# Patient Record
Sex: Female | Born: 1965
Health system: Southern US, Community
[De-identification: ages and names within clinical notes are randomized; demographics above are authoritative.]

## PROBLEM LIST (undated history)

## (undated) ENCOUNTER — Emergency Department (HOSPITAL_COMMUNITY): Admission: EM | Payer: BC Managed Care – PPO

## (undated) DIAGNOSIS — E1140 Type 2 diabetes mellitus with diabetic neuropathy, unspecified: Secondary | ICD-10-CM

## (undated) DIAGNOSIS — N186 End stage renal disease: Secondary | ICD-10-CM

## (undated) DIAGNOSIS — Z8489 Family history of other specified conditions: Secondary | ICD-10-CM

## (undated) DIAGNOSIS — I340 Nonrheumatic mitral (valve) insufficiency: Secondary | ICD-10-CM

## (undated) DIAGNOSIS — I10 Essential (primary) hypertension: Secondary | ICD-10-CM

## (undated) DIAGNOSIS — R55 Syncope and collapse: Secondary | ICD-10-CM

## (undated) DIAGNOSIS — R011 Cardiac murmur, unspecified: Secondary | ICD-10-CM

## (undated) DIAGNOSIS — K219 Gastro-esophageal reflux disease without esophagitis: Secondary | ICD-10-CM

## (undated) DIAGNOSIS — H543 Unqualified visual loss, both eyes: Secondary | ICD-10-CM

## (undated) DIAGNOSIS — K529 Noninfective gastroenteritis and colitis, unspecified: Secondary | ICD-10-CM

## (undated) HISTORY — PX: CHOLECYSTECTOMY: SHX55

## (undated) HISTORY — PX: CATARACT EXTRACTION: SUR2

## (undated) HISTORY — PX: TUBAL LIGATION: SHX77

## (undated) HISTORY — PX: EYE SURGERY: SHX253

## (undated) HISTORY — PX: GANGLION CYST EXCISION: SHX1691

## (undated) HISTORY — PX: ABDOMINAL HYSTERECTOMY: SHX81

## (undated) HISTORY — DX: Unqualified visual loss, both eyes: H54.3

## (undated) HISTORY — PX: TONSILLECTOMY: SUR1361

---

## 1997-07-05 HISTORY — PX: ABDOMINAL HYSTERECTOMY: SHX81

## 2007-05-25 ENCOUNTER — Emergency Department (HOSPITAL_COMMUNITY): Admission: EM | Admit: 2007-05-25 | Discharge: 2007-05-25 | Payer: Self-pay | Admitting: Emergency Medicine

## 2007-05-27 ENCOUNTER — Inpatient Hospital Stay (HOSPITAL_COMMUNITY): Admission: EM | Admit: 2007-05-27 | Discharge: 2007-05-31 | Payer: Self-pay | Admitting: Emergency Medicine

## 2007-05-27 ENCOUNTER — Ambulatory Visit: Payer: Self-pay | Admitting: Internal Medicine

## 2007-05-29 ENCOUNTER — Encounter: Payer: Self-pay | Admitting: Internal Medicine

## 2007-06-11 ENCOUNTER — Emergency Department (HOSPITAL_COMMUNITY): Admission: EM | Admit: 2007-06-11 | Discharge: 2007-06-11 | Payer: Self-pay | Admitting: Emergency Medicine

## 2007-06-12 ENCOUNTER — Telehealth: Payer: Self-pay | Admitting: *Deleted

## 2007-06-13 ENCOUNTER — Encounter (INDEPENDENT_AMBULATORY_CARE_PROVIDER_SITE_OTHER): Payer: Self-pay | Admitting: Internal Medicine

## 2007-06-14 ENCOUNTER — Ambulatory Visit (HOSPITAL_COMMUNITY): Admission: RE | Admit: 2007-06-14 | Discharge: 2007-06-14 | Payer: Self-pay | Admitting: Gastroenterology

## 2007-06-20 ENCOUNTER — Ambulatory Visit (HOSPITAL_COMMUNITY): Admission: RE | Admit: 2007-06-20 | Discharge: 2007-06-20 | Payer: Self-pay | Admitting: Gastroenterology

## 2007-07-10 ENCOUNTER — Encounter: Admission: RE | Admit: 2007-07-10 | Discharge: 2007-10-08 | Payer: Self-pay | Admitting: Internal Medicine

## 2007-07-28 ENCOUNTER — Emergency Department (HOSPITAL_COMMUNITY): Admission: EM | Admit: 2007-07-28 | Discharge: 2007-07-28 | Payer: Self-pay | Admitting: Emergency Medicine

## 2007-07-31 ENCOUNTER — Ambulatory Visit (HOSPITAL_COMMUNITY): Admission: RE | Admit: 2007-07-31 | Discharge: 2007-08-01 | Payer: Self-pay | Admitting: General Surgery

## 2007-07-31 ENCOUNTER — Encounter (HOSPITAL_BASED_OUTPATIENT_CLINIC_OR_DEPARTMENT_OTHER): Payer: Self-pay | Admitting: General Surgery

## 2007-08-11 ENCOUNTER — Ambulatory Visit: Payer: Self-pay | Admitting: Internal Medicine

## 2007-08-14 ENCOUNTER — Ambulatory Visit: Payer: Self-pay | Admitting: *Deleted

## 2007-09-25 ENCOUNTER — Ambulatory Visit: Payer: Self-pay | Admitting: Internal Medicine

## 2007-09-25 LAB — CONVERTED CEMR LAB
ALT: 9 units/L (ref 0–35)
AST: 9 units/L (ref 0–37)
Albumin: 4 g/dL (ref 3.5–5.2)
Alkaline Phosphatase: 95 units/L (ref 39–117)
Amphetamine Screen, Ur: NEGATIVE
BUN: 13 mg/dL (ref 6–23)
Barbiturate Quant, Ur: NEGATIVE
Basophils Absolute: 0 10*3/uL (ref 0.0–0.1)
Basophils Relative: 0 % (ref 0–1)
Benzodiazepines.: NEGATIVE
CO2: 23 meq/L (ref 19–32)
Calcium: 8.7 mg/dL (ref 8.4–10.5)
Chloride: 105 meq/L (ref 96–112)
Cocaine Metabolites: NEGATIVE
Creatinine, Ser: 0.65 mg/dL (ref 0.40–1.20)
Creatinine,U: 38.1 mg/dL
Eosinophils Absolute: 0.1 10*3/uL (ref 0.0–0.7)
Eosinophils Relative: 3 % (ref 0–5)
Glucose, Bld: 481 mg/dL — ABNORMAL HIGH (ref 70–99)
HCT: 41.2 % (ref 36.0–46.0)
Hemoglobin: 13.1 g/dL (ref 12.0–15.0)
Lymphocytes Relative: 41 % (ref 12–46)
Lymphs Abs: 2 10*3/uL (ref 0.7–4.0)
MCHC: 31.8 g/dL (ref 30.0–36.0)
MCV: 86.4 fL (ref 78.0–100.0)
Marijuana Metabolite: NEGATIVE
Methadone: NEGATIVE
Microalb, Ur: 4.54 mg/dL — ABNORMAL HIGH (ref 0.00–1.89)
Monocytes Absolute: 0.3 10*3/uL (ref 0.1–1.0)
Monocytes Relative: 6 % (ref 3–12)
Neutro Abs: 2.4 10*3/uL (ref 1.7–7.7)
Neutrophils Relative %: 51 % (ref 43–77)
Opiate Screen, Urine: NEGATIVE
Phencyclidine (PCP): NEGATIVE
Platelets: 197 10*3/uL (ref 150–400)
Potassium: 4.1 meq/L (ref 3.5–5.3)
Propoxyphene: NEGATIVE
RBC: 4.77 M/uL (ref 3.87–5.11)
RDW: 14.2 % (ref 11.5–15.5)
Sodium: 137 meq/L (ref 135–145)
TSH: 1.604 microintl units/mL (ref 0.350–5.50)
Total Bilirubin: 0.4 mg/dL (ref 0.3–1.2)
Total Protein: 6.6 g/dL (ref 6.0–8.3)
WBC: 4.8 10*3/uL (ref 4.0–10.5)

## 2007-09-26 ENCOUNTER — Encounter (INDEPENDENT_AMBULATORY_CARE_PROVIDER_SITE_OTHER): Payer: Self-pay | Admitting: Internal Medicine

## 2007-12-21 ENCOUNTER — Emergency Department (HOSPITAL_COMMUNITY): Admission: EM | Admit: 2007-12-21 | Discharge: 2007-12-22 | Payer: Self-pay | Admitting: Emergency Medicine

## 2008-02-02 ENCOUNTER — Emergency Department (HOSPITAL_COMMUNITY): Admission: EM | Admit: 2008-02-02 | Discharge: 2008-02-02 | Payer: Self-pay | Admitting: Emergency Medicine

## 2008-02-10 ENCOUNTER — Emergency Department (HOSPITAL_COMMUNITY): Admission: EM | Admit: 2008-02-10 | Discharge: 2008-02-10 | Payer: Self-pay | Admitting: Emergency Medicine

## 2008-02-22 ENCOUNTER — Ambulatory Visit: Payer: Self-pay | Admitting: Obstetrics & Gynecology

## 2008-04-04 ENCOUNTER — Emergency Department (HOSPITAL_COMMUNITY): Admission: EM | Admit: 2008-04-04 | Discharge: 2008-04-05 | Payer: Self-pay | Admitting: Emergency Medicine

## 2008-04-08 ENCOUNTER — Ambulatory Visit: Payer: Self-pay | Admitting: Internal Medicine

## 2008-04-12 ENCOUNTER — Ambulatory Visit: Payer: Self-pay | Admitting: Family Medicine

## 2008-05-18 ENCOUNTER — Emergency Department (HOSPITAL_COMMUNITY): Admission: EM | Admit: 2008-05-18 | Discharge: 2008-05-18 | Payer: Self-pay | Admitting: Emergency Medicine

## 2008-05-24 ENCOUNTER — Ambulatory Visit: Payer: Self-pay | Admitting: Internal Medicine

## 2008-07-06 ENCOUNTER — Emergency Department (HOSPITAL_COMMUNITY): Admission: EM | Admit: 2008-07-06 | Discharge: 2008-07-06 | Payer: Self-pay | Admitting: Emergency Medicine

## 2008-07-26 ENCOUNTER — Ambulatory Visit: Payer: Self-pay | Admitting: Family Medicine

## 2008-07-27 ENCOUNTER — Encounter: Payer: Self-pay | Admitting: Family Medicine

## 2008-07-27 ENCOUNTER — Ambulatory Visit (HOSPITAL_COMMUNITY): Admission: RE | Admit: 2008-07-27 | Discharge: 2008-07-27 | Payer: Self-pay | Admitting: Family Medicine

## 2008-10-16 ENCOUNTER — Emergency Department (HOSPITAL_COMMUNITY): Admission: EM | Admit: 2008-10-16 | Discharge: 2008-10-16 | Payer: Self-pay | Admitting: Emergency Medicine

## 2008-12-01 ENCOUNTER — Ambulatory Visit: Payer: Self-pay | Admitting: Family Medicine

## 2008-12-01 ENCOUNTER — Ambulatory Visit: Payer: Self-pay | Admitting: *Deleted

## 2008-12-01 ENCOUNTER — Inpatient Hospital Stay (HOSPITAL_COMMUNITY): Admission: EM | Admit: 2008-12-01 | Discharge: 2008-12-03 | Payer: Self-pay | Admitting: Emergency Medicine

## 2009-06-12 ENCOUNTER — Emergency Department (HOSPITAL_COMMUNITY): Admission: EM | Admit: 2009-06-12 | Discharge: 2009-06-12 | Payer: Self-pay | Admitting: Emergency Medicine

## 2009-09-09 ENCOUNTER — Observation Stay (HOSPITAL_COMMUNITY): Admission: EM | Admit: 2009-09-09 | Discharge: 2009-09-09 | Payer: Self-pay | Admitting: Emergency Medicine

## 2009-10-13 ENCOUNTER — Inpatient Hospital Stay (HOSPITAL_COMMUNITY): Admission: EM | Admit: 2009-10-13 | Discharge: 2009-10-17 | Payer: Self-pay | Admitting: Emergency Medicine

## 2009-10-19 ENCOUNTER — Emergency Department (HOSPITAL_COMMUNITY): Admission: EM | Admit: 2009-10-19 | Discharge: 2009-10-19 | Payer: Self-pay | Admitting: Emergency Medicine

## 2009-10-20 ENCOUNTER — Ambulatory Visit: Payer: Self-pay | Admitting: Internal Medicine

## 2009-10-20 ENCOUNTER — Inpatient Hospital Stay (HOSPITAL_COMMUNITY): Admission: EM | Admit: 2009-10-20 | Discharge: 2009-10-23 | Payer: Self-pay | Admitting: Pulmonary Disease

## 2009-10-20 ENCOUNTER — Ambulatory Visit: Payer: Self-pay | Admitting: Cardiology

## 2009-10-20 DIAGNOSIS — J90 Pleural effusion, not elsewhere classified: Secondary | ICD-10-CM | POA: Insufficient documentation

## 2009-10-22 ENCOUNTER — Encounter: Payer: Self-pay | Admitting: Pulmonary Disease

## 2009-10-23 ENCOUNTER — Encounter: Payer: Self-pay | Admitting: Emergency Medicine

## 2009-10-31 ENCOUNTER — Ambulatory Visit: Payer: Self-pay | Admitting: Internal Medicine

## 2009-10-31 DIAGNOSIS — I080 Rheumatic disorders of both mitral and aortic valves: Secondary | ICD-10-CM | POA: Insufficient documentation

## 2009-11-04 ENCOUNTER — Ambulatory Visit: Payer: Self-pay | Admitting: Cardiology

## 2009-11-04 ENCOUNTER — Inpatient Hospital Stay (HOSPITAL_COMMUNITY): Admission: EM | Admit: 2009-11-04 | Discharge: 2009-11-07 | Payer: Self-pay | Admitting: Emergency Medicine

## 2009-11-04 ENCOUNTER — Encounter (INDEPENDENT_AMBULATORY_CARE_PROVIDER_SITE_OTHER): Payer: Self-pay | Admitting: Emergency Medicine

## 2009-11-27 ENCOUNTER — Ambulatory Visit: Payer: Self-pay | Admitting: Family Medicine

## 2009-11-28 ENCOUNTER — Encounter: Payer: Self-pay | Admitting: Internal Medicine

## 2009-12-04 ENCOUNTER — Telehealth: Payer: Self-pay | Admitting: Internal Medicine

## 2009-12-10 ENCOUNTER — Encounter: Payer: Self-pay | Admitting: Internal Medicine

## 2010-08-04 NOTE — Assessment & Plan Note (Signed)
Summary: PER BRANDI/PER DR SOOD OVERBOOK/CB   Visit Type:  Follow-up Copy to:  dr Orlie Dakin Primary Provider/Referring Provider:  [facility]  CC:  HFU.  Pt states breathing is better  but not back to baseline.  States she is still having chest tightness, nonprod cough, and and SOB mainly when laying down and when walking upstairs.  .  History of Present Illness: OV 10/31/2009: Followup from hospitalization for right effusion 4/18 through 10/22/2009. In the hospital ultrasound did not show evidence of any pleural effusion worth tapping. She had diagnostic echo on 10/22/2009 and this showed new moderate mitral regurg and related pulmonary hypertension. She was diuresed (although bnp on 4/19 was 70s) and then discharged. Since discharge she states she is overall better but being bothered by new cough. She feels that this cough started during first hspitalization 10/13/2009 for Acute Pyelo. Dry cough. Rates it moderate in severity. STable since onset versus slightly better. Worse when lying down or sleeping or moving around or bending down. Relieved by rest. Associated with orthopnea (needing 3 pillows to sleep since mid-apirl), wheeze and dyspnea and bilateral edema. Denies hemoptysis or chest pain or fever or chills, or GERD or silent post nasal drainge. OV 10/31/2009: Followup from hospitalization for right effusion. In the hospital ultrasound did not show evidence of any pleural effusion worth tapping. She had diagnostic echo on 10/22/2009 and this showed new moderate mitral regurg and related pulmonary hypertension. She was diuresed (although bnp on 4/19 was 70s) and then discharged. Since dischargte  Current Medications (verified): 1)  Novolog 100 Unit/ml Soln (Insulin Aspart) .... Ssi 2)  Lantus 100 Unit/ml Soln (Insulin Glargine) .Marland Kitchen.. 15 Untis At Bedtime 3)  Simvastatin 20 Mg Tabs (Simvastatin) .... Once Daily 4)  Aspir-Low 81 Mg Tbec (Aspirin) .... Once Daily  Allergies (verified): No Known  Drug Allergies  Past History:  Family History: Last updated: 10/20/2009 Allergies, asthma Family History Asthma  Social History: Last updated: 10/20/2009 Patient is a non smoker, non drinker Patient never smoked.  Lives with 43 year old son Systems analyst in [place] - picks orders Denies passive smoking Divorced  Risk Factors: Smoking Status: never (10/20/2009)  Past Medical History: Reviewed history from 10/20/2009 and no changes required.  Insulin-dependent diabetes mellitus.   2. History of ovarian cysts.   3. Hyperlipidemia.   4. Negative Cardiolite stress test in May 2010.   5. Status post laparoscopic cholecystectomy in January 2009 per Dr.       Bubba Camp.   6. Remote hysterectomy.   Past Surgical History: Reviewed history from 10/20/2009 and no changes required. Hysterectomy, Tubal Ligation  Family History: Reviewed history from 10/20/2009 and no changes required. Allergies, asthma Family History Asthma  Social History: Reviewed history from 10/20/2009 and no changes required. Patient is a non smoker, non drinker Patient never smoked.  Lives with 42 year old son Systems analyst in [place] - picks orders Denies passive smoking Divorced  Review of Systems       The patient complains of shortness of breath with activity, non-productive cough, chest pain, irregular heartbeats, and hand/feet swelling.  The patient denies shortness of breath at rest, productive cough, coughing up blood, acid heartburn, indigestion, loss of appetite, weight change, abdominal pain, difficulty swallowing, sore throat, tooth/dental problems, headaches, nasal congestion/difficulty breathing through nose, sneezing, itching, ear ache, anxiety, depression, joint stiffness or pain, rash, change in color of mucus, and fever.    Vital Signs:  Patient profile:   45 year old  female Height:      60 inches Weight:      168.25 pounds BMI:     32.98 O2 Sat:      100  % on Room air Temp:     97.9 degrees F oral Pulse rate:   75 / minute BP sitting:   120 / 84  (left arm) Cuff size:   regular  Vitals Entered By: Raymondo Band RN (October 31, 2009 1:37 PM)  O2 Flow:  Room air CC: HFU.  Pt states breathing is better  but not back to baseline.  States she is still having chest tightness, nonprod cough, and SOB mainly when laying down and when walking upstairs.   Comments Medications reviewed with patient Daytime contact number verified with patient. Raymondo Band RN  October 31, 2009 1:37 PM    Physical Exam  General:  looks better Head:  normocephalic and atraumatic Eyes:  PERRLA/EOM intact; conjunctiva and sclera clear Ears:  TMs intact and clear with normal canals Nose:  no deformity, discharge, inflammation, or lesions Mouth:  no deformity or lesions Neck:  no masses, thyromegaly, or abnormal cervical nodes  Likely that JVP is elevated at 4cm at 45 degrees Chest Wall:  no deformities noted Lungs:  clear bilaterally to auscultation and percussion Heart:  regular rate and rhythm, S1, S2 without rubs, gallops, or clicks Pansystolic murmur present throughout Abdomen:  bowel sounds positive; abdomen soft and non-tender without masses, or organomegaly Msk:  no deformity or scoliosis noted with normal posture Pulses:  pulses normal Extremities:  no clubbing, cyanosis, edema, or deformity noted Neurologic:  CN II-XII grossly intact with normal reflexes, coordination, muscle strength and tone Skin:  intact without lesions or rashes Cervical Nodes:  no significant adenopathy Axillary Nodes:  no significant adenopathy Psych:  alert and cooperative; normal mood and affect; normal attention span and concentration   MISC. Report  Procedure date:  10/21/2009  Findings:      She had diagnostic echo on 10/22/2009 and this showed new moderate mitral regurg and related pulmonary hypertension. She was diuresed (although bnp on 4/19 was 70s  Impression &  Recommendations:  Problem # 1:  COUGH (ICD-786.2) Assessment New  I am not sure why she has cough since mid-apri. She is denying pneumonia or bronchitis symptoms, gERD and sinus drainage. Wonder if related to mitral regurg.   PLAN sgtart empiric nasal steroids for sinus drainage and PPI for gerd get sehv cards to se patient asap reasess in 4 weeks  Orders: Est. Patient Level III [id number])  Problem # 2:  MITRAL REGURGITATION (ICD-396.3) Assessment: New  This is new since 2008. She has complaints of edema although I could not appreciate it. She moight have elevated JVP atlhough BNP on 4/29 was normal. She did respond in hospital to diuresis in  terms of dyspnea. I will get her to see [facility]. She might need more invasive workup. She clearly has pan systolic murmur Her updated medication list for this problem includes:    Aspir-low 81 Mg Tbec (Aspirin) ..... Once daily  Orders: Est. Patient Level III [id number])  Problem # 3:  PLEURAL EFFUSION, RIGHT (ICD-511.9) Assessment: Unchanged Monitoir clinically Orders: Cardiology Referral (Cardiology) Est. Patient Level III [id number])  Medications Added to Medication List This Visit: 1)  Novolog 100 Unit/ml Soln (Insulin aspart) .... Ssi 2)  Aspir-low 81 Mg Tbec (Aspirin) .... Once daily 3)  Zegerid Otc 20-1100 Mg Caps (Omeprazole-sodium bicarbonate) .... Take 1 cap each morning empty stmach  4)  Fluticasone Propionate 50 Mcg/act Susp (Fluticasone propionate) .... 2 squirts each nostril daily  Other Orders: Prescription Created Electronically [phone])  Patient Instructions: 1)  we will call [facility] and get you appoitment to be seen 2)  try nasal steroid 2 squirts each nostril daily x 1 month (take 2 sample) 3)  try acid reflux samples 1 tab each morning on empty stomach for 1 month 4)  return after 1 month Prescriptions: FLUTICASONE PROPIONATE 50 MCG/ACT SUSP (FLUTICASONE PROPIONATE) 2 squirts each nostril daily  #1 x 6    Entered and Authorized by:   Brand Males MD   Signed by:   Brand Males MD on 10/31/2009   Method used:   Print then Give to Patient   [id number] ZEGERID OTC 20-1100 MG CAPS (OMEPRAZOLE-SODIUM BICARBONATE) take 1 cap each morning empty stmach  #30 x 1   Entered and Authorized by:   Brand Males MD   Signed by:   Brand Males MD on 10/31/2009   Method used:   Print then Give to Patient   [id number] FLUTICASONE PROPIONATE 50 MCG/ACT SUSP (FLUTICASONE PROPIONATE) 2 squirts each nostril daily  #1 x 6   Entered and Authorized by:   Brand Males MD   Signed by:   Brand Males MD on 10/31/2009   Method used:   Print then Give to Patient   RxIDFT:[id number] ZEGERID OTC 20-1100 MG CAPS (OMEPRAZOLE-SODIUM BICARBONATE) take 1 cap each morning empty stmach  #30 x 1   Entered and Authorized by:   Brand Males MD   Signed by:   Brand Males MD on 10/31/2009   Method used:   Print then Give to Patient   RxID:   [id number]   Appended Document: PER BRANDI/PER DR SOOD OVERBOOK/CB     Allergies: No Known Drug Allergies   Other Orders: Est. Patient Level III [id number])

## 2010-08-04 NOTE — Letter (Signed)
Summary: [facility]  [address], [place] [address]   Phone: [phone]  Fax: [phone]    12/10/2009  [name] [name] [address], [place]  [address]  Dear [name],      We have been attempting to contact you in regards to a missed appointment that was scheduled on December 03, 2009. We have been unable to reach you to reschedule this appointment. Please contact our office at your earliest convenience to take care of this matter. Thank you.         Sincerely,   Therapist, [facility]

## 2010-08-04 NOTE — Progress Notes (Signed)
Summary: nos appt  Phone Note Call from Patient   Caller: [email or web address]  Call For: Jacquel Mccamish Summary of Call: LMTCB x2 to rsc nos from 6/1. Initial call taken by: Netta Neat,  December 04, 2009 3:45 PM     Appended Document: nos appt send certified letter  Appended Document: nos appt Letter printed and given to Donavan Foil to send certified. Marland Kitchen  Appended Document: nos appt received delivery notice of certified letter. It was delivered on 12-11-09 and was signed for by Omar Person.

## 2010-08-04 NOTE — Assessment & Plan Note (Signed)
Summary: fluid in lungs/ mbw   Visit Type:  ADMIT H AND P Copy to:  dr Orlie Dakin Primary Provider/Referring Provider:  [facility]  CC:  Hospital f/u coughing X 4 days and non productive.  History of Present Illness: IOV 10/20/2009: 45 year old female. Went to ER on /05/2010 with nausea, vomitting and fever with wc 12k/94% poily. Admitted 10/13/2009 - 10/17/2009. Diagnosis was Acute E Colii Rt pyelnoephritis and discharged on 10/17/2009 on Cipro which she was instructed to continue for 1 more week . When she returned home on 10/17/2009 she noticed some new chsest heaviness. On 10/18/2009 developed new dyspnea and dry cough. Dyspnea was brought on by lying down btu relieved by sitting up. Slowly getting worse. Cough and dyspnea are both progressive and rated as moderate-severe. THerefore, reported to ER on 10/19/2009. CT chest ruled out PE but showed RT pleural effusion that is new since CXR 09/09/2009.  Was given lasix x 1 IV in ER and referred to pulmonary today. Feels she is getting worse.  Denies fever, nausea, vomit, diarrhea, chills.  Preventive Screening-Counseling & Management  Alcohol-Tobacco     Smoking Status: never   Current Medications (verified): 1)  Lancets  Misc (Lancets) .... With Each Insulin Use 2)  Novolog 100 Unit/ml Soln (Insulin Aspart)  Allergies (verified): No Known Drug Allergies  Past History:  Past Medical History:  Insulin-dependent diabetes mellitus.   2. History of ovarian cysts.   3. Hyperlipidemia.   4. Negative Cardiolite stress test in May 2010.   5. Status post laparoscopic cholecystectomy in January 2009 per Dr.       Bubba Camp.   6. Remote hysterectomy.   Past Surgical History: Hysterectomy, Tubal Ligation  Family History: Allergies, asthma Family History Asthma  Social History: Patient is a non smoker, non drinker Patient never smoked.  Lives with 85 year old son Systems analyst in [place] - picks orders Denies passive  smoking DivorcedSmoking Status:  never  Review of Systems       The patient complains of shortness of breath at rest and non-productive cough.  The patient denies shortness of breath with activity, productive cough, coughing up blood, chest pain, irregular heartbeats, acid heartburn, indigestion, loss of appetite, weight change, abdominal pain, difficulty swallowing, sore throat, tooth/dental problems, headaches, nasal congestion/difficulty breathing through nose, sneezing, itching, ear ache, anxiety, depression, hand/feet swelling, joint stiffness or pain, rash, change in color of mucus, and fever.    Vital Signs:  Patient profile:   45 year old female Height:      60 inches Weight:      174.13 pounds BMI:     34.13 O2 Sat:      95 % on Room air Temp:     98.2 degrees F oral Pulse rate:   68 / minute BP sitting:   146 / 90  (left arm) Cuff size:   regular  Vitals Entered By: Lyndee Leo, CMA (October 20, 2009 4:32 PM)  O2 Sat at Rest %:  95% O2 Flow:  Room air  Physical Exam  General:  looks fatigued coughs periodically wants to get admitted Head:  normocephalic and atraumatic Eyes:  PERRLA/EOM intact; conjunctiva and sclera clear Ears:  TMs intact and clear with normal canals Nose:  no deformity, discharge, inflammation, or lesions Mouth:  no deformity or lesions Neck:  no masses, thyromegaly, or abnormal cervical nodes Chest Wall:  no deformities noted Lungs:  decreased BS on R and dullness R base.  Heart:  regular rate and rhythm, S1, S2 without murmurs, rubs, gallops, or clicks Abdomen:  bowel sounds positive; abdomen soft and non-tender without masses, or organomegaly Msk:  no deformity or scoliosis noted with normal posture Pulses:  pulses normal Extremities:  no clubbing, cyanosis, edema, or deformity noted Neurologic:  CN II-XII grossly intact with normal reflexes, coordination, muscle strength and tone Skin:  intact without lesions or rashes Cervical Nodes:   no significant adenopathy Axillary Nodes:  no significant adenopathy Psych:  alert and cooperative; normal mood and affect; normal attention span and concentration   Impression & Recommendations:  Problem # 1:  PLEURAL EFFUSION, RIGHT (ICD-511.9) Assessment New Recent UTI. Now new Rt effusion. Symptomatic as a result. ? HAP and post pna effusion, ? reactive due to recent pyelo. REgardless needs diagnostic and thereapeutic relief. She is deconditioned, coughing and dyspneic. She prefers admission. Will admit and arrange for thora under IR guidance. Explained risks of thora (REPE, ptx, hempothorax and vasovagal). She understands. She is nervous about procedure but consents.   She will get admitted by self to the hospital. Prefers [facility] floor  Medications Added to Medication List This Visit: 1)  Novolog 100 Unit/ml Soln (Insulin aspart) 2)  Lantus 100 Unit/ml Soln (Insulin glargine) .Marland Kitchen.. 15 untis at bedtime 3)  Simvastatin 20 Mg Tabs (Simvastatin) .... Once daily 4)  Cipro 500 Mg Tabs (Ciprofloxacin hcl) .... Twice daily

## 2010-08-04 NOTE — Consult Note (Signed)
Summary: GI-Dr. Amedeo Plenty  GI-Dr. Amedeo Plenty   Imported By: Lacy Duverney 07/05/2007 [id number]  _____________________________________________________________________  External Attachment:    Type:   Image     Comment:   External Document

## 2010-08-04 NOTE — Letter (Signed)
Summary: Certified Letter Receipt  Certified Letter Receipt   Imported By: Phillis Knack 01/26/2010 14:36:33  _____________________________________________________________________  External Attachment:    Type:   Image     Comment:   External Document

## 2010-08-04 NOTE — Assessment & Plan Note (Signed)
Summary: rov 1 month////kp   Allergies: No Known Drug Allergies   Appended Document: rov 1 month////kp patient rescheduled becuase she got her appt time mistaken

## 2010-08-04 NOTE — Progress Notes (Signed)
Summary: New pt - needs ER f/up for abd pain  Phone Note Other Incoming   Summary of Call: Called by the ER to set up an ER f/up appt for this pt.  She was seen for abd pain and no objective data supported a diagnosis.  She is to see Dr. Amedeo Plenty (GI) on 06/13/07.  Please schedule an appt for the pt to be seen as a new pt since she's never been seen in our clinic before. Initial call taken by: Rico Sheehan DO,  June 12, 2007 9:56 AM  Follow-up for Phone Call        please refer to [email or web address] or [email or web address] Follow-up by: Freddy Finner RN,  June 12, 2007 12:40 PM

## 2010-09-22 LAB — CBC
HCT: 29.4 % — ABNORMAL LOW (ref 36.0–46.0)
HCT: 29.4 % — ABNORMAL LOW (ref 36.0–46.0)
HCT: 30.9 % — ABNORMAL LOW (ref 36.0–46.0)
HCT: 33 % — ABNORMAL LOW (ref 36.0–46.0)
HCT: 34.4 % — ABNORMAL LOW (ref 36.0–46.0)
HCT: 31.1 % — ABNORMAL LOW (ref 36.0–46.0)
HCT: 32.3 % — ABNORMAL LOW (ref 36.0–46.0)
HCT: 32.4 % — ABNORMAL LOW (ref 36.0–46.0)
HCT: 32.9 % — ABNORMAL LOW (ref 36.0–46.0)
Hemoglobin: 10.5 g/dL — ABNORMAL LOW (ref 12.0–15.0)
Hemoglobin: 11.1 g/dL — ABNORMAL LOW (ref 12.0–15.0)
Hemoglobin: 11.4 g/dL — ABNORMAL LOW (ref 12.0–15.0)
Hemoglobin: 9.9 g/dL — ABNORMAL LOW (ref 12.0–15.0)
Hemoglobin: 9.9 g/dL — ABNORMAL LOW (ref 12.0–15.0)
Hemoglobin: 10.4 g/dL — ABNORMAL LOW (ref 12.0–15.0)
Hemoglobin: 10.9 g/dL — ABNORMAL LOW (ref 12.0–15.0)
Hemoglobin: 11.1 g/dL — ABNORMAL LOW (ref 12.0–15.0)
Hemoglobin: 11.1 g/dL — ABNORMAL LOW (ref 12.0–15.0)
MCHC: 33.1 g/dL (ref 30.0–36.0)
MCHC: 33.6 g/dL (ref 30.0–36.0)
MCHC: 33.7 g/dL (ref 30.0–36.0)
MCHC: 33.7 g/dL (ref 30.0–36.0)
MCHC: 34 g/dL (ref 30.0–36.0)
MCHC: 33.3 g/dL (ref 30.0–36.0)
MCHC: 33.5 g/dL (ref 30.0–36.0)
MCHC: 33.7 g/dL (ref 30.0–36.0)
MCHC: 34.3 g/dL (ref 30.0–36.0)
MCV: 83.1 fL (ref 78.0–100.0)
MCV: 83.4 fL (ref 78.0–100.0)
MCV: 83.4 fL (ref 78.0–100.0)
MCV: 83.4 fL (ref 78.0–100.0)
MCV: 83.8 fL (ref 78.0–100.0)
MCV: 83.2 fL (ref 78.0–100.0)
MCV: 83.8 fL (ref 78.0–100.0)
MCV: 84 fL (ref 78.0–100.0)
MCV: 84.2 fL (ref 78.0–100.0)
Platelets: 183 10*3/uL (ref 150–400)
Platelets: 186 10*3/uL (ref 150–400)
Platelets: 205 10*3/uL (ref 150–400)
Platelets: 239 10*3/uL (ref 150–400)
Platelets: 244 10*3/uL (ref 150–400)
Platelets: 273 10*3/uL (ref 150–400)
Platelets: 333 10*3/uL (ref 150–400)
Platelets: 367 10*3/uL (ref 150–400)
Platelets: 380 10*3/uL (ref 150–400)
RBC: 3.52 MIL/uL — ABNORMAL LOW (ref 3.87–5.11)
RBC: 3.53 MIL/uL — ABNORMAL LOW (ref 3.87–5.11)
RBC: 3.71 MIL/uL — ABNORMAL LOW (ref 3.87–5.11)
RBC: 3.96 MIL/uL (ref 3.87–5.11)
RBC: 4.11 MIL/uL (ref 3.87–5.11)
RBC: 3.7 MIL/uL — ABNORMAL LOW (ref 3.87–5.11)
RBC: 3.85 MIL/uL — ABNORMAL LOW (ref 3.87–5.11)
RBC: 3.88 MIL/uL (ref 3.87–5.11)
RBC: 3.93 MIL/uL (ref 3.87–5.11)
RDW: 13.2 % (ref 11.5–15.5)
RDW: 13.3 % (ref 11.5–15.5)
RDW: 13.4 % (ref 11.5–15.5)
RDW: 13.6 % (ref 11.5–15.5)
RDW: 13.7 % (ref 11.5–15.5)
RDW: 13.6 % (ref 11.5–15.5)
RDW: 13.6 % (ref 11.5–15.5)
RDW: 13.8 % (ref 11.5–15.5)
RDW: 13.8 % (ref 11.5–15.5)
WBC: 5.8 10*3/uL (ref 4.0–10.5)
WBC: 6 10*3/uL (ref 4.0–10.5)
WBC: 6.1 10*3/uL (ref 4.0–10.5)
WBC: 6.5 10*3/uL (ref 4.0–10.5)
WBC: 6.6 10*3/uL (ref 4.0–10.5)
WBC: 10.3 10*3/uL (ref 4.0–10.5)
WBC: 10.9 10*3/uL — ABNORMAL HIGH (ref 4.0–10.5)
WBC: 11 10*3/uL — ABNORMAL HIGH (ref 4.0–10.5)
WBC: 11.2 10*3/uL — ABNORMAL HIGH (ref 4.0–10.5)

## 2010-09-22 LAB — POCT I-STAT 3, ART BLOOD GAS (G3+)
Bicarbonate: 23.7 mEq/L (ref 20.0–24.0)
O2 Saturation: 94 %
TCO2: 25 mmol/L (ref 0–100)
pCO2 arterial: 35.8 mmHg (ref 35.0–45.0)
pH, Arterial: 7.429 — ABNORMAL HIGH (ref 7.350–7.400)
pO2, Arterial: 67 mmHg — ABNORMAL LOW (ref 80.0–100.0)

## 2010-09-22 LAB — GLUCOSE, CAPILLARY
Glucose-Capillary: 103 mg/dL — ABNORMAL HIGH (ref 70–99)
Glucose-Capillary: 113 mg/dL — ABNORMAL HIGH (ref 70–99)
Glucose-Capillary: 134 mg/dL — ABNORMAL HIGH (ref 70–99)
Glucose-Capillary: 146 mg/dL — ABNORMAL HIGH (ref 70–99)
Glucose-Capillary: 146 mg/dL — ABNORMAL HIGH (ref 70–99)
Glucose-Capillary: 148 mg/dL — ABNORMAL HIGH (ref 70–99)
Glucose-Capillary: 157 mg/dL — ABNORMAL HIGH (ref 70–99)
Glucose-Capillary: 173 mg/dL — ABNORMAL HIGH (ref 70–99)
Glucose-Capillary: 176 mg/dL — ABNORMAL HIGH (ref 70–99)
Glucose-Capillary: 185 mg/dL — ABNORMAL HIGH (ref 70–99)
Glucose-Capillary: 187 mg/dL — ABNORMAL HIGH (ref 70–99)
Glucose-Capillary: 202 mg/dL — ABNORMAL HIGH (ref 70–99)
Glucose-Capillary: 204 mg/dL — ABNORMAL HIGH (ref 70–99)
Glucose-Capillary: 255 mg/dL — ABNORMAL HIGH (ref 70–99)
Glucose-Capillary: 284 mg/dL — ABNORMAL HIGH (ref 70–99)
Glucose-Capillary: 302 mg/dL — ABNORMAL HIGH (ref 70–99)
Glucose-Capillary: 41 mg/dL — CL (ref 70–99)
Glucose-Capillary: 78 mg/dL (ref 70–99)
Glucose-Capillary: 102 mg/dL — ABNORMAL HIGH (ref 70–99)
Glucose-Capillary: 136 mg/dL — ABNORMAL HIGH (ref 70–99)
Glucose-Capillary: 137 mg/dL — ABNORMAL HIGH (ref 70–99)
Glucose-Capillary: 142 mg/dL — ABNORMAL HIGH (ref 70–99)
Glucose-Capillary: 146 mg/dL — ABNORMAL HIGH (ref 70–99)
Glucose-Capillary: 161 mg/dL — ABNORMAL HIGH (ref 70–99)
Glucose-Capillary: 161 mg/dL — ABNORMAL HIGH (ref 70–99)
Glucose-Capillary: 183 mg/dL — ABNORMAL HIGH (ref 70–99)
Glucose-Capillary: 184 mg/dL — ABNORMAL HIGH (ref 70–99)
Glucose-Capillary: 198 mg/dL — ABNORMAL HIGH (ref 70–99)
Glucose-Capillary: 206 mg/dL — ABNORMAL HIGH (ref 70–99)
Glucose-Capillary: 223 mg/dL — ABNORMAL HIGH (ref 70–99)
Glucose-Capillary: 237 mg/dL — ABNORMAL HIGH (ref 70–99)
Glucose-Capillary: 252 mg/dL — ABNORMAL HIGH (ref 70–99)
Glucose-Capillary: 271 mg/dL — ABNORMAL HIGH (ref 70–99)
Glucose-Capillary: 332 mg/dL — ABNORMAL HIGH (ref 70–99)
Glucose-Capillary: 361 mg/dL — ABNORMAL HIGH (ref 70–99)
Glucose-Capillary: 397 mg/dL — ABNORMAL HIGH (ref 70–99)
Glucose-Capillary: 412 mg/dL — ABNORMAL HIGH (ref 70–99)
Glucose-Capillary: 447 mg/dL — ABNORMAL HIGH (ref 70–99)

## 2010-09-22 LAB — URINE MICROSCOPIC-ADD ON

## 2010-09-22 LAB — BASIC METABOLIC PANEL
BUN: 10 mg/dL (ref 6–23)
BUN: 11 mg/dL (ref 6–23)
BUN: 12 mg/dL (ref 6–23)
BUN: 7 mg/dL (ref 6–23)
BUN: 9 mg/dL (ref 6–23)
BUN: 2 mg/dL — ABNORMAL LOW (ref 6–23)
BUN: 3 mg/dL — ABNORMAL LOW (ref 6–23)
BUN: 8 mg/dL (ref 6–23)
CO2: 25 mEq/L (ref 19–32)
CO2: 25 mEq/L (ref 19–32)
CO2: 26 mEq/L (ref 19–32)
CO2: 26 mEq/L (ref 19–32)
CO2: 28 mEq/L (ref 19–32)
CO2: 31 mEq/L (ref 19–32)
CO2: 31 mEq/L (ref 19–32)
CO2: 31 mEq/L (ref 19–32)
Calcium: 7.7 mg/dL — ABNORMAL LOW (ref 8.4–10.5)
Calcium: 8.1 mg/dL — ABNORMAL LOW (ref 8.4–10.5)
Calcium: 8.1 mg/dL — ABNORMAL LOW (ref 8.4–10.5)
Calcium: 8.5 mg/dL (ref 8.4–10.5)
Calcium: 8.6 mg/dL (ref 8.4–10.5)
Calcium: 7.7 mg/dL — ABNORMAL LOW (ref 8.4–10.5)
Calcium: 7.8 mg/dL — ABNORMAL LOW (ref 8.4–10.5)
Calcium: 8.3 mg/dL — ABNORMAL LOW (ref 8.4–10.5)
Chloride: 102 mEq/L (ref 96–112)
Chloride: 104 mEq/L (ref 96–112)
Chloride: 106 mEq/L (ref 96–112)
Chloride: 107 mEq/L (ref 96–112)
Chloride: 110 mEq/L (ref 96–112)
Chloride: 100 mEq/L (ref 96–112)
Chloride: 95 mEq/L — ABNORMAL LOW (ref 96–112)
Chloride: 98 mEq/L (ref 96–112)
Creatinine, Ser: 0.61 mg/dL (ref 0.4–1.2)
Creatinine, Ser: 0.62 mg/dL (ref 0.4–1.2)
Creatinine, Ser: 0.65 mg/dL (ref 0.4–1.2)
Creatinine, Ser: 0.7 mg/dL (ref 0.4–1.2)
Creatinine, Ser: 0.79 mg/dL (ref 0.4–1.2)
Creatinine, Ser: 0.78 mg/dL (ref 0.4–1.2)
Creatinine, Ser: 0.79 mg/dL (ref 0.4–1.2)
Creatinine, Ser: 0.84 mg/dL (ref 0.4–1.2)
GFR calc Af Amer: >60 mL/min (ref >60)
GFR calc Af Amer: >60 mL/min (ref >60)
GFR calc Af Amer: >60 mL/min (ref >60)
GFR calc Af Amer: >60 mL/min (ref >60)
GFR calc Af Amer: >60 mL/min (ref >60)
GFR calc Af Amer: >60 mL/min (ref >60)
GFR calc Af Amer: >60 mL/min (ref >60)
GFR calc non Af Amer: >60 mL/min (ref >60)
GFR calc non Af Amer: >60 mL/min (ref >60)
GFR calc non Af Amer: >60 mL/min (ref >60)
GFR calc non Af Amer: >60 mL/min (ref >60)
GFR calc non Af Amer: >60 mL/min (ref >60)
GFR calc non Af Amer: >60 mL/min (ref >60)
GFR calc non Af Amer: >60 mL/min (ref >60)
GFR calc non Af Amer: >60 mL/min (ref >60)
Glucose, Bld: 114 mg/dL — ABNORMAL HIGH (ref 70–99)
Glucose, Bld: 148 mg/dL — ABNORMAL HIGH (ref 70–99)
Glucose, Bld: 218 mg/dL — ABNORMAL HIGH (ref 70–99)
Glucose, Bld: 251 mg/dL — ABNORMAL HIGH (ref 70–99)
Glucose, Bld: 387 mg/dL — ABNORMAL HIGH (ref 70–99)
Glucose, Bld: 363 mg/dL — ABNORMAL HIGH (ref 70–99)
Glucose, Bld: 367 mg/dL — ABNORMAL HIGH (ref 70–99)
Glucose, Bld: 405 mg/dL — ABNORMAL HIGH (ref 70–99)
Potassium: 3.1 mEq/L — ABNORMAL LOW (ref 3.5–5.1)
Potassium: 3.2 mEq/L — ABNORMAL LOW (ref 3.5–5.1)
Potassium: 3.2 mEq/L — ABNORMAL LOW (ref 3.5–5.1)
Potassium: 3.4 mEq/L — ABNORMAL LOW (ref 3.5–5.1)
Potassium: 3.5 mEq/L (ref 3.5–5.1)
Potassium: 2.8 mEq/L — ABNORMAL LOW (ref 3.5–5.1)
Potassium: 3 mEq/L — ABNORMAL LOW (ref 3.5–5.1)
Potassium: 4 mEq/L (ref 3.5–5.1)
Sodium: 133 mEq/L — ABNORMAL LOW (ref 135–145)
Sodium: 135 mEq/L (ref 135–145)
Sodium: 136 mEq/L (ref 135–145)
Sodium: 139 mEq/L (ref 135–145)
Sodium: 141 mEq/L (ref 135–145)
Sodium: 134 mEq/L — ABNORMAL LOW (ref 135–145)
Sodium: 135 mEq/L (ref 135–145)
Sodium: 136 mEq/L (ref 135–145)

## 2010-09-22 LAB — CARDIAC PANEL(CRET KIN+CKTOT+MB+TROPI)
CK, MB: 0.4 ng/mL (ref 0.3–4.0)
CK, MB: 0.6 ng/mL (ref 0.3–4.0)
CK, MB: 0.6 ng/mL (ref 0.3–4.0)
CK, MB: 0.8 ng/mL (ref 0.3–4.0)
Relative Index: INVALID (ref 0.0–2.5)
Relative Index: INVALID (ref 0.0–2.5)
Relative Index: INVALID (ref 0.0–2.5)
Relative Index: INVALID (ref 0.0–2.5)
Total CK: 22 U/L (ref 7–177)
Total CK: 22 U/L (ref 7–177)
Total CK: 18 U/L (ref 7–177)
Total CK: 18 U/L (ref 7–177)
Troponin I: 0.01 ng/mL (ref 0.00–0.06)
Troponin I: 0.01 ng/mL (ref 0.00–0.06)
Troponin I: 0.02 ng/mL (ref 0.00–0.06)
Troponin I: 0.03 ng/mL (ref 0.00–0.06)

## 2010-09-22 LAB — URINALYSIS, ROUTINE W REFLEX MICROSCOPIC
Bilirubin Urine: NEGATIVE
Bilirubin Urine: NEGATIVE
Glucose, UA: NEGATIVE mg/dL
Glucose, UA: >1000 mg/dL — AB
Ketones, ur: NEGATIVE mg/dL
Ketones, ur: NEGATIVE mg/dL
Nitrite: NEGATIVE
Nitrite: NEGATIVE
Protein, ur: NEGATIVE mg/dL
Protein, ur: NEGATIVE mg/dL
Specific Gravity, Urine: 1.007 (ref 1.005–1.030)
Specific Gravity, Urine: 1.016 (ref 1.005–1.030)
Urobilinogen, UA: 0.2 mg/dL (ref 0.0–1.0)
Urobilinogen, UA: 0.2 mg/dL (ref 0.0–1.0)
pH: 6.5 (ref 5.0–8.0)
pH: 7 (ref 5.0–8.0)

## 2010-09-22 LAB — POCT I-STAT 3, VENOUS BLOOD GAS (G3P V)
Bicarbonate: 24.9 mEq/L — ABNORMAL HIGH (ref 20.0–24.0)
O2 Saturation: 64 %
TCO2: 26 mmol/L (ref 0–100)
pCO2, Ven: 41.2 mmHg — ABNORMAL LOW (ref 45.0–50.0)
pH, Ven: 7.39 — ABNORMAL HIGH (ref 7.250–7.300)
pO2, Ven: 33 mmHg (ref 30.0–45.0)

## 2010-09-22 LAB — POCT CARDIAC MARKERS
CKMB, poc: <1 ng/mL — ABNORMAL LOW (ref 1.0–8.0)
CKMB, poc: <1 ng/mL — ABNORMAL LOW (ref 1.0–8.0)
CKMB, poc: <1 ng/mL — ABNORMAL LOW (ref 1.0–8.0)
Myoglobin, poc: 20.9 ng/mL (ref 12–200)
Myoglobin, poc: 31.6 ng/mL (ref 12–200)
Myoglobin, poc: 47.7 ng/mL (ref 12–200)
Troponin i, poc: <0.05 ng/mL (ref 0.00–0.09)
Troponin i, poc: <0.05 ng/mL (ref 0.00–0.09)
Troponin i, poc: <0.05 ng/mL (ref 0.00–0.09)

## 2010-09-22 LAB — COMPREHENSIVE METABOLIC PANEL
ALT: 18 U/L (ref 0–35)
AST: 15 U/L (ref 0–37)
Albumin: 2.3 g/dL — ABNORMAL LOW (ref 3.5–5.2)
Alkaline Phosphatase: 120 U/L — ABNORMAL HIGH (ref 39–117)
BUN: 11 mg/dL (ref 6–23)
CO2: 29 mEq/L (ref 19–32)
Calcium: 8 mg/dL — ABNORMAL LOW (ref 8.4–10.5)
Chloride: 99 mEq/L (ref 96–112)
Creatinine, Ser: 0.73 mg/dL (ref 0.4–1.2)
GFR calc Af Amer: >60 mL/min (ref >60)
GFR calc non Af Amer: >60 mL/min (ref >60)
Glucose, Bld: 268 mg/dL — ABNORMAL HIGH (ref 70–99)
Potassium: 3.8 mEq/L (ref 3.5–5.1)
Sodium: 136 mEq/L (ref 135–145)
Total Bilirubin: 0.4 mg/dL (ref 0.3–1.2)
Total Protein: 6.3 g/dL (ref 6.0–8.3)

## 2010-09-22 LAB — LIPID PANEL
Cholesterol: 165 mg/dL (ref 0–200)
Cholesterol: 86 mg/dL (ref 0–200)
HDL: 43 mg/dL (ref >39)
HDL: 15 mg/dL — ABNORMAL LOW (ref >39)
LDL Cholesterol: 99 mg/dL (ref 0–99)
LDL Cholesterol: 47 mg/dL (ref 0–99)
Total CHOL/HDL Ratio: 3.8 RATIO
Total CHOL/HDL Ratio: 5.7 RATIO
Triglycerides: 113 mg/dL (ref <150)
Triglycerides: 120 mg/dL (ref <150)
VLDL: 23 mg/dL (ref 0–40)
VLDL: 24 mg/dL (ref 0–40)

## 2010-09-22 LAB — DIFFERENTIAL
Basophils Absolute: 0.1 10*3/uL (ref 0.0–0.1)
Basophils Absolute: 0.1 10*3/uL (ref 0.0–0.1)
Basophils Absolute: 0.1 10*3/uL (ref 0.0–0.1)
Basophils Relative: 1 % (ref 0–1)
Basophils Relative: 1 % (ref 0–1)
Basophils Relative: 1 % (ref 0–1)
Eosinophils Absolute: 0.2 10*3/uL (ref 0.0–0.7)
Eosinophils Absolute: 0.2 10*3/uL (ref 0.0–0.7)
Eosinophils Absolute: 0.2 10*3/uL (ref 0.0–0.7)
Eosinophils Relative: 4 % (ref 0–5)
Eosinophils Relative: 2 % (ref 0–5)
Eosinophils Relative: 2 % (ref 0–5)
Lymphocytes Relative: 43 % (ref 12–46)
Lymphocytes Relative: 17 % (ref 12–46)
Lymphocytes Relative: 17 % (ref 12–46)
Lymphs Abs: 2.6 10*3/uL (ref 0.7–4.0)
Lymphs Abs: 1.9 10*3/uL (ref 0.7–4.0)
Lymphs Abs: 1.9 10*3/uL (ref 0.7–4.0)
Monocytes Absolute: 0.4 10*3/uL (ref 0.1–1.0)
Monocytes Absolute: 0.8 10*3/uL (ref 0.1–1.0)
Monocytes Absolute: 0.9 10*3/uL (ref 0.1–1.0)
Monocytes Relative: 7 % (ref 3–12)
Monocytes Relative: 7 % (ref 3–12)
Monocytes Relative: 8 % (ref 3–12)
Neutro Abs: 2.8 10*3/uL (ref 1.7–7.7)
Neutro Abs: 8 10*3/uL — ABNORMAL HIGH (ref 1.7–7.7)
Neutro Abs: 8.1 10*3/uL — ABNORMAL HIGH (ref 1.7–7.7)
Neutrophils Relative %: 46 % (ref 43–77)
Neutrophils Relative %: 72 % (ref 43–77)
Neutrophils Relative %: 73 % (ref 43–77)

## 2010-09-22 LAB — APTT
aPTT: 26 seconds (ref 24–37)
aPTT: 27 seconds (ref 24–37)
aPTT: 29 seconds (ref 24–37)
aPTT: 27 seconds (ref 24–37)

## 2010-09-22 LAB — MAGNESIUM
Magnesium: 2.1 mg/dL (ref 1.5–2.5)
Magnesium: 2 mg/dL (ref 1.5–2.5)

## 2010-09-22 LAB — PROTIME-INR
INR: 1.01 (ref 0.00–1.49)
INR: 1.07 (ref 0.00–1.49)
INR: 1.07 (ref 0.00–1.49)
INR: 1.14 (ref 0.00–1.49)
Prothrombin Time: 13.2 seconds (ref 11.6–15.2)
Prothrombin Time: 13.8 seconds (ref 11.6–15.2)
Prothrombin Time: 13.8 seconds (ref 11.6–15.2)
Prothrombin Time: 14.5 seconds (ref 11.6–15.2)

## 2010-09-22 LAB — HEPATIC FUNCTION PANEL
ALT: 11 U/L (ref 0–35)
AST: 13 U/L (ref 0–37)
Albumin: 3.1 g/dL — ABNORMAL LOW (ref 3.5–5.2)
Alkaline Phosphatase: 94 U/L (ref 39–117)
Bilirubin, Direct: <0.1 mg/dL (ref 0.0–0.3)
Total Bilirubin: 0.6 mg/dL (ref 0.3–1.2)
Total Protein: 6.8 g/dL (ref 6.0–8.3)

## 2010-09-22 LAB — CROSSMATCH
ABO/RH(D): O POS
Antibody Screen: NEGATIVE

## 2010-09-22 LAB — SEDIMENTATION RATE
Sed Rate: 77 mm/hr — ABNORMAL HIGH (ref 0–22)
Sed Rate: 80 mm/hr — ABNORMAL HIGH (ref 0–22)

## 2010-09-22 LAB — URINE CULTURE
Colony Count: NO GROWTH
Culture: NO GROWTH

## 2010-09-22 LAB — CK TOTAL AND CKMB (NOT AT ARMC)
CK, MB: 0.4 ng/mL (ref 0.3–4.0)
Total CK: 26 U/L (ref 7–177)

## 2010-09-22 LAB — LACTATE DEHYDROGENASE: LDH: 220 U/L (ref 94–250)

## 2010-09-22 LAB — BRAIN NATRIURETIC PEPTIDE
Pro B Natriuretic peptide (BNP): 31 pg/mL (ref 0.0–100.0)
Pro B Natriuretic peptide (BNP): 79 pg/mL (ref 0.0–100.0)

## 2010-09-22 LAB — PROTEIN, TOTAL: Total Protein: 6.3 g/dL (ref 6.0–8.3)

## 2010-09-22 LAB — TSH
TSH: 0.87 u[IU]/mL (ref 0.350–4.500)
TSH: 0.998 u[IU]/mL (ref 0.350–4.500)

## 2010-09-22 LAB — HEMOGLOBIN A1C
Hgb A1c MFr Bld: 13.8 % — ABNORMAL HIGH (ref <5.7)
Mean Plasma Glucose: 349 mg/dL — ABNORMAL HIGH (ref <117)

## 2010-09-22 LAB — ANA: Anti Nuclear Antibody(ANA): NEGATIVE

## 2010-09-22 LAB — TROPONIN I: Troponin I: 0.01 ng/mL (ref 0.00–0.06)

## 2010-09-22 LAB — ALBUMIN: Albumin: 2.3 g/dL — ABNORMAL LOW (ref 3.5–5.2)

## 2010-09-22 LAB — ABO/RH: ABO/RH(D): O POS

## 2010-09-22 LAB — LIPASE, BLOOD: Lipase: 24 U/L (ref 11–59)

## 2010-09-22 LAB — AMYLASE: Amylase: 31 U/L (ref 0–105)

## 2010-09-22 LAB — ANTI-NEUTROPHIL ANTIBODY

## 2010-09-22 LAB — PHOSPHORUS: Phosphorus: 3.4 mg/dL (ref 2.3–4.6)

## 2010-09-23 LAB — CBC
HCT: 31.5 % — ABNORMAL LOW (ref 36.0–46.0)
HCT: 33.8 % — ABNORMAL LOW (ref 36.0–46.0)
HCT: 34.1 % — ABNORMAL LOW (ref 36.0–46.0)
HCT: 40.3 % (ref 36.0–46.0)
Hemoglobin: 10.7 g/dL — ABNORMAL LOW (ref 12.0–15.0)
Hemoglobin: 11.4 g/dL — ABNORMAL LOW (ref 12.0–15.0)
Hemoglobin: 11.6 g/dL — ABNORMAL LOW (ref 12.0–15.0)
Hemoglobin: 13.5 g/dL (ref 12.0–15.0)
MCHC: 33.4 g/dL (ref 30.0–36.0)
MCHC: 33.7 g/dL (ref 30.0–36.0)
MCHC: 33.9 g/dL (ref 30.0–36.0)
MCHC: 34.1 g/dL (ref 30.0–36.0)
MCV: 83.4 fL (ref 78.0–100.0)
MCV: 83.8 fL (ref 78.0–100.0)
MCV: 84.3 fL (ref 78.0–100.0)
MCV: 84.6 fL (ref 78.0–100.0)
Platelets: 131 10*3/uL — ABNORMAL LOW (ref 150–400)
Platelets: 131 10*3/uL — ABNORMAL LOW (ref 150–400)
Platelets: 138 10*3/uL — ABNORMAL LOW (ref 150–400)
Platelets: 142 10*3/uL — ABNORMAL LOW (ref 150–400)
RBC: 3.77 MIL/uL — ABNORMAL LOW (ref 3.87–5.11)
RBC: 4.01 MIL/uL (ref 3.87–5.11)
RBC: 4.07 MIL/uL (ref 3.87–5.11)
RBC: 4.77 MIL/uL (ref 3.87–5.11)
RDW: 13.2 % (ref 11.5–15.5)
RDW: 13.4 % (ref 11.5–15.5)
RDW: 13.5 % (ref 11.5–15.5)
RDW: 13.9 % (ref 11.5–15.5)
WBC: 12.1 10*3/uL — ABNORMAL HIGH (ref 4.0–10.5)
WBC: 14.6 10*3/uL — ABNORMAL HIGH (ref 4.0–10.5)
WBC: 15.2 10*3/uL — ABNORMAL HIGH (ref 4.0–10.5)
WBC: 16.6 10*3/uL — ABNORMAL HIGH (ref 4.0–10.5)

## 2010-09-23 LAB — COMPREHENSIVE METABOLIC PANEL
ALT: 14 U/L (ref 0–35)
ALT: 14 U/L (ref 0–35)
ALT: 15 U/L (ref 0–35)
AST: 17 U/L (ref 0–37)
AST: 18 U/L (ref 0–37)
AST: 21 U/L (ref 0–37)
Albumin: 2.3 g/dL — ABNORMAL LOW (ref 3.5–5.2)
Albumin: 2.3 g/dL — ABNORMAL LOW (ref 3.5–5.2)
Albumin: 3 g/dL — ABNORMAL LOW (ref 3.5–5.2)
Alkaline Phosphatase: 104 U/L (ref 39–117)
Alkaline Phosphatase: 109 U/L (ref 39–117)
Alkaline Phosphatase: 208 U/L — ABNORMAL HIGH (ref 39–117)
BUN: 10 mg/dL (ref 6–23)
BUN: 13 mg/dL (ref 6–23)
BUN: 7 mg/dL (ref 6–23)
CO2: 20 mEq/L (ref 19–32)
CO2: 22 mEq/L (ref 19–32)
CO2: 23 mEq/L (ref 19–32)
Calcium: 7.4 mg/dL — ABNORMAL LOW (ref 8.4–10.5)
Calcium: 7.6 mg/dL — ABNORMAL LOW (ref 8.4–10.5)
Calcium: 8.4 mg/dL (ref 8.4–10.5)
Chloride: 106 mEq/L (ref 96–112)
Chloride: 107 mEq/L (ref 96–112)
Chloride: 96 mEq/L (ref 96–112)
Creatinine, Ser: 0.77 mg/dL (ref 0.4–1.2)
Creatinine, Ser: 0.78 mg/dL (ref 0.4–1.2)
Creatinine, Ser: 1.13 mg/dL (ref 0.4–1.2)
GFR calc Af Amer: >60 mL/min (ref >60)
GFR calc Af Amer: >60 mL/min (ref >60)
GFR calc Af Amer: >60 mL/min (ref >60)
GFR calc non Af Amer: 53 mL/min — ABNORMAL LOW (ref >60)
GFR calc non Af Amer: >60 mL/min (ref >60)
GFR calc non Af Amer: >60 mL/min (ref >60)
Glucose, Bld: 148 mg/dL — ABNORMAL HIGH (ref 70–99)
Glucose, Bld: 180 mg/dL — ABNORMAL HIGH (ref 70–99)
Glucose, Bld: 413 mg/dL — ABNORMAL HIGH (ref 70–99)
Potassium: 2.7 mEq/L — CL (ref 3.5–5.1)
Potassium: 3.1 mEq/L — ABNORMAL LOW (ref 3.5–5.1)
Potassium: 3.3 mEq/L — ABNORMAL LOW (ref 3.5–5.1)
Sodium: 133 mEq/L — ABNORMAL LOW (ref 135–145)
Sodium: 135 mEq/L (ref 135–145)
Sodium: 137 mEq/L (ref 135–145)
Total Bilirubin: 0.6 mg/dL (ref 0.3–1.2)
Total Bilirubin: 0.7 mg/dL (ref 0.3–1.2)
Total Bilirubin: 1.4 mg/dL — ABNORMAL HIGH (ref 0.3–1.2)
Total Protein: 5.6 g/dL — ABNORMAL LOW (ref 6.0–8.3)
Total Protein: 5.6 g/dL — ABNORMAL LOW (ref 6.0–8.3)
Total Protein: 6.8 g/dL (ref 6.0–8.3)

## 2010-09-23 LAB — URINE MICROSCOPIC-ADD ON

## 2010-09-23 LAB — DIFFERENTIAL
Basophils Absolute: 0 10*3/uL (ref 0.0–0.1)
Basophils Absolute: 0 10*3/uL (ref 0.0–0.1)
Basophils Absolute: 0 10*3/uL (ref 0.0–0.1)
Basophils Relative: 0 % (ref 0–1)
Basophils Relative: 0 % (ref 0–1)
Basophils Relative: 0 % (ref 0–1)
Eosinophils Absolute: 0 10*3/uL (ref 0.0–0.7)
Eosinophils Absolute: 0 10*3/uL (ref 0.0–0.7)
Eosinophils Absolute: 0.1 10*3/uL (ref 0.0–0.7)
Eosinophils Relative: 0 % (ref 0–5)
Eosinophils Relative: 0 % (ref 0–5)
Eosinophils Relative: 1 % (ref 0–5)
Lymphocytes Relative: 2 % — ABNORMAL LOW (ref 12–46)
Lymphocytes Relative: 5 % — ABNORMAL LOW (ref 12–46)
Lymphocytes Relative: 6 % — ABNORMAL LOW (ref 12–46)
Lymphs Abs: 0.2 10*3/uL — ABNORMAL LOW (ref 0.7–4.0)
Lymphs Abs: 0.8 10*3/uL (ref 0.7–4.0)
Lymphs Abs: 0.9 10*3/uL (ref 0.7–4.0)
Monocytes Absolute: 0.5 10*3/uL (ref 0.1–1.0)
Monocytes Absolute: 1.7 10*3/uL — ABNORMAL HIGH (ref 0.1–1.0)
Monocytes Absolute: 1.7 10*3/uL — ABNORMAL HIGH (ref 0.1–1.0)
Monocytes Relative: 10 % (ref 3–12)
Monocytes Relative: 12 % (ref 3–12)
Monocytes Relative: 4 % (ref 3–12)
Neutro Abs: 11.4 10*3/uL — ABNORMAL HIGH (ref 1.7–7.7)
Neutro Abs: 11.8 10*3/uL — ABNORMAL HIGH (ref 1.7–7.7)
Neutro Abs: 14.1 10*3/uL — ABNORMAL HIGH (ref 1.7–7.7)
Neutrophils Relative %: 81 % — ABNORMAL HIGH (ref 43–77)
Neutrophils Relative %: 85 % — ABNORMAL HIGH (ref 43–77)
Neutrophils Relative %: 94 % — ABNORMAL HIGH (ref 43–77)
Smear Review: DECREASED

## 2010-09-23 LAB — GLUCOSE, CAPILLARY
Glucose-Capillary: 112 mg/dL — ABNORMAL HIGH (ref 70–99)
Glucose-Capillary: 124 mg/dL — ABNORMAL HIGH (ref 70–99)
Glucose-Capillary: 127 mg/dL — ABNORMAL HIGH (ref 70–99)
Glucose-Capillary: 150 mg/dL — ABNORMAL HIGH (ref 70–99)
Glucose-Capillary: 151 mg/dL — ABNORMAL HIGH (ref 70–99)
Glucose-Capillary: 158 mg/dL — ABNORMAL HIGH (ref 70–99)
Glucose-Capillary: 160 mg/dL — ABNORMAL HIGH (ref 70–99)
Glucose-Capillary: 160 mg/dL — ABNORMAL HIGH (ref 70–99)
Glucose-Capillary: 160 mg/dL — ABNORMAL HIGH (ref 70–99)
Glucose-Capillary: 168 mg/dL — ABNORMAL HIGH (ref 70–99)
Glucose-Capillary: 172 mg/dL — ABNORMAL HIGH (ref 70–99)
Glucose-Capillary: 174 mg/dL — ABNORMAL HIGH (ref 70–99)
Glucose-Capillary: 175 mg/dL — ABNORMAL HIGH (ref 70–99)
Glucose-Capillary: 176 mg/dL — ABNORMAL HIGH (ref 70–99)
Glucose-Capillary: 178 mg/dL — ABNORMAL HIGH (ref 70–99)
Glucose-Capillary: 178 mg/dL — ABNORMAL HIGH (ref 70–99)
Glucose-Capillary: 181 mg/dL — ABNORMAL HIGH (ref 70–99)
Glucose-Capillary: 183 mg/dL — ABNORMAL HIGH (ref 70–99)
Glucose-Capillary: 185 mg/dL — ABNORMAL HIGH (ref 70–99)
Glucose-Capillary: 189 mg/dL — ABNORMAL HIGH (ref 70–99)
Glucose-Capillary: 190 mg/dL — ABNORMAL HIGH (ref 70–99)
Glucose-Capillary: 195 mg/dL — ABNORMAL HIGH (ref 70–99)
Glucose-Capillary: 196 mg/dL — ABNORMAL HIGH (ref 70–99)
Glucose-Capillary: 203 mg/dL — ABNORMAL HIGH (ref 70–99)
Glucose-Capillary: 204 mg/dL — ABNORMAL HIGH (ref 70–99)
Glucose-Capillary: 207 mg/dL — ABNORMAL HIGH (ref 70–99)
Glucose-Capillary: 213 mg/dL — ABNORMAL HIGH (ref 70–99)
Glucose-Capillary: 216 mg/dL — ABNORMAL HIGH (ref 70–99)
Glucose-Capillary: 221 mg/dL — ABNORMAL HIGH (ref 70–99)
Glucose-Capillary: 224 mg/dL — ABNORMAL HIGH (ref 70–99)
Glucose-Capillary: 227 mg/dL — ABNORMAL HIGH (ref 70–99)
Glucose-Capillary: 246 mg/dL — ABNORMAL HIGH (ref 70–99)
Glucose-Capillary: 259 mg/dL — ABNORMAL HIGH (ref 70–99)
Glucose-Capillary: 331 mg/dL — ABNORMAL HIGH (ref 70–99)
Glucose-Capillary: 386 mg/dL — ABNORMAL HIGH (ref 70–99)
Glucose-Capillary: 407 mg/dL — ABNORMAL HIGH (ref 70–99)

## 2010-09-23 LAB — URINALYSIS, ROUTINE W REFLEX MICROSCOPIC
Bilirubin Urine: NEGATIVE
Bilirubin Urine: NEGATIVE
Glucose, UA: >1000 mg/dL — AB
Glucose, UA: >1000 mg/dL — AB
Ketones, ur: >80 mg/dL — AB
Ketones, ur: >80 mg/dL — AB
Nitrite: NEGATIVE
Nitrite: POSITIVE — AB
Protein, ur: 100 mg/dL — AB
Protein, ur: 30 mg/dL — AB
Specific Gravity, Urine: 1.026 (ref 1.005–1.030)
Specific Gravity, Urine: 1.027 (ref 1.005–1.030)
Urobilinogen, UA: 1 mg/dL (ref 0.0–1.0)
Urobilinogen, UA: 1 mg/dL (ref 0.0–1.0)
pH: 5.5 (ref 5.0–8.0)
pH: 5.5 (ref 5.0–8.0)

## 2010-09-23 LAB — CULTURE, BLOOD (ROUTINE X 2)
Culture: NO GROWTH
Culture: NO GROWTH

## 2010-09-23 LAB — LIPID PANEL
Cholesterol: 124 mg/dL (ref 0–200)
HDL: 24 mg/dL — ABNORMAL LOW (ref >39)
LDL Cholesterol: 73 mg/dL (ref 0–99)
Total CHOL/HDL Ratio: 5.2 RATIO
Triglycerides: 136 mg/dL (ref <150)
VLDL: 27 mg/dL (ref 0–40)

## 2010-09-23 LAB — HEMOGLOBIN A1C
Hgb A1c MFr Bld: 16.4 % — ABNORMAL HIGH (ref 4.6–6.1)
Mean Plasma Glucose: 424 mg/dL

## 2010-09-23 LAB — CARDIAC PANEL(CRET KIN+CKTOT+MB+TROPI)
CK, MB: 1.6 ng/mL (ref 0.3–4.0)
CK, MB: 1.7 ng/mL (ref 0.3–4.0)
CK, MB: 2 ng/mL (ref 0.3–4.0)
Relative Index: INVALID (ref 0.0–2.5)
Relative Index: INVALID (ref 0.0–2.5)
Relative Index: INVALID (ref 0.0–2.5)
Total CK: 47 U/L (ref 7–177)
Total CK: 62 U/L (ref 7–177)
Total CK: 69 U/L (ref 7–177)
Troponin I: 0.04 ng/mL (ref 0.00–0.06)
Troponin I: 0.08 ng/mL — ABNORMAL HIGH (ref 0.00–0.06)
Troponin I: 0.08 ng/mL — ABNORMAL HIGH (ref 0.00–0.06)

## 2010-09-23 LAB — MAGNESIUM: Magnesium: 2 mg/dL (ref 1.5–2.5)

## 2010-09-23 LAB — URINE CULTURE: Colony Count: 75000

## 2010-09-23 LAB — BASIC METABOLIC PANEL
BUN: 5 mg/dL — ABNORMAL LOW (ref 6–23)
CO2: 22 mEq/L (ref 19–32)
Calcium: 7.5 mg/dL — ABNORMAL LOW (ref 8.4–10.5)
Chloride: 106 mEq/L (ref 96–112)
Creatinine, Ser: 0.79 mg/dL (ref 0.4–1.2)
GFR calc Af Amer: >60 mL/min (ref >60)
GFR calc non Af Amer: >60 mL/min (ref >60)
Glucose, Bld: 191 mg/dL — ABNORMAL HIGH (ref 70–99)
Potassium: 3 mEq/L — ABNORMAL LOW (ref 3.5–5.1)
Sodium: 137 mEq/L (ref 135–145)

## 2010-09-23 LAB — LACTIC ACID, PLASMA: Lactic Acid, Venous: 1.5 mmol/L (ref 0.5–2.2)

## 2010-09-27 LAB — COMPREHENSIVE METABOLIC PANEL
ALT: 14 U/L (ref 0–35)
AST: 17 U/L (ref 0–37)
Albumin: 3.6 g/dL (ref 3.5–5.2)
Alkaline Phosphatase: 107 U/L (ref 39–117)
BUN: 10 mg/dL (ref 6–23)
CO2: 23 mEq/L (ref 19–32)
Calcium: 8.6 mg/dL (ref 8.4–10.5)
Chloride: 104 mEq/L (ref 96–112)
Creatinine, Ser: 0.57 mg/dL (ref 0.4–1.2)
GFR calc Af Amer: >60 mL/min (ref >60)
GFR calc non Af Amer: >60 mL/min (ref >60)
Glucose, Bld: 315 mg/dL — ABNORMAL HIGH (ref 70–99)
Potassium: 3.6 mEq/L (ref 3.5–5.1)
Sodium: 135 mEq/L (ref 135–145)
Total Bilirubin: 0.8 mg/dL (ref 0.3–1.2)
Total Protein: 7.2 g/dL (ref 6.0–8.3)

## 2010-09-27 LAB — CBC
HCT: 44.2 % (ref 36.0–46.0)
Hemoglobin: 14.7 g/dL (ref 12.0–15.0)
MCHC: 33.3 g/dL (ref 30.0–36.0)
MCV: 84.4 fL (ref 78.0–100.0)
Platelets: 153 10*3/uL (ref 150–400)
RBC: 5.24 MIL/uL — ABNORMAL HIGH (ref 3.87–5.11)
RDW: 13.7 % (ref 11.5–15.5)
WBC: 6 10*3/uL (ref 4.0–10.5)

## 2010-09-27 LAB — URINALYSIS, ROUTINE W REFLEX MICROSCOPIC
Bilirubin Urine: NEGATIVE
Glucose, UA: >1000 mg/dL — AB
Hgb urine dipstick: NEGATIVE
Ketones, ur: 15 mg/dL — AB
Leukocytes, UA: NEGATIVE
Nitrite: NEGATIVE
Protein, ur: NEGATIVE mg/dL
Specific Gravity, Urine: 1.044 — ABNORMAL HIGH (ref 1.005–1.030)
Urobilinogen, UA: 0.2 mg/dL (ref 0.0–1.0)
pH: 5.5 (ref 5.0–8.0)

## 2010-09-27 LAB — POCT PREGNANCY, URINE: Preg Test, Ur: NEGATIVE

## 2010-09-27 LAB — URINE MICROSCOPIC-ADD ON

## 2010-09-27 LAB — POCT CARDIAC MARKERS
CKMB, poc: <1 ng/mL — ABNORMAL LOW (ref 1.0–8.0)
Myoglobin, poc: 49.8 ng/mL (ref 12–200)
Troponin i, poc: <0.05 ng/mL (ref 0.00–0.09)

## 2010-09-27 LAB — URINE CULTURE
Colony Count: NO GROWTH
Culture: NO GROWTH

## 2010-09-27 LAB — GLUCOSE, CAPILLARY
Glucose-Capillary: 205 mg/dL — ABNORMAL HIGH (ref 70–99)
Glucose-Capillary: 299 mg/dL — ABNORMAL HIGH (ref 70–99)

## 2010-09-27 LAB — LIPASE, BLOOD: Lipase: 24 U/L (ref 11–59)

## 2010-10-06 LAB — DIFFERENTIAL
Basophils Absolute: 0 10*3/uL (ref 0.0–0.1)
Basophils Relative: 0 % (ref 0–1)
Eosinophils Absolute: 0.1 10*3/uL (ref 0.0–0.7)
Eosinophils Relative: 1 % (ref 0–5)
Lymphocytes Relative: 25 % (ref 12–46)
Lymphs Abs: 2.2 10*3/uL (ref 0.7–4.0)
Monocytes Absolute: 0.6 10*3/uL (ref 0.1–1.0)
Monocytes Relative: 7 % (ref 3–12)
Neutro Abs: 5.8 10*3/uL (ref 1.7–7.7)
Neutrophils Relative %: 67 % (ref 43–77)

## 2010-10-06 LAB — COMPREHENSIVE METABOLIC PANEL
ALT: 13 U/L (ref 0–35)
AST: 15 U/L (ref 0–37)
Albumin: 3.9 g/dL (ref 3.5–5.2)
Alkaline Phosphatase: 105 U/L (ref 39–117)
BUN: 13 mg/dL (ref 6–23)
CO2: 22 mEq/L (ref 19–32)
Calcium: 8.6 mg/dL (ref 8.4–10.5)
Chloride: 98 mEq/L (ref 96–112)
Creatinine, Ser: 0.57 mg/dL (ref 0.4–1.2)
GFR calc Af Amer: >60 mL/min (ref >60)
GFR calc non Af Amer: >60 mL/min (ref >60)
Glucose, Bld: 310 mg/dL — ABNORMAL HIGH (ref 70–99)
Potassium: 3.3 mEq/L — ABNORMAL LOW (ref 3.5–5.1)
Sodium: 130 mEq/L — ABNORMAL LOW (ref 135–145)
Total Bilirubin: 0.7 mg/dL (ref 0.3–1.2)
Total Protein: 7.1 g/dL (ref 6.0–8.3)

## 2010-10-06 LAB — CBC
HCT: 41.3 % (ref 36.0–46.0)
Hemoglobin: 13.8 g/dL (ref 12.0–15.0)
MCHC: 33.3 g/dL (ref 30.0–36.0)
MCV: 83.4 fL (ref 78.0–100.0)
Platelets: 163 10*3/uL (ref 150–400)
RBC: 4.95 MIL/uL (ref 3.87–5.11)
RDW: 13.1 % (ref 11.5–15.5)
WBC: 8.6 10*3/uL (ref 4.0–10.5)

## 2010-10-06 LAB — URINE MICROSCOPIC-ADD ON

## 2010-10-06 LAB — URINALYSIS, ROUTINE W REFLEX MICROSCOPIC
Bilirubin Urine: NEGATIVE
Glucose, UA: >1000 mg/dL — AB
Ketones, ur: NEGATIVE mg/dL
Nitrite: NEGATIVE
Protein, ur: 30 mg/dL — AB
Specific Gravity, Urine: 1.041 — ABNORMAL HIGH (ref 1.005–1.030)
Urobilinogen, UA: 0.2 mg/dL (ref 0.0–1.0)
pH: 5.5 (ref 5.0–8.0)

## 2010-10-06 LAB — LIPASE, BLOOD: Lipase: 18 U/L (ref 11–59)

## 2010-10-12 LAB — GLUCOSE, CAPILLARY
Glucose-Capillary: 196 mg/dL — ABNORMAL HIGH (ref 70–99)
Glucose-Capillary: 338 mg/dL — ABNORMAL HIGH (ref 70–99)
Glucose-Capillary: 351 mg/dL — ABNORMAL HIGH (ref 70–99)

## 2010-10-13 LAB — BASIC METABOLIC PANEL
BUN: 15 mg/dL (ref 6–23)
CO2: 28 mEq/L (ref 19–32)
Calcium: 8.7 mg/dL (ref 8.4–10.5)
Chloride: 105 mEq/L (ref 96–112)
Creatinine, Ser: 0.71 mg/dL (ref 0.4–1.2)
GFR calc Af Amer: >60 mL/min (ref >60)
GFR calc non Af Amer: >60 mL/min (ref >60)
Glucose, Bld: 237 mg/dL — ABNORMAL HIGH (ref 70–99)
Potassium: 3.7 mEq/L (ref 3.5–5.1)
Sodium: 140 mEq/L (ref 135–145)

## 2010-10-13 LAB — CARDIAC PANEL(CRET KIN+CKTOT+MB+TROPI)
CK, MB: 0.5 ng/mL (ref 0.3–4.0)
CK, MB: 0.5 ng/mL (ref 0.3–4.0)
Relative Index: INVALID (ref 0.0–2.5)
Relative Index: INVALID (ref 0.0–2.5)
Total CK: 28 U/L (ref 7–177)
Total CK: 33 U/L (ref 7–177)
Troponin I: 0.01 ng/mL (ref 0.00–0.06)
Troponin I: 0.03 ng/mL (ref 0.00–0.06)

## 2010-10-13 LAB — LIPID PANEL
Cholesterol: 242 mg/dL — ABNORMAL HIGH (ref 0–200)
HDL: 46 mg/dL (ref >39)
LDL Cholesterol: 175 mg/dL — ABNORMAL HIGH (ref 0–99)
Total CHOL/HDL Ratio: 5.3 RATIO
Triglycerides: 103 mg/dL (ref <150)
VLDL: 21 mg/dL (ref 0–40)

## 2010-10-13 LAB — POCT CARDIAC MARKERS
CKMB, poc: <1 ng/mL — ABNORMAL LOW (ref 1.0–8.0)
Myoglobin, poc: 41.3 ng/mL (ref 12–200)
Troponin i, poc: <0.05 ng/mL (ref 0.00–0.09)

## 2010-10-13 LAB — CBC
HCT: 41.9 % (ref 36.0–46.0)
HCT: 42.4 % (ref 36.0–46.0)
Hemoglobin: 13.8 g/dL (ref 12.0–15.0)
Hemoglobin: 14.2 g/dL (ref 12.0–15.0)
MCHC: 32.9 g/dL (ref 30.0–36.0)
MCHC: 33.5 g/dL (ref 30.0–36.0)
MCV: 82.1 fL (ref 78.0–100.0)
MCV: 83.5 fL (ref 78.0–100.0)
Platelets: 175 10*3/uL (ref 150–400)
Platelets: 179 10*3/uL (ref 150–400)
RBC: 5.02 MIL/uL (ref 3.87–5.11)
RBC: 5.17 MIL/uL — ABNORMAL HIGH (ref 3.87–5.11)
RDW: 13.3 % (ref 11.5–15.5)
RDW: 13.4 % (ref 11.5–15.5)
WBC: 6.3 10*3/uL (ref 4.0–10.5)
WBC: 7.1 10*3/uL (ref 4.0–10.5)

## 2010-10-13 LAB — RAPID URINE DRUG SCREEN, HOSP PERFORMED
Amphetamines: NOT DETECTED
Barbiturates: NOT DETECTED
Benzodiazepines: NOT DETECTED
Cocaine: NOT DETECTED
Opiates: NOT DETECTED
Tetrahydrocannabinol: NOT DETECTED

## 2010-10-13 LAB — TSH: TSH: 0.901 u[IU]/mL (ref 0.350–4.500)

## 2010-10-13 LAB — CK TOTAL AND CKMB (NOT AT ARMC)
CK, MB: 0.6 ng/mL (ref 0.3–4.0)
Relative Index: INVALID (ref 0.0–2.5)
Total CK: 32 U/L (ref 7–177)

## 2010-10-13 LAB — DIFFERENTIAL
Basophils Absolute: 0 10*3/uL (ref 0.0–0.1)
Basophils Relative: 1 % (ref 0–1)
Eosinophils Absolute: 0.1 10*3/uL (ref 0.0–0.7)
Eosinophils Relative: 1 % (ref 0–5)
Lymphocytes Relative: 33 % (ref 12–46)
Lymphs Abs: 2.1 10*3/uL (ref 0.7–4.0)
Monocytes Absolute: 0.3 10*3/uL (ref 0.1–1.0)
Monocytes Relative: 5 % (ref 3–12)
Neutro Abs: 3.8 10*3/uL (ref 1.7–7.7)
Neutrophils Relative %: 60 % (ref 43–77)

## 2010-10-13 LAB — POCT I-STAT, CHEM 8
BUN: 9 mg/dL (ref 6–23)
Calcium, Ion: 1.09 mmol/L — ABNORMAL LOW (ref 1.12–1.32)
Chloride: 101 mEq/L (ref 96–112)
Creatinine, Ser: 0.6 mg/dL (ref 0.4–1.2)
Glucose, Bld: 359 mg/dL — ABNORMAL HIGH (ref 70–99)
HCT: 46 % (ref 36.0–46.0)
Hemoglobin: 15.6 g/dL — ABNORMAL HIGH (ref 12.0–15.0)
Potassium: 4.2 mEq/L (ref 3.5–5.1)
Sodium: 134 mEq/L — ABNORMAL LOW (ref 135–145)
TCO2: 24 mmol/L (ref 0–100)

## 2010-10-13 LAB — GLUCOSE, CAPILLARY
Glucose-Capillary: 105 mg/dL — ABNORMAL HIGH (ref 70–99)
Glucose-Capillary: 149 mg/dL — ABNORMAL HIGH (ref 70–99)
Glucose-Capillary: 273 mg/dL — ABNORMAL HIGH (ref 70–99)
Glucose-Capillary: 321 mg/dL — ABNORMAL HIGH (ref 70–99)
Glucose-Capillary: 358 mg/dL — ABNORMAL HIGH (ref 70–99)

## 2010-10-13 LAB — HEMOGLOBIN A1C
Hgb A1c MFr Bld: 14.6 % — ABNORMAL HIGH (ref 4.6–6.1)
Mean Plasma Glucose: 372 mg/dL

## 2010-10-13 LAB — BRAIN NATRIURETIC PEPTIDE: Pro B Natriuretic peptide (BNP): <30 pg/mL (ref 0.0–100.0)

## 2010-10-13 LAB — TROPONIN I: Troponin I: 0.03 ng/mL (ref 0.00–0.06)

## 2010-10-13 LAB — D-DIMER, QUANTITATIVE: D-Dimer, Quant: <0.22 ug/mL-FEU (ref 0.00–0.48)

## 2010-10-14 LAB — DIFFERENTIAL
Basophils Absolute: 0 10*3/uL (ref 0.0–0.1)
Basophils Relative: 1 % (ref 0–1)
Eosinophils Absolute: 0.2 10*3/uL (ref 0.0–0.7)
Eosinophils Relative: 2 % (ref 0–5)
Lymphocytes Relative: 35 % (ref 12–46)
Lymphs Abs: 2.5 10*3/uL (ref 0.7–4.0)
Monocytes Absolute: 0.5 10*3/uL (ref 0.1–1.0)
Monocytes Relative: 7 % (ref 3–12)
Neutro Abs: 3.8 10*3/uL (ref 1.7–7.7)
Neutrophils Relative %: 55 % (ref 43–77)

## 2010-10-14 LAB — URINALYSIS, ROUTINE W REFLEX MICROSCOPIC
Bilirubin Urine: NEGATIVE
Glucose, UA: >1000 mg/dL — AB
Ketones, ur: NEGATIVE mg/dL
Nitrite: NEGATIVE
Protein, ur: NEGATIVE mg/dL
Specific Gravity, Urine: 1.041 — ABNORMAL HIGH (ref 1.005–1.030)
Urobilinogen, UA: 0.2 mg/dL (ref 0.0–1.0)
pH: 5.5 (ref 5.0–8.0)

## 2010-10-14 LAB — URINE MICROSCOPIC-ADD ON

## 2010-10-14 LAB — CBC
HCT: 42 % (ref 36.0–46.0)
Hemoglobin: 14 g/dL (ref 12.0–15.0)
MCHC: 33.3 g/dL (ref 30.0–36.0)
MCV: 83.8 fL (ref 78.0–100.0)
Platelets: 175 10*3/uL (ref 150–400)
RBC: 5.01 MIL/uL (ref 3.87–5.11)
RDW: 13.2 % (ref 11.5–15.5)
WBC: 6.9 10*3/uL (ref 4.0–10.5)

## 2010-10-14 LAB — BASIC METABOLIC PANEL
BUN: 7 mg/dL (ref 6–23)
CO2: 25 mEq/L (ref 19–32)
Calcium: 8.8 mg/dL (ref 8.4–10.5)
Chloride: 104 mEq/L (ref 96–112)
Creatinine, Ser: 0.53 mg/dL (ref 0.4–1.2)
GFR calc Af Amer: >60 mL/min (ref >60)
GFR calc non Af Amer: >60 mL/min (ref >60)
Glucose, Bld: 247 mg/dL — ABNORMAL HIGH (ref 70–99)
Potassium: 3.8 mEq/L (ref 3.5–5.1)
Sodium: 136 mEq/L (ref 135–145)

## 2010-10-14 LAB — GLUCOSE, CAPILLARY: Glucose-Capillary: 260 mg/dL — ABNORMAL HIGH (ref 70–99)

## 2010-10-14 LAB — POCT PREGNANCY, URINE: Preg Test, Ur: NEGATIVE

## 2010-10-14 LAB — URINE CULTURE: Colony Count: 50000

## 2010-11-17 NOTE — Op Note (Signed)
NAME:  [name] [name], [name] [name]                   ACCOUNT NO.:  [id number]   MEDICAL RECORD NO.:  [id number]          PATIENT TYPE:  OIB   LOCATION:  1506                         FACILITY:  [facility]   PHYSICIAN:  Kathrin Penner, M.D.   DATE OF BIRTH:  1966/01/02   DATE OF PROCEDURE:  07/31/2007  DATE OF DISCHARGE:                               OPERATIVE REPORT   PREOPERATIVE DIAGNOSIS:  Biliary dyskinesia.   POSTOPERATIVE DIAGNOSIS:  Biliary dyskinesia.   PROCEDURE:  Laparoscopic cholecystectomy, intraoperative cholangiogram.   SURGEON:  Kathrin Penner, M.D.   ASSISTANT:  Orson Ape. Rise Patience, M.D.   ANESTHESIA:  General.   SPECIMENS TO LAB:  Gallbladder.   ESTIMATED BLOOD LOSS:  Minimal.   COMPLICATIONS:  None.   The patient returned to PACU in excellent condition.  The patient is a  45 year old diabetic female with recurrent epigastric and right upper  quadrant pain and negative ultrasound; however, the patient does have a  HIDA scan with biliary dysfunction which shows a biliary dyskinesia with  biliary gallbladder ejection fraction of 19%.  The patient comes to the  operating room now after risks and potential benefits of surgery been  discussed.  All questions answered, consent obtained.   PROCEDURE:  With the patient positioned supinely following induction of  satisfactory general anesthesia the abdomen is prepped and draped to be  included in a sterile operative field followed by identification of the  patient as [name] [name] and the procedure to be done as laparoscopic  cholecystectomy with intraoperative cholangiogram was carried out.  Open  laparoscopy is created at the umbilicus with insertion of a Hassan  cannula and insufflation of peritoneal cavity to 14 mmHg pressure using  carbon dioxide.  Visual exploration of the abdomen carried out with 0  degrees scope.  The gallbladder was noted be very large and boggy.  Liver edges were sharp, liver surfaces smooth.  None of the small  or  large intestine visualized appeared to be abnormal.  There were multiple  adhesions within the pelvis.  Pelvic organs were not visualized because  of these adhesions.   Under direct vision epigastric and lateral ports were placed.  The  gallbladder is grasped, retracted cephalad and dissection was carried  down the region of the ampulla of the gallbladder, isolation of the  cystic artery and cystic duct.  Cystic artery doubly clipped and  transected, the cystic duct was clipped proximally and opened.  The  cystic duct cholangiogram was then carried out by passing a Cook  catheter into the abdomen into the cystic duct through which one half-  strength Renografin was injected under fluoroscopic control.  The  resulting cholangiogram showed normal caliber ducts, prompt flow of  contrast into the duodenum, normal tapering of distal common bile duct  and normal flow into the right and left hepatic radicals.  The cystic  duct catheter was removed and the cystic duct is triply clipped and then  transected.  The gallbladder was then dissected free from the liver bed  using electrocautery and maintaining hemostasis throughout the course of  dissection  with electrocautery and with clips as appropriate.  At the  end of dissection the gallbladder is removed and placed in an Endopouch.  Additional bleeding points were treated electrocautery within the liver  bed.  The gallbladder retrieved through the umbilical port without  difficulty.  Sponge, instrument and sharp counts were verified.  The  right upper quadrant thoroughly irrigated and aspirated.  The  pneumoperitoneum was then allowed to deflate and the wounds closed in  layers as follows.  Umbilical wound in two layers with 0-0 Vicryl and 4-  0 Monocryl, epigastric and lateral flank wounds closed with 4-0  Monocryl.  All wounds reinforced with Steri-Strips.  Sterile dressings  applied.  The anesthetic reversed and the patient removed from  the  operating room to the recovery room in stable condition, tolerated the  procedure well.      Kathrin Penner, M.D.  Electronically Signed     PB/MEDQ  D:  07/31/2007  T:  07/31/2007  Job:  [id number]

## 2010-11-17 NOTE — Consult Note (Signed)
NAME:  [name] [name], [name] [name]                   ACCOUNT NO.:  [id number]   MEDICAL RECORD NO.:  [id number]          PATIENT TYPE:  INP   LOCATION:  5506                         FACILITY:  [facility]   PHYSICIAN:  John C. Amedeo Plenty, M.D.    DATE OF BIRTH:  Apr 25, 1966   DATE OF CONSULTATION:  DATE OF DISCHARGE:                                 CONSULTATION   This is a very anuric see a lithium GI consultation for Dr. Teena Irani  on the patient least to stay for Apr 10, 1966 we are asked to see  [name] [name] today by the medical teaching service, in consultation for  right upper quadrant pain, nausea and vomiting.   HISTORY OF PRESENT ILLNESS:  This is a 45 year old female who has type 1  diabetes, who has had right upper quadrant pain and radiates to her  back, for the last 3 weeks.  Her pain has waxed and waned.  She also had  nausea and vomiting that worsens after eating.  She says she will vomits  green acidic liquid.  The patient tells me that she is chronically  constipated and may have a bowel movement twice a week.  She does not  complain of any recent fever, cold symptoms, previous history of current  symptoms, weight loss or heartburn.  She is on only insulin at home.   PAST MEDICAL HISTORY:  Significant for type 1 diabetes, as well as a  hysterectomy.   ALLERGIES:  SHE HAS NO KNOWN DRUG ALLERGIES.   FAMILY HISTORY:  Her family history is significant for a sister with  gallbladder disease.   SOCIAL HISTORY:  No drugs, no tobacco, no alcohol.  She has three almost  grown children.   PHYSICAL EXAM:  GENERAL:  She is alert and oriented in no apparent  distress.  She looks well.  VITAL SIGNS:  Her temperature is 98.5, pulse 103, respirations are 20.  Blood pressure is 106/75.  HEART:  Her heart has a regular rate and rhythm with no murmurs, rubs or  gallops appreciated.  LUNGS:  Her lungs demonstrate no wheezes or crackles.  ABDOMEN:  Soft, nondistended, with some tenderness in the right  upper  quadrant.  She has good bowel sounds.   LABORATORY:  Her labs today show a hemoglobin of 11.3, white count 5.4,  hematocrit 34.2, platelets 165,000.  Her BMET is within normal limits,  other than a glucose of 177.  Ultrasound of her abdomen done on November  20 was normal.  CT of her abdomen and pelvis done on November 20 was  also normal, with no biliary dilatation are cholelithiasis or  cholecystitis seen.   ASSESSMENT:  Dr. Teena Irani has seen and examined the patient, collected  a history and reviewed her chart.   IMPRESSION:  His impression is that this is a 45 year old female with  right upper quadrant pain, nausea and vomiting intermittently for the  last 3 weeks.  With a normal CT and ultrasound of her abdomen, will  evaluate first for possible gastroparesis versus biliary dyskinesia.  Will start evaluation  with a gastric emptying scan.  Will consider upper  endoscopy, if the emptying scan is negative.      Melton Alar, PA    ______________________________  Elyse Jarvis Amedeo Plenty, M.D.    MLY/MEDQ  D:  05/30/2007  T:  05/30/2007  Job:  [id number]   cc:   Elyse Jarvis. Amedeo Plenty, M.D.  Jay Schlichter, M.D.

## 2010-11-17 NOTE — Discharge Summary (Signed)
NAME:  [name], [name]                   ACCOUNT NO.:  [id number]   MEDICAL RECORD NO.:  [id number]          PATIENT TYPE:  INP   LOCATION:  3707                         FACILITY:  [facility]   PHYSICIAN:  Jamal Collin. Hensel, M.D.DATE OF BIRTH:  1966-04-18   DATE OF ADMISSION:  12/01/2008  DATE OF DISCHARGE:  12/03/2008                               DISCHARGE SUMMARY   PRIMARY CARE PHYSICIAN:  [facility].   DISCHARGE DIAGNOSES:  1. Atypical chest pain.  2. Diabetes mellitus, insulin dependent.  3. Hyperlipidemia.   DISCHARGE MEDICATIONS:  1. Zocor 20 mg by mouth daily.  2. NovoLog 20 units t.i.d. with meal.  3. NovoLog sliding scale insulin per home regimen.  4. Lisinopril 5 mg by mouth daily.  5. Aspirin 325 mg by mouth daily for 1 month and 81 mg by mouth daily      thereafter.  6. Metoprolol 2.5 mg by mouth twice daily.   LABORATORY DATA:  Hemoglobin A1c 14.6.  TSH 0.901.  Cardiac enzymes  negative x3.  Basic metabolic panel within normal limits with the  exception of elevated glucose.  Cholesterol 242, triglyceride 103, HDL  46, LDL 175.  Urine drug screen negative.  BNP less than 30.  D-dimer  less than 0.22.   IMAGES:  Chest x-ray on Dec 01, 2008.  Impression:  No active cardiopulmonary disease.   Stress Myoview on December 03, 2008.  Impression:  1. No evidence of exercise-induced myocardial ischemia.  2. Left ventricular ejection fraction equals 72%.  3. Normal left ventricular systolic function.   BRIEF HOSPITAL COURSE:  [name] [name] is a pleasant 45 year old African  American female with a family history of early MI, diabetes mellitus  that is insulin dependent, and hyperlipidemia, that presented with  atypical chest pain and was admitted for rule out MI.  1. Chest pain.  We appreciate cardiology consulting during this      admission.  The patient had a negative cardiac workup with negative      cardiac enzymes x3, negative chest x-ray, and 2 EKGs that showed      normal sinus  rhythm with no ST changes as well as no events on      telemetry.  Because of her high risk with diabetes and family      history of early MI, the patient underwent a Cardiolite that showed      no evidence of exercise-induced myocardial ischemia.  The patient      was started on low-dose beta-blocker as well as low-dose ACE      inhibitor prior to discharge.  She tolerated these medications      well.  The patient was also discharged on a full dose of aspirin x1      month with instructions to decrease it to a baby aspirin      afterwards.  2. Diabetes mellitus that is insulin dependent.  The patient has a      home regimen of NovoLog 20 units t.i.d. a.c. with sliding scale.      This regimen was found to be inadequate.  Her A1c equals 14.6.  We      recommend that she and her PCP adjust this regimen to maintain      adequate blood sugars.  The patient was also started on an ACE      while in the hospital and discharged on aspirin.  3. Hyperlipidemia.  Please see cholesterol and triglyceride results      above.  The patient was started on Zocor with an LDL goal of less      than 100.   INSTRUCTIONS:  The patient was instructed to follow up at [facility] in  1-2 weeks.  She was also advised to follow a diabetic diet and educated  regarding her new medications.   FOLLOWUP ISSUES:  1. Insulin regimen adjustment.  2. Follow up fasting lipid panel in 3 months.      Briscoe Deutscher, MD  Electronically Signed      Jamal Collin. Andria Frames, M.D.  Electronically Signed    EW/MEDQ  D:  12/03/2008  T:  12/04/2008  Job:  [id number]

## 2010-11-17 NOTE — Discharge Summary (Signed)
NAME:  [name] [name], [name] [name]                   ACCOUNT NO.:  [id number]   MEDICAL RECORD NO.:  [id number]          PATIENT TYPE:  INP   LOCATION:  5506                         FACILITY:  [facility]   PHYSICIAN:  Junius Finner, MD     DATE OF BIRTH:  [birth year]   DATE OF ADMISSION:  05/27/2007  DATE OF DISCHARGE:  05/31/2007                               DISCHARGE SUMMARY   DISCHARGE DIAGNOSES:  1. Right upper quadrant pain.  2. Noncardiac chest pain.  3. Back pain.  4. Insulin-dependent diabetes mellitus.  5. Intermittent bradycardia.   DISCHARGE MEDICATIONS:  1. Insulin 70/30, 15 units in the morning and 15 units at night.  2. Protonix 40 mg daily.  3. Phenergan 25 mg q.4h. p.r.n.  4. Vicodin 5-500 mg 1 tablet q.6h. p.r.n. with 28 tablets given.   DISPOSITION AND FOLLOW UP:  The patient is to follow up in the [facility] with Dr. Jerilee Hoh on June 07, 2007 at  1:30 p.m.  At that time, a CBG needs to be checked and the patient needs  to be set up for a diabetes educator appointment.  The patient is also  to followup with Dr. Amedeo Plenty in gastroenterology on June 13, 2007 at  3:00 p.m.   PROCEDURES PERFORMED:  A Myoview was performed during this  hospitalization and was within normal limits with an ejection fraction  at 67% and no ventricular wall motion abnormalities and no evidence of  ischemia or infarction.  A gastric emptying study was also done during  this hospitalization.  This showed prompt gastric emptying within normal  limits with only 1% of gastric activity remaining at 90 minutes.   CONSULTATIONS:  Cardiology was consulted secondary to chest pain, nausea  and vomiting.  A Myoview was done that was negative for cardiac  etiology.  Gastroenterology was consulted secondary to this right upper  quadrant pain, nausea, and vomiting and recommended a gastric emptying  study, given the patient's history of diabetes mellitus that is poorly  controlled and her  gastric emptying study was within normal limits.  The  patient had followup appointments scheduled with the gastroenterologist  with 2 weeks of discharge and it was felt like she might benefit from a  HIDA scan.   ADMITTING HISTORY AND PHYSICAL:  Patient is a 45 year old female with  past medical history of diabetes mellitus, insulin-dependent, presenting  with a 3-week history of epigastric and right upper quadrant pain that  radiates to her lower back.  The pain is an 8-9/10 in intensity and  associated with nausea and vomiting.  She denies any diarrhea,  hematochezia, and hematemesis.  She has vomited 3-4 times in the last  couple of days and has been to the emergency room twice over the last  week.  She denies any alcohol ingestion or illegal drugs.  She denies  any dysuria or increased urinary frequency.   PHYSICAL EXAMINATION:  VITAL SIGNS:  Temperature 98.2, blood pressure  163/107, pulse 88, respiratory rate 18, O2 saturation 98% on room air.  GENERAL:  The patient  is overweight and vomiting throughout the  interview.  EYES:  Pupils equally round and reactive to light, remain nonicteric.  RESPIRATORY:  Clear to auscultation bilaterally.  CARDIOVASCULAR:  Regular rate and rhythm.  ABDOMEN:  Good bowel sounds.  Mild epigastric and right flank tenderness  without rebound or guarding.  Nondistended.  EXTREMITIES:  No edema.   ADMISSION LABS:  BMET - sodium 132, potassium 3.8, chloride 101, bicarb  26, BUN 9, creatinine 0.62, glucose 319.  Liver enzymes - bilirubin 0.7,  alk-phos 84, AST 15, ALT 13, protein 6.5, albumin 3.3, calcium 8.5.  CBC  - white blood cell count 5.4, hemoglobin 13.0, with a MCV of 82.9,  platelets 211, lipase 23.  Urinalysis positive for glucose greater than  1000, negative for blood, nitrites and leukocytes.   DIAGNOSTIC IMAGING:  1. A CT scan of the abdomen and pelvis done 2 days prior to admission      for similar complaints, showed no acute findings,  status post      hysterectomy.  Although the appendix is not visualized, there is no      evidence of appendicitis or pericecal inflammation.  2. Ultrasound of the abdomen done 2 days prior to admission was      completely within normal limits.  An acute abdominal series done      the day of admission showed no acute abnormality with mildly      prominent stool.  3. 2D echocardiogram was within normal limits with an ejection      fraction between 55-60% and no left ventricular regional wall      motion abnormalities.   HOSPITAL COURSE:  1. Epigastric and right upper quadrant pain associated with nausea and      vomiting.  Patient was put n.p.o. and given IV fluid hydration.      She then noted chest pain and a cardiac workup was done secondary      to her diabetes and wanting to rule out cardiac etiology, because      of potential for an atypical presentation.  Her cardiac workup was      negative.  Gastroenterology was consulted and recommended a gastric      emptying study, which was within normal limits.  The patient      tolerated a regular diet and did not have any recurrent episodes of      vomiting during this hospitalization.  It was felt that the      patient's pain might be biliary in etiology and could benefit from      an outpatient HIDA scan.  The patient's pain was well controlled      with IV morphine at 2 mg initially and she was switched to p.o.      pain medicines and tolerated those well at the time of discharge.      The patient was hemoccult-negative.  2. Noncardiac chest pain.  The patient reported chest pain the day of      admission.  A cardiac workup was done and was within normal limits.      The patient had a 2D echocardiogram that was done during this      hospitalization and was within normal limits.  3. Back pain.  The patient reported right-sided lower back pain and      physical exam was very consistent with sacroiliitis.  However, the      patient  notes that the back pain is associated with epigastric  pain, which clinically did not make a whole lot of sense for      sacroiliitis.  The patient was extremely tender to palpation over      the sacroiliac joint and Vicodin was given with good pain control.      Her CT of the abdomen/pelvis was reviewed with radiology and it was      felt like the patient could have a bulging disk in that area and      recommended a followup MRI if the patient had sciatic symptoms.      The patient had no sciatic symptoms and her physical exam was      negative for any sciatic symptoms, so a MRI was not done.  An ANA,      sed rate and rheumatoid factor were all done and were within normal      limits.  4. Insulin-dependent diabetes mellitus.  The patient reports a history      of diabetes that is poorly controlled, that was diagnosed      approximately 10 years ago.  She reports that her home regimen is      15 units of 70/30 in the morning and 15 units of 70/30 at night.  A      hemoglobin A1c was checked during this hospitalization and was      13.9.  A diabetes education consult was obtained and the patient      would benefit from outpatient diabetes education referral.  The      patient's CBGs ranged from the 100-200 range during this      hospitalization on Lantus q.h.s., along with sliding scale insulin.      The patient may need an ACE inhibitor added to her regimen as an      outpatient.  A urine micro albumin was performed and was 0.90,      within normal limits.  5. Intermittent bradycardia.  The patient was noted to have      intermittent bradycardia with heart rates dipping into the mid 40s      during this hospitalization.  The patient was asymptomatic at this      time and it was not felt to be a problem.   DISCHARGE LABS:  ANA negative, sed rate 8, rheumatoid factor less than  20.  BMET - sodium 139, potassium 3.8, chloride 107, bicarb 25, BUN 10,  creatinine 0.67, glucose 177,  calcium 8.2.  CBC - white blood cell count  5.4, hemoglobin 11.3, MCV 82.2, platelet count 165.  Hemoccult negative.  Urine micro albumin 0.90.      Junius Finner, MD  Electronically Signed     VW/MEDQ  D:  05/31/2007  T:  06/01/2007  Job:  [id number]   cc:   Elyse Jarvis. Amedeo Plenty, M.D.

## 2010-11-17 NOTE — H&P (Signed)
NAME:  [name], [name]                   ACCOUNT NO.:  [id number]   MEDICAL RECORD NO.:  [id number]          PATIENT TYPE:  INP   LOCATION:  3707                         FACILITY:  [facility]   PHYSICIAN:  Jamal Collin. Hensel, M.D.DATE OF BIRTH:  05-29-1966   DATE OF ADMISSION:  12/01/2008  DATE OF DISCHARGE:                              HISTORY & PHYSICAL   PRIMARY CARE [name]:  [facility].   CHIEF COMPLAINT:  Chest pain and shortness of breath.   HISTORY OF PRESENT ILLNESS:  A 45 year old female with history of type 2  diabetes mellitus who presents with substernal chest pain, which started  while getting dressed for church.  Chest pain began as heaviness located  in the middle of the chest nonradiating, relieved by nothing, aggravated  by nothing.  Chest pressure associated with weakness, shortness of  breath, and clammy feeling.  Denies nausea or diaphoresis.  The patient  drove to church with continued chest pain; however, then it became  morbid pinching feeling, and the patient became even more weak and  therefore came to hospital.  In the emergency department, chest  heaviness was partially relieved by Nitropaste, unclear if GI cocktail  helped after 3 hours of pain.  During evaluation by admitting physician,  the patient was chest pain free; however, mentioned that she had an  episode 20 minutes prior of the similar pinching feeling in the mid  chest.   REVIEW OF SYSTEMS:  Denied any sick contacts or fever.  Admitted to  nausea which started in ED.  Denied diarrhea, body aches, and admitted  to headache which started in the ED.   PAST MEDICAL HISTORY:  1. Type 2 diabetes mellitus, diagnosed 13 years ago.  2. Ovarian cyst.   PAST SURGICAL HISTORY:  Hysterectomy and cholecystectomy.   ALLERGIES:  No known drug allergies.   MEDICATIONS:  NovoLog subcu 20 units q.a.c. plus sliding scale insulin.   FAMILY HISTORY:  Father with MI, age 48, hypertension.   SOCIAL HISTORY:  She is  divorced.  Denies tobacco, illicit, or alcohol.   CARDIAC WORKUP:  The patient had had previous cardiac workup in 2008.  She states she had a negative stress Myoview.  Echocardiogram showed 55-  60% ejection fraction with no wall motion abnormalities.   PHYSICAL EXAMINATION:  VITAL SIGNS:  Temperature 98.6, heart rate 86,  blood pressure 114 to 138 over 81 to 93, respiratory rate 20 to 24, and  O2 sat 100% on room air.  GENERAL:  In no acute distress, alert and oriented x3, pleasant.  HEENT:  PERRLA.  Nonicteric.  EOMI.  Oropharynx clear.  No bruit.  No  lymphadenopathy.  Most mucous membranes.  CVS:  Regular rate and rhythm.  No murmur.  RESPIRATORY:  CTAB.  ABDOMEN:  Positive bowel sounds, soft, nontender, nondistended.  EXTREMITIES:  No edema.  Pulses 2+.  NEUROLOGIC:  No focal deficits.   EKG normal sinus rhythm, heart rate 71.  BNP less than 30.  Point-of-  care negative x1.  Chest x-ray, no acute cardiopulmonary disease.  I-  STAT sodium 134,  potassium 4.2, BUN 9, creatinine 0.6, glucose 359.  Hemoglobin 14.2, hematocrit 42.4, white cell count of 6.3, platelets  179.  Differential within normal limits.   ASSESSMENT AND PLAN:  A 45 year old female with atypical chest pain  admitted for rule out myocardial ischemia.  1. Chest pain.  The patient's chest pain atypical, lasted greater than      3 hours with minimal relief with nitroglycerin.  Note, it is      substernal, did occur with exertion as well.  We will place on      telemetry, cycle cardiac enzymes.  Repeat EKG.  We will consult      Cardiology secondary to continued chest pain in a diabetic patient      and family history of early myocardial infarction.  Other      differentials for chest pain include thyroid dysfunction.  We will      check TSH, gastrointestinal disease, although no change with GI      cocktail.  We will start aspirin and beta blocker after UDS is      negative.  We will start heparin t.i.d., morphine,  Zofran as needed      and continue Nitropaste.  We will check fasting lipid panel.  We      will also start proton pump inhibitor daily.  2. Diabetes mellitus.  The patient with interesting regimen for type 2      diabetes, unclear of the patient's specific sliding scale.  Give 20      units q.a.c. and monitor CBGs and add sliding scale after primary      team is able to trend patient's blood sugars in the hospital      setting.  We will check hemoglobin A1c.  3. Headache likely related to Nitropaste.  We will give morphine and      Tylenol as needed.  4. Fluids, electrolytes, nutrition/gastrointestinal, p.o. ad lib      diabetic diet.  5. Prophylaxis.  Heparin t.i.d. PPI.   DISPOSITION:  Pending cardiac workup.      Vic Blackbird, MD  Electronically Signed      Jamal Collin. Andria Frames, M.D.  Electronically Signed    KD/MEDQ  D:  12/01/2008  T:  12/02/2008  Job:  [id number]

## 2010-11-17 NOTE — Group Therapy Note (Signed)
NAME:  [name] [name], [name] [name]                   ACCOUNT NO.:  [id number]   MEDICAL RECORD NO.:  [id number]          PATIENT TYPE:  WOC   LOCATION:  [facility]                   FACILITY:  [facility]   PHYSICIAN:  Emily Filbert, MD        DATE OF BIRTH:  08-26-1965   DATE OF SERVICE:                                  CLINIC NOTE   HISTORY OF PRESENT ILLNESS:  [name] [name] is a 45 year old divorced female  with adult onset, insulin-dependent, poorly-controlled diabetes.  She  was seen in the emergency room twice in the last month for right lower  quadrant pain.  A CT scan showed some question of an ovarian cyst and  ultrasound showed a 2 cm ovarian cyst, no free fluid.  She had a  hysterectomy and removal of her left ovary in [place] in the year  2000 for similar pain.  The pain she currently describes as very severe.  It started about 2 months ago with a gradual onset to crescendo  recently.  She is taking oxycodone and ibuprofen with minimal relief.   PHYSICAL EXAM:  Well-nourished, well-hydrated diabetic.  The patient  states her sugars run in the 400s.  Her bimanual exam reveals no cuff tenderness.  There is slight  tenderness in her right lower quadrant.   ASSESSMENT/PLAN:  Right lower quadrant pain, a 2 cm functional ovarian  cyst on ultrasound.  I have offered her birth control pills for  suppression of ovulation versus Depo-Provera.  She chooses Depo-Provera,  which will be given.  With regard to her vaginitis, she prefers a cream  over a pill.  So I have given her prescription for generic Terazol 7.      Emily Filbert, MD     MCD/MEDQ  D:  02/22/2008  T:  02/22/2008  Job:  [id number]

## 2010-11-17 NOTE — Op Note (Signed)
NAME:  [name] [name], [name] [name]                   ACCOUNT NO.:  [id number]   MEDICAL RECORD NO.:  [id number]          PATIENT TYPE:  AMB   LOCATION:  ENDO                         FACILITY:  [facility]   PHYSICIAN:  John C. Amedeo Plenty, M.D.    DATE OF BIRTH:  03-27-1966   DATE OF PROCEDURE:  06/20/2007  DATE OF DISCHARGE:                               OPERATIVE REPORT   PROCEDURE:  Esophagogastroduodenoscopy.   INDICATIONS FOR PROCEDURE:  Continued postprandial abdominal pain with  failure to respond to proton pump inhibitor, negative abdominal  ultrasound and negative abdominal CT scan, negative nuclear medicine  gastric emptying study but with abnormal recent CCK stimulated PEPIDA  scan.  Procedure is to assess for any acid peptic disease or other  gastroduodenal causes of her pain before consideration of referral for  cholecystectomy.   PROCEDURE:  The patient was placed in the left lateral decubitus  position and placed on the pulse monitor with continuous low-flow oxygen  delivered by nasal cannula.  She was sedated with 75 mcg IV fentanyl and  5 mg IV Versed.  The Olympus video endoscope was advanced under direct  vision into the oropharynx and esophagus.  The esophagus was straight  and of normal caliber with the squamocolumnar line at 38 cm.  There was  no visible hiatal hernia, ring, stricture or other abnormality of the GE  junction.  The stomach was entered and a small amount of liquid  secretions were suctioned from the fundus.  Retroflexed view of the  cardia was unremarkable.  The fundus appeared normal.  The body and  antrum showed some erythema and granularity and two or three small  erosions, one with exudate, but no depth, probably better characterized  as a shallow erosions rather than ulcer.  A CLO-test was obtained.  The  pylorus was nondeformed and easily allowed passage of the endoscope tip  into the duodenum.  Both bulb and second portion were well inspected and  appeared be within  normal limits.  Scope was withdrawn back into the  stomach and CLO-test obtained.  Scope was then withdrawn and the patient  returned to the recovery room in stable condition.  She tolerated the  procedure well and there were no immediate complications.   IMPRESSION:  Moderate gastritis with two or three focal erosions, one  with some central exudate, otherwise normal study.   PLAN:  Await CLO-test and treat for eradication of Helicobacter if  positive.  If she does not respond to this and has had an adequate dose  of proton pump inhibitor, will probably refer her for consideration of  cholecystectomy based on her abnormal ejection fraction of 19% on PEPIDA  scan.           ______________________________  Elyse Jarvis. Amedeo Plenty, M.D.     JCH/MEDQ  D:  06/20/2007  T:  06/20/2007  Job:  [id number]   cc:   Donzetta Matters, MD  Fax: [phone]

## 2010-11-17 NOTE — Consult Note (Signed)
NAME:  YANG, [name] [name]                   ACCOUNT NO.:  [id number]   MEDICAL RECORD NO.:  [id number]          PATIENT TYPE:  INP   LOCATION:  3707                         FACILITY:  [facility]   PHYSICIAN:  Nigel Bridgeman, MD     DATE OF BIRTH:  December 06, 1965   DATE OF CONSULTATION:  12/01/2008  DATE OF DISCHARGE:                                 CONSULTATION   CHIEF COMPLAINT:  Chest pain.   HISTORY OF PRESENT ILLNESS:  This is a 45 year old, African American  female with a history of diabetes mellitus who had onset of weakness  while changing her clothes to go to church today.  She was very weak  very suddenly and had some mild shortness of breath and diaphoresis.  She started having substernal pinching which progressed to pressure  without radiation during church.  Total time of her symptoms was around  3 hours in duration.  In the emergency department, she was given aspirin  and nitroglycerin paste with relief of symptoms.  Her sugars have been  ranging in the 300-400s over the last few days.  No prior symptoms like  this in the past.  No edema, orthopnea or PND.   PAST MEDICAL HISTORY:  1. Insulin-dependent diabetes mellitus type 2.  2. Status post cholecystectomy.  3. Ovarian cyst.  4. Status post total abdominal hysterectomy.   ALLERGIES:  No known drug allergies.   MEDICATIONS:  At home, she is on NovoLog 20 units t.i.d.   SOCIAL HISTORY:  She lives in [place] with her daughters and her son.  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  No tobacco, alcohol or drugs.   FAMILY HISTORY:  Her father died of a heart attack at the age of 48.  He  was also a diabetic.   REVIEW OF SYSTEMS:  A complete review of systems done, found to be  otherwise negative except as stated in the HPI.   PHYSICAL EXAM:  Temperature is 98.4 with a pulse of 76, respiratory rate  18, blood pressure 138/93, O2 saturation 100% on room air.  GENERAL:  She is obese in no acute distress.  HEENT:  Shows NCAT, PERRLA, EOMI,  MMM, oropharynx without erythema or  exudates.  NECK:  Supple without lymphadenopathy, thyromegaly, bruits or jugular  venous distention.  HEART:  Has a regular rate and rhythm with a normal S1 and S2.  No  murmurs, gallops or rubs.  Normal PMI.  Pulses 2+ and equal bilaterally  without bruits.  LUNGS:  Clear to auscultation bilaterally without wheezes, rhonchi or  rales.  SKIN:  No rashes or lesions.  ABDOMEN:  Soft and nontender with normal bowel sounds.  No rebound or  guarding.  EXTREMITIES:  Show no cyanosis, clubbing or edema.  MUSCULOSKELETAL:  Shows no joint deformity or effusions.  No spine or  CVA tenderness.  NEUROLOGIC:  She is alert and oriented x3 with cranial  nerves II-XII grossly intact.  Strength 5/5 all extremities and axial  groups with normal sensation throughout.   RADIOLOGY:  Chest x-ray with no acute cardiopulmonary disease.  EKG  shows a rate of 71 and normal sinus rhythm without ST or T-wave changes.   LABS:  Normal white count with a hemoglobin of 15.6.  Creatinine is 0.6  with a glucose of 359.  Her CK-MB and troponin are negative.   ASSESSMENT/PLAN:  This is a 45 year old, African American female with a  history of insulin-dependent diabetes mellitus who presents with  atypical chest pain.  With her diabetes mellitus history, she is at high  risk for a coronary artery disease.  Will need a rule-out overnight with  serial cardiac enzymes and EKGs.  No anticoagulation is required at this  time, but she should be heparinized if her troponin becomes positive.  If she has been completely ruled out in the morning, then she will need  a stress test prior to discharge.  Her lipid status is unknown and her  LFTs should be checked.  Hemoglobin A1c is also unknown, although I  suspect it is high and this should be checked.  She should remain on  telemetry overnight.  I agree with the use of a beta blocker, keeping  her heart rate above 55 and her systolic blood  pressure above 100.  As  she is suspected of having a possible acute coronary syndrome, full dose  aspirin daily is recommended until she is out of the acute coronary  syndrome window, which is approximately 1 month, and then it can be  downplayed to 81 mg daily thereafter for the remainder of her life.  We  will follow up in the morning concerning her troponins and arrangement  of a stress test or a catheterization depending on what these reveal.      Nigel Bridgeman, MD  Electronically Signed     ACJ/MEDQ  D:  12/01/2008  T:  12/02/2008  Job:  [id number]

## 2011-01-28 ENCOUNTER — Emergency Department (HOSPITAL_COMMUNITY)
Admission: EM | Admit: 2011-01-28 | Discharge: 2011-01-28 | Disposition: A | Discharge status: Home / Self Care | Payer: BC Managed Care – PPO | Attending: Emergency Medicine | Admitting: Emergency Medicine

## 2011-01-28 ENCOUNTER — Emergency Department (HOSPITAL_COMMUNITY): Payer: BC Managed Care – PPO

## 2011-01-28 DIAGNOSIS — E785 Hyperlipidemia, unspecified: Secondary | ICD-10-CM | POA: Insufficient documentation

## 2011-01-28 DIAGNOSIS — Z794 Long term (current) use of insulin: Secondary | ICD-10-CM | POA: Insufficient documentation

## 2011-01-28 DIAGNOSIS — E119 Type 2 diabetes mellitus without complications: Secondary | ICD-10-CM | POA: Insufficient documentation

## 2011-01-28 DIAGNOSIS — R0789 Other chest pain: Secondary | ICD-10-CM | POA: Insufficient documentation

## 2011-01-28 DIAGNOSIS — R0602 Shortness of breath: Secondary | ICD-10-CM | POA: Insufficient documentation

## 2011-01-28 DIAGNOSIS — M25519 Pain in unspecified shoulder: Secondary | ICD-10-CM | POA: Insufficient documentation

## 2011-01-28 DIAGNOSIS — R110 Nausea: Secondary | ICD-10-CM | POA: Insufficient documentation

## 2011-01-28 LAB — DIFFERENTIAL
Basophils Absolute: 0 10*3/uL (ref 0.0–0.1)
Basophils Relative: 1 % (ref 0–1)
Eosinophils Absolute: 0.1 10*3/uL (ref 0.0–0.7)
Eosinophils Relative: 1 % (ref 0–5)
Lymphocytes Relative: 36 % (ref 12–46)
Lymphs Abs: 2.9 10*3/uL (ref 0.7–4.0)
Monocytes Absolute: 0.4 10*3/uL (ref 0.1–1.0)
Monocytes Relative: 5 % (ref 3–12)
Neutro Abs: 4.7 10*3/uL (ref 1.7–7.7)
Neutrophils Relative %: 58 % (ref 43–77)

## 2011-01-28 LAB — CBC
HCT: 40.9 % (ref 36.0–46.0)
Hemoglobin: 14.1 g/dL (ref 12.0–15.0)
MCH: 27.2 pg (ref 26.0–34.0)
MCHC: 34.5 g/dL (ref 30.0–36.0)
MCV: 79 fL (ref 78.0–100.0)
Platelets: 175 10*3/uL (ref 150–400)
RBC: 5.18 MIL/uL — ABNORMAL HIGH (ref 3.87–5.11)
RDW: 13.7 % (ref 11.5–15.5)
WBC: 8.1 10*3/uL (ref 4.0–10.5)

## 2011-01-28 LAB — COMPREHENSIVE METABOLIC PANEL
ALT: 11 U/L (ref 0–35)
AST: 13 U/L (ref 0–37)
Albumin: 3.6 g/dL (ref 3.5–5.2)
Alkaline Phosphatase: 115 U/L (ref 39–117)
BUN: 13 mg/dL (ref 6–23)
CO2: 25 mEq/L (ref 19–32)
Calcium: 9.3 mg/dL (ref 8.4–10.5)
Chloride: 96 mEq/L (ref 96–112)
Creatinine, Ser: 0.82 mg/dL (ref 0.50–1.10)
GFR calc Af Amer: >60 mL/min (ref >60)
GFR calc non Af Amer: >60 mL/min (ref >60)
Glucose, Bld: 441 mg/dL — ABNORMAL HIGH (ref 70–99)
Potassium: 4.3 mEq/L (ref 3.5–5.1)
Sodium: 132 mEq/L — ABNORMAL LOW (ref 135–145)
Total Bilirubin: 0.4 mg/dL (ref 0.3–1.2)
Total Protein: 7.4 g/dL (ref 6.0–8.3)

## 2011-01-28 LAB — CK TOTAL AND CKMB (NOT AT ARMC)
CK, MB: 1.1 ng/mL (ref 0.3–4.0)
Relative Index: INVALID (ref 0.0–2.5)
Total CK: 41 U/L (ref 7–177)

## 2011-01-28 LAB — TROPONIN I: Troponin I: <0.3 ng/mL (ref <0.30)

## 2011-01-28 LAB — GLUCOSE, CAPILLARY: Glucose-Capillary: 197 mg/dL — ABNORMAL HIGH (ref 70–99)

## 2011-03-25 LAB — DIFFERENTIAL
Basophils Absolute: 0
Basophils Relative: 0
Eosinophils Absolute: 0.1
Eosinophils Relative: 2
Lymphocytes Relative: 31
Lymphs Abs: 2
Monocytes Absolute: 0.4
Monocytes Relative: 6
Neutro Abs: 4.1
Neutrophils Relative %: 61

## 2011-03-25 LAB — URINE MICROSCOPIC-ADD ON

## 2011-03-25 LAB — COMPREHENSIVE METABOLIC PANEL
ALT: 17
AST: 12
Albumin: 3.6
Alkaline Phosphatase: 91
BUN: 12
CO2: 26
Calcium: 9.1
Chloride: 103
Creatinine, Ser: 0.69
GFR calc Af Amer: >60
GFR calc non Af Amer: >60
Glucose, Bld: 472 — ABNORMAL HIGH
Potassium: 4
Sodium: 138
Total Bilirubin: 1.1
Total Protein: 6.9

## 2011-03-25 LAB — URINALYSIS, ROUTINE W REFLEX MICROSCOPIC
Bilirubin Urine: NEGATIVE
Glucose, UA: >1000 — AB
Hgb urine dipstick: NEGATIVE
Ketones, ur: 40 — AB
Leukocytes, UA: NEGATIVE
Nitrite: NEGATIVE
Protein, ur: NEGATIVE
Specific Gravity, Urine: 1.042 — ABNORMAL HIGH
Urobilinogen, UA: 0.2
pH: 5.5

## 2011-03-25 LAB — HEMOGLOBIN A1C
Hgb A1c MFr Bld: 13.8 — ABNORMAL HIGH
Mean Plasma Glucose: 414

## 2011-03-25 LAB — PROTIME-INR
INR: 1
Prothrombin Time: 13.1

## 2011-03-25 LAB — APTT: aPTT: 25

## 2011-03-25 LAB — CBC
HCT: 41.5
Hemoglobin: 14.1
MCHC: 33.9
MCV: 80.9
Platelets: 207
RBC: 5.13 — ABNORMAL HIGH
RDW: 14.2
WBC: 6.7

## 2011-03-25 LAB — LIPASE, BLOOD: Lipase: 25

## 2011-04-01 LAB — URINALYSIS, ROUTINE W REFLEX MICROSCOPIC
Bilirubin Urine: NEGATIVE
Glucose, UA: >1000 — AB
Hgb urine dipstick: NEGATIVE
Ketones, ur: NEGATIVE
Leukocytes, UA: NEGATIVE
Nitrite: NEGATIVE
Protein, ur: NEGATIVE
Specific Gravity, Urine: 1.042 — ABNORMAL HIGH
Urobilinogen, UA: 0.2
pH: 5.5

## 2011-04-01 LAB — CBC
HCT: 41.5
Hemoglobin: 13.8
MCHC: 33.4
MCV: 81.9
Platelets: 177
RBC: 5.06
RDW: 13
WBC: 7.8

## 2011-04-01 LAB — DIFFERENTIAL
Basophils Absolute: 0
Basophils Relative: 0
Eosinophils Absolute: 0.2
Eosinophils Relative: 2
Lymphocytes Relative: 40
Lymphs Abs: 3.1
Monocytes Absolute: 0.5
Monocytes Relative: 6
Neutro Abs: 3.9
Neutrophils Relative %: 51

## 2011-04-01 LAB — POCT I-STAT, CHEM 8
BUN: 15
Calcium, Ion: 1.08 — ABNORMAL LOW
Chloride: 103
Creatinine, Ser: 0.8
Glucose, Bld: 340 — ABNORMAL HIGH
HCT: 43
Hemoglobin: 14.6
Potassium: 3.8
Sodium: 136
TCO2: 25

## 2011-04-01 LAB — URINE MICROSCOPIC-ADD ON

## 2011-04-02 LAB — CBC
HCT: 38.8
HCT: 43.2
Hemoglobin: 12.9
Hemoglobin: 14.1
MCHC: 33.3
MCHC: 32.7
MCV: 81.7
MCV: 84
Platelets: 162
Platelets: 181
RBC: 4.74
RBC: 5.14 — ABNORMAL HIGH
RDW: 13.2
RDW: 14.1
WBC: 5.3
WBC: 8.1

## 2011-04-02 LAB — PREGNANCY, URINE: Preg Test, Ur: NEGATIVE

## 2011-04-02 LAB — POCT I-STAT, CHEM 8
BUN: 11
BUN: 13
Calcium, Ion: 1.1 — ABNORMAL LOW
Calcium, Ion: 1.14
Chloride: 101
Chloride: 104
Creatinine, Ser: 0.7
Creatinine, Ser: 0.8
Glucose, Bld: 398 — ABNORMAL HIGH
Glucose, Bld: 262 — ABNORMAL HIGH
HCT: 40
HCT: 40
Hemoglobin: 13.6
Hemoglobin: 13.6
Potassium: 3.9
Potassium: 3.9
Sodium: 135
Sodium: 135
TCO2: 25
TCO2: 24

## 2011-04-02 LAB — POCT I-STAT 3, VENOUS BLOOD GAS (G3P V)
Acid-Base Excess: 1
Bicarbonate: 26.4 — ABNORMAL HIGH
O2 Saturation: 95
TCO2: 28
pCO2, Ven: 43.6 — ABNORMAL LOW
pH, Ven: 7.389 — ABNORMAL HIGH
pO2, Ven: 78 — ABNORMAL HIGH

## 2011-04-02 LAB — DIFFERENTIAL
Basophils Absolute: 0
Basophils Absolute: 0
Basophils Relative: 1
Basophils Relative: 1
Eosinophils Absolute: 0.1
Eosinophils Absolute: 0.1
Eosinophils Relative: 2
Eosinophils Relative: 2
Lymphocytes Relative: 32
Lymphocytes Relative: 36
Lymphs Abs: 1.7
Lymphs Abs: 2.9
Monocytes Absolute: 0.4
Monocytes Absolute: 0.5
Monocytes Relative: 7
Monocytes Relative: 6
Neutro Abs: 3.1
Neutro Abs: 4.6
Neutrophils Relative %: 58
Neutrophils Relative %: 56

## 2011-04-02 LAB — URINE MICROSCOPIC-ADD ON

## 2011-04-02 LAB — URINALYSIS, ROUTINE W REFLEX MICROSCOPIC
Bilirubin Urine: NEGATIVE
Bilirubin Urine: NEGATIVE
Glucose, UA: >1000 — AB
Glucose, UA: >1000 — AB
Hgb urine dipstick: NEGATIVE
Hgb urine dipstick: NEGATIVE
Ketones, ur: 15 — AB
Ketones, ur: NEGATIVE
Leukocytes, UA: NEGATIVE
Nitrite: POSITIVE — AB
Nitrite: NEGATIVE
Protein, ur: NEGATIVE
Protein, ur: NEGATIVE
Specific Gravity, Urine: 1.044 — ABNORMAL HIGH
Specific Gravity, Urine: 1.03
Urobilinogen, UA: 0.2
Urobilinogen, UA: 0.2
pH: 5.5
pH: 6

## 2011-04-02 LAB — URINE CULTURE: Colony Count: >=100000

## 2011-04-02 LAB — KETONES, QUALITATIVE: Acetone, Bld: NEGATIVE

## 2011-04-02 LAB — LIPASE, BLOOD: Lipase: 22

## 2011-04-05 LAB — COMPREHENSIVE METABOLIC PANEL
ALT: 12
AST: 13
Albumin: 3.2 — ABNORMAL LOW
Alkaline Phosphatase: 119 — ABNORMAL HIGH
BUN: 10
CO2: 24
Calcium: 8.8
Chloride: 103
Creatinine, Ser: 0.76
GFR calc Af Amer: >60
GFR calc non Af Amer: >60
Glucose, Bld: 308 — ABNORMAL HIGH
Potassium: 3.7
Sodium: 136
Total Bilirubin: 0.4
Total Protein: 6

## 2011-04-05 LAB — URINALYSIS, ROUTINE W REFLEX MICROSCOPIC
Bilirubin Urine: NEGATIVE
Glucose, UA: >1000 — AB
Ketones, ur: NEGATIVE
Nitrite: NEGATIVE
Protein, ur: NEGATIVE
Specific Gravity, Urine: >1.046 — ABNORMAL HIGH
Urobilinogen, UA: 1
pH: 5.5

## 2011-04-05 LAB — DIFFERENTIAL
Basophils Absolute: 0
Basophils Relative: 0
Eosinophils Absolute: 0.1
Eosinophils Relative: 2
Lymphocytes Relative: 41
Lymphs Abs: 3.3
Monocytes Absolute: 0.5
Monocytes Relative: 7
Neutro Abs: 3.9
Neutrophils Relative %: 50

## 2011-04-05 LAB — GC/CHLAMYDIA PROBE AMP, GENITAL
Chlamydia, DNA Probe: NEGATIVE
GC Probe Amp, Genital: NEGATIVE

## 2011-04-05 LAB — POCT I-STAT, CHEM 8
BUN: 10
Calcium, Ion: 1.18
Chloride: 104
Creatinine, Ser: 0.8
Glucose, Bld: 307 — ABNORMAL HIGH
HCT: 39
Hemoglobin: 13.3
Potassium: 3.9
Sodium: 138
TCO2: 24

## 2011-04-05 LAB — WET PREP, GENITAL: Yeast Wet Prep HPF POC: NONE SEEN

## 2011-04-05 LAB — URINE MICROSCOPIC-ADD ON

## 2011-04-05 LAB — CBC
HCT: 39.6
Hemoglobin: 13
MCHC: 32.8
MCV: 84.5
Platelets: 159
RBC: 4.69
RDW: 13.5
WBC: 7.9

## 2011-04-05 LAB — OCCULT BLOOD X 1 CARD TO LAB, STOOL: Fecal Occult Bld: NEGATIVE

## 2011-04-09 LAB — CLOTEST (H. PYLORI), BIOPSY: Helicobacter screen: POSITIVE — AB

## 2011-04-12 LAB — URINALYSIS, ROUTINE W REFLEX MICROSCOPIC
Bilirubin Urine: NEGATIVE
Glucose, UA: >1000 — AB
Hgb urine dipstick: NEGATIVE
Ketones, ur: NEGATIVE
Leukocytes, UA: NEGATIVE
Nitrite: NEGATIVE
Protein, ur: NEGATIVE
Specific Gravity, Urine: 1.046 — ABNORMAL HIGH
Urobilinogen, UA: 1
pH: 7

## 2011-04-12 LAB — COMPREHENSIVE METABOLIC PANEL
ALT: 16
AST: 14
Albumin: 3.5
Alkaline Phosphatase: 90
BUN: 10
CO2: 26
Calcium: 9
Chloride: 100
Creatinine, Ser: 0.7
GFR calc Af Amer: >60
GFR calc non Af Amer: >60
Glucose, Bld: 421 — ABNORMAL HIGH
Potassium: 3.9
Sodium: 136
Total Bilirubin: 1
Total Protein: 6.4

## 2011-04-12 LAB — DIFFERENTIAL
Basophils Absolute: 0
Basophils Relative: 0
Eosinophils Absolute: 0.2
Eosinophils Relative: 2
Lymphocytes Relative: 32
Lymphs Abs: 2.4
Monocytes Absolute: 0.6
Monocytes Relative: 8
Neutro Abs: 4.4
Neutrophils Relative %: 57

## 2011-04-12 LAB — CBC
HCT: 40.8
Hemoglobin: 13.4
MCHC: 32.9
MCV: 83
Platelets: 206
RBC: 4.91
RDW: 13
WBC: 7.7

## 2011-04-12 LAB — URINE MICROSCOPIC-ADD ON

## 2011-04-13 LAB — CBC
HCT: 34.2 — ABNORMAL LOW
HCT: 35.6 — ABNORMAL LOW
HCT: 36
HCT: 39.4
HCT: 44.1
Hemoglobin: 11.3 — ABNORMAL LOW
Hemoglobin: 11.7 — ABNORMAL LOW
Hemoglobin: 12
Hemoglobin: 13
Hemoglobin: 14.6
MCHC: 32.8
MCHC: 32.9
MCHC: 33.1
MCHC: 33.2
MCHC: 33.3
MCV: 82.1
MCV: 82.2
MCV: 82.2
MCV: 82.9
MCV: 83.8
Platelets: 165
Platelets: 180
Platelets: 191
Platelets: 211
Platelets: 217
RBC: 4.16
RBC: 4.25
RBC: 4.38
RBC: 4.75
RBC: 5.37 — ABNORMAL HIGH
RDW: 12.9
RDW: 13
RDW: 13
RDW: 13.2
RDW: 13.3
WBC: 5.4
WBC: 5.4
WBC: 5.8
WBC: 5.9
WBC: 7.1

## 2011-04-13 LAB — COMPREHENSIVE METABOLIC PANEL
ALT: 12
ALT: 12
ALT: 13
AST: 12
AST: 13
AST: 15
Albumin: 2.8 — ABNORMAL LOW
Albumin: 3.3 — ABNORMAL LOW
Albumin: 3.8
Alkaline Phosphatase: 103
Alkaline Phosphatase: 63
Alkaline Phosphatase: 84
BUN: 11
BUN: 9
BUN: 9
CO2: 24
CO2: 24
CO2: 26
Calcium: 7.9 — ABNORMAL LOW
Calcium: 8.5
Calcium: 8.7
Chloride: 101
Chloride: 107
Chloride: 99
Creatinine, Ser: 0.55
Creatinine, Ser: 0.6
Creatinine, Ser: 0.62
GFR calc Af Amer: >60
GFR calc Af Amer: >60
GFR calc Af Amer: >60
GFR calc non Af Amer: >60
GFR calc non Af Amer: >60
GFR calc non Af Amer: >60
Glucose, Bld: 143 — ABNORMAL HIGH
Glucose, Bld: 319 — ABNORMAL HIGH
Glucose, Bld: 330 — ABNORMAL HIGH
Potassium: 3.4 — ABNORMAL LOW
Potassium: 3.5
Potassium: 3.8
Sodium: 132 — ABNORMAL LOW
Sodium: 133 — ABNORMAL LOW
Sodium: 136
Total Bilirubin: 0.7
Total Bilirubin: 0.8
Total Bilirubin: 0.9
Total Protein: 5.3 — ABNORMAL LOW
Total Protein: 6.5
Total Protein: 7.4

## 2011-04-13 LAB — BASIC METABOLIC PANEL
BUN: 10
BUN: 3 — ABNORMAL LOW
CO2: 25
CO2: 26
Calcium: 7.9 — ABNORMAL LOW
Calcium: 8.2 — ABNORMAL LOW
Chloride: 107
Chloride: 109
Creatinine, Ser: 0.61
Creatinine, Ser: 0.67
GFR calc Af Amer: >60
GFR calc Af Amer: >60
GFR calc non Af Amer: >60
GFR calc non Af Amer: >60
Glucose, Bld: 101 — ABNORMAL HIGH
Glucose, Bld: 177 — ABNORMAL HIGH
Potassium: 3.7
Potassium: 3.8
Sodium: 139
Sodium: 139

## 2011-04-13 LAB — URINALYSIS, ROUTINE W REFLEX MICROSCOPIC
Bilirubin Urine: NEGATIVE
Bilirubin Urine: NEGATIVE
Glucose, UA: >1000 — AB
Glucose, UA: >1000 — AB
Hgb urine dipstick: NEGATIVE
Hgb urine dipstick: NEGATIVE
Ketones, ur: 15 — AB
Ketones, ur: NEGATIVE
Leukocytes, UA: NEGATIVE
Leukocytes, UA: NEGATIVE
Nitrite: NEGATIVE
Nitrite: NEGATIVE
Protein, ur: NEGATIVE
Protein, ur: NEGATIVE
Specific Gravity, Urine: 1.038 — ABNORMAL HIGH
Specific Gravity, Urine: 1.044 — ABNORMAL HIGH
Urobilinogen, UA: 0.2
Urobilinogen, UA: 1
pH: 5
pH: 6.5

## 2011-04-13 LAB — DIFFERENTIAL
Basophils Absolute: 0
Basophils Absolute: 0.1
Basophils Relative: 1
Basophils Relative: 1
Eosinophils Absolute: 0.1 — ABNORMAL LOW
Eosinophils Absolute: 0.1 — ABNORMAL LOW
Eosinophils Relative: 2
Eosinophils Relative: 3
Lymphocytes Relative: 35
Lymphocytes Relative: 39
Lymphs Abs: 2.1
Lymphs Abs: 2.5
Monocytes Absolute: 0.4
Monocytes Absolute: 0.5
Monocytes Relative: 6
Monocytes Relative: 9
Neutro Abs: 2.7
Neutro Abs: 4
Neutrophils Relative %: 49
Neutrophils Relative %: 56

## 2011-04-13 LAB — POCT PREGNANCY, URINE
Operator id: 198171
Preg Test, Ur: NEGATIVE

## 2011-04-13 LAB — HEMOGLOBIN A1C
Hgb A1c MFr Bld: 13.9 — ABNORMAL HIGH
Mean Plasma Glucose: 418

## 2011-04-13 LAB — CARDIAC PANEL(CRET KIN+CKTOT+MB+TROPI)
CK, MB: 0.4
CK, MB: 0.5
CK, MB: 0.5
CK, MB: 0.6
CK, MB: 0.6
CK, MB: 0.6
Relative Index: INVALID
Relative Index: INVALID
Relative Index: INVALID
Relative Index: INVALID
Relative Index: INVALID
Relative Index: INVALID
Total CK: 31
Total CK: 35
Total CK: 37
Total CK: 40
Total CK: 41
Total CK: 49
Troponin I: 0.01
Troponin I: 0.01
Troponin I: 0.01
Troponin I: 0.02
Troponin I: 0.03
Troponin I: <0.01

## 2011-04-13 LAB — URINE DRUGS OF ABUSE SCREEN W ALC, ROUTINE (REF LAB)
Amphetamine Screen, Ur: NEGATIVE
Barbiturate Quant, Ur: NEGATIVE
Benzodiazepines.: NEGATIVE
Cocaine Metabolites: NEGATIVE
Creatinine,U: 87.1
Ethyl Alcohol: <5
Marijuana Metabolite: NEGATIVE
Methadone: NEGATIVE
Opiate Screen, Urine: NEGATIVE
Phencyclidine (PCP): NEGATIVE
Propoxyphene: POSITIVE — AB

## 2011-04-13 LAB — CK TOTAL AND CKMB (NOT AT ARMC)
CK, MB: 0.4
Relative Index: INVALID
Total CK: 34

## 2011-04-13 LAB — URINE MICROSCOPIC-ADD ON

## 2011-04-13 LAB — TROPONIN I: Troponin I: 0.01

## 2011-04-13 LAB — LIPASE, BLOOD
Lipase: 23
Lipase: 23

## 2011-04-13 LAB — RHEUMATOID FACTOR: Rhuematoid fact SerPl-aCnc: <20

## 2011-04-13 LAB — PROPOXYPHENE, CONFIRMATION
Propoxyphene Metabolite: 14000 ng/mL
Propoxyphene or Metab. GC/MS: NEGATIVE

## 2011-04-13 LAB — OCCULT BLOOD X 1 CARD TO LAB, STOOL: Fecal Occult Bld: NEGATIVE

## 2011-04-13 LAB — SEDIMENTATION RATE: Sed Rate: 8

## 2011-04-13 LAB — MICROALBUMIN, URINE: Microalb, Ur: 0.9

## 2011-04-13 LAB — ANA: Anti Nuclear Antibody(ANA): NEGATIVE

## 2011-04-13 LAB — CREATININE, URINE, RANDOM: Creatinine, Urine: 86.4

## 2011-05-04 ENCOUNTER — Inpatient Hospital Stay (INDEPENDENT_AMBULATORY_CARE_PROVIDER_SITE_OTHER)
Admission: RE | Admit: 2011-05-04 | Discharge: 2011-05-04 | Disposition: A | Discharge status: Home / Self Care | Payer: BC Managed Care – PPO | Source: Ambulatory Visit | Attending: Family Medicine | Admitting: Family Medicine

## 2011-05-04 DIAGNOSIS — J069 Acute upper respiratory infection, unspecified: Secondary | ICD-10-CM

## 2011-05-04 DIAGNOSIS — J029 Acute pharyngitis, unspecified: Secondary | ICD-10-CM

## 2011-07-31 ENCOUNTER — Encounter (HOSPITAL_COMMUNITY): Payer: Self-pay | Admitting: Emergency Medicine

## 2011-07-31 ENCOUNTER — Emergency Department (HOSPITAL_COMMUNITY)
Admission: EM | Admit: 2011-07-31 | Discharge: 2011-07-31 | Disposition: A | Discharge status: Home / Self Care | Payer: Self-pay | Attending: Emergency Medicine | Admitting: Emergency Medicine

## 2011-07-31 DIAGNOSIS — Z7982 Long term (current) use of aspirin: Secondary | ICD-10-CM | POA: Insufficient documentation

## 2011-07-31 DIAGNOSIS — R05 Cough: Secondary | ICD-10-CM | POA: Insufficient documentation

## 2011-07-31 DIAGNOSIS — Z794 Long term (current) use of insulin: Secondary | ICD-10-CM | POA: Insufficient documentation

## 2011-07-31 DIAGNOSIS — J3489 Other specified disorders of nose and nasal sinuses: Secondary | ICD-10-CM | POA: Insufficient documentation

## 2011-07-31 DIAGNOSIS — J329 Chronic sinusitis, unspecified: Secondary | ICD-10-CM | POA: Insufficient documentation

## 2011-07-31 DIAGNOSIS — R070 Pain in throat: Secondary | ICD-10-CM | POA: Insufficient documentation

## 2011-07-31 DIAGNOSIS — J069 Acute upper respiratory infection, unspecified: Secondary | ICD-10-CM | POA: Insufficient documentation

## 2011-07-31 DIAGNOSIS — IMO0001 Reserved for inherently not codable concepts without codable children: Secondary | ICD-10-CM | POA: Insufficient documentation

## 2011-07-31 DIAGNOSIS — E119 Type 2 diabetes mellitus without complications: Secondary | ICD-10-CM | POA: Insufficient documentation

## 2011-07-31 DIAGNOSIS — R059 Cough, unspecified: Secondary | ICD-10-CM | POA: Insufficient documentation

## 2011-07-31 MED ORDER — IBUPROFEN 200 MG PO TABS
600.0000 mg | ORAL_TABLET | Freq: Once | ORAL | Status: AC
  Administered 2011-07-31: 600 mg via ORAL
  Filled 2011-07-31: qty 3

## 2011-07-31 MED ORDER — AMOXICILLIN 500 MG PO CAPS: 500.0000 mg | ORAL_CAPSULE | Freq: Three times a day (TID) | ORAL | Status: AC

## 2011-07-31 MED ORDER — CETIRIZINE-PSEUDOEPHEDRINE ER 5-120 MG PO TB12: 1.0000 | ORAL_TABLET | Freq: Two times a day (BID) | ORAL | Status: DC | PRN

## 2011-07-31 NOTE — ED Provider Notes (Signed)
History     CSN: [id number]  Arrival date & time 07/31/11  1251   First MD Initiated Contact with Patient 07/31/11 1328      Chief Complaint  Patient presents with  . Sinusitis    (Consider location/radiation/quality/duration/timing/severity/associated sxs/prior treatment) Patient is a 46 y.o. female presenting with sinusitis. The history is provided by the patient.  Sinusitis  Pertinent negatives include no chills and no shortness of breath.  pt c/o sinus pain and congestion for past few days. Constant, dull pain. No specific exacerbating or alleviating factors. Located in frontal and maxillary sinus area. Notes hx sinusitis. No recent abx rx. Also notes recent scratchy/sore throat, occasional non productive cough, and body aches. No known flu exposure. No severe headaches or neck pain. No eye pain. No difficulty breathing or swallowing. subj fever. No chills/sweats.   Past Medical History  Diagnosis Date  . Diabetes mellitus     No past surgical history on file.  No family history on file.  History  Substance Use Topics  . Smoking status: Not on file  . Smokeless tobacco: Not on file  . Alcohol Use: No    OB History    Grav Para Term Preterm Abortions TAB SAB Ect Mult Living                  Review of Systems  Constitutional: Negative for chills.  HENT: Negative for neck pain and neck stiffness.   Eyes: Negative for redness and visual disturbance.  Respiratory: Negative for shortness of breath.   Cardiovascular: Negative for chest pain.  Gastrointestinal: Negative for abdominal pain.  Genitourinary: Negative for flank pain.  Musculoskeletal: Negative for back pain.  Skin: Negative for rash.  Neurological: Negative for weakness and numbness.  Hematological: Does not bruise/bleed easily.  Psychiatric/Behavioral: Negative for confusion.    Allergies  Review of patient's allergies indicates no known allergies.  Home Medications   Current Outpatient Rx    Name Route Sig Dispense Refill  . ASPIRIN EC 81 MG PO TBEC Oral Take 81 mg by mouth daily.    . INSULIN ASPART 100 UNIT/ML [place] SOLN Subcutaneous Inject 15-50 Units into the skin 3 (three) times daily before meals.      There were no vitals taken for this visit.  Physical Exam  Nursing note and vitals reviewed. Constitutional: She is oriented to person, place, and time. She appears well-developed and well-nourished. No distress.  HENT:  Head: Atraumatic.  Right Ear: External ear normal.  Left Ear: External ear normal.  Mouth/Throat: Oropharynx is clear and moist.       Nasal congestion. bil max and frontal sinus tenderness  Eyes: Conjunctivae are normal. No scleral icterus.  Neck: Normal range of motion. Neck supple. No tracheal deviation present.       No stiffness or rigidity  Cardiovascular: Normal rate, regular rhythm, normal heart sounds and intact distal pulses.  Exam reveals no gallop and no friction rub.   No murmur heard. Pulmonary/Chest: Effort normal and breath sounds normal. No respiratory distress.  Abdominal: Soft. Normal appearance and bowel sounds are normal. She exhibits no distension. There is no tenderness.  Musculoskeletal: She exhibits no edema and no tenderness.  Neurological: She is alert and oriented to person, place, and time.       Motor intact. Steady gait.   Skin: Skin is warm and dry. No rash noted.  Psychiatric: She has a normal mood and affect.    ED Course  Procedures (including critical  care time)    MDM  Given sinus congestion/pressure/pain/tenderness, will rx sinusitis.   Confirmed nkda w pt.         Mirna Mires, MD 07/31/11 1340

## 2011-07-31 NOTE — ED Notes (Signed)
Pt. Stated, i started with a scratchy throat now its move up in my sinus and my head is killing me.

## 2011-08-30 ENCOUNTER — Encounter (HOSPITAL_COMMUNITY): Payer: Self-pay | Admitting: *Deleted

## 2011-08-30 ENCOUNTER — Other Ambulatory Visit: Payer: Self-pay

## 2011-08-30 ENCOUNTER — Emergency Department (HOSPITAL_COMMUNITY): Payer: Self-pay

## 2011-08-30 ENCOUNTER — Emergency Department (HOSPITAL_COMMUNITY)
Admission: EM | Admit: 2011-08-30 | Discharge: 2011-08-30 | Disposition: A | Discharge status: Home / Self Care | Payer: Self-pay | Attending: Emergency Medicine | Admitting: Emergency Medicine

## 2011-08-30 DIAGNOSIS — Z794 Long term (current) use of insulin: Secondary | ICD-10-CM | POA: Insufficient documentation

## 2011-08-30 DIAGNOSIS — E119 Type 2 diabetes mellitus without complications: Secondary | ICD-10-CM | POA: Insufficient documentation

## 2011-08-30 DIAGNOSIS — Z7982 Long term (current) use of aspirin: Secondary | ICD-10-CM | POA: Insufficient documentation

## 2011-08-30 DIAGNOSIS — R739 Hyperglycemia, unspecified: Secondary | ICD-10-CM

## 2011-08-30 DIAGNOSIS — R0789 Other chest pain: Secondary | ICD-10-CM | POA: Insufficient documentation

## 2011-08-30 LAB — CBC
HCT: 39.5 % (ref 36.0–46.0)
Hemoglobin: 13.6 g/dL (ref 12.0–15.0)
MCH: 27.9 pg (ref 26.0–34.0)
MCHC: 34.4 g/dL (ref 30.0–36.0)
MCV: 81.1 fL (ref 78.0–100.0)
Platelets: 181 10*3/uL (ref 150–400)
RBC: 4.87 MIL/uL (ref 3.87–5.11)
RDW: 13 % (ref 11.5–15.5)
WBC: 5.7 10*3/uL (ref 4.0–10.5)

## 2011-08-30 LAB — BASIC METABOLIC PANEL
BUN: 11 mg/dL (ref 6–23)
CO2: 23 mEq/L (ref 19–32)
Calcium: 8.7 mg/dL (ref 8.4–10.5)
Chloride: 100 mEq/L (ref 96–112)
Creatinine, Ser: 0.62 mg/dL (ref 0.50–1.10)
GFR calc Af Amer: >90 mL/min (ref >90)
GFR calc non Af Amer: >90 mL/min (ref >90)
Glucose, Bld: 492 mg/dL — ABNORMAL HIGH (ref 70–99)
Potassium: 4.5 mEq/L (ref 3.5–5.1)
Sodium: 132 mEq/L — ABNORMAL LOW (ref 135–145)

## 2011-08-30 LAB — GLUCOSE, CAPILLARY: Glucose-Capillary: 396 mg/dL — ABNORMAL HIGH (ref 70–99)

## 2011-08-30 LAB — CARDIAC PANEL(CRET KIN+CKTOT+MB+TROPI)
CK, MB: 1.4 ng/mL (ref 0.3–4.0)
Relative Index: INVALID (ref 0.0–2.5)
Total CK: 33 U/L (ref 7–177)
Troponin I: <0.3 ng/mL (ref <0.30)

## 2011-08-30 LAB — TROPONIN I: Troponin I: <0.3 ng/mL (ref <0.30)

## 2011-08-30 LAB — PRO B NATRIURETIC PEPTIDE: Pro B Natriuretic peptide (BNP): 32 pg/mL (ref 0–125)

## 2011-08-30 MED ORDER — ASPIRIN 325 MG PO TABS
325.0000 mg | ORAL_TABLET | ORAL | Status: AC
  Administered 2011-08-30: 325 mg via ORAL
  Filled 2011-08-30: qty 1

## 2011-08-30 MED ORDER — SODIUM CHLORIDE 0.9 % IV BOLUS (SEPSIS)
1000.0000 mL | Freq: Once | INTRAVENOUS | Status: AC
  Administered 2011-08-30: 1000 mL via INTRAVENOUS

## 2011-08-30 NOTE — ED Provider Notes (Signed)
History     CSN: [id number]  Arrival date & time 08/30/11  1308   First MD Initiated Contact with Patient 08/30/11 1720      Chief Complaint  Patient presents with  . Chest Pain    (Consider location/radiation/quality/duration/timing/severity/associated sxs/prior treatment) Patient is a 46 y.o. female presenting with chest pain. The history is provided by the patient.  Chest Pain The chest pain began 3 - 5 hours ago. Duration of episode(s) is 5 seconds. Chest pain occurs intermittently. The chest pain is improving. Associated with: nothing. At its most intense, the pain is at 6/10. The pain is currently at 0/10. The severity of the pain is moderate. The quality of the pain is described as sharp and pressure-like. The pain does not radiate. Exacerbated by: not related to food or position. Pertinent negatives for primary symptoms include no fever, no shortness of breath, no cough, no abdominal pain, no nausea and no vomiting.  Pertinent negatives for associated symptoms include no diaphoresis, no lower extremity edema and no near-syncope. She tried nothing for the symptoms.  Her past medical history is significant for diabetes.  Procedure history is positive for cardiac catheterization.     Past Medical History  Diagnosis Date  . Diabetes mellitus     Past Surgical History  Procedure Date  . Abdominal hysterectomy   . Tubal ligation   . Tonsillectomy   . Cesarean section   . Ganglion cyst excision     No family history on file.  History  Substance Use Topics  . Smoking status: Not on file  . Smokeless tobacco: Not on file  . Alcohol Use: No    OB History    Grav Para Term Preterm Abortions TAB SAB Ect Mult Living                  Review of Systems  Constitutional: Negative for fever and diaphoresis.  Respiratory: Negative for cough and shortness of breath.   Cardiovascular: Positive for chest pain. Negative for near-syncope.  Gastrointestinal: Negative for  nausea, vomiting and abdominal pain.  All other systems reviewed and are negative.    Allergies  Review of patient's allergies indicates no known allergies.  Home Medications   Current Outpatient Rx  Name Route Sig Dispense Refill  . ASPIRIN EC 81 MG PO TBEC Oral Take 81 mg by mouth daily.    . INSULIN ASPART 100 UNIT/ML [place] SOLN Subcutaneous Inject 15-50 Units into the skin 3 (three) times daily before meals.      BP 163/93  Pulse 50  Temp(Src) 97.9 F (36.6 C) (Oral)  Resp 20  Ht 5' (1.524 m)  Wt 160 lb (72.576 kg)  BMI 31.25 kg/m2  SpO2 100%  Physical Exam  Nursing note and vitals reviewed. Constitutional: She is oriented to person, place, and time. She appears well-developed and well-nourished. No distress.  HENT:  Head: Normocephalic and atraumatic.  Mouth/Throat: Oropharynx is clear and moist.  Eyes: Conjunctivae and EOM are normal. Pupils are equal, round, and reactive to light.  Neck: Normal range of motion. Neck supple.  Cardiovascular: Normal rate, regular rhythm and intact distal pulses.   No murmur heard. Pulmonary/Chest: Effort normal and breath sounds normal. No respiratory distress. She has no wheezes. She has no rales. She exhibits no tenderness.  Abdominal: Soft. She exhibits no distension. There is no tenderness. There is no rebound and no guarding.  Musculoskeletal: Normal range of motion. She exhibits no edema and no tenderness.  Neurological:  She is alert and oriented to person, place, and time.  Skin: Skin is warm and dry. No rash noted. No erythema.  Psychiatric: She has a normal mood and affect. Her behavior is normal.    ED Course  Procedures (including critical care time)  Labs Reviewed  BASIC METABOLIC PANEL - Abnormal; Notable for the following:    Sodium 132 (*)    Glucose, Bld 492 (*)    All other components within normal limits  GLUCOSE, CAPILLARY - Abnormal; Notable for the following:    Glucose-Capillary 396 (*)    All other  components within normal limits  CBC  PRO B NATRIURETIC PEPTIDE  TROPONIN I  CARDIAC PANEL(CRET KIN+CKTOT+MB+TROPI)  LAB REPORT - SCANNED   Dg Chest 2 View  08/30/2011  *RADIOLOGY REPORT*  Clinical Data: Mid chest pain and shortness of breath.  CHEST - 2 VIEW  Comparison: 01/28/2011.  Findings: The cardiac silhouette, mediastinal and hilar contours are within normal limits and stable. The lungs are clear.  No pleural effusions.  The bony thorax is intact  IMPRESSION: Normal chest x-ray.  No change since prior study.  Original Report Authenticated By: P. Kalman Jewels, M.D.    Date: 08/30/2011  Rate: 52  Rhythm: sinus bradycardia  QRS Axis: normal  Intervals: normal  ST/T Wave abnormalities: normal  Conduction Disutrbances:none  Narrative Interpretation:   Old EKG Reviewed: unchanged    1. Hyperglycemia   2. Atypical chest pain       MDM   Patient with a history of intermittent chest pain since noon. It's sharp in nature and lasts a few seconds and then resolves and comes back. She denies any association with food however no associated symptoms. Symptoms seemed to be a low likelihood for cardiac disease. She does have a history of diabetes and is a TIMI 1 for daily aspirin use.  Patient had a catheterization in 2011 which showed a normal cardiac cath with normal coronary arteries. Patient has no abdominal pain or symptoms suggestive of GI pathology today. Her EKG is within normal limits. However she is hyperglycemic today with a sugar of almost 500 and feel that car to person. Will hydrate and work on improving her blood sugar and will check a second set of enzymes.  CBC, BMP, BNP, troponin, chest x-ray all within normal limits. Other than hyperglycemia.   7:14 PM Repeat enzymes within normal limits. After fluids repeat blood sugar improving and that was after a meal. After speaking with patient it sounds that over the last 3 months she has lost her job and her insurance and she  has no way to obtain her insulin. She states she's running low and has no more insulin available.  Will have case management from by and speak with patient and work on referrals for a PCP.     Blanchie Dessert, MD 09/02/11 [id number]

## 2011-08-30 NOTE — ED Notes (Signed)
Called pt again.  Pt was in [facility] in triage.

## 2011-08-30 NOTE — ED Notes (Signed)
Will ensure that social work is coming to speak with pt to help her with insulin

## 2011-08-30 NOTE — Discharge Instructions (Signed)
Chest Pain (Nonspecific) It is often hard to give a specific diagnosis for the cause of chest pain. There is always a chance that your pain could be related to something serious, such as a heart attack or a blood clot in the lungs. You need to follow up with your caregiver for further evaluation. CAUSES   Heartburn.   Pneumonia or bronchitis.   Anxiety and stress.   Inflammation around your heart (pericarditis) or lung (pleuritis or pleurisy).   A blood clot in the lung.   A collapsed lung (pneumothorax). It can develop suddenly on its own (spontaneous pneumothorax) or from injury (trauma) to the chest.  The chest wall is composed of bones, muscles, and cartilage. Any of these can be the source of the pain.  The bones can be bruised by injury.   The muscles or cartilage can be strained by coughing or overwork.   The cartilage can be affected by inflammation and become sore (costochondritis).  DIAGNOSIS  Lab tests or other studies, such as X-rays, an EKG, stress testing, or cardiac imaging, may be needed to find the cause of your pain.  TREATMENT   Treatment depends on what may be causing your chest pain. Treatment may include:   Acid blockers for heartburn.   Anti-inflammatory medicine.   Pain medicine for inflammatory conditions.   Antibiotics if an infection is present.   You may be advised to change lifestyle habits. This includes stopping smoking and avoiding caffeine and chocolate.   You may be advised to keep your head raised (elevated) when sleeping. This reduces the chance of acid going backward from your stomach into your esophagus.   Most of the time, nonspecific chest pain will improve within 2 to 3 days with rest and mild pain medicine.  HOME CARE INSTRUCTIONS   If antibiotics were prescribed, take the full amount even if you start to feel better.   For the next few days, avoid physical activities that bring on chest pain. Continue physical activities as  directed.   Do not smoke cigarettes or drink alcohol until your symptoms are gone.   Only take over-the-counter or prescription medicine for pain, discomfort, or fever as directed by your caregiver.   Follow your caregiver's suggestions for further testing if your chest pain does not go away.   Keep any follow-up appointments you made. If you do not go to an appointment, you could develop lasting (chronic) problems with pain. If there is any problem keeping an appointment, you must call to reschedule.  SEEK MEDICAL CARE IF:   You think you are having problems from the medicine you are taking. Read your medicine instructions carefully.   Your chest pain does not go away, even after treatment.   You develop a rash with blisters on your chest.  SEEK IMMEDIATE MEDICAL CARE IF:   You have increased chest pain or pain that spreads to your arm, neck, jaw, back, or belly (abdomen).   You develop shortness of breath, an increasing cough, or you are coughing up blood.   You have severe back or abdominal pain, feel sick to your stomach (nauseous) or throw up (vomit).   You develop severe weakness, fainting, or chills.   You have an oral temperature above 102 F (38.9 C), not controlled by medicine.  THIS IS AN EMERGENCY. Do not wait to see if the pain will go away. Get medical help at once. Call your local emergency services (911 in [place] Do not drive yourself to   the hospital. MAKE SURE YOU:   Understand these instructions.   Will watch your condition.   Will get help right away if you are not doing well or get worse.  Document Released: 03/31/2005 Document Revised: 03/03/2011 Document Reviewed: 01/25/2008 [id number] Patient Information 2012 [place]. Case management will call you in the morning and they will help you by setting an appointment with [facility], so that you can get your insulin on a regular basis

## 2011-08-30 NOTE — ED Notes (Signed)
Bolus started, unable to chart as scanner is not working

## 2011-08-30 NOTE — ED Notes (Signed)
Patient states she woke with a grabbing pain in the center of her chest.  Patient states she feels weak and has a heaviness in her chest.  Patient also reports she is sob

## 2011-12-25 ENCOUNTER — Encounter (HOSPITAL_COMMUNITY): Payer: Self-pay | Admitting: Emergency Medicine

## 2011-12-25 ENCOUNTER — Emergency Department (HOSPITAL_COMMUNITY): Payer: Self-pay

## 2011-12-25 ENCOUNTER — Emergency Department (HOSPITAL_COMMUNITY)
Admission: EM | Admit: 2011-12-25 | Discharge: 2011-12-25 | Disposition: A | Discharge status: Home / Self Care | Payer: Self-pay | Attending: Emergency Medicine | Admitting: Emergency Medicine

## 2011-12-25 DIAGNOSIS — E119 Type 2 diabetes mellitus without complications: Secondary | ICD-10-CM | POA: Insufficient documentation

## 2011-12-25 DIAGNOSIS — J40 Bronchitis, not specified as acute or chronic: Secondary | ICD-10-CM | POA: Insufficient documentation

## 2011-12-25 MED ORDER — AZITHROMYCIN 250 MG PO TABS
500.0000 mg | ORAL_TABLET | Freq: Once | ORAL | Status: AC
  Administered 2011-12-25: 500 mg via ORAL
  Filled 2011-12-25: qty 1

## 2011-12-25 MED ORDER — PROMETHAZINE-DM 6.25-15 MG/5ML PO SYRP: 2.5000 mL | ORAL_SOLUTION | Freq: Four times a day (QID) | ORAL | Status: AC | PRN

## 2011-12-25 MED ORDER — IPRATROPIUM BROMIDE 0.02 % IN SOLN
0.5000 mg | Freq: Once | RESPIRATORY_TRACT | Status: AC
  Administered 2011-12-25: 0.5 mg via RESPIRATORY_TRACT
  Filled 2011-12-25: qty 2.5

## 2011-12-25 MED ORDER — ALBUTEROL SULFATE HFA 108 (90 BASE) MCG/ACT IN AERS
2.0000 | INHALATION_SPRAY | RESPIRATORY_TRACT | Status: DC | PRN
Start: 2011-12-25 — End: 2011-12-25
  Administered 2011-12-25: 2 via RESPIRATORY_TRACT
  Filled 2011-12-25: qty 6.7

## 2011-12-25 MED ORDER — ALBUTEROL SULFATE (5 MG/ML) 0.5% IN NEBU
5.0000 mg | INHALATION_SOLUTION | Freq: Once | RESPIRATORY_TRACT | Status: AC
  Administered 2011-12-25: 5 mg via RESPIRATORY_TRACT
  Filled 2011-12-25: qty 1

## 2011-12-25 MED ORDER — AZITHROMYCIN 250 MG PO TABS: 250.0000 mg | ORAL_TABLET | Freq: Every day | ORAL | Status: AC

## 2011-12-25 NOTE — ED Provider Notes (Signed)
History     CSN: [id number]  Arrival date & time 12/25/11  [id number]   First MD Initiated Contact with Patient 12/25/11 1027      Chief Complaint  Patient presents with  . Nasal Congestion    (Consider location/radiation/quality/duration/timing/severity/associated sxs/prior treatment) HPI  Patient to the ED with complaints of coughing for the past 3 weeks. She denies SOB or Chest pain.  She denies fevers, nausea, vomiting, diarrhea, sore throat, ear pain. She states that she will be fine and all of the sudden develops an "attack of coughing". The patient States that her cough is worse at night. She denies having any other complaints at this time. No lower extremity swelling or hx of DVT.  Past Medical History  Diagnosis Date  . Diabetes mellitus     Past Surgical History  Procedure Date  . Abdominal hysterectomy   . Tubal ligation   . Tonsillectomy   . Cesarean section   . Ganglion cyst excision     No family history on file.  History  Substance Use Topics  . Smoking status: Not on file  . Smokeless tobacco: Not on file  . Alcohol Use: No    OB History    Grav Para Term Preterm Abortions TAB SAB Ect Mult Living                  Review of Systems   HEENT: denies blurry vision or change in hearing PULMONARY: Denies difficulty breathing and SOB CARDIAC: denies chest pain or heart palpitations MUSCULOSKELETAL:  denies being unable to ambulate ABDOMEN AL: denies abdominal pain GU: denies loss of bowel or urinary control NEURO: denies numbness and tingling in extremities SKIN: no new rashes PSYCH: patient behavior is normal NECK: Not complaining of neck pain     Allergies  Review of patient's allergies indicates no known allergies.  Home Medications   Current Outpatient Rx  Name Route Sig Dispense Refill  . AZITHROMYCIN 250 MG PO TABS Oral Take 1 tablet (250 mg total) by mouth daily. Take first 2 tablets together, then 1 every day until finished. 5 tablet  0    1 tab PO q day starting on 12/26/2011  . PROMETHAZINE-DM 6.25-15 MG/5ML PO SYRP Oral Take 2.5 mLs by mouth 4 (four) times daily as needed for cough. 118 mL 0    BP 144/98  Pulse 67  Temp 98.4 F (36.9 C) (Oral)  Resp 16  Ht 5' (1.524 m)  Wt 165 lb (74.844 kg)  BMI 32.22 kg/m2  SpO2 99%  Physical Exam  Nursing note and vitals reviewed. Constitutional: She appears well-developed and well-nourished. No distress.  HENT:  Head: Normocephalic and atraumatic.  Eyes: Pupils are equal, round, and reactive to light.  Neck: Normal range of motion. Neck supple.  Cardiovascular: Normal rate and regular rhythm.   Pulmonary/Chest: Effort normal. She has wheezes (minimal wheezing and coughing noted on exam).  Abdominal: Soft.  Neurological: She is alert.  Skin: Skin is warm and dry.    ED Course  Procedures (including critical care time)  Labs Reviewed - No data to display Dg Chest 2 View  12/25/2011  *RADIOLOGY REPORT*  Clinical Data: Cough and nasal congestion.  CHEST - 2 VIEW  Comparison: 08/30/2011  Findings: Two views of the chest demonstrate clear lungs. Densities along the anterior first ribs are thought to represent overlying structures. Heart and mediastinum are within normal limits.  The trachea is midline.  No evidence for edema or pleural effusions.  IMPRESSION: No acute chest findings.  Original Report Authenticated By: Markus Daft, M.D.     1. Bronchitis       MDM  Patient symptoms consistent with bronchitis. Will treat with azithromycin, give albuterol inhaler and Promethzine with codeine syrup for night time coughing. Concern for PE is very low with patient have nasal congestion and wheezing with no SOB and CP.  Pt to follow-up with PCP or return to the ER prn. 1 day off from work note given.  Pt has been advised of the symptoms that warrant their return to the ED. Patient has voiced understanding and has agreed to follow-up with the PCP or  specialist.         Linus Mako, [place] 12/26/11 1452

## 2011-12-25 NOTE — ED Notes (Signed)
Pt. Stated, I've had a cough and chest congestion for 3 weeks.  I'm just coughing and coughing

## 2011-12-25 NOTE — Discharge Instructions (Signed)

## 2011-12-27 NOTE — ED Provider Notes (Signed)
Medical screening examination/treatment/procedure(s) were performed by non-physician practitioner and as supervising physician I was immediately available for consultation/collaboration.    Dot Lanes, MD 12/27/11 1550

## 2012-10-02 ENCOUNTER — Emergency Department (HOSPITAL_COMMUNITY): Payer: Self-pay

## 2012-10-02 ENCOUNTER — Encounter (HOSPITAL_COMMUNITY): Payer: Self-pay | Admitting: *Deleted

## 2012-10-02 ENCOUNTER — Observation Stay (HOSPITAL_COMMUNITY)
Admission: EM | Admit: 2012-10-02 | Discharge: 2012-10-05 | Disposition: A | Discharge status: Home / Self Care | Payer: Self-pay | Attending: Internal Medicine | Admitting: Internal Medicine

## 2012-10-02 DIAGNOSIS — R05 Cough: Secondary | ICD-10-CM

## 2012-10-02 DIAGNOSIS — Z8249 Family history of ischemic heart disease and other diseases of the circulatory system: Secondary | ICD-10-CM | POA: Insufficient documentation

## 2012-10-02 DIAGNOSIS — R42 Dizziness and giddiness: Secondary | ICD-10-CM | POA: Insufficient documentation

## 2012-10-02 DIAGNOSIS — Z794 Long term (current) use of insulin: Secondary | ICD-10-CM | POA: Insufficient documentation

## 2012-10-02 DIAGNOSIS — R5381 Other malaise: Secondary | ICD-10-CM | POA: Insufficient documentation

## 2012-10-02 DIAGNOSIS — R0602 Shortness of breath: Secondary | ICD-10-CM | POA: Insufficient documentation

## 2012-10-02 DIAGNOSIS — R059 Cough, unspecified: Secondary | ICD-10-CM | POA: Insufficient documentation

## 2012-10-02 DIAGNOSIS — R079 Chest pain, unspecified: Principal | ICD-10-CM | POA: Insufficient documentation

## 2012-10-02 DIAGNOSIS — N390 Urinary tract infection, site not specified: Secondary | ICD-10-CM | POA: Insufficient documentation

## 2012-10-02 DIAGNOSIS — Z7982 Long term (current) use of aspirin: Secondary | ICD-10-CM | POA: Insufficient documentation

## 2012-10-02 DIAGNOSIS — J90 Pleural effusion, not elsewhere classified: Secondary | ICD-10-CM

## 2012-10-02 DIAGNOSIS — IMO0002 Reserved for concepts with insufficient information to code with codable children: Secondary | ICD-10-CM | POA: Diagnosis present

## 2012-10-02 DIAGNOSIS — IMO0001 Reserved for inherently not codable concepts without codable children: Secondary | ICD-10-CM | POA: Insufficient documentation

## 2012-10-02 DIAGNOSIS — E1165 Type 2 diabetes mellitus with hyperglycemia: Secondary | ICD-10-CM | POA: Diagnosis present

## 2012-10-02 DIAGNOSIS — I080 Rheumatic disorders of both mitral and aortic valves: Secondary | ICD-10-CM | POA: Diagnosis present

## 2012-10-02 DIAGNOSIS — R110 Nausea: Secondary | ICD-10-CM | POA: Insufficient documentation

## 2012-10-02 LAB — CREATININE, SERUM
Creatinine, Ser: 0.67 mg/dL (ref 0.50–1.10)
GFR calc Af Amer: >90 mL/min (ref >90)
GFR calc non Af Amer: >90 mL/min (ref >90)

## 2012-10-02 LAB — CBC
HCT: 39.8 % (ref 36.0–46.0)
HCT: 37 % (ref 36.0–46.0)
Hemoglobin: 13.8 g/dL (ref 12.0–15.0)
Hemoglobin: 12.7 g/dL (ref 12.0–15.0)
MCH: 28.2 pg (ref 26.0–34.0)
MCH: 27.7 pg (ref 26.0–34.0)
MCHC: 34.7 g/dL (ref 30.0–36.0)
MCHC: 34.3 g/dL (ref 30.0–36.0)
MCV: 81.4 fL (ref 78.0–100.0)
MCV: 80.8 fL (ref 78.0–100.0)
Platelets: 193 10*3/uL (ref 150–400)
Platelets: 176 10*3/uL (ref 150–400)
RBC: 4.89 MIL/uL (ref 3.87–5.11)
RBC: 4.58 MIL/uL (ref 3.87–5.11)
RDW: 13 % (ref 11.5–15.5)
RDW: 13.2 % (ref 11.5–15.5)
WBC: 7.1 10*3/uL (ref 4.0–10.5)
WBC: 6.2 10*3/uL (ref 4.0–10.5)

## 2012-10-02 LAB — BASIC METABOLIC PANEL
BUN: 9 mg/dL (ref 6–23)
CO2: 23 mEq/L (ref 19–32)
Calcium: 8.9 mg/dL (ref 8.4–10.5)
Chloride: 91 mEq/L — ABNORMAL LOW (ref 96–112)
Creatinine, Ser: 0.8 mg/dL (ref 0.50–1.10)
GFR calc Af Amer: >90 mL/min (ref >90)
GFR calc non Af Amer: 87 mL/min — ABNORMAL LOW (ref >90)
Glucose, Bld: 546 mg/dL — ABNORMAL HIGH (ref 70–99)
Potassium: 4.2 mEq/L (ref 3.5–5.1)
Sodium: 129 mEq/L — ABNORMAL LOW (ref 135–145)

## 2012-10-02 LAB — POCT I-STAT TROPONIN I
Troponin i, poc: 0 ng/mL (ref 0.00–0.08)
Troponin i, poc: 0.01 ng/mL (ref 0.00–0.08)

## 2012-10-02 LAB — GLUCOSE, CAPILLARY
Glucose-Capillary: 288 mg/dL — ABNORMAL HIGH (ref 70–99)
Glucose-Capillary: 204 mg/dL — ABNORMAL HIGH (ref 70–99)

## 2012-10-02 MED ORDER — ACETAMINOPHEN 650 MG RE SUPP: 650.0000 mg | Freq: Four times a day (QID) | RECTAL | Status: DC | PRN

## 2012-10-02 MED ORDER — INSULIN ASPART 100 UNIT/ML ~~LOC~~ SOLN
0.0000 [IU] | Freq: Every day | SUBCUTANEOUS | Status: DC
  Administered 2012-10-02: 2 [IU] via SUBCUTANEOUS
  Administered 2012-10-03: 3 [IU] via SUBCUTANEOUS
  Administered 2012-10-04: 2 [IU] via SUBCUTANEOUS

## 2012-10-02 MED ORDER — MORPHINE SULFATE 4 MG/ML IJ SOLN
4.0000 mg | Freq: Once | INTRAMUSCULAR | Status: AC
  Administered 2012-10-02: 4 mg via INTRAVENOUS
  Filled 2012-10-02: qty 1

## 2012-10-02 MED ORDER — ONDANSETRON HCL 4 MG/2ML IJ SOLN
4.0000 mg | Freq: Four times a day (QID) | INTRAMUSCULAR | Status: DC | PRN
  Administered 2012-10-02 – 2012-10-04 (×3): 4 mg via INTRAVENOUS
  Filled 2012-10-02 (×2): qty 2

## 2012-10-02 MED ORDER — HYDROCODONE-ACETAMINOPHEN 5-325 MG PO TABS
1.0000 | ORAL_TABLET | ORAL | Status: DC | PRN
  Administered 2012-10-03: 1 via ORAL
  Administered 2012-10-03: 2 via ORAL
  Administered 2012-10-03: 1 via ORAL
  Filled 2012-10-02 (×2): qty 1
  Filled 2012-10-02: qty 2

## 2012-10-02 MED ORDER — ASPIRIN EC 81 MG PO TBEC
81.0000 mg | DELAYED_RELEASE_TABLET | Freq: Every morning | ORAL | Status: DC
Start: 2012-10-03 — End: 2012-10-05
  Administered 2012-10-03 – 2012-10-05 (×2): 81 mg via ORAL
  Filled 2012-10-02 (×4): qty 1

## 2012-10-02 MED ORDER — DOCUSATE SODIUM 100 MG PO CAPS
100.0000 mg | ORAL_CAPSULE | Freq: Two times a day (BID) | ORAL | Status: DC
  Administered 2012-10-02 – 2012-10-05 (×5): 100 mg via ORAL
  Filled 2012-10-02 (×7): qty 1

## 2012-10-02 MED ORDER — GI COCKTAIL ~~LOC~~
30.0000 mL | Freq: Once | ORAL | Status: AC
  Administered 2012-10-02: 30 mL via ORAL
  Filled 2012-10-02: qty 30

## 2012-10-02 MED ORDER — SODIUM CHLORIDE 0.9 % IV BOLUS (SEPSIS)
1000.0000 mL | Freq: Once | INTRAVENOUS | Status: AC
  Administered 2012-10-02: 1000 mL via INTRAVENOUS

## 2012-10-02 MED ORDER — ACETAMINOPHEN 325 MG PO TABS
650.0000 mg | ORAL_TABLET | Freq: Four times a day (QID) | ORAL | Status: DC | PRN
Start: 2012-10-02 — End: 2012-10-05
  Administered 2012-10-04: 650 mg via ORAL
  Filled 2012-10-02: qty 2

## 2012-10-02 MED ORDER — SODIUM CHLORIDE 0.9 % IJ SOLN
3.0000 mL | Freq: Two times a day (BID) | INTRAMUSCULAR | Status: DC
  Administered 2012-10-02: 3 mL via INTRAVENOUS
  Administered 2012-10-03: 22:00:00 via INTRAVENOUS
  Administered 2012-10-03 – 2012-10-05 (×4): 3 mL via INTRAVENOUS

## 2012-10-02 MED ORDER — ONDANSETRON HCL 4 MG PO TABS: 4.0000 mg | ORAL_TABLET | Freq: Four times a day (QID) | ORAL | Status: DC | PRN

## 2012-10-02 MED ORDER — INSULIN GLARGINE 100 UNIT/ML ~~LOC~~ SOLN
25.0000 [IU] | Freq: Every day | SUBCUTANEOUS | Status: DC
Start: 2012-10-02 — End: 2012-10-03
  Administered 2012-10-02: 25 [IU] via SUBCUTANEOUS
  Filled 2012-10-02: qty 0.25

## 2012-10-02 MED ORDER — ONDANSETRON HCL 4 MG/2ML IJ SOLN
4.0000 mg | Freq: Once | INTRAMUSCULAR | Status: DC
  Filled 2012-10-02: qty 2

## 2012-10-02 MED ORDER — INSULIN ASPART 100 UNIT/ML ~~LOC~~ SOLN
0.0000 [IU] | Freq: Three times a day (TID) | SUBCUTANEOUS | Status: DC
  Administered 2012-10-03 – 2012-10-04 (×4): 5 [IU] via SUBCUTANEOUS
  Administered 2012-10-04: 8 [IU] via SUBCUTANEOUS
  Administered 2012-10-04: 3 [IU] via SUBCUTANEOUS
  Administered 2012-10-05: 8 [IU] via SUBCUTANEOUS
  Administered 2012-10-05: 5 [IU] via SUBCUTANEOUS

## 2012-10-02 MED ORDER — INSULIN REGULAR HUMAN 100 UNIT/ML IJ SOLN: 10.0000 [IU] | Freq: Once | INTRAMUSCULAR | Status: DC

## 2012-10-02 MED ORDER — INSULIN ASPART 100 UNIT/ML ~~LOC~~ SOLN
10.0000 [IU] | Freq: Once | SUBCUTANEOUS | Status: AC
  Administered 2012-10-02: 10 [IU] via SUBCUTANEOUS
  Filled 2012-10-02: qty 10

## 2012-10-02 MED ORDER — ENOXAPARIN SODIUM 40 MG/0.4ML ~~LOC~~ SOLN
40.0000 mg | SUBCUTANEOUS | Status: DC
  Administered 2012-10-02 – 2012-10-04 (×3): 40 mg via SUBCUTANEOUS
  Filled 2012-10-02 (×4): qty 0.4

## 2012-10-02 MED ORDER — SODIUM CHLORIDE 0.9 % IV SOLN
INTRAVENOUS | Status: AC
  Administered 2012-10-02: 23:00:00 via INTRAVENOUS

## 2012-10-02 MED ORDER — ASPIRIN 81 MG PO CHEW
162.0000 mg | CHEWABLE_TABLET | Freq: Once | ORAL | Status: AC
  Administered 2012-10-02: 162 mg via ORAL
  Filled 2012-10-02: qty 2

## 2012-10-02 MED ORDER — NITROGLYCERIN 0.4 MG SL SUBL
0.4000 mg | SUBLINGUAL_TABLET | SUBLINGUAL | Status: DC | PRN
  Administered 2012-10-02 (×3): 0.4 mg via SUBLINGUAL
  Filled 2012-10-02: qty 25

## 2012-10-02 MED ORDER — MORPHINE SULFATE 2 MG/ML IJ SOLN: 2.0000 mg | INTRAMUSCULAR | Status: DC | PRN

## 2012-10-02 MED ORDER — SODIUM CHLORIDE 0.9 % IV SOLN
Freq: Once | INTRAVENOUS | Status: AC
  Administered 2012-10-02: 19:00:00 via INTRAVENOUS

## 2012-10-02 NOTE — ED Provider Notes (Addendum)
History     CSN: [id number]  Arrival date & time 10/02/12  59   First MD Initiated Contact with Patient 10/02/12 1507      Chief Complaint  Patient presents with  . Chest Pain    (Consider location/radiation/quality/duration/timing/severity/associated sxs/prior treatment) HPI Comments: Pt comes in with cc of chest pain. PMHX of diabetes, and family hx of CAD (faither - age 65s). Pt reports that around 1 pm, while she was driving her car, she had sudden onset, sharp, pressure like chest pain that was constant and  Severe. She has no experience of similar pain in the past. Pt had some nausea, no diaphoresis, dib. The pain moves laterally, but no radiation to the jaw or shoulder. She has no hx of CAD, last stress reportedly was 2+ years ago. Pt had some substernal chest pain on Saturday, with nausea, but didn't think much of it. No hx of PE, DVT.  Patient is a 47 y.o. female presenting with chest pain. The history is provided by the patient.  Chest Pain Associated symptoms: nausea   Associated symptoms: no abdominal pain, no cough, no dizziness, no shortness of breath and not vomiting     Past Medical History  Diagnosis Date  . Diabetes mellitus     Past Surgical History  Procedure Laterality Date  . Abdominal hysterectomy    . Tubal ligation    . Tonsillectomy    . Cesarean section    . Ganglion cyst excision      No family history on file.  History  Substance Use Topics  . Smoking status: Never Smoker   . Smokeless tobacco: Not on file  . Alcohol Use: No    OB History   Grav Para Term Preterm Abortions TAB SAB Ect Mult Living                  Review of Systems  Constitutional: Negative for activity change.  HENT: Negative for facial swelling and neck pain.   Respiratory: Negative for cough, shortness of breath and wheezing.   Cardiovascular: Positive for chest pain.  Gastrointestinal: Positive for nausea. Negative for vomiting, abdominal pain, diarrhea,  constipation, blood in stool and abdominal distention.  Genitourinary: Negative for hematuria and difficulty urinating.  Skin: Negative for color change.  Neurological: Negative for dizziness.  Hematological: Does not bruise/bleed easily.  Psychiatric/Behavioral: Negative for confusion.    Allergies  Review of patient's allergies indicates no known allergies.  Home Medications   Current Outpatient Rx  Name  Route  Sig  Dispense  Refill  . aspirin EC 81 MG tablet   Oral   Take 81 mg by mouth every morning.         . insulin aspart (NOVOLOG) 100 UNIT/ML injection   Subcutaneous   Inject 15-30 Units into the skin 3 (three) times daily before meals. Per sliding scale         . insulin glargine (LANTUS) 100 UNIT/ML injection   Subcutaneous   Inject 20 Units into the skin at bedtime.           BP 124/76  Pulse 96  Temp(Src) 98.2 F (36.8 C) (Oral)  Resp 17  SpO2 100%  Physical Exam  Nursing note and vitals reviewed. Constitutional: She is oriented to person, place, and time. She appears well-developed and well-nourished.  HENT:  Head: Normocephalic and atraumatic.  Eyes: EOM are normal. Pupils are equal, round, and reactive to light.  Neck: Neck supple.  Cardiovascular: Normal rate,  regular rhythm and normal heart sounds.   No murmur heard. Pulmonary/Chest: Effort normal. No respiratory distress.  Abdominal: Soft. She exhibits no distension. There is no tenderness. There is no rebound and no guarding.  Musculoskeletal: She exhibits no edema.  Neurological: She is alert and oriented to person, place, and time.  Skin: Skin is warm and dry.    ED Course  Procedures (including critical care time)  Labs Reviewed  BASIC METABOLIC PANEL - Abnormal; Notable for the following:    Sodium 129 (*)    Chloride 91 (*)    Glucose, Bld 546 (*)    GFR calc non Af Amer 87 (*)    All other components within normal limits  CBC  POCT I-STAT TROPONIN I   Dg Chest Port 1  View  10/02/2012  *RADIOLOGY REPORT*  Clinical Data: Chest pain  PORTABLE CHEST - 1 VIEW  Comparison: December 25, 2011.  Findings: Cardiomediastinal silhouette appears normal.  No acute pulmonary disease is noted.  Bony thorax is intact.  IMPRESSION: No acute cardiopulmonary abnormality seen.   Original Report Authenticated By: Marijo Conception.,  M.D.      No diagnosis found.    MDM   Date: 10/02/2012  Rate: 84  Rhythm: normal sinus rhythm  QRS Axis: normal  Intervals: normal  ST/T Wave abnormalities: normal  Conduction Disutrbances: none  Narrative Interpretation: unremarkable  Pt comes in with cc of chest pain. Left sided pain with some nausea. Pt has diabetes, and has premature CAD in the family. There are no other typical features, but given that patient is a woman and diabetic, she is at risk of presenting with atypical sx. Will admit for ACS r/o -pt has no PCP.   Varney Biles, MD 10/02/12 1701  7:18 PM Pt still has some pain. Dont think it is dissection. The chest pain is not radiating to the back, is waxing and waning, and patient troponin, Cr are normal, she has no neuro complains. Will request admission.  Varney Biles, MD 10/02/12 1919

## 2012-10-02 NOTE — Progress Notes (Signed)
Central telemetry called stating pt's telemetry was not coming through, showing no data on monitor. Tele placement rearranged on pt's chest and batteries changed, still showing no data per [employer]. In process of changing tele box. Will continue to monitor pt. - Carmon Ginsberg, RN

## 2012-10-02 NOTE — Progress Notes (Signed)
Pt confirms pcp is [facility] including Dr Shanon Brow talbot  EPIC updated

## 2012-10-02 NOTE — H&P (Signed)
PCP:  [facility]    Chief Complaint:  Chest pain  HPI: [name] [name] is a 47 y.o. female   has a past medical history of Diabetes mellitus.   Presented with  At 2 pm patient was driving and started to have chest pain it felt like a pinch the pain would only last few seconds but became progressively worse it was coming and going. Currently she is chest pain free. The pain did not improve with nitro but got better with GI cocktail and morphine. She has had nausea for past 2-3 days she denies any vomiting. Today she felt some shortness of breath but this has resolved. She went to the beach last week 3 hour drive. Denies any leg swelling. Pain is non pleuritic.  She reports taking her lantus but her blood sugar has been elevated in 200-300's. Patient endorses burning sensation in her legs bilaterally but sometimes worse in the left.  Review of Systems:    Pertinent positives include: chest pain, nausea, shortness of breath at rest.   Constitutional:  No weight loss, night sweats, Fevers, chills, fatigue, weight loss  HEENT:  No headaches, Difficulty swallowing,Tooth/dental problems,Sore throat,  No sneezing, itching, ear ache, nasal congestion, post nasal drip,  Cardio-vascular:  No Orthopnea, PND, anasarca, dizziness, palpitations.no Bilateral lower extremity swelling  GI:  No heartburn, indigestion, abdominal pain,  vomiting, diarrhea, change in bowel habits, loss of appetite, melena, blood in stool, hematemesis Resp:   No dyspnea on exertion, No excess mucus, no productive cough, No non-productive cough, No coughing up of blood.No change in color of mucus.No wheezing. Skin:  no rash or lesions. No jaundice GU:  no dysuria, change in color of urine, no urgency or frequency. No straining to urinate.  No flank pain.  Musculoskeletal:  No joint pain or no joint swelling. No decreased range of motion. No back pain.  Psych:  No change in mood or affect. No depression  or anxiety. No memory loss.  Neuro: no localizing neurological complaints, no tingling, no weakness, no double vision, no gait abnormality, no slurred speech, no confusion  Otherwise ROS are negative except for above, 10 systems were reviewed  Past Medical History: Past Medical History  Diagnosis Date  . Diabetes mellitus    Past Surgical History  Procedure Laterality Date  . Abdominal hysterectomy    . Tubal ligation    . Tonsillectomy    . Cesarean section    . Ganglion cyst excision       Medications: Prior to Admission medications   Medication Sig Start Date End Date Taking? Authorizing Provider  aspirin EC 81 MG tablet Take 81 mg by mouth every morning.   Yes Historical Provider, MD  insulin aspart (NOVOLOG) 100 UNIT/ML injection Inject 15-30 Units into the skin 3 (three) times daily before meals. Per sliding scale   Yes Historical Provider, MD  insulin glargine (LANTUS) 100 UNIT/ML injection Inject 20 Units into the skin at bedtime.   Yes Historical Provider, MD    Allergies:  No Known Allergies  Social History:  Ambulatory   independently   Lives at  Home with family   reports that she has never smoked. She has never used smokeless tobacco. She reports that she does not drink alcohol or use illicit drugs.   Family History: family history includes Diabetes in her father and Heart attack in her father.    Physical Exam: Patient Vitals for the past 24 hrs:  BP Temp  Temp src Pulse Resp SpO2  10/02/12 1900 135/85 mmHg - - 87 16 97 %  10/02/12 1857 128/79 mmHg - - 89 12 97 %  10/02/12 1836 126/83 mmHg - - 76 - 100 %  10/02/12 1800 128/77 mmHg - - 70 18 99 %  10/02/12 1606 124/76 mmHg - - 96 17 100 %  10/02/12 1601 148/88 mmHg - - 83 20 99 %  10/02/12 1438 183/88 mmHg 98.2 F (36.8 C) Oral 83 10 100 %    1. General:  in No Acute distress 2. Psychological: Alert and Oriented 3. Head/ENT:   Moist  Mucous Membranes                          Head Non traumatic,  neck supple                          Normal  Dentition 4. SKIN: normal   Skin turgor,  Skin clean Dry and intact no rash 5. Heart: Regular rate and rhythm no Murmur, Rub or gallop 6. Lungs: Clear to auscultation bilaterally, no wheezes or crackles   7. Abdomen: Soft, non-tender, Non distended 8. Lower extremities: no clubbing, cyanosis, or edema 9. Neurologically Grossly intact, moving all 4 extremities equally 10. MSK: Normal range of motion, pain non-reproducible by palpation  body mass index is unknown because there is no weight on file.   Labs on Admission:   Recent Labs  10/02/12 1440  NA 129*  K 4.2  CL 91*  CO2 23  GLUCOSE 546*  BUN 9  CREATININE 0.80  CALCIUM 8.9   No results found for this basename: AST, ALT, ALKPHOS, BILITOT, PROT, ALBUMIN,  in the last 72 hours No results found for this basename: LIPASE, AMYLASE,  in the last 72 hours  Recent Labs  10/02/12 1440  WBC 7.1  HGB 13.8  HCT 39.8  MCV 81.4  PLT 193   No results found for this basename: CKTOTAL, CKMB, CKMBINDEX, TROPONINI,  in the last 72 hours No results found for this basename: TSH, T4TOTAL, FREET3, T3FREE, THYROIDAB,  in the last 72 hours No results found for this basename: VITAMINB12, FOLATE, FERRITIN, TIBC, IRON, RETICCTPCT,  in the last 72 hours Lab Results  Component Value Date   HGBA1C  Value: 13.8 (NOTE)                                                                       According to the ADA Clinical Practice Recommendations for 2011, when HbA1c is used as a screening test:   >=6.5%   Diagnostic of Diabetes Mellitus           (if abnormal result  is confirmed)  5.7-6.4%   Increased risk of developing Diabetes Mellitus  References:Diagnosis and Classification of Diabetes Mellitus,Diabetes Care,2011,34(Suppl 1):S62-S69 and Standards of Medical Care in         Diabetes - 2011,Diabetes Care,2011,34  (Suppl 1):S11-S61.* 11/06/2009    The CrCl is unknown because both a height and weight (above a  minimum accepted value) are required for this calculation. ABG    Component Value Date/Time   PHART 7.429* 11/05/2009 1457  HCO3 24.9* 11/05/2009 1502   TCO2 26 11/05/2009 1502   O2SAT 64.0 11/05/2009 1502   Other results:  I have pearsonaly reviewed this: ECG REPORT  Rate: 84  Rhythm: NSR ST&T Change: no evince of ischmia   Cultures:    Component Value Date/Time   SDES URINE, CLEAN CATCH 11/06/2009 1153   SPECREQUEST ADDED 11/07/09 0506 11/06/2009 1153   CULT NO GROWTH 11/06/2009 1153   REPTSTATUS 11/08/2009 FINAL 11/06/2009 1153       Radiological Exams on Admission: Dg Chest Port 1 View  10/02/2012  *RADIOLOGY REPORT*  Clinical Data: Chest pain  PORTABLE CHEST - 1 VIEW  Comparison: December 25, 2011.  Findings: Cardiomediastinal silhouette appears normal.  No acute pulmonary disease is noted.  Bony thorax is intact.  IMPRESSION: No acute cardiopulmonary abnormality seen.   Original Report Authenticated By: Marijo Conception.,  M.D.     Chart has been reviewed  Assessment/Plan  47 year old female with history of uncontrolled diabetes and mitral valve disease presents with atypical chest pain  Present on Admission:  . Chest pain at rest - given risk factors will admit, monitor on telemetry, cycle cardiac enzymes, obtain serial ECG. Further risk stratify with lipid panel, hgA1C, obtain TSH. Make sure patient is on Aspirin. Further treatment based on the currently pending results. Will also obtain d-dimer given recent trip but her history is not consistent with pulmonary embolism.  Marland Kitchen MITRAL REGURGITATION - will obtain 2-D echo to quantify this further  . DM (diabetes mellitus), type 2, uncontrolled - home dose of Lantus to 25 units and monitored on sliding scale will write for diabetes education. The fact that she is developing a lateral wall extremity burning sensation suggestive of diabetic neuropathy. Her diabetes seems to be under better control. Last hemoglobin A1c was done in 2011and was 13.     Prophylaxis:  Lovenox, Protonix  CODE STATUS: FULL CODE  Other plan as per orders.  I have spent a total of 55 min on this admission  Danashia Landers 10/02/2012, 8:09 PM

## 2012-10-02 NOTE — ED Notes (Signed)
Pt reports sudden onset of left sided chest pain, described as "aching". Reports same pain happened 2 days ago, did radiate to left arm - did not seek medical treatment, symptoms went away on own. Pt nauseated, c/o SOB, dizziness, lightheadedness, and weakness. Hx of DM, family hx of MI

## 2012-10-02 NOTE — ED Notes (Signed)
[id number] Expected date:<BR> Expected time:<BR> Means of arrival:<BR> Comments:<BR> Hold for B

## 2012-10-03 ENCOUNTER — Other Ambulatory Visit: Payer: Self-pay

## 2012-10-03 DIAGNOSIS — J90 Pleural effusion, not elsewhere classified: Secondary | ICD-10-CM

## 2012-10-03 LAB — COMPREHENSIVE METABOLIC PANEL
ALT: 11 U/L (ref 0–35)
AST: 12 U/L (ref 0–37)
Albumin: 2.5 g/dL — ABNORMAL LOW (ref 3.5–5.2)
Alkaline Phosphatase: 96 U/L (ref 39–117)
BUN: 13 mg/dL (ref 6–23)
CO2: 26 mEq/L (ref 19–32)
Calcium: 7.8 mg/dL — ABNORMAL LOW (ref 8.4–10.5)
Chloride: 102 mEq/L (ref 96–112)
Creatinine, Ser: 0.71 mg/dL (ref 0.50–1.10)
GFR calc Af Amer: >90 mL/min (ref >90)
GFR calc non Af Amer: >90 mL/min (ref >90)
Glucose, Bld: 297 mg/dL — ABNORMAL HIGH (ref 70–99)
Potassium: 3.5 mEq/L (ref 3.5–5.1)
Sodium: 135 mEq/L (ref 135–145)
Total Bilirubin: 0.2 mg/dL — ABNORMAL LOW (ref 0.3–1.2)
Total Protein: 5.5 g/dL — ABNORMAL LOW (ref 6.0–8.3)

## 2012-10-03 LAB — TROPONIN I
Troponin I: <0.3 ng/mL (ref <0.30)
Troponin I: <0.3 ng/mL (ref <0.30)
Troponin I: <0.3 ng/mL (ref <0.30)

## 2012-10-03 LAB — URINALYSIS, ROUTINE W REFLEX MICROSCOPIC
Bilirubin Urine: NEGATIVE
Glucose, UA: >1000 mg/dL — AB
Ketones, ur: NEGATIVE mg/dL
Nitrite: POSITIVE — AB
Protein, ur: 30 mg/dL — AB
Specific Gravity, Urine: 1.039 — ABNORMAL HIGH (ref 1.005–1.030)
Urobilinogen, UA: 0.2 mg/dL (ref 0.0–1.0)
pH: 5.5 (ref 5.0–8.0)

## 2012-10-03 LAB — GLUCOSE, CAPILLARY
Glucose-Capillary: 237 mg/dL — ABNORMAL HIGH (ref 70–99)
Glucose-Capillary: 214 mg/dL — ABNORMAL HIGH (ref 70–99)
Glucose-Capillary: 285 mg/dL — ABNORMAL HIGH (ref 70–99)
Glucose-Capillary: 274 mg/dL — ABNORMAL HIGH (ref 70–99)

## 2012-10-03 LAB — CBC
HCT: 34.4 % — ABNORMAL LOW (ref 36.0–46.0)
Hemoglobin: 11.9 g/dL — ABNORMAL LOW (ref 12.0–15.0)
MCH: 28 pg (ref 26.0–34.0)
MCHC: 34.6 g/dL (ref 30.0–36.0)
MCV: 80.9 fL (ref 78.0–100.0)
Platelets: 157 10*3/uL (ref 150–400)
RBC: 4.25 MIL/uL (ref 3.87–5.11)
RDW: 13.2 % (ref 11.5–15.5)
WBC: 5.9 10*3/uL (ref 4.0–10.5)

## 2012-10-03 LAB — URINE MICROSCOPIC-ADD ON

## 2012-10-03 LAB — D-DIMER, QUANTITATIVE: D-Dimer, Quant: 0.28 ug/mL-FEU (ref 0.00–0.48)

## 2012-10-03 LAB — MAGNESIUM: Magnesium: 2 mg/dL (ref 1.5–2.5)

## 2012-10-03 LAB — TSH: TSH: 1.239 u[IU]/mL (ref 0.350–4.500)

## 2012-10-03 LAB — HEMOGLOBIN A1C
Hgb A1c MFr Bld: 16.8 % — ABNORMAL HIGH (ref <5.7)
Mean Plasma Glucose: 435 mg/dL — ABNORMAL HIGH (ref <117)

## 2012-10-03 LAB — PHOSPHORUS: Phosphorus: 3.7 mg/dL (ref 2.3–4.6)

## 2012-10-03 MED ORDER — INSULIN GLARGINE 100 UNIT/ML ~~LOC~~ SOLN
15.0000 [IU] | Freq: Once | SUBCUTANEOUS | Status: AC
  Administered 2012-10-03: 15 [IU] via SUBCUTANEOUS
  Filled 2012-10-03: qty 0.15

## 2012-10-03 MED ORDER — INSULIN GLARGINE 100 UNIT/ML ~~LOC~~ SOLN
30.0000 [IU] | Freq: Every day | SUBCUTANEOUS | Status: DC
  Administered 2012-10-04: 30 [IU] via SUBCUTANEOUS
  Filled 2012-10-03 (×2): qty 0.3

## 2012-10-03 MED ORDER — INSULIN REGULAR HUMAN 100 UNIT/ML IJ SOLN: 3.0000 [IU] | Freq: Three times a day (TID) | INTRAMUSCULAR | Status: DC

## 2012-10-03 MED ORDER — INSULIN ASPART 100 UNIT/ML ~~LOC~~ SOLN
3.0000 [IU] | Freq: Three times a day (TID) | SUBCUTANEOUS | Status: DC
  Administered 2012-10-03 – 2012-10-05 (×5): 3 [IU] via SUBCUTANEOUS

## 2012-10-03 MED ORDER — INSULIN GLARGINE 100 UNIT/ML ~~LOC~~ SOLN
30.0000 [IU] | Freq: Every day | SUBCUTANEOUS | Status: DC
  Filled 2012-10-03: qty 0.3

## 2012-10-03 MED ORDER — SODIUM CHLORIDE 0.9 % IV SOLN
INTRAVENOUS | Status: DC
  Administered 2012-10-03 – 2012-10-04 (×2): via INTRAVENOUS

## 2012-10-03 NOTE — Progress Notes (Signed)
  Echocardiogram 2D Echocardiogram has been performed.  [name] [name] 10/03/2012, 8:57 AM

## 2012-10-03 NOTE — Progress Notes (Signed)
[facility]                                                                                Patient Demographics  [name] [name], is a 47 y.o. female, DOB - [birth year], [id number], [id number]  Admit date - 10/02/2012  Admitting Physician Toy Baker, MD  Outpatient Primary MD for the patient is TALBOT, DAVID [name] Grayer, MD  LOS - 1   Chief Complaint  Patient presents with  . Chest Pain        Assessment & Plan    . Chest pain at rest - currently pain free, stable on telemetry, -ve cardiac enzymes, she has uncontrolled diabetes mellitus, d-dimer unremarkable, chest x-ray stable, nonacute EKG, echo pending, requested cardiology to evaluate the patient for possible noninvasive stress test.     . MITRAL REGURGITATION - will obtain 2-D echo to quantify this further     . DM (diabetes mellitus), type 2, uncontrolled - extremely elevated A1c, we'll increase Lantus to 30 units, will add pre-meal NovoLog and continue sliding scale insulin and monitor sugars closely.   Lab Results  Component Value Date   HGBA1C 16.8* 10/02/2012   CBG (last 3)   Recent Labs  10/02/12 1909 10/02/12 2315 10/03/12 0732  GLUCAP 288* 204* 63*      Code Status: full  Family Communication: patient  Disposition Plan: home   Procedures CXR, Echo   Consults  Cards   DVT Prophylaxis  Lovenox   Lab Results  Component Value Date   PLT 157 10/03/2012    Medications  Scheduled Meds: . aspirin EC  81 mg Oral q morning - 10a  . docusate sodium  100 mg Oral BID  . enoxaparin (LOVENOX) injection  40 mg Subcutaneous Q24H  . insulin aspart  0-15 Units Subcutaneous TID WC  . insulin aspart  0-5 Units Subcutaneous QHS  . insulin glargine  25 Units Subcutaneous QHS  . ondansetron (ZOFRAN) IV  4 mg Intravenous Once  . sodium chloride  3 mL Intravenous Q12H   Continuous Infusions: . sodium chloride 10 mL/hr at 10/03/12 0838   PRN Meds:.acetaminophen, acetaminophen,  HYDROcodone-acetaminophen, morphine injection, nitroGLYCERIN, ondansetron (ZOFRAN) IV, ondansetron  Antibiotics     Anti-infectives   None       Time Spent in minutes  30   Lala Lund K M.D on 10/03/2012 at 10:32 AM  Between 7am to 7pm - Pager - [phone]  After 7pm go to [email or web address] - password [email or web address]  And look for the night coverage person covering for me after hours  [facility] Office  [phone]    Subjective:   Estephani Mewborn today has, No headache, No chest pain, No abdominal pain - No Nausea, No new weakness tingling or numbness, No Cough - SOB.   Objective:   Filed Vitals:   10/02/12 2000 10/02/12 2200 10/03/12 0507 10/03/12 0900  BP: 110/72 139/84 118/73 131/82  Pulse: 77 63 67 66  Temp:  97.7 F (36.5 C) 98.2 F (36.8 C) 97.8 F (36.6 C)  TempSrc:  Oral Oral Oral  Resp: 16 16 16 18   Height:  5' (1.524 m)    Weight:  74.526 kg (164 lb  4.8 oz)    SpO2: 97% 100% 97% 98%    Wt Readings from Last 3 Encounters:  10/02/12 74.526 kg (164 lb 4.8 oz)  12/25/11 74.844 kg (165 lb)  08/30/11 72.576 kg (160 lb)     Intake/Output Summary (Last 24 hours) at 10/03/12 1032 Last data filed at 10/03/12 0900  Gross per 24 hour  Intake    243 ml  Output    650 ml  Net   -407 ml    Exam Awake Alert, Oriented X 3, No new F.N deficits, Normal affect [place].AT,PERRAL Supple Neck,No JVD, No cervical lymphadenopathy appriciated.  Symmetrical Chest wall movement, Good air movement bilaterally, CTAB RRR,No Gallops,Rubs or new Murmurs, No Parasternal Heave +ve B.Sounds, Abd Soft, Non tender, No organomegaly appriciated, No rebound - guarding or rigidity. No Cyanosis, Clubbing or edema, No new Rash or bruise      Data Review   Micro Results No results found for this or any previous visit (from the past 240 hour(s)).  Radiology Reports Dg Chest Port 1 View  10/02/2012  *RADIOLOGY REPORT*  Clinical Data: Chest pain  PORTABLE CHEST - 1 VIEW  Comparison:  December 25, 2011.  Findings: Cardiomediastinal silhouette appears normal.  No acute pulmonary disease is noted.  Bony thorax is intact.  IMPRESSION: No acute cardiopulmonary abnormality seen.   Original Report Authenticated By: Marijo Conception.,  M.D.     CBC  Recent Labs Lab 10/02/12 1440 10/02/12 2225 10/03/12 0245  WBC 7.1 6.2 5.9  HGB 13.8 12.7 11.9*  HCT 39.8 37.0 34.4*  PLT 193 176 157  MCV 81.4 80.8 80.9  MCH 28.2 27.7 28.0  MCHC 34.7 34.3 34.6  RDW 13.0 13.2 13.2    Chemistries   Recent Labs Lab 10/02/12 1440 10/02/12 2225 10/03/12 0245  NA 129*  --  135  K 4.2  --  3.5  CL 91*  --  102  CO2 23  --  26  GLUCOSE 546*  --  297*  BUN 9  --  13  CREATININE 0.80 0.67 0.71  CALCIUM 8.9  --  7.8*  MG  --   --  2.0  AST  --   --  12  ALT  --   --  11  ALKPHOS  --   --  96  BILITOT  --   --  0.2*   ------------------------------------------------------------------------------------------------------------------ estimated creatinine clearance is 79.2 ml/min (by C-G formula based on Cr of 0.71). ------------------------------------------------------------------------------------------------------------------  Recent Labs  10/02/12 2225  HGBA1C 16.8*   ------------------------------------------------------------------------------------------------------------------ No results found for this basename: CHOL, HDL, LDLCALC, TRIG, CHOLHDL, LDLDIRECT,  in the last 72 hours ------------------------------------------------------------------------------------------------------------------ No results found for this basename: TSH, T4TOTAL, FREET3, T3FREE, THYROIDAB,  in the last 72 hours ------------------------------------------------------------------------------------------------------------------ No results found for this basename: VITAMINB12, FOLATE, FERRITIN, TIBC, IRON, RETICCTPCT,  in the last 72 hours  Coagulation profile No results found for this basename: INR,  PROTIME,  in the last 168 hours   Recent Labs  10/02/12 2225  DDIMER 0.28    Cardiac Enzymes  Recent Labs Lab 10/03/12 0245 10/03/12 0852  TROPONINI <0.30 <0.30   ------------------------------------------------------------------------------------------------------------------ No components found with this basename: POCBNP,

## 2012-10-03 NOTE — Consult Note (Signed)
Reason for Consult: Chest pain  Requesting Physician: Mary Sella  HPI: This is a 47 y.o. 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 She has DM. She is currently unemployed and uninsured and is followed at a clinic in [place]. Yesterday she had Lt sided chest pain that waxed and waned. Onset while driving. It radiated around her Lt breast and would last a few seconds but recur. She felt nauseated but no diaphoresis, radiation, SOB, or pleuritic component. 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 Her symptoms are still present but improved.  PMHx:  Past Medical History  Diagnosis Date  . Diabetes mellitus    Past Surgical History  Procedure Laterality Date  . Abdominal hysterectomy    . Tubal ligation    . Tonsillectomy    . Cesarean section    . Ganglion cyst excision      FAMHx: Family History  Problem Relation Age of Onset  . Diabetes Father   . Heart attack Father     SOCHx:  reports that she has never smoked. She has never used smokeless tobacco. She reports that she does not drink alcohol or use illicit drugs.Unemployed, uninsured.  ALLERGIES: No Known Allergies  ROS: Pertinent items are noted in HPI. No hemoptysis, no fever or cough.  HOME MEDICATIONS: Prescriptions prior to admission  Medication Sig Dispense Refill  . aspirin EC 81 MG tablet Take 81 mg by mouth every morning.      . insulin aspart (NOVOLOG) 100 UNIT/ML injection Inject 15-30 Units into the skin 3 (three) times daily before meals. Per sliding scale      . insulin glargine (LANTUS) 100 UNIT/ML injection Inject 20 Units into the skin at bedtime.        HOSPITAL MEDICATIONS: I have reviewed the patient's current medications.  VITALS: Blood pressure 119/76, pulse 74, temperature 98.6 F (37 C), temperature source Oral, resp. rate 14,  height 5' (1.524 m), weight 74.526 kg (164 lb 4.8 oz), SpO2 99.00%.  PHYSICAL EXAM: General appearance: alert, cooperative, no distress and moderately obese Neck: no carotid bruit and no JVD Lungs: clear to auscultation bilaterally Heart: regular rate and rhythm Abdomen: obese Extremities: no edema Pulses: 2+ and symmetric Skin: Skin color, texture, turgor normal. No rashes or lesions Neurologic: Grossly normal  LABS: Results for orders placed during the hospital encounter of 10/02/12 (from the past 48 hour(s))  CBC     Status: None   Collection Time    10/02/12  2:40 PM      Result Value Range   WBC 7.1  4.0 - 10.5 K/uL   RBC 4.89  3.87 - 5.11 MIL/uL   Hemoglobin 13.8  12.0 - 15.0 g/dL   HCT 39.8  36.0 - 46.0 %   MCV 81.4  78.0 - 100.0 fL   MCH 28.2  26.0 - 34.0 pg   MCHC 34.7  30.0 - 36.0 g/dL   RDW 13.0  11.5 - 15.5 %   Platelets 193  150 - 400 K/uL  BASIC METABOLIC PANEL     Status: Abnormal   Collection Time    10/02/12  2:40 PM      Result Value Range   Sodium 129 (*) 135 - 145 mEq/L   Potassium 4.2  3.5 - 5.1 mEq/L   Chloride 91 (*) 96 -  112 mEq/L   CO2 23  19 - 32 mEq/L   Glucose, Bld 546 (*) 70 - 99 mg/dL   BUN 9  6 - 23 mg/dL   Creatinine, Ser 0.80  0.50 - 1.10 mg/dL   Calcium 8.9  8.4 - 10.5 mg/dL   GFR calc non Af Amer 87 (*) >90 mL/min   GFR calc Af Amer >90  >90 mL/min   Comment:            The eGFR has been calculated     using the CKD EPI equation.     This calculation has not been     validated in all clinical     situations.     eGFR's persistently     <90 mL/min signify     possible Chronic Kidney Disease.  POCT I-STAT TROPONIN I     Status: None   Collection Time    10/02/12  2:49 PM      Result Value Range   Troponin i, poc 0.00  0.00 - 0.08 ng/mL   Comment 3            Comment: Due to the release kinetics of cTnI,     a negative result within the first hours     of the onset of symptoms does not rule out     myocardial infarction with  certainty.     If myocardial infarction is still suspected,     repeat the test at appropriate intervals.  POCT I-STAT TROPONIN I     Status: None   Collection Time    10/02/12  6:52 PM      Result Value Range   Troponin i, poc 0.01  0.00 - 0.08 ng/mL   Comment 3            Comment: Due to the release kinetics of cTnI,     a negative result within the first hours     of the onset of symptoms does not rule out     myocardial infarction with certainty.     If myocardial infarction is still suspected,     repeat the test at appropriate intervals.  GLUCOSE, CAPILLARY     Status: Abnormal   Collection Time    10/02/12  7:09 PM      Result Value Range   Glucose-Capillary 288 (*) 70 - 99 mg/dL  CBC     Status: None   Collection Time    10/02/12 10:25 PM      Result Value Range   WBC 6.2  4.0 - 10.5 K/uL   RBC 4.58  3.87 - 5.11 MIL/uL   Hemoglobin 12.7  12.0 - 15.0 g/dL   HCT 37.0  36.0 - 46.0 %   MCV 80.8  78.0 - 100.0 fL   MCH 27.7  26.0 - 34.0 pg   MCHC 34.3  30.0 - 36.0 g/dL   RDW 13.2  11.5 - 15.5 %   Platelets 176  150 - 400 K/uL  CREATININE, SERUM     Status: None   Collection Time    10/02/12 10:25 PM      Result Value Range   Creatinine, Ser 0.67  0.50 - 1.10 mg/dL   GFR calc non Af Amer >90  >90 mL/min   GFR calc Af Amer >90  >90 mL/min   Comment:            The eGFR has been calculated     using  the CKD EPI equation.     This calculation has not been     validated in all clinical     situations.     eGFR's persistently     <90 mL/min signify     possible Chronic Kidney Disease.  HEMOGLOBIN A1C     Status: Abnormal   Collection Time    10/02/12 10:25 PM      Result Value Range   Hemoglobin A1C 16.8 (*) <5.7 %   Comment: (NOTE)                                                                               According to the ADA Clinical Practice Recommendations for 2011, when     HbA1c is used as a screening test:      >=6.5%   Diagnostic of Diabetes Mellitus                (if abnormal result is confirmed)     5.7-6.4%   Increased risk of developing Diabetes Mellitus     References:Diagnosis and Classification of Diabetes Mellitus,Diabetes     [id number] 1):S62-S69 and Standards of Medical Care in             Diabetes - 2011,Diabetes Care,2011,34 (Suppl 1):S11-S61.   Mean Plasma Glucose 435 (*) <117 mg/dL  D-DIMER, QUANTITATIVE     Status: None   Collection Time    10/02/12 10:25 PM      Result Value Range   D-Dimer, Quant 0.28  0.00 - 0.48 ug/mL-FEU   Comment:            AT THE INHOUSE ESTABLISHED CUTOFF     VALUE OF 0.48 ug/mL FEU,     THIS ASSAY HAS BEEN DOCUMENTED     IN THE LITERATURE TO HAVE     A SENSITIVITY AND NEGATIVE     PREDICTIVE VALUE OF AT LEAST     98 TO 99%.  THE TEST RESULT     SHOULD BE CORRELATED WITH     AN ASSESSMENT OF THE CLINICAL     PROBABILITY OF DVT / VTE.  GLUCOSE, CAPILLARY     Status: Abnormal   Collection Time    10/02/12 11:15 PM      Result Value Range   Glucose-Capillary 204 (*) 70 - 99 mg/dL  MAGNESIUM     Status: None   Collection Time    10/03/12  2:45 AM      Result Value Range   Magnesium 2.0  1.5 - 2.5 mg/dL  PHOSPHORUS     Status: None   Collection Time    10/03/12  2:45 AM      Result Value Range   Phosphorus 3.7  2.3 - 4.6 mg/dL  TSH     Status: None   Collection Time    10/03/12  2:45 AM      Result Value Range   TSH 1.239  0.350 - 4.500 uIU/mL  COMPREHENSIVE METABOLIC PANEL     Status: Abnormal   Collection Time    10/03/12  2:45 AM      Result Value Range   Sodium 135  135 - 145 mEq/L  Potassium 3.5  3.5 - 5.1 mEq/L   Chloride 102  96 - 112 mEq/L   Comment: RESULT REPEATED AND VERIFIED     DELTA CHECK NOTED   CO2 26  19 - 32 mEq/L   Glucose, Bld 297 (*) 70 - 99 mg/dL   BUN 13  6 - 23 mg/dL   Creatinine, Ser 0.71  0.50 - 1.10 mg/dL   Calcium 7.8 (*) 8.4 - 10.5 mg/dL   Total Protein 5.5 (*) 6.0 - 8.3 g/dL   Albumin 2.5 (*) 3.5 - 5.2 g/dL   AST 12  0 - 37 U/L    ALT 11  0 - 35 U/L   Alkaline Phosphatase 96  39 - 117 U/L   Total Bilirubin 0.2 (*) 0.3 - 1.2 mg/dL   GFR calc non Af Amer >90  >90 mL/min   GFR calc Af Amer >90  >90 mL/min   Comment:            The eGFR has been calculated     using the CKD EPI equation.     This calculation has not been     validated in all clinical     situations.     eGFR's persistently     <90 mL/min signify     possible Chronic Kidney Disease.  CBC     Status: Abnormal   Collection Time    10/03/12  2:45 AM      Result Value Range   WBC 5.9  4.0 - 10.5 K/uL   RBC 4.25  3.87 - 5.11 MIL/uL   Hemoglobin 11.9 (*) 12.0 - 15.0 g/dL   HCT 34.4 (*) 36.0 - 46.0 %   MCV 80.9  78.0 - 100.0 fL   MCH 28.0  26.0 - 34.0 pg   MCHC 34.6  30.0 - 36.0 g/dL   RDW 13.2  11.5 - 15.5 %   Platelets 157  150 - 400 K/uL  TROPONIN I     Status: None   Collection Time    10/03/12  2:45 AM      Result Value Range   Troponin I <0.30  <0.30 ng/mL   Comment:            Due to the release kinetics of cTnI,     a negative result within the first hours     of the onset of symptoms does not rule out     myocardial infarction with certainty.     If myocardial infarction is still suspected,     repeat the test at appropriate intervals.  GLUCOSE, CAPILLARY     Status: Abnormal   Collection Time    10/03/12  7:32 AM      Result Value Range   Glucose-Capillary 237 (*) 70 - 99 mg/dL  URINALYSIS, ROUTINE W REFLEX MICROSCOPIC     Status: Abnormal   Collection Time    10/03/12  7:50 AM      Result Value Range   Color, Urine YELLOW  YELLOW   APPearance TURBID (*) CLEAR   Specific Gravity, Urine 1.039 (*) 1.005 - 1.030   pH 5.5  5.0 - 8.0   Glucose, UA >1000 (*) NEGATIVE mg/dL   Hgb urine dipstick MODERATE (*) NEGATIVE   Bilirubin Urine NEGATIVE  NEGATIVE   Ketones, ur NEGATIVE  NEGATIVE mg/dL   Protein, ur 30 (*) NEGATIVE mg/dL   Urobilinogen, UA 0.2  0.0 - 1.0 mg/dL   Nitrite POSITIVE (*) NEGATIVE  Leukocytes, UA MODERATE  (*) NEGATIVE  URINE MICROSCOPIC-ADD ON     Status: None   Collection Time    10/03/12  7:50 AM      Result Value Range   WBC, UA TOO NUMEROUS TO COUNT  <3 WBC/hpf   Urine-Other FIELD OBSCURED BY WBC'S    TROPONIN I     Status: None   Collection Time    10/03/12  8:52 AM      Result Value Range   Troponin I <0.30  <0.30 ng/mL   Comment:            Due to the release kinetics of cTnI,     a negative result within the first hours     of the onset of symptoms does not rule out     myocardial infarction with certainty.     If myocardial infarction is still suspected,     repeat the test at appropriate intervals.  GLUCOSE, CAPILLARY     Status: Abnormal   Collection Time    10/03/12 11:45 AM      Result Value Range   Glucose-Capillary 214 (*) 70 - 99 mg/dL  TROPONIN I     Status: None   Collection Time    10/03/12  2:47 PM      Result Value Range   Troponin I <0.30  <0.30 ng/mL   Comment:            Due to the release kinetics of cTnI,     a negative result within the first hours     of the onset of symptoms does not rule out     myocardial infarction with certainty.     If myocardial infarction is still suspected,     repeat the test at appropriate intervals.    IMAGING: Dg Chest Port 1 View  10/02/2012  *RADIOLOGY REPORT*  Clinical Data: Chest pain  PORTABLE CHEST - 1 VIEW  Comparison: December 25, 2011.  Findings: Cardiomediastinal silhouette appears normal.  No acute pulmonary disease is noted.  Bony thorax is intact.  IMPRESSION: No acute cardiopulmonary abnormality seen.   Original Report Authenticated By: Marijo Conception.,  M.D.     IMPRESSION:  Principal Problem:   Chest pain at rest  Active Problems:   DM (diabetes mellitus), type 2, uncontrolled   UTI (urinary tract infection)- culture pending   Normal coronary arteries May 2011   MITRAL REGURGITATION, mild by echo 10/03/12   Family history CAD, father had DM, CAD- died in his 29s   RECOMMENDATION: Pt has a  history of chest pain. She had low risk Myoview in June 2010, a negative stress echo May 3d 2011 and a negative cath May 4th 2011. D Dimer was WNL on admission at 0.28 so CTA not done. MD to see.   Time Spent Directly with Patient: 40 minutes  KILROY,LUKE K 10/03/2012, 4:48 PM  Agree with note written by Kerin Ransom PAC  Atypical CP, clean cath 3 years ago. + CRF. Enz neg. EKG w/o acute changes. Exam benign. Doubt cardiac cause of CP. Would D/C home in AM if she remains CP free, otherwise Lexiscan myoview and empiric antireflux Rx.   Lorretta Harp 10/03/2012 5:10 PM

## 2012-10-03 NOTE — Progress Notes (Signed)
Inpatient Diabetes Program Recommendations  AACE/ADA: New Consensus Statement on Inpatient Glycemic Control (2013)  Target Ranges:  Prepandial:   less than 140 mg/dL      Peak postprandial:   less than 180 mg/dL (1-2 hours)      Critically ill patients:  140 - 180 mg/dL   Reason for Visit: Elevated HgbA1C and Hyperglycemia  Pt states she did not take her Lantus and Novolog prior to coming to ED because she was not eating.  PCP is [facility] in [place].  First appt was 3 months ago.  Was given Lantus, Novolog, meter and strips.  States she has no money for insulin, supplies or doctor appt. ($40 fee for [facility]).  Prior to MD appt 3 months ago, pt was not taking insulin, trying to control blood sugars with diet.  States CBGs haven't been under 300 in a long time.  Son mentioned that pt has been diagnosed with DM since 2000 and had "mild heart attack" in 2007 in [place].  Hoping to find job in order to have insurance to cover meds.  States she would like to go to OP Diabetes Education - has been before back in 2000. Results for KINDY, MISURACA (MRN [id number]) as of 10/03/2012 11:31  Ref. Range 10/03/2012 02:45  Sodium Latest Range: 135-145 mEq/L 135  Potassium Latest Range: 3.5-5.1 mEq/L 3.5  Chloride Latest Range: 96-112 mEq/L 102  CO2 Latest Range: 19-32 mEq/L 26  BUN Latest Range: 6-23 mg/dL 13  Creatinine Latest Range: 0.50-1.10 mg/dL 0.71  Calcium Latest Range: 8.4-10.5 mg/dL 7.8 (L)  GFR calc non Af Amer Latest Range: >90 mL/min >90  GFR calc Af Amer Latest Range: >90 mL/min >90  Glucose Latest Range: 70-99 mg/dL 297 (H)  Results for AZIRA, MCCLELAND (MRN [id number]) as of 10/03/2012 11:31  Ref. Range 10/02/2012 19:09 10/02/2012 23:15 10/03/2012 07:32  Glucose-Capillary Latest Range: 70-99 mg/dL 288 (H) 204 (H) 237 (H)  Results for ANNISSA, REIS (MRN [id number]) as of 10/03/2012 11:31  Ref. Range 10/02/2012 22:25  Hemoglobin A1C Latest Range: <5.7 % 16.8 (H)   Uncontrolled blood sugars  in outpatient setting d/t financial situation and lack of insurance.   Long discussion regarding importance of taking meds as prescribed, even when she is unable to eat.  Will need more strips at discharge, along with insulin.  Inpatient Diabetes Program Recommendations Insulin - Meal Coverage: Add meal coverage insulin - Novolog 4 units tidwc HgbA1C: 16.8 - Uncontrolled  Outpatient Referral: Will order OP Diabetes Education - pt states she would like to attend.   Note: Would benefit from care management consult and/or social work consult for assistance with obtaining meds.  Thank you. Lorenda Peck, RD, LDN, CDE Inpatient Diabetes Coordinator [phone]

## 2012-10-04 ENCOUNTER — Other Ambulatory Visit: Payer: Self-pay

## 2012-10-04 ENCOUNTER — Ambulatory Visit (HOSPITAL_COMMUNITY): Payer: Self-pay

## 2012-10-04 ENCOUNTER — Ambulatory Visit (HOSPITAL_COMMUNITY): Payer: Self-pay | Attending: Cardiology

## 2012-10-04 ENCOUNTER — Encounter (HOSPITAL_COMMUNITY): Payer: Self-pay

## 2012-10-04 DIAGNOSIS — N390 Urinary tract infection, site not specified: Secondary | ICD-10-CM

## 2012-10-04 DIAGNOSIS — R059 Cough, unspecified: Secondary | ICD-10-CM

## 2012-10-04 DIAGNOSIS — R05 Cough: Secondary | ICD-10-CM

## 2012-10-04 LAB — GLUCOSE, CAPILLARY
Glucose-Capillary: 297 mg/dL — ABNORMAL HIGH (ref 70–99)
Glucose-Capillary: 271 mg/dL — ABNORMAL HIGH (ref 70–99)
Glucose-Capillary: 159 mg/dL — ABNORMAL HIGH (ref 70–99)
Glucose-Capillary: 230 mg/dL — ABNORMAL HIGH (ref 70–99)
Glucose-Capillary: 245 mg/dL — ABNORMAL HIGH (ref 70–99)

## 2012-10-04 LAB — LIPID PANEL
Cholesterol: 238 mg/dL — ABNORMAL HIGH (ref 0–200)
HDL: 48 mg/dL (ref >39)
LDL Cholesterol: 157 mg/dL — ABNORMAL HIGH (ref 0–99)
Total CHOL/HDL Ratio: 5 RATIO
Triglycerides: 166 mg/dL — ABNORMAL HIGH (ref <150)
VLDL: 33 mg/dL (ref 0–40)

## 2012-10-04 MED ORDER — CIPROFLOXACIN IN D5W 400 MG/200ML IV SOLN
400.0000 mg | Freq: Two times a day (BID) | INTRAVENOUS | Status: DC
  Administered 2012-10-04 – 2012-10-05 (×2): 400 mg via INTRAVENOUS
  Filled 2012-10-04 (×2): qty 200

## 2012-10-04 MED ORDER — CIPROFLOXACIN HCL 500 MG PO TABS: 500.0000 mg | ORAL_TABLET | Freq: Two times a day (BID) | ORAL | Status: DC

## 2012-10-04 MED ORDER — INSULIN GLARGINE 100 UNIT/ML ~~LOC~~ SOLN: 30.0000 [IU] | Freq: Every day | SUBCUTANEOUS | Status: DC

## 2012-10-04 MED ORDER — ONDANSETRON HCL 4 MG PO TABS: 4.0000 mg | ORAL_TABLET | Freq: Three times a day (TID) | ORAL | Status: DC | PRN

## 2012-10-04 MED ORDER — PANTOPRAZOLE SODIUM 40 MG PO TBEC: 40.0000 mg | DELAYED_RELEASE_TABLET | Freq: Every day | ORAL | Status: DC

## 2012-10-04 MED ORDER — TECHNETIUM TC 99M SESTAMIBI GENERIC - CARDIOLITE
30.0000 | Freq: Once | INTRAVENOUS | Status: AC | PRN
  Administered 2012-10-04: 30 via INTRAVENOUS

## 2012-10-04 MED ORDER — REGADENOSON 0.4 MG/5ML IV SOLN
0.4000 mg | Freq: Once | INTRAVENOUS | Status: AC
  Administered 2012-10-04: 0.4 mg via INTRAVENOUS
  Filled 2012-10-04: qty 5

## 2012-10-04 MED ORDER — TECHNETIUM TC 99M SESTAMIBI GENERIC - CARDIOLITE
10.0000 | Freq: Once | INTRAVENOUS | Status: AC | PRN
  Administered 2012-10-04: 10 via INTRAVENOUS

## 2012-10-04 NOTE — Progress Notes (Signed)
Chest pain last night. I have informed nuclear medicine we will proceed with Myoview as per Dr Kennon Holter recommendations.    Kerin Ransom PA-C 10/04/2012 9:39 AM

## 2012-10-04 NOTE — Progress Notes (Signed)
Inpatient Diabetes Program Recommendations  AACE/ADA: New Consensus Statement on Inpatient Glycemic Control (2013)  Target Ranges:  Prepandial:   less than 140 mg/dL      Peak postprandial:   less than 180 mg/dL (1-2 hours)      Critically ill patients:  140 - 180 mg/dL   Reason for Visit: Hyperglycemia  Unsure why pt did not receive Lantus 30 units QHS which was ordered on 10/03/2012. Please see Diabetes Coordinator progress note from 4/1. Would also recommend increasing meal coverage insulin to Novolog 6 units tidwc.  Results for AISLING, [name] [name] (MRN [id number]) as of 10/04/2012 09:16  Ref. Range 10/03/2012 07:32 10/03/2012 11:45 10/03/2012 17:53 10/03/2012 20:10 10/04/2012 04:12 10/04/2012 07:54  Glucose-Capillary Latest Range: 70-99 mg/dL 237 (H) 214 (H) 285 (H) 274 (H) 297 (H) 271 (H)   10/03/2012 recommendations:  Inpatient Diabetes Program Recommendations Insulin - Meal Coverage: Add meal coverage insulin - Novolog 4 units tidwc HgbA1C: 16.8 - Uncontrolled  Outpatient Referral: Will order OP Diabetes Education - pt states she would like to attend.   Note: Pt NPO for tests.  Will f/u for any questions she or son may have.    Thank you. Lorenda Peck, RD, LDN, CDE Inpatient Diabetes Coordinator [phone]

## 2012-10-04 NOTE — Discharge Summary (Signed)
Physician Discharge Summary  [name] [name] [id number] DOB: 1966/01/05 DOA: 10/02/2012  PCP: Marijean Bravo, MD  Admit date: 10/02/2012 Discharge date: 10/04/2012    Recommendations for Outpatient Follow-up:  1. Follow up with PCP in one week.   Discharge Diagnoses:  Principal Problem:   Chest pain at rest Active Problems:   MITRAL REGURGITATION, mild   DM (diabetes mellitus), type 2, uncontrolled   Family history CAD, father had DM, CAD- died in his 47s   UTI (urinary tract infection)   Normal coronary arteries May 2011   Discharge Condition: fair  Diet recommendation: carb modified diet  Filed Weights   10/02/12 2200  Weight: 74.526 kg (164 lb 4.8 oz)    History of present illness:   [name] [name] is a 47 y.o. female  has a past medical history of Diabetes mellitus. Presented with At 2 pm patient was driving and started to have chest pain it felt like a pinch the pain would only last few seconds but became progressively worse it was coming and going. Currently she is chest pain free. The pain did not improve with nitro but got better with GI cocktail and morphine. She has had nausea for past 2-3 days she denies any vomiting. Today she felt some shortness of breath but this has resolved. She went to the beach last week 3 hour drive. Denies any leg swelling. Pain is non pleuritic.    Hospital Course:  . Chest pain at rest - currently pain free, stable on telemetry, -ve cardiac enzymes, she has uncontrolled diabetes mellitus, d-dimer unremarkable, chest x-ray stable, nonacute EKG,  requested cardiology to evaluate the patient for possible noninvasive stress test, which wasdone today and was negative for inducible ischemia. Cardiology signed off. Started her on protonix.    . DM (diabetes mellitus), type 2, uncontrolled - extremely elevated A1c, we'll increase Lantus to 30 units,   UTI; growing E COLI and sensitive to cephalosporins and discharged on keflex.      Procedures: myoview scan negative for chest pain and ischemia.  Consultations:  Cardiology   Discharge Exam: Filed Vitals:   10/04/12 1203 10/04/12 1205 10/04/12 1208 10/04/12 1345  BP: 146/63 128/78 133/81 129/86  Pulse: 100 91 94 97  Temp:    97.6 F (36.4 C)  TempSrc:    Oral  Resp:      Height:      Weight:      SpO2:    100%   Awake Alert, Oriented X 3, No new F.N deficits, Normal affect  [place].AT,PERRAL  Supple Neck,No JVD, No cervical lymphadenopathy appriciated.  Symmetrical Chest wall movement, Good air movement bilaterally, CTAB  RRR,No Gallops,Rubs or new Murmurs, No Parasternal Heave  +ve B.Sounds, Abd Soft, Non tender, No organomegaly appriciated, No rebound - guarding or rigidity.  No Cyanosis, Clubbing or edema, No new Rash or bruise    Discharge Instructions  Discharge Orders   Future Orders Complete By Expires     Discharge instructions  As directed     Comments:      Follow up with pcp in one week.        Medication List    TAKE these medications       aspirin EC 81 MG tablet  Take 81 mg by mouth every morning.     insulin aspart 100 UNIT/ML injection  Commonly known as:  novoLOG  Inject 15-30 Units into the skin 3 (three) times daily before meals. Per sliding scale  insulin glargine 100 UNIT/ML injection  Commonly known as:  LANTUS  Inject 0.3 mLs (30 Units total) into the skin at bedtime.     ondansetron 4 MG tablet  Commonly known as:  ZOFRAN  Take 1 tablet (4 mg total) by mouth every 8 (eight) hours as needed for nausea.     pantoprazole 40 MG tablet  Commonly known as:  PROTONIX  Take 1 tablet (40 mg total) by mouth daily.          The results of significant diagnostics from this hospitalization (including imaging, microbiology, ancillary and laboratory) are listed below for reference.    Significant Diagnostic Studies: Nm Myocar Multi W/spect W/wall Motion / Ef  10/04/2012  *RADIOLOGY REPORT*  Clinical Data:  Chest  pain.  MYOCARDIAL IMAGING WITH SPECT (REST AND PHARMACOLOGIC-STRESS) GATED LEFT VENTRICULAR WALL MOTION STUDY LEFT VENTRICULAR EJECTION FRACTION  Technique:  Standard myocardial SPECT imaging was performed after resting intravenous injection of 10 mCi Tc-66m sestamibi. Subsequently, intravenous infusion of Lexiscan was performed under the supervision of the Cardiology staff.  At peak effect of the drug, 30 mCi Tc-35m sestamibi was injected intravenously and standard myocardial SPECT  imaging was performed.  Quantitative gated imaging was also performed to evaluate left ventricular wall motion, and estimate left ventricular ejection fraction.  Comparison:  None  Findings: SPECT imaging demonstrates decreased activity inferiorly on the rest images, improving on stress images.  This area moves and thickens normally compatible with diaphragmatic attenuation.  Quantitative gated analysis shows normal wall motion.  The resting left ventricular ejection fraction is 68% with end- diastolic volume of 48 ml and end-systolic volume of 15 ml.  IMPRESSION: No evidence of ischemia or infarction.  Ejection fraction 68%.   Original Report Authenticated By: Rolm Baptise, M.D.    Dg Chest Port 1 View  10/02/2012  *RADIOLOGY REPORT*  Clinical Data: Chest pain  PORTABLE CHEST - 1 VIEW  Comparison: December 25, 2011.  Findings: Cardiomediastinal silhouette appears normal.  No acute pulmonary disease is noted.  Bony thorax is intact.  IMPRESSION: No acute cardiopulmonary abnormality seen.   Original Report Authenticated By: Marijo Conception.,  M.D.     Microbiology: Recent Results (from the past 240 hour(s))  URINE CULTURE     Status: None   Collection Time    10/03/12  7:50 AM      Result Value Range Status   Specimen Description URINE, CLEAN CATCH   Final   Special Requests NONE   Final   Culture  Setup Time 10/03/2012 10:56   Final   Colony Count >=100,000 COLONIES/ML   Final   Culture ESCHERICHIA COLI   Final   Report  Status PENDING   Incomplete     Labs: Basic Metabolic Panel:  Recent Labs Lab 10/02/12 1440 10/02/12 2225 10/03/12 0245  NA 129*  --  135  K 4.2  --  3.5  CL 91*  --  102  CO2 23  --  26  GLUCOSE 546*  --  297*  BUN 9  --  13  CREATININE 0.80 0.67 0.71  CALCIUM 8.9  --  7.8*  MG  --   --  2.0  PHOS  --   --  3.7   Liver Function Tests:  Recent Labs Lab 10/03/12 0245  AST 12  ALT 11  ALKPHOS 96  BILITOT 0.2*  PROT 5.5*  ALBUMIN 2.5*   No results found for this basename: LIPASE, AMYLASE,  in the last 168  hours No results found for this basename: AMMONIA,  in the last 168 hours CBC:  Recent Labs Lab 10/02/12 1440 10/02/12 2225 10/03/12 0245  WBC 7.1 6.2 5.9  HGB 13.8 12.7 11.9*  HCT 39.8 37.0 34.4*  MCV 81.4 80.8 80.9  PLT 193 176 157   Cardiac Enzymes:  Recent Labs Lab 10/03/12 0245 10/03/12 0852 10/03/12 1447  TROPONINI <0.30 <0.30 <0.30   BNP: BNP (last 3 results) No results found for this basename: PROBNP,  in the last 8760 hours CBG:  Recent Labs Lab 10/03/12 2010 10/04/12 0412 10/04/12 0754 10/04/12 1343 10/04/12 1651  GLUCAP 274* 297* 271* 159* 230*       Signed:  Lloyd Ayo  [facility] 10/04/2012, 5:52 PM

## 2012-10-04 NOTE — Progress Notes (Signed)
[facility]                                                                                Patient Demographics  [name] [name], is a 47 y.o. female, DOB - 02-23-1966, [id number], [id number]  Admit date - 10/02/2012  Admitting Physician Toy Baker, MD  Outpatient Primary MD for the patient is TALBOT, DAVID [name], MD  LOS - 2   Chief Complaint  Patient presents with  . Chest Pain        Assessment & Plan    . Chest pain at rest - currently pain free, stable on telemetry, -ve cardiac enzymes, she has uncontrolled diabetes mellitus, d-dimer unremarkable, chest x-ray stable, nonacute EKG, echo pending, requested cardiology to evaluate the patient for possible noninvasive stress test, which wasdone today and was negative for inducible ischemia.  Cardiology signed off. Started her on protonix.      Marland Kitchen MITRAL REGURGITATION - will obtain 2-D echo to quantify this further     . DM (diabetes mellitus), type 2, uncontrolled - extremely elevated A1c, we'll increase Lantus to 30 units, will add pre-meal NovoLog and continue sliding scale insulin and monitor sugars closely.   Lab Results  Component Value Date   HGBA1C 16.8* 10/02/2012   CBG (last 3)   Recent Labs  10/04/12 0754 10/04/12 1343 10/04/12 1651  GLUCAP 271* 159* 230*   UTI: STARTED her on IV cipro.    Code Status: full  Family Communication: patient  Disposition Plan: home   Procedures CXR, Echo   Consults  Cards   DVT Prophylaxis  Lovenox   Lab Results  Component Value Date   PLT 157 10/03/2012    Medications  Scheduled Meds: . aspirin EC  81 mg Oral q morning - 10a  . ciprofloxacin  400 mg Intravenous Q12H  . docusate sodium  100 mg Oral BID  . enoxaparin (LOVENOX) injection  40 mg Subcutaneous Q24H  . insulin aspart  0-15 Units Subcutaneous TID WC  . insulin aspart  0-5 Units Subcutaneous QHS  . insulin aspart  3 Units Subcutaneous TID WC  . insulin glargine  30 Units  Subcutaneous QHS  . ondansetron (ZOFRAN) IV  4 mg Intravenous Once  . sodium chloride  3 mL Intravenous Q12H   Continuous Infusions: . sodium chloride 10 mL/hr at 10/03/12 0838   PRN Meds:.acetaminophen, acetaminophen, HYDROcodone-acetaminophen, morphine injection, nitroGLYCERIN, ondansetron (ZOFRAN) IV, ondansetron  Antibiotics     Anti-infectives   Start     Dose/Rate Route Frequency Ordered Stop   10/04/12 2000  ciprofloxacin (CIPRO) IVPB 400 mg     400 mg 200 mL/hr over 60 Minutes Intravenous Every 12 hours 10/04/12 1845     10/04/12 0000  ciprofloxacin (CIPRO) 500 MG tablet     500 mg Oral 2 times daily 10/04/12 1842         Time Spent in minutes  22   Hunt Zajicek M.D on 10/04/2012 at 7:08 PM  Between 7am to 7pm - Pager - [phone]  After 7pm go to [email or web address] - password [email or web address]  And look for the night coverage person covering for me after hours  [facility] Office  [phone]    Subjective:   Dezerea Qui today has, No headache, No chest pain, No abdominal pain - No Nausea, No new weakness tingling or numbness, No Cough - SOB.   Objective:   Filed Vitals:   10/04/12 1203 10/04/12 1205 10/04/12 1208 10/04/12 1345  BP: 146/63 128/78 133/81 129/86  Pulse: 100 91 94 97  Temp:    97.6 F (36.4 C)  TempSrc:    Oral  Resp:      Height:      Weight:      SpO2:    100%    Wt Readings from Last 3 Encounters:  10/02/12 74.526 kg (164 lb 4.8 oz)  12/25/11 74.844 kg (165 lb)  08/30/11 72.576 kg (160 lb)     Intake/Output Summary (Last 24 hours) at 10/04/12 1908 Last data filed at 10/04/12 1827  Gross per 24 hour  Intake   1240 ml  Output    693 ml  Net    547 ml    Exam Awake Alert, Oriented X 3, No new F.N deficits, Normal affect [place].AT,PERRAL Supple Neck,No JVD, No cervical lymphadenopathy appriciated.  Symmetrical Chest wall movement, Good air movement bilaterally, CTAB RRR,No Gallops,Rubs or new Murmurs, No Parasternal Heave +ve  B.Sounds, Abd Soft, Non tender, No organomegaly appriciated, No rebound - guarding or rigidity. No Cyanosis, Clubbing or edema, No new Rash or bruise      Data Review   Micro Results Recent Results (from the past 240 hour(s))  URINE CULTURE     Status: None   Collection Time    10/03/12  7:50 AM      Result Value Range Status   Specimen Description URINE, CLEAN CATCH   Final   Special Requests NONE   Final   Culture  Setup Time 10/03/2012 10:56   Final   Colony Count >=100,000 COLONIES/ML   Final   Culture ESCHERICHIA COLI   Final   Report Status PENDING   Incomplete    Radiology Reports Dg Chest Port 1 View  10/02/2012  *RADIOLOGY REPORT*  Clinical Data: Chest pain  PORTABLE CHEST - 1 VIEW  Comparison: December 25, 2011.  Findings: Cardiomediastinal silhouette appears normal.  No acute pulmonary disease is noted.  Bony thorax is intact.  IMPRESSION: No acute cardiopulmonary abnormality seen.   Original Report Authenticated By: Marijo Conception.,  M.D.     CBC  Recent Labs Lab 10/02/12 1440 10/02/12 2225 10/03/12 0245  WBC 7.1 6.2 5.9  HGB 13.8 12.7 11.9*  HCT 39.8 37.0 34.4*  PLT 193 176 157  MCV 81.4 80.8 80.9  MCH 28.2 27.7 28.0  MCHC 34.7 34.3 34.6  RDW 13.0 13.2 13.2    Chemistries   Recent Labs Lab 10/02/12 1440 10/02/12 2225 10/03/12 0245  NA 129*  --  135  K 4.2  --  3.5  CL 91*  --  102  CO2 23  --  26  GLUCOSE 546*  --  297*  BUN 9  --  13  CREATININE 0.80 0.67 0.71  CALCIUM 8.9  --  7.8*  MG  --   --  2.0  AST  --   --  12  ALT  --   --  11  ALKPHOS  --   --  96  BILITOT  --   --  0.2*   ------------------------------------------------------------------------------------------------------------------ estimated creatinine clearance is 79.2 ml/min (by C-G formula based on Cr of 0.71). ------------------------------------------------------------------------------------------------------------------  Recent Labs  10/02/12 2225  HGBA1C 16.8*    ------------------------------------------------------------------------------------------------------------------  Recent Labs  10/03/12 0852  CHOL 238*  HDL 48  LDLCALC 157*  TRIG 166*  CHOLHDL 5.0   ------------------------------------------------------------------------------------------------------------------  Recent Labs  10/03/12 0245  TSH 1.239   ------------------------------------------------------------------------------------------------------------------ No results found for this basename: VITAMINB12, FOLATE, FERRITIN, TIBC, IRON, RETICCTPCT,  in the last 72 hours  Coagulation profile No results found for this basename: INR, PROTIME,  in the last 168 hours   Recent Labs  10/02/12 2225  DDIMER 0.28    Cardiac Enzymes  Recent Labs Lab 10/03/12 0245 10/03/12 0852 10/03/12 1447  TROPONINI <0.30 <0.30 <0.30   ------------------------------------------------------------------------------------------------------------------ No components found with this basename: POCBNP,

## 2012-10-04 NOTE — Progress Notes (Addendum)
Pt reports that she had chest pain from 1700 to 2300.  Frequent sharp bursts.  Gave pt vicodin at HS and pt slept well until 0500.  She woke up and denies CP this AM but does report nausea thought to be brought on by husbands heavy scented soap as he got ready for work this AM.  Given zofran and nausea is relieved.   VSS and telemetry stable SR throughout shift.  And pain was decreased when HOB remained at least 30 degree elevated.

## 2012-10-04 NOTE — Progress Notes (Signed)
Subjective:  More chest pain last night-"dull ache"  Objective:  Vital Signs in the last 24 hours: Temp:  [98.3 F (36.8 C)-98.6 F (37 C)] 98.6 F (37 C) (04/02 0524) Pulse Rate:  [62-76] 68 (04/02 1124) Resp:  [14-16] 16 (04/02 0524) BP: (119-135)/(76-88) 134/88 mmHg (04/02 1124) SpO2:  [99 %-100 %] 99 % (04/02 0524)  Intake/Output from previous day:  Intake/Output Summary (Last 24 hours) at 10/04/12 1204 Last data filed at 10/04/12 0400  Gross per 24 hour  Intake 1763.67 ml  Output    990 ml  Net 773.67 ml    Physical Exam: General appearance: alert, cooperative and no distress Lungs: clear to auscultation bilaterally Heart: regular rate and rhythm, S1, S2 normal, no murmur, click, rub or gallop   Rate: 80  Rhythm: normal sinus rhythm  Lab Results:  Recent Labs  10/02/12 2225 10/03/12 0245  WBC 6.2 5.9  HGB 12.7 11.9*  PLT 176 157    Recent Labs  10/02/12 1440 10/02/12 2225 10/03/12 0245  NA 129*  --  135  K 4.2  --  3.5  CL 91*  --  102  CO2 23  --  26  GLUCOSE 546*  --  297*  BUN 9  --  13  CREATININE 0.80 0.67 0.71    Recent Labs  10/03/12 0852 10/03/12 1447  TROPONINI <0.30 <0.30   Hepatic Function Panel  Recent Labs  10/03/12 0245  PROT 5.5*  ALBUMIN 2.5*  AST 12  ALT 11  ALKPHOS 96  BILITOT 0.2*    Recent Labs  10/03/12 0852  CHOL 238*   No results found for this basename: INR,  in the last 72 hours  Imaging: Imaging results have been reviewed  Cardiac Studies:  Assessment/Plan:   Principal Problem:   Chest pain at rest Active Problems:   DM (diabetes mellitus), type 2, uncontrolled   UTI (urinary tract infection)   Normal coronary arteries May 2011   MITRAL REGURGITATION, mild   Family history CAD, father had DM, CAD- died in his 65s  Plan- per Dr Gwenlyn Found- Myoview today    Kerin Ransom PA-C 10/04/2012, 12:04 PM   Agree with note written by Kerin Ransom [facility]  Myoview neg for ischemia. Non cardiac CP. No  further cardiac w/u necessary. Will sign off.   Lorretta Harp 10/04/2012 3:55 PM

## 2012-10-05 LAB — URINE CULTURE: Colony Count: >=100000

## 2012-10-05 LAB — GLUCOSE, CAPILLARY
Glucose-Capillary: 253 mg/dL — ABNORMAL HIGH (ref 70–99)
Glucose-Capillary: 207 mg/dL — ABNORMAL HIGH (ref 70–99)

## 2012-10-05 MED ORDER — CIPROFLOXACIN HCL 500 MG PO TABS
500.0000 mg | ORAL_TABLET | Freq: Two times a day (BID) | ORAL | Status: DC
  Filled 2012-10-05 (×2): qty 1

## 2012-10-05 MED ORDER — POLYETHYLENE GLYCOL 3350 17 G PO PACK
17.0000 g | PACK | Freq: Every day | ORAL | Status: DC
  Administered 2012-10-05: 17 g via ORAL
  Filled 2012-10-05: qty 1

## 2012-10-05 MED ORDER — CEPHALEXIN 500 MG PO CAPS: 500.0000 mg | ORAL_CAPSULE | Freq: Two times a day (BID) | ORAL | Status: DC

## 2012-10-05 NOTE — Progress Notes (Signed)
Patient qualifies for the [place] ( Medication assistance program at [facility])- patient is to take the prescriptions along with the [facility] letter to a participating pharmacy to get her medication filled at $3.00 per prescription; CM instructed patient to go to [facility] outpatient pharmacy to get her medication filled and if there were any problems they would contact me. Patient stated that she could pay $3.00 / copay for each prescription and was very appreciated for the help.

## 2012-10-05 NOTE — Progress Notes (Signed)
Talked to patient about follow up medical care; patient goes to the [facility]- fee is $5.00 per visit; patient lost her job and recently ran out of her unemployment; Patient stated that she is planning to attend some classes to improve her chances of finding another job; Independent prior to admission, lives alone- patient is also thinking about seeing another physician in [place] - resources given; encouraged patient to continue to see her primary care physician in [facility] and they can make the appropriate, referrals as needed. Encouraged pt to talk to family members for financial assistance as needed. Patient stated that she has 3 insulin pens at home but need insulin strips- CM informed patient that we do not supply any glucometer strips due to the various glucometer machines.

## 2012-10-05 NOTE — Progress Notes (Signed)
PHARMACIST - PHYSICIAN COMMUNICATION DR:   Karleen Hampshire  CONCERNING: Antibiotic IV to Oral Route Change Policy  RECOMMENDATION: This patient is receiving ciprofloxacin by the intravenous route.  Based on criteria approved by the Pharmacy and Therapeutics Committee, the antibiotic(s) is/are being converted to the equivalent oral dose form(s).   DESCRIPTION: These criteria include:  Patient being treated for a respiratory tract infection, urinary tract infection, or cellulitis  The patient is not neutropenic and does not exhibit a GI malabsorption state  The patient is eating (either orally or via tube) and/or has been taking other orally administered medications for a least 24 hours  The patient is improving clinically and has a Tmax < 100.5  If you have questions about this conversion, please contact the Pharmacy Department  []   [phone] )  [facility] []   [phone] )  [facility]  []   [phone] )  [facility] [x]   [phone] )  [place], PharmD, BCPS.   Pager: [phone]  10/05/2012 11:31 AM

## 2012-10-06 LAB — URINE CULTURE: Colony Count: >=100000

## 2012-12-21 ENCOUNTER — Emergency Department (HOSPITAL_BASED_OUTPATIENT_CLINIC_OR_DEPARTMENT_OTHER): Payer: Self-pay

## 2012-12-21 ENCOUNTER — Emergency Department (HOSPITAL_BASED_OUTPATIENT_CLINIC_OR_DEPARTMENT_OTHER)
Admission: EM | Admit: 2012-12-21 | Discharge: 2012-12-21 | Disposition: A | Discharge status: Home / Self Care | Payer: Self-pay | Attending: Emergency Medicine | Admitting: Emergency Medicine

## 2012-12-21 ENCOUNTER — Encounter (HOSPITAL_BASED_OUTPATIENT_CLINIC_OR_DEPARTMENT_OTHER): Payer: Self-pay | Admitting: *Deleted

## 2012-12-21 DIAGNOSIS — IMO0002 Reserved for concepts with insufficient information to code with codable children: Secondary | ICD-10-CM | POA: Insufficient documentation

## 2012-12-21 DIAGNOSIS — E119 Type 2 diabetes mellitus without complications: Secondary | ICD-10-CM | POA: Insufficient documentation

## 2012-12-21 DIAGNOSIS — R21 Rash and other nonspecific skin eruption: Secondary | ICD-10-CM | POA: Insufficient documentation

## 2012-12-21 DIAGNOSIS — R5381 Other malaise: Secondary | ICD-10-CM | POA: Insufficient documentation

## 2012-12-21 DIAGNOSIS — M545 Low back pain, unspecified: Secondary | ICD-10-CM | POA: Insufficient documentation

## 2012-12-21 DIAGNOSIS — Z794 Long term (current) use of insulin: Secondary | ICD-10-CM | POA: Insufficient documentation

## 2012-12-21 DIAGNOSIS — M79609 Pain in unspecified limb: Secondary | ICD-10-CM | POA: Insufficient documentation

## 2012-12-21 DIAGNOSIS — M5416 Radiculopathy, lumbar region: Secondary | ICD-10-CM

## 2012-12-21 DIAGNOSIS — Z7982 Long term (current) use of aspirin: Secondary | ICD-10-CM | POA: Insufficient documentation

## 2012-12-21 DIAGNOSIS — Z79899 Other long term (current) drug therapy: Secondary | ICD-10-CM | POA: Insufficient documentation

## 2012-12-21 MED ORDER — OXYCODONE-ACETAMINOPHEN 5-325 MG PO TABS: 1.0000 | ORAL_TABLET | Freq: Four times a day (QID) | ORAL | Status: DC | PRN

## 2012-12-21 MED ORDER — PREDNISONE 20 MG PO TABS: 40.0000 mg | ORAL_TABLET | Freq: Every day | ORAL | Status: DC

## 2012-12-21 MED ORDER — OXYCODONE-ACETAMINOPHEN 5-325 MG PO TABS
1.0000 | ORAL_TABLET | Freq: Once | ORAL | Status: AC
  Administered 2012-12-21: 1 via ORAL
  Filled 2012-12-21 (×2): qty 1

## 2012-12-21 NOTE — ED Provider Notes (Addendum)
History     CSN: [id number]  Arrival date & time 12/21/12  2107   First MD Initiated Contact with Patient 12/21/12 2145      Chief Complaint  Patient presents with  . Numbness    (Consider location/radiation/quality/duration/timing/severity/associated sxs/prior treatment) HPI Comments: Pt states also she developed a rash on her face about 3 weeks ago which caused her face to blister and peel and 3 days ago started on her neck.  Pt denies any itching and is painful.  No drainage and states her face is starting to get better but no on her neck.  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  Patient is a 47 y.o. female presenting with back pain and rash. The history is provided by the patient.  Back Pain Pain location: no true back pain radiates down her left leg with numbness and occasional pain. Quality:  Shooting Radiates to:  L thigh (left anterior leg) Pain severity:  Mild Pain is:  Same all the time Onset quality:  Sudden Duration:  2 days Timing:  Constant Progression:  Waxing and waning Chronicity:  New Context comment:  States woke up with numbness and tingling in the left leg with intermittent sharp pain Relieved by:  Nothing Worsened by:  Nothing tried Ineffective treatments:  None tried Associated symptoms: leg pain, numbness, tingling and weakness   Associated symptoms: no abdominal pain, no bladder incontinence, no bowel incontinence, no dysuria, no fever, no headaches and no perianal numbness   Risk factors: lack of exercise and obesity   Risk factors: no hx of cancer, no hx of osteoporosis and no steroid use   Risk factors comment:  Pt states years ago had a slipped disc Rash Associated symptoms: no dysuria and no fever     Past Medical History  Diagnosis Date  . Diabetes mellitus     Past Surgical History  Procedure Laterality Date  . Abdominal hysterectomy    . Tubal  ligation    . Tonsillectomy    . Cesarean section    . Ganglion cyst excision      Family History  Problem Relation Age of Onset  . Diabetes Father   . Heart attack Father     History  Substance Use Topics  . Smoking status: Never Smoker   . Smokeless tobacco: Never Used  . Alcohol Use: No    OB History   Grav Para Term Preterm Abortions TAB SAB Ect Mult Living                  Review of Systems  Constitutional: Negative for fever.  Gastrointestinal: Negative for abdominal pain and bowel incontinence.  Genitourinary: Negative for bladder incontinence and dysuria.  Musculoskeletal: Positive for back pain.  Skin: Positive for rash.  Neurological: Positive for tingling, weakness and numbness. Negative for headaches.  All other systems reviewed and are negative.    Allergies  Review of patient's allergies indicates no known allergies.  Home Medications   Current Outpatient Rx  Name  Route  Sig  Dispense  Refill  . aspirin EC 81 MG tablet   Oral   Take 81 mg by mouth every morning.         . cephALEXin (KEFLEX) 500 MG capsule   Oral   Take 1 capsule (500 mg total) by mouth 2 (two) times daily.   10 capsule  0   . insulin aspart (NOVOLOG) 100 UNIT/ML injection   Subcutaneous   Inject 15-30 Units into the skin 3 (three) times daily before meals. Per sliding scale         . insulin glargine (LANTUS) 100 UNIT/ML injection   Subcutaneous   Inject 0.3 mLs (30 Units total) into the skin at bedtime.   10 mL   0   . ondansetron (ZOFRAN) 4 MG tablet   Oral   Take 1 tablet (4 mg total) by mouth every 8 (eight) hours as needed for nausea.   20 tablet   0   . pantoprazole (PROTONIX) 40 MG tablet   Oral   Take 1 tablet (40 mg total) by mouth daily.   30 tablet   0     BP 154/96  Pulse 73  Temp(Src) 98.2 F (36.8 C) (Oral)  Resp 20  Wt 164 lb (74.39 kg)  BMI 32.03 kg/m2  SpO2 99%  Physical Exam  Nursing note and vitals reviewed. Constitutional:  She is oriented to person, place, and time. She appears well-developed and well-nourished. No distress.  HENT:  Head: Normocephalic and atraumatic.  Mouth/Throat: Oropharynx is clear and moist.  Eyes: Conjunctivae and EOM are normal. Pupils are equal, round, and reactive to light.  Neck: Normal range of motion. Neck supple.  Cardiovascular: Normal rate, regular rhythm and intact distal pulses.   No murmur heard. Pulmonary/Chest: Effort normal and breath sounds normal. No respiratory distress. She has no wheezes. She has no rales.  Abdominal: Soft. She exhibits no distension. There is no tenderness. There is no rebound and no guarding.  Musculoskeletal: Normal range of motion. She exhibits no edema and no tenderness.       Lumbar back: She exhibits normal range of motion, no bony tenderness and no swelling.  Neurological: She is alert and oriented to person, place, and time. A sensory deficit is present. Coordination and gait normal.  Reflex Scores:      Patellar reflexes are 1+ on the right side and 1+ on the left side. Only slight decrease in strength on the left compared to right.  No foot drop and decreased sensation on the left anterior leg compared with the right most notabley in the L4/5 distribution.    Skin: Skin is warm and dry. Rash noted. Rash is maculopapular. There is erythema.     Erythematous maculopapular rash that resembles a burn to the skin over the back of the neck.  No drainage or induration.  Healing skin over the face with peeling and hypopigmentation  Psychiatric: She has a normal mood and affect. Her behavior is normal.    ED Course  Procedures (including critical care time)  Labs Reviewed - No data to display Dg Lumbar Spine Complete  12/21/2012   *RADIOLOGY REPORT*  Clinical Data: Left leg numbness and weakness.  LUMBAR SPINE - COMPLETE 4+ VIEW  Comparison: No priors.  Findings: Five views of the lumbar spine demonstrate no definite acute displaced fracture or  compression type fracture.  No defects of the pars interarticularis.  Alignment is anatomic.  Mild degenerative disc disease is noted, most pronounced at L5-S1.  IMPRESSION: 1.  No acute radiographic abnormality of the lumbar spine.   Original Report Authenticated By: Vinnie Langton, M.D.     1. Lumbar radiculopathy, acute   2. Rash       MDM    Pt with gradual onset of left leg numbness and occassional pain suggestive of lumbar radiculopathy.  Pt states the leg is weak but on exam can only appreciate mild weakness and no foot drop or difficulty with gait.  No neurovascular compromise and no incontinence.  Pt has no infectious sx, hx of CA  or other red flags concerning for pathologic back pain.  States years ago she had a slipped disc but no problems since. Normal reflexes on exam.  Denies trauma. Lumbar films pending.  Will give short course of steroids for radiculopathy and for the rash which appears to be contact in origin.  Will give pt f/u for the rash with derm and with NSU for her back.  11:17 PM Films with mild degenerative disease but otherwise within normal limits      Blanchie Dessert, MD 12/21/12 2317  Blanchie Dessert, MD 12/21/12 2319

## 2012-12-21 NOTE — ED Notes (Signed)
Pt returned from xray

## 2012-12-21 NOTE — ED Notes (Signed)
Patient transported to X-ray 

## 2012-12-21 NOTE — ED Notes (Signed)
Left leg numbness x 2 days. Denies back injury or pain. Rash on her face and back of her neck started about the same time.

## 2012-12-21 NOTE — ED Notes (Signed)
MD at bedside. 

## 2012-12-28 ENCOUNTER — Encounter (HOSPITAL_BASED_OUTPATIENT_CLINIC_OR_DEPARTMENT_OTHER): Payer: Self-pay

## 2012-12-28 ENCOUNTER — Emergency Department (HOSPITAL_BASED_OUTPATIENT_CLINIC_OR_DEPARTMENT_OTHER)
Admission: EM | Admit: 2012-12-28 | Discharge: 2012-12-28 | Disposition: A | Discharge status: Home / Self Care | Payer: Self-pay | Attending: Emergency Medicine | Admitting: Emergency Medicine

## 2012-12-28 DIAGNOSIS — R209 Unspecified disturbances of skin sensation: Secondary | ICD-10-CM | POA: Insufficient documentation

## 2012-12-28 DIAGNOSIS — Z7982 Long term (current) use of aspirin: Secondary | ICD-10-CM | POA: Insufficient documentation

## 2012-12-28 DIAGNOSIS — Z794 Long term (current) use of insulin: Secondary | ICD-10-CM | POA: Insufficient documentation

## 2012-12-28 DIAGNOSIS — M5416 Radiculopathy, lumbar region: Secondary | ICD-10-CM

## 2012-12-28 DIAGNOSIS — Z792 Long term (current) use of antibiotics: Secondary | ICD-10-CM | POA: Insufficient documentation

## 2012-12-28 DIAGNOSIS — E119 Type 2 diabetes mellitus without complications: Secondary | ICD-10-CM | POA: Insufficient documentation

## 2012-12-28 DIAGNOSIS — IMO0002 Reserved for concepts with insufficient information to code with codable children: Secondary | ICD-10-CM | POA: Insufficient documentation

## 2012-12-28 MED ORDER — OXYCODONE-ACETAMINOPHEN 5-325 MG PO TABS: 1.0000 | ORAL_TABLET | ORAL | Status: DC | PRN

## 2012-12-28 NOTE — ED Provider Notes (Signed)
History    CSN: [id number] Arrival date & time 12/28/12  1445  First MD Initiated Contact with Patient 12/28/12 1524     Chief Complaint  Patient presents with  . Leg Pain   (Consider location/radiation/quality/duration/timing/severity/associated sxs/prior Treatment) HPI Comments: Pt states that she has been having symptoms for the last couple of weeks:where she has pain and numbness that radiates down her left leg:pt states that she was treated with steroids and pain medication and the symptoms of numbness has gotten better but she is having shooting pain that makes her feel like the leg is going to give out  Patient is a 47 y.o. female presenting with leg pain. The history is provided by the patient. No language interpreter was used.  Leg Pain Location:  Leg Injury: no   Pain details:    Quality:  Aching, burning and tingling   Radiates to:  Does not radiate  Past Medical History  Diagnosis Date  . Diabetes mellitus    Past Surgical History  Procedure Laterality Date  . Abdominal hysterectomy    . Tubal ligation    . Tonsillectomy    . Cesarean section    . Ganglion cyst excision    . Cholecystectomy     Family History  Problem Relation Age of Onset  . Diabetes Father   . Heart attack Father    History  Substance Use Topics  . Smoking status: Never Smoker   . Smokeless tobacco: Never Used  . Alcohol Use: No   OB History   Grav Para Term Preterm Abortions TAB SAB Ect Mult Living                 Review of Systems  Constitutional: Negative.   Respiratory: Negative.   Cardiovascular: Negative.     Allergies  Review of patient's allergies indicates no known allergies.  Home Medications   Current Outpatient Rx  Name  Route  Sig  Dispense  Refill  . aspirin EC 81 MG tablet   Oral   Take 81 mg by mouth every morning.         . cephALEXin (KEFLEX) 500 MG capsule   Oral   Take 1 capsule (500 mg total) by mouth 2 (two) times daily.   10 capsule   0   . insulin aspart (NOVOLOG) 100 UNIT/ML injection   Subcutaneous   Inject 15-30 Units into the skin 3 (three) times daily before meals. Per sliding scale         . insulin glargine (LANTUS) 100 UNIT/ML injection   Subcutaneous   Inject 0.3 mLs (30 Units total) into the skin at bedtime.   10 mL   0   . ondansetron (ZOFRAN) 4 MG tablet   Oral   Take 1 tablet (4 mg total) by mouth every 8 (eight) hours as needed for nausea.   20 tablet   0   . oxyCODONE-acetaminophen (PERCOCET/ROXICET) 5-325 MG per tablet   Oral   Take 1 tablet by mouth every 6 (six) hours as needed for pain.   15 tablet   0   . oxyCODONE-acetaminophen (PERCOCET/ROXICET) 5-325 MG per tablet   Oral   Take 1 tablet by mouth every 4 (four) hours as needed for pain.   10 tablet   0   . pantoprazole (PROTONIX) 40 MG tablet   Oral   Take 1 tablet (40 mg total) by mouth daily.   30 tablet   0   . predniSONE (DELTASONE)  20 MG tablet   Oral   Take 2 tablets (40 mg total) by mouth daily.   10 tablet   0    BP 159/90  Pulse 78  Temp(Src) 98.5 F (36.9 C) (Oral)  Resp 18  Ht 5' (1.524 m)  Wt 162 lb (73.483 kg)  BMI 31.64 kg/m2  SpO2 100% Physical Exam  Nursing note and vitals reviewed. Constitutional: She is oriented to person, place, and time. She appears well-developed and well-nourished.  HENT:  Head: Normocephalic and atraumatic.  Eyes: Conjunctivae and EOM are normal.  Neck: Neck supple.  Cardiovascular: Normal rate and regular rhythm.   Pulmonary/Chest: Effort normal and breath sounds normal.  Abdominal: Soft. Bowel sounds are normal.  Musculoskeletal: Normal range of motion. She exhibits no tenderness.  Pt has full [id number] to do straight leg raise:good sensation  Neurological: She is alert and oriented to person, place, and time.  Skin: Skin is warm and dry.  Psychiatric: She has a normal mood and affect.    ED Course  Procedures (including critical care time) Labs Reviewed - No data  to display No results found. 1. Lumbar radiculopathy     MDM  Pt given something more for pain:no further imaging needed at this time:pt is asking for a note so that she can live down stairs:pt given follow up with DR. Jeanine Luz, NP 12/28/12 1549

## 2012-12-28 NOTE — ED Notes (Signed)
C/o left leg numbness since June 3rd-seen here on June 6th-report she still has pain and difficulty with ambulation-has been unable to get in to see PCP/[facility]

## 2012-12-29 NOTE — ED Provider Notes (Signed)
Medical screening examination/treatment/procedure(s) were performed by non-physician practitioner and as supervising physician I was immediately available for consultation/collaboration.   Malvin Johns, MD 12/29/12 [phone]

## 2013-01-09 ENCOUNTER — Ambulatory Visit: Payer: Managed Care, Other (non HMO) | Attending: Family Medicine | Admitting: Internal Medicine

## 2013-01-09 ENCOUNTER — Encounter: Payer: Self-pay | Admitting: Internal Medicine

## 2013-01-09 VITALS — BP 154/96 | HR 73 | Temp 98.7°F | Resp 16 | Ht 60.0 in | Wt 166.0 lb

## 2013-01-09 DIAGNOSIS — E1140 Type 2 diabetes mellitus with diabetic neuropathy, unspecified: Secondary | ICD-10-CM

## 2013-01-09 DIAGNOSIS — E1149 Type 2 diabetes mellitus with other diabetic neurological complication: Secondary | ICD-10-CM

## 2013-01-09 DIAGNOSIS — E1142 Type 2 diabetes mellitus with diabetic polyneuropathy: Secondary | ICD-10-CM

## 2013-01-09 DIAGNOSIS — E119 Type 2 diabetes mellitus without complications: Secondary | ICD-10-CM

## 2013-01-09 MED ORDER — OMEGA-3 FATTY ACIDS 1000 MG PO CAPS: 2.0000 g | ORAL_CAPSULE | Freq: Every day | ORAL | Status: DC

## 2013-01-09 MED ORDER — PREGABALIN 50 MG PO CAPS: 50.0000 mg | ORAL_CAPSULE | Freq: Three times a day (TID) | ORAL | Status: DC

## 2013-01-09 NOTE — Patient Instructions (Addendum)
Diabetic Neuropathy  Diabetic neuropathy is a common complication caused by diabetes. Neuropathy is a term that means nerve disease or damage. If your diabetes is uncontrolled and you have high blood glucose (sugar) levels, over time, this can lead to damage to nerves throughout your body. There are three types of diabetic neuropathy:    Peripheral.   Autonomic.   Focal.  PERIPHERAL NEUROPATHY  Peripheral neuropathy is the most common form of diabetic neuropathy. It causes damage to the nerves of the feet and legs and eventually the hands and arms.   SYMPTOMS   Peripheral neuropathy occurs slowly over time. The peripheral nerves sense touch, hot and cold, and pain. When these nerves no longer work:    Your feet become numb.   You can no longer feel pressure or pain in your feet.   You may have burning, stabbing or aching pain.  This can lead to:   Thick calluses over pressure areas.   Pressure sores.   Ulcers. Ulcers can become infected with germs (bacteria) and can even lead to infection in the bones of the feet.  DIAGNOSIS   The diagnosis of diabetic neuropathy is difficult at best. Sensory function testing can be done with:   Light touch using a monofilament.   Vibration with tuning fork.   Sharp sensation with pin prick  Other tests that can help diagnose neuropathy are:   Nerve Conduction Velocities (NCV). This checks the transmission of electrical current through a nerve.   Electromyography (EMG). This shows how muscles respond to electrical signals transmitted by nearby nerves.   Quantitative sensory testing, which is used to assess how your nerves respond to vibration and changes in temperature.  AUTONOMIC NEUROPATHY  The autonomic nervous system controls functions that you do not think about. Examples would be:    Heart beat.   Regulation of body temperature.   Blood pressure.   Urination.   Digestion.   Sweating.   Sexual function.  SYMPTOMS    The symptoms of autonomic neuropathy vary depending on which nerves are affected.    There can be problems with digestion such as:   Feeling sick to your stomach (nausea).   Vomiting.   Bloating.   Constipation.   Diarrhea.   Abdominal pain.   Difficulty with urination may occur because of the inability to sense when your bladder is full. You may have urine leakage (incontinence) or inability to empty your bladder completely (retention).   Palpitations or a feeling of an abnormal heart beat.   Blood pressure drops on arising (orthostatic hypotension). This can happen when you first sit up or stand up. It causes you to feel:   Dizzy.   Weak.   Faint.   Sexual functioning:   In men, inability to attain and maintain an erection.   In women, vaginal dryness and problems with decreased sexual desire and arousal.  DIAGNOSIS   Diagnosis is often based on reported symptoms. Tell your medical caregiver if you experience:    Dizziness.   Constipation.   Diarrhea.   Inappropriate urination or inability to urinate.   Inability to get or maintain an erection.  Tests that may be done include:   An EKG or Holter Monitor. These are tests that can help show problems with the heart rate or heart rhythm.   X-rays can be used to find if there are problems with your ability to properly empty food from your stomach into the small intestine after eating.  FOCAL   NEUROPATHY  Focal neuropathy affects just one nerve tract and occurs suddenly. However, it usually improves by itself over time. It does not cause long term damage, and treatments are usually needed only until the problem improves.  SYMPTOMS   Examples include:    Abnormal eye movements or abnormal alignment of both eyes.   Weakness in the wrist.   Foot drop, which results in inability to lift the foot properly. This causes abnormal walking or foot movement.  DIAGNOSIS    Diagnosis is made based on your symptoms and what your caregiver finds on your exam. Other tests that may be done include:   Nerve Conduction Velocities (NCV). This checks the transmission of electrical current through a nerve.   Electromyography (EMG). This shows how muscles respond to electrical signals transmitted by nearby nerves.   Quantitative sensory testing, which is used to assess how your nerves respond to vibration and changes in temperature.  TREATMENT  Once nerve damage occurs it cannot be reversed. The goal of treatment is to keep the disease from getting worse. If it gets worse, it will affect more nerve fibers. Controlling your blood (sugar) is the key. You will need to keep your blood glucose and A1c at the target range prescribed by your caregiver. Things that will help control blood glucose levels include:   Blood glucose monitoring.   Meal planning.   Physical activity.   Diabetes medication.  Over time, maintaining lower blood glucose levels helps lessen symptoms.  Sometimes, prescription pain medicine is needed. Focal neuropathy can be painful and unpredictable and occurs most often in older adults with diabetes.   SEEK MEDICAL CARE IF:    You develop peripheral nerve symptoms such as burning, numbness, or pain in your feet, legs or hands.   You develop autonomic nerve symptoms such as:   Dizziness.   Abnormal urinary control.   Inability to get an erection.   You develop focal nerve symptoms such as sudden abnormal eye movements or sudden foot drop.  Document Released: 08/30/2001 Document Revised: 09/13/2011 Document Reviewed: 11/29/2008  [employer] Patient Information 2014 [employer].

## 2013-01-09 NOTE — Progress Notes (Signed)
Patient ID: [name] [name], female   DOB: 05-20-1966, 47 y.o.   MRN: [id number]  CC: Followup for back pain and neuropathy  HPI: 47 year old female with past medical history of poorly controlled diabetes, on insulin, diabetic neuropathy and chronic back pain. The patient presents to clinic for followup. She reports her neuropathies in lower extremities have worsened over past few months or so. Patient reports no blurry vision or headache, no polydipsia or polyuria. She does have significant trouble walking and walks with a cane. No chest pain or palpitations or shortness of breath.  No Known Allergies Past Medical History  Diagnosis Date  . Diabetes mellitus    Current Outpatient Prescriptions on File Prior to Visit  Medication Sig Dispense Refill  . aspirin EC 81 MG tablet Take 81 mg by mouth every morning.      . cephALEXin (KEFLEX) 500 MG capsule Take 1 capsule (500 mg total) by mouth 2 (two) times daily.  10 capsule  0  . insulin aspart (NOVOLOG) 100 UNIT/ML injection Inject 15-30 Units into the skin 3 (three) times daily before meals. Per sliding scale      . insulin glargine (LANTUS) 100 UNIT/ML injection Inject 0.3 mLs (30 Units total) into the skin at bedtime.  10 mL  0  . ondansetron (ZOFRAN) 4 MG tablet Take 1 tablet (4 mg total) by mouth every 8 (eight) hours as needed for nausea.  20 tablet  0  . oxyCODONE-acetaminophen (PERCOCET/ROXICET) 5-325 MG per tablet Take 1 tablet by mouth every 4 (four) hours as needed for pain.  10 tablet  0  . pantoprazole (PROTONIX) 40 MG tablet Take 1 tablet (40 mg total) by mouth daily.  30 tablet  0   No current facility-administered medications on file prior to visit.   Family History  Problem Relation Age of Onset  . Diabetes Father   . Heart attack Father    History   Social History  . Marital Status: Legally Separated    Spouse Name: N/A    Number of Children: N/A  . Years of Education: N/A   Occupational History  . Not on file.    Social History Main Topics  . Smoking status: Never Smoker   . Smokeless tobacco: Never Used  . Alcohol Use: No  . Drug Use: No  . Sexually Active: Not on file   Other Topics Concern  . Not on file   Social History Narrative  . No narrative on file    Review of Systems  Constitutional: Negative for fever, chills, diaphoresis, activity change, appetite change and fatigue.  HENT: Negative for ear pain, nosebleeds, congestion, facial swelling, rhinorrhea, neck pain, neck stiffness and ear discharge.   Eyes: Negative for pain, discharge, redness, itching and visual disturbance.  Respiratory: Negative for cough, choking, chest tightness, shortness of breath, wheezing and stridor.   Cardiovascular: Negative for chest pain, palpitations and leg swelling.  Gastrointestinal: Negative for abdominal distention.  Genitourinary: Negative for dysuria, urgency, frequency, hematuria, flank pain, decreased urine volume, difficulty urinating and dyspareunia.  Musculoskeletal: positive for back pain,no joint swelling, arthralgias and gait problem.  Neurological: Negative for dizziness, tremors, seizures, syncope, facial asymmetry, speech difficulty, weakness, light-headedness, numbness and headaches.  Hematological: Negative for adenopathy. Does not bruise/bleed easily.  Psychiatric/Behavioral: Negative for hallucinations, behavioral problems, confusion, dysphoric mood, decreased concentration and agitation.    Objective:   Filed Vitals:   01/09/13 1236  BP: 154/96  Pulse: 73  Temp: 98.7 F (37.1 C)  Resp: 16    Physical Exam  Constitutional: Appears well-developed and well-nourished. No distress.  HENT: Normocephalic. External right and left ear normal. Oropharynx is clear and moist.  Eyes: Conjunctivae and EOM are normal. PERRLA, no scleral icterus.  Neck: Normal ROM. Neck supple. No JVD. No tracheal deviation. No thyromegaly.  CVS: RRR, S1/S2 +, no murmurs, no gallops, no carotid  bruit.  Pulmonary: Effort and breath sounds normal, no stridor, rhonchi, wheezes, rales.  Abdominal: Soft. BS +,  no distension, tenderness, rebound or guarding.  Musculoskeletal: Normal range of motion. Pain in lower extremities.  Lymphadenopathy: No lymphadenopathy noted, cervical, inguinal. Neuro: Alert. Normal reflexes, muscle tone coordination. No cranial nerve deficit. Skin: Skin is warm and dry. No rash noted. Not diaphoretic. No erythema. No pallor.  Psychiatric: Normal mood and affect. Behavior, judgment, thought content normal.   Lab Results  Component Value Date   WBC 5.9 10/03/2012   HGB 11.9* 10/03/2012   HCT 34.4* 10/03/2012   MCV 80.9 10/03/2012   PLT 157 10/03/2012   Lab Results  Component Value Date   CREATININE 0.71 10/03/2012   BUN 13 10/03/2012   NA 135 10/03/2012   K 3.5 10/03/2012   CL 102 10/03/2012   CO2 26 10/03/2012    Lab Results  Component Value Date   HGBA1C 16.8* 10/02/2012   Lipid Panel     Component Value Date/Time   CHOL 238* 10/03/2012 0852   TRIG 166* 10/03/2012 0852   HDL 48 10/03/2012 0852   CHOLHDL 5.0 10/03/2012 0852   VLDL 33 10/03/2012 0852   LDLCALC 157* 10/03/2012 0852       Assessment and plan:   Patient Active Problem List   Diagnosis Date Noted  . Diabetic neuropathy 01/09/2013    Priority: Medium - Prescription provided for Lyrica  - Referral provided for endocrine clinic   . DM (diabetes mellitus), type 2, uncontrolled 10/02/2012    Priority: Medium - check A1c and lipid panel today - Continue Lantus at bedtime and NovoLog 3 times a day, patient reports taking this more as a sliding scale  - Referral provided for endocrine clinic       Dyslipidemia - Fish oil omega-3 once a day, prescription provided

## 2013-01-09 NOTE — Progress Notes (Signed)
Pt here with c/o lower back pain radiating to both legs x 1 mnth. Pt was seen in ER [place] and prescribed steroids.states the numbness has subsided but shooting pain is getting worse.

## 2013-01-12 ENCOUNTER — Ambulatory Visit: Payer: Self-pay

## 2013-01-12 ENCOUNTER — Telehealth: Payer: Self-pay | Admitting: Family Medicine

## 2013-01-12 NOTE — Telephone Encounter (Signed)
Pt has concern about medication script received during 01/09/13 visit. Please f/u with patient.

## 2013-01-15 ENCOUNTER — Other Ambulatory Visit: Payer: Self-pay

## 2013-01-15 ENCOUNTER — Ambulatory Visit: Payer: Managed Care, Other (non HMO) | Attending: Family Medicine | Admitting: Internal Medicine

## 2013-01-15 VITALS — BP 137/88 | HR 71 | Temp 98.4°F | Resp 16 | Wt 166.6 lb

## 2013-01-15 DIAGNOSIS — G589 Mononeuropathy, unspecified: Secondary | ICD-10-CM

## 2013-01-15 DIAGNOSIS — Z09 Encounter for follow-up examination after completed treatment for conditions other than malignant neoplasm: Secondary | ICD-10-CM | POA: Insufficient documentation

## 2013-01-15 DIAGNOSIS — Z794 Long term (current) use of insulin: Secondary | ICD-10-CM | POA: Insufficient documentation

## 2013-01-15 DIAGNOSIS — IMO0001 Reserved for inherently not codable concepts without codable children: Secondary | ICD-10-CM

## 2013-01-15 DIAGNOSIS — E1142 Type 2 diabetes mellitus with diabetic polyneuropathy: Secondary | ICD-10-CM | POA: Insufficient documentation

## 2013-01-15 DIAGNOSIS — Z79899 Other long term (current) drug therapy: Secondary | ICD-10-CM | POA: Insufficient documentation

## 2013-01-15 DIAGNOSIS — G629 Polyneuropathy, unspecified: Secondary | ICD-10-CM

## 2013-01-15 DIAGNOSIS — E1165 Type 2 diabetes mellitus with hyperglycemia: Secondary | ICD-10-CM

## 2013-01-15 DIAGNOSIS — E1149 Type 2 diabetes mellitus with other diabetic neurological complication: Secondary | ICD-10-CM | POA: Insufficient documentation

## 2013-01-15 MED ORDER — PREGABALIN 50 MG PO CAPS: 50.0000 mg | ORAL_CAPSULE | Freq: Three times a day (TID) | ORAL | Status: DC

## 2013-01-15 MED ORDER — GABAPENTIN 100 MG PO CAPS: 100.0000 mg | ORAL_CAPSULE | Freq: Three times a day (TID) | ORAL | Status: DC

## 2013-01-15 NOTE — Progress Notes (Signed)
Patient ID: LATARIA COLLEN, female   DOB: [birth year], 47 y.o.   MRN: [id number] Patient Demographics  Nyriah Sether, is a 46 y.o. female  [id number]  [id number]  DOB - 05-10-1966  Chief Complaint  Patient presents with  . Follow-up        Subjective:   Zellah Blansett today is here a Medication issue, she was given Lyrica last visit, however she could not afford, she now wishes to be changed to Neurontin. She has no other complaints.  Patient has No headache, No chest pain, No abdominal pain - No Nausea, No new weakness tingling or numbness, No Cough - SOB.   Objective:    Filed Vitals:   01/15/13 1519  BP: 137/88  Pulse: 71  Temp: 98.4 F (36.9 C)  Resp: 16  Weight: 166 lb 9.6 oz (75.569 kg)  SpO2: 96%     ALLERGIES:  No Known Allergies  PAST MEDICAL HISTORY: Past Medical History  Diagnosis Date  . Diabetes mellitus     MEDICATIONS AT HOME: Prior to Admission medications   Medication Sig Start Date End Date Taking? Authorizing Provider  aspirin EC 81 MG tablet Take 81 mg by mouth every morning.    Historical Provider, MD  cephALEXin (KEFLEX) 500 MG capsule Take 1 capsule (500 mg total) by mouth 2 (two) times daily. 10/05/12   Hosie Poisson, MD  fish oil-omega-3 fatty acids 1000 MG capsule Take 2 capsules (2 g total) by mouth daily. 01/09/13   Robbie Lis, MD  gabapentin (NEURONTIN) 100 MG capsule Take 1 capsule (100 mg total) by mouth 3 (three) times daily. 01/15/13   Shanker Kristeen Mans, MD  insulin aspart (NOVOLOG) 100 UNIT/ML injection Inject 15-30 Units into the skin 3 (three) times daily before meals. Per sliding scale    Historical Provider, MD  insulin glargine (LANTUS) 100 UNIT/ML injection Inject 0.3 mLs (30 Units total) into the skin at bedtime. 10/04/12   Hosie Poisson, MD  ondansetron (ZOFRAN) 4 MG tablet Take 1 tablet (4 mg total) by mouth every 8 (eight) hours as needed for nausea. 10/04/12   Hosie Poisson, MD  oxyCODONE-acetaminophen (PERCOCET/ROXICET) 5-325 MG per  tablet Take 1 tablet by mouth every 4 (four) hours as needed for pain. 12/28/12   Glendell Docker, NP  pantoprazole (PROTONIX) 40 MG tablet Take 1 tablet (40 mg total) by mouth daily. 10/04/12   Hosie Poisson, MD  pregabalin (LYRICA) 50 MG capsule Take 1 capsule (50 mg total) by mouth 3 (three) times daily. 01/15/13   Shanker Kristeen Mans, MD     Exam  General appearance :Awake, alert, not in any distress. Speech Clear. Not toxic Looking HEENT: Atraumatic and Normocephalic, pupils equally reactive to light and accomodation Neck: supple, no JVD. No cervical lymphadenopathy.  Chest:Good air entry bilaterally, no added sounds  CVS: S1 S2 regular, no murmurs.  Abdomen: Bowel sounds present, Non tender and not distended with no gaurding, rigidity or rebound. Extremities: B/L Lower Ext shows no edema, both legs are warm to touch Neurology: Awake alert, and oriented X 3, CN II-XII intact, Non focal Skin:No Rash Wounds:N/A    Data Review   CBC No results found for this basename: WBC, HGB, HCT, PLT, MCV, MCH, MCHC, RDW, NEUTRABS, LYMPHSABS, MONOABS, EOSABS, BASOSABS, BANDABS, BANDSABD,  in the last 168 hours  Chemistries   No results found for this basename: NA, K, CL, CO2, GLUCOSE, BUN, CREATININE, GFRCGP, CALCIUM, MG, AST, ALT, ALKPHOS, BILITOT,  in the last 168 hours ------------------------------------------------------------------------------------------------------------------  No results found for this basename: HGBA1C,  in the last 72 hours ------------------------------------------------------------------------------------------------------------------ No results found for this basename: CHOL, HDL, LDLCALC, TRIG, CHOLHDL, LDLDIRECT,  in the last 72 hours ------------------------------------------------------------------------------------------------------------------ No results found for this basename: TSH, T4TOTAL, FREET3, T3FREE, THYROIDAB,  in the last 72  hours ------------------------------------------------------------------------------------------------------------------ No results found for this basename: VITAMINB12, FOLATE, FERRITIN, TIBC, IRON, RETICCTPCT,  in the last 72 hours  Coagulation profile  No results found for this basename: INR, PROTIME,  in the last 168 hours    Assessment & Plan   Diabetic Neuropathy -patient given a prescription for Lyrica last visit, however not able to fill it because of financial concerns. She is here to see if we can discharge her over to Neurontin. We'll start her on Neurontin 100 mg 3 times a day. Social worker here at the clinic, will try to contact patient's assistance program to see if he can get Lyrica as well. Once Lyrica is available, we can stop Neurontin.  Uncontrolled diabetes - Last A1c 16.8-with a repeat A1c prior to next visit - Continue with current dosing off insulin for now  followup in one month-for the health maintenance will be done during subsequent visits.

## 2013-01-15 NOTE — Progress Notes (Signed)
Patient here for medication change Want  To switch from lyrica to gabapentin

## 2013-01-15 NOTE — Telephone Encounter (Signed)
Patient has questions on his medications Please call

## 2013-01-17 ENCOUNTER — Encounter: Payer: Self-pay | Admitting: Physical Medicine & Rehabilitation

## 2013-02-02 ENCOUNTER — Ambulatory Visit (INDEPENDENT_AMBULATORY_CARE_PROVIDER_SITE_OTHER): Payer: Managed Care, Other (non HMO) | Admitting: Internal Medicine

## 2013-02-02 ENCOUNTER — Encounter: Payer: Self-pay | Admitting: Internal Medicine

## 2013-02-02 VITALS — BP 122/68 | HR 75 | Temp 98.7°F | Resp 12 | Ht 62.25 in | Wt 164.0 lb

## 2013-02-02 DIAGNOSIS — E1165 Type 2 diabetes mellitus with hyperglycemia: Secondary | ICD-10-CM

## 2013-02-02 DIAGNOSIS — IMO0001 Reserved for inherently not codable concepts without codable children: Secondary | ICD-10-CM

## 2013-02-02 DIAGNOSIS — E1140 Type 2 diabetes mellitus with diabetic neuropathy, unspecified: Secondary | ICD-10-CM

## 2013-02-02 DIAGNOSIS — E1149 Type 2 diabetes mellitus with other diabetic neurological complication: Secondary | ICD-10-CM

## 2013-02-02 DIAGNOSIS — E1142 Type 2 diabetes mellitus with diabetic polyneuropathy: Secondary | ICD-10-CM

## 2013-02-02 MED ORDER — INSULIN ASPART 100 UNIT/ML ~~LOC~~ SOLN: 25.0000 [IU] | Freq: Three times a day (TID) | SUBCUTANEOUS | Status: DC

## 2013-02-02 MED ORDER — INSULIN GLARGINE 100 UNIT/ML ~~LOC~~ SOLN: 40.0000 [IU] | Freq: Every day | SUBCUTANEOUS | Status: DC

## 2013-02-02 NOTE — Progress Notes (Signed)
Patient ID: [name], female   DOB: 1966-04-16, 47 y.o.   MRN: [id number]  HPI: [name] [name] is a 47 y.o.-year-old female, referred by Dr. Charlies Silvers at the [facility], for management of DM2, insulin-dependent, uncontrolled, with complications (PN). Patient is also seen by Dr. Irwin Brakeman.  Patient has been diagnosed with diabetes in 1990; she started insulin around the same time. Last hemoglobin A1c was: Lab Results  Component Value Date   HGBA1C 16.8* 10/02/2012  previous values have been 13.8 on 11/2009, and 16.4 on 10/2009.  Pt is on a regimen of: - Lantus 30 units qhs - Novolog 15 units tid ac, based on a sliding scale: > 200: adds 10 units > 300: adds 20 units She tells me she does not miss doses. She tried Metformin before >> nauseated (was taking it with meal).  She has pens for both insulins. Has difficulty affording the insulins, was mostly relying on samples.  Pt checks her sugars 3-4x a day and they are usually > 200s - no log.  - am: 235-low 300s - before lunch: 300s - before dinner: 300s-400s - bedtime: not usually checking No lows. Lowest sugar was 235, in last 4 months; she has hypoglycemia awareness at 200 >> HA. Highest sugar was HI.  Pt's meals are: - Breakfast: chicken sandwich + sweet tea or water - Lunch: lean Cuisine or leftovers: baked chicken + green beans - Dinner: meat + rice/potatoes + salad  - Snacks: popcorn  Drinks water "all day, every day", sometimes sweet tea or gatorade Works 7am to 7 pm, 2-3x a week. She eats a late dinner.   She has increased thirst, urination (better lately) and also has blurry vision.  - no CKD, last BUN/creatinine:  Lab Results  Component Value Date   BUN 13 10/03/2012   CREATININE 0.71 10/03/2012   - last set of lipids: Lab Results  Component Value Date   CHOL 238* 10/03/2012   HDL 48 10/03/2012   LDLCALC 157* 10/03/2012   TRIG 166* 10/03/2012   CHOLHDL 5.0 10/03/2012  On Fish oil daily.  - had an eye exam was in  2013 (a year ago) and then had a new exam in 09/2012. No DR.  - + numbness and tingling in her feet, has peripheral neuropathy, has been tried on Lyrica, however she could not afford this, and had to switch to Neurontin. She did try Lyrica and it worked very well, but could not afford more.   I reviewed her chart and she also has a history of mild mitral regurgitation, hypertension, hyperlipidemia -- on fish oil, GERD-on Protonix, chronic back pain.  Pt has FH of DM in PGM, father, 2 sisters, and 1 brother.  ROS: Constitutional: no weight gain/loss, no fatigue, no subjective hyperthermia/hypothermia Eyes: + blurry vision, no xerophthalmia ENT: no sore throat, no nodules palpated in throat, no dysphagia/odynophagia, no hoarseness; + ringing in ears Cardiovascular: no CP/SOB/palpitations/+ leg swelling Respiratory: no cough/SOB Gastrointestinal: no N/V/D/C Musculoskeletal: no muscle/joint aches Skin: no rashes Neurological: no tremors/numbness/tingling/dizziness Psychiatric: no depression/anxiety  Past Medical History  Diagnosis Date  . Diabetes mellitus    Past Surgical History  Procedure Laterality Date  . Abdominal hysterectomy    . Tubal ligation    . Tonsillectomy    . Cesarean section    . Ganglion cyst excision    . Cholecystectomy     History   Social History  . Marital Status: Legally Separated    Spouse Name:  N/A    Number of Children: 3   Occupational History  . Glass blower/designer   Social History Main Topics  . Smoking status: Never Smoker   . Smokeless tobacco: Never Used  . Alcohol Use: No  . Drug Use: No   Current Outpatient Prescriptions on File Prior to Visit  Medication Sig Dispense Refill  . aspirin EC 81 MG tablet Take 81 mg by mouth every morning.      . fish oil-omega-3 fatty acids 1000 MG capsule Take 2 capsules (2 g total) by mouth daily.  30 capsule  3  . gabapentin (NEURONTIN) 100 MG capsule Take 1 capsule (100 mg total) by mouth 3 (three)  times daily.  90 capsule  3  . cephALEXin (KEFLEX) 500 MG capsule Take 1 capsule (500 mg total) by mouth 2 (two) times daily.  10 capsule  0  . ondansetron (ZOFRAN) 4 MG tablet Take 1 tablet (4 mg total) by mouth every 8 (eight) hours as needed for nausea.  20 tablet  0  . oxyCODONE-acetaminophen (PERCOCET/ROXICET) 5-325 MG per tablet Take 1 tablet by mouth every 4 (four) hours as needed for pain.  10 tablet  0  . pantoprazole (PROTONIX) 40 MG tablet Take 1 tablet (40 mg total) by mouth daily.  30 tablet  0  . pregabalin (LYRICA) 50 MG capsule Take 1 capsule (50 mg total) by mouth 3 (three) times daily.  90 capsule  3   No current facility-administered medications on file prior to visit.   No Known Allergies  Family History  Problem Relation Age of Onset  . Diabetes Father   . Heart attack Father    PE: BP 122/68  Pulse 75  Temp(Src) 98.7 F (37.1 C) (Oral)  Resp 12  Ht 5' 2.25" (1.581 m)  Wt 164 lb (74.39 kg)  BMI 29.76 kg/m2  SpO2 97% Wt Readings from Last 3 Encounters:  02/02/13 164 lb (74.39 kg)  01/15/13 166 lb 9.6 oz (75.569 kg)  01/09/13 166 lb (75.297 kg)   Constitutional: overweight, in NAD - uses a cane 2/2 neuropathy Eyes: PERRLA, EOMI, no exophthalmos ENT: moist mucous membranes, no thyromegaly, no cervical lymphadenopathy Cardiovascular: + 2 SEM, RRR Respiratory: CTA B Gastrointestinal: abdomen soft, NT, ND, BS+ Musculoskeletal: no deformities, strength intact in all 4 Skin: moist, warm, no rashes Neurological: no tremor with outstretched hands, DTR decreased in lower extremities  ASSESSMENT: 1. DM2, insulin-dependent, uncontrolled, with complications - Peripheral neuropathy  PLAN:  1. Patient with long-standing, very uncontrolled diabetes, basal-bolus insulin.  - We discussed about options for treatment, and I suggested to change the insulin regimen in the following way:  Please change the insulin regimen as follows: - increase Lantus to 40 units at  night - increase Novolog to 20 units 3 x a day, 15 min before your meals - add the following Novolog correction (sliding scale): - 150- 160: + 1 unit - 161- 170: + 2 units - 171- 180: + 3 units - 181- 190: + 4 units - 191- 200: + 5 units - 201- 210: + 6 units - 211- 220: + 7 units - 221- 230: + 8 units - 231- 240: + 9 units - 241- 250: + 10 units - 251- 260: + 11 units - 261- 270: + 12 units - 271- 280: + 13 units - 281- 290: + 14 units >290: + 15 units  - Strongly advised her to start checking her sugars at different times of the day -  check at least 3 times a day, rotating checks - I stressed compliance with her insulin regimen - given her samples of Lantus and NovoLog pens, and I called vials to her pharmacy to help with the cost. At next visit, we need to think about maybe switching to a less expensive insulin. We discussed about this, but for now she opted to try the vials. 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 She tells me that the co-pay that she had to pay today, $140 is too high for her, and she is not sure whether she can return to see me. She tells me that "I need to be coming back", but financially I am not sure whether she can afford this.  - she is very interested in nutrition advice, however, could not  afford to go see the diabetes educators. Since she is interested in a plant-based diet, I gave her reference materials (see patient instruction section).

## 2013-02-02 NOTE — Patient Instructions (Addendum)
Please return in 1 month with your sugar log.   Please change the insulin regimen as follows: - increase Lantus to 40 units at night - increase Novolog to 20 units 3 x a day, 15 min before your meals - add the following Novolog correction (sliding scale): - 150- 160: + 1 unit - 161- 170: + 2 units - 171- 180: + 3 units - 181- 190: + 4 units - 191- 200: + 5 units - 201- 210: + 6 units - 211- 220: + 7 units - 221- 230: + 8 units - 231- 240: + 9 units - 241- 250: + 10 units - 251- 260: + 11 units - 261- 270: + 12 units - 271- 280: + 13 units - 281- 290: + 14 units >290: + 15 units  - please check your sugars more often after you start using the above regimen, to make sure they do not drop. - Please send me a message to my chart if the sugars drop lower than 70 or higher than 300.  Please consider the following ways to cut down carbs and fat and increase fiber and micronutrients in your diet: - substitute whole grain for white bread or pasta - substitute brown rice for white rice - substitute 90-calorie flat bread pieces for slices of bread when possible - substitute sweet potatoes or yams for white potatoes - substitute humus for margarine - substitute tofu for cheese when possible - substitute almond or rice milk for regular milk (would not drink soy milk daily due to concern for soy estrogen influence on breast cancer risk) - substitute dark chocolate for other sweets when possible - substitute water - can add lemon or orange slices for taste - for diet sodas (artificial sweeteners will trick your body that you can eat sweets without getting calories and will lead you to overeating and weight gain in the long run) - do not skip breakfast or other meals (this will slow down the metabolism and will result in more weight gain over time)  - can try smoothies made from fruit and almond/rice milk in am instead of regular breakfast - can also try old-fashioned (not instant) oatmeal made  with almond/rice milk in am - order the dressing on the side when eating salad at a restaurant (pour less than half of the dressing on the salad) - eat as little meat as possible - can try juicing, but should not forget that juicing will get rid of the fiber, so would alternate with eating raw veg./fruits or drinking smoothies - use as little oil as possible, even when using olive oil - can dress a salad with a mix of balsamic vinegar and lemon juice, for e.g. - use agave nectar, stevia sugar, or regular sugar rather than artificial sweateners - steam or broil/roast veggies  - snack on veggies/fruit/nuts (unsalted, preferably) when possible, rather than processed foods - reduce or eliminate aspartame in diet (it is in diet sodas, chewing gum, etc) Read the labels!  Plant-based diet materials:  - Lectures (you tube):  Alyssa Grove: "Breaking the Food Seduction"  Doug Lisle: "How to Lose Weight, without Losing Your Mind"  Shari Heritage: "What is Insulin Resistance" https://www.woods-mathews.com/ - Documentaries:  [place] [employer], Sick and Nearly Dead  [employer] - Books:  Alyssa Grove: "Program for Reversing Diabetes"  Heath Gold: "The [place] Study"  Norma Fredrickson: "Supermarket Vegan" (cookbook) - Facebook pages:  [place] [employer] [place] [employer] - Healthy nutrition info websites:  https://www.martin.info/   PATIENT INSTRUCTIONS FOR TYPE 2 DIABETES:  DIET AND EXERCISE Diet and exercise is an important part of diabetic treatment.  We recommended aerobic exercise in the form of brisk walking (working between 40-60% of maximal aerobic capacity, similar to brisk walking) for 150 minutes per week (such as 30 minutes five days per week) along with 3 times per week performing 'resistance' training (using various gauge rubber tubes with handles) 5-10 exercises involving  the major muscle groups (upper body, lower body and core) performing 10-15 repetitions (or near fatigue) each exercise. Start at half the above goal but build slowly to reach the above goals. If limited by weight, joint pain, or disability, we recommend daily walking in a swimming pool with water up to waist to reduce pressure from joints while allow for adequate exercise.    BLOOD GLUCOSES Monitoring your blood glucoses is important for continued management of your diabetes. Please check your blood glucoses 2-4 times a day: fasting, before meals and at bedtime (you can rotate these measurements - e.g. one day check before the 3 meals, the next day check before 2 of the meals and before bedtime, etc.   HYPOGLYCEMIA (low blood sugar) Hypoglycemia is usually a reaction to not eating, exercising, or taking too much insulin/ other diabetes drugs.  Symptoms include tremors, sweating, hunger, confusion, headache, etc. Treat IMMEDIATELY with 15 grams of Carbs:   4 glucose tablets    cup regular juice/soda   2 tablespoons raisins   4 teaspoons sugar   1 tablespoon honey Recheck blood glucose in 15 mins and repeat above if still symptomatic/blood glucose <100. Please contact our office at [phone] if you have questions about how to next handle your insulin.  RECOMMENDATIONS TO REDUCE YOUR RISK OF DIABETIC COMPLICATIONS: * Take your prescribed MEDICATION(S). * Follow a DIABETIC diet: Complex carbs, fiber rich foods, heart healthy fish twice weekly, (monounsaturated and polyunsaturated) fats * AVOID saturated/trans fats, high fat foods, >2,300 mg salt per day. * EXERCISE at least 5 times a week for 30 minutes or preferably daily.  * DO NOT SMOKE OR DRINK more than 1 drink a day. * Check your FEET every day. Do not wear tightfitting shoes. Contact us if you develop an ulcer * See your EYE doctor once a year or more if needed * Get a FLU shot once a year * Get a PNEUMONIA vaccine once before and  once after age 34 years  GOALS:  * Your Hemoglobin A1c of <7%  * fasting sugars need to be <130 * after meals sugars need to be <180 (2h after you start eating) * Your Systolic BP should be [id number] or lower  * Your Diastolic BP should be 80 or lower  * Your HDL (Good Cholesterol) should be 40 or higher  * Your LDL (Bad Cholesterol) should be 100 or lower  * Your Triglycerides should be 150 or lower  * Your Urine microalbumin (kidney function) should be <30 * Your Body Mass Index should be 25 or lower   We will be glad to help you achieve these goals. Our telephone number is: [phone].

## 2013-02-16 ENCOUNTER — Encounter (HOSPITAL_COMMUNITY): Payer: Self-pay | Admitting: Emergency Medicine

## 2013-02-16 ENCOUNTER — Emergency Department (INDEPENDENT_AMBULATORY_CARE_PROVIDER_SITE_OTHER)
Admission: EM | Admit: 2013-02-16 | Discharge: 2013-02-16 | Disposition: A | Discharge status: Home / Self Care | Payer: Managed Care, Other (non HMO) | Source: Home / Self Care

## 2013-02-16 DIAGNOSIS — L03039 Cellulitis of unspecified toe: Secondary | ICD-10-CM

## 2013-02-16 DIAGNOSIS — L03031 Cellulitis of right toe: Secondary | ICD-10-CM

## 2013-02-16 MED ORDER — MUPIROCIN 2 % EX OINT: TOPICAL_OINTMENT | Freq: Three times a day (TID) | CUTANEOUS | Status: DC

## 2013-02-16 MED ORDER — CEPHALEXIN 500 MG PO CAPS: 500.0000 mg | ORAL_CAPSULE | Freq: Three times a day (TID) | ORAL | Status: DC

## 2013-02-16 NOTE — ED Provider Notes (Signed)
Chief Complaint:   Chief Complaint  Patient presents with  . Foot Pain    History of Present Illness:   [name] [name] is a 47 year old type II diabetic female who has had soreness of the tip of the right second toe since last night. She denies any trauma to the toe. It hurts with any pressure on it. There's been no drainage. She denies any fever. There is no swelling of the foot. She's never had anything like this before. She has diabetic neuropathy and decreased sensation in her feet and toes.  Review of Systems:  Other than noted above, the patient denies any of the following symptoms: Systemic:  No fevers, chills, or sweats.  No fatigue or tiredness. Musculoskeletal:  No joint pain, arthritis, bursitis, swelling, or back pain.  Neurological:  No muscular weakness, paresthesias.  [place]:  Past medical history, family history, social history, meds, and allergies were reviewed.  No history of gout.   She is a type II diabetic and takes Lantus insulin, NovoLog, aspirin, and Neurontin.  Physical Exam:   Vital signs:  BP 136/94  Pulse 74  Temp(Src) 98 F (36.7 C) (Oral)  Resp 18  SpO2 100% Gen:  Alert and oriented times 3.  In no distress. Musculoskeletal:  Exam of the foot reveals there is erythema, swelling, and pain to palpation of the medial and lateral nail fold of the right second toe. There is some discoloration under the toenail. There is no necrosis. The tip of the toe is pink and warm and has immediate capillary refill. There is no purulent drainage or ulceration.  Otherwise, all joints had a full a ROM with no swelling, bruising or deformity.  No edema, pulses full. Extremities were warm and pink.  Capillary refill was brisk.  Skin:  Clear, warm and dry.  No rash. Neuro:  Alert and oriented times 3.  Muscle strength was normal.  Sensation was intact to light touch.   Course in Urgent Care Center:   Given a postoperative shoe.  Assessment:  The encounter diagnosis was Paronychia of  second toe, right.  No evidence of ischemia or ulceration.  Plan:   1.  The following meds were prescribed:   Discharge Medication List as of 02/16/2013  5:39 PM    START taking these medications   Details  !! cephALEXin (KEFLEX) 500 MG capsule Take 1 capsule (500 mg total) by mouth 3 (three) times daily., Starting 02/16/2013, Until Discontinued, Normal    mupirocin ointment (BACTROBAN) 2 % Apply topically 3 (three) times daily., Starting 02/16/2013, Until Discontinued, Normal     !! - Potential duplicate medications found. Please discuss with provider.     2.  The patient was instructed in symptomatic care, including rest and activity, elevation, and daily soak in warm water.  Appropriate handouts were given. 3.  The patient was told to return if becoming worse in any way, if no better in 3 or 4 days, and given some red flag symptoms such as worsening of pain or progression of the erythema up on the foot that would indicate earlier return.   4.  The patient was told to follow up with her primary care physician next week.      Harden Mo, MD 02/16/13 2221

## 2013-02-16 NOTE — ED Notes (Signed)
Infection of third toe of right foot. Pain started yesterday. Pt has not tried any otc treatments for symptoms.  Denies injury.

## 2013-02-16 NOTE — ED Notes (Signed)
Waiting discharge papers 

## 2013-02-20 ENCOUNTER — Encounter: Payer: Self-pay | Admitting: Physical Medicine & Rehabilitation

## 2013-02-20 ENCOUNTER — Encounter
Payer: Managed Care, Other (non HMO) | Attending: Physical Medicine & Rehabilitation | Admitting: Physical Medicine & Rehabilitation

## 2013-02-20 ENCOUNTER — Ambulatory Visit: Payer: Self-pay | Admitting: Physical Medicine & Rehabilitation

## 2013-02-20 VITALS — BP 135/90 | HR 74 | Resp 16 | Ht 60.0 in | Wt 160.0 lb

## 2013-02-20 DIAGNOSIS — E669 Obesity, unspecified: Secondary | ICD-10-CM | POA: Insufficient documentation

## 2013-02-20 DIAGNOSIS — E1142 Type 2 diabetes mellitus with diabetic polyneuropathy: Secondary | ICD-10-CM

## 2013-02-20 DIAGNOSIS — E1149 Type 2 diabetes mellitus with other diabetic neurological complication: Secondary | ICD-10-CM

## 2013-02-20 DIAGNOSIS — M545 Low back pain, unspecified: Secondary | ICD-10-CM | POA: Insufficient documentation

## 2013-02-20 DIAGNOSIS — E1140 Type 2 diabetes mellitus with diabetic neuropathy, unspecified: Secondary | ICD-10-CM

## 2013-02-20 DIAGNOSIS — Z5181 Encounter for therapeutic drug level monitoring: Secondary | ICD-10-CM

## 2013-02-20 DIAGNOSIS — E1165 Type 2 diabetes mellitus with hyperglycemia: Secondary | ICD-10-CM

## 2013-02-20 DIAGNOSIS — M549 Dorsalgia, unspecified: Secondary | ICD-10-CM

## 2013-02-20 DIAGNOSIS — R269 Unspecified abnormalities of gait and mobility: Secondary | ICD-10-CM | POA: Insufficient documentation

## 2013-02-20 DIAGNOSIS — M47817 Spondylosis without myelopathy or radiculopathy, lumbosacral region: Secondary | ICD-10-CM | POA: Insufficient documentation

## 2013-02-20 DIAGNOSIS — IMO0001 Reserved for inherently not codable concepts without codable children: Secondary | ICD-10-CM

## 2013-02-20 DIAGNOSIS — Z79899 Other long term (current) drug therapy: Secondary | ICD-10-CM

## 2013-02-20 MED ORDER — GABAPENTIN 300 MG PO CAPS: 300.0000 mg | ORAL_CAPSULE | Freq: Three times a day (TID) | ORAL | Status: DC

## 2013-02-20 NOTE — Progress Notes (Signed)
Subjective:    Patient ID: [name] [name], female    DOB: 05-28-1966, 47 y.o.   MRN: [id number]  HPI  This is a pleasant AA female with a history of diabetic peripheral neuropathy who developed shooting pains in her lower legs and feet starting in March of this year. Over the last few weeks the pain has progressed to where it's shooting down from her thighs. Last month she remembers waking up with numbness on her left leg which did not resolve upon getting up. It lasted a few days but improved. She still reports some weakness in this leg which has affected balance. She reports that she stumbles a lot and in fact fell about 3 weeks ago when the left leg gave way.   [name] [name] was diagnosed with diabetes in 2000. She has been on insulin off and onsince 2000 as well. Her sugars have been out of control. A Hgb A1C from March was 16.8!   For pain she is on gabapentin 100mg  which she may take one TID to three TID. She has also been rx'ed percocet for pain which she doesn't use much of because it makes her sleepy.   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 Her hours are typically 7am to 7pm 3-4 days per week. She is on her feet the entire time. She frequently has to lift reels to place on the machine which weigh 5-8lbs depending on whether they are full or not. She sometimes has to operate two machines at a time.   Pain Inventory Average Pain 9 Pain Right Now 9 My pain is sharp, burning, stabbing and tingling  In the last 24 hours, has pain interfered with the following? General activity 7 Relation with others 7 Enjoyment of life 9 What TIME of day is your pain at its worst? evening Sleep (in general) Poor  Pain is worse with: bending, sitting, inactivity and some activites Pain improves with: medication Relief from Meds: 5  Mobility use a cane ability to climb steps?  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   Family History  Problem Relation Age of Onset  . Diabetes Father   . Heart attack Father    History   Social History  . Marital Status: Legally Separated    Spouse Name: N/A    Number of Children: N/A  . Years of Education: N/A   Social History Main Topics  . Smoking status: Never Smoker   . Smokeless tobacco: Never Used  . Alcohol Use: No  . Drug Use: No  . Sexual Activity: Yes   Other Topics Concern  . None   Social History Narrative  . None   Past Surgical History  Procedure Laterality Date  . Abdominal hysterectomy    . Tubal ligation    . Tonsillectomy    . Cesarean section    . Ganglion cyst excision    . Cholecystectomy     Past Medical History  Diagnosis Date  . Diabetes mellitus    BP 135/90  Pulse 74  Resp 16  Ht 5' (1.524 m)  Wt 160 lb (72.576 kg)  BMI 31.25 kg/m2  SpO2 99%     Review of Systems  Cardiovascular: Positive for leg swelling.  Musculoskeletal: Positive for back pain and gait problem.  Neurological: Positive for weakness and numbness.  Tingling  All other systems reviewed and are negative.       Objective:   Physical Exam   General: Alert and oriented x 3, No apparent distress. Abdominally obese. HEENT: Head is normocephalic, atraumatic, PERRLA, EOMI, sclera anicteric, oral mucosa pink and moist, dentition intact, ext ear canals clear,  Neck: Supple without JVD or lymphadenopathy Heart: Reg rate and rhythm. No murmurs rubs or gallops Chest: CTA bilaterally without wheezes, rales, or rhonchi; no distress Abdomen: Soft, non-tender, non-distended, bowel sounds positive. Extremities: No clubbing, cyanosis, or edema. Pulses are 2+ Skin: Clean and intact without signs of breakdown Neuro: Pt is cognitively appropriate with normal insight, memory, and awareness. Cranial nerves 2-12 are intact. Sensory exam is 1/2 to temperature and LT  from knees to ankle and trace/2 at the feet. She also has mild sensory loss at the finger tips bilaterally.  Reflexes are 1+ in UE's and trace/absent in the lowers. Fine motor coordination is intact. No tremors. Motor function is grossly 5/5 in the uppers. She is 3+ to 4/5 in the proximal LE's to 3+ distally----there was a large pain inhibition component.  Musculoskeletal: she was hypersensitive to touch in the legs bilaterally below the knees. Low back was somewhat tender with palpation. She was tight in the trunk and both hamstrings with stretching. Flexion caused mild pain but she had more discomfort with extension of the back. She walked with a wide based gait but did not have frank antalgia. Psych: Pt's affect is appropriate. Pt is cooperative        Assessment & Plan:  1. Uncontrolled diabetes with neuropathy 2. Low back pain which appears to be generalized spondylosis, mild facet arthropathy. Her DDD is mild on recent back xray. Symptoms are exacerbated by tight core muscles and prolonged standing while at work. 3. Mild obesity.  4. Gait disorder due to the above.   Plan: 1. Titrate gabapentin to 300mg  tid 2. Advised naproxen OTC 220mg  bid with food for the next few weeks (I would not schedule it long term however.) 3. Discussed the importance of better control of her diabetes. She is very young to be having these complications already and needs to be more responsible about her management 4. Provided the patient with extensive Pilates based exercises and stretches addressing posture and her low back and pelvis. She needs to think about posture, stretching, and rest breaks while she works. 12 hours is a long time to stand! 5. Follow up with me in 6 weeks. 30 minutes of face to face patient care time were spent during this visit. All questions were encouraged and answered.

## 2013-02-20 NOTE — Patient Instructions (Signed)
PLEASE REVIEW EXERCISES I GAVE YOU. YOU NEED TO WORK ON POSTURE AND RANGE OF MOTION EVERY DAY   TRY NAPROXEN (ALLEVE) 220MG , ONE EVERY 12 HOURS WITH FOOD.

## 2013-02-22 ENCOUNTER — Ambulatory Visit: Payer: Self-pay

## 2013-02-26 ENCOUNTER — Encounter: Payer: Self-pay | Admitting: Physical Medicine and Rehabilitation

## 2013-03-06 ENCOUNTER — Ambulatory Visit: Payer: Self-pay | Admitting: Internal Medicine

## 2013-03-06 DIAGNOSIS — Z0289 Encounter for other administrative examinations: Secondary | ICD-10-CM

## 2013-03-09 ENCOUNTER — Ambulatory Visit: Payer: Managed Care, Other (non HMO) | Attending: Internal Medicine

## 2013-03-23 ENCOUNTER — Ambulatory Visit: Payer: Self-pay

## 2013-03-27 ENCOUNTER — Ambulatory Visit: Payer: No Typology Code available for payment source | Attending: Internal Medicine | Admitting: Internal Medicine

## 2013-03-27 ENCOUNTER — Encounter: Payer: Self-pay | Admitting: Internal Medicine

## 2013-03-27 VITALS — BP 127/89 | HR 63 | Temp 98.5°F | Resp 16 | Ht 60.0 in | Wt 165.0 lb

## 2013-03-27 DIAGNOSIS — G629 Polyneuropathy, unspecified: Secondary | ICD-10-CM

## 2013-03-27 DIAGNOSIS — E1165 Type 2 diabetes mellitus with hyperglycemia: Secondary | ICD-10-CM

## 2013-03-27 DIAGNOSIS — Z9119 Patient's noncompliance with other medical treatment and regimen: Secondary | ICD-10-CM | POA: Insufficient documentation

## 2013-03-27 DIAGNOSIS — E1142 Type 2 diabetes mellitus with diabetic polyneuropathy: Secondary | ICD-10-CM | POA: Insufficient documentation

## 2013-03-27 DIAGNOSIS — M47817 Spondylosis without myelopathy or radiculopathy, lumbosacral region: Secondary | ICD-10-CM

## 2013-03-27 DIAGNOSIS — IMO0001 Reserved for inherently not codable concepts without codable children: Secondary | ICD-10-CM

## 2013-03-27 DIAGNOSIS — Z794 Long term (current) use of insulin: Secondary | ICD-10-CM | POA: Insufficient documentation

## 2013-03-27 DIAGNOSIS — E1140 Type 2 diabetes mellitus with diabetic neuropathy, unspecified: Secondary | ICD-10-CM

## 2013-03-27 DIAGNOSIS — Z0000 Encounter for general adult medical examination without abnormal findings: Secondary | ICD-10-CM | POA: Insufficient documentation

## 2013-03-27 DIAGNOSIS — Z91199 Patient's noncompliance with other medical treatment and regimen due to unspecified reason: Secondary | ICD-10-CM | POA: Insufficient documentation

## 2013-03-27 DIAGNOSIS — E1149 Type 2 diabetes mellitus with other diabetic neurological complication: Secondary | ICD-10-CM | POA: Insufficient documentation

## 2013-03-27 DIAGNOSIS — G589 Mononeuropathy, unspecified: Secondary | ICD-10-CM

## 2013-03-27 LAB — LIPID PANEL
Cholesterol: 257 mg/dL — ABNORMAL HIGH (ref 0–200)
HDL: 57 mg/dL (ref >39)
LDL Cholesterol: 176 mg/dL — ABNORMAL HIGH (ref 0–99)
Total CHOL/HDL Ratio: 4.5 Ratio
Triglycerides: 122 mg/dL (ref <150)
VLDL: 24 mg/dL (ref 0–40)

## 2013-03-27 LAB — GLUCOSE, POCT (MANUAL RESULT ENTRY): POC Glucose: 185 mg/dl — AB (ref 70–99)

## 2013-03-27 MED ORDER — ACETAMINOPHEN 500 MG PO TABS: 500.0000 mg | ORAL_TABLET | Freq: Four times a day (QID) | ORAL | Status: DC | PRN

## 2013-03-27 MED ORDER — METFORMIN HCL 500 MG PO TABS: ORAL_TABLET | ORAL | Status: DC

## 2013-03-27 MED ORDER — GLUCOSE BLOOD VI STRP: ORAL_STRIP | Status: DC

## 2013-03-27 NOTE — Progress Notes (Signed)
Patient Demographics  [name] [name], is a 47 y.o. female  [id number]  [id number]  DOB - 1966/06/26  Chief Complaint  Patient presents with  . Establish Care  . Diabetes        Subjective:   [name] [name] today is here for a follow up visit. Patient is a 47 year old African American female who has a history of uncontrolled diabetes, diabetic neuropathy and chronic back pain. I suspect she has been noncompliant with medication, noncompliant with instructions and noncompliant with diet as well. She claims to me that she has not checked her sugars for the past 2 weeks, as she ran out of her insulin strips. She is supposed to be on Lantus and NovoLog, however she seems not to know which medication to take when. She cannot clearly tell me the dosage of her insulin regimen, she is supposed to take 40 units of Lantus, but is only taking 20 units (this was revealed after repeated questioning), she claims she is on a sliding scale NovoLog-but is not checking her CBGs so she has been injecting approximately 10-15 units with meals. She is asking for a refill for Neurontin-upon further questioning, she was given 90 tablets of 300 mg of Neurontin recently, according to her she had this filled on 9/19, and she finished all the 90 tablets by 9/22. She appears slightly drowsy during my visit. She claims that she took all the excess Neurontin because of uncontrolled pain. Review of her EMR shows that she also follows with Dr. Tessa Lerner for pain management. She denies any suicidal or homicidal intent, she is able to answer all questions appropriately.   Patient has No headache, No chest pain, No abdominal pain - No Nausea,  No Cough - SOB.   Objective:    Filed Vitals:   03/27/13 1026  BP: 127/89  Pulse: 63  Temp: 98.5 F (36.9 C)  TempSrc: Oral  Resp: 16  Height: 5' (1.524 m)  Weight: 165 lb (74.844 kg)  SpO2: 97%     ALLERGIES:  No Known Allergies  PAST MEDICAL HISTORY: Past Medical History   Diagnosis Date  . Diabetes mellitus     MEDICATIONS AT HOME: Prior to Admission medications   Medication Sig Start Date End Date Taking? Authorizing Provider  aspirin EC 81 MG tablet Take 81 mg by mouth every morning.   Yes Historical Provider, MD  fish oil-omega-3 fatty acids 1000 MG capsule Take 2 capsules (2 g total) by mouth daily. 01/09/13  Yes Robbie Lis, MD  insulin aspart (NOVOLOG) 100 UNIT/ML injection Inject 25 Units into the skin 3 (three) times daily before meals. As advised 02/02/13  Yes Philemon Kingdom, MD  insulin glargine (LANTUS) 100 UNIT/ML injection Inject 0.4 mLs (40 Units total) into the skin at bedtime. 02/02/13  Yes Philemon Kingdom, MD  acetaminophen (TYLENOL) 500 MG tablet Take 1 tablet (500 mg total) by mouth every 6 (six) hours as needed for pain. 03/27/13   Russ Looper Kristeen Mans, MD  cephALEXin (KEFLEX) 500 MG capsule Take 1 capsule (500 mg total) by mouth 3 (three) times daily. 02/16/13   Harden Mo, MD  glucose blood test strip Use as instructed 03/27/13   Jonetta Osgood, MD  metFORMIN (GLUCOPHAGE) 500 MG tablet Take daily for 7 days, and then increase to twice daily. 03/27/13   Daaiyah Baumert Kristeen Mans, MD  mupirocin ointment (BACTROBAN) 2 % Apply topically 3 (three) times daily. 02/16/13   Harden Mo, MD     Exam  General appearance :Awake, alert, not in any distress. Speech Clear. Not toxic Looking HEENT: Atraumatic and Normocephalic, pupils equally reactive to light and accomodation Neck: supple, no JVD. No cervical lymphadenopathy.  Chest:Good air entry bilaterally, no added sounds  CVS: S1 S2 regular, no murmurs.  Abdomen: Bowel sounds present, Non tender and not distended with no gaurding, rigidity or rebound. Extremities: B/L Lower Ext shows no edema, both legs are warm to touch Neurology: Awake alert, and oriented X 3, CN II-XII intact, Non focal Skin:No Rash Wounds:N/A    Data Review   CBC No results found for this basename: WBC, HGB, HCT, PLT,  MCV, MCH, MCHC, RDW, NEUTRABS, LYMPHSABS, MONOABS, EOSABS, BASOSABS, BANDABS, BANDSABD,  in the last 168 hours  Chemistries   No results found for this basename: NA, K, CL, CO2, GLUCOSE, BUN, CREATININE, GFRCGP, CALCIUM, MG, AST, ALT, ALKPHOS, BILITOT,  in the last 168 hours ------------------------------------------------------------------------------------------------------------------ No results found for this basename: HGBA1C,  in the last 72 hours ------------------------------------------------------------------------------------------------------------------ No results found for this basename: CHOL, HDL, LDLCALC, TRIG, CHOLHDL, LDLDIRECT,  in the last 72 hours ------------------------------------------------------------------------------------------------------------------ No results found for this basename: TSH, T4TOTAL, FREET3, T3FREE, THYROIDAB,  in the last 72 hours ------------------------------------------------------------------------------------------------------------------ No results found for this basename: VITAMINB12, FOLATE, FERRITIN, TIBC, IRON, RETICCTPCT,  in the last 72 hours  Coagulation profile  No results found for this basename: INR, PROTIME,  in the last 168 hours    Assessment & Plan  Uncontrolled diabetes - Last A1c was 16.8 in July of 2014 - Clearly this is not a desirable situation-I have spent a good amount of time with her in the clinic today, counseling regarding the importance of checking her sugars and taking the appropriate dosing of insulin. Clearly she has been noncompliant with insulin, CBG checks. This is a difficult situation, need to up titrate insulin dosing, but patient is noncompliant with insulin instructions, and has also not been checking her sugars- therefore it's hard to accurately dose of insulin. I have explained all this to the patient very clearly, I have also explained the long-term side effects of uncontrolled diabetes mellitus CVA,  MI, renal failure et Ronney Asters. She clearly needs further counseling, and better understanding of her disease and the need to take personal responsibility of her own health. - For now, CBG is 180 today, will start her on metformin 500 mg once a day, if she tolerates this she should increase it to 500 mg twice a day. - I will see if we can help her with insulin strips-I've asked the nurse and the social worker to see if we can help her with this. - Have asked her to followup in one week, I have asked her to get [place] readings of her CBGs,to accurately and safely adjust insulin regimen. -all of f these instructions, were repeatedly told to the patient, she is claims understanding  Diabetic neuropathy - She has finished 90 tablets of Neurontin in approximately 5 days!! - I've explained to her that we will no longer provide her with any Neurontin because of noncompliance with instructions. I have told her repeatedly that the first step demonstrate compliance to medications and followup, and then we can reconsider restarting Neurontin. - For now I have asked her to take Tylenol.  Noncompliance to physician instruction, insulin dosing  - see above  Please note, about the issue of her finishing 90 tablets Neurontin in 5 days,  noncompliance with physician instructions and insulin dosing, have all been discussed with the patient  RN, Mudlogger. of the clinic, to see if we can provide her with additional resources, and any other form of assistance.  Follow up in one week   The patient was given clear instructions to go to ER or return to medical center if symptoms don't improve, worsen or new problems develop. The patient verbalized understanding. The patient was told to call to get lab results if they haven't heard anything in the next week.

## 2013-03-27 NOTE — Progress Notes (Signed)
PT HERE TO ESTABLISH CARE FOR HX DIABETES WITH INSULIN DEPENDENT C/O NEUROPATHY LOWER BACK RADIATING TO BOTH LEGS  STATES SHE IS WAITING ON LYRICA SUPPLY NEED TEST STRIP REFILLS LAST CBG 2 WEEKS AGO NEED A1C/CBG

## 2013-03-28 LAB — HEMOGLOBIN A1C

## 2013-04-02 ENCOUNTER — Encounter: Payer: Self-pay | Admitting: Physical Medicine & Rehabilitation

## 2013-04-02 ENCOUNTER — Encounter
Payer: Managed Care, Other (non HMO) | Attending: Physical Medicine & Rehabilitation | Admitting: Physical Medicine & Rehabilitation

## 2013-04-02 VITALS — BP 145/87 | HR 70 | Resp 14 | Ht 60.0 in | Wt 176.0 lb

## 2013-04-02 DIAGNOSIS — E1165 Type 2 diabetes mellitus with hyperglycemia: Secondary | ICD-10-CM

## 2013-04-02 DIAGNOSIS — IMO0001 Reserved for inherently not codable concepts without codable children: Secondary | ICD-10-CM | POA: Insufficient documentation

## 2013-04-02 DIAGNOSIS — M47817 Spondylosis without myelopathy or radiculopathy, lumbosacral region: Secondary | ICD-10-CM | POA: Insufficient documentation

## 2013-04-02 DIAGNOSIS — E1149 Type 2 diabetes mellitus with other diabetic neurological complication: Secondary | ICD-10-CM

## 2013-04-02 DIAGNOSIS — E1140 Type 2 diabetes mellitus with diabetic neuropathy, unspecified: Secondary | ICD-10-CM

## 2013-04-02 DIAGNOSIS — E1142 Type 2 diabetes mellitus with diabetic polyneuropathy: Secondary | ICD-10-CM

## 2013-04-02 MED ORDER — MELOXICAM 7.5 MG PO TABS: 7.5000 mg | ORAL_TABLET | Freq: Every day | ORAL | Status: DC

## 2013-04-02 MED ORDER — METHOCARBAMOL 500 MG PO TABS: 500.0000 mg | ORAL_TABLET | Freq: Four times a day (QID) | ORAL | Status: DC | PRN

## 2013-04-02 NOTE — Patient Instructions (Signed)
CONTINUE TO WORK ON RANGE OF MOTION AND STRETCHING FOR YOUR LOW BACK

## 2013-04-02 NOTE — Progress Notes (Signed)
Subjective:    Patient ID: [name] [name], female    DOB: [birth year], 47 y.o.   MRN: [id number]  HPI  [name] [name] is here in follow up of her pain issues. She just received the lyrica a week ago. It has worked much better than lyrica. She is taking 50mg  TID. She was taking too many gabapentin at a time because they were ineffective.  She has having increased pain in her low back. It seems to be worst at night. It feels as if she has a "knot" in her back. Her bending is limited as well as her general movement. It feels as if it's worsened over the last month. It also bothers her when she sits or walks. The pain sometimes goes down her left buttock into the knee/calf.   Pain Inventory Average Pain 8 Pain Right Now 6 My pain is constant, sharp, dull, stabbing and aching  In the last 24 hours, has pain interfered with the following? General activity 6 Relation with others 0 Enjoyment of life 0 What TIME of day is your pain at its worst? constant Sleep (in general) Fair  Pain is worse with: bending and lying down Pain improves with: medication Relief from Meds: 4  Mobility use a cane how many minutes can you walk? 10 ability to climb steps?  yes do you drive?  yes Do you have any goals in this area?  yes  Function not employed: date last employed 01/2013  Neuro/Psych bowel control problems weakness numbness trouble walking spasms  Prior Studies Any changes since last visit?  no  Physicians involved in your care Any changes since last visit?  no   Family History  Problem Relation Age of Onset  . Diabetes Father   . Heart attack Father    History   Social History  . Marital Status: Legally Separated    Spouse Name: N/A    Number of Children: N/A  . Years of Education: N/A   Social History Main Topics  . Smoking status: Never Smoker   . Smokeless tobacco: Never Used  . Alcohol Use: No  . Drug Use: No  . Sexual Activity: Yes   Other Topics Concern  . None    Social History Narrative  . None   Past Surgical History  Procedure Laterality Date  . Abdominal hysterectomy    . Tubal ligation    . Tonsillectomy    . Cesarean section    . Ganglion cyst excision    . Cholecystectomy     Past Medical History  Diagnosis Date  . Diabetes mellitus    BP 145/87  Pulse 70  Resp 14  Ht 5' (1.524 m)  Wt 176 lb (79.833 kg)  BMI 34.37 kg/m2  SpO2 100%     Review of Systems  Gastrointestinal: Positive for constipation.  Musculoskeletal: Positive for myalgias, back pain, arthralgias and gait problem.  Neurological: Positive for dizziness and numbness.  All other systems reviewed and are negative.       Objective:   Physical Exam  General: Alert and oriented x 3, No apparent distress. Abdominally obese.  HEENT: Head is normocephalic, atraumatic, PERRLA, EOMI, sclera anicteric, oral mucosa pink and moist, dentition intact, ext ear canals clear,  Neck: Supple without JVD or lymphadenopathy  Heart: Reg rate and rhythm. No murmurs rubs or gallops  Chest: CTA bilaterally without wheezes, rales, or rhonchi; no distress  Abdomen: Soft, non-tender, non-distended, bowel sounds positive.  Extremities: No clubbing, cyanosis, or edema. Pulses  are 2+  Skin: Clean and intact without signs of breakdown  Neuro: Pt is cognitively appropriate with normal insight, memory, and awareness. Cranial nerves 2-12 are intact. Sensory exam is 1/2 to temperature and LT from knees to ankle and trace/2 at the feet. She also has mild sensory loss at the finger tips bilaterally. Reflexes are 1+ in UE's and trace/absent in the lowers. Fine motor coordination is intact. No tremors. Motor function is grossly 5/5 in the uppers. She is 3+ to 4/5 in the proximal LE's to 3+ distally----there was a large pain inhibition component.  Musculoskeletal: she was hypersensitive to touch in the legs bilaterally below the knees. Low back was very tender with palpation over the L5-S1  segment, right more than left. Right lumbar paraspinals were tight. She was tight in the trunk and both hamstrings with stretching. Flexion caused the most pain today, and she could only flex to about 45 degrees.  Psych: Pt's affect is appropriate. Pt is cooperative   Assessment & Plan:   1. Uncontrolled diabetes with neuropathy  2. Low back pain which appears to be generalized spondylosis, mild facet arthropathy. Her DDD is mild on recent back xray. Symptoms are exacerbated by tight core muscles and prolonged standing while at work.  3. Mild obesity.  4. Gait disorder due to the above.    Plan:  1. Lyrica to continue at 50mg  TID 2. Will try meloxicam 7.5mg  daily  3. Continue better control of DM 4. Continue Pilates based exercises and stretches addressing posture and her low back and pelvis. She needs to think about posture, stretching, and rest breaks while she works.  5. Add robaxin 500mg  q6hr prn for muscle spasms. If pain doesn't improve over the next month or so, we'll pursue an MRI of her back to assess the L5-S1 disk space 6. Follow up with me in 6 weeks. 30 minutes of face to face patient care time were spent during this visit. All questions were encouraged and answered.

## 2013-04-03 ENCOUNTER — Ambulatory Visit: Payer: No Typology Code available for payment source | Attending: Internal Medicine

## 2013-04-03 VITALS — BP 150/87 | HR 77 | Temp 98.1°F | Resp 18

## 2013-04-03 DIAGNOSIS — E119 Type 2 diabetes mellitus without complications: Secondary | ICD-10-CM | POA: Insufficient documentation

## 2013-04-03 LAB — GLUCOSE, POCT (MANUAL RESULT ENTRY): POC Glucose: 88 mg/dl (ref 70–99)

## 2013-04-03 NOTE — Progress Notes (Signed)
PT HERE FOR REPEAT CBG

## 2013-04-03 NOTE — Patient Instructions (Signed)
CBG 88  PT HAS BEEN KEEPING BLOOD SUGAR MONITOR AND TAKING MEDICATIONS

## 2013-04-09 ENCOUNTER — Emergency Department (HOSPITAL_COMMUNITY)
Admission: EM | Admit: 2013-04-09 | Discharge: 2013-04-09 | Disposition: A | Discharge status: Home / Self Care | Payer: No Typology Code available for payment source | Attending: Emergency Medicine | Admitting: Emergency Medicine

## 2013-04-09 ENCOUNTER — Encounter (HOSPITAL_COMMUNITY): Payer: Self-pay | Admitting: *Deleted

## 2013-04-09 DIAGNOSIS — M5432 Sciatica, left side: Secondary | ICD-10-CM

## 2013-04-09 DIAGNOSIS — E119 Type 2 diabetes mellitus without complications: Secondary | ICD-10-CM | POA: Insufficient documentation

## 2013-04-09 DIAGNOSIS — Z7982 Long term (current) use of aspirin: Secondary | ICD-10-CM | POA: Insufficient documentation

## 2013-04-09 DIAGNOSIS — Z79899 Other long term (current) drug therapy: Secondary | ICD-10-CM | POA: Insufficient documentation

## 2013-04-09 DIAGNOSIS — R5381 Other malaise: Secondary | ICD-10-CM | POA: Insufficient documentation

## 2013-04-09 DIAGNOSIS — Z794 Long term (current) use of insulin: Secondary | ICD-10-CM | POA: Insufficient documentation

## 2013-04-09 DIAGNOSIS — M5430 Sciatica, unspecified side: Secondary | ICD-10-CM | POA: Insufficient documentation

## 2013-04-09 DIAGNOSIS — M47817 Spondylosis without myelopathy or radiculopathy, lumbosacral region: Secondary | ICD-10-CM

## 2013-04-09 DIAGNOSIS — Z791 Long term (current) use of non-steroidal anti-inflammatories (NSAID): Secondary | ICD-10-CM | POA: Insufficient documentation

## 2013-04-09 DIAGNOSIS — R209 Unspecified disturbances of skin sensation: Secondary | ICD-10-CM | POA: Insufficient documentation

## 2013-04-09 MED ORDER — METHOCARBAMOL 500 MG PO TABS: 500.0000 mg | ORAL_TABLET | Freq: Four times a day (QID) | ORAL | Status: DC | PRN

## 2013-04-09 MED ORDER — DIAZEPAM 5 MG/ML IJ SOLN
5.0000 mg | Freq: Once | INTRAMUSCULAR | Status: AC
  Administered 2013-04-09: 5 mg via INTRAMUSCULAR
  Filled 2013-04-09: qty 2

## 2013-04-09 MED ORDER — HYDROCODONE-ACETAMINOPHEN 5-325 MG PO TABS: 1.0000 | ORAL_TABLET | ORAL | Status: DC | PRN

## 2013-04-09 MED ORDER — HYDROMORPHONE HCL PF 1 MG/ML IJ SOLN
1.0000 mg | Freq: Once | INTRAMUSCULAR | Status: AC
  Administered 2013-04-09: 1 mg via INTRAMUSCULAR
  Filled 2013-04-09: qty 1

## 2013-04-09 NOTE — ED Notes (Signed)
Pt moved from wc to stretcher without assistance. Moving slowly and guarded.

## 2013-04-09 NOTE — ED Provider Notes (Signed)
CSN: [id number]     Arrival date & time 04/09/13  1720 History   None    This chart was scribed for non-physician practitioner, Ignacia Felling PA-C, working with Johnna Acosta, MD by Forrestine Him, ED Scribe. This patient was seen in room TR11C/TR11C and the patient's care was started at 7:20 PM.   Chief Complaint  Patient presents with  . Back Pain    The history is provided by the patient and the spouse. No language interpreter was used.   HPI Comments: [name] [name] is a 47 y.o. Female with hx of DM nerve pain who presents to the Emergency Department complaining of gradual onset, gradually worsening, constant lower back pain with associated back spasms that started last night. Pt denies any recent injury. Pt states the pain also radiated down her left leg, and states movement worsens the pain. Pt also reports weakness in her left leg, along with some numbness in her feet. Pt states she has been taking Lyrica with no relief. Pt denies any urinary or bowel changes. Pt denies any numbness or tingling in the groin area. Pt denies fever or chills.  Past Medical History  Diagnosis Date  . Diabetes mellitus    Past Surgical History  Procedure Laterality Date  . Abdominal hysterectomy    . Tubal ligation    . Tonsillectomy    . Cesarean section    . Ganglion cyst excision    . Cholecystectomy     Family History  Problem Relation Age of Onset  . Diabetes Father   . Heart attack Father    History  Substance Use Topics  . Smoking status: Never Smoker   . Smokeless tobacco: Never Used  . Alcohol Use: No   OB History   Grav Para Term Preterm Abortions TAB SAB Ect Mult Living                 Review of Systems  Constitutional: Negative for fever and chills.  Musculoskeletal: Positive for back pain.  Neurological: Positive for weakness.  All other systems reviewed and are negative.    Allergies  Review of patient's allergies indicates no known allergies.  Home Medications    Current Outpatient Rx  Name  Route  Sig  Dispense  Refill  . acetaminophen (TYLENOL) 500 MG tablet   Oral   Take 1 tablet (500 mg total) by mouth every 6 (six) hours as needed for pain.   30 tablet   0   . aspirin EC 81 MG tablet   Oral   Take 81 mg by mouth every morning.         . fish oil-omega-3 fatty acids 1000 MG capsule   Oral   Take 2 capsules (2 g total) by mouth daily.   30 capsule   3   . glucose blood test strip      Use as instructed   100 each   12   . insulin aspart (NOVOLOG) 100 UNIT/ML injection   Subcutaneous   Inject 25 Units into the skin 3 (three) times daily before meals. As advised   3 vial   6   . insulin glargine (LANTUS) 100 UNIT/ML injection   Subcutaneous   Inject 0.4 mLs (40 Units total) into the skin at bedtime.   30 mL   3   . meloxicam (MOBIC) 7.5 MG tablet   Oral   Take 1 tablet (7.5 mg total) by mouth daily.   30 tablet  0   . metFORMIN (GLUCOPHAGE) 500 MG tablet      Take daily for 7 days, and then increase to twice daily.   60 tablet   3   . methocarbamol (ROBAXIN) 500 MG tablet   Oral   Take 1 tablet (500 mg total) by mouth every 6 (six) hours as needed.   75 tablet   3   . mupirocin ointment (BACTROBAN) 2 %   Topical   Apply topically 3 (three) times daily.   22 g   0   . pregabalin (LYRICA) 50 MG capsule   Oral   Take 50 mg by mouth 3 (three) times daily.          Triage Vitals: BP 156/86  Pulse 82  Temp(Src) 98.3 F (36.8 C) (Oral)  Resp 18  Wt 175 lb 1 oz (79.408 kg)  BMI 34.19 kg/m2  SpO2 100%  Physical Exam  Nursing note and vitals reviewed. Constitutional: She is oriented to person, place, and time. She appears well-developed and well-nourished.  HENT:  Head: Normocephalic and atraumatic.  Eyes: EOM are normal.  Neck: Normal range of motion.  Cardiovascular: Normal rate.   Pulmonary/Chest: Effort normal.  Musculoskeletal: She exhibits tenderness.  Positive for straight leg  raise Tenderness to palpation over entire lumbar spine  Neurological: She is alert and oriented to person, place, and time.  Skin: Skin is warm and dry.  Psychiatric: She has a normal mood and affect. Her behavior is normal.    ED Course  Procedures (including critical care time)  DIAGNOSTIC STUDIES: Oxygen Saturation is 100% on RA, Normal by my interpretation.    COORDINATION OF CARE: 7:27 PM- Will give muscle relaxer and pain medication. Discussed treatment plan with pt at bedside and pt agreed to plan.     9:02 PM Reports considerable improvement in pain, will give short course pain medication - is supposed to be on meloxicam and is encouraged to get this filled for the anti-inflammatory effect. Labs Review Labs Reviewed - No data to display Imaging Review No results found.  MDM  Sciatica  Patient here with worsening lower back pain with radiation down the back of her left leg.  There are no alarming signs to suggest cauda equina or epidural hematoma, patient expresses pain relief with medication - she will be referred back to her pain clinic for further management.  I personally performed the services described in this documentation, which was scribed in my presence. The recorded information has been reviewed and is accurate. \   Joaquim Lai C. Joelyn Oms, [place] 04/09/13 2104

## 2013-04-09 NOTE — ED Notes (Signed)
Pt is here with lower back pain that started last nite and radiates down left leg.  No injury.  No urinary or vaginal issues.  Pain with movement

## 2013-04-10 NOTE — ED Provider Notes (Signed)
Medical screening examination/treatment/procedure(s) were performed by non-physician practitioner and as supervising physician I was immediately available for consultation/collaboration.    Johnna Acosta, MD 04/10/13 Berniece Salines

## 2013-05-07 ENCOUNTER — Encounter (HOSPITAL_COMMUNITY): Payer: Self-pay | Admitting: Emergency Medicine

## 2013-05-07 ENCOUNTER — Emergency Department (INDEPENDENT_AMBULATORY_CARE_PROVIDER_SITE_OTHER)
Admission: EM | Admit: 2013-05-07 | Discharge: 2013-05-07 | Disposition: A | Discharge status: Home / Self Care | Payer: Managed Care, Other (non HMO) | Source: Home / Self Care | Attending: Family Medicine | Admitting: Family Medicine

## 2013-05-07 DIAGNOSIS — E1140 Type 2 diabetes mellitus with diabetic neuropathy, unspecified: Secondary | ICD-10-CM

## 2013-05-07 DIAGNOSIS — M545 Low back pain, unspecified: Secondary | ICD-10-CM

## 2013-05-07 DIAGNOSIS — E1149 Type 2 diabetes mellitus with other diabetic neurological complication: Secondary | ICD-10-CM

## 2013-05-07 DIAGNOSIS — E1142 Type 2 diabetes mellitus with diabetic polyneuropathy: Secondary | ICD-10-CM

## 2013-05-07 MED ORDER — KETOROLAC TROMETHAMINE 30 MG/ML IJ SOLN
30.0000 mg | Freq: Once | INTRAMUSCULAR | Status: AC
  Administered 2013-05-07: 30 mg via INTRAMUSCULAR

## 2013-05-07 MED ORDER — PREGABALIN 100 MG PO CAPS: 100.0000 mg | ORAL_CAPSULE | Freq: Three times a day (TID) | ORAL | Status: DC

## 2013-05-07 MED ORDER — CYCLOBENZAPRINE HCL 5 MG PO TABS: 5.0000 mg | ORAL_TABLET | Freq: Three times a day (TID) | ORAL | Status: DC | PRN

## 2013-05-07 MED ORDER — KETOROLAC TROMETHAMINE 30 MG/ML IJ SOLN
INTRAMUSCULAR | Status: AC
  Filled 2013-05-07: qty 1

## 2013-05-07 NOTE — ED Notes (Signed)
Evaluated by dr Juventino Slovak prior to this nurse

## 2013-05-07 NOTE — ED Provider Notes (Signed)
CSN: [id number]     Arrival date & time 05/07/13  1527 History   First MD Initiated Contact with Patient 05/07/13 1600     Chief Complaint  Patient presents with  . Peripheral Neuropathy   (Consider location/radiation/quality/duration/timing/severity/associated sxs/prior Treatment) Patient is a 47 y.o. female presenting with back pain. The history is provided by the patient and the spouse.  Back Pain Location:  Lumbar spine Quality:  Burning and shooting Radiates to:  L posterior upper leg and R posterior upper leg Pain severity:  Moderate Onset quality:  Gradual Duration:  2 days Timing:  Sporadic Progression:  Waxing and waning Chronicity:  Chronic Context: not emotional stress, not falling, not lifting heavy objects and not physical stress   Associated symptoms: paresthesias   Associated symptoms: no abdominal pain, no bladder incontinence, no bowel incontinence, no fever, no pelvic pain and no weakness   Risk factors: not obese   Risk factors comment:  H/o neuropathy, lyrica helps.   Past Medical History  Diagnosis Date  . Diabetes mellitus    Past Surgical History  Procedure Laterality Date  . Abdominal hysterectomy    . Tubal ligation    . Tonsillectomy    . Cesarean section    . Ganglion cyst excision    . Cholecystectomy     Family History  Problem Relation Age of Onset  . Diabetes Father   . Heart attack Father    History  Substance Use Topics  . Smoking status: Never Smoker   . Smokeless tobacco: Never Used  . Alcohol Use: No   OB History   Grav Para Term Preterm Abortions TAB SAB Ect Mult Living                 Review of Systems  Unable to perform ROS Constitutional: Negative for fever.  Gastrointestinal: Negative.  Negative for abdominal pain and bowel incontinence.  Genitourinary: Negative.  Negative for bladder incontinence and pelvic pain.  Musculoskeletal: Positive for back pain.  Neurological: Positive for paresthesias. Negative for  weakness.    Allergies  Review of patient's allergies indicates no known allergies.  Home Medications   Current Outpatient Rx  Name  Route  Sig  Dispense  Refill  . acetaminophen (TYLENOL) 500 MG tablet   Oral   Take 1 tablet (500 mg total) by mouth every 6 (six) hours as needed for pain.   30 tablet   0   . aspirin EC 81 MG tablet   Oral   Take 81 mg by mouth every morning.         . cyclobenzaprine (FLEXERIL) 5 MG tablet   Oral   Take 1 tablet (5 mg total) by mouth 3 (three) times daily as needed for muscle spasms.   30 tablet   0   . fish oil-omega-3 fatty acids 1000 MG capsule   Oral   Take 2 capsules (2 g total) by mouth daily.   30 capsule   3   . glucose blood test strip      Use as instructed   100 each   12   . HYDROcodone-acetaminophen (NORCO/VICODIN) 5-325 MG per tablet   Oral   Take 1 tablet by mouth every 4 (four) hours as needed for pain.   15 tablet   0   . insulin aspart (NOVOLOG) 100 UNIT/ML injection   Subcutaneous   Inject 25 Units into the skin 3 (three) times daily before meals. As advised   3 vial  6   . insulin glargine (LANTUS) 100 UNIT/ML injection   Subcutaneous   Inject 0.4 mLs (40 Units total) into the skin at bedtime.   30 mL   3   . meloxicam (MOBIC) 7.5 MG tablet   Oral   Take 1 tablet (7.5 mg total) by mouth daily.   30 tablet   0   . metFORMIN (GLUCOPHAGE) 500 MG tablet   Oral   Take 500 mg by mouth 2 (two) times daily with a meal. Take daily for 7 days, and then increase to twice daily.         . methocarbamol (ROBAXIN) 500 MG tablet   Oral   Take 1 tablet (500 mg total) by mouth every 6 (six) hours as needed.   75 tablet   3   . mupirocin ointment (BACTROBAN) 2 %   Topical   Apply topically 3 (three) times daily.   22 g   0   . pregabalin (LYRICA) 100 MG capsule   Oral   Take 1 capsule (100 mg total) by mouth 3 (three) times daily after meals.   90 capsule   1   . pregabalin (LYRICA) 50 MG  capsule   Oral   Take 50 mg by mouth 3 (three) times daily.          BP 155/95  Pulse 74  Temp(Src) 98.2 F (36.8 C) (Oral)  Resp 16  SpO2 100% Physical Exam  Nursing note and vitals reviewed. Constitutional: She is oriented to person, place, and time. She appears well-developed and well-nourished.  Abdominal: Soft. Bowel sounds are normal.  Musculoskeletal: She exhibits tenderness.       Lumbar back: She exhibits tenderness, bony tenderness, pain and spasm. She exhibits no swelling, no edema and normal pulse.       Back:  Neurological: She is alert and oriented to person, place, and time.  Skin: Skin is warm and dry.    ED Course  Procedures (including critical care time) Labs Review Labs Reviewed - No data to display Imaging Review No results found.    MDM      Billy Fischer, MD 05/07/13 [id number]

## 2013-05-09 ENCOUNTER — Ambulatory Visit: Payer: Managed Care, Other (non HMO) | Attending: Internal Medicine | Admitting: Internal Medicine

## 2013-05-09 ENCOUNTER — Encounter: Payer: Self-pay | Admitting: Internal Medicine

## 2013-05-09 VITALS — BP 138/85 | HR 69 | Temp 98.0°F | Resp 18 | Wt 168.0 lb

## 2013-05-09 DIAGNOSIS — E1165 Type 2 diabetes mellitus with hyperglycemia: Secondary | ICD-10-CM

## 2013-05-09 DIAGNOSIS — M549 Dorsalgia, unspecified: Secondary | ICD-10-CM

## 2013-05-09 LAB — CBC WITH DIFFERENTIAL/PLATELET
Basophils Absolute: 0 10*3/uL (ref 0.0–0.1)
Basophils Relative: 1 % (ref 0–1)
Eosinophils Absolute: 0.1 10*3/uL (ref 0.0–0.7)
Eosinophils Relative: 2 % (ref 0–5)
HCT: 40.2 % (ref 36.0–46.0)
Hemoglobin: 13.1 g/dL (ref 12.0–15.0)
Lymphocytes Relative: 42 % (ref 12–46)
Lymphs Abs: 2.4 10*3/uL (ref 0.7–4.0)
MCH: 27 pg (ref 26.0–34.0)
MCHC: 32.6 g/dL (ref 30.0–36.0)
MCV: 82.9 fL (ref 78.0–100.0)
Monocytes Absolute: 0.4 10*3/uL (ref 0.1–1.0)
Monocytes Relative: 7 % (ref 3–12)
Neutro Abs: 2.7 10*3/uL (ref 1.7–7.7)
Neutrophils Relative %: 48 % (ref 43–77)
Platelets: 187 10*3/uL (ref 150–400)
RBC: 4.85 MIL/uL (ref 3.87–5.11)
RDW: 13.7 % (ref 11.5–15.5)
WBC: 5.6 10*3/uL (ref 4.0–10.5)

## 2013-05-09 LAB — BASIC METABOLIC PANEL
BUN: 14 mg/dL (ref 6–23)
CO2: 25 mEq/L (ref 19–32)
Calcium: 9 mg/dL (ref 8.4–10.5)
Chloride: 103 mEq/L (ref 96–112)
Creat: 0.67 mg/dL (ref 0.50–1.10)
Glucose, Bld: 227 mg/dL — ABNORMAL HIGH (ref 70–99)
Potassium: 4.1 mEq/L (ref 3.5–5.3)
Sodium: 136 mEq/L (ref 135–145)

## 2013-05-09 LAB — TSH: TSH: 1.656 u[IU]/mL (ref 0.350–4.500)

## 2013-05-09 LAB — GLUCOSE, POCT (MANUAL RESULT ENTRY): POC Glucose: 385 mg/dl — AB (ref 70–99)

## 2013-05-09 LAB — HEMOGLOBIN A1C
Hgb A1c MFr Bld: 13.8 % — ABNORMAL HIGH (ref <5.7)
Mean Plasma Glucose: 349 mg/dL — ABNORMAL HIGH (ref <117)

## 2013-05-09 MED ORDER — DULOXETINE HCL 30 MG PO CPEP: 30.0000 mg | ORAL_CAPSULE | Freq: Every day | ORAL | Status: DC

## 2013-05-09 MED ORDER — CYCLOBENZAPRINE HCL 5 MG PO TABS: 5.0000 mg | ORAL_TABLET | Freq: Three times a day (TID) | ORAL | Status: DC | PRN

## 2013-05-09 NOTE — Progress Notes (Unsigned)
Pt is here for persistent back pain Pain is constant and radiates down to legs... Hx of DM neuropathy ... CBG today = 385 non fasting Went to Urgent Care --- was told to increase her Lyrica to 100mg  and given Flexeril Has not been able to p/u flexeril  Denies any recent inj/trauma, strenuous activity... Would like to have xrays done.  Alert w/no signs of acute distress... Ambulated to exam room w/NAD

## 2013-05-09 NOTE — Progress Notes (Unsigned)
Patient ID: [name] [name], female   DOB: 04/30/1966, 47 y.o.   MRN: [id number]   HPI: This is a 47 year old female who is following up for chronic back pain which radiates down both legs. She has been prescribed multiple medications for this but currently is only taking Lyrica which was increased at urgent care a few days ago to 100 mg 3 times a day and Robaxin 500 mg as needed. She states in the past week the pain has been so severe that she is unable to sleep at night. She previously went to see Dr. Tessa Lerner and he gave her advice in regards to exercises. The patient states that the exercises he recommended actually increased the pain and therefore she has discontinued them.  I have also discussed her blood sugars with her. She states that her fasting sugars at times are 100 and at other times are 300 depending upon how much pain she has through the night. She has been taking her medication as prescribed. No Known Allergies Past Medical History  Diagnosis Date  . Diabetes mellitus     Family History  Problem Relation Age of Onset  . Diabetes Father   . Heart attack Father    History   Social History  . Marital Status: Married    Spouse Name: N/A    Number of Children: N/A  . Years of Education: N/A   Occupational History  . Not on file.   Social History Main Topics  . Smoking status: Never Smoker   . Smokeless tobacco: Never Used  . Alcohol Use: No  . Drug Use: No  . Sexual Activity: Yes   Other Topics Concern  . Not on file   Social History Narrative  . No narrative on file   Current Outpatient Prescriptions on File Prior to Visit  Medication Sig Dispense Refill  . aspirin EC 81 MG tablet Take 81 mg by mouth every morning.      . fish oil-omega-3 fatty acids 1000 MG capsule Take 2 capsules (2 g total) by mouth daily.  30 capsule  3  . glucose blood test strip Use as instructed  100 each  12  . insulin aspart (NOVOLOG) 100 UNIT/ML injection Inject 25 Units into the skin  3 (three) times daily before meals. As advised  3 vial  6  . insulin glargine (LANTUS) 100 UNIT/ML injection Inject 0.4 mLs (40 Units total) into the skin at bedtime.  30 mL  3  . metFORMIN (GLUCOPHAGE) 500 MG tablet Take 500 mg by mouth 2 (two) times daily with a meal. Take daily for 7 days, and then increase to twice daily.      . methocarbamol (ROBAXIN) 500 MG tablet Take 1 tablet (500 mg total) by mouth every 6 (six) hours as needed.  75 tablet  3  . mupirocin ointment (BACTROBAN) 2 % Apply topically 3 (three) times daily.  22 g  0  . pregabalin (LYRICA) 100 MG capsule Take 1 capsule (100 mg total) by mouth 3 (three) times daily after meals.  90 capsule  1  . acetaminophen (TYLENOL) 500 MG tablet Take 1 tablet (500 mg total) by mouth every 6 (six) hours as needed for pain.  30 tablet  0   No current facility-administered medications on file prior to visit.     Objective:   Filed Vitals:   05/09/13 0946  BP: 138/85  Pulse: 69  Temp: 98 F (36.7 C)  Resp: 18   Filed Weights  05/09/13 0946  Weight: 168 lb (76.204 kg)    Physical Exam ______ Constitutional: Appears well-developed and well-nourished. No distress. ____ HENT: Normocephalic. External right and left ear normal. Oropharynx is clear and moist. ____ Eyes: Conjunctivae and EOM are normal. PERRLA, no scleral icterus. ____ Neck: Normal ROM. Neck supple. No JVD. No tracheal deviation. No thyromegaly. ____ CVS: RRR, S1/S2 +, no murmurs, no gallops, no carotid bruit.  Pulmonary: Effort and breath sounds normal, no stridor, rhonchi, wheezes, rales.  Abdominal: Soft. BS +,  no distension, tenderness, rebound or guarding. ________ Musculoskeletal: Decreased range of motion in back and hips due to pain No edema and no tenderness. ____ Neuro: Alert. Normal reflexes, muscle tone coordination. No cranial nerve deficit. Skin: Skin is warm and dry. No rash noted. Not diaphoretic. No erythema. No pallor. ____ Psychiatric: Normal mood  and affect. Behavior, judgment, thought content normal. __  Lab Results  Component Value Date   WBC 5.9 10/03/2012   HGB 11.9* 10/03/2012   HCT 34.4* 10/03/2012   MCV 80.9 10/03/2012   PLT 157 10/03/2012   Lab Results  Component Value Date   CREATININE 0.71 10/03/2012   BUN 13 10/03/2012   NA 135 10/03/2012   K 3.5 10/03/2012   CL 102 10/03/2012   CO2 26 10/03/2012    Lab Results  Component Value Date   HGBA1C TEST NOT PERFORMED 03/27/2013   Lipid Panel     Component Value Date/Time   CHOL 257* 03/27/2013 1030   TRIG 122 03/27/2013 1030   HDL 57 03/27/2013 1030   CHOLHDL 4.5 03/27/2013 1030   VLDL 24 03/27/2013 1030   LDLCALC 176* 03/27/2013 1030        Patient Active Problem List   Diagnosis Date Noted  . Lumbosacral spondylosis without myelopathy 02/20/2013  . Diabetic neuropathy 01/09/2013  . DM (diabetes mellitus), type 2, uncontrolled 10/02/2012  . MITRAL REGURGITATION, mild 10/31/2009  . PLEURAL EFFUSION, RIGHT after pyelonephritis in April 2011 (no intervention) 10/20/2009     Preventative Medicine:   Hemoglobin A1c    Assessment and plan: Back pain: - Followup  Dr. Starr Sinclair medicine- patient has an appointment at the end of the week for follow -Referral to sports medicine for possible spinal injections-prior to giving steroids we'll need to get sugars better controlled -MRI of the lumbo-sacral spine -Add Flexeril 5 mg 3 times a day- patient previously had this prescription but never filled it due to the expense-she now has insurance -Add Cymbalta 30 mg at bedtime  Uncontrolled type 2 diabetes mellitus A1c- 14 today States fasting sugars range anywhere from 100-300 based on how much pain she had through the night -Due to the fact that I am unaware of what her sugars have been running at various times of the day, I will not be adjusting her medications today. I have asked her to start logging her sugars and bring in the log with her on her next visit.  Unfortunately we do not have any appointments earlier than one month from now.  Debbe Odea, MD

## 2013-05-10 ENCOUNTER — Other Ambulatory Visit: Payer: Self-pay

## 2013-05-11 ENCOUNTER — Encounter: Payer: Self-pay | Admitting: Physical Medicine & Rehabilitation

## 2013-05-11 ENCOUNTER — Encounter
Payer: Managed Care, Other (non HMO) | Attending: Physical Medicine & Rehabilitation | Admitting: Physical Medicine & Rehabilitation

## 2013-05-11 VITALS — BP 167/96 | HR 85 | Resp 14 | Ht 60.0 in | Wt 174.3 lb

## 2013-05-11 DIAGNOSIS — M47817 Spondylosis without myelopathy or radiculopathy, lumbosacral region: Secondary | ICD-10-CM | POA: Insufficient documentation

## 2013-05-11 DIAGNOSIS — E1149 Type 2 diabetes mellitus with other diabetic neurological complication: Secondary | ICD-10-CM | POA: Insufficient documentation

## 2013-05-11 DIAGNOSIS — E1142 Type 2 diabetes mellitus with diabetic polyneuropathy: Secondary | ICD-10-CM | POA: Insufficient documentation

## 2013-05-11 DIAGNOSIS — E1140 Type 2 diabetes mellitus with diabetic neuropathy, unspecified: Secondary | ICD-10-CM

## 2013-05-11 MED ORDER — TRAMADOL HCL 50 MG PO TABS: 25.0000 mg | ORAL_TABLET | Freq: Two times a day (BID) | ORAL | Status: DC | PRN

## 2013-05-11 NOTE — Progress Notes (Signed)
Subjective:    Patient ID: [name] [name], female    DOB: February 02, 1966, 47 y.o.   MRN: [id number]  HPI  Mayfred is back regarding her DPN and low back pain. Her leg/foot pain has been worse, especially at night.   Lyrica was increased this week and she was started on cymbalta and flexeril yesterday. She feels that the lyrica has been helpful with sleep. The cymbalta makes her really sleepy in the morning.   Family practice ordered an MRI of her lumbar spine and was discussing a sports medicine referral.    Pain Inventory Average Pain 9 Pain Right Now 8 My pain is burning and stabbing  In the last 24 hours, has pain interfered with the following? General activity 8 Relation with others 8 Enjoyment of life 8 What TIME of day is your pain at its worst? night Sleep (in general) Poor  Pain is worse with: inactivity Pain improves with: medication Relief from Meds: 9  Mobility use a cane  Function I need assistance with the following:  household duties  Neuro/Psych numbness tingling trouble walking  Prior Studies Any changes since last visit?  no  Physicians involved in your care Any changes since last visit?  no   Family History  Problem Relation Age of Onset  . Diabetes Father   . Heart attack Father    History   Social History  . Marital Status: Married    Spouse Name: N/A    Number of Children: N/A  . Years of Education: N/A   Social History Main Topics  . Smoking status: Never Smoker   . Smokeless tobacco: Never Used  . Alcohol Use: No  . Drug Use: No  . Sexual Activity: Yes   Other Topics Concern  . None   Social History Narrative  . None   Past Surgical History  Procedure Laterality Date  . Abdominal hysterectomy    . Tubal ligation    . Tonsillectomy    . Cesarean section    . Ganglion cyst excision    . Cholecystectomy     Past Medical History  Diagnosis Date  . Diabetes mellitus    BP 167/96  Pulse 85  Resp 14  Ht 5' (1.524 m)  Wt  174 lb 4.8 oz (79.062 kg)  BMI 34.04 kg/m2  SpO2 99%     Review of Systems  Musculoskeletal: Positive for gait problem.       Generally leg pain in both   Neurological: Positive for numbness.       Tingling  All other systems reviewed and are negative.       Objective:   Physical Exam  General: Alert and oriented x 3, No apparent distress. Abdominally obese.  HEENT: Head is normocephalic, atraumatic, PERRLA, EOMI, sclera anicteric, oral mucosa pink and moist, dentition intact, ext ear canals clear,  Neck: Supple without JVD or lymphadenopathy  Heart: Reg rate and rhythm. No murmurs rubs or gallops  Chest: CTA bilaterally without wheezes, rales, or rhonchi; no distress  Abdomen: Soft, non-tender, non-distended, bowel sounds positive.  Extremities: No clubbing, cyanosis, or edema. Pulses are 2+  Skin: Clean and intact without signs of breakdown  Neuro: Pt is cognitively appropriate with normal insight, memory, and awareness. Cranial nerves 2-12 are intact. Sensory exam is 1/2 to temperature and LT from knees to ankle and trace/2 at the feet. She also has mild sensory loss at the finger tips bilaterally. Reflexes are 1+ in UE's and trace/absent in the  lowers. Fine motor coordination is intact. No tremors. Motor function is grossly 5/5 in the uppers. She is 3+ to 4/5 in the proximal LE's to 3+ distally----pain inhibitied.  Musculoskeletal: she was hypersensitive to touch in the legs bilaterally below the knees. Low back was very tender with palpation over the L5-S1 segment, right more than left. Right lumbar paraspinals were tight. She was tight in the trunk and both hamstrings with stretching. Flexion caused the most pain today, and she could only flex to about 50 degrees. SLR negative.  Psych: Pt's affect is appropriate. Pt is cooperative  Assessment & Plan:   1. Uncontrolled diabetes with neuropathy  2. Low back pain which appears to be generalized spondylosis, mild facet arthropathy.  Her DDD is mild on recent back xray. Symptoms are exacerbated by tight core muscles and prolonged standing while at work.  3. Mild obesity.  4. Gait disorder due to the above.    Plan:  1. Agree with lyrica  at 100mg  TID. Cymbalta will also help--continue 30mg  DAILY ( at night time if it's causing fatigue). 2. Continue meloxicam 7.5mg  daily  3. She is doing better with her DM control.   4. Discussed Pilates based exercises and stretches addressing posture and her low back and pelvis. She still has substantial core muscle tightness  5. Agree with MRI of lumbar spine given current symptoms. I will follow up with her regarding plan moving forward there is no need for a sports medicine referral at this time. If she needs something interventional, we can do it here.  6. Add tramadol for night time pain. She may use 25-50mg  prn.  6. Follow up with me in about a month. 30 minutes of face to face patient care time were spent during this visit. All questions were encouraged and answered.

## 2013-05-11 NOTE — Patient Instructions (Signed)
CHECK WITH YOUR PHARMACIST ABOUT COVERAGE FOR A CUSTOM COMPOUNDED CREAM FOR YOUR FEET

## 2013-05-12 LAB — VITAMIN D PNL(25-HYDRXY+1,25-DIHY)-BLD
Vit D, 25-Hydroxy: 16 ng/mL — ABNORMAL LOW (ref 30–89)
Vitamin D 1, 25 (OH)2 Total: 56 pg/mL (ref 18–72)
Vitamin D2 1, 25 (OH)2: <8 pg/mL
Vitamin D3 1, 25 (OH)2: 56 pg/mL

## 2013-05-17 ENCOUNTER — Other Ambulatory Visit: Payer: Managed Care, Other (non HMO)

## 2013-05-19 ENCOUNTER — Other Ambulatory Visit: Payer: Managed Care, Other (non HMO)

## 2013-05-19 ENCOUNTER — Inpatient Hospital Stay: Admission: RE | Admit: 2013-05-19 | Payer: Managed Care, Other (non HMO) | Source: Ambulatory Visit

## 2013-05-23 ENCOUNTER — Telehealth: Payer: Self-pay | Admitting: Internal Medicine

## 2013-06-04 ENCOUNTER — Other Ambulatory Visit: Payer: Self-pay | Admitting: Emergency Medicine

## 2013-06-04 ENCOUNTER — Telehealth: Payer: Self-pay | Admitting: Emergency Medicine

## 2013-06-04 MED ORDER — INSULIN ASPART 100 UNIT/ML ~~LOC~~ SOLN: 25.0000 [IU] | Freq: Three times a day (TID) | SUBCUTANEOUS | Status: DC

## 2013-06-04 NOTE — Progress Notes (Unsigned)
Patient came to office today 06/04/13 and was given 3 sample bottles of novolog We explained it was a one time sample to hold her over We can not give out samples every month

## 2013-06-04 NOTE — Telephone Encounter (Signed)
Pt called in requesting refill on Novolog until scheduled office visit 06/11/13. Reordered and e-scribed to [facility]

## 2013-06-11 ENCOUNTER — Ambulatory Visit: Payer: Managed Care, Other (non HMO) | Attending: Internal Medicine | Admitting: Internal Medicine

## 2013-06-11 ENCOUNTER — Other Ambulatory Visit: Payer: Self-pay | Admitting: Emergency Medicine

## 2013-06-11 VITALS — BP 148/101 | HR 86 | Temp 98.5°F | Resp 16 | Wt 180.2 lb

## 2013-06-11 DIAGNOSIS — E1149 Type 2 diabetes mellitus with other diabetic neurological complication: Secondary | ICD-10-CM

## 2013-06-11 DIAGNOSIS — E1165 Type 2 diabetes mellitus with hyperglycemia: Secondary | ICD-10-CM

## 2013-06-11 DIAGNOSIS — E1140 Type 2 diabetes mellitus with diabetic neuropathy, unspecified: Secondary | ICD-10-CM

## 2013-06-11 DIAGNOSIS — E1142 Type 2 diabetes mellitus with diabetic polyneuropathy: Secondary | ICD-10-CM

## 2013-06-11 DIAGNOSIS — M47817 Spondylosis without myelopathy or radiculopathy, lumbosacral region: Secondary | ICD-10-CM

## 2013-06-11 DIAGNOSIS — IMO0001 Reserved for inherently not codable concepts without codable children: Secondary | ICD-10-CM

## 2013-06-11 DIAGNOSIS — J069 Acute upper respiratory infection, unspecified: Secondary | ICD-10-CM | POA: Insufficient documentation

## 2013-06-11 DIAGNOSIS — M549 Dorsalgia, unspecified: Secondary | ICD-10-CM

## 2013-06-11 MED ORDER — AZITHROMYCIN 250 MG PO TABS: ORAL_TABLET | ORAL | Status: DC

## 2013-06-11 MED ORDER — GUAIFENESIN-DM 100-10 MG/5ML PO SYRP: 5.0000 mL | ORAL_SOLUTION | ORAL | Status: DC | PRN

## 2013-06-11 MED ORDER — GABAPENTIN 300 MG PO CAPS: 300.0000 mg | ORAL_CAPSULE | Freq: Three times a day (TID) | ORAL | Status: DC

## 2013-06-11 MED ORDER — INSULIN ASPART 100 UNIT/ML FLEXPEN: 25.0000 [IU] | PEN_INJECTOR | Freq: Three times a day (TID) | SUBCUTANEOUS | Status: DC

## 2013-06-11 NOTE — Progress Notes (Signed)
Patient states she is not feeling well Woke up with a cough Drenched from sweating during the night States when she breaths her back hurts

## 2013-06-11 NOTE — Patient Instructions (Signed)
Diabetic Neuropathy Diabetic neuropathy is a nerve disease or nerve damage that is caused by diabetes mellitus. About half of all people with diabetes mellitus have some form of nerve damage. Nerve damage is more common in those who have had diabetes mellitus for many years and who generally have not had good control of their blood sugar (glucose) level. Diabetic neuropathy is a common complication of diabetes mellitus. There are three more common types of diabetic neuropathy and a fourth type that is less common and less understood:   Peripheral neuropathy This is the most common type of diabetic neuropathy. It causes damage to the nerves of the feet and legs first and then eventually the hands and arms.The damage affects the ability to sense touch.  Autonomic neuropathy This type causes damage to the autonomic nervous system, which controls the following functions:  Heartbeat.  Body temperature.  Blood pressure.  Urination.  Digestion.  Sweating.  Sexual function.  Focal neuropathy Focal neuropathy can be painful and unpredictable and occurs most often in older adults with diabetes mellitus. It involves a specific nerve or one area and often comes on suddenly. It usually does not cause long-term problems.  Radiculoplexus neuropathy Sometimes called lumbosacral radiculoplexus neuropathy, radiculoplexus neuropathy affects the nerves of the thighs, hips, buttocks, or legs. It is more common in people with type 2 diabetes mellitus and in older men. It is characterized by debilitating pain, weakness, and atrophy, usually in the thigh muscles. CAUSES  The cause of peripheral, autonomic, and focal neuropathies is diabetes mellitus that is uncontrolled and high glucose levels. The cause of radiculoplexus neuropathy is unknown. However, it is thought to be caused by inflammation related to uncontrolled glucose levels. SIGNS AND SYMPTOMS  Peripheral Neuropathy Peripheral neuropathy develops  slowly over time. When the nerves of the feet and legs no longer work there may be:   Burning, stabbing, or aching pain in the legs or feet.  Inability to feel pressure or pain in your feet. This can lead to:  Thick calluses over pressure areas.  Pressure sores.  Ulcers.  Foot deformities.  Reduced ability to feel temperature changes.  Muscle weakness. Autonomic Neuropathy The symptoms of autonomic neuropathy vary depending on which nerves are affected. Symptoms may include:  Problems with digestion, such as:  Feeling sick to your stomach (nausea).  Vomiting.  Bloating.  Constipation.  Diarrhea.  Abdominal pain.  Difficulty with urination. This occurs if you lose your ability to sense when your bladder is full. Problems include:  Urine leakage (incontinence).  Inability to empty your bladder completely (retention).  Rapid or irregular heartbeat (palpitations).  Blood pressure drops when you stand up (orthostatic hypotension). When you stand up you may feel:  Dizzy.  Weak.  Faint.  In men, inability to attain and maintain an erection.  In women, vaginal dryness and problems with decreased sexual desire and arousal.  Problems with body temperature regulation.  Increased or decreased sweating. Focal Neuropathy  Abnormal eye movements or abnormal alignment of both eyes.  Weakness in the wrist.  Foot drop. This results in an inability to lift the foot properly and abnormal walking or foot movement.  Paralysis on one side of your face (Bell palsy).  Chest or abdominal pain. Radiculoplexus Neuropathy  Sudden, severe pain in your hip, thigh, or buttocks.  Weakness and wasting of thigh muscles.  Difficulty rising from a seated position.  Abdominal swelling.  Unexplained weight loss (usually more than 10 lb [4.5 kg]). DIAGNOSIS  Peripheral Neuropathy   Your senses may be tested. Sensory function testing can be done with:  A light touch using a  monofilament.  A vibration with tuning fork.  A sharp sensation with a pin prick Other tests that can help diagnose neuropathy are:  Nerve conduction velocity. This test checks the transmission of an electrical current through a nerve.  Electromyography. This shows how muscles respond to electrical signals transmitted by nearby nerves.  Quantitative sensory testing. This is used to assess how your nerves respond to vibrations and changes in temperature. Autonomic Neuropathy Diagnosis is often based on reported symptoms. Tell your health care provider if you experience:   Dizziness.   Constipation.   Diarrhea.   Inappropriate urination or inability to urinate.   Inability to get or maintain an erection.  Tests that may be done include:   Electrocardiography or Holter monitor. These are tests that can help show problems with the heart rate or heart rhythm.   An X-ray exam may be done. Focal Neuropathy Diagnosis is made based on your symptoms and what your health care provider finds during your exam. Other tests may be done. They may include:  Nerve conduction velocities. This checks the transmission of electrical current through a nerve.  Electromyography. This shows how muscles respond to electrical signals transmitted by nearby nerves.  Quantitative sensory testing. This test is used to assess how your nerves respond to vibration and changes in temperature. Radiculoplexus Neuropathy  Often the first thing is to eliminate any other issue or problems that might be the cause, as there is no stick test for diagnosis.  X-ray exam of your spine and lumbar region.  Spinal tap to rule out cancer.  MRI to rule out other lesions. TREATMENT  Once nerve damage occurs it cannot be reversed. The goal of treatment is to keep the disease or nerve damage from getting worse and affecting more nerve fibers. Controlling your blood glucose level is the key. Most people with  radiculoplexus neuropathy see at least a partial improvement over time. You will need to keep your blood glucose and HbA1c levels in the target range determined by your health care provider. Things that help control blood glucose levels include:   Blood glucose monitoring.   Meal planning.   Physical activity.   Diabetes medicine.  Over time, maintaining lower blood glucose levels helps lessen symptoms. Sometimes, prescription pain medicine is needed. HOME CARE INSTRUCTIONS:  Do not smoke.  Keep your blood glucose level in the range that you and your health care provider have determined acceptable for you.  Keep your blood pressure level in the range that you and your health care provider have determined acceptable for you.  Eat a well-balanced diet.  Be active every day.  Check your feet every day. SEEK MEDICAL CARE IF:   You have burning, stabbing, or aching pain in the legs or feet.  You are unable to feel pressure or pain in your feet.  You develop problems with digestion such as:  Nausea.  Vomiting.  Bloating.  Constipation.  Diarrhea.  Abdominal pain.  You have difficulty with urination, such as:  Incontinence.  Retention.  You have palpitations.  You develop orthostatic hypotension. When you stand up you may feel:  Dizzy.  Weak.  Faint.  You cannot attain and maintain an erection (in men).  You have vaginal dryness and problems with decreased sexual desire and arousal (in women).  You have severe pain in your thighs, legs, or buttocks.  You have  unexplained weight loss. Document Released: 08/30/2001 Document Revised: 02/21/2013 Document Reviewed: 11/30/2012 [facility] Patient Information 2014 [place].

## 2013-06-12 ENCOUNTER — Telehealth: Payer: Self-pay | Admitting: *Deleted

## 2013-06-12 NOTE — Telephone Encounter (Signed)
Called to cancel appt for 06/15/13 with RN since she is on no CII medications and has a refill  On her tramadol.  She is in agreement with this.  We will schedule a follow up with Dr Naaman Plummer the following month.

## 2013-06-13 ENCOUNTER — Inpatient Hospital Stay: Admission: RE | Admit: 2013-06-13 | Payer: Managed Care, Other (non HMO) | Source: Ambulatory Visit

## 2013-06-13 ENCOUNTER — Ambulatory Visit
Admission: RE | Admit: 2013-06-13 | Discharge: 2013-06-13 | Disposition: A | Discharge status: Home / Self Care | Payer: Managed Care, Other (non HMO) | Source: Ambulatory Visit | Attending: Internal Medicine | Admitting: Internal Medicine

## 2013-06-13 DIAGNOSIS — J069 Acute upper respiratory infection, unspecified: Secondary | ICD-10-CM

## 2013-06-13 NOTE — Progress Notes (Signed)
Patient ID: [name] [name], female   DOB: 07-02-1966, 47 y.o.   MRN: [id number] Patient Demographics  [name] [name], is a 47 y.o. female  [id number]  [id number]  DOB - January 06, 1966  Chief Complaint  Patient presents with  . Cough  . Nasal Congestion        Subjective:   [name] [name] is a 47 y.o. female here today for a follow up visit. Patient complaining of cough productive of yellowish sputum with this side pain while coughing, she also has nasal congestion and subjective fever. Denies chest pain. She is also complaining of generalized body pain but mostly low back. There is no contact with patient who has chronic cough. Patient claimed her blood sugar is controlled at home as well as her blood pressure. She claims to be compliant with medication now, better than before. She does not smoke cigarette, she does not drink alcohol. Patient has No headache, No chest pain, No abdominal pain - No Nausea, No new weakness tingling or numbness, No Cough - SOB.  ALLERGIES: No Known Allergies  PAST MEDICAL HISTORY: Past Medical History  Diagnosis Date  . Diabetes mellitus     MEDICATIONS AT HOME: Prior to Admission medications   Medication Sig Start Date End Date Taking? Authorizing Provider  acetaminophen (TYLENOL) 500 MG tablet Take 1 tablet (500 mg total) by mouth every 6 (six) hours as needed for pain. 03/27/13   Shanker Kristeen Mans, MD  aspirin EC 81 MG tablet Take 81 mg by mouth every morning.    Historical Provider, MD  azithromycin (ZITHROMAX Z-PAK) 250 MG tablet Take 2 now then 1 tab daily for 4 days from tomorrow 06/11/13   Angelica Chessman, MD  cyclobenzaprine (FLEXERIL) 5 MG tablet Take 1 tablet (5 mg total) by mouth 3 (three) times daily as needed for muscle spasms. 05/09/13   Debbe Odea, MD  DULoxetine (CYMBALTA) 30 MG capsule Take 1 capsule (30 mg total) by mouth daily. 05/09/13   Debbe Odea, MD  fish oil-omega-3 fatty acids 1000 MG capsule Take 2 capsules (2 g total) by  mouth daily. 01/09/13   Robbie Lis, MD  gabapentin (NEURONTIN) 300 MG capsule Take 1 capsule (300 mg total) by mouth 3 (three) times daily. 06/11/13   Angelica Chessman, MD  glucose blood test strip Use as instructed 03/27/13   Jonetta Osgood, MD  guaiFENesin-dextromethorphan (ROBITUSSIN DM) 100-10 MG/5ML syrup Take 5 mLs by mouth every 4 (four) hours as needed for cough. 06/11/13   Angelica Chessman, MD  insulin aspart (NOVOLOG FLEXPEN) 100 UNIT/ML SOPN FlexPen Inject 25 Units into the skin 3 (three) times daily with meals. 06/11/13   Angelica Chessman, MD  insulin glargine (LANTUS) 100 UNIT/ML injection Inject 0.4 mLs (40 Units total) into the skin at bedtime. 02/02/13   Philemon Kingdom, MD  meloxicam (MOBIC) 7.5 MG tablet Take 1 tablet (7.5 mg total) by mouth daily. 04/02/13   Meredith Staggers, MD  metFORMIN (GLUCOPHAGE) 500 MG tablet Take 500 mg by mouth 2 (two) times daily with a meal. Take daily for 7 days, and then increase to twice daily. 03/27/13   Shanker Kristeen Mans, MD  methocarbamol (ROBAXIN) 500 MG tablet Take 1 tablet (500 mg total) by mouth every 6 (six) hours as needed. 04/09/13   Idalia Needle. [place], PA-C  mupirocin ointment (BACTROBAN) 2 % Apply topically 3 (three) times daily. 02/16/13   Harden Mo, MD  pregabalin (LYRICA) 100 MG capsule Take 1 capsule (100 mg total) by  mouth 3 (three) times daily after meals. 05/07/13   Billy Fischer, MD  traMADol (ULTRAM) 50 MG tablet Take 0.5-1 tablets (25-50 mg total) by mouth every 12 (twelve) hours as needed. 05/11/13   Meredith Staggers, MD     Objective:   Filed Vitals:   06/11/13 1551  BP: 148/101  Pulse: 86  Temp: 98.5 F (36.9 C)  Resp: 16  Weight: 180 lb 3.2 oz (81.738 kg)  SpO2: 100%    Exam General appearance : Awake, alert, not in any distress. Speech Clear. Not toxic looking HEENT: Atraumatic and Normocephalic, pupils equally reactive to light and accomodation Neck: supple, no JVD. No cervical lymphadenopathy.  Chest:Good  air entry bilaterally, no added sounds  CVS: S1 S2 regular, no murmurs.  Abdomen: Bowel sounds present, Non tender and not distended with no gaurding, rigidity or rebound. Extremities: B/L Lower Ext shows no edema, both legs are warm to touch Neurology: Awake alert, and oriented X 3, CN II-XII intact, Non focal Skin:No Rash Wounds:N/A   Data Review   CBC No results found for this basename: WBC, HGB, HCT, PLT, MCV, MCH, MCHC, RDW, NEUTRABS, LYMPHSABS, MONOABS, EOSABS, BASOSABS, BANDABS, BANDSABD,  in the last 168 hours  Chemistries   No results found for this basename: NA, K, CL, CO2, GLUCOSE, BUN, CREATININE, GFRCGP, CALCIUM, MG, AST, ALT, ALKPHOS, BILITOT,  in the last 168 hours ------------------------------------------------------------------------------------------------------------------ No results found for this basename: HGBA1C,  in the last 72 hours ------------------------------------------------------------------------------------------------------------------ No results found for this basename: CHOL, HDL, LDLCALC, TRIG, CHOLHDL, LDLDIRECT,  in the last 72 hours ------------------------------------------------------------------------------------------------------------------ No results found for this basename: TSH, T4TOTAL, FREET3, T3FREE, THYROIDAB,  in the last 72 hours ------------------------------------------------------------------------------------------------------------------ No results found for this basename: VITAMINB12, FOLATE, FERRITIN, TIBC, IRON, RETICCTPCT,  in the last 72 hours  Coagulation profile  No results found for this basename: INR, PROTIME,  in the last 168 hours    Assessment & Plan   1. DM (diabetes mellitus), type 2, uncontrolled Continue - insulin aspart (NOVOLOG FLEXPEN) 100 UNIT/ML SOPN FlexPen; Inject 25 Units into the skin 3 (three) times daily with meals.  Dispense: 10 pen; Refill: 6  2. Back pain Patient counseled extensively about  pain  3. Diabetic neuropathy  - gabapentin (NEURONTIN) 300 MG capsule; Take 1 capsule (300 mg total) by mouth 3 (three) times daily.  Dispense: 180 capsule; Refill: 3  4. Lumbosacral spondylosis without myelopathy Reorder - MR Lumbar Spine Wo Contrast; Future  5. Acute upper respiratory infections of unspecified site Prescribed: - azithromycin (ZITHROMAX Z-PAK) 250 MG tablet; Take 2 now then 1 tab daily for 4 days from tomorrow  Dispense: 6 each; Refill: 0 - guaiFENesin-dextromethorphan (ROBITUSSIN DM) 100-10 MG/5ML syrup; Take 5 mLs by mouth every 4 (four) hours as needed for cough.  Dispense: 118 mL; Refill: 0 - DG Chest 2 View; Future  Follow up in 3 months or when necessary   The patient was given clear instructions to go to ER or return to medical center if symptoms don't improve, worsen or new problems develop. The patient verbalized understanding. The patient was told to call to get lab results if they haven't heard anything in the next week.    Angelica Chessman, MD, [place], [place], [place] and [place], [place]   06/13/2013, 3:08 PM

## 2013-06-14 ENCOUNTER — Telehealth: Payer: Self-pay

## 2013-06-14 ENCOUNTER — Telehealth: Payer: Self-pay | Admitting: Internal Medicine

## 2013-06-14 NOTE — Telephone Encounter (Signed)
Patient is aware of her chest xray results

## 2013-06-14 NOTE — Telephone Encounter (Signed)
Pt calling about results for x-ray taken on 06/13/13. Please f/u with pt.

## 2013-06-15 ENCOUNTER — Encounter: Payer: Managed Care, Other (non HMO) | Admitting: Physical Medicine & Rehabilitation

## 2013-06-15 ENCOUNTER — Telehealth: Payer: Self-pay | Admitting: *Deleted

## 2013-06-15 NOTE — Telephone Encounter (Signed)
I spoke to pt and told her that her chest x ray came back normal.

## 2013-06-15 NOTE — Telephone Encounter (Signed)
Message copied by Joan Mayans on Fri Jun 15, 2013  2:14 PM ------      Message from: Angelica Chessman E      Created: Thu Jun 14, 2013  3:53 PM       Please inform patient that her chest x-ray is normal ------

## 2013-06-17 ENCOUNTER — Other Ambulatory Visit: Payer: Managed Care, Other (non HMO)

## 2013-06-21 ENCOUNTER — Ambulatory Visit
Admission: RE | Admit: 2013-06-21 | Discharge: 2013-06-21 | Disposition: A | Discharge status: Home / Self Care | Payer: Managed Care, Other (non HMO) | Source: Ambulatory Visit | Attending: Internal Medicine | Admitting: Internal Medicine

## 2013-06-21 DIAGNOSIS — M47817 Spondylosis without myelopathy or radiculopathy, lumbosacral region: Secondary | ICD-10-CM

## 2013-06-24 ENCOUNTER — Emergency Department (HOSPITAL_COMMUNITY)
Admission: EM | Admit: 2013-06-24 | Discharge: 2013-06-24 | Disposition: A | Discharge status: Home / Self Care | Payer: Managed Care, Other (non HMO) | Attending: Emergency Medicine | Admitting: Emergency Medicine

## 2013-06-24 ENCOUNTER — Encounter (HOSPITAL_COMMUNITY): Payer: Self-pay | Admitting: Emergency Medicine

## 2013-06-24 DIAGNOSIS — E119 Type 2 diabetes mellitus without complications: Secondary | ICD-10-CM | POA: Insufficient documentation

## 2013-06-24 DIAGNOSIS — N390 Urinary tract infection, site not specified: Secondary | ICD-10-CM | POA: Insufficient documentation

## 2013-06-24 DIAGNOSIS — Z79899 Other long term (current) drug therapy: Secondary | ICD-10-CM | POA: Insufficient documentation

## 2013-06-24 DIAGNOSIS — M549 Dorsalgia, unspecified: Secondary | ICD-10-CM

## 2013-06-24 DIAGNOSIS — Z792 Long term (current) use of antibiotics: Secondary | ICD-10-CM | POA: Insufficient documentation

## 2013-06-24 DIAGNOSIS — Z90710 Acquired absence of both cervix and uterus: Secondary | ICD-10-CM | POA: Insufficient documentation

## 2013-06-24 DIAGNOSIS — Z791 Long term (current) use of non-steroidal anti-inflammatories (NSAID): Secondary | ICD-10-CM | POA: Insufficient documentation

## 2013-06-24 DIAGNOSIS — Z7982 Long term (current) use of aspirin: Secondary | ICD-10-CM | POA: Insufficient documentation

## 2013-06-24 DIAGNOSIS — R011 Cardiac murmur, unspecified: Secondary | ICD-10-CM | POA: Insufficient documentation

## 2013-06-24 DIAGNOSIS — Z9851 Tubal ligation status: Secondary | ICD-10-CM | POA: Insufficient documentation

## 2013-06-24 DIAGNOSIS — Z9089 Acquired absence of other organs: Secondary | ICD-10-CM | POA: Insufficient documentation

## 2013-06-24 DIAGNOSIS — Z3202 Encounter for pregnancy test, result negative: Secondary | ICD-10-CM | POA: Insufficient documentation

## 2013-06-24 DIAGNOSIS — Z794 Long term (current) use of insulin: Secondary | ICD-10-CM | POA: Insufficient documentation

## 2013-06-24 LAB — URINALYSIS, ROUTINE W REFLEX MICROSCOPIC
Bilirubin Urine: NEGATIVE
Glucose, UA: >1000 mg/dL — AB
Ketones, ur: NEGATIVE mg/dL
Nitrite: NEGATIVE
Protein, ur: 30 mg/dL — AB
Specific Gravity, Urine: 1.035 — ABNORMAL HIGH (ref 1.005–1.030)
Urobilinogen, UA: 0.2 mg/dL (ref 0.0–1.0)
pH: 5.5 (ref 5.0–8.0)

## 2013-06-24 LAB — URINE MICROSCOPIC-ADD ON

## 2013-06-24 LAB — GLUCOSE, CAPILLARY: Glucose-Capillary: 394 mg/dL — ABNORMAL HIGH (ref 70–99)

## 2013-06-24 LAB — PREGNANCY, URINE: Preg Test, Ur: NEGATIVE

## 2013-06-24 MED ORDER — CEPHALEXIN 500 MG PO CAPS: 500.0000 mg | ORAL_CAPSULE | Freq: Two times a day (BID) | ORAL | Status: DC

## 2013-06-24 MED ORDER — OXYCODONE-ACETAMINOPHEN 5-325 MG PO TABS
1.0000 | ORAL_TABLET | Freq: Once | ORAL | Status: AC
  Administered 2013-06-24: 1 via ORAL
  Filled 2013-06-24: qty 1

## 2013-06-24 MED ORDER — CYCLOBENZAPRINE HCL 5 MG PO TABS: 5.0000 mg | ORAL_TABLET | Freq: Three times a day (TID) | ORAL | Status: DC | PRN

## 2013-06-24 MED ORDER — NAPROXEN 500 MG PO TABS: 500.0000 mg | ORAL_TABLET | Freq: Two times a day (BID) | ORAL | Status: DC

## 2013-06-24 NOTE — ED Provider Notes (Signed)
CSN: [id number]     Arrival date & time 06/24/13  1005 History  This chart was scribed for non-physician practitioner, Vernie Murders, PA-C working with Jasper Riling. Alvino Chapel, MD by Frederich Balding, ED scribe. This patient was seen in room TR08C/TR08C and the patient's care was started at 11:25 AM.   Chief Complaint  Patient presents with  . Back Pain   The history is provided by the patient. No language interpreter was used.   HPI Comments: [name] [name] is a 47 y.o. female with a PMH of DM who presents to the Emergency Department complaining of gradual onset, constant sharp lower back pain that radiates into bilateral lower extremities that worsened one week ago.  Pt has had this back pain for one year with no acute changes.  Denies injury. She had an MRI done 3 days ago but does not have the results yet. Nothing makes the pain better or worse. Pt goes to pain management and is given lyrica and muscle relaxers with no relief of pain. Denies fever, rhinorrhea, cough, chest pain, trouble breathing, dysuria, hematuria, vaginal bleeding, vaginal discharge, numbness, tingling, weakness, bowel or bladder incontinence. Pt denies history of blood clots.    Past Medical History  Diagnosis Date  . Diabetes mellitus    Past Surgical History  Procedure Laterality Date  . Abdominal hysterectomy    . Tubal ligation    . Tonsillectomy    . Cesarean section    . Ganglion cyst excision    . Cholecystectomy    . Eye surgery Left retinal   Family History  Problem Relation Age of Onset  . Diabetes Father   . Heart attack Father    History  Substance Use Topics  . Smoking status: Never Smoker   . Smokeless tobacco: Never Used  . Alcohol Use: No   OB History   Grav Para Term Preterm Abortions TAB SAB Ect Mult Living                 Review of Systems  Constitutional: Negative for fever, chills, diaphoresis, activity change, appetite change and fatigue.  HENT: Negative for rhinorrhea.    Respiratory: Negative for cough.   Cardiovascular: Negative for chest pain.  Gastrointestinal: Negative for nausea, vomiting, abdominal pain, diarrhea and constipation.  Genitourinary: Negative for dysuria, hematuria, vaginal bleeding and vaginal discharge.       Negative for bowel or bladder incontinence.   Musculoskeletal: Positive for back pain. Negative for myalgias and neck pain.  Neurological: Negative for weakness, numbness and headaches.  All other systems reviewed and are negative.    Allergies  Review of patient's allergies indicates no known allergies.  Home Medications   Current Outpatient Rx  Name  Route  Sig  Dispense  Refill  . acetaminophen (TYLENOL) 500 MG tablet   Oral   Take 1 tablet (500 mg total) by mouth every 6 (six) hours as needed for pain.   30 tablet   0   . aspirin EC 81 MG tablet   Oral   Take 81 mg by mouth every morning.         Marland Kitchen azithromycin (ZITHROMAX Z-PAK) 250 MG tablet      Take 2 now then 1 tab daily for 4 days from tomorrow   6 each   0   . cyclobenzaprine (FLEXERIL) 5 MG tablet   Oral   Take 1 tablet (5 mg total) by mouth 3 (three) times daily as needed for muscle spasms.  30 tablet   3   . DULoxetine (CYMBALTA) 30 MG capsule   Oral   Take 1 capsule (30 mg total) by mouth daily.   30 capsule   3   . fish oil-omega-3 fatty acids 1000 MG capsule   Oral   Take 2 capsules (2 g total) by mouth daily.   30 capsule   3   . gabapentin (NEURONTIN) 300 MG capsule   Oral   Take 1 capsule (300 mg total) by mouth 3 (three) times daily.   180 capsule   3   . glucose blood test strip      Use as instructed   100 each   12   . guaiFENesin-dextromethorphan (ROBITUSSIN DM) 100-10 MG/5ML syrup   Oral   Take 5 mLs by mouth every 4 (four) hours as needed for cough.   118 mL   0   . insulin aspart (NOVOLOG FLEXPEN) 100 UNIT/ML SOPN FlexPen   Subcutaneous   Inject 25 Units into the skin 3 (three) times daily with meals.    10 pen   6   . insulin glargine (LANTUS) 100 UNIT/ML injection   Subcutaneous   Inject 0.4 mLs (40 Units total) into the skin at bedtime.   30 mL   3   . meloxicam (MOBIC) 7.5 MG tablet   Oral   Take 1 tablet (7.5 mg total) by mouth daily.   30 tablet   0   . metFORMIN (GLUCOPHAGE) 500 MG tablet   Oral   Take 500 mg by mouth 2 (two) times daily with a meal. Take daily for 7 days, and then increase to twice daily.         . methocarbamol (ROBAXIN) 500 MG tablet   Oral   Take 1 tablet (500 mg total) by mouth every 6 (six) hours as needed.   75 tablet   3   . mupirocin ointment (BACTROBAN) 2 %   Topical   Apply topically 3 (three) times daily.   22 g   0   . pregabalin (LYRICA) 100 MG capsule   Oral   Take 1 capsule (100 mg total) by mouth 3 (three) times daily after meals.   90 capsule   1   . traMADol (ULTRAM) 50 MG tablet   Oral   Take 0.5-1 tablets (25-50 mg total) by mouth every 12 (twelve) hours as needed.   30 tablet   1    BP 155/102  Pulse 91  Temp(Src) 98.9 F (37.2 C) (Oral)  Resp 18  Ht 5' (1.524 m)  Wt 170 lb (77.111 kg)  BMI 33.20 kg/m2  SpO2 100%  Filed Vitals:   06/24/13 1018 06/24/13 1256  BP: 155/102 149/98  Pulse: 91 77  Temp: 98.9 F (37.2 C) 98.1 F (36.7 C)  TempSrc: Oral Oral  Resp: 18 15  Height: 5' (1.524 m)   Weight: 170 lb (77.111 kg)   SpO2: 100% 100%    Physical Exam  Nursing note and vitals reviewed. Constitutional: She is oriented to person, place, and time. She appears well-developed and well-nourished. No distress.  HENT:  Head: Normocephalic and atraumatic.  Right Ear: External ear normal.  Left Ear: External ear normal.  Mouth/Throat: Oropharynx is clear and moist.  Eyes: Conjunctivae and EOM are normal. Right eye exhibits no discharge. Left eye exhibits no discharge.  Neck: Normal range of motion. Neck supple. No tracheal deviation present.  No cervical spinal or paraspinal tenderness to palpation  throughout.  No limitations with neck ROM.    Cardiovascular: Normal rate and regular rhythm.  Exam reveals no gallop and no friction rub.   Murmur heard. Pulses:      Dorsalis pedis pulses are 2+ on the right side, and 2+ on the left side.  Pulmonary/Chest: Effort normal and breath sounds normal. No respiratory distress. She has no wheezes. She has no rhonchi. She has no rales. She exhibits no tenderness.  Abdominal: Soft. She exhibits no distension and no mass. There is no tenderness. There is no rebound and no guarding.  Musculoskeletal: Normal range of motion. She exhibits tenderness. She exhibits no edema.       Arms: Tenderness to palpation to the lower lumbar paraspinal muscles.  Positive straight leg raise bilaterally. No tenderness to palpation to the thoracic or lumbar spinous processes throughout.  Strength 5/5 in the upper and lower extremities bilaterally.  No pedal edema or calf tenderness bilaterally.  Patient able to ambulate without difficulty or ataxia.    Neurological: She is alert and oriented to person, place, and time.  Reflex Scores:      Achilles reflexes are 2+ on the right side and 2+ on the left side. Skin: Skin is warm and dry. She is not diaphoretic.  Psychiatric: She has a normal mood and affect. Her behavior is normal.    ED Course  Procedures (including critical care time)  DIAGNOSTIC STUDIES: Oxygen Saturation is 100% on RA, normal by my interpretation.    COORDINATION OF CARE: 11:34 AM-Discussed treatment plan which includes UA with pt at bedside and pt agreed to plan.   Labs Review Labs Reviewed  URINALYSIS, ROUTINE W REFLEX MICROSCOPIC - Abnormal; Notable for the following:    APPearance TURBID (*)    Specific Gravity, Urine 1.035 (*)    Glucose, UA >1000 (*)    Hgb urine dipstick MODERATE (*)    Protein, ur 30 (*)    Leukocytes, UA MODERATE (*)    All other components within normal limits  URINE MICROSCOPIC-ADD ON - Abnormal; Notable for the  following:    Bacteria, UA MANY (*)    All other components within normal limits  URINE CULTURE  PREGNANCY, URINE   Imaging Review No results found.  EKG Interpretation   None      URINALYSIS, ROUTINE W REFLEX MICROSCOPIC      Result Value Range   Color, Urine YELLOW  YELLOW   APPearance TURBID (*) CLEAR   Specific Gravity, Urine 1.035 (*) 1.005 - 1.030   pH 5.5  5.0 - 8.0   Glucose, UA >1000 (*) NEGATIVE mg/dL   Hgb urine dipstick MODERATE (*) NEGATIVE   Bilirubin Urine NEGATIVE  NEGATIVE   Ketones, ur NEGATIVE  NEGATIVE mg/dL   Protein, ur 30 (*) NEGATIVE mg/dL   Urobilinogen, UA 0.2  0.0 - 1.0 mg/dL   Nitrite NEGATIVE  NEGATIVE   Leukocytes, UA MODERATE (*) NEGATIVE  PREGNANCY, URINE      Result Value Range   Preg Test, Ur NEGATIVE  NEGATIVE  URINE MICROSCOPIC-ADD ON      Result Value Range   Squamous Epithelial / LPF RARE  RARE   WBC, UA TOO NUMEROUS TO COUNT  <3 WBC/hpf   RBC / HPF 3-6  <3 RBC/hpf   Bacteria, UA MANY (*) RARE  GLUCOSE, CAPILLARY      Result Value Range   Glucose-Capillary 394 (*) 70 - 99 mg/dL   06/21/13 MRI LUMBAR SPINE WITHOUT CONTRAST  FINDINGS:  Normal lumbar segmentation depicted on comparison. Stable and normal  vertebral height and alignment. No marrow edema or evidence of acute  osseous abnormality.  Visualized lower thoracic spinal cord is normal with conus medularis  at L1-L2. The bladder appears thick walled. Otherwise negative  visualized abdominal and pelvic viscera. Negative paraspinal soft  tissues.  T11-T12: Negative.  T12-L1: Negative.  L1-L2: Negative.  L2-L3: Negative.  L3-L4: Negative.  L4-L5: Disc desiccation. Small central disc extrusion, broad-based  but with both mild cephalad and caudal extension of disc. Descending  L5 nerve roots in the left lateral recess are effaced. No spinal  stenosis or foraminal involvement.  L5-S1: Mild disc desiccation. Small central to left paracentral  annular fissure and disc  protrusion. Disc is in proximity of the  descending L5 nerve roots, but no nerve root displacement occurs.  Mild superimposed epidural lipomatosis at this level resulting in  mild spinal stenosis.  IMPRESSION:  1. L4-L5 and L5-S1 small disc herniations, mostly affecting the  lateral recesses at both levels, and more so the left. No  significant spinal or foraminal stenosis occurs.  2. Suggestion of bladder wall thickening, the etiology and  significance of which is unclear.  Electronically Signed  By: Lars Pinks M.D.  On: 06/21/2013 10:05  MDM   NICOLLY HAMID is a 47 y.o. female  with a PMH of DM who presents to the Emergency Department complaining of gradual onset, constant sharp lower back pain that radiates into bilateral lower extremities that worsened one week ago.   Rechecks  1:00 PM = Patient states did not take insulin today.  Will take when she gets home.  Pain improved after percocet.     Back pain possibly due to lumbar disc herniations seen on MRI on 06/21/13 vs UTI.  Patient neurovascularly intact.  She had improvements in her symptoms with Percocet.  Will tx UTI with keflex and send urine for culture.  Glucose elevated in the ED.  No ketones in the urine.  Instructed patient to take insulin when she gets home.  Patient has follow-up regarding her MRI with her PCP and also has a pain management specialist.  Will prescribe naprosyn and flexeril.  Return precautions, discharge instructions, and follow-up was discussed with the patient before discharge.  Significant other in ED to drive patient home.      Discharge Medication List as of 06/24/2013  1:03 PM    START taking these medications   Details  cephALEXin (KEFLEX) 500 MG capsule Take 1 capsule (500 mg total) by mouth 2 (two) times daily., Starting 06/24/2013, Until Discontinued, Print    !! cyclobenzaprine (FLEXERIL) 5 MG tablet Take 1 tablet (5 mg total) by mouth 3 (three) times daily as needed for muscle spasms.,  Starting 06/24/2013, Until Discontinued, Print    naproxen (NAPROSYN) 500 MG tablet Take 1 tablet (500 mg total) by mouth 2 (two) times daily with a meal., Starting 06/24/2013, Until Discontinued, Print     !! - Potential duplicate medications found. Please discuss with provider.       Final impressions: 1. Back pain   2. UTI (urinary tract infection)      Mercy Moore PA-C   This patient was discussed with Dr. Alvino Chapel   I personally performed the services described in this documentation, which was scribed in my presence. The recorded information has been reviewed and is accurate.       Lucila Maine, PA-C 06/29/13 1154

## 2013-06-24 NOTE — ED Notes (Signed)
Pt c/o low back pain x 1 week. Pain has increased past couple of days. Pt had MRI done Thursday but has not had results. Pain radiates down B/L legs. Pain causes pt to be restless. Pt tried lyrica at 0600 without relief.

## 2013-06-25 ENCOUNTER — Telehealth: Payer: Self-pay | Admitting: *Deleted

## 2013-06-25 NOTE — Telephone Encounter (Signed)
I spoke to the pt and informed her about the findings of her MRI.

## 2013-06-25 NOTE — Telephone Encounter (Signed)
Message copied by Joan Mayans on Mon Jun 25, 2013  3:03 PM ------      Message from: Tresa Garter      Created: Fri Jun 22, 2013  2:45 PM       Please call patient to let her know that her MRI shows a very small disc herniations at the low back, there is no significant nerve root compression, no fracture or dislocation. We recommend continue pain medication and physical exercise. ------

## 2013-07-02 LAB — URINE CULTURE: Colony Count: >=100000

## 2013-07-02 NOTE — ED Provider Notes (Signed)
Medical screening examination/treatment/procedure(s) were performed by non-physician practitioner and as supervising physician I was immediately available for consultation/collaboration.  EKG Interpretation   None        Jasper Riling. Alvino Chapel, MD 07/02/13 [phone]

## 2013-07-03 NOTE — ED Notes (Signed)
Post ED Visit - Positive Culture Follow-up  Culture report reviewed by antimicrobial stewardship pharmacist: []  Wes Dulaney, Pharm.D., BCPS []  Heide Guile, Pharm.D., BCPS []  Alycia Rossetti, Pharm.D., BCPS []  [place], [place].D., BCPS, AAHIVP []  Legrand Como, Pharm.D., BCPS, AAHIVP [x]  Hildred Laser, Pharm.D., BCPS  Positive urine culture Treated with Keflex, organism sensitive to the same and no further patient follow-up is required at this time.  Myrna Blazer 07/03/2013, 11:39 AM

## 2013-07-11 ENCOUNTER — Encounter: Payer: Self-pay | Admitting: Sports Medicine

## 2013-07-11 ENCOUNTER — Ambulatory Visit (INDEPENDENT_AMBULATORY_CARE_PROVIDER_SITE_OTHER): Payer: Managed Care, Other (non HMO) | Admitting: Sports Medicine

## 2013-07-11 VITALS — BP 153/100 | HR 84 | Ht 60.0 in | Wt 180.0 lb

## 2013-07-11 DIAGNOSIS — M5416 Radiculopathy, lumbar region: Secondary | ICD-10-CM

## 2013-07-11 DIAGNOSIS — IMO0002 Reserved for concepts with insufficient information to code with codable children: Secondary | ICD-10-CM

## 2013-07-11 MED ORDER — ACETAMINOPHEN-CODEINE #3 300-30 MG PO TABS: 1.0000 | ORAL_TABLET | Freq: Three times a day (TID) | ORAL | Status: DC | PRN

## 2013-07-11 NOTE — Progress Notes (Signed)
   Subjective:    Patient ID: [name] [name], female    DOB: 12/15/1965, 48 y.o.   MRN: [name]  HPI chief complaint: Low back pain  48 year old female comes in today complaining of one year of low back pain. Pain initially began in her left leg. It began in her left thigh with radiating discomfort into the medial lower leg. 6 months later she began to develop back pain which has grown increasingly worse. She describes it as an aching discomfort that is worse with sitting and improves some with standing. She has a history of a prior lumbar disc herniation which improved with chiropractic treatment in 2001. She is experiencing similar pain in the right leg as well although not as severe as in the left. She recently saw her primary care physician who ordered an MRI of her lumbar spine. It is available for review. She is currently taking tramadol, Lyrica, and naproxen. Up until recently these medicines were effective, especially the Lyrica, but over the past several days it has not been very effective. No deep-seated groin pain. Only numbness she is experiencing is at the bottom of each feet but she does have a history of long-standing uncontrolled diabetes. No prior low back surgery. No change in bowel bladder habits.  Past medical history is reviewed Medications are reviewed Allergies are reviewed Social history reviewed    Review of Systems As above    Objective:   Physical Exam Well-developed, well-nourished. Some distress due to to her low back pain.  Limited lumbar range of motion secondary to pain. Equivocal straight leg raise on the left. Negative on the right. 4/5 strength with resisted great toe extension on the left compared to 5/5 on the right. Remainder of her strength is 5/5 bilaterally. No atrophy. Reflexes are trace but equal at the Achilles and patellar tendons. Sensation is grossly intact to light-touch.  MRI of her lumbar spine done 06/21/2013 is reviewed. There are small  lumbar disc herniations at L4-L5 and L5-S1 the descending L5 nerve root in the left lateral recess is effaced. No significant spinal stenosis seen.       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 Continue with Lyrica and naproxen sodium. Given her uncontrolled diabetes I am a little uncomfortable ordering lumbar ESI's. Therefore, I will instead refer the patient to Dr.Ibazebo. I think she would also benefit from physical therapy but I will defer further workup and treatment to the discretion of Dr.Ibazebo. I did reassure the patient that I do not see any obvious surgical pathology present. Followup with me when necessary.

## 2013-07-11 NOTE — Patient Instructions (Addendum)
You have been scheduled for an appointment with at [facility] with Dr. Ron Agee on 07/26/13 at 9:30 am for evaluation for Epidural Steroid Injections  Their office is located at [place]

## 2013-07-24 ENCOUNTER — Encounter: Payer: Managed Care, Other (non HMO) | Admitting: Physical Medicine & Rehabilitation

## 2013-07-24 ENCOUNTER — Ambulatory Visit: Payer: Managed Care, Other (non HMO)

## 2013-07-29 ENCOUNTER — Emergency Department (HOSPITAL_COMMUNITY): Payer: Managed Care, Other (non HMO)

## 2013-07-29 ENCOUNTER — Encounter (HOSPITAL_COMMUNITY): Payer: Self-pay | Admitting: Emergency Medicine

## 2013-07-29 ENCOUNTER — Emergency Department (HOSPITAL_COMMUNITY)
Admission: EM | Admit: 2013-07-29 | Discharge: 2013-07-30 | Disposition: A | Discharge status: Home / Self Care | Payer: Managed Care, Other (non HMO) | Attending: Emergency Medicine | Admitting: Emergency Medicine

## 2013-07-29 DIAGNOSIS — R0789 Other chest pain: Secondary | ICD-10-CM | POA: Insufficient documentation

## 2013-07-29 DIAGNOSIS — R0989 Other specified symptoms and signs involving the circulatory and respiratory systems: Secondary | ICD-10-CM | POA: Insufficient documentation

## 2013-07-29 DIAGNOSIS — Z79899 Other long term (current) drug therapy: Secondary | ICD-10-CM | POA: Insufficient documentation

## 2013-07-29 DIAGNOSIS — Z794 Long term (current) use of insulin: Secondary | ICD-10-CM | POA: Insufficient documentation

## 2013-07-29 DIAGNOSIS — Z8719 Personal history of other diseases of the digestive system: Secondary | ICD-10-CM | POA: Insufficient documentation

## 2013-07-29 DIAGNOSIS — E119 Type 2 diabetes mellitus without complications: Secondary | ICD-10-CM | POA: Insufficient documentation

## 2013-07-29 DIAGNOSIS — R739 Hyperglycemia, unspecified: Secondary | ICD-10-CM

## 2013-07-29 DIAGNOSIS — R0600 Dyspnea, unspecified: Secondary | ICD-10-CM

## 2013-07-29 DIAGNOSIS — Z791 Long term (current) use of non-steroidal anti-inflammatories (NSAID): Secondary | ICD-10-CM | POA: Insufficient documentation

## 2013-07-29 DIAGNOSIS — R0609 Other forms of dyspnea: Secondary | ICD-10-CM | POA: Insufficient documentation

## 2013-07-29 LAB — URINALYSIS, ROUTINE W REFLEX MICROSCOPIC
Bilirubin Urine: NEGATIVE
Glucose, UA: >1000 mg/dL — AB
Ketones, ur: 15 mg/dL — AB
Leukocytes, UA: NEGATIVE
Nitrite: NEGATIVE
Protein, ur: NEGATIVE mg/dL
Specific Gravity, Urine: 1.043 — ABNORMAL HIGH (ref 1.005–1.030)
Urobilinogen, UA: 0.2 mg/dL (ref 0.0–1.0)
pH: 5.5 (ref 5.0–8.0)

## 2013-07-29 LAB — COMPREHENSIVE METABOLIC PANEL
ALT: 10 U/L (ref 0–35)
AST: 15 U/L (ref 0–37)
Albumin: 3.3 g/dL — ABNORMAL LOW (ref 3.5–5.2)
Alkaline Phosphatase: 125 U/L — ABNORMAL HIGH (ref 39–117)
BUN: 13 mg/dL (ref 6–23)
CO2: 22 mEq/L (ref 19–32)
Calcium: 8.6 mg/dL (ref 8.4–10.5)
Chloride: 97 mEq/L (ref 96–112)
Creatinine, Ser: 0.62 mg/dL (ref 0.50–1.10)
GFR calc Af Amer: >90 mL/min (ref >90)
GFR calc non Af Amer: >90 mL/min (ref >90)
Glucose, Bld: 466 mg/dL — ABNORMAL HIGH (ref 70–99)
Potassium: 4.1 mEq/L (ref 3.7–5.3)
Sodium: 134 mEq/L — ABNORMAL LOW (ref 137–147)
Total Bilirubin: <0.2 mg/dL — ABNORMAL LOW (ref 0.3–1.2)
Total Protein: 6.8 g/dL (ref 6.0–8.3)

## 2013-07-29 LAB — CBC
HCT: 38.7 % (ref 36.0–46.0)
Hemoglobin: 13.4 g/dL (ref 12.0–15.0)
MCH: 27.6 pg (ref 26.0–34.0)
MCHC: 34.6 g/dL (ref 30.0–36.0)
MCV: 79.6 fL (ref 78.0–100.0)
Platelets: 169 10*3/uL (ref 150–400)
RBC: 4.86 MIL/uL (ref 3.87–5.11)
RDW: 13.2 % (ref 11.5–15.5)
WBC: 7.2 10*3/uL (ref 4.0–10.5)

## 2013-07-29 LAB — URINE MICROSCOPIC-ADD ON

## 2013-07-29 LAB — POCT I-STAT TROPONIN I: Troponin i, poc: 0 ng/mL (ref 0.00–0.08)

## 2013-07-29 NOTE — ED Notes (Signed)
Pt here from home with c/o chest pain and sob . Started today

## 2013-07-30 LAB — GLUCOSE, CAPILLARY: Glucose-Capillary: 209 mg/dL — ABNORMAL HIGH (ref 70–99)

## 2013-07-30 MED ORDER — INSULIN ASPART 100 UNIT/ML IV SOLN
10.0000 [IU] | Freq: Once | INTRAVENOUS | Status: AC
  Administered 2013-07-30: 10 [IU] via INTRAVENOUS

## 2013-07-30 MED ORDER — SODIUM CHLORIDE 0.9 % IV BOLUS (SEPSIS)
1000.0000 mL | Freq: Once | INTRAVENOUS | Status: AC
  Administered 2013-07-30: 1000 mL via INTRAVENOUS

## 2013-07-30 MED ORDER — ONDANSETRON 4 MG PO TBDP: 4.0000 mg | ORAL_TABLET | Freq: Three times a day (TID) | ORAL | Status: DC | PRN

## 2013-07-30 MED ORDER — POTASSIUM CHLORIDE CRYS ER 20 MEQ PO TBCR
40.0000 meq | EXTENDED_RELEASE_TABLET | Freq: Once | ORAL | Status: AC
  Administered 2013-07-30: 40 meq via ORAL
  Filled 2013-07-30: qty 2

## 2013-07-30 MED ORDER — ONDANSETRON HCL 4 MG/2ML IJ SOLN
4.0000 mg | Freq: Once | INTRAMUSCULAR | Status: AC
  Administered 2013-07-30: 4 mg via INTRAVENOUS
  Filled 2013-07-30: qty 2

## 2013-07-30 NOTE — ED Notes (Signed)
Pt states she has shooting pain in her left leg, PA informed while he was at bedside.

## 2013-07-30 NOTE — Discharge Instructions (Signed)
Make an appointment with Cardiology for a follow up appointment for your intermittent chest pains and shortness of breath. Follow up with your primary care doctor for further maintenance of your Diabetes. Check your blood glucose daily. Keep a log of your blood glucose. Continue your home medications as prescribed. Should you develop worsening chest pain, progressive shortness of breath, or new symptoms please return to the ED for further evaluation.   Take nausea medication as directed

## 2013-07-30 NOTE — ED Provider Notes (Signed)
CSN: [id number]     Arrival date & time 07/29/13  1851 History   First MD Initiated Contact with Patient 07/29/13 2259     Chief Complaint  Patient presents with  . Chest Pain   (Consider location/radiation/quality/duration/timing/severity/associated sxs/prior Treatment) Patient is a 48 y.o. female presenting with chest pain.  Chest Pain  48 yo female with PMH significant for Diabetes Mellitus presents with 1 week hx of Left sided chest pain that is intermittent, crampy, without radiation. Pain rated at 7/10 and is not worsened with activity. Patient admits to recent DOE, fatigue, urinary frequency. Patient states dyspnea does not seem to be associated with the Chest pain. Denies dizziness, N/V, or jaw pain. Admits to a chronic hx of constipation. Patient denies hx of HTN, High cholesterol, and patient is a never smoker. Admits to father having MI at age 66. Denies recent travel, recent surgery, or hx of cancer.  Patient states she has not checked her blood sugar since last Tuesday.   Advanced age > 35 yo: No HTN: No Hyperlipidemia: No Cigarette smoking: No Diabetes Mellitus: Yes Family hx of CAD or MI < 67 yo : No Female or Post menopausal: No Cocaine use: No CABG: No Stress test: Yes (Normal Echo Stress test in 2011 and Normal 2D Echo in 2014)  Age > 47 yo: No HR > 100 bpm: No O2 sat on RA < 95%: No Prior hx of venous thromboembolism:No Trauma or surgery in past 4 wks:No Hemoptysis:No Exogenous Estrogen use:No Unilateral Leg swelling: No            Past Medical History  Diagnosis Date  . Diabetes mellitus    Past Surgical History  Procedure Laterality Date  . Abdominal hysterectomy    . Tubal ligation    . Tonsillectomy    . Cesarean section    . Ganglion cyst excision    . Cholecystectomy    . Eye surgery Left retinal   Family History  Problem Relation Age of Onset  . Diabetes Father   . Heart attack Father    History  Substance Use Topics  . Smoking  status: Never Smoker   . Smokeless tobacco: Never Used  . Alcohol Use: No   OB History   Grav Para Term Preterm Abortions TAB SAB Ect Mult Living                 Review of Systems  Cardiovascular: Positive for chest pain.  All other systems reviewed and are negative.    Allergies  Review of patient's allergies indicates no known allergies.  Home Medications   Current Outpatient Rx  Name  Route  Sig  Dispense  Refill  . acetaminophen-codeine (TYLENOL #3) 300-30 MG per tablet   Oral   Take 1 tablet by mouth 3 (three) times daily as needed for moderate pain.   40 tablet   0   . fish oil-omega-3 fatty acids 1000 MG capsule   Oral   Take 2 capsules (2 g total) by mouth daily.   30 capsule   3   . insulin aspart (NOVOLOG FLEXPEN) 100 UNIT/ML SOPN FlexPen   Subcutaneous   Inject 25 Units into the skin 3 (three) times daily with meals.   10 pen   6   . insulin glargine (LANTUS) 100 UNIT/ML injection   Subcutaneous   Inject 50 Units into the skin at bedtime.         . metFORMIN (GLUCOPHAGE) 500 MG tablet  Oral   Take 500 mg by mouth 2 (two) times daily with a meal. Take daily for 7 days, and then increase to twice daily.         . naproxen (NAPROSYN) 500 MG tablet   Oral   Take 1 tablet (500 mg total) by mouth 2 (two) times daily with a meal.   30 tablet   0   . pregabalin (LYRICA) 100 MG capsule   Oral   Take 1 capsule (100 mg total) by mouth 3 (three) times daily after meals.   90 capsule   1   . traMADol (ULTRAM) 50 MG tablet   Oral   Take 25-50 mg by mouth 2 (two) times daily as needed for moderate pain.         Marland Kitchen ondansetron (ZOFRAN ODT) 4 MG disintegrating tablet   Oral   Take 1 tablet (4 mg total) by mouth every 8 (eight) hours as needed for nausea.   10 tablet   0    BP 133/89  Pulse 70  Temp(Src) 98.5 F (36.9 C) (Oral)  Resp 14  Ht 5' (1.524 m)  Wt 174 lb 2 oz (78.983 kg)  BMI 34.01 kg/m2  SpO2 100% Physical Exam  Nursing note  and vitals reviewed. Constitutional: She is oriented to person, place, and time. She appears well-developed and well-nourished. No distress.  HENT:  Head: Normocephalic and atraumatic.  Eyes: Conjunctivae and EOM are normal. No scleral icterus.  Neck: Normal range of motion. Neck supple.  Cardiovascular: Normal rate and regular rhythm.  Exam reveals no gallop and no friction rub.   No murmur heard. Pulmonary/Chest: Effort normal and breath sounds normal. No respiratory distress. She has no wheezes. She has no rales.  Abdominal: Soft. Bowel sounds are normal. She exhibits no distension. There is no tenderness. There is no rebound and no guarding.  Musculoskeletal: Normal range of motion. She exhibits no edema.  Neurological: She is alert and oriented to person, place, and time.  Skin: Skin is warm and dry. She is not diaphoretic.  Psychiatric: She has a normal mood and affect. Her behavior is normal.    ED Course  Procedures (including critical care time) Labs Review Labs Reviewed  COMPREHENSIVE METABOLIC PANEL - Abnormal; Notable for the following:    Sodium 134 (*)    Glucose, Bld 466 (*)    Albumin 3.3 (*)    Alkaline Phosphatase 125 (*)    Total Bilirubin <0.2 (*)    All other components within normal limits  URINALYSIS, ROUTINE W REFLEX MICROSCOPIC - Abnormal; Notable for the following:    APPearance CLOUDY (*)    Specific Gravity, Urine 1.043 (*)    Glucose, UA >1000 (*)    Hgb urine dipstick SMALL (*)    Ketones, ur 15 (*)    All other components within normal limits  URINE MICROSCOPIC-ADD ON - Abnormal; Notable for the following:    Squamous Epithelial / LPF MANY (*)    All other components within normal limits  GLUCOSE, CAPILLARY - Abnormal; Notable for the following:    Glucose-Capillary 209 (*)    All other components within normal limits  CBC  POCT I-STAT TROPONIN I   Imaging Review Dg Chest 2 View  07/29/2013   CLINICAL DATA:  Chest heaviness, shortness of  breath  EXAM: CHEST  2 VIEW  COMPARISON:  06/13/2013  FINDINGS: The heart size and mediastinal contours are within normal limits. Both lungs are clear. The visualized skeletal  structures are unremarkable.  IMPRESSION: No active cardiopulmonary disease.   Electronically Signed   By: Daryll Brod M.D.   On: 07/29/2013 19:42    EKG Interpretation   None       MDM   1. Hyperglycemia   2. Atypical chest pain   3. DOE (dyspnea on exertion)    Patient afebrile with normal vital signs. NAD. PERC negative. Pulse Oximetry 100% on RA. No tachycardia. EKG shows normal sinus rhythm without signs of acute ischemia.  Troponin Negative  CXR negative  UA shows concentrated urine, glucosuria, and mild ketonuria.  Pseudohyponatremia due to hyperglycemia.  Hyperglycemia at 466 with anion gap. CO2 WNL. Blood glucose improved to 209 with IV fluids and IV insulin.   Patient ambulated in hallway with pulse oximetry. Patient maintained o2 sat of 98% without any complaints of Chest Pain, Chest tightness, or SOB. Patient conversing with me in full sentences while walking. No evidence of labored breathing.   Doubt MI. Doubt PE. Doubt CHF due to exam not consistent with CHF and patient has normal Echo in 2014. Suspect patient's sxs of fatigue and urinary frequency related to poorly controlled Diabetes.   Plan to have patient follow up with Cardiology in 2 days for evaluation of intermittent chest pain and DOE. Plan to have patient followup with PCP for continued maintenance of Diabetes.  Discussed labs and exam findings in detail with patient. Patient confirms understanding. Patient agrees with plan for follow up. Discharged in good condition.      Meds given in ED:  Medications  sodium chloride 0.9 % bolus 1,000 mL (0 mLs Intravenous Stopped 07/30/13 0130)  sodium chloride 0.9 % bolus 1,000 mL (0 mLs Intravenous Stopped 07/30/13 0303)  insulin aspart (novoLOG) injection 10 Units (10 Units Intravenous Given  07/30/13 0131)  potassium chloride SA (K-DUR,KLOR-CON) CR tablet 40 mEq (40 mEq Oral Given 07/30/13 0052)  ondansetron (ZOFRAN) injection 4 mg (4 mg Intravenous Given 07/30/13 0053)    Discharge Medication List as of 07/30/2013  3:41 AM    START taking these medications   Details  ondansetron (ZOFRAN ODT) 4 MG disintegrating tablet Take 1 tablet (4 mg total) by mouth every 8 (eight) hours as needed for nausea., Starting 07/30/2013, Until Discontinued, Print           Sherrie George, PA-C 07/31/13 2325

## 2013-07-30 NOTE — ED Notes (Addendum)
PA at bedside, checking pulse during ambulation.

## 2013-07-30 NOTE — ED Notes (Signed)
PA at bedside.

## 2013-08-01 NOTE — ED Provider Notes (Signed)
48 year old female, history of diabetes, presents with a complaint of left-sided chest discomfort as well as generalized weakness and fatigue. She has not taken her diabetic medications today and does not follow her blood sugar regularly. She is unsure of how high it has been. She states that this chest pain as a heaviness, sometimes a sharp or achy sensation, seems to be with movement but not with exertion, may come on at rest as well as while standing but is not reproducible with exertion. She had a normal Cardiolite stress test in 2010 with a normal ejection fraction at that time but does not follow regularly with cardiology. Her EKG is nonischemic, labs show hyperglycemia with a mild anion gap but a normal CO2. She'll be given IV fluids, she is low risk for acute coronary syndrome given her atypical symptoms, she is very low risk for pulmonary embolism and is not hypoxic nor is she tachycardic. She has no peripheral edema or asymmetry of the legs. We'll treat hyperglycemia, discharged to followup with family doctor in cardiology in outpatient setting.  Medical screening examination/treatment/procedure(s) were conducted as a shared visit with non-physician practitioner(s) and myself. I personally evaluated the patient during the encounter.  Clinical Impression: hyperglycemia - chest pain   Johnna Acosta, MD 08/01/13 [phone]

## 2013-08-13 ENCOUNTER — Ambulatory Visit: Payer: Managed Care, Other (non HMO) | Attending: Internal Medicine | Admitting: Internal Medicine

## 2013-08-13 ENCOUNTER — Encounter: Payer: Self-pay | Admitting: Internal Medicine

## 2013-08-13 VITALS — BP 94/66 | HR 59 | Temp 98.4°F | Resp 16 | Ht 60.0 in | Wt 173.4 lb

## 2013-08-13 DIAGNOSIS — IMO0001 Reserved for inherently not codable concepts without codable children: Secondary | ICD-10-CM

## 2013-08-13 DIAGNOSIS — IMO0002 Reserved for concepts with insufficient information to code with codable children: Secondary | ICD-10-CM

## 2013-08-13 DIAGNOSIS — R079 Chest pain, unspecified: Secondary | ICD-10-CM | POA: Insufficient documentation

## 2013-08-13 DIAGNOSIS — Z79899 Other long term (current) drug therapy: Secondary | ICD-10-CM | POA: Insufficient documentation

## 2013-08-13 DIAGNOSIS — E1142 Type 2 diabetes mellitus with diabetic polyneuropathy: Secondary | ICD-10-CM | POA: Insufficient documentation

## 2013-08-13 DIAGNOSIS — E1149 Type 2 diabetes mellitus with other diabetic neurological complication: Secondary | ICD-10-CM | POA: Insufficient documentation

## 2013-08-13 DIAGNOSIS — I10 Essential (primary) hypertension: Secondary | ICD-10-CM | POA: Insufficient documentation

## 2013-08-13 DIAGNOSIS — E1165 Type 2 diabetes mellitus with hyperglycemia: Secondary | ICD-10-CM

## 2013-08-13 DIAGNOSIS — I059 Rheumatic mitral valve disease, unspecified: Secondary | ICD-10-CM | POA: Insufficient documentation

## 2013-08-13 LAB — GLUCOSE, POCT (MANUAL RESULT ENTRY)
POC Glucose: 456 mg/dl — AB (ref 70–99)
POC Glucose: 367 mg/dl — AB (ref 70–99)

## 2013-08-13 LAB — POCT GLYCOSYLATED HEMOGLOBIN (HGB A1C): Hemoglobin A1C: 14

## 2013-08-13 MED ORDER — INSULIN ASPART 100 UNIT/ML FLEXPEN: 15.0000 [IU] | PEN_INJECTOR | Freq: Three times a day (TID) | SUBCUTANEOUS | Status: DC

## 2013-08-13 MED ORDER — GLUCOSE BLOOD VI STRP: ORAL_STRIP | Status: DC

## 2013-08-13 MED ORDER — SODIUM CHLORIDE 0.9 % IV SOLN
INTRAVENOUS | Status: DC
  Administered 2013-08-13: 14:00:00 via INTRAVENOUS

## 2013-08-13 MED ORDER — METFORMIN HCL 1000 MG PO TABS: 1000.0000 mg | ORAL_TABLET | Freq: Two times a day (BID) | ORAL | Status: DC

## 2013-08-13 MED ORDER — INSULIN GLARGINE 100 UNIT/ML ~~LOC~~ SOLN: 40.0000 [IU] | Freq: Two times a day (BID) | SUBCUTANEOUS | Status: DC

## 2013-08-13 MED ORDER — LISINOPRIL 10 MG PO TABS: 10.0000 mg | ORAL_TABLET | Freq: Every day | ORAL | Status: DC

## 2013-08-13 MED ORDER — INSULIN ASPART 100 UNIT/ML ~~LOC~~ SOLN: 20.0000 [IU] | Freq: Once | SUBCUTANEOUS | Status: DC

## 2013-08-13 MED ORDER — PREGABALIN 100 MG PO CAPS: 100.0000 mg | ORAL_CAPSULE | Freq: Three times a day (TID) | ORAL | Status: DC

## 2013-08-13 NOTE — Progress Notes (Signed)
Patient ID: [name] [name], female   DOB: 01-Sep-1965, 48 y.o.   MRN: [id number]   CC:  HPI:  48 year old female with a history of diabetes, last A1c of 13.8 and November 2014, who presents today with a chief complaint of uncontrolled diabetes. The patient was seen in the ED on 1/25 for chest pain. The patient's pain was present at rest under her left breast. CBC was 466 in the ED. Today it is 456. The patient has been out of her lancets and has not been checking her sugar for about a week. In the ER the patient was found to have EKG shows normal sinus rhythm without signs of acute ischemia.  Troponin Negative  CXR negative  UA shows concentrated urine, glucosuria, and mild ketonuria.  Pseudohyponatremia due to hyperglycemia.  Hyperglycemia at 466 with anion gap  Today the patient is chest pain-free. Patient states that she had a stress test a year ago that was negative  No Known Allergies Past Medical History  Diagnosis Date  . Diabetes mellitus    Current Outpatient Prescriptions on File Prior to Visit  Medication Sig Dispense Refill  . acetaminophen-codeine (TYLENOL #3) 300-30 MG per tablet Take 1 tablet by mouth 3 (three) times daily as needed for moderate pain.  40 tablet  0  . fish oil-omega-3 fatty acids 1000 MG capsule Take 2 capsules (2 g total) by mouth daily.  30 capsule  3  . naproxen (NAPROSYN) 500 MG tablet Take 1 tablet (500 mg total) by mouth 2 (two) times daily with a meal.  30 tablet  0  . ondansetron (ZOFRAN ODT) 4 MG disintegrating tablet Take 1 tablet (4 mg total) by mouth every 8 (eight) hours as needed for nausea.  10 tablet  0  . traMADol (ULTRAM) 50 MG tablet Take 25-50 mg by mouth 2 (two) times daily as needed for moderate pain.       No current facility-administered medications on file prior to visit.   Family History  Problem Relation Age of Onset  . Diabetes Father   . Heart attack Father    History   Social History  . Marital Status: Married   Spouse Name: N/A    Number of Children: N/A  . Years of Education: N/A   Occupational History  . Not on file.   Social History Main Topics  . Smoking status: Never Smoker   . Smokeless tobacco: Never Used  . Alcohol Use: No  . Drug Use: No  . Sexual Activity: Yes   Other Topics Concern  . Not on file   Social History Narrative  . No narrative on file    Review of Systems  Constitutional: Negative for fever, chills, diaphoresis, activity change, appetite change and fatigue.  HENT: Negative for ear pain, nosebleeds, congestion, facial swelling, rhinorrhea, neck pain, neck stiffness and ear discharge.   Eyes: Negative for pain, discharge, redness, itching and visual disturbance.  Respiratory: Negative for cough, choking, chest tightness, shortness of breath, wheezing and stridor.   Cardiovascular: Negative for chest pain, palpitations and leg swelling.  Gastrointestinal: Negative for abdominal distention.  Genitourinary: Negative for dysuria, urgency, frequency, hematuria, flank pain, decreased urine volume, difficulty urinating and dyspareunia.  Musculoskeletal: Negative for back pain, joint swelling, arthralgias and gait problem.  Neurological: Negative for dizziness, tremors, seizures, syncope, facial asymmetry, speech difficulty, weakness, light-headedness, numbness and headaches.  Hematological: Negative for adenopathy. Does not bruise/bleed easily.  Psychiatric/Behavioral: Negative for hallucinations, behavioral problems, confusion, dysphoric mood, decreased  concentration and agitation.    Objective:   Filed Vitals:   08/13/13 1149  BP: 166/105  Pulse: 75  Temp: 98.4 F (36.9 C)  Resp: 14    Physical Exam  Constitutional: Appears well-developed and well-nourished. No distress.  HENT: Normocephalic. External right and left ear normal. Oropharynx is clear and moist.  Eyes: Conjunctivae and EOM are normal. PERRLA, no scleral icterus.  Neck: Normal ROM. Neck supple.  No JVD. No tracheal deviation. No thyromegaly.  CVS: RRR, S1/S2 +, no murmurs, no gallops, no carotid bruit.  Pulmonary: Effort and breath sounds normal, no stridor, rhonchi, wheezes, rales.  Abdominal: Soft. BS +,  no distension, tenderness, rebound or guarding.  Musculoskeletal: Normal range of motion. No edema and no tenderness.  Lymphadenopathy: No lymphadenopathy noted, cervical, inguinal. Neuro: Alert. Normal reflexes, muscle tone coordination. No cranial nerve deficit. Skin: Skin is warm and dry. No rash noted. Not diaphoretic. No erythema. No pallor.  Psychiatric: Normal mood and affect. Behavior, judgment, thought content normal.   Lab Results  Component Value Date   WBC 7.2 07/29/2013   HGB 13.4 07/29/2013   HCT 38.7 07/29/2013   MCV 79.6 07/29/2013   PLT 169 07/29/2013   Lab Results  Component Value Date   CREATININE 0.62 07/29/2013   BUN 13 07/29/2013   NA 134* 07/29/2013   K 4.1 07/29/2013   CL 97 07/29/2013   CO2 22 07/29/2013    Lab Results  Component Value Date   HGBA1C 13.8* 05/09/2013   Lipid Panel     Component Value Date/Time   CHOL 257* 03/27/2013 1030   TRIG 122 03/27/2013 1030   HDL 57 03/27/2013 1030   CHOLHDL 4.5 03/27/2013 1030   VLDL 24 03/27/2013 1030   LDLCALC 176* 03/27/2013 1030       Assessment and plan:   Patient Active Problem List   Diagnosis Date Noted  . Back pain 06/11/2013  . Acute upper respiratory infections of unspecified site 06/11/2013  . Lumbosacral spondylosis without myelopathy 02/20/2013  . Diabetic neuropathy 01/09/2013  . DM (diabetes mellitus), type 2, uncontrolled 10/02/2012  . MITRAL REGURGITATION, mild 10/31/2009  . PLEURAL EFFUSION, RIGHT after pyelonephritis in April 2011 (no intervention) 10/20/2009   Uncontrolled diabetes CBg 456 The patient is being administered 20 units of NovoLog and 1 L bolus of normal saline here in the clinic prior to discharge Increase metformin to thousand milligrams twice a day CMP panel be  drawn We'll check her urine dipstick to rule out any underlying infection. Increase Lantus to 40 units in the morning and 40 units in the evening Change aspart to 15 units before each meal Patient will return for diabetes check in 2 weeks   Hypertension Patient currently is on no medications she will be started on lisinopril 10 mg a day  Followup in 2 weeks      The patient was given clear instructions to go to ER or return to medical center if symptoms don't improve, worsen or new problems develop. The patient verbalized understanding. The patient was told to call to get any lab results if not heard anything in the next week.

## 2013-08-13 NOTE — Progress Notes (Signed)
Pt is here for a f/u. Pt is a diabetic and is hypertensive today. Has no complaints today. Needs a refill on both insulin's and Lyrica.

## 2013-08-14 ENCOUNTER — Emergency Department (HOSPITAL_COMMUNITY)
Admission: EM | Admit: 2013-08-14 | Discharge: 2013-08-14 | Disposition: A | Discharge status: Home / Self Care | Payer: Managed Care, Other (non HMO) | Attending: Emergency Medicine | Admitting: Emergency Medicine

## 2013-08-14 ENCOUNTER — Telehealth: Payer: Self-pay | Admitting: Emergency Medicine

## 2013-08-14 ENCOUNTER — Encounter (HOSPITAL_COMMUNITY): Payer: Self-pay | Admitting: Emergency Medicine

## 2013-08-14 ENCOUNTER — Ambulatory Visit: Payer: Managed Care, Other (non HMO) | Attending: Internal Medicine

## 2013-08-14 ENCOUNTER — Telehealth: Payer: Self-pay

## 2013-08-14 VITALS — BP 168/97 | HR 76 | Temp 98.6°F | Resp 16

## 2013-08-14 DIAGNOSIS — Z791 Long term (current) use of non-steroidal anti-inflammatories (NSAID): Secondary | ICD-10-CM | POA: Insufficient documentation

## 2013-08-14 DIAGNOSIS — R300 Dysuria: Secondary | ICD-10-CM

## 2013-08-14 DIAGNOSIS — R0789 Other chest pain: Secondary | ICD-10-CM | POA: Insufficient documentation

## 2013-08-14 DIAGNOSIS — R739 Hyperglycemia, unspecified: Secondary | ICD-10-CM

## 2013-08-14 DIAGNOSIS — E1310 Other specified diabetes mellitus with ketoacidosis without coma: Secondary | ICD-10-CM

## 2013-08-14 DIAGNOSIS — E875 Hyperkalemia: Secondary | ICD-10-CM | POA: Insufficient documentation

## 2013-08-14 DIAGNOSIS — IMO0001 Reserved for inherently not codable concepts without codable children: Secondary | ICD-10-CM

## 2013-08-14 DIAGNOSIS — Z794 Long term (current) use of insulin: Secondary | ICD-10-CM | POA: Insufficient documentation

## 2013-08-14 DIAGNOSIS — N390 Urinary tract infection, site not specified: Secondary | ICD-10-CM | POA: Insufficient documentation

## 2013-08-14 DIAGNOSIS — E119 Type 2 diabetes mellitus without complications: Secondary | ICD-10-CM | POA: Insufficient documentation

## 2013-08-14 DIAGNOSIS — Z79899 Other long term (current) drug therapy: Secondary | ICD-10-CM | POA: Insufficient documentation

## 2013-08-14 DIAGNOSIS — I10 Essential (primary) hypertension: Secondary | ICD-10-CM | POA: Insufficient documentation

## 2013-08-14 DIAGNOSIS — Z792 Long term (current) use of antibiotics: Secondary | ICD-10-CM | POA: Insufficient documentation

## 2013-08-14 DIAGNOSIS — E1110 Type 2 diabetes mellitus with ketoacidosis without coma: Secondary | ICD-10-CM

## 2013-08-14 HISTORY — DX: Essential (primary) hypertension: I10

## 2013-08-14 LAB — COMPLETE METABOLIC PANEL WITH GFR
ALT: 15 U/L (ref 0–35)
AST: 50 U/L — ABNORMAL HIGH (ref 0–37)
Albumin: 4.4 g/dL (ref 3.5–5.2)
Alkaline Phosphatase: 131 U/L — ABNORMAL HIGH (ref 39–117)
BUN: 10 mg/dL (ref 6–23)
CO2: 23 mEq/L (ref 19–32)
Calcium: 8.4 mg/dL (ref 8.4–10.5)
Chloride: 93 mEq/L — ABNORMAL LOW (ref 96–112)
Creat: 0.75 mg/dL (ref 0.50–1.10)
GFR, Est African American: >89 mL/min
GFR, Est Non African American: >89 mL/min
Glucose, Bld: 523 mg/dL (ref 70–99)
Potassium: 6.2 mEq/L — ABNORMAL HIGH (ref 3.5–5.3)
Sodium: 126 mEq/L — ABNORMAL LOW (ref 135–145)
Total Bilirubin: 0.7 mg/dL (ref 0.2–1.2)
Total Protein: 7.4 g/dL (ref 6.0–8.3)

## 2013-08-14 LAB — URINALYSIS, ROUTINE W REFLEX MICROSCOPIC
Bilirubin Urine: NEGATIVE
Glucose, UA: >1000 mg/dL — AB
Ketones, ur: NEGATIVE mg/dL
Nitrite: NEGATIVE
Protein, ur: NEGATIVE mg/dL
Specific Gravity, Urine: 1.034 — ABNORMAL HIGH (ref 1.005–1.030)
Urobilinogen, UA: 0.2 mg/dL (ref 0.0–1.0)
pH: 5.5 (ref 5.0–8.0)

## 2013-08-14 LAB — URINE MICROSCOPIC-ADD ON

## 2013-08-14 LAB — COMPREHENSIVE METABOLIC PANEL
ALT: 11 U/L (ref 0–35)
AST: 16 U/L (ref 0–37)
Albumin: 3 g/dL — ABNORMAL LOW (ref 3.5–5.2)
Alkaline Phosphatase: 116 U/L (ref 39–117)
BUN: 9 mg/dL (ref 6–23)
CO2: 22 mEq/L (ref 19–32)
Calcium: 8.5 mg/dL (ref 8.4–10.5)
Chloride: 102 mEq/L (ref 96–112)
Creatinine, Ser: 0.5 mg/dL (ref 0.50–1.10)
GFR calc Af Amer: >90 mL/min (ref >90)
GFR calc non Af Amer: >90 mL/min (ref >90)
Glucose, Bld: 453 mg/dL — ABNORMAL HIGH (ref 70–99)
Potassium: 4.5 mEq/L (ref 3.7–5.3)
Sodium: 137 mEq/L (ref 137–147)
Total Bilirubin: 0.2 mg/dL — ABNORMAL LOW (ref 0.3–1.2)
Total Protein: 6.6 g/dL (ref 6.0–8.3)

## 2013-08-14 LAB — CBC
HCT: 38.6 % (ref 36.0–46.0)
Hemoglobin: 13 g/dL (ref 12.0–15.0)
MCH: 27.4 pg (ref 26.0–34.0)
MCHC: 33.7 g/dL (ref 30.0–36.0)
MCV: 81.3 fL (ref 78.0–100.0)
Platelets: 134 10*3/uL — ABNORMAL LOW (ref 150–400)
RBC: 4.75 MIL/uL (ref 3.87–5.11)
RDW: 13.3 % (ref 11.5–15.5)
WBC: 6.8 10*3/uL (ref 4.0–10.5)

## 2013-08-14 LAB — TROPONIN I
Troponin I: <0.3 ng/mL (ref <0.30)
Troponin I: <0.3 ng/mL (ref <0.30)

## 2013-08-14 LAB — GLUCOSE, POCT (MANUAL RESULT ENTRY): POC Glucose: 487 mg/dl — AB (ref 70–99)

## 2013-08-14 LAB — GLUCOSE, CAPILLARY
Glucose-Capillary: 392 mg/dL — ABNORMAL HIGH (ref 70–99)
Glucose-Capillary: 275 mg/dL — ABNORMAL HIGH (ref 70–99)

## 2013-08-14 MED ORDER — CEPHALEXIN 500 MG PO CAPS: 500.0000 mg | ORAL_CAPSULE | Freq: Four times a day (QID) | ORAL | Status: DC

## 2013-08-14 MED ORDER — INSULIN ASPART 100 UNIT/ML ~~LOC~~ SOLN
15.0000 [IU] | Freq: Once | SUBCUTANEOUS | Status: AC
  Administered 2013-08-14: 15 [IU] via SUBCUTANEOUS
  Filled 2013-08-14: qty 1

## 2013-08-14 MED ORDER — INSULIN ASPART 100 UNIT/ML ~~LOC~~ SOLN
10.0000 [IU] | Freq: Once | SUBCUTANEOUS | Status: DC
Start: 2013-08-14 — End: 2013-08-14

## 2013-08-14 MED ORDER — SODIUM CHLORIDE 0.9 % IV SOLN: 1000.0000 mL | Freq: Once | INTRAVENOUS | Status: DC

## 2013-08-14 MED ORDER — SODIUM CHLORIDE 0.9 % IV BOLUS (SEPSIS)
2000.0000 mL | Freq: Once | INTRAVENOUS | Status: AC
  Administered 2013-08-14: 2000 mL via INTRAVENOUS

## 2013-08-14 MED ORDER — CEFTRIAXONE SODIUM 1 G IJ SOLR
1.0000 g | Freq: Once | INTRAMUSCULAR | Status: AC
  Administered 2013-08-14: 1 g via INTRAVENOUS
  Filled 2013-08-14: qty 10

## 2013-08-14 NOTE — Patient Instructions (Signed)
Pt being transferred to [place] via EMS in stable condition. Report given to charge nurse

## 2013-08-14 NOTE — ED Notes (Signed)
Per EMS pt from [facility], seen today for a change in her diabetes regimen. Prior to being seen pt had 10 second episode of epigastric/chest pain. 12 Lead unremarkable. Pt given aspirin 324mg . IV 20RAC. CBG 487. HTN 182/100. No chest pain on arrival.

## 2013-08-14 NOTE — Telephone Encounter (Signed)
Pt informed to come in ASAP for blood work K+ 6.2 Pt denies chest pain Will be in

## 2013-08-14 NOTE — ED Provider Notes (Signed)
CSN: [id number]     Arrival date & time 08/14/13  1635 History   First MD Initiated Contact with Patient 08/14/13 1635     Chief Complaint  Patient presents with  . Chest Pain  . Hyperglycemia     (Consider location/radiation/quality/duration/timing/severity/associated sxs/prior Treatment) The history is provided by the patient and medical records. No language interpreter was used.    [name] [name] is a 48 y.o. female  with a hx of insulin-dependent diabetes, hypertension presents to the Emergency Department complaining of gradual, persistent, progressively worsening hyperglycemia onset 1.5-2 weeks ago.  Patient is coming from the [facility] for hyperglycemia and hyperkalemia.  Patient was found to be hyperglycemic yesterday at her appointment and her insulin was adjusted. She was brought back today or recheck and found to still have elevated blood sugar.  CMP drawn yesterday showed hyperkalemia at 6.2.  Associated symptoms include polydipsia, polyuria.  Patient also endorses one episode of sharp epigastric/substernal chest pain that radiated into her left breast during this morning and lasting approximately 30 seconds. Nothing makes it better and nothing makes it worse.  Patient reports she was seen here in the emergency room several weeks ago for similar chest pain and released home. Pt denies fever, chills, headache, neck pain, possible, diaphoresis, abdominal pain, nausea, vomiting, diarrhea.  She does endorse some area for last several days.     Past Medical History  Diagnosis Date  . Diabetes mellitus   . Hypertension    Past Surgical History  Procedure Laterality Date  . Abdominal hysterectomy    . Tubal ligation    . Tonsillectomy    . Cesarean section    . Ganglion cyst excision    . Cholecystectomy    . Eye surgery Left retinal   Family History  Problem Relation Age of Onset  . Diabetes Father   . Heart attack Father    History  Substance Use Topics  .  Smoking status: Never Smoker   . Smokeless tobacco: Never Used  . Alcohol Use: No   OB History   Grav Para Term Preterm Abortions TAB SAB Ect Mult Living                 Review of Systems  Constitutional: Negative for fever, diaphoresis, appetite change, fatigue and unexpected weight change.  HENT: Negative for mouth sores.   Eyes: Negative for visual disturbance.  Respiratory: Negative for cough, chest tightness, shortness of breath and wheezing.   Cardiovascular: Positive for chest pain.  Gastrointestinal: Positive for abdominal pain (epigastric). Negative for nausea, vomiting, diarrhea and constipation.  Endocrine: Positive for polydipsia and polyuria. Negative for polyphagia.  Genitourinary: Positive for dysuria. Negative for urgency, frequency and hematuria.  Musculoskeletal: Negative for back pain and neck stiffness.  Skin: Negative for rash.  Allergic/Immunologic: Negative for immunocompromised state.  Neurological: Negative for syncope, light-headedness and headaches.  Hematological: Does not bruise/bleed easily.  Psychiatric/Behavioral: Negative for sleep disturbance. The patient is not nervous/anxious.       Allergies  Review of patient's allergies indicates no known allergies.  Home Medications   Current Outpatient Rx  Name  Route  Sig  Dispense  Refill  . acetaminophen-codeine (TYLENOL #3) 300-30 MG per tablet   Oral   Take 1 tablet by mouth 3 (three) times daily as needed for moderate pain.   40 tablet   0   . fish oil-omega-3 fatty acids 1000 MG capsule   Oral   Take 2 capsules (2  g total) by mouth daily.   30 capsule   3   . gabapentin (NEURONTIN) 100 MG capsule   Oral   Take 100 mg by mouth 3 (three) times daily.         . insulin aspart (NOVOLOG) 100 UNIT/ML injection   Subcutaneous   Inject 20 Units into the skin once.   10 mL   11   . insulin glargine (LANTUS) 100 UNIT/ML injection   Subcutaneous   Inject 0.4 mLs (40 Units total) into  the skin 2 (two) times daily.   10 mL   10   . lisinopril (PRINIVIL,ZESTRIL) 10 MG tablet   Oral   Take 1 tablet (10 mg total) by mouth daily.   90 tablet   3   . metFORMIN (GLUCOPHAGE) 1000 MG tablet   Oral   Take 1 tablet (1,000 mg total) by mouth 2 (two) times daily with a meal. Take daily for 7 days, and then increase to twice daily.   60 tablet   2   . naproxen (NAPROSYN) 500 MG tablet   Oral   Take 1 tablet (500 mg total) by mouth 2 (two) times daily with a meal.   30 tablet   0   . pregabalin (LYRICA) 100 MG capsule   Oral   Take 1 capsule (100 mg total) by mouth 3 (three) times daily after meals.   90 capsule   1   . traMADol (ULTRAM) 50 MG tablet   Oral   Take 25-50 mg by mouth 2 (two) times daily as needed for moderate pain.         . cephALEXin (KEFLEX) 500 MG capsule   Oral   Take 1 capsule (500 mg total) by mouth 4 (four) times daily.   40 capsule   0   . glucose blood test strip      Use as instructed   100 each   12    BP 141/89  Pulse 62  Temp(Src) 98.5 F (36.9 C)  Resp 17  SpO2 100% Physical Exam  Nursing note and vitals reviewed. Constitutional: She is oriented to person, place, and time. She appears well-developed and well-nourished. No distress.  Awake, alert, nontoxic appearance  HENT:  Head: Normocephalic and atraumatic.  Mouth/Throat: Oropharynx is clear and moist. No oropharyngeal exudate.  Eyes: Conjunctivae are normal. No scleral icterus.  Neck: Normal range of motion. Neck supple.  Cardiovascular: Normal rate, regular rhythm and intact distal pulses.   Regular rate and rhythm  Pulmonary/Chest: Effort normal and breath sounds normal. No respiratory distress. She has no wheezes.  Clear and equal breath sounds  Abdominal: Soft. Bowel sounds are normal. She exhibits no distension and no mass. There is no tenderness. There is no rebound and no guarding.  Abdomen soft and nontender  Musculoskeletal: Normal range of motion. She  exhibits no edema.  Lymphadenopathy:    She has no cervical adenopathy.  Neurological: She is alert and oriented to person, place, and time. She exhibits normal muscle tone. Coordination normal.  Speech is clear and goal oriented Moves extremities without ataxia  Skin: Skin is warm and dry. No rash noted. She is not diaphoretic. No erythema.  Psychiatric: She has a normal mood and affect. Her behavior is normal.    ED Course  Procedures (including critical care time) Labs Review Labs Reviewed  URINALYSIS, ROUTINE W REFLEX MICROSCOPIC - Abnormal; Notable for the following:    APPearance CLOUDY (*)    Specific  Gravity, Urine 1.034 (*)    Glucose, UA >1000 (*)    Hgb urine dipstick SMALL (*)    Leukocytes, UA SMALL (*)    All other components within normal limits  CBC - Abnormal; Notable for the following:    Platelets 134 (*)    All other components within normal limits  COMPREHENSIVE METABOLIC PANEL - Abnormal; Notable for the following:    Glucose, Bld 453 (*)    Albumin 3.0 (*)    Total Bilirubin 0.2 (*)    All other components within normal limits  URINE MICROSCOPIC-ADD ON - Abnormal; Notable for the following:    Squamous Epithelial / LPF MANY (*)    Bacteria, UA FEW (*)    All other components within normal limits  GLUCOSE, CAPILLARY - Abnormal; Notable for the following:    Glucose-Capillary 392 (*)    All other components within normal limits  GLUCOSE, CAPILLARY - Abnormal; Notable for the following:    Glucose-Capillary 275 (*)    All other components within normal limits  URINE CULTURE  TROPONIN I  TROPONIN I   Imaging Review No results found.  EKG Interpretation    Date/Time:  Tuesday August 14 2013 16:48:16 EST Ventricular Rate:  68 PR Interval:  156 QRS Duration: 92 QT Interval:  401 QTC Calculation: 426 R Axis:   18 Text Interpretation:  Sinus rhythm Low voltage, precordial leads RSR' in V1 or V2, right VCD or RVH Confirmed by GOLDSTON  MD, SCOTT  ([id number]) on 08/14/2013 6:57:51 PM            MDM   Final diagnoses:  Hyperglycemia without ketosis  IDDM (insulin dependent diabetes mellitus)  Dysuria  UTI (lower urinary tract infection)  Chest pain, atypical    [name] [name] presents from an [facility] for hyperglycemia and hyperkalemia on scheduled recheck.  CMP drawn yesterday showed hyperkalemia at 6.2 but was noted to be hemolyzed.  Patient endorsing polydipsia and polyuria.  Will repeat labs and check for infection.  Patient reports a history of being hospitalized due to her diabetes but is unsure of the last time brief she has never had DKA.  7:02 PM Pt CBG decreasing with fluids.  No hyperkalemia on CMP, CBC without leukocytosis. Urinalysis contaminated, but has 7-10 white blood cells, small hemoglobin and small leuks.  No ketones. Anion gap 13. No evidence of DKA.  Chest pain atypical, described as sharp and lasting only 10-30 seconds.  Record review shows the patient was evaluated for a similar episode on 07/30/2011 and was encouraged to followup with primary care and cardiology. She did followup with primary care that did not have an opportunity to followup with cardiology.  Patient's ECG was nonischemic, T wave flattening noted and compared to previous ECG but unchanged from ECG prior to that.  Patient is chest pain-free at this time and remains that way.    Will give Rocephin 1g IV to treat possible UTI.  Patient has UTI this may be the cause of her hyperglycemia.  Pt was given her dinner time insulin, allowed to eat and fluids completed.  Repeat CBG 275.  Pt has f/u appointment in 48 hours with her PCP and reports she will keep this appointment.    It has been determined that no acute conditions requiring further emergency intervention are present at this time. The patient/guardian have been advised of the diagnosis and plan. We have discussed signs and symptoms that warrant return to the ED, such  as changes or  worsening in symptoms.   Vital signs are stable at discharge.   BP 141/89  Pulse 62  Temp(Src) 98.5 F (36.9 C)  Resp 17  SpO2 100%  Patient/guardian has voiced understanding and agreed to follow-up with the PCP or specialist.      Abigail Butts, PA-C 08/15/13 0234

## 2013-08-14 NOTE — Telephone Encounter (Signed)
Received a call from [facility] regarding patient blood sugar Critical high at [phone]- Dr Doreene Burke made aware Called patient and left message on voice mail that we need her to return to  Office to repeat her blood sugar today

## 2013-08-14 NOTE — Addendum Note (Signed)
Addended by: Allyson Sabal MD, Ascencion Dike on: 08/14/2013 03:03 PM   Modules accepted: Orders

## 2013-08-14 NOTE — Discharge Instructions (Signed)
1. Medications: usual home medications 2. Treatment: rest, drink plenty of fluids,  3. Follow Up: Please followup with your primary doctor for discussion of your diagnoses and further evaluation after today's visit; is also followup with [facility] cardiology for further evaluation of your chest pain  Hyperglycemia Hyperglycemia occurs when the glucose (sugar) in your blood is too high. Hyperglycemia can happen for many reasons, but it most often happens to people who do not know they have diabetes or are not managing their diabetes properly.  CAUSES  Whether you have diabetes or not, there are other causes of hyperglycemia. Hyperglycemia can occur when you have diabetes, but it can also occur in other situations that you might not be as aware of, such as: Diabetes  If you have diabetes and are having problems controlling your blood glucose, hyperglycemia could occur because of some of the following reasons:  Not following your meal plan.  Not taking your diabetes medications or not taking it properly.  Exercising less or doing less activity than you normally do.  Being sick. Pre-diabetes  This cannot be ignored. Before people develop Type 2 diabetes, they almost always have "pre-diabetes." This is when your blood glucose levels are higher than normal, but not yet high enough to be diagnosed as diabetes. Research has shown that some long-term damage to the body, especially the heart and circulatory system, may already be occurring during pre-diabetes. If you take action to manage your blood glucose when you have pre-diabetes, you may delay or prevent Type 2 diabetes from developing. Stress  If you have diabetes, you may be "diet" controlled or on oral medications or insulin to control your diabetes. However, you may find that your blood glucose is higher than usual in the hospital whether you have diabetes or not. This is often referred to as "stress hyperglycemia." Stress can elevate your blood  glucose. This happens because of hormones put out by the body during times of stress. If stress has been the cause of your high blood glucose, it can be followed regularly by your caregiver. That way he/she can make sure your hyperglycemia does not continue to get worse or progress to diabetes. Steroids  Steroids are medications that act on the infection fighting system (immune system) to block inflammation or infection. One side effect can be a rise in blood glucose. Most people can produce enough extra insulin to allow for this rise, but for those who cannot, steroids make blood glucose levels go even higher. It is not unusual for steroid treatments to "uncover" diabetes that is developing. It is not always possible to determine if the hyperglycemia will go away after the steroids are stopped. A special blood test called an A1c is sometimes done to determine if your blood glucose was elevated before the steroids were started. SYMPTOMS  Thirsty.  Frequent urination.  Dry mouth.  Blurred vision.  Tired or fatigue.  Weakness.  Sleepy.  Tingling in feet or leg. DIAGNOSIS  Diagnosis is made by monitoring blood glucose in one or all of the following ways:  A1c test. This is a chemical found in your blood.  Fingerstick blood glucose monitoring.  Laboratory results. TREATMENT  First, knowing the cause of the hyperglycemia is important before the hyperglycemia can be treated. Treatment may include, but is not be limited to:  Education.  Change or adjustment in medications.  Change or adjustment in meal plan.  Treatment for an illness, infection, etc.  More frequent blood glucose monitoring.  Change in  exercise plan.  Decreasing or stopping steroids.  Lifestyle changes. HOME CARE INSTRUCTIONS   Test your blood glucose as directed.  Exercise regularly. Your caregiver will give you instructions about exercise. Pre-diabetes or diabetes which comes on with stress is helped by  exercising.  Eat wholesome, balanced meals. Eat often and at regular, fixed times. Your caregiver or nutritionist will give you a meal plan to guide your sugar intake.  Being at an ideal weight is important. If needed, losing as little as 10 to 15 pounds may help improve blood glucose levels. SEEK MEDICAL CARE IF:   You have questions about medicine, activity, or diet.  You continue to have symptoms (problems such as increased thirst, urination, or weight gain). SEEK IMMEDIATE MEDICAL CARE IF:   You are vomiting or have diarrhea.  Your breath smells fruity.  You are breathing faster or slower.  You are very sleepy or incoherent.  You have numbness, tingling, or pain in your feet or hands.  You have chest pain.  Your symptoms get worse even though you have been following your caregiver's orders.  If you have any other questions or concerns. Document Released: 12/15/2000 Document Revised: 09/13/2011 Document Reviewed: 10/18/2011 [facility] Patient Information 2014 [place], [place]. Chest Pain (Nonspecific) It is often hard to give a specific diagnosis for the cause of chest pain. There is always a chance that your pain could be related to something serious, such as a heart attack or a blood clot in the lungs. You need to follow up with your caregiver for further evaluation. CAUSES   Heartburn.  Pneumonia or bronchitis.  Anxiety or stress.  Inflammation around your heart (pericarditis) or lung (pleuritis or pleurisy).  A blood clot in the lung.  A collapsed lung (pneumothorax). It can develop suddenly on its own (spontaneous pneumothorax) or from injury (trauma) to the chest.  Shingles infection (herpes zoster virus). The chest wall is composed of bones, muscles, and cartilage. Any of these can be the source of the pain.  The bones can be bruised by injury.  The muscles or cartilage can be strained by coughing or overwork.  The cartilage can be affected by inflammation  and become sore (costochondritis). DIAGNOSIS  Lab tests or other studies, such as X-rays, electrocardiography, stress testing, or cardiac imaging, may be needed to find the cause of your pain.  TREATMENT   Treatment depends on what may be causing your chest pain. Treatment may include:  Acid blockers for heartburn.  Anti-inflammatory medicine.  Pain medicine for inflammatory conditions.  Antibiotics if an infection is present.  You may be advised to change lifestyle habits. This includes stopping smoking and avoiding alcohol, caffeine, and chocolate.  You may be advised to keep your head raised (elevated) when sleeping. This reduces the chance of acid going backward from your stomach into your esophagus.  Most of the time, nonspecific chest pain will improve within 2 to 3 days with rest and mild pain medicine. HOME CARE INSTRUCTIONS   If antibiotics were prescribed, take your antibiotics as directed. Finish them even if you start to feel better.  For the next few days, avoid physical activities that bring on chest pain. Continue physical activities as directed.  Do not smoke.  Avoid drinking alcohol.  Only take over-the-counter or prescription medicine for pain, discomfort, or fever as directed by your caregiver.  Follow your caregiver's suggestions for further testing if your chest pain does not go away.  Keep any follow-up appointments you made. If you  do not go to an appointment, you could develop lasting (chronic) problems with pain. If there is any problem keeping an appointment, you must call to reschedule. SEEK MEDICAL CARE IF:   You think you are having problems from the medicine you are taking. Read your medicine instructions carefully.  Your chest pain does not go away, even after treatment.  You develop a rash with blisters on your chest. SEEK IMMEDIATE MEDICAL CARE IF:   You have increased chest pain or pain that spreads to your arm, neck, jaw, back, or  abdomen.  You develop shortness of breath, an increasing cough, or you are coughing up blood.  You have severe back or abdominal pain, feel nauseous, or vomit.  You develop severe weakness, fainting, or chills.  You have a fever. THIS IS AN EMERGENCY. Do not wait to see if the pain will go away. Get medical help at once. Call your local emergency services (911 in [place] Do not drive yourself to the hospital. MAKE SURE YOU:   Understand these instructions.  Will watch your condition.  Will get help right away if you are not doing well or get worse. Document Released: 03/31/2005 Document Revised: 09/13/2011 Document Reviewed: 01/25/2008 [facility] Patient Information 2014 [place].

## 2013-08-14 NOTE — Progress Notes (Unsigned)
Pt comes in for repeat CMP blood sugar 487 BP 168/97. Pt c/o episode left chest wall pain radiating down left breast to back lasting 30 seconds which resolved now Right AC Iv placed running normal saline without diff Pt alert,awake, denies pain at this time Report given to Charge nurse Sabrina Pt in stable condition

## 2013-08-15 ENCOUNTER — Emergency Department (HOSPITAL_COMMUNITY)
Admission: EM | Admit: 2013-08-15 | Discharge: 2013-08-15 | Disposition: A | Discharge status: Home / Self Care | Payer: Managed Care, Other (non HMO) | Attending: Emergency Medicine | Admitting: Emergency Medicine

## 2013-08-15 ENCOUNTER — Encounter (HOSPITAL_COMMUNITY): Payer: Self-pay | Admitting: Emergency Medicine

## 2013-08-15 ENCOUNTER — Telehealth: Payer: Self-pay

## 2013-08-15 DIAGNOSIS — Z792 Long term (current) use of antibiotics: Secondary | ICD-10-CM | POA: Insufficient documentation

## 2013-08-15 DIAGNOSIS — Z791 Long term (current) use of non-steroidal anti-inflammatories (NSAID): Secondary | ICD-10-CM | POA: Insufficient documentation

## 2013-08-15 DIAGNOSIS — N390 Urinary tract infection, site not specified: Secondary | ICD-10-CM | POA: Insufficient documentation

## 2013-08-15 DIAGNOSIS — Z79899 Other long term (current) drug therapy: Secondary | ICD-10-CM | POA: Insufficient documentation

## 2013-08-15 DIAGNOSIS — E1142 Type 2 diabetes mellitus with diabetic polyneuropathy: Secondary | ICD-10-CM | POA: Insufficient documentation

## 2013-08-15 DIAGNOSIS — R739 Hyperglycemia, unspecified: Secondary | ICD-10-CM

## 2013-08-15 DIAGNOSIS — G8929 Other chronic pain: Secondary | ICD-10-CM | POA: Insufficient documentation

## 2013-08-15 DIAGNOSIS — I10 Essential (primary) hypertension: Secondary | ICD-10-CM | POA: Insufficient documentation

## 2013-08-15 DIAGNOSIS — E1149 Type 2 diabetes mellitus with other diabetic neurological complication: Secondary | ICD-10-CM | POA: Insufficient documentation

## 2013-08-15 DIAGNOSIS — Z794 Long term (current) use of insulin: Secondary | ICD-10-CM | POA: Insufficient documentation

## 2013-08-15 LAB — BASIC METABOLIC PANEL
BUN: 9 mg/dL (ref 6–23)
CO2: 22 mEq/L (ref 19–32)
Calcium: 8.4 mg/dL (ref 8.4–10.5)
Chloride: 101 mEq/L (ref 96–112)
Creatinine, Ser: 0.49 mg/dL — ABNORMAL LOW (ref 0.50–1.10)
GFR calc Af Amer: >90 mL/min (ref >90)
GFR calc non Af Amer: >90 mL/min (ref >90)
Glucose, Bld: 459 mg/dL — ABNORMAL HIGH (ref 70–99)
Potassium: 4.2 mEq/L (ref 3.7–5.3)
Sodium: 138 mEq/L (ref 137–147)

## 2013-08-15 LAB — URINE MICROSCOPIC-ADD ON

## 2013-08-15 LAB — CBC WITH DIFFERENTIAL/PLATELET
Basophils Absolute: 0 10*3/uL (ref 0.0–0.1)
Basophils Relative: 0 % (ref 0–1)
Eosinophils Absolute: 0.2 10*3/uL (ref 0.0–0.7)
Eosinophils Relative: 3 % (ref 0–5)
HCT: 38.3 % (ref 36.0–46.0)
Hemoglobin: 13.1 g/dL (ref 12.0–15.0)
Lymphocytes Relative: 37 % (ref 12–46)
Lymphs Abs: 2.3 10*3/uL (ref 0.7–4.0)
MCH: 27.5 pg (ref 26.0–34.0)
MCHC: 34.2 g/dL (ref 30.0–36.0)
MCV: 80.5 fL (ref 78.0–100.0)
Monocytes Absolute: 0.5 10*3/uL (ref 0.1–1.0)
Monocytes Relative: 9 % (ref 3–12)
Neutro Abs: 3.2 10*3/uL (ref 1.7–7.7)
Neutrophils Relative %: 52 % (ref 43–77)
Platelets: 172 10*3/uL (ref 150–400)
RBC: 4.76 MIL/uL (ref 3.87–5.11)
RDW: 13.2 % (ref 11.5–15.5)
WBC: 6.2 10*3/uL (ref 4.0–10.5)

## 2013-08-15 LAB — URINALYSIS, ROUTINE W REFLEX MICROSCOPIC
Bilirubin Urine: NEGATIVE
Glucose, UA: >1000 mg/dL — AB
Ketones, ur: NEGATIVE mg/dL
Nitrite: NEGATIVE
Protein, ur: NEGATIVE mg/dL
Specific Gravity, Urine: 1.027 (ref 1.005–1.030)
Urobilinogen, UA: 0.2 mg/dL (ref 0.0–1.0)
pH: 6.5 (ref 5.0–8.0)

## 2013-08-15 LAB — GLUCOSE, CAPILLARY
Glucose-Capillary: 359 mg/dL — ABNORMAL HIGH (ref 70–99)
Glucose-Capillary: 244 mg/dL — ABNORMAL HIGH (ref 70–99)

## 2013-08-15 LAB — COMPREHENSIVE METABOLIC PANEL
ALT: 10 U/L (ref 0–35)
AST: 12 U/L (ref 0–37)
Albumin: 3.6 g/dL (ref 3.5–5.2)
Alkaline Phosphatase: 104 U/L (ref 39–117)
BUN: 8 mg/dL (ref 6–23)
CO2: 27 mEq/L (ref 19–32)
Calcium: 8.6 mg/dL (ref 8.4–10.5)
Chloride: 100 mEq/L (ref 96–112)
Creat: 0.73 mg/dL (ref 0.50–1.10)
Glucose, Bld: 560 mg/dL (ref 70–99)
Potassium: 4.6 mEq/L (ref 3.5–5.3)
Sodium: 132 mEq/L — ABNORMAL LOW (ref 135–145)
Total Bilirubin: 0.3 mg/dL (ref 0.2–1.2)
Total Protein: 6 g/dL (ref 6.0–8.3)

## 2013-08-15 LAB — CK: Total CK: 41 U/L (ref 7–177)

## 2013-08-15 MED ORDER — SODIUM CHLORIDE 0.9 % IV SOLN
1000.0000 mL | INTRAVENOUS | Status: DC
  Administered 2013-08-15: 1000 mL via INTRAVENOUS

## 2013-08-15 MED ORDER — CEPHALEXIN 500 MG PO CAPS: 500.0000 mg | ORAL_CAPSULE | Freq: Four times a day (QID) | ORAL | Status: DC

## 2013-08-15 MED ORDER — SODIUM CHLORIDE 0.9 % IV SOLN
1000.0000 mL | Freq: Once | INTRAVENOUS | Status: AC
  Administered 2013-08-15: 1000 mL via INTRAVENOUS

## 2013-08-15 MED ORDER — INSULIN ASPART 100 UNIT/ML ~~LOC~~ SOLN
10.0000 [IU] | Freq: Once | SUBCUTANEOUS | Status: AC
  Administered 2013-08-15: 10 [IU] via INTRAVENOUS
  Filled 2013-08-15: qty 1

## 2013-08-15 NOTE — Progress Notes (Signed)
Pt called and informed to come to clinic asap. Pt was seen and sent to ER for CBG 467

## 2013-08-15 NOTE — ED Notes (Signed)
Pt. reports elevated blood sugar 430  this morning ( 4am ) at home . Pt. was seen here last night for the same complaint / discharged home .

## 2013-08-15 NOTE — ED Provider Notes (Signed)
Medical screening examination/treatment/procedure(s) were performed by non-physician practitioner and as supervising physician I was immediately available for consultation/collaboration.  EKG Interpretation    Date/Time:  Tuesday August 14 2013 16:48:16 EST Ventricular Rate:  68 PR Interval:  156 QRS Duration: 92 QT Interval:  401 QTC Calculation: 426 R Axis:   18 Text Interpretation:  Sinus rhythm Low voltage, precordial leads RSR' in V1 or V2, right VCD or RVH Confirmed by Brennah Quraishi  MD, Nathanie Ottley ([id number]) on 08/14/2013 6:57:51 PM              Ephraim Hamburger, MD 08/15/13 1114

## 2013-08-15 NOTE — ED Provider Notes (Signed)
CSN: [id number]     Arrival date & time 08/15/13  0554 History   First MD Initiated Contact with Patient 08/15/13 [phone]     Chief Complaint  Patient presents with  . Hyperglycemia     (Consider location/radiation/quality/duration/timing/severity/associated sxs/prior Treatment) Patient is a 48 y.o. female presenting with hyperglycemia. The history is provided by the patient.  Hyperglycemia She has been having difficulty with elevated blood sugars recently and saw her PCP last week who adjusted her Lantus insulin from 50 units at bedtime to 20 units twice a day. At the same time, she had her metformin increased to 1000 mg twice a day and had lisinopril added to her regimen. She continues to have a blood sugars which are over 400. 2 days ago, a routine blood draw showed a potassium of 6.2 and she was told to come to the ED immediately. That was yesterday and she stayed in the ED received IV fluids and insulin and was discharged. After getting home, she received a phone call at 4 AM telling her that she needed to come back to the ED. She was told to check her blood sugar and it was over 400 at that time. She denies fever, chills, sweats. She denies nausea, vomiting. She is having chronic pain in her legs which has been diagnosed as diabetic neuropathy.  Past Medical History  Diagnosis Date  . Diabetes mellitus   . Hypertension    Past Surgical History  Procedure Laterality Date  . Abdominal hysterectomy    . Tubal ligation    . Tonsillectomy    . Cesarean section    . Ganglion cyst excision    . Cholecystectomy    . Eye surgery Left retinal   Family History  Problem Relation Age of Onset  . Diabetes Father   . Heart attack Father    History  Substance Use Topics  . Smoking status: Never Smoker   . Smokeless tobacco: Never Used  . Alcohol Use: No   OB History   Grav Para Term Preterm Abortions TAB SAB Ect Mult Living                 Review of Systems  All other systems  reviewed and are negative.      Allergies  Review of patient's allergies indicates no known allergies.  Home Medications   Current Outpatient Rx  Name  Route  Sig  Dispense  Refill  . acetaminophen-codeine (TYLENOL #3) 300-30 MG per tablet   Oral   Take 1 tablet by mouth 3 (three) times daily as needed for moderate pain.   40 tablet   0   . cephALEXin (KEFLEX) 500 MG capsule   Oral   Take 1 capsule (500 mg total) by mouth 4 (four) times daily.   40 capsule   0   . fish oil-omega-3 fatty acids 1000 MG capsule   Oral   Take 2 capsules (2 g total) by mouth daily.   30 capsule   3   . gabapentin (NEURONTIN) 100 MG capsule   Oral   Take 100 mg by mouth 3 (three) times daily.         Marland Kitchen glucose blood test strip      Use as instructed   100 each   12   . insulin aspart (NOVOLOG) 100 UNIT/ML injection   Subcutaneous   Inject 20 Units into the skin once.   10 mL   11   . insulin glargine (LANTUS)  100 UNIT/ML injection   Subcutaneous   Inject 0.4 mLs (40 Units total) into the skin 2 (two) times daily.   10 mL   10   . lisinopril (PRINIVIL,ZESTRIL) 10 MG tablet   Oral   Take 1 tablet (10 mg total) by mouth daily.   90 tablet   3   . metFORMIN (GLUCOPHAGE) 1000 MG tablet   Oral   Take 1 tablet (1,000 mg total) by mouth 2 (two) times daily with a meal. Take daily for 7 days, and then increase to twice daily.   60 tablet   2   . naproxen (NAPROSYN) 500 MG tablet   Oral   Take 1 tablet (500 mg total) by mouth 2 (two) times daily with a meal.   30 tablet   0   . pregabalin (LYRICA) 100 MG capsule   Oral   Take 1 capsule (100 mg total) by mouth 3 (three) times daily after meals.   90 capsule   1   . traMADol (ULTRAM) 50 MG tablet   Oral   Take 25-50 mg by mouth 2 (two) times daily as needed for moderate pain.          BP 169/95  Pulse 70  Temp(Src) 98.4 F (36.9 C) (Oral)  Resp 18  SpO2 100% Physical Exam  Nursing note and vitals  reviewed.  48 year old female, resting comfortably and in no acute distress. Vital signs are significant for hypertension with blood pressure 169/95. Oxygen saturation is 100%, which is normal. Head is normocephalic and atraumatic. PERRLA, EOMI. Oropharynx is clear. Neck is nontender and supple without adenopathy or JVD. Back is nontender and there is no CVA tenderness. Lungs are clear without rales, wheezes, or rhonchi. Chest is nontender. Heart has regular rate and rhythm without murmur. Abdomen is soft, flat, nontender without masses or hepatosplenomegaly and peristalsis is normoactive. Extremities have no cyanosis or edema, full range of motion is present. Skin is warm and dry without rash. Neurologic: Mental status is normal, cranial nerves are intact, there are no motor deficits. She has stocking glove hypoesthesia consistent with diabetic peripheral neuropathy.  ED Course  Procedures (including critical care time) Labs Review Results for orders placed during the hospital encounter of 08/15/13  GLUCOSE, CAPILLARY      Result Value Ref Range   Glucose-Capillary 359 (*) 70 - 99 mg/dL  CBC WITH DIFFERENTIAL      Result Value Ref Range   WBC 6.2  4.0 - 10.5 K/uL   RBC 4.76  3.87 - 5.11 MIL/uL   Hemoglobin 13.1  12.0 - 15.0 g/dL   HCT 38.3  36.0 - 46.0 %   MCV 80.5  78.0 - 100.0 fL   MCH 27.5  26.0 - 34.0 pg   MCHC 34.2  30.0 - 36.0 g/dL   RDW 13.2  11.5 - 15.5 %   Platelets 172  150 - 400 K/uL   Neutrophils Relative % 52  43 - 77 %   Neutro Abs 3.2  1.7 - 7.7 K/uL   Lymphocytes Relative 37  12 - 46 %   Lymphs Abs 2.3  0.7 - 4.0 K/uL   Monocytes Relative 9  3 - 12 %   Monocytes Absolute 0.5  0.1 - 1.0 K/uL   Eosinophils Relative 3  0 - 5 %   Eosinophils Absolute 0.2  0.0 - 0.7 K/uL   Basophils Relative 0  0 - 1 %   Basophils Absolute 0.0  0.0 - 0.1 K/uL  BASIC METABOLIC PANEL      Result Value Ref Range   Sodium 138  137 - 147 mEq/L   Potassium 4.2  3.7 - 5.3 mEq/L    Chloride 101  96 - 112 mEq/L   CO2 22  19 - 32 mEq/L   Glucose, Bld 459 (*) 70 - 99 mg/dL   BUN 9  6 - 23 mg/dL   Creatinine, Ser 0.49 (*) 0.50 - 1.10 mg/dL   Calcium 8.4  8.4 - 10.5 mg/dL   GFR calc non Af Amer >90  >90 mL/min   GFR calc Af Amer >90  >90 mL/min  URINALYSIS, ROUTINE W REFLEX MICROSCOPIC      Result Value Ref Range   Color, Urine YELLOW  YELLOW   APPearance HAZY (*) CLEAR   Specific Gravity, Urine 1.027  1.005 - 1.030   pH 6.5  5.0 - 8.0   Glucose, UA >1000 (*) NEGATIVE mg/dL   Hgb urine dipstick TRACE (*) NEGATIVE   Bilirubin Urine NEGATIVE  NEGATIVE   Ketones, ur NEGATIVE  NEGATIVE mg/dL   Protein, ur NEGATIVE  NEGATIVE mg/dL   Urobilinogen, UA 0.2  0.0 - 1.0 mg/dL   Nitrite NEGATIVE  NEGATIVE   Leukocytes, UA TRACE (*) NEGATIVE  CK      Result Value Ref Range   Total CK 41  7 - 177 U/L  GLUCOSE, CAPILLARY      Result Value Ref Range   Glucose-Capillary 244 (*) 70 - 99 mg/dL  URINE MICROSCOPIC-ADD ON      Result Value Ref Range   Squamous Epithelial / LPF MANY (*) RARE   WBC, UA 11-20  <3 WBC/hpf   RBC / HPF 3-6  <3 RBC/hpf   Bacteria, UA FEW (*) RARE   MDM   Final diagnoses:  Hyperglycemia  UTI (urinary tract infection)    Persistent hyperglycemia without ketosis. I reviewed her prior records and recent labs and the potassium is 6.2 was probably secondary to hemolysis since potassium was normal when she arrived in the ED the next day and without any specific treatment. Electrolytes will be rechecked today. I suspect that she needs to be on a higher dose of Lantus since her blood sugars continued to be uncontrolled with the split dose (please note that this but does actually included a drop of the daily dose by 10 units). She'll be given additional IV fluid and insulin and anticipate discharging her with instructions to increase her Lantus to 25 units twice a day. Blood pressure is also elevated and she will need to work with her PCP to keep that adequately  controlled.    Delora Fuel, MD [phone]

## 2013-08-15 NOTE — ED Notes (Signed)
Pt attempting to void.  

## 2013-08-15 NOTE — Telephone Encounter (Signed)
Patient is requesting a refill on Tramadol. After reviewing chart patient needs appt. Called to inform patient she will need appt before we could refill the Tramadol, and she said she will call the clinic back to schedule.

## 2013-08-15 NOTE — Discharge Instructions (Signed)
°Emergency Department Resource Guide °1) Find a Doctor and Pay Out of Pocket °Although you won't have to find out who is covered by your insurance plan, it is a good idea to ask around and get recommendations. You will then need to call the office and see if the doctor you have chosen will accept you as a new patient and what types of options they offer for patients who are self-pay. Some doctors offer discounts or will set up payment plans for their patients who do not have insurance, but you will need to ask so you aren't surprised when you get to your appointment. ° °2) Contact Your Local Health Department °Not all health departments have doctors that can see patients for sick visits, but many do, so it is worth a call to see if yours does. If you don't know where your local health department is, you can check in your phone book. The CDC also has a tool to help you locate your state's health department, and many state websites also have listings of all of their local health departments. ° °3) Find a Walk-in Clinic °If your illness is not likely to be very severe or complicated, you may want to try a walk in clinic. These are popping up all over the country in pharmacies, drugstores, and shopping centers. They're usually staffed by nurse practitioners or physician assistants that have been trained to treat common illnesses and complaints. They're usually fairly quick and inexpensive. However, if you have serious medical issues or chronic medical problems, these are probably not your best option. ° °No Primary Care Doctor: °- Call [facility] at  832-8000 - they can help you locate a primary care doctor that  accepts your insurance, provides certain services, etc. °- Physician Referral Service- 1-800-533-3463 ° °Chronic Pain Problems: °Organization         Address  Phone   Notes  °[facility]  ([phone]) [phone] Patients need to be referred by their primary care doctor.  ° °Medication  Assistance: °Organization         Address  Phone   Notes  °[place] [facility] [address] °[place], [place] [address] (336) 641-8030 --Must be a resident of [place] °-- Must have NO insurance coverage whatsoever (no Medicaid/ Medicare, etc.) °-- The pt. MUST have a primary care doctor that directs their care regularly and follows them in the community °  °[facility]  (866) 331-1348   °[employer]  (888) 892-1162   ° °Agencies that provide inexpensive medical care: °Organization         Address  Phone   Notes  °[facility]  (336) 832-8035   °[facility]    (336) 832-7272   °[facility] [address] °[place], [place] [address] (336) 832-4777   °[facility] [address], °[place] (336) 271-4999   °[facility]    (336) 373-0678   °[facility]    (336) 272-1050   °[facility] ° [address],  Phone:  (336) 832-4444, Fax:  (336) 832-4440 Hours of Operation:  9 am - 6 pm, M-F.  Also accepts Medicaid/Medicare and self-pay.  °[facility] ° [address],  Phone: (336) 832-3150, Fax: (336) [phone]. Hours of Operation:  8:30 am - 5:30 pm, M-F.  Also accepts Medicaid and self-pay.  °[facility] [place] [address], [place] Phone: (336) 878-6027   °[facility] [address], [place], [place] (336)723-1848, Ext. 123 Mondays & Thursdays: 7-9 AM.  First 15 patients are seen on a first come, first serve basis. °  ° °Medicaid-accepting [place] Providers: ° °Organization         Address  Phone   Notes  °[facility] [address], [place] (336) 641-2100 Also accepts self-pay patients.  °[facility] [address], [place] ° (336) 856-9996   °[facility] [address], [place]  (336) 288-8857   °[facility] [address] [place] [address], [place] (336) 299-7000   °Veita Bland [address], [place]  ° (336) 373-1557 Only accepts [place] Access Medicaid patients after they have their name applied to their card.  ° °Self-Pay (no insurance) in [place]: ° °Organization         Address  Phone   Notes  °Sickle Cell Patients, [facility] [address], [place] (336) 832-1970   °[facility] [address], [place] (336) 832-4400   °[facility] ° [address], [place] (336) 992-4800   °[facility]/Dr. Osei-Bonsu ° [address] [place] [address], [place] or [address], [place] (336) [phone] Phone number for both [place] and [place] locations is the same.  °[facility] [address], [place] (336) 299-0000   °[facility] [address] [place] [address], [place] or [address] (336) 852-7530 °(336) 878-2260   °[facility] [address], [place] (336) 350-1642, phone; (336) 294-5005, fax Sees patients 1st and 3rd Saturday of every month.  Must not qualify for public or private insurance (i.e. Medicaid, Medicare, [place] Health Choice, Veterans' Benefits) • Household income should be no more than 200% of the poverty level •The clinic cannot treat you if you are pregnant or think you are pregnant • Sexually transmitted diseases are not treated at the clinic.  ° ° °Dental Care: °Organization         Address  Phone  Notes  °[place] [facility] [address], [place] (336) 641-6152 Accepts children up to age 21 who are enrolled in Medicaid or [place] Health Choice; pregnant women with a Medicaid card; and children who have applied for Medicaid or [place] Health Choice, but were declined, whose parents can pay a reduced fee at time of service.  °[place]  [facility] [place]  [address], [place] (336) 641-7733 Accepts children up to age 21 who are enrolled in Medicaid or [place] Health Choice; pregnant women with a Medicaid card; and children who have applied for Medicaid or [place] Health Choice, but were declined, whose parents can pay a reduced fee at time of service.  °[facility] ° [address], [place] (336) 641-4533 Patients are seen by appointment only. Walk-ins are not accepted. [facility] will see patients 18 years of age and older. °Monday - Tuesday (8am-5pm) °Most Wednesdays (8:30-5pm) °$30 per visit, cash only  °[facility] ° [address], [place] (336) 641-4533 Patients are seen by appointment only. Walk-ins are not accepted. [facility] will see patients 18 years of age and older. °One   Wednesday Evening (Monthly: Volunteer Based).  $30 per visit, cash only  °[facility]  (919) 537-3737 for adults; Children under age 4, call Graduate Pediatric Dentistry at (919) [phone]. Children aged 4-14, please call ([phone]) [phone] to request a pediatric application. ° Dental services are provided in all areas of dental care including fillings, crowns and bridges, complete and partial dentures, implants, gum treatment, root canals, and extractions. Preventive care is also provided. Treatment is provided to both adults and children. °Patients are selected via a lottery and there is often a waiting list. °  °[facility] [address], °[place] ° (336) 763-8833 [email or web address] °  °[facility] [address], [place], [place] (336)723-1848, Ext. 123 Second and Fourth Thursday of each month, opens at 6:30 AM; Clinic ends at 9 AM.  Patients are seen on a first-come first-served basis, and a limited number are seen during each clinic.  ° °[facility] ° [address], [place], [place] (336) 723-7904    Eligibility Requirements °You must have lived in [place], [place], or [place] counties for at least the last three months. °  You cannot be eligible for state or federal sponsored healthcare insurance, including Veterans Administration, Medicaid, or Medicare. °  You generally cannot be eligible for healthcare insurance through your employer.  °  How to apply: °Eligibility screenings are held every Tuesday and Wednesday afternoon from 1:00 pm until 4:00 pm. You do not need an appointment for the interview!  °[facility] [address], [place], [place] 336-631-2330   °[facility]  336-342-8273   °[facility]  336-703-3100   °[facility]  336-570-6415   ° °Behavioral Health Resources in the Community: °Intensive Outpatient Programs °Organization         Address  Phone  Notes  °[facility] [address], [place], [place] 336-878-6098   °[facility] [address], [place], [place] 336-832-9800   °[facility] [address], [place], [place] ° 336-882-2125   °[facility] [address],  °[place], [place] 1-800-853-5163 or 336-641-4981   °Substance Abuse Resources °Organization         Address  Phone  Notes  °[facility]  336-882-2125   °[facility]  336-784-9470   °[facility]  336-285-9073   °[facility]  336-845-3988   °Residential & Outpatient Substance Abuse Program  1-800-659-3381   °Psychological Services °Organization         Address  Phone  Notes  °[facility]  336- 832-9600   °[facility]  336- 378-7881   °[facility] [address], [place] 1-800-853-5163 or 336-641-4981   ° °Mobile Crisis Teams °Organization         Address  Phone  Notes  °[facility], Mobile Crisis Care Unit  1-877-626-1772   °Assertive °[facility] ° [address]  [place], [place] 336-834-9664   °Sharon DeEsch [address] °[place] 336-554-5454   ° °Self-Help/Support Groups °Organization         Address  Phone             Notes  °[facility]  - variety of support groups  336- 373-1402 Call for more information  °Narcotics Anonymous (NA), [facility] [address], °[place]  2 meetings at this location  ° °  Residential Treatment Programs Organization         Address  Phone  Notes  [facility] [address],    [place]  1-(301)649-7447   [facility]  [address] [place] [id number], [place], [place]   [place], [place] [phone] Admissions: 8am-3pm M-F  [facility] [address],    [place], [place] [phone]   [facility] [address] [place], [place], [place]   [facility] [address]  [place], [place]   [facility] Intensive Outpatient [address], [place], [place]   [facility] ([place] [place]  [place], [place] 1-7268624366 or (718)712-9732   [facility] [address] [place], [place] Accepts Medicaid  [facility] [address],  [place] 1-(856)807-3690 Substance Abuse/Addiction Treatment   [facility]         Address  Phone  Notes  [facility]  863-386-6301   Domenic Schwab, PhD [address] [place] [place], [place]   734-569-7919 or (343)641-2802   [facility] [place], [place] (515) 863-6941   [facility] [address] [place], [place] (217)146-4726 Insurance/Medicaid/sponsorship through [facility] and Families [address]                                    [place], [place] (682) [phone] [facility] [address], [place] [phone]    Dr. Adele Schilder  343-067-7312   [facility] Dept. 1) [address], [place] 2) [place] 3)  [address], [place] ([phone]  [phone]   [place] (304) 868-3020 or (902)[phone] (After Hours)      Take the prescription for keflex you received yesterday as directed for your urinary tract infection. Increase your dose of lantus to 25 units SQ twice a day. Call your regular medical doctor today to schedule a follow up appointment within the next 2 days.  Return to the Emergency Department immediately sooner if worsening.

## 2013-08-15 NOTE — ED Provider Notes (Signed)
Pt's CBG trending downward with IVF and insulin. VS remain stable. Udip with UTI, but pt was seen in the ED yesterday for same and rx keflex. Pt states she wants to go home now. Will d/c home with outpt f/u.   Alfonzo Feller, DO 08/15/13 [phone]

## 2013-08-16 LAB — URINE CULTURE: Colony Count: >=100000

## 2013-08-27 ENCOUNTER — Other Ambulatory Visit: Payer: Managed Care, Other (non HMO)

## 2013-08-28 ENCOUNTER — Other Ambulatory Visit: Payer: Managed Care, Other (non HMO)

## 2013-09-10 ENCOUNTER — Ambulatory Visit: Payer: Managed Care, Other (non HMO) | Attending: Internal Medicine | Admitting: Internal Medicine

## 2013-09-10 ENCOUNTER — Encounter: Payer: Self-pay | Admitting: Internal Medicine

## 2013-09-10 VITALS — BP 142/89 | HR 78 | Temp 98.7°F | Resp 16 | Ht 60.0 in | Wt 180.0 lb

## 2013-09-10 DIAGNOSIS — Z794 Long term (current) use of insulin: Secondary | ICD-10-CM | POA: Insufficient documentation

## 2013-09-10 DIAGNOSIS — R011 Cardiac murmur, unspecified: Secondary | ICD-10-CM | POA: Insufficient documentation

## 2013-09-10 DIAGNOSIS — M545 Low back pain, unspecified: Secondary | ICD-10-CM | POA: Insufficient documentation

## 2013-09-10 DIAGNOSIS — E119 Type 2 diabetes mellitus without complications: Secondary | ICD-10-CM

## 2013-09-10 DIAGNOSIS — E1142 Type 2 diabetes mellitus with diabetic polyneuropathy: Secondary | ICD-10-CM | POA: Insufficient documentation

## 2013-09-10 DIAGNOSIS — R079 Chest pain, unspecified: Secondary | ICD-10-CM | POA: Insufficient documentation

## 2013-09-10 DIAGNOSIS — E1149 Type 2 diabetes mellitus with other diabetic neurological complication: Secondary | ICD-10-CM

## 2013-09-10 DIAGNOSIS — Z09 Encounter for follow-up examination after completed treatment for conditions other than malignant neoplasm: Secondary | ICD-10-CM | POA: Insufficient documentation

## 2013-09-10 DIAGNOSIS — E1140 Type 2 diabetes mellitus with diabetic neuropathy, unspecified: Secondary | ICD-10-CM

## 2013-09-10 DIAGNOSIS — I10 Essential (primary) hypertension: Secondary | ICD-10-CM | POA: Insufficient documentation

## 2013-09-10 DIAGNOSIS — M549 Dorsalgia, unspecified: Secondary | ICD-10-CM

## 2013-09-10 LAB — GLUCOSE, POCT (MANUAL RESULT ENTRY): POC Glucose: 86 mg/dl (ref 70–99)

## 2013-09-10 MED ORDER — NAPROXEN 500 MG PO TABS: 500.0000 mg | ORAL_TABLET | Freq: Two times a day (BID) | ORAL | Status: DC

## 2013-09-10 MED ORDER — PREGABALIN 100 MG PO CAPS: 100.0000 mg | ORAL_CAPSULE | Freq: Three times a day (TID) | ORAL | Status: DC

## 2013-09-10 MED ORDER — ACETAMINOPHEN-CODEINE #3 300-30 MG PO TABS: 1.0000 | ORAL_TABLET | ORAL | Status: DC | PRN

## 2013-09-10 NOTE — Progress Notes (Signed)
Patient ID: [name] [name], female   DOB: 23-Apr-1966, 48 y.o.   MRN: [id number]   Adaria Hunsley, is a 48 y.o. female  [id number]  [id number]  DOB - 10-04-1965  Chief Complaint  Patient presents with  . Follow-up        Subjective:   [name] [name] is a 49 y.o. female here today for a follow up visit. Patient has history of diabetes and hypertension. Her diabetes is requiring insulin, he test hemoglobin A1c is 14%. Patient came today complaining of some nonspecific chest pain in the center of her chest, not radiating. Patient has developed diabetic neuropathic pain in her legs for which she takes pregabalin. There is no new symptoms. Patient has been extensively worked up for cardiac origin of chest pain and has been negative including series of troponins and CK-MB. Her last echo was in January 2014 with left ventricular ejection fraction of 55-65%. Patient does not smoke cigarette, she does not drink alcohol. Patient has No headache, No chest pain, No abdominal pain - No Nausea, No new weakness tingling or numbness, No Cough - SOB.  Problem  Diabetes  Heart Murmur, Systolic    ALLERGIES: No Known Allergies  PAST MEDICAL HISTORY: Past Medical History  Diagnosis Date  . Diabetes mellitus   . Hypertension     MEDICATIONS AT HOME: Prior to Admission medications   Medication Sig Start Date End Date Taking? Authorizing Provider  fish oil-omega-3 fatty acids 1000 MG capsule Take 2 capsules (2 g total) by mouth daily. 01/09/13  Yes Robbie Lis, MD  glucose blood test strip Use as instructed 08/13/13  Yes Reyne Dumas, MD  insulin aspart (NOVOLOG) 100 UNIT/ML injection Inject 20 Units into the skin 3 (three) times daily before meals.   Yes Historical Provider, MD  insulin glargine (LANTUS) 100 UNIT/ML injection Inject 0.4 mLs (40 Units total) into the skin 2 (two) times daily. 08/13/13  Yes Reyne Dumas, MD  lisinopril (PRINIVIL,ZESTRIL) 10 MG tablet Take 1 tablet (10 mg total) by mouth  daily. 08/13/13  Yes Reyne Dumas, MD  naproxen (NAPROSYN) 500 MG tablet Take 1 tablet (500 mg total) by mouth 2 (two) times daily with a meal. 09/10/13  Yes Angelica Chessman, MD  pregabalin (LYRICA) 100 MG capsule Take 1 capsule (100 mg total) by mouth 3 (three) times daily after meals. 09/10/13  Yes Angelica Chessman, MD  traMADol (ULTRAM) 50 MG tablet Take 25-50 mg by mouth 2 (two) times daily as needed for moderate pain.   Yes Historical Provider, MD  acetaminophen-codeine (TYLENOL #3) 300-30 MG per tablet Take 1 tablet by mouth every 4 (four) hours as needed. 09/10/13   Angelica Chessman, MD  cephALEXin (KEFLEX) 500 MG capsule Take 1 capsule (500 mg total) by mouth 4 (four) times daily. 08/14/13   Hannah Muthersbaugh, PA-C  gabapentin (NEURONTIN) 100 MG capsule Take 100 mg by mouth 3 (three) times daily.    Historical Provider, MD  metFORMIN (GLUCOPHAGE) 1000 MG tablet Take 1 tablet (1,000 mg total) by mouth 2 (two) times daily with a meal. Take daily for 7 days, and then increase to twice daily. 08/13/13   Reyne Dumas, MD     Objective:   Filed Vitals:   09/10/13 1535 09/10/13 1544  BP: 152/103 142/89  Pulse: 78   Temp: 98.7 F (37.1 C)   TempSrc: Oral   Resp: 16   Height: 5' (1.524 m)   Weight: 180 lb (81.647 kg)   SpO2: 99%  Exam General appearance : Awake, alert, not in any distress. Speech Clear. Not toxic looking HEENT: Atraumatic and Normocephalic, pupils equally reactive to light and accomodation Neck: supple, no JVD. No cervical lymphadenopathy.  Chest:Good air entry bilaterally, no added sounds  CVS: S1 S2 regular, no murmurs.  Abdomen: Bowel sounds present, Non tender and not distended with no gaurding, rigidity or rebound. Extremities: B/L Lower Ext shows no edema, both legs are warm to touch Neurology: Awake alert, and oriented X 3, CN II-XII intact, Non focal Skin:No Rash Wounds:N/A  Data Review Lab Results  Component Value Date   HGBA1C 14.0 08/13/2013   HGBA1C  13.8* 05/09/2013   HGBA1C TEST NOT PERFORMED 03/27/2013     Assessment & Plan   EKG done in the clinic today shows normal sinus rhythm. No specific ST-T wave changes  1. Diabetes  - Glucose (CBG) Hemoglobin A 1C last month these 14%,  Patient is on insulin regimen, she has been counseled extensively on compliance with medications on insulin regimen. I educated patient on blood sugar goals and expectation concerning her blood sugar and hemoglobin A1c reduction to avoid complications.  2. Back pain  - pregabalin (LYRICA) 100 MG capsule; Take 1 capsule (100 mg total) by mouth 3 (three) times daily after meals.  Dispense: 90 capsule; Refill: 3 - naproxen (NAPROSYN) 500 MG tablet; Take 1 tablet (500 mg total) by mouth 2 (two) times daily with a meal.  Dispense: 30 tablet; Refill: 0 - acetaminophen-codeine (TYLENOL #3) 300-30 MG per tablet; Take 1 tablet by mouth every 4 (four) hours as needed.  Dispense: 60 tablet; Refill: 0  3. Diabetic neuropathy  - pregabalin (LYRICA) 100 MG capsule; Take 1 capsule (100 mg total) by mouth 3 (three) times daily after meals.  Dispense: 90 capsule; Refill: 3  4. Heart murmur, systolic  - 2D Echocardiogram with contrast; Future  Patient was counseled extensively about nutrition and exercise  Return in about 3 months (around 12/11/2013), or if symptoms worsen or fail to improve, for Hemoglobin A1C and Follow up, DM.  The patient was given clear instructions to go to ER or return to medical center if symptoms don't improve, worsen or new problems develop. The patient verbalized understanding. The patient was told to call to get lab results if they haven't heard anything in the next week.   This note has been created with Surveyor, quantity. Any transcriptional errors are unintentional.    Angelica Chessman, MD, [place], [place], [place] [facility] [place], [place]     09/10/2013, 5:13 PM

## 2013-09-10 NOTE — Patient Instructions (Signed)
Heart Murmur A heart murmur is an extra sound heard by your health care provider when listening to your heart with a device called a stethoscope. The sound comes from turbulence when blood flows through the heart and may be a "hum" or "whoosh" sound heard when the heart beats. There are two types of heart murmurs:  Innocent murmurs Most people with this type of heart murmur do not have a heart problem. Many children have innocent heart murmurs. Your health care provider may suggest some basic testing to know whether your murmur is an innocent murmur. If an innocent heart murmur is found, there is no need for further tests or treatment and no need to restrict activities or stop playing sports.  Abnormal murmurs These types of murmurs can occur in children and adults. In children, abnormal heart murmurs are typically caused from heart defects that are present at birth (congenital). In adults, abnormal murmurs are usually from heart valve problems caused by disease, infection, or aging. CAUSES  All heart murmurs are a result of an issue with your heart valves. Normally, these valves open to let blood flow through or out of your heart and then shut to keep it from flowing backward. If they do not work properly, you could have:  Regurgitation When blood leaks back through the valve in the wrong direction.  Mitral valve prolapse When the mitral valve of the heart has a loose flap and does not close tightly.  Stenosis When the valve does not open enough and blocks blood flow. SIGNS AND SYMPTOMS  Innocent murmurs do not cause symptoms, and many people with abnormal murmurs may or may not have symptoms. If symptoms do develop, they may include:  Shortness of breath.  Blue coloring of the skin, especially on the fingertips.  Chest pain.  Palpitations, or feeling a fluttering or skipped heartbeat.  Fainting.  Persistent cough.  Getting tired much faster than expected. DIAGNOSIS  A heart murmur  might be heard during a sports physical or during any type of examination. When a murmur is heard, it may suggest a possible problem. When this happens, your health care provider may ask you to see a heart specialist (cardiologist). You may also be asked to have one or more heart tests. In these cases, testing may vary depending on what your health care provider heard. Tests for a heart murmur may include:  Electrocardiogram.  Echocardiogram.  MRI. For children and adults who have an abnormal heart murmur and want to play sports, it is important to complete testing, review test results, and receive recommendations from your health care provider. If heart disease is present, it may not be safe to play. TREATMENT  Innocent murmurs require no treatment or activity restriction. If an abnormal murmur represents a problem with the heart, treatment will depend on the exact nature of the problem. In these cases, medicine or surgery may be needed to treat the problem. HOME CARE INSTRUCTIONS If you want to participate in sports or other types of strenuous physical activity, it is important to discuss this first with your health care provider. If the murmur represents a problem with the heart and you choose to participate in sports, there is a small chance that a serious problem (including sudden death) could result.  SEEK MEDICAL CARE IF:   You feel that your symptoms are slowly worsening.  You develop any new symptoms that cause concern.  You feel that you are having side effects from any medicines prescribed. SEEK IMMEDIATE  MEDICAL CARE IF:   You develop chest pain.  You have shortness of breath.  You notice that your heart beats irregularly often enough to cause you to worry.  You have fainting spells.  Your symptoms suddenly get worse. Document Released: 07/29/2004 Document Revised: 04/11/2013 Document Reviewed: 02/26/2013 [facility] Patient Information 2014 [place].

## 2013-09-10 NOTE — Progress Notes (Signed)
Pt is here following up on her lower back pain and diabetes. Pt reports having chest pain for three days.

## 2013-09-19 ENCOUNTER — Ambulatory Visit: Payer: Managed Care, Other (non HMO) | Attending: Cardiology | Admitting: Cardiology

## 2013-09-19 ENCOUNTER — Other Ambulatory Visit: Payer: Self-pay | Admitting: Emergency Medicine

## 2013-09-19 ENCOUNTER — Encounter: Payer: Self-pay | Admitting: Cardiology

## 2013-09-19 ENCOUNTER — Ambulatory Visit (HOSPITAL_COMMUNITY)
Admission: RE | Admit: 2013-09-19 | Discharge: 2013-09-19 | Disposition: A | Discharge status: Home / Self Care | Payer: Managed Care, Other (non HMO) | Source: Ambulatory Visit | Attending: Internal Medicine | Admitting: Internal Medicine

## 2013-09-19 VITALS — BP 171/103 | HR 76 | Temp 98.6°F | Resp 16 | Ht 60.0 in | Wt 182.0 lb

## 2013-09-19 DIAGNOSIS — I080 Rheumatic disorders of both mitral and aortic valves: Secondary | ICD-10-CM

## 2013-09-19 DIAGNOSIS — R079 Chest pain, unspecified: Secondary | ICD-10-CM

## 2013-09-19 DIAGNOSIS — I10 Essential (primary) hypertension: Secondary | ICD-10-CM | POA: Insufficient documentation

## 2013-09-19 DIAGNOSIS — E1110 Type 2 diabetes mellitus with ketoacidosis without coma: Secondary | ICD-10-CM

## 2013-09-19 DIAGNOSIS — E669 Obesity, unspecified: Secondary | ICD-10-CM | POA: Insufficient documentation

## 2013-09-19 DIAGNOSIS — IMO0002 Reserved for concepts with insufficient information to code with codable children: Secondary | ICD-10-CM

## 2013-09-19 DIAGNOSIS — E1165 Type 2 diabetes mellitus with hyperglycemia: Secondary | ICD-10-CM

## 2013-09-19 DIAGNOSIS — E1310 Other specified diabetes mellitus with ketoacidosis without coma: Secondary | ICD-10-CM

## 2013-09-19 DIAGNOSIS — R011 Cardiac murmur, unspecified: Secondary | ICD-10-CM

## 2013-09-19 DIAGNOSIS — Z79899 Other long term (current) drug therapy: Secondary | ICD-10-CM | POA: Insufficient documentation

## 2013-09-19 DIAGNOSIS — IMO0001 Reserved for inherently not codable concepts without codable children: Secondary | ICD-10-CM

## 2013-09-19 DIAGNOSIS — Z794 Long term (current) use of insulin: Secondary | ICD-10-CM | POA: Insufficient documentation

## 2013-09-19 DIAGNOSIS — I059 Rheumatic mitral valve disease, unspecified: Secondary | ICD-10-CM

## 2013-09-19 DIAGNOSIS — E119 Type 2 diabetes mellitus without complications: Secondary | ICD-10-CM | POA: Insufficient documentation

## 2013-09-19 MED ORDER — LOSARTAN POTASSIUM 50 MG PO TABS: 50.0000 mg | ORAL_TABLET | Freq: Every day | ORAL | Status: DC

## 2013-09-19 NOTE — Assessment & Plan Note (Signed)
I suspect this is musculoskeletal from all her coughing. I've also advised her to wear supportive bras. I've changed her lisinopril to losartan 50 mg each morning. Will need blood pressure followup on new med.

## 2013-09-19 NOTE — Progress Notes (Signed)
HPI [name] [name] is a 48 year old after American female who is referred to the cardiology clinic today for the evaluation of chest discomfort. She's been having this for several months. It generally occurs at rest and is not made worse by activity. His mid lower substernal and radiates under her left breast. It does not go to the arm. It is described as a sudden squeeze it lasts only a few seconds. There is no nausea vomiting or diaphoresis associated with it.  She has had normal EKG on several visits to the emergency room. Her cardiac markers have always been negative. Chest x-ray has been unremarkable. 2-D echocardiogram in 2014 was normal except for minimal mitral regurgitation and atrial septal thickening. She had echocardiogram done again today. Results are pending.  She states that the discomfort is worse when she lies down requiring her to sleep on 2 pillows. She denies any pain with inspiration, cough, no hemoptysis, no fever, chills. She's also had no trauma.  Her cardiac risk factors include diabetes mellitus, obesity, and hypertension. I do not see a lipid profile in the chart.  Remarkably, she has had a problem with coughing her last several months. It is dry nonproductive. Is worse at night. She is on an ACE inhibitor.   Past Medical History  Diagnosis Date  . Diabetes mellitus   . Hypertension     Current Outpatient Prescriptions  Medication Sig Dispense Refill  . acetaminophen-codeine (TYLENOL #3) 300-30 MG per tablet Take 1 tablet by mouth every 4 (four) hours as needed.  60 tablet  0  . fish oil-omega-3 fatty acids 1000 MG capsule Take 2 capsules (2 g total) by mouth daily.  30 capsule  3  . insulin aspart (NOVOLOG) 100 UNIT/ML injection Inject 20 Units into the skin 3 (three) times daily before meals.      . insulin glargine (LANTUS) 100 UNIT/ML injection Inject 0.4 mLs (40 Units total) into the skin 2 (two) times daily.  10 mL  10  . metFORMIN (GLUCOPHAGE) 1000 MG tablet Take 1  tablet (1,000 mg total) by mouth 2 (two) times daily with a meal. Take daily for 7 days, and then increase to twice daily.  60 tablet  2  . naproxen (NAPROSYN) 500 MG tablet Take 1 tablet (500 mg total) by mouth 2 (two) times daily with a meal.  30 tablet  0  . pregabalin (LYRICA) 100 MG capsule Take 1 capsule (100 mg total) by mouth 3 (three) times daily after meals.  90 capsule  3  . cephALEXin (KEFLEX) 500 MG capsule Take 1 capsule (500 mg total) by mouth 4 (four) times daily.  40 capsule  0  . gabapentin (NEURONTIN) 100 MG capsule Take 100 mg by mouth 3 (three) times daily.      Marland Kitchen glucose blood test strip Use as instructed  100 each  12  . losartan (COZAAR) 50 MG tablet Take 1 tablet (50 mg total) by mouth daily.  90 tablet  3  . traMADol (ULTRAM) 50 MG tablet Take 25-50 mg by mouth 2 (two) times daily as needed for moderate pain.       Current Facility-Administered Medications  Medication Dose Route Frequency Provider Last Rate Last Dose  . 0.9 %  sodium chloride infusion   Intravenous Continuous Reyne Dumas, MD 20 mL/hr at 08/13/13 1359      No Known Allergies  Family History  Problem Relation Age of Onset  . Diabetes Father   . Heart attack Father  History   Social History  . Marital Status: Married    Spouse Name: N/A    Number of Children: N/A  . Years of Education: N/A   Occupational History  . Not on file.   Social History Main Topics  . Smoking status: Never Smoker   . Smokeless tobacco: Never Used  . Alcohol Use: No  . Drug Use: No  . Sexual Activity: Yes   Other Topics Concern  . Not on file   Social History Narrative  . No narrative on file    ROS ALL NEGATIVE EXCEPT THOSE NOTED IN HPI  PE  General Appearance: well developed, well nourished in no acute distress, obese HEENT: symmetrical face, PERRLA, good dentition  Neck: no JVD, thyromegaly, or adenopathy, trachea midline Chest: symmetric without deformity Cardiac: PMI non-displaced, RRR,  normal S1, S2, 1 component sound is low-grade left upper sternal border. Heart is described but clearly is not a rub. Lung: clear to ausculation and percussion Vascular: all pulses full without bruits  Abdominal: nondistended, nontender, good bowel sounds, no HSM, no bruits Extremities: no cyanosis, clubbing or edema, no sign of DVT, no varicosities  Skin: normal color, no rashes Neuro: alert and oriented x 3, non-focal Pysch: normal affect  EKG  Not repeated. Previously been normal sinus rhythm with low voltage in the anterior precordium  BMET    Component Value Date/Time   NA 138 08/15/2013 0620   K 4.2 08/15/2013 0620   CL 101 08/15/2013 0620   CO2 22 08/15/2013 0620   GLUCOSE 459* 08/15/2013 0620   BUN 9 08/15/2013 0620   CREATININE 0.49* 08/15/2013 0620   CREATININE 0.73 08/14/2013 1530   CALCIUM 8.4 08/15/2013 0620   GFRNONAA >90 08/15/2013 0620   GFRAA >90 08/15/2013 0620    Lipid Panel     Component Value Date/Time   CHOL 257* 03/27/2013 1030   TRIG 122 03/27/2013 1030   HDL 57 03/27/2013 1030   CHOLHDL 4.5 03/27/2013 1030   VLDL 24 03/27/2013 1030   LDLCALC 176* 03/27/2013 1030    CBC    Component Value Date/Time   WBC 6.2 08/15/2013 0620   RBC 4.76 08/15/2013 0620   HGB 13.1 08/15/2013 0620   HCT 38.3 08/15/2013 0620   PLT 172 08/15/2013 0620   MCV 80.5 08/15/2013 0620   MCH 27.5 08/15/2013 0620   MCHC 34.2 08/15/2013 0620   RDW 13.2 08/15/2013 0620   LYMPHSABS 2.3 08/15/2013 0620   MONOABS 0.5 08/15/2013 0620   EOSABS 0.2 08/15/2013 0620   BASOSABS 0.0 08/15/2013 [id number]

## 2013-09-19 NOTE — Assessment & Plan Note (Signed)
This is very soft and short and left upper sternal border. It does not sound like aortic sclerosis. I do not feel it is from her minimal mitral regurgitation. It also does not sound like they were of but we need to look at the echocardiogram to make sure she has no effusion. There is nothing by history clinically to go along with pericarditis.

## 2013-09-19 NOTE — Assessment & Plan Note (Signed)
I changed her from lisinopril to losartan 50 mg a day. She needs to lose weight and to be more active.

## 2013-09-19 NOTE — Progress Notes (Signed)
Pt is here to f/u with Dr. Verl Blalock for ongoing Left chest wall pain with radiation C/o intermit left chest radiating under breast to back since last visit 09/10/13 Taking medication daily BP elevated 171/103 76 ECHO pending

## 2013-09-24 ENCOUNTER — Telehealth: Payer: Self-pay | Admitting: Internal Medicine

## 2013-09-24 NOTE — Telephone Encounter (Signed)
Pt calling about echocardiogram results, test done on 09/21/13.

## 2013-09-25 ENCOUNTER — Telehealth: Payer: Self-pay

## 2013-09-25 NOTE — Telephone Encounter (Signed)
Pt calling back, says call got disconnected yesterday while speaking to nurse.  Please call back.

## 2013-09-25 NOTE — Telephone Encounter (Signed)
Patient would like results of her echocardiogram Please review and advise

## 2013-10-04 ENCOUNTER — Ambulatory Visit: Payer: Managed Care, Other (non HMO) | Attending: Internal Medicine | Admitting: Internal Medicine

## 2013-10-04 ENCOUNTER — Encounter: Payer: Self-pay | Admitting: Internal Medicine

## 2013-10-04 VITALS — BP 155/103 | HR 78 | Temp 98.7°F | Resp 16

## 2013-10-04 DIAGNOSIS — I10 Essential (primary) hypertension: Secondary | ICD-10-CM

## 2013-10-04 DIAGNOSIS — R739 Hyperglycemia, unspecified: Secondary | ICD-10-CM

## 2013-10-04 LAB — COMPLETE METABOLIC PANEL WITH GFR
ALT: 10 U/L (ref 0–35)
AST: 8 U/L (ref 0–37)
Albumin: 3.4 g/dL — ABNORMAL LOW (ref 3.5–5.2)
Alkaline Phosphatase: 128 U/L — ABNORMAL HIGH (ref 39–117)
BUN: 13 mg/dL (ref 6–23)
CO2: 24 mEq/L (ref 19–32)
Calcium: 7.9 mg/dL — ABNORMAL LOW (ref 8.4–10.5)
Chloride: 102 mEq/L (ref 96–112)
Creat: 0.68 mg/dL (ref 0.50–1.10)
GFR, Est African American: >89 mL/min
GFR, Est Non African American: >89 mL/min
Glucose, Bld: 341 mg/dL — ABNORMAL HIGH (ref 70–99)
Potassium: 3.8 mEq/L (ref 3.5–5.3)
Sodium: 136 mEq/L (ref 135–145)
Total Bilirubin: 0.3 mg/dL (ref 0.2–1.2)
Total Protein: 5.9 g/dL — ABNORMAL LOW (ref 6.0–8.3)

## 2013-10-04 LAB — GLUCOSE, POCT (MANUAL RESULT ENTRY)
POC Glucose: 473 mg/dl — AB (ref 70–99)
POC Glucose: HIGH mg/dl (ref 70–99)

## 2013-10-04 MED ORDER — ACETAMINOPHEN-CODEINE #2 300-15 MG PO TABS: 1.0000 | ORAL_TABLET | Freq: Every evening | ORAL | Status: DC | PRN

## 2013-10-04 MED ORDER — INSULIN ASPART 100 UNIT/ML ~~LOC~~ SOLN
40.0000 [IU] | Freq: Once | SUBCUTANEOUS | Status: AC
  Administered 2013-10-04: 40 [IU] via SUBCUTANEOUS

## 2013-10-04 MED ORDER — LOSARTAN POTASSIUM 100 MG PO TABS: 100.0000 mg | ORAL_TABLET | Freq: Every day | ORAL | Status: DC

## 2013-10-04 MED ORDER — SODIUM CHLORIDE 0.9 % IJ SOLN: 999.0000 mL | Freq: Once | INTRAMUSCULAR | Status: DC

## 2013-10-04 NOTE — Progress Notes (Unsigned)
Subjective:     Patient ID: [name] [name], female   DOB: 1966/04/15, 48 y.o.   MRN: [id number]  HPI   Review of Systems     Objective:   Physical Exam     Assessment:     ***    Plan:     ***     Pt comes in for blood pressure recheck post starting new medication Taking prescribed Losartan 50 mg tab BP at home running in 160's-100's C/o horrible headache

## 2013-10-04 NOTE — Patient Instructions (Signed)
Patient to carefully look at the AVS and follow instructions. Followup in 2-3 weeks for diabetes and blood pressure check Follow up with a CBG measures greater than 300, check CBC 3 times a day

## 2013-10-04 NOTE — Progress Notes (Unsigned)
Patient ID: [name] [name], female   DOB: [birth year], 48 y.o.   MRN: [place]:[id number]   CC:  HPI: 48 year old female history of diabetes, comes in for a blood pressure check. The patient complains of headache which is intractable for the last several days. Her blood pressures closely and 160-170 at home. The patient complains of feeling bad for the last couple of days. She checked her CBC yesterday and it was greater than 250. She did not check it this morning but states that she took her Lantus. And had breakfast after that. Her CBGs unmeasurable today and will be rechecked it twice in the clinic today.    No Known Allergies Past Medical History  Diagnosis Date  . Diabetes mellitus   . Hypertension    Current Outpatient Prescriptions on File Prior to Visit  Medication Sig Dispense Refill  . acetaminophen-codeine (TYLENOL #3) 300-30 MG per tablet Take 1 tablet by mouth every 4 (four) hours as needed.  60 tablet  0  . cephALEXin (KEFLEX) 500 MG capsule Take 1 capsule (500 mg total) by mouth 4 (four) times daily.  40 capsule  0  . fish oil-omega-3 fatty acids 1000 MG capsule Take 2 capsules (2 g total) by mouth daily.  30 capsule  3  . gabapentin (NEURONTIN) 100 MG capsule Take 100 mg by mouth 3 (three) times daily.      Marland Kitchen glucose blood test strip Use as instructed  100 each  12  . insulin aspart (NOVOLOG) 100 UNIT/ML injection Inject 20 Units into the skin 3 (three) times daily before meals.      . insulin glargine (LANTUS) 100 UNIT/ML injection Inject 0.4 mLs (40 Units total) into the skin 2 (two) times daily.  10 mL  10  . metFORMIN (GLUCOPHAGE) 1000 MG tablet Take 1 tablet (1,000 mg total) by mouth 2 (two) times daily with a meal. Take daily for 7 days, and then increase to twice daily.  60 tablet  2  . pregabalin (LYRICA) 100 MG capsule Take 1 capsule (100 mg total) by mouth 3 (three) times daily after meals.  90 capsule  3  . traMADol (ULTRAM) 50 MG tablet Take 25-50 mg by mouth 2 (two) times  daily as needed for moderate pain.       Current Facility-Administered Medications on File Prior to Visit  Medication Dose Route Frequency Provider Last Rate Last Dose  . 0.9 %  sodium chloride infusion   Intravenous Continuous Reyne Dumas, MD 20 mL/hr at 08/13/13 1359     Family History  Problem Relation Age of Onset  . Diabetes Father   . Heart attack Father    History   Social History  . Marital Status: Married    Spouse Name: N/A    Number of Children: N/A  . Years of Education: N/A   Occupational History  . Not on file.   Social History Main Topics  . Smoking status: Never Smoker   . Smokeless tobacco: Never Used  . Alcohol Use: No  . Drug Use: No  . Sexual Activity: Yes   Other Topics Concern  . Not on file   Social History Narrative  . No narrative on file    Review of Systems  Constitutional: Negative for fever, chills, diaphoresis, activity change, appetite change and fatigue.  HENT: Negative for ear pain, nosebleeds, congestion, facial swelling, rhinorrhea, neck pain, neck stiffness and ear discharge.   Eyes: Negative for pain, discharge, redness, itching and visual disturbance.  Respiratory: Negative for cough, choking, chest tightness, shortness of breath, wheezing and stridor.   Cardiovascular: Negative for chest pain, palpitations and leg swelling.  Gastrointestinal: Negative for abdominal distention.  Genitourinary: Negative for dysuria, urgency, frequency, hematuria, flank pain, decreased urine volume, difficulty urinating and dyspareunia.  Musculoskeletal: Negative for back pain, joint swelling, arthralgias and gait problem.  Neurological: Negative for dizziness, tremors, seizures, syncope, facial asymmetry, speech difficulty, weakness, light-headedness, numbness and headaches.  Hematological: Negative for adenopathy. Does not bruise/bleed easily.  Psychiatric/Behavioral: Negative for hallucinations, behavioral problems, confusion, dysphoric mood,  decreased concentration and agitation.    Objective:   Filed Vitals:   10/04/13 1250  BP: 168/102  Pulse: 74  Temp: 98.7 F (37.1 C)  Resp: 16    Physical Exam  Constitutional: Appears well-developed and well-nourished. No distress.  HENT: Normocephalic. External right and left ear normal. Oropharynx is clear and moist.  Eyes: Conjunctivae and EOM are normal. PERRLA, no scleral icterus.  Neck: Normal ROM. Neck supple. No JVD. No tracheal deviation. No thyromegaly.  CVS: RRR, S1/S2 +, no murmurs, no gallops, no carotid bruit.  Pulmonary: Effort and breath sounds normal, no stridor, rhonchi, wheezes, rales.  Abdominal: Soft. BS +,  no distension, tenderness, rebound or guarding.  Musculoskeletal: Normal range of motion. No edema and no tenderness.  Lymphadenopathy: No lymphadenopathy noted, cervical, inguinal. Neuro: Alert. Normal reflexes, muscle tone coordination. No cranial nerve deficit. Skin: Skin is warm and dry. No rash noted. Not diaphoretic. No erythema. No pallor.  Psychiatric: Normal mood and affect. Behavior, judgment, thought content normal.   Lab Results  Component Value Date   WBC 6.2 08/15/2013   HGB 13.1 08/15/2013   HCT 38.3 08/15/2013   MCV 80.5 08/15/2013   PLT 172 08/15/2013   Lab Results  Component Value Date   CREATININE 0.49* 08/15/2013   BUN 9 08/15/2013   NA 138 08/15/2013   K 4.2 08/15/2013   CL 101 08/15/2013   CO2 22 08/15/2013    Lab Results  Component Value Date   HGBA1C 14.0 08/13/2013   Lipid Panel     Component Value Date/Time   CHOL 257* 03/27/2013 1030   TRIG 122 03/27/2013 1030   HDL 57 03/27/2013 1030   CHOLHDL 4.5 03/27/2013 1030   VLDL 24 03/27/2013 1030   LDLCALC 176* 03/27/2013 1030       Assessment and plan:   Patient Active Problem List   Diagnosis Date Noted  . Chest pain at rest 09/19/2013  . Essential hypertension 09/19/2013  . Diabetes 09/10/2013  . Heart murmur, systolic [id number]  . Back pain 06/11/2013  . Acute  upper respiratory infections of unspecified site 06/11/2013  . Lumbosacral spondylosis without myelopathy 02/20/2013  . Diabetic neuropathy 01/09/2013  . DM (diabetes mellitus), type 2, uncontrolled 10/02/2012  . MITRAL REGURGITATION, mild 10/31/2009  . PLEURAL EFFUSION, RIGHT after pyelonephritis in April 2011 (no intervention) 10/20/2009       Hyperosmolar hyperglycemic state vs DKA CBG unmeasurable in the clinic, possibly greater than 600 This has happened a couple of times before Patient endorses compliance but not sure if she is actually taking both NovoLog and Lantus in the morning She will be given 20 units of NovoLog today and then has been referred to the ED for a stat CMP/anion gap  to rule out DKA Possibly also need IV hydration   Uncontrolled hypertension Recently was switched to losartan 50 mg Increases this to 100 mg Patient to followup in 2-3 weeks for  blood pressure check     The patient was given clear instructions to go to ER or return to medical center if symptoms don't improve, worsen or new problems develop. The patient verbalized understanding. The patient was told to call to get any lab results if not heard anything in the next week.

## 2013-10-05 ENCOUNTER — Encounter (HOSPITAL_COMMUNITY): Payer: Self-pay | Admitting: Emergency Medicine

## 2013-10-05 ENCOUNTER — Emergency Department (HOSPITAL_COMMUNITY)
Admission: EM | Admit: 2013-10-05 | Discharge: 2013-10-05 | Disposition: A | Discharge status: Home / Self Care | Payer: Managed Care, Other (non HMO) | Attending: Emergency Medicine | Admitting: Emergency Medicine

## 2013-10-05 DIAGNOSIS — R51 Headache: Secondary | ICD-10-CM | POA: Insufficient documentation

## 2013-10-05 DIAGNOSIS — I10 Essential (primary) hypertension: Secondary | ICD-10-CM | POA: Insufficient documentation

## 2013-10-05 DIAGNOSIS — R519 Headache, unspecified: Secondary | ICD-10-CM

## 2013-10-05 DIAGNOSIS — R509 Fever, unspecified: Secondary | ICD-10-CM | POA: Insufficient documentation

## 2013-10-05 DIAGNOSIS — Z79899 Other long term (current) drug therapy: Secondary | ICD-10-CM | POA: Insufficient documentation

## 2013-10-05 DIAGNOSIS — Z794 Long term (current) use of insulin: Secondary | ICD-10-CM | POA: Insufficient documentation

## 2013-10-05 DIAGNOSIS — R110 Nausea: Secondary | ICD-10-CM | POA: Insufficient documentation

## 2013-10-05 DIAGNOSIS — H53149 Visual discomfort, unspecified: Secondary | ICD-10-CM | POA: Insufficient documentation

## 2013-10-05 MED ORDER — ACETAMINOPHEN 500 MG PO TABS
1000.0000 mg | ORAL_TABLET | Freq: Once | ORAL | Status: AC
  Administered 2013-10-05: 1000 mg via ORAL
  Filled 2013-10-05: qty 2

## 2013-10-05 NOTE — ED Notes (Signed)
Pt reports was told to double her Lozartan, beginning yesterday and has had a sever headache since then.

## 2013-10-05 NOTE — Discharge Instructions (Signed)
Continue your current Blood pressure medications as directed. Keep daily logs of blood pressures (morning and night) as well as daily blood sugar measurements. Follow up with your doctor in 1 week for reevaluation. Return to Emergency department if you develop any worsening headache, vomiting, fever, dizziness, chest pain, or shortness of breath.    Headaches, Frequently Asked Questions MIGRAINE HEADACHES Q: What is migraine? What causes it? How can I treat it? A: Generally, migraine headaches begin as a dull ache. Then they develop into a constant, throbbing, and pulsating pain. You may experience pain at the temples. You may experience pain at the front or back of one or both sides of the head. The pain is usually accompanied by a combination of:  Nausea.  Vomiting.  Sensitivity to light and noise. Some people (about 15%) experience an aura (see below) before an attack. The cause of migraine is believed to be chemical reactions in the brain. Treatment for migraine may include over-the-counter or prescription medications. It may also include self-help techniques. These include relaxation training and biofeedback.  Q: What is an aura? A: About 15% of people with migraine get an "aura". This is a sign of neurological symptoms that occur before a migraine headache. You may see wavy or jagged lines, dots, or flashing lights. You might experience tunnel vision or blind spots in one or both eyes. The aura can include visual or auditory hallucinations (something imagined). It may include disruptions in smell (such as strange odors), taste or touch. Other symptoms include:  Numbness.  A "pins and needles" sensation.  Difficulty in recalling or speaking the correct word. These neurological events may last as long as 60 minutes. These symptoms will fade as the headache begins. Q: What is a trigger? A: Certain physical or environmental factors can lead to or "trigger" a migraine. These  include:  Foods.  Hormonal changes.  Weather.  Stress. It is important to remember that triggers are different for everyone. To help prevent migraine attacks, you need to figure out which triggers affect you. Keep a headache diary. This is a good way to track triggers. The diary will help you talk to your healthcare professional about your condition. Q: Does weather affect migraines? A: Bright sunshine, hot, humid conditions, and drastic changes in barometric pressure may lead to, or "trigger," a migraine attack in some people. But studies have shown that weather does not act as a trigger for everyone with migraines. Q: What is the link between migraine and hormones? A: Hormones start and regulate many of your body's functions. Hormones keep your body in balance within a constantly changing environment. The levels of hormones in your body are unbalanced at times. Examples are during menstruation, pregnancy, or menopause. That can lead to a migraine attack. In fact, about three quarters of all women with migraine report that their attacks are related to the menstrual cycle.  Q: Is there an increased risk of stroke for migraine sufferers? A: The likelihood of a migraine attack causing a stroke is very remote. That is not to say that migraine sufferers cannot have a stroke associated with their migraines. In persons under age 70, the most common associated factor for stroke is migraine headache. But over the course of a person's normal life span, the occurrence of migraine headache may actually be associated with a reduced risk of dying from cerebrovascular disease due to stroke.  Q: What are acute medications for migraine? A: Acute medications are used to treat the pain of  the headache after it has started. Examples over-the-counter medications, NSAIDs, ergots, and triptans.  Q: What are the triptans? A: Triptans are the newest class of abortive medications. They are specifically targeted to treat  migraine. Triptans are vasoconstrictors. They moderate some chemical reactions in the brain. The triptans work on receptors in your brain. Triptans help to restore the balance of a neurotransmitter called serotonin. Fluctuations in levels of serotonin are thought to be a main cause of migraine.  Q: Are over-the-counter medications for migraine effective? A: Over-the-counter, or "OTC," medications may be effective in relieving mild to moderate pain and associated symptoms of migraine. But you should see your caregiver before beginning any treatment regimen for migraine.  Q: What are preventive medications for migraine? A: Preventive medications for migraine are sometimes referred to as "prophylactic" treatments. They are used to reduce the frequency, severity, and length of migraine attacks. Examples of preventive medications include antiepileptic medications, antidepressants, beta-blockers, calcium channel blockers, and NSAIDs (nonsteroidal anti-inflammatory drugs). Q: Why are anticonvulsants used to treat migraine? A: During the past few years, there has been an increased interest in antiepileptic drugs for the prevention of migraine. They are sometimes referred to as "anticonvulsants". Both epilepsy and migraine may be caused by similar reactions in the brain.  Q: Why are antidepressants used to treat migraine? A: Antidepressants are typically used to treat people with depression. They may reduce migraine frequency by regulating chemical levels, such as serotonin, in the brain.  Q: What alternative therapies are used to treat migraine? A: The term "alternative therapies" is often used to describe treatments considered outside the scope of conventional Western medicine. Examples of alternative therapy include acupuncture, acupressure, and yoga. Another common alternative treatment is herbal therapy. Some herbs are believed to relieve headache pain. Always discuss alternative therapies with your caregiver  before proceeding. Some herbal products contain arsenic and other toxins. TENSION HEADACHES Q: What is a tension-type headache? What causes it? How can I treat it? A: Tension-type headaches occur randomly. They are often the result of temporary stress, anxiety, fatigue, or anger. Symptoms include soreness in your temples, a tightening band-like sensation around your head (a "vice-like" ache). Symptoms can also include a pulling feeling, pressure sensations, and contracting head and neck muscles. The headache begins in your forehead, temples, or the back of your head and neck. Treatment for tension-type headache may include over-the-counter or prescription medications. Treatment may also include self-help techniques such as relaxation training and biofeedback. CLUSTER HEADACHES Q: What is a cluster headache? What causes it? How can I treat it? A: Cluster headache gets its name because the attacks come in groups. The pain arrives with little, if any, warning. It is usually on one side of the head. A tearing or bloodshot eye and a runny nose on the same side of the headache may also accompany the pain. Cluster headaches are believed to be caused by chemical reactions in the brain. They have been described as the most severe and intense of any headache type. Treatment for cluster headache includes prescription medication and oxygen. SINUS HEADACHES Q: What is a sinus headache? What causes it? How can I treat it? A: When a cavity in the bones of the face and skull (a sinus) becomes inflamed, the inflammation will cause localized pain. This condition is usually the result of an allergic reaction, a tumor, or an infection. If your headache is caused by a sinus blockage, such as an infection, you will probably have a fever. An x-ray  will confirm a sinus blockage. Your caregiver's treatment might include antibiotics for the infection, as well as antihistamines or decongestants.  REBOUND HEADACHES Q: What is a  rebound headache? What causes it? How can I treat it? A: A pattern of taking acute headache medications too often can lead to a condition known as "rebound headache." A pattern of taking too much headache medication includes taking it more than 2 days per week or in excessive amounts. That means more than the label or a caregiver advises. With rebound headaches, your medications not only stop relieving pain, they actually begin to cause headaches. Doctors treat rebound headache by tapering the medication that is being overused. Sometimes your caregiver will gradually substitute a different type of treatment or medication. Stopping may be a challenge. Regularly overusing a medication increases the potential for serious side effects. Consult a caregiver if you regularly use headache medications more than 2 days per week or more than the label advises. ADDITIONAL QUESTIONS AND ANSWERS Q: What is biofeedback? A: Biofeedback is a self-help treatment. Biofeedback uses special equipment to monitor your body's involuntary physical responses. Biofeedback monitors:  Breathing.  Pulse.  Heart rate.  Temperature.  Muscle tension.  Brain activity. Biofeedback helps you refine and perfect your relaxation exercises. You learn to control the physical responses that are related to stress. Once the technique has been mastered, you do not need the equipment any more. Q: Are headaches hereditary? A: Four out of five (80%) of people that suffer report a family history of migraine. Scientists are not sure if this is genetic or a family predisposition. Despite the uncertainty, a child has a 50% chance of having migraine if one parent suffers. The child has a 75% chance if both parents suffer.  Q: Can children get headaches? A: By the time they reach high school, most young people have experienced some type of headache. Many safe and effective approaches or medications can prevent a headache from occurring or stop it  after it has begun.  Q: What type of doctor should I see to diagnose and treat my headache? A: Start with your primary caregiver. Discuss his or her experience and approach to headaches. Discuss methods of classification, diagnosis, and treatment. Your caregiver may decide to recommend you to a headache specialist, depending upon your symptoms or other physical conditions. Having diabetes, allergies, etc., may require a more comprehensive and inclusive approach to your headache. The National [employer] Foundation will provide, upon request, a list of [facility] physician members in your state. Document Released: 09/11/2003 Document Revised: 09/13/2011 Document Reviewed: 02/19/2008 [facility] Patient Information 2014 [place].

## 2013-10-05 NOTE — ED Notes (Signed)
Pt in c/o elevated BP over the last few days, states that she has a meter that she checks it with at home and it has been running high, went to PCP yesterday for this and they increased her BP medication, states they have been working on her medication to regulate her BP over the last few months, c/o headache at this time

## 2013-10-05 NOTE — ED Provider Notes (Signed)
CSN: [id number]     Arrival date & time 10/05/13  1116 History   First MD Initiated Contact with Patient 10/05/13 1133     Chief Complaint  Patient presents with  . Hypertension     (Consider location/radiation/quality/duration/timing/severity/associated sxs/prior Treatment) Patient is a 48 y.o. female presenting with hypertension.  Hypertension Associated symptoms include a fever and nausea. Pertinent negatives include no abdominal pain, chest pain, coughing or weakness.   48 yo female with hx of HTN and DM, presents with a HA this morning that started around 1030am. Patient states he BP was up and so she decided to come get checked out. Patient seen by her PCP yesterday for BP check and blood glucose evaluation. Patient noted to have elevated BP at visit and her dose of Losartan was doubled from 50-100 mg. States she just started new dose yesterday at 2pm. Patient noted to have elevated BG at office that was unmeasurable. Patient states she measured her BG this morning and noted to be 228 prior to taking her insulin, she states this is normal for her. Patient admits to Nausea, subjective fever, and dull 8/10 HA that is bilateral concentrated behind the eyes and extends around the head. Patient states HA worse with bright lights. Denies visual changes.  Past Medical History  Diagnosis Date  . Diabetes mellitus   . Hypertension    Past Surgical History  Procedure Laterality Date  . Abdominal hysterectomy    . Tubal ligation    . Tonsillectomy    . Cesarean section    . Ganglion cyst excision    . Cholecystectomy    . Eye surgery Left retinal   Family History  Problem Relation Age of Onset  . Diabetes Father   . Heart attack Father    History  Substance Use Topics  . Smoking status: Never Smoker   . Smokeless tobacco: Never Used  . Alcohol Use: No   OB History   Grav Para Term Preterm Abortions TAB SAB Ect Mult Living                 Review of Systems  Constitutional:  Positive for fever.  Eyes: Positive for photophobia. Negative for pain, discharge, redness and visual disturbance.  Respiratory: Negative for cough, shortness of breath and wheezing.   Cardiovascular: Negative for chest pain.  Gastrointestinal: Positive for nausea. Negative for abdominal pain.  Neurological: Negative for dizziness, syncope and weakness.  All other systems reviewed and are negative.      Allergies  Review of patient's allergies indicates no known allergies.  Home Medications   Current Outpatient Rx  Name  Route  Sig  Dispense  Refill  . acetaminophen-codeine (TYLENOL #2) 300-15 MG per tablet   Oral   Take 1 tablet by mouth at bedtime as needed for moderate pain.   30 tablet   0   . fish oil-omega-3 fatty acids 1000 MG capsule   Oral   Take 2 capsules (2 g total) by mouth daily.   30 capsule   3   . insulin aspart (NOVOLOG) 100 UNIT/ML injection   Subcutaneous   Inject 20 Units into the skin 3 (three) times daily before meals.         . insulin glargine (LANTUS) 100 UNIT/ML injection   Subcutaneous   Inject 0.4 mLs (40 Units total) into the skin 2 (two) times daily.   10 mL   10   . losartan (COZAAR) 100 MG tablet   Oral  Take 1 tablet (100 mg total) by mouth daily.   90 tablet   3   . pregabalin (LYRICA) 100 MG capsule   Oral   Take 1 capsule (100 mg total) by mouth 3 (three) times daily after meals.   90 capsule   3    BP 158/88  Pulse 67  Temp(Src) 98.2 F (36.8 C) (Oral)  Resp 18  Wt 180 lb (81.647 kg)  SpO2 99% Physical Exam  Nursing note and vitals reviewed. Constitutional: She is oriented to person, place, and time. She appears well-developed and well-nourished. No distress.  HENT:  Head: Normocephalic and atraumatic.  Right Ear: Tympanic membrane and ear canal normal.  Left Ear: Tympanic membrane and ear canal normal.  Nose: Nose normal. Right sinus exhibits no maxillary sinus tenderness and no frontal sinus tenderness.  Left sinus exhibits no maxillary sinus tenderness and no frontal sinus tenderness.  Mouth/Throat: Uvula is midline, oropharynx is clear and moist and mucous membranes are normal. No oropharyngeal exudate, posterior oropharyngeal edema or posterior oropharyngeal erythema.  Eyes: Conjunctivae and EOM are normal. Pupils are equal, round, and reactive to light. Right eye exhibits no discharge. Left eye exhibits no discharge. No scleral icterus.  Neck: Trachea normal and phonation normal. Neck supple. No JVD present. Carotid bruit is not present. No rigidity. No tracheal deviation, no edema and no erythema present.  Cardiovascular: Normal rate and regular rhythm.  Exam reveals no gallop and no friction rub.   No murmur heard. Pulmonary/Chest: Effort normal and breath sounds normal. No stridor. No respiratory distress. She has no wheezes. She has no rhonchi. She has no rales.  Musculoskeletal: Normal range of motion. She exhibits no edema.  Lymphadenopathy:    She has no cervical adenopathy.  Neurological: She is alert and oriented to person, place, and time. She has normal strength. No cranial nerve deficit or sensory deficit.  CN II-XII grossly intact. Cerebellar function appears intact with finger to nose.   Skin: Skin is warm and dry. She is not diaphoretic.  Psychiatric: She has a normal mood and affect. Her behavior is normal.    ED Course  Procedures (including critical care time) Labs Review Labs Reviewed - No data to display Imaging Review No results found.   EKG Interpretation None      MDM   Final diagnoses:  HTN (hypertension)  HA (headache)   Patient afebrile in NAD.  BP elevated at 168/102 on admission, improved throughout stay w/o tx.  Patient had labs yesterday showing no evidence of end organ damage.  Patient Given tylenol in ED for her HA. Discussed use of BP medications with patient and need for daily measurements morning and evening. Patient advised to keep log of  BPs and Blood sugars, then follow up with her PCP within 1 week for further medication adjustments. Patient agrees with this plan. Return precautions given for worsening HA, dizziness, CP, SOB, vomiting, or fever.   Meds given in ED:  Medications  acetaminophen (TYLENOL) tablet 1,000 mg (1,000 mg Oral Given 10/05/13 1225)    Discharge Medication List as of 10/05/2013 12:19 PM        Sherrie George, PA-C 10/05/13 1310

## 2013-10-05 NOTE — ED Notes (Signed)
Pt comfortable with discharge and follow up instructions. No prescriptions given.

## 2013-10-06 NOTE — ED Provider Notes (Signed)
Medical screening examination/treatment/procedure(s) were conducted as a shared visit with non-physician practitioner(s) and myself.  I personally evaluated the patient during the encounter.   EKG Interpretation None      Pt with hypertension, meds being adjusted by pcp. headaches but otherwise asymptomatic. Labs were done yesterday at pcp office. No further ED workup needed.   Kartier Bennison B. Karle Starch, MD 10/06/13 [phone]

## 2013-10-08 ENCOUNTER — Telehealth: Payer: Self-pay | Admitting: Emergency Medicine

## 2013-10-08 NOTE — Telephone Encounter (Signed)
Left message for pt to call when received

## 2013-10-08 NOTE — Telephone Encounter (Signed)
Pt called in requesting Echo results and blood work. Echo normal Pt reports elevated BP- 165/112 at home with posterior headache Denies blurred vision,dizziness and n/v Pt instructed to come to clinic tomorrow for recheck

## 2013-10-08 NOTE — Telephone Encounter (Signed)
Message copied by Ricci Barker on Mon Oct 08, 2013  3:28 PM ------      Message from: Allyson Sabal MD, [facility]      Created: Fri Oct 05, 2013  2:34 PM       Notify patient that CMp. panel showed normal anion gap which means that the patient was not in DKA. She needs to be compliant with the medication list on her aVs ------

## 2013-10-11 ENCOUNTER — Encounter (HOSPITAL_COMMUNITY): Payer: Self-pay | Admitting: Emergency Medicine

## 2013-10-11 ENCOUNTER — Emergency Department (HOSPITAL_COMMUNITY): Payer: Managed Care, Other (non HMO)

## 2013-10-11 ENCOUNTER — Emergency Department (HOSPITAL_COMMUNITY)
Admission: EM | Admit: 2013-10-11 | Discharge: 2013-10-11 | Discharge status: Against Medical Advice | Payer: Managed Care, Other (non HMO) | Attending: Emergency Medicine | Admitting: Emergency Medicine

## 2013-10-11 DIAGNOSIS — Z792 Long term (current) use of antibiotics: Secondary | ICD-10-CM | POA: Insufficient documentation

## 2013-10-11 DIAGNOSIS — R0789 Other chest pain: Secondary | ICD-10-CM | POA: Insufficient documentation

## 2013-10-11 DIAGNOSIS — I10 Essential (primary) hypertension: Secondary | ICD-10-CM | POA: Insufficient documentation

## 2013-10-11 DIAGNOSIS — R739 Hyperglycemia, unspecified: Secondary | ICD-10-CM

## 2013-10-11 DIAGNOSIS — Z794 Long term (current) use of insulin: Secondary | ICD-10-CM | POA: Insufficient documentation

## 2013-10-11 DIAGNOSIS — R51 Headache: Secondary | ICD-10-CM | POA: Insufficient documentation

## 2013-10-11 DIAGNOSIS — Z79899 Other long term (current) drug therapy: Secondary | ICD-10-CM | POA: Insufficient documentation

## 2013-10-11 DIAGNOSIS — N390 Urinary tract infection, site not specified: Secondary | ICD-10-CM

## 2013-10-11 DIAGNOSIS — R519 Headache, unspecified: Secondary | ICD-10-CM

## 2013-10-11 DIAGNOSIS — R209 Unspecified disturbances of skin sensation: Secondary | ICD-10-CM | POA: Insufficient documentation

## 2013-10-11 DIAGNOSIS — E119 Type 2 diabetes mellitus without complications: Secondary | ICD-10-CM | POA: Insufficient documentation

## 2013-10-11 LAB — COMPREHENSIVE METABOLIC PANEL
ALT: 14 U/L (ref 0–35)
AST: 16 U/L (ref 0–37)
Albumin: 3.4 g/dL — ABNORMAL LOW (ref 3.5–5.2)
Alkaline Phosphatase: 147 U/L — ABNORMAL HIGH (ref 39–117)
BUN: 14 mg/dL (ref 6–23)
CO2: 25 mEq/L (ref 19–32)
Calcium: 9.1 mg/dL (ref 8.4–10.5)
Chloride: 96 mEq/L (ref 96–112)
Creatinine, Ser: 0.59 mg/dL (ref 0.50–1.10)
GFR calc Af Amer: >90 mL/min (ref >90)
GFR calc non Af Amer: >90 mL/min (ref >90)
Glucose, Bld: 439 mg/dL — ABNORMAL HIGH (ref 70–99)
Potassium: 4.4 mEq/L (ref 3.7–5.3)
Sodium: 137 mEq/L (ref 137–147)
Total Bilirubin: 0.2 mg/dL — ABNORMAL LOW (ref 0.3–1.2)
Total Protein: 7.4 g/dL (ref 6.0–8.3)

## 2013-10-11 LAB — URINALYSIS, ROUTINE W REFLEX MICROSCOPIC
Bilirubin Urine: NEGATIVE
Glucose, UA: >1000 mg/dL — AB
Ketones, ur: NEGATIVE mg/dL
Nitrite: NEGATIVE
Protein, ur: 30 mg/dL — AB
Specific Gravity, Urine: 1.029 (ref 1.005–1.030)
Urobilinogen, UA: 0.2 mg/dL (ref 0.0–1.0)
pH: 5 (ref 5.0–8.0)

## 2013-10-11 LAB — BASIC METABOLIC PANEL
BUN: 12 mg/dL (ref 6–23)
CO2: 23 mEq/L (ref 19–32)
Calcium: 8.3 mg/dL — ABNORMAL LOW (ref 8.4–10.5)
Chloride: 101 mEq/L (ref 96–112)
Creatinine, Ser: 0.51 mg/dL (ref 0.50–1.10)
GFR calc Af Amer: >90 mL/min (ref >90)
GFR calc non Af Amer: >90 mL/min (ref >90)
Glucose, Bld: 318 mg/dL — ABNORMAL HIGH (ref 70–99)
Potassium: 4 mEq/L (ref 3.7–5.3)
Sodium: 140 mEq/L (ref 137–147)

## 2013-10-11 LAB — CBC
HCT: 40.5 % (ref 36.0–46.0)
Hemoglobin: 13.7 g/dL (ref 12.0–15.0)
MCH: 27.3 pg (ref 26.0–34.0)
MCHC: 33.8 g/dL (ref 30.0–36.0)
MCV: 80.8 fL (ref 78.0–100.0)
Platelets: 185 10*3/uL (ref 150–400)
RBC: 5.01 MIL/uL (ref 3.87–5.11)
RDW: 13.1 % (ref 11.5–15.5)
WBC: 5.6 10*3/uL (ref 4.0–10.5)

## 2013-10-11 LAB — DIFFERENTIAL
Basophils Absolute: 0 10*3/uL (ref 0.0–0.1)
Basophils Relative: 1 % (ref 0–1)
Eosinophils Absolute: 0.2 10*3/uL (ref 0.0–0.7)
Eosinophils Relative: 3 % (ref 0–5)
Lymphocytes Relative: 39 % (ref 12–46)
Lymphs Abs: 2.2 10*3/uL (ref 0.7–4.0)
Monocytes Absolute: 0.4 10*3/uL (ref 0.1–1.0)
Monocytes Relative: 8 % (ref 3–12)
Neutro Abs: 2.8 10*3/uL (ref 1.7–7.7)
Neutrophils Relative %: 49 % (ref 43–77)

## 2013-10-11 LAB — TROPONIN I: Troponin I: <0.3 ng/mL (ref <0.30)

## 2013-10-11 LAB — URINE MICROSCOPIC-ADD ON

## 2013-10-11 LAB — CBG MONITORING, ED: Glucose-Capillary: 288 mg/dL — ABNORMAL HIGH (ref 70–99)

## 2013-10-11 MED ORDER — INSULIN ASPART 100 UNIT/ML ~~LOC~~ SOLN
5.0000 [IU] | Freq: Once | SUBCUTANEOUS | Status: AC
  Administered 2013-10-11: 5 [IU] via INTRAVENOUS
  Filled 2013-10-11: qty 1

## 2013-10-11 MED ORDER — CEPHALEXIN 500 MG PO CAPS: 500.0000 mg | ORAL_CAPSULE | Freq: Four times a day (QID) | ORAL | Status: DC

## 2013-10-11 MED ORDER — SODIUM CHLORIDE 0.9 % IV BOLUS (SEPSIS)
1000.0000 mL | Freq: Once | INTRAVENOUS | Status: AC
  Administered 2013-10-11: 1000 mL via INTRAVENOUS

## 2013-10-11 MED ORDER — ACETAMINOPHEN 325 MG PO TABS
650.0000 mg | ORAL_TABLET | Freq: Once | ORAL | Status: AC
  Administered 2013-10-11: 650 mg via ORAL
  Filled 2013-10-11: qty 2

## 2013-10-11 NOTE — ED Notes (Signed)
Patient transported to CT 

## 2013-10-11 NOTE — ED Provider Notes (Signed)
CSN: [id number]     Arrival date & time 10/11/13  1411 History   First MD Initiated Contact with Patient 10/11/13 1514     Chief Complaint  Patient presents with  . Headache  . Hypertension     (Consider location/radiation/quality/duration/timing/severity/associated sxs/prior Treatment) HPI Comments: Patient presents with elevated blood pressure and headaches. Her losartan was increased to 100 mg last week and today she was feeling unwell she checked her blood pressure is greater than 180/106. She woke up with a headache around 11:30 AM this morning. It is steadily improved. She's had some tightness in her chest lasting just a few seconds at a time it has now resolved. It is intermittent. She also had some paresthesias in her left hand which has since resolved. No weakness. No difficulty speaking or swallowing. She states compliance with her blood pressure medications. She denies any nausea, vomiting or fever. She is unable to say how the headache started. It was there when she woke up. She did not take her lantus this morning.  The history is provided by the patient.    Past Medical History  Diagnosis Date  . Diabetes mellitus   . Hypertension    Past Surgical History  Procedure Laterality Date  . Abdominal hysterectomy    . Tubal ligation    . Tonsillectomy    . Cesarean section    . Ganglion cyst excision    . Cholecystectomy    . Eye surgery Left retinal   Family History  Problem Relation Age of Onset  . Diabetes Father   . Heart attack Father    History  Substance Use Topics  . Smoking status: Never Smoker   . Smokeless tobacco: Never Used  . Alcohol Use: No   OB History   Grav Para Term Preterm Abortions TAB SAB Ect Mult Living                 Review of Systems  Constitutional: Negative for fever, activity change and appetite change.  HENT: Negative for congestion and rhinorrhea.   Respiratory: Positive for chest tightness. Negative for cough and shortness of  breath.   Cardiovascular: Negative for chest pain.  Gastrointestinal: Negative for nausea, vomiting and abdominal pain.  Genitourinary: Negative for dysuria, vaginal bleeding and vaginal discharge.  Musculoskeletal: Negative for arthralgias and back pain.  Skin: Negative for rash.  Neurological: Positive for numbness and headaches. Negative for dizziness, weakness and light-headedness.  A complete 10 system review of systems was obtained and all systems are negative except as noted in the HPI and PMH.      Allergies  Review of patient's allergies indicates no known allergies.  Home Medications   Current Outpatient Rx  Name  Route  Sig  Dispense  Refill  . acetaminophen-codeine (TYLENOL #2) 300-15 MG per tablet   Oral   Take 1 tablet by mouth at bedtime as needed for moderate pain.   30 tablet   0   . fish oil-omega-3 fatty acids 1000 MG capsule   Oral   Take 2 capsules (2 g total) by mouth daily.   30 capsule   3   . insulin aspart (NOVOLOG) 100 UNIT/ML injection   Subcutaneous   Inject 20 Units into the skin 3 (three) times daily before meals.         . insulin glargine (LANTUS) 100 UNIT/ML injection   Subcutaneous   Inject 0.4 mLs (40 Units total) into the skin 2 (two) times daily.  10 mL   10   . losartan (COZAAR) 100 MG tablet   Oral   Take 1 tablet (100 mg total) by mouth daily.   90 tablet   3   . pregabalin (LYRICA) 100 MG capsule   Oral   Take 1 capsule (100 mg total) by mouth 3 (three) times daily after meals.   90 capsule   3   . cephALEXin (KEFLEX) 500 MG capsule   Oral   Take 1 capsule (500 mg total) by mouth 4 (four) times daily.   40 capsule   0    BP 164/94  Pulse 72  Temp(Src) 98.4 F (36.9 C) (Oral)  Resp 18  Ht 5' (1.524 m)  Wt 182 lb (82.555 kg)  BMI 35.54 kg/m2  SpO2 100% Physical Exam  Constitutional: She is oriented to person, place, and time. She appears well-developed and well-nourished. No distress.  HENT:  Head:  Normocephalic and atraumatic.  Mouth/Throat: Oropharynx is clear and moist. No oropharyngeal exudate.  No temporal artery tenderness  Eyes: Conjunctivae and EOM are normal. Pupils are equal, round, and reactive to light.  Neck: Normal range of motion. Neck supple.  No meningismus  Cardiovascular: Normal rate, regular rhythm and normal heart sounds.   No murmur heard. Pulmonary/Chest: Effort normal and breath sounds normal. No respiratory distress.  Abdominal: Soft. Bowel sounds are normal. There is no tenderness. There is no rebound and no guarding.  Musculoskeletal: Normal range of motion. She exhibits no edema and no tenderness.  Neurological: She is alert and oriented to person, place, and time. No cranial nerve deficit. She exhibits normal muscle tone. Coordination normal.  CN 2-12 intact, no ataxia on finger to nose, no nystagmus, 5/5 strength throughout, no pronator drift, Romberg negative, normal gait.   Skin: Skin is warm.    ED Course  Procedures (including critical care time) Labs Review Labs Reviewed  COMPREHENSIVE METABOLIC PANEL - Abnormal; Notable for the following:    Glucose, Bld 439 (*)    Albumin 3.4 (*)    Alkaline Phosphatase 147 (*)    Total Bilirubin 0.2 (*)    All other components within normal limits  BASIC METABOLIC PANEL - Abnormal; Notable for the following:    Glucose, Bld 318 (*)    Calcium 8.3 (*)    All other components within normal limits  URINALYSIS, ROUTINE W REFLEX MICROSCOPIC - Abnormal; Notable for the following:    APPearance CLOUDY (*)    Glucose, UA >1000 (*)    Hgb urine dipstick SMALL (*)    Protein, ur 30 (*)    Leukocytes, UA MODERATE (*)    All other components within normal limits  URINE MICROSCOPIC-ADD ON - Abnormal; Notable for the following:    Squamous Epithelial / LPF MANY (*)    Bacteria, UA MANY (*)    All other components within normal limits  CBG MONITORING, ED - Abnormal; Notable for the following:     Glucose-Capillary 288 (*)    All other components within normal limits  CBC  DIFFERENTIAL  TROPONIN I   Imaging Review Dg Chest 2 View  10/11/2013   CLINICAL DATA:  Chest pain.  EXAM: CHEST  2 VIEW  COMPARISON:  DG CHEST 2 VIEW dated 07/29/2013  FINDINGS: The heart size and mediastinal contours are within normal limits. Both lungs are clear. The visualized skeletal structures are unremarkable.  IMPRESSION: No active cardiopulmonary disease.   Electronically Signed   By: Margaree Mackintosh M.D.  On: 10/11/2013 16:08   Ct Head Wo Contrast  10/11/2013   CLINICAL DATA:  Headaches  EXAM: CT HEAD WITHOUT CONTRAST  TECHNIQUE: Contiguous axial images were obtained from the base of the skull through the vertex without intravenous contrast.  COMPARISON:  None.  FINDINGS: The bony calvarium is intact. No findings to suggest acute hemorrhage, acute infarction or space-occupying mass lesion are noted.  IMPRESSION: No acute abnormality noted.   Electronically Signed   By: Inez Catalina M.D.   On: 10/11/2013 16:35     EKG Interpretation   Date/Time:  Thursday October 11 2013 14:42:28 EDT Ventricular Rate:  62 PR Interval:  152 QRS Duration: 84 QT Interval:  436 QTC Calculation: 442 R Axis:   9 Text Interpretation:  Normal sinus rhythm Septal infarct , age  undetermined Abnormal ECG No significant change was found Confirmed by  Wyvonnia Dusky  MD, [place] [phone]) on 10/11/2013 3:25:29 PM      MDM   Final diagnoses:  Headache  Hypertension  Urinary tract infection  Hyperglycemia    Patient presents with elevated blood pressure and headache the onset this morning. Seen one week ago for the same. States compliance with her losartan. Denies any focal weakness.  Her neurological exam is nonfocal. CT head is negative. Patient states that it was severe in onset while she was asleep so she was unable to say it was sudden onset or not. Lumbar puncture discussed with patient to evaluate for subarachnoid hemorrhage. I  explained to the patient that subarachnoid hemorrhage cannot be completely ruled out by CT scan alone. She does not want to have the lumbar puncture done.  she understands that her evaluation will be incomplete without it.  Pressure has spontaneously improved to 156/96. Headache has resolved. Neurological exam is benign.  Blood sugar 439 and anion gap 16.  Patient given IV fluids and insulin. She did not take her Lantus today. Recheck BS 318.  No ketones in urine.  No evidence of DKA. Will treat UTI.   Her blood pressure has improved to 150s over 90s. She is advised to keep a log of her blood pressure and her blood sugars. She will leave [place] because she is refusing lumbar puncture to fully evaluate for subarachnoid hemorrhage. She understands that there is a small risk that she may have bleeding in her head which could be life threatening. She accepts this risk and has capacity to make medical decisions.  BP 164/94  Pulse 72  Temp(Src) 98.4 F (36.9 C) (Oral)  Resp 18  Ht 5' (1.524 m)  Wt 182 lb (82.555 kg)  BMI 35.54 kg/m2  SpO2 100%   Ezequiel Essex, MD 10/12/13 [phone]

## 2013-10-11 NOTE — Discharge Instructions (Signed)
Arterial Hypertension You declined a lumbar puncture today. Keep a record of your blood pressure and blood sugars. Follow up with your doctor. Return to the ED if you develop new or worsening symptoms. Arterial hypertension (high blood pressure) is a condition of elevated pressure in your blood vessels. Hypertension over a long period of time is a risk factor for strokes, heart attacks, and heart failure. It is also the leading cause of kidney (renal) failure.  CAUSES   In Adults -- Over 90% of all hypertension has no known cause. This is called essential or primary hypertension. In the other 10% of people with hypertension, the increase in blood pressure is caused by another disorder. This is called secondary hypertension. Important causes of secondary hypertension are:  Heavy alcohol use.  Obstructive sleep apnea.  Hyperaldosterosim (Conn's syndrome).  Steroid use.  Chronic kidney failure.  Hyperparathyroidism.  Medications.  Renal artery stenosis.  Pheochromocytoma.  Cushing's disease.  Coarctation of the aorta.  Scleroderma renal crisis.  Licorice (in excessive amounts).  Drugs (cocaine, methamphetamine). Your caregiver can explain any items above that apply to you.  In Children -- Secondary hypertension is more common and should always be considered.  Pregnancy -- Few women of childbearing age have high blood pressure. However, up to 10% of them develop hypertension of pregnancy. Generally, this will not harm the woman. It may be a sign of 3 complications of pregnancy: preeclampsia, HELLP syndrome, and eclampsia. Follow up and control with medication is necessary. SYMPTOMS   This condition normally does not produce any noticeable symptoms. It is usually found during a routine exam.  Malignant hypertension is a late problem of high blood pressure. It may have the following symptoms:  Headaches.  Blurred vision.  End-organ damage (this means your kidneys, heart,  lungs, and other organs are being damaged).  Stressful situations can increase the blood pressure. If a person with normal blood pressure has their blood pressure go up while being seen by their caregiver, this is often termed "white coat hypertension." Its importance is not known. It may be related with eventually developing hypertension or complications of hypertension.  Hypertension is often confused with mental tension, stress, and anxiety. DIAGNOSIS  The diagnosis is made by 3 separate blood pressure measurements. They are taken at least 1 week apart from each other. If there is organ damage from hypertension, the diagnosis may be made without repeat measurements. Hypertension is usually identified by having blood pressure readings:  Above 140/90 mmHg measured in both arms, at 3 separate times, over a couple weeks.  Over 130/80 mmHg should be considered a risk factor and may require treatment in patients with diabetes. Blood pressure readings over 120/80 mmHg are called "pre-hypertension" even in non-diabetic patients. To get a true blood pressure measurement, use the following guidelines. Be aware of the factors that can alter blood pressure readings.  Take measurements at least 1 hour after caffeine.  Take measurements 30 minutes after smoking and without any stress. This is another reason to quit smoking  it raises your blood pressure.  Use a proper cuff size. Ask your caregiver if you are not sure about your cuff size.  Most home blood pressure cuffs are automatic. They will measure systolic and diastolic pressures. The systolic pressure is the pressure reading at the start of sounds. Diastolic pressure is the pressure at which the sounds disappear. If you are elderly, measure pressures in multiple postures. Try sitting, lying or standing.  Sit at rest for a  minimum of 5 minutes before taking measurements.  You should not be on any medications like decongestants. These are found in  many cold medications.  Record your blood pressure readings and review them with your caregiver. If you have hypertension:  Your caregiver may do tests to be sure you do not have secondary hypertension (see "causes" above).  Your caregiver may also look for signs of metabolic syndrome. This is also called Syndrome X or Insulin Resistance Syndrome. You may have this syndrome if you have type 2 diabetes, abdominal obesity, and abnormal blood lipids in addition to hypertension.  Your caregiver will take your medical and family history and perform a physical exam.  Diagnostic tests may include blood tests (for glucose, cholesterol, potassium, and kidney function), a urinalysis, or an EKG. Other tests may also be necessary depending on your condition. PREVENTION  There are important lifestyle issues that you can adopt to reduce your chance of developing hypertension:  Maintain a normal weight.  Limit the amount of salt (sodium) in your diet.  Exercise often.  Limit alcohol intake.  Get enough potassium in your diet. Discuss specific advice with your caregiver.  Follow a DASH diet (dietary approaches to stop hypertension). This diet is rich in fruits, vegetables, and low-fat dairy products, and avoids certain fats. PROGNOSIS  Essential hypertension cannot be cured. Lifestyle changes and medical treatment can lower blood pressure and reduce complications. The prognosis of secondary hypertension depends on the underlying cause. Many people whose hypertension is controlled with medicine or lifestyle changes can live a normal, healthy life.  RISKS AND COMPLICATIONS  While high blood pressure alone is not an illness, it often requires treatment due to its short- and long-term effects on many organs. Hypertension increases your risk for:  CVAs or strokes (cerebrovascular accident).  Heart failure due to chronically high blood pressure (hypertensive cardiomyopathy).  Heart attack (myocardial  infarction).  Damage to the retina (hypertensive retinopathy).  Kidney failure (hypertensive nephropathy). Your caregiver can explain list items above that apply to you. Treatment of hypertension can significantly reduce the risk of complications. TREATMENT   For overweight patients, weight loss and regular exercise are recommended. Physical fitness lowers blood pressure.  Mild hypertension is usually treated with diet and exercise. A diet rich in fruits and vegetables, fat-free dairy products, and foods low in fat and salt (sodium) can help lower blood pressure. Decreasing salt intake decreases blood pressure in a 1/3 of people.  Stop smoking if you are a smoker. The steps above are highly effective in reducing blood pressure. While these actions are easy to suggest, they are difficult to achieve. Most patients with moderate or severe hypertension end up requiring medications to bring their blood pressure down to a normal level. There are several classes of medications for treatment. Blood pressure pills (antihypertensives) will lower blood pressure by their different actions. Lowering the blood pressure by 10 mmHg may decrease the risk of complications by as much as 25%. The goal of treatment is effective blood pressure control. This will reduce your risk for complications. Your caregiver will help you determine the best treatment for you according to your lifestyle. What is excellent treatment for one person, may not be for you. HOME CARE INSTRUCTIONS   Do not smoke.  Follow the lifestyle changes outlined in the "Prevention" section.  If you are on medications, follow the directions carefully. Blood pressure medications must be taken as prescribed. Skipping doses reduces their benefit. It also puts you at risk for  problems.  Follow up with your caregiver, as directed.  If you are asked to monitor your blood pressure at home, follow the guidelines in the "Diagnosis" section above. SEEK  MEDICAL CARE IF:   You think you are having medication side effects.  You have recurrent headaches or lightheadedness.  You have swelling in your ankles.  You have trouble with your vision. SEEK IMMEDIATE MEDICAL CARE IF:   You have sudden onset of chest pain or pressure, difficulty breathing, or other symptoms of a heart attack.  You have a severe headache.  You have symptoms of a stroke (such as sudden weakness, difficulty speaking, difficulty walking). MAKE SURE YOU:   Understand these instructions.  Will watch your condition.  Will get help right away if you are not doing well or get worse. Document Released: 06/21/2005 Document Revised: 09/13/2011 Document Reviewed: 01/19/2007 [facility] Patient Information 2014 [place].  Urinary Tract Infection Urinary tract infections (UTIs) can develop anywhere along your urinary tract. Your urinary tract is your body's drainage system for removing wastes and extra water. Your urinary tract includes two kidneys, two ureters, a bladder, and a urethra. Your kidneys are a pair of bean-shaped organs. Each kidney is about the size of your fist. They are located below your ribs, one on each side of your spine. CAUSES Infections are caused by microbes, which are microscopic organisms, including fungi, viruses, and bacteria. These organisms are so small that they can only be seen through a microscope. Bacteria are the microbes that most commonly cause UTIs. SYMPTOMS  Symptoms of UTIs may vary by age and gender of the patient and by the location of the infection. Symptoms in young women typically include a frequent and intense urge to urinate and a painful, burning feeling in the bladder or urethra during urination. Older women and men are more likely to be tired, shaky, and weak and have muscle aches and abdominal pain. A fever may mean the infection is in your kidneys. Other symptoms of a kidney infection include pain in your back or sides below  the ribs, nausea, and vomiting. DIAGNOSIS To diagnose a UTI, your caregiver will ask you about your symptoms. Your caregiver also will ask to provide a urine sample. The urine sample will be tested for bacteria and white blood cells. White blood cells are made by your body to help fight infection. TREATMENT  Typically, UTIs can be treated with medication. Because most UTIs are caused by a bacterial infection, they usually can be treated with the use of antibiotics. The choice of antibiotic and length of treatment depend on your symptoms and the type of bacteria causing your infection. HOME CARE INSTRUCTIONS  If you were prescribed antibiotics, take them exactly as your caregiver instructs you. Finish the medication even if you feel better after you have only taken some of the medication.  Drink enough water and fluids to keep your urine clear or pale yellow.  Avoid caffeine, tea, and carbonated beverages. They tend to irritate your bladder.  Empty your bladder often. Avoid holding urine for long periods of time.  Empty your bladder before and after sexual intercourse.  After a bowel movement, women should cleanse from front to back. Use each tissue only once. SEEK MEDICAL CARE IF:   You have back pain.  You develop a fever.  Your symptoms do not begin to resolve within 3 days. SEEK IMMEDIATE MEDICAL CARE IF:   You have severe back pain or lower abdominal pain.  You develop  chills.  You have nausea or vomiting.  You have continued burning or discomfort with urination. MAKE SURE YOU:   Understand these instructions.  Will watch your condition.  Will get help right away if you are not doing well or get worse. Document Released: 03/31/2005 Document Revised: 12/21/2011 Document Reviewed: 07/30/2011 [facility] Patient Information 2014 [place].

## 2013-10-11 NOTE — ED Notes (Signed)
MD at bedside. 

## 2013-10-11 NOTE — ED Notes (Signed)
Patient transported to X-ray 

## 2013-10-11 NOTE — ED Notes (Signed)
Pt here for tx of htn and headaches.  States her dr increased her losartan to 100 mg 3 weeks ago.  However, her sbp has been in the 180's.  Presently 180/106.  Pt has been experiencing occipital headaches.  Today she woke up at 1130 with an occipital headache worse than normal and some L arm numbness.

## 2013-10-12 ENCOUNTER — Ambulatory Visit: Payer: Managed Care, Other (non HMO) | Attending: Internal Medicine | Admitting: Internal Medicine

## 2013-10-12 VITALS — BP 150/100 | HR 83

## 2013-10-12 DIAGNOSIS — R42 Dizziness and giddiness: Secondary | ICD-10-CM | POA: Insufficient documentation

## 2013-10-12 DIAGNOSIS — IMO0002 Reserved for concepts with insufficient information to code with codable children: Secondary | ICD-10-CM | POA: Insufficient documentation

## 2013-10-12 DIAGNOSIS — M549 Dorsalgia, unspecified: Secondary | ICD-10-CM

## 2013-10-12 DIAGNOSIS — E1165 Type 2 diabetes mellitus with hyperglycemia: Secondary | ICD-10-CM | POA: Insufficient documentation

## 2013-10-12 DIAGNOSIS — IMO0001 Reserved for inherently not codable concepts without codable children: Secondary | ICD-10-CM | POA: Insufficient documentation

## 2013-10-12 DIAGNOSIS — R739 Hyperglycemia, unspecified: Secondary | ICD-10-CM

## 2013-10-12 DIAGNOSIS — I10 Essential (primary) hypertension: Secondary | ICD-10-CM | POA: Insufficient documentation

## 2013-10-12 DIAGNOSIS — R7309 Other abnormal glucose: Secondary | ICD-10-CM

## 2013-10-12 LAB — GLUCOSE, POCT (MANUAL RESULT ENTRY)
POC Glucose: 367 mg/dl — AB (ref 70–99)
POC Glucose: 395 mg/dl — AB (ref 70–99)
POC Glucose: 283 mg/dl — AB (ref 70–99)

## 2013-10-12 MED ORDER — HYDROCHLOROTHIAZIDE 12.5 MG PO TABS
12.5000 mg | ORAL_TABLET | Freq: Every day | ORAL | Status: DC
Start: 2013-10-12 — End: 2013-11-28

## 2013-10-12 MED ORDER — ACETAMINOPHEN-CODEINE #3 300-30 MG PO TABS: 1.0000 | ORAL_TABLET | ORAL | Status: DC | PRN

## 2013-10-12 MED ORDER — INSULIN ASPART 100 UNIT/ML ~~LOC~~ SOLN
20.0000 [IU] | Freq: Once | SUBCUTANEOUS | Status: AC
  Administered 2013-10-12: 20 [IU] via SUBCUTANEOUS

## 2013-10-12 NOTE — Progress Notes (Signed)
Patient came in for nurse visit complaining of dizziness that began this morning. BP checked-156/99 in left arm, 153/91 in right arm. Patient's blood glucose checked-395 Patient indicates she checked her blood glucose around 1100 and her blood glucose was over [id number] (she was uncertain of exact number). She administered 20U of Novolog. Spoke with Dr. Doreene Burke who indicated patient should be given an additional 20U of Novolog. Insulin administered.  Addendum-blood glucose rechecked. Dr. Doreene Burke notified of results. MD to see patient for office visit.

## 2013-10-12 NOTE — Progress Notes (Signed)
Patient ID: [name] [name], female   DOB: [birth year], 48 y.o.   MRN: [id number]   Trishelle Francisco, is a 48 y.o. female  [id number]  [id number]  DOB - 1966-02-03  No chief complaint on file.       Subjective:   [name] [name] is a 48 y.o. female here today for a follow up visit. Patient came in for nurse visit complaining of dizziness that began this morning. BP checked-156/99 in left arm, 153/91 in right arm. Patient's blood glucose checked-395  Patient indicates she checked her blood glucose around 1100 and her blood glucose was over [id number] (she was uncertain of exact number). She administered 20U of Novolog. Patient was seen by me, she was in the ED yesterday for the same symptoms and she was given IV fluid and blood sugar was controlled before she was sent home. Today her blood sugars to very high as well as her blood pressure, she has been given additional 20 units of insulin in the clinic.  Patient has No headache, No chest pain, No abdominal pain - No Nausea, No new weakness tingling or numbness, No Cough - SOB.  Problem  Dizziness and Giddiness  Hyperglycemia  Uncontrolled Diabetes Mellitus  Uncontrolled Hypertension    ALLERGIES: No Known Allergies  PAST MEDICAL HISTORY: Past Medical History  Diagnosis Date  . Diabetes mellitus   . Hypertension     MEDICATIONS AT HOME: Prior to Admission medications   Medication Sig Start Date End Date Taking? Authorizing Provider  acetaminophen-codeine (TYLENOL #3) 300-30 MG per tablet Take 1 tablet by mouth every 4 (four) hours as needed. 10/12/13   Angelica Chessman, MD  cephALEXin (KEFLEX) 500 MG capsule Take 1 capsule (500 mg total) by mouth 4 (four) times daily. 10/11/13   Ezequiel Essex, MD  fish oil-omega-3 fatty acids 1000 MG capsule Take 2 capsules (2 g total) by mouth daily. 01/09/13   Robbie Lis, MD  hydrochlorothiazide (HYDRODIURIL) 12.5 MG tablet Take 1 tablet (12.5 mg total) by mouth daily. 10/12/13   Angelica Chessman, MD    insulin aspart (NOVOLOG) 100 UNIT/ML injection Inject 20 Units into the skin 3 (three) times daily before meals.    Historical Provider, MD  insulin glargine (LANTUS) 100 UNIT/ML injection Inject 0.4 mLs (40 Units total) into the skin 2 (two) times daily. 08/13/13   Reyne Dumas, MD  losartan (COZAAR) 100 MG tablet Take 1 tablet (100 mg total) by mouth daily. 10/04/13   Reyne Dumas, MD  pregabalin (LYRICA) 100 MG capsule Take 1 capsule (100 mg total) by mouth 3 (three) times daily after meals. 09/10/13   Angelica Chessman, MD     Objective:   Filed Vitals:   10/12/13 1205 10/12/13 1206 10/12/13 1424  BP: 156/99 153/91 150/100  Pulse: 83      Exam General appearance : Awake, alert, not in any distress. Speech Clear. Not toxic looking HEENT: Atraumatic and Normocephalic, pupils equally reactive to light and accomodation Neck: supple, no JVD. No cervical lymphadenopathy.  Chest:Good air entry bilaterally, no added sounds  CVS: S1 S2 regular, no murmurs.  Abdomen: Bowel sounds present, Non tender and not distended with no gaurding, rigidity or rebound. Extremities: B/L Lower Ext shows no edema, both legs are warm to touch Neurology: Awake alert, and oriented X 3, CN II-XII intact, Non focal Skin:No Rash Wounds:N/A  Data Review Lab Results  Component Value Date   HGBA1C 14.0 08/13/2013   HGBA1C 13.8* 05/09/2013   HGBA1C TEST NOT PERFORMED 03/27/2013  Assessment & Plan   1. Dizziness and giddiness  - Glucose (CBG), showed hyperglycemia Patient was observed for about one hour in the clinic for blood pressure and blood sugar control Blood sugar came down to about 280 within the hour Headache and dizziness improved  2. Hyperglycemia  - Glucose (CBG) - insulin aspart (novoLOG) injection 20 Units; Inject 0.2 mLs (20 Units total) into the skin once in the clinic - Glucose (CBG) - Glucose (CBG)  3. Uncontrolled diabetes mellitus Increase Lantus insulin to 50 units twice a  day Patient has been educated about signs and symptoms of hypoglycemia  4. Uncontrolled hypertension Add hydrochlorothiazide to losartan - hydrochlorothiazide (HYDRODIURIL) 12.5 MG tablet; Take 1 tablet (12.5 mg total) by mouth daily.  Dispense: 90 tablet; Refill: 3 Return in one week for blood pressure check  5. Back pain  - acetaminophen-codeine (TYLENOL #3) 300-30 MG per tablet; Take 1 tablet by mouth every 4 (four) hours as needed.  Dispense: 60 tablet; Refill: 0  Patient was extensively counseled on medication compliance, nutritional and exercise  Return in about 1 week (around 10/19/2013), or if symptoms worsen or fail to improve, for BP Check, Nurse Visit.  The patient was given clear instructions to go to ER or return to medical center if symptoms don't improve, worsen or new problems develop. The patient verbalized understanding. The patient was told to call to get lab results if they haven't heard anything in the next week.   This note has been created with Surveyor, quantity. Any transcriptional errors are unintentional.    Angelica Chessman, MD, [place], [place], [place] and [facility] [place], [place]   10/12/2013, 2:39 PM

## 2013-10-12 NOTE — Patient Instructions (Addendum)
Pt instructed to take insulin dose tonight and if blood pressure and CBG still elevated go to ER Pt needs to return on Monday for repeat bp/cbg  DASH Diet The DASH diet stands for "Dietary Approaches to Stop Hypertension." It is a healthy eating plan that has been shown to reduce high blood pressure (hypertension) in as little as 14 days, while also possibly providing other significant health benefits. These other health benefits include reducing the risk of breast cancer after menopause and reducing the risk of type 2 diabetes, heart disease, colon cancer, and stroke. Health benefits also include weight loss and slowing kidney failure in patients with chronic kidney disease.  DIET GUIDELINES  Limit salt (sodium). Your diet should contain less than 1500 mg of sodium daily.  Limit refined or processed carbohydrates. Your diet should include mostly whole grains. Desserts and added sugars should be used sparingly.  Include small amounts of heart-healthy fats. These types of fats include nuts, oils, and tub margarine. Limit saturated and trans fats. These fats have been shown to be harmful in the body. CHOOSING FOODS  The following food groups are based on a 2000 calorie diet. See your Registered Dietitian for individual calorie needs. Grains and Grain Products (6 to 8 servings daily)  Eat More Often: Whole-wheat bread, brown rice, whole-grain or wheat pasta, quinoa, popcorn without added fat or salt (air popped).  Eat Less Often: White bread, white pasta, white rice, cornbread. Vegetables (4 to 5 servings daily)  Eat More Often: Fresh, frozen, and canned vegetables. Vegetables may be raw, steamed, roasted, or grilled with a minimal amount of fat.  Eat Less Often/Avoid: Creamed or fried vegetables. Vegetables in a cheese sauce. Fruit (4 to 5 servings daily)  Eat More Often: All fresh, canned (in natural juice), or frozen fruits. Dried fruits without added sugar. One hundred percent fruit juice  ( cup [237 mL] daily).  Eat Less Often: Dried fruits with added sugar. Canned fruit in light or heavy syrup. [employer], Fish, and Poultry (2 servings or less daily. One serving is 3 to 4 oz [85-114 g]).  Eat More Often: Ninety percent or leaner ground beef, tenderloin, sirloin. Round cuts of beef, chicken breast, [place] breast. All fish. Grill, bake, or broil your meat. Nothing should be fried.  Eat Less Often/Avoid: Fatty cuts of meat, [place], or chicken leg, thigh, or wing. Fried cuts of meat or fish. Dairy (2 to 3 servings)  Eat More Often: Low-fat or fat-free milk, low-fat plain or light yogurt, reduced-fat or part-skim cheese.  Eat Less Often/Avoid: Milk (whole, 2%).Whole milk yogurt. Full-fat cheeses. Nuts, Seeds, and Legumes (4 to 5 servings per week)  Eat More Often: All without added salt.  Eat Less Often/Avoid: Salted nuts and seeds, canned beans with added salt. Fats and Sweets (limited)  Eat More Often: Vegetable oils, tub margarines without trans fats, sugar-free gelatin. Mayonnaise and salad dressings.  Eat Less Often/Avoid: Coconut oils, palm oils, butter, stick margarine, cream, half and half, cookies, candy, pie. FOR MORE INFORMATION The Dash Diet Eating Plan: www.dashdiet.org Document Released: 06/10/2011 Document Revised: 09/13/2011 Document Reviewed: 06/10/2011 [facility] Patient Information 2014 [place], [place]. Diabetes Meal Planning Guide The diabetes meal planning guide is a tool to help you plan your meals and snacks. It is important for people with diabetes to manage their blood glucose (sugar) levels. Choosing the right foods and the right amounts throughout your day will help control your blood glucose. Eating right can even help you improve your blood  pressure and reach or maintain a healthy weight. CARBOHYDRATE COUNTING MADE EASY When you eat carbohydrates, they turn to sugar. This raises your blood glucose level. Counting carbohydrates can help you  control this level so you feel better. When you plan your meals by counting carbohydrates, you can have more flexibility in what you eat and balance your medicine with your food intake. Carbohydrate counting simply means adding up the total amount of carbohydrate grams in your meals and snacks. Try to eat about the same amount at each meal. Foods with carbohydrates are listed below. Each portion below is 1 carbohydrate serving or 15 grams of carbohydrates. Ask your dietician how many grams of carbohydrates you should eat at each meal or snack. Grains and Starches  1 slice bread.   English muffin or hotdog/hamburger bun.   cup cold cereal (unsweetened).   cup cooked pasta or rice.   cup starchy vegetables (corn, potatoes, peas, beans, winter squash).  1 tortilla (6 inches).   bagel.  1 waffle or pancake (size of a CD).   cup cooked cereal.  4 to 6 small crackers. *Whole grain is recommended. Fruit  1 cup fresh unsweetened berries, melon, papaya, pineapple.  1 small fresh fruit.   banana or mango.   cup fruit juice (4 oz unsweetened).   cup canned fruit in natural juice or water.  2 tbs dried fruit.  12 to 15 grapes or cherries. Milk and Yogurt  1 cup fat-free or 1% milk.  1 cup soy milk.  6 oz light yogurt with sugar-free sweetener.  6 oz low-fat soy yogurt.  6 oz plain yogurt. Vegetables  1 cup raw or  cup cooked is counted as 0 carbohydrates or a "free" food.  If you eat 3 or more servings at 1 meal, count them as 1 carbohydrate serving. Other Carbohydrates   oz chips or pretzels.   cup ice cream or frozen yogurt.   cup sherbet or sorbet.  2 inch square cake, no frosting.  1 tbs honey, sugar, jam, jelly, or syrup.  2 small cookies.  3 squares of graham crackers.  3 cups popcorn.  6 crackers.  1 cup broth-based soup.  Count 1 cup casserole or other mixed foods as 2 carbohydrate servings.  Foods with less than 20 calories in a  serving may be counted as 0 carbohydrates or a "free" food. You may want to purchase a book or computer software that lists the carbohydrate gram counts of different foods. In addition, the nutrition facts panel on the labels of the foods you eat are a good source of this information. The label will tell you how big the serving size is and the total number of carbohydrate grams you will be eating per serving. Divide this number by 15 to obtain the number of carbohydrate servings in a portion. Remember, 1 carbohydrate serving equals 15 grams of carbohydrate. SERVING SIZES Measuring foods and serving sizes helps you make sure you are getting the right amount of food. The list below tells how big or small some common serving sizes are.  1 oz.........4 stacked dice.  3 oz........Marland KitchenDeck of cards.  1 tsp.......Marland KitchenTip of little finger.  1 tbs......Marland KitchenMarland KitchenThumb.  2 tbs.......Marland KitchenGolf ball.   cup......Marland KitchenHalf of a fist.  1 cup.......Marland KitchenA fist. SAMPLE DIABETES MEAL PLAN Below is a sample meal plan that includes foods from the grain and starches, dairy, vegetable, fruit, and meat groups. A dietician can individualize a meal plan to fit your calorie needs and tell you  the number of servings needed from each food group. However, controlling the total amount of carbohydrates in your meal or snack is more important than making sure you include all of the food groups at every meal. You may interchange carbohydrate containing foods (dairy, starches, and fruits). The meal plan below is an example of a 2000 calorie diet using carbohydrate counting. This meal plan has 17 carbohydrate servings. Breakfast  1 cup oatmeal (2 carb servings).   cup light yogurt (1 carb serving).  1 cup blueberries (1 carb serving).   cup almonds. Snack  1 large apple (2 carb servings).  1 low-fat string cheese stick. Lunch  Chicken breast salad.  1 cup spinach.   cup chopped tomatoes.  2 oz chicken breast, sliced.  2 tbs  low-fat [place] dressing.  12 whole-wheat crackers (2 carb servings).  12 to 15 grapes (1 carb serving).  1 cup low-fat milk (1 carb serving). Snack  1 cup carrots.   cup hummus (1 carb serving). Dinner  3 oz broiled salmon.  1 cup brown rice (3 carb servings). Snack  1  cups steamed broccoli (1 carb serving) drizzled with 1 tsp olive oil and lemon juice.  1 cup light pudding (2 carb servings). DIABETES MEAL PLANNING WORKSHEET Your dietician can use this worksheet to help you decide how many servings of foods and what types of foods are right for you.  BREAKFAST Food Group and Servings / Carb Servings Grain/Starches __________________________________ Dairy __________________________________________ Vegetable ______________________________________ Fruit ___________________________________________ Meat __________________________________________ Fat ____________________________________________ LUNCH Food Group and Servings / Carb Servings Grain/Starches ___________________________________ Dairy ___________________________________________ Fruit ____________________________________________ Meat ___________________________________________ Fat _____________________________________________ [employer] Food Group and Servings / Carb Servings Grain/Starches ___________________________________ Dairy ___________________________________________ Fruit ____________________________________________ Meat ___________________________________________ Fat _____________________________________________ SNACKS Food Group and Servings / Carb Servings Grain/Starches ___________________________________ Dairy ___________________________________________ Vegetable _______________________________________ Fruit ____________________________________________ Meat ___________________________________________ Fat _____________________________________________ DAILY TOTALS Starches  _________________________ Vegetable ________________________ Fruit ____________________________ Dairy ____________________________ Meat ____________________________ Fat ______________________________ Document Released: 03/18/2005 Document Revised: 09/13/2011 Document Reviewed: 01/27/2009 [employer] Patient Information 2014 [employer].

## 2013-10-14 ENCOUNTER — Emergency Department (HOSPITAL_COMMUNITY): Payer: Managed Care, Other (non HMO)

## 2013-10-14 ENCOUNTER — Emergency Department (HOSPITAL_COMMUNITY)
Admission: EM | Admit: 2013-10-14 | Discharge: 2013-10-14 | Disposition: A | Discharge status: Home / Self Care | Payer: Managed Care, Other (non HMO) | Attending: Emergency Medicine | Admitting: Emergency Medicine

## 2013-10-14 ENCOUNTER — Encounter (HOSPITAL_COMMUNITY): Payer: Self-pay | Admitting: Emergency Medicine

## 2013-10-14 DIAGNOSIS — R519 Headache, unspecified: Secondary | ICD-10-CM

## 2013-10-14 DIAGNOSIS — R079 Chest pain, unspecified: Secondary | ICD-10-CM | POA: Insufficient documentation

## 2013-10-14 DIAGNOSIS — Z79899 Other long term (current) drug therapy: Secondary | ICD-10-CM | POA: Insufficient documentation

## 2013-10-14 DIAGNOSIS — M7989 Other specified soft tissue disorders: Secondary | ICD-10-CM | POA: Insufficient documentation

## 2013-10-14 DIAGNOSIS — E119 Type 2 diabetes mellitus without complications: Secondary | ICD-10-CM | POA: Insufficient documentation

## 2013-10-14 DIAGNOSIS — R112 Nausea with vomiting, unspecified: Secondary | ICD-10-CM | POA: Insufficient documentation

## 2013-10-14 DIAGNOSIS — R51 Headache: Secondary | ICD-10-CM | POA: Insufficient documentation

## 2013-10-14 DIAGNOSIS — Z794 Long term (current) use of insulin: Secondary | ICD-10-CM | POA: Insufficient documentation

## 2013-10-14 DIAGNOSIS — I10 Essential (primary) hypertension: Secondary | ICD-10-CM | POA: Insufficient documentation

## 2013-10-14 DIAGNOSIS — Z792 Long term (current) use of antibiotics: Secondary | ICD-10-CM | POA: Insufficient documentation

## 2013-10-14 LAB — COMPREHENSIVE METABOLIC PANEL
ALT: 17 U/L (ref 0–35)
AST: 19 U/L (ref 0–37)
Albumin: 3.1 g/dL — ABNORMAL LOW (ref 3.5–5.2)
Alkaline Phosphatase: 110 U/L (ref 39–117)
BUN: 14 mg/dL (ref 6–23)
CO2: 26 mEq/L (ref 19–32)
Calcium: 9.1 mg/dL (ref 8.4–10.5)
Chloride: 101 mEq/L (ref 96–112)
Creatinine, Ser: 0.62 mg/dL (ref 0.50–1.10)
GFR calc Af Amer: >90 mL/min (ref >90)
GFR calc non Af Amer: >90 mL/min (ref >90)
Glucose, Bld: 118 mg/dL — ABNORMAL HIGH (ref 70–99)
Potassium: 3.9 mEq/L (ref 3.7–5.3)
Sodium: 142 mEq/L (ref 137–147)
Total Bilirubin: <0.2 mg/dL — ABNORMAL LOW (ref 0.3–1.2)
Total Protein: 6.7 g/dL (ref 6.0–8.3)

## 2013-10-14 LAB — URINALYSIS, ROUTINE W REFLEX MICROSCOPIC
Bilirubin Urine: NEGATIVE
Glucose, UA: 100 mg/dL — AB
Ketones, ur: NEGATIVE mg/dL
Nitrite: POSITIVE — AB
Protein, ur: 100 mg/dL — AB
Specific Gravity, Urine: 1.013 (ref 1.005–1.030)
Urobilinogen, UA: 0.2 mg/dL (ref 0.0–1.0)
pH: 7 (ref 5.0–8.0)

## 2013-10-14 LAB — TROPONIN I
Troponin I: <0.3 ng/mL (ref <0.30)
Troponin I: <0.3 ng/mL (ref <0.30)

## 2013-10-14 LAB — CBC
HCT: 38.4 % (ref 36.0–46.0)
Hemoglobin: 13 g/dL (ref 12.0–15.0)
MCH: 27.4 pg (ref 26.0–34.0)
MCHC: 33.9 g/dL (ref 30.0–36.0)
MCV: 81 fL (ref 78.0–100.0)
Platelets: 178 10*3/uL (ref 150–400)
RBC: 4.74 MIL/uL (ref 3.87–5.11)
RDW: 13.1 % (ref 11.5–15.5)
WBC: 6.1 10*3/uL (ref 4.0–10.5)

## 2013-10-14 LAB — URINE MICROSCOPIC-ADD ON

## 2013-10-14 LAB — CBG MONITORING, ED: Glucose-Capillary: 120 mg/dL — ABNORMAL HIGH (ref 70–99)

## 2013-10-14 MED ORDER — DIPHENHYDRAMINE HCL 50 MG/ML IJ SOLN
12.5000 mg | Freq: Once | INTRAMUSCULAR | Status: AC
  Administered 2013-10-14: 12.5 mg via INTRAVENOUS
  Filled 2013-10-14: qty 1

## 2013-10-14 MED ORDER — METOCLOPRAMIDE HCL 5 MG/ML IJ SOLN
10.0000 mg | Freq: Once | INTRAMUSCULAR | Status: AC
  Administered 2013-10-14: 10 mg via INTRAVENOUS
  Filled 2013-10-14: qty 2

## 2013-10-14 MED ORDER — ONDANSETRON HCL 4 MG/2ML IJ SOLN
4.0000 mg | Freq: Once | INTRAMUSCULAR | Status: AC
  Administered 2013-10-14: 4 mg via INTRAVENOUS
  Filled 2013-10-14: qty 2

## 2013-10-14 MED ORDER — DEXTROSE 5 % IV SOLN
1.0000 g | Freq: Once | INTRAVENOUS | Status: AC
  Administered 2013-10-14: 1 g via INTRAVENOUS
  Filled 2013-10-14: qty 10

## 2013-10-14 MED ORDER — HYDROCODONE-ACETAMINOPHEN 5-325 MG PO TABS
2.0000 | ORAL_TABLET | Freq: Once | ORAL | Status: AC
  Administered 2013-10-14: 2 via ORAL
  Filled 2013-10-14: qty 2

## 2013-10-14 MED ORDER — KETOROLAC TROMETHAMINE 30 MG/ML IJ SOLN
30.0000 mg | Freq: Once | INTRAMUSCULAR | Status: AC
  Administered 2013-10-14: 30 mg via INTRAVENOUS
  Filled 2013-10-14: qty 1

## 2013-10-14 NOTE — ED Provider Notes (Signed)
Medical screening examination/treatment/procedure(s) were performed by non-physician practitioner and as supervising physician I was immediately available for consultation/collaboration.   EKG Interpretation   Date/Time:  Sunday October 14 2013 08:13:33 EDT Ventricular Rate:  84 PR Interval:  157 QRS Duration: 90 QT Interval:  373 QTC Calculation: 441 R Axis:   14 Text Interpretation:  Sinus rhythm Anteroseptal infarct, old Baseline  wander in lead(s) II III aVL aVF No significant change since last tracing  11 October 2013 Confirmed by [facility]  MD-I, Herman Fiero ([id number]) on 10/14/2013 8:23:17  AM      Rolland Porter, MD, Alanson Aly, MD 10/14/13 1443

## 2013-10-14 NOTE — ED Provider Notes (Signed)
CSN: [id number]     Arrival date & time 10/14/13  0751 History   First MD Initiated Contact with Patient 10/14/13 0753     Chief Complaint  Patient presents with  . Hypertension  . Headache  . Chest Pain     (Consider location/radiation/quality/duration/timing/severity/associated sxs/prior Treatment) The history is provided by the patient and medical records. No language interpreter was used.    [name] [name] is a 48 y.o. female  with a hx of HTN, IDDM presents to the Emergency Department complaining of headache upon waking this AM. She had 1 episode of emesis with the headache and found herself to be hypertensive to [id number] systolic. Associated symptoms include dull/achy left sided chest pain which radiates to the left axilla and is not aggravated or alleviated with anything. She also endorses bilateral leg swelling beginning last night.  Pt reports she was seen at her PCP last week where they added HCTZ to the regimen. She reports not taking her BP medications or insulin this AM.  She reports.  Pt denies fever, chills, neck, pain, SOB, abd pain, diarrhea, weakness, dizziness, dysuria.      Past Medical History  Diagnosis Date  . Diabetes mellitus   . Hypertension    Past Surgical History  Procedure Laterality Date  . Abdominal hysterectomy    . Tubal ligation    . Tonsillectomy    . Cesarean section    . Ganglion cyst excision    . Cholecystectomy    . Eye surgery Left retinal   Family History  Problem Relation Age of Onset  . Diabetes Father   . Heart attack Father    History  Substance Use Topics  . Smoking status: Never Smoker   . Smokeless tobacco: Never Used  . Alcohol Use: No   OB History   Grav Para Term Preterm Abortions TAB SAB Ect Mult Living                 Review of Systems  Constitutional: Negative for fever, diaphoresis, appetite change, fatigue and unexpected weight change.  HENT: Negative for mouth sores.   Eyes: Negative for visual disturbance.   Respiratory: Negative for cough, chest tightness, shortness of breath and wheezing.   Cardiovascular: Positive for chest pain and leg swelling.  Gastrointestinal: Positive for nausea and vomiting (x1). Negative for abdominal pain, diarrhea and constipation.  Endocrine: Negative for polydipsia, polyphagia and polyuria.  Genitourinary: Negative for dysuria, urgency, frequency and hematuria.  Musculoskeletal: Negative for back pain and neck stiffness.  Skin: Negative for rash.  Allergic/Immunologic: Negative for immunocompromised state.  Neurological: Positive for headaches. Negative for syncope and light-headedness.  Hematological: Does not bruise/bleed easily.  Psychiatric/Behavioral: Negative for sleep disturbance. The patient is not nervous/anxious.       Allergies  Review of patient's allergies indicates no known allergies.  Home Medications   Current Outpatient Rx  Name  Route  Sig  Dispense  Refill  . acetaminophen-codeine (TYLENOL #3) 300-30 MG per tablet   Oral   Take 1 tablet by mouth every 4 (four) hours as needed.   60 tablet   0   . cephALEXin (KEFLEX) 500 MG capsule   Oral   Take 1 capsule (500 mg total) by mouth 4 (four) times daily.   40 capsule   0   . fish oil-omega-3 fatty acids 1000 MG capsule   Oral   Take 2 capsules (2 g total) by mouth daily.   30 capsule   3   .  hydrochlorothiazide (HYDRODIURIL) 12.5 MG tablet   Oral   Take 1 tablet (12.5 mg total) by mouth daily.   90 tablet   3   . insulin aspart (NOVOLOG) 100 UNIT/ML injection   Subcutaneous   Inject 20 Units into the skin 3 (three) times daily before meals.         . insulin glargine (LANTUS) 100 UNIT/ML injection   Subcutaneous   Inject 0.4 mLs (40 Units total) into the skin 2 (two) times daily.   10 mL   10   . losartan (COZAAR) 100 MG tablet   Oral   Take 1 tablet (100 mg total) by mouth daily.   90 tablet   3   . pregabalin (LYRICA) 100 MG capsule   Oral   Take 1 capsule  (100 mg total) by mouth 3 (three) times daily after meals.   90 capsule   3    BP 115/70  Pulse 72  Temp(Src) 97.9 F (36.6 C) (Oral)  Resp 16  Ht 5\' 6"  (1.676 m)  Wt 182 lb (82.555 kg)  BMI 29.39 kg/m2  SpO2 99% Physical Exam  Nursing note and vitals reviewed. Constitutional: She is oriented to person, place, and time. She appears well-developed and well-nourished. No distress.  Awake, alert, nontoxic appearance  HENT:  Head: Normocephalic and atraumatic.  Mouth/Throat: Oropharynx is clear and moist. No oropharyngeal exudate.  Eyes: Conjunctivae and EOM are normal. Pupils are equal, round, and reactive to light. No scleral icterus.  Neck: Normal range of motion. Neck supple.  Cardiovascular: Normal rate, regular rhythm, normal heart sounds and intact distal pulses.   No murmur heard. No tachycardia  Pulmonary/Chest: Effort normal and breath sounds normal. No respiratory distress. She has no wheezes. She has no rales.  Clear and equal breath sounds  Abdominal: Soft. Bowel sounds are normal. She exhibits no mass. There is no tenderness. There is no rebound and no guarding.  Musculoskeletal: Normal range of motion. She exhibits no edema.  No peripheral edema noted to the lower legs No palpable cord, neg homan's sign  Lymphadenopathy:    She has no cervical adenopathy.  Neurological: She is alert and oriented to person, place, and time. She has normal reflexes. No cranial nerve deficit. She exhibits normal muscle tone. Coordination normal.  Mental Status:  Alert, oriented, thought content appropriate. Speech fluent without evidence of aphasia. Able to follow 2 step commands without difficulty.  Cranial Nerves:  II:  Peripheral visual fields grossly normal, pupils equal, round, reactive to light III,IV, VI: ptosis not present, extra-ocular motions intact bilaterally  V,VII: smile symmetric, facial light touch sensation equal VIII: hearing grossly normal bilaterally  IX,X: gag  reflex present  XI: bilateral shoulder shrug equal and strong XII: midline tongue extension  Motor:  5/5 in upper and lower extremities bilaterally including strong and equal grip strength and dorsiflexion/plantar flexion Sensory: Pinprick and light touch normal in all extremities.  Deep Tendon Reflexes: 2+ and symmetric  Cerebellar: normal finger-to-nose with bilateral upper extremities Gait: normal gait and balance CV: distal pulses palpable throughout   Skin: Skin is warm and dry. No rash noted. She is not diaphoretic.  Psychiatric: She has a normal mood and affect. Her behavior is normal. Judgment and thought content normal.    ED Course  Procedures (including critical care time) Labs Review Labs Reviewed  COMPREHENSIVE METABOLIC PANEL - Abnormal; Notable for the following:    Glucose, Bld 118 (*)    Albumin 3.1 (*)  Total Bilirubin <0.2 (*)    All other components within normal limits  URINALYSIS, ROUTINE W REFLEX MICROSCOPIC - Abnormal; Notable for the following:    APPearance TURBID (*)    Glucose, UA 100 (*)    Hgb urine dipstick MODERATE (*)    Protein, ur 100 (*)    Nitrite POSITIVE (*)    Leukocytes, UA LARGE (*)    All other components within normal limits  URINE MICROSCOPIC-ADD ON - Abnormal; Notable for the following:    Squamous Epithelial / LPF MANY (*)    Bacteria, UA MANY (*)    All other components within normal limits  CBG MONITORING, ED - Abnormal; Notable for the following:    Glucose-Capillary 120 (*)    All other components within normal limits  CBC  TROPONIN I  TROPONIN I   Imaging Review Dg Chest 2 View  10/14/2013   CLINICAL DATA:  Left-sided chest pain radiating to back, no shortness of breath  EXAM: CHEST  2 VIEW  COMPARISON:  DG CHEST 2 VIEW dated 10/11/2013; DG CHEST 2 VIEW dated 07/29/2013  FINDINGS: Examination is degraded due to patient body habitus.  Grossly unchanged cardiac silhouette and mediastinal contours given decreased lung  volumes. Veiling opacities overlying the bilateral lower lungs are favored to represent overlying breast and soft tissues. No discrete focal airspace opacities. No pleural effusion or pneumothorax. No evidence of edema. No acute osseus abnormalities.  IMPRESSION: Degraded examination without definite acute cardiopulmonary disease.   Electronically Signed   By: Sandi Mariscal M.D.   On: 10/14/2013 09:30   Ct Head Wo Contrast  10/14/2013   CLINICAL DATA:  Hypertension and headache  EXAM: CT HEAD WITHOUT CONTRAST  TECHNIQUE: Contiguous axial images were obtained from the base of the skull through the vertex without intravenous contrast.  COMPARISON:  Head CT 10/11/2013  FINDINGS: No acute intracranial abnormality is identified. Specifically, negative for hemorrhage, hydrocephalus, mass effect, mass lesion, or evidence of acute cortically based infarction. Skull is intact. Visualized paranasal sinuses and mastoid air cells are clear. Soft tissues of the scalp and orbits are symmetric.  IMPRESSION: No acute intracranial abnormality.   Electronically Signed   By: Curlene Dolphin M.D.   On: 10/14/2013 09:26   Mr Jodene Nam Head Wo Contrast  10/14/2013   CLINICAL DATA:  Persistent headache for 2-3 weeks. Diabetic hypertensive patient.  EXAM: MRA HEAD WITHOUT CONTRAST  TECHNIQUE: Angiographic images of the Circle of Willis were obtained using MRA technique without intravenous contrast.  COMPARISON:  10/14/2013 CT.  No comparison MR or CT angiogram.  FINDINGS: Hypoplastic A1 segment right anterior cerebral artery.  No medium or large size vessel significant stenosis or occlusion.  Ectatic vertebral arteries and basilar artery.  Tiny bulge superior margin of the M1 segment right middle cerebral artery and proximal right middle cerebral artery branch appear to be origin of the vessel rather than an aneurysm. Overall, no aneurysm or vascular malformation is noted.  Fetal type contribution to the posterior cerebral arteries.   IMPRESSION: Hypoplastic A1 segment right anterior cerebral artery.  No medium or large size vessel significant stenosis or occlusion.  Ectatic vertebral arteries and basilar artery.  Tiny bulge superior margin of the M1 segment right middle cerebral artery and proximal right middle cerebral artery branch appear to be origin of the vessel rather than an aneurysm. Overall, no aneurysm or vascular malformation is noted.   Electronically Signed   By: Chauncey Cruel M.D.   On: 10/14/2013 13:53  EKG Interpretation   Date/Time:  Sunday October 14 2013 08:13:33 EDT Ventricular Rate:  84 PR Interval:  157 QRS Duration: 90 QT Interval:  373 QTC Calculation: 441 R Axis:   14 Text Interpretation:  Sinus rhythm Anteroseptal infarct, old Baseline  wander in lead(s) II III aVL aVF No significant change since last tracing  11 October 2013 Confirmed by [facility]  MD-I, IVA ([id number]) on 10/14/2013 8:23:17  AM      MDM   Final diagnoses:  HTN (hypertension)  Headache    Cyndia Skeeters presents with HTN, headache and chest pain.  Pt with Hx of same and negative work-ups.  Pt was last seen on 10/11/13 for similar symptoms, but refused LP and left AMA after completion of the rest of the work-up and negative head CT.  No focal neurologic findings on exam.  Will obtain ECG, CXR, CT head, and bloodwork.  10:15 AM Pt without much improvement in headache, but resolved nausea.  Head CT without acute abnormality.   I personally reviewed the imaging tests through PACS system  I reviewed available ER/hospitalization records through the EMR  Pt with large UTI.  Will give Rocephin IV and migraine cocktail.  Discussed again with patient the possibility of SAH and recommended LP.  Pt refuses LP.  Will re-evaluate after additional medications.    11:32 AM Pt reports only some improvement in headache.  No emesis here in the department. Discussed with Dr. Rolland Porter who recommends MRA of the brain to r/o [facility], as pt is refusing  LP.  Pt remains without focal neurologic deficits and ambulates without gait disturbance or need for assistance.  2:14 PM MRA without evidence of aneyrysm, SAH or acute abnormality.  I personally reviewed the imaging tests through PACS system.  Pt with improved headache and remains without neurologic deficit.  Pt without further emesis and no recurrence of HTN here in the department.  ECG nonischemic and delta troponin remains negative.  No evidence of end organ damage.  Will d/c home with close PCP follow-up.  Pt is to return to the ED for worsening symptoms, visual changes, fevers, sudden onset headaches or other concerns.  BP 122/84  Pulse 71  Temp(Src) 97.9 F (36.6 C) (Oral)  Resp 18  Ht 5\' 6"  (1.676 m)  Wt 182 lb (82.555 kg)  BMI 29.39 kg/m2  SpO2 97%   Pt HA treated and improved while in ED.  Presentation is non concerning for Meningitis, or temporal arteritis. Pt is afebrile with no focal neuro deficits, nuchal rigidity, or change in vision. Pt is to follow up with PCP to discuss further treatment. Pt verbalizes understanding and is agreeable with plan to dc.   It has been determined that no acute conditions requiring further emergency intervention are present at this time. The patient/guardian have been advised of the diagnosis and plan. We have discussed signs and symptoms that warrant return to the ED, such as changes or worsening in symptoms.   Vital signs are stable at discharge.   BP 115/70  Pulse 72  Temp(Src) 97.9 F (36.6 C) (Oral)  Resp 16  Ht 5\' 6"  (1.676 m)  Wt 182 lb (82.555 kg)  BMI 29.39 kg/m2  SpO2 99%  Patient/guardian has voiced understanding and agreed to follow-up with the PCP or specialist.        Abigail Butts, PA-C 10/14/13 1441

## 2013-10-14 NOTE — Discharge Instructions (Signed)
1. Medications: usual home medications 2. Treatment: rest, drink plenty of fluids,  3. Follow Up: Please followup with your primary doctor for discussion of your diagnoses and further evaluation after today's visit; if you do not have a primary care doctor use the resource guide provided to find one;    Hypertension As your heart beats, it forces blood through your arteries. This force is your blood pressure. If the pressure is too high, it is called hypertension (HTN) or high blood pressure. HTN is dangerous because you may have it and not know it. High blood pressure may mean that your heart has to work harder to pump blood. Your arteries may be narrow or stiff. The extra work puts you at risk for heart disease, stroke, and other problems.  Blood pressure consists of two numbers, a higher number over a lower, 110/72, for example. It is stated as "110 over 72." The ideal is below 120 for the top number (systolic) and under 80 for the bottom (diastolic). Write down your blood pressure today. You should pay close attention to your blood pressure if you have certain conditions such as:  Heart failure.  Prior heart attack.  Diabetes  Chronic kidney disease.  Prior stroke.  Multiple risk factors for heart disease. To see if you have HTN, your blood pressure should be measured while you are seated with your arm held at the level of the heart. It should be measured at least twice. A one-time elevated blood pressure reading (especially in the Emergency Department) does not mean that you need treatment. There may be conditions in which the blood pressure is different between your right and left arms. It is important to see your caregiver soon for a recheck. Most people have essential hypertension which means that there is not a specific cause. This type of high blood pressure may be lowered by changing lifestyle factors such as:  Stress.  Smoking.  Lack of exercise.  Excessive  weight.  Drug/tobacco/alcohol use.  Eating less salt. Most people do not have symptoms from high blood pressure until it has caused damage to the body. Effective treatment can often prevent, delay or reduce that damage. TREATMENT  When a cause has been identified, treatment for high blood pressure is directed at the cause. There are a large number of medications to treat HTN. These fall into several categories, and your caregiver will help you select the medicines that are best for you. Medications may have side effects. You should review side effects with your caregiver. If your blood pressure stays high after you have made lifestyle changes or started on medicines,   Your medication(s) may need to be changed.  Other problems may need to be addressed.  Be certain you understand your prescriptions, and know how and when to take your medicine.  Be sure to follow up with your caregiver within the time frame advised (usually within two weeks) to have your blood pressure rechecked and to review your medications.  If you are taking more than one medicine to lower your blood pressure, make sure you know how and at what times they should be taken. Taking two medicines at the same time can result in blood pressure that is too low. SEEK IMMEDIATE MEDICAL CARE IF:  You develop a severe headache, blurred or changing vision, or confusion.  You have unusual weakness or numbness, or a faint feeling.  You have severe chest or abdominal pain, vomiting, or breathing problems. MAKE SURE YOU:   Understand  these instructions.  Will watch your condition.  Will get help right away if you are not doing well or get worse. Document Released: 06/21/2005 Document Revised: 09/13/2011 Document Reviewed: 02/09/2008 [facility] Patient Information 2014 [place].  Migraine Headache A migraine headache is an intense, throbbing pain on one or both sides of your head. A migraine can last for 30 minutes to  several hours. CAUSES  The exact cause of a migraine headache is not always known. However, a migraine may be caused when nerves in the brain become irritated and release chemicals that cause inflammation. This causes pain. Certain things may also trigger migraines, such as:  Alcohol.  Smoking.  Stress.  Menstruation.  Aged cheeses.  Foods or drinks that contain nitrates, glutamate, aspartame, or tyramine.  Lack of sleep.  Chocolate.  Caffeine.  Hunger.  Physical exertion.  Fatigue.  Medicines used to treat chest pain (nitroglycerine), birth control pills, estrogen, and some blood pressure medicines. SIGNS AND SYMPTOMS  Pain on one or both sides of your head.  Pulsating or throbbing pain.  Severe pain that prevents daily activities.  Pain that is aggravated by any physical activity.  Nausea, vomiting, or both.  Dizziness.  Pain with exposure to bright lights, loud noises, or activity.  General sensitivity to bright lights, loud noises, or smells. Before you get a migraine, you may get warning signs that a migraine is coming (aura). An aura may include:  Seeing flashing lights.  Seeing bright spots, halos, or zig-zag lines.  Having tunnel vision or blurred vision.  Having feelings of numbness or tingling.  Having trouble talking.  Having muscle weakness. DIAGNOSIS  A migraine headache is often diagnosed based on:  Symptoms.  Physical exam.  A CT scan or MRI of your head. These imaging tests cannot diagnose migraines, but they can help rule out other causes of headaches. TREATMENT Medicines may be given for pain and nausea. Medicines can also be given to help prevent recurrent migraines.  HOME CARE INSTRUCTIONS  Only take over-the-counter or prescription medicines for pain or discomfort as directed by your health care provider. The use of long-term narcotics is not recommended.  Lie down in a dark, quiet room when you have a migraine.  Keep a  journal to find out what may trigger your migraine headaches. For example, write down:  What you eat and drink.  How much sleep you get.  Any change to your diet or medicines.  Limit alcohol consumption.  Quit smoking if you smoke.  Get 7 9 hours of sleep, or as recommended by your health care provider.  Limit stress.  Keep lights dim if bright lights bother you and make your migraines worse. SEEK IMMEDIATE MEDICAL CARE IF:   Your migraine becomes severe.  You have a fever.  You have a stiff neck.  You have vision loss.  You have muscular weakness or loss of muscle control.  You start losing your balance or have trouble walking.  You feel faint or pass out.  You have severe symptoms that are different from your first symptoms. MAKE SURE YOU:   Understand these instructions.  Will watch your condition.  Will get help right away if you are not doing well or get worse. Document Released: 06/21/2005 Document Revised: 04/11/2013 Document Reviewed: 02/26/2013 [facility] Patient Information 2014 [place].

## 2013-10-14 NOTE — ED Notes (Signed)
Patient transported to radiology

## 2013-10-14 NOTE — ED Notes (Signed)
EKG given to EDP.  

## 2013-10-14 NOTE — ED Notes (Signed)
Onset today woke woke up with nausea with BP 209/114 and one emesis clear sputum. Headache pounding 10/10 alert answering and following commands appropriate. Chest pain left side 10/10 no radiating pain. Currently denies chest pain.

## 2013-10-18 ENCOUNTER — Other Ambulatory Visit: Payer: Self-pay | Admitting: *Deleted

## 2013-10-18 ENCOUNTER — Encounter: Payer: Self-pay | Admitting: Endocrinology

## 2013-10-18 ENCOUNTER — Ambulatory Visit (INDEPENDENT_AMBULATORY_CARE_PROVIDER_SITE_OTHER): Payer: Managed Care, Other (non HMO) | Admitting: Endocrinology

## 2013-10-18 VITALS — BP 122/78 | HR 94 | Temp 97.8°F | Resp 16 | Ht 60.0 in | Wt 184.0 lb

## 2013-10-18 DIAGNOSIS — E1140 Type 2 diabetes mellitus with diabetic neuropathy, unspecified: Secondary | ICD-10-CM

## 2013-10-18 DIAGNOSIS — IMO0002 Reserved for concepts with insufficient information to code with codable children: Secondary | ICD-10-CM

## 2013-10-18 DIAGNOSIS — E1165 Type 2 diabetes mellitus with hyperglycemia: Secondary | ICD-10-CM

## 2013-10-18 DIAGNOSIS — E785 Hyperlipidemia, unspecified: Secondary | ICD-10-CM

## 2013-10-18 DIAGNOSIS — E1149 Type 2 diabetes mellitus with other diabetic neurological complication: Secondary | ICD-10-CM

## 2013-10-18 DIAGNOSIS — E1142 Type 2 diabetes mellitus with diabetic polyneuropathy: Secondary | ICD-10-CM

## 2013-10-18 DIAGNOSIS — E049 Nontoxic goiter, unspecified: Secondary | ICD-10-CM

## 2013-10-18 LAB — GLUCOSE, POCT (MANUAL RESULT ENTRY): POC Glucose: 228 mg/dl — AB (ref 70–99)

## 2013-10-18 MED ORDER — GLUCOSE BLOOD VI STRP: ORAL_STRIP | Status: DC

## 2013-10-18 MED ORDER — INSULIN REGULAR HUMAN (CONC) 500 UNIT/ML ~~LOC~~ SOLN: SUBCUTANEOUS | Status: DC

## 2013-10-18 MED ORDER — CANAGLIFLOZIN 100 MG PO TABS: ORAL_TABLET | ORAL | Status: DC

## 2013-10-18 MED ORDER — ONETOUCH DELICA LANCETS FINE MISC
Status: DC
Start: 2013-10-18 — End: 2013-12-15

## 2013-10-18 MED ORDER — INSULIN GLARGINE 100 UNIT/ML ~~LOC~~ SOLN: 40.0000 [IU] | Freq: Every day | SUBCUTANEOUS | Status: DC

## 2013-10-18 NOTE — Patient Instructions (Addendum)
Calorie [employer] app for Carbs  Please check blood sugars before each meal and at least once a day  about 2 hours after any meal and daily on waking up. Please bring blood sugar monitor to each visit  NEW insulin, U-500 will replace the NovoLog and is to be taken 30 minutes before each meal The new insulin will be 10 units before breakfast, 8 units before lunch and 12 units before dinner. Adjust the supper time dose as described on the flow sheet  Take only 60 units of Lantus at bedtime once a day. You will start using the pen  Walk daily if possible  Invokana 100 mg before breakfast daily. This will remove fluid and you can stop the hydrochlorothiazide.

## 2013-10-18 NOTE — Progress Notes (Signed)
Patient ID: [name] [name], female   DOB: 01/30/1966, 48 y.o.   MRN: [id number]    Reason for Appointment : Consultation for Type 2 Diabetes  History of Present Illness          Diagnosis: Type 2 diabetes mellitus, date of diagnosis: 1998       Past history: Because of marked hyperglycemia at onset she was started on insulin at the time of diagnosis. She was on various insulin regimens and probably was taking premixed insulin for several years. She has been taking Lantus for about 18 months in increasing doses and also NovoLog insulin before meals She does not think her blood sugars have been in control at any time  Recent history: Her blood sugars have continue to be markedly increased since 2014 at least with A1c around 14% About 3 weeks ago her Lantus dose had been increased to 50 units twice a day However she says her fasting blood sugars are markedly increased and are about the highest of the day She is not unusually thirsty and is avoiding things with sugar She generally takes 20 units of NovoLog with meals       Oral hypoglycemic drugs the patient is taking are: None      Side effects from medications have been: Diarrhea from metformin INSULIN regimen is described as: Lantus 50 bid, NovoLog 20 units a.c.     Glucose monitoring:  done one time a day         Glucometer: True Result      Blood Glucose readings from recall: Am 300-340, lunchtime 240, suppertime 300, hs 200  Hypoglycemia: None      Glycemic control:  Lab Results  Component Value Date   HGBA1C 14.0 08/13/2013   HGBA1C 13.8* 05/09/2013   HGBA1C TEST NOT PERFORMED 03/27/2013   Lab Results  Component Value Date   MICROALBUR 4.54* 09/25/2007   LDLCALC 176* 03/27/2013   CREATININE 0.62 10/14/2013    Self-care: She is trying to portions controlled  Meals: 2-3 meals per day.  has protein at breakfast; will have fried food; going to fast food restaurants about twice a week, for snacks she will have crackers, yogurt and  apple     Exercise: no, limited by leg pains          Dietician visit: Most recent: 3/15.               Compliance with the medical regimen: Fair  Retinal exam: Most recent:1/15.    Weight history:160-180 Wt Readings from Last 3 Encounters:  10/18/13 184 lb (83.462 kg)  10/14/13 182 lb (82.555 kg)  10/11/13 182 lb (82.555 kg)      Medication List       This list is accurate as of: 10/18/13 11:11 AM.  Always use your most recent med list.               acetaminophen-codeine 300-30 MG per tablet  Commonly known as:  TYLENOL #3  Take 1 tablet by mouth every 4 (four) hours as needed.     cephALEXin 500 MG capsule  Commonly known as:  KEFLEX  Take 1 capsule (500 mg total) by mouth 4 (four) times daily.     fish oil-omega-3 fatty acids 1000 MG capsule  Take 2 capsules (2 g total) by mouth daily.     hydrochlorothiazide 12.5 MG tablet  Commonly known as:  HYDRODIURIL  Take 1 tablet (12.5 mg total) by mouth daily.  insulin aspart 100 UNIT/ML injection  Commonly known as:  novoLOG  Inject 20 Units into the skin 3 (three) times daily before meals.     insulin glargine 100 UNIT/ML injection  Commonly known as:  LANTUS  Inject 50 Units into the skin 2 (two) times daily.     losartan 100 MG tablet  Commonly known as:  COZAAR  Take 1 tablet (100 mg total) by mouth daily.     pregabalin 100 MG capsule  Commonly known as:  LYRICA  Take 1 capsule (100 mg total) by mouth 3 (three) times daily after meals.        Allergies: No Known Allergies  Past Medical History  Diagnosis Date  . Diabetes mellitus   . Hypertension     Past Surgical History  Procedure Laterality Date  . Abdominal hysterectomy    . Tubal ligation    . Tonsillectomy    . Cesarean section    . Ganglion cyst excision    . Cholecystectomy    . Eye surgery Left retinal    Family History  Problem Relation Age of Onset  . Diabetes Father   . Heart attack Father     Social History:  reports  that she has never smoked. She has never used smokeless tobacco. She reports that she does not drink alcohol or use illicit drugs.    Review of Systems       Lipids: She has a history of marked hypercholesterolemia but has not been treated with statin drugs and no recent levels available  Lab Results  Component Value Date   CHOL 257* 03/27/2013   HDL 57 03/27/2013   LDLCALC 176* 03/27/2013   TRIG 122 03/27/2013   CHOLHDL 4.5 03/27/2013         Occasionally may have headaches.                  Skin: No rash or infections     Thyroid:  No  unusual fatigue.     The blood pressure has been increased and she is on HCTZ, losartan for control      No swelling of feet.     No shortness of breath on exertion.     Bowel habits: Occasional nausea 3 weeks, no difficulties with early satiety or diarrhea       No frequency of urination or nocturia       No joint  Pains. Has back pain       No  depression          She has a history of Numbness, and burning in feet for about 1 year. Also may have sharp pains,  relieved by taking Lyrica for about 5 months   Physical Examination:  BP 122/78  Pulse 94  Temp(Src) 97.8 F (36.6 C)  Resp 16  Ht 5' (1.524 m)  Wt 184 lb (83.462 kg)  BMI 35.94 kg/m2  SpO2 97%  GENERAL:         Patient has generalized obesity.   HEENT:         Eye exam shows normal external appearance. Fundus exam shows no retinopathy. Oral exam shows normal mucosa .  NECK:         General:  Neck exam shows no lymphadenopathy. Carotids are normal to palpation and no bruit heard.  Thyroid is palpable about 1-1/2 times normal, soft and no nodules felt.   LUNGS:         Chest is symmetrical.  Lungs are clear to auscultation.Marland Kitchen   HEART:         Heart sounds:  S1 and S2 are normal. No murmurs or clicks heard, no S3 or S4.   ABDOMEN:         General:  There is no distention present. Liver and spleen are not palpable. No other mass or tenderness present.  EXTREMITIES:     There is  no edema. No skin lesions present.Marland Kitchen  NEUROLOGICAL:        Vibration sense is moderately reduced in toes. Ankle jerks are absent bilaterally.          Diabetic foot exam:  as in the foot exam section MUSCULOSKELETAL:       There is no enlargement or deformity of the joints. Spine is normal to inspection.Marland Kitchen   PEDAL pulses: Normal SKIN:       No rash or lesions of concern.        ASSESSMENT:   Diabetes type 2, uncontrolled  She has significant obesity and marked insulin resistance. Currently having significant hyperglycemia despite taking 160 units of insulin a day. Currently not taking any insulin sensitizers and is intolerant to metformin She needs to be an adequate insulin to control her glucose and alternate forms of insulin or an insulin pump maybe more effective Although her diet is reasonably good she has difficulty exercising and is tending to gain weight She has not had inadequate diabetes education also   Complications: Neuropathy   Hyperlipidemia: Currently not on any treatment except fish oil, LDL last year was 176,   Small goiter on exam, TSH normal in 2014   Hypertension, well controlled  PLAN:    Change NovoLog to U 500 insulin before each meal. Discussed onset of action, duration of action, dosage equivalent in terms of U 100 insulin, actual measurement on the insulin syringe, dosage. She will start with 10 units before breakfast, 8 units before lunch and 12 units before dinner. Given written instructions and brochure on the medication as well as co-pay card  To titrate suppertime dose by 2 units every 3 days to bring morning sugar down below 150  Reduce Lantus to 60 units at bedtime only and stop the morning dose. She will be prescribed a SoloSTAR pen  To start Invokana 100 mg before breakfast. Discussed in detail the mechanism of action, benefits, effects on glucose, weight loss, fluid status and blood pressure effects as well as possible side effects  Stop HCTZ  to prevent drop in blood pressure and volume depletion  Verio glucose monitor was prescribed and she will start using this to measure blood sugar at least 3-4 times a day including after meals initially to help adjust insulin. She will bring this for download also for better evaluation of her blood sugar patterns  Gradually start walking and increase exercise as tolerated  To start watching carbohydrates, fat intake and calories with the use of the Smartphone Calorie [employer]. Reduce fried food  Given information on insulin pump but she will need basic diabetes education before looking into this. Brochures given  Check urine microalbumin when blood sugar is better controlled  Total visit time including counseling = 60 minutes  Kaitelyn Jamison 10/18/2013, 11:11 AM

## 2013-10-20 MED ORDER — "INSULIN SYRINGE-NEEDLE U-100 31G X 5/16"" 0.3 ML MISC": Status: DC

## 2013-10-20 MED ORDER — INSULIN REGULAR HUMAN (CONC) 500 UNIT/ML ~~LOC~~ SOLN
10.0000 [IU] | Freq: Three times a day (TID) | SUBCUTANEOUS | Status: DC
Start: ? — End: 2013-11-01

## 2013-10-23 ENCOUNTER — Encounter: Payer: Self-pay | Admitting: Internal Medicine

## 2013-11-01 ENCOUNTER — Ambulatory Visit (INDEPENDENT_AMBULATORY_CARE_PROVIDER_SITE_OTHER): Payer: Managed Care, Other (non HMO) | Admitting: Endocrinology

## 2013-11-01 ENCOUNTER — Encounter: Payer: Self-pay | Admitting: Endocrinology

## 2013-11-01 VITALS — BP 122/78 | HR 82 | Temp 97.9°F | Resp 16 | Ht 60.0 in | Wt 183.2 lb

## 2013-11-01 DIAGNOSIS — IMO0001 Reserved for inherently not codable concepts without codable children: Secondary | ICD-10-CM

## 2013-11-01 DIAGNOSIS — E782 Mixed hyperlipidemia: Secondary | ICD-10-CM

## 2013-11-01 DIAGNOSIS — E1165 Type 2 diabetes mellitus with hyperglycemia: Principal | ICD-10-CM

## 2013-11-01 LAB — GLUCOSE, POCT (MANUAL RESULT ENTRY): POC Glucose: 462 mg/dl — AB (ref 70–99)

## 2013-11-01 MED ORDER — DAPAGLIFLOZIN PROPANEDIOL 10 MG PO TABS: 10.0000 mg | ORAL_TABLET | Freq: Every day | ORAL | Status: DC

## 2013-11-01 NOTE — Patient Instructions (Signed)
U-500 insulin 14 units 1st meal and 16 before 2nd meal  LANTUS 70 IN AM ON waking up AND 50 IN PM

## 2013-11-01 NOTE — Progress Notes (Signed)
Patient ID: [name] [name], female   DOB: May 25, 1966, 48 y.o.   MRN: [id number]    Reason for Appointment : Consultation for Type 2 Diabetes  History of Present Illness          Diagnosis: Type 2 diabetes mellitus, date of diagnosis: 1998       Past history: Because of marked hyperglycemia at onset she was started on insulin at the time of diagnosis. She was on various insulin regimens and probably was taking premixed insulin for several years. She has been taking Lantus for about 18 months in increasing doses and also NovoLog insulin before meals She does not think her blood sugars have been in control at any time Her blood sugars have been persistently increased since 2014 at least with A1c around 14%  Recent history:  About 2 weeks ago she was started on U-500 insulin instead of NovoLog Her Lantus dose had been continued at 50 units twice a day, usually taking this about 12 hours apart She was also started on Invokana but she took only the sample and not clear why she did not get the prescription filled, apparently it was too expensive She thinks blood sugars were significantly better initially with starting the above regimen and Invokana but she has not had any strips for the last week and not clear what her sugars are She is compliant with taking her U-500 insulin but she is eating only 2 meals a day Blood sugar is markedly increased today before breakfast       Oral hypoglycemic drugs the patient is taking are: None      Side effects from medications have been: Diarrhea from metformin INSULIN regimen is described as: Lantus 50 bid noon and 11 pm, U-500 10 units before lunch and 12 units    Glucose monitoring:  done one time a day         Glucometer:  One Touch Verio    Blood Glucose readings from download: Fasting: 128, 214, 288 Evening 287-435  Hypoglycemia: None      Glycemic control:  Lab Results  Component Value Date   HGBA1C 14.0 08/13/2013   HGBA1C 13.8* 05/09/2013    HGBA1C TEST NOT PERFORMED 03/27/2013   Lab Results  Component Value Date   MICROALBUR 4.54* 09/25/2007   LDLCALC 176* 03/27/2013   CREATININE 0.62 10/14/2013    Self-care: She is trying to portions controlled  Meals: 2-3 meals per day.  has protein at breakfast; will have fried food; going to fast food restaurants about twice a week, for snacks she will have crackers, yogurt and apple     Exercise: no, limited by leg pains          Dietician visit: Most recent: 3/15.               Compliance with the medical regimen: Fair  Retinal exam: Most recent:1/15.    Weight history:160-180 Wt Readings from Last 3 Encounters:  11/01/13 183 lb 3.2 oz (83.099 kg)  10/18/13 184 lb (83.462 kg)  10/14/13 182 lb (82.555 kg)      Medication List       This list is accurate as of: 11/01/13 11:13 AM.  Always use your most recent med list.               acetaminophen-codeine 300-30 MG per tablet  Commonly known as:  TYLENOL #3  Take 1 tablet by mouth every 4 (four) hours as needed.     Canagliflozin  100 MG Tabs  Commonly known as:  INVOKANA  Take 1 tablet daily     cephALEXin 500 MG capsule  Commonly known as:  KEFLEX  Take 1 capsule (500 mg total) by mouth 4 (four) times daily.     fish oil-omega-3 fatty acids 1000 MG capsule  Take 2 capsules (2 g total) by mouth daily.     glucose blood test strip  Commonly known as:  ONETOUCH VERIO  Use as instructed to check blood sugar 4 times per day dx code 250.02     hydrochlorothiazide 12.5 MG tablet  Commonly known as:  HYDRODIURIL  Take 1 tablet (12.5 mg total) by mouth daily.     insulin aspart 100 UNIT/ML injection  Commonly known as:  novoLOG  Inject 20 Units into the skin 3 (three) times daily before meals.     insulin glargine 100 UNIT/ML injection  Commonly known as:  LANTUS  Inject 50 Units into the skin 2 (two) times daily.     insulin regular human CONCENTRATED 500 UNIT/ML Soln injection  Commonly known as:  HUMULIN R   Inject 10 units at breakfast, 8 units at lunch and 12 units at supper, take 30 before each meal     Insulin Syringe-Needle U-100 31G X 5/16" 0.3 ML Misc  Commonly known as:  B-D INSULIN SYRINGE  To use with U-500 insulin 3 times a day     losartan 100 MG tablet  Commonly known as:  COZAAR  Take 1 tablet (100 mg total) by mouth daily.     ONETOUCH DELICA LANCETS FINE Misc  Use to check blood sugar 4 times per day     pregabalin 100 MG capsule  Commonly known as:  LYRICA  Take 1 capsule (100 mg total) by mouth 3 (three) times daily after meals.        Allergies: No Known Allergies  Past Medical History  Diagnosis Date  . Diabetes mellitus   . Hypertension     Past Surgical History  Procedure Laterality Date  . Abdominal hysterectomy    . Tubal ligation    . Tonsillectomy    . Cesarean section    . Ganglion cyst excision    . Cholecystectomy    . Eye surgery Left retinal    Family History  Problem Relation Age of Onset  . Diabetes Father   . Heart attack Father     Social History:  reports that she has never smoked. She has never used smokeless tobacco. She reports that she does not drink alcohol or use illicit drugs.    Review of Systems       Lipids: She has a history of marked hypercholesterolemia but has not been treated with statin drugs and no recent levels available  Lab Results  Component Value Date   CHOL 257* 03/27/2013   HDL 57 03/27/2013   LDLCALC 176* 03/27/2013   TRIG 122 03/27/2013   CHOLHDL 4.5 03/27/2013  .     The blood pressure has been increased and she is on  losartan; HCTZ was stopped when she tried Invokana         She has a history of Numbness, and burning in feet for about 1 year. Also may have sharp pains,  relieved by taking Lyrica   Physical Examination:  BP 122/78  Pulse 82  Temp(Src) 97.9 F (36.6 C)  Resp 16  Ht 5' (1.524 m)  Wt 183 lb 3.2 oz (83.099 kg)  BMI 35.78  kg/m2  SpO2 94%    ASSESSMENT/PLAN:   Diabetes  type 2, uncontrolled  She has not responded to current regimen of insulin even with starting U-500 before meals She has significant obesity and marked insulin resistance. Since she had responded to a trial of Invokana she can try to fill this or even try [place] which appears to be better covered by insurance Also will increase her morning Lantus to 70 units as well as U-500 insulin to 14 units before breakfast and 16 before supper; eating only 2 meals a day Although her diet is reasonably good she has difficulty exercising and is tending to gain weight; may respond to [place] She will be referred for diabetes education also Needs to restart checking blood sugars  Hyperlipidemia: Most likely will need a statin drug and will check lipids on the next visit  Hypertension: Blood pressure is controlled even without HCTZ, continue losartan alone  Drayton Tieu 11/01/2013, 11:13 AM

## 2013-11-07 ENCOUNTER — Encounter: Payer: Managed Care, Other (non HMO) | Attending: Endocrinology | Admitting: Nutrition

## 2013-11-07 ENCOUNTER — Telehealth: Payer: Self-pay | Admitting: Endocrinology

## 2013-11-07 DIAGNOSIS — E119 Type 2 diabetes mellitus without complications: Secondary | ICD-10-CM | POA: Insufficient documentation

## 2013-11-07 DIAGNOSIS — Z713 Dietary counseling and surveillance: Secondary | ICD-10-CM | POA: Insufficient documentation

## 2013-11-07 DIAGNOSIS — R109 Unspecified abdominal pain: Secondary | ICD-10-CM | POA: Insufficient documentation

## 2013-11-07 NOTE — Telephone Encounter (Signed)
Message copied by Ocie Doyne on Wed Nov 07, 2013  1:37 PM ------      Message from: Elayne Snare      Created: Wed Nov 07, 2013  1:23 PM      Regarding: RE: upper left quadrant pain       She needs to see PCP asap      ----- Message -----         From: Ocie Doyne         Sent: 11/07/2013  11:03 AM           To: Elayne Snare, MD      Subject: upper left quadrant pain                                 Pt. Is here in my office today and is complaining of upper right quadrant pain that is rotating to her back.  Not related to eating times.  Pain is sharp and starts at the sternum and rotates to her upper back.  She had her gallbladder removed, and she is concerned that she is having pancreatitis.  ??       ------

## 2013-11-07 NOTE — Telephone Encounter (Signed)
Talked with patient and told her to call her primary care MD ASAP.  She said she would do this.

## 2013-11-12 ENCOUNTER — Other Ambulatory Visit: Payer: Self-pay | Admitting: *Deleted

## 2013-11-12 DIAGNOSIS — M549 Dorsalgia, unspecified: Secondary | ICD-10-CM

## 2013-11-12 DIAGNOSIS — E1140 Type 2 diabetes mellitus with diabetic neuropathy, unspecified: Secondary | ICD-10-CM

## 2013-11-12 MED ORDER — PREGABALIN 100 MG PO CAPS: 100.0000 mg | ORAL_CAPSULE | Freq: Three times a day (TID) | ORAL | Status: DC

## 2013-11-12 NOTE — Progress Notes (Signed)
This patient is here today to review her diet and blood sugar readings.  She did not bring her meter, but says that her blood sugars are doing " better".  She did not test this AM, but says they were close to 100 yesterday.  Is testing before supper as well, and they are "a little higher, around 140-150".   Diet:  She is usually eating only 2 meals, and most meals are  high in fat.  She was shown that she is eating a lot of fat, and she agreed, and agreed to reduce the amounts of salad dressing, oil in cooking veg., fried foods, and adding butter to breads and mayo to sandwiches. Leaner choices of fast food meals were suggested to her.   I explained that this is also raising her blood sugars, and she reported good understanding of this.  We talked about how to balance meals and what foods fall into which groups, and how much to each of each food group.  She reported good understanding of this.  She is snacking between meals some, and was encouraged to stop this, but that if she felt she needed a snack, to make sure it was less than 150 calories.  Suggestions were given for this.  Medications:  Lantus:  70u AM, 50u PM.  She reports always taking this, and never forgetting this.                         R-U500:  14u with first meal, and 16u with second meal.  She was encouraged to wait at least 30 min. After taking this before eating her meal.    Exercise:  None.  Encouraged her to walk for 10 min. 3-4 times per day, before starting work, by parking father away, at lunch, and when she gets home.  She agreed to try this.    SBGM:  Encouraged her to test her blood sugars before each meal at 3 times/wk at bedtime.  She agreed to do this.  Pt. Complaining of abdominal pain radiating to her back, almost daily.  Note to Dr. Dwyane Dee for this.

## 2013-11-12 NOTE — Patient Instructions (Signed)
Walk for 10 min. 3X/day Limit the amount of fat added to veg. , frying, mayo, and salad dressings to salads.   Test blood sugars more often--before meals and at bedtime

## 2013-11-21 ENCOUNTER — Emergency Department (INDEPENDENT_AMBULATORY_CARE_PROVIDER_SITE_OTHER)
Admission: EM | Admit: 2013-11-21 | Discharge: 2013-11-21 | Disposition: A | Discharge status: Home / Self Care | Payer: Managed Care, Other (non HMO) | Source: Home / Self Care

## 2013-11-21 ENCOUNTER — Encounter (HOSPITAL_COMMUNITY): Payer: Self-pay | Admitting: Emergency Medicine

## 2013-11-21 DIAGNOSIS — R12 Heartburn: Secondary | ICD-10-CM

## 2013-11-21 DIAGNOSIS — R1013 Epigastric pain: Secondary | ICD-10-CM

## 2013-11-21 LAB — LIPASE, BLOOD: Lipase: 16 U/L (ref 11–59)

## 2013-11-21 LAB — CBC
HCT: 39.2 % (ref 36.0–46.0)
Hemoglobin: 12.8 g/dL (ref 12.0–15.0)
MCH: 26.9 pg (ref 26.0–34.0)
MCHC: 32.7 g/dL (ref 30.0–36.0)
MCV: 82.5 fL (ref 78.0–100.0)
Platelets: 195 10*3/uL (ref 150–400)
RBC: 4.75 MIL/uL (ref 3.87–5.11)
RDW: 14.2 % (ref 11.5–15.5)
WBC: 7.5 10*3/uL (ref 4.0–10.5)

## 2013-11-21 LAB — COMPREHENSIVE METABOLIC PANEL
ALT: 12 U/L (ref 0–35)
AST: 13 U/L (ref 0–37)
Albumin: 3.2 g/dL — ABNORMAL LOW (ref 3.5–5.2)
Alkaline Phosphatase: 99 U/L (ref 39–117)
BUN: 12 mg/dL (ref 6–23)
CO2: 27 mEq/L (ref 19–32)
Calcium: 8.5 mg/dL (ref 8.4–10.5)
Chloride: 103 mEq/L (ref 96–112)
Creatinine, Ser: 0.71 mg/dL (ref 0.50–1.10)
GFR calc Af Amer: >90 mL/min (ref >90)
GFR calc non Af Amer: >90 mL/min (ref >90)
Glucose, Bld: 133 mg/dL — ABNORMAL HIGH (ref 70–99)
Potassium: 3.7 mEq/L (ref 3.7–5.3)
Sodium: 140 mEq/L (ref 137–147)
Total Bilirubin: <0.2 mg/dL — ABNORMAL LOW (ref 0.3–1.2)
Total Protein: 7.1 g/dL (ref 6.0–8.3)

## 2013-11-21 MED ORDER — PANTOPRAZOLE SODIUM 40 MG PO TBEC: 40.0000 mg | DELAYED_RELEASE_TABLET | Freq: Every day | ORAL | Status: DC

## 2013-11-21 MED ORDER — TRAMADOL HCL 50 MG PO TABS: 50.0000 mg | ORAL_TABLET | Freq: Four times a day (QID) | ORAL | Status: DC | PRN

## 2013-11-21 NOTE — ED Provider Notes (Signed)
CSN: [id number]     Arrival date & time 11/21/13  1246 History   None    Chief Complaint  Patient presents with  . Abdominal Pain   (Consider location/radiation/quality/duration/timing/severity/associated sxs/prior Treatment) HPI Abdominal pain: started 2 mo ago. Off and on but becoming more constant. Getting worse. Worse w/ meals. Upper stomach to lower chest and radiates to back. Burning nature. Has tried tylenol 3 w/o relief. Tums w/o relief. Comes on worse when lying down. Denies ETOH or tobacco use. Denies CP, SOb, palpitations, syncope, nausea, vomiting, constipation.    Cholecystectomy several years ago  Past Medical History  Diagnosis Date  . Diabetes mellitus   . Hypertension    Past Surgical History  Procedure Laterality Date  . Abdominal hysterectomy    . Tubal ligation    . Tonsillectomy    . Cesarean section    . Ganglion cyst excision    . Eye surgery Left retinal  . Cholecystectomy     Family History  Problem Relation Age of Onset  . Diabetes Father   . Heart attack Father    History  Substance Use Topics  . Smoking status: Never Smoker   . Smokeless tobacco: Never Used  . Alcohol Use: No   OB History   Grav Para Term Preterm Abortions TAB SAB Ect Mult Living                 Review of Systems  Constitutional: Negative for fever and activity change.  Respiratory: Negative for shortness of breath.   Cardiovascular: Negative for chest pain, palpitations and leg swelling.    Allergies  Review of patient's allergies indicates no known allergies.  Home Medications   Prior to Admission medications   Medication Sig Start Date End Date Taking? Authorizing Provider  acetaminophen-codeine (TYLENOL #3) 300-30 MG per tablet Take 1 tablet by mouth every 4 (four) hours as needed. 10/12/13   Angelica Chessman, MD  Canagliflozin (INVOKANA) 100 MG TABS Take 1 tablet daily 10/18/13   Elayne Snare, MD  cephALEXin (KEFLEX) 500 MG capsule Take 1 capsule (500 mg total)  by mouth 4 (four) times daily. 10/11/13   Ezequiel Essex, MD  Dapagliflozin Propanediol (FARXIGA) 10 MG TABS Take 10 mg by mouth daily before breakfast. 11/01/13   Elayne Snare, MD  fish oil-omega-3 fatty acids 1000 MG capsule Take 2 capsules (2 g total) by mouth daily. 01/09/13   Robbie Lis, MD  glucose blood (ONETOUCH VERIO) test strip Use as instructed to check blood sugar 4 times per day dx code 250.02 10/18/13   Elayne Snare, MD  hydrochlorothiazide (HYDRODIURIL) 12.5 MG tablet Take 1 tablet (12.5 mg total) by mouth daily. 10/12/13   Angelica Chessman, MD  insulin aspart (NOVOLOG) 100 UNIT/ML injection Inject 20 Units into the skin 3 (three) times daily before meals.    Historical Provider, MD  insulin glargine (LANTUS) 100 UNIT/ML injection Inject 50 Units into the skin 2 (two) times daily. 10/18/13   Elayne Snare, MD  insulin regular human CONCENTRATED (HUMULIN R) 500 UNIT/ML SOLN injection Inject 10 units at breakfast, 8 units at lunch and 12 units at supper, take 30 before each meal 10/18/13   Elayne Snare, MD  Insulin Syringe-Needle U-100 (B-D INSULIN SYRINGE) 31G X 5/16" 0.3 ML MISC To use with U-500 insulin 3 times a day 10/20/13   Elayne Snare, MD  losartan (COZAAR) 100 MG tablet Take 1 tablet (100 mg total) by mouth daily. 10/04/13   Reyne Dumas, MD  ONETOUCH DELICA LANCETS FINE MISC Use to check blood sugar 4 times per day 10/18/13   Elayne Snare, MD  pregabalin (LYRICA) 100 MG capsule Take 1 capsule (100 mg total) by mouth 3 (three) times daily after meals. 11/12/13   Angelica Chessman, MD   BP 138/91  Pulse 76  Temp(Src) 99 F (37.2 C) (Oral)  Resp 18  SpO2 97% Physical Exam  Constitutional: She is oriented to person, place, and time. She appears well-developed. No distress.  HENT:  Head: Normocephalic and atraumatic.  Eyes: EOM are normal. Pupils are equal, round, and reactive to light.  Neck: Normal range of motion. Neck supple. No thyromegaly present.  Cardiovascular: Normal rate and intact  distal pulses.   Murmur (soft II/VI murmur) heard. Pulmonary/Chest: Effort normal and breath sounds normal. No respiratory distress. She has no wheezes. She has no rales. She exhibits no tenderness.  Abdominal: Soft. Bowel sounds are normal. She exhibits no distension and no mass. There is no tenderness. There is no rebound and no guarding.  Musculoskeletal: Normal range of motion. She exhibits no edema and no tenderness.  Neurological: She is alert and oriented to person, place, and time.  Skin: Skin is warm and dry. No rash noted. She is not diaphoretic. No erythema.  Psychiatric: She has a normal mood and affect. Her behavior is normal. Judgment and thought content normal.    ED Course  Procedures (including critical care time) Labs Review Labs Reviewed  CBC  COMPREHENSIVE METABOLIC PANEL  LIPASE, BLOOD  H. PYLORI ANTIBODY, IGG    Imaging Review No results found.   MDM   1. Epigastric pain   2. Heart burn    Abd pain likely from Reflux that may be compounded by H. Pylori. May also have a hiatal hernia. Cannot r/o pancreatitis (unlikely given cholecystectomy and no ETOH), DM gastroporesis (but not presenting in typical fashion), unlikely constipation or MSK pain. Non-cardiac in nature. - CBC (to evaluate in case of blood loss from ulceration) - H pylori - CMET - lipase - Start Protonix 40mg  daily - start tramadol prn pain (NSAIDs not an option )  Linna Darner, MD Family Medicine PGY-3 11/21/2013, 3:35 PM      Waldemar Dickens, MD 11/21/13 1535

## 2013-11-21 NOTE — ED Notes (Signed)
Pt  Reports  Symptoms  Of  Epigastric  Pain  Radiating  To l  Side   Which is  Worse  After eating    She  denys  Any  Vomiting  /  denys  Any  Diarrhea      She  Reports  Some  Nausea

## 2013-11-21 NOTE — Discharge Instructions (Signed)
THe cause of your abdominal pain is not clear. It is likely related to severe reflux that may be made worse by an HPylori infection or a hiatal hernia. THis may also be from pancreatitis or what wwe call musculoskeletal pain There is also a remote chance that this could be related to diabetic gastroporesis.  Please start the protonix and avoid trigger foods Please take the tramadol for pain but watch out for constipation Please follow up with your regular doctor

## 2013-11-22 LAB — H. PYLORI ANTIBODY, IGG: H Pylori IgG: 3.93 {ISR} — ABNORMAL HIGH

## 2013-11-22 NOTE — ED Provider Notes (Signed)
Medical screening examination/treatment/procedure(s) were performed by a resident physician or non-physician practitioner and as the supervising physician I was immediately available for consultation/collaboration.  Lynne Leader, MD    Gregor Hams, MD 11/22/13 [phone]

## 2013-11-22 NOTE — ED Notes (Addendum)
H.Pylori 3.93 H.  Treated with Protonix.  Lab shown to Dr. Georgina Snell.  He said he would call Dr. Marily Memos to treat pt. [name] [name] 11/22/2013 Dr. Marily Memos notified pt. of result and added Amoxicillin, Biaxin and Carafate on 5/23.   11/25/2013

## 2013-11-23 ENCOUNTER — Encounter: Payer: Self-pay | Admitting: Internal Medicine

## 2013-11-24 ENCOUNTER — Telehealth: Payer: Self-pay | Admitting: Family Medicine

## 2013-11-24 MED ORDER — AMOXICILLIN 500 MG PO CAPS: 1000.0000 mg | ORAL_CAPSULE | Freq: Two times a day (BID) | ORAL | Status: DC

## 2013-11-24 MED ORDER — SUCRALFATE 1 GM/10ML PO SUSP: 1.0000 g | Freq: Three times a day (TID) | ORAL | Status: DC

## 2013-11-24 MED ORDER — CLARITHROMYCIN 500 MG PO TABS: 500.0000 mg | ORAL_TABLET | Freq: Two times a day (BID) | ORAL | Status: DC

## 2013-11-24 NOTE — Telephone Encounter (Signed)
Called and spoke to pt regarding Hp;ylori test Will treat w/ amox 1g BID x14 days and clarithro 500mg  BID x 14 days. Continue PPI Avoid trigger foods that cause pain F/u PRN Pt aware of plan and amenable.   Linna Darner, MD Family Medicine PGY-3 11/24/2013, 4:52 PM

## 2013-11-24 NOTE — Telephone Encounter (Signed)
Message copied by Waldemar Dickens on Sat Nov 24, 2013  4:51 PM ------      Message from: Roselyn Meier      Created: Wed Nov 21, 2013 11:18 PM      Regarding: labs                   ----- Message -----         From: Lab In [place]: 11/21/2013   3:58 PM           To: [facility] Follow Up             ------

## 2013-11-28 ENCOUNTER — Telehealth: Payer: Self-pay | Admitting: Internal Medicine

## 2013-11-28 ENCOUNTER — Other Ambulatory Visit (INDEPENDENT_AMBULATORY_CARE_PROVIDER_SITE_OTHER): Payer: Managed Care, Other (non HMO)

## 2013-11-28 ENCOUNTER — Ambulatory Visit (INDEPENDENT_AMBULATORY_CARE_PROVIDER_SITE_OTHER): Payer: Managed Care, Other (non HMO) | Admitting: Endocrinology

## 2013-11-28 ENCOUNTER — Encounter: Payer: Self-pay | Admitting: Endocrinology

## 2013-11-28 VITALS — BP 124/80 | HR 80 | Temp 98.1°F | Resp 14 | Ht 60.0 in | Wt 186.2 lb

## 2013-11-28 DIAGNOSIS — E1165 Type 2 diabetes mellitus with hyperglycemia: Principal | ICD-10-CM

## 2013-11-28 DIAGNOSIS — IMO0001 Reserved for inherently not codable concepts without codable children: Secondary | ICD-10-CM

## 2013-11-28 DIAGNOSIS — E782 Mixed hyperlipidemia: Secondary | ICD-10-CM

## 2013-11-28 DIAGNOSIS — IMO0002 Reserved for concepts with insufficient information to code with codable children: Secondary | ICD-10-CM

## 2013-11-28 DIAGNOSIS — M5410 Radiculopathy, site unspecified: Secondary | ICD-10-CM

## 2013-11-28 LAB — LIPID PANEL
Cholesterol: 232 mg/dL — ABNORMAL HIGH (ref 0–200)
HDL: 53.2 mg/dL (ref >39.00)
LDL Cholesterol: 151 mg/dL — ABNORMAL HIGH (ref 0–99)
Total CHOL/HDL Ratio: 4
Triglycerides: 137 mg/dL (ref 0.0–149.0)
VLDL: 27.4 mg/dL (ref 0.0–40.0)

## 2013-11-28 LAB — BASIC METABOLIC PANEL
BUN: 10 mg/dL (ref 6–23)
CO2: 25 mEq/L (ref 19–32)
Calcium: 8.8 mg/dL (ref 8.4–10.5)
Chloride: 104 mEq/L (ref 96–112)
Creatinine, Ser: 0.8 mg/dL (ref 0.4–1.2)
GFR: 106.36 mL/min (ref >60.00)
Glucose, Bld: 193 mg/dL — ABNORMAL HIGH (ref 70–99)
Potassium: 3.6 mEq/L (ref 3.5–5.1)
Sodium: 138 mEq/L (ref 135–145)

## 2013-11-28 MED ORDER — ONDANSETRON HCL 4 MG PO TABS: 4.0000 mg | ORAL_TABLET | Freq: Three times a day (TID) | ORAL | Status: DC | PRN

## 2013-11-28 MED ORDER — DULOXETINE HCL 30 MG PO CPEP: 30.0000 mg | ORAL_CAPSULE | Freq: Every day | ORAL | Status: DC

## 2013-11-28 NOTE — Progress Notes (Signed)
Patient ID: [name] [name], female   DOB: Nov 09, 1965, 48 y.o.   MRN: [id number]    Reason for Appointment : Left-sided pain  History of Present Illness   1. She is asking about her left-sided chest pain which has been going on for 2 months. She says the pain is in the lower part of a chest in the middle and radiates around to the side towards the back. This has been a sharp pain and is fairly constant and severe. However she thinks the pain has been worsening. Sometimes it catches her breath also. She was evaluated in the urgent care Center and was felt to have either gastritis or peptic ulcer and was given treatment for H. pylori along with Protonix. Her H. pylori test was positive but also her lipase was normal However she has not had any relief of the pain. 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 Also does not think that Ultram helps her pain         2. Diagnosis: Type 2 diabetes mellitus, date of diagnosis: 1998      Past history: Because of marked hyperglycemia at onset she was started on insulin at the time of diagnosis. She was on various insulin regimens and probably was taking premixed insulin for several years. She has been taking Lantus for about 18 months in increasing doses and also NovoLog insulin before meals She does not think her blood sugars have been in control at any time Her blood sugars have been persistently increased since 2014 at least with A1c around 14%  Recent history:  About 6 weeks ago she was started on U-500 insulin instead of NovoLog Because of continued poor control she was given Invokana also about 4 weeks ago She thinks her blood sugars are much better now and recently at times low normal She has on her reduced her Lantus significantly and is taking this according to her blood sugar on a daily basis, does  not know how much he is usually taking but took only 7 units last night       Oral hypoglycemic drugs the patient is taking are: Invokana 100mg      Side effects from medications have been: Diarrhea from metformin INSULIN regimen is described as: Lantus 7-15  bid noon and 11 pm, U-500 10 units before lunch and 12 units    Glucose monitoring:  done one time a day         Glucometer:  One Touch Verio    Blood Glucose readings from recall:150-200, lowest 96 last night with symptoms of hypoglycemia Did not bring monitor today      Glycemic control:  Lab Results  Component Value Date   HGBA1C 14.0 08/13/2013   HGBA1C 13.8* 05/09/2013   HGBA1C TEST NOT PERFORMED 03/27/2013   Lab Results  Component Value Date   MICROALBUR 4.54* 09/25/2007   LDLCALC 176* 03/27/2013   CREATININE 0.71 11/21/2013    Self-care: She is trying to portions controlled  Meals: 2-3 meals per day.  has protein at breakfast; will have fried food; going to fast food restaurants about twice a week, for snacks she will have crackers, yogurt and apple     Exercise: no, limited by leg pains          Dietician visit: Most recent: 3/15.  Compliance with the medical regimen: Fair  Retinal exam: Most recent:1/15.    Weight history:160-180 Wt Readings from Last 3 Encounters:  11/28/13 186 lb 3.2 oz (84.46 kg)  11/01/13 183 lb 3.2 oz (83.099 kg)  10/18/13 184 lb (83.462 kg)      Medication List       This list is accurate as of: 11/28/13 10:07 AM.  Always use your most recent med list.               acetaminophen-codeine 300-30 MG per tablet  Commonly known as:  TYLENOL #3  Take 1 tablet by mouth every 4 (four) hours as needed.     amoxicillin 500 MG capsule  Commonly known as:  AMOXIL  Take 2 capsules (1,000 mg total) by mouth 2 (two) times daily.     Canagliflozin 100 MG Tabs  Commonly known as:  INVOKANA  Take 1 tablet daily     cephALEXin 500 MG capsule  Commonly known as:  KEFLEX  Take 1  capsule (500 mg total) by mouth 4 (four) times daily.     clarithromycin 500 MG tablet  Commonly known as:  BIAXIN  Take 1 tablet (500 mg total) by mouth 2 (two) times daily.     fish oil-omega-3 fatty acids 1000 MG capsule  Take 2 capsules (2 g total) by mouth daily.     glucose blood test strip  Commonly known as:  ONETOUCH VERIO  Use as instructed to check blood sugar 4 times per day dx code 250.02     insulin aspart 100 UNIT/ML injection  Commonly known as:  novoLOG  Inject 20 Units into the skin 3 (three) times daily before meals.     insulin glargine 100 UNIT/ML injection  Commonly known as:  LANTUS  Inject 50 Units into the skin 2 (two) times daily.     insulin regular human CONCENTRATED 500 UNIT/ML Soln injection  Commonly known as:  HUMULIN R  Inject 10 units at breakfast, 8 units at lunch and 12 units at supper, take 30 before each meal     Insulin Syringe-Needle U-100 31G X 5/16" 0.3 ML Misc  Commonly known as:  B-D INSULIN SYRINGE  To use with U-500 insulin 3 times a day     losartan 100 MG tablet  Commonly known as:  COZAAR  Take 1 tablet (100 mg total) by mouth daily.     ONETOUCH DELICA LANCETS FINE Misc  Use to check blood sugar 4 times per day     pantoprazole 40 MG tablet  Commonly known as:  PROTONIX  Take 1 tablet (40 mg total) by mouth daily.     pregabalin 100 MG capsule  Commonly known as:  LYRICA  Take 1 capsule (100 mg total) by mouth 3 (three) times daily after meals.     sucralfate 1 GM/10ML suspension  Commonly known as:  CARAFATE  Take 10 mLs (1 g total) by mouth 4 (four) times daily -  with meals and at bedtime.     traMADol 50 MG tablet  Commonly known as:  ULTRAM  Take 1 tablet (50 mg total) by mouth every 6 (six) hours as needed.        Allergies: No Known Allergies  Past Medical History  Diagnosis Date  . Diabetes mellitus   . Hypertension     Past Surgical History  Procedure Laterality Date  . Abdominal hysterectomy     . Tubal ligation    . Tonsillectomy    .  Cesarean section    . Ganglion cyst excision    . Eye surgery Left retinal  . Cholecystectomy      Family History  Problem Relation Age of Onset  . Diabetes Father   . Heart attack Father     Social History:  reports that she has never smoked. She has never used smokeless tobacco. She reports that she does not drink alcohol or use illicit drugs.    Review of Systems   Pain 2 months      Lipids: She has a history of marked hypercholesterolemia but has not been treated with statin drugs and no recent levels available  Lab Results  Component Value Date   CHOL 257* 03/27/2013   HDL 57 03/27/2013   LDLCALC 176* 03/27/2013   TRIG 122 03/27/2013   CHOLHDL 4.5 03/27/2013  .     The blood pressure has been increased and she is on  losartan; HCTZ was stopped when she tried Invokana         She has a history of Numbness, and burning in feet for about 1 year. Also may have sharp pains,  relieved by taking Lyrica   Physical Examination:  BP 124/80  Pulse 80  Temp(Src) 98.1 F (36.7 C)  Resp 14  Ht 5' (1.524 m)  Wt 186 lb 3.2 oz (84.46 kg)  BMI 36.36 kg/m2  SpO2 94%   She appears to be in some distress She does not have any rash on her chest wall No abdominal tenderness, hepatosplenomegaly Has some tenderness on the left costochondral junctions in the lower part Lungs clear Heart sounds normal  ASSESSMENT/PLAN:   1. 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 However this is not definite, she does not appear to have abdominal symptoms or tenderness. Her nausea may be related to taking multiple antibiotics and Carafate Since she is already taking Lyrica 100 mg 3 times a day Will have her start taking Cymbalta also. However discussed that she will need to see her PCP for further evaluation and possibly referral to pain management. She can stop her antibiotics since she has not had any improvement in her symptoms and is probably  getting some side effects   Diabetes type 2, uncontrolled  She  had not responded to  a regimen of insulin even with starting U-500 before meals However with her taking Invokana her blood sugars appear to be better and she is taking much less insulin especially with not eating much the last few days  Will need to review her home blood sugars when she bends her monitor on the next visit Meanwhile can stop the Lantus and continue taking U-500 insulin twice a day  Hyperlipidemia: Most likely will need a statin drug and will check lipids on the next visit  Elayne Snare 11/28/2013, 10:07 AM

## 2013-11-28 NOTE — Patient Instructions (Addendum)
U-500, 10 units twice daily before meals  Stop antibiotics, take Lyrica 3x daily  [place] with food 1-2x daily

## 2013-11-30 ENCOUNTER — Telehealth: Payer: Self-pay | Admitting: *Deleted

## 2013-11-30 ENCOUNTER — Other Ambulatory Visit: Payer: Self-pay | Admitting: *Deleted

## 2013-11-30 LAB — FRUCTOSAMINE: Fructosamine: 406 umol/L — ABNORMAL HIGH (ref 190–270)

## 2013-11-30 MED ORDER — ATORVASTATIN CALCIUM 10 MG PO TABS: 10.0000 mg | ORAL_TABLET | Freq: Every day | ORAL | Status: DC

## 2013-11-30 NOTE — Telephone Encounter (Signed)
Patient called stating that she is having pain in her upper abdominal area that goes to her shoulder blade. Patient states she is having some nausea. History of gall bladder removal.  Patient has been on pain medication and acid reflux medication patient states this is not helping and would like to be referred to a GI doctor. Alverda Skeans, RN

## 2013-12-03 ENCOUNTER — Telehealth: Payer: Self-pay | Admitting: *Deleted

## 2013-12-03 NOTE — Telephone Encounter (Signed)
p 

## 2013-12-03 NOTE — Telephone Encounter (Signed)
Patient called today in regards to needing a GI referral. Please follow up with patient. Alverda Skeans, RN

## 2013-12-05 ENCOUNTER — Ambulatory Visit: Payer: Managed Care, Other (non HMO) | Admitting: Endocrinology

## 2013-12-05 DIAGNOSIS — Z0289 Encounter for other administrative examinations: Secondary | ICD-10-CM

## 2013-12-06 ENCOUNTER — Encounter: Payer: Self-pay | Admitting: Internal Medicine

## 2013-12-06 ENCOUNTER — Ambulatory Visit: Payer: Managed Care, Other (non HMO) | Attending: Internal Medicine | Admitting: Internal Medicine

## 2013-12-06 VITALS — BP 163/92 | HR 76 | Temp 99.0°F | Resp 14 | Ht 60.0 in | Wt 183.8 lb

## 2013-12-06 DIAGNOSIS — K2960 Other gastritis without bleeding: Secondary | ICD-10-CM | POA: Insufficient documentation

## 2013-12-06 DIAGNOSIS — K2940 Chronic atrophic gastritis without bleeding: Secondary | ICD-10-CM

## 2013-12-06 DIAGNOSIS — K2970 Gastritis, unspecified, without bleeding: Secondary | ICD-10-CM

## 2013-12-06 DIAGNOSIS — R7309 Other abnormal glucose: Secondary | ICD-10-CM

## 2013-12-06 DIAGNOSIS — E119 Type 2 diabetes mellitus without complications: Secondary | ICD-10-CM

## 2013-12-06 DIAGNOSIS — A048 Other specified bacterial intestinal infections: Secondary | ICD-10-CM

## 2013-12-06 DIAGNOSIS — I10 Essential (primary) hypertension: Secondary | ICD-10-CM | POA: Insufficient documentation

## 2013-12-06 DIAGNOSIS — R739 Hyperglycemia, unspecified: Secondary | ICD-10-CM

## 2013-12-06 DIAGNOSIS — B9681 Helicobacter pylori [H. pylori] as the cause of diseases classified elsewhere: Secondary | ICD-10-CM | POA: Insufficient documentation

## 2013-12-06 DIAGNOSIS — M549 Dorsalgia, unspecified: Secondary | ICD-10-CM

## 2013-12-06 DIAGNOSIS — Z794 Long term (current) use of insulin: Secondary | ICD-10-CM | POA: Insufficient documentation

## 2013-12-06 LAB — GLUCOSE, POCT (MANUAL RESULT ENTRY)
POC Glucose: 433 mg/dl — AB (ref 70–99)
POC Glucose: 411 mg/dl — AB (ref 70–99)

## 2013-12-06 MED ORDER — INSULIN ASPART 100 UNIT/ML ~~LOC~~ SOLN
20.0000 [IU] | Freq: Once | SUBCUTANEOUS | Status: AC
  Administered 2013-12-06: 20 [IU] via SUBCUTANEOUS

## 2013-12-06 NOTE — Patient Instructions (Signed)

## 2013-12-06 NOTE — Progress Notes (Signed)
Patient ID: [name] [name], female   DOB: 28-Aug-1965, 48 y.o.   MRN: [id number]   Phillina Malinowski, is a 48 y.o. female  [id number]  [id number]  DOB - February 12, 1966  Chief Complaint  Patient presents with  . Breast Pain        Subjective:   [name] [name] is a 48 y.o. female here today for a follow up visit. Patient is known to have hypertension and diabetes, recently diagnosed to have H. pylori gastritis, treated with triple antibiotics but patient continue to have symptoms. She has been advised if symptoms persist she'll be referred to gastroenterologist, she is here for that referral. Had diabetes is currently being managed by endocrinologist because blood sugar has been out of control, her blood pressure is controlled the patient on current medications she also said her blood sugar has been within reasonable range at home. Her pain is mostly epigastric and the left hypochondrium radiating to the back. She does not know significant relieving or aggravating factors.  Patient has No headache, No chest pain, No abdominal pain - No Nausea, No new weakness tingling or numbness, No Cough - SOB.  Problem  Helicobacter Positive Gastritis    ALLERGIES: No Known Allergies  PAST MEDICAL HISTORY: Past Medical History  Diagnosis Date  . Diabetes mellitus   . Hypertension     MEDICATIONS AT HOME: Prior to Admission medications   Medication Sig Start Date End Date Taking? Authorizing Provider  acetaminophen-codeine (TYLENOL #3) 300-30 MG per tablet Take 1 tablet by mouth every 4 (four) hours as needed. 10/12/13  Yes Angelica Chessman, MD  amoxicillin (AMOXIL) 500 MG capsule Take 2 capsules (1,000 mg total) by mouth 2 (two) times daily. 11/24/13  Yes Waldemar Dickens, MD  Canagliflozin [facility]) 100 MG TABS Take 1 tablet daily 10/18/13  Yes Elayne Snare, MD  clarithromycin (BIAXIN) 500 MG tablet Take 1 tablet (500 mg total) by mouth 2 (two) times daily. 11/24/13  Yes Waldemar Dickens, MD  DULoxetine  (CYMBALTA) 30 MG capsule Take 1 capsule (30 mg total) by mouth daily. 11/28/13  Yes Elayne Snare, MD  fish oil-omega-3 fatty acids 1000 MG capsule Take 2 capsules (2 g total) by mouth daily. 01/09/13  Yes Robbie Lis, MD  glucose blood (ONETOUCH VERIO) test strip Use as instructed to check blood sugar 4 times per day dx code 250.02 10/18/13  Yes Elayne Snare, MD  insulin aspart (NOVOLOG) 100 UNIT/ML injection Inject 20 Units into the skin 3 (three) times daily before meals.   Yes Historical Provider, MD  insulin glargine (LANTUS) 100 UNIT/ML injection Inject 50 Units into the skin 2 (two) times daily. 10/18/13  Yes Elayne Snare, MD  insulin regular human CONCENTRATED (HUMULIN R) 500 UNIT/ML SOLN injection Inject 10 units at breakfast, 8 units at lunch and 12 units at supper, take 30 before each meal 10/18/13  Yes Elayne Snare, MD  losartan (COZAAR) 100 MG tablet Take 1 tablet (100 mg total) by mouth daily. 10/04/13  Yes Reyne Dumas, MD  ONETOUCH DELICA LANCETS FINE MISC Use to check blood sugar 4 times per day 10/18/13  Yes Elayne Snare, MD  pantoprazole (PROTONIX) 40 MG tablet Take 1 tablet (40 mg total) by mouth daily. 11/21/13  Yes Waldemar Dickens, MD  pregabalin (LYRICA) 100 MG capsule Take 1 capsule (100 mg total) by mouth 3 (three) times daily after meals. 11/12/13  Yes Angelica Chessman, MD  sucralfate (CARAFATE) 1 GM/10ML suspension Take 10 mLs (1 g total) by  mouth 4 (four) times daily -  with meals and at bedtime. 11/24/13  Yes Waldemar Dickens, MD  traMADol (ULTRAM) 50 MG tablet Take 1 tablet (50 mg total) by mouth every 6 (six) hours as needed. 11/21/13  Yes Waldemar Dickens, MD  atorvastatin (LIPITOR) 10 MG tablet Take 1 tablet (10 mg total) by mouth daily. 11/30/13   Elayne Snare, MD  Insulin Syringe-Needle U-100 (B-D INSULIN SYRINGE) 31G X 5/16" 0.3 ML MISC To use with U-500 insulin 3 times a day 10/20/13   Elayne Snare, MD  ondansetron (ZOFRAN) 4 MG tablet Take 1 tablet (4 mg total) by mouth every 8 (eight) hours  as needed for nausea or vomiting. 11/28/13   Elayne Snare, MD     Objective:   Filed Vitals:   12/06/13 1042  BP: 163/92  Pulse: 76  Temp: 99 F (37.2 C)  TempSrc: Oral  Resp: 14  Height: 5' (1.524 m)  Weight: 183 lb 12.8 oz (83.371 kg)  SpO2: 99%    Exam General appearance : Awake, alert, not in any distress. Speech Clear. Not toxic looking, obese HEENT: Atraumatic and Normocephalic, pupils equally reactive to light and accomodation Neck: supple, no JVD. No cervical lymphadenopathy.  Chest:Good air entry bilaterally, no added sounds  CVS: S1 S2 regular, no murmurs.  Abdomen: Bowel sounds present, Non tender and not distended with no gaurding, rigidity or rebound. Extremities: B/L Lower Ext shows no edema, both legs are warm to touch Neurology: Awake alert, and oriented X 3, CN II-XII intact, Non focal Skin:No Rash Wounds:N/A  Data Review Lab Results  Component Value Date   HGBA1C 14.0 08/13/2013   HGBA1C 13.8* 05/09/2013   HGBA1C TEST NOT PERFORMED 03/27/2013     Assessment & Plan   1. Diabetes  - Glucose (CBG) - insulin aspart (novoLOG) injection 20 Units; Inject 0.2 mLs (20 Units total) into the skin once. Continue present regimen as ordered by endocrinologist  2. Back pain Continue present pain medication  3. Helicobacter positive gastritis  - Ambulatory referral to Gastroenterology for possible upper endoscopy  Patient was counseled extensively about nutrition and exercise  Return in about 3 months (around 03/08/2014), or if symptoms worsen or fail to improve, for Abdominal Pain.  The patient was given clear instructions to go to ER or return to medical center if symptoms don't improve, worsen or new problems develop. The patient verbalized understanding. The patient was told to call to get lab results if they haven't heard anything in the next week.   This note has been created with Surveyor, quantity. Any  transcriptional errors are unintentional.    Angelica Chessman, MD, [place], [place], [place] and [facility] [place], [place]   12/06/2013, 11:22 AM

## 2013-12-06 NOTE — Progress Notes (Signed)
Patient here today stating she has pain under the breast bone for 2 months on and off but it is now constant. Patient is positive for H. Pylori.

## 2013-12-15 ENCOUNTER — Encounter (HOSPITAL_COMMUNITY): Payer: Self-pay | Admitting: Emergency Medicine

## 2013-12-15 ENCOUNTER — Emergency Department (HOSPITAL_COMMUNITY): Payer: Managed Care, Other (non HMO)

## 2013-12-15 ENCOUNTER — Emergency Department (HOSPITAL_COMMUNITY)
Admission: EM | Admit: 2013-12-15 | Discharge: 2013-12-15 | Disposition: A | Discharge status: Home / Self Care | Payer: Managed Care, Other (non HMO) | Attending: Emergency Medicine | Admitting: Emergency Medicine

## 2013-12-15 DIAGNOSIS — R110 Nausea: Secondary | ICD-10-CM

## 2013-12-15 DIAGNOSIS — R05 Cough: Secondary | ICD-10-CM | POA: Insufficient documentation

## 2013-12-15 DIAGNOSIS — R011 Cardiac murmur, unspecified: Secondary | ICD-10-CM | POA: Insufficient documentation

## 2013-12-15 DIAGNOSIS — E119 Type 2 diabetes mellitus without complications: Secondary | ICD-10-CM | POA: Insufficient documentation

## 2013-12-15 DIAGNOSIS — R112 Nausea with vomiting, unspecified: Secondary | ICD-10-CM | POA: Insufficient documentation

## 2013-12-15 DIAGNOSIS — R079 Chest pain, unspecified: Secondary | ICD-10-CM | POA: Insufficient documentation

## 2013-12-15 DIAGNOSIS — R197 Diarrhea, unspecified: Secondary | ICD-10-CM | POA: Insufficient documentation

## 2013-12-15 DIAGNOSIS — Z79899 Other long term (current) drug therapy: Secondary | ICD-10-CM | POA: Insufficient documentation

## 2013-12-15 DIAGNOSIS — I10 Essential (primary) hypertension: Secondary | ICD-10-CM | POA: Insufficient documentation

## 2013-12-15 DIAGNOSIS — R059 Cough, unspecified: Secondary | ICD-10-CM | POA: Insufficient documentation

## 2013-12-15 DIAGNOSIS — Z792 Long term (current) use of antibiotics: Secondary | ICD-10-CM | POA: Insufficient documentation

## 2013-12-15 LAB — I-STAT TROPONIN, ED
Troponin i, poc: 0 ng/mL (ref 0.00–0.08)
Troponin i, poc: 0 ng/mL (ref 0.00–0.08)

## 2013-12-15 LAB — URINALYSIS, ROUTINE W REFLEX MICROSCOPIC
Bilirubin Urine: NEGATIVE
Glucose, UA: >1000 mg/dL — AB
Hgb urine dipstick: NEGATIVE
Ketones, ur: NEGATIVE mg/dL
Leukocytes, UA: NEGATIVE
Nitrite: NEGATIVE
Protein, ur: 30 mg/dL — AB
Specific Gravity, Urine: 1.036 — ABNORMAL HIGH (ref 1.005–1.030)
Urobilinogen, UA: 0.2 mg/dL (ref 0.0–1.0)
pH: 6.5 (ref 5.0–8.0)

## 2013-12-15 LAB — BASIC METABOLIC PANEL
BUN: 13 mg/dL (ref 6–23)
CO2: 28 mEq/L (ref 19–32)
Calcium: 9.8 mg/dL (ref 8.4–10.5)
Chloride: 94 mEq/L — ABNORMAL LOW (ref 96–112)
Creatinine, Ser: 0.71 mg/dL (ref 0.50–1.10)
GFR calc Af Amer: >90 mL/min (ref >90)
GFR calc non Af Amer: >90 mL/min (ref >90)
Glucose, Bld: 506 mg/dL — ABNORMAL HIGH (ref 70–99)
Potassium: 4.4 mEq/L (ref 3.7–5.3)
Sodium: 136 mEq/L — ABNORMAL LOW (ref 137–147)

## 2013-12-15 LAB — CBC
HCT: 41.5 % (ref 36.0–46.0)
Hemoglobin: 13.8 g/dL (ref 12.0–15.0)
MCH: 27 pg (ref 26.0–34.0)
MCHC: 33.3 g/dL (ref 30.0–36.0)
MCV: 81.2 fL (ref 78.0–100.0)
Platelets: 198 10*3/uL (ref 150–400)
RBC: 5.11 MIL/uL (ref 3.87–5.11)
RDW: 14 % (ref 11.5–15.5)
WBC: 7.3 10*3/uL (ref 4.0–10.5)

## 2013-12-15 LAB — CBG MONITORING, ED: Glucose-Capillary: 325 mg/dL — ABNORMAL HIGH (ref 70–99)

## 2013-12-15 LAB — URINE MICROSCOPIC-ADD ON

## 2013-12-15 LAB — LIPASE, BLOOD: Lipase: 21 U/L (ref 11–59)

## 2013-12-15 MED ORDER — SUCRALFATE 1 GM/10ML PO SUSP: 1.0000 g | Freq: Three times a day (TID) | ORAL | Status: DC

## 2013-12-15 MED ORDER — HYDROCODONE-ACETAMINOPHEN 5-325 MG PO TABS: 1.0000 | ORAL_TABLET | ORAL | Status: DC | PRN

## 2013-12-15 MED ORDER — FAMOTIDINE IN NACL 20-0.9 MG/50ML-% IV SOLN
20.0000 mg | Freq: Once | INTRAVENOUS | Status: AC
  Administered 2013-12-15: 20 mg via INTRAVENOUS
  Filled 2013-12-15: qty 50

## 2013-12-15 MED ORDER — ONDANSETRON HCL 4 MG/2ML IJ SOLN
4.0000 mg | Freq: Once | INTRAMUSCULAR | Status: AC
  Administered 2013-12-15: 4 mg via INTRAVENOUS
  Filled 2013-12-15: qty 2

## 2013-12-15 MED ORDER — PANTOPRAZOLE SODIUM 40 MG IV SOLR
40.0000 mg | Freq: Once | INTRAVENOUS | Status: AC
  Administered 2013-12-15: 40 mg via INTRAVENOUS
  Filled 2013-12-15: qty 40

## 2013-12-15 MED ORDER — PANTOPRAZOLE SODIUM 20 MG PO TBEC: 20.0000 mg | DELAYED_RELEASE_TABLET | Freq: Every day | ORAL | Status: DC

## 2013-12-15 MED ORDER — MORPHINE SULFATE 4 MG/ML IJ SOLN
4.0000 mg | Freq: Once | INTRAMUSCULAR | Status: AC
  Administered 2013-12-15: 4 mg via INTRAVENOUS
  Filled 2013-12-15: qty 1

## 2013-12-15 MED ORDER — SUCRALFATE 1 GM/10ML PO SUSP
1.0000 g | Freq: Three times a day (TID) | ORAL | Status: DC
  Administered 2013-12-15: 1 g via ORAL
  Filled 2013-12-15 (×2): qty 10

## 2013-12-15 MED ORDER — SODIUM CHLORIDE 0.9 % IV BOLUS (SEPSIS)
1000.0000 mL | Freq: Once | INTRAVENOUS | Status: AC
  Administered 2013-12-15: 1000 mL via INTRAVENOUS

## 2013-12-15 MED ORDER — INSULIN ASPART 100 UNIT/ML ~~LOC~~ SOLN
10.0000 [IU] | Freq: Once | SUBCUTANEOUS | Status: AC
  Administered 2013-12-15: 10 [IU] via SUBCUTANEOUS
  Filled 2013-12-15: qty 1

## 2013-12-15 MED ORDER — ONDANSETRON HCL 4 MG/2ML IJ SOLN
4.0000 mg | Freq: Once | INTRAMUSCULAR | Status: DC
  Filled 2013-12-15: qty 2

## 2013-12-15 MED ORDER — GI COCKTAIL ~~LOC~~
30.0000 mL | Freq: Once | ORAL | Status: AC
  Administered 2013-12-15: 30 mL via ORAL
  Filled 2013-12-15: qty 30

## 2013-12-15 NOTE — ED Notes (Signed)
Patient transported to X-ray 

## 2013-12-15 NOTE — Discharge Instructions (Signed)
Your laboratory testing and EKGs of your heart have not shown any signs for a concerning or emergent cause of your pain and symptoms. At this time your providers feel that your symptoms are likely related to your stomach. Please followup with your GI specialist for continued evaluation and treatment. Use the medications prescribed to help with your symptoms. Return at any time for changing or worsening symptoms.    Chest Pain (Nonspecific) Chest pain has many causes. Your pain could be caused by something serious, such as a heart attack or a blood clot in the lungs. It could also be caused by something less serious, such as a chest bruise or a virus. Follow up with your doctor. More lab tests or other studies may be needed to find the cause of your pain. Most of the time, nonspecific chest pain will improve within 2 to 3 days of rest and mild pain medicine. HOME CARE  For chest bruises, you may put ice on the sore area for 15-20 minutes, 03-04 times a day. Do this only if it makes you feel better.  Put ice in a plastic bag.  Place a towel between the skin and the bag.  Rest for the next 2 to 3 days.  Go back to work if the pain improves.  See your doctor if the pain lasts longer than 1 to 2 weeks.  Only take medicine as told by your doctor.  Quit smoking if you smoke. GET HELP RIGHT AWAY IF:   There is more pain or pain that spreads to the arm, neck, jaw, back, or belly (abdomen).  You have shortness of breath.  You cough more than usual or cough up blood.  You have very bad back or belly pain, feel sick to your stomach (nauseous), or throw up (vomit).  You have very bad weakness.  You pass out (faint).  You have a fever. Any of these problems may be serious and may be an emergency. Do not wait to see if the problems will go away. Get medical help right away. Call your local emergency services 911 in [place] Do not drive yourself to the hospital. MAKE SURE YOU:   Understand  these instructions.  Will watch this condition.  Will get help right away if you or your child is not doing well or gets worse. Document Released: 12/08/2007 Document Revised: 09/13/2011 Document Reviewed: 12/08/2007 [facility] Patient Information 2014 [place], [place].  Gastritis, Adult Gastritis is soreness and puffiness (inflammation) of the lining of the stomach. If you do not get help, gastritis can cause bleeding and sores (ulcers) in the stomach. HOME CARE   Only take medicine as told by your doctor.  If you were given antibiotic medicines, take them as told. Finish the medicines even if you start to feel better.  Drink enough fluids to keep your pee (urine) clear or pale yellow.  Avoid foods and drinks that make your problems worse. Foods you may want to avoid include:  Caffeine or alcohol.  Chocolate.  Mint.  Garlic and onions.  Spicy foods.  Citrus fruits, including oranges, lemons, or limes.  Food containing tomatoes, including sauce, chili, salsa, and pizza.  Fried and fatty foods.  Eat small meals throughout the day instead of large meals. GET HELP RIGHT AWAY IF:   You have black or dark red poop (stools).  You throw up (vomit) blood. It may look like coffee grounds.  You cannot keep fluids down.  Your belly (abdominal) pain gets worse.  You have a fever.  You do not feel better after 1 week.  You have any other questions or concerns. MAKE SURE YOU:   Understand these instructions.  Will watch your condition.  Will get help right away if you are not doing well or get worse. Document Released: 12/08/2007 Document Revised: 09/13/2011 Document Reviewed: 08/04/2011 [facility] Patient Information 2014 [place].

## 2013-12-15 NOTE — ED Notes (Signed)
Rhea to add on Lipase to blood already in lab.

## 2013-12-15 NOTE — ED Provider Notes (Signed)
Medical screening examination/treatment/procedure(s) were conducted as a shared visit with non-physician practitioner(s) or resident and myself. I personally evaluated the patient during the encounter and agree with the findings and plan unless otherwise indicated.  I have personally reviewed any xrays and/ or EKG's with the provider and I agree with interpretation.  Patient with history of intermittent lower sternal chest pain worse after eating different foods. Patient seen primary Dr. in gastroenterology. GI doctor placed her on steroids for possible inflammation however now her glucose in spite over 500 and she feels worse. Patient has not had an EGD at to look for ulcer or other cause of her symptoms. Patient does have 3 cardiac risk factors however this pain is worse with eating in very atypical for cardiac however we will do a delta troponin ordered. Fluid bolus given and insulin to improve her glucose and we discussed holding steroids and following up with GI again discussed possibility of EGD or other opinion. On exam lungs are clear, heart rate or rhythm, abdomen soft no focal tenderness. Plan for PPI  Chest pain   Mariea Clonts, MD 12/15/13 2053

## 2013-12-15 NOTE — ED Notes (Signed)
Report given to Kim, RN.

## 2013-12-15 NOTE — ED Provider Notes (Signed)
Jaquell Seddon S 8:00PM Pt discussed in sign out.  Pt with atypical chest pain that is somewhat chronic in nature. Does have hx of reflux.  Delta troponin pending if negative pt can be dc home with PPI and GI follow up.  Troponin is negative.  Pt still having some discomfort.  Small dose of morphine given and pt feeling better.  At this time she is ready to go home.   Martie Lee, PA-C 12/16/13 2213

## 2013-12-15 NOTE — ED Provider Notes (Signed)
CSN: [id number]     Arrival date & time 12/15/13  1802 History   First MD Initiated Contact with Patient 12/15/13 1828     Chief Complaint  Patient presents with  . Chest Pain   HPI Comments: Patient presents to the [facility] ED with 6 month history of intermittent chest pain x 6 months.  Patient states that she has had intermittent 8/10 substernal chest pain that radiates to the left scapula.  Patient states that the pain last for a few seconds and then goes away.  She states that her pain is aggravated by eating.  She has found no relieving factors at this time.  She has tried a Prev Pac for H. Pylori treatment and methylprednisolone which was give to her by Dr. Benson Norway.  Her chest pain is associated with nausea and vomiting in the past month.  She reports vomiting after meals every other day.  She also complains of loose stools, frequent cough, increased belching, and a sour taste in her mouth.  She reports that her pain is worse at night.    Patient is a 48 y.o. female presenting with chest pain. The history is provided by the patient. No language interpreter was used.  Chest Pain Associated symptoms: cough, fatigue, nausea and vomiting   Associated symptoms: no abdominal pain, no diaphoresis, no dysphagia, no fever, no palpitations and no shortness of breath     Past Medical History  Diagnosis Date  . Diabetes mellitus   . Hypertension    Past Surgical History  Procedure Laterality Date  . Abdominal hysterectomy    . Tubal ligation    . Tonsillectomy    . Cesarean section    . Ganglion cyst excision    . Eye surgery Left retinal  . Cholecystectomy     Family History  Problem Relation Age of Onset  . Diabetes Father   . Heart attack Father    History  Substance Use Topics  . Smoking status: Never Smoker   . Smokeless tobacco: Never Used  . Alcohol Use: No   OB History   Grav Para Term Preterm Abortions TAB SAB Ect Mult Living                 Review of Systems  Constitutional:  Positive for fatigue. Negative for fever, chills, diaphoresis, activity change and appetite change.  HENT: Negative for sore throat and trouble swallowing.   Respiratory: Positive for cough. Negative for choking, chest tightness and shortness of breath.   Cardiovascular: Positive for chest pain. Negative for palpitations and leg swelling.  Gastrointestinal: Positive for nausea, vomiting and diarrhea. Negative for abdominal pain, constipation and blood in stool.  Genitourinary: Negative for dysuria, urgency, frequency, hematuria, flank pain and difficulty urinating.  Skin: Negative for rash.  All other systems reviewed and are negative.     Allergies  Review of patient's allergies indicates no known allergies.  Home Medications   Prior to Admission medications   Medication Sig Start Date End Date Taking? Authorizing Provider  acetaminophen-codeine (TYLENOL #3) 300-30 MG per tablet Take 1 tablet by mouth every 4 (four) hours as needed. 10/12/13   Angelica Chessman, MD  amoxicillin (AMOXIL) 500 MG capsule Take 2 capsules (1,000 mg total) by mouth 2 (two) times daily. 11/24/13   Waldemar Dickens, MD  atorvastatin (LIPITOR) 10 MG tablet Take 1 tablet (10 mg total) by mouth daily. 11/30/13   Elayne Snare, MD  Canagliflozin (INVOKANA) 100 MG TABS Take 1 tablet daily  10/18/13   Elayne Snare, MD  clarithromycin (BIAXIN) 500 MG tablet Take 1 tablet (500 mg total) by mouth 2 (two) times daily. 11/24/13   Waldemar Dickens, MD  DULoxetine (CYMBALTA) 30 MG capsule Take 1 capsule (30 mg total) by mouth daily. 11/28/13   Elayne Snare, MD  fish oil-omega-3 fatty acids 1000 MG capsule Take 2 capsules (2 g total) by mouth daily. 01/09/13   Robbie Lis, MD  glucose blood (ONETOUCH VERIO) test strip Use as instructed to check blood sugar 4 times per day dx code 250.02 10/18/13   Elayne Snare, MD  insulin aspart (NOVOLOG) 100 UNIT/ML injection Inject 20 Units into the skin 3 (three) times daily before meals.    Historical  Provider, MD  insulin glargine (LANTUS) 100 UNIT/ML injection Inject 50 Units into the skin 2 (two) times daily. 10/18/13   Elayne Snare, MD  insulin regular human CONCENTRATED (HUMULIN R) 500 UNIT/ML SOLN injection Inject 10 units at breakfast, 8 units at lunch and 12 units at supper, take 30 before each meal 10/18/13   Elayne Snare, MD  Insulin Syringe-Needle U-100 (B-D INSULIN SYRINGE) 31G X 5/16" 0.3 ML MISC To use with U-500 insulin 3 times a day 10/20/13   Elayne Snare, MD  losartan (COZAAR) 100 MG tablet Take 1 tablet (100 mg total) by mouth daily. 10/04/13   Reyne Dumas, MD  ondansetron (ZOFRAN) 4 MG tablet Take 1 tablet (4 mg total) by mouth every 8 (eight) hours as needed for nausea or vomiting. 11/28/13   Elayne Snare, MD  [facility] DELICA LANCETS FINE MISC Use to check blood sugar 4 times per day 10/18/13   Elayne Snare, MD  pantoprazole (PROTONIX) 40 MG tablet Take 1 tablet (40 mg total) by mouth daily. 11/21/13   Waldemar Dickens, MD  pregabalin (LYRICA) 100 MG capsule Take 1 capsule (100 mg total) by mouth 3 (three) times daily after meals. 11/12/13   Angelica Chessman, MD  sucralfate (CARAFATE) 1 GM/10ML suspension Take 10 mLs (1 g total) by mouth 4 (four) times daily -  with meals and at bedtime. 11/24/13   Waldemar Dickens, MD  traMADol (ULTRAM) 50 MG tablet Take 1 tablet (50 mg total) by mouth every 6 (six) hours as needed. 11/21/13   Waldemar Dickens, MD   BP 163/88  Pulse 71  Temp(Src) 98.2 F (36.8 C) (Oral)  Resp 17  Ht 5' (1.524 m)  Wt 180 lb (81.647 kg)  BMI 35.15 kg/m2  SpO2 98% Physical Exam  Nursing note and vitals reviewed. Constitutional: She is oriented to person, place, and time. She appears well-developed and well-nourished. No distress.  HENT:  Head: Normocephalic and atraumatic.  Mouth/Throat: Oropharynx is clear and moist. No oropharyngeal exudate.  Eyes: Conjunctivae and EOM are normal. Pupils are equal, round, and reactive to light. No scleral icterus.  Neck: Normal range of  motion. Neck supple.  Cardiovascular: Normal rate, regular rhythm and intact distal pulses.   Murmur heard. Pulmonary/Chest: Effort normal and breath sounds normal. No respiratory distress. She has no wheezes. She has no rales. She exhibits no tenderness.  Abdominal: Soft. Bowel sounds are normal. She exhibits no distension and no mass. There is no tenderness. There is no rebound and no guarding.  Musculoskeletal: Normal range of motion.  Lymphadenopathy:    She has no cervical adenopathy.  Neurological: She is alert and oriented to person, place, and time. No cranial nerve deficit. Coordination normal.  Skin: Skin is warm and dry.  She is not diaphoretic.  Psychiatric: She has a normal mood and affect. Her behavior is normal. Judgment and thought content normal.    ED Course  Procedures (including critical care time) Labs Review Labs Reviewed  BASIC METABOLIC PANEL - Abnormal; Notable for the following:    Sodium 136 (*)    Chloride 94 (*)    Glucose, Bld 506 (*)    All other components within normal limits  CBC  LIPASE, BLOOD  URINALYSIS, ROUTINE W REFLEX MICROSCOPIC  I-STAT TROPOININ, ED    Imaging Review Dg Chest 2 View  12/15/2013   CLINICAL DATA:  Left-sided chest pain, history of hypertension, diabetes.  EXAM: CHEST  2 VIEW  COMPARISON:  10/14/2013  FINDINGS: The heart size and mediastinal contours are within normal limits. Both lungs are clear. The visualized skeletal structures are unremarkable.  IMPRESSION: No active cardiopulmonary disease.   Electronically Signed   By: Rolm Baptise M.D.   On: 12/15/2013 19:36     EKG Interpretation   Date/Time:  Saturday December 15 2013 18:07:57 EDT Ventricular Rate:  81 PR Interval:  150 QRS Duration: 78 QT Interval:  372 QTC Calculation: 432 R Axis:   42 Text Interpretation:  Normal sinus rhythm Right atrial enlargement Septal  infarct , age undetermined Abnormal ECG Similar to previous Confirmed by  ZAVITZ  MD, JOSHUA ([id number]) on  12/15/2013 7:34:23 PM      MDM   Final diagnoses:  Chest pain  Nausea   Patient presents to the [facility] ED with chest pain x 6 months.  Patient is PERC negative.  Patient was given full cardiac workup, with basic labs, UA, and CXR.  Cardiac workup is negative at this time.  Lipase, Delta troponin and UA are currently pending.  BMP shows elevation of glucose to 506 without anion gap.  Have given fluids and insulin here in the ED for management of blood glucose.  Most likely cause of blood glucose spike is due to methylprednisolone usage which the patient has been told to stop at this time.  Chest pain differential current includes GERD vs. PUD.  If lipase, UA, and delta troponins are negative at this time we will go ahead and discharge the patient home with a prescription for protonix and a referral back to Dr. Benson Norway at [facility].  Patient most likely needs an upper endoscopy given long lasting symptoms.  Patient will be signed out with Hazel Sams PA-C who will be assuming care.    Kenard Gower, PA-C 12/15/13 2036

## 2013-12-15 NOTE — ED Notes (Signed)
Pt reports sharp mid chest pains that started last night and radiates around to her back, having n/v but denies sob. ekg done, airway intact.

## 2013-12-18 ENCOUNTER — Other Ambulatory Visit: Payer: Self-pay | Admitting: Gastroenterology

## 2013-12-18 DIAGNOSIS — R109 Unspecified abdominal pain: Secondary | ICD-10-CM

## 2013-12-18 DIAGNOSIS — R112 Nausea with vomiting, unspecified: Secondary | ICD-10-CM

## 2013-12-18 NOTE — ED Provider Notes (Signed)
Medical screening examination/treatment/procedure(s) were performed by non-physician practitioner and as supervising physician I was immediately available for consultation/collaboration.   EKG Interpretation   Date/Time:  Saturday December 15 2013 21:24:55 EDT Ventricular Rate:  58 PR Interval:  154 QRS Duration: 88 QT Interval:  427 QTC Calculation: 419 R Axis:   50 Text Interpretation:  Sinus rhythm Low voltage, precordial leads Probable  anteroseptal infarct, old Minimal ST elevation, inferior leads ED  PHYSICIAN INTERPRETATION AVAILABLE IN [facility] Confirmed by TEST,  Record ([id number]) on 12/17/2013 12:20:08 PM        Blanchie Dessert, MD 12/18/13 [phone]

## 2013-12-28 ENCOUNTER — Encounter (HOSPITAL_COMMUNITY)
Admission: RE | Admit: 2013-12-28 | Discharge: 2013-12-28 | Disposition: A | Discharge status: Home / Self Care | Payer: Managed Care, Other (non HMO) | Source: Ambulatory Visit | Attending: Gastroenterology | Admitting: Gastroenterology

## 2013-12-28 DIAGNOSIS — R112 Nausea with vomiting, unspecified: Secondary | ICD-10-CM | POA: Insufficient documentation

## 2013-12-28 DIAGNOSIS — R109 Unspecified abdominal pain: Secondary | ICD-10-CM | POA: Insufficient documentation

## 2013-12-28 MED ORDER — TECHNETIUM TC 99M SULFUR COLLOID
2.0000 | Freq: Once | INTRAVENOUS | Status: AC | PRN
  Administered 2013-12-28: 2 via ORAL

## 2014-01-08 ENCOUNTER — Other Ambulatory Visit: Payer: Self-pay | Admitting: *Deleted

## 2014-01-08 MED ORDER — CANAGLIFLOZIN 100 MG PO TABS: ORAL_TABLET | ORAL | Status: DC

## 2014-01-09 ENCOUNTER — Encounter: Payer: Self-pay | Admitting: *Deleted

## 2014-01-09 NOTE — Progress Notes (Signed)
Form received from [place]. Form given to Dr. Doreene Burke for review and signature. Vivia Birmingham, RN

## 2014-01-11 ENCOUNTER — Ambulatory Visit (INDEPENDENT_AMBULATORY_CARE_PROVIDER_SITE_OTHER): Payer: Managed Care, Other (non HMO) | Admitting: Internal Medicine

## 2014-01-11 ENCOUNTER — Encounter: Payer: Self-pay | Admitting: Internal Medicine

## 2014-01-11 ENCOUNTER — Other Ambulatory Visit (INDEPENDENT_AMBULATORY_CARE_PROVIDER_SITE_OTHER): Payer: Managed Care, Other (non HMO)

## 2014-01-11 VITALS — BP 136/86 | HR 77 | Temp 98.1°F | Ht 60.0 in | Wt 181.5 lb

## 2014-01-11 DIAGNOSIS — Z0000 Encounter for general adult medical examination without abnormal findings: Secondary | ICD-10-CM

## 2014-01-11 DIAGNOSIS — E1165 Type 2 diabetes mellitus with hyperglycemia: Secondary | ICD-10-CM

## 2014-01-11 DIAGNOSIS — K219 Gastro-esophageal reflux disease without esophagitis: Secondary | ICD-10-CM

## 2014-01-11 DIAGNOSIS — IMO0001 Reserved for inherently not codable concepts without codable children: Secondary | ICD-10-CM

## 2014-01-11 DIAGNOSIS — IMO0002 Reserved for concepts with insufficient information to code with codable children: Secondary | ICD-10-CM

## 2014-01-11 DIAGNOSIS — Z0001 Encounter for general adult medical examination with abnormal findings: Secondary | ICD-10-CM | POA: Insufficient documentation

## 2014-01-11 DIAGNOSIS — K5900 Constipation, unspecified: Secondary | ICD-10-CM

## 2014-01-11 DIAGNOSIS — K5909 Other constipation: Secondary | ICD-10-CM

## 2014-01-11 LAB — URINALYSIS, ROUTINE W REFLEX MICROSCOPIC
Bilirubin Urine: NEGATIVE
Ketones, ur: NEGATIVE
Leukocytes, UA: NEGATIVE
Nitrite: NEGATIVE
Specific Gravity, Urine: <=1.005 — AB (ref 1.000–1.030)
Total Protein, Urine: NEGATIVE
Urine Glucose: >=1000 — AB
Urobilinogen, UA: 0.2 (ref 0.0–1.0)
pH: 6 (ref 5.0–8.0)

## 2014-01-11 LAB — CBC WITH DIFFERENTIAL/PLATELET
Basophils Absolute: 0.1 10*3/uL (ref 0.0–0.1)
Basophils Relative: 0.9 % (ref 0.0–3.0)
Eosinophils Absolute: 0.2 10*3/uL (ref 0.0–0.7)
Eosinophils Relative: 2.5 % (ref 0.0–5.0)
HCT: 41 % (ref 36.0–46.0)
Hemoglobin: 13.4 g/dL (ref 12.0–15.0)
Lymphocytes Relative: 37.2 % (ref 12.0–46.0)
Lymphs Abs: 2.4 10*3/uL (ref 0.7–4.0)
MCHC: 32.7 g/dL (ref 30.0–36.0)
MCV: 81.2 fl (ref 78.0–100.0)
Monocytes Absolute: 0.4 10*3/uL (ref 0.1–1.0)
Monocytes Relative: 6.1 % (ref 3.0–12.0)
Neutro Abs: 3.4 10*3/uL (ref 1.4–7.7)
Neutrophils Relative %: 53.3 % (ref 43.0–77.0)
Platelets: 188 10*3/uL (ref 150.0–400.0)
RBC: 5.05 Mil/uL (ref 3.87–5.11)
RDW: 14.6 % (ref 11.5–15.5)
WBC: 6.4 10*3/uL (ref 4.0–10.5)

## 2014-01-11 LAB — LIPID PANEL
Cholesterol: 291 mg/dL — ABNORMAL HIGH (ref 0–200)
HDL: 50.8 mg/dL (ref >39.00)
LDL Cholesterol: 184 mg/dL — ABNORMAL HIGH (ref 0–99)
NonHDL: 240.2
Total CHOL/HDL Ratio: 6
Triglycerides: 282 mg/dL — ABNORMAL HIGH (ref 0.0–149.0)
VLDL: 56.4 mg/dL — ABNORMAL HIGH (ref 0.0–40.0)

## 2014-01-11 LAB — HEPATIC FUNCTION PANEL
ALT: 13 U/L (ref 0–35)
AST: 20 U/L (ref 0–37)
Albumin: 3.6 g/dL (ref 3.5–5.2)
Alkaline Phosphatase: 117 U/L (ref 39–117)
Bilirubin, Direct: 0 mg/dL (ref 0.0–0.3)
Total Bilirubin: 0.6 mg/dL (ref 0.2–1.2)
Total Protein: 7.2 g/dL (ref 6.0–8.3)

## 2014-01-11 LAB — MICROALBUMIN / CREATININE URINE RATIO
Creatinine,U: 24.5 mg/dL
Microalb Creat Ratio: 33.4 mg/g — ABNORMAL HIGH (ref 0.0–30.0)
Microalb, Ur: 8.2 mg/dL — ABNORMAL HIGH (ref 0.0–1.9)

## 2014-01-11 LAB — BASIC METABOLIC PANEL
BUN: 12 mg/dL (ref 6–23)
CO2: 25 mEq/L (ref 19–32)
Calcium: 9.3 mg/dL (ref 8.4–10.5)
Chloride: 95 mEq/L — ABNORMAL LOW (ref 96–112)
Creatinine, Ser: 0.8 mg/dL (ref 0.4–1.2)
GFR: 103.13 mL/min (ref >60.00)
Glucose, Bld: 497 mg/dL — ABNORMAL HIGH (ref 70–99)
Potassium: 4.3 mEq/L (ref 3.5–5.1)
Sodium: 131 mEq/L — ABNORMAL LOW (ref 135–145)

## 2014-01-11 LAB — TSH: TSH: 1.1 u[IU]/mL (ref 0.35–4.50)

## 2014-01-11 LAB — HEMOGLOBIN A1C: Hgb A1c MFr Bld: 14 % — ABNORMAL HIGH (ref 4.6–6.5)

## 2014-01-11 MED ORDER — PANTOPRAZOLE SODIUM 40 MG PO TBEC: 40.0000 mg | DELAYED_RELEASE_TABLET | Freq: Every day | ORAL | Status: DC

## 2014-01-11 MED ORDER — LINACLOTIDE 290 MCG PO CAPS: 290.0000 ug | ORAL_CAPSULE | Freq: Every day | ORAL | Status: DC

## 2014-01-11 NOTE — Progress Notes (Signed)
Subjective:    Patient ID: [name] [name], female    DOB: 10/16/1965, 48 y.o.   MRN: [id number]  HPI  Here for wellness and f/u;  Overall doing ok;  Pt denies CP, worsening SOB, DOE, wheezing, orthopnea, PND, worsening LE edema, palpitations, dizziness or syncope.  Pt denies neurological change such as new headache, facial or extremity weakness.  Pt denies polydipsia, polyuria, or low sugar symptoms. Pt states overall good compliance with treatment and medications, good tolerability, and has been trying to follow lower cholesterol diet.  Pt denies worsening depressive symptoms, suicidal ideation or panic. No fever, night sweats, wt loss, loss of appetite, or other constitutional symptoms.  Pt states good ability with ADL's, has low fall risk, home safety reviewed and adequate, no other significant changes in hearing or vision, and only occasionally active with exercise   Also Still with abd pain, CT 2010 with constipation. N/V improved recently. BM x 2 this wk, usually 1 per wk with chronic constipation.  Due for laser surg right eye for retinopathy later this month. Has had mild worsening reflux, occ abd pain,  But no dysphagia, n/v, bowel change or blood.  Has appt later this month with Dr Allene Pyo.  No appt sched'd as of now with Dr Elon Alas for endo, has hard to control sugars Past Medical History  Diagnosis Date  . Diabetes mellitus   . Hypertension    Past Surgical History  Procedure Laterality Date  . Abdominal hysterectomy    . Tubal ligation    . Tonsillectomy    . Cesarean section    . Ganglion cyst excision    . Eye surgery Left retinal  . Cholecystectomy      reports that she has never smoked. She has never used smokeless tobacco. She reports that she does not drink alcohol or use illicit drugs. family history includes Diabetes in her father; Heart attack in her father. No Known Allergies Current Outpatient Prescriptions on File Prior to Visit  Medication Sig Dispense Refill  .  acetaminophen-codeine (TYLENOL #3) 300-30 MG per tablet Take 1 tablet by mouth every 4 (four) hours as needed.  60 tablet  0  . Canagliflozin (INVOKANA) 100 MG TABS Take 1 tablet daily  90 tablet  0  . HYDROcodone-acetaminophen (NORCO/VICODIN) 5-325 MG per tablet Take 1 tablet by mouth every 4 (four) hours as needed for moderate pain.  10 tablet  0  . insulin aspart (NOVOLOG) 100 UNIT/ML injection Inject 20 Units into the skin 3 (three) times daily before meals.      . insulin regular human CONCENTRATED (HUMULIN R) 500 UNIT/ML SOLN injection Inject 10 units at breakfast, 8 units at lunch and 12 units at supper, take 30 before each meal  20 mL  1  . losartan (COZAAR) 100 MG tablet Take 1 tablet (100 mg total) by mouth daily.  90 tablet  3  . pregabalin (LYRICA) 100 MG capsule Take 1 capsule (100 mg total) by mouth 3 (three) times daily after meals.  90 capsule  3  . sucralfate (CARAFATE) 1 GM/10ML suspension Take 10 mLs (1 g total) by mouth 4 (four) times daily -  with meals and at bedtime.  420 mL  0  . traMADol (ULTRAM) 50 MG tablet Take 1 tablet (50 mg total) by mouth every 6 (six) hours as needed.  30 tablet  0   Current Facility-Administered Medications on File Prior to Visit  Medication Dose Route Frequency Provider Last Rate Last Dose  .  0.9 %  sodium chloride infusion   Intravenous Continuous Reyne Dumas, MD 20 mL/hr at 08/13/13 1359    . sodium chloride 0.9 % injection 999 mL  999 mL Intravenous Once Angelica Chessman, MD       Review of Systems Constitutional: Negative for increased diaphoresis, other activity, appetite or other siginficant weight change  HENT: Negative for worsening hearing loss, ear pain, facial swelling, mouth sores and neck stiffness.   Eyes: Negative for other worsening pain, redness or visual disturbance.  Respiratory: Negative for shortness of breath and wheezing.   Cardiovascular: Negative for chest pain and palpitations.  Gastrointestinal: Negative for  diarrhea, blood in stool, abdominal distention or other pain Genitourinary: Negative for hematuria, flank pain or change in urine volume.  Musculoskeletal: Negative for myalgias or other joint complaints.  Skin: Negative for color change and wound.  Neurological: Negative for syncope and numbness. other than noted Hematological: Negative for adenopathy. or other swelling Psychiatric/Behavioral: Negative for hallucinations, self-injury, decreased concentration or other worsening agitation.      Objective:   Physical Exam BP 136/86  Pulse 77  Temp(Src) 98.1 F (36.7 C) (Oral)  Ht 5' (1.524 m)  Wt 181 lb 8 oz (82.328 kg)  BMI 35.45 kg/m2  SpO2 95% VS noted,  Constitutional: Pt is oriented to person, place, and time. Appears well-developed and well-nourished.  Head: Normocephalic and atraumatic.  Right Ear: External ear normal.  Left Ear: External ear normal.  Nose: Nose normal.  Mouth/Throat: Oropharynx is clear and moist.  Eyes: Conjunctivae and EOM are normal. Pupils are equal, round, and reactive to light.  Neck: Normal range of motion. Neck supple. No JVD present. No tracheal deviation present.  Cardiovascular: Normal rate, regular rhythm, normal heart sounds and intact distal pulses.   Pulmonary/Chest: Effort normal and breath sounds without rales or wheezing  Abdominal: Soft. Bowel sounds are normal. NT. No HSM  Musculoskeletal: Normal range of motion. Exhibits no edema.  Lymphadenopathy:  Has no cervical adenopathy.  Neurological: Pt is alert and oriented to person, place, and time. Pt has normal reflexes. No cranial nerve deficit. Motor grossly intact Skin: Skin is warm and dry. No rash noted.  Psychiatric:  Has normal mood and affect. Behavior is normal.     Assessment & Plan:

## 2014-01-11 NOTE — Progress Notes (Signed)
Pre visit review using our clinic review tool, if applicable. No additional management support is needed unless otherwise documented below in the visit note. 

## 2014-01-11 NOTE — Patient Instructions (Addendum)
Please take all new medication as prescribed - the protonix at 40 mg per day  You are also given the samples of Linzess 290 to try at 1 per day as needed  Please continue all other medications as before, and refills have been done if requested.  Please have the pharmacy call with any other refills you may need.  Please continue your efforts at being more active, low cholesterol diet, and weight control.  You are otherwise up to date with prevention measures today.  Please keep your appointments with your specialists as you may have planned - Dr Loni Muse will be contacted regarding the referral for: mammogram  Please make your follow up appointment with Dr Dwyane Dee in the next month  Please go to the LAB in the Basement (turn left off the elevator) for the tests to be done today  You will be contacted by phone if any changes need to be made immediately.  Otherwise, you will receive a letter about your results with an explanation, but please check with MyChart first.  Please remember to sign up for MyChart if you have not done so, as this will be important to you in the future with finding out test results, communicating by private email, and scheduling acute appointments online when needed.  Please return in 1 year for your yearly visit, or sooner if needed

## 2014-01-12 NOTE — Assessment & Plan Note (Signed)
Encouraged pt f/u with endo next mo as planned, has had difficult control DM in past

## 2014-01-12 NOTE — Assessment & Plan Note (Signed)
Gave samples linzess 290 qd prn, also rx,  to f/u any worsening symptoms or concerns

## 2014-01-12 NOTE — Assessment & Plan Note (Signed)

## 2014-01-12 NOTE — Assessment & Plan Note (Signed)
For protonix 40 qd,  to f/u any worsening symptoms or concerns 

## 2014-01-15 ENCOUNTER — Other Ambulatory Visit: Payer: Self-pay | Admitting: Internal Medicine

## 2014-01-15 MED ORDER — ATORVASTATIN CALCIUM 20 MG PO TABS: 20.0000 mg | ORAL_TABLET | Freq: Every day | ORAL | Status: DC

## 2014-01-18 ENCOUNTER — Ambulatory Visit: Payer: Managed Care, Other (non HMO) | Admitting: Internal Medicine

## 2014-01-25 ENCOUNTER — Other Ambulatory Visit: Payer: Self-pay | Admitting: Endocrinology

## 2014-01-25 ENCOUNTER — Inpatient Hospital Stay: Admission: RE | Admit: 2014-01-25 | Payer: Managed Care, Other (non HMO) | Source: Ambulatory Visit

## 2014-01-28 ENCOUNTER — Encounter: Payer: Self-pay | Admitting: Internal Medicine

## 2014-06-14 ENCOUNTER — Encounter (HOSPITAL_COMMUNITY): Payer: Self-pay | Admitting: Emergency Medicine

## 2014-06-14 ENCOUNTER — Emergency Department (HOSPITAL_COMMUNITY): Payer: Managed Care, Other (non HMO)

## 2014-06-14 ENCOUNTER — Emergency Department (HOSPITAL_COMMUNITY)
Admission: EM | Admit: 2014-06-14 | Discharge: 2014-06-14 | Disposition: A | Discharge status: Home / Self Care | Payer: Managed Care, Other (non HMO) | Attending: Emergency Medicine | Admitting: Emergency Medicine

## 2014-06-14 DIAGNOSIS — Y9289 Other specified places as the place of occurrence of the external cause: Secondary | ICD-10-CM | POA: Diagnosis not present

## 2014-06-14 DIAGNOSIS — X58XXXA Exposure to other specified factors, initial encounter: Secondary | ICD-10-CM | POA: Insufficient documentation

## 2014-06-14 DIAGNOSIS — Z79899 Other long term (current) drug therapy: Secondary | ICD-10-CM | POA: Insufficient documentation

## 2014-06-14 DIAGNOSIS — Y9389 Activity, other specified: Secondary | ICD-10-CM | POA: Insufficient documentation

## 2014-06-14 DIAGNOSIS — Z794 Long term (current) use of insulin: Secondary | ICD-10-CM | POA: Insufficient documentation

## 2014-06-14 DIAGNOSIS — I10 Essential (primary) hypertension: Secondary | ICD-10-CM | POA: Insufficient documentation

## 2014-06-14 DIAGNOSIS — Y998 Other external cause status: Secondary | ICD-10-CM | POA: Insufficient documentation

## 2014-06-14 DIAGNOSIS — E119 Type 2 diabetes mellitus without complications: Secondary | ICD-10-CM | POA: Insufficient documentation

## 2014-06-14 DIAGNOSIS — S4992XA Unspecified injury of left shoulder and upper arm, initial encounter: Secondary | ICD-10-CM | POA: Diagnosis present

## 2014-06-14 MED ORDER — ONDANSETRON 4 MG PO TBDP
4.0000 mg | ORAL_TABLET | Freq: Once | ORAL | Status: AC
  Administered 2014-06-14: 4 mg via ORAL
  Filled 2014-06-14: qty 1

## 2014-06-14 MED ORDER — HYDROCODONE-ACETAMINOPHEN 5-325 MG PO TABS: 1.0000 | ORAL_TABLET | ORAL | Status: DC | PRN

## 2014-06-14 MED ORDER — OXYCODONE-ACETAMINOPHEN 5-325 MG PO TABS
1.0000 | ORAL_TABLET | Freq: Once | ORAL | Status: AC
  Administered 2014-06-14: 1 via ORAL
  Filled 2014-06-14: qty 1

## 2014-06-14 NOTE — Discharge Instructions (Signed)
Acromioclavicular Injuries °The AC (acromioclavicular) joint is the joint in the shoulder where the collarbone (clavicle) meets the shoulder blade (scapula). The part of the shoulder blade connected to the collarbone is called the acromion. Common problems with and treatments for the AC joint are detailed below. °ARTHRITIS °Arthritis occurs when the joint has been injured and the smooth padding between the joints (cartilage) is lost. This is the wear and tear seen in most joints of the body if they have been overused. This causes the joint to produce pain and swelling which is worse with activity.  °AC JOINT SEPARATION °AC joint separation means that the ligaments connecting the acromion of the shoulder blade and collarbone have been damaged, and the two bones no longer line up. AC separations can be anywhere from mild to severe, and are "graded" depending upon which ligaments are torn and how badly they are torn. °· Grade I Injury: the least damage is done, and the AC joint still lines up. °· Grade II Injury: damage to the ligaments which reinforce the AC joint. In a Grade II injury, these ligaments are stretched but not entirely torn. When stressed, the AC joint becomes painful and unstable. °· Grade III Injury: AC and secondary ligaments are completely torn, and the collarbone is no longer attached to the shoulder blade. This results in deformity; a prominence of the end of the clavicle. °AC JOINT FRACTURE °AC joint fracture means that there has been a break in the bones of the AC joint, usually the end of the clavicle. °TREATMENT °TREATMENT OF AC ARTHRITIS °· There is currently no way to replace the cartilage damaged by arthritis. The best way to improve the condition is to decrease the activities which aggravate the problem. Application of ice to the joint helps decrease pain and soreness (inflammation). The use of non-steroidal anti-inflammatory medication is helpful. °· If less conservative measures do not  work, then cortisone shots (injections) may be used. These are anti-inflammatories; they decrease the soreness in the joint and swelling. °· If non-surgical measures fail, surgery may be recommended. The procedure is generally removal of a portion of the end of the clavicle. This is the part of the collarbone closest to your acromion which is stabilized with ligaments to the acromion of the shoulder blade. This surgery may be performed using a tube-like instrument with a light (arthroscope) for looking into a joint. It may also be performed as an open surgery through a small incision by the surgeon. Most patients will have good range of motion within 6 weeks and may return to all activity including sports by 8-12 weeks, barring complications. °TREATMENT OF AN AC SEPARATION °· The initial treatment is to decrease pain. This is best accomplished by immobilizing the arm in a sling and placing an ice pack to the shoulder for 20 to 30 minutes every 2 hours as needed. As the pain starts to subside, it is important to begin moving the fingers, wrist, elbow and eventually the shoulder in order to prevent a stiff or "frozen" shoulder. Instruction on when and how much to move the shoulder will be provided by your caregiver. The length of time needed to regain full motion and function depends on the amount or grade of the injury. Recovery from a Grade I AC separation usually takes 10 to 14 days, whereas a Grade III may take 6 to 8 weeks. °· Grade I and II separations usually do not require surgery. Even Grade III injuries usually allow return to full   activity with few restrictions. Treatment is also based on the activity demands of the injured shoulder. For example, a high level quarterback with an injured throwing arm will receive more aggressive treatment than someone with a desk job who rarely uses his/her arm for strenuous activities. In some cases, a painful lump may persist which could require a later surgery. Surgery  can be very successful, but the benefits must be weighed against the potential risks. °TREATMENT OF AN AC JOINT FRACTURE °Fracture treatment depends on the type of fracture. Sometimes a splint or sling may be all that is required. Other times surgery may be required for repair. This is more frequently the case when the ligaments supporting the clavicle are completely torn. Your caregiver will help you with these decisions and together you can decide what will be the best treatment. °HOME CARE INSTRUCTIONS  °· Apply ice to the injury for 15-20 minutes each hour while awake for 2 days. Put the ice in a plastic bag and place a towel between the bag of ice and skin. °· If a sling has been applied, wear it constantly for as long as directed by your caregiver, even at night. The sling or splint can be removed for bathing or showering or as directed. Be sure to keep the shoulder in the same place as when the sling is on. Do not lift the arm. °· If a figure-of-eight splint has been applied it should be tightened gently by another person every day. Tighten it enough to keep the shoulders held back. Allow enough room to place the index finger between the body and strap. Loosen the splint immediately if there is numbness or tingling in the hands. °· Take over-the-counter or prescription medicines for pain, discomfort or fever as directed by your caregiver. °· If you or your child has received a follow up appointment, it is very important to keep that appointment in order to avoid long term complications, chronic pain or disability. °SEEK MEDICAL CARE IF:  °· The pain is not relieved with medications. °· There is increased swelling or discoloration that continues to get worse rather than better. °· You or your child has been unable to follow up as instructed. °· There is progressive numbness and tingling in the arm, forearm or hand. °SEEK IMMEDIATE MEDICAL CARE IF:  °· The arm is numb, cold or pale. °· There is increasing pain  in the hand, forearm or fingers. °MAKE SURE YOU:  °· Understand these instructions. °· Will watch your condition. °· Will get help right away if you are not doing well or get worse. °Document Released: 03/31/2005 Document Revised: 09/13/2011 Document Reviewed: 09/23/2008 °[employer]® Patient Information ©2015 [employer]. This information is not intended to replace advice given to you by your health care provider. Make sure you discuss any questions you have with your health care provider. ° °

## 2014-06-14 NOTE — ED Notes (Signed)
Pt c/o left shoulder pain x 1 month that pt sts worse with movement and sts feels like popping

## 2014-06-14 NOTE — ED Provider Notes (Signed)
CSN: [id number]     Arrival date & time 06/14/14  1700 History  This chart was scribed for non-physician practitioner, Delos Haring, PA-C,working with Malvin Johns, MD, by Marlowe Kays, ED Scribe. This patient was seen in room TR07C/TR07C and the patient's care was started at 5:50 PM.  Chief Complaint  Patient presents with  . Shoulder Pain   Patient is a 48 y.o. female presenting with shoulder pain. The history is provided by the patient. No language interpreter was used.  Shoulder Pain   HPI Comments:  [name] [name] is a 48 y.o. female who presents to the Emergency Department complaining of gradually worsening sharp left shoulder pain that began approximately one month ago. Reports associated popping of the shoulder. She states the pain comes on suddenly and lingers for about a minute. Raising the left arm makes the pain worse. Denies alleviating factors. She has been taking Advil to treat the pain with only mild relief. Denies neck pain, left wrist or elbow pain, numbness, tingling or weakness of the LUE. Denies trauma, injury of fall. PMH of DM and HTN.  Past Medical History  Diagnosis Date  . Diabetes mellitus   . Hypertension    Past Surgical History  Procedure Laterality Date  . Abdominal hysterectomy    . Tubal ligation    . Tonsillectomy    . Cesarean section    . Ganglion cyst excision    . Eye surgery Left retinal  . Cholecystectomy     Family History  Problem Relation Age of Onset  . Diabetes Father   . Heart attack Father    History  Substance Use Topics  . Smoking status: Never Smoker   . Smokeless tobacco: Never Used  . Alcohol Use: No   OB History    No data available     Review of Systems  Musculoskeletal: Positive for arthralgias.  Neurological: Negative for weakness and numbness.  All other systems reviewed and are negative.   Allergies  Review of patient's allergies indicates no known allergies.  Home Medications   Prior to Admission  medications   Medication Sig Start Date End Date Taking? Authorizing Provider  acetaminophen-codeine (TYLENOL #3) 300-30 MG per tablet Take 1 tablet by mouth every 4 (four) hours as needed. 10/12/13   Tresa Garter, MD  atorvastatin (LIPITOR) 20 MG tablet Take 1 tablet (20 mg total) by mouth daily. 01/15/14   Biagio Borg, MD  Canagliflozin [facility]) 100 MG TABS Take 1 tablet daily 01/08/14   Elayne Snare, MD  HYDROcodone-acetaminophen (NORCO/VICODIN) 5-325 MG per tablet Take 1-2 tablets by mouth every 4 (four) hours as needed. 06/14/14   Michayla Mcneil Marilu Favre, PA-C  insulin aspart (NOVOLOG) 100 UNIT/ML injection Inject 20 Units into the skin 3 (three) times daily before meals.    Historical Provider, MD  insulin regular human CONCENTRATED (HUMULIN R) 500 UNIT/ML SOLN injection Inject 10 units at breakfast, 8 units at lunch and 12 units at supper, take 30 before each meal 10/18/13   Elayne Snare, MD  Linaclotide (LINZESS) 290 MCG CAPS capsule Take 1 capsule (290 mcg total) by mouth daily. 01/11/14   Biagio Borg, MD  losartan (COZAAR) 100 MG tablet Take 1 tablet (100 mg total) by mouth daily. 10/04/13   Reyne Dumas, MD  pantoprazole (PROTONIX) 40 MG tablet Take 1 tablet (40 mg total) by mouth daily. 01/11/14   Biagio Borg, MD  pregabalin (LYRICA) 100 MG capsule Take 1 capsule (100 mg total) by  mouth 3 (three) times daily after meals. 11/12/13   Tresa Garter, MD  sucralfate (CARAFATE) 1 GM/10ML suspension Take 10 mLs (1 g total) by mouth 4 (four) times daily -  with meals and at bedtime. 12/15/13   Ruthell Rummage Dammen, PA-C  traMADol (ULTRAM) 50 MG tablet Take 1 tablet (50 mg total) by mouth every 6 (six) hours as needed. 11/21/13   Waldemar Dickens, MD   Triage Vitals: BP 158/96 mmHg  Pulse 75  Temp(Src) 97.7 F (36.5 C) (Oral)  Resp 18  SpO2 100% Physical Exam  Constitutional: She is oriented to person, place, and time. She appears well-developed and well-nourished.  HENT:  Head: Normocephalic and  atraumatic.  Eyes: EOM are normal.  Neck: Normal range of motion.  Cardiovascular: Normal rate.   Pulmonary/Chest: Effort normal.  Musculoskeletal:       Left shoulder: She exhibits decreased range of motion, tenderness and pain. She exhibits no bony tenderness, no swelling, no effusion, no crepitus, no deformity, no laceration, no spasm, normal pulse and normal strength.  Neurological: She is alert and oriented to person, place, and time.  Skin: Skin is warm and dry.  Psychiatric: She has a normal mood and affect. Her behavior is normal.  Nursing note and vitals reviewed.   ED Course  Procedures (including critical care time) DIAGNOSTIC STUDIES: Oxygen Saturation is 100% on RA, normal by my interpretation.   COORDINATION OF CARE: 5:55 PM- Will X-Ray left shoulder and order pain medication. Offered sling but pt declined. Pt verbalizes understanding and agrees to plan.  Medications  oxyCODONE-acetaminophen (PERCOCET/ROXICET) 5-325 MG per tablet 1 tablet (1 tablet Oral Given 06/14/14 1828)  ondansetron (ZOFRAN-ODT) disintegrating tablet 4 mg (4 mg Oral Given 06/14/14 1829)    Labs Review Labs Reviewed - No data to display  Imaging Review Dg Shoulder Left  06/14/2014   CLINICAL DATA:  Progressive left shoulder pain for 2 months. No known injury. Limited range of motion. Initial encounter.  EXAM: LEFT SHOULDER - 2+ VIEW  COMPARISON:  None.  FINDINGS: The mineralization and alignment are normal. There is no evidence of acute fracture or dislocation. The subacromial space is preserved. No significant arthropathic changes are demonstrated.  IMPRESSION: No acute osseous findings or significant arthropathic changes.   Electronically Signed   By: Camie Patience M.D.   On: 06/14/2014 18:37     EKG Interpretation None      MDM   Final diagnoses:  Shoulder injury, left, initial encounter   Patient has pain with ROM and only mild pain with rest. Pain significantly worsens with movement.  No SOB, CP, weakness, fevers or cough. P ain has been progressive. Discussed benefits and risks of sling vs no shoulder, we decided not to put her in shoulder sling at this time.  Pain meds and ortho referral given.  48 y.o.Deani Melbourne Selph's evaluation in the Emergency Department is complete. It has been determined that no acute conditions requiring further emergency intervention are present at this time. The patient/guardian have been advised of the diagnosis and plan. We have discussed signs and symptoms that warrant return to the ED, such as changes or worsening in symptoms.  Vital signs are stable at discharge. Filed Vitals:   06/14/14 1709  BP: 158/96  Pulse: 75  Temp: 97.7 F (36.5 C)  Resp: 18    Patient/guardian has voiced understanding and agreed to follow-up with the PCP or specialist.   I personally performed the services described in this documentation,  which was scribed in my presence. The recorded information has been reviewed and is accurate.    Linus Mako, PA-C 06/14/14 1843  Malvin Johns, MD 06/14/14 [phone]

## 2014-06-17 ENCOUNTER — Emergency Department (HOSPITAL_COMMUNITY)
Admission: EM | Admit: 2014-06-17 | Discharge: 2014-06-17 | Disposition: A | Discharge status: Home / Self Care | Payer: Managed Care, Other (non HMO) | Attending: Emergency Medicine | Admitting: Emergency Medicine

## 2014-06-17 ENCOUNTER — Encounter (HOSPITAL_COMMUNITY): Payer: Self-pay | Admitting: *Deleted

## 2014-06-17 DIAGNOSIS — R51 Headache: Secondary | ICD-10-CM | POA: Diagnosis not present

## 2014-06-17 DIAGNOSIS — Z794 Long term (current) use of insulin: Secondary | ICD-10-CM | POA: Diagnosis not present

## 2014-06-17 DIAGNOSIS — I10 Essential (primary) hypertension: Secondary | ICD-10-CM | POA: Insufficient documentation

## 2014-06-17 DIAGNOSIS — E1165 Type 2 diabetes mellitus with hyperglycemia: Secondary | ICD-10-CM | POA: Diagnosis not present

## 2014-06-17 DIAGNOSIS — R519 Headache, unspecified: Secondary | ICD-10-CM

## 2014-06-17 DIAGNOSIS — Z79899 Other long term (current) drug therapy: Secondary | ICD-10-CM | POA: Insufficient documentation

## 2014-06-17 DIAGNOSIS — R739 Hyperglycemia, unspecified: Secondary | ICD-10-CM

## 2014-06-17 DIAGNOSIS — Z791 Long term (current) use of non-steroidal anti-inflammatories (NSAID): Secondary | ICD-10-CM | POA: Diagnosis not present

## 2014-06-17 LAB — I-STAT CHEM 8, ED
BUN: 14 mg/dL (ref 6–23)
Calcium, Ion: 1.14 mmol/L (ref 1.12–1.23)
Chloride: 101 mEq/L (ref 96–112)
Creatinine, Ser: 0.6 mg/dL (ref 0.50–1.10)
Glucose, Bld: 416 mg/dL — ABNORMAL HIGH (ref 70–99)
HCT: 40 % (ref 36.0–46.0)
Hemoglobin: 13.6 g/dL (ref 12.0–15.0)
Potassium: 4.2 mEq/L (ref 3.7–5.3)
Sodium: 136 mEq/L — ABNORMAL LOW (ref 137–147)
TCO2: 25 mmol/L (ref 0–100)

## 2014-06-17 LAB — CBG MONITORING, ED: Glucose-Capillary: 288 mg/dL — ABNORMAL HIGH (ref 70–99)

## 2014-06-17 MED ORDER — KETOROLAC TROMETHAMINE 30 MG/ML IJ SOLN
30.0000 mg | Freq: Once | INTRAMUSCULAR | Status: AC
Start: 2014-06-17 — End: 2014-06-17
  Administered 2014-06-17: 30 mg via INTRAVENOUS
  Filled 2014-06-17: qty 1

## 2014-06-17 MED ORDER — DIPHENHYDRAMINE HCL 50 MG/ML IJ SOLN
25.0000 mg | Freq: Once | INTRAMUSCULAR | Status: AC
  Administered 2014-06-17: 25 mg via INTRAVENOUS
  Filled 2014-06-17: qty 1

## 2014-06-17 MED ORDER — PROCHLORPERAZINE EDISYLATE 5 MG/ML IJ SOLN
10.0000 mg | Freq: Once | INTRAMUSCULAR | Status: AC
Start: 2014-06-17 — End: 2014-06-17
  Administered 2014-06-17: 10 mg via INTRAVENOUS
  Filled 2014-06-17: qty 2

## 2014-06-17 MED ORDER — INSULIN ASPART 100 UNIT/ML ~~LOC~~ SOLN: 5.0000 [IU] | Freq: Once | SUBCUTANEOUS | Status: DC

## 2014-06-17 MED ORDER — NAPROXEN 500 MG PO TABS: 500.0000 mg | ORAL_TABLET | Freq: Two times a day (BID) | ORAL | Status: DC

## 2014-06-17 MED ORDER — SODIUM CHLORIDE 0.9 % IV BOLUS (SEPSIS)
1000.0000 mL | Freq: Once | INTRAVENOUS | Status: AC
  Administered 2014-06-17: 1000 mL via INTRAVENOUS

## 2014-06-17 NOTE — Discharge Instructions (Signed)
Headache:  You are having a headache. No specific cause was found today for your headache. It may have been a migraine or other cause of headache. Stress, anxiety, fatigue, and depression are common triggers for headaches. Your headache today does not appear to be life-threatening or require hospitalization, but often the exact cause of headaches is not determined in the emergency department. Therefore, followup with your doctor is very important to find out what may have caused your headache, and whether or not you need any further diagnostic testing or treatment. Sometimes headaches can appear benign but then more serious symptoms can develop which should prompt an immediate reevaluation by your doctor or the emergency department.  Seek immediate medical attention if:   You develop possible problems with medications prescribed.  The medications don't resolve your headache, if it recurs, or if you have multiple episodes of vomiting or can't take fluids by mouth  You have a change from the usual headache.  If you developed a sudden severe headache or confusion, become poorly responsive or faint, developed a fever above 100.4 or problems breathing, have a change in speech, vision, swallowing or understanding, or developed new weakness, numbness, tingling, incoordination or have a seizure.

## 2014-06-17 NOTE — ED Provider Notes (Signed)
CSN: [id number]     Arrival date & time 06/17/14  0557 History   First MD Initiated Contact with Patient 06/17/14 0622     Chief Complaint  Patient presents with  . Headache     (Consider location/radiation/quality/duration/timing/severity/associated sxs/prior Treatment) HPI Comments: The pt presents with c/o headache and htn.  This started on Friday (3 days ago), was gradual in onset, constant, and has been fluctuating in location and intensity - it was initiall all over the head but now has concentrated itself to the L temporal area.  She has not had any neurological complaints with this but has had some high blood prsesure which she thinks is the source of the headache.  She has been taking her lisinopril dutifully and usually doesn't check her BP but states that when she developed the headache, she started to check it frequently more and more and noted that the BP continued to rise to a maximum of 200 / 107 last night - this morning it was not as high.  Headache continues.  No trouble walking, talking or swallowing.  No n/v/f/c or stiff neck.  She has no numbness, weakness or visual complaints.    Patient is a 48 y.o. female presenting with headaches. The history is provided by the patient, the spouse and medical records.  Headache   Past Medical History  Diagnosis Date  . Diabetes mellitus   . Hypertension    Past Surgical History  Procedure Laterality Date  . Abdominal hysterectomy    . Tubal ligation    . Tonsillectomy    . Cesarean section    . Ganglion cyst excision    . Eye surgery Left retinal  . Cholecystectomy     Family History  Problem Relation Age of Onset  . Diabetes Father   . Heart attack Father    History  Substance Use Topics  . Smoking status: Never Smoker   . Smokeless tobacco: Never Used  . Alcohol Use: No   OB History    No data available     Review of Systems  Neurological: Positive for headaches.  All other systems reviewed and are  negative.     Allergies  Tylenol  Home Medications   Prior to Admission medications   Medication Sig Start Date End Date Taking? Authorizing Provider  atorvastatin (LIPITOR) 20 MG tablet Take 1 tablet (20 mg total) by mouth daily. 01/15/14  Yes Biagio Borg, MD  HYDROcodone-acetaminophen (NORCO/VICODIN) 5-325 MG per tablet Take 1-2 tablets by mouth every 4 (four) hours as needed. Patient taking differently: Take 1-2 tablets by mouth every 4 (four) hours as needed for moderate pain.  06/14/14  Yes Tiffany Marilu Favre, PA-C  ibuprofen (ADVIL,MOTRIN) 800 MG tablet Take 800 mg by mouth every 8 (eight) hours as needed for moderate pain.   Yes Historical Provider, MD  insulin aspart (NOVOLOG) 100 UNIT/ML injection Inject 20 Units into the skin 3 (three) times daily before meals.   Yes Historical Provider, MD  insulin regular human CONCENTRATED (HUMULIN R) 500 UNIT/ML SOLN injection Inject 10 units at breakfast, 8 units at lunch and 12 units at supper, take 30 before each meal 10/18/13  Yes Elayne Snare, MD  lisinopril (PRINIVIL,ZESTRIL) 10 MG tablet Take 10 mg by mouth daily.   Yes Historical Provider, MD  pregabalin (LYRICA) 100 MG capsule Take 1 capsule (100 mg total) by mouth 3 (three) times daily after meals. 11/12/13  Yes Tresa Garter, MD  traMADol (ULTRAM) 50 MG  tablet Take 1 tablet (50 mg total) by mouth every 6 (six) hours as needed. Patient taking differently: Take 50 mg by mouth every 6 (six) hours as needed for moderate pain.  11/21/13  Yes Waldemar Dickens, MD  acetaminophen-codeine (TYLENOL #3) 300-30 MG per tablet Take 1 tablet by mouth every 4 (four) hours as needed. Patient not taking: Reported on 06/17/2014 10/12/13   Tresa Garter, MD  Canagliflozin [facility]) 100 MG TABS Take 1 tablet daily Patient not taking: Reported on 06/17/2014 01/08/14   Elayne Snare, MD  Linaclotide [facility]) 290 MCG CAPS capsule Take 1 capsule (290 mcg total) by mouth daily. Patient not taking: Reported  on 06/17/2014 01/11/14   Biagio Borg, MD  losartan (COZAAR) 100 MG tablet Take 1 tablet (100 mg total) by mouth daily. Patient not taking: Reported on 06/17/2014 10/04/13   Reyne Dumas, MD  naproxen (NAPROSYN) 500 MG tablet Take 1 tablet (500 mg total) by mouth 2 (two) times daily with a meal. 06/17/14   Johnna Acosta, MD  pantoprazole (PROTONIX) 40 MG tablet Take 1 tablet (40 mg total) by mouth daily. Patient not taking: Reported on 06/17/2014 01/11/14   Biagio Borg, MD  sucralfate (CARAFATE) 1 GM/10ML suspension Take 10 mLs (1 g total) by mouth 4 (four) times daily -  with meals and at bedtime. Patient not taking: Reported on 06/17/2014 12/15/13   Ruthell Rummage Dammen, PA-C   BP 145/83 mmHg  Pulse 74  Temp(Src) 98.2 F (36.8 C)  Resp 18  Ht 5' (1.524 m)  Wt 170 lb (77.111 kg)  BMI 33.20 kg/m2  SpO2 99% Physical Exam  Constitutional: She appears well-developed and well-nourished. No distress.  HENT:  Head: Normocephalic and atraumatic.  Mouth/Throat: Oropharynx is clear and moist. No oropharyngeal exudate.  Clear OP, nasal passages.  Has no trismus, no sinus ttp  Eyes: Conjunctivae and EOM are normal. Pupils are equal, round, and reactive to light. Right eye exhibits no discharge. Left eye exhibits no discharge. No scleral icterus.  Neck: Normal range of motion. Neck supple. No JVD present. No thyromegaly present.  Cardiovascular: Normal rate, regular rhythm, normal heart sounds and intact distal pulses.  Exam reveals no gallop and no friction rub.   No murmur heard. Pulmonary/Chest: Effort normal and breath sounds normal. No respiratory distress. She has no wheezes. She has no rales.  Abdominal: Soft. Bowel sounds are normal. She exhibits no distension and no mass. There is no tenderness.  Musculoskeletal: Normal range of motion. She exhibits no edema or tenderness.  Lymphadenopathy:    She has no cervical adenopathy.  Neurological: She is alert. Coordination normal.  Neurologic  exam:  Speech clear, pupils equal round reactive to light, extraocular movements intact  Normal peripheral visual fields Cranial nerves III through XII normal including no facial droop Follows commands, moves all extremities x4, normal strength to bilateral upper and lower extremities at all major muscle groups including grip Sensation normal to light touch and pinprick Coordination intact, no limb ataxia, finger-nose-finger normal Rapid alternating movements normal No pronator drift Gait normal   Skin: Skin is warm and dry. No rash noted. No erythema.  Psychiatric: She has a normal mood and affect. Her behavior is normal.  Nursing note and vitals reviewed.   ED Course  Procedures (including critical care time) Labs Review Labs Reviewed  I-STAT CHEM 8, ED - Abnormal; Notable for the following:    Sodium 136 (*)    Glucose, Bld 416 (*)  All other components within normal limits  CBG MONITORING, ED - Abnormal; Notable for the following:    Glucose-Capillary 288 (*)    All other components within normal limits    Imaging Review No results found.    MDM   Final diagnoses:  Nonintractable headache, unspecified chronicity pattern, unspecified headache type  Essential hypertension  Hyperglycemia    The neuro and head exams are normal, the VS now show BP of 140/77.  Treat headache, doubt that Htn is the source of the headache - she has been seen multiple times in the past for Htn / other pain complaints as well.  Does not have malignant readings - likely well controlled and had frequent BP check Htn.  No indication for CT of the head, treat with pain meds, fluids, recheck.  Pt in agreement with plan.  Headache much improved after medications, blood sugar has spontaneously improved and patient can continue taking her medication at home, no signs of diabetic ketoacidosis, no increased anion gap, patient stable for discharge, blood pressures have been reasonable for  discharge.  Encourage two-week follow-up on blood pressure, Naprosyn for headaches  Meds given in ED:  Medications  insulin aspart (novoLOG) injection 5 Units (0 Units Subcutaneous Hold 06/17/14 0847)  ketorolac (TORADOL) 30 MG/ML injection 30 mg (30 mg Intravenous Given 06/17/14 0746)  prochlorperazine (COMPAZINE) injection 10 mg (10 mg Intravenous Given 06/17/14 0746)  diphenhydrAMINE (BENADRYL) injection 25 mg (25 mg Intravenous Given 06/17/14 0746)  sodium chloride 0.9 % bolus 1,000 mL (1,000 mLs Intravenous New Bag/Given 06/17/14 0746)    New Prescriptions   NAPROXEN (NAPROSYN) 500 MG TABLET    Take 1 tablet (500 mg total) by mouth 2 (two) times daily with a meal.      Johnna Acosta, MD 06/17/14 [phone]

## 2014-06-17 NOTE — ED Notes (Signed)
Pt states headache and elevated BP x2 days. No nausea/vomiting. Pt is alert and oriented x4. No neurological symptoms. No signs of distress noted at present.

## 2014-06-17 NOTE — ED Notes (Signed)
CBG 288

## 2014-06-17 NOTE — ED Notes (Signed)
The pt is c/o a headache and her bp has been high for the past 3 days.  She has been taking her bp meds

## 2014-08-11 ENCOUNTER — Encounter (HOSPITAL_COMMUNITY): Payer: Self-pay | Admitting: *Deleted

## 2014-08-11 ENCOUNTER — Emergency Department (INDEPENDENT_AMBULATORY_CARE_PROVIDER_SITE_OTHER)
Admission: EM | Admit: 2014-08-11 | Discharge: 2014-08-11 | Disposition: A | Discharge status: Home / Self Care | Payer: Managed Care, Other (non HMO) | Source: Home / Self Care | Attending: Emergency Medicine | Admitting: Emergency Medicine

## 2014-08-11 ENCOUNTER — Emergency Department (INDEPENDENT_AMBULATORY_CARE_PROVIDER_SITE_OTHER): Payer: Managed Care, Other (non HMO)

## 2014-08-11 DIAGNOSIS — M25521 Pain in right elbow: Secondary | ICD-10-CM

## 2014-08-11 DIAGNOSIS — M25531 Pain in right wrist: Secondary | ICD-10-CM | POA: Diagnosis not present

## 2014-08-11 DIAGNOSIS — M25561 Pain in right knee: Secondary | ICD-10-CM

## 2014-08-11 MED ORDER — KETOROLAC TROMETHAMINE 60 MG/2ML IM SOLN
INTRAMUSCULAR | Status: AC
  Filled 2014-08-11: qty 2

## 2014-08-11 MED ORDER — DICLOFENAC SODIUM 75 MG PO TBEC: 75.0000 mg | DELAYED_RELEASE_TABLET | Freq: Two times a day (BID) | ORAL | Status: DC

## 2014-08-11 MED ORDER — KETOROLAC TROMETHAMINE 60 MG/2ML IM SOLN
60.0000 mg | Freq: Once | INTRAMUSCULAR | Status: AC
  Administered 2014-08-11: 60 mg via INTRAMUSCULAR

## 2014-08-11 MED ORDER — MORPHINE SULFATE 2 MG/ML IJ SOLN
2.0000 mg | Freq: Once | INTRAMUSCULAR | Status: AC
Start: 2014-08-11 — End: 2014-08-11
  Administered 2014-08-11: 2 mg via INTRAMUSCULAR

## 2014-08-11 MED ORDER — MORPHINE SULFATE 2 MG/ML IJ SOLN: 2.0000 mg | Freq: Once | INTRAMUSCULAR | Status: DC

## 2014-08-11 MED ORDER — OXYCODONE HCL 5 MG PO TABA: 5.0000 mg | ORAL_TABLET | ORAL | Status: DC | PRN

## 2014-08-11 MED ORDER — MORPHINE SULFATE 2 MG/ML IJ SOLN
INTRAMUSCULAR | Status: AC
  Filled 2014-08-11: qty 1

## 2014-08-11 MED ORDER — HYDROCODONE-ACETAMINOPHEN 5-325 MG PO TABS: 2.0000 | ORAL_TABLET | Freq: Once | ORAL | Status: DC
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
## 2014-08-11 NOTE — Discharge Instructions (Signed)
You do not have any broken bones. You are going to feel sore for 1-2 weeks. Use the sling and brace as needed for comfort. Take diclofenac twice a day for the next week, then as needed. Apply ice to the affected areas 2-3 times a day. Use oxycodone every 4 hours as needed for severe pain. Follow-up if no improvement in 1-2 weeks.

## 2014-09-13 ENCOUNTER — Other Ambulatory Visit: Payer: Self-pay | Admitting: Occupational Medicine

## 2014-09-13 ENCOUNTER — Ambulatory Visit: Payer: Self-pay

## 2014-09-13 DIAGNOSIS — M25512 Pain in left shoulder: Secondary | ICD-10-CM

## 2014-09-23 ENCOUNTER — Other Ambulatory Visit: Payer: Self-pay | Admitting: Cardiology

## 2014-10-09 ENCOUNTER — Ambulatory Visit (INDEPENDENT_AMBULATORY_CARE_PROVIDER_SITE_OTHER): Payer: Managed Care, Other (non HMO) | Admitting: Internal Medicine

## 2014-10-09 ENCOUNTER — Ambulatory Visit: Payer: Self-pay | Admitting: Internal Medicine

## 2014-10-09 ENCOUNTER — Encounter: Payer: Self-pay | Admitting: Internal Medicine

## 2014-10-09 VITALS — BP 120/86 | HR 83 | Temp 98.2°F | Resp 18 | Ht 60.0 in | Wt 165.0 lb

## 2014-10-09 DIAGNOSIS — Z0000 Encounter for general adult medical examination without abnormal findings: Secondary | ICD-10-CM

## 2014-10-09 DIAGNOSIS — R238 Other skin changes: Secondary | ICD-10-CM | POA: Insufficient documentation

## 2014-10-09 DIAGNOSIS — R233 Spontaneous ecchymoses: Secondary | ICD-10-CM

## 2014-10-09 DIAGNOSIS — E1165 Type 2 diabetes mellitus with hyperglycemia: Secondary | ICD-10-CM

## 2014-10-09 DIAGNOSIS — IMO0002 Reserved for concepts with insufficient information to code with codable children: Secondary | ICD-10-CM

## 2014-10-09 MED ORDER — LOSARTAN POTASSIUM 100 MG PO TABS: 100.0000 mg | ORAL_TABLET | Freq: Every day | ORAL | Status: DC

## 2014-10-09 NOTE — Patient Instructions (Addendum)
Please continue all other medications as before, and refills have been done if requested.  Please have the pharmacy call with any other refills you may need.  Please continue your efforts at being more active, low cholesterol diet, and weight control.  You are otherwise up to date with prevention measures today.  Please keep your appointments with your specialists as you may have planned  You will be contacted regarding the referral for: mammogram  Please go to the LAB in the Basement (turn left off the elevator) for the tests to be done tomorrow  You will be contacted by phone if any changes need to be made immediately.  Otherwise, you will receive a letter about your results with an explanation, but please check with MyChart first.  Please remember to sign up for MyChart if you have not done so, as this will be important to you in the future with finding out test results, communicating by private email, and scheduling acute appointments online when needed.  Please return in 6 months, or sooner if needed
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
## 2014-10-10 ENCOUNTER — Other Ambulatory Visit (INDEPENDENT_AMBULATORY_CARE_PROVIDER_SITE_OTHER): Payer: Managed Care, Other (non HMO)

## 2014-10-10 ENCOUNTER — Encounter (HOSPITAL_COMMUNITY): Payer: Self-pay | Admitting: Emergency Medicine

## 2014-10-10 ENCOUNTER — Other Ambulatory Visit: Payer: Managed Care, Other (non HMO)

## 2014-10-10 ENCOUNTER — Telehealth: Payer: Self-pay

## 2014-10-10 ENCOUNTER — Emergency Department (HOSPITAL_COMMUNITY)
Admission: EM | Admit: 2014-10-10 | Discharge: 2014-10-10 | Disposition: A | Discharge status: Home / Self Care | Payer: Managed Care, Other (non HMO) | Attending: Emergency Medicine | Admitting: Emergency Medicine

## 2014-10-10 DIAGNOSIS — IMO0002 Reserved for concepts with insufficient information to code with codable children: Secondary | ICD-10-CM

## 2014-10-10 DIAGNOSIS — I10 Essential (primary) hypertension: Secondary | ICD-10-CM | POA: Insufficient documentation

## 2014-10-10 DIAGNOSIS — Z79899 Other long term (current) drug therapy: Secondary | ICD-10-CM | POA: Insufficient documentation

## 2014-10-10 DIAGNOSIS — R739 Hyperglycemia, unspecified: Secondary | ICD-10-CM

## 2014-10-10 DIAGNOSIS — R233 Spontaneous ecchymoses: Secondary | ICD-10-CM

## 2014-10-10 DIAGNOSIS — R238 Other skin changes: Secondary | ICD-10-CM | POA: Diagnosis not present

## 2014-10-10 DIAGNOSIS — N390 Urinary tract infection, site not specified: Secondary | ICD-10-CM | POA: Diagnosis not present

## 2014-10-10 DIAGNOSIS — E1165 Type 2 diabetes mellitus with hyperglycemia: Secondary | ICD-10-CM | POA: Insufficient documentation

## 2014-10-10 DIAGNOSIS — Z794 Long term (current) use of insulin: Secondary | ICD-10-CM | POA: Insufficient documentation

## 2014-10-10 DIAGNOSIS — Z0000 Encounter for general adult medical examination without abnormal findings: Secondary | ICD-10-CM

## 2014-10-10 DIAGNOSIS — Z791 Long term (current) use of non-steroidal anti-inflammatories (NSAID): Secondary | ICD-10-CM | POA: Diagnosis not present

## 2014-10-10 LAB — CBG MONITORING, ED
Glucose-Capillary: 513 mg/dL — ABNORMAL HIGH (ref 70–99)
Glucose-Capillary: 409 mg/dL — ABNORMAL HIGH (ref 70–99)
Glucose-Capillary: 225 mg/dL — ABNORMAL HIGH (ref 70–99)

## 2014-10-10 LAB — CBC WITH DIFFERENTIAL/PLATELET
Basophils Absolute: 0 10*3/uL (ref 0.0–0.1)
Basophils Absolute: 0 10*3/uL (ref 0.0–0.1)
Basophils Relative: 0.6 % (ref 0.0–3.0)
Basophils Relative: 1 % (ref 0–1)
Eosinophils Absolute: 0.1 10*3/uL (ref 0.0–0.7)
Eosinophils Absolute: 0.1 10*3/uL (ref 0.0–0.7)
Eosinophils Relative: 1.1 % (ref 0.0–5.0)
Eosinophils Relative: 1 % (ref 0–5)
HCT: 38.5 % (ref 36.0–46.0)
HCT: 38.7 % (ref 36.0–46.0)
Hemoglobin: 13 g/dL (ref 12.0–15.0)
Hemoglobin: 13 g/dL (ref 12.0–15.0)
Lymphocytes Relative: 39.7 % (ref 12.0–46.0)
Lymphocytes Relative: 48 % — ABNORMAL HIGH (ref 12–46)
Lymphs Abs: 2.4 10*3/uL (ref 0.7–4.0)
Lymphs Abs: 3.1 10*3/uL (ref 0.7–4.0)
MCH: 26.9 pg (ref 26.0–34.0)
MCHC: 33.7 g/dL (ref 30.0–36.0)
MCHC: 33.6 g/dL (ref 30.0–36.0)
MCV: 80.1 fl (ref 78.0–100.0)
MCV: 80.1 fL (ref 78.0–100.0)
Monocytes Absolute: 0.4 10*3/uL (ref 0.1–1.0)
Monocytes Absolute: 0.4 10*3/uL (ref 0.1–1.0)
Monocytes Relative: 6 % (ref 3.0–12.0)
Monocytes Relative: 7 % (ref 3–12)
Neutro Abs: 3.2 10*3/uL (ref 1.4–7.7)
Neutro Abs: 2.7 10*3/uL (ref 1.7–7.7)
Neutrophils Relative %: 52.6 % (ref 43.0–77.0)
Neutrophils Relative %: 43 % (ref 43–77)
Platelets: 173 10*3/uL (ref 150.0–400.0)
Platelets: 159 10*3/uL (ref 150–400)
RBC: 4.81 Mil/uL (ref 3.87–5.11)
RBC: 4.83 MIL/uL (ref 3.87–5.11)
RDW: 13.7 % (ref 11.5–15.5)
RDW: 12.9 % (ref 11.5–15.5)
WBC: 6.2 10*3/uL (ref 4.0–10.5)
WBC: 6.3 10*3/uL (ref 4.0–10.5)

## 2014-10-10 LAB — URINALYSIS, ROUTINE W REFLEX MICROSCOPIC
Bilirubin Urine: NEGATIVE
Bilirubin Urine: NEGATIVE
Glucose, UA: >1000 mg/dL — AB
Ketones, ur: NEGATIVE
Ketones, ur: NEGATIVE mg/dL
Leukocytes, UA: NEGATIVE
Nitrite: NEGATIVE
Nitrite: NEGATIVE
Protein, ur: NEGATIVE mg/dL
Specific Gravity, Urine: <=1.005 — AB (ref 1.000–1.030)
Specific Gravity, Urine: <1.005 — ABNORMAL LOW (ref 1.005–1.030)
Urine Glucose: >=1000 — AB
Urobilinogen, UA: 0.2 (ref 0.0–1.0)
Urobilinogen, UA: 0.2 mg/dL (ref 0.0–1.0)
pH: 5.5 (ref 5.0–8.0)
pH: 5.5 (ref 5.0–8.0)

## 2014-10-10 LAB — BASIC METABOLIC PANEL
BUN: 14 mg/dL (ref 6–23)
CO2: 29 mEq/L (ref 19–32)
Calcium: 9.1 mg/dL (ref 8.4–10.5)
Chloride: 93 mEq/L — ABNORMAL LOW (ref 96–112)
Creatinine, Ser: 0.91 mg/dL (ref 0.40–1.20)
GFR: 84.78 mL/min (ref >60.00)
Glucose, Bld: 603 mg/dL (ref 70–99)
Potassium: 4.4 mEq/L (ref 3.5–5.1)
Sodium: 127 mEq/L — ABNORMAL LOW (ref 135–145)

## 2014-10-10 LAB — COMPREHENSIVE METABOLIC PANEL
ALT: 15 U/L (ref 0–35)
AST: 14 U/L (ref 0–37)
Albumin: 3.5 g/dL (ref 3.5–5.2)
Alkaline Phosphatase: 138 U/L — ABNORMAL HIGH (ref 39–117)
Anion gap: 13 (ref 5–15)
BUN: 13 mg/dL (ref 6–23)
CO2: 25 mmol/L (ref 19–32)
Calcium: 8.9 mg/dL (ref 8.4–10.5)
Chloride: 94 mmol/L — ABNORMAL LOW (ref 96–112)
Creatinine, Ser: 0.79 mg/dL (ref 0.50–1.10)
GFR calc Af Amer: >90 mL/min (ref >90)
GFR calc non Af Amer: >90 mL/min (ref >90)
Glucose, Bld: 549 mg/dL — ABNORMAL HIGH (ref 70–99)
Potassium: 4.2 mmol/L (ref 3.5–5.1)
Sodium: 132 mmol/L — ABNORMAL LOW (ref 135–145)
Total Bilirubin: 0.6 mg/dL (ref 0.3–1.2)
Total Protein: 7 g/dL (ref 6.0–8.3)

## 2014-10-10 LAB — HEPATIC FUNCTION PANEL
ALT: 12 U/L (ref 0–35)
AST: 12 U/L (ref 0–37)
Albumin: 3.8 g/dL (ref 3.5–5.2)
Alkaline Phosphatase: 152 U/L — ABNORMAL HIGH (ref 39–117)
Bilirubin, Direct: 0.1 mg/dL (ref 0.0–0.3)
Total Bilirubin: 0.5 mg/dL (ref 0.2–1.2)
Total Protein: 7.2 g/dL (ref 6.0–8.3)

## 2014-10-10 LAB — I-STAT VENOUS BLOOD GAS, ED
Acid-Base Excess: 3 mmol/L — ABNORMAL HIGH (ref 0.0–2.0)
Bicarbonate: 29.8 mEq/L — ABNORMAL HIGH (ref 20.0–24.0)
O2 Saturation: 15 %
TCO2: 31 mmol/L (ref 0–100)
pCO2, Ven: 51.5 mmHg — ABNORMAL HIGH (ref 45.0–50.0)
pH, Ven: 7.371 — ABNORMAL HIGH (ref 7.250–7.300)
pO2, Ven: 14 mmHg — CL (ref 30.0–45.0)

## 2014-10-10 LAB — LIPID PANEL
Cholesterol: 283 mg/dL — ABNORMAL HIGH (ref 0–200)
HDL: 54.9 mg/dL (ref >39.00)
LDL Cholesterol: 194 mg/dL — ABNORMAL HIGH (ref 0–99)
NonHDL: 228.1
Total CHOL/HDL Ratio: 5
Triglycerides: 169 mg/dL — ABNORMAL HIGH (ref 0.0–149.0)
VLDL: 33.8 mg/dL (ref 0.0–40.0)

## 2014-10-10 LAB — TSH: TSH: 1.24 u[IU]/mL (ref 0.35–4.50)

## 2014-10-10 LAB — URINE MICROSCOPIC-ADD ON

## 2014-10-10 LAB — PROTIME-INR
INR: 1 ratio (ref 0.8–1.0)
Prothrombin Time: 10.9 s (ref 9.6–13.1)

## 2014-10-10 MED ORDER — INSULIN ASPART 100 UNIT/ML ~~LOC~~ SOLN
14.0000 [IU] | Freq: Once | SUBCUTANEOUS | Status: AC
  Administered 2014-10-10: 14 [IU] via INTRAVENOUS
  Filled 2014-10-10: qty 1

## 2014-10-10 MED ORDER — CEPHALEXIN 500 MG PO CAPS: 500.0000 mg | ORAL_CAPSULE | Freq: Three times a day (TID) | ORAL | Status: DC

## 2014-10-10 MED ORDER — INSULIN REGULAR HUMAN (CONC) 500 UNIT/ML ~~LOC~~ SOLN: SUBCUTANEOUS | Status: DC

## 2014-10-10 MED ORDER — SODIUM CHLORIDE 0.9 % IV BOLUS (SEPSIS)
1000.0000 mL | Freq: Once | INTRAVENOUS | Status: AC
  Administered 2014-10-10: 1000 mL via INTRAVENOUS

## 2014-10-10 NOTE — ED Notes (Signed)
VBG result reported to Dr. Venora Maples

## 2014-10-10 NOTE — ED Provider Notes (Signed)
CSN: [id number]     Arrival date & time 10/10/14  1908 History   First MD Initiated Contact with Patient 10/10/14 2017     Chief Complaint  Patient presents with  . Hyperglycemia     (Consider location/radiation/quality/duration/timing/severity/associated sxs/prior Treatment) Patient is a 49 y.o. female presenting with hyperglycemia. The history is provided by the patient and medical records.  Hyperglycemia Associated symptoms: increased thirst and polyuria     This is a 49 year old female with past medical history significant for hypertension and diabetes, presenting to the ED for hyperglycemia. Patient states she had routine blood work on her PCP office earlier today and was called later this afternoon because her blood sugar was greater than 600. Patient is supposed to be on daily insulin, she has not taken in the past several months.  States she does not have a good reason as to why she has not been taking her meds.  She does admit to polydipsia and polyuria. She denies any dysuria or hematuria. No fever or chills. She has had some nausea but denies vomiting. No chest pain or shortness of breath.  VSS.  Past Medical History  Diagnosis Date  . Diabetes mellitus   . Hypertension    Past Surgical History  Procedure Laterality Date  . Abdominal hysterectomy    . Tubal ligation    . Tonsillectomy    . Cesarean section    . Ganglion cyst excision    . Eye surgery Left retinal  . Cholecystectomy     Family History  Problem Relation Age of Onset  . Diabetes Father   . Heart attack Father    History  Substance Use Topics  . Smoking status: Never Smoker   . Smokeless tobacco: Never Used  . Alcohol Use: No   OB History    No data available     Review of Systems  Endocrine: Positive for polydipsia and polyuria.  All other systems reviewed and are negative.     Allergies  Tylenol  Home Medications   Prior to Admission medications   Medication Sig Start Date End Date  Taking? Authorizing Provider  acetaminophen-codeine (TYLENOL #3) 300-30 MG per tablet Take 1 tablet by mouth every 4 (four) hours as needed. 10/12/13   Tresa Garter, MD  atorvastatin (LIPITOR) 20 MG tablet Take 1 tablet (20 mg total) by mouth daily. 01/15/14   Biagio Borg, MD  Canagliflozin [facility]) 100 MG TABS Take 1 tablet daily 01/08/14   Elayne Snare, MD  diclofenac (VOLTAREN) 75 MG EC tablet Take 1 tablet (75 mg total) by mouth 2 (two) times daily. 08/11/14   Melony Overly, MD  HYDROcodone-acetaminophen (NORCO/VICODIN) 5-325 MG per tablet Take 1-2 tablets by mouth every 4 (four) hours as needed. Patient taking differently: Take 1-2 tablets by mouth every 4 (four) hours as needed for moderate pain.  06/14/14   Tiffany Carlota Raspberry, PA-C  ibuprofen (ADVIL,MOTRIN) 800 MG tablet Take 800 mg by mouth every 8 (eight) hours as needed for moderate pain.    Historical Provider, MD  insulin aspart (NOVOLOG) 100 UNIT/ML injection Inject 20 Units into the skin 3 (three) times daily before meals.    Historical Provider, MD  insulin regular human CONCENTRATED (HUMULIN R) 500 UNIT/ML SOLN injection Inject 10 units at breakfast, 8 units at lunch and 12 units at supper, take 30 before each meal 10/18/13   Elayne Snare, MD  Linaclotide (LINZESS) 290 MCG CAPS capsule Take 1 capsule (290 mcg total) by mouth  daily. 01/11/14   Biagio Borg, MD  lisinopril (PRINIVIL,ZESTRIL) 10 MG tablet Take 10 mg by mouth daily.    Historical Provider, MD  losartan (COZAAR) 100 MG tablet Take 1 tablet (100 mg total) by mouth daily. 10/09/14   Biagio Borg, MD  naproxen (NAPROSYN) 500 MG tablet Take 1 tablet (500 mg total) by mouth 2 (two) times daily with a meal. 06/17/14   Noemi Chapel, MD  OxyCODONE HCl, Abuse Deter, 5 MG TABA Take 5 mg by mouth every 4 (four) hours as needed (pain). 08/11/14   Melony Overly, MD  pantoprazole (PROTONIX) 40 MG tablet Take 1 tablet (40 mg total) by mouth daily. 01/11/14   Biagio Borg, MD  pregabalin (LYRICA) 100  MG capsule Take 1 capsule (100 mg total) by mouth 3 (three) times daily after meals. 11/12/13   Tresa Garter, MD  sucralfate (CARAFATE) 1 GM/10ML suspension Take 10 mLs (1 g total) by mouth 4 (four) times daily -  with meals and at bedtime. 12/15/13   Hazel Sams, PA-C  traMADol (ULTRAM) 50 MG tablet Take 1 tablet (50 mg total) by mouth every 6 (six) hours as needed. Patient taking differently: Take 50 mg by mouth every 6 (six) hours as needed for moderate pain.  11/21/13   Waldemar Dickens, MD   BP 134/88 mmHg  Pulse 72  Temp(Src) 98.3 F (36.8 C) (Oral)  Resp 12  Ht 5' (1.524 m)  Wt 163 lb (73.936 kg)  BMI 31.83 kg/m2  SpO2 100%   Physical Exam  Constitutional: She is oriented to person, place, and time. She appears well-developed and well-nourished. No distress.  HENT:  Head: Normocephalic and atraumatic.  Mouth/Throat: Oropharynx is clear and moist.  Mildly dry mucous membranes  Eyes: Conjunctivae and EOM are normal. Pupils are equal, round, and reactive to light.  Neck: Normal range of motion. Neck supple.  Cardiovascular: Normal rate, regular rhythm and normal heart sounds.   Pulmonary/Chest: Effort normal and breath sounds normal. No respiratory distress. She has no wheezes.  Abdominal: Soft. Bowel sounds are normal. There is no tenderness. There is no guarding and no CVA tenderness.  Musculoskeletal: Normal range of motion. She exhibits no edema.  Neurological: She is alert and oriented to person, place, and time.  Skin: Skin is warm and dry. She is not diaphoretic.  Psychiatric: She has a normal mood and affect.  Nursing note and vitals reviewed.   ED Course  Procedures (including critical care time) Labs Review Labs Reviewed  CBC WITH DIFFERENTIAL/PLATELET - Abnormal; Notable for the following:    Lymphocytes Relative 48 (*)    All other components within normal limits  COMPREHENSIVE METABOLIC PANEL - Abnormal; Notable for the following:    Sodium 132 (*)     Chloride 94 (*)    Glucose, Bld 549 (*)    Alkaline Phosphatase 138 (*)    All other components within normal limits  URINALYSIS, ROUTINE W REFLEX MICROSCOPIC - Abnormal; Notable for the following:    APPearance CLOUDY (*)    Specific Gravity, Urine <1.005 (*)    Glucose, UA >1000 (*)    Hgb urine dipstick MODERATE (*)    Leukocytes, UA SMALL (*)    All other components within normal limits  URINE MICROSCOPIC-ADD ON - Abnormal; Notable for the following:    Bacteria, UA FEW (*)    All other components within normal limits  CBG MONITORING, ED - Abnormal; Notable for the following:  Glucose-Capillary 513 (*)    All other components within normal limits  I-STAT VENOUS BLOOD GAS, ED - Abnormal; Notable for the following:    pH, Ven 7.371 (*)    pCO2, Ven 51.5 (*)    pO2, Ven 14.0 (*)    Bicarbonate 29.8 (*)    Acid-Base Excess 3.0 (*)    All other components within normal limits  CBG MONITORING, ED - Abnormal; Notable for the following:    Glucose-Capillary 409 (*)    All other components within normal limits  BLOOD GAS, VENOUS  CBG MONITORING, ED    Imaging Review No results found.   EKG Interpretation None      MDM   Final diagnoses:  Hyperglycemia  UTI (lower urinary tract infection)   49 year old female with hyperglycemia, polyuria, and polydipsia. She has been noncompliant with her insulin for the past several months.  On exam, patient in no acute distress. Abdominal exam is benign. Her mucous membranes do appear mildly dry. Lab work as above, glucose 549 without evidence of DKA. Patient was hydrated and given insulin in the ED with improvement of glucose to 225.  U/a also appears infection, will treat with keflex.  Refill of insulin given-- patient strongly encouraged to remain compliant with her DM meds to prevent kidney and other organ damage.  She will FU with her PCP.  Discussed plan with patient, he/she acknowledged understanding and agreed with plan of care.   Return precautions given for new or worsening symptoms.  YADHIRA HELDRETH, PA-C 10/10/14 2251  Jola Schmidt, MD 10/10/14 [phone]

## 2014-10-10 NOTE — Discharge Instructions (Signed)
Take the prescribed medication as directed. Please make sure to take your insulin regularly-- this is very important to prevent damage to your kidneys and other organs. Follow-up with your primary care physician. Return to the ED for new or worsening symptoms.

## 2014-10-10 NOTE — ED Notes (Signed)
Pt st's she had blood drawn at her MD's office and was called and told her blood sugar was greater than 600.  Pt st's she takes insulin but has not taken it in over a month

## 2014-10-10 NOTE — ED Notes (Signed)
Pt states that she takes insulin but has not been taking insulin at all for the last couple months. Pt states that she has her meds but just hasn't been taking them

## 2014-10-10 NOTE — Telephone Encounter (Signed)
Received a critical lab alert from lab. Patient's glucose was 603 at the time. Discussed value with Dr. Jenny Reichmann. He advised that the patient go the nearest ER for her elevated glucose level. I spoke with the patient directly on the phone and gave her her lab results. She agreed to go to the hospital as soon as her husband returned from the store. She did not feel comfortable going by herself. I will do a follow up call tomorrow. Patient reported that the only sxs of high blood sugar was that she was extremely thirsty the past few days.

## 2014-10-10 NOTE — ED Notes (Signed)
Pt discharged by [facility]

## 2014-10-11 ENCOUNTER — Other Ambulatory Visit: Payer: Self-pay | Admitting: Internal Medicine

## 2014-10-11 DIAGNOSIS — E119 Type 2 diabetes mellitus without complications: Secondary | ICD-10-CM

## 2014-10-11 LAB — HEMOGLOBIN A1C
Hgb A1c MFr Bld: 17.8 % — ABNORMAL HIGH (ref <5.7)
Mean Plasma Glucose: 464 mg/dL — ABNORMAL HIGH (ref <117)

## 2014-10-11 LAB — MICROALBUMIN / CREATININE URINE RATIO
Creatinine,U: 30.1 mg/dL
Microalb Creat Ratio: 47.2 mg/g — ABNORMAL HIGH (ref 0.0–30.0)
Microalb, Ur: 14.2 mg/dL — ABNORMAL HIGH (ref 0.0–1.9)

## 2014-10-11 MED ORDER — ATORVASTATIN CALCIUM 20 MG PO TABS: 20.0000 mg | ORAL_TABLET | Freq: Every day | ORAL | Status: DC

## 2014-10-12 NOTE — Assessment & Plan Note (Signed)
stable overall by history and exam, recent data reviewed with pt, and pt to continue medical treatment as before,  to f/u any worsening symptoms or concerns, for f/u a1c 

## 2014-10-12 NOTE — Assessment & Plan Note (Signed)
For INR r/o coagulopathy

## 2014-10-12 NOTE — Assessment & Plan Note (Signed)

## 2014-10-31 ENCOUNTER — Ambulatory Visit (INDEPENDENT_AMBULATORY_CARE_PROVIDER_SITE_OTHER): Payer: Managed Care, Other (non HMO) | Admitting: Endocrinology

## 2014-10-31 ENCOUNTER — Other Ambulatory Visit: Payer: Self-pay | Admitting: *Deleted

## 2014-10-31 ENCOUNTER — Encounter: Payer: Self-pay | Admitting: Endocrinology

## 2014-10-31 VITALS — BP 128/88 | HR 75 | Temp 98.0°F | Resp 14 | Ht 61.0 in | Wt 163.0 lb

## 2014-10-31 DIAGNOSIS — E1165 Type 2 diabetes mellitus with hyperglycemia: Secondary | ICD-10-CM

## 2014-10-31 DIAGNOSIS — I10 Essential (primary) hypertension: Secondary | ICD-10-CM

## 2014-10-31 DIAGNOSIS — IMO0002 Reserved for concepts with insufficient information to code with codable children: Secondary | ICD-10-CM

## 2014-10-31 LAB — POCT GLUCOSE (DEVICE FOR HOME USE): Glucose Fasting, POC: HIGH mg/dL (ref 70–99)

## 2014-10-31 MED ORDER — INSULIN REGULAR HUMAN (CONC) 500 UNIT/ML ~~LOC~~ SOLN: SUBCUTANEOUS | Status: DC

## 2014-10-31 MED ORDER — CANAGLIFLOZIN 100 MG PO TABS: ORAL_TABLET | ORAL | Status: DC

## 2014-10-31 MED ORDER — INSULIN PEN NEEDLE 32G X 4 MM MISC: Status: DC

## 2014-10-31 NOTE — Patient Instructions (Addendum)
U-500 insulin 18 units in am, 14 units before supper  Restart Invokana in am  Please check blood sugars at least half the time about 2 hours after any meal and 3 times per week on waking up. Please bring blood sugar monitor to each visit. Recommended blood sugar levels about 2 hours after meal is 140-180 and on waking up 90-130

## 2014-10-31 NOTE — Progress Notes (Signed)
Patient ID: [name] [name], female   DOB: 06/18/1966, 49 y.o.   MRN: [id number]    Reason for Appointment: Follow-up of diabetes  History of Present Illness            2. Diagnosis: Type 2 diabetes mellitus, date of diagnosis: 1998      Past history: Because of marked hyperglycemia at onset she was started on insulin at the time of diagnosis. She was on various insulin regimens and probably was taking premixed insulin for several years. She has been taking Lantus for about 18 months in increasing doses and also NovoLog insulin before meals She does not think her blood sugars have been in control at any time Her blood sugars have been persistently increased since 2014 at least with A1c around 14%  Recent history:  She has not been seen in follow-up since 11/2013 At that time she had just been started on U-500 insulin along with Lantus With this her blood sugars had started improving; also was probably benefiting from starting [place]  She has been poorly compliant with any follow-up and A1c has been at least 14% and recently almost 18% Current problems identified are as follows:  She stopped her medication 3 months ago thinking that she can control her diabetes with diet alone; has started back on low dose of U-500 only 2 weeks ago.  She does not think her medication is expired  She has not checked her blood sugar for at least 2 weeks since her monitor does not have a charger  She has been symptomatic with high blood sugars and excessively thirsty but has been trying to drink only water  She stopped her Invokana several months ago because of lay media publicity about side effects  She has lost a significant amount of weight with her high blood sugars  She has however been trying to walk more regularly for exercise  Diet has been mostly low in carbohydrates including at breakfast       Oral hypoglycemic drugs the patient is taking are: Invokana 100mg      Side effects from  medications have been: Diarrhea from metformin INSULIN regimen is described as:  U-500 insulin 8 units tid   Glucose monitoring:  not done recently        Glucometer:  One Touch Verio    Blood Glucose readings not available  Did not bring monitor today      Glycemic control:   Lab Results  Component Value Date   HGBA1C 17.8* 10/10/2014   HGBA1C 14.0* 01/11/2014   HGBA1C 14.0 08/13/2013   Lab Results  Component Value Date   MICROALBUR 14.2* 10/10/2014   LDLCALC 194* 10/10/2014   CREATININE 0.79 10/10/2014    Self-care: She is trying to watch her portions  Meals: 2-3 meals per day.  has eggs at breakfast at 10 am; will have some fried food; going to fast food restaurants about twice a week, for snacks she will have crackers, yogurt and apple     Exercise: walks daily and is somewhat active at work         Dietician visit: Most recent: 3/15.               Compliance with the medical regimen: Poor  Retinal exam: Most recent:1/15.    Weight history:160-180  Wt Readings from Last 3 Encounters:  10/31/14 163 lb (73.936 kg)  10/10/14 163 lb (73.936 kg)  10/09/14 165 lb (74.844 kg)  Medication List       This list is accurate as of: 10/31/14  4:41 PM.  Always use your most recent med list.               acetaminophen-codeine 300-30 MG per tablet  Commonly known as:  TYLENOL #3  Take 1 tablet by mouth every 4 (four) hours as needed.     atorvastatin 20 MG tablet  Commonly known as:  LIPITOR  Take 1 tablet (20 mg total) by mouth daily.     canagliflozin 100 MG Tabs tablet  Commonly known as:  INVOKANA  Take 1 tablet daily     cephALEXin 500 MG capsule  Commonly known as:  KEFLEX  Take 1 capsule (500 mg total) by mouth 3 (three) times daily.     diclofenac 75 MG EC tablet  Commonly known as:  VOLTAREN  Take 1 tablet (75 mg total) by mouth 2 (two) times daily.     HYDROcodone-acetaminophen 5-325 MG per tablet  Commonly known as:  NORCO/VICODIN  Take 1-2  tablets by mouth every 4 (four) hours as needed.     insulin regular human CONCENTRATED 500 UNIT/ML Soln injection  Commonly known as:  HUMULIN R  Inject 10 units at breakfast, 8 units at lunch, and 12 units at supper.  Inject 30 before each meal.     Linaclotide 290 MCG Caps capsule  Commonly known as:  LINZESS  Take 1 capsule (290 mcg total) by mouth daily.     losartan 100 MG tablet  Commonly known as:  COZAAR  Take 1 tablet (100 mg total) by mouth daily.     naproxen 500 MG tablet  Commonly known as:  NAPROSYN  Take 1 tablet (500 mg total) by mouth 2 (two) times daily with a meal.     OxyCODONE HCl (Abuse Deter) 5 MG Taba  Take 5 mg by mouth every 4 (four) hours as needed (pain).     pantoprazole 40 MG tablet  Commonly known as:  PROTONIX  Take 1 tablet (40 mg total) by mouth daily.     pregabalin 100 MG capsule  Commonly known as:  LYRICA  Take 1 capsule (100 mg total) by mouth 3 (three) times daily after meals.     sucralfate 1 GM/10ML suspension  Commonly known as:  CARAFATE  Take 10 mLs (1 g total) by mouth 4 (four) times daily -  with meals and at bedtime.     traMADol 50 MG tablet  Commonly known as:  ULTRAM  Take 1 tablet (50 mg total) by mouth every 6 (six) hours as needed.        Allergies:  Allergies  Allergen Reactions  . Tylenol [Acetaminophen] Itching    Past Medical History  Diagnosis Date  . Diabetes mellitus   . Hypertension     Past Surgical History  Procedure Laterality Date  . Abdominal hysterectomy    . Tubal ligation    . Tonsillectomy    . Cesarean section    . Ganglion cyst excision    . Eye surgery Left retinal  . Cholecystectomy      Family History  Problem Relation Age of Onset  . Diabetes Father   . Heart attack Father     Social History:  reports that she has never smoked. She has never used smokeless tobacco. She reports that she does not drink alcohol or use illicit drugs.    Review of Systems  Lipids:  She has a history of marked hypercholesterolemia and only recently has been started on 20 mg Lipitor from PCP   Lab Results  Component Value Date   CHOL 283* 10/10/2014   HDL 54.90 10/10/2014   LDLCALC 194* 10/10/2014   TRIG 169.0* 10/10/2014   CHOLHDL 5 10/10/2014  .     The blood pressure has been increased and she is on  losartan only.         She has a history of Numbness, and burning in feet for a couple of years. Also may have sharp pains,  relieved by taking Lyrica   Physical Examination:  BP 128/88 mmHg  Pulse 75  Temp(Src) 98 F (36.7 C)  Resp 14  Ht 5\' 1"  (1.549 m)  Wt 163 lb (73.936 kg)  BMI 30.81 kg/m2  SpO2 98%   No ankle edema l  ASSESSMENT/PLAN:    Diabetes type 2, uncontrolled with history of neuropathy  She has markedly increased blood sugars with A1c nearly 18% and glucose over 600 today She is not prone to ketosis However she has very poor understanding of diabetes and does not also realize that she has insulin deficiency in addition to insulin resistance  Since she is not responding to low doses of U-500 insulin will increase her dose  and for simplicity change it to twice a day since she has breakfast and lunch fairly close together New insulin doses will be 18 units in the morning and 14 before supper Also showed her how to use the U-500 insulin pen Also should benefit from restarting her Invokana and reassured her that it does not cause any significant side effects Advised her to watch her diet better with avoiding high-fat foods She was started back on home glucose monitoring, new One Touch Verio monitor given today She will follow-up in 2 weeks with record of home glucose  Hyperlipidemia: Has just been started on Lipitor by PCP  Counseling time over 50% of today's 25 minute visit  [name] [name] 10/31/2014, 4:41 PM

## 2014-11-20 ENCOUNTER — Other Ambulatory Visit: Payer: Self-pay | Admitting: *Deleted

## 2014-11-20 ENCOUNTER — Encounter: Payer: Self-pay | Admitting: Endocrinology

## 2014-11-20 ENCOUNTER — Ambulatory Visit (INDEPENDENT_AMBULATORY_CARE_PROVIDER_SITE_OTHER): Payer: Managed Care, Other (non HMO) | Admitting: Endocrinology

## 2014-11-20 VITALS — BP 130/88 | HR 73 | Temp 97.8°F | Resp 14 | Ht 61.0 in | Wt 172.4 lb

## 2014-11-20 DIAGNOSIS — E049 Nontoxic goiter, unspecified: Secondary | ICD-10-CM | POA: Diagnosis not present

## 2014-11-20 DIAGNOSIS — E1165 Type 2 diabetes mellitus with hyperglycemia: Secondary | ICD-10-CM

## 2014-11-20 DIAGNOSIS — I10 Essential (primary) hypertension: Secondary | ICD-10-CM

## 2014-11-20 DIAGNOSIS — IMO0002 Reserved for concepts with insufficient information to code with codable children: Secondary | ICD-10-CM

## 2014-11-20 MED ORDER — CANAGLIFLOZIN 100 MG PO TABS: ORAL_TABLET | ORAL | Status: DC

## 2014-11-20 MED ORDER — INSULIN GLARGINE 300 UNIT/ML ~~LOC~~ SOPN: 35.0000 [IU] | PEN_INJECTOR | Freq: Every day | SUBCUTANEOUS | Status: DC

## 2014-11-20 MED ORDER — FUROSEMIDE 20 MG PO TABS: 20.0000 mg | ORAL_TABLET | Freq: Every day | ORAL | Status: DC

## 2014-11-20 MED ORDER — GLUCOSE BLOOD VI STRP: ORAL_STRIP | Status: DC

## 2014-11-20 NOTE — Patient Instructions (Signed)
Check blood sugars on waking up ..  .. times a week Also check blood sugars about 2 hours after a meal and do this after different meals by rotation  Recommended blood sugar levels on waking up is 90-130 and about 2 hours after meal is 140-180 Please bring blood sugar monitor to each visit.  U-500 INSULIN: 20 units in am and 18 before supper  TOUJEO at night 30 units daily and go up 5 units every week till am sugar < 150

## 2014-11-20 NOTE — Progress Notes (Signed)
Patient ID: [name] [name], female   DOB: 05-28-1966, 49 y.o.   MRN: [id number]    Reason for Appointment: Follow-up of diabetes  History of Present Illness             Diagnosis: Type 2 diabetes mellitus, date of diagnosis: 1998      Past history: Because of marked hyperglycemia at onset she was started on insulin at the time of diagnosis. She was on various insulin regimens and probably was taking premixed insulin for several years. She has been taking Lantus for about 18 months in increasing doses and also NovoLog insulin before meals She does not think her blood sugars have been in control at any time Her blood sugars have been persistently increased since 2014 at least with A1c around 14%  Recent history:   INSULIN regimen is described as:  U-500 insulin 18 units a.m., 14 units after supper  She has been poorly compliant with any follow-up and A1c has been at least 14% and recently almost 18% When she came back for follow-up she was told to restart her U-500 insulin as above and continue diet and exercise program regularly  Current problems identified are as follows:  She did not bring her monitor for review.  However she thinks that her fasting blood sugars are still over 400.  She is not checking readings much during the daytime and either fasting or right after supper  She has not taken her insulin 30 minutes before meals and in the evening takes it after eating  For some reason she did not get Invokana from her drugstore.       Oral hypoglycemic drugs the patient is taking are: not on  Invokana 100mg      Side effects from medications have been: Diarrhea from metformin   Glucose monitoring:  twice a day recently        Glucometer:  One Touch Verio    Blood Glucose readings   PRE-MEAL Breakfast Lunch Dinner Bedtime Overall  Glucose range: 400+   230   Mean/median:        Did not bring monitor today      Glycemic control:   Lab Results  Component Value  Date   HGBA1C 17.8* 10/10/2014   HGBA1C 14.0* 01/11/2014   HGBA1C 14.0 08/13/2013   Lab Results  Component Value Date   MICROALBUR 14.2* 10/10/2014   LDLCALC 194* 10/10/2014   CREATININE 0.79 10/10/2014    Self-care: She is trying to watch her portions  Meals: 2-3 meals per day.  has eggs at breakfast at 10 am; will have some fried food; going to fast food restaurants about twice a week, for snacks she will have crackers, yogurt and apple     Exercise: walks daily and is somewhat active at work         Dietician visit: Most recent: 3/15.               Compliance with the medical regimen: Poor  Retinal exam: Most recent:1/15.    Weight history:160-180  Wt Readings from Last 3 Encounters:  11/20/14 172 lb 6.4 oz (78.2 kg)  10/31/14 163 lb (73.936 kg)  10/10/14 163 lb (73.936 kg)      Medication List       This list is accurate as of: 11/20/14  9:55 PM.  Always use your most recent med list.               acetaminophen-codeine 300-30 MG  per tablet  Commonly known as:  TYLENOL #3  Take 1 tablet by mouth every 4 (four) hours as needed.     atorvastatin 20 MG tablet  Commonly known as:  LIPITOR  Take 1 tablet (20 mg total) by mouth daily.     canagliflozin 100 MG Tabs tablet  Commonly known as:  INVOKANA  Take 1 tablet daily     furosemide 20 MG tablet  Commonly known as:  LASIX  Take 1 tablet (20 mg total) by mouth daily.     glucose blood test strip  Commonly known as:  ONETOUCH VERIO  Use as instructed to check blood sugar 3 times per day dx code E11.65     Insulin Glargine 300 UNIT/ML Sopn  Commonly known as:  TOUJEO SOLOSTAR  Inject 35 Units into the skin at bedtime.     Insulin Pen Needle 32G X 4 MM Misc  Commonly known as:  BD PEN NEEDLE NANO U/F  Use to inject insulin 2 times per day     insulin regular human CONCENTRATED 500 UNIT/ML Soln injection  Commonly known as:  HUMULIN R U-500 KWIKPEN  Inject 18 units in the morning and 14 units in the  evening     Linaclotide 290 MCG Caps capsule  Commonly known as:  LINZESS  Take 1 capsule (290 mcg total) by mouth daily.     losartan 100 MG tablet  Commonly known as:  COZAAR  Take 1 tablet (100 mg total) by mouth daily.     pantoprazole 40 MG tablet  Commonly known as:  PROTONIX  Take 1 tablet (40 mg total) by mouth daily.     pregabalin 100 MG capsule  Commonly known as:  LYRICA  Take 1 capsule (100 mg total) by mouth 3 (three) times daily after meals.     sucralfate 1 GM/10ML suspension  Commonly known as:  CARAFATE  Take 10 mLs (1 g total) by mouth 4 (four) times daily -  with meals and at bedtime.     traMADol 50 MG tablet  Commonly known as:  ULTRAM  Take 1 tablet (50 mg total) by mouth every 6 (six) hours as needed.        Allergies:  Allergies  Allergen Reactions  . Tylenol [Acetaminophen] Itching    Past Medical History  Diagnosis Date  . Diabetes mellitus   . Hypertension     Past Surgical History  Procedure Laterality Date  . Abdominal hysterectomy    . Tubal ligation    . Tonsillectomy    . Cesarean section    . Ganglion cyst excision    . Eye surgery Left retinal  . Cholecystectomy      Family History  Problem Relation Age of Onset  . Diabetes Father   . Heart attack Father     Social History:  reports that she has never smoked. She has never used smokeless tobacco. She reports that she does not drink alcohol or use illicit drugs.    Review of Systems        Lipids: She has a history of marked hypercholesterolemia and has been started on 20 mg Lipitor from PCP   Lab Results  Component Value Date   CHOL 283* 10/10/2014   HDL 54.90 10/10/2014   LDLCALC 194* 10/10/2014   TRIG 169.0* 10/10/2014   CHOLHDL 5 10/10/2014  .     The blood pressure has been increased and she is on  losartan only and blood pressure  still seems high.  She thinks that she is swelling in her ankles         She has a history of Numbness, and burning in  feet for a couple of years. Also may have sharp pains,  relieved by taking Lyrica qd   Physical Examination:  BP 130/88 mmHg  Pulse 73  Temp(Src) 97.8 F (36.6 C)  Resp 14  Ht 5\' 1"  (1.549 m)  Wt 172 lb 6.4 oz (78.2 kg)  BMI 32.59 kg/m2  SpO2 98%  1+ lower leg edema   ASSESSMENT/PLAN:    Diabetes type 2, uncontrolled with history of neuropathy  She has persistently high blood sugars even with taking 32 units of U-500 insulin daily She has in the past been able to lose weight but this may have been partly related to her persistent severe hyperglycemia She does not understand timing of U-500 insulin and is taking it postprandially at night at least Also not checking her blood sugars as directed and only some before breakfast and bedtime  Most likely will need to add a basal insulin to her U-500 insulin Although she may do well with Lantus she could probably have better control with Toujeo with longer duration of action Discussed how this works and differences between this and Lantus as well as timing of the insulin Also discussed titrating the dose by 5 units every week to get the morning sugar down; she can start with 30 units tonight Co-pay card and patient brochure given  Also will need to restart Invokana to help improve her blood sugar control and minimize weight gain and edema. Meanwhile will increase her U-500 insulin to 20 units in the morning and 18 before supper and discussed timing of taking this   Patient Instructions  Check blood sugars on waking up ..  .. times a week Also check blood sugars about 2 hours after a meal and do this after different meals by rotation  Recommended blood sugar levels on waking up is 90-130 and about 2 hours after meal is 140-180 Please bring blood sugar monitor to each visit.  U-500 INSULIN: 20 units in am and 18 before supper  TOUJEO at night 30 units daily and go up 5 units every week till am sugar < 150      Counseling time  on subjects discussed above is over 50% of today's 25 minute visit   Araeya Lamb 11/20/2014, 9:55 PM

## 2014-11-28 ENCOUNTER — Encounter: Payer: Self-pay | Admitting: Endocrinology

## 2014-11-28 ENCOUNTER — Ambulatory Visit (INDEPENDENT_AMBULATORY_CARE_PROVIDER_SITE_OTHER): Payer: Managed Care, Other (non HMO) | Admitting: Endocrinology

## 2014-11-28 VITALS — BP 128/84 | HR 77 | Temp 98.1°F | Resp 16 | Ht 61.0 in | Wt 183.0 lb

## 2014-11-28 DIAGNOSIS — R609 Edema, unspecified: Secondary | ICD-10-CM

## 2014-11-28 DIAGNOSIS — R1032 Left lower quadrant pain: Secondary | ICD-10-CM

## 2014-11-28 DIAGNOSIS — E1165 Type 2 diabetes mellitus with hyperglycemia: Secondary | ICD-10-CM | POA: Diagnosis not present

## 2014-11-28 DIAGNOSIS — IMO0002 Reserved for concepts with insufficient information to code with codable children: Secondary | ICD-10-CM

## 2014-11-28 LAB — POCT URINALYSIS DIPSTICK
Bilirubin, UA: NEGATIVE
Glucose, UA: 2000
Ketones, UA: NEGATIVE
Nitrite, UA: NEGATIVE
Spec Grav, UA: <=1.005
Urobilinogen, UA: 0.2
pH, UA: 6

## 2014-11-28 MED ORDER — FUROSEMIDE 40 MG PO TABS: 60.0000 mg | ORAL_TABLET | Freq: Every day | ORAL | Status: DC

## 2014-11-28 NOTE — Progress Notes (Signed)
Patient ID: [name] [name], female   DOB: [birth year], 49 y.o.   MRN: [id number]    Reason for Appointment: Leg swelling and Follow-up of diabetes  History of Present Illness    Problem 1  She says that last few days she has had increasing swelling of her legs as well as she feels the swelling in her abdomen andalso She has gained 13 pounds This is despite her starting Invokana 100 mg daily as well as Lasix 20 mg daily on the last visit She had been taking Lyrica previously and this was restarted earlier this month by PCP for her neuropathy No shortness of breath          PROBLEM 2: Type 2 diabetes mellitus, date of diagnosis: 1998      Past history: Because of marked hyperglycemia at onset she was started on insulin at the time of diagnosis. She was on various insulin regimens and probably was taking premixed insulin for several years. She has been taking Lantus for about 18 months in increasing doses and also NovoLog insulin before meals She does not think her blood sugars have been in control at any time Her blood sugars have been persistently increased since 2014 at least with A1c around 14% She has been poorly compliant with follow-up and A1c has been at least 14% and recently almost 18%  Recent history:   INSULIN regimen is described as:  U-500 insulin 18 units a.m., 14 units after supper, Toujeo 30 units at night  When she came back for follow-up she was told to restart her U-500 insulin as above  Because of persistently high readings especially in the mornings with some readings over 400 she was started on Toujeo 30 units in the evening Also she was told to take her pre-meal insulin before eating consistently in the evening instead of after She thinks her blood sugars are better  Current blood sugar patterns and problems identified are as follows:  She did not bring her monitor for review.  However she thinks that her fasting blood sugars are much better and  probably the best of the day, did not increase the dose of Toujeo since it was started  She felt hypoglycemic once when her blood sugar was around 80 in the morning  Blood sugars tend to be higher during the day but not significantly higher after supper consistently  She is doing better with taking her evening dose before supper       Oral hypoglycemic drugs the patient is taking are: on  Invokana 100mg      Side effects from medications have been: Diarrhea from metformin   Glucose monitoring:  twice a day recently        Glucometer:  One Touch Verio    Blood Glucose readings by recall  PRE-MEAL Breakfast Lunch Dinner Bedtime Overall  Glucose range: 120  180-200   200  180   Mean/median:            Glycemic control:   Lab Results  Component Value Date   HGBA1C 17.8* 10/10/2014   HGBA1C 14.0* 01/11/2014   HGBA1C 14.0 08/13/2013   Lab Results  Component Value Date   MICROALBUR 14.2* 10/10/2014   LDLCALC 194* 10/10/2014   CREATININE 0.79 10/10/2014    Self-care: She is trying to watch her portions  Meals: 2-3 meals per day.  has eggs at breakfast at 10 am; will have some fried food; going to fast food restaurants about twice a  week, for snacks she will have crackers, yogurt and apple     Exercise: walks daily and is somewhat active at work         Dietician visit: Most recent: 3/15.               Compliance with the medical regimen: Poor  Retinal exam: Most recent:1/15.    Weight history:160-180  Wt Readings from Last 3 Encounters:  11/28/14 183 lb (83.008 kg)  11/20/14 172 lb 6.4 oz (78.2 kg)  10/31/14 163 lb (73.936 kg)    Problem 3: Abdominal pain as detailed in review of systems     Medication List       This list is accurate as of: 11/28/14  4:53 PM.  Always use your most recent med list.               acetaminophen-codeine 300-30 MG per tablet  Commonly known as:  TYLENOL #3  Take 1 tablet by mouth every 4 (four) hours as needed.     atorvastatin 20  MG tablet  Commonly known as:  LIPITOR  Take 1 tablet (20 mg total) by mouth daily.     canagliflozin 100 MG Tabs tablet  Commonly known as:  INVOKANA  Take 1 tablet daily     furosemide 40 MG tablet  Commonly known as:  LASIX  Take 1.5 tablets (60 mg total) by mouth daily.     glucose blood test strip  Commonly known as:  ONETOUCH VERIO  Use as instructed to check blood sugar 3 times per day dx code E11.65     Insulin Glargine 300 UNIT/ML Sopn  Commonly known as:  TOUJEO SOLOSTAR  Inject 35 Units into the skin at bedtime.     Insulin Pen Needle 32G X 4 MM Misc  Commonly known as:  BD PEN NEEDLE NANO U/F  Use to inject insulin 2 times per day     insulin regular human CONCENTRATED 500 UNIT/ML Soln injection  Commonly known as:  HUMULIN R U-500 KWIKPEN  Inject 18 units in the morning and 14 units in the evening     Linaclotide 290 MCG Caps capsule  Commonly known as:  LINZESS  Take 1 capsule (290 mcg total) by mouth daily.     losartan 100 MG tablet  Commonly known as:  COZAAR  Take 1 tablet (100 mg total) by mouth daily.     pantoprazole 40 MG tablet  Commonly known as:  PROTONIX  Take 1 tablet (40 mg total) by mouth daily.     pregabalin 100 MG capsule  Commonly known as:  LYRICA  Take 1 capsule (100 mg total) by mouth 3 (three) times daily after meals.     sucralfate 1 GM/10ML suspension  Commonly known as:  CARAFATE  Take 10 mLs (1 g total) by mouth 4 (four) times daily -  with meals and at bedtime.     traMADol 50 MG tablet  Commonly known as:  ULTRAM  Take 1 tablet (50 mg total) by mouth every 6 (six) hours as needed.        Allergies:  Allergies  Allergen Reactions  . Tylenol [Acetaminophen] Itching    Past Medical History  Diagnosis Date  . Diabetes mellitus   . Hypertension     Past Surgical History  Procedure Laterality Date  . Abdominal hysterectomy    . Tubal ligation    . Tonsillectomy    . Cesarean section    . Ganglion  cyst  excision    . Eye surgery Left retinal  . Cholecystectomy      Family History  Problem Relation Age of Onset  . Diabetes Father   . Heart attack Father     Social History:  reports that she has never smoked. She has never used smokeless tobacco. She reports that she does not drink alcohol or use illicit drugs.    Review of Systems   She is having some sharp pains in her left abdomen in the last 3-4 days.  It starts in the lower abdomen near the middle and goes up towards her flank.  It tends to last about 5 minutes and feels like a stabbing sensation She does not have any associated rash, urinary symptoms or change in bowel habits, has had some nausea .      Lipids: She has a history of marked hypercholesterolemia and has been started on 20 mg Lipitor from PCP   Lab Results  Component Value Date   CHOL 283* 10/10/2014   HDL 54.90 10/10/2014   LDLCALC 194* 10/10/2014   TRIG 169.0* 10/10/2014   CHOLHDL 5 10/10/2014  .     The blood pressure has been increased and she is on  losartan          She has a history of Numbness, and burning in feet and legs for a couple of years. Also may have sharp pains,  relieved by taking Lyrica which she has been taking long-term and restarted more recently     Physical Examination:  BP 128/84 mmHg  Pulse 77  Temp(Src) 98.1 F (36.7 C)  Resp 16  Ht 5\' 1"  (1.549 m)  Wt 183 lb (83.008 kg)  BMI 34.60 kg/m2  SpO2 97%  Minimal left flank tenderness but no left lower quadrant abdominal tenderness No skin lesions on the abdomen or flank Bowel sounds are normal and abdomen is soft  3+ lower leg edema   ASSESSMENT/PLAN:    Diabetes type 2, uncontrolled with history of neuropathy  She has  improved blood sugars with adding a basal insulin to her U-500 insulin as well as starting on Invokana low-dose She is having relatively high postprandial readings  However she is having marked edema and weight gain probably related to markedly  improved blood sugars along with taking drugs like Lyrica at the same time This is despite her taking 20 mg Lasix and 100 mg Invokana  For now will try to not get her sugars as tightly controlled She will reduce her Toujeo by 10 units and increase her Invokana to 200 mg  EDEMA: She will increase her Lasix to 60 mg; we'll also check her renal function and electrolytes today  Left lower quadrant intermittent radicular pain: 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 Will also check urinalysis and CBC today, she will see her PCP tomorrow if not better  NEUROPATHY: Consider adding Cymbalta for neuropathy but hold off since she is having some nausea currently; she is not able to reduce her Lyrica at this time   Patient Instructions  Reduce lantus to 20 units  Increase U-500 to 20 units am and keep 14 at supper  Take 2 Invokana in am before meal  Lasix 3 pills in am daily  Bring meter      Biridiana Twardowski 11/28/2014, 4:53 PM   Addendum: Urine dipstick shows moderate to large blood, will send urinalysis and referred to PCP

## 2014-11-28 NOTE — Patient Instructions (Addendum)
Reduce lantus to 20 units  Increase U-500 to 20 units am and keep 14 at supper  Take 2 Invokana in am before meal  Lasix 3 pills in am daily  Bring meter

## 2014-11-29 LAB — CBC
HCT: 36 % (ref 36.0–46.0)
Hemoglobin: 11.7 g/dL — ABNORMAL LOW (ref 12.0–15.0)
MCHC: 32.6 g/dL (ref 30.0–36.0)
MCV: 83.4 fl (ref 78.0–100.0)
Platelets: 195 10*3/uL (ref 150.0–400.0)
RBC: 4.32 Mil/uL (ref 3.87–5.11)
RDW: 13.5 % (ref 11.5–15.5)
WBC: 5.8 10*3/uL (ref 4.0–10.5)

## 2014-11-29 LAB — URINALYSIS, ROUTINE W REFLEX MICROSCOPIC
Bilirubin Urine: NEGATIVE
Ketones, ur: NEGATIVE
Nitrite: NEGATIVE
Specific Gravity, Urine: 1.015 (ref 1.000–1.030)
Total Protein, Urine: 30 — AB
Urine Glucose: >=1000 — AB
Urobilinogen, UA: 0.2 (ref 0.0–1.0)
pH: 5.5 (ref 5.0–8.0)

## 2014-11-29 LAB — BASIC METABOLIC PANEL
BUN: 16 mg/dL (ref 6–23)
CO2: 30 mEq/L (ref 19–32)
Calcium: 8.6 mg/dL (ref 8.4–10.5)
Chloride: 98 mEq/L (ref 96–112)
Creatinine, Ser: 0.8 mg/dL (ref 0.40–1.20)
GFR: 98.31 mL/min (ref >60.00)
Glucose, Bld: 356 mg/dL — ABNORMAL HIGH (ref 70–99)
Potassium: 3.9 mEq/L (ref 3.5–5.1)
Sodium: 133 mEq/L — ABNORMAL LOW (ref 135–145)

## 2014-11-29 NOTE — Progress Notes (Signed)
Quick Note:  Please let patient know that white blood counts are okay, urine still shows some residual infection, needs to discuss with PCP. Glucose was 356 ______

## 2014-12-05 ENCOUNTER — Encounter: Payer: Self-pay | Admitting: *Deleted

## 2014-12-05 ENCOUNTER — Telehealth: Payer: Self-pay | Admitting: Endocrinology

## 2014-12-05 NOTE — Telephone Encounter (Signed)
Patient stated that she got a phone call, wondering if it was from you.

## 2014-12-19 ENCOUNTER — Ambulatory Visit: Payer: Self-pay | Admitting: Endocrinology

## 2014-12-19 DIAGNOSIS — Z0289 Encounter for other administrative examinations: Secondary | ICD-10-CM

## 2014-12-20 ENCOUNTER — Other Ambulatory Visit: Payer: Self-pay

## 2014-12-20 MED ORDER — PREGABALIN 100 MG PO CAPS: 100.0000 mg | ORAL_CAPSULE | Freq: Three times a day (TID) | ORAL | Status: DC

## 2014-12-20 NOTE — Telephone Encounter (Signed)
Done hardcopy to dahlia/LIM B  

## 2014-12-20 NOTE — Telephone Encounter (Signed)
Rx faxed to pharmacy  

## 2014-12-26 ENCOUNTER — Other Ambulatory Visit: Payer: Self-pay | Admitting: Endocrinology

## 2014-12-31 ENCOUNTER — Telehealth: Payer: Self-pay | Admitting: *Deleted

## 2014-12-31 NOTE — Telephone Encounter (Signed)
Patients voice mail is full and cannot accept any messages

## 2015-01-01 ENCOUNTER — Emergency Department (HOSPITAL_COMMUNITY)
Admission: EM | Admit: 2015-01-01 | Discharge: 2015-01-01 | Disposition: A | Discharge status: Home / Self Care | Payer: Self-pay | Attending: Emergency Medicine | Admitting: Emergency Medicine

## 2015-01-01 ENCOUNTER — Emergency Department (HOSPITAL_COMMUNITY): Payer: Managed Care, Other (non HMO)

## 2015-01-01 ENCOUNTER — Encounter (HOSPITAL_COMMUNITY): Payer: Self-pay | Admitting: Emergency Medicine

## 2015-01-01 DIAGNOSIS — R011 Cardiac murmur, unspecified: Secondary | ICD-10-CM | POA: Insufficient documentation

## 2015-01-01 DIAGNOSIS — R05 Cough: Secondary | ICD-10-CM | POA: Insufficient documentation

## 2015-01-01 DIAGNOSIS — E119 Type 2 diabetes mellitus without complications: Secondary | ICD-10-CM | POA: Insufficient documentation

## 2015-01-01 DIAGNOSIS — R531 Weakness: Secondary | ICD-10-CM | POA: Insufficient documentation

## 2015-01-01 DIAGNOSIS — Z794 Long term (current) use of insulin: Secondary | ICD-10-CM | POA: Insufficient documentation

## 2015-01-01 DIAGNOSIS — I10 Essential (primary) hypertension: Secondary | ICD-10-CM | POA: Insufficient documentation

## 2015-01-01 DIAGNOSIS — R079 Chest pain, unspecified: Secondary | ICD-10-CM | POA: Insufficient documentation

## 2015-01-01 DIAGNOSIS — Z79899 Other long term (current) drug therapy: Secondary | ICD-10-CM | POA: Insufficient documentation

## 2015-01-01 DIAGNOSIS — R5383 Other fatigue: Secondary | ICD-10-CM | POA: Insufficient documentation

## 2015-01-01 HISTORY — DX: Cardiac murmur, unspecified: R01.1

## 2015-01-01 LAB — BRAIN NATRIURETIC PEPTIDE: B Natriuretic Peptide: 16.9 pg/mL (ref 0.0–100.0)

## 2015-01-01 LAB — BASIC METABOLIC PANEL
Anion gap: 11 (ref 5–15)
BUN: 26 mg/dL — ABNORMAL HIGH (ref 6–20)
CO2: 27 mmol/L (ref 22–32)
Calcium: 8.8 mg/dL — ABNORMAL LOW (ref 8.9–10.3)
Chloride: 93 mmol/L — ABNORMAL LOW (ref 101–111)
Creatinine, Ser: 0.95 mg/dL (ref 0.44–1.00)
GFR calc Af Amer: >60 mL/min (ref >60)
GFR calc non Af Amer: >60 mL/min (ref >60)
Glucose, Bld: 593 mg/dL (ref 65–99)
Potassium: 4.3 mmol/L (ref 3.5–5.1)
Sodium: 131 mmol/L — ABNORMAL LOW (ref 135–145)

## 2015-01-01 LAB — CBC
HCT: 38.4 % (ref 36.0–46.0)
Hemoglobin: 13 g/dL (ref 12.0–15.0)
MCH: 27.1 pg (ref 26.0–34.0)
MCHC: 33.9 g/dL (ref 30.0–36.0)
MCV: 80.2 fL (ref 78.0–100.0)
Platelets: 182 10*3/uL (ref 150–400)
RBC: 4.79 MIL/uL (ref 3.87–5.11)
RDW: 12.8 % (ref 11.5–15.5)
WBC: 6.5 10*3/uL (ref 4.0–10.5)

## 2015-01-01 LAB — I-STAT TROPONIN, ED: Troponin i, poc: 0 ng/mL (ref 0.00–0.08)

## 2015-01-01 LAB — CBG MONITORING, ED: Glucose-Capillary: 216 mg/dL — ABNORMAL HIGH (ref 65–99)

## 2015-01-01 MED ORDER — SODIUM CHLORIDE 0.9 % IV BOLUS (SEPSIS)
1000.0000 mL | Freq: Once | INTRAVENOUS | Status: AC
  Administered 2015-01-01: 1000 mL via INTRAVENOUS

## 2015-01-01 MED ORDER — INSULIN ASPART 100 UNIT/ML IV SOLN
10.0000 [IU] | Freq: Once | INTRAVENOUS | Status: AC
  Administered 2015-01-01: 10 [IU] via INTRAVENOUS
  Filled 2015-01-01: qty 1

## 2015-01-01 NOTE — Discharge Instructions (Signed)
Chest Pain (Nonspecific) °It is often hard to give a specific diagnosis for the cause of chest pain. There is always a chance that your pain could be related to something serious, such as a heart attack or a blood clot in the lungs. You need to follow up with your health care provider for further evaluation. °CAUSES  °· Heartburn. °· Pneumonia or bronchitis. °· Anxiety or stress. °· Inflammation around your heart (pericarditis) or lung (pleuritis or pleurisy). °· A blood clot in the lung. °· A collapsed lung (pneumothorax). It can develop suddenly on its own (spontaneous pneumothorax) or from trauma to the chest. °· Shingles infection (herpes zoster virus). °The chest wall is composed of bones, muscles, and cartilage. Any of these can be the source of the pain. °· The bones can be bruised by injury. °· The muscles or cartilage can be strained by coughing or overwork. °· The cartilage can be affected by inflammation and become sore (costochondritis). °DIAGNOSIS  °Lab tests or other studies may be needed to find the cause of your pain. Your health care provider may have you take a test called an ambulatory electrocardiogram (ECG). An ECG records your heartbeat patterns over a 24-hour period. You may also have other tests, such as: °· Transthoracic echocardiogram (TTE). During echocardiography, sound waves are used to evaluate how blood flows through your heart. °· Transesophageal echocardiogram (TEE). °· Cardiac monitoring. This allows your health care provider to monitor your heart rate and rhythm in real time. °· Holter monitor. This is a portable device that records your heartbeat and can help diagnose heart arrhythmias. It allows your health care provider to track your heart activity for several days, if needed. °· Stress tests by exercise or by giving medicine that makes the heart beat faster. °TREATMENT  °· Treatment depends on what may be causing your chest pain. Treatment may include: °· Acid blockers for  heartburn. °· Anti-inflammatory medicine. °· Pain medicine for inflammatory conditions. °· Antibiotics if an infection is present. °· You may be advised to change lifestyle habits. This includes stopping smoking and avoiding alcohol, caffeine, and chocolate. °· You may be advised to keep your head raised (elevated) when sleeping. This reduces the chance of acid going backward from your stomach into your esophagus. °Most of the time, nonspecific chest pain will improve within 2-3 days with rest and mild pain medicine.  °HOME CARE INSTRUCTIONS  °· If antibiotics were prescribed, take them as directed. Finish them even if you start to feel better. °· For the next few days, avoid physical activities that bring on chest pain. Continue physical activities as directed. °· Do not use any tobacco products, including cigarettes, chewing tobacco, or electronic cigarettes. °· Avoid drinking alcohol. °· Only take medicine as directed by your health care provider. °· Follow your health care provider's suggestions for further testing if your chest pain does not go away. °· Keep any follow-up appointments you made. If you do not go to an appointment, you could develop lasting (chronic) problems with pain. If there is any problem keeping an appointment, call to reschedule. °SEEK MEDICAL CARE IF:  °· Your chest pain does not go away, even after treatment. °· You have a rash with blisters on your chest. °· You have a fever. °SEEK IMMEDIATE MEDICAL CARE IF:  °· You have increased chest pain or pain that spreads to your arm, neck, jaw, back, or abdomen. °· You have shortness of breath. °· You have an increasing cough, or you cough   up blood.  You have severe back or abdominal pain.  You feel nauseous or vomit.  You have severe weakness.  You faint.  You have chills. This is an emergency. Do not wait to see if the pain will go away. Get medical help at once. Call your local emergency services (911 in [place] Do not drive  yourself to the hospital. MAKE SURE YOU:   Understand these instructions.  Will watch your condition.  Will get help right away if you are not doing well or get worse. Document Released: 03/31/2005 Document Revised: 06/26/2013 Document Reviewed: 01/25/2008 [facility] Patient Information 2015 [place], [place]. This information is not intended to replace advice given to you by your health care provider. Make sure you discuss any questions you have with your health care provider. Fatigue Fatigue is a feeling of tiredness, lack of energy, lack of motivation, or feeling tired all the time. Having enough rest, good nutrition, and reducing stress will normally reduce fatigue. Consult your caregiver if it persists. The nature of your fatigue will help your caregiver to find out its cause. The treatment is based on the cause.  CAUSES  There are many causes for fatigue. Most of the time, fatigue can be traced to one or more of your habits or routines. Most causes fit into one or more of three general areas. They are: Lifestyle problems  Sleep disturbances.  Overwork.  Physical exertion.  Unhealthy habits.  Poor eating habits or eating disorders.  Alcohol and/or drug use .  Lack of proper nutrition (malnutrition). Psychological problems  Stress and/or anxiety problems.  Depression.  Grief.  Boredom. Medical Problems or Conditions  Anemia.  Pregnancy.  Thyroid gland problems.  Recovery from major surgery.  Continuous pain.  Emphysema or asthma that is not well controlled  Allergic conditions.  Diabetes.  Infections (such as mononucleosis).  Obesity.  Sleep disorders, such as sleep apnea.  Heart failure or other heart-related problems.  Cancer.  Kidney disease.  Liver disease.  Effects of certain medicines such as antihistamines, cough and cold remedies, prescription pain medicines, heart and blood pressure medicines, drugs used for treatment of cancer, and some  antidepressants. SYMPTOMS  The symptoms of fatigue include:   Lack of energy.  Lack of drive (motivation).  Drowsiness.  Feeling of indifference to the surroundings. DIAGNOSIS  The details of how you feel help guide your caregiver in finding out what is causing the fatigue. You will be asked about your present and past health condition. It is important to review all medicines that you take, including prescription and non-prescription items. A thorough exam will be done. You will be questioned about your feelings, habits, and normal lifestyle. Your caregiver may suggest blood tests, urine tests, or other tests to look for common medical causes of fatigue.  TREATMENT  Fatigue is treated by correcting the underlying cause. For example, if you have continuous pain or depression, treating these causes will improve how you feel. Similarly, adjusting the dose of certain medicines will help in reducing fatigue.  HOME CARE INSTRUCTIONS   Try to get the required amount of good sleep every night.  Eat a healthy and nutritious diet, and drink enough water throughout the day.  Practice ways of relaxing (including yoga or meditation).  Exercise regularly.  Make plans to change situations that cause stress. Act on those plans so that stresses decrease over time. Keep your work and personal routine reasonable.  Avoid street drugs and minimize use of alcohol.  Start taking  a daily multivitamin after consulting your caregiver. SEEK MEDICAL CARE IF:   You have persistent tiredness, which cannot be accounted for.  You have fever.  You have unintentional weight loss.  You have headaches.  You have disturbed sleep throughout the night.  You are feeling sad.  You have constipation.  You have dry skin.  You have gained weight.  You are taking any new or different medicines that you suspect are causing fatigue.  You are unable to sleep at night.  You develop any unusual swelling of your  legs or other parts of your body. SEEK IMMEDIATE MEDICAL CARE IF:   You are feeling confused.  Your vision is blurred.  You feel faint or pass out.  You develop severe headache.  You develop severe abdominal, pelvic, or back pain.  You develop chest pain, shortness of breath, or an irregular or fast heartbeat.  You are unable to pass a normal amount of urine.  You develop abnormal bleeding such as bleeding from the rectum or you vomit blood.  You have thoughts about harming yourself or committing suicide.  You are worried that you might harm someone else. MAKE SURE YOU:   Understand these instructions.  Will watch your condition.  Will get help right away if you are not doing well or get worse. Document Released: 04/18/2007 Document Revised: 09/13/2011 Document Reviewed: 10/23/2013 [facility] Patient Information 2015 [place], [place]. This information is not intended to replace advice given to you by your health care provider. Make sure you discuss any questions you have with your health care provider.

## 2015-01-01 NOTE — ED Provider Notes (Signed)
CSN: [id number]     Arrival date & time 01/01/15  1228 History   First MD Initiated Contact with Patient 01/01/15 1418     Chief Complaint  Patient presents with  . Chest Pain     (Consider location/radiation/quality/duration/timing/severity/associated sxs/prior Treatment) Patient is a 49 y.o. female presenting with general illness.  Illness Location:  Generalized Quality:  Weakness, chest heaviness Severity:  Moderate Onset quality:  Gradual Duration:  3 days Timing:  Constant Progression:  Worsening Chronicity:  New Context:  DM Relieved by:  Nothing Worsened by:  Nothing Associated symptoms: chest pain (intermittently over last 3 weeks) and cough   Associated symptoms: no fever, no nausea, no rash, no rhinorrhea, no shortness of breath and no vomiting     Past Medical History  Diagnosis Date  . Diabetes mellitus   . Hypertension   . Heart murmur    Past Surgical History  Procedure Laterality Date  . Abdominal hysterectomy    . Tubal ligation    . Tonsillectomy    . Cesarean section    . Ganglion cyst excision    . Eye surgery Left retinal  . Cholecystectomy     Family History  Problem Relation Age of Onset  . Diabetes Father   . Heart attack Father    History  Substance Use Topics  . Smoking status: Never Smoker   . Smokeless tobacco: Never Used  . Alcohol Use: No   OB History    No data available     Review of Systems  Constitutional: Negative for fever.  HENT: Negative for rhinorrhea.   Respiratory: Positive for cough. Negative for shortness of breath.   Cardiovascular: Positive for chest pain (intermittently over last 3 weeks).  Gastrointestinal: Negative for nausea and vomiting.  Skin: Negative for rash.  All other systems reviewed and are negative.     Allergies  Tylenol  Home Medications   Prior to Admission medications   Medication Sig Start Date End Date Taking? Authorizing Provider  atorvastatin (LIPITOR) 20 MG tablet Take 1  tablet (20 mg total) by mouth daily. 10/11/14  Yes Biagio Borg, MD  canagliflozin [facility]) 100 MG TABS tablet Take 1 tablet daily 11/20/14  Yes Elayne Snare, MD  furosemide (LASIX) 20 MG tablet Take 20 mg by mouth daily.  11/20/14  Yes Historical Provider, MD  glucose blood (ONETOUCH VERIO) test strip Use as instructed to check blood sugar 3 times per day dx code E11.65 11/20/14  Yes Elayne Snare, MD  Insulin Glargine (TOUJEO SOLOSTAR) 300 UNIT/ML SOPN Inject 35 Units into the skin at bedtime. 11/20/14  Yes Elayne Snare, MD  Insulin Pen Needle (BD PEN NEEDLE NANO U/F) 32G X 4 MM MISC Use to inject insulin 2 times per day 10/31/14  Yes Elayne Snare, MD  insulin regular human CONCENTRATED (HUMULIN R U-500 KWIKPEN) 500 UNIT/ML SOLN injection Inject 18 units in the morning and 14 units in the evening 10/31/14  Yes Elayne Snare, MD  losartan (COZAAR) 100 MG tablet Take 1 tablet (100 mg total) by mouth daily. 10/09/14  Yes Biagio Borg, MD  pregabalin (LYRICA) 100 MG capsule Take 1 capsule (100 mg total) by mouth 3 (three) times daily after meals. 12/20/14  Yes Biagio Borg, MD  vitamin B-12 (CYANOCOBALAMIN) 1000 MCG tablet Take 1,000 mcg by mouth 2 (two) times daily.   Yes Historical Provider, MD  acetaminophen-codeine (TYLENOL #3) 300-30 MG per tablet Take 1 tablet by mouth every 4 (four) hours as  needed. Patient not taking: Reported on 01/01/2015 10/12/13   Tresa Garter, MD  furosemide (LASIX) 40 MG tablet TAKE 1 AND 1/2 TABLETS(60 MG) BY MOUTH DAILY Patient not taking: Reported on 01/01/2015 12/26/14   Elayne Snare, MD  Linaclotide [facility]) 290 MCG CAPS capsule Take 1 capsule (290 mcg total) by mouth daily. Patient not taking: Reported on 01/01/2015 01/11/14   Biagio Borg, MD  pantoprazole (PROTONIX) 40 MG tablet Take 1 tablet (40 mg total) by mouth daily. Patient not taking: Reported on 01/01/2015 01/11/14   Biagio Borg, MD  sucralfate (CARAFATE) 1 GM/10ML suspension Take 10 mLs (1 g total) by mouth 4 (four) times  daily -  with meals and at bedtime. Patient not taking: Reported on 01/01/2015 12/15/13   Hazel Sams, PA-C  traMADol (ULTRAM) 50 MG tablet Take 1 tablet (50 mg total) by mouth every 6 (six) hours as needed. Patient not taking: Reported on 01/01/2015 11/21/13   Waldemar Dickens, MD   BP 134/88 mmHg  Pulse 63  Temp(Src) 98.1 F (36.7 C) (Oral)  Resp 16  Ht 5' (1.524 m)  Wt 169 lb 12.8 oz (77.021 kg)  BMI 33.16 kg/m2  SpO2 98% Physical Exam  Constitutional: She is oriented to person, place, and time. She appears well-developed and well-nourished.  HENT:  Head: Normocephalic and atraumatic.  Right Ear: External ear normal.  Left Ear: External ear normal.  Eyes: Conjunctivae and EOM are normal. Pupils are equal, round, and reactive to light.  Neck: Normal range of motion. Neck supple.  Cardiovascular: Normal rate, regular rhythm, normal heart sounds and intact distal pulses.   Pulmonary/Chest: Effort normal and breath sounds normal.  Abdominal: Soft. Bowel sounds are normal. There is no tenderness.  Musculoskeletal: Normal range of motion.  Neurological: She is alert and oriented to person, place, and time.  Skin: Skin is warm and dry.  Vitals reviewed.   ED Course  Procedures (including critical care time) Labs Review Labs Reviewed  BASIC METABOLIC PANEL - Abnormal; Notable for the following:    Sodium 131 (*)    Chloride 93 (*)    Glucose, Bld 593 (*)    BUN 26 (*)    Calcium 8.8 (*)    All other components within normal limits  CBG MONITORING, ED - Abnormal; Notable for the following:    Glucose-Capillary 216 (*)    All other components within normal limits  CBC  BRAIN NATRIURETIC PEPTIDE  I-STAT TROPOININ, ED    Imaging Review Dg Chest 2 View  01/01/2015   CLINICAL DATA:  Chest pain for 1 day  EXAM: CHEST  2 VIEW  COMPARISON:  12/15/2013  FINDINGS: Cardiomediastinal silhouette is stable. No acute infiltrate or pleural effusion. No pulmonary edema. Bony thorax is  unremarkable.  IMPRESSION: No active cardiopulmonary disease.   Electronically Signed   By: Lahoma Crocker M.D.   On: 01/01/2015 15:30     EKG Interpretation   Date/Time:  Wednesday January 01 2015 12:49:41 EDT Ventricular Rate:  83 PR Interval:  158 QRS Duration: 80 QT Interval:  392 QTC Calculation: 460 R Axis:   24 Text Interpretation:  Normal sinus rhythm Septal infarct , age  undetermined Abnormal ECG No significant change since last tracing  Confirmed by Debby Freiberg [phone]) on 01/01/2015 2:20:40 PM      MDM   Final diagnoses:  Other fatigue  Chest pain, unspecified chest pain type    49 y.o. female with pertinent PMH of HTN, DM  presents with fatigue x 2 days which she describes as a heavy chest.  Has not checked her glucose today.  On arrival vitals and physical exam as above.  Pt does endorse intermittent chest pain over the last 3 weeks lasting for seconds, has not had any pain since 10 AM.  Wu unremarkable.  Likely gastric symptoms.  DC home to [facility].    I have reviewed all laboratory and imaging studies if ordered as above  1. Other fatigue   2. Chest pain, unspecified chest pain type         Debby Freiberg, MD 01/02/15 [phone]

## 2015-01-01 NOTE — ED Notes (Signed)
To ED via private vehicle with c/o chest heaviness, tired feeling, weakness. "I could just lay down and go to sleep"

## 2015-01-20 ENCOUNTER — Other Ambulatory Visit: Payer: Self-pay | Admitting: Internal Medicine

## 2015-01-22 NOTE — Telephone Encounter (Signed)
lyrica Done hardcopy to [facility]

## 2015-01-22 NOTE — Telephone Encounter (Signed)
Rx faxed to pharmacy  

## 2015-02-12 ENCOUNTER — Encounter: Payer: Self-pay | Admitting: Adult Health

## 2015-02-12 ENCOUNTER — Ambulatory Visit (INDEPENDENT_AMBULATORY_CARE_PROVIDER_SITE_OTHER): Payer: PRIVATE HEALTH INSURANCE | Admitting: Adult Health

## 2015-02-12 VITALS — BP 160/98 | HR 91 | Temp 98.5°F | Ht 60.0 in | Wt 166.9 lb

## 2015-02-12 DIAGNOSIS — R059 Cough, unspecified: Secondary | ICD-10-CM

## 2015-02-12 DIAGNOSIS — R05 Cough: Secondary | ICD-10-CM

## 2015-02-12 DIAGNOSIS — J0110 Acute frontal sinusitis, unspecified: Secondary | ICD-10-CM | POA: Diagnosis not present

## 2015-02-12 DIAGNOSIS — J029 Acute pharyngitis, unspecified: Secondary | ICD-10-CM | POA: Diagnosis not present

## 2015-02-12 MED ORDER — DOXYCYCLINE HYCLATE 100 MG PO CAPS: 100.0000 mg | ORAL_CAPSULE | Freq: Two times a day (BID) | ORAL | Status: DC

## 2015-02-12 MED ORDER — MAGIC MOUTHWASH W/LIDOCAINE: 5.0000 mL | Freq: Three times a day (TID) | ORAL | Status: DC | PRN

## 2015-02-12 MED ORDER — BENZONATATE 200 MG PO CAPS: 200.0000 mg | ORAL_CAPSULE | Freq: Three times a day (TID) | ORAL | Status: DC | PRN

## 2015-02-12 NOTE — Progress Notes (Signed)
Subjective:    Patient ID: [name] [name], female    DOB: [birth year], 49 y.o.   MRN: [id number]  HPI  49 year old female who presents to the office today for 2 days of frontal sinus pain and pressure, headache, sore throat, and dry cough. She has a known sick contact at work. Denies n/v/d  Has been taking Ibuprforen 800 mg and using throat lozenges without relief.    Review of Systems  Constitutional: Positive for chills, activity change, appetite change and fatigue. Negative for fever and diaphoresis.  HENT: Positive for rhinorrhea, sinus pressure, sneezing and sore throat. Negative for ear discharge, ear pain and trouble swallowing.   Eyes: Positive for photophobia.  Respiratory: Negative for chest tightness, shortness of breath and wheezing.   Cardiovascular: Negative.   Musculoskeletal: Negative.   Neurological: Positive for headaches.   Past Medical History  Diagnosis Date  . Diabetes mellitus   . Hypertension   . Heart murmur     Social History   Social History  . Marital Status: Married    Spouse Name: N/A  . Number of Children: N/A  . Years of Education: N/A   Occupational History  . Not on file.   Social History Main Topics  . Smoking status: Never Smoker   . Smokeless tobacco: Never Used  . Alcohol Use: No  . Drug Use: No  . Sexual Activity: Yes   Other Topics Concern  . Not on file   Social History Narrative    Past Surgical History  Procedure Laterality Date  . Abdominal hysterectomy    . Tubal ligation    . Tonsillectomy    . Cesarean section    . Ganglion cyst excision    . Eye surgery Left retinal  . Cholecystectomy      Family History  Problem Relation Age of Onset  . Diabetes Father   . Heart attack Father     Allergies  Allergen Reactions  . Tylenol [Acetaminophen] Itching    Current Outpatient Prescriptions on File Prior to Visit  Medication Sig Dispense Refill  . acetaminophen-codeine (TYLENOL #3) 300-30 MG per tablet  Take 1 tablet by mouth every 4 (four) hours as needed. 60 tablet 0  . atorvastatin (LIPITOR) 20 MG tablet Take 1 tablet (20 mg total) by mouth daily. 90 tablet 3  . canagliflozin (INVOKANA) 100 MG TABS tablet Take 1 tablet daily 30 tablet 3  . furosemide (LASIX) 20 MG tablet Take 20 mg by mouth daily.   3  . furosemide (LASIX) 40 MG tablet TAKE 1 AND 1/2 TABLETS(60 MG) BY MOUTH DAILY 45 tablet 1  . glucose blood (ONETOUCH VERIO) test strip Use as instructed to check blood sugar 3 times per day dx code E11.65 100 each 3  . Insulin Glargine (TOUJEO SOLOSTAR) 300 UNIT/ML SOPN Inject 35 Units into the skin at bedtime. 4.5 mL 3  . Insulin Pen Needle (BD PEN NEEDLE NANO U/F) 32G X 4 MM MISC Use to inject insulin 2 times per day 90 each 1  . insulin regular human CONCENTRATED (HUMULIN R U-500 KWIKPEN) 500 UNIT/ML SOLN injection Inject 18 units in the morning and 14 units in the evening 1000 mL 1  . Linaclotide (LINZESS) 290 MCG CAPS capsule Take 1 capsule (290 mcg total) by mouth daily. 30 capsule 11  . losartan (COZAAR) 100 MG tablet Take 1 tablet (100 mg total) by mouth daily. 90 tablet 3  . LYRICA 100 MG capsule TAKE 1 CAPSULE  BY MOUTH THREE TIMES DAILY AFTER MEALS 90 capsule 5  . pantoprazole (PROTONIX) 40 MG tablet Take 1 tablet (40 mg total) by mouth daily. 90 tablet 3  . sucralfate (CARAFATE) 1 GM/10ML suspension Take 10 mLs (1 g total) by mouth 4 (four) times daily -  with meals and at bedtime. 420 mL 0  . traMADol (ULTRAM) 50 MG tablet Take 1 tablet (50 mg total) by mouth every 6 (six) hours as needed. 30 tablet 0  . vitamin B-12 (CYANOCOBALAMIN) 1000 MCG tablet Take 1,000 mcg by mouth 2 (two) times daily.     Current Facility-Administered Medications on File Prior to Visit  Medication Dose Route Frequency Provider Last Rate Last Dose  . sodium chloride 0.9 % injection 999 mL  999 mL Intravenous Once Olugbemiga E Jegede, MD        BP 160/98 mmHg  Pulse 91  Temp(Src) 98.5 F (36.9 C) (Oral)   Ht 5' (1.524 m)  Wt 166 lb 14.4 oz (75.705 kg)  BMI 32.60 kg/m2  SpO2 97%       Objective:   Physical Exam  Constitutional: She is oriented to person, place, and time. She appears well-developed and well-nourished. She appears distressed.  Eyes: Conjunctivae and EOM are normal. Left eye exhibits no discharge.  Neck: Normal range of motion. Neck supple.  Cardiovascular: Normal rate, regular rhythm, normal heart sounds and intact distal pulses.  Exam reveals no friction rub.   No murmur heard. Pulmonary/Chest: Effort normal and breath sounds normal. No respiratory distress. She has no wheezes. She has no rales.  Abdominal: Soft. Bowel sounds are normal. She exhibits no distension and no mass. There is no tenderness. There is no rebound and no guarding.  Lymphadenopathy:    She has no cervical adenopathy.  Neurological: She is alert and oriented to person, place, and time.  Skin: Skin is warm and dry. No rash noted. She is not diaphoretic. No erythema. No pallor.  Psychiatric: She has a normal mood and affect. Her behavior is normal. Judgment and thought content normal.  Nursing note and vitals reviewed.      Assessment & Plan:  1. Acute frontal sinusitis, recurrence not specified - doxycycline (VIBRAMYCIN) 100 MG capsule; Take 1 capsule (100 mg total) by mouth 2 (two) times daily.  Dispense: 14 capsule; Refill: 0 - Follow up if no improvement in 2-3 days or sooner if symptoms worsen.  2. Sore throat - Alum & Mag Hydroxide-Simeth (MAGIC MOUTHWASH W/LIDOCAINE) SOLN; Take 5 mLs by mouth 3 (three) times daily as needed for mouth pain.  Dispense: 50 mL; Refill: 0  3. Cough - benzonatate (TESSALON) 200 MG capsule; Take 1 capsule (200 mg total) by mouth 3 (three) times daily as needed for cough.  Dispense: 20 capsule; Refill: 0

## 2015-02-12 NOTE — Progress Notes (Signed)
Pre visit review using our clinic review tool, if applicable. No additional management support is needed unless otherwise documented below in the visit note. 

## 2015-02-12 NOTE — Patient Instructions (Signed)
It was a pleasure meeting you today and I hope you feel better.   You have three prescriptions  1. Doxycyline - this is an antiobiotic, please take it twice a day until gone 2. Tessalon Pearls - for cough  3. Magic Mouth wash - gargle for 15 seconds and spit.   Follow up in 2-3 days if no improvement.

## 2015-03-13 ENCOUNTER — Ambulatory Visit (INDEPENDENT_AMBULATORY_CARE_PROVIDER_SITE_OTHER): Payer: PRIVATE HEALTH INSURANCE | Admitting: Internal Medicine

## 2015-03-13 VITALS — BP 104/74 | HR 80 | Temp 98.1°F | Ht 60.0 in | Wt 166.0 lb

## 2015-03-13 DIAGNOSIS — I10 Essential (primary) hypertension: Secondary | ICD-10-CM | POA: Diagnosis not present

## 2015-03-13 DIAGNOSIS — E1165 Type 2 diabetes mellitus with hyperglycemia: Secondary | ICD-10-CM | POA: Diagnosis not present

## 2015-03-13 DIAGNOSIS — IMO0002 Reserved for concepts with insufficient information to code with codable children: Secondary | ICD-10-CM

## 2015-03-13 DIAGNOSIS — R350 Frequency of micturition: Secondary | ICD-10-CM | POA: Diagnosis not present

## 2015-03-13 LAB — POCT URINALYSIS DIPSTICK
Bilirubin, UA: NEGATIVE
Blood, UA: 25
Ketones, UA: NEGATIVE
Leukocytes, UA: NEGATIVE
Nitrite, UA: NEGATIVE
Protein, UA: NEGATIVE
Spec Grav, UA: 1.01
Urobilinogen, UA: 2
pH, UA: 6

## 2015-03-13 MED ORDER — CEFTRIAXONE SODIUM 500 MG IJ SOLR
1000.0000 mg | Freq: Once | INTRAMUSCULAR | Status: AC
  Administered 2015-03-13: 1000 mg via INTRAMUSCULAR

## 2015-03-13 MED ORDER — CEPHALEXIN 500 MG PO CAPS: 500.0000 mg | ORAL_CAPSULE | Freq: Four times a day (QID) | ORAL | Status: DC

## 2015-03-13 NOTE — Assessment & Plan Note (Signed)
stable overall by history and exam, recent data reviewed with pt, and pt to continue medical treatment as before,  to f/u any worsening symptoms or concerns Lab Results  Component Value Date   HGBA1C 17.8* 10/10/2014

## 2015-03-13 NOTE — Progress Notes (Signed)
Pre visit review using our clinic review tool, if applicable. No additional management support is needed unless otherwise documented below in the visit note. 

## 2015-03-13 NOTE — Assessment & Plan Note (Signed)
Hx and exam c/w UTI, for rocephin IM , then for antibx course,  to f/u any worsening symptoms or concerns

## 2015-03-13 NOTE — Addendum Note (Signed)
Addended by: Lyman Bishop on: 03/13/2015 07:01 PM   Modules accepted: Orders

## 2015-03-13 NOTE — Assessment & Plan Note (Signed)
stable overall by history and exam, recent data reviewed with pt, and pt to continue medical treatment as before,  to f/u any worsening symptoms or concerns BP Readings from Last 3 Encounters:  03/13/15 104/74  02/12/15 160/98  01/01/15 134/88

## 2015-03-13 NOTE — Patient Instructions (Signed)
You had the antibiotic shot today (rocephin)  Please take all new medication as prescribed - the pill antibiotic (cephalexin) for urinary tract infection  Please continue all other medications as before, and refills have been done if requested.  Please have the pharmacy call with any other refills you may need..  Please keep your appointments with your specialists as you may have planned

## 2015-03-13 NOTE — Progress Notes (Signed)
Subjective:    Patient ID: ROBINA [name], female    DOB: 03/01/1966, 49 y.o.   MRN: [id number]  HPI  Here with 3 days onset fatigue, nausea , vomit x 1 (no blood), left lower and mid abd pain, constant, mild to mod, with urinary freq and nocturia new for her, but Denies urinary symptoms such as dysuria , urgency, flank pain, hematuria or n/v, fever, chills.  Pt denies fever, wt loss, night sweats, loss of appetite, or other constitutional symptoms, but has felt somewhat warm Pt denies chest pain, increased sob or doe, wheezing, orthopnea, PND, increased LE swelling, palpitations, dizziness or syncope.   Pt denies polydipsia, polyuria,   Past Medical History  Diagnosis Date  . Diabetes mellitus   . Hypertension   . Heart murmur    Past Surgical History  Procedure Laterality Date  . Abdominal hysterectomy    . Tubal ligation    . Tonsillectomy    . Cesarean section    . Ganglion cyst excision    . Eye surgery Left retinal  . Cholecystectomy      reports that she has never smoked. She has never used smokeless tobacco. She reports that she does not drink alcohol or use illicit drugs. family history includes Diabetes in her father; Heart attack in her father. Allergies  Allergen Reactions  . Tylenol [Acetaminophen] Itching   Current Outpatient Prescriptions on File Prior to Visit  Medication Sig Dispense Refill  . glucose blood (ONETOUCH VERIO) test strip Use as instructed to check blood sugar 3 times per day dx code E11.65 100 each 3  . insulin regular human CONCENTRATED (HUMULIN R U-500 KWIKPEN) 500 UNIT/ML SOLN injection Inject 18 units in the morning and 14 units in the evening 1000 mL 1  . losartan (COZAAR) 100 MG tablet Take 1 tablet (100 mg total) by mouth daily. 90 tablet 3  . LYRICA 100 MG capsule TAKE 1 CAPSULE BY MOUTH THREE TIMES DAILY AFTER MEALS 90 capsule 5  . vitamin B-12 (CYANOCOBALAMIN) 1000 MCG tablet Take 1,000 mcg by mouth 2 (two) times daily.    Marland Kitchen  acetaminophen-codeine (TYLENOL #3) 300-30 MG per tablet Take 1 tablet by mouth every 4 (four) hours as needed. (Patient not taking: Reported on 03/13/2015) 60 tablet 0  . Alum & Mag Hydroxide-Simeth (MAGIC MOUTHWASH W/LIDOCAINE) SOLN Take 5 mLs by mouth 3 (three) times daily as needed for mouth pain. (Patient not taking: Reported on 03/13/2015) 50 mL 0  . atorvastatin (LIPITOR) 20 MG tablet Take 1 tablet (20 mg total) by mouth daily. (Patient not taking: Reported on 03/13/2015) 90 tablet 3  . benzonatate (TESSALON) 200 MG capsule Take 1 capsule (200 mg total) by mouth 3 (three) times daily as needed for cough. (Patient not taking: Reported on 03/13/2015) 20 capsule 0  . canagliflozin (INVOKANA) 100 MG TABS tablet Take 1 tablet daily (Patient not taking: Reported on 03/13/2015) 30 tablet 3  . doxycycline (VIBRAMYCIN) 100 MG capsule Take 1 capsule (100 mg total) by mouth 2 (two) times daily. (Patient not taking: Reported on 03/13/2015) 14 capsule 0  . furosemide (LASIX) 20 MG tablet Take 20 mg by mouth daily.   3  . furosemide (LASIX) 40 MG tablet TAKE 1 AND 1/2 TABLETS(60 MG) BY MOUTH DAILY (Patient not taking: Reported on 03/13/2015) 45 tablet 1  . Insulin Glargine (TOUJEO SOLOSTAR) 300 UNIT/ML SOPN Inject 35 Units into the skin at bedtime. (Patient not taking: Reported on 03/13/2015) 4.5 mL 3  .  Insulin Pen Needle (BD PEN NEEDLE NANO U/F) 32G X 4 MM MISC Use to inject insulin 2 times per day (Patient not taking: Reported on 03/13/2015) 90 each 1  . Linaclotide (LINZESS) 290 MCG CAPS capsule Take 1 capsule (290 mcg total) by mouth daily. (Patient not taking: Reported on 03/13/2015) 30 capsule 11  . pantoprazole (PROTONIX) 40 MG tablet Take 1 tablet (40 mg total) by mouth daily. (Patient not taking: Reported on 03/13/2015) 90 tablet 3  . sucralfate (CARAFATE) 1 GM/10ML suspension Take 10 mLs (1 g total) by mouth 4 (four) times daily -  with meals and at bedtime. (Patient not taking: Reported on 03/13/2015) 420 mL 0  . traMADol  (ULTRAM) 50 MG tablet Take 1 tablet (50 mg total) by mouth every 6 (six) hours as needed. (Patient not taking: Reported on 03/13/2015) 30 tablet 0   Current Facility-Administered Medications on File Prior to Visit  Medication Dose Route Frequency Provider Last Rate Last Dose  . sodium chloride 0.9 % injection 999 mL  999 mL Intravenous Once Tresa Garter, MD       Review of Systems  Constitutional: Negative for unusual diaphoresis or night sweats HENT: Negative for ringing in ear or discharge Eyes: Negative for double vision or worsening visual disturbance.  Respiratory: Negative for choking and stridor.   Gastrointestinal: Negative for vomiting or other signifcant bowel change Genitourinary: Negative for hematuria or change in urine volume.  Musculoskeletal: Negative for other MSK pain or swelling Skin: Negative for color change and worsening wound.  Neurological: Negative for tremors and numbness other than noted  Psychiatric/Behavioral: Negative for decreased concentration or agitation other than above       Objective:   Physical Exam BP 104/74 mmHg  Pulse 80  Temp(Src) 98.1 F (36.7 C) (Oral)  Ht 5' (1.524 m)  Wt 166 lb (75.297 kg)  BMI 32.42 kg/m2  SpO2 97% VS noted, mild ill and fatigued appearing Constitutional: Pt appears in no significant distress HENT: Head: NCAT.  Right Ear: External ear normal.  Left Ear: External ear normal.  Eyes: . Pupils are equal, round, and reactive to light. Conjunctivae and EOM are normal Neck: Normal range of motion. Neck supple.  Cardiovascular: Normal rate and regular rhythm.   Pulmonary/Chest: Effort normal and breath sounds without rales or wheezing.  Abd:  Soft, + BS, ND with mid and somewhat LLQ tender without guarding or rebound Neurological: Pt is alert. Not confused , motor grossly intact Skin: Skin is warm. No rash, no LE edema Psychiatric: Pt behavior is normal. No agitation.    Udip + leuk est, large blood       Assessment & Plan:

## 2015-05-03 ENCOUNTER — Ambulatory Visit: Payer: Self-pay | Admitting: Family Medicine

## 2015-05-03 ENCOUNTER — Encounter: Payer: Self-pay | Admitting: Family Medicine

## 2015-05-03 ENCOUNTER — Ambulatory Visit (INDEPENDENT_AMBULATORY_CARE_PROVIDER_SITE_OTHER): Payer: PRIVATE HEALTH INSURANCE | Admitting: Family Medicine

## 2015-05-03 VITALS — BP 170/108 | HR 90 | Temp 98.5°F | Resp 18 | Ht 60.0 in | Wt 166.0 lb

## 2015-05-03 DIAGNOSIS — I10 Essential (primary) hypertension: Secondary | ICD-10-CM

## 2015-05-03 MED ORDER — AMLODIPINE BESYLATE 5 MG PO TABS: 5.0000 mg | ORAL_TABLET | Freq: Every day | ORAL | Status: DC

## 2015-05-03 MED ORDER — FUROSEMIDE 40 MG PO TABS: ORAL_TABLET | ORAL | Status: DC

## 2015-05-03 MED ORDER — LINACLOTIDE 290 MCG PO CAPS: 290.0000 ug | ORAL_CAPSULE | Freq: Every day | ORAL | Status: DC

## 2015-05-03 NOTE — Progress Notes (Signed)
Pre visit review using our clinic review tool, if applicable. No additional management support is needed unless otherwise documented below in the visit note. 

## 2015-05-03 NOTE — Progress Notes (Signed)
   Subjective:    Patient ID: [name] [name], female    DOB: 1965-07-19, 49 y.o.   MRN: [id number]  HPI   Patient seen Saturday clinic with concerns for elevated blood pressure and frequent headaches recently. She has obtained several readings in the mornings over the past couple weeks over 123XX123 systolic and over 123XX123 diastolic. She takes losartan 100 mg daily and furosemide 40 mg one and one half tablets daily. She has been compliant with therapy. No nonsteroidal use. No alcohol use. She's also notes some dull bilateral headaches which seem to be worse in the mornings. They seem to improve after she takes her blood pressure medication.  Past Medical History  Diagnosis Date  . Diabetes mellitus   . Hypertension   . Heart murmur    Past Surgical History  Procedure Laterality Date  . Abdominal hysterectomy    . Tubal ligation    . Tonsillectomy    . Cesarean section    . Ganglion cyst excision    . Eye surgery Left retinal  . Cholecystectomy      reports that she has never smoked. She has never used smokeless tobacco. She reports that she does not drink alcohol or use illicit drugs. family history includes Diabetes in her father; Heart attack in her father. Allergies  Allergen Reactions  . Tylenol [Acetaminophen] Itching      Review of Systems  Constitutional: Negative for fatigue.  Eyes: Negative for visual disturbance.  Respiratory: Negative for cough, chest tightness, shortness of breath and wheezing.   Cardiovascular: Negative for chest pain, palpitations and leg swelling.  Neurological: Negative for dizziness, seizures, syncope, weakness, light-headedness and headaches.       Objective:   Physical Exam  Constitutional: She appears well-developed and well-nourished. No distress.  Cardiovascular: Normal rate and regular rhythm.   Pulmonary/Chest: Effort normal and breath sounds normal. No respiratory distress. She has no wheezes. She has no rales.  Musculoskeletal:  Trace  edema legs.            Assessment & Plan:  Hypertension. Poorly controlled. Suspect her headaches may be secondary-to elevated blood pressure. Add amlodipine 5 mgs daily. She'll continue her other usual blood pressure medications. We'll encourage her to follow-up with her primary within 2 weeks to reassess. Low-sodium diet.

## 2015-05-03 NOTE — Patient Instructions (Signed)
Start amlodipine 5 mg daily Watch sodium intake Schedule follow-up with your primary physician and 2 weeks and sooner as needed

## 2015-05-19 ENCOUNTER — Telehealth: Payer: Self-pay | Admitting: Internal Medicine

## 2015-05-19 NOTE — Telephone Encounter (Signed)
Pt called wondering if Dr. Jenny Reichmann can work her in for a physical. Pt need to have it done by the end of this week due to insurance deadline. Please advise  Phone # [phone]

## 2015-05-20 NOTE — Telephone Encounter (Signed)
Ok with me 

## 2015-05-21 NOTE — Telephone Encounter (Signed)
Left message for patient to call back to schedule.  °

## 2015-05-22 ENCOUNTER — Ambulatory Visit (INDEPENDENT_AMBULATORY_CARE_PROVIDER_SITE_OTHER): Payer: PRIVATE HEALTH INSURANCE | Admitting: Internal Medicine

## 2015-05-22 ENCOUNTER — Encounter: Payer: Self-pay | Admitting: Internal Medicine

## 2015-05-22 ENCOUNTER — Ambulatory Visit (INDEPENDENT_AMBULATORY_CARE_PROVIDER_SITE_OTHER)
Admission: RE | Admit: 2015-05-22 | Discharge: 2015-05-22 | Disposition: A | Discharge status: Home / Self Care | Payer: PRIVATE HEALTH INSURANCE | Source: Ambulatory Visit | Attending: Internal Medicine | Admitting: Internal Medicine

## 2015-05-22 ENCOUNTER — Telehealth: Payer: Self-pay

## 2015-05-22 ENCOUNTER — Other Ambulatory Visit: Payer: PRIVATE HEALTH INSURANCE

## 2015-05-22 ENCOUNTER — Other Ambulatory Visit (INDEPENDENT_AMBULATORY_CARE_PROVIDER_SITE_OTHER): Payer: PRIVATE HEALTH INSURANCE

## 2015-05-22 ENCOUNTER — Other Ambulatory Visit: Payer: Self-pay | Admitting: Internal Medicine

## 2015-05-22 VITALS — BP 136/86 | HR 79 | Temp 98.4°F | Ht 60.0 in | Wt 166.0 lb

## 2015-05-22 DIAGNOSIS — E1140 Type 2 diabetes mellitus with diabetic neuropathy, unspecified: Secondary | ICD-10-CM

## 2015-05-22 DIAGNOSIS — Z0189 Encounter for other specified special examinations: Secondary | ICD-10-CM

## 2015-05-22 DIAGNOSIS — E1040 Type 1 diabetes mellitus with diabetic neuropathy, unspecified: Secondary | ICD-10-CM

## 2015-05-22 DIAGNOSIS — IMO0002 Reserved for concepts with insufficient information to code with codable children: Secondary | ICD-10-CM

## 2015-05-22 DIAGNOSIS — R079 Chest pain, unspecified: Secondary | ICD-10-CM | POA: Diagnosis not present

## 2015-05-22 DIAGNOSIS — E1165 Type 2 diabetes mellitus with hyperglycemia: Secondary | ICD-10-CM

## 2015-05-22 DIAGNOSIS — Z0000 Encounter for general adult medical examination without abnormal findings: Secondary | ICD-10-CM

## 2015-05-22 DIAGNOSIS — I10 Essential (primary) hypertension: Secondary | ICD-10-CM

## 2015-05-22 DIAGNOSIS — R739 Hyperglycemia, unspecified: Secondary | ICD-10-CM

## 2015-05-22 DIAGNOSIS — Z794 Long term (current) use of insulin: Secondary | ICD-10-CM

## 2015-05-22 DIAGNOSIS — R0789 Other chest pain: Secondary | ICD-10-CM | POA: Diagnosis not present

## 2015-05-22 DIAGNOSIS — Z23 Encounter for immunization: Secondary | ICD-10-CM

## 2015-05-22 DIAGNOSIS — E1149 Type 2 diabetes mellitus with other diabetic neurological complication: Secondary | ICD-10-CM

## 2015-05-22 DIAGNOSIS — E109 Type 1 diabetes mellitus without complications: Secondary | ICD-10-CM

## 2015-05-22 LAB — BASIC METABOLIC PANEL
BUN: 10 mg/dL (ref 6–23)
CO2: 29 mEq/L (ref 19–32)
Calcium: 8.7 mg/dL (ref 8.4–10.5)
Chloride: 92 mEq/L — ABNORMAL LOW (ref 96–112)
Creatinine, Ser: 0.97 mg/dL (ref 0.40–1.20)
GFR: 78.55 mL/min (ref >60.00)
Glucose, Bld: 726 mg/dL (ref 70–99)
Potassium: 3.6 mEq/L (ref 3.5–5.1)
Sodium: 129 mEq/L — ABNORMAL LOW (ref 135–145)

## 2015-05-22 LAB — LIPID PANEL
Cholesterol: 159 mg/dL (ref 0–200)
HDL: 50.3 mg/dL (ref >39.00)
LDL Cholesterol: 84 mg/dL (ref 0–99)
NonHDL: 108.77
Total CHOL/HDL Ratio: 3
Triglycerides: 123 mg/dL (ref 0.0–149.0)
VLDL: 24.6 mg/dL (ref 0.0–40.0)

## 2015-05-22 LAB — HEPATIC FUNCTION PANEL
ALT: 17 U/L (ref 0–35)
AST: 19 U/L (ref 0–37)
Albumin: 3.6 g/dL (ref 3.5–5.2)
Alkaline Phosphatase: 152 U/L — ABNORMAL HIGH (ref 39–117)
Bilirubin, Direct: 0.1 mg/dL (ref 0.0–0.3)
Total Bilirubin: 0.3 mg/dL (ref 0.2–1.2)
Total Protein: 6.6 g/dL (ref 6.0–8.3)

## 2015-05-22 MED ORDER — PREGABALIN 100 MG PO CAPS: ORAL_CAPSULE | ORAL | Status: DC

## 2015-05-22 NOTE — Progress Notes (Signed)
Subjective:    Patient ID: [name] [name], female    DOB: 12-17-1965, 49 y.o.   MRN: [id number]  HPI   Here to re-establish as new pt,  Pt denies increasing sob or doe, wheezing, orthopnea, PND, increased LE swelling, palpitations, dizziness or syncope, but has had intermittent sscp, sharp, pleuritic but nonexertional, nonpositional without radiation, n/v, diaphoresis.  Pt denies new neurological symptoms such as new headache, or facial or extremity weakness or numbness.  Pt denies polydipsia, polyuria, or low sugar episode.   Pt denies new neurological symptoms such as new headache, or facial or extremity weakness or numbness.   Pt states overall good compliance with meds, mostly trying to follow appropriate diet, with wt overall stable,  but little exercise however.  Lost significnat wt with with better diet and exercise but not checking sugars recently Wt Readings from Last 3 Encounters:  05/22/15 166 lb (75.297 kg)  05/03/15 166 lb (75.297 kg)  03/13/15 166 lb (75.297 kg)   Past Medical History  Diagnosis Date  . Diabetes mellitus   . Hypertension   . Heart murmur    Past Surgical History  Procedure Laterality Date  . Abdominal hysterectomy    . Tubal ligation    . Tonsillectomy    . Cesarean section    . Ganglion cyst excision    . Eye surgery Left retinal  . Cholecystectomy      reports that she has never smoked. She has never used smokeless tobacco. She reports that she does not drink alcohol or use illicit drugs. family history includes Diabetes in her father; Heart attack in her father. Allergies  Allergen Reactions  . Tylenol [Acetaminophen] Itching   Current Outpatient Prescriptions on File Prior to Visit  Medication Sig Dispense Refill  . amLODipine (NORVASC) 5 MG tablet Take 1 tablet (5 mg total) by mouth daily. 30 tablet 5  . atorvastatin (LIPITOR) 20 MG tablet Take 1 tablet (20 mg total) by mouth daily. 90 tablet 3  . brimonidine (ALPHAGAN) 0.2 % ophthalmic  solution INSTILL 1 DROP IN THE LEFT EYE BID STARTING 48 HOURS PRIOR TO SURGERY  1  . CYCLOGYL 1 % ophthalmic solution INSTILL 1 DROP IN THE LEFT EYE TID STARTING 24 HOURS PRIOR TO SURGERY  0  . furosemide (LASIX) 40 MG tablet TAKE 1 AND 1/2 TABLETS(60 MG) BY MOUTH DAILY 45 tablet 1  . glucose blood (ONETOUCH VERIO) test strip Use as instructed to check blood sugar 3 times per day dx code E11.65 100 each 3  . Insulin Glargine (TOUJEO SOLOSTAR) 300 UNIT/ML SOPN Inject 35 Units into the skin at bedtime. 4.5 mL 3  . Insulin Pen Needle (BD PEN NEEDLE NANO U/F) 32G X 4 MM MISC Use to inject insulin 2 times per day 90 each 1  . insulin regular human CONCENTRATED (HUMULIN R U-500 KWIKPEN) 500 UNIT/ML SOLN injection Inject 18 units in the morning and 14 units in the evening 1000 mL 1  . ketorolac (ACULAR) 0.5 % ophthalmic solution INSTILL 1 DROP IN THE LEFT EYE QID STARTING 1 WEEK PRIOR TO SURGERY  4  . Linaclotide (LINZESS) 290 MCG CAPS capsule Take 1 capsule (290 mcg total) by mouth daily. 30 capsule 1  . losartan (COZAAR) 100 MG tablet Take 1 tablet (100 mg total) by mouth daily. 90 tablet 3  . ofloxacin (OCUFLOX) 0.3 % ophthalmic solution INSTILL 1 DROP IN OU QID STARTING 72 HOURS PRIOR TO SURGERY  1  . prednisoLONE acetate (PRED  FORTE) 1 % ophthalmic suspension INSTILL 1 DROP IN THE LEFT EYE QID STARTING AFTER SURGERY  1  . vitamin B-12 (CYANOCOBALAMIN) 1000 MCG tablet Take 1,000 mcg by mouth 2 (two) times daily.     Current Facility-Administered Medications on File Prior to Visit  Medication Dose Route Frequency Provider Last Rate Last Dose  . sodium chloride 0.9 % injection 999 mL  999 mL Intravenous Once Tresa Garter, MD       Review of Systems Constitutional: Negative for increased diaphoresis, other activity, appetite or siginficant weight change other than noted HENT: Negative for worsening hearing loss, ear pain, facial swelling, mouth sores and neck stiffness.   Eyes: Negative for  other worsening pain, redness or visual disturbance.  Respiratory: Negative for shortness of breath and wheezing  Cardiovascular: Negative for chest pain and palpitations.  Gastrointestinal: Negative for diarrhea, blood in stool, abdominal distention or other pain Genitourinary: Negative for hematuria, flank pain or change in urine volume.  Musculoskeletal: Negative for myalgias or other joint complaints.  Skin: Negative for color change and wound or drainage.  Neurological: Negative for syncope and numbness. other than noted Hematological: Negative for adenopathy. or other swelling Psychiatric/Behavioral: Negative for hallucinations, SI, self-injury, decreased concentration or other worsening agitation.      Objective:   Physical Exam BP 136/86 mmHg  Pulse 79  Temp(Src) 98.4 F (36.9 C) (Oral)  Ht 5' (1.524 m)  Wt 166 lb (75.297 kg)  BMI 32.42 kg/m2  SpO2 98% VS noted,  Constitutional: Pt appears in no significant distress HENT: Head: NCAT.  Right Ear: External ear normal.  Left Ear: External ear normal.  Eyes: . Pupils are equal, round, and reactive to light. Conjunctivae and EOM are normal Neck: Normal range of motion. Neck supple.  Cardiovascular: Normal rate and regular rhythm.   Pulmonary/Chest: Effort normal and breath sounds without rales or wheezing.  Abd:  Soft, NT, ND, + BS Neurological: Pt is alert. Not confused , motor grossly intact Skin: Skin is warm. No rash, no LE edema Psychiatric: Pt behavior is normal. No agitation.      Assessment & Plan:

## 2015-05-22 NOTE — Telephone Encounter (Signed)
Called patient x 2 and left message stating one of her labs came back within critical range and to go to the ER ASAP.

## 2015-05-22 NOTE — Telephone Encounter (Signed)
Roger Kill called from the lab  Glucose: 726  A1c: 18% + but they are sending to [facility] for more accurate results. (machine in our lab only goes to 18%)

## 2015-05-22 NOTE — Progress Notes (Signed)
Pre visit review using our clinic review tool, if applicable. No additional management support is needed unless otherwise documented below in the visit note. 

## 2015-05-22 NOTE — Patient Instructions (Addendum)
You had the Prevnar pneumonia shot  Your EKG was OK today  Please continue all other medications as before, and refills have been done if requested - the lyrica  Please have the pharmacy call with any other refills you may need.  Please continue your efforts at being more active, low cholesterol diet, and weight control  Please keep your appointments with your specialists as you may have planned  Please go to the XRAY Department in the Basement (go straight as you get off the elevator) for the x-ray testing  Please go to the LAB in the Basement (turn left off the elevator) for the tests to be done today  You will be contacted by phone if any changes need to be made immediately.  Otherwise, you will receive a letter about your results with an explanation, but please check with MyChart first.  Please remember to sign up for MyChart if you have not done so, as this will be important to you in the future with finding out test results, communicating by private email, and scheduling acute appointments online when needed.  Please return in 6 months, or sooner if needed, with Lab testing done 3-5 days before

## 2015-05-22 NOTE — Assessment & Plan Note (Addendum)
Atypical, suspect msk, declines stress testing,  ECG reviewed as per emr, for cxr today,  to f/u any worsening symptoms or concerns

## 2015-05-22 NOTE — Telephone Encounter (Signed)
Called pt again, stated the same on VM. Advised that office phone are open until 6:15 if she called back

## 2015-05-23 ENCOUNTER — Encounter (HOSPITAL_COMMUNITY): Payer: Self-pay | Admitting: Neurology

## 2015-05-23 ENCOUNTER — Emergency Department (HOSPITAL_COMMUNITY)
Admission: EM | Admit: 2015-05-23 | Discharge: 2015-05-23 | Disposition: A | Discharge status: Home / Self Care | Payer: PRIVATE HEALTH INSURANCE | Attending: Emergency Medicine | Admitting: Emergency Medicine

## 2015-05-23 DIAGNOSIS — R358 Other polyuria: Secondary | ICD-10-CM | POA: Insufficient documentation

## 2015-05-23 DIAGNOSIS — I10 Essential (primary) hypertension: Secondary | ICD-10-CM | POA: Insufficient documentation

## 2015-05-23 DIAGNOSIS — R011 Cardiac murmur, unspecified: Secondary | ICD-10-CM | POA: Diagnosis not present

## 2015-05-23 DIAGNOSIS — R631 Polydipsia: Secondary | ICD-10-CM | POA: Insufficient documentation

## 2015-05-23 DIAGNOSIS — E1165 Type 2 diabetes mellitus with hyperglycemia: Secondary | ICD-10-CM | POA: Diagnosis not present

## 2015-05-23 DIAGNOSIS — Z794 Long term (current) use of insulin: Secondary | ICD-10-CM | POA: Insufficient documentation

## 2015-05-23 DIAGNOSIS — Z79899 Other long term (current) drug therapy: Secondary | ICD-10-CM | POA: Insufficient documentation

## 2015-05-23 LAB — URINALYSIS, ROUTINE W REFLEX MICROSCOPIC
Bilirubin Urine: NEGATIVE
Glucose, UA: >1000 mg/dL — AB
Ketones, ur: NEGATIVE mg/dL
Leukocytes, UA: NEGATIVE
Nitrite: NEGATIVE
Protein, ur: 30 mg/dL — AB
Specific Gravity, Urine: 1.037 — ABNORMAL HIGH (ref 1.005–1.030)
pH: 7 (ref 5.0–8.0)

## 2015-05-23 LAB — URINE MICROSCOPIC-ADD ON

## 2015-05-23 LAB — CBC
HCT: 39.4 % (ref 36.0–46.0)
Hemoglobin: 12.8 g/dL (ref 12.0–15.0)
MCH: 26.4 pg (ref 26.0–34.0)
MCHC: 32.5 g/dL (ref 30.0–36.0)
MCV: 81.4 fL (ref 78.0–100.0)
Platelets: 159 10*3/uL (ref 150–400)
RBC: 4.84 MIL/uL (ref 3.87–5.11)
RDW: 12.8 % (ref 11.5–15.5)
WBC: 5.9 10*3/uL (ref 4.0–10.5)

## 2015-05-23 LAB — BASIC METABOLIC PANEL
Anion gap: 6 (ref 5–15)
BUN: 9 mg/dL (ref 6–20)
CO2: 29 mmol/L (ref 22–32)
Calcium: 8.6 mg/dL — ABNORMAL LOW (ref 8.9–10.3)
Chloride: 99 mmol/L — ABNORMAL LOW (ref 101–111)
Creatinine, Ser: 0.74 mg/dL (ref 0.44–1.00)
GFR calc Af Amer: >60 mL/min (ref >60)
GFR calc non Af Amer: >60 mL/min (ref >60)
Glucose, Bld: 499 mg/dL — ABNORMAL HIGH (ref 65–99)
Potassium: 3.8 mmol/L (ref 3.5–5.1)
Sodium: 134 mmol/L — ABNORMAL LOW (ref 135–145)

## 2015-05-23 LAB — CBG MONITORING, ED
Glucose-Capillary: 439 mg/dL — ABNORMAL HIGH (ref 65–99)
Glucose-Capillary: 333 mg/dL — ABNORMAL HIGH (ref 65–99)

## 2015-05-23 LAB — HEMOGLOBIN A1C
Hgb A1c MFr Bld: 19 % — ABNORMAL HIGH (ref <5.7)
Mean Plasma Glucose: 499 mg/dL — ABNORMAL HIGH (ref <117)

## 2015-05-23 MED ORDER — SODIUM CHLORIDE 0.9 % IV SOLN: 1000.0000 mL | INTRAVENOUS | Status: DC

## 2015-05-23 MED ORDER — INSULIN ASPART 100 UNIT/ML ~~LOC~~ SOLN
10.0000 [IU] | Freq: Once | SUBCUTANEOUS | Status: AC
  Administered 2015-05-23: 10 [IU] via SUBCUTANEOUS
  Filled 2015-05-23: qty 1

## 2015-05-23 MED ORDER — SODIUM CHLORIDE 0.9 % IV SOLN
1000.0000 mL | Freq: Once | INTRAVENOUS | Status: AC
  Administered 2015-05-23: 1000 mL via INTRAVENOUS

## 2015-05-23 MED ORDER — GI COCKTAIL ~~LOC~~
30.0000 mL | Freq: Once | ORAL | Status: AC
  Administered 2015-05-23: 30 mL via ORAL
  Filled 2015-05-23: qty 30

## 2015-05-23 NOTE — Telephone Encounter (Signed)
Called pt on number listed. LVM for pt to contact us immediately.   Called pt emergency contact. LVM for pt or spouse to contact us immediately.   Tried to call pt son but it is no longer his number.   Sending pt a my chart message.

## 2015-05-23 NOTE — Discharge Instructions (Signed)
You have been evaluated for your hyperglycemia. No evidence of DKA today. Please use insulin as prescribed. Diet and exercise and follow-up closely with your doctor for further management of your diabetes.  Hyperglycemia Hyperglycemia occurs when the glucose (sugar) in your blood is too high. Hyperglycemia can happen for many reasons, but it most often happens to people who do not know they have diabetes or are not managing their diabetes properly.  CAUSES  Whether you have diabetes or not, there are other causes of hyperglycemia. Hyperglycemia can occur when you have diabetes, but it can also occur in other situations that you might not be as aware of, such as: Diabetes  If you have diabetes and are having problems controlling your blood glucose, hyperglycemia could occur because of some of the following reasons:  Not following your meal plan.  Not taking your diabetes medications or not taking it properly.  Exercising less or doing less activity than you normally do.  Being sick. Pre-diabetes  This cannot be ignored. Before people develop Type 2 diabetes, they almost always have "pre-diabetes." This is when your blood glucose levels are higher than normal, but not yet high enough to be diagnosed as diabetes. Research has shown that some long-term damage to the body, especially the heart and circulatory system, may already be occurring during pre-diabetes. If you take action to manage your blood glucose when you have pre-diabetes, you may delay or prevent Type 2 diabetes from developing. Stress  If you have diabetes, you may be "diet" controlled or on oral medications or insulin to control your diabetes. However, you may find that your blood glucose is higher than usual in the hospital whether you have diabetes or not. This is often referred to as "stress hyperglycemia." Stress can elevate your blood glucose. This happens because of hormones put out by the body during times of stress. If  stress has been the cause of your high blood glucose, it can be followed regularly by your caregiver. That way he/she can make sure your hyperglycemia does not continue to get worse or progress to diabetes. Steroids  Steroids are medications that act on the infection fighting system (immune system) to block inflammation or infection. One side effect can be a rise in blood glucose. Most people can produce enough extra insulin to allow for this rise, but for those who cannot, steroids make blood glucose levels go even higher. It is not unusual for steroid treatments to "uncover" diabetes that is developing. It is not always possible to determine if the hyperglycemia will go away after the steroids are stopped. A special blood test called an A1c is sometimes done to determine if your blood glucose was elevated before the steroids were started. SYMPTOMS  Thirsty.  Frequent urination.  Dry mouth.  Blurred vision.  Tired or fatigue.  Weakness.  Sleepy.  Tingling in feet or leg. DIAGNOSIS  Diagnosis is made by monitoring blood glucose in one or all of the following ways:  A1c test. This is a chemical found in your blood.  Fingerstick blood glucose monitoring.  Laboratory results. TREATMENT  First, knowing the cause of the hyperglycemia is important before the hyperglycemia can be treated. Treatment may include, but is not be limited to:  Education.  Change or adjustment in medications.  Change or adjustment in meal plan.  Treatment for an illness, infection, etc.  More frequent blood glucose monitoring.  Change in exercise plan.  Decreasing or stopping steroids.  Lifestyle changes. HOME CARE INSTRUCTIONS  Test your blood glucose as directed.  Exercise regularly. Your caregiver will give you instructions about exercise. Pre-diabetes or diabetes which comes on with stress is helped by exercising.  Eat wholesome, balanced meals. Eat often and at regular, fixed times. Your  caregiver or nutritionist will give you a meal plan to guide your sugar intake.  Being at an ideal weight is important. If needed, losing as little as 10 to 15 pounds may help improve blood glucose levels. SEEK MEDICAL CARE IF:   You have questions about medicine, activity, or diet.  You continue to have symptoms (problems such as increased thirst, urination, or weight gain). SEEK IMMEDIATE MEDICAL CARE IF:   You are vomiting or have diarrhea.  Your breath smells fruity.  You are breathing faster or slower.  You are very sleepy or incoherent.  You have numbness, tingling, or pain in your feet or hands.  You have chest pain.  Your symptoms get worse even though you have been following your caregiver's orders.  If you have any other questions or concerns.   This information is not intended to replace advice given to you by your health care provider. Make sure you discuss any questions you have with your health care provider.   Document Released: 12/15/2000 Document Revised: 09/13/2011 Document Reviewed: 02/25/2015 Elsevier Interactive Patient Education [employer].

## 2015-05-23 NOTE — ED Provider Notes (Signed)
CSN: [id number]     Arrival date & time 05/23/15  1128 History   First MD Initiated Contact with Patient 05/23/15 1143     Chief Complaint  Patient presents with  . Hyperglycemia     (Consider location/radiation/quality/duration/timing/severity/associated sxs/prior Treatment) HPI   49 year old female with history of insulin-dependent diabetes, hypertension, and heart murmur who is recommended by her PCP to come to ER for further management of her hyperglycemia. Patient states that she has history of uncontrolled diabetes and her CBG is normally between 300-400 despite taking her insulin as prescribed. She was seen by her PCP for her regular physical checkup yesterday. Today she received a call from her doctor stating that her blood sugar was in the 700s and her A1c is 17. Patient admits that she has had polyuria and polydipsia but denies generalized fatigue and denies having changes in her energy level. She has been monitoring in her diet. She denies any recent sickness. Several weeks ago her doctor did prescribe a diuretic to help regulate her blood pressure seems to be working fine. She has had prior hospitalization due to having hyperglycemia. She is a type II diabetic. She did not take her insulin this morning.  Past Medical History  Diagnosis Date  . Diabetes mellitus   . Hypertension   . Heart murmur    Past Surgical History  Procedure Laterality Date  . Abdominal hysterectomy    . Tubal ligation    . Tonsillectomy    . Cesarean section    . Ganglion cyst excision    . Eye surgery Left retinal  . Cholecystectomy     Family History  Problem Relation Age of Onset  . Diabetes Father   . Heart attack Father    Social History  Substance Use Topics  . Smoking status: Never Smoker   . Smokeless tobacco: Never Used  . Alcohol Use: No   OB History    No data available     Review of Systems  All other systems reviewed and are negative.     Allergies  Tylenol  Home  Medications   Prior to Admission medications   Medication Sig Start Date End Date Taking? Authorizing Provider  amLODipine (NORVASC) 5 MG tablet Take 1 tablet (5 mg total) by mouth daily. 05/03/15   Eulas Post, MD  atorvastatin (LIPITOR) 20 MG tablet Take 1 tablet (20 mg total) by mouth daily. 10/11/14   Biagio Borg, MD  brimonidine (ALPHAGAN) 0.2 % ophthalmic solution INSTILL 1 DROP IN THE LEFT EYE BID STARTING 48 HOURS PRIOR TO SURGERY 01/30/15   Historical Provider, MD  CYCLOGYL 1 % ophthalmic solution INSTILL 1 DROP IN THE LEFT EYE TID STARTING 24 HOURS PRIOR TO SURGERY 01/30/15   Historical Provider, MD  furosemide (LASIX) 40 MG tablet TAKE 1 AND 1/2 TABLETS(60 MG) BY MOUTH DAILY 05/03/15   Eulas Post, MD  glucose blood (ONETOUCH VERIO) test strip Use as instructed to check blood sugar 3 times per day dx code E11.65 11/20/14   Elayne Snare, MD  Insulin Glargine (TOUJEO SOLOSTAR) 300 UNIT/ML SOPN Inject 35 Units into the skin at bedtime. 11/20/14   Elayne Snare, MD  Insulin Pen Needle (BD PEN NEEDLE NANO U/F) 32G X 4 MM MISC Use to inject insulin 2 times per day 10/31/14   Elayne Snare, MD  insulin regular human CONCENTRATED (HUMULIN R U-500 KWIKPEN) 500 UNIT/ML SOLN injection Inject 18 units in the morning and 14 units in the evening 10/31/14  Elayne Snare, MD  ketorolac (ACULAR) 0.5 % ophthalmic solution INSTILL 1 DROP IN THE LEFT EYE QID STARTING 1 WEEK PRIOR TO SURGERY 01/30/15   Historical Provider, MD  Linaclotide (LINZESS) 290 MCG CAPS capsule Take 1 capsule (290 mcg total) by mouth daily. 05/03/15   Eulas Post, MD  losartan (COZAAR) 100 MG tablet Take 1 tablet (100 mg total) by mouth daily. 10/09/14   Biagio Borg, MD  ofloxacin (OCUFLOX) 0.3 % ophthalmic solution INSTILL 1 DROP IN OU QID STARTING 72 HOURS PRIOR TO SURGERY 01/30/15   Historical Provider, MD  prednisoLONE acetate (PRED FORTE) 1 % ophthalmic suspension INSTILL 1 DROP IN THE LEFT EYE QID STARTING AFTER SURGERY 01/30/15    Historical Provider, MD  pregabalin (LYRICA) 100 MG capsule TAKE 1 CAPSULE BY MOUTH THREE TIMES DAILY AFTER MEALS 05/22/15   Biagio Borg, MD  vitamin B-12 (CYANOCOBALAMIN) 1000 MCG tablet Take 1,000 mcg by mouth 2 (two) times daily.    Historical Provider, MD   BP 145/84 mmHg  Pulse 63  Temp(Src) 98.3 F (36.8 C) (Oral)  Resp 16  SpO2 100% Physical Exam  Constitutional: She is oriented to person, place, and time. She appears well-developed and well-nourished. No distress.  African-American female appears to be in no acute discomfort.  HENT:  Head: Atraumatic.  Mouth/Throat: Oropharynx is clear and moist.  Eyes: Conjunctivae are normal.  Neck: Neck supple.  Cardiovascular: Normal rate and regular rhythm.   Pulmonary/Chest: Effort normal and breath sounds normal.  Abdominal: Soft. There is no tenderness.  Neurological: She is alert and oriented to person, place, and time.  Skin: No rash noted.  Psychiatric: She has a normal mood and affect.  Nursing note and vitals reviewed.   ED Course  Procedures (including critical care time)  Uncontrolled diabetic patient who had a blood sugar greater than 700 yesterday at her doctor's office for regular checkup. She has an A1c of 17. Today her CBG is 439. This appears to be at baseline according to patient. Workup initiated, IV fluid and subQ insulin given.  2:52 PM After receiving 2 L of normal saline and 10 units of insulin, blood sugar improved to 3:33. Patient has normal anion gap. UA without evidence of ketones. No evidence suggest DKA at this time. Patient felt better. She will follow-up closely with her primary care Dr. for further management. Patient was then she would need to have a rigorous schedule of diet, exercise, and better blood sugar control then her current management. This can be done in an outpatient setting. Return precaution discussed.  Labs Review Labs Reviewed  BASIC METABOLIC PANEL - Abnormal; Notable for the  following:    Sodium 134 (*)    Chloride 99 (*)    Glucose, Bld 499 (*)    Calcium 8.6 (*)    All other components within normal limits  URINALYSIS, ROUTINE W REFLEX MICROSCOPIC (NOT AT [facility]) - Abnormal; Notable for the following:    Specific Gravity, Urine 1.037 (*)    Glucose, UA >1000 (*)    Hgb urine dipstick TRACE (*)    Protein, ur 30 (*)    All other components within normal limits  URINE MICROSCOPIC-ADD ON - Abnormal; Notable for the following:    Squamous Epithelial / LPF 0-5 (*)    Bacteria, UA RARE (*)    All other components within normal limits  CBG MONITORING, ED - Abnormal; Notable for the following:    Glucose-Capillary 439 (*)  All other components within normal limits  CBG MONITORING, ED - Abnormal; Notable for the following:    Glucose-Capillary 333 (*)    All other components within normal limits  CBC  CBG MONITORING, ED    Imaging Review Dg Chest 2 View  05/23/2015  CLINICAL DATA:  Chest pain EXAM: CHEST  2 VIEW COMPARISON:  01/01/2015 FINDINGS: Cardiomediastinal silhouette is stable. No acute infiltrate or pleural effusion. No pulmonary edema. Bony thorax is unremarkable. IMPRESSION: No active cardiopulmonary disease. Electronically Signed   By: Lahoma Crocker M.D.   On: 05/23/2015 08:38   I have personally reviewed and evaluated these images and lab results as part of my medical decision-making.   EKG Interpretation None      MDM   Final diagnoses:  Type 2 diabetes mellitus with hyperglycemia, with long-term current use of insulin (HCC)    BP 154/102 mmHg  Pulse 63  Temp(Src) 98.3 F (36.8 C) (Oral)  Resp 17  SpO2 99%     [name], PA-C 05/23/15 [place], MD 05/25/15 1931

## 2015-05-23 NOTE — Telephone Encounter (Signed)
Pt spouse called in. I gave him information about the critical result and advised the same as listed below. Offered any help and service needed to help this pt.  Spouse stated understanding.

## 2015-05-23 NOTE — ED Notes (Signed)
Pt here after seeing PCP and having elevated HgA1C and blood sugar. Is type 2 diabetic and taking sliding scale insulin.

## 2015-05-23 NOTE — Telephone Encounter (Signed)
Pt called back. Informed her of the results and the long term effects of sugars not being controlled. Offered help and assistance at any time. Informed that a referral has been placed for endocrinology to further assist with getting her diabetes under control.

## 2015-05-24 NOTE — Assessment & Plan Note (Signed)
For lyrica re-start with ongoing pain,  to f/u any worsening symptoms or concerns

## 2015-05-24 NOTE — Assessment & Plan Note (Signed)
Mild elev, to re-start meds today, o/w stable overall by history and exam, recent data reviewed with pt, and pt to continue medical treatment as before,  to f/u any worsening symptoms or concerns BP Readings from Last 3 Encounters:  05/23/15 154/102  05/22/15 136/86  05/03/15 170/108

## 2015-05-24 NOTE — Assessment & Plan Note (Signed)
stable overall by history and exam, recent data reviewed with pt, and pt to continue medical treatment as before,  to f/u any worsening symptoms or concerns, for a1c./labs today

## 2015-05-28 ENCOUNTER — Encounter: Payer: Self-pay | Admitting: Internal Medicine

## 2015-06-19 ENCOUNTER — Emergency Department (HOSPITAL_COMMUNITY): Payer: PRIVATE HEALTH INSURANCE

## 2015-06-19 ENCOUNTER — Encounter (HOSPITAL_COMMUNITY): Payer: Self-pay | Admitting: Neurology

## 2015-06-19 ENCOUNTER — Emergency Department (HOSPITAL_COMMUNITY)
Admission: EM | Admit: 2015-06-19 | Discharge: 2015-06-19 | Disposition: A | Discharge status: Home / Self Care | Payer: PRIVATE HEALTH INSURANCE | Attending: Emergency Medicine | Admitting: Emergency Medicine

## 2015-06-19 DIAGNOSIS — Z794 Long term (current) use of insulin: Secondary | ICD-10-CM | POA: Diagnosis not present

## 2015-06-19 DIAGNOSIS — R079 Chest pain, unspecified: Secondary | ICD-10-CM | POA: Insufficient documentation

## 2015-06-19 DIAGNOSIS — I10 Essential (primary) hypertension: Secondary | ICD-10-CM | POA: Diagnosis not present

## 2015-06-19 DIAGNOSIS — E1165 Type 2 diabetes mellitus with hyperglycemia: Secondary | ICD-10-CM | POA: Insufficient documentation

## 2015-06-19 DIAGNOSIS — R739 Hyperglycemia, unspecified: Secondary | ICD-10-CM

## 2015-06-19 DIAGNOSIS — Z79899 Other long term (current) drug therapy: Secondary | ICD-10-CM | POA: Insufficient documentation

## 2015-06-19 DIAGNOSIS — R011 Cardiac murmur, unspecified: Secondary | ICD-10-CM | POA: Diagnosis not present

## 2015-06-19 HISTORY — DX: Nonrheumatic mitral (valve) insufficiency: I34.0

## 2015-06-19 LAB — BASIC METABOLIC PANEL
Anion gap: 12 (ref 5–15)
BUN: 12 mg/dL (ref 6–20)
CO2: 26 mmol/L (ref 22–32)
Calcium: 8.8 mg/dL — ABNORMAL LOW (ref 8.9–10.3)
Chloride: 93 mmol/L — ABNORMAL LOW (ref 101–111)
Creatinine, Ser: 1.21 mg/dL — ABNORMAL HIGH (ref 0.44–1.00)
GFR calc Af Amer: >60 mL/min (ref >60)
GFR calc non Af Amer: 52 mL/min — ABNORMAL LOW (ref >60)
Glucose, Bld: 786 mg/dL (ref 65–99)
Potassium: 3.6 mmol/L (ref 3.5–5.1)
Sodium: 131 mmol/L — ABNORMAL LOW (ref 135–145)

## 2015-06-19 LAB — CBG MONITORING, ED
Glucose-Capillary: >600 mg/dL (ref 65–99)
Glucose-Capillary: 433 mg/dL — ABNORMAL HIGH (ref 65–99)
Glucose-Capillary: 312 mg/dL — ABNORMAL HIGH (ref 65–99)

## 2015-06-19 LAB — I-STAT TROPONIN, ED
Troponin i, poc: 0.01 ng/mL (ref 0.00–0.08)
Troponin i, poc: 0.01 ng/mL (ref 0.00–0.08)

## 2015-06-19 LAB — CBC
HCT: 39.7 % (ref 36.0–46.0)
Hemoglobin: 12.9 g/dL (ref 12.0–15.0)
MCH: 27 pg (ref 26.0–34.0)
MCHC: 32.5 g/dL (ref 30.0–36.0)
MCV: 83.1 fL (ref 78.0–100.0)
Platelets: 154 10*3/uL (ref 150–400)
RBC: 4.78 MIL/uL (ref 3.87–5.11)
RDW: 12.8 % (ref 11.5–15.5)
WBC: 4.9 10*3/uL (ref 4.0–10.5)

## 2015-06-19 MED ORDER — INSULIN NPH (HUMAN) (ISOPHANE) 100 UNIT/ML ~~LOC~~ SUSP: 18.0000 [IU] | Freq: Two times a day (BID) | SUBCUTANEOUS | Status: DC

## 2015-06-19 MED ORDER — SODIUM CHLORIDE 0.9 % IV SOLN
INTRAVENOUS | Status: DC
  Administered 2015-06-19: 5.4 [IU]/h via INTRAVENOUS
  Filled 2015-06-19: qty 2.5

## 2015-06-19 NOTE — ED Notes (Signed)
Pt states that she didn't eat today due to nausea and did not take her insulin.

## 2015-06-19 NOTE — ED Notes (Signed)
Pt verbalized understanding of d/c instructions and has no further questions. Pt stable and NAD. Pt given refill for prescription of insulin.

## 2015-06-19 NOTE — ED Notes (Signed)
CBG 433 

## 2015-06-19 NOTE — ED Notes (Signed)
Pt reports today after work had a sharp pain to her chest; yesterday she was sick and didn't go to work due to [employer]. Reports heaviness to chest. Is a x 4.

## 2015-06-19 NOTE — ED Notes (Signed)
CBG 312, Dr Canary Brim ordered to d/c insulin drop and glucose stabilizer, pt to be d/c home.

## 2015-06-19 NOTE — Discharge Instructions (Signed)
Return to the ED with any concerns including vomiting, difficulty breathing, fainting, worsening chest pain, leg swelling, decreased level of alertness/lethargy, or any other alarming symptoms

## 2015-06-19 NOTE — ED Provider Notes (Signed)
CSN: [id number]     Arrival date & time 06/19/15  1757 History   First MD Initiated Contact with Patient 06/19/15 1954     Chief Complaint  Patient presents with  . Chest Pain     (Consider location/radiation/quality/duration/timing/severity/associated sxs/prior Treatment) HPI  Pt presenting with c/o sharp pain in chest today which has resolved.  She also has been having nausea and vomiting and associated generalized fatigue.  She has not been taking her insulin for the past several days, she ran out of her humulin.  And has been taking her truveo intermittently.  No vomiting.  No dysuria.  No abdominal pain.  She also states she has a burning sensation in her chest when she lies down at night.  No shortness of breath.  No hx of DVT/PE, no leg swelling or recent travel.  There are no other associated systemic symptoms, there are no other alleviating or modifying factors.   Past Medical History  Diagnosis Date  . Diabetes mellitus   . Hypertension   . Heart murmur   . Mitral regurgitation    Past Surgical History  Procedure Laterality Date  . Abdominal hysterectomy    . Tubal ligation    . Tonsillectomy    . Cesarean section    . Ganglion cyst excision    . Eye surgery Left retinal  . Cholecystectomy     Family History  Problem Relation Age of Onset  . Diabetes Father   . Heart attack Father    Social History  Substance Use Topics  . Smoking status: Never Smoker   . Smokeless tobacco: Never Used  . Alcohol Use: No   OB History    No data available     Review of Systems  ROS reviewed and all otherwise negative except for mentioned in HPI    Allergies  Tylenol  Home Medications   Prior to Admission medications   Medication Sig Start Date End Date Taking? Authorizing Provider  amLODipine (NORVASC) 5 MG tablet Take 1 tablet (5 mg total) by mouth daily. 05/03/15  Yes Eulas Post, MD  atorvastatin (LIPITOR) 20 MG tablet Take 1 tablet (20 mg total) by mouth  daily. 10/11/14  Yes Biagio Borg, MD  furosemide (LASIX) 40 MG tablet TAKE 1 AND 1/2 TABLETS(60 MG) BY MOUTH DAILY 05/03/15  Yes Eulas Post, MD  Insulin Glargine (TOUJEO SOLOSTAR) 300 UNIT/ML SOPN Inject 35 Units into the skin at bedtime. 11/20/14  Yes Elayne Snare, MD  Linaclotide Rolan Lipa) 290 MCG CAPS capsule Take 1 capsule (290 mcg total) by mouth daily. 05/03/15  Yes Eulas Post, MD  losartan (COZAAR) 100 MG tablet Take 1 tablet (100 mg total) by mouth daily. 10/09/14  Yes Biagio Borg, MD  pregabalin (LYRICA) 100 MG capsule TAKE 1 CAPSULE BY MOUTH THREE TIMES DAILY AFTER MEALS 05/22/15  Yes Biagio Borg, MD  insulin NPH Human (HUMULIN N) 100 UNIT/ML injection Inject 0.18 mLs (18 Units total) into the skin 2 (two) times daily. Take 18 units in the AM and 14 units in the PM 06/19/15   Alfonzo Beers, MD   BP 137/86 mmHg  Pulse 65  Temp(Src) 98 F (36.7 C) (Oral)  Resp 15  SpO2 98%  Vitals reviewed Physical Exam  Physical Examination: General appearance - alert, well appearing, and in no distress Mental status - alert, oriented to person, place, and time Eyes - no conjunctival injection, no scleral icterus Mouth - mucous membranes moist, pharynx normal  without lesions Chest - clear to auscultation, no wheezes, rales or rhonchi, symmetric air entry Heart - normal rate, regular rhythm, normal S1, S2, no murmurs, rubs, clicks or gallops Abdomen - soft, nontender, nondistended, no masses or organomegaly Neurological - alert, oriented, normal speech Extremities - peripheral pulses normal, no pedal edema, no clubbing or cyanosis Skin - normal coloration and turgor, no rashes  ED Course  Procedures (including critical care time) Labs Review Labs Reviewed  BASIC METABOLIC PANEL - Abnormal; Notable for the following:    Sodium 131 (*)    Chloride 93 (*)    Glucose, Bld 786 (*)    Creatinine, Ser 1.21 (*)    Calcium 8.8 (*)    GFR calc non Af Amer 52 (*)    All other components  within normal limits  CBG MONITORING, ED - Abnormal; Notable for the following:    Glucose-Capillary >600 (*)    All other components within normal limits  CBG MONITORING, ED - Abnormal; Notable for the following:    Glucose-Capillary 433 (*)    All other components within normal limits  CBG MONITORING, ED - Abnormal; Notable for the following:    Glucose-Capillary 312 (*)    All other components within normal limits  CBC  I-STAT TROPOININ, ED  I-STAT TROPOININ, ED    Imaging Review Dg Chest 2 View  06/19/2015  CLINICAL DATA:  Anterior chest pain starting yesterday EXAM: CHEST  2 VIEW COMPARISON:  05/22/2015 FINDINGS: Cardiomediastinal silhouette is stable. No acute infiltrate or pleural effusion. No pulmonary edema. Bony thorax is unremarkable. IMPRESSION: No active cardiopulmonary disease. Electronically Signed   By: Lahoma Crocker M.D.   On: 06/19/2015 19:06   I have personally reviewed and evaluated these images and lab results as part of my medical decision-making.   EKG Interpretation   Date/Time:  Thursday June 19 2015 18:06:01 EST Ventricular Rate:  70 PR Interval:  162 QRS Duration: 90 QT Interval:  418 QTC Calculation: 451 R Axis:   111 Text Interpretation:  Normal sinus rhythm Right axis deviation Low voltage  QRS Abnormal ECG No significant change since last tracing Confirmed by  [facility]  MD, Wakeelah Solan [phone]) on 06/19/2015 7:57:18 PM      MDM   Final diagnoses:  Hyperglycemia  Chest pain, unspecified chest pain type     Heart score of 2.    11:28 PM blood sugar is now down to 312, no signs of DKA.  2 sets of troponins are negative.  EKG is unchanged from prior, CXR reassuring.  Pt agreeable with plan for discharge.  She requests refill of humulin.  Discussed the importance of taking insulin regularly and close followup with PMD. .  Discharged with strict return precautions.  Pt agreeable with plan.  Alfonzo Beers, MD 06/20/15 [phone]

## 2015-06-19 NOTE — ED Notes (Signed)
Pt informed this RN that for the last 2 weeks she has been getting heart burn when she lays down at night. Pt states that she has been taking tums with relief.

## 2015-07-03 ENCOUNTER — Other Ambulatory Visit: Payer: Self-pay | Admitting: Occupational Medicine

## 2015-07-03 ENCOUNTER — Ambulatory Visit: Payer: Self-pay

## 2015-07-03 DIAGNOSIS — M79644 Pain in right finger(s): Secondary | ICD-10-CM

## 2015-07-28 ENCOUNTER — Emergency Department (HOSPITAL_COMMUNITY): Payer: 59

## 2015-07-28 ENCOUNTER — Encounter (HOSPITAL_COMMUNITY): Payer: Self-pay | Admitting: Emergency Medicine

## 2015-07-28 ENCOUNTER — Inpatient Hospital Stay (HOSPITAL_COMMUNITY)
Admission: EM | Admit: 2015-07-28 | Discharge: 2015-07-30 | DRG: 639 | Disposition: A | Discharge status: Home / Self Care | Payer: 59 | Attending: Internal Medicine | Admitting: Internal Medicine

## 2015-07-28 DIAGNOSIS — E1140 Type 2 diabetes mellitus with diabetic neuropathy, unspecified: Secondary | ICD-10-CM | POA: Diagnosis present

## 2015-07-28 DIAGNOSIS — R42 Dizziness and giddiness: Secondary | ICD-10-CM | POA: Diagnosis not present

## 2015-07-28 DIAGNOSIS — I951 Orthostatic hypotension: Secondary | ICD-10-CM | POA: Diagnosis present

## 2015-07-28 DIAGNOSIS — R55 Syncope and collapse: Secondary | ICD-10-CM | POA: Diagnosis present

## 2015-07-28 DIAGNOSIS — Z794 Long term (current) use of insulin: Secondary | ICD-10-CM | POA: Diagnosis not present

## 2015-07-28 DIAGNOSIS — E876 Hypokalemia: Secondary | ICD-10-CM | POA: Diagnosis present

## 2015-07-28 DIAGNOSIS — Z90710 Acquired absence of both cervix and uterus: Secondary | ICD-10-CM

## 2015-07-28 DIAGNOSIS — Z888 Allergy status to other drugs, medicaments and biological substances status: Secondary | ICD-10-CM | POA: Diagnosis not present

## 2015-07-28 DIAGNOSIS — R739 Hyperglycemia, unspecified: Secondary | ICD-10-CM

## 2015-07-28 DIAGNOSIS — E1165 Type 2 diabetes mellitus with hyperglycemia: Secondary | ICD-10-CM | POA: Diagnosis not present

## 2015-07-28 DIAGNOSIS — I10 Essential (primary) hypertension: Secondary | ICD-10-CM | POA: Diagnosis present

## 2015-07-28 DIAGNOSIS — E118 Type 2 diabetes mellitus with unspecified complications: Secondary | ICD-10-CM | POA: Diagnosis not present

## 2015-07-28 DIAGNOSIS — Z79899 Other long term (current) drug therapy: Secondary | ICD-10-CM | POA: Diagnosis not present

## 2015-07-28 DIAGNOSIS — K219 Gastro-esophageal reflux disease without esophagitis: Secondary | ICD-10-CM | POA: Diagnosis present

## 2015-07-28 DIAGNOSIS — I340 Nonrheumatic mitral (valve) insufficiency: Secondary | ICD-10-CM | POA: Diagnosis present

## 2015-07-28 DIAGNOSIS — R51 Headache: Secondary | ICD-10-CM | POA: Diagnosis not present

## 2015-07-28 DIAGNOSIS — E860 Dehydration: Secondary | ICD-10-CM | POA: Diagnosis present

## 2015-07-28 DIAGNOSIS — R519 Headache, unspecified: Secondary | ICD-10-CM | POA: Diagnosis not present

## 2015-07-28 DIAGNOSIS — IMO0002 Reserved for concepts with insufficient information to code with codable children: Secondary | ICD-10-CM | POA: Diagnosis present

## 2015-07-28 HISTORY — DX: Syncope and collapse: R55

## 2015-07-28 HISTORY — DX: Family history of other specified conditions: Z84.89

## 2015-07-28 HISTORY — DX: Type 2 diabetes mellitus with diabetic neuropathy, unspecified: E11.40

## 2015-07-28 HISTORY — DX: Gastro-esophageal reflux disease without esophagitis: K21.9

## 2015-07-28 LAB — CBC WITH DIFFERENTIAL/PLATELET
Basophils Absolute: 0 10*3/uL (ref 0.0–0.1)
Basophils Relative: 0 %
Eosinophils Absolute: 0.1 10*3/uL (ref 0.0–0.7)
Eosinophils Relative: 1 %
HCT: 39.5 % (ref 36.0–46.0)
Hemoglobin: 13.2 g/dL (ref 12.0–15.0)
Lymphocytes Relative: 25 %
Lymphs Abs: 1.3 10*3/uL (ref 0.7–4.0)
MCH: 26.8 pg (ref 26.0–34.0)
MCHC: 33.4 g/dL (ref 30.0–36.0)
MCV: 80.3 fL (ref 78.0–100.0)
Monocytes Absolute: 0.3 10*3/uL (ref 0.1–1.0)
Monocytes Relative: 5 %
Neutro Abs: 3.7 10*3/uL (ref 1.7–7.7)
Neutrophils Relative %: 69 %
Platelets: 147 10*3/uL — ABNORMAL LOW (ref 150–400)
RBC: 4.92 MIL/uL (ref 3.87–5.11)
RDW: 12.6 % (ref 11.5–15.5)
WBC: 5.4 10*3/uL (ref 4.0–10.5)

## 2015-07-28 LAB — URINALYSIS, ROUTINE W REFLEX MICROSCOPIC
Bilirubin Urine: NEGATIVE
Glucose, UA: >1000 mg/dL — AB
Ketones, ur: NEGATIVE mg/dL
Leukocytes, UA: NEGATIVE
Nitrite: NEGATIVE
Protein, ur: NEGATIVE mg/dL
Specific Gravity, Urine: 1.02 (ref 1.005–1.030)
pH: 6 (ref 5.0–8.0)

## 2015-07-28 LAB — I-STAT TROPONIN, ED: Troponin i, poc: 0.01 ng/mL (ref 0.00–0.08)

## 2015-07-28 LAB — GLUCOSE, CAPILLARY
Glucose-Capillary: 122 mg/dL — ABNORMAL HIGH (ref 65–99)
Glucose-Capillary: 141 mg/dL — ABNORMAL HIGH (ref 65–99)
Glucose-Capillary: 230 mg/dL — ABNORMAL HIGH (ref 65–99)
Glucose-Capillary: 231 mg/dL — ABNORMAL HIGH (ref 65–99)
Glucose-Capillary: 219 mg/dL — ABNORMAL HIGH (ref 65–99)

## 2015-07-28 LAB — URINE MICROSCOPIC-ADD ON: Bacteria, UA: NONE SEEN

## 2015-07-28 LAB — COMPREHENSIVE METABOLIC PANEL
ALT: 15 U/L (ref 14–54)
AST: 18 U/L (ref 15–41)
Albumin: 3.4 g/dL — ABNORMAL LOW (ref 3.5–5.0)
Alkaline Phosphatase: 180 U/L — ABNORMAL HIGH (ref 38–126)
Anion gap: 16 — ABNORMAL HIGH (ref 5–15)
BUN: 11 mg/dL (ref 6–20)
CO2: 24 mmol/L (ref 22–32)
Calcium: 9.1 mg/dL (ref 8.9–10.3)
Chloride: 94 mmol/L — ABNORMAL LOW (ref 101–111)
Creatinine, Ser: 0.77 mg/dL (ref 0.44–1.00)
GFR calc Af Amer: >60 mL/min (ref >60)
GFR calc non Af Amer: >60 mL/min (ref >60)
Glucose, Bld: 678 mg/dL (ref 65–99)
Potassium: 3.5 mmol/L (ref 3.5–5.1)
Sodium: 134 mmol/L — ABNORMAL LOW (ref 135–145)
Total Bilirubin: 0.8 mg/dL (ref 0.3–1.2)
Total Protein: 6.5 g/dL (ref 6.5–8.1)

## 2015-07-28 LAB — CBG MONITORING, ED
Glucose-Capillary: 520 mg/dL — ABNORMAL HIGH (ref 65–99)
Glucose-Capillary: 391 mg/dL — ABNORMAL HIGH (ref 65–99)
Glucose-Capillary: 270 mg/dL — ABNORMAL HIGH (ref 65–99)
Glucose-Capillary: 230 mg/dL — ABNORMAL HIGH (ref 65–99)

## 2015-07-28 MED ORDER — DEXTROSE 50 % IV SOLN: 25.0000 mL | INTRAVENOUS | Status: DC | PRN

## 2015-07-28 MED ORDER — ONDANSETRON HCL 4 MG/2ML IJ SOLN
4.0000 mg | Freq: Once | INTRAMUSCULAR | Status: AC
  Administered 2015-07-28: 4 mg via INTRAVENOUS
  Filled 2015-07-28: qty 2

## 2015-07-28 MED ORDER — DOCUSATE SODIUM 100 MG PO CAPS
100.0000 mg | ORAL_CAPSULE | Freq: Two times a day (BID) | ORAL | Status: DC
  Administered 2015-07-28 – 2015-07-29 (×2): 100 mg via ORAL
  Filled 2015-07-28 (×4): qty 1

## 2015-07-28 MED ORDER — SODIUM CHLORIDE 0.9 % IV SOLN: INTRAVENOUS | Status: DC

## 2015-07-28 MED ORDER — SENNA 8.6 MG PO TABS
1.0000 | ORAL_TABLET | Freq: Two times a day (BID) | ORAL | Status: DC
  Administered 2015-07-28 – 2015-07-29 (×2): 8.6 mg via ORAL
  Filled 2015-07-28 (×4): qty 1

## 2015-07-28 MED ORDER — TRAMADOL HCL 50 MG PO TABS
50.0000 mg | ORAL_TABLET | Freq: Four times a day (QID) | ORAL | Status: DC | PRN
  Administered 2015-07-28 – 2015-07-29 (×3): 50 mg via ORAL
  Filled 2015-07-28 (×3): qty 1

## 2015-07-28 MED ORDER — SODIUM CHLORIDE 0.9 % IV SOLN
1000.0000 mL | INTRAVENOUS | Status: DC
  Administered 2015-07-28: 1000 mL via INTRAVENOUS

## 2015-07-28 MED ORDER — SODIUM CHLORIDE 0.9 % IV SOLN
INTRAVENOUS | Status: DC
  Filled 2015-07-28: qty 2.5

## 2015-07-28 MED ORDER — SODIUM CHLORIDE 0.9 % IV SOLN
1000.0000 mL | Freq: Once | INTRAVENOUS | Status: AC
  Administered 2015-07-28: 1000 mL via INTRAVENOUS

## 2015-07-28 MED ORDER — SODIUM CHLORIDE 0.9 % IV SOLN
INTRAVENOUS | Status: DC
  Administered 2015-07-28: 4.6 [IU]/h via INTRAVENOUS
  Filled 2015-07-28: qty 2.5

## 2015-07-28 MED ORDER — ONDANSETRON HCL 4 MG/2ML IJ SOLN
4.0000 mg | Freq: Four times a day (QID) | INTRAMUSCULAR | Status: DC | PRN
  Administered 2015-07-29 (×2): 4 mg via INTRAVENOUS
  Filled 2015-07-28 (×2): qty 2

## 2015-07-28 MED ORDER — SODIUM CHLORIDE 0.9 % IJ SOLN
3.0000 mL | Freq: Two times a day (BID) | INTRAMUSCULAR | Status: DC
Start: 2015-07-28 — End: 2015-07-30
  Administered 2015-07-28 – 2015-07-30 (×4): 3 mL via INTRAVENOUS

## 2015-07-28 MED ORDER — INSULIN REGULAR BOLUS VIA INFUSION
0.0000 [IU] | Freq: Three times a day (TID) | INTRAVENOUS | Status: DC
  Filled 2015-07-28: qty 10

## 2015-07-28 MED ORDER — INSULIN REGULAR HUMAN 100 UNIT/ML IJ SOLN
INTRAMUSCULAR | Status: DC
  Administered 2015-07-28: 2.1 [IU]/h via INTRAVENOUS
  Filled 2015-07-28: qty 2.5

## 2015-07-28 MED ORDER — ENOXAPARIN SODIUM 40 MG/0.4ML ~~LOC~~ SOLN
40.0000 mg | SUBCUTANEOUS | Status: DC
  Administered 2015-07-28 – 2015-07-29 (×2): 40 mg via SUBCUTANEOUS
  Filled 2015-07-28 (×2): qty 0.4

## 2015-07-28 MED ORDER — DEXTROSE-NACL 5-0.45 % IV SOLN: INTRAVENOUS | Status: DC

## 2015-07-28 MED ORDER — DEXTROSE-NACL 5-0.45 % IV SOLN
INTRAVENOUS | Status: DC
  Administered 2015-07-28 – 2015-07-29 (×2): via INTRAVENOUS

## 2015-07-28 MED ORDER — MORPHINE SULFATE (PF) 2 MG/ML IV SOLN
2.0000 mg | Freq: Once | INTRAVENOUS | Status: AC
  Administered 2015-07-28: 2 mg via INTRAVENOUS
  Filled 2015-07-28: qty 1

## 2015-07-28 MED ORDER — ONDANSETRON HCL 4 MG PO TABS
4.0000 mg | ORAL_TABLET | Freq: Four times a day (QID) | ORAL | Status: DC | PRN
  Administered 2015-07-28: 4 mg via ORAL
  Filled 2015-07-28: qty 1

## 2015-07-28 MED ORDER — SODIUM CHLORIDE 0.9 % IV SOLN: INTRAVENOUS | Status: AC

## 2015-07-28 MED ORDER — POTASSIUM CHLORIDE 10 MEQ/100ML IV SOLN: 10.0000 meq | INTRAVENOUS | Status: AC

## 2015-07-28 MED ORDER — ALUM & MAG HYDROXIDE-SIMETH 200-200-20 MG/5ML PO SUSP: 30.0000 mL | Freq: Four times a day (QID) | ORAL | Status: DC | PRN

## 2015-07-28 NOTE — ED Notes (Signed)
Pt to xray

## 2015-07-28 NOTE — ED Notes (Signed)
Pt remains in xray.

## 2015-07-28 NOTE — Progress Notes (Signed)
Noted consult for Diabetes Coordinator. In reviewing the chart, noted initial labs at 9:30 today resulted with Glucose of 678 mg/dl, CO2 24, AG 16, and urine ketones negative. Patient was ordered DKA Phase 1 order set. Talked with Dr. Marily Memos and recommended discontinuing current DKA order set and use IV insulin/GlucoStabilizer order set. Dr. Marily Memos to place new orders for IV insulin/GlucoStabilizer order set.  In reviewing th chart, noted patient had initial visit with Dr. Cathlean Cower on 05/22/15 to establish care. The office note by Dr. Jenny Reichmann on 05/22/15 listed Toujeo 35 units QHS and Humulin R U-500 (Concentrated insulin) 18 units in the morning and 14 units in the evening as patient's DM medications. However, in reviewing current outpatient medication list, noted that patient is taking NPH 14-18 units BID (18 units QAM and 14 units QPM) for diabetes. A1C was 19.0% on 05/22/2015 and noted patient was contacted by telephone on 05/23/15 by Donnamae Jude, CMA to inform patient of results and referral placed to endocrinology. Patient is currently still in the ED. Will plan to follow up with patient on 07/29/15 to clarify outpatient DM medications and to discuss diabetes and importance of improving glycemic control.  Thanks, Barnie Alderman, RN, MSN, CDE Diabetes Coordinator [facility] [phone] (Team Pager from [place] to [place]) [phone] ([facility] office) [phone] [facility] office) [phone] [facility] office)

## 2015-07-28 NOTE — ED Notes (Signed)
MD at bedside. 

## 2015-07-28 NOTE — ED Notes (Signed)
Pt's CBG result was 391. Informed Ashura - RN.

## 2015-07-28 NOTE — ED Notes (Signed)
CBG 537. Pt synopsized at work; did not give herself her insulin bc she did not eat breakfast (was waiting for her break at work); fell face forward; denying tenderness. Said she normally takes meds before breakfast. C/o R arm and knee pain. A&O; normal sinus rhythm; facial pain.

## 2015-07-28 NOTE — ED Notes (Addendum)
Pt continues to complain of elbow pain.

## 2015-07-28 NOTE — ED Notes (Signed)
RN called bed placement to confirm that bed has been requested.

## 2015-07-28 NOTE — ED Notes (Signed)
Report attempted x2

## 2015-07-28 NOTE — ED Provider Notes (Signed)
CSN: [id number]     Arrival date & time 07/28/15  0911 History   First MD Initiated Contact with Patient 07/28/15 0919     Chief Complaint  Patient presents with  . Loss of Consciousness     (Consider location/radiation/quality/duration/timing/severity/associated sxs/prior Treatment) HPI  50 year old female with diabetes, hypertension, and mitral regurgitation who presents today complaining of syncopal episode this a.m. at work.  History is limited by patient's lack of consciousness during the event. She states she was in her usual state of health this a.m. She was at work on a [employer]. 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 She states the next thing she remembers is being on the ground with people living onto her. She did not have any prodrome prior to the event. She did strike her face and has pain in her right elbow and left anterior chest since the event. The pain in the right arm is 8 out of 10. The pain in the left chest is 5 out of 10 and occurs with deep breathing. She also has a headache since the event. She did not have breakfast this morning and was waiting to take her insulin until she had a break. She did take her oral medications. She reports no recent illness, headache prior to event, chest pain prior to event, dyspnea or other changes in her recent usual health. He has not had similar events in the past  Past Medical History  Diagnosis Date  . Diabetes mellitus   . Hypertension   . Heart murmur   . Mitral regurgitation    Past Surgical History  Procedure Laterality Date  . Abdominal hysterectomy    . Tubal ligation    . Tonsillectomy    . Cesarean section    . Ganglion cyst excision    . Eye surgery Left retinal  . Cholecystectomy     Family History  Problem Relation Age of Onset  . Diabetes Father   . Heart attack Father    Social History  Substance Use Topics  . Smoking status: Never Smoker   . Smokeless tobacco: Never  Used  . Alcohol Use: No   OB History    No data available     Review of Systems  All other systems reviewed and are negative.     Allergies  Tylenol  Home Medications   Prior to Admission medications   Medication Sig Start Date End Date Taking? Authorizing Provider  amLODipine (NORVASC) 5 MG tablet Take 1 tablet (5 mg total) by mouth daily. 05/03/15   Eulas Post, MD  atorvastatin (LIPITOR) 20 MG tablet Take 1 tablet (20 mg total) by mouth daily. 10/11/14   Biagio Borg, MD  furosemide (LASIX) 40 MG tablet TAKE 1 AND 1/2 TABLETS(60 MG) BY MOUTH DAILY 05/03/15   Eulas Post, MD  Insulin Glargine (TOUJEO SOLOSTAR) 300 UNIT/ML SOPN Inject 35 Units into the skin at bedtime. 11/20/14   Elayne Snare, MD  insulin NPH Human (HUMULIN N) 100 UNIT/ML injection Inject 0.18 mLs (18 Units total) into the skin 2 (two) times daily. Take 18 units in the AM and 14 units in the PM 06/19/15   Alfonzo Beers, MD  Linaclotide [facility]) 290 MCG CAPS capsule Take 1 capsule (290 mcg total) by mouth daily. 05/03/15   Eulas Post, MD  losartan (COZAAR) 100 MG tablet Take 1 tablet (100 mg total) by mouth daily. 10/09/14   Hunt Oris  John, MD  pregabalin (LYRICA) 100 MG capsule TAKE 1 CAPSULE BY MOUTH THREE TIMES DAILY AFTER MEALS 05/22/15   Biagio Borg, MD   BP 190/94 mmHg  Pulse 72  Temp(Src) 98.1 F (36.7 C) (Oral)  Resp 16 Physical Exam  Constitutional: She is oriented to person, place, and time. She appears well-developed and well-nourished.  HENT:  Head: Normocephalic and atraumatic.    Right Ear: External ear normal.  Left Ear: External ear normal.  Nose: Nose normal.  Mouth/Throat: Oropharynx is clear and moist.  Eyes: Conjunctivae and EOM are normal. Pupils are equal, round, and reactive to light.  Neck: Normal range of motion. Neck supple.  Cardiovascular: Normal rate, regular rhythm, normal heart sounds and intact distal pulses.   Pulmonary/Chest: Effort normal and breath sounds  normal.    Abdominal: Soft. Bowel sounds are normal.  Musculoskeletal: Normal range of motion.       Arms: Neurological: She is alert and oriented to person, place, and time. She has normal reflexes.  Skin: Skin is warm and dry.  Psychiatric: She has a normal mood and affect. Her behavior is normal. Judgment and thought content normal.  Nursing note and vitals reviewed.   ED Course  Procedures (including critical care time) Labs Review Labs Reviewed  CBC WITH DIFFERENTIAL/PLATELET - Abnormal; Notable for the following:    Platelets 147 (*)    All other components within normal limits  COMPREHENSIVE METABOLIC PANEL - Abnormal; Notable for the following:    Sodium 134 (*)    Chloride 94 (*)    Glucose, Bld 678 (*)    Albumin 3.4 (*)    Alkaline Phosphatase 180 (*)    Anion gap 16 (*)    All other components within normal limits  URINALYSIS, ROUTINE W REFLEX MICROSCOPIC (NOT AT [facility])  I-STAT TROPOININ, ED  POCT CBG (FASTING - GLUCOSE)-MANUAL ENTRY    Imaging Review Dg Chest 2 View  07/28/2015  CLINICAL DATA:  Syncope, loss of consciousness. EXAM: CHEST  2 VIEW COMPARISON:  06/19/2015 FINDINGS: The heart size and mediastinal contours are within normal limits. Both lungs are clear. The visualized skeletal structures are unremarkable. IMPRESSION: No active cardiopulmonary disease. Electronically Signed   By: Rolm Baptise M.D.   On: 07/28/2015 10:15   Dg Elbow Complete Right  07/28/2015  CLINICAL DATA:  Syncope.  Right elbow pain posteriorly EXAM: RIGHT ELBOW - COMPLETE 3+ VIEW COMPARISON:  None. FINDINGS: There is no evidence of fracture, dislocation, or joint effusion. There is no evidence of arthropathy or other focal bone abnormality. Soft tissues are unremarkable. IMPRESSION: Negative. Electronically Signed   By: Rolm Baptise M.D.   On: 07/28/2015 10:15   Ct Head Wo Contrast  07/28/2015  CLINICAL DATA:  Fall, lost consciousness at work this morning. Headache. EXAM: CT HEAD  WITHOUT CONTRAST TECHNIQUE: Contiguous axial images were obtained from the base of the skull through the vertex without intravenous contrast. COMPARISON:  10/14/2013 FINDINGS: The brainstem, cerebellum, cerebral peduncles, thalami, basal ganglia, basilar cisterns, and ventricular system appear within normal limits. No intracranial hemorrhage, mass lesion, or acute CVA. IMPRESSION: 1.  No significant abnormality identified. Electronically Signed   By: Van Clines M.D.   On: 07/28/2015 10:34   I have personally reviewed and evaluated these images and lab results as part of my medical decision-making.   EKG Interpretation   Date/Time:  Monday July 28 2015 09:25:12 EST Ventricular Rate:  69 PR Interval:  164 QRS Duration: 94 QT Interval:  438 QTC Calculation: 469 R Axis:   36 Text Interpretation:  Sinus rhythm qwave v1-v2  Poor R wave progression  ekg has changed since last ekg of 06/19/15 Confirmed by Japji Kok MD, Andee Poles  [phone]) on 07/28/2015 9:30:45 AM      MDM   Final diagnoses:  Syncope and collapse  Hyperglycemia    50 year old female insulin-dependent hypertensive presents with syncopal event without prodrome. Given patient's risks for coronary artery disease, I am concerned that this could represent a significant arrhythmia. Plan admission for further evaluation.  1- syncope- no definite etiology.  Sudden onset- no report of seizure activity or postictal phase, patient reports no other associated symptoms. Orthostatics are pending. EKG shows Q waves in V1 and V2 with poor R-wave progression indicates indicating possible previous ischemia, but no evidence of acute ischemia is noted today. Troponin is normal on initial evaluation. Patient reports no dyspnea. She does have some left-sided anterior chest pain but this does appear musculoskeletal as it occurred after the event and is reproducible with palpation.  2- hyperglycemia patient's blood sugar is elevated here at 678. She  reports not taking her morning insulin. There is no evidence of DKA. She is being hydrated and started on the glucose stabilizer regimen 3- hypertension-patient with known history of hyper tension with blood pressures ranging systolically [id number] and diastolically [id number] here in ED. Discussed with Ms. Wertman on for hospitalist and plan admission to telemetry bed  Pattricia Boss, MD 07/30/15 1258

## 2015-07-28 NOTE — ED Notes (Signed)
Gave pt water, per Phillis Haggis - RN

## 2015-07-28 NOTE — ED Notes (Signed)
Pt's CBG result was 270. Informed Elizabeth - RN.

## 2015-07-28 NOTE — ED Notes (Signed)
Pt returned from xray and CT; ambulated to restroom without distress. Reports she feels sore from fall.

## 2015-07-28 NOTE — ED Notes (Signed)
Pt's CBG result was 520. Informed Ashura - RN.

## 2015-07-28 NOTE — ED Notes (Signed)
Pt's CBG result was 230. Informed Cassie - RN.

## 2015-07-28 NOTE — H&P (Signed)
[facility] History and Physical  [name] [name] [id number] DOB: Mar 09, 1966 DOA: 07/28/2015  Referring physician:  PCP: Cathlean Cower, MD   Chief Complaint: Syncopal episode  HPI: [name] [name] is a 50 y.o. female with a history of DM on insulin brought to the ED due to syncopal episode. 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 Prodromal symptoms included nausea and vomiting x2. She denied any vision changes or confusion. She reported intermittent parietal headaches. No apparent seizure activity was noted.She reports being unconscious for about 15 mins. No other prodromal symptoms. She denies any shortness of breath, chest pain or palpitations,but she carries a history of irregular heartbeat. She denied any numbness or tingling.Patient did not take her diabetes or other oral meds this morning prior to admission. Labs at the ED, show blood sugars at 678, with normal anion gap not suggestive of DKA. She is being hydrated and has already received insulin, starting on glucose stabilizer. CXR is pending. EKG shows Q waves in V1 and V2 with poor R-wave progression indicates indicating possible previous ischemia, but no evidence of acute ischemia is noted today. Troponin is normal to date. CT head is negative for acute intracranial abnormalities. Other labs are unremarkable.   Review of Systems:  Constitutional:  No weight loss, night sweats, Fevers, chills, fatigue.  HEENT: Positive for parietal, intermittent headaches, denies difficulty swallowing, tooth/dental problems,sore throat. No sneezing, itching, ear ache, nasal congestion, post nasal drip,  Cardio-vascular: No chest pain, Orthopnea, PND, swelling in lower extremities, anasarca, dizziness, palpitations  GI: No heartburn, indigestion, abdominal pain. She had nausea, vomiting x2, no diarrhea, change in bowel habits or loss of appetite  Resp: No shortness of breath with exertion or at rest. No  excess mucus, no productive cough, No non-productive cough, No coughing up of blood.No change in color of mucus. No wheezing. No chest wall deformity  Skin: no rash or lesions.  GU: no dysuria, change in color of urine, no urgency or frequency. No flank pain.  Musculoskeletal: No joint pain or swelling. No decreased range of motion. No back pain. She reports left arm pain after falling, likely due to bruising Neuro: history of diabetic neuropathy Psych: No change in mood or affect. No depression or anxiety. No memory loss.   Past Medical History  Diagnosis Date  . Diabetes mellitus   . Hypertension   . Heart murmur   . Mitral regurgitation    Past Surgical History  Procedure Laterality Date  . Abdominal hysterectomy    . Tubal ligation    . Tonsillectomy    . Cesarean section    . Ganglion cyst excision    . Eye surgery Left retinal  . Cholecystectomy     Social History:  reports that she has never smoked. She has never used smokeless tobacco. She reports that she does not drink alcohol or use illicit drugs. Lives with husband, no children. She works at [employer]. She ambulates independently without assistance  Allergies  Allergen Reactions  . Toujeo Solostar [Insulin Glargine] Nausea And Vomiting  . Tylenol [Acetaminophen] Itching    Family History  Problem Relation Age of Onset  . Diabetes Father   . Heart attack Father     Prior to Admission medications   Medication Sig Start Date End Date Taking? Authorizing Provider  amLODipine (NORVASC) 5 MG tablet Take 1 tablet (5 mg total) by mouth daily. 05/03/15   Eulas Post, MD  atorvastatin (  LIPITOR) 20 MG tablet Take 1 tablet (20 mg total) by mouth daily. 10/11/14   Biagio Borg, MD  furosemide (LASIX) 40 MG tablet TAKE 1 AND 1/2 TABLETS(60 MG) BY MOUTH DAILY 05/03/15   Eulas Post, MD  Insulin Glargine (TOUJEO SOLOSTAR) 300 UNIT/ML SOPN Inject 35 Units into the skin at bedtime. 11/20/14   Elayne Snare, MD  insulin  NPH Human (HUMULIN N) 100 UNIT/ML injection Inject 0.18 mLs (18 Units total) into the skin 2 (two) times daily. Take 18 units in the AM and 14 units in the PM 06/19/15   Alfonzo Beers, MD  Linaclotide [facility]) 290 MCG CAPS capsule Take 1 capsule (290 mcg total) by mouth daily. 05/03/15   Eulas Post, MD  losartan (COZAAR) 100 MG tablet Take 1 tablet (100 mg total) by mouth daily. 10/09/14   Biagio Borg, MD  pregabalin (LYRICA) 100 MG capsule TAKE 1 CAPSULE BY MOUTH THREE TIMES DAILY AFTER MEALS 05/22/15   Biagio Borg, MD   Physical Exam: Filed Vitals:   07/28/15 [id number] 07/28/15 0940 07/28/15 0943  BP: 190/94 159/97   Pulse: 72    Temp: 98.1 F (36.7 C)    TempSrc: Oral    Resp: 16 15   SpO2:   100%    Wt Readings from Last 3 Encounters:  05/22/15 75.297 kg (166 lb)  05/03/15 75.297 kg (166 lb)  03/13/15 75.297 kg (166 lb)    General:  Appears calm and comfortable Eyes: PERRL, normal lids, irises & conjunctiva ENT: grossly normal hearing, lips & tongue Neck: no LAD, masses or thyromegaly Cardiovascular: RRR, no m/r/g. No LE edema. Telemetry: SR, no arrhythmias  Respiratory: CTA bilaterally, no w/r/r. Normal respiratory effort. Abdomen: soft, ntnd, positive bowel sounds Skin: no rash or induration seen on limited exam. Some bruising noted post fall at the chin area Musculoskeletal: grossly normal tone BUE/BLE Psychiatric: grossly normal mood and affect, speech fluent and appropriate Neurologic: grossly non-focal.          Labs on Admission:  Basic Metabolic Panel:  Recent Labs Lab 07/28/15 0930  NA 134*  K 3.5  CL 94*  CO2 24  GLUCOSE 678*  BUN 11  CREATININE 0.77  CALCIUM 9.1   Liver Function Tests:  Recent Labs Lab 07/28/15 0930  AST 18  ALT 15  ALKPHOS 180*  BILITOT 0.8  PROT 6.5  ALBUMIN 3.4*   CBC:  Recent Labs Lab 07/28/15 0930  WBC 5.4  NEUTROABS 3.7  HGB 13.2  HCT 39.5  MCV 80.3  PLT 147*   Cardiac Enzymes: No results for  input(s): CKTOTAL, CKMB, CKMBINDEX, TROPONINI in the last 168 hours.  BNP (last 3 results)  Recent Labs  01/01/15 1316  BNP 16.9      Recent Labs Lab 07/28/15 1116  GLUCAP 520*    Radiological Exams on Admission: Dg Chest 2 View  07/28/2015  CLINICAL DATA:  Syncope, loss of consciousness. EXAM: CHEST  2 VIEW COMPARISON:  06/19/2015 FINDINGS: The heart size and mediastinal contours are within normal limits. Both lungs are clear. The visualized skeletal structures are unremarkable. IMPRESSION: No active cardiopulmonary disease. Electronically Signed   By: Rolm Baptise M.D.   On: 07/28/2015 10:15   Dg Elbow Complete Right  07/28/2015  CLINICAL DATA:  Syncope.  Right elbow pain posteriorly EXAM: RIGHT ELBOW - COMPLETE 3+ VIEW COMPARISON:  None. FINDINGS: There is no evidence of fracture, dislocation, or joint effusion. There is no evidence of arthropathy or  other focal bone abnormality. Soft tissues are unremarkable. IMPRESSION: Negative. Electronically Signed   By: Rolm Baptise M.D.   On: 07/28/2015 10:15   Ct Head Wo Contrast  07/28/2015  CLINICAL DATA:  Fall, lost consciousness at work this morning. Headache. EXAM: CT HEAD WITHOUT CONTRAST TECHNIQUE: Contiguous axial images were obtained from the base of the skull through the vertex without intravenous contrast. COMPARISON:  10/14/2013 FINDINGS: The brainstem, cerebellum, cerebral peduncles, thalami, basal ganglia, basilar cisterns, and ventricular system appear within normal limits. No intracranial hemorrhage, mass lesion, or acute CVA. IMPRESSION: 1.  No significant abnormality identified. Electronically Signed   By: Van Clines M.D.   On: 07/28/2015 10:34     Assessment/Plan  Syncope/Dizziness LIkely due to hyperglycemia Will continue to monitor upon admission to rule out any cardiac component, as patient reports a history of "irregular heart beat" Last 2 D echo on 09/19/13 was essentially normal with EFin the range of  55% to 60% with exception of mild to moderate regurgitation Will monitor with telemetry Consider 2 D echo   DM (diabetes mellitus), type 2, uncontrolled ([place]) Her blood glucose on admission was 678 She received IV insulin and IVF on presentation Continue glucose stabilizer  Check A1C Diabetes coordinator and dietician consultations.  Chronic headaches She had headaches prior to the event, with CT head today negative for acute intracranial abnormalities  Hypertension.   Blood pressure controlled in the emergency department.  Home medications include Lasix, Norvasc and Cozaar Will continue prior to admission meds  Code Status:  Full Code  DVT Prophylaxis: On Lovenox  Family Communication:  Husband at bedside Disposition Plan: anticipated length of stay is 1-2 days Time spent:   [facility] Pager [phone]

## 2015-07-29 ENCOUNTER — Ambulatory Visit (HOSPITAL_COMMUNITY): Payer: 59

## 2015-07-29 ENCOUNTER — Encounter (HOSPITAL_COMMUNITY): Payer: Self-pay | Admitting: General Practice

## 2015-07-29 DIAGNOSIS — R51 Headache: Secondary | ICD-10-CM

## 2015-07-29 DIAGNOSIS — R55 Syncope and collapse: Secondary | ICD-10-CM | POA: Insufficient documentation

## 2015-07-29 DIAGNOSIS — R519 Headache, unspecified: Secondary | ICD-10-CM | POA: Insufficient documentation

## 2015-07-29 DIAGNOSIS — E118 Type 2 diabetes mellitus with unspecified complications: Secondary | ICD-10-CM | POA: Insufficient documentation

## 2015-07-29 DIAGNOSIS — E1149 Type 2 diabetes mellitus with other diabetic neurological complication: Secondary | ICD-10-CM

## 2015-07-29 LAB — GLUCOSE, CAPILLARY
Glucose-Capillary: 109 mg/dL — ABNORMAL HIGH (ref 65–99)
Glucose-Capillary: 116 mg/dL — ABNORMAL HIGH (ref 65–99)
Glucose-Capillary: 123 mg/dL — ABNORMAL HIGH (ref 65–99)
Glucose-Capillary: 124 mg/dL — ABNORMAL HIGH (ref 65–99)
Glucose-Capillary: 136 mg/dL — ABNORMAL HIGH (ref 65–99)
Glucose-Capillary: 150 mg/dL — ABNORMAL HIGH (ref 65–99)
Glucose-Capillary: 168 mg/dL — ABNORMAL HIGH (ref 65–99)
Glucose-Capillary: 169 mg/dL — ABNORMAL HIGH (ref 65–99)
Glucose-Capillary: 185 mg/dL — ABNORMAL HIGH (ref 65–99)
Glucose-Capillary: 189 mg/dL — ABNORMAL HIGH (ref 65–99)
Glucose-Capillary: 193 mg/dL — ABNORMAL HIGH (ref 65–99)
Glucose-Capillary: 201 mg/dL — ABNORMAL HIGH (ref 65–99)
Glucose-Capillary: 228 mg/dL — ABNORMAL HIGH (ref 65–99)
Glucose-Capillary: 91 mg/dL (ref 65–99)

## 2015-07-29 LAB — BASIC METABOLIC PANEL
Anion gap: 5 (ref 5–15)
BUN: 11 mg/dL (ref 6–20)
CO2: 28 mmol/L (ref 22–32)
Calcium: 7.9 mg/dL — ABNORMAL LOW (ref 8.9–10.3)
Chloride: 106 mmol/L (ref 101–111)
Creatinine, Ser: 0.68 mg/dL (ref 0.44–1.00)
GFR calc Af Amer: >60 mL/min (ref >60)
GFR calc non Af Amer: >60 mL/min (ref >60)
Glucose, Bld: 136 mg/dL — ABNORMAL HIGH (ref 65–99)
Potassium: 2.9 mmol/L — ABNORMAL LOW (ref 3.5–5.1)
Sodium: 139 mmol/L (ref 135–145)

## 2015-07-29 LAB — CBC
HCT: 33.8 % — ABNORMAL LOW (ref 36.0–46.0)
Hemoglobin: 10.9 g/dL — ABNORMAL LOW (ref 12.0–15.0)
MCH: 26.1 pg (ref 26.0–34.0)
MCHC: 32.2 g/dL (ref 30.0–36.0)
MCV: 81.1 fL (ref 78.0–100.0)
Platelets: 144 10*3/uL — ABNORMAL LOW (ref 150–400)
RBC: 4.17 MIL/uL (ref 3.87–5.11)
RDW: 12.9 % (ref 11.5–15.5)
WBC: 7.1 10*3/uL (ref 4.0–10.5)

## 2015-07-29 LAB — HEMOGLOBIN A1C
Hgb A1c MFr Bld: 18.9 % — ABNORMAL HIGH (ref 4.8–5.6)
Mean Plasma Glucose: 496 mg/dL

## 2015-07-29 MED ORDER — LIVING WELL WITH DIABETES BOOK
Freq: Once | Status: AC
  Administered 2015-07-29: 16:00:00
  Filled 2015-07-29 (×2): qty 1

## 2015-07-29 MED ORDER — INSULIN ASPART 100 UNIT/ML ~~LOC~~ SOLN
3.0000 [IU] | Freq: Three times a day (TID) | SUBCUTANEOUS | Status: DC
  Administered 2015-07-29 – 2015-07-30 (×3): 3 [IU] via SUBCUTANEOUS

## 2015-07-29 MED ORDER — INSULIN GLARGINE 100 UNIT/ML ~~LOC~~ SOLN
10.0000 [IU] | Freq: Once | SUBCUTANEOUS | Status: AC
  Administered 2015-07-29: 10 [IU] via SUBCUTANEOUS
  Filled 2015-07-29: qty 0.1

## 2015-07-29 MED ORDER — INSULIN GLARGINE 100 UNIT/ML ~~LOC~~ SOLN
35.0000 [IU] | Freq: Every day | SUBCUTANEOUS | Status: DC
  Administered 2015-07-29: 35 [IU] via SUBCUTANEOUS
  Filled 2015-07-29 (×2): qty 0.35

## 2015-07-29 MED ORDER — INSULIN ASPART 100 UNIT/ML ~~LOC~~ SOLN
0.0000 [IU] | Freq: Three times a day (TID) | SUBCUTANEOUS | Status: DC
  Administered 2015-07-29: 3 [IU] via SUBCUTANEOUS
  Administered 2015-07-29: 1 [IU] via SUBCUTANEOUS
  Administered 2015-07-30: 2 [IU] via SUBCUTANEOUS
  Administered 2015-07-30: 1 [IU] via SUBCUTANEOUS

## 2015-07-29 MED ORDER — LOSARTAN POTASSIUM 25 MG PO TABS
25.0000 mg | ORAL_TABLET | Freq: Every day | ORAL | Status: DC
  Administered 2015-07-29 – 2015-07-30 (×2): 25 mg via ORAL
  Filled 2015-07-29 (×2): qty 1

## 2015-07-29 MED ORDER — POTASSIUM CHLORIDE CRYS ER 20 MEQ PO TBCR
40.0000 meq | EXTENDED_RELEASE_TABLET | Freq: Two times a day (BID) | ORAL | Status: DC
  Administered 2015-07-29 – 2015-07-30 (×3): 40 meq via ORAL
  Filled 2015-07-29 (×3): qty 2

## 2015-07-29 MED ORDER — PREGABALIN 100 MG PO CAPS
100.0000 mg | ORAL_CAPSULE | Freq: Every day | ORAL | Status: DC
  Administered 2015-07-29 – 2015-07-30 (×2): 100 mg via ORAL
  Filled 2015-07-29 (×2): qty 1

## 2015-07-29 NOTE — Progress Notes (Signed)
Inpatient Diabetes Program Recommendations  AACE/ADA: New Consensus Statement on Inpatient Glycemic Control (2015)  Target Ranges:  Prepandial:   less than 140 mg/dL      Peak postprandial:   less than 180 mg/dL (1-2 hours)      Critically ill patients:  140 - 180 mg/dL   Review of Glycemic Control  Diabetes history: type 2 Outpatient Diabetes medications: NPH 18 units in the am and 14 units in the pm. Current orders for Inpatient glycemic control: Lantus 35 units and Novolog meal coverage of 3 units tidwc.  Inpatient Diabetes Program Recommendations:    HgbA1C at 19%.  No A1C's under 13% since 2009. Patient has had multiple admissions for chest pain and/or hyperglycemia. Have ordered education using system videos on dm education, living well with dm book, dietician consult. Patient has had education on this on prior admissions, however I will speak with patient to re-enforce need for control. Was to see an endocrinologist following last admission. Diabetes coordinator spoke with MD in ED regarding glucose control.  Thank you Rosita Kea, RN, MSN, CDE  Diabetes Inpatient Program Office: [phone] Pager: [phone] 8:00 am to 5:00 pm

## 2015-07-29 NOTE — Progress Notes (Signed)
Echocardiogram 2D Echocardiogram has been performed.  [name] [name] 07/29/2015, 10:56 AM

## 2015-07-29 NOTE — Progress Notes (Signed)
Spoke with patient regarding diabetes.  She states that she takes Toujeo 35 units q HS and U500 18 units in the AM and 14 units in the PM.  She see's Dr. Dwyane Dee.  She had several questions related to insulin pumps and V-Go.   Encouraged her to follow-up with Dr. Dwyane Dee regarding insulin pump possibility.  Discussed options such as continuous glucose monitor.  Her husband did ask about cost.  Encouraged them to discuss with educator at Dr. Ronnie Derby office.   Thanks, Adah Perl, RN, BC-ADM Inpatient Diabetes Coordinator Pager [phone] (8a-5p)

## 2015-07-29 NOTE — Progress Notes (Signed)
[name] [name] [id number] DOB: 1965-08-13 DOA: 07/28/2015 PCP: Cathlean Cower, MD  Brief narrative: 50 y/o ? Known h/o DM ty 2 since 1998 c Neuropathy and poor control [seen by Dr. Dwyane Dee endocrin in the past] GERD MR uncotnrolled htn in the past  Admitted wtth syncopal episode and fall and pain in R side States felt nauseous day prior to admission vomited X1 did not take her insulin went to work and then passed out   Past medical history-As per Problem list Chart reviewed as below- None  Consultants:  None  Procedures:  None  Antibiotics:  None   Subjective  Alert pleasant oriented Moving around today felt a little bit dizzy Eating and drinking fairly however Tells me she checks her concerned occasionally Still taking losartan   Objective    Interim History:   Telemetry: Sinus rhythm    Objective: Filed Vitals:   07/28/15 1632 07/28/15 2038 07/29/15 0432 07/29/15 0507  BP: 128/82 111/66  143/73  Pulse: 68 71  62  Temp: 97.3 F (36.3 C) 98.5 F (36.9 C)  98.4 F (36.9 C)  TempSrc: Oral Oral  Oral  Resp: 18 20  18   Weight:   72.9 kg (160 lb 11.5 oz)   SpO2: 100% 100%  100%    Intake/Output Summary (Last 24 hours) at 07/29/15 1013 Last data filed at 07/29/15 0835  Gross per 24 hour  Intake    240 ml  Output   1675 ml  Net  -1435 ml    Exam:  General: Flat affect however pleasant, no JVD, no bruit Cardiovascular: S1-S2 no murmur rub or gallop Respiratory: Clinically clear no added sound Abdomen: Soft nontender nondistended Skin no lower extremity edema grossly intact moves all 4 equall  Data Reviewed: Basic Metabolic Panel:  Recent Labs Lab 07/28/15 0930 07/29/15 0333  NA 134* 139  K 3.5 2.9*  CL 94* 106  CO2 24 28  GLUCOSE 678* 136*  BUN 11 11  CREATININE 0.77 0.68  CALCIUM 9.1 7.9*   Liver Function Tests:  Recent Labs Lab 07/28/15 0930  AST 18  ALT 15  ALKPHOS 180*  BILITOT 0.8  PROT 6.5  ALBUMIN 3.4*   No  results for input(s): LIPASE, AMYLASE in the last 168 hours. No results for input(s): AMMONIA in the last 168 hours. CBC:  Recent Labs Lab 07/28/15 0930 07/29/15 0333  WBC 5.4 7.1  NEUTROABS 3.7  --   HGB 13.2 10.9*  HCT 39.5 33.8*  MCV 80.3 81.1  PLT 147* 144*   Cardiac Enzymes: No results for input(s): CKTOTAL, CKMB, CKMBINDEX, TROPONINI in the last 168 hours. BNP: Invalid input(s): POCBNP CBG:  Recent Labs Lab 07/29/15 0419 07/29/15 0600 07/29/15 0705 07/29/15 0813 07/29/15 0935  GLUCAP 136* 185* 193* 168* 169*    No results found for this or any previous visit (from the past 240 hour(s)).   Studies:              All Imaging reviewed and is as per above notation   Scheduled Meds: . docusate sodium  100 mg Oral BID  . enoxaparin (LOVENOX) injection  40 mg Subcutaneous Q24H  . insulin glargine  10 Units Subcutaneous Once  . insulin regular  0-10 Units Intravenous TID WC  . potassium chloride  40 mEq Oral BID  . senna  1 tablet Oral BID  . sodium chloride  3 mL Intravenous Q12H   Continuous Infusions: . sodium chloride 1,000 mL (07/28/15 1137)  .  sodium chloride    . sodium chloride    . dextrose 5 % and 0.45% NaCl    . dextrose 5 % and 0.45% NaCl 100 mL/hr at 07/29/15 0300  . insulin (NOVOLIN-R) infusion 3.3 Units/hr (07/29/15 [id number])     Assessment/Plan:  1. Syncope-probably comminution of mild volume depletion and hyper glycemia. Patient was on Lasix 60 mg prior to admission, losartan 100 daily and amlodipine 5. I have discontinued her amlodipine and Lasix and cut back losartan dose 25 daily. 2. Mild DKA-anion gap is closed. Transition to home insulin. See below discussion 3. Diabetes mellitus type 2 since 1998-confusing regimen. Patient on Lantus 35 but also Humulin N 18 units and 14 units?. We will place back only on Lantus 35 and give sliding scale supplementation. Diabetic coordinator see the patient 4. Orthostatic hypotension. Patient should be  ambulated and will continue orthostatics overnight. I expect patient will resolve from THIS  No family currently present Inpatient Telemetry   Verneita Griffes, MD  [facility] Pager [phone] 07/29/2015, 10:13 AM    LOS: 1 day

## 2015-07-29 NOTE — Progress Notes (Signed)
UR Completed. Gian Ybarra, RN, BSN.  [phone] 

## 2015-07-29 NOTE — Care Management Note (Signed)
Case Management Note  Patient Details  Name: MIKENZI SHAWVER MRN: [id number] Date of Birth: Nov 06, 1965  Subjective/Objective:    Pt admitted with syncopal episode                Action/Plan:  Pt is independent from home.  CM verified with pt that she does in fact have health and prescription insurance with [employer] and [employer] for Rx.  CM made copy of card and faxed to admitting.  Pt has active PCP listed on face sheet.  CM will continue to monitor for disposition needs   Expected Discharge Date:                  Expected Discharge Plan:  Home/Self Care  In-House Referral:     Discharge planning Services  CM Consult  Post Acute Care Choice:    Choice offered to:  Patient  DME Arranged:    DME Agency:     HH Arranged:    [place] Agency:     Status of Service:  In process, will continue to follow  Medicare Important Message Given:    Date Medicare IM Given:    Medicare IM give by:    Date Additional Medicare IM Given:    Additional Medicare Important Message give by:     If discussed at [place] of Stay Meetings, dates discussed:    Additional Comments:  Maryclare Labrador, RN 07/29/2015, 11:25 AM

## 2015-07-30 DIAGNOSIS — E1140 Type 2 diabetes mellitus with diabetic neuropathy, unspecified: Secondary | ICD-10-CM

## 2015-07-30 DIAGNOSIS — Z794 Long term (current) use of insulin: Secondary | ICD-10-CM

## 2015-07-30 DIAGNOSIS — E1165 Type 2 diabetes mellitus with hyperglycemia: Principal | ICD-10-CM

## 2015-07-30 DIAGNOSIS — E118 Type 2 diabetes mellitus with unspecified complications: Secondary | ICD-10-CM

## 2015-07-30 DIAGNOSIS — R42 Dizziness and giddiness: Secondary | ICD-10-CM

## 2015-07-30 DIAGNOSIS — I951 Orthostatic hypotension: Secondary | ICD-10-CM

## 2015-07-30 DIAGNOSIS — R55 Syncope and collapse: Secondary | ICD-10-CM

## 2015-07-30 LAB — GLUCOSE, CAPILLARY
Glucose-Capillary: 195 mg/dL — ABNORMAL HIGH (ref 65–99)
Glucose-Capillary: 145 mg/dL — ABNORMAL HIGH (ref 65–99)

## 2015-07-30 LAB — BASIC METABOLIC PANEL
Anion gap: 7 (ref 5–15)
BUN: 9 mg/dL (ref 6–20)
CO2: 25 mmol/L (ref 22–32)
Calcium: 7.7 mg/dL — ABNORMAL LOW (ref 8.9–10.3)
Chloride: 99 mmol/L — ABNORMAL LOW (ref 101–111)
Creatinine, Ser: 0.66 mg/dL (ref 0.44–1.00)
GFR calc Af Amer: >60 mL/min (ref >60)
GFR calc non Af Amer: >60 mL/min (ref >60)
Glucose, Bld: 286 mg/dL — ABNORMAL HIGH (ref 65–99)
Potassium: 3.6 mmol/L (ref 3.5–5.1)
Sodium: 131 mmol/L — ABNORMAL LOW (ref 135–145)

## 2015-07-30 MED ORDER — LOSARTAN POTASSIUM 25 MG PO TABS: 25.0000 mg | ORAL_TABLET | Freq: Every day | ORAL | Status: DC

## 2015-07-30 MED ORDER — POTASSIUM CHLORIDE ER 10 MEQ PO TBCR: 10.0000 meq | EXTENDED_RELEASE_TABLET | Freq: Every day | ORAL | Status: DC

## 2015-07-30 MED ORDER — FUROSEMIDE 20 MG PO TABS: 20.0000 mg | ORAL_TABLET | Freq: Every day | ORAL | Status: DC

## 2015-07-30 NOTE — Progress Notes (Signed)
Nutrition Education Note RD consulted for nutrition education regarding diabetes.   Lab Results  Component Value Date   HGBA1C 18.9* 07/28/2015    RD provided "Carbohydrate Counting for People with Diabetes" handout from the Academy of Nutrition and Dietetics. Discussed different food groups and their effects on blood sugar, emphasizing carbohydrate-containing foods. Provided list of carbohydrates and recommended serving sizes of common foods.  Discussed importance of controlled and consistent carbohydrate intake throughout the day. Provided examples of ways to balance meals/snacks and encouraged intake of high-fiber, whole grain complex carbohydrates. Teach back method used.  Expect fair compliance.  Body mass index is 31.39 kg/(m^2). Pt meets criteria for obese based on current BMI.  Current diet order is carb modified, patient is consuming approximately 75% of meals at this time. Labs and medications reviewed. No further nutrition interventions warranted at this time. RD contact information provided. If additional nutrition issues arise, please re-consult RD.  [name] [name]. [name], MS, RD LDN After Hours/Weekend Pager [phone]

## 2015-07-30 NOTE — Progress Notes (Signed)
Inpatient Diabetes Program Recommendations  AACE/ADA: New Consensus Statement on Inpatient Glycemic Control (2015)  Target Ranges:  Prepandial:   less than 140 mg/dL      Peak postprandial:   less than 180 mg/dL (1-2 hours)      Critically ill patients:  140 - 180 mg/dL   Spoke with Dr. Ree Kida regarding discharge plan for insulin. Patient states to diabetes coordinator that she is on toujeo and U-500 as documented, followed by Dr Dwyane Dee. MD agrees with discharge on home insulin regimen.  Thank you Rosita Kea, RN, MSN, CDE  Diabetes Inpatient Program Office: [phone] Pager: [phone] 8:00 am to 5:00 pm

## 2015-07-30 NOTE — Discharge Summary (Addendum)
Physician Discharge Summary  [name] [name] [id number] DOB: [birth year] DOA: 07/28/2015  PCP: Cathlean Cower, MD  Admit date: 07/28/2015 Discharge date: 07/30/2015  Time spent: 45 minutes  Recommendations for Outpatient Follow-up:  Patient will be discharged to home.  Patient will need to follow up with primary care provider within one week of discharge, discuss blood pressure management and repeat BMP.  Patient will also need to follow up with Dr. Dwyane Dee for diabetes management.   Patient should continue medications as prescribed.  Patient should follow a heart healthy/carb modified diet.  Patient advised not to drive or operate heavy machinery.   Discharge Diagnoses:  Syncope Uncontrolled diabetes mellitus, type II Orthostatic hypotension Hypokalemia  Discharge Condition: Stable  Diet recommendation: Heart healthy/carb modified  Filed Weights   07/29/15 0432  Weight: 72.9 kg (160 lb 11.5 oz)    History of present illness:  On 07/28/2015 by Ms. Sharene Butters [name] [name] is a 50 y.o. female with a history of DM on insulin brought to the ED due to syncopal episode. 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 Prodromal symptoms included nausea and vomiting x2. She denied any vision changes or confusion. She reported intermittent parietal headaches. No apparent seizure activity was noted.She reports being unconscious for about 15 mins. No other prodromal symptoms. She denies any shortness of breath, chest pain or palpitations,but she carries a history of irregular heartbeat. She denied any numbness or tingling.Patient did not take her diabetes or other oral meds this morning prior to admission. Labs at the ED, show blood sugars at 678, with normal anion gap not suggestive of DKA. She is being hydrated and has already received insulin, starting on glucose stabilizer. CXR is pending. EKG shows Q waves in V1 and V2 with poor R-wave progression indicates  indicating possible previous ischemia, but no evidence of acute ischemia is noted today. Troponin is normal to date. CT head is negative for acute intracranial abnormalities. Other labs are unremarkable.   Hospital Course:  Syncope -Likely secondary to volume depletion/hypotension, as patient was sick prior to episode and admitted to poor oral intake. She continued to take her blood pressure medications as well. -blood pressure medications  decreased, losartan decreased to 25 mg, discontinued Lasix and amlodipine during hospitalization.  However, would restart on smaller doses.  Patient needs close follow up with PCP regarding HTN management. -Recommend close follow-up with PCP regarding hypertension management -Echocardiogram: EF 60-65%, normal RV size and systolic function no significant valvular abnormalities -CT head: No significant abnormality identified -Advised patient not to drive or operate heavy machinery until cleared by her primary care physician and reassessed.  Uncontrolled diabetes mellitus type 2 -Anion gap closed -Spoke with diabetes coordinator, continue home regimen at discharge -Patient will need to follow-up with Dr. Dwyane Dee for diabetes management -Hemoglobin A1c 18.9  Orthostatic hypotension -Resolved, patient no longer orthostatic this morning  Hypokalemia -Resolved  Procedures: Echocardiogram  Consultations: Diabetes coordinator  Discharge Exam: Filed Vitals:   07/30/15 0845 07/30/15 0847  BP: 148/90 158/86  Pulse: 65 67  Temp:    Resp:       General: Well developed, well nourished, NAD, appears stated age  HEENT: NCAT, mucous membranes moist.  Cardiovascular: S1 S2 auscultated, no rubs, murmurs or gallops. Regular rate and rhythm.  Respiratory: Clear to auscultation bilaterally  Abdomen: Soft, nontender, nondistended, + bowel sounds  Extremities: warm dry without cyanosis clubbing or edema  Neuro: AAOx3, nonfocal  Psych:  Flat  affect  Discharge Instructions      Discharge Instructions    Discharge instructions    Complete by:  As directed   Patient will be discharged to home.  Patient will need to follow up with primary care provider within one week of discharge, discuss blood pressure management, and repeat BMP.  Patient will also need to follow up with Dr. Dwyane Dee for diabetes management.   Patient should continue medications as prescribed.  Patient should follow a heart healthy/carb modified diet.  Patient advised not to drive or operate heavy machinery.            Medication List    STOP taking these medications        Linaclotide 290 MCG Caps capsule  Commonly known as:  LINZESS      TAKE these medications        amLODipine 5 MG tablet  Commonly known as:  NORVASC  Take 1 tablet (5 mg total) by mouth daily.     atorvastatin 20 MG tablet  Commonly known as:  LIPITOR  Take 1 tablet (20 mg total) by mouth daily.     furosemide 20 MG tablet  Commonly known as:  LASIX  Take 1 tablet (20 mg total) by mouth daily.     HUMULIN R U-500 KWIKPEN 500 UNIT/ML kwikpen  Generic drug:  insulin regular human CONCENTRATED  Inject 14-18 Units into the skin 2 (two) times daily with a meal. 18 units in the morning and 14 units in the evening     Insulin Glargine 300 UNIT/ML Sopn  Commonly known as:  TOUJEO SOLOSTAR  Inject 35 Units into the skin at bedtime.     losartan 25 MG tablet  Commonly known as:  COZAAR  Take 1 tablet (25 mg total) by mouth daily.     potassium chloride 10 MEQ tablet  Commonly known as:  K-DUR  Take 1 tablet (10 mEq total) by mouth daily.     pregabalin 100 MG capsule  Commonly known as:  LYRICA  TAKE 1 CAPSULE BY MOUTH THREE TIMES DAILY AFTER MEALS     VITAMIN B-12 PO  Take 1 tablet by mouth daily.       Allergies  Allergen Reactions  . Toujeo Solostar [Insulin Glargine] Nausea And Vomiting  . Tylenol [Acetaminophen] Itching      The results of significant  diagnostics from this hospitalization (including imaging, microbiology, ancillary and laboratory) are listed below for reference.    Significant Diagnostic Studies: Dg Chest 2 View  07/28/2015  CLINICAL DATA:  Syncope, loss of consciousness. EXAM: CHEST  2 VIEW COMPARISON:  06/19/2015 FINDINGS: The heart size and mediastinal contours are within normal limits. Both lungs are clear. The visualized skeletal structures are unremarkable. IMPRESSION: No active cardiopulmonary disease. Electronically Signed   By: Rolm Baptise M.D.   On: 07/28/2015 10:15   Dg Elbow Complete Right  07/28/2015  CLINICAL DATA:  Syncope.  Right elbow pain posteriorly EXAM: RIGHT ELBOW - COMPLETE 3+ VIEW COMPARISON:  None. FINDINGS: There is no evidence of fracture, dislocation, or joint effusion. There is no evidence of arthropathy or other focal bone abnormality. Soft tissues are unremarkable. IMPRESSION: Negative. Electronically Signed   By: Rolm Baptise M.D.   On: 07/28/2015 10:15   Ct Head Wo Contrast  07/28/2015  CLINICAL DATA:  Fall, lost consciousness at work this morning. Headache. EXAM: CT HEAD WITHOUT CONTRAST TECHNIQUE: Contiguous axial images were obtained from the base of the skull  through the vertex without intravenous contrast. COMPARISON:  10/14/2013 FINDINGS: The brainstem, cerebellum, cerebral peduncles, thalami, basal ganglia, basilar cisterns, and ventricular system appear within normal limits. No intracranial hemorrhage, mass lesion, or acute CVA. IMPRESSION: 1.  No significant abnormality identified. Electronically Signed   By: Van Clines M.D.   On: 07/28/2015 10:34   Dg Finger Middle Right  07/03/2015  CLINICAL DATA:  Possible plastic foreign body in distal phalanx of right middle finger. EXAM: RIGHT MIDDLE FINGER 2+V COMPARISON:  None. FINDINGS: No radiopaque foreign body. No fracture, subluxation or dislocation. IMPRESSION: No acute findings. Electronically Signed   By: Rolm Baptise M.D.   On:  07/03/2015 09:58    Microbiology: No results found for this or any previous visit (from the past 240 hour(s)).   Labs: Basic Metabolic Panel:  Recent Labs Lab 07/28/15 0930 07/29/15 0333 07/30/15 0230  NA 134* 139 131*  K 3.5 2.9* 3.6  CL 94* 106 99*  CO2 24 28 25   GLUCOSE 678* 136* 286*  BUN 11 11 9   CREATININE 0.77 0.68 0.66  CALCIUM 9.1 7.9* 7.7*   Liver Function Tests:  Recent Labs Lab 07/28/15 0930  AST 18  ALT 15  ALKPHOS 180*  BILITOT 0.8  PROT 6.5  ALBUMIN 3.4*   No results for input(s): LIPASE, AMYLASE in the last 168 hours. No results for input(s): AMMONIA in the last 168 hours. CBC:  Recent Labs Lab 07/28/15 0930 07/29/15 0333  WBC 5.4 7.1  NEUTROABS 3.7  --   HGB 13.2 10.9*  HCT 39.5 33.8*  MCV 80.3 81.1  PLT 147* 144*   Cardiac Enzymes: No results for input(s): CKTOTAL, CKMB, CKMBINDEX, TROPONINI in the last 168 hours. BNP: BNP (last 3 results)  Recent Labs  01/01/15 1316  BNP 16.9    ProBNP (last 3 results) No results for input(s): PROBNP in the last 8760 hours.  CBG:  Recent Labs Lab 07/29/15 1045 07/29/15 1302 07/29/15 1539 07/29/15 2112 07/30/15 0625  GLUCAP 124* 123* 228* 189* 195*       Signed:  Cristal Ford  [facility] 07/30/2015, 10:48 AM

## 2015-07-30 NOTE — Progress Notes (Signed)
Pt D/C home per MD order, D/C instructions reviewed with pt, all questions answered. Pt aware of follow up appt., Pt  Instructed to pick up his prescriptions at [facility].IV removed and site looks clean and intact, Pt verbalized understanding of discharged instructions.    

## 2015-07-30 NOTE — Plan of Care (Signed)
Problem: Food- and Nutrition-Related Knowledge Deficit (NB-1.1) Goal: Nutrition education Formal process to instruct or train a patient/client in a skill or to impart knowledge to help patients/clients voluntarily manage or modify food choices and eating behavior to maintain or improve health. Outcome: Completed/Met Date Met:  07/30/15  RD consulted for nutrition education regarding diabetes.     Lab Results  Component Value Date    HGBA1C 18.9* 07/28/2015    RD provided "Carbohydrate Counting for People with Diabetes" handout from the Academy of Nutrition and Dietetics. Discussed different food groups and their effects on blood sugar, emphasizing carbohydrate-containing foods. Provided list of carbohydrates and recommended serving sizes of common foods.  Discussed importance of controlled and consistent carbohydrate intake throughout the day. Provided examples of ways to balance meals/snacks and encouraged intake of high-fiber, whole grain complex carbohydrates. Teach back method used.  Expect fair compliance.  Body mass index is 31.39 kg/(m^2). Pt meets criteria for obese based on current BMI.  Current diet order is carb modified, patient is consuming approximately 75% of meals at this time. Labs and medications reviewed. No further nutrition interventions warranted at this time. RD contact information provided. If additional nutrition issues arise, please re-consult RD.  [name] [name]. [name] Luo, MS, RD LDN After Hours/Weekend Pager [phone]

## 2015-07-30 NOTE — Discharge Instructions (Signed)
Type 2 Diabetes Mellitus, Adult °Type 2 diabetes mellitus, often simply referred to as type 2 diabetes, is a long-lasting (chronic) disease. In type 2 diabetes, the pancreas does not make enough insulin (a hormone), the cells are less responsive to the insulin that is made (insulin resistance), or both. Normally, insulin moves sugars from food into the tissue cells. The tissue cells use the sugars for energy. The lack of insulin or the lack of normal response to insulin causes excess sugars to build up in the blood instead of going into the tissue cells. As a result, high blood sugar (hyperglycemia) develops. The effect of high sugar (glucose) levels can cause many complications.  °Type 2 diabetes was also previously called adult-onset diabetes, but it can occur at any age.    °RISK FACTORS  °A person is predisposed to developing type 2 diabetes if someone in the family has the disease and also has one or more of the following primary risk factors: °· Weight gain, or being overweight or obese. °· An inactive lifestyle. °· A history of consistently eating high-calorie foods. °Maintaining a normal weight and regular physical activity can reduce the chance of developing type 2 diabetes. °SYMPTOMS  °A person with type 2 diabetes may not show symptoms initially. The symptoms of type 2 diabetes appear slowly. The symptoms include: °· Increased thirst (polydipsia). °· Increased urination (polyuria). °· Increased urination during the night (nocturia). °· Sudden or unexplained weight changes. °· Frequent, recurring infections. °· Tiredness (fatigue). °· Weakness. °· Vision changes, such as blurred vision. °· Fruity smell to your breath. °· Abdominal pain. °· Nausea or vomiting. °· Cuts or bruises which are slow to heal. °· Tingling or numbness in the hands or feet. °· An open skin wound (ulcer). °DIAGNOSIS °Type 2 diabetes is frequently not diagnosed until complications of diabetes are present. Type 2 diabetes is diagnosed  when symptoms or complications are present and when blood glucose levels are increased. Your blood glucose level may be checked by one or more of the following blood tests: °· A fasting blood glucose test. You will not be allowed to eat for at least 8 hours before a blood sample is taken. °· A random blood glucose test. Your blood glucose is checked at any time of the day regardless of when you ate. °· A hemoglobin A1c blood glucose test. A hemoglobin A1c test provides information about blood glucose control over the previous 3 months. °· An oral glucose tolerance test (OGTT). Your blood glucose is measured after you have not eaten (fasted) for 2 hours and then after you drink a glucose-containing beverage. °TREATMENT  °· You may need to take insulin or diabetes medicine daily to keep blood glucose levels in the desired range. °· If you use insulin, you may need to adjust the dosage depending on the carbohydrates that you eat with each meal or snack. °· Lifestyle changes are recommended as part of your treatment. These may include: °¨ Following an individualized diet plan developed by a nutritionist or dietitian. °¨ Exercising daily. °Your health care providers will set individualized treatment goals for you based on your age, your medicines, how long you have had diabetes, and any other medical conditions you have. Generally, the goal of treatment is to maintain the following blood glucose levels: °· Before meals (preprandial): 80-130 mg/dL. °· After meals (postprandial): below 180 mg/dL. °· A1c: less than 6.5-7%. °HOME CARE INSTRUCTIONS  °· Have your hemoglobin A1c level checked twice a year. °· Perform daily blood glucose monitoring   as directed by your health care provider. °· Monitor urine ketones when you are ill and as directed by your health care provider. °· Take your diabetes medicine or insulin as directed by your health care provider to maintain your blood glucose levels in the desired range. °¨ Never run  out of diabetes medicine or insulin. It is needed every day. °¨ If you are using insulin, you may need to adjust the amount of insulin given based on your intake of carbohydrates. Carbohydrates can raise blood glucose levels but need to be included in your diet. Carbohydrates provide vitamins, minerals, and fiber which are an essential part of a healthy diet. Carbohydrates are found in fruits, vegetables, whole grains, dairy products, legumes, and foods containing added sugars. °· Eat healthy foods. You should make an appointment to see a registered dietitian to help you create an eating plan that is right for you. °· Lose weight if you are overweight. °· Carry a medical alert card or wear your medical alert jewelry. °· Carry a 15-gram carbohydrate snack with you at all times to treat low blood glucose (hypoglycemia). Some examples of 15-gram carbohydrate snacks include: °¨ Glucose tablets, 3 or 4. °¨ Glucose gel, 15-gram tube. °¨ Raisins, 2 tablespoons (24 grams). °¨ Jelly beans, 6. °¨ Animal crackers, 8. °¨ Regular pop, 4 ounces (120 mL). °¨ Gummy treats, 9. °· Recognize hypoglycemia. Hypoglycemia occurs with blood glucose levels of 70 mg/dL and below. The risk for hypoglycemia increases when fasting or skipping meals, during or after intense exercise, and during sleep. Hypoglycemia symptoms can include: °¨ Tremors or shakes. °¨ Decreased ability to concentrate. °¨ Sweating. °¨ Increased heart rate. °¨ Headache. °¨ Dry mouth. °¨ Hunger. °¨ Irritability. °¨ Anxiety. °¨ Restless sleep. °¨ Altered speech or coordination. °¨ Confusion. °· Treat hypoglycemia promptly. If you are alert and able to safely swallow, follow the 15:15 rule: °¨ Take 15-20 grams of rapid-acting glucose or carbohydrate. Rapid-acting options include glucose gel, glucose tablets, or 4 ounces (120 mL) of fruit juice, regular soda, or low-fat milk. °¨ Check your blood glucose level 15 minutes after taking the glucose. °¨ Take 15-20 grams more of  glucose if the repeat blood glucose level is still 70 mg/dL or below. °¨ Eat a meal or snack within 1 hour once blood glucose levels return to normal. °· Be alert to feeling very thirsty and urinating more frequently than usual, which are early signs of hyperglycemia. An early awareness of hyperglycemia allows for prompt treatment. Treat hyperglycemia as directed by your health care provider. °· Engage in at least 150 minutes of moderate-intensity physical activity a week, spread over at least 3 days of the week or as directed by your health care provider. In addition, you should engage in resistance exercise at least 2 times a week or as directed by your health care provider. Try to spend no more than 90 minutes at one time inactive. °· Adjust your medicine and food intake as needed if you start a new exercise or sport. °· Follow your sick-day plan anytime you are unable to eat or drink as usual. °· Do not use any tobacco products including cigarettes, chewing tobacco, or electronic cigarettes. If you need help quitting, ask your health care provider. °· Limit alcohol intake to no more than 1 drink per day for nonpregnant women and 2 drinks per day for men. You should drink alcohol only when you are also eating food. Talk with your health care provider whether alcohol is safe   for you. Tell your health care provider if you drink alcohol several times a week.  Keep all follow-up visits as directed by your health care provider. This is important.  Schedule an eye exam soon after the diagnosis of type 2 diabetes and then annually.  Perform daily skin and foot care. Examine your skin and feet daily for cuts, bruises, redness, nail problems, bleeding, blisters, or sores. A foot exam by a health care provider should be done annually.  Brush your teeth and gums at least twice a day and floss at least once a day. Follow up with your dentist regularly.  Share your diabetes management plan with your workplace or  school.  Keep your immunizations up to date. It is recommended that you receive a flu (influenza) vaccine every year. It is also recommended that you receive a pneumonia (pneumococcal) vaccine. If you are 26 years of age or older and have never received a pneumonia vaccine, this vaccine may be given as a series of two separate shots. Ask your health care provider which additional vaccines may be recommended.  Learn to manage stress.  Obtain ongoing diabetes education and support as needed.  Participate in or seek rehabilitation as needed to maintain or improve independence and quality of life. Request a physical or occupational therapy referral if you are having foot or hand numbness, or difficulties with grooming, dressing, eating, or physical activity. SEEK MEDICAL CARE IF:   You are unable to eat food or drink fluids for more than 6 hours.  You have nausea and vomiting for more than 6 hours.  Your blood glucose level is over 240 mg/dL.  There is a change in mental status.  You develop an additional serious illness.  You have diarrhea for more than 6 hours.  You have been sick or have had a fever for a couple of days and are not getting better.  You have pain during any physical activity.  SEEK IMMEDIATE MEDICAL CARE IF:  You have difficulty breathing.  You have moderate to large ketone levels.   This information is not intended to replace advice given to you by your health care provider. Make sure you discuss any questions you have with your health care provider.   Document Released: 06/21/2005 Document Revised: 03/12/2015 Document Reviewed: 01/18/2012 Elsevier Interactive Patient Education 2016 [employer]. Syncope Syncope is a medical term for fainting or passing out. This means you lose consciousness and drop to the ground. People are generally unconscious for less than 5 minutes. You may have some muscle twitches for up to 15 seconds before waking up and returning to  normal. Syncope occurs more often in older adults, but it can happen to anyone. While most causes of syncope are not dangerous, syncope can be a sign of a serious medical problem. It is important to seek medical care.  CAUSES  Syncope is caused by a sudden drop in blood flow to the brain. The specific cause is often not determined. Factors that can bring on syncope include:  Taking medicines that lower blood pressure.  Sudden changes in posture, such as standing up quickly.  Taking more medicine than prescribed.  Standing in one place for too long.  Seizure disorders.  Dehydration and excessive exposure to heat.  Low blood sugar (hypoglycemia).  Straining to have a bowel movement.  Heart disease, irregular heartbeat, or other circulatory problems.  Fear, emotional distress, seeing blood, or severe pain. SYMPTOMS  Right before fainting, you may:  Feel dizzy or  light-headed.  Feel nauseous.  See all white or all black in your field of vision.  Have cold, clammy skin. DIAGNOSIS  Your health care provider will ask about your symptoms, perform a physical exam, and perform an electrocardiogram (ECG) to record the electrical activity of your heart. Your health care provider may also perform other heart or blood tests to determine the cause of your syncope which may include:  Transthoracic echocardiogram (TTE). During echocardiography, sound waves are used to evaluate how blood flows through your heart.  Transesophageal echocardiogram (TEE).  Cardiac monitoring. This allows your health care provider to monitor your heart rate and rhythm in real time.  Holter monitor. This is a portable device that records your heartbeat and can help diagnose heart arrhythmias. It allows your health care provider to track your heart activity for several days, if needed.  Stress tests by exercise or by giving medicine that makes the heart beat faster. TREATMENT  In most cases, no treatment is  needed. Depending on the cause of your syncope, your health care provider may recommend changing or stopping some of your medicines. HOME CARE INSTRUCTIONS  Have someone stay with you until you feel stable.  Do not drive, use machinery, or play sports until your health care provider says it is okay.  Keep all follow-up appointments as directed by your health care provider.  Lie down right away if you start feeling like you might faint. Breathe deeply and steadily. Wait until all the symptoms have passed.  Drink enough fluids to keep your urine clear or pale yellow.  If you are taking blood pressure or heart medicine, get up slowly and take several minutes to sit and then stand. This can reduce dizziness. SEEK IMMEDIATE MEDICAL CARE IF:   You have a severe headache.  You have unusual pain in the chest, abdomen, or back.  You are bleeding from your mouth or rectum, or you have black or tarry stool.  You have an irregular or very fast heartbeat.  You have pain with breathing.  You have repeated fainting or seizure-like jerking during an episode.  You faint when sitting or lying down.  You have confusion.  You have trouble walking.  You have severe weakness.  You have vision problems. If you fainted, call your local emergency services (911 in [place] Do not drive yourself to the hospital.    This information is not intended to replace advice given to you by your health care provider. Make sure you discuss any questions you have with your health care provider.   Document Released: 06/21/2005 Document Revised: 11/05/2014 Document Reviewed: 08/20/2011 Elsevier Interactive Patient Education [employer].

## 2015-08-13 ENCOUNTER — Encounter (HOSPITAL_COMMUNITY): Admission: RE | Payer: Self-pay | Source: Ambulatory Visit

## 2015-08-13 ENCOUNTER — Ambulatory Visit (HOSPITAL_COMMUNITY)
Admission: RE | Admit: 2015-08-13 | Payer: PRIVATE HEALTH INSURANCE | Source: Ambulatory Visit | Admitting: Ophthalmology

## 2015-08-13 SURGERY — PHACOEMULSIFICATION, CATARACT, WITH IOL INSERTION
Anesthesia: Monitor Anesthesia Care | Site: Eye | Laterality: Left

## 2015-08-28 ENCOUNTER — Other Ambulatory Visit: Payer: Self-pay | Admitting: Internal Medicine

## 2015-09-09 ENCOUNTER — Ambulatory Visit: Payer: Self-pay | Admitting: *Deleted

## 2015-10-15 ENCOUNTER — Other Ambulatory Visit: Payer: Self-pay | Admitting: Internal Medicine

## 2015-11-19 ENCOUNTER — Ambulatory Visit (INDEPENDENT_AMBULATORY_CARE_PROVIDER_SITE_OTHER): Payer: 59 | Admitting: Internal Medicine

## 2015-11-19 ENCOUNTER — Telehealth: Payer: Self-pay | Admitting: Internal Medicine

## 2015-11-19 ENCOUNTER — Encounter: Payer: Self-pay | Admitting: Internal Medicine

## 2015-11-19 ENCOUNTER — Other Ambulatory Visit (INDEPENDENT_AMBULATORY_CARE_PROVIDER_SITE_OTHER): Payer: 59

## 2015-11-19 ENCOUNTER — Telehealth: Payer: Self-pay

## 2015-11-19 VITALS — BP 140/80 | HR 82 | Resp 20 | Wt 163.0 lb

## 2015-11-19 DIAGNOSIS — Z0001 Encounter for general adult medical examination with abnormal findings: Secondary | ICD-10-CM | POA: Diagnosis not present

## 2015-11-19 DIAGNOSIS — E1149 Type 2 diabetes mellitus with other diabetic neurological complication: Secondary | ICD-10-CM | POA: Diagnosis not present

## 2015-11-19 DIAGNOSIS — R6889 Other general symptoms and signs: Secondary | ICD-10-CM

## 2015-11-19 DIAGNOSIS — I10 Essential (primary) hypertension: Secondary | ICD-10-CM | POA: Diagnosis not present

## 2015-11-19 DIAGNOSIS — E1140 Type 2 diabetes mellitus with diabetic neuropathy, unspecified: Secondary | ICD-10-CM | POA: Diagnosis not present

## 2015-11-19 DIAGNOSIS — E1165 Type 2 diabetes mellitus with hyperglycemia: Secondary | ICD-10-CM

## 2015-11-19 DIAGNOSIS — R7989 Other specified abnormal findings of blood chemistry: Secondary | ICD-10-CM

## 2015-11-19 DIAGNOSIS — IMO0002 Reserved for concepts with insufficient information to code with codable children: Secondary | ICD-10-CM

## 2015-11-19 DIAGNOSIS — H543 Unqualified visual loss, both eyes: Secondary | ICD-10-CM

## 2015-11-19 DIAGNOSIS — Z794 Long term (current) use of insulin: Secondary | ICD-10-CM

## 2015-11-19 HISTORY — DX: Unqualified visual loss, both eyes: H54.3

## 2015-11-19 LAB — BASIC METABOLIC PANEL
BUN: 16 mg/dL (ref 6–23)
CO2: 30 mEq/L (ref 19–32)
Calcium: 8.4 mg/dL (ref 8.4–10.5)
Chloride: 99 mEq/L (ref 96–112)
Creatinine, Ser: 0.77 mg/dL (ref 0.40–1.20)
GFR: 102.33 mL/min (ref >60.00)
Glucose, Bld: 500 mg/dL (ref 70–99)
Potassium: 4 mEq/L (ref 3.5–5.1)
Sodium: 134 mEq/L — ABNORMAL LOW (ref 135–145)

## 2015-11-19 LAB — CBC WITH DIFFERENTIAL/PLATELET
Basophils Absolute: 0 10*3/uL (ref 0.0–0.1)
Basophils Relative: 0.5 % (ref 0.0–3.0)
Eosinophils Absolute: 0.1 10*3/uL (ref 0.0–0.7)
Eosinophils Relative: 1.2 % (ref 0.0–5.0)
HCT: 39.1 % (ref 36.0–46.0)
Hemoglobin: 13 g/dL (ref 12.0–15.0)
Lymphocytes Relative: 31.7 % (ref 12.0–46.0)
Lymphs Abs: 2.2 10*3/uL (ref 0.7–4.0)
MCHC: 33.3 g/dL (ref 30.0–36.0)
MCV: 81.4 fl (ref 78.0–100.0)
Monocytes Absolute: 0.3 10*3/uL (ref 0.1–1.0)
Monocytes Relative: 4.7 % (ref 3.0–12.0)
Neutro Abs: 4.3 10*3/uL (ref 1.4–7.7)
Neutrophils Relative %: 61.9 % (ref 43.0–77.0)
Platelets: 168 10*3/uL (ref 150.0–400.0)
RBC: 4.8 Mil/uL (ref 3.87–5.11)
RDW: 13.9 % (ref 11.5–15.5)
WBC: 7 10*3/uL (ref 4.0–10.5)

## 2015-11-19 LAB — HEPATIC FUNCTION PANEL
ALT: 16 U/L (ref 0–35)
AST: 11 U/L (ref 0–37)
Albumin: 3.2 g/dL — ABNORMAL LOW (ref 3.5–5.2)
Alkaline Phosphatase: 123 U/L — ABNORMAL HIGH (ref 39–117)
Bilirubin, Direct: 0 mg/dL (ref 0.0–0.3)
Total Bilirubin: 0.3 mg/dL (ref 0.2–1.2)
Total Protein: 6.2 g/dL (ref 6.0–8.3)

## 2015-11-19 LAB — LIPID PANEL
Cholesterol: 281 mg/dL — ABNORMAL HIGH (ref 0–200)
HDL: 60 mg/dL (ref >39.00)
NonHDL: 220.69
Total CHOL/HDL Ratio: 5
Triglycerides: 235 mg/dL — ABNORMAL HIGH (ref 0.0–149.0)
VLDL: 47 mg/dL — ABNORMAL HIGH (ref 0.0–40.0)

## 2015-11-19 LAB — URINALYSIS, ROUTINE W REFLEX MICROSCOPIC
Bilirubin Urine: NEGATIVE
Ketones, ur: NEGATIVE
Leukocytes, UA: NEGATIVE
Nitrite: NEGATIVE
Specific Gravity, Urine: <=1.005 — AB (ref 1.000–1.030)
Total Protein, Urine: 100 — AB
Urine Glucose: >=1000 — AB
Urobilinogen, UA: 0.2 (ref 0.0–1.0)
pH: 5.5 (ref 5.0–8.0)

## 2015-11-19 LAB — HEMOGLOBIN A1C: Hgb A1c MFr Bld: 16.3 % — ABNORMAL HIGH (ref 4.6–6.5)

## 2015-11-19 LAB — LDL CHOLESTEROL, DIRECT: Direct LDL: 173 mg/dL

## 2015-11-19 LAB — MICROALBUMIN / CREATININE URINE RATIO
Creatinine,U: 36.2 mg/dL
Microalb Creat Ratio: 142.5 mg/g — ABNORMAL HIGH (ref 0.0–30.0)
Microalb, Ur: 51.5 mg/dL — ABNORMAL HIGH (ref 0.0–1.9)

## 2015-11-19 LAB — TSH: TSH: 0.91 u[IU]/mL (ref 0.35–4.50)

## 2015-11-19 MED ORDER — PREGABALIN 150 MG PO CAPS: 150.0000 mg | ORAL_CAPSULE | Freq: Two times a day (BID) | ORAL | Status: DC

## 2015-11-19 NOTE — Telephone Encounter (Signed)
Artis Delay from the [employer].   bmet glucose - 500. Labs were repeated.   Gave Corrine the verbal critical.

## 2015-11-19 NOTE — Progress Notes (Signed)
Pre visit review using our clinic review tool, if applicable. No additional management support is needed unless otherwise documented below in the visit note. 

## 2015-11-19 NOTE — Telephone Encounter (Signed)
Patient aware of lab results.

## 2015-11-19 NOTE — Telephone Encounter (Signed)
Please advise would this be ok?

## 2015-11-19 NOTE — Telephone Encounter (Signed)
Please ask pt to take extra 25 units toujeo now, cont to check cbg's and call if remains over 500

## 2015-11-19 NOTE — Telephone Encounter (Signed)
Patient is requesting handicap parking placard form to be completed for her.  Please follow up in regards.

## 2015-11-19 NOTE — Progress Notes (Signed)
Subjective:    Patient ID: [name] [name], female    DOB: [birth year], 50 y.o.   MRN: [id number]  HPI  Here for wellness and f/u;  Overall doing ok;  Pt denies Chest pain, worsening SOB, DOE, wheezing, orthopnea, PND, worsening LE edema, palpitations, dizziness or syncope.  Pt denies neurological change such as new headache, facial or extremity weakness, but does have suboptimal neuropathic pain control, asks for increased lyrica..  Pt denies polydipsia, polyuria, or low sugar symptoms. Pt states overall good compliance with treatment and medications, good tolerability, and has been trying to follow appropriate diet.  Pt denies worsening depressive symptoms, suicidal ideation or panic. No fever, night sweats, wt loss, loss of appetite, or other constitutional symptoms.  Pt states good ability with ADL's, has low fall risk, home safety reviewed and adequate, no other significant changes in hearing, and only occasionally active with exercise. Just started with husband insurance last wk,   Recently approved for SS Disability for vision and DM,. Plans to see optho in f/u as stil having blurry vision after cataracts. Needs new endo referral, and podiatry with hx of neuropathy.  Denies worsening reflux, abd pain, dysphagia, n/v, bowel change or blood.  Denies urinary symptoms such as dysuria, frequency, urgency, flank pain, hematuria or n/v, fever, chills. Past Medical History  Diagnosis Date  . Diabetes mellitus   . Hypertension   . Heart murmur   . Mitral regurgitation   . Family history of adverse reaction to anesthesia     " my uncle's heart stoped "  . Syncope and collapse 07/28/2015  . GERD (gastroesophageal reflux disease)   . Neuropathy in diabetes ([place])   . 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 Abdominal hysterectomy    . Tubal ligation    . Tonsillectomy    . Cesarean section    . Ganglion cyst excision    . Eye surgery Left retinal    . Cholecystectomy      reports that she has never smoked. She has never used smokeless tobacco. She reports that she does not drink alcohol or use illicit drugs. family history includes Diabetes in her father; Heart attack in her father. Allergies  Allergen Reactions  . Toujeo Solostar [Insulin Glargine] Nausea And Vomiting  . Tylenol [Acetaminophen] Itching   Current Outpatient Prescriptions on File Prior to Visit  Medication Sig Dispense Refill  . amLODipine (NORVASC) 5 MG tablet Take 1 tablet (5 mg total) by mouth daily. 30 tablet 5  . Cyanocobalamin (VITAMIN B-12 PO) Take 1 tablet by mouth daily.    . furosemide (LASIX) 20 MG tablet Take 1 tablet (20 mg total) by mouth daily. 30 tablet 0  . insulin regular human CONCENTRATED (HUMULIN R U-500 KWIKPEN) 500 UNIT/ML kwikpen Inject 14-18 Units into the skin 2 (two) times daily with a meal. 18 units in the morning and 14 units in the evening    . losartan (COZAAR) 100 MG tablet TAKE 1 TABLET(100 MG) BY MOUTH DAILY 90 tablet 0  . losartan (COZAAR) 25 MG tablet Take 1 tablet (25 mg total) by mouth daily. 30 tablet 0  . pantoprazole (PROTONIX) 40 MG tablet TAKE 1 TABLET BY MOUTH EVERY DAY 90 tablet 0  . potassium chloride (K-DUR) 10 MEQ tablet Take 1 tablet (10 mEq total) by mouth daily. 30 tablet 0   No current facility-administered medications on file prior to visit.   Review of Systems  Constitutional: Negative for increased diaphoresis, or other activity, appetite or siginficant weight change other than noted HENT: Negative for worsening hearing loss, ear pain, facial swelling, mouth sores and neck stiffness.   Eyes: Negative for other worsening pain, redness or visual disturbance.  Respiratory: Negative for choking or stridor Cardiovascular: Negative for other chest pain and palpitations.  Gastrointestinal: Negative for worsening diarrhea, blood in stool, or abdominal distention Genitourinary: Negative for hematuria, flank pain or  change in urine volume.  Musculoskeletal: Negative for myalgias or other joint complaints.  Skin: Negative for other color change and wound or drainage.  Neurological: Negative for syncope and numbness. other than noted Hematological: Negative for adenopathy. or other swelling Psychiatric/Behavioral: Negative for hallucinations, SI, self-injury, decreased concentration or other worsening agitation.      Objective:   Physical Exam BP 140/80 mmHg  Pulse 82  Resp 20  Wt 163 lb (73.936 kg)  SpO2 92% BP 140/80 mmHg  Pulse 82  Resp 20  Wt 163 lb (73.936 kg)  SpO2 92% VS noted,  Constitutional: Pt is oriented to person, place, and time. Appears well-developed and well-nourished, in no significant distress Head: Normocephalic and atraumatic  Eyes: Conjunctivae and EOM are normal. Pupils are equal, round, and reactive to light Right Ear: External ear normal.  Left Ear: External ear normal Nose: Nose normal.  Mouth/Throat: Oropharynx is clear and moist  Neck: Normal range of motion. Neck supple. No JVD present. No tracheal deviation present or significant neck LA or mass Cardiovascular: Normal rate, regular rhythm, normal heart sounds and intact distal pulses.   Pulmonary/Chest: Effort normal and breath sounds without rales or wheezing  Abdominal: Soft. Bowel sounds are normal. NT. No HSM  Musculoskeletal: Normal range of motion. Exhibits no edema Lymphadenopathy: Has no cervical adenopathy.  Neurological: Pt is alert and oriented to person, place, and time. Pt has normal reflexes. No cranial nerve deficit. Motor grossly intact, has decreased sens to LT to LE's Skin: Skin is warm and dry. No rash noted or new ulcers Psychiatric:  Has normal mood and affect. Behavior is normal.     Assessment & Plan:

## 2015-11-19 NOTE — Patient Instructions (Addendum)
OK to increase the lyrica to 150 mg three times per day  Please call if any side effect, to consider change to cymbalta  Please continue all other medications as before, and refills have been done if requested.  Please have the pharmacy call with any other refills you may need.  Please continue your efforts at being more active, low cholesterol diet, and weight control.  You are otherwise up to date with prevention measures today.  Please keep your appointments with your specialists as you may have planned  You will be contacted regarding the referral for: Dr Dwyane Dee, and podiatry  Please go to the LAB in the Basement (turn left off the elevator) for the tests to be done today  You will be contacted by phone if any changes need to be made immediately.  Otherwise, you will receive a letter about your results with an explanation, but please check with MyChart first.  Please remember to sign up for MyChart if you have not done so, as this will be important to you in the future with finding out test results, communicating by private email, and scheduling acute appointments online when needed.  Please return in 6 months, or sooner if needed

## 2015-11-20 ENCOUNTER — Telehealth: Payer: Self-pay

## 2015-11-20 NOTE — Telephone Encounter (Signed)
PA initiated via CoverMyMeds key [id number]

## 2015-11-20 NOTE — Telephone Encounter (Signed)
Done hardcopy to Corinne  

## 2015-11-20 NOTE — Telephone Encounter (Signed)
Patient aware, placed up front for pick up

## 2015-11-20 NOTE — Telephone Encounter (Signed)
PA APPROVED - fax to follow with approval dates

## 2015-11-21 ENCOUNTER — Other Ambulatory Visit: Payer: Self-pay | Admitting: Internal Medicine

## 2015-11-21 MED ORDER — INSULIN GLARGINE 300 UNIT/ML ~~LOC~~ SOPN: 50.0000 [IU] | PEN_INJECTOR | Freq: Every day | SUBCUTANEOUS | Status: DC

## 2015-11-21 MED ORDER — ATORVASTATIN CALCIUM 20 MG PO TABS: 20.0000 mg | ORAL_TABLET | Freq: Every day | ORAL | Status: DC

## 2015-11-23 NOTE — Assessment & Plan Note (Signed)
Painful uncontrolled, for increased lyrica to 150 bid,  to f/u any worsening symptoms or concerns

## 2015-11-23 NOTE — Assessment & Plan Note (Signed)
stable overall by history and exam, recent data reviewed with pt, and pt to continue medical treatment as before,  to f/u any worsening symptoms or concerns BP Readings from Last 3 Encounters:  11/19/15 140/80  07/30/15 158/86  06/19/15 137/86

## 2015-11-23 NOTE — Assessment & Plan Note (Addendum)
?   Control, by hx stable overall by history and exam, recent data reviewed with pt, and pt to continue medical treatment as before,  to f/u any worsening symptoms or concerns, for endo referral and podiatry  In addition to the time spent performing CPE, I spent an additional 40 minutes face to face,in which greater than 50% of this time was spent in counseling and coordination of care for patient's acute illness as documented.

## 2015-11-23 NOTE — Assessment & Plan Note (Signed)
Also for optho f/u - pt states will make her own appt

## 2015-11-23 NOTE — Assessment & Plan Note (Signed)

## 2015-11-24 ENCOUNTER — Encounter (HOSPITAL_COMMUNITY): Payer: Self-pay | Admitting: Emergency Medicine

## 2015-11-24 ENCOUNTER — Emergency Department (HOSPITAL_COMMUNITY)
Admission: EM | Admit: 2015-11-24 | Discharge: 2015-11-24 | Disposition: A | Discharge status: Home / Self Care | Payer: 59 | Attending: Emergency Medicine | Admitting: Emergency Medicine

## 2015-11-24 ENCOUNTER — Emergency Department (HOSPITAL_COMMUNITY): Payer: 59

## 2015-11-24 DIAGNOSIS — Z794 Long term (current) use of insulin: Secondary | ICD-10-CM | POA: Diagnosis not present

## 2015-11-24 DIAGNOSIS — Z8669 Personal history of other diseases of the nervous system and sense organs: Secondary | ICD-10-CM | POA: Insufficient documentation

## 2015-11-24 DIAGNOSIS — Z79899 Other long term (current) drug therapy: Secondary | ICD-10-CM | POA: Insufficient documentation

## 2015-11-24 DIAGNOSIS — I10 Essential (primary) hypertension: Secondary | ICD-10-CM | POA: Insufficient documentation

## 2015-11-24 DIAGNOSIS — K219 Gastro-esophageal reflux disease without esophagitis: Secondary | ICD-10-CM | POA: Insufficient documentation

## 2015-11-24 DIAGNOSIS — R011 Cardiac murmur, unspecified: Secondary | ICD-10-CM | POA: Diagnosis not present

## 2015-11-24 DIAGNOSIS — E1140 Type 2 diabetes mellitus with diabetic neuropathy, unspecified: Secondary | ICD-10-CM | POA: Insufficient documentation

## 2015-11-24 MED ORDER — TRAMADOL HCL 50 MG PO TABS: ORAL_TABLET | ORAL | Status: DC

## 2015-11-24 MED ORDER — TRAMADOL HCL 50 MG PO TABS
50.0000 mg | ORAL_TABLET | Freq: Once | ORAL | Status: AC
Start: 2015-11-24 — End: 2015-11-24
  Administered 2015-11-24: 50 mg via ORAL
  Filled 2015-11-24: qty 1

## 2015-11-24 NOTE — ED Notes (Signed)
Pt ambulated to room from waiting room. 

## 2015-11-24 NOTE — Discharge Instructions (Signed)
Follow-up with your doctor in a couple weeks for recheck

## 2015-11-24 NOTE — ED Provider Notes (Signed)
CSN: [id number]     Arrival date & time 11/24/15  1048 History   First MD Initiated Contact with Patient 11/24/15 1310     Chief Complaint  Patient presents with  . Hypertension     (Consider location/radiation/quality/duration/timing/severity/associated sxs/prior Treatment) Patient is a 50 y.o. female presenting with hypertension. The history is provided by the patient (Patient complains of headache discomfort and hypertension. She was seen by her doctor on Friday..  At that time they did not change her blood pressure medicine).  Hypertension This is a new problem. The current episode started 1 to 2 hours ago. The problem occurs constantly. The problem has been gradually improving. Associated symptoms include headaches. Pertinent negatives include no chest pain and no abdominal pain. Nothing aggravates the symptoms.    Past Medical History  Diagnosis Date  . Diabetes mellitus   . Hypertension   . Heart murmur   . Mitral regurgitation   . Family history of adverse reaction to anesthesia     " my uncle's heart stoped "  . Syncope and collapse 07/28/2015  . GERD (gastroesophageal reflux disease)   . Neuropathy in diabetes ([place])   . 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 Abdominal hysterectomy    . Tubal ligation    . Tonsillectomy    . Cesarean section    . Ganglion cyst excision    . Eye surgery Left retinal  . Cholecystectomy     Family History  Problem Relation Age of Onset  . Diabetes Father   . Heart attack Father    Social History  Substance Use Topics  . Smoking status: Never Smoker   . Smokeless tobacco: Never Used  . Alcohol Use: No   OB History    No data available     Review of Systems  Constitutional: Negative for appetite change and fatigue.  HENT: Negative for congestion, ear discharge and sinus pressure.   Eyes: Negative for discharge.  Respiratory: Negative for cough.   Cardiovascular:  Negative for chest pain.  Gastrointestinal: Negative for abdominal pain and diarrhea.  Genitourinary: Negative for frequency and hematuria.  Musculoskeletal: Negative for back pain.  Skin: Negative for rash.  Neurological: Positive for headaches. Negative for seizures.  Psychiatric/Behavioral: Negative for hallucinations.      Allergies  Toujeo solostar and Tylenol  Home Medications   Prior to Admission medications   Medication Sig Start Date End Date Taking? Authorizing Provider  amLODipine (NORVASC) 5 MG tablet Take 1 tablet (5 mg total) by mouth daily. 05/03/15   Eulas Post, MD  atorvastatin (LIPITOR) 20 MG tablet Take 1 tablet (20 mg total) by mouth daily. 11/21/15   Biagio Borg, MD  Cyanocobalamin (VITAMIN B-12 PO) Take 1 tablet by mouth daily.    Historical Provider, MD  furosemide (LASIX) 20 MG tablet Take 1 tablet (20 mg total) by mouth daily. 07/30/15   Maryann Mikhail, DO  Insulin Glargine (TOUJEO SOLOSTAR) 300 UNIT/ML SOPN Inject 50 Units into the skin at bedtime. 11/21/15   Biagio Borg, MD  insulin regular human CONCENTRATED (HUMULIN R U-500 KWIKPEN) 500 UNIT/ML kwikpen Inject 14-18 Units into the skin 2 (two) times daily with a meal. 18 units in the morning and 14 units in the evening    Historical Provider, MD  losartan (COZAAR) 100 MG tablet TAKE 1 TABLET(100 MG) BY MOUTH DAILY 10/15/15   Biagio Borg, MD  losartan (COZAAR) 25 MG  tablet Take 1 tablet (25 mg total) by mouth daily. 07/30/15   Maryann Mikhail, DO  pantoprazole (PROTONIX) 40 MG tablet TAKE 1 TABLET BY MOUTH EVERY DAY 08/29/15   Biagio Borg, MD  potassium chloride (K-DUR) 10 MEQ tablet Take 1 tablet (10 mEq total) by mouth daily. 07/30/15   Maryann Mikhail, DO  pregabalin (LYRICA) 150 MG capsule Take 1 capsule (150 mg total) by mouth 2 (two) times daily. 11/19/15   Biagio Borg, MD  traMADol Veatrice Bourbon) 50 MG tablet Take one every 6 hours as needed for pain 11/24/15   Milton Ferguson, MD   BP 177/97 mmHg  Pulse  65  Temp(Src) 98.2 F (36.8 C) (Oral)  Resp 15  SpO2 100% Physical Exam  Constitutional: She is oriented to person, place, and time. She appears well-developed.  HENT:  Head: Normocephalic.  Eyes: Conjunctivae and EOM are normal. No scleral icterus.  Neck: Neck supple. No thyromegaly present.  Cardiovascular: Normal rate and regular rhythm.  Exam reveals no gallop and no friction rub.   No murmur heard. Pulmonary/Chest: No stridor. She has no wheezes. She has no rales. She exhibits no tenderness.  Abdominal: She exhibits no distension. There is no tenderness. There is no rebound.  Musculoskeletal: Normal range of motion. She exhibits no edema.  Lymphadenopathy:    She has no cervical adenopathy.  Neurological: She is oriented to person, place, and time. She exhibits normal muscle tone. Coordination normal.  Skin: No rash noted. No erythema.  Psychiatric: She has a normal mood and affect. Her behavior is normal.    ED Course  Procedures (including critical care time) Labs Review Labs Reviewed - No data to display  Imaging Review Ct Head Wo Contrast  11/24/2015  CLINICAL DATA:  Sharp head pain and headache last night. Elevated blood pressure today. EXAM: CT HEAD WITHOUT CONTRAST TECHNIQUE: Contiguous axial images were obtained from the base of the skull through the vertex without intravenous contrast. COMPARISON:  Head CT dated 07/28/2015. FINDINGS: Brain: Ventricles are normal in size and configuration. All areas of the brain demonstrate normal gray-white matter attenuation. There is no mass, hemorrhage, edema or other evidence of acute parenchymal abnormality. No extra-axial hemorrhage. Vascular: No hyperdense vessel or unexpected calcification. There are chronic calcified atherosclerotic changes of the large vessels at the skull base. Skull: Negative for fracture or focal lesion. Sinuses/Orbits: No acute findings. Visualized upper paranasal sinuses are clear. Mastoid air cells are  clear. Other: Incidental note of cerumen in the left external auditory canal. IMPRESSION: 1. Negative head CT.  No intracranial mass, hemorrhage or edema. 2. Chronic calcified atherosclerotic changes of the paraclinoid internal carotid arteries, left greater than right, atypical for age. Electronically Signed   By: Franki Cabot M.D.   On: 11/24/2015 14:06   I have personally reviewed and evaluated these images and lab results as part of my medical decision-making.   EKG Interpretation None      MDM   Final diagnoses:  Essential hypertension    Patient's headache improved with pain medicine. Her blood pressure is still elevated. She will follow-up with her primary care doctor    Milton Ferguson, MD 11/24/15 1458

## 2015-11-24 NOTE — ED Notes (Signed)
Pt reports that she checked her bp this morning and it was elevated. Pt also reports sharp head pain and headache last night. Pt alert x4. NAD at this time.

## 2015-11-28 ENCOUNTER — Ambulatory Visit: Payer: Self-pay | Admitting: Endocrinology

## 2015-11-28 ENCOUNTER — Telehealth: Payer: Self-pay | Admitting: Endocrinology

## 2015-11-28 ENCOUNTER — Encounter: Payer: Self-pay | Admitting: *Deleted

## 2015-11-28 NOTE — Telephone Encounter (Signed)
Patient no showed today's appt. Please advise on how to follow up. °A. No follow up necessary. °B. Follow up urgent. Contact patient immediately. °C. Follow up necessary. Contact patient and schedule visit in ___ days. °D. Follow up advised. Contact patient and schedule visit in ____weeks. ° °

## 2015-11-28 NOTE — Telephone Encounter (Signed)
Letter mailed, she seems to be seeing Dr. Jenny Reichmann for DM

## 2015-12-03 ENCOUNTER — Ambulatory Visit: Payer: Self-pay | Admitting: Endocrinology

## 2015-12-04 ENCOUNTER — Ambulatory Visit: Payer: Self-pay | Admitting: Endocrinology

## 2015-12-04 ENCOUNTER — Emergency Department (HOSPITAL_COMMUNITY)
Admission: EM | Admit: 2015-12-04 | Discharge: 2015-12-04 | Disposition: A | Discharge status: Home / Self Care | Payer: 59 | Attending: Emergency Medicine | Admitting: Emergency Medicine

## 2015-12-04 ENCOUNTER — Encounter (HOSPITAL_COMMUNITY): Payer: Self-pay | Admitting: Emergency Medicine

## 2015-12-04 DIAGNOSIS — H6122 Impacted cerumen, left ear: Secondary | ICD-10-CM | POA: Diagnosis not present

## 2015-12-04 DIAGNOSIS — E1140 Type 2 diabetes mellitus with diabetic neuropathy, unspecified: Secondary | ICD-10-CM | POA: Diagnosis not present

## 2015-12-04 DIAGNOSIS — I10 Essential (primary) hypertension: Secondary | ICD-10-CM | POA: Insufficient documentation

## 2015-12-04 DIAGNOSIS — Z79899 Other long term (current) drug therapy: Secondary | ICD-10-CM | POA: Diagnosis not present

## 2015-12-04 DIAGNOSIS — H9202 Otalgia, left ear: Secondary | ICD-10-CM | POA: Diagnosis present

## 2015-12-04 DIAGNOSIS — H9312 Tinnitus, left ear: Secondary | ICD-10-CM | POA: Diagnosis not present

## 2015-12-04 DIAGNOSIS — Z794 Long term (current) use of insulin: Secondary | ICD-10-CM | POA: Insufficient documentation

## 2015-12-04 MED ORDER — CIPROFLOXACIN-DEXAMETHASONE 0.3-0.1 % OT SUSP: 4.0000 [drp] | Freq: Two times a day (BID) | OTIC | Status: AC

## 2015-12-04 NOTE — ED Notes (Signed)
See PA note for secondary assessment.   

## 2015-12-04 NOTE — Discharge Instructions (Signed)
Take ciprodex (4 drops into left ear twice daily for the next 5 days) as treatment for otitis externa, or outer ear infection.  Please avoid using ibuprofen as it may cause long term harm to your hearing.  Do not clean your ear with Q-tip.  Use hydrogen peroxide mix with water (1:1 ratio) once to twice a week to help decrease ear wax build up.  Follow up with your doctor for further care.  Cerumen Impaction The structures of the external ear canal secrete a waxy substance known as cerumen. Excess cerumen can build up in the ear canal, causing a condition known as cerumen impaction. Cerumen impaction can cause ear pain and disrupt the function of the ear. The rate of cerumen production differs for each individual. In certain individuals, the configuration of the ear canal may decrease his or her ability to naturally remove cerumen. CAUSES Cerumen impaction is caused by excessive cerumen production or buildup. RISK FACTORS  Frequent use of swabs to clean ears.  Having narrow ear canals.  Having eczema.  Being dehydrated. SIGNS AND SYMPTOMS  Diminished hearing.  Ear drainage.  Ear pain.  Ear itch. TREATMENT Treatment may involve:  Over-the-counter or prescription ear drops to soften the cerumen.  Removal of cerumen by a health care provider. This may be done with:  Irrigation with warm water. This is the most common method of removal.  Ear curettes and other instruments.  Surgery. This may be done in severe cases. HOME CARE INSTRUCTIONS  Take medicines only as directed by your health care provider.  Do not insert objects into the ear with the intent of cleaning the ear. PREVENTION  Do not insert objects into the ear, even with the intent of cleaning the ear. Removing cerumen as a part of normal hygiene is not necessary, and the use of swabs in the ear canal is not recommended.  Drink enough water to keep your urine clear or pale yellow.  Control your eczema if you have  it. SEEK MEDICAL CARE IF:  You develop ear pain.  You develop bleeding from the ear.  The cerumen does not clear after you use ear drops as directed.   This information is not intended to replace advice given to you by your health care provider. Make sure you discuss any questions you have with your health care provider.   Document Released: 07/29/2004 Document Revised: 07/12/2014 Document Reviewed: 02/05/2015 Elsevier Interactive Patient Education [employer].

## 2015-12-04 NOTE — ED Provider Notes (Signed)
CSN: [id number]     Arrival date & time 12/04/15  1138 History  By signing my name below, I, [name] [name], attest that this documentation has been prepared under the direction and in the presence of [name], PA-C Electronically Signed: Ladene Artist, ED Scribe 12/04/2015 at 1:20 PM.   Chief Complaint  Patient presents with  . Otalgia   The history is provided by the patient. No language interpreter was used.   HPI Comments: [name] [name] is a 50 y.o. female, with a h/o DM and HTN, who presents to the Emergency Department complaining of constant left ear pain with warmth for the past 5 days. Pt states that pain radiates into her left jaw and is exacerbated with lying on her ear. Pt was seen by her PCP last week for ongoing left-sided HA x1 month and tinnitus. She was encouraged to try a home cerumen removal kit which she states improved symptoms on the right but not the left 2 days ago. She is still experiencing associated symptoms of tinnitus, but is also having decreased hearing and a raspy voice. She denies dental pain, sore throat, ear discharge. No h/o prior ear infections.   Past Medical History  Diagnosis Date  . Diabetes mellitus   . Hypertension   . Heart murmur   . Mitral regurgitation   . Family history of adverse reaction to anesthesia     " my uncle's heart stoped "  . Syncope and collapse 07/28/2015  . GERD (gastroesophageal reflux disease)   . Neuropathy in diabetes ([place])   . 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 Abdominal hysterectomy    . Tubal ligation    . Tonsillectomy    . Cesarean section    . Ganglion cyst excision    . Eye surgery Left retinal  . Cholecystectomy     Family History  Problem Relation Age of Onset  . Diabetes Father   . Heart attack Father    Social History  Substance Use Topics  . Smoking status: Never Smoker   . Smokeless tobacco: Never Used  . Alcohol Use: No   OB History     No data available     Review of Systems  HENT: Positive for ear pain, hearing loss, tinnitus and voice change. Negative for dental problem, ear discharge and sore throat.   Neurological: Positive for headaches (unchanged).   Allergies  Toujeo solostar and Tylenol  Home Medications   Prior to Admission medications   Medication Sig Start Date End Date Taking? Authorizing Provider  amLODipine (NORVASC) 5 MG tablet Take 1 tablet (5 mg total) by mouth daily. 05/03/15   Eulas Post, MD  atorvastatin (LIPITOR) 20 MG tablet Take 1 tablet (20 mg total) by mouth daily. 11/21/15   Biagio Borg, MD  Cyanocobalamin (VITAMIN B-12 PO) Take 1 tablet by mouth daily.    Historical Provider, MD  furosemide (LASIX) 20 MG tablet Take 1 tablet (20 mg total) by mouth daily. 07/30/15   Maryann Mikhail, DO  Insulin Glargine (TOUJEO SOLOSTAR) 300 UNIT/ML SOPN Inject 50 Units into the skin at bedtime. 11/21/15   Biagio Borg, MD  insulin regular human CONCENTRATED (HUMULIN R U-500 KWIKPEN) 500 UNIT/ML kwikpen Inject 14-18 Units into the skin 2 (two) times daily with a meal. 18 units in the morning and 14 units in the evening    Historical Provider, MD  losartan (COZAAR) 100 MG tablet  TAKE 1 TABLET(100 MG) BY MOUTH DAILY 10/15/15   Biagio Borg, MD  losartan (COZAAR) 25 MG tablet Take 1 tablet (25 mg total) by mouth daily. 07/30/15   Maryann Mikhail, DO  pantoprazole (PROTONIX) 40 MG tablet TAKE 1 TABLET BY MOUTH EVERY DAY 08/29/15   Biagio Borg, MD  potassium chloride (K-DUR) 10 MEQ tablet Take 1 tablet (10 mEq total) by mouth daily. 07/30/15   Maryann Mikhail, DO  pregabalin (LYRICA) 150 MG capsule Take 1 capsule (150 mg total) by mouth 2 (two) times daily. 11/19/15   Biagio Borg, MD  traMADol Veatrice Bourbon) 50 MG tablet Take one every 6 hours as needed for pain 11/24/15   Milton Ferguson, MD   BP 175/96 mmHg  Pulse 87  Temp(Src) 98.9 F (37.2 C) (Oral)  Resp 18  Ht 5' (1.524 m)  Wt 160 lb (72.576 kg)  BMI 31.25  kg/m2  SpO2 100% Physical Exam  Constitutional: She is oriented to person, place, and time. She appears well-developed and well-nourished. No distress.  HENT:  Head: Normocephalic and atraumatic.  Cerumen impaction on the L; unable to visualize TM Mild cerumen on the R Trachel midline No tonsillar enlargement  No mastoiditis  Tenderness noted to tracheal manipulation of L ear  Eyes: Conjunctivae and EOM are normal.  Neck: Neck supple. No tracheal deviation present.  Cardiovascular: Normal rate.   Pulmonary/Chest: Effort normal. No respiratory distress.  Musculoskeletal: Normal range of motion.  Neurological: She is alert and oriented to person, place, and time.  Skin: Skin is warm and dry.  Psychiatric: She has a normal mood and affect. Her behavior is normal.  Nursing note and vitals reviewed.  ED Course  .Ear Cerumen Removal Date/Time: 12/04/2015 1:22 PM Performed by: [name] Authorized by: [name] Consent: Verbal consent obtained. Risks and benefits: risks, benefits and alternatives were discussed Consent given by: patient Patient understanding: patient states understanding of the procedure being performed Patient consent: the patient's understanding of the procedure matches consent given Patient identity confirmed: verbally with patient Local anesthetic: none Ceruminolytics applied: Ceruminolytics applied prior to the procedure. Location details: left ear Procedure type: curette and irrigation Patient sedated: no Patient tolerance: Patient tolerated the procedure well with no immediate complications   (including critical care time) DIAGNOSTIC STUDIES: Oxygen Saturation is 100% on RA, normal by my interpretation.    COORDINATION OF CARE: 12:30 PM-Discussed treatment plan which includes left ear irrigation and Ciprodex with pt at bedside and pt agreed to plan.     MDM   Final diagnoses:  Left ear pain  Cerumen impaction, left  Tinnitus of left ear     BP 175/96 mmHg  Pulse 87  Temp(Src) 98.9 F (37.2 C) (Oral)  Resp 18  Ht 5' (1.524 m)  Wt 72.576 kg  BMI 31.25 kg/m2  SpO2 100%  The patient was noted to be hypertensive today in the emergency department. I have spoken with the patient regarding hypertension and the need for improved management. I instructed the patient to followup with the Primary care doctor within 4 days to improve the management of the patient's hypertension. I also counseled the patient regarding the signs and symptoms which would require an emergent visit to an emergency department for hypertensive urgency and/or hypertensive emergency. The patient understood the need for improved hypertensive management.   I personally performed the services described in this documentation, which was scribed in my presence. The recorded information has been reviewed and is accurate.  12:53 PM Patient here with ringing sounds in both the ears left greater than right and also with evidence of cerumen impaction in the left ear. No significant hearing loss. She does have some tenderness to manipulation soft. Her right tragus but her ear canal appears to be normal. Plan to irrigate her left ear. She is however complaining of persistent headache which is ongoing for more than a month. She is taking NSAIDs on a regular basis including ibuprofen. This medication may increase the risk of ototoxicity which can potentially cause her tinnitus. I encouraged patient to avoid NSAIDs at this time. If her symptoms not improve with ear irrigation, I encouraged patient to follow with her PCP for further care. She may need to be seen by neurologist on ENT specialist for further evaluation of her complaint. Since pt's ear canal is tender on reexamination, will provide ciprodex otic drops as treatment for potential otitis externa.    [name], PA-C 12/04/15 [place], MD 12/04/15 1704

## 2015-12-04 NOTE — ED Notes (Signed)
Pt c/o L ear pain that radiates down to her jaw x2 weeks.

## 2015-12-10 ENCOUNTER — Telehealth: Payer: Self-pay

## 2015-12-10 NOTE — Telephone Encounter (Signed)
.  5.17 Pt dropped off Initial Claim Report for Disability Insurance for Dr. Jenny Reichmann to complete. Pt was made aware that Dr. Jenny Reichmann will be out of the office this week. Please call pt at [phone] with any questions. Pt states she will call with a fax number so we can fax it when complete. Placed in Kayly Kriegel's box for further review. MS   Form partially filled out and placed on MD's desk for completion when he returns to office 12/16/2015

## 2015-12-17 ENCOUNTER — Other Ambulatory Visit: Payer: Self-pay | Admitting: Endocrinology

## 2015-12-19 ENCOUNTER — Ambulatory Visit (INDEPENDENT_AMBULATORY_CARE_PROVIDER_SITE_OTHER): Payer: Medicare Other | Admitting: Podiatry

## 2015-12-19 ENCOUNTER — Encounter: Payer: Self-pay | Admitting: Podiatry

## 2015-12-19 VITALS — BP 174/107 | HR 74 | Resp 14

## 2015-12-19 DIAGNOSIS — B351 Tinea unguium: Secondary | ICD-10-CM | POA: Diagnosis not present

## 2015-12-19 DIAGNOSIS — M79676 Pain in unspecified toe(s): Secondary | ICD-10-CM | POA: Diagnosis not present

## 2015-12-19 DIAGNOSIS — E119 Type 2 diabetes mellitus without complications: Secondary | ICD-10-CM

## 2015-12-19 NOTE — Telephone Encounter (Signed)
Contacted pt for additional information per Dr. Gwynn Burly request.  Information forwarded to PCP for review and completion of paperwork.

## 2015-12-19 NOTE — Progress Notes (Signed)
   Subjective:    Patient ID: [name] [name], female    DOB: 1966/03/22, 50 y.o.   MRN: [id number]  HPI this patient returns to the office for a foot exam and treatment of her long painful nails. She says her nails are painful as she she walks and wears her shoes. She is a diabetic with neuropathy. She has a history of ganglion  resection on the top of the left foot, which has led to mild neuritis left foot.  She is interested in getting a pedicure, but she desires the use of sterile instrumentation and presents the office today for an evaluation and treatment    Review of Systems  All other systems reviewed and are negative.      Objective:   Physical Exam GENERAL APPEARANCE: Alert, conversant. Appropriately groomed. No acute distress.  VASCULAR: Pedal pulses are  palpable at  [facility] and PT bilateral.  Capillary refill time is immediate to all digits,  Normal temperature gradient.  Digital hair growth is present bilateral  NEUROLOGIC: sensation is normal to 5.07 monofilament at 5/5 sites bilateral.  Light touch is intact bilateral, Muscle strength normal.  MUSCULOSKELETAL: acceptable muscle strength, tone and stability bilateral.  Intrinsic muscluature intact bilateral.  Rectus appearance of foot and digits noted bilateral.   DERMATOLOGIC: skin color, texture, and turgor are within normal limits.  No preulcerative lesions or ulcers  are seen, no interdigital maceration noted.  No open lesions present.   No drainage noted.  NAILS  Thick disfigured discolored nails great toes B/L.  No evidence of redness or infection.         Assessment & Plan:  Onychomycosis B/L  Diabetes with no foot complications.  IE  Debridement of onychomycotic nails  B/L. RTC 3 weeks.  Gardiner Barefoot DPM

## 2015-12-23 ENCOUNTER — Other Ambulatory Visit: Payer: Self-pay | Admitting: Internal Medicine

## 2015-12-24 NOTE — Telephone Encounter (Signed)
Paperwork signed, copy sent to scan, original placed in cabinet for pt pick up. Pt advised of same

## 2016-01-07 ENCOUNTER — Telehealth: Payer: Self-pay

## 2016-01-07 ENCOUNTER — Telehealth: Payer: Self-pay | Admitting: Internal Medicine

## 2016-01-07 DIAGNOSIS — G6289 Other specified polyneuropathies: Secondary | ICD-10-CM

## 2016-01-07 NOTE — Telephone Encounter (Signed)
Patient has decided she does not want to do pain management she would like to be referred to [facility]

## 2016-01-07 NOTE — Telephone Encounter (Signed)
Please advise patient would like to be sent to pain management

## 2016-01-07 NOTE — Telephone Encounter (Signed)
Pt is already on high dose lyrica.   Neurology does not treat chronic pain  Can I refer to Pain management instead?

## 2016-01-07 NOTE — Telephone Encounter (Signed)
Patient would go to [facility] due to diabetic neuropathy.. She her sister goes there for the same thing. And they do treat pain. She would really like the nurse to call her back. Please follow up.

## 2016-01-07 NOTE — Telephone Encounter (Signed)
Pt request referral for [name] due to diabetic neuropathy. Please help, she is in a lot of pain.

## 2016-01-07 NOTE — Telephone Encounter (Signed)
Referral has been done

## 2016-01-08 NOTE — Telephone Encounter (Signed)
Pt aware.

## 2016-01-22 ENCOUNTER — Ambulatory Visit (INDEPENDENT_AMBULATORY_CARE_PROVIDER_SITE_OTHER): Payer: 59 | Admitting: Neurology

## 2016-01-22 ENCOUNTER — Encounter: Payer: Self-pay | Admitting: Neurology

## 2016-01-22 VITALS — BP 136/88 | HR 78 | Resp 16 | Ht 60.0 in | Wt 165.0 lb

## 2016-01-22 DIAGNOSIS — E1140 Type 2 diabetes mellitus with diabetic neuropathy, unspecified: Secondary | ICD-10-CM

## 2016-01-22 DIAGNOSIS — R519 Headache, unspecified: Secondary | ICD-10-CM

## 2016-01-22 DIAGNOSIS — R51 Headache: Secondary | ICD-10-CM

## 2016-01-22 DIAGNOSIS — R0683 Snoring: Secondary | ICD-10-CM

## 2016-01-22 DIAGNOSIS — E1165 Type 2 diabetes mellitus with hyperglycemia: Secondary | ICD-10-CM | POA: Diagnosis not present

## 2016-01-22 DIAGNOSIS — E669 Obesity, unspecified: Secondary | ICD-10-CM

## 2016-01-22 MED ORDER — AMITRIPTYLINE HCL 25 MG PO TABS: ORAL_TABLET | ORAL | Status: DC

## 2016-01-22 NOTE — Progress Notes (Signed)
Subjective:    Patient ID: [name] [name] is a 50 y.o. female.  HPI     Star Age, MD, PhD [facility] [address], [address] [place], [place] [address]  Dear Dr. Jenny Reichmann,   I saw your patient, [name] [name], upon your kind request in my neurologic clinic today for initial consultation of her diabetic neuropathy. The patient is accompanied by her young daughter today. As you know, [name] [name] is a 50 year old right-handed woman with an underlying medical history of hypertension, type 2 diabetes with diagnosis of diabetic neuropathy some years ago, mitral regurgitation with heart murmur, reflux disease, history of syncope, and obesity, who reports numbness, tingling, painful electric jolt-like sensation and burning sensation in her legs, up to knees or even thighs for the past few years. Neuropathy symptoms have progressed with time. Unfortunately, her diabetes has been poorly controlled. Most recent A1c in May 2017 was 16.3, prior to that in November 2016 was 19. She had previously seen Dr. Dwyane Dee in endocrinology, last seen in May 2016. Since then, she says that she had to cancel appointments because of lack of insurance and losing her job. She recently no showed for an appointment with him as well as cancel 2 appointments from what I can see. She is motivated to make another appointment with him. For her painful diabetic neuropathy she is currently on Lyrica 150 mg twice daily. She feels that it is marginally helpful, symptoms are worse at night. She has previously been tried on gabapentin, she is unclear of the dose, I can see where she was on gabapentin 300 mg 3 times a day, she was also on Cymbalta sometime ago. She is not sure why it was stopped. She has not been on amitriptyline. She has no heart disease but has a history of heart murmur. She is recently separated. She has 3 children, youngest start a lives with her. She moved from [place]  recently. She goes to bed around 10 PM and wakeup time is between 10 and 11. She snores. She has frequent morning headaches, she has nocturia, once per night. I reviewed your office note from 11/19/2015.  She has had painful diabetic neuropathy for years. She was recently advised to consider pain management referral, as per phone note from 01/07/2016, but at that time she did not wish to pursue that.   Her Past Medical History Is Significant For: Past Medical History  Diagnosis Date  . Diabetes mellitus   . Hypertension   . Heart murmur   . Mitral regurgitation   . Family history of adverse reaction to anesthesia     " my uncle's heart stoped "  . Syncope and collapse 07/28/2015  . GERD (gastroesophageal reflux disease)   . Neuropathy in diabetes ([place])   . Impaired vision in both eyes 11/19/2015    Her Past Surgical History Is Significant For: Past Surgical History  Procedure Laterality Date  . Abdominal hysterectomy    . Tubal ligation    . Tonsillectomy    . Cesarean section    . Ganglion cyst excision    . Eye surgery Left retinal  . Cholecystectomy    . Cataract extraction      Her Family History Is Significant For: Family History  Problem Relation Age of Onset  . Diabetes Father   . Heart attack Father   . Aneurysm Mother     Her Social History Is Significant For: Social History   Social  History  . Marital Status: Married    Spouse Name: N/A  . Number of Children: 3  . Years of Education: College   Occupational History  . Disabled    Social History Main Topics  . Smoking status: Never Smoker   . Smokeless tobacco: Never Used  . Alcohol Use: No  . Drug Use: No  . Sexual Activity: Yes   Other Topics Concern  . None   Social History Narrative   Drinks about 1 cup of coffee in the morning.     Her Allergies Are:  Allergies  Allergen Reactions  . Tylenol [Acetaminophen] Itching  :   Her Current Medications Are:  Outpatient Encounter  Prescriptions as of 01/22/2016  Medication Sig  . amLODipine (NORVASC) 5 MG tablet Take 1 tablet (5 mg total) by mouth daily.  Marland Kitchen atorvastatin (LIPITOR) 20 MG tablet Take 1 tablet (20 mg total) by mouth daily.  . Cyanocobalamin (VITAMIN B-12 PO) Take 1 tablet by mouth daily.  . furosemide (LASIX) 20 MG tablet Take 1 tablet (20 mg total) by mouth daily.  Marland Kitchen HUMULIN R U-500 KWIKPEN 500 UNIT/ML kwikpen INJECT 18 UNITS UNDER THE SKIN EVERY MORNING AND 14 UNITS EVERY EVENING  . Insulin Glargine (TOUJEO SOLOSTAR) 300 UNIT/ML SOPN Inject 50 Units into the skin at bedtime.  . insulin regular human CONCENTRATED (HUMULIN R U-500 KWIKPEN) 500 UNIT/ML kwikpen Inject 14-18 Units into the skin 2 (two) times daily with a meal. 18 units in the morning and 14 units in the evening  . losartan (COZAAR) 25 MG tablet Take 1 tablet (25 mg total) by mouth daily.  . potassium chloride (K-DUR) 10 MEQ tablet Take 1 tablet (10 mEq total) by mouth daily.  . pregabalin (LYRICA) 150 MG capsule Take 1 capsule (150 mg total) by mouth 2 (two) times daily.  Marland Kitchen amitriptyline (ELAVIL) 25 MG tablet 1/2 pill each bedtime x 1 week, then 1 pill nightly x 1 week, then 1 1/2 pills nightly x 1 week, then 2 pills nightly thereafter.  . [DISCONTINUED] losartan (COZAAR) 100 MG tablet TAKE 1 TABLET(100 MG) BY MOUTH DAILY  . [DISCONTINUED] pantoprazole (PROTONIX) 40 MG tablet TAKE 1 TABLET BY MOUTH EVERY DAY  . [DISCONTINUED] traMADol (ULTRAM) 50 MG tablet Take one every 6 hours as needed for pain   No facility-administered encounter medications on file as of 01/22/2016.  : Review of Systems:  Out of a complete 14 point review of systems, all are reviewed and negative with the exception of these symptoms as listed below:   Review of Systems  Neurological:       Patient reports that she has neuropathy in both of her feet, legs, and hands. She takes 150mg  Lyrica twice a day "with little relief".     Objective:  Neurologic Exam  Physical  Exam Physical Examination:   Filed Vitals:   01/22/16 1103  BP: 136/88  Pulse: 78  Resp: 16   General Examination: The patient is a very pleasant 50 y.o. female in no acute distress. She appears well-developed and well-nourished and well groomed.   HEENT: Normocephalic, atraumatic, pupils are equal, round and reactive to light and accommodation. Funduscopic exam is normal with sharp disc margins noted. Extraocular tracking is good without limitation to gaze excursion or nystagmus noted. Normal smooth pursuit is noted. Hearing is grossly intact. Tympanic membranes are clear bilaterally. Face is symmetric with normal facial animation and normal facial sensation. Speech is clear with no dysarthria noted. There is no hypophonia.  There is no lip, neck/head, jaw or voice tremor. Neck is supple with full range of passive and active motion. There are no carotid bruits on auscultation. Oropharynx exam reveals: mild mouth dryness, adequate dental hygiene and mild airway crowding, due to smaller airway entry and larger appearing tongue. Mallampati is class II. Tongue protrudes centrally and palate elevates symmetrically. Tonsils are absent.   Chest: Clear to auscultation without wheezing, rhonchi or crackles noted.  Heart: S1+S2+0, regular and without a 2/6 systolic murmur, no rubs or gallops noted.   Abdomen: Soft, non-tender and non-distended with normal bowel sounds appreciated on auscultation.  Extremities: There is 1+ pitting edema in the distal lower extremities bilaterally, mostly around the ankles. Pedal pulses are intact.  Skin: Warm and dry without trophic changes noted. There are no varicose veins.  Musculoskeletal: exam reveals no obvious joint deformities, tenderness or joint swelling or erythema. She has multiple scars over the dorsum of the left foot from prior surgeries.   Neurologically:  Mental status: The patient is awake, alert and oriented in all 4 spheres. Her immediate and  remote memory, attention, language skills and fund of knowledge are appropriate. There is no evidence of aphasia, agnosia, apraxia or anomia. Speech is clear with normal prosody and enunciation. Thought process is linear. Mood is normal and affect is normal.  Cranial nerves II - XII are as described above under HEENT exam. In addition: shoulder shrug is normal with equal shoulder height noted. Motor exam: Normal bulk, strength and tone is noted, with the exception of mild bilateral hip flexor weakness noted. There is no drift, tremor or rebound. Romberg is negative. Reflexes are 1+ in the upper extremities, trace in both knees, absent in both ankles. Babinski: Toes are flexor bilaterally. Fine motor skills and coordination: intact with normal finger taps, normal hand movements, normal rapid alternating patting, normal foot taps and normal foot agility.  Cerebellar testing: No dysmetria or intention tremor on finger to nose testing. Heel to shin is unremarkable bilaterally. There is no truncal or gait ataxia.  Sensory exam: intact to light touch, pinprick, vibration, temperature sense in the upper extremities and decrease to all modalities up to above the knees bilaterally.  Gait, station and balance: She stands with difficulty. No veering to one side is noted. No leaning to one side is noted. Posture is age-appropriate and stance is narrow based. Gait shows normal stride length and normal pace. No problems turning are noted.  Assessment and plan:   In summary, ALAINAH LIGHTLE is a very pleasant 50 y.o.-year old female with an underlying medical history of hypertension, type 2 diabetes with diagnosis of diabetic neuropathy some years ago, mitral regurgitation with heart murmur, reflux disease, history of syncope, and obesity, who presents with signs and symptoms of painful diabetic neuropathy. This is in the context of poorly controlled diabetes. She was also advised to start Lipitor recently. Unfortunately,  she has not seen her endocrinologist in over a year. I explained to her that it is critical that she have better diabetes control and that she make an appointment with her endocrinologist. For symptomatic treatment of her painful neuropathy she has been on gabapentin in the past as well as Cymbalta, currently on Lyrica 150 mg twice daily. I explained to her that this is the recommended maximum dose. This leaves [place] unfortunately not with many potential treatment options. I will add a small dose of amitriptyline at this time starting at 25 mg strength half a pill at night  with gradual titration. I talked her about potential side effects and titration and gave her written instructions. Diagnostic testing will include EMG and nerve conduction study which we will schedule. In addition, she is at risk for obstructive sleep apnea, given her history of snoring, obesity, and morning headaches. Patients who have sleep apnea and are treated for sleep apnea can have improvement in her diabetes control. Therefore, we will also schedule her for a overnight sleep study and treat her for sleep apnea as necessary. She is in agreement. I will see her back routinely after her tests are done and we will also keep her posted the a phone call as to the results. She is advised to continue with Lyrica at the current dose.  I answered all her questions today and the patient was in agreement.   Thank you very much for allowing me to participate in the care of this nice patient. If I can be of any further assistance to you please do not hesitate to call me at [phone].  Sincerely,   Star Age, MD, PhD

## 2016-01-22 NOTE — Patient Instructions (Addendum)
You likely have diabetic peripheral neuropathy, i. e. nerve damage. There is no specific treatment for most neuropathies. The most common cause for neuropathy is diabetes in this country, in which case, tight glucose control is key. Other causes include thyroid disease, and some vitamin deficiencies. Certain medications such as chemotherapy agents and other chemicals or toxins including alcohol can cause neuropathy. There are some genetic conditions or hereditary neuropathies. Typically patients will report a family history of neuropathy in those conditions. There are cases associated with cancers and autoimmune conditions. Most neuropathies are progressive unless a root cause can be found and treated. For most neuropathies there is no actual cure or reversing of symptoms. Painful neuropathy can be difficult to treat symptomatically, but there are some medications available to ease the symptoms. Electrophysiologic testing with nerve conduction velocity studies and EMG (muscle testing) do not always pick up neuropathies that affect the smallest fibers. Other common tests include different type of blood work, and rarely, spinal fluid testing, and sometimes we resort to asking for a nerve and muscle biopsy.  We will do an EMG and nerve conduction velocity test, which is an electrical nerve and muscle test, which we will schedule. We will call you with the results.  For now, please continue to take the Lyrica as prescribed.   We will also add Elavil (generic name: amitriptyline) 25 mg: Take half a pill daily at bedtime for one week, then one pill daily at bedtime for one week, then one and a half pills daily at bedtime for one week, then 2 pills daily at bedtime thereafter. Common side effects reported are: mouth dryness, drowsiness, confusion, dizziness.

## 2016-02-10 NOTE — Telephone Encounter (Signed)
Patient has not come to pickup paperwork. Mailing to her address

## 2016-02-20 ENCOUNTER — Encounter: Payer: 59 | Admitting: Neurology

## 2016-02-23 ENCOUNTER — Encounter: Payer: Self-pay | Admitting: Neurology

## 2016-04-04 DIAGNOSIS — R938 Abnormal findings on diagnostic imaging of other specified body structures: Secondary | ICD-10-CM | POA: Diagnosis not present

## 2016-04-04 DIAGNOSIS — R0789 Other chest pain: Secondary | ICD-10-CM | POA: Diagnosis not present

## 2016-04-04 DIAGNOSIS — R109 Unspecified abdominal pain: Secondary | ICD-10-CM | POA: Diagnosis not present

## 2016-04-04 DIAGNOSIS — R05 Cough: Secondary | ICD-10-CM | POA: Diagnosis not present

## 2016-04-04 DIAGNOSIS — M549 Dorsalgia, unspecified: Secondary | ICD-10-CM | POA: Diagnosis not present

## 2016-04-04 IMAGING — CT CT HEAD W/O CM
2 series · 16 of 30 positions shown, 20 images · non-contrast
Comparison: 10/14/2013

CLINICAL DATA: Fall, lost consciousness at work this morning.
Headache.

EXAM:
CT HEAD WITHOUT CONTRAST
TECHNIQUE: Contiguous axial images were obtained from the base of the skull
through the vertex without intravenous contrast.

[Series 201: head w/o, idose (1) · axial · non-contrast · 0.47mm/px · z∈[+182,+302]mm · 13 of 29 slices shown, 17 images]
[im 3/29  brain]
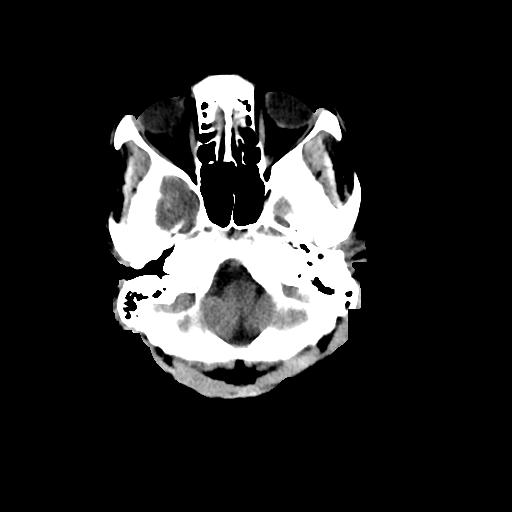
[im 3/29  bone]
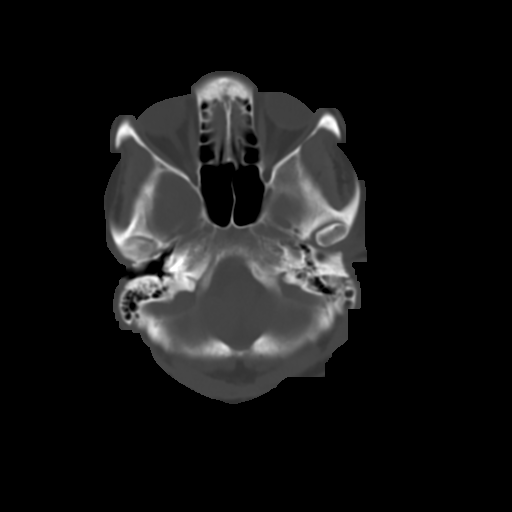
[im 5/29  brain]
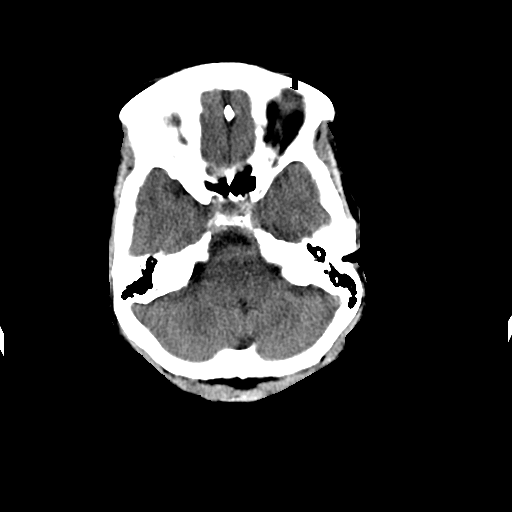
[im 7/29  brain]
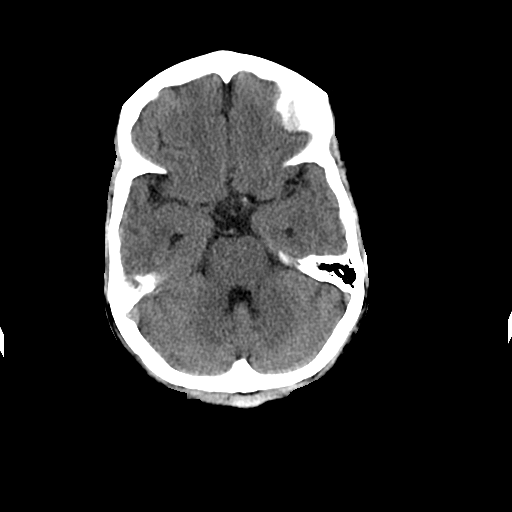
[im 9/29  brain]
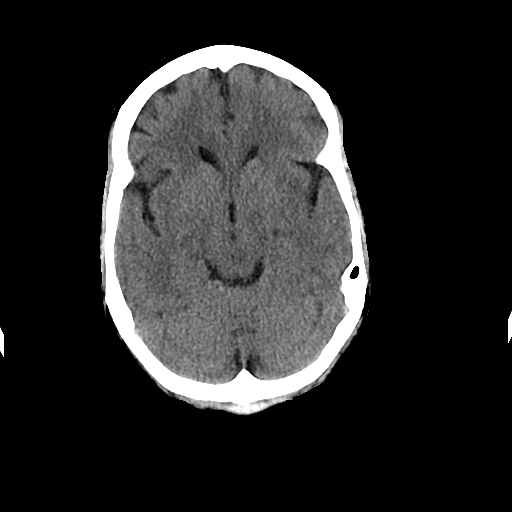
[im 11/29  brain]
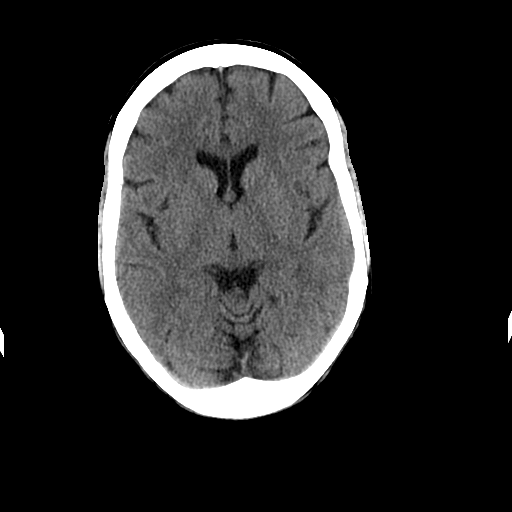
[im 11/29  bone]
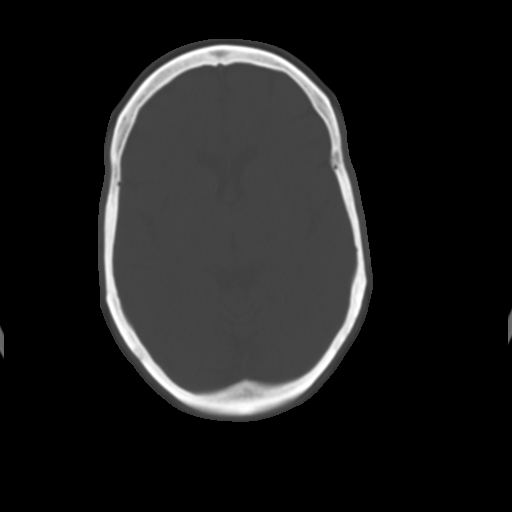
[im 13/29  brain]
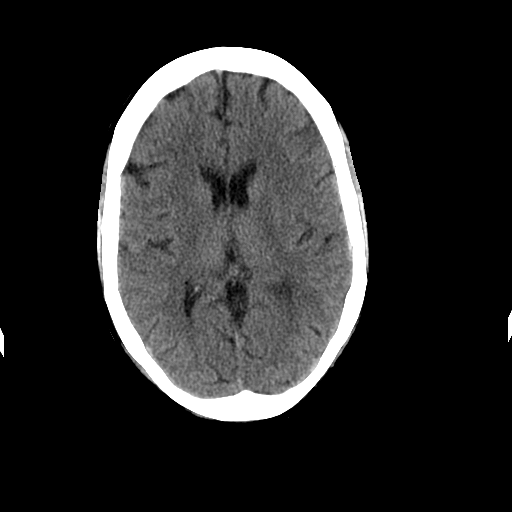
[im 15/29  brain]
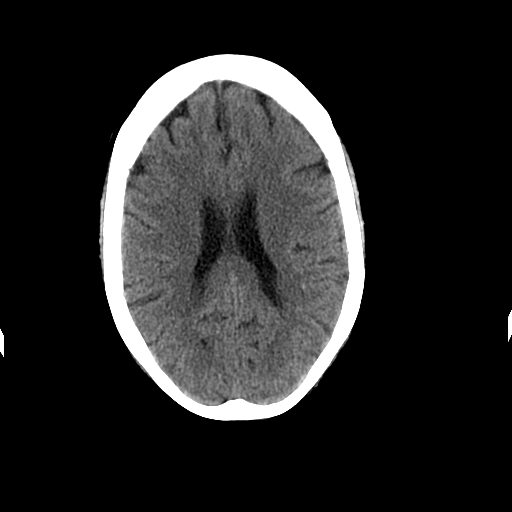
[im 17/29  brain]
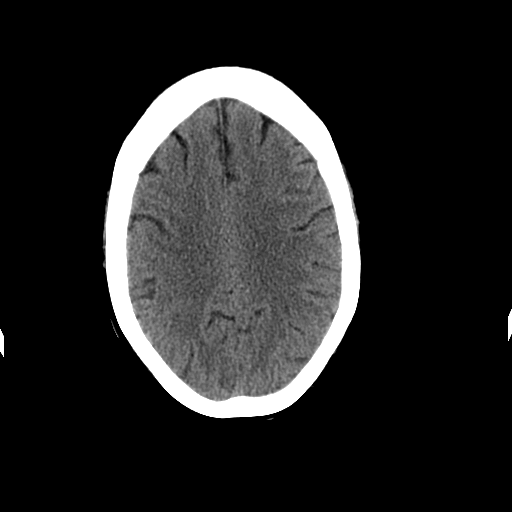
[im 19/29  brain]
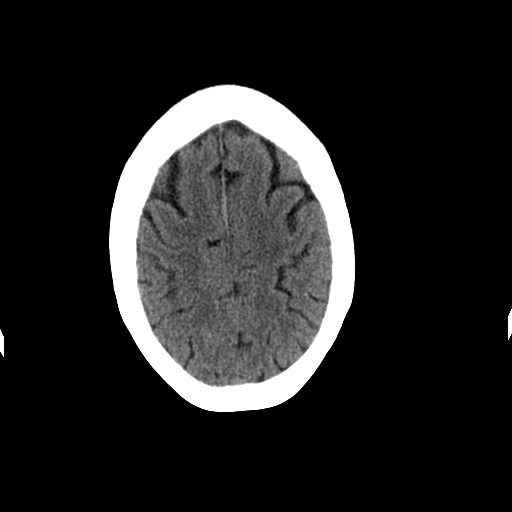
[im 19/29  bone]
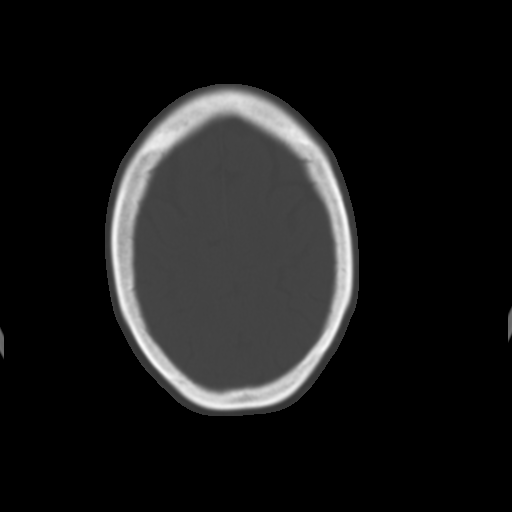
[im 21/29  brain]
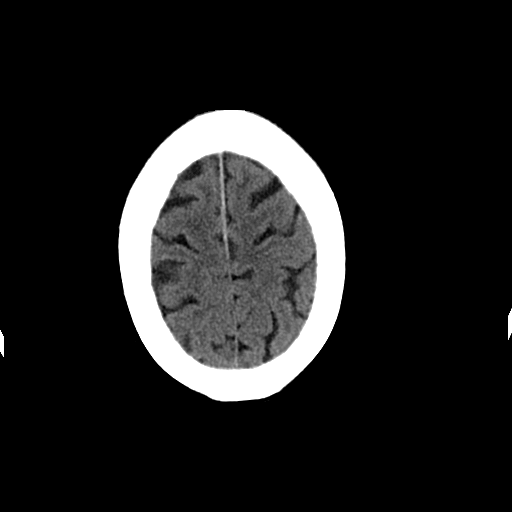
[im 23/29  brain]
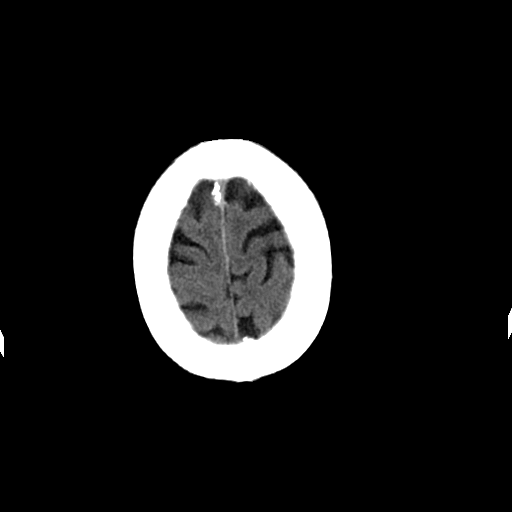
[im 25/29  brain]
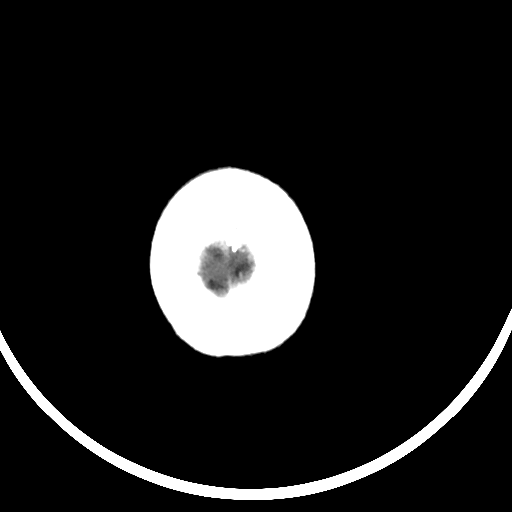
[im 27/29  brain]
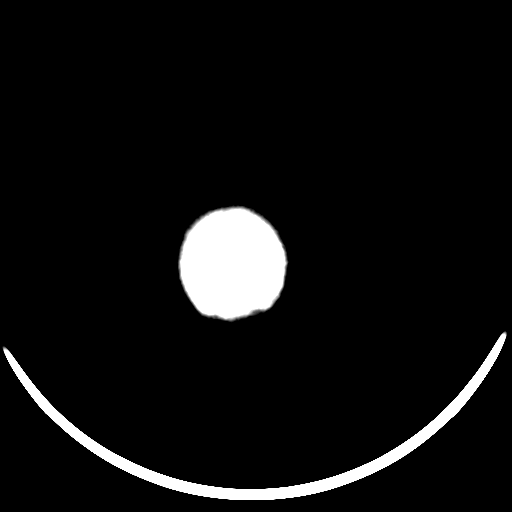
[im 27/29  bone]
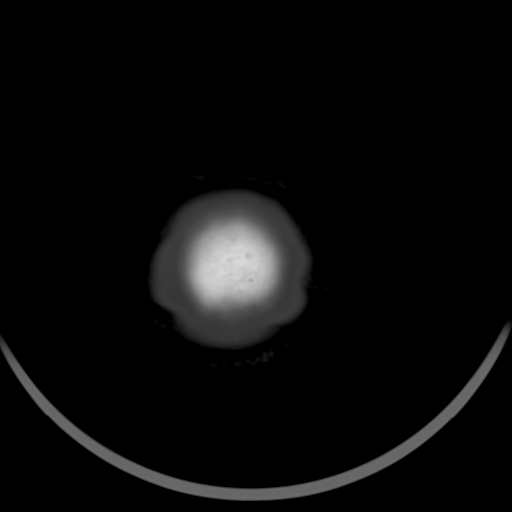

[Series 202: head w/o bone, idose (1) · axial · non-contrast · 0.47mm/px · z∈[+182,+222]mm · 3 of 29 slices shown]
[im 3/29  bone]
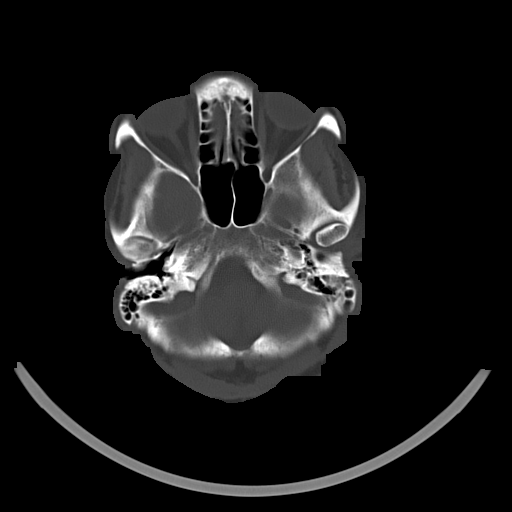
[im 7/29  bone]
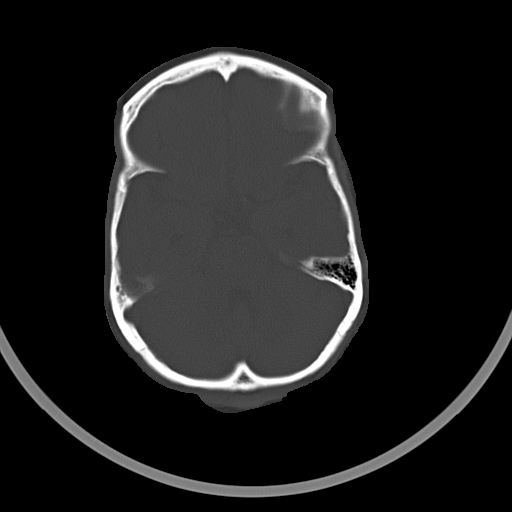
[im 11/29  bone]
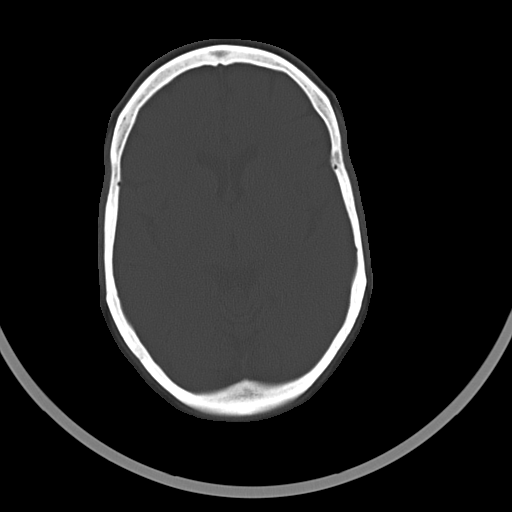

[16 of 30 positions shown; findings below may reference images not displayed]

FINDINGS: The brainstem, cerebellum, cerebral peduncles, thalami, basal
ganglia, basilar cisterns, and ventricular system appear within
normal limits. No intracranial hemorrhage, mass lesion, or acute
CVA.
IMPRESSION: 1.  No significant abnormality identified.

## 2016-04-04 IMAGING — DX DG ELBOW COMPLETE 3+V*R*
4 series · 4 of 4 positions shown · non-contrast
Comparison: None.

CLINICAL DATA: Syncope.  Right elbow pain posteriorly

EXAM:
RIGHT ELBOW - COMPLETE 3+ VIEW

[elbow ap]
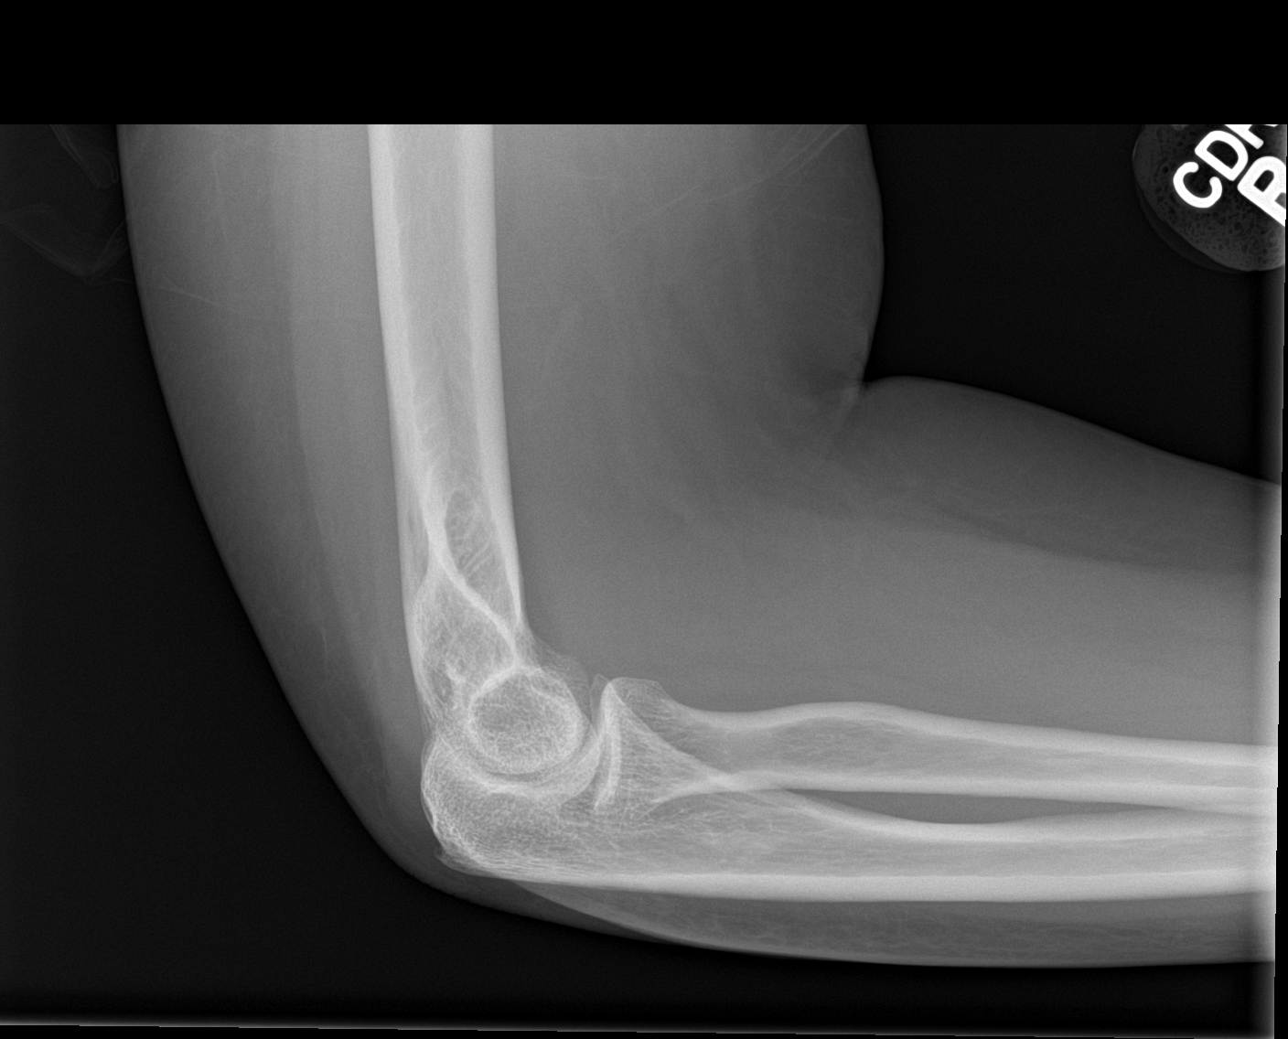

[elbow obl (1 of 2)]
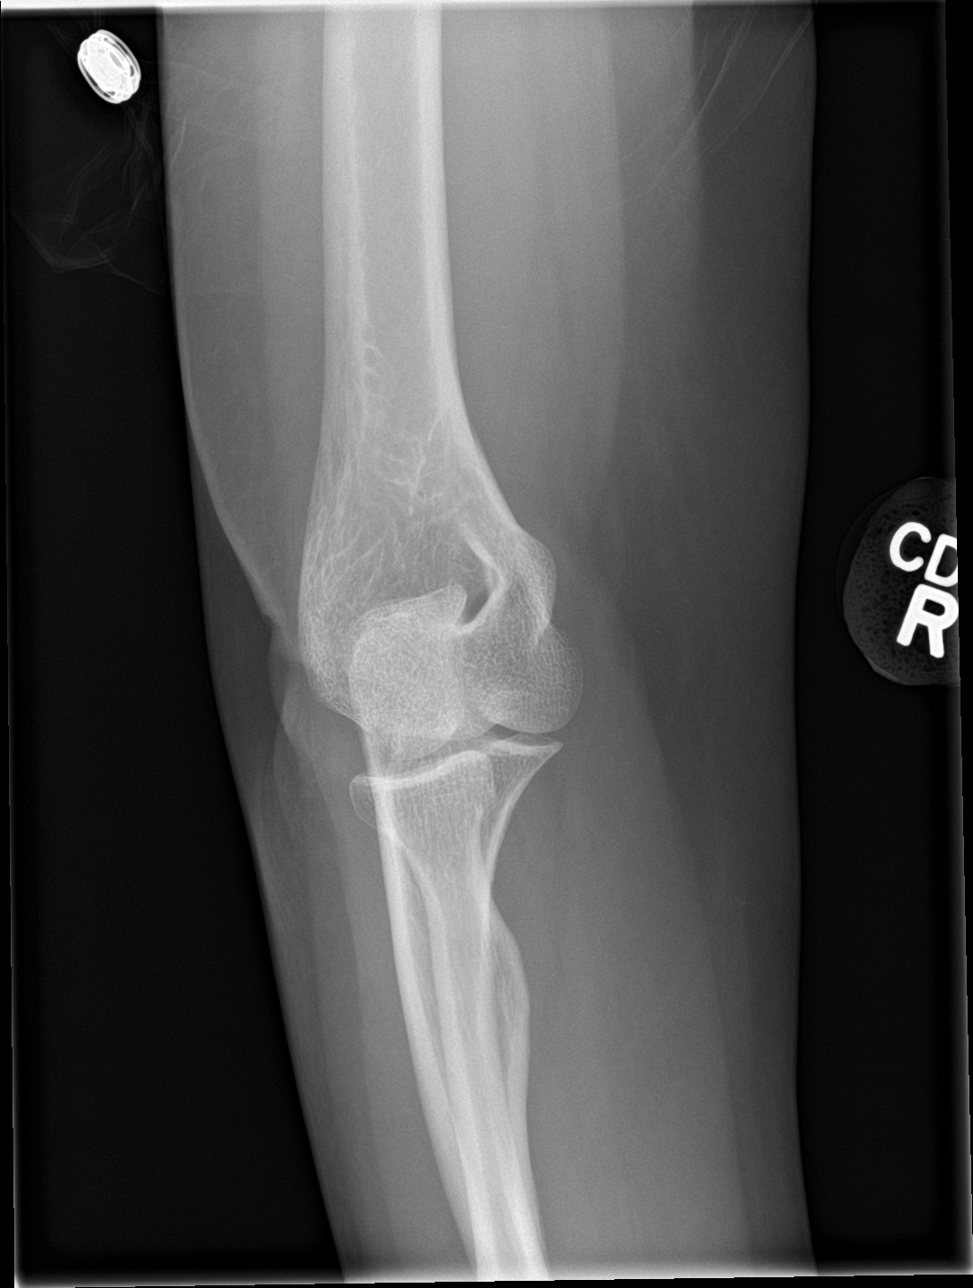

[elbow obl (2 of 2)]
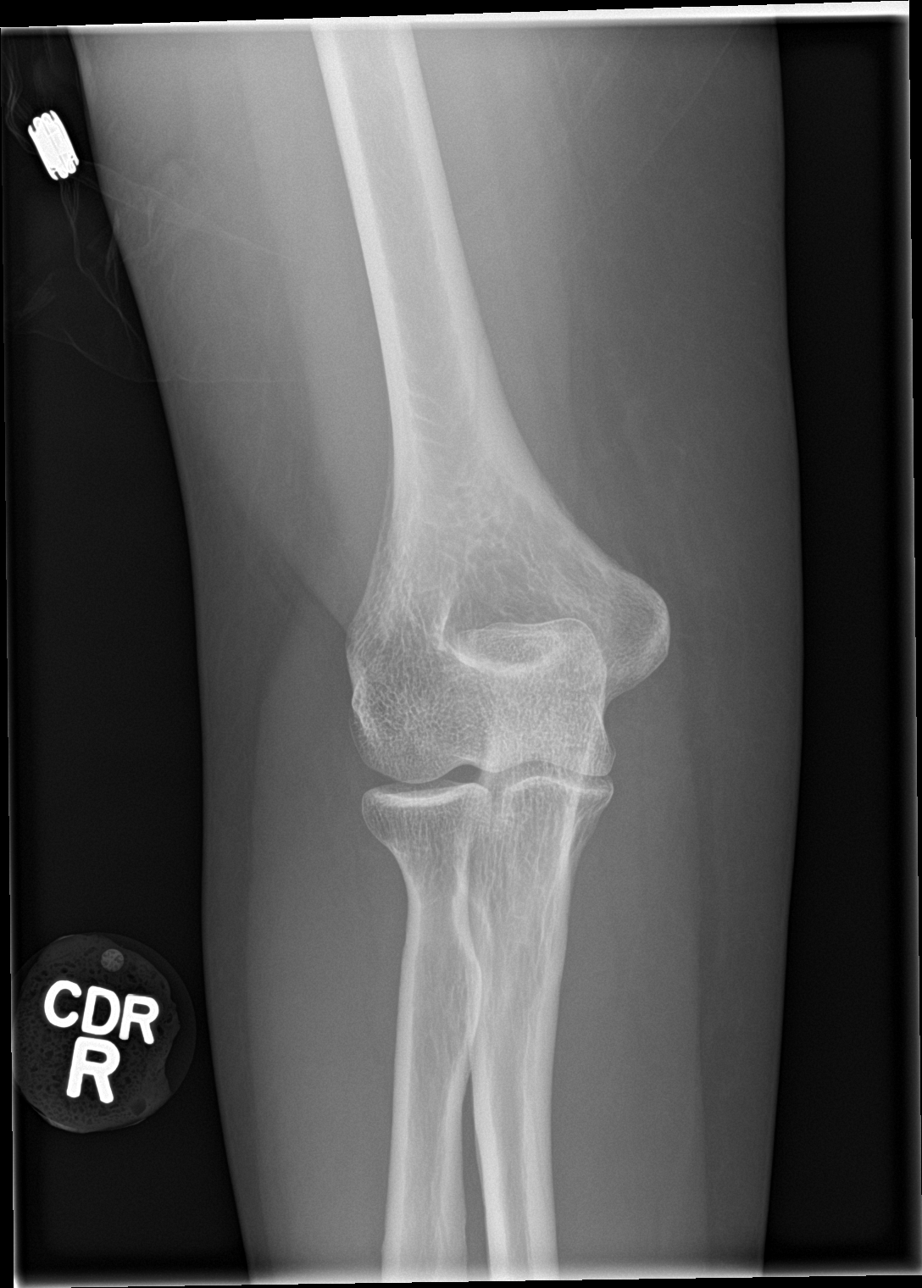

[elbow lat]
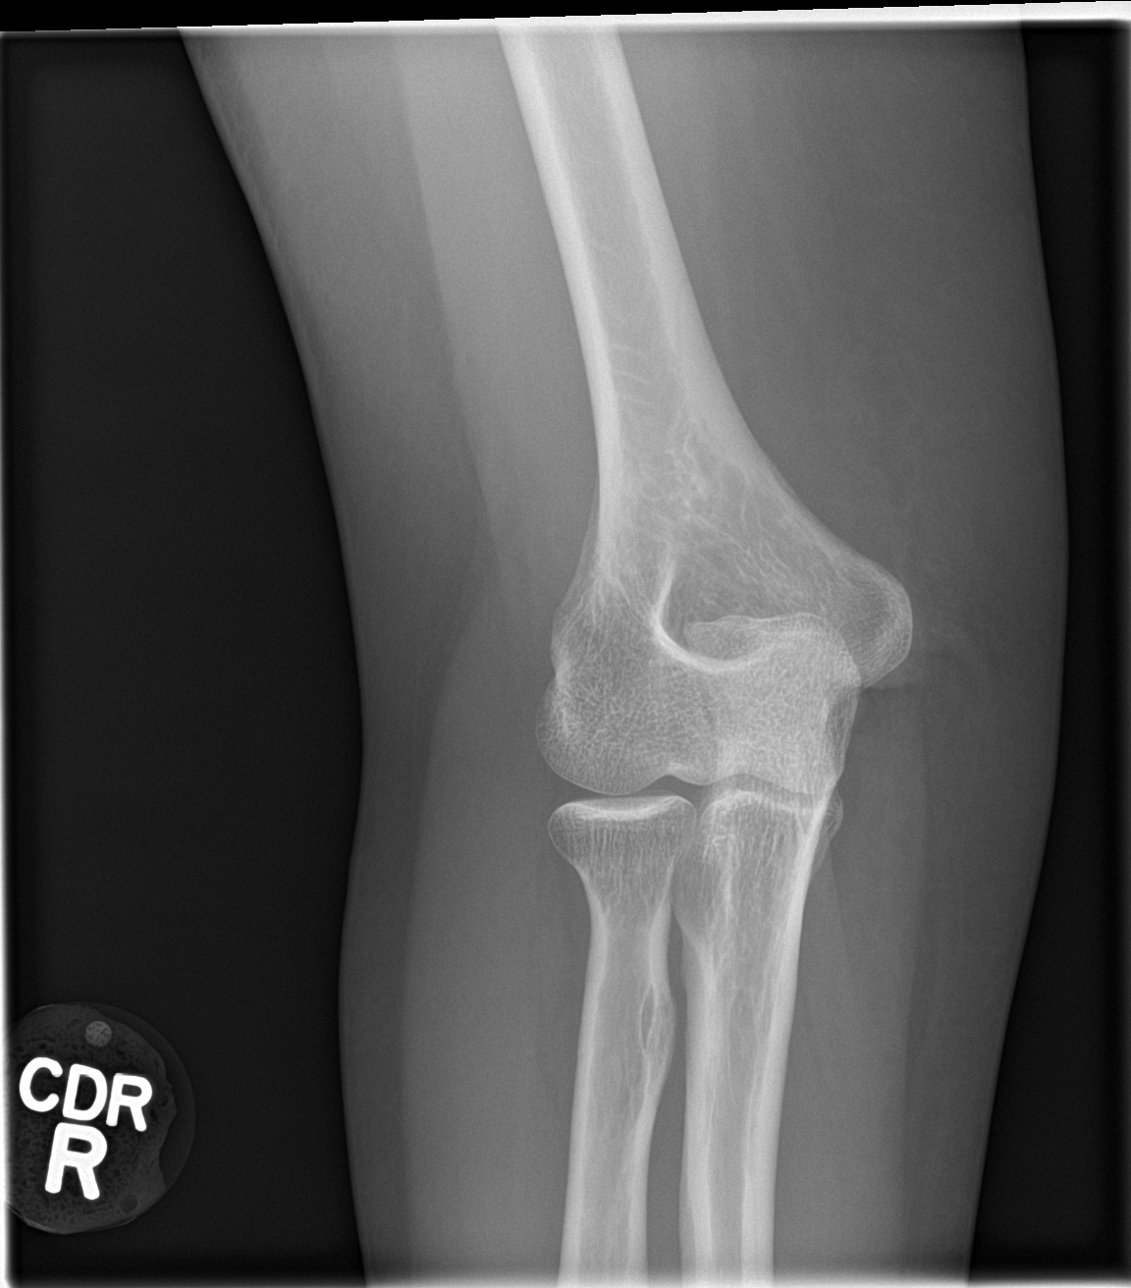

[4 of 4 positions shown; findings below may reference images not displayed]

FINDINGS: There is no evidence of fracture, dislocation, or joint effusion.
There is no evidence of arthropathy or other focal bone abnormality.
Soft tissues are unremarkable.
IMPRESSION: Negative.

## 2016-04-12 DIAGNOSIS — Z79899 Other long term (current) drug therapy: Secondary | ICD-10-CM | POA: Diagnosis not present

## 2016-04-12 DIAGNOSIS — I10 Essential (primary) hypertension: Secondary | ICD-10-CM | POA: Diagnosis not present

## 2016-04-12 DIAGNOSIS — E1165 Type 2 diabetes mellitus with hyperglycemia: Secondary | ICD-10-CM | POA: Diagnosis not present

## 2016-04-12 DIAGNOSIS — Z794 Long term (current) use of insulin: Secondary | ICD-10-CM | POA: Diagnosis not present

## 2016-04-12 DIAGNOSIS — R51 Headache: Secondary | ICD-10-CM | POA: Diagnosis not present

## 2016-04-12 DIAGNOSIS — Z886 Allergy status to analgesic agent status: Secondary | ICD-10-CM | POA: Diagnosis not present

## 2016-04-14 ENCOUNTER — Ambulatory Visit: Payer: Medicare Other | Admitting: Family

## 2016-05-04 DIAGNOSIS — E1165 Type 2 diabetes mellitus with hyperglycemia: Secondary | ICD-10-CM | POA: Diagnosis not present

## 2016-05-04 DIAGNOSIS — I10 Essential (primary) hypertension: Secondary | ICD-10-CM | POA: Diagnosis not present

## 2016-05-04 DIAGNOSIS — G43009 Migraine without aura, not intractable, without status migrainosus: Secondary | ICD-10-CM | POA: Diagnosis not present

## 2016-05-21 ENCOUNTER — Ambulatory Visit: Payer: Self-pay | Admitting: Internal Medicine

## 2016-05-21 DIAGNOSIS — Z0289 Encounter for other administrative examinations: Secondary | ICD-10-CM

## 2017-02-14 ENCOUNTER — Other Ambulatory Visit: Payer: Self-pay

## 2017-02-14 ENCOUNTER — Telehealth: Payer: Self-pay

## 2017-02-14 NOTE — Telephone Encounter (Signed)
Refuse with note to make appointment 

## 2017-02-14 NOTE — Telephone Encounter (Signed)
Last o/v 11/28/14 2 no shows and no future scheduled- patient is requesting refill on B-D pen needle nano refuse or refill please advise

## 2017-02-14 NOTE — Telephone Encounter (Signed)
Noted  

## 2017-03-10 ENCOUNTER — Emergency Department (HOSPITAL_BASED_OUTPATIENT_CLINIC_OR_DEPARTMENT_OTHER)
Admission: EM | Admit: 2017-03-10 | Discharge: 2017-03-11 | Disposition: A | Discharge status: Home / Self Care | Payer: Medicare Other | Attending: Emergency Medicine | Admitting: Emergency Medicine

## 2017-03-10 ENCOUNTER — Emergency Department (HOSPITAL_BASED_OUTPATIENT_CLINIC_OR_DEPARTMENT_OTHER): Payer: Medicare Other

## 2017-03-10 ENCOUNTER — Encounter (HOSPITAL_BASED_OUTPATIENT_CLINIC_OR_DEPARTMENT_OTHER): Payer: Self-pay

## 2017-03-10 DIAGNOSIS — R1030 Lower abdominal pain, unspecified: Secondary | ICD-10-CM | POA: Insufficient documentation

## 2017-03-10 DIAGNOSIS — E119 Type 2 diabetes mellitus without complications: Secondary | ICD-10-CM | POA: Diagnosis not present

## 2017-03-10 DIAGNOSIS — Z79899 Other long term (current) drug therapy: Secondary | ICD-10-CM | POA: Insufficient documentation

## 2017-03-10 DIAGNOSIS — I10 Essential (primary) hypertension: Secondary | ICD-10-CM | POA: Diagnosis not present

## 2017-03-10 DIAGNOSIS — Z794 Long term (current) use of insulin: Secondary | ICD-10-CM | POA: Insufficient documentation

## 2017-03-10 LAB — CBC WITH DIFFERENTIAL/PLATELET
Basophils Absolute: 0 10*3/uL (ref 0.0–0.1)
Basophils Relative: 0 %
Eosinophils Absolute: 0.1 10*3/uL (ref 0.0–0.7)
Eosinophils Relative: 2 %
HCT: 33.5 % — ABNORMAL LOW (ref 36.0–46.0)
Hemoglobin: 11.1 g/dL — ABNORMAL LOW (ref 12.0–15.0)
Lymphocytes Relative: 43 %
Lymphs Abs: 2.1 10*3/uL (ref 0.7–4.0)
MCH: 26.4 pg (ref 26.0–34.0)
MCHC: 33.1 g/dL (ref 30.0–36.0)
MCV: 79.8 fL (ref 78.0–100.0)
Monocytes Absolute: 0.4 10*3/uL (ref 0.1–1.0)
Monocytes Relative: 8 %
Neutro Abs: 2.3 10*3/uL (ref 1.7–7.7)
Neutrophils Relative %: 47 %
Platelets: 183 10*3/uL (ref 150–400)
RBC: 4.2 MIL/uL (ref 3.87–5.11)
RDW: 13 % (ref 11.5–15.5)
WBC: 5 10*3/uL (ref 4.0–10.5)

## 2017-03-10 LAB — BASIC METABOLIC PANEL
Anion gap: 9 (ref 5–15)
BUN: 30 mg/dL — ABNORMAL HIGH (ref 6–20)
CO2: 29 mmol/L (ref 22–32)
Calcium: 8.6 mg/dL — ABNORMAL LOW (ref 8.9–10.3)
Chloride: 98 mmol/L — ABNORMAL LOW (ref 101–111)
Creatinine, Ser: 1.05 mg/dL — ABNORMAL HIGH (ref 0.44–1.00)
GFR calc Af Amer: >60 mL/min (ref >60)
GFR calc non Af Amer: >60 mL/min (ref >60)
Glucose, Bld: 341 mg/dL — ABNORMAL HIGH (ref 65–99)
Potassium: 3.6 mmol/L (ref 3.5–5.1)
Sodium: 136 mmol/L (ref 135–145)

## 2017-03-10 LAB — URINALYSIS, ROUTINE W REFLEX MICROSCOPIC
Bilirubin Urine: NEGATIVE
Glucose, UA: >=500 mg/dL — AB
Ketones, ur: NEGATIVE mg/dL
Leukocytes, UA: NEGATIVE
Nitrite: NEGATIVE
Protein, ur: >300 mg/dL — AB
Specific Gravity, Urine: 1.025 (ref 1.005–1.030)
pH: 6 (ref 5.0–8.0)

## 2017-03-10 LAB — URINALYSIS, MICROSCOPIC (REFLEX)

## 2017-03-10 MED ORDER — HYDROMORPHONE HCL 1 MG/ML IJ SOLN
1.0000 mg | Freq: Once | INTRAMUSCULAR | Status: AC
  Administered 2017-03-11: 1 mg via INTRAVENOUS
  Filled 2017-03-10: qty 1

## 2017-03-10 MED ORDER — ONDANSETRON HCL 4 MG/2ML IJ SOLN
4.0000 mg | Freq: Once | INTRAMUSCULAR | Status: AC
Start: 2017-03-10 — End: 2017-03-10
  Administered 2017-03-10: 4 mg via INTRAVENOUS
  Filled 2017-03-10: qty 2

## 2017-03-10 MED ORDER — IOPAMIDOL (ISOVUE-300) INJECTION 61%
100.0000 mL | Freq: Once | INTRAVENOUS | Status: AC | PRN
  Administered 2017-03-10: 100 mL via INTRAVENOUS

## 2017-03-10 MED ORDER — SODIUM CHLORIDE 0.9 % IV SOLN: 1000.0000 mL | INTRAVENOUS | Status: DC

## 2017-03-10 MED ORDER — SODIUM CHLORIDE 0.9 % IV BOLUS (SEPSIS)
1000.0000 mL | Freq: Once | INTRAVENOUS | Status: AC
  Administered 2017-03-10: 1000 mL via INTRAVENOUS

## 2017-03-10 MED ORDER — CAPSAICIN 0.075 % EX CREA
TOPICAL_CREAM | Freq: Once | CUTANEOUS | Status: AC
  Administered 2017-03-10: 1 via TOPICAL
  Filled 2017-03-10: qty 60

## 2017-03-10 MED ORDER — MORPHINE SULFATE (PF) 4 MG/ML IV SOLN
4.0000 mg | Freq: Once | INTRAVENOUS | Status: AC
  Administered 2017-03-10: 4 mg via INTRAVENOUS
  Filled 2017-03-10: qty 1

## 2017-03-10 MED ORDER — DICYCLOMINE HCL 10 MG PO CAPS
10.0000 mg | ORAL_CAPSULE | Freq: Once | ORAL | Status: AC
  Administered 2017-03-11: 10 mg via ORAL
  Filled 2017-03-10: qty 1

## 2017-03-10 NOTE — ED Triage Notes (Signed)
Per EMS pt c/o center abdominal pain since awaken this am. States was at [facility] but didn't stay d/t wait time; no distress noted, LNBM was yesterday

## 2017-03-10 NOTE — ED Provider Notes (Signed)
[facility] Provider Note   CSN: [id number] Arrival date & time: 03/10/17  1923     History   Chief Complaint Chief Complaint  Patient presents with  . Abdominal Pain    HPI [name] [name] is a 51 y.o. female.  HPI Patient reports she developed fairly acute onset of lower, mid abdominal pain at 4 AM this morning. She reports it was gradual in onset. She reports a continued to get much worse over the course of the day until by this evening was severe. It is deep, cramping and aching in nature. She reports normal bowel movement yesterday. No pain burning or urgency with urination. Some nausea but no vomiting. She reports she has had hysterectomy but has one remaining ovary. She has had cholecystectomy. Patient still has appendix. Past Medical History:  Diagnosis Date  . Diabetes mellitus   . Family history of adverse reaction to anesthesia    " my uncle's heart stoped "  . GERD (gastroesophageal reflux disease)   . Heart murmur   . Hypertension   . 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 Mitral regurgitation   . Neuropathy in diabetes ([place])   . Syncope and collapse 07/28/2015    Patient Active Problem List   Diagnosis Date Noted  . 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 Diabetes mellitus with complication ([place])   . Intractable headache   . Syncope 07/28/2015  . Chronic headaches 07/28/2015  . Chest pain 05/22/2015  . Urinary frequency 03/13/2015  . Easy bruising 10/09/2014  . Encounter for preventative adult health care exam with abnormal findings 01/11/2014  . Chronic constipation 01/11/2014  . GERD (gastroesophageal reflux disease) 01/11/2014  . Helicobacter positive gastritis 12/06/2013  . Dizziness and giddiness 10/12/2013  . Essential hypertension 09/19/2013  . Back pain 06/11/2013  . Lumbosacral spondylosis without myelopathy 02/20/2013  . Diabetic neuropathy ([place]) 01/09/2013  . DM (diabetes mellitus), type 2, uncontrolled ([place]) 10/02/2012  .  MITRAL REGURGITATION, mild 10/31/2009  . PLEURAL EFFUSION, RIGHT after pyelonephritis in April 2011 (no intervention) 10/20/2009    Past Surgical History:  Procedure Laterality Date  . ABDOMINAL HYSTERECTOMY    . CATARACT EXTRACTION    . CESAREAN SECTION    . CHOLECYSTECTOMY    . EYE SURGERY Left retinal  . GANGLION CYST EXCISION    . TONSILLECTOMY    . TUBAL LIGATION      OB History    No data available       Home Medications    Prior to Admission medications   Medication Sig Start Date End Date Taking? Authorizing Provider  atorvastatin (LIPITOR) 20 MG tablet Take 1 tablet (20 mg total) by mouth daily. 11/21/15  Yes Biagio Borg, MD  Insulin Glargine (TOUJEO SOLOSTAR) 300 UNIT/ML SOPN Inject 50 Units into the skin at bedtime. 11/21/15  Yes Biagio Borg, MD  pregabalin (LYRICA) 150 MG capsule Take 1 capsule (150 mg total) by mouth 2 (two) times daily. 11/19/15  Yes Biagio Borg, MD  amitriptyline (ELAVIL) 25 MG tablet 1/2 pill each bedtime x 1 week, then 1 pill nightly x 1 week, then 1 1/2 pills nightly x 1 week, then 2 pills nightly thereafter. 01/22/16   Star Age, MD  amLODipine (NORVASC) 5 MG tablet Take 1 tablet (5 mg total) by mouth daily. 05/03/15   Burchette, Alinda Sierras, MD  Cyanocobalamin (VITAMIN B-12 PO) Take 1 tablet by mouth daily.    [provider]  dicyclomine (  BENTYL) 20 MG tablet Take 1 tablet (20 mg total) by mouth 2 (two) times daily. 03/11/17   Charlesetta Shanks, MD  furosemide (LASIX) 20 MG tablet Take 1 tablet (20 mg total) by mouth daily. 07/30/15   Mikhail, Velta Addison, DO  HUMULIN R U-500 KWIKPEN 500 UNIT/ML [employer] INJECT 18 UNITS UNDER THE SKIN EVERY MORNING AND 14 UNITS EVERY EVENING 12/24/15   Biagio Borg, MD  insulin regular human CONCENTRATED (HUMULIN R U-500 KWIKPEN) 500 UNIT/ML kwikpen Inject 14-18 Units into the skin 2 (two) times daily with a meal. 18 units in the morning and 14 units in the evening    [provider]  losartan  (COZAAR) 25 MG tablet Take 1 tablet (25 mg total) by mouth daily. 07/30/15   Mikhail, Velta Addison, DO  potassium chloride (K-DUR) 10 MEQ tablet Take 1 tablet (10 mEq total) by mouth daily. 07/30/15   Mikhail, Velta Addison, DO  traMADol (ULTRAM) 50 MG tablet Take 2 tablets (100 mg total) by mouth every 6 (six) hours as needed. 03/11/17   Charlesetta Shanks, MD    Family History Family History  Problem Relation Age of Onset  . Diabetes Father   . Heart attack Father   . Aneurysm Mother     Social History Social History  Substance Use Topics  . Smoking status: Never Smoker  . Smokeless tobacco: Never Used  . Alcohol use No     Allergies   Tylenol [acetaminophen]   Review of Systems Review of Systems 10 Systems reviewed and are negative for acute change except as noted in the HPI.  Physical Exam Updated Vital Signs BP (!) 156/92 (BP Location: Left Arm)   Pulse 62   Temp 97.9 F (36.6 C) (Oral)   Resp (!) 24 Comment: pt in severe pain at this time  SpO2 98%   Physical Exam  Constitutional: She is oriented to person, place, and time. She appears well-developed and well-nourished. No distress.  HENT:  Head: Normocephalic and atraumatic.  Mouth/Throat: Oropharynx is clear and moist.  Eyes: Conjunctivae and EOM are normal.  Neck: Neck supple.  Cardiovascular: Normal rate and regular rhythm.   No murmur heard. Pulmonary/Chest: Effort normal and breath sounds normal. No respiratory distress.  Abdominal: Soft. She exhibits no distension. There is tenderness.  Moderate central lower abdominal pain to palpation. No guarding.  Musculoskeletal: Normal range of motion. She exhibits no edema or tenderness.  Neurological: She is alert and oriented to person, place, and time. No cranial nerve deficit. She exhibits normal muscle tone. Coordination normal.  Skin: Skin is warm and dry.  Psychiatric: She has a normal mood and affect.  Nursing note and vitals reviewed.    ED Treatments / Results    Labs (all labs ordered are listed, but only abnormal results are displayed) Labs Reviewed  URINALYSIS, ROUTINE W REFLEX MICROSCOPIC - Abnormal; Notable for the following:       Result Value   Glucose, UA >=500 (*)    Hgb urine dipstick TRACE (*)    Protein, ur >300 (*)    All other components within normal limits  URINALYSIS, MICROSCOPIC (REFLEX) - Abnormal; Notable for the following:    Bacteria, UA MANY (*)    Squamous Epithelial / LPF 6-30 (*)    All other components within normal limits  CBC WITH DIFFERENTIAL/PLATELET - Abnormal; Notable for the following:    Hemoglobin 11.1 (*)    HCT 33.5 (*)    All other components within normal limits  BASIC  METABOLIC PANEL - Abnormal; Notable for the following:    Chloride 98 (*)    Glucose, Bld 341 (*)    BUN 30 (*)    Creatinine, Ser 1.05 (*)    Calcium 8.6 (*)    All other components within normal limits    EKG  EKG Interpretation None       Radiology Ct Abdomen Pelvis W Contrast  Result Date: 03/10/2017 CLINICAL DATA:  Central abdomen pain with some nausea EXAM: CT ABDOMEN AND PELVIS WITH CONTRAST TECHNIQUE: Multidetector CT imaging of the abdomen and pelvis was performed using the standard protocol following bolus administration of intravenous contrast. CONTRAST:  148mL ISOVUE-300 IOPAMIDOL (ISOVUE-300) INJECTION 61% COMPARISON:  10/12/2016 FINDINGS: Lower chest: Lung bases demonstrate linear scarring at the left base. No acute consolidation or pleural effusion. Normal heart size Hepatobiliary: No focal liver abnormality is seen. Status post cholecystectomy. No biliary dilatation. Pancreas: Unremarkable. No pancreatic ductal dilatation or surrounding inflammatory changes. Spleen: Normal in size without focal abnormality. Adrenals/Urinary Tract: No adrenal hemorrhage or renal injury identified. Bladder is unremarkable. Stomach/Bowel: Stomach is within normal limits. Appendix appears normal. No evidence of bowel wall thickening,  distention, or inflammatory changes. Vascular/Lymphatic: Aortic atherosclerosis. No enlarged abdominal or pelvic lymph nodes. Reproductive: Status post hysterectomy. No adnexal masses. Other: Negative for free air or free fluid. Moderate fat in the umbilicus Musculoskeletal: No acute or significant osseous findings. IMPRESSION: No CT evidence for acute intra-abdominal or pelvic abnormality Electronically Signed   By: Donavan Foil M.D.   On: 03/10/2017 23:22    Procedures Procedures (including critical care time)  Medications Ordered in ED Medications  sodium chloride 0.9 % bolus 1,000 mL (0 mLs Intravenous Stopped 03/11/17 0009)    Followed by  0.9 %  sodium chloride infusion (not administered)  morphine 4 MG/ML injection 4 mg (4 mg Intravenous Given 03/10/17 2226)  ondansetron (ZOFRAN) injection 4 mg (4 mg Intravenous Given 03/10/17 2226)  iopamidol (ISOVUE-300) 61 % injection 100 mL (100 mLs Intravenous Contrast Given 03/10/17 2256)  capsicum (ZOSTRIX) 2.831 % cream (1 application Topical Given 03/10/17 2324)  dicyclomine (BENTYL) capsule 10 mg (10 mg Oral Given 03/11/17 0007)  HYDROmorphone (DILAUDID) injection 1 mg (1 mg Intravenous Given 03/11/17 0007)     Initial Impression / Assessment and Plan / ED Course  I have reviewed the triage vital signs and the nursing notes.  Pertinent labs & imaging results that were available during my care of the patient were reviewed by me and considered in my medical decision making (see chart for details).     Recheck 12:10 discharge indicating improvement of pain. Nontoxic alert. No vomiting.  Final Clinical Impressions(s) / ED Diagnoses   Final diagnoses:  Lower abdominal pain   Patient had onset of lower abdominal pain over the past greater than 12 hours. CT does not show any acute findings. Patient has had hysterectomy and cholecystectomy. Urinalysis is not positive for UTI. At this time, plan will be for pain control and close outpatient follow-up.  Patient is counseled on signs and symptoms first return. New Prescriptions New Prescriptions   DICYCLOMINE (BENTYL) 20 MG TABLET    Take 1 tablet (20 mg total) by mouth 2 (two) times daily.   TRAMADOL (ULTRAM) 50 MG TABLET    Take 2 tablets (100 mg total) by mouth every 6 (six) hours as needed.     Charlesetta Shanks, MD 03/11/17 [phone]

## 2017-03-10 NOTE — ED Notes (Signed)
Patient transported to CT 

## 2017-03-11 DIAGNOSIS — R1030 Lower abdominal pain, unspecified: Secondary | ICD-10-CM | POA: Diagnosis not present

## 2017-03-11 MED ORDER — DICYCLOMINE HCL 20 MG PO TABS: 20.0000 mg | ORAL_TABLET | Freq: Two times a day (BID) | ORAL | 0 refills | Status: DC

## 2017-03-11 MED ORDER — TRAMADOL HCL 50 MG PO TABS: 100.0000 mg | ORAL_TABLET | Freq: Four times a day (QID) | ORAL | 0 refills | Status: DC | PRN

## 2017-03-11 MED ORDER — TRAMADOL HCL 50 MG PO TABS: 50.0000 mg | ORAL_TABLET | Freq: Once | ORAL | Status: DC

## 2017-03-11 NOTE — ED Notes (Signed)
Pt scooted up in bed and sat upright. Pt easily awakens to voice and follows commands.

## 2017-03-11 NOTE — ED Notes (Signed)
Pt's oxygen sat intermittently drop to 88% and returned to 95% when pt is awakened.

## 2017-03-11 NOTE — ED Notes (Signed)
Oxygen removed and pt placed on RA. Will monitor oxygen saturations.

## 2017-03-12 LAB — URINE CULTURE

## 2017-03-21 ENCOUNTER — Encounter (HOSPITAL_COMMUNITY): Payer: Self-pay | Admitting: *Deleted

## 2017-03-21 ENCOUNTER — Emergency Department (HOSPITAL_COMMUNITY)
Admission: EM | Admit: 2017-03-21 | Discharge: 2017-03-22 | Disposition: A | Discharge status: Home / Self Care | Payer: Medicare Other | Attending: Emergency Medicine | Admitting: Emergency Medicine

## 2017-03-21 ENCOUNTER — Emergency Department (HOSPITAL_COMMUNITY): Payer: Medicare Other

## 2017-03-21 DIAGNOSIS — E119 Type 2 diabetes mellitus without complications: Secondary | ICD-10-CM | POA: Diagnosis not present

## 2017-03-21 DIAGNOSIS — Z9049 Acquired absence of other specified parts of digestive tract: Secondary | ICD-10-CM | POA: Diagnosis not present

## 2017-03-21 DIAGNOSIS — R1032 Left lower quadrant pain: Secondary | ICD-10-CM | POA: Diagnosis present

## 2017-03-21 DIAGNOSIS — N390 Urinary tract infection, site not specified: Secondary | ICD-10-CM | POA: Diagnosis not present

## 2017-03-21 DIAGNOSIS — I10 Essential (primary) hypertension: Secondary | ICD-10-CM | POA: Diagnosis not present

## 2017-03-21 LAB — URINALYSIS, ROUTINE W REFLEX MICROSCOPIC
Bilirubin Urine: NEGATIVE
Glucose, UA: >=500 mg/dL — AB
Ketones, ur: NEGATIVE mg/dL
Nitrite: NEGATIVE
Protein, ur: 100 mg/dL — AB
Specific Gravity, Urine: 1.022 (ref 1.005–1.030)
pH: 5 (ref 5.0–8.0)

## 2017-03-21 LAB — CBC
HCT: 33.8 % — ABNORMAL LOW (ref 36.0–46.0)
Hemoglobin: 11.5 g/dL — ABNORMAL LOW (ref 12.0–15.0)
MCH: 26.7 pg (ref 26.0–34.0)
MCHC: 34 g/dL (ref 30.0–36.0)
MCV: 78.4 fL (ref 78.0–100.0)
Platelets: 157 10*3/uL (ref 150–400)
RBC: 4.31 MIL/uL (ref 3.87–5.11)
RDW: 13.4 % (ref 11.5–15.5)
WBC: 6.2 10*3/uL (ref 4.0–10.5)

## 2017-03-21 LAB — COMPREHENSIVE METABOLIC PANEL
ALT: 21 U/L (ref 14–54)
AST: 22 U/L (ref 15–41)
Albumin: 3.3 g/dL — ABNORMAL LOW (ref 3.5–5.0)
Alkaline Phosphatase: 124 U/L (ref 38–126)
Anion gap: 10 (ref 5–15)
BUN: 29 mg/dL — ABNORMAL HIGH (ref 6–20)
CO2: 27 mmol/L (ref 22–32)
Calcium: 8.9 mg/dL (ref 8.9–10.3)
Chloride: 96 mmol/L — ABNORMAL LOW (ref 101–111)
Creatinine, Ser: 1.38 mg/dL — ABNORMAL HIGH (ref 0.44–1.00)
GFR calc Af Amer: 51 mL/min — ABNORMAL LOW (ref >60)
GFR calc non Af Amer: 44 mL/min — ABNORMAL LOW (ref >60)
Glucose, Bld: 355 mg/dL — ABNORMAL HIGH (ref 65–99)
Potassium: 4 mmol/L (ref 3.5–5.1)
Sodium: 133 mmol/L — ABNORMAL LOW (ref 135–145)
Total Bilirubin: 0.6 mg/dL (ref 0.3–1.2)
Total Protein: 6.5 g/dL (ref 6.5–8.1)

## 2017-03-21 LAB — LIPASE, BLOOD: Lipase: 30 U/L (ref 11–51)

## 2017-03-21 MED ORDER — SODIUM CHLORIDE 0.9 % IV BOLUS (SEPSIS)
1000.0000 mL | Freq: Once | INTRAVENOUS | Status: AC
  Administered 2017-03-21: 1000 mL via INTRAVENOUS

## 2017-03-21 MED ORDER — ONDANSETRON HCL 8 MG PO TABS: 8.0000 mg | ORAL_TABLET | Freq: Four times a day (QID) | ORAL | 0 refills | Status: DC | PRN

## 2017-03-21 MED ORDER — HYDROMORPHONE HCL 1 MG/ML IJ SOLN
0.5000 mg | Freq: Once | INTRAMUSCULAR | Status: AC
  Administered 2017-03-21: 0.5 mg via INTRAVENOUS
  Filled 2017-03-21: qty 1

## 2017-03-21 MED ORDER — ONDANSETRON HCL 4 MG/2ML IJ SOLN
4.0000 mg | Freq: Once | INTRAMUSCULAR | Status: AC
  Administered 2017-03-21: 4 mg via INTRAVENOUS
  Filled 2017-03-21: qty 2

## 2017-03-21 MED ORDER — IOPAMIDOL (ISOVUE-300) INJECTION 61%
INTRAVENOUS | Status: AC
  Administered 2017-03-21: 100 mL via INTRAVENOUS
  Filled 2017-03-21: qty 100

## 2017-03-21 MED ORDER — OXYCODONE HCL 5 MG PO TABS: 5.0000 mg | ORAL_TABLET | Freq: Four times a day (QID) | ORAL | 0 refills | Status: DC | PRN

## 2017-03-21 MED ORDER — CEPHALEXIN 500 MG PO CAPS: 500.0000 mg | ORAL_CAPSULE | Freq: Four times a day (QID) | ORAL | 0 refills | Status: AC

## 2017-03-21 MED ORDER — DEXTROSE 5 % IV SOLN
1.0000 g | Freq: Once | INTRAVENOUS | Status: AC
  Administered 2017-03-21: 1 g via INTRAVENOUS
  Filled 2017-03-21: qty 10

## 2017-03-21 NOTE — ED Provider Notes (Signed)
Emergency Department Provider Note   I have reviewed the triage vital signs and the nursing notes.   HISTORY  Chief Complaint Abdominal Pain   HPI [name] [name] is a 51 y.o. female with a history of diabetes, hypertension, heart murmur, and syncope presents emergency department today with persistent suprapubic and left flank, CVA pain. Patient states this been going on for a few weeks of progressively been getting worse. She was seen in [facility] ER couple weeks ago with the over-the-counter medications that seemed to improve. She's had worsening polyuria since that time but no change in color or sensation of her urination. States no history of kidney stones that she knows of. Nothing makes it better or worse. She has had chills and nausea and vomiting along with night sweats.   Past Medical History:  Diagnosis Date  . Diabetes mellitus   . Family history of adverse reaction to anesthesia    " my uncle's heart stoped "  . GERD (gastroesophageal reflux disease)   . Heart murmur   . Hypertension   . 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 Mitral regurgitation   . Neuropathy in diabetes ([place])   . Syncope and collapse 07/28/2015    Patient Active Problem List   Diagnosis Date Noted  . 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 Diabetes mellitus with complication ([place])   . Intractable headache   . Syncope 07/28/2015  . Chronic headaches 07/28/2015  . Chest pain 05/22/2015  . Urinary frequency 03/13/2015  . Easy bruising 10/09/2014  . Encounter for preventative adult health care exam with abnormal findings 01/11/2014  . Chronic constipation 01/11/2014  . GERD (gastroesophageal reflux disease) 01/11/2014  . Helicobacter positive gastritis 12/06/2013  . Dizziness and giddiness 10/12/2013  . Essential hypertension 09/19/2013  . Back pain 06/11/2013  . Lumbosacral spondylosis without myelopathy 02/20/2013  . Diabetic neuropathy ([place]) 01/09/2013  . DM (diabetes mellitus),  type 2, uncontrolled ([place]) 10/02/2012  . MITRAL REGURGITATION, mild 10/31/2009  . PLEURAL EFFUSION, RIGHT after pyelonephritis in April 2011 (no intervention) 10/20/2009    Past Surgical History:  Procedure Laterality Date  . ABDOMINAL HYSTERECTOMY    . CATARACT EXTRACTION    . CESAREAN SECTION    . CHOLECYSTECTOMY    . EYE SURGERY Left retinal  . GANGLION CYST EXCISION    . TONSILLECTOMY    . TUBAL LIGATION      Current Outpatient Rx  . Order #: [id number] Class: Normal  . Order #: [id number] Class: Normal  . Order #: [id number] Class: Normal  . Order #: [id number] Class: Print  . Order #: [id number] Class: Historical Med  . Order #: [id number] Class: Print  . Order #: [id number] Class: Normal  . Order #: [id number] Class: Normal  . Order #: [id number] Class: Normal  . Order #: [id number] Class: Historical Med  . Order #: [id number] Class: Normal  . Order #: [id number] Class: Print  . Order #: [id number] Class: Print  . Order #: [id number] Class: Normal  . Order #: [id number] Class: Print  . Order #: [id number] Class: Print    Allergies Tylenol [acetaminophen]  Family History  Problem Relation Age of Onset  . Diabetes Father   . Heart attack Father   . Aneurysm Mother     Social History Social History  Substance Use Topics  . Smoking status: Never Smoker  . Smokeless tobacco: Never Used  . Alcohol use No    Review of Systems  All other systems negative except as documented in the HPI. All pertinent positives and negatives as  reviewed in the HPI. ____________________________________________   PHYSICAL EXAM:  VITAL SIGNS: ED Triage Vitals [03/21/17 1702]  Enc Vitals Group     BP 127/81     Pulse Rate 84     Resp 18     Temp 98 F (36.7 C)     Temp Source Oral     SpO2 100 %     Weight      Height      Head Circumference      Peak Flow      Pain Score      Pain Loc      Pain Edu?      Excl. in [place]?     Constitutional: Alert and oriented. Well appearing and in no acute  distress. Eyes: Conjunctivae are normal. PERRL. EOMI. Head: Atraumatic. Nose: No congestion/rhinnorhea. Mouth/Throat: Mucous membranes are moist.  Oropharynx non-erythematous. Neck: No stridor.  No meningeal signs.   Cardiovascular: Normal rate, regular rhythm. Good peripheral circulation. Grossly normal heart sounds.   Respiratory: Normal respiratory effort.  No retractions. Lungs CTAB. Gastrointestinal: Soft and mild tenderness left suprapubic area. No distention.  Musculoskeletal: No lower extremity tenderness nor edema. No gross deformities of extremities. left CVA tenderness. Neurologic:  Normal speech and language. No gross focal neurologic deficits are appreciated.  Skin:  Skin is warm, dry and intact. No rash noted.   ____________________________________________   LABS (all labs ordered are listed, but only abnormal results are displayed)  Labs Reviewed  COMPREHENSIVE METABOLIC PANEL - Abnormal; Notable for the following:       Result Value   Sodium 133 (*)    Chloride 96 (*)    Glucose, Bld 355 (*)    BUN 29 (*)    Creatinine, Ser 1.38 (*)    Albumin 3.3 (*)    GFR calc non Af Amer 44 (*)    GFR calc Af Amer 51 (*)    All other components within normal limits  CBC - Abnormal; Notable for the following:    Hemoglobin 11.5 (*)    HCT 33.8 (*)    All other components within normal limits  URINALYSIS, ROUTINE W REFLEX MICROSCOPIC - Abnormal; Notable for the following:    APPearance CLOUDY (*)    Glucose, UA >=500 (*)    Hgb urine dipstick SMALL (*)    Protein, ur 100 (*)    Leukocytes, UA MODERATE (*)    Bacteria, UA RARE (*)    Squamous Epithelial / LPF 6-30 (*)    All other components within normal limits  URINE CULTURE  LIPASE, BLOOD   ____________________________________________  RADIOLOGY  Ct Abdomen Pelvis W Contrast  Result Date: 03/21/2017 CLINICAL DATA:  Severe lower back pain radiating to left lower abdomen and groin. Nausea and vomiting. Pain for 3  weeks. Urinary frequency. EXAM: CT ABDOMEN AND PELVIS WITH CONTRAST TECHNIQUE: Multidetector CT imaging of the abdomen and pelvis was performed using the standard protocol following bolus administration of intravenous contrast. CONTRAST:  148mL ISOVUE-300 IOPAMIDOL (ISOVUE-300) INJECTION 61% COMPARISON:  CT abdomen dated 03/10/2017. CT abdomen dated 10/12/2016. FINDINGS: Lower chest: No acute abnormality. Hepatobiliary: No focal liver abnormality is seen. Status post cholecystectomy. No biliary dilatation. Pancreas: Unremarkable. No pancreatic ductal dilatation or surrounding inflammatory changes. Spleen: Normal in size without focal abnormality. Adrenals/Urinary Tract: Adrenal glands appear normal. Mild chronic scarring of the right renal cortex. Kidneys otherwise unremarkable without mass, stone or hydronephrosis. No perinephric fluid. No ureteral or bladder calculi identified. Bladder appears normal,  partially decompressed. Stomach/Bowel: Bowel is normal in caliber. No bowel wall thickening or evidence of bowel wall inflammation. Stomach appears normal. Appendix is not well seen but there are no inflammatory changes about the cecum to suggest acute appendicitis. Vascular/Lymphatic: Aortic atherosclerosis. No enlarged abdominal or pelvic lymph nodes. Reproductive: Unremarkable. History of hysterectomy. No adnexal mass or free fluid. Other: No free fluid or abscess collection. No free intraperitoneal air. Musculoskeletal: No acute or significant osseous finding. Superficial soft tissues are unremarkable. IMPRESSION: 1. No acute findings within the abdomen or pelvis. No bowel obstruction or evidence of bowel wall inflammation. No renal or ureteral calculi. No evidence of pyelonephritis. No free fluid. No acute or suspicious osseous finding. 2. Aortic atherosclerosis. Electronically Signed   By: Franki Cabot M.D.   On: 03/21/2017 23:30     ____________________________________________   PROCEDURES  Procedure(s) performed:   Procedures   ____________________________________________   INITIAL IMPRESSION / ASSESSMENT AND PLAN / ED COURSE  Pertinent labs & imaging results that were available during my care of the patient were reviewed by me and considered in my medical decision making (see chart for details).  Suspect likely pyelonephritis versus kidney stone. Less likely to be renal infarct versus RCC. We will CT scan, labs, fluids, antibiotics, pain medications and reevaluation for likely discharge.  CT negative but suspect early pyelonephritis. Some nausea but overall much improved symptoms. Plan for discharge on abx/zofran/pain medication with pcp follow up.  ____________________________________________  FINAL CLINICAL IMPRESSION(S) / ED DIAGNOSES  Final diagnoses:  Urinary tract infection without hematuria, site unspecified     MEDICATIONS GIVEN DURING THIS VISIT:  Medications  sodium chloride 0.9 % bolus 1,000 mL (0 mLs Intravenous Stopped 03/21/17 2335)  HYDROmorphone (DILAUDID) injection 0.5 mg (0.5 mg Intravenous Given 03/21/17 2217)  ondansetron (ZOFRAN) injection 4 mg (4 mg Intravenous Given 03/21/17 2217)  cefTRIAXone (ROCEPHIN) 1 g in dextrose 5 % 50 mL IVPB (0 g Intravenous Stopped 03/21/17 2335)  iopamidol (ISOVUE-300) 61 % injection (100 mLs Intravenous Contrast Given 03/21/17 2307)  ondansetron (ZOFRAN) injection 4 mg (4 mg Intravenous Given 03/21/17 2345)     NEW OUTPATIENT MEDICATIONS STARTED DURING THIS VISIT:  Discharge Medication List as of 03/21/2017 11:37 PM    START taking these medications   Details  cephALEXin (KEFLEX) 500 MG capsule Take 1 capsule (500 mg total) by mouth 4 (four) times daily., Starting Mon 03/21/2017, Until Thu 03/31/2017, Print    ondansetron (ZOFRAN) 8 MG tablet Take 1 tablet (8 mg total) by mouth every 6 (six) hours as needed for nausea or vomiting., Starting Mon  03/21/2017, Print    oxyCODONE (ROXICODONE) 5 MG immediate release tablet Take 1 tablet (5 mg total) by mouth every 6 (six) hours as needed for severe pain., Starting Mon 03/21/2017, Print        Note:  This document was prepared using Dragon voice recognition software and may include unintentional dictation errors.   Merrily Pew, MD 03/22/17 1436

## 2017-03-21 NOTE — ED Triage Notes (Signed)
PT is having severe lower back pain that radiates around to left lower abdomen and groin.  Pt has had nausea and vomiting.  Pt states pain for 3 weeks. Urinary frequency. LMP: Hysterectomy.

## 2017-03-23 LAB — URINE CULTURE

## 2017-06-18 ENCOUNTER — Emergency Department (HOSPITAL_COMMUNITY): Payer: Medicare Other

## 2017-06-18 ENCOUNTER — Encounter (HOSPITAL_COMMUNITY): Payer: Self-pay

## 2017-06-18 ENCOUNTER — Other Ambulatory Visit: Payer: Self-pay

## 2017-06-18 ENCOUNTER — Emergency Department (HOSPITAL_COMMUNITY)
Admission: EM | Admit: 2017-06-18 | Discharge: 2017-06-18 | Disposition: A | Discharge status: Home / Self Care | Payer: Medicare Other | Attending: Emergency Medicine | Admitting: Emergency Medicine

## 2017-06-18 DIAGNOSIS — R072 Precordial pain: Secondary | ICD-10-CM | POA: Diagnosis present

## 2017-06-18 DIAGNOSIS — R110 Nausea: Secondary | ICD-10-CM | POA: Insufficient documentation

## 2017-06-18 DIAGNOSIS — Z79899 Other long term (current) drug therapy: Secondary | ICD-10-CM | POA: Diagnosis not present

## 2017-06-18 DIAGNOSIS — R1012 Left upper quadrant pain: Secondary | ICD-10-CM | POA: Diagnosis not present

## 2017-06-18 DIAGNOSIS — E1165 Type 2 diabetes mellitus with hyperglycemia: Secondary | ICD-10-CM | POA: Diagnosis not present

## 2017-06-18 DIAGNOSIS — Z794 Long term (current) use of insulin: Secondary | ICD-10-CM | POA: Insufficient documentation

## 2017-06-18 DIAGNOSIS — I10 Essential (primary) hypertension: Secondary | ICD-10-CM | POA: Insufficient documentation

## 2017-06-18 DIAGNOSIS — R739 Hyperglycemia, unspecified: Secondary | ICD-10-CM

## 2017-06-18 LAB — COMPREHENSIVE METABOLIC PANEL
ALT: 13 U/L — ABNORMAL LOW (ref 14–54)
AST: 14 U/L — ABNORMAL LOW (ref 15–41)
Albumin: 2.5 g/dL — ABNORMAL LOW (ref 3.5–5.0)
Alkaline Phosphatase: 108 U/L (ref 38–126)
Anion gap: 9 (ref 5–15)
BUN: 16 mg/dL (ref 6–20)
CO2: 25 mmol/L (ref 22–32)
Calcium: 7.9 mg/dL — ABNORMAL LOW (ref 8.9–10.3)
Chloride: 98 mmol/L — ABNORMAL LOW (ref 101–111)
Creatinine, Ser: 1.01 mg/dL — ABNORMAL HIGH (ref 0.44–1.00)
GFR calc Af Amer: >60 mL/min (ref >60)
GFR calc non Af Amer: >60 mL/min (ref >60)
Glucose, Bld: 483 mg/dL — ABNORMAL HIGH (ref 65–99)
Potassium: 3.6 mmol/L (ref 3.5–5.1)
Sodium: 132 mmol/L — ABNORMAL LOW (ref 135–145)
Total Bilirubin: 0.5 mg/dL (ref 0.3–1.2)
Total Protein: 5.4 g/dL — ABNORMAL LOW (ref 6.5–8.1)

## 2017-06-18 LAB — URINALYSIS, ROUTINE W REFLEX MICROSCOPIC
Bilirubin Urine: NEGATIVE
Glucose, UA: >=500 mg/dL — AB
Hgb urine dipstick: NEGATIVE
Ketones, ur: 5 mg/dL — AB
Leukocytes, UA: NEGATIVE
Nitrite: NEGATIVE
Protein, ur: >=300 mg/dL — AB
Specific Gravity, Urine: 1.023 (ref 1.005–1.030)
pH: 5 (ref 5.0–8.0)

## 2017-06-18 LAB — CBC
HCT: 35.9 % — ABNORMAL LOW (ref 36.0–46.0)
Hemoglobin: 11.9 g/dL — ABNORMAL LOW (ref 12.0–15.0)
MCH: 26.3 pg (ref 26.0–34.0)
MCHC: 33.1 g/dL (ref 30.0–36.0)
MCV: 79.2 fL (ref 78.0–100.0)
Platelets: 159 10*3/uL (ref 150–400)
RBC: 4.53 MIL/uL (ref 3.87–5.11)
RDW: 13.4 % (ref 11.5–15.5)
WBC: 4.9 10*3/uL (ref 4.0–10.5)

## 2017-06-18 LAB — CBG MONITORING, ED: Glucose-Capillary: 280 mg/dL — ABNORMAL HIGH (ref 65–99)

## 2017-06-18 LAB — D-DIMER, QUANTITATIVE: D-Dimer, Quant: 0.82 ug/mL-FEU — ABNORMAL HIGH (ref 0.00–0.50)

## 2017-06-18 LAB — LIPASE, BLOOD: Lipase: 20 U/L (ref 11–51)

## 2017-06-18 MED ORDER — IOPAMIDOL (ISOVUE-370) INJECTION 76%
INTRAVENOUS | Status: AC
  Administered 2017-06-18: 100 mL
  Filled 2017-06-18: qty 100

## 2017-06-18 MED ORDER — GI COCKTAIL ~~LOC~~
30.0000 mL | Freq: Once | ORAL | Status: AC
  Administered 2017-06-18: 30 mL via ORAL
  Filled 2017-06-18: qty 30

## 2017-06-18 MED ORDER — SODIUM CHLORIDE 0.9 % IV BOLUS (SEPSIS)
1000.0000 mL | Freq: Once | INTRAVENOUS | Status: AC
  Administered 2017-06-18: 1000 mL via INTRAVENOUS

## 2017-06-18 MED ORDER — KETOROLAC TROMETHAMINE 30 MG/ML IJ SOLN
15.0000 mg | Freq: Once | INTRAMUSCULAR | Status: AC
  Administered 2017-06-18: 15 mg via INTRAVENOUS
  Filled 2017-06-18: qty 1

## 2017-06-18 MED ORDER — NAPROXEN 500 MG PO TABS: 500.0000 mg | ORAL_TABLET | Freq: Two times a day (BID) | ORAL | 0 refills | Status: DC

## 2017-06-18 MED ORDER — OMEPRAZOLE 20 MG PO CPDR: 20.0000 mg | DELAYED_RELEASE_CAPSULE | Freq: Every day | ORAL | 0 refills | Status: DC

## 2017-06-18 MED ORDER — OXYCODONE HCL 5 MG PO TABS: 5.0000 mg | ORAL_TABLET | ORAL | 0 refills | Status: DC | PRN

## 2017-06-18 MED ORDER — ONDANSETRON HCL 4 MG/2ML IJ SOLN
4.0000 mg | Freq: Once | INTRAMUSCULAR | Status: AC
  Administered 2017-06-18: 4 mg via INTRAVENOUS
  Filled 2017-06-18: qty 2

## 2017-06-18 MED ORDER — HYDROMORPHONE HCL 1 MG/ML IJ SOLN
0.5000 mg | Freq: Once | INTRAMUSCULAR | Status: AC
  Administered 2017-06-18: 0.5 mg via INTRAVENOUS
  Filled 2017-06-18: qty 1

## 2017-06-18 NOTE — ED Triage Notes (Signed)
Patient complains of 1 week of LUQ pain. Has U/S scheduled on Monday for same. Comp,lains of nausea with the pain.

## 2017-06-18 NOTE — ED Provider Notes (Signed)
[facility] EMERGENCY DEPARTMENT Provider Note   CSN: [id number] Arrival date & time: 06/18/17  1357     History   Chief Complaint Chief Complaint  Patient presents with  . LUQ pain x 1 week    HPI [name] [name] is a 51 y.o. female.  Patient presents with almost 2 weeks of left upper quadrant abdominal pain and lower chest pain that she states is beneath her ribs, is sharp in nature.  Pain radiates around her side into her back.  She has associated nausea but no vomiting.  No generalized chest pain or shortness of breath.  No cough or difficulty breathing.  No hemoptysis.  No diarrhea or urinary symptoms.  Patient does not note the symptoms to be worse with food.  She has seen her primary care physician who has prescribed tramadol for pain but this is not helped.  She has an ultrasound of her abdomen scheduled to be done in 2 days.  Pain is constant.  Nothing makes symptoms better.      Past Medical History:  Diagnosis Date  . Diabetes mellitus   . Family history of adverse reaction to anesthesia    " my uncle's heart stoped "  . GERD (gastroesophageal reflux disease)   . Heart murmur   . Hypertension   . 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 Mitral regurgitation   . Neuropathy in diabetes ([place])   . Syncope and collapse 07/28/2015    Patient Active Problem List   Diagnosis Date Noted  . 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 Diabetes mellitus with complication ([place])   . Intractable headache   . Syncope 07/28/2015  . Chronic headaches 07/28/2015  . Chest pain 05/22/2015  . Urinary frequency 03/13/2015  . Easy bruising 10/09/2014  . Encounter for preventative adult health care exam with abnormal findings 01/11/2014  . Chronic constipation 01/11/2014  . GERD (gastroesophageal reflux disease) 01/11/2014  . Helicobacter positive gastritis 12/06/2013  . Dizziness and giddiness 10/12/2013  . Essential hypertension 09/19/2013  . Back pain  06/11/2013  . Lumbosacral spondylosis without myelopathy 02/20/2013  . Diabetic neuropathy ([place]) 01/09/2013  . DM (diabetes mellitus), type 2, uncontrolled ([place]) 10/02/2012  . MITRAL REGURGITATION, mild 10/31/2009  . PLEURAL EFFUSION, RIGHT after pyelonephritis in April 2011 (no intervention) 10/20/2009    Past Surgical History:  Procedure Laterality Date  . ABDOMINAL HYSTERECTOMY    . CATARACT EXTRACTION    . CESAREAN SECTION    . CHOLECYSTECTOMY    . EYE SURGERY Left retinal  . GANGLION CYST EXCISION    . TONSILLECTOMY    . TUBAL LIGATION      OB History    No data available       Home Medications    Prior to Admission medications   Medication Sig Start Date End Date Taking? Authorizing Provider  amitriptyline (ELAVIL) 25 MG tablet 1/2 pill each bedtime x 1 week, then 1 pill nightly x 1 week, then 1 1/2 pills nightly x 1 week, then 2 pills nightly thereafter. 01/22/16   Star Age, MD  amLODipine (NORVASC) 5 MG tablet Take 1 tablet (5 mg total) by mouth daily. 05/03/15   Burchette, Alinda Sierras, MD  atorvastatin (LIPITOR) 20 MG tablet Take 1 tablet (20 mg total) by mouth daily. 11/21/15   Biagio Borg, MD  Cyanocobalamin (VITAMIN B-12 PO) Take 1 tablet by mouth daily.    [provider]  dicyclomine (BENTYL) 20 MG tablet  Take 1 tablet (20 mg total) by mouth 2 (two) times daily. 03/11/17   Charlesetta Shanks, MD  furosemide (LASIX) 20 MG tablet Take 1 tablet (20 mg total) by mouth daily. 07/30/15   Mikhail, Velta Addison, DO  HUMULIN R U-500 KWIKPEN 500 UNIT/ML kwikpen INJECT 18 UNITS UNDER THE SKIN EVERY MORNING AND 14 UNITS EVERY EVENING 12/24/15   Biagio Borg, MD  Insulin Glargine (TOUJEO SOLOSTAR) 300 UNIT/ML SOPN Inject 50 Units into the skin at bedtime. 11/21/15   Biagio Borg, MD  insulin regular human CONCENTRATED (HUMULIN R U-500 KWIKPEN) 500 UNIT/ML kwikpen Inject 14-18 Units into the skin 2 (two) times daily with a meal. 18 units in the morning and 14 units in the evening     [provider]  losartan (COZAAR) 25 MG tablet Take 1 tablet (25 mg total) by mouth daily. 07/30/15   Mikhail, Velta Addison, DO  ondansetron (ZOFRAN) 8 MG tablet Take 1 tablet (8 mg total) by mouth every 6 (six) hours as needed for nausea or vomiting. 03/21/17   Mesner, Corene Cornea, MD  oxyCODONE (ROXICODONE) 5 MG immediate release tablet Take 1 tablet (5 mg total) by mouth every 6 (six) hours as needed for severe pain. 03/21/17   Mesner, Corene Cornea, MD  potassium chloride (K-DUR) 10 MEQ tablet Take 1 tablet (10 mEq total) by mouth daily. 07/30/15   Mikhail, Velta Addison, DO  pregabalin (LYRICA) 150 MG capsule Take 1 capsule (150 mg total) by mouth 2 (two) times daily. 11/19/15   Biagio Borg, MD  traMADol (ULTRAM) 50 MG tablet Take 2 tablets (100 mg total) by mouth every 6 (six) hours as needed. 03/11/17   Charlesetta Shanks, MD    Family History Family History  Problem Relation Age of Onset  . Diabetes Father   . Heart attack Father   . Aneurysm Mother     Social History Social History   Tobacco Use  . Smoking status: Never Smoker  . Smokeless tobacco: Never Used  Substance Use Topics  . Alcohol use: No    Alcohol/week: 0.0 oz  . Drug use: No     Allergies   Tylenol [acetaminophen]   Review of Systems Review of Systems  Constitutional: Negative for fever.  HENT: Negative for rhinorrhea and sore throat.   Eyes: Negative for redness.  Respiratory: Negative for cough and shortness of breath.   Cardiovascular: Positive for chest pain.  Gastrointestinal: Positive for abdominal pain and nausea. Negative for diarrhea and vomiting.  Genitourinary: Negative for dysuria.  Musculoskeletal: Negative for myalgias.  Skin: Negative for rash.  Neurological: Negative for headaches.     Physical Exam Updated Vital Signs BP (!) 159/89 (BP Location: Right Arm)   Pulse 76   Temp 97.6 F (36.4 C) (Oral)   Resp 16   Ht 5' (1.524 m)   Wt 72.6 kg (160 lb)   SpO2 97%   BMI 31.25 kg/m   Physical  Exam  Constitutional: She appears well-developed and well-nourished.  HENT:  Head: Normocephalic and atraumatic.  Eyes: Conjunctivae are normal. Right eye exhibits no discharge. Left eye exhibits no discharge.  Neck: Normal range of motion. Neck supple.  Cardiovascular: Normal rate, regular rhythm and normal heart sounds.  No murmur heard. Pulmonary/Chest: Effort normal and breath sounds normal. No stridor. No respiratory distress. She has no wheezes. She exhibits no tenderness.  Abdominal: Soft. There is tenderness. There is no rebound and no guarding.  Neurological: She is alert.  Skin: Skin is warm and dry.  Psychiatric: She has a normal mood and affect.  Nursing note and vitals reviewed.    ED Treatments / Results  Labs (all labs ordered are listed, but only abnormal results are displayed) Labs Reviewed  COMPREHENSIVE METABOLIC PANEL - Abnormal; Notable for the following components:      Result Value   Sodium 132 (*)    Chloride 98 (*)    Glucose, Bld 483 (*)    Creatinine, Ser 1.01 (*)    Calcium 7.9 (*)    Total Protein 5.4 (*)    Albumin 2.5 (*)    AST 14 (*)    ALT 13 (*)    All other components within normal limits  CBC - Abnormal; Notable for the following components:   Hemoglobin 11.9 (*)    HCT 35.9 (*)    All other components within normal limits  URINALYSIS, ROUTINE W REFLEX MICROSCOPIC - Abnormal; Notable for the following components:   APPearance HAZY (*)    Glucose, UA >=500 (*)    Ketones, ur 5 (*)    Protein, ur >=300 (*)    Bacteria, UA RARE (*)    Squamous Epithelial / LPF 0-5 (*)    All other components within normal limits  D-DIMER, QUANTITATIVE (NOT AT [facility]) - Abnormal; Notable for the following components:   D-Dimer, Quant 0.82 (*)    All other components within normal limits  CBG MONITORING, ED - Abnormal; Notable for the following components:   Glucose-Capillary 280 (*)    All other components within normal limits  LIPASE, BLOOD     EKG  EKG Interpretation  Date/Time:  Saturday June 18 2017 17:59:02 EST Ventricular Rate:  70 PR Interval:    QRS Duration: 101 QT Interval:  552 QTC Calculation: 596 R Axis:   48 Text Interpretation:  Sinus rhythm Low voltage, precordial leads Probable anteroseptal infarct, old U waves present Baseline wander in lead(s) I II aVR No significant change since last tracing Reconfirmed by Addison Lank [phone]) on 06/18/2017 6:50:18 PM       Radiology Ct Angio Chest Pe W And/or Wo Contrast  Result Date: 06/18/2017 CLINICAL DATA:  One week of left upper quadrant pain.  Nausea. EXAM: CT ANGIOGRAPHY CHEST WITH CONTRAST TECHNIQUE: Multidetector CT imaging of the chest was performed using the standard protocol during bolus administration of intravenous contrast. Multiplanar CT image reconstructions and MIPs were obtained to evaluate the vascular anatomy. CONTRAST:  175mL ISOVUE-370 IOPAMIDOL (ISOVUE-370) INJECTION 76% COMPARISON:  None. FINDINGS: Cardiovascular: The heart size is normal. No coronary artery calcifications are identified. The thoracic aorta is nonaneurysmal with no dissection or atherosclerotic change. No pulmonary emboli. Mediastinum/Nodes: Oil cysts are seen in the left breast of no acute significance. The thyroid and esophagus are normal. No adenopathy. No effusion. Lungs/Pleura: Central airways are normal. No pneumothorax. Scattered subsegmental atelectasis. No suspicious nodules focal masses, or infiltrates. Upper Abdomen: Previous cholecystectomy. No acute abnormalities seen in the upper abdomen. Musculoskeletal: No chest wall abnormality. No acute or significant osseous findings. Review of the MIP images confirms the above findings. IMPRESSION: 1. No pulmonary emboli.  No acute abnormalities are identified. Electronically Signed   By: Dorise Bullion III M.D   On: 06/18/2017 18:58   US Abdomen Complete  Result Date: 06/18/2017 CLINICAL DATA:  Left upper quadrant pain  for 2 weeks. Post cholecystectomy. EXAM: ABDOMEN ULTRASOUND COMPLETE COMPARISON:  Body CT 03/21/2017 FINDINGS: Gallbladder: Surgically absent. Common bile duct: Diameter: 4 mm Liver: Heterogeneous echotexture of the liver. Portal  vein is patent on color Doppler imaging with normal direction of blood flow towards the liver. IVC: No abnormality visualized. Pancreas: Visualized portion unremarkable. Spleen: Size and appearance within normal limits. Right Kidney: Length: 11.3 cm. Echogenicity within normal limits. No mass or hydronephrosis visualized. Left Kidney: Length: 10.9 cm. Echogenicity within normal limits. No mass or hydronephrosis visualized. Abdominal aorta: No aneurysm visualized. Other findings: None. IMPRESSION: Diffusely heterogeneous echogenicity of the liver, which may be seen with intrinsic liver disease. No other acute abnormalities. Electronically Signed   By: Fidela Salisbury M.D.   On: 06/18/2017 17:37    Procedures Procedures (including critical care time)  Medications Ordered in ED Medications  HYDROmorphone (DILAUDID) injection 0.5 mg (0.5 mg Intravenous Given 06/18/17 1654)  ondansetron (ZOFRAN) injection 4 mg (4 mg Intravenous Given 06/18/17 1654)  sodium chloride 0.9 % bolus 1,000 mL (0 mLs Intravenous Stopped 06/18/17 1755)  HYDROmorphone (DILAUDID) injection 0.5 mg (0.5 mg Intravenous Given 06/18/17 1809)  iopamidol (ISOVUE-370) 76 % injection (100 mLs  Contrast Given 06/18/17 1833)  gi cocktail (Maalox,Lidocaine,Donnatal) (30 mLs Oral Given 06/18/17 1914)  ketorolac (TORADOL) 30 MG/ML injection 15 mg (15 mg Intravenous Given 06/18/17 2007)     Initial Impression / Assessment and Plan / ED Course  I have reviewed the triage vital signs and the nursing notes.  Pertinent labs & imaging results that were available during my care of the patient were reviewed by me and considered in my medical decision making (see chart for details).     Patient seen and examined.   Blood sugars are elevated.  Will check EKG, ultrasound, d-dimer.  Patient is not PERC negative due to age.  Vital signs reviewed and are as follows: BP (!) 159/89 (BP Location: Right Arm)   Pulse 76   Temp 97.6 F (36.4 C) (Oral)   Resp 16   Ht 5' (1.524 m)   Wt 72.6 kg (160 lb)   SpO2 97%   BMI 31.25 kg/m   5:57 PM patient updated on ultrasound results.  D-dimer elevated.  Will proceed with CT angiography of the chest to rule out blood clot and to further evaluate the lungs. Additional pain medication ordered.   EKG shows prolonged QT -- unchanged from previous.   8:53 PM CT neg. Pt discussed with and seen by Dr. Leonette Monarch.   Pt is frustrated that she does not have an answer for why she is having pain. Discussed she will need f/u for further management. Will d/c with omeprazole, naproxen, and small amount of pain medication.   The patient was urged to return to the Emergency Department immediately with worsening of current symptoms, worsening abdominal pain, persistent vomiting, blood noted in stools, fever, or any other concerns. The patient verbalized understanding.   Patient was counseled to return with severe chest pain, especially if the pain is crushing or pressure-like and spreads to the arms, back, neck, or jaw, or if they have sweating, nausea, or shortness of breath with the pain. They were encouraged to call 911 with these symptoms.   They were also told to return if their chest pain gets worse and does not go away with rest, they have an attack of chest pain lasting longer than usual despite rest and treatment with the medications their caregiver has prescribed, if they wake from sleep with chest pain or shortness of breath, if they feel dizzy or faint, if they have chest pain not typical of their usual pain, or if they have any other  emergent concerns regarding their health.  The patient verbalized understanding and agreed.    Final Clinical Impressions(s) / ED Diagnoses    Final diagnoses:  Precordial pain  Left upper quadrant pain  Hyperglycemia   Patient with LUQ abd pain and lower L rib/chest wall pain. Abd labs/imaging are reassuring. D-dimer ordered given chest component -- this was positive, prompting CT angio which was unrevealing. No ischemic findings on EKG, pain constant for 2 weeks atypical for ACS. No pain with exertion, diaphoresis. Will treat PUD, MSK pain as these are most likely etiologies at this time. Encouraged PCP f/u for continued work-up and management.    ED Discharge Orders        Ordered    omeprazole (PRILOSEC) 20 MG capsule  Daily     06/18/17 2058    naproxen (NAPROSYN) 500 MG tablet  2 times daily     06/18/17 2058    oxyCODONE (ROXICODONE) 5 MG immediate release tablet  Every 4 hours PRN     06/18/17 2058       Carlisle Cater, PA-C 06/18/17 2106    Fatima Blank, MD 06/19/17 [phone]

## 2017-06-18 NOTE — ED Notes (Signed)
Patient transported to Ultrasound 

## 2017-06-18 NOTE — ED Notes (Signed)
Patient verbalizes understanding of discharge instructions. Opportunity for questioning and answers were provided. Pt upset about being discharged without knowing the exact cause of her pain. Pt understands to follow up with primary care and will obtain consult for specialist if needed.

## 2017-06-18 NOTE — Discharge Instructions (Signed)
Please read and follow all provided instructions.  Your diagnoses today include:  1. Precordial pain   2. Left upper quadrant pain   3. Hyperglycemia     Tests performed today include:  Blood counts and electrolytes  Blood tests to check liver and kidney function  Blood tests to check pancreas function  Urine test to look for infection and pregnancy (in women)  Ultrasound of your abdomen - no cause of pain  CT of chest - no blood clots or problems with the lungs  Vital signs. See below for your results today.   Medications prescribed:   Omeprazole (Prilosec) - stomach acid reducer  This medication can be found over-the-counter   Naproxen - anti-inflammatory pain medication  Do not exceed 500mg  naproxen every 12 hours, take with food  You have been prescribed an anti-inflammatory medication or NSAID. Take with food. Take smallest effective dose for the shortest duration needed for your pain. Stop taking if you experience stomach pain or vomiting.    Oxycodone - narcotic pain medication  DO NOT drive or perform any activities that require you to be awake and alert because this medicine can make you drowsy.   Take any prescribed medications only as directed.  Home care instructions:   Follow any educational materials contained in this packet.  Follow-up instructions: Please follow-up with your primary care provider in the next 3 days for further evaluation of your symptoms.    Return instructions:  SEEK IMMEDIATE MEDICAL ATTENTION IF:  The pain does not go away or becomes severe   A temperature above 101F develops   Repeated vomiting occurs (multiple episodes)   The pain becomes localized to portions of the abdomen. The right side could possibly be appendicitis. In an adult, the left lower portion of the abdomen could be colitis or diverticulitis.   Blood is being passed in stools or vomit (bright red or black tarry stools)   You develop chest pain,  difficulty breathing, dizziness or fainting, or become confused, poorly responsive, or inconsolable (young children)  If you have any other emergent concerns regarding your health  Additional Information: Abdominal (belly) pain can be caused by many things. Your caregiver performed an examination and possibly ordered blood/urine tests and imaging (CT scan, x-rays, ultrasound). Many cases can be observed and treated at home after initial evaluation in the emergency department. Even though you are being discharged home, abdominal pain can be unpredictable. Therefore, you need a repeated exam if your pain does not resolve, returns, or worsens. Most patients with abdominal pain don't have to be admitted to the hospital or have surgery, but serious problems like appendicitis and gallbladder attacks can start out as nonspecific pain. Many abdominal conditions cannot be diagnosed in one visit, so follow-up evaluations are very important.  Your vital signs today were: BP (!) 163/88    Pulse 66    Temp 97.6 F (36.4 C) (Oral)    Resp (!) 9    Ht 5' (1.524 m)    Wt 72.6 kg (160 lb)    SpO2 95%    BMI 31.25 kg/m  If your blood pressure (bp) was elevated above 135/85 this visit, please have this repeated by your doctor within one month. --------------

## 2017-06-18 NOTE — ED Notes (Signed)
ED Provider at bedside. 

## 2020-03-07 ENCOUNTER — Emergency Department (HOSPITAL_COMMUNITY): Payer: Medicare Other

## 2020-03-07 ENCOUNTER — Encounter (HOSPITAL_COMMUNITY): Payer: Self-pay | Admitting: *Deleted

## 2020-03-07 ENCOUNTER — Other Ambulatory Visit: Payer: Self-pay

## 2020-03-07 ENCOUNTER — Inpatient Hospital Stay (HOSPITAL_COMMUNITY)
Admission: EM | Admit: 2020-03-07 | Discharge: 2020-03-15 | DRG: 871 | Disposition: A | Discharge status: Skilled Nursing Facility | Payer: Medicare Other | Source: Skilled Nursing Facility | Attending: Internal Medicine | Admitting: Internal Medicine

## 2020-03-07 DIAGNOSIS — Z992 Dependence on renal dialysis: Secondary | ICD-10-CM | POA: Diagnosis not present

## 2020-03-07 DIAGNOSIS — Z794 Long term (current) use of insulin: Secondary | ICD-10-CM | POA: Diagnosis not present

## 2020-03-07 DIAGNOSIS — E860 Dehydration: Secondary | ICD-10-CM | POA: Diagnosis present

## 2020-03-07 DIAGNOSIS — IMO0002 Reserved for concepts with insufficient information to code with codable children: Secondary | ICD-10-CM | POA: Diagnosis present

## 2020-03-07 DIAGNOSIS — K219 Gastro-esophageal reflux disease without esophagitis: Secondary | ICD-10-CM | POA: Diagnosis present

## 2020-03-07 DIAGNOSIS — Z79899 Other long term (current) drug therapy: Secondary | ICD-10-CM | POA: Diagnosis not present

## 2020-03-07 DIAGNOSIS — I120 Hypertensive chronic kidney disease with stage 5 chronic kidney disease or end stage renal disease: Secondary | ICD-10-CM | POA: Diagnosis present

## 2020-03-07 DIAGNOSIS — E1122 Type 2 diabetes mellitus with diabetic chronic kidney disease: Secondary | ICD-10-CM | POA: Diagnosis present

## 2020-03-07 DIAGNOSIS — E11649 Type 2 diabetes mellitus with hypoglycemia without coma: Secondary | ICD-10-CM | POA: Diagnosis present

## 2020-03-07 DIAGNOSIS — G9341 Metabolic encephalopathy: Secondary | ICD-10-CM | POA: Diagnosis present

## 2020-03-07 DIAGNOSIS — A419 Sepsis, unspecified organism: Secondary | ICD-10-CM | POA: Diagnosis present

## 2020-03-07 DIAGNOSIS — Z7952 Long term (current) use of systemic steroids: Secondary | ICD-10-CM | POA: Diagnosis not present

## 2020-03-07 DIAGNOSIS — L8990 Pressure ulcer of unspecified site, unspecified stage: Secondary | ICD-10-CM | POA: Insufficient documentation

## 2020-03-07 DIAGNOSIS — E1143 Type 2 diabetes mellitus with diabetic autonomic (poly)neuropathy: Secondary | ICD-10-CM | POA: Diagnosis present

## 2020-03-07 DIAGNOSIS — K760 Fatty (change of) liver, not elsewhere classified: Secondary | ICD-10-CM | POA: Diagnosis present

## 2020-03-07 DIAGNOSIS — N3000 Acute cystitis without hematuria: Secondary | ICD-10-CM | POA: Diagnosis present

## 2020-03-07 DIAGNOSIS — E1140 Type 2 diabetes mellitus with diabetic neuropathy, unspecified: Secondary | ICD-10-CM | POA: Diagnosis present

## 2020-03-07 DIAGNOSIS — T68XXXA Hypothermia, initial encounter: Secondary | ICD-10-CM

## 2020-03-07 DIAGNOSIS — I10 Essential (primary) hypertension: Secondary | ICD-10-CM | POA: Diagnosis present

## 2020-03-07 DIAGNOSIS — E162 Hypoglycemia, unspecified: Secondary | ICD-10-CM

## 2020-03-07 DIAGNOSIS — L8991 Pressure ulcer of unspecified site, stage 1: Secondary | ICD-10-CM | POA: Diagnosis present

## 2020-03-07 DIAGNOSIS — N186 End stage renal disease: Secondary | ICD-10-CM

## 2020-03-07 DIAGNOSIS — A4181 Sepsis due to Enterococcus: Secondary | ICD-10-CM | POA: Diagnosis present

## 2020-03-07 DIAGNOSIS — E875 Hyperkalemia: Secondary | ICD-10-CM | POA: Diagnosis present

## 2020-03-07 DIAGNOSIS — Z20822 Contact with and (suspected) exposure to covid-19: Secondary | ICD-10-CM | POA: Diagnosis present

## 2020-03-07 DIAGNOSIS — R4182 Altered mental status, unspecified: Secondary | ICD-10-CM

## 2020-03-07 DIAGNOSIS — N3001 Acute cystitis with hematuria: Secondary | ICD-10-CM | POA: Diagnosis not present

## 2020-03-07 DIAGNOSIS — Z9049 Acquired absence of other specified parts of digestive tract: Secondary | ICD-10-CM

## 2020-03-07 DIAGNOSIS — E1165 Type 2 diabetes mellitus with hyperglycemia: Secondary | ICD-10-CM | POA: Diagnosis present

## 2020-03-07 DIAGNOSIS — E8770 Fluid overload, unspecified: Secondary | ICD-10-CM | POA: Diagnosis present

## 2020-03-07 DIAGNOSIS — E11319 Type 2 diabetes mellitus with unspecified diabetic retinopathy without macular edema: Secondary | ICD-10-CM | POA: Diagnosis present

## 2020-03-07 DIAGNOSIS — D631 Anemia in chronic kidney disease: Secondary | ICD-10-CM | POA: Diagnosis present

## 2020-03-07 DIAGNOSIS — R109 Unspecified abdominal pain: Secondary | ICD-10-CM

## 2020-03-07 DIAGNOSIS — Z90710 Acquired absence of both cervix and uterus: Secondary | ICD-10-CM

## 2020-03-07 HISTORY — DX: Noninfective gastroenteritis and colitis, unspecified: K52.9

## 2020-03-07 LAB — CBC WITH DIFFERENTIAL/PLATELET
Abs Immature Granulocytes: 0.03 K/uL (ref 0.00–0.07)
Basophils Absolute: 0.1 K/uL (ref 0.0–0.1)
Basophils Relative: 1 %
Eosinophils Absolute: 0.2 K/uL (ref 0.0–0.5)
Eosinophils Relative: 2 %
HCT: 44.5 % (ref 36.0–46.0)
Hemoglobin: 13.2 g/dL (ref 12.0–15.0)
Immature Granulocytes: 0 %
Lymphocytes Relative: 23 %
Lymphs Abs: 2.7 K/uL (ref 0.7–4.0)
MCH: 25.5 pg — ABNORMAL LOW (ref 26.0–34.0)
MCHC: 29.7 g/dL — ABNORMAL LOW (ref 30.0–36.0)
MCV: 85.9 fL (ref 80.0–100.0)
Monocytes Absolute: 0.7 K/uL (ref 0.1–1.0)
Monocytes Relative: 6 %
Neutro Abs: 7.9 K/uL — ABNORMAL HIGH (ref 1.7–7.7)
Neutrophils Relative %: 68 %
Platelets: 184 K/uL (ref 150–400)
RBC: 5.18 MIL/uL — ABNORMAL HIGH (ref 3.87–5.11)
RDW: 18.5 % — ABNORMAL HIGH (ref 11.5–15.5)
WBC: 11.6 K/uL — ABNORMAL HIGH (ref 4.0–10.5)
nRBC: 0 % (ref 0.0–0.2)

## 2020-03-07 LAB — COMPREHENSIVE METABOLIC PANEL WITH GFR
ALT: 27 U/L (ref 0–44)
AST: 26 U/L (ref 15–41)
Albumin: 3.1 g/dL — ABNORMAL LOW (ref 3.5–5.0)
Alkaline Phosphatase: 108 U/L (ref 38–126)
Anion gap: 15 (ref 5–15)
BUN: 63 mg/dL — ABNORMAL HIGH (ref 6–20)
CO2: 21 mmol/L — ABNORMAL LOW (ref 22–32)
Calcium: 8.7 mg/dL — ABNORMAL LOW (ref 8.9–10.3)
Chloride: 102 mmol/L (ref 98–111)
Creatinine, Ser: 6.65 mg/dL — ABNORMAL HIGH (ref 0.44–1.00)
GFR calc Af Amer: 8 mL/min — ABNORMAL LOW
GFR calc non Af Amer: 7 mL/min — ABNORMAL LOW
Glucose, Bld: 166 mg/dL — ABNORMAL HIGH (ref 70–99)
Potassium: 5.3 mmol/L — ABNORMAL HIGH (ref 3.5–5.1)
Sodium: 138 mmol/L (ref 135–145)
Total Bilirubin: 0.5 mg/dL (ref 0.3–1.2)
Total Protein: 6.7 g/dL (ref 6.5–8.1)

## 2020-03-07 LAB — CBC
HCT: 47.3 % — ABNORMAL HIGH (ref 36.0–46.0)
Hemoglobin: 14.1 g/dL (ref 12.0–15.0)
MCH: 25.9 pg — ABNORMAL LOW (ref 26.0–34.0)
MCHC: 29.8 g/dL — ABNORMAL LOW (ref 30.0–36.0)
MCV: 86.9 fL (ref 80.0–100.0)
Platelets: 182 10*3/uL (ref 150–400)
RBC: 5.44 MIL/uL — ABNORMAL HIGH (ref 3.87–5.11)
RDW: 18.9 % — ABNORMAL HIGH (ref 11.5–15.5)
WBC: 9.2 10*3/uL (ref 4.0–10.5)
nRBC: 0 % (ref 0.0–0.2)

## 2020-03-07 LAB — SARS CORONAVIRUS 2 BY RT PCR (HOSPITAL ORDER, PERFORMED IN ~~LOC~~ HOSPITAL LAB): SARS Coronavirus 2: NEGATIVE

## 2020-03-07 LAB — URINALYSIS, ROUTINE W REFLEX MICROSCOPIC
Bilirubin Urine: NEGATIVE
Glucose, UA: NEGATIVE mg/dL
Ketones, ur: NEGATIVE mg/dL
Nitrite: NEGATIVE
Protein, ur: >=300 mg/dL — AB
RBC / HPF: >50 RBC/hpf — ABNORMAL HIGH (ref 0–5)
Specific Gravity, Urine: 1.019 (ref 1.005–1.030)
WBC, UA: >50 WBC/hpf — ABNORMAL HIGH (ref 0–5)
pH: 7 (ref 5.0–8.0)

## 2020-03-07 LAB — I-STAT CHEM 8, ED
BUN: 57 mg/dL — ABNORMAL HIGH (ref 6–20)
Calcium, Ion: 1.04 mmol/L — ABNORMAL LOW (ref 1.15–1.40)
Chloride: 105 mmol/L (ref 98–111)
Creatinine, Ser: 6.7 mg/dL — ABNORMAL HIGH (ref 0.44–1.00)
Glucose, Bld: 159 mg/dL — ABNORMAL HIGH (ref 70–99)
HCT: 46 % (ref 36.0–46.0)
Hemoglobin: 15.6 g/dL — ABNORMAL HIGH (ref 12.0–15.0)
Potassium: 5 mmol/L (ref 3.5–5.1)
Sodium: 137 mmol/L (ref 135–145)
TCO2: 23 mmol/L (ref 22–32)

## 2020-03-07 LAB — LACTIC ACID, PLASMA
Lactic Acid, Venous: 0.9 mmol/L (ref 0.5–1.9)
Lactic Acid, Venous: 0.8 mmol/L (ref 0.5–1.9)

## 2020-03-07 LAB — CREATININE, SERUM
Creatinine, Ser: 6.57 mg/dL — ABNORMAL HIGH (ref 0.44–1.00)
GFR calc Af Amer: 8 mL/min — ABNORMAL LOW (ref >60)
GFR calc non Af Amer: 7 mL/min — ABNORMAL LOW (ref >60)

## 2020-03-07 LAB — TROPONIN I (HIGH SENSITIVITY)
Troponin I (High Sensitivity): 9 ng/L (ref <18)
Troponin I (High Sensitivity): 7 ng/L

## 2020-03-07 LAB — LIPASE, BLOOD: Lipase: 61 U/L — ABNORMAL HIGH (ref 11–51)

## 2020-03-07 LAB — CBG MONITORING, ED: Glucose-Capillary: 172 mg/dL — ABNORMAL HIGH (ref 70–99)

## 2020-03-07 LAB — HIV ANTIBODY (ROUTINE TESTING W REFLEX): HIV Screen 4th Generation wRfx: NONREACTIVE

## 2020-03-07 LAB — APTT: aPTT: 31 seconds (ref 24–36)

## 2020-03-07 LAB — T4, FREE: Free T4: 0.88 ng/dL (ref 0.61–1.12)

## 2020-03-07 LAB — AMMONIA: Ammonia: 18 umol/L (ref 9–35)

## 2020-03-07 LAB — TSH
TSH: 4.109 u[IU]/mL (ref 0.350–4.500)
TSH: 3.14 u[IU]/mL (ref 0.350–4.500)

## 2020-03-07 LAB — PROTIME-INR
INR: 1.1 (ref 0.8–1.2)
Prothrombin Time: 13.7 s (ref 11.4–15.2)

## 2020-03-07 LAB — MAGNESIUM: Magnesium: 3.1 mg/dL — ABNORMAL HIGH (ref 1.7–2.4)

## 2020-03-07 LAB — PHOSPHORUS: Phosphorus: 8.2 mg/dL — ABNORMAL HIGH (ref 2.5–4.6)

## 2020-03-07 MED ORDER — SODIUM CHLORIDE 0.9 % IV SOLN: 2.0000 g | INTRAVENOUS | Status: DC

## 2020-03-07 MED ORDER — SODIUM ZIRCONIUM CYCLOSILICATE 10 G PO PACK
10.0000 g | PACK | Freq: Once | ORAL | Status: AC
  Administered 2020-03-07: 10 g via ORAL
  Filled 2020-03-07: qty 1

## 2020-03-07 MED ORDER — VANCOMYCIN HCL IN DEXTROSE 1-5 GM/200ML-% IV SOLN: 1000.0000 mg | Freq: Once | INTRAVENOUS | Status: DC

## 2020-03-07 MED ORDER — HEPARIN SODIUM (PORCINE) 5000 UNIT/ML IJ SOLN
5000.0000 [IU] | Freq: Three times a day (TID) | INTRAMUSCULAR | Status: DC
  Administered 2020-03-07 – 2020-03-15 (×20): 5000 [IU] via SUBCUTANEOUS
  Filled 2020-03-07 (×21): qty 1

## 2020-03-07 MED ORDER — ONDANSETRON HCL 4 MG PO TABS
4.0000 mg | ORAL_TABLET | Freq: Four times a day (QID) | ORAL | Status: DC | PRN
  Administered 2020-03-12: 4 mg via ORAL
  Filled 2020-03-07: qty 1

## 2020-03-07 MED ORDER — GABAPENTIN 100 MG PO CAPS
200.0000 mg | ORAL_CAPSULE | Freq: Every day | ORAL | Status: DC
  Administered 2020-03-07 – 2020-03-12 (×3): 200 mg via ORAL
  Filled 2020-03-07 (×7): qty 2

## 2020-03-07 MED ORDER — SODIUM CHLORIDE 0.9 % IV SOLN
1.0000 g | INTRAVENOUS | Status: DC
  Administered 2020-03-09: 1 g via INTRAVENOUS
  Filled 2020-03-07 (×3): qty 1

## 2020-03-07 MED ORDER — SODIUM CHLORIDE 0.9 % IV SOLN
2.0000 g | Freq: Once | INTRAVENOUS | Status: AC
  Administered 2020-03-07: 2 g via INTRAVENOUS
  Filled 2020-03-07: qty 2

## 2020-03-07 MED ORDER — ONDANSETRON HCL 4 MG/2ML IJ SOLN
4.0000 mg | Freq: Four times a day (QID) | INTRAMUSCULAR | Status: DC | PRN
  Administered 2020-03-11 – 2020-03-12 (×3): 4 mg via INTRAVENOUS
  Filled 2020-03-07 (×3): qty 2

## 2020-03-07 MED ORDER — SODIUM CHLORIDE 0.9 % IV SOLN
1.0000 g | INTRAVENOUS | Status: DC
  Filled 2020-03-07: qty 1

## 2020-03-07 MED ORDER — VANCOMYCIN HCL 1500 MG/300ML IV SOLN
1500.0000 mg | Freq: Once | INTRAVENOUS | Status: AC
  Administered 2020-03-07: 1500 mg via INTRAVENOUS
  Filled 2020-03-07: qty 300

## 2020-03-07 MED ORDER — METRONIDAZOLE IN NACL 5-0.79 MG/ML-% IV SOLN
500.0000 mg | Freq: Once | INTRAVENOUS | Status: AC
  Administered 2020-03-07: 500 mg via INTRAVENOUS
  Filled 2020-03-07: qty 100

## 2020-03-07 MED ORDER — NALOXONE HCL 2 MG/2ML IJ SOSY
1.0000 mg | PREFILLED_SYRINGE | Freq: Once | INTRAMUSCULAR | Status: AC
  Administered 2020-03-07: 1 mg via INTRAVENOUS

## 2020-03-07 MED ORDER — VANCOMYCIN HCL 750 MG/150ML IV SOLN: 750.0000 mg | INTRAVENOUS | Status: DC

## 2020-03-07 MED ORDER — NALOXONE HCL 2 MG/2ML IJ SOSY
PREFILLED_SYRINGE | INTRAMUSCULAR | Status: AC
  Administered 2020-03-07: 1 mg via INTRAVENOUS
  Filled 2020-03-07: qty 2

## 2020-03-07 MED ORDER — NALOXONE HCL 2 MG/2ML IJ SOSY: 1.0000 mg | PREFILLED_SYRINGE | Freq: Once | INTRAMUSCULAR | Status: AC

## 2020-03-07 NOTE — ED Notes (Signed)
Bair Hugger removed.  Temp 99.5.

## 2020-03-07 NOTE — ED Notes (Signed)
Requested meal order from md.

## 2020-03-07 NOTE — ED Notes (Signed)
Pt speaking clearly to admitting MD

## 2020-03-07 NOTE — ED Triage Notes (Signed)
Pt here via  [facility] from [facility] for altered mental status and hypoglycemia.  Pt clearly unresponsive.  Initial cbg was 44 and improved to 198 after 1 amp dextrose given.   Initial bp was 88/45.  Pt was given 100 ml of ns and bp increased to 106/68.  Per staff, pt is normally ao x 4 and LSN was last night.

## 2020-03-07 NOTE — H&P (Signed)
History and Physical    [name] [name]:[id number] DOB: 1966-03-09 DOA: 03/07/2020  PCP: Biagio Borg, MD  Patient coming from: [facility] rehab  I have personally briefly reviewed patient's old medical records in [place]  Chief Complaint: AMS  HPI: [name] [name] is a 54 y.o. female with medical history significant of hypertension, diabetes, diabetic neuropathy, ESRD on hemodialysis,NAFLD, chronic bilateral lower extremity weakness, recurrent ESBL Klebsiella UTI, history of pubic symphysis septic arthritis and osteomyelitis and extraperitoneal left thigh abscess status post I & D of left groin on 11/26/2019 brought by EMS to the emergency department for evaluation of AMS.  Patient tells me that she went to [facility] ED on 9/1 for evaluation of UTI.  Patient was complaining of foul-smelling urine with purulent material/discharge when wiping.  She had CT abdomen shows ill-defined fluid collection along the inferior aspect of pubic symphysis which continues to extend along the left adductor musculature, rectal wall thickening and perirectal stranding, circumferential bladder wall thickening for which  IR was consulted who recommended supportive care and no drainable collection identified therefore no IR intervention indicated.  This morning at rehab patient was noted to be confused and unresponsive.  EMS was called-her initial CBG was noted to be 44 and improved to 190 after glucagon.  Blood pressure was noted to be 88/45 and was given 100 cc of normal saline and BP increased to 106/68.  Patient was brought to ER for further evaluation and management.  Upon my evaluation: Patient alert and communicating well.  She tells me that she is not sure why she is in the hospital.  Denies headache, blurry vision, chest pain, shortness of breath, palpitation, urinary changes, ulcers or sores on back.  She had large bowel movement during encounter.  No history of smoking, alcohol,  illicit drug use.  She has chronic bilateral lower extremity weakness for which she gets physical therapy at rehab.  She wears diapers however denies urinary or bowel incontinence.  ED Course: Upon arrival to ED: Patient temperature 93.7, hypotensive, hypoxic requiring 2 L of oxygen via nasal cannula, leukocytosis of 11.6, lactic acid: WNL, UA positive for leukocyte, RBC, WBC and bacteria, PT/INR, APTT, T4, TSH, ammonia level: WNL, lipase: 61, COVID-19 negative, troponin: Negative, CT head negative for acute findings CT abdomen/pelvis concerning for acute cystitis.  Chest x-ray negative for acute findings.  Patient received cefepime, vancomycin, Flagyl and Nalaxone in the ED.  Review of Systems: As per HPI otherwise negative.    Past Medical History:  Diagnosis Date  . Colitis   . Diabetes mellitus   . Family history of adverse reaction to anesthesia    " my uncle's heart stoped "  . GERD (gastroesophageal reflux disease)   . Heart murmur   . Hypertension   . 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 Mitral regurgitation   . Neuropathy in diabetes ([place])   . Syncope and collapse 07/28/2015    Past Surgical History:  Procedure Laterality Date  . ABDOMINAL HYSTERECTOMY    . CATARACT EXTRACTION    . CESAREAN SECTION    . CHOLECYSTECTOMY    . EYE SURGERY Left retinal  . GANGLION CYST EXCISION    . TONSILLECTOMY    . TUBAL LIGATION       reports that she has never smoked. She has never used smokeless tobacco. She reports that she does not drink alcohol and does not use drugs.  Allergies  Allergen Reactions  .  Amitriptyline Swelling    Other reaction(s): Confusion (intolerance)  . Ciprofloxacin Diarrhea and Nausea And Vomiting    VOMITING Severe vomiting requiring ED visit, IV reglan and IV fluids VOMITING   . Semaglutide Nausea Only  . Tramadol Itching  . Tylenol [Acetaminophen] Itching    Family History  Problem Relation Age of Onset  . Diabetes Father   . Heart attack  Father   . Aneurysm Mother     Prior to Admission medications   Medication Sig Start Date End Date Taking? Authorizing Provider  amLODipine (NORVASC) 10 MG tablet Take 10 mg by mouth daily. 02/25/20  Yes [provider]  Ascorbic Acid (VITAMIN C) 100 MG tablet Take 100 mg by mouth daily.   Yes [provider]  atropine 1 % ophthalmic solution Place 1 drop into the right eye 3 (three) times daily.   Yes [provider]  B Complex-C (B-COMPLEX WITH VITAMIN C) tablet Take 1 tablet by mouth daily.   Yes [provider]  bisacodyl (DULCOLAX) 10 MG suppository Place 10 mg rectally daily as needed for moderate constipation.   Yes [provider]  bisacodyl (DULCOLAX) 5 MG EC tablet Take 10 mg by mouth See admin instructions. 2 tabs (10mg ) every three days   Yes [provider]  carvedilol (COREG) 25 MG tablet Take 25 mg by mouth 2 (two) times daily. 03/04/20  Yes [provider]  ferrous sulfate 325 (65 FE) MG tablet Take 325 mg by mouth 2 (two) times daily with a meal.   Yes [provider]  gabapentin (NEURONTIN) 100 MG capsule Take 200 mg by mouth at bedtime.   Yes [provider]  gabapentin (NEURONTIN) 100 MG capsule Take 100 mg by mouth daily.   Yes [provider]  hydrALAZINE (APRESOLINE) 10 MG tablet Take 10 mg by mouth every 4 (four) hours as needed (BP if > 165/100).   Yes [provider]  Insulin Glargine (TOUJEO SOLOSTAR) 300 UNIT/ML SOPN Inject 50 Units into the skin at bedtime. Patient taking differently: Inject 13 Units into the skin daily.  11/21/15  Yes Biagio Borg, MD  insulin lispro (HUMALOG) 100 UNIT/ML injection Inject 2-14 Units into the skin daily. Per sliding scale: BG  201-250= 2 units, 251-300 = 4 units, 301-350= 6 units, 351-400= 8 units, 401-450= 10 units, 451-500 = 12 units, 500 or greater = 14 units.   Yes [provider]  isosorbide dinitrate (ISORDIL) 20 MG tablet  Take 20 mg by mouth 3 (three) times daily.   Yes [provider]  LACTOBACILLUS EXTRA STRENGTH PO Take 1 capsule by mouth daily.   Yes [provider]  Magnesium 400 MG TABS Take 400 mg by mouth 2 (two) times daily.   Yes [provider]  oxyCODONE (ROXICODONE) 5 MG immediate release tablet Take 1 tablet (5 mg total) by mouth every 4 (four) hours as needed for severe pain. Patient taking differently: Take 5 mg by mouth every 6 (six) hours as needed for severe pain.  06/18/17  Yes Carlisle Cater, PA-C  polyethylene glycol (MIRALAX / GLYCOLAX) 17 g packet Take 17 g by mouth daily.   Yes [provider]  prednisoLONE acetate (PRED FORTE) 1 % ophthalmic suspension Place 1 drop into the right eye 4 (four) times daily.   Yes [provider]  sennosides-docusate sodium (SENOKOT-S) 8.6-50 MG tablet Take 2 tablets by mouth 2 (two) times daily.   Yes [provider]  amitriptyline (ELAVIL)  25 MG tablet 1/2 pill each bedtime x 1 week, then 1 pill nightly x 1 week, then 1 1/2 pills nightly x 1 week, then 2 pills nightly thereafter. 01/22/16   Star Age, MD  amLODipine (NORVASC) 5 MG tablet Take 1 tablet (5 mg total) by mouth daily. 05/03/15   Burchette, Alinda Sierras, MD  atorvastatin (LIPITOR) 20 MG tablet Take 1 tablet (20 mg total) by mouth daily. 11/21/15   Biagio Borg, MD  Cyanocobalamin (VITAMIN B-12 PO) Take 1 tablet by mouth daily.    [provider]  dicyclomine (BENTYL) 20 MG tablet Take 1 tablet (20 mg total) by mouth 2 (two) times daily. 03/11/17   Charlesetta Shanks, MD  furosemide (LASIX) 20 MG tablet Take 1 tablet (20 mg total) by mouth daily. 07/30/15   Mikhail, Velta Addison, DO  HUMULIN R U-500 KWIKPEN 500 UNIT/ML [employer] INJECT 18 UNITS UNDER THE SKIN EVERY MORNING AND 14 UNITS EVERY EVENING 12/24/15   Biagio Borg, MD  insulin regular human CONCENTRATED (HUMULIN R U-500 KWIKPEN) 500 UNIT/ML kwikpen Inject 14-18 Units into the skin 2 (two) times  daily with a meal. 18 units in the morning and 14 units in the evening    [provider]  losartan (COZAAR) 25 MG tablet Take 1 tablet (25 mg total) by mouth daily. 07/30/15   Mikhail, Velta Addison, DO  naproxen (NAPROSYN) 500 MG tablet Take 1 tablet (500 mg total) by mouth 2 (two) times daily. 06/18/17   Carlisle Cater, PA-C  omeprazole (PRILOSEC) 20 MG capsule Take 1 capsule (20 mg total) by mouth daily. 06/18/17   Carlisle Cater, PA-C  ondansetron (ZOFRAN) 8 MG tablet Take 1 tablet (8 mg total) by mouth every 6 (six) hours as needed for nausea or vomiting. 03/21/17   Mesner, Corene Cornea, MD  potassium chloride (K-DUR) 10 MEQ tablet Take 1 tablet (10 mEq total) by mouth daily. 07/30/15   Mikhail, Velta Addison, DO  pregabalin (LYRICA) 150 MG capsule Take 1 capsule (150 mg total) by mouth 2 (two) times daily. 11/19/15   Biagio Borg, MD  traMADol (ULTRAM) 50 MG tablet Take 2 tablets (100 mg total) by mouth every 6 (six) hours as needed. 03/11/17   Charlesetta Shanks, MD    Physical Exam: Vitals:   03/07/20 1315 03/07/20 1330 03/07/20 1345 03/07/20 1347  BP:  111/64    Pulse: (!) 52 (!) 53 (!) 52   Resp: 11 12 11    Temp: (!) 93.1 F (33.9 C) (!) 93.4 F (34.1 C) (!) 93.7 F (34.3 C)   TempSrc:      SpO2: 100% 100% 100%   Weight:    72.6 kg  Height:    5' (1.524 m)    Constitutional: NAD, calm, comfortable Eyes: PERRL, lids and conjunctivae normal ENMT: Mucous membranes are moist. Posterior pharynx clear of any exudate or lesions.Normal dentition.  Neck: normal, supple, no masses, no thyromegaly Respiratory: clear to auscultation bilaterally, no wheezing, no crackles. Normal respiratory effort. No accessory muscle use.  Cardiovascular: Regular rate and rhythm, no murmurs / rubs / gallops. No extremity edema. 2+ pedal pulses. No carotid bruits.  Abdomen: no tenderness, no masses palpated. No hepatosplenomegaly. Bowel sounds positive.  Musculoskeletal: no clubbing / cyanosis. No joint deformity upper  and lower extremities. Good ROM, no contractures. Normal muscle tone.  Skin: no rashes, lesions, ulcers. No induration Neurologic: CN 2-12 grossly intact. Sensation intact, DTR normal.  Power 1 out of 5 in bilateral lower extremities and 5 out of  5 in bilateral upper extremities.   Psychiatric: Normal judgment and insight. Alert and oriented x 3. Normal mood.    Labs on Admission: I have personally reviewed following labs and imaging studies  CBC: Recent Labs  Lab 03/07/20 1128 03/07/20 1145  WBC 11.6*  --   NEUTROABS 7.9*  --   HGB 13.2 15.6*  HCT 44.5 46.0  MCV 85.9  --   PLT 184  --    Basic Metabolic Panel: Recent Labs  Lab 03/07/20 1128 03/07/20 1145  NA 138 137  K 5.3* 5.0  CL 102 105  CO2 21*  --   GLUCOSE 166* 159*  BUN 63* 57*  CREATININE 6.65* 6.70*  CALCIUM 8.7*  --    GFR: Estimated Creatinine Clearance: 8.6 mL/min (A) (by C-G formula based on SCr of 6.7 mg/dL (H)). Liver Function Tests: Recent Labs  Lab 03/07/20 1128  AST 26  ALT 27  ALKPHOS 108  BILITOT 0.5  PROT 6.7  ALBUMIN 3.1*   Recent Labs  Lab 03/07/20 1128  LIPASE 61*   Recent Labs  Lab 03/07/20 1128  AMMONIA 18   Coagulation Profile: Recent Labs  Lab 03/07/20 1128  INR 1.1   Cardiac Enzymes: No results for input(s): CKTOTAL, CKMB, CKMBINDEX, TROPONINI in the last 168 hours. BNP (last 3 results) No results for input(s): PROBNP in the last 8760 hours. HbA1C: No results for input(s): HGBA1C in the last 72 hours. CBG: Recent Labs  Lab 03/07/20 1041  GLUCAP 172*   Lipid Profile: No results for input(s): CHOL, HDL, LDLCALC, TRIG, CHOLHDL, LDLDIRECT in the last 72 hours. Thyroid Function Tests: Recent Labs    03/07/20 1128  TSH 4.109  FREET4 0.88   Anemia Panel: No results for input(s): VITAMINB12, FOLATE, FERRITIN, TIBC, IRON, RETICCTPCT in the last 72 hours. Urine analysis:    Component Value Date/Time   COLORURINE STRAW (A) 03/07/2020 1123   APPEARANCEUR  TURBID (A) 03/07/2020 1123   LABSPEC 1.019 03/07/2020 1123   PHURINE 7.0 03/07/2020 1123   GLUCOSEU NEGATIVE 03/07/2020 1123   GLUCOSEU >=1000 (A) 11/19/2015 1505   HGBUR SMALL (A) 03/07/2020 1123   BILIRUBINUR NEGATIVE 03/07/2020 1123   BILIRUBINUR neg 03/13/2015 1858   KETONESUR NEGATIVE 03/07/2020 1123   PROTEINUR >=300 (A) 03/07/2020 1123   UROBILINOGEN 0.2 11/19/2015 1505   NITRITE NEGATIVE 03/07/2020 1123   LEUKOCYTESUR MODERATE (A) 03/07/2020 1123    Radiological Exams on Admission: CT ABDOMEN PELVIS WO CONTRAST  Result Date: 03/07/2020 CLINICAL DATA:  Altered mental status, leukocytosis, elevated serum lipase EXAM: CT ABDOMEN AND PELVIS WITHOUT CONTRAST TECHNIQUE: Multidetector CT imaging of the abdomen and pelvis was performed following the standard protocol without IV contrast. COMPARISON:  CT 10/22/2019 FINDINGS: Lower chest: Mild bibasilar atelectasis. Heart size is mildly enlarged. No pericardial effusion. Hepatobiliary: No focal abnormality identified within the unenhanced liver. Status post cholecystectomy. No biliary dilatation. Pancreas: Grossly unremarkable without evidence of peripancreatic inflammatory changes or fluid. No ductal dilatation identified. Spleen: Normal in size without focal abnormality. Adrenals/Urinary Tract: Unremarkable adrenal glands. Kidneys appear within normal limits. No hydronephrosis. Urinary bladder is diffusely thick-walled with associated fat stranding. Foley catheter is in place. Stomach/Bowel: Stomach is within normal limits. Small bowel is fluid-filled without dilation or evidence of obstruction. Mild-moderate volume of stool throughout the colon. No evidence of bowel wall thickening, distention, or inflammatory changes. Vascular/Lymphatic: Scattered aortoiliac atherosclerotic calcifications without aneurysm. Duplicated IVC. No new or enlarging abdominopelvic lymph nodes. Reproductive: Status post hysterectomy. No adnexal masses. Other:  Previously  seen suprapubic fluid collection has resolved. No ascites. No new abdominopelvic fluid collection. No pneumoperitoneum. Small fat containing periumbilical hernia. Mild diffuse anasarca. Musculoskeletal: No acute or significant osseous findings. IMPRESSION: 1. Urinary bladder is diffusely thick-walled with associated fat stranding, suggestive of cystitis. Correlation with urinalysis is recommended. 2. Previously seen suprapubic fluid collection has resolved. No new abdominopelvic fluid collection or abscess. 3. No CT evidence of acute pancreatitis. 4. Mild-moderate volume of stool throughout the colon. No evidence of bowel obstruction. 5. Aortic atherosclerosis. (ICD10-I70.0). Electronically Signed   By: Davina Poke D.O.   On: 03/07/2020 12:52   CT Head Wo Contrast  Result Date: 03/07/2020 CLINICAL DATA:  Head trauma, altered mental status. EXAM: CT HEAD WITHOUT CONTRAST TECHNIQUE: Contiguous axial images were obtained from the base of the skull through the vertex without intravenous contrast. COMPARISON:  07/14/2019 FINDINGS: Brain: No evidence of acute infarction, hemorrhage, hydrocephalus, extra-axial collection or mass lesion/mass effect. Vascular: Calcific intracranial atherosclerosis. Skull: Normal. Negative for fracture or focal lesion. Sinuses/Orbits: No acute finding. Partially imaged right maxillary osteoma. Other: No mastoid effusion. IMPRESSION: No acute intracranial abnormality. Electronically Signed   By: Margaretha Sheffield MD   On: 03/07/2020 12:43   DG Chest Port 1 View  Result Date: 03/07/2020 CLINICAL DATA:  Sepsis EXAM: PORTABLE CHEST 1 VIEW COMPARISON:  10/18/2019 FINDINGS: Mild cardiomegaly. Central line tip at the proximal right atrium. Lungs are clear. Vascularity is normal. No effusions. No acute bone finding. IMPRESSION: No active disease. Mild cardiomegaly. Central line tip at the proximal right atrium. Electronically Signed   By: Nelson Chimes M.D.   On: 03/07/2020 11:30    EKG:  Independently reviewed.  Sinus rhythm, left axis deviation.  No ST elevation or depression noted.  Assessment/Plan Principal Problem:   Sepsis ([place]) Active Problems:   DM (diabetes mellitus), type 2, uncontrolled ([place])   Diabetic neuropathy ([place])   Essential hypertension   ESRD (end stage renal disease) on dialysis ([place])   Hyperkalemia   Acute cystitis   Acute metabolic encephalopathy    Sepsis secondary to UTI: -Patient has history of recurrent ESBL Klebsiella UTI.  UA is positive for leukocyte, RBC, WBC and bacteria.  CT abdomen/pelvis concerning for acute cystitis.  Patient hypotensive, hypothermic, has leukocytosis of 11.6.  On Retail [employer].  Lactic acid: WNL, COVID-19 negative.  -Received cefepime, vancomycin and Flagyl in the ED. -Discussed with pharmacy.  Considering history of recurrent ESBL UTI- will continue cefepime and follow urine culture result -Monitor vitals closely.  Acute metabolic encephalopathy: -Multifactorial -Hypoglycemia/hypothermia/sepsis due to UTI -CT head is negative for acute findings. -TSH, free T4, ammonia level: WNL, COVID-19 negative -Neurochecks. -On fall precautions.  ESRD on hemodialysis: -Patient appears euvolemic.  Had hemodialysis yesterday.  EDP consulted nephrology.  Hyperkalemia: Potassium of 5.3. -Reviewed EKG.  Will give a dose of Lokelma. -Check magnesium level.  Repeat BMP tomorrow a.m.  Type 2 diabetes mellitus: Patient hypoglycemic upon arrival -We will hold Lantus and regular insulin.  CBG every 4 hours. -Check A1c.  Hypertension: Patient hypotensive upon arrival -Hold home BP meds-Coreg, amlodipine, hydralazine -Monitor blood pressure closely.  Resume home BP meds once blood pressure is back to baseline.  Diabetic neuropathy: Continue gabapentin  Chronic bilateral lower extremity weakness: -No nerve response in legs on EMG on 10/2018. -Consult PT/OT  DVT prophylaxis: Heparin/SCD Code Status: Full code Family  Communication: None present at bedside.  Plan of care discussed with patient in length and she verbalized understanding and agreed with  it. Disposition Plan: Rehab in 1 to 2 days Consults called: Nephrology by EDP  admission status: Inpatient   Mckinley Jewel MD [facility]  If 7PM-7AM, please contact night-coverage [email or web address] Password [facility]  03/07/2020, 2:29 PM

## 2020-03-07 NOTE — ED Notes (Signed)
Antibiotics started before second set of cultures drawn.  Pt very difficult stick and Dr Billy Fischer stated it was ok to give antibiotics before 2nd set of cultures to prevent delay of care of pt.

## 2020-03-07 NOTE — ED Provider Notes (Signed)
[facility] EMERGENCY DEPARTMENT Provider Note   CSN: [id number] Arrival date & time: 03/07/20  1038     History Chief Complaint  Patient presents with  . Altered Mental Status  . Hypoglycemia    [name] [name] is a 54 y.o. female.  HPI     54yo female with history of DM, hypertension, ESRD on dialysis, BLE weakness, recurrent ESBL kllebsiella UTIs, pervious pubic symphysis septic arthritis and osteomyelitis and extraperitoneal left thigh abscess status post I&D of left groin placed in, at [facility] night of 9/1-AM 9/2 for purulent urinary findings, flank pain in AM finding of UTI--had CT showing slight decrease in size of ill-defined fluid collection along inferior aspect of pupic symphysis which continues to extend along the left adductor musculoature, rectal wall thickening and perirectal stranding, circumferential bladder wall thickening for which she had IR consult with recommendation for continued supportive care who presents from [facility] with concern for altered mental status, found to have hypoglycemia this AM.    Baseline is alert and oriented, goes to dialysis Today low blood sugars 44, given glucagon gel and 2 shots, wasn't waking up 730AM was responding normally talking Went back and came back around 830 she was responding but not appropriately--given glucagon gel, 730-8 appropriately, 830 was not responding--first sugar was 59 before anything, after glucagon gel was 44  Went to [facility] for evaluation, said she wasn't feeling well. No sign of focal abnormalities  Glaucoma surgery 73yrs ago  Can pull self off, use standing lift to get out of bed, pushes self around in wheelchair, feeds self. Went to [place] for wound care--a few months.   Past Medical History:  Diagnosis Date  . Colitis   . Diabetes mellitus   . Family history of adverse reaction to anesthesia    " my uncle's heart stoped "  . GERD (gastroesophageal reflux disease)   . Heart murmur    . Hypertension   . 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 Mitral regurgitation   . Neuropathy in diabetes ([place])   . Syncope and collapse 07/28/2015    Patient Active Problem List   Diagnosis Date Noted  . Sepsis ([place]) 03/07/2020  . ESRD (end stage renal disease) on dialysis ([place]) 03/07/2020  . Hyperkalemia 03/07/2020  . Acute cystitis 03/07/2020  . Acute metabolic encephalopathy 07/07/7251  . 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 Diabetes mellitus with complication ([place])   . Intractable headache   . Syncope 07/28/2015  . Chronic headaches 07/28/2015  . Chest pain 05/22/2015  . Urinary frequency 03/13/2015  . Easy bruising 10/09/2014  . Encounter for preventative adult health care exam with abnormal findings 01/11/2014  . Chronic constipation 01/11/2014  . GERD (gastroesophageal reflux disease) 01/11/2014  . Helicobacter positive gastritis 12/06/2013  . Dizziness and giddiness 10/12/2013  . Essential hypertension 09/19/2013  . Back pain 06/11/2013  . Lumbosacral spondylosis without myelopathy 02/20/2013  . Diabetic neuropathy ([place]) 01/09/2013  . DM (diabetes mellitus), type 2, uncontrolled ([place]) 10/02/2012  . MITRAL REGURGITATION, mild 10/31/2009  . PLEURAL EFFUSION, RIGHT after pyelonephritis in April 2011 (no intervention) 10/20/2009    Past Surgical History:  Procedure Laterality Date  . ABDOMINAL HYSTERECTOMY    . CATARACT EXTRACTION    . CESAREAN SECTION    . CHOLECYSTECTOMY    . EYE SURGERY Left retinal  . GANGLION CYST EXCISION    . TONSILLECTOMY    . TUBAL LIGATION       OB History  No obstetric history on file.     Family History  Problem Relation Age of Onset  . Diabetes Father   . Heart attack Father   . Aneurysm Mother     Social History   Tobacco Use  . Smoking status: Never Smoker  . Smokeless tobacco: Never Used  Substance Use Topics  . Alcohol use: No    Alcohol/week: 0.0 standard drinks  . Drug use: No    Home  Medications Prior to Admission medications   Medication Sig Start Date End Date Taking? Authorizing Provider  amLODipine (NORVASC) 10 MG tablet Take 10 mg by mouth daily. 02/25/20  Yes [provider]  Ascorbic Acid (VITAMIN C) 100 MG tablet Take 100 mg by mouth daily.   Yes [provider]  atropine 1 % ophthalmic solution Place 1 drop into the right eye 3 (three) times daily.   Yes [provider]  B Complex-C (B-COMPLEX WITH VITAMIN C) tablet Take 1 tablet by mouth daily.   Yes [provider]  bisacodyl (DULCOLAX) 10 MG suppository Place 10 mg rectally daily as needed for moderate constipation.   Yes [provider]  bisacodyl (DULCOLAX) 5 MG EC tablet Take 10 mg by mouth See admin instructions. 2 tabs (10mg ) every three days   Yes [provider]  carvedilol (COREG) 25 MG tablet Take 25 mg by mouth 2 (two) times daily. 03/04/20  Yes [provider]  ferrous sulfate 325 (65 FE) MG tablet Take 325 mg by mouth 2 (two) times daily with a meal.   Yes [provider]  gabapentin (NEURONTIN) 100 MG capsule Take 200 mg by mouth at bedtime.   Yes [provider]  gabapentin (NEURONTIN) 100 MG capsule Take 100 mg by mouth daily.   Yes [provider]  hydrALAZINE (APRESOLINE) 10 MG tablet Take 10 mg by mouth every 4 (four) hours as needed (BP if > 165/100).   Yes [provider]  Insulin Glargine (TOUJEO SOLOSTAR) 300 UNIT/ML SOPN Inject 50 Units into the skin at bedtime. Patient taking differently: Inject 13 Units into the skin daily.  11/21/15  Yes Biagio Borg, MD  insulin lispro (HUMALOG) 100 UNIT/ML injection Inject 2-14 Units into the skin daily. Per sliding scale: BG  201-250= 2 units, 251-300 = 4 units, 301-350= 6 units, 351-400= 8 units, 401-450= 10 units, 451-500 = 12 units, 500 or greater = 14 units.   Yes [provider]  isosorbide dinitrate (ISORDIL) 20 MG tablet Take 20 mg by mouth 3  (three) times daily.   Yes [provider]  LACTOBACILLUS EXTRA STRENGTH PO Take 1 capsule by mouth daily.   Yes [provider]  Magnesium 400 MG TABS Take 400 mg by mouth 2 (two) times daily.   Yes [provider]  oxyCODONE (ROXICODONE) 5 MG immediate release tablet Take 1 tablet (5 mg total) by mouth every 4 (four) hours as needed for severe pain. Patient taking differently: Take 5 mg by mouth every 6 (six) hours as needed for severe pain.  06/18/17  Yes Carlisle Cater, PA-C  polyethylene glycol (MIRALAX / GLYCOLAX) 17 g packet Take 17 g by mouth daily.   Yes [provider]  prednisoLONE acetate (PRED FORTE) 1 % ophthalmic suspension Place 1 drop into the right eye 4 (four) times daily.   Yes [provider]  sennosides-docusate sodium (SENOKOT-S) 8.6-50 MG tablet Take 2 tablets by mouth 2 (two) times daily.   Yes [provider]  amitriptyline (ELAVIL) 25 MG tablet 1/2 pill each bedtime x 1 week, then 1 pill nightly x 1 week, then 1 1/2 pills nightly x 1 week, then 2 pills nightly thereafter. Patient not taking: Reported on 03/07/2020 01/22/16   Star Age, MD  amLODipine (NORVASC) 5 MG tablet Take 1 tablet (5 mg total) by mouth daily. Patient not taking: Reported on 03/07/2020 05/03/15   Eulas Post, MD  atorvastatin (LIPITOR) 20 MG tablet Take 1 tablet (20 mg total) by mouth daily. Patient not taking: Reported on 03/07/2020 11/21/15   Biagio Borg, MD  Cyanocobalamin (VITAMIN B-12 PO) Take 1 tablet by mouth daily. Patient not taking: Reported on 03/07/2020    [provider]  dicyclomine (BENTYL) 20 MG tablet Take 1 tablet (20 mg total) by mouth 2 (two) times daily. Patient not taking: Reported on 03/07/2020 03/11/17   Charlesetta Shanks, MD  furosemide (LASIX) 20 MG tablet Take 1 tablet (20 mg total) by mouth daily. Patient not taking: Reported on 03/07/2020 07/30/15   Cristal Ford, DO  HUMULIN R U-500 KWIKPEN 500 UNIT/ML kwikpen  INJECT 18 UNITS UNDER THE SKIN EVERY MORNING AND 14 UNITS EVERY EVENING Patient not taking: Reported on 03/07/2020 12/24/15   Biagio Borg, MD  insulin regular human CONCENTRATED (HUMULIN R U-500 KWIKPEN) 500 UNIT/ML kwikpen Inject 14-18 Units into the skin 2 (two) times daily with a meal. 18 units in the morning and 14 units in the evening Patient not taking: Reported on 03/07/2020    [provider]  losartan (COZAAR) 25 MG tablet Take 1 tablet (25 mg total) by mouth daily. Patient not taking: Reported on 03/07/2020 07/30/15   Cristal Ford, DO  naproxen (NAPROSYN) 500 MG tablet Take 1 tablet (500 mg total) by mouth 2 (two) times daily. Patient not taking: Reported on 03/07/2020 06/18/17   Carlisle Cater, PA-C  omeprazole (PRILOSEC) 20 MG capsule Take 1 capsule (20 mg total) by mouth daily. Patient not taking: Reported on 03/07/2020 06/18/17   Carlisle Cater, PA-C  ondansetron (ZOFRAN) 8 MG tablet Take 1 tablet (8 mg total) by mouth every 6 (six) hours as needed for nausea or vomiting. Patient not taking: Reported on 03/07/2020 03/21/17   Mesner, Corene Cornea, MD  potassium chloride (K-DUR) 10 MEQ tablet Take 1 tablet (10 mEq total) by mouth daily. Patient not taking: Reported on 03/07/2020 07/30/15   Cristal Ford, DO  pregabalin (LYRICA) 150 MG capsule Take 1 capsule (150 mg total) by mouth 2 (two) times daily. Patient not taking: Reported on 03/07/2020 11/19/15   Biagio Borg, MD  traMADol (ULTRAM) 50 MG tablet Take 2 tablets (100 mg total) by mouth every 6 (six) hours as needed. Patient not taking: Reported on 03/07/2020 03/11/17   Charlesetta Shanks, MD    Allergies    Amitriptyline, Ciprofloxacin, Semaglutide, Tramadol, and Tylenol [acetaminophen]  Review of Systems   Review of Systems  Unable to perform ROS: Mental status change    Physical Exam Updated Vital Signs BP (!) 155/75   Pulse 75   Temp 99.9 F (37.7 C)   Resp 14   Ht 5' (1.524 m)   Wt 72.6 kg   SpO2 100%   BMI 31.26 kg/m    Physical Exam Vitals and nursing note reviewed.  Constitutional:      General: She is not in acute distress.    Appearance: She is well-developed. She is not diaphoretic.  HENT:     Head: Normocephalic and atraumatic.  Eyes:  Conjunctiva/sclera: Conjunctivae normal.     Comments: Asymmetric pupils, right greater than left   Cardiovascular:     Rate and Rhythm: Normal rate and regular rhythm.     Heart sounds: Normal heart sounds. No murmur heard.  No friction rub. No gallop.   Pulmonary:     Effort: Pulmonary effort is normal. No respiratory distress.     Breath sounds: Normal breath sounds. No wheezing or rales.  Abdominal:     General: There is no distension.     Palpations: Abdomen is soft.     Tenderness: There is no abdominal tenderness. There is no guarding.  Genitourinary:    Comments: Urethral discharge Musculoskeletal:        General: No tenderness.     Cervical back: Normal range of motion.  Skin:    General: Skin is warm and dry.     Findings: No erythema or rash.  Neurological:     Mental Status: She is alert.     Comments: Sleepy, nonfocal exam, intermittently follows commands States "i'm cold"     ED Results / Procedures / Treatments   Labs (all labs ordered are listed, but only abnormal results are displayed) Labs Reviewed  COMPREHENSIVE METABOLIC PANEL - Abnormal; Notable for the following components:      Result Value   Potassium 5.3 (*)    CO2 21 (*)    Glucose, Bld 166 (*)    BUN 63 (*)    Creatinine, Ser 6.65 (*)    Calcium 8.7 (*)    Albumin 3.1 (*)    GFR calc non Af Amer 7 (*)    GFR calc Af Amer 8 (*)    All other components within normal limits  CBC WITH DIFFERENTIAL/PLATELET - Abnormal; Notable for the following components:   WBC 11.6 (*)    RBC 5.18 (*)    MCH 25.5 (*)    MCHC 29.7 (*)    RDW 18.5 (*)    Neutro Abs 7.9 (*)    All other components within normal limits  URINALYSIS, ROUTINE W REFLEX MICROSCOPIC - Abnormal;  Notable for the following components:   Color, Urine STRAW (*)    APPearance TURBID (*)    Hgb urine dipstick SMALL (*)    Protein, ur >=300 (*)    Leukocytes,Ua MODERATE (*)    RBC / HPF >50 (*)    WBC, UA >50 (*)    Bacteria, UA MANY (*)    All other components within normal limits  LIPASE, BLOOD - Abnormal; Notable for the following components:   Lipase 61 (*)    All other components within normal limits  CBC - Abnormal; Notable for the following components:   RBC 5.44 (*)    HCT 47.3 (*)    MCH 25.9 (*)    MCHC 29.8 (*)    RDW 18.9 (*)    All other components within normal limits  CREATININE, SERUM - Abnormal; Notable for the following components:   Creatinine, Ser 6.57 (*)    GFR calc non Af Amer 7 (*)    GFR calc Af Amer 8 (*)    All other components within normal limits  MAGNESIUM - Abnormal; Notable for the following components:   Magnesium 3.1 (*)    All other components within normal limits  PHOSPHORUS - Abnormal; Notable for the following components:   Phosphorus 8.2 (*)    All other components within normal limits  CBG MONITORING, ED - Abnormal; Notable for  the following components:   Glucose-Capillary 172 (*)    All other components within normal limits  I-STAT CHEM 8, ED - Abnormal; Notable for the following components:   BUN 57 (*)    Creatinine, Ser 6.70 (*)    Glucose, Bld 159 (*)    Calcium, Ion 1.04 (*)    Hemoglobin 15.6 (*)    All other components within normal limits  SARS CORONAVIRUS 2 BY RT PCR (HOSPITAL ORDER, [facility])  CULTURE, BLOOD (ROUTINE X 2)  CULTURE, BLOOD (ROUTINE X 2)  URINE CULTURE  LACTIC ACID, PLASMA  LACTIC ACID, PLASMA  PROTIME-INR  APTT  T4, FREE  AMMONIA  TSH  HIV ANTIBODY (ROUTINE TESTING W REFLEX)  TSH  PROTIME-INR  PROCALCITONIN  COMPREHENSIVE METABOLIC PANEL  CBC  TROPONIN I (HIGH SENSITIVITY)  TROPONIN I (HIGH SENSITIVITY)    EKG EKG Interpretation  Date/Time:  Friday  March 07 2020 10:44:33 EDT Ventricular Rate:  52 PR Interval:    QRS Duration: 91 QT Interval:  518 QTC Calculation: 482 R Axis:   -33 Text Interpretation: Sinus rhythm Probable left atrial enlargement Left axis deviation Anteroseptal infarct, age indeterminate TW inversion lateral appears new No significant change since last tracing Confirmed by Gareth Morgan [phone]) on 03/07/2020 11:02:09 AM   Radiology CT ABDOMEN PELVIS WO CONTRAST  Result Date: 03/07/2020 CLINICAL DATA:  Altered mental status, leukocytosis, elevated serum lipase EXAM: CT ABDOMEN AND PELVIS WITHOUT CONTRAST TECHNIQUE: Multidetector CT imaging of the abdomen and pelvis was performed following the standard protocol without IV contrast. COMPARISON:  CT 10/22/2019 FINDINGS: Lower chest: Mild bibasilar atelectasis. Heart size is mildly enlarged. No pericardial effusion. Hepatobiliary: No focal abnormality identified within the unenhanced liver. Status post cholecystectomy. No biliary dilatation. Pancreas: Grossly unremarkable without evidence of peripancreatic inflammatory changes or fluid. No ductal dilatation identified. Spleen: Normal in size without focal abnormality. Adrenals/Urinary Tract: Unremarkable adrenal glands. Kidneys appear within normal limits. No hydronephrosis. Urinary bladder is diffusely thick-walled with associated fat stranding. Foley catheter is in place. Stomach/Bowel: Stomach is within normal limits. Small bowel is fluid-filled without dilation or evidence of obstruction. Mild-moderate volume of stool throughout the colon. No evidence of bowel wall thickening, distention, or inflammatory changes. Vascular/Lymphatic: Scattered aortoiliac atherosclerotic calcifications without aneurysm. Duplicated IVC. No new or enlarging abdominopelvic lymph nodes. Reproductive: Status post hysterectomy. No adnexal masses. Other: Previously seen suprapubic fluid collection has resolved. No ascites. No new abdominopelvic fluid  collection. No pneumoperitoneum. Small fat containing periumbilical hernia. Mild diffuse anasarca. Musculoskeletal: No acute or significant osseous findings. IMPRESSION: 1. Urinary bladder is diffusely thick-walled with associated fat stranding, suggestive of cystitis. Correlation with urinalysis is recommended. 2. Previously seen suprapubic fluid collection has resolved. No new abdominopelvic fluid collection or abscess. 3. No CT evidence of acute pancreatitis. 4. Mild-moderate volume of stool throughout the colon. No evidence of bowel obstruction. 5. Aortic atherosclerosis. (ICD10-I70.0). Electronically Signed   By: Davina Poke D.O.   On: 03/07/2020 12:52   CT Head Wo Contrast  Result Date: 03/07/2020 CLINICAL DATA:  Head trauma, altered mental status. EXAM: CT HEAD WITHOUT CONTRAST TECHNIQUE: Contiguous axial images were obtained from the base of the skull through the vertex without intravenous contrast. COMPARISON:  07/14/2019 FINDINGS: Brain: No evidence of acute infarction, hemorrhage, hydrocephalus, extra-axial collection or mass lesion/mass effect. Vascular: Calcific intracranial atherosclerosis. Skull: Normal. Negative for fracture or focal lesion. Sinuses/Orbits: No acute finding. Partially imaged right maxillary osteoma. Other: No mastoid effusion. IMPRESSION: No acute intracranial  abnormality. Electronically Signed   By: Margaretha Sheffield MD   On: 03/07/2020 12:43   DG Chest Port 1 View  Result Date: 03/07/2020 CLINICAL DATA:  Sepsis EXAM: PORTABLE CHEST 1 VIEW COMPARISON:  10/18/2019 FINDINGS: Mild cardiomegaly. Central line tip at the proximal right atrium. Lungs are clear. Vascularity is normal. No effusions. No acute bone finding. IMPRESSION: No active disease. Mild cardiomegaly. Central line tip at the proximal right atrium. Electronically Signed   By: Nelson Chimes M.D.   On: 03/07/2020 11:30    Procedures Procedures (including critical care time)  Medications Ordered in  ED Medications  vancomycin (VANCOREADY) IVPB 750 mg/150 mL (has no administration in time range)  heparin injection 5,000 Units (5,000 Units Subcutaneous Given 03/07/20 1920)  ondansetron (ZOFRAN) tablet 4 mg (has no administration in time range)    Or  ondansetron (ZOFRAN) injection 4 mg (has no administration in time range)  ceFEPIme (MAXIPIME) 1 g in sodium chloride 0.9 % 100 mL IVPB (has no administration in time range)  gabapentin (NEURONTIN) capsule 200 mg (has no administration in time range)  ceFEPIme (MAXIPIME) 2 g in sodium chloride 0.9 % 100 mL IVPB (0 g Intravenous Stopped 03/07/20 1220)  metroNIDAZOLE (FLAGYL) IVPB 500 mg (0 mg Intravenous Stopped 03/07/20 1630)  vancomycin (VANCOREADY) IVPB 1500 mg/300 mL (0 mg Intravenous Stopped 03/07/20 1720)  naloxone (NARCAN) injection 1 mg (1 mg Intravenous Given 03/07/20 1049)  naloxone (NARCAN) injection 1 mg (1 mg Intravenous Given 03/07/20 1045)  sodium zirconium cyclosilicate (LOKELMA) packet 10 g (10 g Oral Given 03/07/20 2116)    ED Course  I have reviewed the triage vital signs and the nursing notes.  Pertinent labs & imaging results that were available during my care of the patient were reviewed by me and considered in my medical decision making (see chart for details).    MDM Rules/Calculators/A&P                          54yo female with history of DM, hypertension, ESRD on dialysis, BLE weakness, recurrent ESBL kllebsiella UTIs, pervious pubic symphysis septic arthritis and osteomyelitis and extraperitoneal left thigh abscess status post I&D of left groin placed in, at [facility] night of 9/1-AM 9/2 for purulent urinary findings, flank pain in AM finding of UTI--had CT showing slight decrease in size of ill-defined fluid collection along inferior aspect of pupic symphysis which continues to extend along the left adductor musculoature, rectal wall thickening and perirectal stranding, circumferential bladder wall thickening for which she had IR  consult with recommendation for continued supportive care who presents from [facility] with concern for altered mental status, found to have hypoglycemia this AM.  Glucose improved on arrival however altered mental status continues to persist. Vital signs notable for hypothermia and hypoxia.  Given 1mg  of narcan x2 with potential minimal improvement--did state i'm cold after receiving this but continues to be somnolent.  Discussed with Neurology and agree given nonfocal findings exam/history appear more consistent with other encephalopathy rather than focal CNS abnormality.  CT head completed shows no acute abnormality.   Given broad spectrum empiric abx for recent UTI diagnosis, indwelling dialysis cath.  Abdominal CT done given AMS with limited history and recent abnormalities on CT and shows bladder thick walled with fat stranding and resolution of pelvic fluid collection.   Mental status improved significantly on reevaluation but continues to be sleepy. Unclear if presentation secondary to pt slowly improving from hypoglycemia with  hypothermia or if secondary to sepsis with UTI. Oxygenation improved.  Ammonia WNL. No hypothyroidism  Will admit for continued care        Final Clinical Impression(s) / ED Diagnoses Final diagnoses:  Altered mental status, unspecified altered mental status type  Hypothermia, initial encounter  Hypoglycemia    Rx / DC Orders ED Discharge Orders    None       Gareth Morgan, MD 03/07/20 2137

## 2020-03-07 NOTE — Progress Notes (Signed)
Pharmacy Antibiotic Note  [name] [name] is a 54 y.o. female admitted on 03/07/2020 with sepsis.  Pharmacy has been consulted for vancomycin and cefepime dosing.  ESRD-HD usually MWF  Plan: Vancomycin 1500 mg IV x 1, then 750mg  IV qHD Cefepime 1g IV a 24h Monitor HD schedule, Cx and clinical progression to narrow Vancomycin random level as needed     No data recorded.  No results for input(s): WBC, CREATININE, LATICACIDVEN, VANCOTROUGH, VANCOPEAK, VANCORANDOM, GENTTROUGH, GENTPEAK, GENTRANDOM, TOBRATROUGH, TOBRAPEAK, TOBRARND, AMIKACINPEAK, AMIKACINTROU, AMIKACIN in the last 168 hours.  CrCl cannot be calculated (Patient's most recent lab result is older than the maximum 21 days allowed.).    Allergies  Allergen Reactions  . Tylenol [Acetaminophen] Itching    Bertis Ruddy, PharmD Clinical Pharmacist ED Pharmacist Phone # [phone] 03/07/2020 11:14 AM

## 2020-03-08 ENCOUNTER — Encounter (HOSPITAL_COMMUNITY): Payer: Self-pay | Admitting: Internal Medicine

## 2020-03-08 LAB — COMPREHENSIVE METABOLIC PANEL
ALT: 97 U/L — ABNORMAL HIGH (ref 0–44)
AST: 69 U/L — ABNORMAL HIGH (ref 15–41)
Albumin: 2.9 g/dL — ABNORMAL LOW (ref 3.5–5.0)
Alkaline Phosphatase: 113 U/L (ref 38–126)
Anion gap: 15 (ref 5–15)
BUN: 46 mg/dL — ABNORMAL HIGH (ref 6–20)
CO2: 22 mmol/L (ref 22–32)
Calcium: 8.1 mg/dL — ABNORMAL LOW (ref 8.9–10.3)
Chloride: 102 mmol/L (ref 98–111)
Creatinine, Ser: 5.27 mg/dL — ABNORMAL HIGH (ref 0.44–1.00)
GFR calc Af Amer: 10 mL/min — ABNORMAL LOW (ref >60)
GFR calc non Af Amer: 9 mL/min — ABNORMAL LOW (ref >60)
Glucose, Bld: 149 mg/dL — ABNORMAL HIGH (ref 70–99)
Potassium: 4.1 mmol/L (ref 3.5–5.1)
Sodium: 139 mmol/L (ref 135–145)
Total Bilirubin: 0.6 mg/dL (ref 0.3–1.2)
Total Protein: 6.5 g/dL (ref 6.5–8.1)

## 2020-03-08 LAB — PROCALCITONIN: Procalcitonin: 6.82 ng/mL

## 2020-03-08 LAB — GLUCOSE, CAPILLARY
Glucose-Capillary: 109 mg/dL — ABNORMAL HIGH (ref 70–99)
Glucose-Capillary: 126 mg/dL — ABNORMAL HIGH (ref 70–99)
Glucose-Capillary: 132 mg/dL — ABNORMAL HIGH (ref 70–99)
Glucose-Capillary: 129 mg/dL — ABNORMAL HIGH (ref 70–99)
Glucose-Capillary: 199 mg/dL — ABNORMAL HIGH (ref 70–99)

## 2020-03-08 LAB — CBC
HCT: 41.3 % (ref 36.0–46.0)
Hemoglobin: 12.4 g/dL (ref 12.0–15.0)
MCH: 25.8 pg — ABNORMAL LOW (ref 26.0–34.0)
MCHC: 30 g/dL (ref 30.0–36.0)
MCV: 85.9 fL (ref 80.0–100.0)
Platelets: 165 10*3/uL (ref 150–400)
RBC: 4.81 MIL/uL (ref 3.87–5.11)
RDW: 18.4 % — ABNORMAL HIGH (ref 11.5–15.5)
WBC: 11.7 10*3/uL — ABNORMAL HIGH (ref 4.0–10.5)
nRBC: 0 % (ref 0.0–0.2)

## 2020-03-08 LAB — PROTIME-INR
INR: 1.1 (ref 0.8–1.2)
Prothrombin Time: 13.7 seconds (ref 11.4–15.2)

## 2020-03-08 LAB — HEPATITIS B SURFACE ANTIBODY,QUALITATIVE: Hep B S Ab: NONREACTIVE

## 2020-03-08 LAB — MRSA PCR SCREENING: MRSA by PCR: NEGATIVE

## 2020-03-08 LAB — HEPATITIS B SURFACE ANTIGEN: Hepatitis B Surface Ag: NONREACTIVE

## 2020-03-08 LAB — HEPATITIS B CORE ANTIBODY, TOTAL: Hep B Core Total Ab: NONREACTIVE

## 2020-03-08 MED ORDER — HEPARIN SODIUM (PORCINE) 1000 UNIT/ML IJ SOLN
INTRAMUSCULAR | Status: AC
  Filled 2020-03-08: qty 1

## 2020-03-08 MED ORDER — CHLORHEXIDINE GLUCONATE CLOTH 2 % EX PADS
6.0000 | MEDICATED_PAD | Freq: Every day | CUTANEOUS | Status: DC
  Administered 2020-03-08 – 2020-03-11 (×4): 6 via TOPICAL

## 2020-03-08 MED ORDER — CARVEDILOL 25 MG PO TABS: 25.0000 mg | ORAL_TABLET | Freq: Two times a day (BID) | ORAL | Status: DC

## 2020-03-08 MED ORDER — CHLORHEXIDINE GLUCONATE CLOTH 2 % EX PADS
6.0000 | MEDICATED_PAD | Freq: Every day | CUTANEOUS | Status: DC
  Administered 2020-03-09 – 2020-03-10 (×2): 6 via TOPICAL

## 2020-03-08 MED ORDER — DIPHENHYDRAMINE HCL 25 MG PO CAPS
50.0000 mg | ORAL_CAPSULE | Freq: Once | ORAL | Status: AC
  Administered 2020-03-08: 50 mg via ORAL
  Filled 2020-03-08: qty 2

## 2020-03-08 MED ORDER — VANCOMYCIN HCL 750 MG/150ML IV SOLN
750.0000 mg | INTRAVENOUS | Status: DC
  Filled 2020-03-08: qty 150

## 2020-03-08 MED ORDER — METOCLOPRAMIDE HCL 5 MG/ML IJ SOLN
10.0000 mg | Freq: Once | INTRAMUSCULAR | Status: AC
  Administered 2020-03-08: 10 mg via INTRAVENOUS
  Filled 2020-03-08: qty 2

## 2020-03-08 MED ORDER — HEPARIN SODIUM (PORCINE) 1000 UNIT/ML DIALYSIS: 3000.0000 [IU] | INTRAMUSCULAR | Status: DC | PRN

## 2020-03-08 MED ORDER — HYDRALAZINE HCL 20 MG/ML IJ SOLN
10.0000 mg | Freq: Once | INTRAMUSCULAR | Status: AC
  Administered 2020-03-08: 10 mg via INTRAVENOUS
  Filled 2020-03-08: qty 1

## 2020-03-08 MED ORDER — CARVEDILOL 25 MG PO TABS
25.0000 mg | ORAL_TABLET | Freq: Two times a day (BID) | ORAL | Status: DC
  Administered 2020-03-08 – 2020-03-15 (×12): 25 mg via ORAL
  Filled 2020-03-08 (×13): qty 1

## 2020-03-08 NOTE — Progress Notes (Signed)
SLP Cancellation Note  Patient Details Name: [name] [name] MRN: [id number] DOB: [birth year]   Cancelled treatment:       Reason Eval/Treat Not Completed: Other (comment). Pt passed [facility] swallow screen administered by RN in ED and was started on diet. Will defer SLP eval at this time.    Hermione Havlicek, Katherene Ponto 03/08/2020, 12:36 PM

## 2020-03-08 NOTE — Procedures (Signed)
   I was present at this dialysis session, have reviewed the session itself and made  appropriate changes Kelly Splinter MD [place] pager [phone]   03/08/2020, 3:40 PM

## 2020-03-08 NOTE — Progress Notes (Signed)
PROGRESS NOTE  [name] [name] RKY:[id number] DOB: 04/23/1966 DOA: 03/07/2020 PCP: Biagio Borg, MD  HPI/Recap of past 24 hours: [name] [name] is a 54 y.o. female with medical history significant of hypertension, insulin-dependent type 2 diabetes, diabetic neuropathy, ESRD on hemodialysis MWF, NAFLD, chronic bilateral lower extremity weakness, recurrent ESBL Klebsiella UTI, history of pubic symphysis septic arthritis and osteomyelitis and extraperitoneal left thigh abscess status post I & D of left groin on 11/26/2019 brought by EMS from SNF to the emergency department for evaluation of AMS.  Patient tells me that she went to [facility] ED on 9/1 for evaluation of UTI.  Patient was complaining of foul-smelling urine with purulent material/discharge when wiping.  She had CT abdomen shows ill-defined fluid collection along the inferior aspect of pubic symphysis which continues to extend along the left adductor musculature, rectal wall thickening and perirectal stranding, circumferential bladder wall thickening for which  IR was consulted who recommended supportive care and no drainable collection identified.  On the day of presentation, at rehab patient was noted to be confused and unresponsive.  EMS was called-her initial CBG was noted to be 44 and improved to 190 after glucagon.  Blood pressure was noted to be 88/45 and was given a bolus of normal saline and BP increased to 106/68.  Patient was brought to ER for further evaluation and management.  ED Course: UA positive for leukocyte, RBC, WBC and bacteria, PT/INR, APTT, T4, TSH, ammonia level: WNL, lipase: 61, COVID-19 negative, troponin: Negative, CT head negative for acute findings CT abdomen/pelvis concerning for acute cystitis.  Chest x-ray negative for acute findings.  Patient received cefepime, vancomycin, Flagyl and Nalaxone in the ED.  03/08/20: Seen and examined.  States she feels better this morning.  States she came in  because she received too much insulin at the facility.  States she feels weak.  Normal CBGs this morning.  Assessment/Plan: Principal Problem:   Sepsis ([place]) Active Problems:   DM (diabetes mellitus), type 2, uncontrolled ([place])   Diabetic neuropathy ([place])   Essential hypertension   ESRD (end stage renal disease) on dialysis ([place])   Hyperkalemia   Acute cystitis   Acute metabolic encephalopathy  Sepsis secondary to presumed UTI: -Patient has history of recurrent ESBL Klebsiella UTI.  UA is positive for leukocyte, RBC, WBC and bacteria.  CT abdomen/pelvis concerning for acute cystitis.  Patient hypotensive, hypothermic, has leukocytosis of 11.6K.  -Received cefepime, vancomycin and Flagyl in the ED Antibiotics deescalated, IV vancomycin and IV Flagyl discontinued on 03/08/2020. Continue to monitor closely  Resolved acute metabolic encephalopathy likely secondary to hypoglycemia versus sepsis: -CT head is negative for acute findings. -TSH, free T4, ammonia level: WNL, COVID-19 negative She is back to her baseline mentation.  ESRD on hemodialysis MWF: Mildly hypervolemic this morning Last hemodialysis was on Wednesday 03/05/20 Planned hemodialysis today Appreciate nephrology's assistance  Resolved hyperkalemia:  Potassium of 5.3> 4.1. Received a dose of Lokelma.  Hypomagnesemia Serum magnesium 3.1 Electrolytes managed with hemodialysis Repletion nephrology's assistance  Abnormal LFTs, unclear etiology Trend levels Repeat in the morning  Insulin-dependent type 2 diabetes mellitus:  Patient hypoglycemic upon arrival Avoid hypoglycemia Obtain hemoglobin A1c Insulin sliding scale  Essential hypertension:  Hypotensive upon arrival to the ED  Home BP meds-Coreg, amlodipine, hydralazine -Monitor blood pressure closely.   BP is currently not at goal Resume home BP meds once blood pressure is back to baseline.  Diabetic neuropathy:  Continue gabapentin  Chronic  bilateral  lower extremity weakness: -No nerve response in legs on EMG on 10/2018. -PT OT to assess Fall precaution  DVT prophylaxis: Heparin subcu 3 times daily/SCD Code Status: Full code Family Communication:  None at bedside.   Consults called: Nephrology by EDP   Disposition:  Status is: Inpatient    Dispo:  Patient From: [facility]  Planned Disposition: [place]  Expected discharge date: 03/10/20  Medically stable for discharge: No, ongoing work-up and treatment of presumed UTI.         Objective: Vitals:   03/08/20 1230 03/08/20 1300 03/08/20 1330 03/08/20 1400  BP: (!) 194/96 (!) 162/84 (!) 163/86 (!) 165/83  Pulse: 78 73 74 74  Resp: 18 12  19   Temp:      TempSrc:      SpO2:      Weight:      Height:        Intake/Output Summary (Last 24 hours) at 03/08/2020 1449 Last data filed at 03/07/2020 1720 Gross per 24 hour  Intake 374.27 ml  Output --  Net 374.27 ml   Filed Weights   03/07/20 1347 03/08/20 0350 03/08/20 1225  Weight: 72.6 kg 70.2 kg 70.8 kg    Exam:  . General: 54 y.o. year-old female well developed well nourished in no acute distress.  Alert and oriented x3. . Cardiovascular: Regular rate and rhythm with no rubs or gallops.  No thyromegaly or JVD noted. Marland Kitchen Respiratory: Clear to auscultation with no wheezes or rales. Good inspiratory effort. . Abdomen: Soft nontender nondistended with normal bowel sounds x4 quadrants. . Musculoskeletal: 1+ pitting edema in lower extremities bilaterally. Marland Kitchen Psychiatry: Mood is appropriate for condition and setting   Data Reviewed: CBC: Recent Labs  Lab 03/07/20 1128 03/07/20 1145 03/07/20 1422 03/08/20 1327  WBC 11.6*  --  9.2 11.7*  NEUTROABS 7.9*  --   --   --   HGB 13.2 15.6* 14.1 12.4  HCT 44.5 46.0 47.3* 41.3  MCV 85.9  --  86.9 85.9  PLT 184  --  182 734   Basic Metabolic Panel: Recent Labs  Lab 03/07/20 1128 03/07/20 1145 03/07/20 1422 03/08/20 1327   NA 138 137  --  139  K 5.3* 5.0  --  4.1  CL 102 105  --  102  CO2 21*  --   --  22  GLUCOSE 166* 159*  --  149*  BUN 63* 57*  --  46*  CREATININE 6.65* 6.70* 6.57* 5.27*  CALCIUM 8.7*  --   --  8.1*  MG  --   --  3.1*  --   PHOS  --   --  8.2*  --    GFR: Estimated Creatinine Clearance: 10.8 mL/min (A) (by C-G formula based on SCr of 5.27 mg/dL (H)). Liver Function Tests: Recent Labs  Lab 03/07/20 1128 03/08/20 1327  AST 26 69*  ALT 27 97*  ALKPHOS 108 113  BILITOT 0.5 0.6  PROT 6.7 6.5  ALBUMIN 3.1* 2.9*   Recent Labs  Lab 03/07/20 1128  LIPASE 61*   Recent Labs  Lab 03/07/20 1128  AMMONIA 18   Coagulation Profile: Recent Labs  Lab 03/07/20 1128  INR 1.1   Cardiac Enzymes: No results for input(s): CKTOTAL, CKMB, CKMBINDEX, TROPONINI in the last 168 hours. BNP (last 3 results) No results for input(s): PROBNP in the last 8760 hours. HbA1C: No results for input(s): HGBA1C in the last 72 hours. CBG: Recent Labs  Lab  03/07/20 1041 03/08/20 0354 03/08/20 0755 03/08/20 1124  GLUCAP 172* 109* 126* 132*   Lipid Profile: No results for input(s): CHOL, HDL, LDLCALC, TRIG, CHOLHDL, LDLDIRECT in the last 72 hours. Thyroid Function Tests: Recent Labs    03/07/20 1128 03/07/20 1128 03/07/20 1427  TSH 4.109   < > 3.140  FREET4 0.88  --   --    < > = values in this interval not displayed.   Anemia Panel: No results for input(s): VITAMINB12, FOLATE, FERRITIN, TIBC, IRON, RETICCTPCT in the last 72 hours. Urine analysis:    Component Value Date/Time   COLORURINE STRAW (A) 03/07/2020 1123   APPEARANCEUR TURBID (A) 03/07/2020 1123   LABSPEC 1.019 03/07/2020 1123   PHURINE 7.0 03/07/2020 1123   GLUCOSEU NEGATIVE 03/07/2020 1123   GLUCOSEU >=1000 (A) 11/19/2015 1505   HGBUR SMALL (A) 03/07/2020 1123   BILIRUBINUR NEGATIVE 03/07/2020 1123   BILIRUBINUR neg 03/13/2015 1858   KETONESUR NEGATIVE 03/07/2020 1123   PROTEINUR >=300 (A) 03/07/2020 1123    UROBILINOGEN 0.2 11/19/2015 1505   NITRITE NEGATIVE 03/07/2020 1123   LEUKOCYTESUR MODERATE (A) 03/07/2020 1123   Sepsis Labs: @LABRCNTIP (procalcitonin:4,lacticidven:4)  ) Recent Results (from the past 240 hour(s))  Urine culture     Status: Abnormal (Preliminary result)   Collection Time: 03/07/20 11:23 AM   Specimen: In/Out Cath Urine  Result Value Ref Range Status   Specimen Description IN/OUT CATH URINE  Final   Special Requests NONE  Final   Culture (A)  Final    >=100,000 COLONIES/mL ENTEROCOCCUS FAECALIS CULTURE REINCUBATED FOR BETTER GROWTH Performed at [facility] [address] [place], [place] [address]    Report Status PENDING  Incomplete  SARS Coronavirus 2 by RT PCR (hospital order, performed in [facility] hospital lab) Nasopharyngeal Nasopharyngeal Swab     Status: None   Collection Time: 03/07/20 11:28 AM   Specimen: Nasopharyngeal Swab  Result Value Ref Range Status   SARS Coronavirus 2 NEGATIVE NEGATIVE Final    Comment: (NOTE) SARS-CoV-2 target nucleic acids are NOT DETECTED.  The SARS-CoV-2 RNA is generally detectable in upper and lower respiratory specimens during the acute phase of infection. The lowest concentration of SARS-CoV-2 viral copies this assay can detect is 250 copies / mL. A negative result does not preclude SARS-CoV-2 infection and should not be used as the sole basis for treatment or other patient management decisions.  A negative result may occur with improper specimen collection / handling, submission of specimen other than nasopharyngeal swab, presence of viral mutation(s) within the areas targeted by this assay, and inadequate number of viral copies (<250 copies / mL). A negative result must be combined with clinical observations, patient history, and epidemiological information.  Fact Sheet for Patients:   StrictlyIdeas.no  Fact Sheet for Healthcare  Providers: BankingDealers.co.za  This test is not yet approved or  cleared by the [place] FDA and has been authorized for detection and/or diagnosis of SARS-CoV-2 by FDA under an Emergency Use Authorization (EUA).  This EUA will remain in effect (meaning this test can be used) for the duration of the COVID-19 declaration under Section 564(b)(1) of the Act, 21 U.S.C. section 360bbb-3(b)(1), unless the authorization is terminated or revoked sooner.  Performed at [facility], [place] [address], [place], [place] [address]   MRSA PCR Screening     Status: None   Collection Time: 03/08/20  6:14 AM   Specimen: Nasopharyngeal  Result Value Ref Range Status   MRSA  by PCR NEGATIVE NEGATIVE Final    Comment:        The GeneXpert MRSA Assay (FDA approved for NASAL specimens only), is one component of a comprehensive MRSA colonization surveillance program. It is not intended to diagnose MRSA infection nor to guide or monitor treatment for MRSA infections. Performed at [facility], [place] [address], [place], [place] [address]       Studies: No results found.  Scheduled Meds: . Chlorhexidine Gluconate Cloth  6 each Topical Daily  . Chlorhexidine Gluconate Cloth  6 each Topical Q0600  . gabapentin  200 mg Oral QHS  . heparin  5,000 Units Subcutaneous Q8H    Continuous Infusions: . ceFEPime (MAXIPIME) IV    . vancomycin       LOS: 1 day     Kayleen Memos, MD [facility] Pager [phone]  If 7PM-7AM, please contact night-coverage [email or web address] Password [facility] 03/08/2020, 2:49 PM

## 2020-03-08 NOTE — Consult Note (Signed)
Renal Service Consult Note [facility]  [name] [name] 03/08/2020 [name] [name] Requesting Physician:  [name] [name], C.   Reason for Consult:  ESRD pt w/  HPI: The patient is a 54 y.o. year-old w/ hx of HTN, DM2, colitis and ESRD f/b [name] [name] on HD at [facility] MWF. Pt has hx of chronic bilat LE weakness in and out of SNF's.  Hx septic arthritis/ osteo of the pubic symphysis w/ assoc L thigh abscess rx'd surgically (I&D left groin) in May 2021 at [facility]. Pt noted "pus"-like urine for the past few days. Went to [facility] and was eval in ED on 9/1, CT abdomen was done reportedly, but pt was not admitted. She then was found poorly responsive at the SNF and sent to ED here. In ED pt was confused and lethargic, BS 44 and BP 88/45 which improved w/ saline 100 cc to 106/68. Temp 93.7, lastic acid wnl, wbc 11K. CT pelvis showed findings c/w acute cystitis. Pt got IV vefepime , vanc/ flagyl and nalaxone in ED and was admitted. Asked to see for dialysis.   Pt started HD around Aug 2020. She is using a tunneled HD cath. She was on schedule for an arm access but missed the appt. Pt has been in and out of rehab facilities for sometime. Has DM w/ sig retinopathy and also bilat leg weakness on a chronic basis.    Admits:  2017 - admit for syncope d/t vol depletion/ hypotension, BP's meds lowered, lasix dc'd, echo wnl, uncont DM2  2014 - admit for CP, had neg stress test, also had Ecoli UTI, DM2  ROS  denies CP  no joint pain   no HA  no blurry vision  no rash  no diarrhea  no nausea/ vomiting   Past Medical History  Past Medical History:  Diagnosis Date  . Colitis   . Diabetes mellitus   . Family history of adverse reaction to anesthesia    " my uncle's heart stoped "  . GERD (gastroesophageal reflux disease)   . Heart murmur   . Hypertension   . 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 Mitral regurgitation   . Neuropathy in diabetes ([place])   . Syncope and collapse 07/28/2015   Past  Surgical History  Past Surgical History:  Procedure Laterality Date  . ABDOMINAL HYSTERECTOMY    . CATARACT EXTRACTION    . CESAREAN SECTION    . CHOLECYSTECTOMY    . EYE SURGERY Left retinal  . GANGLION CYST EXCISION    . TONSILLECTOMY    . TUBAL LIGATION     Family History  Family History  Problem Relation [name] of Onset  . Diabetes Father   . Heart attack Father   . Aneurysm Mother    Social History  reports that she has never smoked. She has never used smokeless tobacco. She reports that she does not drink alcohol and does not use drugs. Allergies  Allergies  Allergen Reactions  . Amitriptyline Swelling    Other reaction(s): Confusion (intolerance)  . Ciprofloxacin Diarrhea and Nausea And Vomiting    VOMITING Severe vomiting requiring ED visit, IV reglan and IV fluids VOMITING   . Semaglutide Nausea Only  . Tramadol Itching  . Tylenol [Acetaminophen] Itching   Home medications Prior to Admission medications   Medication Sig Start Date End Date Taking? Authorizing Provider  amLODipine (NORVASC) 10 MG tablet Take 10 mg by mouth daily. 02/25/20  Yes [provider]  Ascorbic Acid (VITAMIN C) 100 MG tablet Take 100 mg by mouth daily.   Yes [provider]  atropine 1 % ophthalmic solution Place 1 drop into the right eye 3 (three) times daily.   Yes [provider]  B Complex-C (B-COMPLEX WITH VITAMIN C) tablet Take 1 tablet by mouth daily.   Yes [provider]  bisacodyl (DULCOLAX) 10 MG suppository Place 10 mg rectally daily as needed for moderate constipation.   Yes [provider]  bisacodyl (DULCOLAX) 5 MG EC tablet Take 10 mg by mouth See admin instructions. 2 tabs (10mg ) every three days   Yes [provider]  carvedilol (COREG) 25 MG tablet Take 25 mg by mouth 2 (two) times daily. 03/04/20  Yes [provider]  ferrous sulfate 325 (65 FE) MG tablet Take 325 mg by mouth 2 (two) times daily with a meal.   Yes  [provider]  gabapentin (NEURONTIN) 100 MG capsule Take 200 mg by mouth at bedtime.   Yes [provider]  gabapentin (NEURONTIN) 100 MG capsule Take 100 mg by mouth daily.   Yes [provider]  hydrALAZINE (APRESOLINE) 10 MG tablet Take 10 mg by mouth every 4 (four) hours as needed (BP if > 165/100).   Yes [provider]  Insulin Glargine (TOUJEO SOLOSTAR) 300 UNIT/ML SOPN Inject 50 Units into the skin at bedtime. Patient taking differently: Inject 13 Units into the skin daily.  11/21/15  Yes Biagio Borg, MD  insulin lispro (HUMALOG) 100 UNIT/ML injection Inject 2-14 Units into the skin daily. Per sliding scale: BG  201-250= 2 units, 251-300 = 4 units, 301-350= 6 units, 351-400= 8 units, 401-450= 10 units, 451-500 = 12 units, 500 or greater = 14 units.   Yes [provider]  isosorbide dinitrate (ISORDIL) 20 MG tablet Take 20 mg by mouth 3 (three) times daily.   Yes [provider]  LACTOBACILLUS EXTRA STRENGTH PO Take 1 capsule by mouth daily.   Yes [provider]  Magnesium 400 MG TABS Take 400 mg by mouth 2 (two) times daily.   Yes [provider]  oxyCODONE (ROXICODONE) 5 MG immediate release tablet Take 1 tablet (5 mg total) by mouth every 4 (four) hours as needed for severe pain. Patient taking differently: Take 5 mg by mouth every 6 (six) hours as needed for severe pain.  06/18/17  Yes Carlisle Cater, PA-C  polyethylene glycol (MIRALAX / GLYCOLAX) 17 g packet Take 17 g by mouth daily.   Yes [provider]  prednisoLONE acetate (PRED FORTE) 1 % ophthalmic suspension Place 1 drop into the right eye 4 (four) times daily.   Yes [provider]  sennosides-docusate sodium (SENOKOT-S) 8.6-50 MG tablet Take 2 tablets by mouth 2 (two) times daily.   Yes [provider]  amitriptyline (ELAVIL) 25 MG tablet 1/2 pill each bedtime x 1 week, then 1 pill nightly x 1 week, then 1 1/2 pills nightly x 1  week, then 2 pills nightly thereafter. Patient not taking: Reported on 03/07/2020 01/22/16   [name] Age, MD  amLODipine (NORVASC) 5 MG tablet Take 1 tablet (5 mg total) by mouth daily. Patient not taking: Reported on 03/07/2020 05/03/15   Eulas Post, MD  atorvastatin (LIPITOR) 20 MG tablet Take 1 tablet (20 mg total) by mouth daily. Patient not taking: Reported on 03/07/2020 11/21/15   Biagio Borg, MD  Cyanocobalamin (VITAMIN B-12 PO) Take 1 tablet by mouth daily. Patient not  taking: Reported on 03/07/2020    [provider]  dicyclomine (BENTYL) 20 MG tablet Take 1 tablet (20 mg total) by mouth 2 (two) times daily. Patient not taking: Reported on 03/07/2020 03/11/17   Charlesetta Shanks, MD  furosemide (LASIX) 20 MG tablet Take 1 tablet (20 mg total) by mouth daily. Patient not taking: Reported on 03/07/2020 07/30/15   Cristal Ford, DO  HUMULIN R U-500 KWIKPEN 500 UNIT/ML kwikpen INJECT 18 UNITS UNDER THE SKIN EVERY MORNING AND 14 UNITS EVERY EVENING Patient not taking: Reported on 03/07/2020 12/24/15   Biagio Borg, MD  insulin regular human CONCENTRATED (HUMULIN R U-500 KWIKPEN) 500 UNIT/ML kwikpen Inject 14-18 Units into the skin 2 (two) times daily with a meal. 18 units in the morning and 14 units in the evening Patient not taking: Reported on 03/07/2020    [provider]  losartan (COZAAR) 25 MG tablet Take 1 tablet (25 mg total) by mouth daily. Patient not taking: Reported on 03/07/2020 07/30/15   Cristal Ford, DO  naproxen (NAPROSYN) 500 MG tablet Take 1 tablet (500 mg total) by mouth 2 (two) times daily. Patient not taking: Reported on 03/07/2020 06/18/17   Carlisle Cater, PA-C  omeprazole (PRILOSEC) 20 MG capsule Take 1 capsule (20 mg total) by mouth daily. Patient not taking: Reported on 03/07/2020 06/18/17   Carlisle Cater, PA-C  ondansetron (ZOFRAN) 8 MG tablet Take 1 tablet (8 mg total) by mouth every 6 (six) hours as needed for nausea or vomiting. Patient not taking:  Reported on 03/07/2020 03/21/17   Mesner, Corene Cornea, MD  potassium chloride (K-DUR) 10 MEQ tablet Take 1 tablet (10 mEq total) by mouth daily. Patient not taking: Reported on 03/07/2020 07/30/15   Cristal Ford, DO  pregabalin (LYRICA) 150 MG capsule Take 1 capsule (150 mg total) by mouth 2 (two) times daily. Patient not taking: Reported on 03/07/2020 11/19/15   Biagio Borg, MD  traMADol (ULTRAM) 50 MG tablet Take 2 tablets (100 mg total) by mouth every 6 (six) hours as needed. Patient not taking: Reported on 03/07/2020 03/11/17   Charlesetta Shanks, MD     Vitals:   03/08/20 0350 03/08/20 0359 03/08/20 0759 03/08/20 0800  BP: (!) 172/76   (!) 166/77  Pulse: 76  79 78  Resp: 18 17 18    Temp: 100.2 F (37.9 C) 100 F (37.8 C) 99.6 F (37.6 C) 99.1 F (37.3 C)  TempSrc: Oral Oral Oral   SpO2: 98%   100%  Weight: 70.2 kg     Height:       Exam Gen alert, coherent, no  distress No rash, cyanosis or gangrene Sclera anicteric, throat clear  No jvd or bruits Chest clear bilat no rales or wheezing RRR +2/6 sem, no RG Abd soft ntnd no mass or ascites +bs GU defer MS no joint effusions or deformity Ext trace ankle edema, no wounds or ulcers Neuro is alert, Ox 3 , nf L chest TDC    Home meds:  - norvasc 10/ coreg 25 bid/ hydralazine 10 qid prn/ isordil 20 tid/ lipitor 20/ lasix 20 qd/ ?losartan 25 qd  - gabapentin 100 qdoxy IR qid prn/ elavil 25-50 hs prn/ lyrica 150 bid/ tramadol 100 qid prn  - prilosec 20 qd/ kdur 10 qd  - insulin regular 14-18 u qam and qhs/ glargine 13u hs  - prn's/ vitamins/ supplements/ eyedrops     OP HD: [name]  MWF   3.5h   350/700  70.5kg  L IJ TDC  Hep 5000 then 500/hr   Assessment/ Plan: 1. Purulent cystitis - in ESRD pt started HD in Aug 2020.  Per primary 2. AMS - looks alert and Ox 3 this am, prob better w/ Rx of infection 3. ESRD - HD missed yest, plan HD today.  4. BP/ vol - no vol on exam, under dry, keep even w/ HD today. BP's  soft.  5. DM2 insulin- dependent - w/ sig complications 6. HTN - hold meds for now 7. Anemia ckd - transfuse prn, Hb 14 here w/ dehydration 8. MBD ckd - cont binders when eating      Rob Nicha Hemann  MD 03/08/2020, 11:15 AM  Recent Labs  Lab 03/07/20 1128 03/07/20 1128 03/07/20 1145 03/07/20 1422  WBC 11.6*  --   --  9.2  HGB 13.2   < > 15.6* 14.1   < > = values in this interval not displayed.   Recent Labs  Lab 03/07/20 1128 03/07/20 1128 03/07/20 1145 03/07/20 1422  K 5.3*  --  5.0  --   BUN 63*  --  57*  --   CREATININE 6.65*   < > 6.70* 6.57*  CALCIUM 8.7*  --   --   --   PHOS  --   --   --  8.2*   < > = values in this interval not displayed.

## 2020-03-08 NOTE — Progress Notes (Signed)
Pt is gone to HD and will retry at another time.   03/08/20 1400  PT Visit Information  Reason Eval/Treat Not Completed Medical issues which prohibited therapy;Patient at procedure or test/unavailable   Mee Hives, PT MS Acute Rehab Dept. Number: [place]

## 2020-03-08 NOTE — Progress Notes (Signed)
Patient's BP 197/82 upon return from HD. Notified Dr. Nevada Crane who restarted patient's home coreg. Coreg given to patient.

## 2020-03-08 NOTE — ED Notes (Signed)
Given verbal by Dr. Doristine Bosworth. Pt can have PO meds, pt passed swallow screen

## 2020-03-09 ENCOUNTER — Other Ambulatory Visit: Payer: Self-pay

## 2020-03-09 LAB — CBC
HCT: 44.6 % (ref 36.0–46.0)
Hemoglobin: 13.3 g/dL (ref 12.0–15.0)
MCH: 25.6 pg — ABNORMAL LOW (ref 26.0–34.0)
MCHC: 29.8 g/dL — ABNORMAL LOW (ref 30.0–36.0)
MCV: 85.9 fL (ref 80.0–100.0)
Platelets: 174 10*3/uL (ref 150–400)
RBC: 5.19 MIL/uL — ABNORMAL HIGH (ref 3.87–5.11)
RDW: 18.6 % — ABNORMAL HIGH (ref 11.5–15.5)
WBC: 10.4 10*3/uL (ref 4.0–10.5)
nRBC: 0 % (ref 0.0–0.2)

## 2020-03-09 LAB — BASIC METABOLIC PANEL
Anion gap: 18 — ABNORMAL HIGH (ref 5–15)
BUN: 27 mg/dL — ABNORMAL HIGH (ref 6–20)
CO2: 20 mmol/L — ABNORMAL LOW (ref 22–32)
Calcium: 8.4 mg/dL — ABNORMAL LOW (ref 8.9–10.3)
Chloride: 100 mmol/L (ref 98–111)
Creatinine, Ser: 4.11 mg/dL — ABNORMAL HIGH (ref 0.44–1.00)
GFR calc Af Amer: 13 mL/min — ABNORMAL LOW (ref >60)
GFR calc non Af Amer: 12 mL/min — ABNORMAL LOW (ref >60)
Glucose, Bld: 123 mg/dL — ABNORMAL HIGH (ref 70–99)
Potassium: 4.1 mmol/L (ref 3.5–5.1)
Sodium: 138 mmol/L (ref 135–145)

## 2020-03-09 LAB — HEMOGLOBIN A1C
Hgb A1c MFr Bld: 5.8 % — ABNORMAL HIGH (ref 4.8–5.6)
Mean Plasma Glucose: 119.76 mg/dL

## 2020-03-09 LAB — GLUCOSE, CAPILLARY
Glucose-Capillary: 127 mg/dL — ABNORMAL HIGH (ref 70–99)
Glucose-Capillary: 181 mg/dL — ABNORMAL HIGH (ref 70–99)
Glucose-Capillary: 178 mg/dL — ABNORMAL HIGH (ref 70–99)
Glucose-Capillary: 168 mg/dL — ABNORMAL HIGH (ref 70–99)

## 2020-03-09 MED ORDER — SODIUM CHLORIDE 0.9 % IV SOLN
3.0000 g | Freq: Two times a day (BID) | INTRAVENOUS | Status: DC
  Administered 2020-03-09 – 2020-03-10 (×3): 3 g via INTRAVENOUS
  Filled 2020-03-09: qty 8
  Filled 2020-03-09: qty 3
  Filled 2020-03-09: qty 8
  Filled 2020-03-09: qty 3

## 2020-03-09 MED ORDER — INSULIN ASPART 100 UNIT/ML ~~LOC~~ SOLN: 0.0000 [IU] | Freq: Every day | SUBCUTANEOUS | Status: DC

## 2020-03-09 MED ORDER — INSULIN ASPART 100 UNIT/ML ~~LOC~~ SOLN
0.0000 [IU] | Freq: Three times a day (TID) | SUBCUTANEOUS | Status: DC
  Administered 2020-03-09 – 2020-03-11 (×3): 1 [IU] via SUBCUTANEOUS

## 2020-03-09 MED ORDER — ISOSORBIDE DINITRATE 10 MG PO TABS: 20.0000 mg | ORAL_TABLET | Freq: Three times a day (TID) | ORAL | Status: DC

## 2020-03-09 MED ORDER — LOSARTAN POTASSIUM 25 MG PO TABS
25.0000 mg | ORAL_TABLET | Freq: Every day | ORAL | Status: DC
  Administered 2020-03-09: 25 mg via ORAL
  Filled 2020-03-09: qty 1

## 2020-03-09 MED ORDER — AMLODIPINE BESYLATE 10 MG PO TABS
10.0000 mg | ORAL_TABLET | Freq: Every day | ORAL | Status: DC
  Administered 2020-03-09 – 2020-03-15 (×6): 10 mg via ORAL
  Filled 2020-03-09 (×6): qty 1

## 2020-03-09 NOTE — Progress Notes (Signed)
Pharmacy Antibiotic Note  [name] [name] is a 54 y.o. female admitted on 03/07/2020 with enterococcus faecalis UTI.  Pharmacy has been consulted for Unasyn dosing.  Plan: Unasyn 3gm IV q12h Will f/u sensitivities  Height: 5' (152.4 cm) Weight: 72.6 kg (160 lb 0.9 oz) IBW/kg (Calculated) : 45.5  Temp (24hrs), Avg:99.4 F (37.4 C), Min:98.4 F (36.9 C), Max:100.5 F (38.1 C)  Recent Labs  Lab 03/07/20 1128 03/07/20 1145 03/07/20 1258 03/07/20 1422 03/08/20 1327  WBC 11.6*  --   --  9.2 11.7*  CREATININE 6.65* 6.70*  --  6.57* 5.27*  LATICACIDVEN 0.9  --  0.8  --   --     Estimated Creatinine Clearance: 11 mL/min (A) (by C-G formula based on SCr of 5.27 mg/dL (H)).    Allergies  Allergen Reactions  . Amitriptyline Swelling    Other reaction(s): Confusion (intolerance)  . Ciprofloxacin Diarrhea and Nausea And Vomiting    VOMITING Severe vomiting requiring ED visit, IV reglan and IV fluids VOMITING   . Semaglutide Nausea Only  . Tramadol Itching  . Tylenol [Acetaminophen] Itching    Antimicrobials this admission: Vanc 9/3 >> 9/4 Cefepime 9/3 >>9/5 Unasyn 9/5 >>   Microbiology results:  9/3 BCx: ngtd 9/3 UCx: 100kcol enterococcus faecalis 9/4: MRSA PCR: negative  Thank you for allowing pharmacy to be a part of this patient's care.  Sherlon Handing, PharmD, BCPS Please see amion for complete clinical pharmacist phone list 03/09/2020 6:21 AM

## 2020-03-09 NOTE — Evaluation (Signed)
Physical Therapy Evaluation Patient Details Name: [name] [name] MRN: [id number] DOB: 1965/11/27 Today's Date: 03/09/2020   History of Present Illness  Patient is a 54 y/o female who presents from [facility] with AMS; found to have hypoglycemia, metabolic encephalopathy and sepsis secondary to UTI. CT abdomen- acute cystitis. PMH includes neuropathy, HTN, DM, mitral regurgitation.  Clinical Impression  Patient presents with generalized weakness, impaired sensation, impaired balance and impaired mobility s/p above. Pt was admitted from [facility] and does not want to return there. Was performing ADLs with minimal assist for washing back/buttocks and walking with RW PTA. Today, pt requires mod A for bed mobility, max A for standing and mod A to transfer to chair due to imbalance and marked weakness in LEs. Highly motivated to return to [facility]. Would benefit from CIR to maximize independence and mobility prior to return home. Will follow acutely.     Follow Up Recommendations CIR;Supervision for mobility/OOB;Supervision/Assistance - 24 hour    Equipment Recommendations  None recommended by PT    Recommendations for Other Services Rehab consult;OT consult     Precautions / Restrictions Precautions Precautions: Fall Restrictions Weight Bearing Restrictions: No      Mobility  Bed Mobility Overal bed mobility: Needs Assistance Bed Mobility: Rolling;Sidelying to Sit Rolling: Min guard Sidelying to sit: HOB elevated;Mod assist       General bed mobility comments: Assist with LEs to get to EOB, able to elevate trunk with use of rail.  Transfers Overall transfer level: Needs assistance Equipment used: Rolling walker (2 wheeled) Transfers: Sit to/from [facility] Sit to Stand: Max assist Stand pivot transfers: Mod assist       General transfer comment: assist to power to standing with use of momentum, cues for foot/hand placement and blocking bil feet from sliding.  Stood from EOB x3, cues for upright posture at hips and trunk. Able to take a few steps to get to chair with assist for RW management, LE placement, balance.  Ambulation/Gait                Stairs            Wheelchair Mobility    Modified Rankin (Stroke Patients Only)       Balance Overall balance assessment: Needs assistance Sitting-balance support: Feet supported;No upper extremity supported Sitting balance-Leahy Scale: Fair     Standing balance support: During functional activity Standing balance-Leahy Scale: Poor Standing balance comment: Requires UE support and external support; marching in place forwards/backwards with Min A;bil knee instability.                             Pertinent Vitals/Pain Pain Assessment: Faces Faces Pain Scale: Hurts a little bit Pain Location: headache Pain Descriptors / Indicators: Headache Pain Intervention(s): Monitored during session;Repositioned    Home Living Family/patient expects to be discharged to:: Skilled nursing facility                      Prior Function Level of Independence: Needs assistance   Gait / Transfers Assistance Needed: uses RW for ambulation with assist.  ADL's / Homemaking Assistance Needed: Assist with washing back/buttocks  Comments: reports she was only gettign 15 minutes of therapy daily; her therapists at [facility] told her it was due to insurance purposes.     Hand Dominance        Extremity/Trunk Assessment   Upper Extremity Assessment Upper Extremity Assessment:  Defer to OT evaluation    Lower Extremity Assessment Lower Extremity Assessment: RLE deficits/detail;LLE deficits/detail RLE Deficits / Details: Grossly ~2-/5 hip flexion, 3/5 knee extension, limited ankle AROM RLE Sensation: decreased light touch;history of peripheral neuropathy LLE Deficits / Details: Grossly ~1/5 hip flexion, 3/5 knee extension, limited ankle AROM. LLE Sensation: decreased light  touch;history of peripheral neuropathy    Cervical / Trunk Assessment Cervical / Trunk Assessment: Normal  Communication   Communication: No difficulties  Cognition Arousal/Alertness: Awake/alert Behavior During Therapy: WFL for tasks assessed/performed Overall Cognitive Status: Within Functional Limits for tasks assessed                                        General Comments General comments (skin integrity, edema, etc.): VSS on RA.    Exercises     Assessment/Plan    PT Assessment Patient needs continued PT services  PT Problem List Decreased strength;Decreased mobility;Decreased balance;Impaired sensation;Decreased range of motion       PT Treatment Interventions Therapeutic activities;Gait training;Therapeutic exercise;Patient/family education;Balance training;Neuromuscular re-education;Functional mobility training;DME instruction    PT Goals (Current goals can be found in the Care Plan section)  Acute Rehab PT Goals Patient Stated Goal: to be able to walk again PT Goal Formulation: With patient Time For Goal Achievement: 03/23/20 Potential to Achieve Goals: Good    Frequency Min 3X/week   Barriers to discharge        Co-evaluation               AM-PAC PT "6 Clicks" Mobility  Outcome Measure Help needed turning from your back to your side while in a flat bed without using bedrails?: A Little Help needed moving from lying on your back to sitting on the side of a flat bed without using bedrails?: A Lot Help needed moving to and from a bed to a chair (including a wheelchair)?: A Lot Help needed standing up from a chair using your arms (e.g., wheelchair or bedside chair)?: Total Help needed to walk in hospital room?: A Lot Help needed climbing 3-5 steps with a railing? : Total 6 Click Score: 11    End of Session Equipment Utilized During Treatment: Gait belt Activity Tolerance: Patient tolerated treatment well Patient left: in chair;with  call bell/phone within reach;with chair alarm set;with nursing/sitter in room Nurse Communication: Mobility status PT Visit Diagnosis: Muscle weakness (generalized) (M62.81);Unsteadiness on feet (R26.81);Difficulty in walking, not elsewhere classified (R26.2)    Time: 1008-1040 PT Time Calculation (min) (ACUTE ONLY): 32 min   Charges:   PT Evaluation $PT Eval Moderate Complexity: 1 Mod PT Treatments $Therapeutic Activity: 8-22 mins        Marisa Severin, PT, DPT Acute Rehabilitation Services Pager [phone] Office [phone]      Marguarite Arbour A Talayia Hjort 03/09/2020, 1:30 PM

## 2020-03-09 NOTE — Progress Notes (Signed)
Occupational Therapy Evaluation  Patient with functional deficits listed below impacting safety and independence with self care. Patient total A to pull up socks in sitting, mod A with sit to stand requiring 2 attempts due to LEs sliding forward and heavy mod A to stand pivot with rolling walker to BSC. Patient unable to have bowel movement, returned to recliner chair with mod A x1 and cues for body mechanics. Recommend patient receive further inpatient rehab prior to discharge to maximize patient safety and independence with ADLs and functional transfers.    03/09/20 1400  OT Visit Information  Last OT Received On 03/09/20  Assistance Needed +1 (+2 to progress)  History of Present Illness Patient is a 53 y/o female who presents from [facility] with AMS; found to have hypoglycemia, metabolic encephalopathy and sepsis secondary to UTI. CT abdomen- acute cystitis. PMH includes neuropathy, HTN, DM, mitral regurgitation.  Precautions  Precautions Fall  Restrictions  Weight Bearing Restrictions No  Home Living  Family/patient expects to be discharged to: Skilled nursing facility  Prior Function  Level of Independence Needs assistance  Gait / Transfers Assistance Needed uses RW for ambulation with assist.  ADL's / [place] with washing back/buttocks  Comments reports she was only getting 15 minutes of therapy daily; her therapists at [facility] told her it was due to insurance purposes.  Communication  Communication No difficulties  Pain Assessment  Pain Assessment Faces  Faces Pain Scale 0  Cognition  Arousal/Alertness Awake/alert  Behavior During Therapy WFL for tasks assessed/performed  Overall Cognitive Status Within Functional Limits for tasks assessed  Upper Extremity Assessment  Upper Extremity Assessment Generalized weakness  Lower Extremity Assessment  Lower Extremity Assessment Defer to PT evaluation  Cervical / Trunk Assessment  Cervical / Trunk  Assessment Normal  ADL  Overall ADL's  Needs assistance/impaired  Grooming Set up;Sitting  Upper Body Bathing Minimal assistance;Sitting  Lower Body Bathing Maximal assistance;Sitting/lateral leans;Sit to/from stand  Upper Body Dressing  Minimal assistance;Sitting  Lower Body Dressing Total assistance;Sit to/from stand;Sitting/lateral leans  Lower Body Dressing Details (indicate cue type and reason) to pull up socks   Toilet Transfer Moderate assistance;Stand-pivot;Cueing for safety;Cueing for sequencing;BSC;RW  Toilet Transfer Details (indicate cue type and reason) heavy mod A with decreased strength, stability  Toileting- Clothing Manipulation and Hygiene Total assistance;Sitting/lateral lean;Sit to/from stand  Functional mobility during ADLs Moderate assistance;Cueing for safety;Cueing for sequencing;Rolling walker  General ADL Comments patient requiring increased assistance with self care tasks due to decreased activity tolerance, balance, strength, safety   Bed Mobility  General bed mobility comments in recliner  Transfers  Overall transfer level Needs assistance  Equipment used Rolling walker (2 wheeled)  Transfers Sit to/from Stand;Stand Pivot Transfers  Sit to Stand Mod assist  Stand pivot transfers Mod assist  General transfer comment assist to power up to standing with cues for momentum, cues for LE positioning due to LEs sliding forward and mod A to safely complete transfer to bedside commode and back to recliner, limited eccentric control  Balance  Overall balance assessment Needs assistance  Sitting-balance support Feet supported;No upper extremity supported  Sitting balance-Leahy Scale Fair  Standing balance support During functional activity;Bilateral upper extremity supported  Standing balance-Leahy Scale Poor  Standing balance comment Requires UE support and external support;   OT - End of Session  Equipment Utilized During Treatment Rolling walker;Gait belt  Activity  Tolerance Patient tolerated treatment well  Patient left in chair;with call bell/phone within reach;with chair alarm set  Nurse Communication Mobility status (IV pump)  OT Assessment  OT Recommendation/Assessment Patient needs continued OT Services  OT Visit Diagnosis Unsteadiness on feet (R26.81);Other abnormalities of gait and mobility (R26.89);Muscle weakness (generalized) (M62.81)  OT Problem List Decreased strength;Decreased activity tolerance;Impaired balance (sitting and/or standing);Decreased safety awareness;Obesity  OT Plan  OT Frequency (ACUTE ONLY) Min 2X/week  OT Treatment/Interventions (ACUTE ONLY) Self-care/ADL training;Therapeutic exercise;Energy conservation;DME and/or AE instruction;Therapeutic activities;Patient/family education;Balance training  AM-PAC OT "6 Clicks" Daily Activity Outcome Measure (Version 2)  Help from another person eating meals? 4  Help from another person taking care of personal grooming? 3  Help from another person toileting, which includes using toliet, bedpan, or urinal? 2  Help from another person bathing (including washing, rinsing, drying)? 2  Help from another person to put on and taking off regular upper body clothing? 3  Help from another person to put on and taking off regular lower body clothing? 1  6 Click Score 15  OT Recommendation  Follow Up Recommendations CIR;Supervision/Assistance - 24 hour  OT Equipment  (TBD)  Individuals Consulted  Consulted and Agree with Results and Recommendations Patient  Acute Rehab OT Goals  Patient Stated Goal to be able to walk again  OT Goal Formulation With patient  Time For Goal Achievement 03/23/20  Potential to Achieve Goals Good  OT Time Calculation  OT Start Time (ACUTE ONLY) 1153  OT Stop Time (ACUTE ONLY) 1220  OT Time Calculation (min) 27 min  OT General Charges  $OT Visit 1 Visit  OT Evaluation  $OT Eval Low Complexity 1 Low  OT Treatments  $Self Care/Home Management  8-22 mins   Written Expression  Dominant Hand Right   Delbert Phenix OT OT pager: [phone]

## 2020-03-09 NOTE — Progress Notes (Signed)
PROGRESS NOTE  [name] [name] GGY:[id number] DOB: May 13, 1966 DOA: 03/07/2020 PCP: Biagio Borg, MD  HPI/Recap of past 24 hours: [name] [name] is a 54 y.o. female with medical history significant of hypertension, insulin-dependent type 2 diabetes, diabetic neuropathy, ESRD on hemodialysis MWF, NAFLD, chronic bilateral lower extremity weakness, recurrent ESBL Klebsiella UTI, history of pubic symphysis septic arthritis and osteomyelitis and extraperitoneal left thigh abscess status post I & D of left groin on 11/26/2019 brought by EMS from SNF to the emergency department for evaluation of AMS.  Patient tells me that she went to [facility] ED on 9/1 for evaluation of UTI.  Patient was complaining of foul-smelling urine with purulent material/discharge when wiping.  She had CT abdomen shows ill-defined fluid collection along the inferior aspect of pubic symphysis which continues to extend along the left adductor musculature, rectal wall thickening and perirectal stranding, circumferential bladder wall thickening for which  IR was consulted who recommended supportive care and no drainable collection identified.  On the day of presentation, at rehab patient was noted to be confused and unresponsive.  EMS was called-her initial CBG was noted to be 44 and improved to 190 after glucagon.  Blood pressure was noted to be 88/45 and was given a bolus of normal saline and BP increased to 106/68.  Patient was brought to ER for further evaluation and management.  ED Course: UA positive for leukocyte, RBC, WBC and bacteria, PT/INR, APTT, T4, TSH, ammonia level: WNL, lipase: 61, COVID-19 negative, troponin: Negative, CT head negative for acute findings CT abdomen/pelvis concerning for acute cystitis.  Chest x-ray negative for acute findings.  Patient received cefepime, vancomycin, Flagyl and Nalaxone in the ED.  HD completed on 03/08/2020.  03/09/20: Seen and examined.  Reports feeling better this  morning.  Requested not to go back to her previous SNF facility.   Assessment/Plan: Principal Problem:   Sepsis ([place]) Active Problems:   DM (diabetes mellitus), type 2, uncontrolled ([place])   Diabetic neuropathy ([place])   Essential hypertension   ESRD (end stage renal disease) on dialysis ([place])   Hyperkalemia   Acute cystitis   Acute metabolic encephalopathy  Sepsis secondary to Enterococcus faecalis UTI: -Patient has history of recurrent ESBL Klebsiella UTI.  UA is positive for leukocyte, RBC, WBC and bacteria.  CT abdomen/pelvis concerning for acute cystitis.  Patient hypotensive, hypothermic, has leukocytosis of 11.6K.  -Received cefepime, vancomycin and Flagyl in the ED Antibiotics deescalated, IV vancomycin and IV Flagyl discontinued on 03/08/2020. Urine culture growing Enterococcus faecalis greater than 100,000 colonies, cefepime discontinued and switched to Unasyn. Blood cultures negative to date, continue to follow  Resolved acute metabolic encephalopathy likely secondary to hypoglycemia versus sepsis: -CT head is negative for acute findings. -TSH, free T4, ammonia level: WNL, COVID-19 negative She is back to her baseline mentation.  ESRD on hemodialysis MWF: Mildly hypervolemic this morning Last hemodialysis was on Wednesday 03/05/20, and 03/08/2020 (in hospital) Patient nephrology's assistance.  Resolved hyperkalemia:  Potassium of 5.3> 4.1. Received a dose of Lokelma.  Hypermagnesemia Serum magnesium 3.1 Electrolytes managed with hemodialysis Repletion nephrology's assistance  Abnormal LFTs, unclear etiology Trend levels Repeat in the morning  Insulin-dependent type 2 diabetes mellitus:  Patient hypoglycemic upon arrival Avoid hypoglycemia Obtain hemoglobin A1c Insulin sliding scale  Essential hypertension:  Hypotensive upon arrival to the ED  Home BP meds-Coreg, amlodipine, hydralazine -Monitor blood pressure closely.   BP is currently not at goal Resume  home BP meds once blood pressure  is back to baseline.  Diabetic neuropathy:  Continue gabapentin  Chronic bilateral lower extremity weakness: -No nerve response in legs on EMG on 10/2018. -PT OT to assess Fall precaution  DVT prophylaxis: Heparin subcu 3 times daily/SCD Code Status: Full code Family Communication:  None at bedside.   Consults called: Nephrology by EDP   Disposition:  Status is: Inpatient    Dispo:  Patient From: [place]  Planned Disposition: [place]  Expected discharge date: 03/10/20  Medically stable for discharge: No, ongoing work-up and treatment of UTI.         Objective: Vitals:   03/08/20 2141 03/09/20 0009 03/09/20 0451 03/09/20 0840  BP: (!) 194/81 (!) 178/79 (!) 175/77 (!) 189/85  Pulse:  78 66 84  Resp:   13 16  Temp:  99.9 F (37.7 C) 98.7 F (37.1 C) 98.4 F (36.9 C)  TempSrc:  Oral Oral Axillary  SpO2:   100% 100%  Weight:   72.6 kg   Height:        Intake/Output Summary (Last 24 hours) at 03/09/2020 1443 Last data filed at 03/09/2020 0836 Gross per 24 hour  Intake 240 ml  Output 0 ml  Net 240 ml   Filed Weights   03/08/20 1225 03/08/20 1624 03/09/20 0451  Weight: 70.8 kg 70.8 kg 72.6 kg    Exam:  . General: 54 y.o. year-old female pleasant well-developed well-nourished no acute distress.  Alert and oriented x3.   . Cardiovascular: Regular rate and rhythm no rubs or gallops. Marland Kitchen Respiratory: Clear to auscultation no wheezes or rales.   . Abdomen: Soft nontender normal bowel sounds present. . Musculoskeletal: Trace edema in lower extremities bilaterally.   Marland Kitchen Psychiatry: Mood is appropriate for condition and setting.  Data Reviewed: CBC: Recent Labs  Lab 03/07/20 1128 03/07/20 1145 03/07/20 1422 03/08/20 1327 03/09/20 0824  WBC 11.6*  --  9.2 11.7* 10.4  NEUTROABS 7.9*  --   --   --   --   HGB 13.2 15.6* 14.1 12.4 13.3  HCT 44.5 46.0 47.3* 41.3 44.6  MCV 85.9  --  86.9 85.9  85.9  PLT 184  --  182 165 465   Basic Metabolic Panel: Recent Labs  Lab 03/07/20 1128 03/07/20 1145 03/07/20 1422 03/08/20 1327 03/09/20 0824  NA 138 137  --  139 138  K 5.3* 5.0  --  4.1 4.1  CL 102 105  --  102 100  CO2 21*  --   --  22 20*  GLUCOSE 166* 159*  --  149* 123*  BUN 63* 57*  --  46* 27*  CREATININE 6.65* 6.70* 6.57* 5.27* 4.11*  CALCIUM 8.7*  --   --  8.1* 8.4*  MG  --   --  3.1*  --   --   PHOS  --   --  8.2*  --   --    GFR: Estimated Creatinine Clearance: 14.1 mL/min (A) (by C-G formula based on SCr of 4.11 mg/dL (H)). Liver Function Tests: Recent Labs  Lab 03/07/20 1128 03/08/20 1327  AST 26 69*  ALT 27 97*  ALKPHOS 108 113  BILITOT 0.5 0.6  PROT 6.7 6.5  ALBUMIN 3.1* 2.9*   Recent Labs  Lab 03/07/20 1128  LIPASE 61*   Recent Labs  Lab 03/07/20 1128  AMMONIA 18   Coagulation Profile: Recent Labs  Lab 03/07/20 1128 03/08/20 1924  INR 1.1 1.1   Cardiac Enzymes: No results for  input(s): CKTOTAL, CKMB, CKMBINDEX, TROPONINI in the last 168 hours. BNP (last 3 results) No results for input(s): PROBNP in the last 8760 hours. HbA1C: Recent Labs    03/09/20 0824  HGBA1C 5.8*   CBG: Recent Labs  Lab 03/08/20 1124 03/08/20 1657 03/08/20 2104 03/09/20 0851 03/09/20 1152  GLUCAP 132* 129* 199* 127* 181*   Lipid Profile: No results for input(s): CHOL, HDL, LDLCALC, TRIG, CHOLHDL, LDLDIRECT in the last 72 hours. Thyroid Function Tests: Recent Labs    03/07/20 1128 03/07/20 1128 03/07/20 1427  TSH 4.109   < > 3.140  FREET4 0.88  --   --    < > = values in this interval not displayed.   Anemia Panel: No results for input(s): VITAMINB12, FOLATE, FERRITIN, TIBC, IRON, RETICCTPCT in the last 72 hours. Urine analysis:    Component Value Date/Time   COLORURINE STRAW (A) 03/07/2020 1123   APPEARANCEUR TURBID (A) 03/07/2020 1123   LABSPEC 1.019 03/07/2020 1123   PHURINE 7.0 03/07/2020 1123   GLUCOSEU NEGATIVE 03/07/2020 1123    GLUCOSEU >=1000 (A) 11/19/2015 1505   HGBUR SMALL (A) 03/07/2020 1123   BILIRUBINUR NEGATIVE 03/07/2020 1123   BILIRUBINUR neg 03/13/2015 1858   KETONESUR NEGATIVE 03/07/2020 1123   PROTEINUR >=300 (A) 03/07/2020 1123   UROBILINOGEN 0.2 11/19/2015 1505   NITRITE NEGATIVE 03/07/2020 1123   LEUKOCYTESUR MODERATE (A) 03/07/2020 1123   Sepsis Labs: @LABRCNTIP (procalcitonin:4,lacticidven:4)  ) Recent Results (from the past 240 hour(s))  Blood Culture (routine x 2)     Status: None (Preliminary result)   Collection Time: 03/07/20 11:03 AM   Specimen: BLOOD  Result Value Ref Range Status   Specimen Description BLOOD RIGHT THUMB  Final   Special Requests   Final    BOTTLES DRAWN AEROBIC AND ANAEROBIC Blood Culture adequate volume   Culture   Final    NO GROWTH 2 DAYS Performed at [facility], [place] [address], [place], [place] [address]    Report Status PENDING  Incomplete  Urine culture     Status: Abnormal (Preliminary result)   Collection Time: 03/07/20 11:23 AM   Specimen: In/Out Cath Urine  Result Value Ref Range Status   Specimen Description IN/OUT CATH URINE  Final   Special Requests NONE  Final   Culture (A)  Final    >=100,000 COLONIES/mL ENTEROCOCCUS FAECALIS SUSCEPTIBILITIES TO FOLLOW Performed at [facility], [place] [address], [place], [place] [address]    Report Status PENDING  Incomplete  Blood Culture (routine x 2)     Status: None (Preliminary result)   Collection Time: 03/07/20 11:28 AM   Specimen: BLOOD  Result Value Ref Range Status   Specimen Description BLOOD REJ  Final   Special Requests   Final    BOTTLES DRAWN AEROBIC AND ANAEROBIC Blood Culture adequate volume   Culture   Final    NO GROWTH 2 DAYS Performed at [facility], [place] [address] [place], [place] [address]    Report Status PENDING  Incomplete  SARS Coronavirus 2 by RT PCR (hospital order, performed in [facility] hospital lab) Nasopharyngeal Nasopharyngeal Swab     Status:  None   Collection Time: 03/07/20 11:28 AM   Specimen: Nasopharyngeal Swab  Result Value Ref Range Status   SARS Coronavirus 2 NEGATIVE NEGATIVE Final    Comment: (NOTE) SARS-CoV-2 target nucleic acids are NOT DETECTED.  The SARS-CoV-2 RNA is generally detectable in upper and lower respiratory specimens during the acute phase  of infection. The lowest concentration of SARS-CoV-2 viral copies this assay can detect is 250 copies / mL. A negative result does not preclude SARS-CoV-2 infection and should not be used as the sole basis for treatment or other patient management decisions.  A negative result may occur with improper specimen collection / handling, submission of specimen other than nasopharyngeal swab, presence of viral mutation(s) within the areas targeted by this assay, and inadequate number of viral copies (<250 copies / mL). A negative result must be combined with clinical observations, patient history, and epidemiological information.  Fact Sheet for Patients:   StrictlyIdeas.no  Fact Sheet for Healthcare Providers: BankingDealers.co.za  This test is not yet approved or  cleared by the [place] FDA and has been authorized for detection and/or diagnosis of SARS-CoV-2 by FDA under an Emergency Use Authorization (EUA).  This EUA will remain in effect (meaning this test can be used) for the duration of the COVID-19 declaration under Section 564(b)(1) of the Act, 21 U.S.C. section 360bbb-3(b)(1), unless the authorization is terminated or revoked sooner.  Performed at [facility], [place] [address] [place], [place] [address]   MRSA PCR Screening     Status: None   Collection Time: 03/08/20  6:14 AM   Specimen: Nasopharyngeal  Result Value Ref Range Status   MRSA by PCR NEGATIVE NEGATIVE Final    Comment:        The GeneXpert MRSA Assay (FDA approved for NASAL specimens only), is one component of a comprehensive  MRSA colonization surveillance program. It is not intended to diagnose MRSA infection nor to guide or monitor treatment for MRSA infections. Performed at [facility], [place] [address], [place], [place] [address]       Studies: No results found.  Scheduled Meds: . carvedilol  25 mg Oral BID  . Chlorhexidine Gluconate Cloth  6 each Topical Daily  . Chlorhexidine Gluconate Cloth  6 each Topical Q0600  . gabapentin  200 mg Oral QHS  . heparin  5,000 Units Subcutaneous Q8H  . insulin aspart  0-5 Units Subcutaneous QHS  . insulin aspart  0-6 Units Subcutaneous TID WC    Continuous Infusions: . ampicillin-sulbactam (UNASYN) IV 3 g (03/09/20 0836)     LOS: 2 days     Kayleen Memos, MD [facility] Pager [phone]  If 7PM-7AM, please contact night-coverage [email or web address] Password [facility] 03/09/2020, 2:43 PM

## 2020-03-09 NOTE — Progress Notes (Signed)
[facility] Progress Note  Subjective: no new c/o, doesn't feel great but better overall  Vitals:   03/09/20 0451 03/09/20 0840 03/09/20 1155 03/09/20 1652  BP: (!) 175/77 (!) 189/85 (!) 193/76 (!) 198/85  Pulse: 66 84 69 73  Resp: 13 16 16 16   Temp: 98.7 F (37.1 C) 98.4 F (36.9 C)  98.8 F (37.1 C)  TempSrc: Oral Axillary  Oral  SpO2: 100% 100% 100% 97%  Weight: 72.6 kg     Height:        Exam: Gen alert, no distress, on the phone No jvd or bruits Chest clear bilat no rales or wheezing RRR +2/6 sem, no RG Abd soft ntnd no mass or ascites +bs Ext + hip / thigh/ pretib edema 1-2+ Neuro is alert, Ox 3 , nf L chest TDC    Home meds:  - norvasc 10/ coreg 25 bid/ hydralazine 10 qid prn/ isordil 20 tid/ lipitor 20/ lasix 20 qd/ losartan 25 qd  - gabapentin 100 qdoxy IR qid prn/ elavil 25-50 hs prn/ lyrica 150 bid/ tramadol 100 qid prn  - prilosec 20 qd/ kdur 10 qd  - insulin regular 14-18 u qam and qhs/ glargine 13u hs  - prn's/ vitamins/ supplements/ eyedrops     [facility]: [facility]  MWF   3.5h   350/700  70.5kg  L IJ TDC  Hep 5000 then 500/hr   Assessment/ Plan: 1. UTI - enterococcus faecalis per urine culture. Looks better, AMS resolved. Per pmd.  2. ESRD - on HD MWF. Had HD here yesterday for missed Friday. Next HD tomorrow.  3. BP/ vol - BP'sup, wt's up 2kg but has more on exam, not symptomatic. UF 2-3 L as tol w/ hd tomorrow.  4. DM2 insulin- dependent - w/ sig complications 5. HTN - back on home coreg, will resume her losartan/ norvasc today 6. Anemia ckd - Hb 12-13, don't have esa data from [facility] yet 7. MBD ckd - cont binders    Rob Anamika Kueker 03/09/2020, 5:26 PM   Recent Labs  Lab 03/07/20 1145 03/07/20 1422 03/08/20 1327 03/09/20 0824  K   < >  --  4.1 4.1  BUN   < >  --  46* 27*  CREATININE   < > 6.57* 5.27* 4.11*  CALCIUM   < >  --  8.1* 8.4*  PHOS  --  8.2*  --   --   HGB   < > 14.1 12.4 13.3   < > = values  in this interval not displayed.   Inpatient medications: . carvedilol  25 mg Oral BID  . Chlorhexidine Gluconate Cloth  6 each Topical Daily  . Chlorhexidine Gluconate Cloth  6 each Topical Q0600  . gabapentin  200 mg Oral QHS  . heparin  5,000 Units Subcutaneous Q8H  . insulin aspart  0-5 Units Subcutaneous QHS  . insulin aspart  0-6 Units Subcutaneous TID WC   . ampicillin-sulbactam (UNASYN) IV Stopped (03/09/20 0908)   ondansetron **OR** ondansetron (ZOFRAN) IV

## 2020-03-10 LAB — URINE CULTURE: Culture: >=100000 — AB

## 2020-03-10 LAB — COMPREHENSIVE METABOLIC PANEL
ALT: 104 U/L — ABNORMAL HIGH (ref 0–44)
AST: 86 U/L — ABNORMAL HIGH (ref 15–41)
Albumin: 2.8 g/dL — ABNORMAL LOW (ref 3.5–5.0)
Alkaline Phosphatase: 116 U/L (ref 38–126)
Anion gap: 16 — ABNORMAL HIGH (ref 5–15)
BUN: 48 mg/dL — ABNORMAL HIGH (ref 6–20)
CO2: 22 mmol/L (ref 22–32)
Calcium: 8.5 mg/dL — ABNORMAL LOW (ref 8.9–10.3)
Chloride: 102 mmol/L (ref 98–111)
Creatinine, Ser: 5.63 mg/dL — ABNORMAL HIGH (ref 0.44–1.00)
GFR calc Af Amer: 9 mL/min — ABNORMAL LOW (ref >60)
GFR calc non Af Amer: 8 mL/min — ABNORMAL LOW (ref >60)
Glucose, Bld: 98 mg/dL (ref 70–99)
Potassium: 5.3 mmol/L — ABNORMAL HIGH (ref 3.5–5.1)
Sodium: 140 mmol/L (ref 135–145)
Total Bilirubin: 0.7 mg/dL (ref 0.3–1.2)
Total Protein: 6.5 g/dL (ref 6.5–8.1)

## 2020-03-10 LAB — CBC WITH DIFFERENTIAL/PLATELET
Abs Immature Granulocytes: 0.02 10*3/uL (ref 0.00–0.07)
Basophils Absolute: 0.1 10*3/uL (ref 0.0–0.1)
Basophils Relative: 1 %
Eosinophils Absolute: 0.3 10*3/uL (ref 0.0–0.5)
Eosinophils Relative: 4 %
HCT: 42.8 % (ref 36.0–46.0)
Hemoglobin: 13.3 g/dL (ref 12.0–15.0)
Immature Granulocytes: 0 %
Lymphocytes Relative: 37 %
Lymphs Abs: 3 10*3/uL (ref 0.7–4.0)
MCH: 26.6 pg (ref 26.0–34.0)
MCHC: 31.1 g/dL (ref 30.0–36.0)
MCV: 85.6 fL (ref 80.0–100.0)
Monocytes Absolute: 0.8 10*3/uL (ref 0.1–1.0)
Monocytes Relative: 10 %
Neutro Abs: 3.8 10*3/uL (ref 1.7–7.7)
Neutrophils Relative %: 48 %
Platelets: 159 10*3/uL (ref 150–400)
RBC: 5 MIL/uL (ref 3.87–5.11)
RDW: 18.2 % — ABNORMAL HIGH (ref 11.5–15.5)
WBC: 7.9 10*3/uL (ref 4.0–10.5)
nRBC: 0 % (ref 0.0–0.2)

## 2020-03-10 LAB — PHOSPHORUS: Phosphorus: 6.9 mg/dL — ABNORMAL HIGH (ref 2.5–4.6)

## 2020-03-10 LAB — GLUCOSE, CAPILLARY
Glucose-Capillary: 182 mg/dL — ABNORMAL HIGH (ref 70–99)
Glucose-Capillary: 95 mg/dL (ref 70–99)
Glucose-Capillary: 134 mg/dL — ABNORMAL HIGH (ref 70–99)
Glucose-Capillary: 183 mg/dL — ABNORMAL HIGH (ref 70–99)

## 2020-03-10 MED ORDER — ACETAMINOPHEN 325 MG PO TABS: 650.0000 mg | ORAL_TABLET | Freq: Four times a day (QID) | ORAL | Status: DC | PRN

## 2020-03-10 MED ORDER — AMOXICILLIN 500 MG PO CAPS
500.0000 mg | ORAL_CAPSULE | Freq: Every day | ORAL | Status: DC
  Administered 2020-03-10 – 2020-03-14 (×5): 500 mg via ORAL
  Filled 2020-03-10 (×5): qty 1

## 2020-03-10 MED ORDER — SODIUM ZIRCONIUM CYCLOSILICATE 10 G PO PACK
10.0000 g | PACK | Freq: Once | ORAL | Status: AC
  Administered 2020-03-10: 10 g via ORAL
  Filled 2020-03-10: qty 1

## 2020-03-10 MED ORDER — DIPHENHYDRAMINE HCL 50 MG/ML IJ SOLN: 12.5000 mg | Freq: Three times a day (TID) | INTRAMUSCULAR | Status: DC | PRN

## 2020-03-10 NOTE — TOC Initial Note (Addendum)
Transition of Care [facility]) - Initial/Assessment Note    Patient Details  Name: [name] [name] MRN: [id number] Date of Birth: 1966-01-21  [facility] of [facility]) CM/SW Contact:    Trula Ore, [place] Phone Number: 03/10/2020, 4:33 PM  Clinical Narrative:                 CSW called [facility] to verify if patient came from there. Irine Seal with [facility] says that patient came from there and is a long term resident there. CSW spoke with patient who says that she was short term rehab at [facility]. CSW let patient know that [facility] say she was Long term resident there.Patient confirmed with CSW that she was there for short term rehab not a long term resident. P[place] says she does not want to return to [facility].Patient wants to go to [facility]. CSW sent initial referral to [facility]. CSW will follow up with [facility] in the morning. All concerns addressed with patient.  CSW will continue to follow.  Expected Discharge Plan: Skilled Nursing Facility Barriers to Discharge: Continued Medical Work up   Patient Goals and CMS Choice Patient states their goals for this hospitalization and ongoing recovery are:: to go to SNF      Expected Discharge Plan and Services Expected Discharge Plan: [place]       Living arrangements for the past 2 months: [place]                                      Prior Living Arrangements/Services Living arrangements for the past 2 months: [place] Lives with:: Self, Facility Resident Patient language and need for interpreter reviewed:: Yes Do you feel safe going back to the place where you live?: Yes      Need for Family Participation in Patient Care: Yes (Comment) Care giver support system in place?: Yes (comment)   Criminal Activity/Legal Involvement Pertinent to Current Situation/Hospitalization: No - Comment as needed  Activities of Daily Living   ADL Screening (condition at  time of admission) Patient's cognitive ability adequate to safely complete daily activities?: Yes Is the patient deaf or have difficulty hearing?: No Does the patient have difficulty seeing, even when wearing glasses/contacts?: No Does the patient have difficulty concentrating, remembering, or making decisions?: No Patient able to express need for assistance with ADLs?: Yes Does the patient have difficulty dressing or bathing?: Yes Independently performs ADLs?: No Communication: Independent Dressing (OT): Needs assistance Is this a change from baseline?: Pre-admission baseline Grooming: Needs assistance Is this a change from baseline?: Pre-admission baseline Feeding: Independent Bathing: Needs assistance Is this a change from baseline?: Pre-admission baseline Toileting: Needs assistance Is this a change from baseline?: Pre-admission baseline In/Out Bed: Needs assistance Is this a change from baseline?: Pre-admission baseline Walks in Home: Needs assistance Is this a change from baseline?: Pre-admission baseline Does the patient have difficulty walking or climbing stairs?: Yes Weakness of Legs: Both Weakness of Arms/Hands: None  Permission Sought/Granted Permission sought to share information with : Case Manager, Family Supports, Customer service manager                Emotional Assessment       Orientation: : Oriented to Self, Oriented to Place, Oriented to  Time, Oriented to Situation Alcohol / Substance Use: Not Applicable Psych Involvement: No (comment)  Admission diagnosis:  Hypoglycemia [E16.2]  Acute cystitis without hematuria [N30.00] Hypothermia, initial encounter [T68.XXXA] Sepsis ([place]) [A41.9] Altered mental status, unspecified altered mental status type [R41.82] Patient Active Problem List   Diagnosis Date Noted  . Sepsis ([place]) 03/07/2020  . ESRD (end stage renal disease) on dialysis ([place]) 03/07/2020  . Hyperkalemia 03/07/2020  . Acute cystitis  03/07/2020  . Acute metabolic encephalopathy 24/58/0998  . 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 Diabetes mellitus with complication ([place])   . Intractable headache   . Syncope 07/28/2015  . Chronic headaches 07/28/2015  . Chest pain 05/22/2015  . Urinary frequency 03/13/2015  . Easy bruising 10/09/2014  . Encounter for preventative adult health care exam with abnormal findings 01/11/2014  . Chronic constipation 01/11/2014  . GERD (gastroesophageal reflux disease) 01/11/2014  . Helicobacter positive gastritis 12/06/2013  . Dizziness and giddiness 10/12/2013  . Essential hypertension 09/19/2013  . Back pain 06/11/2013  . Lumbosacral spondylosis without myelopathy 02/20/2013  . Diabetic neuropathy ([place]) 01/09/2013  . DM (diabetes mellitus), type 2, uncontrolled ([place]) 10/02/2012  . MITRAL REGURGITATION, mild 10/31/2009  . PLEURAL EFFUSION, RIGHT after pyelonephritis in April 2011 (no intervention) 10/20/2009   PCP:  Biagio Borg, MD Pharmacy:   [facility] [place], [place] [address] Phone: [phone] Fax: [phone]  [place], [place] AT [facility] OF [place] [facility] [place] [address] Phone: [phone] Fax: [phone]     Social Determinants of Health (SDOH) Interventions    Readmission Risk Interventions No flowsheet data found.

## 2020-03-10 NOTE — Progress Notes (Signed)
Inpatient Rehab Admissions Coordinator Note:   Per therapy recommendations, pt was screened for CIR candidacy by Clemens Catholic, [place] CCC-SLP. At this time, Pt. Does not demonstrate medical necessity for  CIR and her insurance is unlikely to approve a CIR admission.   Clemens Catholic, [place], [place] Admissions Coordinator  [phone] ([place]) [phone] (office)

## 2020-03-10 NOTE — Progress Notes (Signed)
[facility] Progress Note   Subjective:   Patient seen in room. Tired, did not sleep well. Reports some nausea this AM but no vomiting or diarrhea. Also reports swelling in her legs. Denies CP, palpitations, dizziness, SOB. No fever/chills.  Objective Vitals:   03/09/20 1652 03/10/20 0027 03/10/20 0532 03/10/20 0816  BP: (!) 198/85 (!) 168/83 (!) 172/84 (!) 162/73  Pulse: 73 74 64 66  Resp: 16 16 15 14   Temp: 98.8 F (37.1 C) 98.7 F (37.1 C) 98.9 F (37.2 C) 98.4 F (36.9 C)  TempSrc: Oral Oral Oral Oral  SpO2: 97% 99% 100% 100%  Weight:   73.4 kg   Height:       Physical Exam General: Well developed female, alert and in NAD Heart: RRR, no murmurs, rubs or gallops Lungs: CTA bilaterally without wheezing, rhonchi or rales Abdomen: Soft, non-tender, non-distended, +BS Extremities: 2+ edema bilateral lower extremities Dialysis Access:  L chest TDC, no erythema/drainage  Additional Objective Labs: Basic Metabolic Panel: Recent Labs  Lab 03/07/20 1128 03/07/20 1128 03/07/20 1145 03/07/20 1145 03/07/20 1422 03/08/20 1327 03/09/20 0824  NA 138   < > 137  --   --  139 138  K 5.3*   < > 5.0  --   --  4.1 4.1  CL 102   < > 105  --   --  102 100  CO2 21*  --   --   --   --  22 20*  GLUCOSE 166*   < > 159*  --   --  149* 123*  BUN 63*   < > 57*  --   --  46* 27*  CREATININE 6.65*   < > 6.70*   < > 6.57* 5.27* 4.11*  CALCIUM 8.7*  --   --   --   --  8.1* 8.4*  PHOS  --   --   --   --  8.2*  --   --    < > = values in this interval not displayed.   Liver Function Tests: Recent Labs  Lab 03/07/20 1128 03/08/20 1327  AST 26 69*  ALT 27 97*  ALKPHOS 108 113  BILITOT 0.5 0.6  PROT 6.7 6.5  ALBUMIN 3.1* 2.9*   Recent Labs  Lab 03/07/20 1128  LIPASE 61*   CBC: Recent Labs  Lab 03/07/20 1128 03/07/20 1145 03/07/20 1422 03/08/20 1327 03/09/20 0824  WBC 11.6*   < > 9.2 11.7* 10.4  NEUTROABS 7.9*  --   --   --   --   HGB 13.2   < > 14.1 12.4  13.3  HCT 44.5   < > 47.3* 41.3 44.6  MCV 85.9  --  86.9 85.9 85.9  PLT 184   < > 182 165 174   < > = values in this interval not displayed.   Blood Culture    Component Value Date/Time   SDES BLOOD REJ 03/07/2020 1128   SPECREQUEST  03/07/2020 1128    BOTTLES DRAWN AEROBIC AND ANAEROBIC Blood Culture adequate volume   CULT  03/07/2020 1128    NO GROWTH 3 DAYS Performed at [facility], [place] [address], [place], [place] [address]    REPTSTATUS PENDING 03/07/2020 1128    CBG: Recent Labs  Lab 03/09/20 0851 03/09/20 1152 03/09/20 1650 03/09/20 2134 03/10/20 0814  GLUCAP 127* 181* 178* 168* 182*   Medications: . ampicillin-sulbactam (UNASYN) IV Stopped (03/09/20 2210)   . amLODipine  10 mg Oral Daily  . carvedilol  25 mg Oral BID  . Chlorhexidine Gluconate Cloth  6 each Topical Daily  . Chlorhexidine Gluconate Cloth  6 each Topical Q0600  . gabapentin  200 mg Oral QHS  . heparin  5,000 Units Subcutaneous Q8H  . insulin aspart  0-5 Units Subcutaneous QHS  . insulin aspart  0-6 Units Subcutaneous TID WC  . losartan  25 mg Oral Daily    Dialysis Orders: [facility] MWF 3.5h 350/700 70.5kg L IJ TDC Hep 5000 then 500/hr   Assessment/Plan: 1. UTI - enterococcus faecalis per urine culture. On unasyn, AMS resolved. Per pmd.  2. ESRD - on HD MWF. Had HD here yesterday for missed Friday. Next HD today, tapering down volume as below. 3. BP/ vol -BP elevated and 3kg over dry weight with edema on exam. UF 2-3 L as tolerated today. 4. DM2 insulin- dependent - w/ sig complications  5. HTN - back on home coreg,  losartan/ norvasc resumed 9/5, expect further improvement with volume reduction. 6. Anemia ckd - Hb 13.3, no ESA indicated at this time.  7. MBD ckd - Calcium controlled, phos 8.2. Does not appear to be on a binder, repeat phos and start binder if still elevated.   Anice Paganini, PA-C 03/10/2020, 9:34 AM  [facility] Pager: [phone]

## 2020-03-10 NOTE — NC FL2 (Signed)
[place] LEVEL OF CARE SCREENING TOOL     IDENTIFICATION  Patient Name: [name] [name] Birthdate: 07-07-1965 Sex: female Admission Date (Current Location): 03/07/2020  [facility] and [place] Number:  Herbalist and Address:  The [place], [place] [address] [place], [place] [address]      Provider Number: [id number]  Attending Physician Name and Address:  Kayleen Memos, DO  Relative Name and Phone Number:  Corene Cornea [phone]    Current Level of Care: Hospital Recommended Level of Care: [place] Prior Approval Number:    Date Approved/Denied:   PASRR Number: [id number]  Discharge Plan: SNF    Current Diagnoses: Patient Active Problem List   Diagnosis Date Noted  . Sepsis ([place]) 03/07/2020  . ESRD (end stage renal disease) on dialysis ([place]) 03/07/2020  . Hyperkalemia 03/07/2020  . Acute cystitis 03/07/2020  . Acute metabolic encephalopathy 43/32/9518  . 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 Diabetes mellitus with complication ([place])   . Intractable headache   . Syncope 07/28/2015  . Chronic headaches 07/28/2015  . Chest pain 05/22/2015  . Urinary frequency 03/13/2015  . Easy bruising 10/09/2014  . Encounter for preventative adult health care exam with abnormal findings 01/11/2014  . Chronic constipation 01/11/2014  . GERD (gastroesophageal reflux disease) 01/11/2014  . Helicobacter positive gastritis 12/06/2013  . Dizziness and giddiness 10/12/2013  . Essential hypertension 09/19/2013  . Back pain 06/11/2013  . Lumbosacral spondylosis without myelopathy 02/20/2013  . Diabetic neuropathy ([place]) 01/09/2013  . DM (diabetes mellitus), type 2, uncontrolled ([place]) 10/02/2012  . MITRAL REGURGITATION, mild 10/31/2009  . PLEURAL EFFUSION, RIGHT after pyelonephritis in April 2011 (no intervention) 10/20/2009    Orientation RESPIRATION BLADDER Height & Weight     Self, Time, Situation, Place  Normal Continent  Weight: 161 lb 13.1 oz (73.4 kg) Height:  5' (152.4 cm)  BEHAVIORAL SYMPTOMS/MOOD NEUROLOGICAL BOWEL NUTRITION STATUS      Continent Diet (See Discharge Summary)  AMBULATORY STATUS COMMUNICATION OF NEEDS Skin   Limited Assist Verbally Other (Comment) (PI-Stage 1,Intact skin with non-blanchable redness of a localized area usually over a bony prominence,Clean;Dry;Pink)                       Personal Care Assistance Level of Assistance  Bathing, Feeding, Dressing Bathing Assistance: Limited assistance Feeding assistance: Independent (able to feed self;Cardiac) Dressing Assistance: Limited assistance     Functional Limitations Info  Sight, Hearing, Speech Sight Info: Impaired Hearing Info: Adequate Speech Info: Adequate    SPECIAL CARE FACTORS FREQUENCY  PT (By licensed PT), OT (By licensed OT)     PT Frequency: 5x min weekly OT Frequency: 5x min weekly            Contractures Contractures Info: Not present    Additional Factors Info  Code Status, Allergies, Insulin Sliding Scale Code Status Info: FULL Allergies Info: Amitriptyline,Ciprofloxacin,Semaglutide,Tramadol,Tylenol   Insulin Sliding Scale Info: insulin aspart (novoLOG) injection 0-5 Units daily at bedtime,insulin aspart (novoLOG) injection 0-6 Units 3 times daily with meals       Current Medications (03/10/2020):  This is the current hospital active medication list Current Facility-Administered Medications  Medication Dose Route Frequency Provider Last Rate Last Admin  . amLODipine (NORVASC) tablet 10 mg  10 mg Oral Daily Roney Jaffe, MD   10 mg at 03/09/20 1755  . amoxicillin (AMOXIL) capsule 500 mg  500 mg Oral q1800 Christy Gentles, RPH      .  carvedilol (COREG) tablet 25 mg  25 mg Oral BID Irene Pap N, DO   25 mg at 03/09/20 2137  . Chlorhexidine Gluconate Cloth 2 % PADS 6 each  6 each Topical Daily Pahwani, Rinka R, MD   6 each at 03/10/20 1146  . Chlorhexidine Gluconate Cloth 2 % PADS 6 each  6  each Topical Q0600 Roney Jaffe, MD   6 each at 03/10/20 0530  . gabapentin (NEURONTIN) capsule 200 mg  200 mg Oral QHS Pahwani, Rinka R, MD   200 mg at 03/08/20 2140  . heparin injection 5,000 Units  5,000 Units Subcutaneous Q8H Pahwani, Rinka R, MD   5,000 Units at 03/10/20 1458  . insulin aspart (novoLOG) injection 0-5 Units  0-5 Units Subcutaneous QHS Hall, Carole N, DO      . insulin aspart (novoLOG) injection 0-6 Units  0-6 Units Subcutaneous TID WC Irene Pap N, DO   1 Units at 03/10/20 0900  . ondansetron (ZOFRAN) tablet 4 mg  4 mg Oral Q6H PRN Pahwani, Rinka R, MD       Or  . ondansetron (ZOFRAN) injection 4 mg  4 mg Intravenous Q6H PRN Pahwani, Rinka R, MD      . sodium zirconium cyclosilicate (LOKELMA) packet 10 g  10 g Oral Once Kayleen Memos, DO         Discharge Medications: Please see discharge summary for a list of discharge medications.  Relevant Imaging Results:  Relevant Lab Results:   Additional Information [phone]  Trula Ore, LCSWA

## 2020-03-10 NOTE — Progress Notes (Signed)
PROGRESS NOTE  [name] [name] LNL:[id number] DOB: 11/08/1965 DOA: 03/07/2020 PCP: Biagio Borg, MD  HPI/Recap of past 24 hours: [name] [name] is a 54 y.o. female with medical history significant of hypertension, insulin-dependent type 2 diabetes, diabetic neuropathy, ESRD on hemodialysis MWF, NAFLD, chronic bilateral lower extremity weakness, recurrent ESBL Klebsiella UTI, history of pubic symphysis septic arthritis and osteomyelitis and extraperitoneal left thigh abscess status post I & D of left groin on 11/26/2019 brought by EMS from SNF to the emergency department for evaluation of AMS.  Patient tells me that she went to [facility] ED on 9/1 for evaluation of UTI.  Patient was complaining of foul-smelling urine with purulent material/discharge when wiping.  She had CT abdomen shows ill-defined fluid collection along the inferior aspect of pubic symphysis which continues to extend along the left adductor musculature, rectal wall thickening and perirectal stranding, circumferential bladder wall thickening for which  IR was consulted who recommended supportive care and no drainable collection identified.  On the day of presentation, at rehab patient was noted to be confused and unresponsive.  EMS was called-her initial CBG was noted to be 44 and improved to 190 after glucagon.  Blood pressure was noted to be 88/45 and was given a bolus of normal saline and BP increased to 106/68.  Patient was brought to ER for further evaluation and management.  ED Course: UA positive for leukocyte, RBC, WBC and bacteria, PT/INR, APTT, T4, TSH, ammonia level: WNL, lipase: 61, COVID-19 negative, troponin: Negative, CT head negative for acute findings CT abdomen/pelvis concerning for acute cystitis.  Chest x-ray negative for acute findings.  Patient received cefepime, vancomycin, Flagyl and Nalaxone in the ED.  HD completed on 03/08/2020 as she missed her Friday (09/03) session.  Plan HD on  03/10/2020.  03/10/20: Seen and examined.  Reports feeling better this morning.  Requested not to go back to her previous SNF facility.   Assessment/Plan: Principal Problem:   Sepsis ([place]) Active Problems:   DM (diabetes mellitus), type 2, uncontrolled ([place])   Diabetic neuropathy ([place])   Essential hypertension   ESRD (end stage renal disease) on dialysis ([place])   Hyperkalemia   Acute cystitis   Acute metabolic encephalopathy  Sepsis secondary to Enterococcus faecalis UTI:  Urine culture also grew staph lugdunensis likely a contaminant Afebrile no leukocytosis, resolved. Presented with CT abdomen/pelvis concerning for acute cystitis.  Patient hypotensive, hypothermic, has leukocytosis of 11.6K.  -Received cefepime, vancomycin and Flagyl in the ED Antibiotics deescalated, IV vancomycin and IV Flagyl discontinued on 03/08/2020. Urine culture growing Enterococcus faecalis greater than 100,000 colonies, cefepime discontinued and switched to Unasyn.  Switch to p.o. amoxicillin 03/10/2020. Blood cultures negative to date, continue to follow  Hyperkalemia/hyperphosphatemia Serum potassium 5.3, give 1 dose of Lokelma Discontinue losartan Electrolytes managed with hemodialysis  Resolved acute metabolic encephalopathy likely secondary to hypoglycemia versus sepsis: -CT head is negative for acute findings. -TSH, free T4, ammonia level: WNL, COVID-19 negative She is back to her baseline mentation.  ESRD on hemodialysis MWF: Mildly hypervolemic this morning Last hemodialysis was on Wednesday 03/05/20, and 03/08/2020 (in hospital) Plan hemodialysis 03/10/2020.  Hypermagnesemia Serum magnesium 3.1 Electrolytes managed with hemodialysis  Abnormal LFTs, unclear etiology Trend levels Repeat in the morning  Insulin-dependent type 2 diabetes mellitus:  Patient hypoglycemic upon arrival Avoid hypoglycemia Hemoglobin A1c 5.8 on 03/09/2020 from 18.9 in 2017. Insulin sliding scale Avoid  hypoglycemia  Essential hypertension:  Hypotensive upon arrival to the ED  Home  BP meds-Coreg, amlodipine, hydralazine -Monitor blood pressure closely.   BP is currently not at goal Resume home BP meds once blood pressure is back to baseline.  Diabetic neuropathy:  Continue gabapentin  Chronic bilateral lower extremity weakness: -No nerve response in legs on EMG on 10/2018. -PT assessment recommended home health PT Fall precaution  DVT prophylaxis: Heparin subcu 3 times daily/SCD Code Status: Full code Family Communication:  None at bedside.   Consults called: Nephrology by EDP   Disposition:  Status is: Inpatient    Dispo:  Patient From: [place]  Planned Disposition: [place]  Expected discharge date: 03/11/20  Medically stable for discharge: No, ongoing work-up and treatment of UTI.         Objective: Vitals:   03/09/20 1652 03/10/20 0027 03/10/20 0532 03/10/20 0816  BP: (!) 198/85 (!) 168/83 (!) 172/84 (!) 162/73  Pulse: 73 74 64 66  Resp: 16 16 15 14   Temp: 98.8 F (37.1 C) 98.7 F (37.1 C) 98.9 F (37.2 C) 98.4 F (36.9 C)  TempSrc: Oral Oral Oral Oral  SpO2: 97% 99% 100% 100%  Weight:   73.4 kg   Height:       No intake or output data in the 24 hours ending 03/10/20 1519 Filed Weights   03/08/20 1624 03/09/20 0451 03/10/20 0532  Weight: 70.8 kg 72.6 kg 73.4 kg    Exam:  . General: 54 y.o. year-old female pleasant with no acute distress.  Alert and oriented x3.  . Cardiovascular: Regular rate and rhythm no rubs or gallops. Marland Kitchen Respiratory: Clear to auscultation no wheezes or rales.   . Abdomen: Soft nontender normal bowel sounds present.   . Musculoskeletal: Trace lower extremity edema bilaterally. Marland Kitchen Psychiatry: Mood is appropriate for condition and setting.  Data Reviewed: CBC: Recent Labs  Lab 03/07/20 1128 03/07/20 1128 03/07/20 1145 03/07/20 1422 03/08/20 1327 03/09/20 0824 03/10/20 1324   WBC 11.6*  --   --  9.2 11.7* 10.4 7.9  NEUTROABS 7.9*  --   --   --   --   --  3.8  HGB 13.2   < > 15.6* 14.1 12.4 13.3 13.3  HCT 44.5   < > 46.0 47.3* 41.3 44.6 42.8  MCV 85.9  --   --  86.9 85.9 85.9 85.6  PLT 184  --   --  182 165 174 159   < > = values in this interval not displayed.   Basic Metabolic Panel: Recent Labs  Lab 03/07/20 1128 03/07/20 1128 03/07/20 1145 03/07/20 1422 03/08/20 1327 03/09/20 0824 03/10/20 1324  NA 138  --  137  --  139 138 140  K 5.3*  --  5.0  --  4.1 4.1 5.3*  CL 102  --  105  --  102 100 102  CO2 21*  --   --   --  22 20* 22  GLUCOSE 166*  --  159*  --  149* 123* 98  BUN 63*  --  57*  --  46* 27* 48*  CREATININE 6.65*   < > 6.70* 6.57* 5.27* 4.11* 5.63*  CALCIUM 8.7*  --   --   --  8.1* 8.4* 8.5*  MG  --   --   --  3.1*  --   --   --   PHOS  --   --   --  8.2*  --   --  6.9*   < > = values in  this interval not displayed.   GFR: Estimated Creatinine Clearance: 10.3 mL/min (A) (by C-G formula based on SCr of 5.63 mg/dL (H)). Liver Function Tests: Recent Labs  Lab 03/07/20 1128 03/08/20 1327 03/10/20 1324  AST 26 69* 86*  ALT 27 97* 104*  ALKPHOS 108 113 116  BILITOT 0.5 0.6 0.7  PROT 6.7 6.5 6.5  ALBUMIN 3.1* 2.9* 2.8*   Recent Labs  Lab 03/07/20 1128  LIPASE 61*   Recent Labs  Lab 03/07/20 1128  AMMONIA 18   Coagulation Profile: Recent Labs  Lab 03/07/20 1128 03/08/20 1924  INR 1.1 1.1   Cardiac Enzymes: No results for input(s): CKTOTAL, CKMB, CKMBINDEX, TROPONINI in the last 168 hours. BNP (last 3 results) No results for input(s): PROBNP in the last 8760 hours. HbA1C: Recent Labs    03/09/20 0824  HGBA1C 5.8*   CBG: Recent Labs  Lab 03/09/20 1152 03/09/20 1650 03/09/20 2134 03/10/20 0814 03/10/20 1149  GLUCAP 181* 178* 168* 182* 95   Lipid Profile: No results for input(s): CHOL, HDL, LDLCALC, TRIG, CHOLHDL, LDLDIRECT in the last 72 hours. Thyroid Function Tests: No results for input(s): TSH,  T4TOTAL, FREET4, T3FREE, THYROIDAB in the last 72 hours. Anemia Panel: No results for input(s): VITAMINB12, FOLATE, FERRITIN, TIBC, IRON, RETICCTPCT in the last 72 hours. Urine analysis:    Component Value Date/Time   COLORURINE STRAW (A) 03/07/2020 1123   APPEARANCEUR TURBID (A) 03/07/2020 1123   LABSPEC 1.019 03/07/2020 1123   PHURINE 7.0 03/07/2020 1123   GLUCOSEU NEGATIVE 03/07/2020 1123   GLUCOSEU >=1000 (A) 11/19/2015 1505   HGBUR SMALL (A) 03/07/2020 1123   BILIRUBINUR NEGATIVE 03/07/2020 1123   BILIRUBINUR neg 03/13/2015 1858   KETONESUR NEGATIVE 03/07/2020 1123   PROTEINUR >=300 (A) 03/07/2020 1123   UROBILINOGEN 0.2 11/19/2015 1505   NITRITE NEGATIVE 03/07/2020 1123   LEUKOCYTESUR MODERATE (A) 03/07/2020 1123   Sepsis Labs: @LABRCNTIP (procalcitonin:4,lacticidven:4)  ) Recent Results (from the past 240 hour(s))  Blood Culture (routine x 2)     Status: None (Preliminary result)   Collection Time: 03/07/20 11:03 AM   Specimen: BLOOD  Result Value Ref Range Status   Specimen Description BLOOD RIGHT THUMB  Final   Special Requests   Final    BOTTLES DRAWN AEROBIC AND ANAEROBIC Blood Culture adequate volume   Culture   Final    NO GROWTH 3 DAYS Performed at [facility], [place] [address] [place], [place] [address]    Report Status PENDING  Incomplete  Urine culture     Status: Abnormal   Collection Time: 03/07/20 11:23 AM   Specimen: In/Out Cath Urine  Result Value Ref Range Status   Specimen Description IN/OUT CATH URINE  Final   Special Requests   Final    NONE Performed at [facility], [place] [address], [place], [place] [address]    Culture (A)  Final    >=100,000 COLONIES/mL ENTEROCOCCUS FAECALIS >=100,000 COLONIES/mL STAPHYLOCOCCUS LUGDUNENSIS VANCOMYCIN RESISTANT ENTEROCOCCUS ISOLATED    Report Status 03/10/2020 FINAL  Final   Organism ID, Bacteria STAPHYLOCOCCUS LUGDUNENSIS (A)  Final   Organism ID, Bacteria ENTEROCOCCUS FAECALIS (A)  Final       Susceptibility   Enterococcus faecalis - MIC*    AMPICILLIN <=2 SENSITIVE Sensitive     NITROFURANTOIN <=16 SENSITIVE Sensitive     VANCOMYCIN >=32 RESISTANT Resistant     LINEZOLID 2 SENSITIVE Sensitive     * >=100,000 COLONIES/mL ENTEROCOCCUS FAECALIS   Staphylococcus lugdunensis - MIC*  CIPROFLOXACIN <=0.5 SENSITIVE Sensitive     GENTAMICIN <=0.5 SENSITIVE Sensitive     NITROFURANTOIN <=16 SENSITIVE Sensitive     OXACILLIN >=4 RESISTANT Resistant     TETRACYCLINE <=1 SENSITIVE Sensitive     VANCOMYCIN <=0.5 SENSITIVE Sensitive     TRIMETH/SULFA <=10 SENSITIVE Sensitive     CLINDAMYCIN RESISTANT Resistant     RIFAMPIN <=0.5 SENSITIVE Sensitive     Inducible Clindamycin POSITIVE Resistant     * >=100,000 COLONIES/mL STAPHYLOCOCCUS LUGDUNENSIS  Blood Culture (routine x 2)     Status: None (Preliminary result)   Collection Time: 03/07/20 11:28 AM   Specimen: BLOOD  Result Value Ref Range Status   Specimen Description BLOOD REJ  Final   Special Requests   Final    BOTTLES DRAWN AEROBIC AND ANAEROBIC Blood Culture adequate volume   Culture   Final    NO GROWTH 3 DAYS Performed at [facility], [address], [place], [place] [address]    Report Status PENDING  Incomplete  SARS Coronavirus 2 by RT PCR (hospital order, performed in [facility] hospital lab) Nasopharyngeal Nasopharyngeal Swab     Status: None   Collection Time: 03/07/20 11:28 AM   Specimen: Nasopharyngeal Swab  Result Value Ref Range Status   SARS Coronavirus 2 NEGATIVE NEGATIVE Final    Comment: (NOTE) SARS-CoV-2 target nucleic acids are NOT DETECTED.  The SARS-CoV-2 RNA is generally detectable in upper and lower respiratory specimens during the acute phase of infection. The lowest concentration of SARS-CoV-2 viral copies this assay can detect is 250 copies / mL. A negative result does not preclude SARS-CoV-2 infection and should not be used as the sole basis for treatment or other patient  management decisions.  A negative result may occur with improper specimen collection / handling, submission of specimen other than nasopharyngeal swab, presence of viral mutation(s) within the areas targeted by this assay, and inadequate number of viral copies (<250 copies / mL). A negative result must be combined with clinical observations, patient history, and epidemiological information.  Fact Sheet for Patients:   StrictlyIdeas.no  Fact Sheet for Healthcare Providers: BankingDealers.co.za  This test is not yet approved or  cleared by the [place] FDA and has been authorized for detection and/or diagnosis of SARS-CoV-2 by FDA under an Emergency Use Authorization (EUA).  This EUA will remain in effect (meaning this test can be used) for the duration of the COVID-19 declaration under Section 564(b)(1) of the Act, 21 U.S.C. section 360bbb-3(b)(1), unless the authorization is terminated or revoked sooner.  Performed at [facility], [place] [address] [place], [place] [address]   MRSA PCR Screening     Status: None   Collection Time: 03/08/20  6:14 AM   Specimen: Nasopharyngeal  Result Value Ref Range Status   MRSA by PCR NEGATIVE NEGATIVE Final    Comment:        The GeneXpert MRSA Assay (FDA approved for NASAL specimens only), is one component of a comprehensive MRSA colonization surveillance program. It is not intended to diagnose MRSA infection nor to guide or monitor treatment for MRSA infections. Performed at [facility], [place] [address], [place], [place] [address]       Studies: No results found.  Scheduled Meds: . amLODipine  10 mg Oral Daily  . amoxicillin  500 mg Oral q1800  . carvedilol  25 mg Oral BID  . Chlorhexidine Gluconate Cloth  6 each Topical Daily  . Chlorhexidine Gluconate  Cloth  6 each Topical Q0600  . gabapentin  200 mg Oral QHS  . heparin  5,000 Units Subcutaneous Q8H  . insulin  aspart  0-5 Units Subcutaneous QHS  . insulin aspart  0-6 Units Subcutaneous TID WC    Continuous Infusions:    LOS: 3 days     Kayleen Memos, MD [facility] Pager [phone]  If 7PM-7AM, please contact night-coverage [email or web address] Password [facility] 03/10/2020, 3:19 PM

## 2020-03-11 DIAGNOSIS — N3001 Acute cystitis with hematuria: Secondary | ICD-10-CM

## 2020-03-11 LAB — GLUCOSE, CAPILLARY
Glucose-Capillary: 160 mg/dL — ABNORMAL HIGH (ref 70–99)
Glucose-Capillary: 124 mg/dL — ABNORMAL HIGH (ref 70–99)
Glucose-Capillary: 95 mg/dL (ref 70–99)
Glucose-Capillary: 91 mg/dL (ref 70–99)

## 2020-03-11 MED ORDER — HEPARIN SODIUM (PORCINE) 1000 UNIT/ML IJ SOLN
INTRAMUSCULAR | Status: AC
  Administered 2020-03-11: 3500 [IU]
  Filled 2020-03-11: qty 7

## 2020-03-11 MED ORDER — OXYCODONE HCL 5 MG PO TABS
5.0000 mg | ORAL_TABLET | Freq: Four times a day (QID) | ORAL | Status: DC | PRN
  Administered 2020-03-11 – 2020-03-14 (×4): 5 mg via ORAL
  Filled 2020-03-11 (×4): qty 1

## 2020-03-11 MED ORDER — OXYCODONE HCL 5 MG PO TABS: 5.0000 mg | ORAL_TABLET | Freq: Two times a day (BID) | ORAL | Status: DC | PRN

## 2020-03-11 MED ORDER — SEVELAMER CARBONATE 800 MG PO TABS
800.0000 mg | ORAL_TABLET | Freq: Three times a day (TID) | ORAL | Status: DC
  Administered 2020-03-11: 800 mg via ORAL
  Filled 2020-03-11: qty 1

## 2020-03-11 MED ORDER — DOXYCYCLINE HYCLATE 100 MG PO TABS
100.0000 mg | ORAL_TABLET | Freq: Two times a day (BID) | ORAL | Status: DC
  Administered 2020-03-11 – 2020-03-12 (×3): 100 mg via ORAL
  Filled 2020-03-11 (×3): qty 1

## 2020-03-11 MED ORDER — CHLORHEXIDINE GLUCONATE CLOTH 2 % EX PADS
6.0000 | MEDICATED_PAD | Freq: Every day | CUTANEOUS | Status: DC
  Administered 2020-03-12 – 2020-03-15 (×4): 6 via TOPICAL

## 2020-03-11 MED ORDER — DOXYCYCLINE HYCLATE 50 MG PO CAPS
50.0000 mg | ORAL_CAPSULE | Freq: Two times a day (BID) | ORAL | Status: DC
  Filled 2020-03-11: qty 1

## 2020-03-11 NOTE — Progress Notes (Signed)
[facility] Progress Note   Subjective:   Patient seen in room. Still not feeling well, very tired. Denies SOB, CP, palpitations, abdominal pain, N/V/D. Seen by ID today who recommends doxycycline and amoxicillin. Had HD yesterday with net UF 3L.   Objective Vitals:   03/11/20 0530 03/11/20 0545 03/11/20 0630 03/11/20 1035  BP: (!) 180/90 (!) 177/99 (!) 197/82 (!) 165/75  Pulse: 69 69 68 64  Resp: 16 16 12 15   Temp:  98.1 F (36.7 C) 98 F (36.7 C) 98.5 F (36.9 C)  TempSrc:  Oral Oral Oral  SpO2:  99% 98% 100%  Weight:  71.1 kg    Height:       Physical Exam General: Well developed, tired appearing female Heart: RRR, no murmurs, rubs or gallops Lungs: CTA bilatearlly without wheezing, rhonchi or rales Abdomen: Soft, non-tender, non-distended, +BS Extremities: 1+ edema bilateral lower extremities Dialysis Access:  L IJ [facility]  Additional Objective Labs: Basic Metabolic Panel: Recent Labs  Lab 03/07/20 1145 03/07/20 1422 03/08/20 1327 03/09/20 0824 03/10/20 1324  NA   < >  --  139 138 140  K   < >  --  4.1 4.1 5.3*  CL   < >  --  102 100 102  CO2   < >  --  22 20* 22  GLUCOSE   < >  --  149* 123* 98  BUN   < >  --  46* 27* 48*  CREATININE   < > 6.57* 5.27* 4.11* 5.63*  CALCIUM   < >  --  8.1* 8.4* 8.5*  PHOS  --  8.2*  --   --  6.9*   < > = values in this interval not displayed.   Liver Function Tests: Recent Labs  Lab 03/07/20 1128 03/08/20 1327 03/10/20 1324  AST 26 69* 86*  ALT 27 97* 104*  ALKPHOS 108 113 116  BILITOT 0.5 0.6 0.7  PROT 6.7 6.5 6.5  ALBUMIN 3.1* 2.9* 2.8*   Recent Labs  Lab 03/07/20 1128  LIPASE 61*   CBC: Recent Labs  Lab 03/07/20 1128 03/07/20 1145 03/07/20 1422 03/07/20 1422 03/08/20 1327 03/09/20 0824 03/10/20 1324  WBC 11.6*   < > 9.2   < > 11.7* 10.4 7.9  NEUTROABS 7.9*  --   --   --   --   --  3.8  HGB 13.2   < > 14.1   < > 12.4 13.3 13.3  HCT 44.5   < > 47.3*   < > 41.3 44.6 42.8  MCV 85.9  --   86.9  --  85.9 85.9 85.6  PLT 184   < > 182   < > 165 174 159   < > = values in this interval not displayed.   Blood Culture    Component Value Date/Time   SDES BLOOD REJ 03/07/2020 1128   SPECREQUEST  03/07/2020 1128    BOTTLES DRAWN AEROBIC AND ANAEROBIC Blood Culture adequate volume   CULT  03/07/2020 1128    NO GROWTH 4 DAYS Performed at [facility], [place] [address], [place],  [address]    REPTSTATUS PENDING 03/07/2020 1128    CBG: Recent Labs  Lab 03/10/20 1149 03/10/20 1644 03/10/20 2144 03/11/20 0804 03/11/20 1133  GLUCAP 95 134* 183* 160* 124*   Medications:  . amLODipine  10 mg Oral Daily  . amoxicillin  500 mg Oral q1800  . carvedilol  25 mg Oral  BID  . Chlorhexidine Gluconate Cloth  6 each Topical Daily  . Chlorhexidine Gluconate Cloth  6 each Topical Q0600  . doxycycline  100 mg Oral Q12H  . gabapentin  200 mg Oral QHS  . heparin  5,000 Units Subcutaneous Q8H  . insulin aspart  0-5 Units Subcutaneous QHS  . insulin aspart  0-6 Units Subcutaneous TID WC    Dialysis Orders: [facility] Dr MWF 3.5h 350/700 70.5kg L IJ TDC Hep 5000 then 500/hr  Assessment/Plan: 1. UTI - enterococcus faecalis per urine culture.Seen by ID, recommending doxy and amoxicillin. Per Dr. Jonnie Finner, consider bladder lavage if no improvement.  2. ESRD- on HD MWF. AM labs pending. Continue MWF schedule 3. BP/ vol -Edema improved, BP remains elevated. Continue UF as tolerated with HD, may need to add  4. DM2 insulin- dependent - w/ sig complications  5. HTN -back on home coreg,  norvasc/losartan resumed 9/5 then losartan stopped 9/8. May need to add another agent if no improvement in BP with further volume reduction.  6. Anemia ckd - Hb13.3, no ESA indicated at this time.  7. MBD ckd - Calcium controlled, phos 8.2 >6.9. Does not appear to be on a binder, will start renvela 1 per meal  Anice Paganini, PA-C 03/11/2020, 12:40 PM  [facility] Pager: [phone]

## 2020-03-11 NOTE — Consult Note (Signed)
[facility] for Infectious Disease       Reason for Consult: cystitis    Referring Physician: Dr. Nevada Crane  Principal Problem:   Sepsis [facility]) Active Problems:   DM (diabetes mellitus), type 2, uncontrolled ([place])   Diabetic neuropathy ([place])   Essential hypertension   ESRD (end stage renal disease) on dialysis ([place])   Hyperkalemia   Acute cystitis   Acute metabolic encephalopathy   . amLODipine  10 mg Oral Daily  . amoxicillin  500 mg Oral q1800  . carvedilol  25 mg Oral BID  . Chlorhexidine Gluconate Cloth  6 each Topical Daily  . Chlorhexidine Gluconate Cloth  6 each Topical Q0600  . doxycycline  50 mg Oral Q12H  . gabapentin  200 mg Oral QHS  . heparin  5,000 Units Subcutaneous Q8H  . insulin aspart  0-5 Units Subcutaneous QHS  . insulin aspart  0-6 Units Subcutaneous TID WC    Recommendations: Doxycycline 100 mg bid for 7 days total thorugh 9/13 Amoxicillin 500 mg TID for 7 days total through 9/11  Assessment: She has cystitis based on UA findings, CT scan at [facility] and here and some pelvic discomfort.  No fever or chills.    I will sign  Off.  Thanks for consultation.   Antibiotics: Ampicillin and doxycycline  HPI: LEEANN BADY is a 54 y.o. female with ESRD, DM with history of UTI and came in with weakness.  She was unclear of why she was admitted.  CT scan at [facility] and here as above with inflammation c/w cystitis.  She does not report any symptoms other than some chronic discomfort in her LLQ of abdomen.  No fever or chills.  She feels poorly.  UA with copious WBCs, blood, bacteria.  Culture with Enterococcus and Staph lugdunensis.   No other complaints.  Review of Systems:  Constitutional: negative for fevers and chills Gastrointestinal: negative for diarrhea All other systems reviewed and are negative    Past Medical History:  Diagnosis Date  . Colitis   . Diabetes mellitus   . Family history of adverse reaction to anesthesia    " my uncle's heart  stoped "  . GERD (gastroesophageal reflux disease)   . Heart murmur   . Hypertension   . 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 Mitral regurgitation   . Neuropathy in diabetes ([place])   . Syncope and collapse 07/28/2015    Social History   Tobacco Use  . Smoking status: Never Smoker  . Smokeless tobacco: Never Used  Substance Use Topics  . Alcohol use: No    Alcohol/week: 0.0 standard drinks  . Drug use: No    Family History  Problem Relation Age of Onset  . Diabetes Father   . Heart attack Father   . Aneurysm Mother     Allergies  Allergen Reactions  . Amitriptyline Swelling    Other reaction(s): Confusion (intolerance)  . Ciprofloxacin Diarrhea and Nausea And Vomiting    VOMITING Severe vomiting requiring ED visit, IV reglan and IV fluids VOMITING   . Semaglutide Nausea Only  . Tramadol Itching  . Tylenol [Acetaminophen] Itching    Physical Exam: Constitutional: in no apparent distress and well developed and well nourished  Vitals:   03/11/20 0545 03/11/20 0630  BP: (!) 177/99 (!) 197/82  Pulse: 69 68  Resp: 16 12  Temp: 98.1 F (36.7 C) 98 F (36.7 C)  SpO2: 99% 98%   EYES: anicteric Cardiovascular: Cor  RRR Respiratory: clear; GI: soft, nd; mild tenderness in llq with no guarding or rebound Musculoskeletal: no pedal edema noted Skin: negatives: no rash Neuro: non-focal  Lab Results  Component Value Date   WBC 7.9 03/10/2020   HGB 13.3 03/10/2020   HCT 42.8 03/10/2020   MCV 85.6 03/10/2020   PLT 159 03/10/2020    Lab Results  Component Value Date   CREATININE 5.63 (H) 03/10/2020   BUN 48 (H) 03/10/2020   NA 140 03/10/2020   K 5.3 (H) 03/10/2020   CL 102 03/10/2020   CO2 22 03/10/2020    Lab Results  Component Value Date   ALT 104 (H) 03/10/2020   AST 86 (H) 03/10/2020   ALKPHOS 116 03/10/2020     Microbiology: Recent Results (from the past 240 hour(s))  Blood Culture (routine x 2)     Status: None (Preliminary result)    Collection Time: 03/07/20 11:03 AM   Specimen: BLOOD  Result Value Ref Range Status   Specimen Description BLOOD RIGHT THUMB  Final   Special Requests   Final    BOTTLES DRAWN AEROBIC AND ANAEROBIC Blood Culture adequate volume   Culture   Final    NO GROWTH 4 DAYS Performed at [facility], [address], [place], [place] [address]    Report Status PENDING  Incomplete  Urine culture     Status: Abnormal   Collection Time: 03/07/20 11:23 AM   Specimen: In/Out Cath Urine  Result Value Ref Range Status   Specimen Description IN/OUT CATH URINE  Final   Special Requests   Final    NONE Performed at [facility], [place] [address], [place], [place] [address]    Culture (A)  Final    >=100,000 COLONIES/mL ENTEROCOCCUS FAECALIS >=100,000 COLONIES/mL STAPHYLOCOCCUS LUGDUNENSIS VANCOMYCIN RESISTANT ENTEROCOCCUS ISOLATED    Report Status 03/10/2020 FINAL  Final   Organism ID, Bacteria STAPHYLOCOCCUS LUGDUNENSIS (A)  Final   Organism ID, Bacteria ENTEROCOCCUS FAECALIS (A)  Final      Susceptibility   Enterococcus faecalis - MIC*    AMPICILLIN <=2 SENSITIVE Sensitive     NITROFURANTOIN <=16 SENSITIVE Sensitive     VANCOMYCIN >=32 RESISTANT Resistant     LINEZOLID 2 SENSITIVE Sensitive     * >=100,000 COLONIES/mL ENTEROCOCCUS FAECALIS   Staphylococcus lugdunensis - MIC*    CIPROFLOXACIN <=0.5 SENSITIVE Sensitive     GENTAMICIN <=0.5 SENSITIVE Sensitive     NITROFURANTOIN <=16 SENSITIVE Sensitive     OXACILLIN >=4 RESISTANT Resistant     TETRACYCLINE <=1 SENSITIVE Sensitive     VANCOMYCIN <=0.5 SENSITIVE Sensitive     TRIMETH/SULFA <=10 SENSITIVE Sensitive     CLINDAMYCIN RESISTANT Resistant     RIFAMPIN <=0.5 SENSITIVE Sensitive     Inducible Clindamycin POSITIVE Resistant     * >=100,000 COLONIES/mL STAPHYLOCOCCUS LUGDUNENSIS  Blood Culture (routine x 2)     Status: None (Preliminary result)   Collection Time: 03/07/20 11:28 AM   Specimen: BLOOD  Result Value Ref Range  Status   Specimen Description BLOOD REJ  Final   Special Requests   Final    BOTTLES DRAWN AEROBIC AND ANAEROBIC Blood Culture adequate volume   Culture   Final    NO GROWTH 4 DAYS Performed at [facility], [address], [place], [place] [address]    Report Status PENDING  Incomplete  SARS Coronavirus 2 by RT PCR (hospital order, performed in [facility] hospital lab) Nasopharyngeal Nasopharyngeal Swab  Status: None   Collection Time: 03/07/20 11:28 AM   Specimen: Nasopharyngeal Swab  Result Value Ref Range Status   SARS Coronavirus 2 NEGATIVE NEGATIVE Final    Comment: (NOTE) SARS-CoV-2 target nucleic acids are NOT DETECTED.  The SARS-CoV-2 RNA is generally detectable in upper and lower respiratory specimens during the acute phase of infection. The lowest concentration of SARS-CoV-2 viral copies this assay can detect is 250 copies / mL. A negative result does not preclude SARS-CoV-2 infection and should not be used as the sole basis for treatment or other patient management decisions.  A negative result may occur with improper specimen collection / handling, submission of specimen other than nasopharyngeal swab, presence of viral mutation(s) within the areas targeted by this assay, and inadequate number of viral copies (<250 copies / mL). A negative result must be combined with clinical observations, patient history, and epidemiological information.  Fact Sheet for Patients:   StrictlyIdeas.no  Fact Sheet for Healthcare Providers: BankingDealers.co.za  This test is not yet approved or  cleared by the [place] FDA and has been authorized for detection and/or diagnosis of SARS-CoV-2 by FDA under an Emergency Use Authorization (EUA).  This EUA will remain in effect (meaning this test can be used) for the duration of the COVID-19 declaration under Section 564(b)(1) of the Act, 21 U.S.C. section 360bbb-3(b)(1), unless  the authorization is terminated or revoked sooner.  Performed at [facility], [place] [address] [place], [place] [address]   MRSA PCR Screening     Status: None   Collection Time: 03/08/20  6:14 AM   Specimen: Nasopharyngeal  Result Value Ref Range Status   MRSA by PCR NEGATIVE NEGATIVE Final    Comment:        The GeneXpert MRSA Assay (FDA approved for NASAL specimens only), is one component of a comprehensive MRSA colonization surveillance program. It is not intended to diagnose MRSA infection nor to guide or monitor treatment for MRSA infections. Performed at [facility], [place] [address], [place], [place] [address]     Robert W Comer, [facility] for Infectious Disease [facility] [email or web address] 03/11/2020, 9:34 AM

## 2020-03-11 NOTE — Progress Notes (Signed)
Physical Therapy Treatment Patient Details Name: [name] [name] MRN: [id number] DOB: 09/21/1965 Today's Date: 03/11/2020    History of Present Illness Patient is a 54 y/o female who presents from [facility] with AMS; found to have hypoglycemia, metabolic encephalopathy and sepsis secondary to UTI. CT abdomen- acute cystitis. PMH includes neuropathy, HTN, DM, mitral regurgitation.    PT Comments    Pt presents with significant weakness and deconditioning upper and lower body.  She also is rather stiff making it difficult for her weakness to overcome the tightness.  Emphasis on stretching and ROM exercise with resistance, transitions, scooting, sit to stand, pregait working to w/shift, unweight and step, ending with transfers.   Follow Up Recommendations  CIR;Supervision for mobility/OOB;Supervision/Assistance - 24 hour     Equipment Recommendations  None recommended by PT    Recommendations for Other Services       Precautions / Restrictions Precautions Precautions: Fall    Mobility  Bed Mobility Overal bed mobility: Needs Assistance Bed Mobility: Supine to Sit     Supine to sit: Mod assist;+2 for physical assistance     General bed mobility comments: maximal assist to set up LE's for bridging and to assist.  Cues for technique.  Assist to bring trunk up and forward via R elbow and assist to get hands placed optimally to help her scoot.  Transfers Overall transfer level: Needs assistance Equipment used: Rolling walker (2 wheeled) Transfers: Sit to/from [facility] Sit to Stand: Mod assist;+2 physical assistance Stand pivot transfers: Max assist;+2 physical assistance       General transfer comment: cues for hand placement.  assist to come forward and boost up.  Ambulation/Gait             General Gait Details: pre gait capable only   Stairs             Wheelchair Mobility    Modified Rankin (Stroke Patients Only)       Balance  Overall balance assessment: Needs assistance Sitting-balance support: Feet supported;No upper extremity supported Sitting balance-Leahy Scale: Fair     Standing balance support: During functional activity;Bilateral upper extremity supported Standing balance-Leahy Scale: Poor Standing balance comment: Requires UE support and external support; worked 3-4 min on w/shifting, unweighting, attempting to lift heels, then feet off the floor.  Pt having trouble clearing his right foot to pivot.                            Cognition Arousal/Alertness: Awake/alert Behavior During Therapy: WFL for tasks assessed/performed Overall Cognitive Status: Within Functional Limits for tasks assessed                                        Exercises Other Exercises Other Exercises: 10 reps bil U and LE's with hip/knee flexion/ext with graded resistance in gross extension and bicep/tricep presses with graded resistance both flexion/ext.    General Comments General comments (skin integrity, edema, etc.): vss      Pertinent Vitals/Pain Pain Assessment: Faces Faces Pain Scale: Hurts a little bit Pain Location: knees Pain Descriptors / Indicators: Tightness;Grimacing Pain Intervention(s): Monitored during session    Home Living                      Prior Function  PT Goals (current goals can now be found in the care plan section) Acute Rehab PT Goals Patient Stated Goal: to be able to walk again PT Goal Formulation: With patient Time For Goal Achievement: 03/23/20 Potential to Achieve Goals: Good Progress towards PT goals: Progressing toward goals    Frequency    Min 3X/week      PT Plan Current plan remains appropriate    Co-evaluation              AM-PAC PT "6 Clicks" Mobility   Outcome Measure  Help needed turning from your back to your side while in a flat bed without using bedrails?: A Lot Help needed moving from lying on your  back to sitting on the side of a flat bed without using bedrails?: A Lot Help needed moving to and from a bed to a chair (including a wheelchair)?: Total Help needed standing up from a chair using your arms (e.g., wheelchair or bedside chair)?: A Lot Help needed to walk in hospital room?: Total Help needed climbing 3-5 steps with a railing? : Total 6 Click Score: 9    End of Session Equipment Utilized During Treatment: Gait belt Activity Tolerance: Patient tolerated treatment well Patient left: in chair;with call bell/phone within reach;with chair alarm set;with nursing/sitter in room Nurse Communication: Mobility status PT Visit Diagnosis: Muscle weakness (generalized) (M62.81);Unsteadiness on feet (R26.81);Difficulty in walking, not elsewhere classified (R26.2)     Time: 4462-8638 PT Time Calculation (min) (ACUTE ONLY): 39 min  Charges:  $Therapeutic Exercise: 8-22 mins $Therapeutic Activity: 23-37 mins                     03/11/2020  Ginger Carne., PT Acute Rehabilitation Services [phone]  (pager) [phone]  (office)   Tessie Fass Erick Murin 03/11/2020, 4:49 PM

## 2020-03-11 NOTE — Progress Notes (Signed)
Pt complains of headache 5/10 which she states has been present most of the day today. Pt requesting pain medication-states she is allergic to tylenol(itching) and was told not to take NSAIDS by nephrologist. Pt states she takes Oxy IR for pain and is requesting a "half dose". Provider on call notified. Orders placed for Tylenol PO and IV Benadryl. Pt refused at this time. Will continue to monitor. Jessie Foot, RN

## 2020-03-11 NOTE — Progress Notes (Signed)
PROGRESS NOTE  MARCIANNA DAILY YFV:[id number] DOB: [birth year] DOA: 03/07/2020 PCP: Biagio Borg, MD  HPI/Recap of past 24 hours: [name] [name] is a 54 y.o. female with medical history significant of hypertension, insulin-dependent type 2 diabetes, diabetic neuropathy, ESRD on hemodialysis MWF, NAFLD, chronic bilateral lower extremity weakness, recurrent ESBL Klebsiella UTI, history of pubic symphysis septic arthritis, osteomyelitis and extraperitoneal left thigh abscess status post I & D of left groin on 11/26/2019 brought by EMS from SNF to the emergency department for evaluation of AMS.  At rehab, was noted to be confused and unresponsive.  EMS was called-her initial CBG was noted to be 44 and improved to 190 after glucagon.  Blood pressure was noted to be 88/45 and was given a bolus of normal saline, BP increased to 106/68.  Patient was brought to ER for further evaluation and management.  ED Course: UA positive for pyuria.  Chest x-ray, troponin, CT head negative.  CT abdomen/pelvis concerning for acute cystitis.  Patient received cefepime, vancomycin, Flagyl and Nalaxone in the ED.  Received HD on 03/08/2020 as she missed her Friday (09/03) session and also on 03/10/2020.  Urine culture growing Enterococcus faecalis and Staph Lugdunensis.  Blood cultures negative to date.  03/11/20: Seen and examined.  Somnolent this morning but easily arousable.  She has no new complaints at this time.  Infectious disease consulted at nephrology's recommendations.     Assessment/Plan: Principal Problem:   Sepsis ([place]) Active Problems:   DM (diabetes mellitus), type 2, uncontrolled ([place])   Diabetic neuropathy ([place])   Essential hypertension   ESRD (end stage renal disease) on dialysis ([place])   Hyperkalemia   Acute cystitis   Acute metabolic encephalopathy  Sepsis secondary to Enterococcus faecalis, Staph Lugdunensis UTI:  Afebrile no leukocytosis, resolved. Presented with CT abdomen/pelvis concerning for  acute cystitis.   Presented hypotensive, hypothermic, with leukocytosis of 11.6K.  -Received cefepime, vancomycin and Flagyl in the ED Antibiotics deescalated, IV vancomycin and IV Flagyl discontinued on 03/08/2020. Urine culture growing Enterococcus faecalis greater than 100,000 colonies, cefepime discontinued, switched to Unasyn.  Switch to p.o. amoxicillin on 03/10/2020.  Added doxycycline to cover staph lugdunensis. Blood cultures negative to date ID consulted at nephrologist recommendation  Hyperkalemia/hyperphosphatemia Serum potassium 5.3, give 1 dose of Lokelma Discontinued losartan Electrolytes managed with hemodialysis  Resolved acute metabolic encephalopathy likely secondary to hypoglycemia versus sepsis: -CT head is negative for acute findings. -TSH, free T4, ammonia level: WNL, COVID-19 negative She is back to her baseline mentation.  ESRD on hemodialysis MWF: Nephrology following  Hypermagnesemia Electrolytes managed with hemodialysis  Abnormal LFTs, unclear etiology Trend levels, AST and ALT uptrending Repeat CMP in the morning  Insulin-dependent type 2 diabetes mellitus:  Patient hypoglycemic upon arrival Avoid hypoglycemia Hemoglobin A1c 5.8 on 03/09/2020 from 18.9 in 2017. Insulin sliding scale, sensitive Avoid hypoglycemia  Essential hypertension:  Hypotensive upon arrival to the ED  Home BP meds-Coreg, amlodipine, hydralazine -Monitor blood pressure closely.   BP is currently not at goal Resume home BP meds once blood pressure is back to baseline.  Diabetic neuropathy:  Continue gabapentin  Chronic bilateral lower extremity weakness: -No nerve response in legs on EMG on 10/2018. -PT assessment recommended home health PT Fall precaution  DVT prophylaxis: Heparin subcu 3 times daily/SCD Code Status: Full code Family Communication:  None at bedside.   Consults called: Nephrology by EDP   Disposition:  Status is:  Inpatient    Dispo:  Patient From: [place]  Planned Disposition: [place]  Expected discharge date: 03/12/20  Medically stable for discharge: No, ongoing work-up and treatment of UTI.         Objective: Vitals:   03/11/20 0630 03/11/20 1035 03/11/20 1328 03/11/20 1600  BP: (!) 197/82 (!) 165/75 (!) 165/73   Pulse: 68 64 65 66  Resp: 12 15 15    Temp: 98 F (36.7 C) 98.5 F (36.9 C) 97.9 F (36.6 C)   TempSrc: Oral Oral Oral   SpO2: 98% 100% 100% 100%  Weight:      Height:        Intake/Output Summary (Last 24 hours) at 03/11/2020 1655 Last data filed at 03/11/2020 1000 Gross per 24 hour  Intake 222 ml  Output 2500 ml  Net -2278 ml   Filed Weights   03/10/20 0532 03/11/20 0203 03/11/20 0545  Weight: 73.4 kg 72.9 kg 71.1 kg    Exam:  . General: 54 y.o. year-old female somnolent but easily arousable to voices . Cardiovascular: Regular rate and rhythm no rubs. Marland Kitchen Respiratory: Clear to Auscultation No Wheezes or Rales.   . Abdomen: Soft nontender normal bowel sounds present . Musculoskeletal: Trace lower extremity edema bilaterally.   Marland Kitchen Psychiatry: Mood is appropriate for condition and setting.  Data Reviewed: CBC: Recent Labs  Lab 03/07/20 1128 03/07/20 1128 03/07/20 1145 03/07/20 1422 03/08/20 1327 03/09/20 0824 03/10/20 1324  WBC 11.6*  --   --  9.2 11.7* 10.4 7.9  NEUTROABS 7.9*  --   --   --   --   --  3.8  HGB 13.2   < > 15.6* 14.1 12.4 13.3 13.3  HCT 44.5   < > 46.0 47.3* 41.3 44.6 42.8  MCV 85.9  --   --  86.9 85.9 85.9 85.6  PLT 184  --   --  182 165 174 159   < > = values in this interval not displayed.   Basic Metabolic Panel: Recent Labs  Lab 03/07/20 1128 03/07/20 1128 03/07/20 1145 03/07/20 1422 03/08/20 1327 03/09/20 0824 03/10/20 1324  NA 138  --  137  --  139 138 140  K 5.3*  --  5.0  --  4.1 4.1 5.3*  CL 102  --  105  --  102 100 102  CO2 21*  --   --   --  22 20* 22  GLUCOSE 166*  --  159*   --  149* 123* 98  BUN 63*  --  57*  --  46* 27* 48*  CREATININE 6.65*   < > 6.70* 6.57* 5.27* 4.11* 5.63*  CALCIUM 8.7*  --   --   --  8.1* 8.4* 8.5*  MG  --   --   --  3.1*  --   --   --   PHOS  --   --   --  8.2*  --   --  6.9*   < > = values in this interval not displayed.   GFR: Estimated Creatinine Clearance: 10.2 mL/min (A) (by C-G formula based on SCr of 5.63 mg/dL (H)). Liver Function Tests: Recent Labs  Lab 03/07/20 1128 03/08/20 1327 03/10/20 1324  AST 26 69* 86*  ALT 27 97* 104*  ALKPHOS 108 113 116  BILITOT 0.5 0.6 0.7  PROT 6.7 6.5 6.5  ALBUMIN 3.1* 2.9* 2.8*   Recent Labs  Lab 03/07/20 1128  LIPASE 61*   Recent Labs  Lab 03/07/20 1128  AMMONIA 18  Coagulation Profile: Recent Labs  Lab 03/07/20 1128 03/08/20 1924  INR 1.1 1.1   Cardiac Enzymes: No results for input(s): CKTOTAL, CKMB, CKMBINDEX, TROPONINI in the last 168 hours. BNP (last 3 results) No results for input(s): PROBNP in the last 8760 hours. HbA1C: Recent Labs    03/09/20 0824  HGBA1C 5.8*   CBG: Recent Labs  Lab 03/10/20 1644 03/10/20 2144 03/11/20 0804 03/11/20 1133 03/11/20 1608  GLUCAP 134* 183* 160* 124* 95   Lipid Profile: No results for input(s): CHOL, HDL, LDLCALC, TRIG, CHOLHDL, LDLDIRECT in the last 72 hours. Thyroid Function Tests: No results for input(s): TSH, T4TOTAL, FREET4, T3FREE, THYROIDAB in the last 72 hours. Anemia Panel: No results for input(s): VITAMINB12, FOLATE, FERRITIN, TIBC, IRON, RETICCTPCT in the last 72 hours. Urine analysis:    Component Value Date/Time   COLORURINE STRAW (A) 03/07/2020 1123   APPEARANCEUR TURBID (A) 03/07/2020 1123   LABSPEC 1.019 03/07/2020 1123   PHURINE 7.0 03/07/2020 1123   GLUCOSEU NEGATIVE 03/07/2020 1123   GLUCOSEU >=1000 (A) 11/19/2015 1505   HGBUR SMALL (A) 03/07/2020 1123   BILIRUBINUR NEGATIVE 03/07/2020 1123   BILIRUBINUR neg 03/13/2015 1858   KETONESUR NEGATIVE 03/07/2020 1123   PROTEINUR >=300 (A)  03/07/2020 1123   UROBILINOGEN 0.2 11/19/2015 1505   NITRITE NEGATIVE 03/07/2020 1123   LEUKOCYTESUR MODERATE (A) 03/07/2020 1123   Sepsis Labs: @LABRCNTIP (procalcitonin:4,lacticidven:4)  ) Recent Results (from the past 240 hour(s))  Blood Culture (routine x 2)     Status: None (Preliminary result)   Collection Time: 03/07/20 11:03 AM   Specimen: BLOOD  Result Value Ref Range Status   Specimen Description BLOOD RIGHT THUMB  Final   Special Requests   Final    BOTTLES DRAWN AEROBIC AND ANAEROBIC Blood Culture adequate volume   Culture   Final    NO GROWTH 4 DAYS Performed at [facility], [place] [address], [place], [place] [address]    Report Status PENDING  Incomplete  Urine culture     Status: Abnormal   Collection Time: 03/07/20 11:23 AM   Specimen: In/Out Cath Urine  Result Value Ref Range Status   Specimen Description IN/OUT CATH URINE  Final   Special Requests   Final    NONE Performed at [facility], [place] [address], [place], [place] [address]    Culture (A)  Final    >=100,000 COLONIES/mL ENTEROCOCCUS FAECALIS >=100,000 COLONIES/mL STAPHYLOCOCCUS LUGDUNENSIS VANCOMYCIN RESISTANT ENTEROCOCCUS ISOLATED    Report Status 03/10/2020 FINAL  Final   Organism ID, Bacteria STAPHYLOCOCCUS LUGDUNENSIS (A)  Final   Organism ID, Bacteria ENTEROCOCCUS FAECALIS (A)  Final      Susceptibility   Enterococcus faecalis - MIC*    AMPICILLIN <=2 SENSITIVE Sensitive     NITROFURANTOIN <=16 SENSITIVE Sensitive     VANCOMYCIN >=32 RESISTANT Resistant     LINEZOLID 2 SENSITIVE Sensitive     * >=100,000 COLONIES/mL ENTEROCOCCUS FAECALIS   Staphylococcus lugdunensis - MIC*    CIPROFLOXACIN <=0.5 SENSITIVE Sensitive     GENTAMICIN <=0.5 SENSITIVE Sensitive     NITROFURANTOIN <=16 SENSITIVE Sensitive     OXACILLIN >=4 RESISTANT Resistant     TETRACYCLINE <=1 SENSITIVE Sensitive     VANCOMYCIN <=0.5 SENSITIVE Sensitive     TRIMETH/SULFA <=10 SENSITIVE Sensitive      CLINDAMYCIN RESISTANT Resistant     RIFAMPIN <=0.5 SENSITIVE Sensitive     Inducible Clindamycin POSITIVE Resistant     * >=100,000 COLONIES/mL STAPHYLOCOCCUS LUGDUNENSIS  Blood Culture (  routine x 2)     Status: None (Preliminary result)   Collection Time: 03/07/20 11:28 AM   Specimen: BLOOD  Result Value Ref Range Status   Specimen Description BLOOD REJ  Final   Special Requests   Final    BOTTLES DRAWN AEROBIC AND ANAEROBIC Blood Culture adequate volume   Culture   Final    NO GROWTH 4 DAYS Performed at [facility], [address], [place], [place] [address]    Report Status PENDING  Incomplete  SARS Coronavirus 2 by RT PCR (hospital order, performed in [facility] hospital lab) Nasopharyngeal Nasopharyngeal Swab     Status: None   Collection Time: 03/07/20 11:28 AM   Specimen: Nasopharyngeal Swab  Result Value Ref Range Status   SARS Coronavirus 2 NEGATIVE NEGATIVE Final    Comment: (NOTE) SARS-CoV-2 target nucleic acids are NOT DETECTED.  The SARS-CoV-2 RNA is generally detectable in upper and lower respiratory specimens during the acute phase of infection. The lowest concentration of SARS-CoV-2 viral copies this assay can detect is 250 copies / mL. A negative result does not preclude SARS-CoV-2 infection and should not be used as the sole basis for treatment or other patient management decisions.  A negative result may occur with improper specimen collection / handling, submission of specimen other than nasopharyngeal swab, presence of viral mutation(s) within the areas targeted by this assay, and inadequate number of viral copies (<250 copies / mL). A negative result must be combined with clinical observations, patient history, and epidemiological information.  Fact Sheet for Patients:   StrictlyIdeas.no  Fact Sheet for Healthcare Providers: BankingDealers.co.za  This test is not yet approved or  cleared by the  [place] FDA and has been authorized for detection and/or diagnosis of SARS-CoV-2 by FDA under an Emergency Use Authorization (EUA).  This EUA will remain in effect (meaning this test can be used) for the duration of the COVID-19 declaration under Section 564(b)(1) of the Act, 21 U.S.C. section 360bbb-3(b)(1), unless the authorization is terminated or revoked sooner.  Performed at [facility], [place] [address], [place], [place] [address]   MRSA PCR Screening     Status: None   Collection Time: 03/08/20  6:14 AM   Specimen: Nasopharyngeal  Result Value Ref Range Status   MRSA by PCR NEGATIVE NEGATIVE Final    Comment:        The GeneXpert MRSA Assay (FDA approved for NASAL specimens only), is one component of a comprehensive MRSA colonization surveillance program. It is not intended to diagnose MRSA infection nor to guide or monitor treatment for MRSA infections. Performed at [facility], [place] [address], [place], [place] [address]       Studies: No results found.  Scheduled Meds: . amLODipine  10 mg Oral Daily  . amoxicillin  500 mg Oral q1800  . carvedilol  25 mg Oral BID  . [START ON 03/12/2020] Chlorhexidine Gluconate Cloth  6 each Topical Q0600  . doxycycline  100 mg Oral Q12H  . gabapentin  200 mg Oral QHS  . heparin  5,000 Units Subcutaneous Q8H  . insulin aspart  0-5 Units Subcutaneous QHS  . insulin aspart  0-6 Units Subcutaneous TID WC  . sevelamer carbonate  800 mg Oral TID WC    Continuous Infusions:    LOS: 4 days     Kayleen Memos, MD [facility] Pager [phone]  If 7PM-7AM, please contact night-coverage [email or web address] Password [email or web address] 03/11/2020, 4:55 PM

## 2020-03-11 NOTE — TOC Progression Note (Addendum)
Transition of Care [facility]) - Progression Note    Patient Details  Name: RAIN WILHIDE MRN: [id number] Date of Birth: [birth year]  Transition of Care [facility]) CM/SW [place], [place] Phone Number: 03/11/2020, 1:18 PM  Clinical Narrative:     Update 9/7 4:14pm- CSW spoke with patient at bedside. CSW provided SNF bed offers to patient. Patient wants to discuss offers with family. Patient will call CSW with SNF choice. CSW will start insurance authorization for patient once SNF choice is made.  CSW will continue to follow.  CSW spoke with Nicki with [facility] and they cannot offer a SNF bed at this time. CSW let patient know that [facility] cannot offer a SNF bed at this time due to no isolation beds. Patient requested for CSW to fax out intiial referral to [place] area. CSW faxed out initial referral to [place] area.  Pending bed offers.  Expected Discharge Plan: [place] Barriers to Discharge: Continued Medical Work up  Expected Discharge Plan and Services Expected Discharge Plan: [place] arrangements for the past 2 months: [place]                                       Social Determinants of Health (SDOH) Interventions    Readmission Risk Interventions No flowsheet data found.

## 2020-03-12 ENCOUNTER — Inpatient Hospital Stay (HOSPITAL_COMMUNITY): Payer: Medicare Other

## 2020-03-12 DIAGNOSIS — L8990 Pressure ulcer of unspecified site, unspecified stage: Secondary | ICD-10-CM | POA: Insufficient documentation

## 2020-03-12 LAB — CBC WITH DIFFERENTIAL/PLATELET
Abs Immature Granulocytes: 0.02 10*3/uL (ref 0.00–0.07)
Basophils Absolute: 0.1 10*3/uL (ref 0.0–0.1)
Basophils Relative: 2 %
Eosinophils Absolute: 0.2 10*3/uL (ref 0.0–0.5)
Eosinophils Relative: 4 %
HCT: 43.2 % (ref 36.0–46.0)
Hemoglobin: 13 g/dL (ref 12.0–15.0)
Immature Granulocytes: 0 %
Lymphocytes Relative: 51 %
Lymphs Abs: 3.1 10*3/uL (ref 0.7–4.0)
MCH: 25.6 pg — ABNORMAL LOW (ref 26.0–34.0)
MCHC: 30.1 g/dL (ref 30.0–36.0)
MCV: 85 fL (ref 80.0–100.0)
Monocytes Absolute: 0.5 10*3/uL (ref 0.1–1.0)
Monocytes Relative: 9 %
Neutro Abs: 2.1 10*3/uL (ref 1.7–7.7)
Neutrophils Relative %: 34 %
Platelets: 162 10*3/uL (ref 150–400)
RBC: 5.08 MIL/uL (ref 3.87–5.11)
RDW: 18.1 % — ABNORMAL HIGH (ref 11.5–15.5)
WBC: 6.1 10*3/uL (ref 4.0–10.5)
nRBC: 0 % (ref 0.0–0.2)

## 2020-03-12 LAB — COMPREHENSIVE METABOLIC PANEL
ALT: 71 U/L — ABNORMAL HIGH (ref 0–44)
AST: 43 U/L — ABNORMAL HIGH (ref 15–41)
Albumin: 2.6 g/dL — ABNORMAL LOW (ref 3.5–5.0)
Alkaline Phosphatase: 101 U/L (ref 38–126)
Anion gap: 13 (ref 5–15)
BUN: 34 mg/dL — ABNORMAL HIGH (ref 6–20)
CO2: 25 mmol/L (ref 22–32)
Calcium: 8.8 mg/dL — ABNORMAL LOW (ref 8.9–10.3)
Chloride: 98 mmol/L (ref 98–111)
Creatinine, Ser: 4.28 mg/dL — ABNORMAL HIGH (ref 0.44–1.00)
GFR calc Af Amer: 13 mL/min — ABNORMAL LOW (ref >60)
GFR calc non Af Amer: 11 mL/min — ABNORMAL LOW (ref >60)
Glucose, Bld: 99 mg/dL (ref 70–99)
Potassium: 4.6 mmol/L (ref 3.5–5.1)
Sodium: 136 mmol/L (ref 135–145)
Total Bilirubin: 0.7 mg/dL (ref 0.3–1.2)
Total Protein: 6.4 g/dL — ABNORMAL LOW (ref 6.5–8.1)

## 2020-03-12 LAB — GLUCOSE, CAPILLARY
Glucose-Capillary: 101 mg/dL — ABNORMAL HIGH (ref 70–99)
Glucose-Capillary: 118 mg/dL — ABNORMAL HIGH (ref 70–99)
Glucose-Capillary: 105 mg/dL — ABNORMAL HIGH (ref 70–99)
Glucose-Capillary: 134 mg/dL — ABNORMAL HIGH (ref 70–99)

## 2020-03-12 LAB — CULTURE, BLOOD (ROUTINE X 2)
Culture: NO GROWTH
Culture: NO GROWTH
Special Requests: ADEQUATE
Special Requests: ADEQUATE

## 2020-03-12 LAB — LIPASE, BLOOD: Lipase: 25 U/L (ref 11–51)

## 2020-03-12 MED ORDER — HEPARIN SODIUM (PORCINE) 1000 UNIT/ML IJ SOLN
INTRAMUSCULAR | Status: AC
  Administered 2020-03-12: 1000 [IU]
  Filled 2020-03-12: qty 5

## 2020-03-12 MED ORDER — HYDRALAZINE HCL 10 MG PO TABS
10.0000 mg | ORAL_TABLET | Freq: Three times a day (TID) | ORAL | Status: DC
  Administered 2020-03-12 – 2020-03-15 (×7): 10 mg via ORAL
  Filled 2020-03-12 (×8): qty 1

## 2020-03-12 MED ORDER — MINOCYCLINE HCL 50 MG PO CAPS
100.0000 mg | ORAL_CAPSULE | Freq: Two times a day (BID) | ORAL | Status: DC
  Administered 2020-03-12 – 2020-03-15 (×6): 100 mg via ORAL
  Filled 2020-03-12: qty 2
  Filled 2020-03-12: qty 1
  Filled 2020-03-12: qty 2
  Filled 2020-03-12: qty 1
  Filled 2020-03-12 (×4): qty 2

## 2020-03-12 NOTE — Progress Notes (Signed)
PT Cancellation Note  Patient Details Name: [name] [name] MRN: [id number] DOB: 11-28-1965   Cancelled Treatment:    Reason Eval/Treat Not Completed: (P) Patient at procedure or test/unavailable Pt is off floor at hemodialysis. PT will follow back tomorrow for treatment.   [name] [name] Dk PT, DPT Acute Rehabilitation Services Pager [phone] Office [phone]    [place] 03/12/2020, 1:54 PM

## 2020-03-12 NOTE — Progress Notes (Addendum)
[facility] Progress Note   Subjective:   Patient seen in room, reports she is still not feeling very well, main complaint is central/epigastric abdominal pain. No fevers, chills, N/V/D. Denies constipation. No SOB, CP, palpitations, headaches or dizziness.   Objective Vitals:   03/11/20 1328 03/11/20 1600 03/11/20 2050 03/12/20 0818  BP: (!) 165/73  (!) 197/88 (!) 186/78  Pulse: 65 66 65 64  Resp: 15  12 12   Temp: 97.9 F (36.6 C)  98.2 F (36.8 C) 98 F (36.7 C)  TempSrc: Oral  Oral Oral  SpO2: 100% 100% 100% 100%  Weight:      Height:       Physical Exam General: Well developed female, alert and in NAD Heart: RRR, no murmurs, rubs or gallops Lungs: CTA bilaterally without wheezing, rhonchi or rales Abdomen: Soft, mild TTP epigastric region, no rebound tenderness/gaurding, non-distended, +BS Extremities: Trace pedal edema bilaterally Dialysis Access: L IJ [facility]  Additional Objective Labs: Basic Metabolic Panel: Recent Labs  Lab 03/07/20 1422 03/08/20 1327 03/09/20 0824 03/10/20 1324 03/12/20 0557  NA  --    < > 138 140 136  K  --    < > 4.1 5.3* 4.6  CL  --    < > 100 102 98  CO2  --    < > 20* 22 25  GLUCOSE  --    < > 123* 98 99  BUN  --    < > 27* 48* 34*  CREATININE 6.57*   < > 4.11* 5.63* 4.28*  CALCIUM  --    < > 8.4* 8.5* 8.8*  PHOS 8.2*  --   --  6.9*  --    < > = values in this interval not displayed.   Liver Function Tests: Recent Labs  Lab 03/08/20 1327 03/10/20 1324 03/12/20 0557  AST 69* 86* 43*  ALT 97* 104* 71*  ALKPHOS 113 116 101  BILITOT 0.6 0.7 0.7  PROT 6.5 6.5 6.4*  ALBUMIN 2.9* 2.8* 2.6*   Recent Labs  Lab 03/07/20 1128  LIPASE 61*   CBC: Recent Labs  Lab 03/07/20 1128 03/07/20 1145 03/07/20 1422 03/07/20 1422 03/08/20 1327 03/08/20 1327 03/09/20 0824 03/10/20 1324 03/12/20 0557  WBC 11.6*   < > 9.2   < > 11.7*   < > 10.4 7.9 6.1  NEUTROABS 7.9*  --   --   --   --   --   --  3.8 2.1  HGB 13.2   < >  14.1   < > 12.4   < > 13.3 13.3 13.0  HCT 44.5   < > 47.3*   < > 41.3   < > 44.6 42.8 43.2  MCV 85.9   < > 86.9  --  85.9  --  85.9 85.6 85.0  PLT 184   < > 182   < > 165   < > 174 159 162   < > = values in this interval not displayed.   Blood Culture    Component Value Date/Time   SDES BLOOD REJ 03/07/2020 1128   SPECREQUEST  03/07/2020 1128    BOTTLES DRAWN AEROBIC AND ANAEROBIC Blood Culture adequate volume   CULT  03/07/2020 1128    NO GROWTH 5 DAYS Performed at [facility], [place] [address] [place], [place] [address]    REPTSTATUS 03/12/2020 FINAL 03/07/2020 1128   CBG: Recent Labs  Lab 03/11/20 0804 03/11/20 1133 03/11/20 1608 03/11/20 2054  03/12/20 0816  GLUCAP 160* 124* 95 91 101*   Medications:  . amLODipine  10 mg Oral Daily  . amoxicillin  500 mg Oral q1800  . carvedilol  25 mg Oral BID  . Chlorhexidine Gluconate Cloth  6 each Topical Q0600  . doxycycline  100 mg Oral Q12H  . gabapentin  200 mg Oral QHS  . heparin  5,000 Units Subcutaneous Q8H  . insulin aspart  0-5 Units Subcutaneous QHS  . insulin aspart  0-6 Units Subcutaneous TID WC  . sevelamer carbonate  800 mg Oral TID WC    Dialysis Orders: [facility] MWF 3.5h 350/700 70.5kg L IJ TDC Hep 5000 then 500/hr   Assessment/Plan: 1. UTI - enterococcus faecalis per urine culture.  Seen by ID, recommending doxy and amoxicillin.  2. ESRD- on HD MWF. K+ at goal. Continue MWF schedule 3. BP/ vol - edema improved, BP remains elevated despite improvement in volume status. Continue UF as tolerated with HD. On metoprolol and carvedilol. Lostartan stopped 9/6. She was on PRN hydralazine prior to admission, will start low dose scheduled hydralazine here.  4. DM2 insulin- dependent - w/ sig complications 5. Anemia ckd - Hb13, no ESA indicated at this time. 6. MBD ckd -Calcium controlled, phos 8.2 >6.9. Started renvela 1 per meal which can cause some GI upset, will hold and see  if Gi symptoms improve. May need to try a different binder.   Anice Paganini, PA-C 03/12/2020, 9:36 AM  [facility] Pager: [phone]  Pt seen, examined and agree w A/P as above.  Kelly Splinter  MD 03/12/2020, 3:07 PM

## 2020-03-12 NOTE — Progress Notes (Signed)
OT Cancellation Note  Patient Details Name: [name] [name] MRN: [id number] DOB: May 25, 1966   Cancelled Treatment:    Reason Eval/Treat Not Completed: Patient at procedure or test/ unavailable  Richard D Popella 03/12/2020, 1:56 PM  03/12/2020  Denice Paradise, OTR/L  Acute Rehabilitation Services  Office:  [phone]

## 2020-03-12 NOTE — TOC Progression Note (Addendum)
Transition of Care [facility]) - Progression Note    Patient Details  Name: [name] [name] MRN: [id number] Date of Birth: 1966/04/25  Transition of Care [facility]) CM/SW [place], [place] Phone Number: 03/12/2020, 12:20 PM  Clinical Narrative:     Update 9/8 12:50pm-CSW updated [employer] with facility choice for patient. Insurance authorization has been approved. Auth ID # is [id number]. Next review date is Monday 9/13.  Patient has SNF bed at [facility] when medically ready. Insurance authorization has been approved.  CSW will continue to follow.  CSW spoke with patient and patient has chose SNF placement at [facility]. CSW called [facility] and they confirmed that they can accept patient for SNF placement when medically ready. [facility] confirmed with CSW that Education officer, museum there will help with safe plan home for patient when ready to discharge from facility.CSW coordinated with facility as well as Renal Navigator Terri Piedra for HD for patient. CSW started insurance authorization for patient. Reference number is #[id number].  Patient has SNF bed at [facility]. CSW started insurance authorization for patient.  CSW will continue to follow.  Expected Discharge Plan: [place] Barriers to Discharge: Continued Medical Work up  Expected Discharge Plan and Services Expected Discharge Plan: [place] arrangements for the past 2 months: [place]                                       Social Determinants of Health (SDOH) Interventions    Readmission Risk Interventions No flowsheet data found.

## 2020-03-12 NOTE — Progress Notes (Signed)
Pt is on amox and doxy for the enterococcus and staph lugdunensis UTI. Doxy doesn't get in the urine well but minocycline can get in there about 10% which is sufficient for treatment. Ok to switch per Dr. Linus Salmons.  Onnie Boer, PharmD, BCIDP, AAHIVP, CPP Infectious Disease Pharmacist 03/12/2020 3:03 PM

## 2020-03-12 NOTE — Progress Notes (Signed)
PROGRESS NOTE    [name] [name]  EQA:[id number] DOB: 13-Jun-1966 DOA: 03/07/2020 PCP: Biagio Borg, MD     Brief Narrative:  [name] Plant Wrightis a 54 y.o.femalewith medical history significant ofhypertension, insulin-dependent type 2 diabetes, diabetic neuropathy, ESRD on hemodialysis MWF, NAFLD,chronic bilateral lower extremity weakness, recurrent ESBL Klebsiella UTI, history of pubic symphysis septic arthritis, osteomyelitis and extraperitoneal left thigh abscess status post I &D of left groin on 11/26/2019 brought by EMS from SNF to the emergency department for evaluation of AMS.  At rehab, was noted to be confused and unresponsive. EMS was called-her initial CBG was noted to be 44 and improved to 190. Blood pressure was noted to be 88/45 and was given a bolus of normal saline, BP increased to 106/68. Patient was brought to ER for further evaluation and management. In the ED, UA positive for pyuria.  Chest x-ray, troponin, CT head negative.  CT abdomen/pelvis concerning for acute cystitis. Patient received cefepime, vancomycin, Flagyl and Nalaxone in the ED. Urine culture growing Enterococcus faecalis and Staph Lugdunensis.  Blood cultures negative to date.  New events last 24 hours / Subjective: Complains of epigastric abdominal pain which she describes as squeezing pain.  She denies any vomiting.  Had a bowel movement that was normal 2 days ago.  Assessment & Plan:   Principal Problem:   Sepsis ([place]) Active Problems:   DM (diabetes mellitus), type 2, uncontrolled ([place])   Diabetic neuropathy ([place])   Essential hypertension   ESRD (end stage renal disease) on dialysis ([place])   Hyperkalemia   Acute cystitis   Acute metabolic encephalopathy   Pressure injury of skin   Sepsis secondary to Enterococcus faecalis, Staph Lugdunensis UTI -Urine culture growing Enterococcus faecalis, staph lugdunensis  -Doxycycline 100 mg bid for 7 days total thorugh 9/13 -Amoxicillin 500 mg TID for 7 days  total through 9/79  Acute metabolic encephalopathy likely secondary to hypoglycemia versus sepsis -CT head is negative for acute findings -Resolved   ESRD on hemodialysis MWF -Nephrology following  Insulin-dependent type 2 diabetes mellitus, with neuropathy  -Hemoglobin A1c 5.8 on 03/09/2020  -SSI  -Continue gabapentin  Essential hypertension -Coreg, amlodipine, hydralazine  Chronic bilateral lower extremity weakness -No nerve response in legs on EMG on 10/2018 -Fall precaution  Abdominal pain -KUB unremarkable -Lipase 25  -LFTs improving  -Nephrology is holding renvela as this can cause GI upset   In agreement with assessment of the pressure ulcer as below:  Pressure Injury 03/08/20 Stage 1 -  Intact skin with non-blanchable redness of a localized area usually over a bony prominence. (Active)  03/08/20 0400  Location:   Location Orientation:   Staging: Stage 1 -  Intact skin with non-blanchable redness of a localized area usually over a bony prominence.  Wound Description (Comments):   Present on Admission: Yes         DVT prophylaxis:  heparin injection 5,000 Units Start: 03/07/20 1430 SCDs Start: 03/07/20 1422  Code Status: Full Family Communication: None at bedside Disposition Plan:   Status is: Inpatient  Remains inpatient appropriate because:Inpatient level of care appropriate due to severity of illness   Dispo:  Patient From: Home  Planned Disposition: [place]  Expected discharge date: 03/13/20  Medically stable for discharge: No. Continue to have abdominal pain. KUB unremarkable. HD planned today.  Hopeful discharge to SNF 9/9.  Repeat Covid test ordered.   Consultants:   Nephrology  Infectious disease   Antimicrobials:  Anti-infectives (From admission, onward)  Start     Dose/Rate Route Frequency Ordered Stop   03/11/20 1500  vancomycin (VANCOREADY) IVPB 750 mg/150 mL  Status:  Discontinued        750 mg 150 mL/hr  over 60 Minutes Intravenous Every T-Th-Sa (Hemodialysis) 03/08/20 1326 03/08/20 1327   03/11/20 1000  doxycycline (VIBRAMYCIN) 50 MG capsule 50 mg  Status:  Discontinued       Note to Pharmacy: Staph Lugdunensis UTI   50 mg Oral Every 12 hours 03/11/20 0725 03/11/20 0935   03/11/20 1000  doxycycline (VIBRA-TABS) tablet 100 mg        100 mg Oral Every 12 hours 03/11/20 0935     03/10/20 2100  amoxicillin (AMOXIL) capsule 500 mg        500 mg Oral Daily-1800 03/10/20 1457     03/10/20 1200  vancomycin (VANCOREADY) IVPB 750 mg/150 mL  Status:  Discontinued        750 mg 150 mL/hr over 60 Minutes Intravenous Every M-W-F (Hemodialysis) 03/07/20 1346 03/08/20 1326   03/09/20 0730  Ampicillin-Sulbactam (UNASYN) 3 g in sodium chloride 0.9 % 100 mL IVPB  Status:  Discontinued        3 g 200 mL/hr over 30 Minutes Intravenous Every 12 hours 03/09/20 0627 03/10/20 1457   03/08/20 2000  ceFEPIme (MAXIPIME) 1 g in sodium chloride 0.9 % 100 mL IVPB  Status:  Discontinued        1 g 200 mL/hr over 30 Minutes Intravenous Every 24 hours 03/07/20 1628 03/09/20 0617   03/08/20 1500  vancomycin (VANCOREADY) IVPB 750 mg/150 mL  Status:  Discontinued        750 mg 150 mL/hr over 60 Minutes Intravenous Every T-Th-Sa (Hemodialysis) 03/08/20 1327 03/08/20 1457   03/07/20 1430  cefTRIAXone (ROCEPHIN) 2 g in sodium chloride 0.9 % 100 mL IVPB  Status:  Discontinued        2 g 200 mL/hr over 30 Minutes Intravenous Every 24 hours 03/07/20 1427 03/07/20 1445   03/07/20 1345  ceFEPIme (MAXIPIME) 1 g in sodium chloride 0.9 % 100 mL IVPB  Status:  Discontinued        1 g 200 mL/hr over 30 Minutes Intravenous Every 24 hours 03/07/20 1343 03/07/20 1628   03/07/20 1130  vancomycin (VANCOREADY) IVPB 1500 mg/300 mL        1,500 mg 150 mL/hr over 120 Minutes Intravenous  Once 03/07/20 1127 03/07/20 1720   03/07/20 1115  ceFEPIme (MAXIPIME) 2 g in sodium chloride 0.9 % 100 mL IVPB        2 g 200 mL/hr over 30 Minutes  Intravenous  Once 03/07/20 1100 03/07/20 1220   03/07/20 1115  metroNIDAZOLE (FLAGYL) IVPB 500 mg        500 mg 100 mL/hr over 60 Minutes Intravenous  Once 03/07/20 1100 03/07/20 1630   03/07/20 1115  vancomycin (VANCOCIN) IVPB 1000 mg/200 mL premix  Status:  Discontinued        1,000 mg 200 mL/hr over 60 Minutes Intravenous  Once 03/07/20 1100 03/07/20 1127        Objective: Vitals:   03/11/20 1600 03/11/20 2050 03/12/20 0818 03/12/20 1230  BP:  (!) 197/88 (!) 186/78   Pulse: 66 65 64   Resp:  12 12   Temp:  98.2 F (36.8 C) 98 F (36.7 C)   TempSrc:  Oral Oral   SpO2: 100% 100% 100% 100%  Weight:      Height:  Intake/Output Summary (Last 24 hours) at 03/12/2020 1334 Last data filed at 03/11/2020 1824 Gross per 24 hour  Intake 222 ml  Output --  Net 222 ml   Filed Weights   03/10/20 0532 03/11/20 0203 03/11/20 0545  Weight: 73.4 kg 72.9 kg 71.1 kg    Examination:  General exam: Appears calm and comfortable  Respiratory system: Clear to auscultation. Respiratory effort normal. No respiratory distress. No conversational dyspnea.  Cardiovascular system: S1 & S2 heard, RRR. No murmurs. No pedal edema. Gastrointestinal system: Abdomen is nondistended, soft and nontender  Central nervous system: Alert and oriented. No focal neurological deficits. Speech clear.  Extremities: Symmetric in appearance  Skin: No rashes, lesions or ulcers on exposed skin  Psychiatry: Judgement and insight appear normal. Mood & affect appropriate.   Data Reviewed: I have personally reviewed following labs and imaging studies  CBC: Recent Labs  Lab 03/07/20 1128 03/07/20 1145 03/07/20 1422 03/08/20 1327 03/09/20 0824 03/10/20 1324 03/12/20 0557  WBC 11.6*   < > 9.2 11.7* 10.4 7.9 6.1  NEUTROABS 7.9*  --   --   --   --  3.8 2.1  HGB 13.2   < > 14.1 12.4 13.3 13.3 13.0  HCT 44.5   < > 47.3* 41.3 44.6 42.8 43.2  MCV 85.9   < > 86.9 85.9 85.9 85.6 85.0  PLT 184   < > 182 165 174 159  162   < > = values in this interval not displayed.   Basic Metabolic Panel: Recent Labs  Lab 03/07/20 1128 03/07/20 1128 03/07/20 1145 03/07/20 1145 03/07/20 1422 03/08/20 1327 03/09/20 0824 03/10/20 1324 03/12/20 0557  NA 138   < > 137  --   --  139 138 140 136  K 5.3*   < > 5.0  --   --  4.1 4.1 5.3* 4.6  CL 102   < > 105  --   --  102 100 102 98  CO2 21*  --   --   --   --  22 20* 22 25  GLUCOSE 166*   < > 159*  --   --  149* 123* 98 99  BUN 63*   < > 57*  --   --  46* 27* 48* 34*  CREATININE 6.65*   < > 6.70*   < > 6.57* 5.27* 4.11* 5.63* 4.28*  CALCIUM 8.7*  --   --   --   --  8.1* 8.4* 8.5* 8.8*  MG  --   --   --   --  3.1*  --   --   --   --   PHOS  --   --   --   --  8.2*  --   --  6.9*  --    < > = values in this interval not displayed.   GFR: Estimated Creatinine Clearance: 13.4 mL/min (A) (by C-G formula based on SCr of 4.28 mg/dL (H)). Liver Function Tests: Recent Labs  Lab 03/07/20 1128 03/08/20 1327 03/10/20 1324 03/12/20 0557  AST 26 69* 86* 43*  ALT 27 97* 104* 71*  ALKPHOS 108 113 116 101  BILITOT 0.5 0.6 0.7 0.7  PROT 6.7 6.5 6.5 6.4*  ALBUMIN 3.1* 2.9* 2.8* 2.6*   Recent Labs  Lab 03/07/20 1128 03/12/20 0557  LIPASE 61* 25   Recent Labs  Lab 03/07/20 1128  AMMONIA 18   Coagulation Profile: Recent Labs  Lab 03/07/20 1128 03/08/20 1924  INR 1.1 1.1   Cardiac Enzymes: No results for input(s): CKTOTAL, CKMB, CKMBINDEX, TROPONINI in the last 168 hours. BNP (last 3 results) No results for input(s): PROBNP in the last 8760 hours. HbA1C: No results for input(s): HGBA1C in the last 72 hours. CBG: Recent Labs  Lab 03/11/20 1133 03/11/20 1608 03/11/20 2054 03/12/20 0816 03/12/20 1151  GLUCAP 124* 95 91 101* 118*   Lipid Profile: No results for input(s): CHOL, HDL, LDLCALC, TRIG, CHOLHDL, LDLDIRECT in the last 72 hours. Thyroid Function Tests: No results for input(s): TSH, T4TOTAL, FREET4, T3FREE, THYROIDAB in the last 72  hours. Anemia Panel: No results for input(s): VITAMINB12, FOLATE, FERRITIN, TIBC, IRON, RETICCTPCT in the last 72 hours. Sepsis Labs: Recent Labs  Lab 03/07/20 1128 03/07/20 1258 03/08/20 1327  PROCALCITON  --   --  6.82  LATICACIDVEN 0.9 0.8  --     Recent Results (from the past 240 hour(s))  Blood Culture (routine x 2)     Status: None   Collection Time: 03/07/20 11:03 AM   Specimen: BLOOD  Result Value Ref Range Status   Specimen Description BLOOD RIGHT THUMB  Final   Special Requests   Final    BOTTLES DRAWN AEROBIC AND ANAEROBIC Blood Culture adequate volume   Culture   Final    NO GROWTH 5 DAYS Performed at [facility], [address] [place], [place] [address]    Report Status 03/12/2020 FINAL  Final  Urine culture     Status: Abnormal   Collection Time: 03/07/20 11:23 AM   Specimen: In/Out Cath Urine  Result Value Ref Range Status   Specimen Description IN/OUT CATH URINE  Final   Special Requests   Final    NONE Performed at [facility], [place] [address], [place], [place] [address]    Culture (A)  Final    >=100,000 COLONIES/mL ENTEROCOCCUS FAECALIS >=100,000 COLONIES/mL STAPHYLOCOCCUS LUGDUNENSIS VANCOMYCIN RESISTANT ENTEROCOCCUS ISOLATED    Report Status 03/10/2020 FINAL  Final   Organism ID, Bacteria STAPHYLOCOCCUS LUGDUNENSIS (A)  Final   Organism ID, Bacteria ENTEROCOCCUS FAECALIS (A)  Final      Susceptibility   Enterococcus faecalis - MIC*    AMPICILLIN <=2 SENSITIVE Sensitive     NITROFURANTOIN <=16 SENSITIVE Sensitive     VANCOMYCIN >=32 RESISTANT Resistant     LINEZOLID 2 SENSITIVE Sensitive     * >=100,000 COLONIES/mL ENTEROCOCCUS FAECALIS   Staphylococcus lugdunensis - MIC*    CIPROFLOXACIN <=0.5 SENSITIVE Sensitive     GENTAMICIN <=0.5 SENSITIVE Sensitive     NITROFURANTOIN <=16 SENSITIVE Sensitive     OXACILLIN >=4 RESISTANT Resistant     TETRACYCLINE <=1 SENSITIVE Sensitive     VANCOMYCIN <=0.5 SENSITIVE Sensitive      TRIMETH/SULFA <=10 SENSITIVE Sensitive     CLINDAMYCIN RESISTANT Resistant     RIFAMPIN <=0.5 SENSITIVE Sensitive     Inducible Clindamycin POSITIVE Resistant     * >=100,000 COLONIES/mL STAPHYLOCOCCUS LUGDUNENSIS  Blood Culture (routine x 2)     Status: None   Collection Time: 03/07/20 11:28 AM   Specimen: BLOOD  Result Value Ref Range Status   Specimen Description BLOOD REJ  Final   Special Requests   Final    BOTTLES DRAWN AEROBIC AND ANAEROBIC Blood Culture adequate volume   Culture   Final    NO GROWTH 5 DAYS Performed at [facility], [address], [place], [place] [address]    Report Status 03/12/2020 FINAL  Final  SARS Coronavirus 2 by RT PCR (hospital order, performed in [facility] hospital lab) Nasopharyngeal Nasopharyngeal Swab     Status: None   Collection Time: 03/07/20 11:28 AM   Specimen: Nasopharyngeal Swab  Result Value Ref Range Status   SARS Coronavirus 2 NEGATIVE NEGATIVE Final    Comment: (NOTE) SARS-CoV-2 target nucleic acids are NOT DETECTED.  The SARS-CoV-2 RNA is generally detectable in upper and lower respiratory specimens during the acute phase of infection. The lowest concentration of SARS-CoV-2 viral copies this assay can detect is 250 copies / mL. A negative result does not preclude SARS-CoV-2 infection and should not be used as the sole basis for treatment or other patient management decisions.  A negative result may occur with improper specimen collection / handling, submission of specimen other than nasopharyngeal swab, presence of viral mutation(s) within the areas targeted by this assay, and inadequate number of viral copies (<250 copies / mL). A negative result must be combined with clinical observations, patient history, and epidemiological information.  Fact Sheet for Patients:   StrictlyIdeas.no  Fact Sheet for Healthcare Providers: BankingDealers.co.za  This test is not yet  approved or  cleared by the [place] FDA and has been authorized for detection and/or diagnosis of SARS-CoV-2 by FDA under an Emergency Use Authorization (EUA).  This EUA will remain in effect (meaning this test can be used) for the duration of the COVID-19 declaration under Section 564(b)(1) of the Act, 21 U.S.C. section 360bbb-3(b)(1), unless the authorization is terminated or revoked sooner.  Performed at [facility], [place] [address] [place], [place] [address]   MRSA PCR Screening     Status: None   Collection Time: 03/08/20  6:14 AM   Specimen: Nasopharyngeal  Result Value Ref Range Status   MRSA by PCR NEGATIVE NEGATIVE Final    Comment:        The GeneXpert MRSA Assay (FDA approved for NASAL specimens only), is one component of a comprehensive MRSA colonization surveillance program. It is not intended to diagnose MRSA infection nor to guide or monitor treatment for MRSA infections. Performed at [facility], [place] [address], [place], [place] [address]       Radiology Studies: DG Abd 1 View  Result Date: 03/12/2020 CLINICAL DATA:  Abdominal pain and nausea. EXAM: ABDOMEN - 1 VIEW COMPARISON:  CT abdomen pelvis dated March 07, 2020. FINDINGS: The bowel gas pattern is normal. No radio-opaque calculi or other significant radiographic abnormality are seen. Prior cholecystectomy. No acute osseous abnormality. IMPRESSION: 1. Negative. Electronically Signed   By: Titus Dubin M.D.   On: 03/12/2020 10:46      Scheduled Meds: . amLODipine  10 mg Oral Daily  . amoxicillin  500 mg Oral q1800  . carvedilol  25 mg Oral BID  . Chlorhexidine Gluconate Cloth  6 each Topical Q0600  . doxycycline  100 mg Oral Q12H  . gabapentin  200 mg Oral QHS  . heparin  5,000 Units Subcutaneous Q8H  . hydrALAZINE  10 mg Oral Q8H  . insulin aspart  0-5 Units Subcutaneous QHS  . insulin aspart  0-6 Units Subcutaneous TID WC   Continuous Infusions:   LOS: 5 days       Time spent: 35 minutes   Dessa Phi, DO [facility] 03/12/2020, 1:34 PM   Available via Epic secure chat 7am-7pm After these hours, please refer to coverage provider listed on [email or web address]

## 2020-03-13 LAB — BASIC METABOLIC PANEL
Anion gap: 13 (ref 5–15)
BUN: 25 mg/dL — ABNORMAL HIGH (ref 6–20)
CO2: 26 mmol/L (ref 22–32)
Calcium: 9.1 mg/dL (ref 8.9–10.3)
Chloride: 101 mmol/L (ref 98–111)
Creatinine, Ser: 3.81 mg/dL — ABNORMAL HIGH (ref 0.44–1.00)
GFR calc Af Amer: 15 mL/min — ABNORMAL LOW (ref >60)
GFR calc non Af Amer: 13 mL/min — ABNORMAL LOW (ref >60)
Glucose, Bld: 108 mg/dL — ABNORMAL HIGH (ref 70–99)
Potassium: 4.2 mmol/L (ref 3.5–5.1)
Sodium: 140 mmol/L (ref 135–145)

## 2020-03-13 LAB — GLUCOSE, CAPILLARY
Glucose-Capillary: 106 mg/dL — ABNORMAL HIGH (ref 70–99)
Glucose-Capillary: 128 mg/dL — ABNORMAL HIGH (ref 70–99)
Glucose-Capillary: 139 mg/dL — ABNORMAL HIGH (ref 70–99)
Glucose-Capillary: 137 mg/dL — ABNORMAL HIGH (ref 70–99)

## 2020-03-13 LAB — CBC
HCT: 43.9 % (ref 36.0–46.0)
Hemoglobin: 13.4 g/dL (ref 12.0–15.0)
MCH: 26.2 pg (ref 26.0–34.0)
MCHC: 30.5 g/dL (ref 30.0–36.0)
MCV: 85.9 fL (ref 80.0–100.0)
Platelets: 157 10*3/uL (ref 150–400)
RBC: 5.11 MIL/uL (ref 3.87–5.11)
RDW: 18 % — ABNORMAL HIGH (ref 11.5–15.5)
WBC: 5.6 10*3/uL (ref 4.0–10.5)
nRBC: 0 % (ref 0.0–0.2)

## 2020-03-13 LAB — HEPATIC FUNCTION PANEL
ALT: 59 U/L — ABNORMAL HIGH (ref 0–44)
AST: 39 U/L (ref 15–41)
Albumin: 2.9 g/dL — ABNORMAL LOW (ref 3.5–5.0)
Alkaline Phosphatase: 104 U/L (ref 38–126)
Bilirubin, Direct: 0.1 mg/dL (ref 0.0–0.2)
Indirect Bilirubin: 0.7 mg/dL (ref 0.3–0.9)
Total Bilirubin: 0.8 mg/dL (ref 0.3–1.2)
Total Protein: 6.8 g/dL (ref 6.5–8.1)

## 2020-03-13 LAB — SARS CORONAVIRUS 2 BY RT PCR (HOSPITAL ORDER, PERFORMED IN ~~LOC~~ HOSPITAL LAB): SARS Coronavirus 2: NEGATIVE

## 2020-03-13 MED ORDER — METOCLOPRAMIDE HCL 5 MG/ML IJ SOLN
5.0000 mg | Freq: Three times a day (TID) | INTRAMUSCULAR | Status: DC
  Administered 2020-03-13 – 2020-03-15 (×6): 5 mg via INTRAVENOUS
  Filled 2020-03-13 (×6): qty 2

## 2020-03-13 MED ORDER — CHLORHEXIDINE GLUCONATE CLOTH 2 % EX PADS
6.0000 | MEDICATED_PAD | Freq: Every day | CUTANEOUS | Status: DC
  Administered 2020-03-14 – 2020-03-15 (×2): 6 via TOPICAL

## 2020-03-13 MED ORDER — SEVELAMER CARBONATE 800 MG PO TABS
800.0000 mg | ORAL_TABLET | Freq: Three times a day (TID) | ORAL | Status: DC
  Administered 2020-03-13 – 2020-03-14 (×3): 800 mg via ORAL
  Filled 2020-03-13 (×3): qty 1

## 2020-03-13 NOTE — Progress Notes (Addendum)
[facility] Progress Note   Subjective:   Patient seen in room. No improvement in GI symptoms after holding phosphorus binder, still having epigastric pain and nausea. Denies SOB, CP, palpitations, dizziness. Peripheral edema improving.   Objective Vitals:   03/12/20 1630 03/12/20 1742 03/12/20 2041 03/13/20 0437  BP: (!) 152/84 (!) 163/80 (!) 167/77 (!) 180/75  Pulse:  71 70 (!) 56  Resp: 14 16 13 13   Temp: (!) 97.5 F (36.4 C) 98.2 F (36.8 C) 98.2 F (36.8 C) 97.6 F (36.4 C)  TempSrc: Oral Oral Oral Axillary  SpO2: 100% 100% 100% 100%  Weight: 67.9 kg   67.4 kg  Height:       Physical Exam General: Well developed female, alert and in NAD Heart: RRR, no murmurs, rubs or gallops Lungs: CTA bilaterally without wheezing, rhonchi or rales Abdomen: Soft, TTP epigastric region, +BS, no rebound tenderness/gaurding Extremities: Trace edema b/ lower extremities Dialysis Access:  [facility]  Additional Objective Labs: Basic Metabolic Panel: Recent Labs  Lab 03/07/20 1422 03/08/20 1327 03/09/20 0824 03/10/20 1324 03/12/20 0557  NA  --    < > 138 140 136  K  --    < > 4.1 5.3* 4.6  CL  --    < > 100 102 98  CO2  --    < > 20* 22 25  GLUCOSE  --    < > 123* 98 99  BUN  --    < > 27* 48* 34*  CREATININE 6.57*   < > 4.11* 5.63* 4.28*  CALCIUM  --    < > 8.4* 8.5* 8.8*  PHOS 8.2*  --   --  6.9*  --    < > = values in this interval not displayed.   Liver Function Tests: Recent Labs  Lab 03/08/20 1327 03/10/20 1324 03/12/20 0557  AST 69* 86* 43*  ALT 97* 104* 71*  ALKPHOS 113 116 101  BILITOT 0.6 0.7 0.7  PROT 6.5 6.5 6.4*  ALBUMIN 2.9* 2.8* 2.6*   Recent Labs  Lab 03/07/20 1128 03/12/20 0557  LIPASE 61* 25   CBC: Recent Labs  Lab 03/07/20 1128 03/07/20 1145 03/07/20 1422 03/07/20 1422 03/08/20 1327 03/08/20 1327 03/09/20 0824 03/10/20 1324 03/12/20 0557  WBC 11.6*   < > 9.2   < > 11.7*   < > 10.4 7.9 6.1  NEUTROABS 7.9*  --   --   --    --   --   --  3.8 2.1  HGB 13.2   < > 14.1   < > 12.4   < > 13.3 13.3 13.0  HCT 44.5   < > 47.3*   < > 41.3   < > 44.6 42.8 43.2  MCV 85.9   < > 86.9  --  85.9  --  85.9 85.6 85.0  PLT 184   < > 182   < > 165   < > 174 159 162   < > = values in this interval not displayed.   Blood Culture    Component Value Date/Time   SDES BLOOD REJ 03/07/2020 1128   SPECREQUEST  03/07/2020 1128    BOTTLES DRAWN AEROBIC AND ANAEROBIC Blood Culture adequate volume   CULT  03/07/2020 1128    NO GROWTH 5 DAYS Performed at [facility], [place] [address] [place], [place] [address]    REPTSTATUS 03/12/2020 FINAL 03/07/2020 1128    CBG: Recent Labs  Lab  03/12/20 0816 03/12/20 1151 03/12/20 1739 03/12/20 2111 03/13/20 0809  GLUCAP 101* 118* 105* 134* 106*    Studies/Results: DG Abd 1 View  Result Date: 03/12/2020 CLINICAL DATA:  Abdominal pain and nausea. EXAM: ABDOMEN - 1 VIEW COMPARISON:  CT abdomen pelvis dated March 07, 2020. FINDINGS: The bowel gas pattern is normal. No radio-opaque calculi or other significant radiographic abnormality are seen. Prior cholecystectomy. No acute osseous abnormality. IMPRESSION: 1. Negative. Electronically Signed   By: Titus Dubin M.D.   On: 03/12/2020 10:46   Medications:  . amLODipine  10 mg Oral Daily  . amoxicillin  500 mg Oral q1800  . carvedilol  25 mg Oral BID  . Chlorhexidine Gluconate Cloth  6 each Topical Q0600  . gabapentin  200 mg Oral QHS  . heparin  5,000 Units Subcutaneous Q8H  . hydrALAZINE  10 mg Oral Q8H  . insulin aspart  0-5 Units Subcutaneous QHS  . insulin aspart  0-6 Units Subcutaneous TID WC  . metoCLOPramide (REGLAN) injection  5 mg Intravenous Q8H  . minocycline  100 mg Oral BID    Dialysis Orders: [facility] Dr MWF 3.5h 350/700 70.5kg L IJ TDC Hep 5000 then 500/hr  Assessment/Plan: 1. UTI - enterococcus faecalis per urine culture.  Seen by ID, recommending doxy and amoxicillin.   2. ESRD- on HD MWF.K+ at goal. Continue MWF schedule 3. BP/ vol - edema improved, BP remains elevated despite improvement in volume status. Continue UF as tolerated with HD. On metoprolol and carvedilol. Lostartan stopped 9/6. She was on PRN hydralazine prior to admission, started hydralazine 10mg  TID on 03/12/20, titrate dose PRN.  4. DM2 insulin- dependent - w/ sig complications 5. Anemia ckd - Hb13, no ESA indicated at this time. 6. MBD ckd -Calcium controlled, phos 8.2>6.9. Started renvela 1 per meal but then held with no change in GI sx, will resume. Getting reglan. Further management of abdominal pain per primary team.  7. Dispo - ok for dc from renal standpoint  Anice Paganini, PA-C 03/13/2020, 10:32 AM  [facility] Pager: [phone]  Pt seen, examined and agree w assess/plan as above with additions as indicated.  [facility] 03/13/2020, 11:52 AM

## 2020-03-13 NOTE — Progress Notes (Signed)
Physical Therapy Treatment Patient Details Name: [name] [name] MRN: [id number] DOB: 1966-04-13 Today's Date: 03/13/2020    History of Present Illness Patient is a 54 y/o female who presents from [facility] with AMS; found to have hypoglycemia, metabolic encephalopathy and sepsis secondary to UTI. CT abdomen- acute cystitis. PMH includes neuropathy, HTN, DM, mitral regurgitation.    PT Comments    Pt with c/o of stomach pains on entry, agreeable to work with therapy as able. Pt also limited in safe mobility by decreased strength and balance. Pt is modAx2 for bed mobility and sit<>stand. Pt with increasing nausea with positional change and request to return to bed after standing. RN notified of onset of nausea. PT will continue to follow acutely to progress mobility.    Follow Up Recommendations  CIR;Supervision for mobility/OOB;Supervision/Assistance - 24 hour     Equipment Recommendations  None recommended by PT       Precautions / Restrictions Precautions Precautions: Fall Restrictions Weight Bearing Restrictions: No    Mobility  Bed Mobility Overal bed mobility: Needs Assistance Bed Mobility: Supine to Sit;Sit to Supine     Supine to sit: Mod assist;+2 for physical assistance;HOB elevated Sit to supine: Mod assist;+2 for physical assistance   General bed mobility comments: modA and maximal cues for moving LE to EoB, modA for bringing trunk to upright, pt able to scoot hips to EoB with cuing, increased nausea at [facility] modAx2 for bringing LE back to bed and scooting up in bed   Transfers Overall transfer level: Needs assistance Equipment used: Rolling walker (2 wheeled) Transfers: Sit to/from [facility] Sit to Stand: Mod assist;+2 physical assistance         General transfer comment: pt agreeable to attempt to stand, requiring modAx2 for power up and steadying. Pt able to reach standing but quickly asks to return to seated due to nausea    Ambulation/Gait             General Gait Details: pre gait capable only       Balance Overall balance assessment: Needs assistance Sitting-balance support: Feet supported;No upper extremity supported Sitting balance-Leahy Scale: Fair     Standing balance support: During functional activity;Bilateral upper extremity supported Standing balance-Leahy Scale: Poor Standing balance comment: requires B UE support and external support                             Cognition Arousal/Alertness: Awake/alert Behavior During Therapy: WFL for tasks assessed/performed Overall Cognitive Status: Within Functional Limits for tasks assessed                                           General Comments General comments (skin integrity, edema, etc.): VSS pt with increasing nausea during session       Pertinent Vitals/Pain Pain Assessment: 0-10 Pain Score: 7  Pain Location: stomach  Pain Descriptors / Indicators: Tightness;Grimacing           PT Goals (current goals can now be found in the care plan section) Acute Rehab PT Goals Patient Stated Goal: to be able to walk again PT Goal Formulation: With patient Time For Goal Achievement: 03/23/20 Potential to Achieve Goals: Good Progress towards PT goals: Not progressing toward goals - comment (limited by nausea)    Frequency    Min 3X/week  PT Plan Current plan remains appropriate       AM-PAC PT "6 Clicks" Mobility   Outcome Measure  Help needed turning from your back to your side while in a flat bed without using bedrails?: A Lot Help needed moving from lying on your back to sitting on the side of a flat bed without using bedrails?: A Lot Help needed moving to and from a bed to a chair (including a wheelchair)?: Total Help needed standing up from a chair using your arms (e.g., wheelchair or bedside chair)?: A Lot Help needed to walk in hospital room?: Total Help needed climbing 3-5 steps  with a railing? : Total 6 Click Score: 9    End of Session   Activity Tolerance: Patient limited by pain;Other (comment) (Nausea) Patient left: with call bell/phone within reach;in bed;with bed alarm set Nurse Communication: Mobility status PT Visit Diagnosis: Muscle weakness (generalized) (M62.81);Unsteadiness on feet (R26.81);Difficulty in walking, not elsewhere classified (R26.2)     Time: 3299-2426 PT Time Calculation (min) (ACUTE ONLY): 19 min  Charges:  $Therapeutic Activity: 8-22 mins                     Jesusmanuel Erbes B. [name] Dk PT, DPT Acute Rehabilitation Services Pager [phone] Office [phone]    [place] 03/13/2020, 3:09 PM

## 2020-03-13 NOTE — Progress Notes (Signed)
PROGRESS NOTE    [name] [name]  LKG:[id number] DOB: Feb 05, 1966 DOA: 03/07/2020 PCP: Biagio Borg, MD     Brief Narrative:  [name] Palau Wrightis a 54 y.o.femalewith medical history significant ofhypertension, insulin-dependent type 2 diabetes, diabetic neuropathy, ESRD on hemodialysis MWF, NAFLD,chronic bilateral lower extremity weakness, recurrent ESBL Klebsiella UTI, history of pubic symphysis septic arthritis, osteomyelitis and extraperitoneal left thigh abscess status post I &D of left groin on 11/26/2019 brought by EMS from SNF to the emergency department for evaluation of AMS.  At rehab, was noted to be confused and unresponsive. EMS was called-her initial CBG was noted to be 44 and improved to 190. Blood pressure was noted to be 88/45 and was given a bolus of normal saline, BP increased to 106/68. Patient was brought to ER for further evaluation and management. In the ED, UA positive for pyuria.  Chest x-ray, troponin, CT head negative.  CT abdomen/pelvis concerning for acute cystitis. Patient received cefepime, vancomycin, Flagyl and Nalaxone in the ED. Urine culture growing Enterococcus faecalis and Staph Lugdunensis.  Blood cultures negative to date.  New events last 24 hours / Subjective: Continues to have intermittent epigastric abdominal pain with some nausea without vomiting.  Assessment & Plan:   Principal Problem:   Sepsis ([place]) Active Problems:   DM (diabetes mellitus), type 2, uncontrolled ([place])   Diabetic neuropathy ([place])   Essential hypertension   ESRD (end stage renal disease) on dialysis ([place])   Hyperkalemia   Acute cystitis   Acute metabolic encephalopathy   Pressure injury of skin   Sepsis secondary to Enterococcus faecalis, Staph Lugdunensis UTI, POA  -Urine culture growing Enterococcus faecalis, staph lugdunensis  -Minocycline 100 mg bid for 7 days total thorugh 9/13 -Amoxicillin 500 mg TID for 7 days total through 6/64  Acute metabolic encephalopathy  likely secondary to hypoglycemia versus sepsis -CT head is negative for acute findings -Resolved   ESRD on hemodialysis MWF -Nephrology following  Insulin-dependent type 2 diabetes mellitus, with neuropathy  -Hemoglobin A1c 5.8 on 03/09/2020  -SSI  -Continue gabapentin  Essential hypertension -Coreg, amlodipine, hydralazine  Chronic bilateral lower extremity weakness -No nerve response in legs on EMG on 10/2018 -Fall precaution  Abdominal pain -KUB unremarkable -Lipase 25  -LFTs improving  -Did not improve by holding Renvela, resumed -?  Gastroparesis.  Trial of Reglan   In agreement with assessment of the pressure ulcer as below:  Pressure Injury 03/08/20 Stage 1 -  Intact skin with non-blanchable redness of a localized area usually over a bony prominence. (Active)  03/08/20 0400  Location:   Location Orientation:   Staging: Stage 1 -  Intact skin with non-blanchable redness of a localized area usually over a bony prominence.  Wound Description (Comments):   Present on Admission: Yes  POA       DVT prophylaxis:  heparin injection 5,000 Units Start: 03/07/20 1430 SCDs Start: 03/07/20 1422  Code Status: Full Family Communication: None at bedside Disposition Plan:   Status is: Inpatient  Remains inpatient appropriate because:Inpatient level of care appropriate due to severity of illness   Dispo:  Patient From: Home  Planned Disposition: [place]  Expected discharge date: 03/14/20  Medically stable for discharge: No. Continue to have abdominal pain. Trial IV reglan. Hopeful discharge to SNF 9/10.    Consultants:   Nephrology  Infectious disease   Antimicrobials:  Anti-infectives (From admission, onward)   Start     Dose/Rate Route Frequency Ordered Stop   03/12/20 1530  minocycline (MINOCIN) capsule 100 mg        100 mg Oral 2 times daily 03/12/20 1501 03/19/20 0959   03/11/20 1500  vancomycin (VANCOREADY) IVPB 750 mg/150 mL   Status:  Discontinued        750 mg 150 mL/hr over 60 Minutes Intravenous Every T-Th-Sa (Hemodialysis) 03/08/20 1326 03/08/20 1327   03/11/20 1000  doxycycline (VIBRAMYCIN) 50 MG capsule 50 mg  Status:  Discontinued       Note to Pharmacy: Staph Lugdunensis UTI   50 mg Oral Every 12 hours 03/11/20 0725 03/11/20 0935   03/11/20 1000  doxycycline (VIBRA-TABS) tablet 100 mg  Status:  Discontinued        100 mg Oral Every 12 hours 03/11/20 0935 03/12/20 1501   03/10/20 2100  amoxicillin (AMOXIL) capsule 500 mg        500 mg Oral Daily-1800 03/10/20 1457 03/15/20 2359   03/10/20 1200  vancomycin (VANCOREADY) IVPB 750 mg/150 mL  Status:  Discontinued        750 mg 150 mL/hr over 60 Minutes Intravenous Every M-W-F (Hemodialysis) 03/07/20 1346 03/08/20 1326   03/09/20 0730  Ampicillin-Sulbactam (UNASYN) 3 g in sodium chloride 0.9 % 100 mL IVPB  Status:  Discontinued        3 g 200 mL/hr over 30 Minutes Intravenous Every 12 hours 03/09/20 0627 03/10/20 1457   03/08/20 2000  ceFEPIme (MAXIPIME) 1 g in sodium chloride 0.9 % 100 mL IVPB  Status:  Discontinued        1 g 200 mL/hr over 30 Minutes Intravenous Every 24 hours 03/07/20 1628 03/09/20 0617   03/08/20 1500  vancomycin (VANCOREADY) IVPB 750 mg/150 mL  Status:  Discontinued        750 mg 150 mL/hr over 60 Minutes Intravenous Every T-Th-Sa (Hemodialysis) 03/08/20 1327 03/08/20 1457   03/07/20 1430  cefTRIAXone (ROCEPHIN) 2 g in sodium chloride 0.9 % 100 mL IVPB  Status:  Discontinued        2 g 200 mL/hr over 30 Minutes Intravenous Every 24 hours 03/07/20 1427 03/07/20 1445   03/07/20 1345  ceFEPIme (MAXIPIME) 1 g in sodium chloride 0.9 % 100 mL IVPB  Status:  Discontinued        1 g 200 mL/hr over 30 Minutes Intravenous Every 24 hours 03/07/20 1343 03/07/20 1628   03/07/20 1130  vancomycin (VANCOREADY) IVPB 1500 mg/300 mL        1,500 mg 150 mL/hr over 120 Minutes Intravenous  Once 03/07/20 1127 03/07/20 1720   03/07/20 1115  ceFEPIme  (MAXIPIME) 2 g in sodium chloride 0.9 % 100 mL IVPB        2 g 200 mL/hr over 30 Minutes Intravenous  Once 03/07/20 1100 03/07/20 1220   03/07/20 1115  metroNIDAZOLE (FLAGYL) IVPB 500 mg        500 mg 100 mL/hr over 60 Minutes Intravenous  Once 03/07/20 1100 03/07/20 1630   03/07/20 1115  vancomycin (VANCOCIN) IVPB 1000 mg/200 mL premix  Status:  Discontinued        1,000 mg 200 mL/hr over 60 Minutes Intravenous  Once 03/07/20 1100 03/07/20 1127       Objective: Vitals:   03/12/20 1630 03/12/20 1742 03/12/20 2041 03/13/20 0437  BP: (!) 152/84 (!) 163/80 (!) 167/77 (!) 180/75  Pulse:  71 70 (!) 56  Resp: 14 16 13 13   Temp: (!) 97.5 F (36.4 C) 98.2 F (36.8 C) 98.2 F (36.8 C) 97.6 F (36.4  C)  TempSrc: Oral Oral Oral Axillary  SpO2: 100% 100% 100% 100%  Weight: 67.9 kg   67.4 kg  Height:        Intake/Output Summary (Last 24 hours) at 03/13/2020 1400 Last data filed at 03/12/2020 1630 Gross per 24 hour  Intake --  Output 2500 ml  Net -2500 ml   Filed Weights   03/12/20 1308 03/12/20 1630 03/13/20 0437  Weight: 69.4 kg 67.9 kg 67.4 kg    Examination: General exam: Appears calm and comfortable  Respiratory system: Clear to auscultation. Respiratory effort normal. Cardiovascular system: S1 & S2 heard, RRR. No pedal edema. Gastrointestinal system: Abdomen is nondistended, soft and nontender, without guarding. Normal bowel sounds heard. Central nervous system: Alert and oriented. Non focal exam. Speech clear  Extremities: Symmetric in appearance bilaterally  Skin: No rashes, lesions or ulcers on exposed skin  Psychiatry: Judgement and insight appear stable. Mood & affect appropriate.    Data Reviewed: I have personally reviewed following labs and imaging studies  CBC: Recent Labs  Lab 03/07/20 1128 03/07/20 1145 03/08/20 1327 03/09/20 0824 03/10/20 1324 03/12/20 0557 03/13/20 0913  WBC 11.6*   < > 11.7* 10.4 7.9 6.1 5.6  NEUTROABS 7.9*  --   --   --  3.8 2.1   --   HGB 13.2   < > 12.4 13.3 13.3 13.0 13.4  HCT 44.5   < > 41.3 44.6 42.8 43.2 43.9  MCV 85.9   < > 85.9 85.9 85.6 85.0 85.9  PLT 184   < > 165 174 159 162 157   < > = values in this interval not displayed.   Basic Metabolic Panel: Recent Labs  Lab 03/07/20 1145 03/07/20 1422 03/08/20 1327 03/09/20 0824 03/10/20 1324 03/12/20 0557 03/13/20 0913  NA   < >  --  139 138 140 136 140  K   < >  --  4.1 4.1 5.3* 4.6 4.2  CL   < >  --  102 100 102 98 101  CO2   < >  --  22 20* 22 25 26   GLUCOSE   < >  --  149* 123* 98 99 108*  BUN   < >  --  46* 27* 48* 34* 25*  CREATININE   < > 6.57* 5.27* 4.11* 5.63* 4.28* 3.81*  CALCIUM   < >  --  8.1* 8.4* 8.5* 8.8* 9.1  MG  --  3.1*  --   --   --   --   --   PHOS  --  8.2*  --   --  6.9*  --   --    < > = values in this interval not displayed.   GFR: Estimated Creatinine Clearance: 14.6 mL/min (A) (by C-G formula based on SCr of 3.81 mg/dL (H)). Liver Function Tests: Recent Labs  Lab 03/07/20 1128 03/08/20 1327 03/10/20 1324 03/12/20 0557 03/13/20 0913  AST 26 69* 86* 43* 39  ALT 27 97* 104* 71* 59*  ALKPHOS 108 113 116 101 104  BILITOT 0.5 0.6 0.7 0.7 0.8  PROT 6.7 6.5 6.5 6.4* 6.8  ALBUMIN 3.1* 2.9* 2.8* 2.6* 2.9*   Recent Labs  Lab 03/07/20 1128 03/12/20 0557  LIPASE 61* 25   Recent Labs  Lab 03/07/20 1128  AMMONIA 18   Coagulation Profile: Recent Labs  Lab 03/07/20 1128 03/08/20 1924  INR 1.1 1.1   Cardiac Enzymes: No results for input(s): CKTOTAL, CKMB, CKMBINDEX, TROPONINI in the  last 168 hours. BNP (last 3 results) No results for input(s): PROBNP in the last 8760 hours. HbA1C: No results for input(s): HGBA1C in the last 72 hours. CBG: Recent Labs  Lab 03/12/20 1151 03/12/20 1739 03/12/20 2111 03/13/20 0809 03/13/20 1209  GLUCAP 118* 105* 134* 106* 128*   Lipid Profile: No results for input(s): CHOL, HDL, LDLCALC, TRIG, CHOLHDL, LDLDIRECT in the last 72 hours. Thyroid Function Tests: No results  for input(s): TSH, T4TOTAL, FREET4, T3FREE, THYROIDAB in the last 72 hours. Anemia Panel: No results for input(s): VITAMINB12, FOLATE, FERRITIN, TIBC, IRON, RETICCTPCT in the last 72 hours. Sepsis Labs: Recent Labs  Lab 03/07/20 1128 03/07/20 1258 03/08/20 1327  PROCALCITON  --   --  6.82  LATICACIDVEN 0.9 0.8  --     Recent Results (from the past 240 hour(s))  Blood Culture (routine x 2)     Status: None   Collection Time: 03/07/20 11:03 AM   Specimen: BLOOD  Result Value Ref Range Status   Specimen Description BLOOD RIGHT THUMB  Final   Special Requests   Final    BOTTLES DRAWN AEROBIC AND ANAEROBIC Blood Culture adequate volume   Culture   Final    NO GROWTH 5 DAYS Performed at [facility], [address] [place], [place] [address]    Report Status 03/12/2020 FINAL  Final  Urine culture     Status: Abnormal   Collection Time: 03/07/20 11:23 AM   Specimen: In/Out Cath Urine  Result Value Ref Range Status   Specimen Description IN/OUT CATH URINE  Final   Special Requests   Final    NONE Performed at [facility], [place] [address] [place], [place] [address]    Culture (A)  Final    >=100,000 COLONIES/mL ENTEROCOCCUS FAECALIS >=100,000 COLONIES/mL STAPHYLOCOCCUS LUGDUNENSIS VANCOMYCIN RESISTANT ENTEROCOCCUS ISOLATED    Report Status 03/10/2020 FINAL  Final   Organism ID, Bacteria STAPHYLOCOCCUS LUGDUNENSIS (A)  Final   Organism ID, Bacteria ENTEROCOCCUS FAECALIS (A)  Final      Susceptibility   Enterococcus faecalis - MIC*    AMPICILLIN <=2 SENSITIVE Sensitive     NITROFURANTOIN <=16 SENSITIVE Sensitive     VANCOMYCIN >=32 RESISTANT Resistant     LINEZOLID 2 SENSITIVE Sensitive     * >=100,000 COLONIES/mL ENTEROCOCCUS FAECALIS   Staphylococcus lugdunensis - MIC*    CIPROFLOXACIN <=0.5 SENSITIVE Sensitive     GENTAMICIN <=0.5 SENSITIVE Sensitive     NITROFURANTOIN <=16 SENSITIVE Sensitive     OXACILLIN >=4 RESISTANT Resistant     TETRACYCLINE <=1  SENSITIVE Sensitive     VANCOMYCIN <=0.5 SENSITIVE Sensitive     TRIMETH/SULFA <=10 SENSITIVE Sensitive     CLINDAMYCIN RESISTANT Resistant     RIFAMPIN <=0.5 SENSITIVE Sensitive     Inducible Clindamycin POSITIVE Resistant     * >=100,000 COLONIES/mL STAPHYLOCOCCUS LUGDUNENSIS  Blood Culture (routine x 2)     Status: None   Collection Time: 03/07/20 11:28 AM   Specimen: BLOOD  Result Value Ref Range Status   Specimen Description BLOOD REJ  Final   Special Requests   Final    BOTTLES DRAWN AEROBIC AND ANAEROBIC Blood Culture adequate volume   Culture   Final    NO GROWTH 5 DAYS Performed at [facility], [address], [place], [place] [address]    Report Status 03/12/2020 FINAL  Final  SARS Coronavirus 2 by RT PCR (hospital order, performed in [facility] hospital lab) Nasopharyngeal Nasopharyngeal  Swab     Status: None   Collection Time: 03/07/20 11:28 AM   Specimen: Nasopharyngeal Swab  Result Value Ref Range Status   SARS Coronavirus 2 NEGATIVE NEGATIVE Final    Comment: (NOTE) SARS-CoV-2 target nucleic acids are NOT DETECTED.  The SARS-CoV-2 RNA is generally detectable in upper and lower respiratory specimens during the acute phase of infection. The lowest concentration of SARS-CoV-2 viral copies this assay can detect is 250 copies / mL. A negative result does not preclude SARS-CoV-2 infection and should not be used as the sole basis for treatment or other patient management decisions.  A negative result may occur with improper specimen collection / handling, submission of specimen other than nasopharyngeal swab, presence of viral mutation(s) within the areas targeted by this assay, and inadequate number of viral copies (<250 copies / mL). A negative result must be combined with clinical observations, patient history, and epidemiological information.  Fact Sheet for Patients:   StrictlyIdeas.no  Fact Sheet for Healthcare  Providers: BankingDealers.co.za  This test is not yet approved or  cleared by the [place] FDA and has been authorized for detection and/or diagnosis of SARS-CoV-2 by FDA under an Emergency Use Authorization (EUA).  This EUA will remain in effect (meaning this test can be used) for the duration of the COVID-19 declaration under Section 564(b)(1) of the Act, 21 U.S.C. section 360bbb-3(b)(1), unless the authorization is terminated or revoked sooner.  Performed at [facility], [place] [address] [place], [place] [address]   MRSA PCR Screening     Status: None   Collection Time: 03/08/20  6:14 AM   Specimen: Nasopharyngeal  Result Value Ref Range Status   MRSA by PCR NEGATIVE NEGATIVE Final    Comment:        The GeneXpert MRSA Assay (FDA approved for NASAL specimens only), is one component of a comprehensive MRSA colonization surveillance program. It is not intended to diagnose MRSA infection nor to guide or monitor treatment for MRSA infections. Performed at [facility], [place] [address] [place], [place] [address]   SARS Coronavirus 2 by RT PCR (hospital order, performed in [facility] hospital lab) Nasopharyngeal Nasopharyngeal Swab     Status: None   Collection Time: 03/13/20 11:22 AM   Specimen: Nasopharyngeal Swab  Result Value Ref Range Status   SARS Coronavirus 2 NEGATIVE NEGATIVE Final    Comment: (NOTE) SARS-CoV-2 target nucleic acids are NOT DETECTED.  The SARS-CoV-2 RNA is generally detectable in upper and lower respiratory specimens during the acute phase of infection. The lowest concentration of SARS-CoV-2 viral copies this assay can detect is 250 copies / mL. A negative result does not preclude SARS-CoV-2 infection and should not be used as the sole basis for treatment or other patient management decisions.  A negative result may occur with improper specimen collection / handling, submission of specimen other than  nasopharyngeal swab, presence of viral mutation(s) within the areas targeted by this assay, and inadequate number of viral copies (<250 copies / mL). A negative result must be combined with clinical observations, patient history, and epidemiological information.  Fact Sheet for Patients:   StrictlyIdeas.no  Fact Sheet for Healthcare Providers: BankingDealers.co.za  This test is not yet approved or  cleared by the [place] FDA and has been authorized for detection and/or diagnosis of SARS-CoV-2 by FDA under an Emergency Use Authorization (EUA).  This EUA will remain in effect (meaning this test can be used) for the duration of the  COVID-19 declaration under Section 564(b)(1) of the Act, 21 U.S.C. section 360bbb-3(b)(1), unless the authorization is terminated or revoked sooner.  Performed at [facility], [place] [address] [place], [place] [address]       Radiology Studies: DG Abd 1 View  Result Date: 03/12/2020 CLINICAL DATA:  Abdominal pain and nausea. EXAM: ABDOMEN - 1 VIEW COMPARISON:  CT abdomen pelvis dated March 07, 2020. FINDINGS: The bowel gas pattern is normal. No radio-opaque calculi or other significant radiographic abnormality are seen. Prior cholecystectomy. No acute osseous abnormality. IMPRESSION: 1. Negative. Electronically Signed   By: Titus Dubin M.D.   On: 03/12/2020 10:46      Scheduled Meds: . amLODipine  10 mg Oral Daily  . amoxicillin  500 mg Oral q1800  . carvedilol  25 mg Oral BID  . Chlorhexidine Gluconate Cloth  6 each Topical Q0600  . Chlorhexidine Gluconate Cloth  6 each Topical Q0600  . gabapentin  200 mg Oral QHS  . heparin  5,000 Units Subcutaneous Q8H  . hydrALAZINE  10 mg Oral Q8H  . insulin aspart  0-5 Units Subcutaneous QHS  . insulin aspart  0-6 Units Subcutaneous TID WC  . metoCLOPramide (REGLAN) injection  5 mg Intravenous Q8H  . minocycline  100 mg Oral BID  . sevelamer  carbonate  800 mg Oral TID WC   Continuous Infusions:   LOS: 6 days      Time spent: 30 minutes   Dessa Phi, DO [facility] 03/13/2020, 2:00 PM   Available via Epic secure chat 7am-7pm After these hours, please refer to coverage provider listed on [email or web address]

## 2020-03-14 LAB — COMPREHENSIVE METABOLIC PANEL
ALT: 53 U/L — ABNORMAL HIGH (ref 0–44)
AST: 37 U/L (ref 15–41)
Albumin: 3 g/dL — ABNORMAL LOW (ref 3.5–5.0)
Alkaline Phosphatase: 97 U/L (ref 38–126)
Anion gap: 14 (ref 5–15)
BUN: 44 mg/dL — ABNORMAL HIGH (ref 6–20)
CO2: 26 mmol/L (ref 22–32)
Calcium: 9 mg/dL (ref 8.9–10.3)
Chloride: 100 mmol/L (ref 98–111)
Creatinine, Ser: 5.15 mg/dL — ABNORMAL HIGH (ref 0.44–1.00)
GFR calc Af Amer: 10 mL/min — ABNORMAL LOW (ref >60)
GFR calc non Af Amer: 9 mL/min — ABNORMAL LOW (ref >60)
Glucose, Bld: 157 mg/dL — ABNORMAL HIGH (ref 70–99)
Potassium: 4.5 mmol/L (ref 3.5–5.1)
Sodium: 140 mmol/L (ref 135–145)
Total Bilirubin: 0.5 mg/dL (ref 0.3–1.2)
Total Protein: 6.5 g/dL (ref 6.5–8.1)

## 2020-03-14 LAB — CBC WITH DIFFERENTIAL/PLATELET
Abs Immature Granulocytes: 0.01 10*3/uL (ref 0.00–0.07)
Basophils Absolute: 0.1 10*3/uL (ref 0.0–0.1)
Basophils Relative: 1 %
Eosinophils Absolute: 0.2 10*3/uL (ref 0.0–0.5)
Eosinophils Relative: 3 %
HCT: 42.3 % (ref 36.0–46.0)
Hemoglobin: 12.5 g/dL (ref 12.0–15.0)
Immature Granulocytes: 0 %
Lymphocytes Relative: 52 %
Lymphs Abs: 3.2 10*3/uL (ref 0.7–4.0)
MCH: 25.6 pg — ABNORMAL LOW (ref 26.0–34.0)
MCHC: 29.6 g/dL — ABNORMAL LOW (ref 30.0–36.0)
MCV: 86.5 fL (ref 80.0–100.0)
Monocytes Absolute: 0.7 10*3/uL (ref 0.1–1.0)
Monocytes Relative: 10 %
Neutro Abs: 2.2 10*3/uL (ref 1.7–7.7)
Neutrophils Relative %: 34 %
Platelets: 156 10*3/uL (ref 150–400)
RBC: 4.89 MIL/uL (ref 3.87–5.11)
RDW: 18 % — ABNORMAL HIGH (ref 11.5–15.5)
WBC: 6.3 10*3/uL (ref 4.0–10.5)
nRBC: 0 % (ref 0.0–0.2)

## 2020-03-14 LAB — GLUCOSE, CAPILLARY
Glucose-Capillary: 137 mg/dL — ABNORMAL HIGH (ref 70–99)
Glucose-Capillary: 119 mg/dL — ABNORMAL HIGH (ref 70–99)
Glucose-Capillary: 149 mg/dL — ABNORMAL HIGH (ref 70–99)
Glucose-Capillary: 151 mg/dL — ABNORMAL HIGH (ref 70–99)

## 2020-03-14 MED ORDER — FENTANYL CITRATE (PF) 100 MCG/2ML IJ SOLN: 12.5000 ug | INTRAMUSCULAR | Status: DC | PRN

## 2020-03-14 MED ORDER — HEPARIN SODIUM (PORCINE) 1000 UNIT/ML IJ SOLN
INTRAMUSCULAR | Status: AC
  Administered 2020-03-14: 3600 [IU]
  Filled 2020-03-14: qty 4

## 2020-03-14 MED ORDER — POLYETHYLENE GLYCOL 3350 17 G PO PACK
17.0000 g | PACK | Freq: Every day | ORAL | Status: DC
  Filled 2020-03-14 (×2): qty 1

## 2020-03-14 MED ORDER — DOCUSATE SODIUM 100 MG PO CAPS
100.0000 mg | ORAL_CAPSULE | Freq: Two times a day (BID) | ORAL | Status: DC
  Administered 2020-03-14 – 2020-03-15 (×3): 100 mg via ORAL
  Filled 2020-03-14 (×3): qty 1

## 2020-03-14 MED ORDER — ONDANSETRON HCL 4 MG/2ML IJ SOLN
INTRAMUSCULAR | Status: AC
  Administered 2020-03-14: 4 mg via INTRAVENOUS
  Filled 2020-03-14: qty 2

## 2020-03-14 NOTE — Progress Notes (Addendum)
[facility] Progress Note   Subjective: Seen on HD says she feels terrible. No specific complaints other than weak and tired. On HD per schedule.   Objective Vitals:   03/14/20 0830 03/14/20 0900 03/14/20 0930 03/14/20 1000  BP: (!) 143/82 (!) 93/58 123/83 (!) 161/66  Pulse: (!) 55 (!) 58 (!) 56 (!) 55  Resp:      Temp:      TempSrc:      SpO2:      Weight:      Height:       Physical Exam General: Chronically ill appearing female in NAD Heart: S1,S2 RRR No M/R/G Lungs: CTAB Anteriorly Abdomen: ND, NT active BS Extremities: Woody appearance BLE with trace to 1+pitting edema Dialysis Access: LIJ TDC blood lines connected, drsg CDI.    Additional Objective Labs: Basic Metabolic Panel: Recent Labs  Lab 03/07/20 1422 03/08/20 1327 03/10/20 1324 03/10/20 1324 03/12/20 0557 03/13/20 0913 03/14/20 0628  NA  --    < > 140   < > 136 140 140  K  --    < > 5.3*   < > 4.6 4.2 4.5  CL  --    < > 102   < > 98 101 100  CO2  --    < > 22   < > 25 26 26   GLUCOSE  --    < > 98   < > 99 108* 157*  BUN  --    < > 48*   < > 34* 25* 44*  CREATININE 6.57*   < > 5.63*   < > 4.28* 3.81* 5.15*  CALCIUM  --    < > 8.5*   < > 8.8* 9.1 9.0  PHOS 8.2*  --  6.9*  --   --   --   --    < > = values in this interval not displayed.   Liver Function Tests: Recent Labs  Lab 03/12/20 0557 03/13/20 0913 03/14/20 0628  AST 43* 39 37  ALT 71* 59* 53*  ALKPHOS 101 104 97  BILITOT 0.7 0.8 0.5  PROT 6.4* 6.8 6.5  ALBUMIN 2.6* 2.9* 3.0*   Recent Labs  Lab 03/07/20 1128 03/12/20 0557  LIPASE 61* 25   CBC: Recent Labs  Lab 03/09/20 0824 03/09/20 0824 03/10/20 1324 03/10/20 1324 03/12/20 0557 03/13/20 0913 03/14/20 0628  WBC 10.4   < > 7.9   < > 6.1 5.6 6.3  NEUTROABS  --   --  3.8  --  2.1  --  2.2  HGB 13.3   < > 13.3   < > 13.0 13.4 12.5  HCT 44.6   < > 42.8   < > 43.2 43.9 42.3  MCV 85.9  --  85.6  --  85.0 85.9 86.5  PLT 174   < > 159   < > 162 157 156   < >  = values in this interval not displayed.   Blood Culture    Component Value Date/Time   SDES BLOOD REJ 03/07/2020 1128   SPECREQUEST  03/07/2020 1128    BOTTLES DRAWN AEROBIC AND ANAEROBIC Blood Culture adequate volume   CULT  03/07/2020 1128    NO GROWTH 5 DAYS Performed at [facility], [place] [address], [place], [place] [address]    REPTSTATUS 03/12/2020 FINAL 03/07/2020 1128    Cardiac Enzymes: No results for input(s): CKTOTAL, CKMB, CKMBINDEX, TROPONINI in the last 168 hours. CBG: Recent  Labs  Lab 03/13/20 0809 03/13/20 1209 03/13/20 1729 03/13/20 2106 03/14/20 0633  GLUCAP 106* 128* 139* 137* 137*   Iron Studies: No results for input(s): IRON, TIBC, TRANSFERRIN, FERRITIN in the last 72 hours. @lablastinr3 @ Studies/Results: DG Abd 1 View  Result Date: 03/12/2020 CLINICAL DATA:  Abdominal pain and nausea. EXAM: ABDOMEN - 1 VIEW COMPARISON:  CT abdomen pelvis dated March 07, 2020. FINDINGS: The bowel gas pattern is normal. No radio-opaque calculi or other significant radiographic abnormality are seen. Prior cholecystectomy. No acute osseous abnormality. IMPRESSION: 1. Negative. Electronically Signed   By: Titus Dubin M.D.   On: 03/12/2020 10:46   Medications:  . amLODipine  10 mg Oral Daily  . amoxicillin  500 mg Oral q1800  . carvedilol  25 mg Oral BID  . Chlorhexidine Gluconate Cloth  6 each Topical Q0600  . Chlorhexidine Gluconate Cloth  6 each Topical Q0600  . docusate sodium  100 mg Oral BID  . gabapentin  200 mg Oral QHS  . heparin  5,000 Units Subcutaneous Q8H  . hydrALAZINE  10 mg Oral Q8H  . insulin aspart  0-5 Units Subcutaneous QHS  . insulin aspart  0-6 Units Subcutaneous TID WC  . metoCLOPramide (REGLAN) injection  5 mg Intravenous Q8H  . minocycline  100 mg Oral BID  . polyethylene glycol  17 g Oral Daily  . sevelamer carbonate  800 mg Oral TID WC   HD orders: [facility] MWF 3.5 hrs 350/700 70.5 kg. LIJ TDC -Heparin 5000 units IV initial  bolus then 500 units IV hourly-on heparin pump at OP center. Getting orders.   Assessment/Plan: 1. UTI - enterococcus faecalis per urine culture. Seen by ID, recommending doxy and amoxicillin.  2. ESRD- on HD MWF.K+ at goal.Continue MWF schedule 3. Hypertension/Volume-On HD attempting net UF 3.0 L. Still with BLE edema but lungs are clear. BP controlled. Lower volume as tolerated.  4. DMT2-per primary 5. Anemia ckd - Hb12.5, no ESA indicated at this time. 6. MBD ckd -Calcium controlled, phos 8.2>6.9.Started renvela 1 per meal but then held with no change in GI sx, will resume. Getting reglan. Further management of abdominal pain per primary team.  7. Dispo - ok for dc from renal standpoint. Per primary note, planning DC to SNF.   Donovin Kraemer H. Alarik Radu NP-C 03/14/2020, 10:25 AM  [employer] [phone]

## 2020-03-14 NOTE — Progress Notes (Signed)
Physical Therapy Treatment Patient Details Name: [name] [name] MRN: [id number] DOB: January 18, 1966 Today's Date: 03/14/2020    History of Present Illness Patient is a 54 y/o female who presents from [facility] with AMS; found to have hypoglycemia, metabolic encephalopathy and sepsis secondary to UTI. CT abdomen- acute cystitis. PMH includes neuropathy, HTN, DM, mitral regurgitation.    PT Comments    Today's skilled session continued to focus on mobility progression with pt standing x3 to RW with 2 person mod assist. Acute PT to continue during pt's hospital stay.   Follow Up Recommendations  CIR;Supervision for mobility/OOB;Supervision/Assistance - 24 hour     Equipment Recommendations  None recommended by PT    Recommendations for Other Services Rehab consult;OT consult     Precautions / Restrictions Precautions Precautions: Fall Restrictions Weight Bearing Restrictions: No    Mobility  Bed Mobility Overal bed mobility: Needs Assistance Bed Mobility: Supine to Sit;Sit to Supine     Supine to sit: Mod assist;HOB elevated;+2 for safety/equipment Sit to supine: Mod assist;+2 for safety/equipment;+2 for physical assistance   General bed mobility comments: with HOB elevated to 45 degrees, pads under pt used to facilitate pelvic movements with pt using rail to assit with trunk elevation into sitting. once seated pads used with cues/faciliation to pt for lateral weight shifting to fully scoot to edge of bed. with lying down bed was flat with assist to bring both legs up onto bed surface and second person to assist pt with trunk management. 2 person max assist to fully scoot pt to [facility] at end of session.  Transfers Overall transfer level: Needs assistance Equipment used: Rolling walker (2 wheeled) Transfers: Sit to/from Stand   Stand pivot transfers: Mod assist;+2 physical assistance       General transfer comment: sit<>stand bed <>RW x3 reps with mod assist x2 people. working  on upright posture in standing each time. Pt unable to lift either foot to attempt any pre-gait activity.      Cognition Arousal/Alertness: Awake/alert Behavior During Therapy: WFL for tasks assessed/performed Overall Cognitive Status: Within Functional Limits for tasks assessed                 Pertinent Vitals/Pain Pain Assessment: No/denies pain     PT Goals (current goals can now be found in the care plan section) Acute Rehab PT Goals Patient Stated Goal: to be able to walk again PT Goal Formulation: With patient Time For Goal Achievement: 03/23/20 Potential to Achieve Goals: Good Progress towards PT goals: Progressing toward goals    Frequency    Min 3X/week      PT Plan Current plan remains appropriate       AM-PAC PT "6 Clicks" Mobility   Outcome Measure  Help needed turning from your back to your side while in a flat bed without using bedrails?: A Lot Help needed moving from lying on your back to sitting on the side of a flat bed without using bedrails?: A Lot Help needed moving to and from a bed to a chair (including a wheelchair)?: Total Help needed standing up from a chair using your arms (e.g., wheelchair or bedside chair)?: A Lot Help needed to walk in hospital room?: Total Help needed climbing 3-5 steps with a railing? : Total 6 Click Score: 9    End of Session Equipment Utilized During Treatment: Gait belt Activity Tolerance: Patient tolerated treatment well;Patient limited by fatigue Patient left: in bed;with call bell/phone within reach;with bed alarm set Nurse Communication:  Mobility status PT Visit Diagnosis: Muscle weakness (generalized) (M62.81);Unsteadiness on feet (R26.81);Difficulty in walking, not elsewhere classified (R26.2)     Time: 7092-9574 PT Time Calculation (min) (ACUTE ONLY): 28 min  Charges:  $Therapeutic Activity: 23-37 mins                    Willow Ora, PTA, [facility] [employer] Office- [phone] 03/14/20,  4:08 PM  Willow Ora 03/14/2020, 4:07 PM

## 2020-03-14 NOTE — Progress Notes (Signed)
PROGRESS NOTE    [name] [name]  NLZ:[id number] DOB: 16-Nov-1965 DOA: 03/07/2020 PCP: Biagio Borg, MD     Brief Narrative:  [name] Fabio Wrightis a 54 y.o.femalewith medical history significant ofhypertension, insulin-dependent type 2 diabetes, diabetic neuropathy, ESRD on hemodialysis MWF, NAFLD,chronic bilateral lower extremity weakness, recurrent ESBL Klebsiella UTI, history of pubic symphysis septic arthritis, osteomyelitis and extraperitoneal left thigh abscess status post I &D of left groin on 11/26/2019 brought by EMS from SNF to the emergency department for evaluation of AMS.  At rehab, was noted to be confused and unresponsive. EMS was called-her initial CBG was noted to be 44 and improved to 190. Blood pressure was noted to be 88/45 and was given a bolus of normal saline, BP increased to 106/68. Patient was brought to ER for further evaluation and management. In the ED, UA positive for pyuria.  Chest x-ray, troponin, CT head negative.  CT abdomen/pelvis concerning for acute cystitis. Patient received cefepime, vancomycin, Flagyl and Nalaxone in the ED. Urine culture growing Enterococcus faecalis and Staph Lugdunensis.  Blood cultures negative to date.  New events last 24 hours / Subjective: Patient seen in HD. Continues to have epigastric abdominal pain and nausea. No improvement but was able to eat a bit yesterday.   Assessment & Plan:   Principal Problem:   Sepsis ([place]) Active Problems:   DM (diabetes mellitus), type 2, uncontrolled ([place])   Diabetic neuropathy ([place])   Essential hypertension   ESRD (end stage renal disease) on dialysis ([place])   Hyperkalemia   Acute cystitis   Acute metabolic encephalopathy   Pressure injury of skin   Sepsis secondary to Enterococcus faecalis, Staph Lugdunensis UTI, POA  -Urine culture growing Enterococcus faecalis, staph lugdunensis  -Minocycline 100 mg bid for 7 days total thorugh 9/13 -Amoxicillin 500 mg TID for 7 days total through  9/02  Acute metabolic encephalopathy likely secondary to hypoglycemia versus sepsis -CT head is negative for acute findings -Resolved   ESRD on hemodialysis MWF -Nephrology following  Insulin-dependent type 2 diabetes mellitus, with neuropathy  -Hemoglobin A1c 5.8 on 03/09/2020  -SSI  -Continue gabapentin  Essential hypertension -Coreg, amlodipine, hydralazine  Chronic bilateral lower extremity weakness -No nerve response in legs on EMG on 10/2018 -Fall precaution  Abdominal pain -KUB unremarkable -Lipase 25  -LFTs improving  -Did not improve by holding Renvela, resumed -Trial of Reglan did not help -? Side effect of antibiotics which can cause nausea, vomiting.  -Has not had BM, ordered bowel regimen and pain medications to help with complaint    In agreement with assessment of the pressure ulcer as below:  Pressure Injury 03/08/20 Stage 1 -  Intact skin with non-blanchable redness of a localized area usually over a bony prominence. (Active)  03/08/20 0400  Location:   Location Orientation:   Staging: Stage 1 -  Intact skin with non-blanchable redness of a localized area usually over a bony prominence.  Wound Description (Comments):   Present on Admission: Yes  POA       DVT prophylaxis:  heparin injection 5,000 Units Start: 03/07/20 1430 SCDs Start: 03/07/20 1422  Code Status: Full Family Communication: None at bedside Disposition Plan:   Status is: Inpatient  Remains inpatient appropriate because:Inpatient level of care appropriate due to severity of illness   Dispo:  Patient From: Home  Planned Disposition: [facility]  Expected discharge date: 03/15/20  Medically stable for discharge: No. Continue to have abdominal pain. Trial IV reglan, bowel regimen, fentanyl. Hopeful  discharge to SNF 9/11.    Consultants:   Nephrology  Infectious disease   Antimicrobials:  Anti-infectives (From admission, onward)   Start     Dose/Rate  Route Frequency Ordered Stop   03/12/20 1530  minocycline (MINOCIN) capsule 100 mg        100 mg Oral 2 times daily 03/12/20 1501 03/19/20 0959   03/11/20 1500  vancomycin (VANCOREADY) IVPB 750 mg/150 mL  Status:  Discontinued        750 mg 150 mL/hr over 60 Minutes Intravenous Every T-Th-Sa (Hemodialysis) 03/08/20 1326 03/08/20 1327   03/11/20 1000  doxycycline (VIBRAMYCIN) 50 MG capsule 50 mg  Status:  Discontinued       Note to Pharmacy: Staph Lugdunensis UTI   50 mg Oral Every 12 hours 03/11/20 0725 03/11/20 0935   03/11/20 1000  doxycycline (VIBRA-TABS) tablet 100 mg  Status:  Discontinued        100 mg Oral Every 12 hours 03/11/20 0935 03/12/20 1501   03/10/20 2100  amoxicillin (AMOXIL) capsule 500 mg        500 mg Oral Daily-1800 03/10/20 1457 03/15/20 2359   03/10/20 1200  vancomycin (VANCOREADY) IVPB 750 mg/150 mL  Status:  Discontinued        750 mg 150 mL/hr over 60 Minutes Intravenous Every M-W-F (Hemodialysis) 03/07/20 1346 03/08/20 1326   03/09/20 0730  Ampicillin-Sulbactam (UNASYN) 3 g in sodium chloride 0.9 % 100 mL IVPB  Status:  Discontinued        3 g 200 mL/hr over 30 Minutes Intravenous Every 12 hours 03/09/20 0627 03/10/20 1457   03/08/20 2000  ceFEPIme (MAXIPIME) 1 g in sodium chloride 0.9 % 100 mL IVPB  Status:  Discontinued        1 g 200 mL/hr over 30 Minutes Intravenous Every 24 hours 03/07/20 1628 03/09/20 0617   03/08/20 1500  vancomycin (VANCOREADY) IVPB 750 mg/150 mL  Status:  Discontinued        750 mg 150 mL/hr over 60 Minutes Intravenous Every T-Th-Sa (Hemodialysis) 03/08/20 1327 03/08/20 1457   03/07/20 1430  cefTRIAXone (ROCEPHIN) 2 g in sodium chloride 0.9 % 100 mL IVPB  Status:  Discontinued        2 g 200 mL/hr over 30 Minutes Intravenous Every 24 hours 03/07/20 1427 03/07/20 1445   03/07/20 1345  ceFEPIme (MAXIPIME) 1 g in sodium chloride 0.9 % 100 mL IVPB  Status:  Discontinued        1 g 200 mL/hr over 30 Minutes Intravenous Every 24 hours  03/07/20 1343 03/07/20 1628   03/07/20 1130  vancomycin (VANCOREADY) IVPB 1500 mg/300 mL        1,500 mg 150 mL/hr over 120 Minutes Intravenous  Once 03/07/20 1127 03/07/20 1720   03/07/20 1115  ceFEPIme (MAXIPIME) 2 g in sodium chloride 0.9 % 100 mL IVPB        2 g 200 mL/hr over 30 Minutes Intravenous  Once 03/07/20 1100 03/07/20 1220   03/07/20 1115  metroNIDAZOLE (FLAGYL) IVPB 500 mg        500 mg 100 mL/hr over 60 Minutes Intravenous  Once 03/07/20 1100 03/07/20 1630   03/07/20 1115  vancomycin (VANCOCIN) IVPB 1000 mg/200 mL premix  Status:  Discontinued        1,000 mg 200 mL/hr over 60 Minutes Intravenous  Once 03/07/20 1100 03/07/20 1127       Objective: Vitals:   03/14/20 0830 03/14/20 0900 03/14/20 0930 03/14/20 1000  BP: Marland Kitchen)  143/82 (!) 93/58 123/83 (!) 161/66  Pulse: (!) 55 (!) 58 (!) 56 (!) 55  Resp:      Temp:      TempSrc:      SpO2:      Weight:      Height:        Intake/Output Summary (Last 24 hours) at 03/14/2020 1016 Last data filed at 03/14/2020 0640 Gross per 24 hour  Intake 360 ml  Output --  Net 360 ml   Filed Weights   03/13/20 0437 03/14/20 0500 03/14/20 0752  Weight: 67.4 kg 72.5 kg 68.7 kg    Examination: General exam: Appears calm and comfortable  Respiratory system: Clear to auscultation. Respiratory effort normal. Cardiovascular system: S1 & S2 heard, RRR. No pedal edema. Gastrointestinal system: Abdomen is nondistended, soft and mildly TTP epigastric.  Central nervous system: Alert and oriented. Non focal exam. Speech clear  Extremities: Symmetric in appearance bilaterally  Skin: No rashes, lesions or ulcers on exposed skin  Psychiatry: Judgement and insight appear stable. Mood & affect appropriate.    Data Reviewed: I have personally reviewed following labs and imaging studies  CBC: Recent Labs  Lab 03/07/20 1128 03/07/20 1145 03/09/20 0824 03/10/20 1324 03/12/20 0557 03/13/20 0913 03/14/20 0628  WBC 11.6*   < > 10.4 7.9  6.1 5.6 6.3  NEUTROABS 7.9*  --   --  3.8 2.1  --  2.2  HGB 13.2   < > 13.3 13.3 13.0 13.4 12.5  HCT 44.5   < > 44.6 42.8 43.2 43.9 42.3  MCV 85.9   < > 85.9 85.6 85.0 85.9 86.5  PLT 184   < > 174 159 162 157 156   < > = values in this interval not displayed.   Basic Metabolic Panel: Recent Labs  Lab 03/07/20 1422 03/08/20 1327 03/09/20 0824 03/10/20 1324 03/12/20 0557 03/13/20 0913 03/14/20 0628  NA  --    < > 138 140 136 140 140  K  --    < > 4.1 5.3* 4.6 4.2 4.5  CL  --    < > 100 102 98 101 100  CO2  --    < > 20* 22 25 26 26   GLUCOSE  --    < > 123* 98 99 108* 157*  BUN  --    < > 27* 48* 34* 25* 44*  CREATININE 6.57*   < > 4.11* 5.63* 4.28* 3.81* 5.15*  CALCIUM  --    < > 8.4* 8.5* 8.8* 9.1 9.0  MG 3.1*  --   --   --   --   --   --   PHOS 8.2*  --   --  6.9*  --   --   --    < > = values in this interval not displayed.   GFR: Estimated Creatinine Clearance: 10.9 mL/min (A) (by C-G formula based on SCr of 5.15 mg/dL (H)). Liver Function Tests: Recent Labs  Lab 03/08/20 1327 03/10/20 1324 03/12/20 0557 03/13/20 0913 03/14/20 0628  AST 69* 86* 43* 39 37  ALT 97* 104* 71* 59* 53*  ALKPHOS 113 116 101 104 97  BILITOT 0.6 0.7 0.7 0.8 0.5  PROT 6.5 6.5 6.4* 6.8 6.5  ALBUMIN 2.9* 2.8* 2.6* 2.9* 3.0*   Recent Labs  Lab 03/07/20 1128 03/12/20 0557  LIPASE 61* 25   Recent Labs  Lab 03/07/20 1128  AMMONIA 18   Coagulation Profile: Recent Labs  Lab 03/07/20 1128  03/08/20 1924  INR 1.1 1.1   Cardiac Enzymes: No results for input(s): CKTOTAL, CKMB, CKMBINDEX, TROPONINI in the last 168 hours. BNP (last 3 results) No results for input(s): PROBNP in the last 8760 hours. HbA1C: No results for input(s): HGBA1C in the last 72 hours. CBG: Recent Labs  Lab 03/13/20 0809 03/13/20 1209 03/13/20 1729 03/13/20 2106 03/14/20 0633  GLUCAP 106* 128* 139* 137* 137*   Lipid Profile: No results for input(s): CHOL, HDL, LDLCALC, TRIG, CHOLHDL, LDLDIRECT in the  last 72 hours. Thyroid Function Tests: No results for input(s): TSH, T4TOTAL, FREET4, T3FREE, THYROIDAB in the last 72 hours. Anemia Panel: No results for input(s): VITAMINB12, FOLATE, FERRITIN, TIBC, IRON, RETICCTPCT in the last 72 hours. Sepsis Labs: Recent Labs  Lab 03/07/20 1128 03/07/20 1258 03/08/20 1327  PROCALCITON  --   --  6.82  LATICACIDVEN 0.9 0.8  --     Recent Results (from the past 240 hour(s))  Blood Culture (routine x 2)     Status: None   Collection Time: 03/07/20 11:03 AM   Specimen: BLOOD  Result Value Ref Range Status   Specimen Description BLOOD RIGHT THUMB  Final   Special Requests   Final    BOTTLES DRAWN AEROBIC AND ANAEROBIC Blood Culture adequate volume   Culture   Final    NO GROWTH 5 DAYS Performed at [facility], [address] [place], [place] [address]    Report Status 03/12/2020 FINAL  Final  Urine culture     Status: Abnormal   Collection Time: 03/07/20 11:23 AM   Specimen: In/Out Cath Urine  Result Value Ref Range Status   Specimen Description IN/OUT CATH URINE  Final   Special Requests   Final    NONE Performed at [facility], [place] [address] [place], [place] [address]    Culture (A)  Final    >=100,000 COLONIES/mL ENTEROCOCCUS FAECALIS >=100,000 COLONIES/mL STAPHYLOCOCCUS LUGDUNENSIS VANCOMYCIN RESISTANT ENTEROCOCCUS ISOLATED    Report Status 03/10/2020 FINAL  Final   Organism ID, Bacteria STAPHYLOCOCCUS LUGDUNENSIS (A)  Final   Organism ID, Bacteria ENTEROCOCCUS FAECALIS (A)  Final      Susceptibility   Enterococcus faecalis - MIC*    AMPICILLIN <=2 SENSITIVE Sensitive     NITROFURANTOIN <=16 SENSITIVE Sensitive     VANCOMYCIN >=32 RESISTANT Resistant     LINEZOLID 2 SENSITIVE Sensitive     * >=100,000 COLONIES/mL ENTEROCOCCUS FAECALIS   Staphylococcus lugdunensis - MIC*    CIPROFLOXACIN <=0.5 SENSITIVE Sensitive     GENTAMICIN <=0.5 SENSITIVE Sensitive     NITROFURANTOIN <=16 SENSITIVE Sensitive      OXACILLIN >=4 RESISTANT Resistant     TETRACYCLINE <=1 SENSITIVE Sensitive     VANCOMYCIN <=0.5 SENSITIVE Sensitive     TRIMETH/SULFA <=10 SENSITIVE Sensitive     CLINDAMYCIN RESISTANT Resistant     RIFAMPIN <=0.5 SENSITIVE Sensitive     Inducible Clindamycin POSITIVE Resistant     * >=100,000 COLONIES/mL STAPHYLOCOCCUS LUGDUNENSIS  Blood Culture (routine x 2)     Status: None   Collection Time: 03/07/20 11:28 AM   Specimen: BLOOD  Result Value Ref Range Status   Specimen Description BLOOD REJ  Final   Special Requests   Final    BOTTLES DRAWN AEROBIC AND ANAEROBIC Blood Culture adequate volume   Culture   Final    NO GROWTH 5 DAYS Performed at [facility], [address] [place], [place] [address]    Report Status 03/12/2020  FINAL  Final  SARS Coronavirus 2 by RT PCR (hospital order, performed in [facility] hospital lab) Nasopharyngeal Nasopharyngeal Swab     Status: None   Collection Time: 03/07/20 11:28 AM   Specimen: Nasopharyngeal Swab  Result Value Ref Range Status   SARS Coronavirus 2 NEGATIVE NEGATIVE Final    Comment: (NOTE) SARS-CoV-2 target nucleic acids are NOT DETECTED.  The SARS-CoV-2 RNA is generally detectable in upper and lower respiratory specimens during the acute phase of infection. The lowest concentration of SARS-CoV-2 viral copies this assay can detect is 250 copies / mL. A negative result does not preclude SARS-CoV-2 infection and should not be used as the sole basis for treatment or other patient management decisions.  A negative result may occur with improper specimen collection / handling, submission of specimen other than nasopharyngeal swab, presence of viral mutation(s) within the areas targeted by this assay, and inadequate number of viral copies (<250 copies / mL). A negative result must be combined with clinical observations, patient history, and epidemiological information.  Fact Sheet for Patients:    StrictlyIdeas.no  Fact Sheet for Healthcare Providers: BankingDealers.co.za  This test is not yet approved or  cleared by the [place] FDA and has been authorized for detection and/or diagnosis of SARS-CoV-2 by FDA under an Emergency Use Authorization (EUA).  This EUA will remain in effect (meaning this test can be used) for the duration of the COVID-19 declaration under Section 564(b)(1) of the Act, 21 U.S.C. section 360bbb-3(b)(1), unless the authorization is terminated or revoked sooner.  Performed at [facility], [place] [address], [place], [place] [address]   MRSA PCR Screening     Status: None   Collection Time: 03/08/20  6:14 AM   Specimen: Nasopharyngeal  Result Value Ref Range Status   MRSA by PCR NEGATIVE NEGATIVE Final    Comment:        The GeneXpert MRSA Assay (FDA approved for NASAL specimens only), is one component of a comprehensive MRSA colonization surveillance program. It is not intended to diagnose MRSA infection nor to guide or monitor treatment for MRSA infections. Performed at [facility], [place] [address], [place], [place] [address]   SARS Coronavirus 2 by RT PCR (hospital order, performed in [facility] hospital lab) Nasopharyngeal Nasopharyngeal Swab     Status: None   Collection Time: 03/13/20 11:22 AM   Specimen: Nasopharyngeal Swab  Result Value Ref Range Status   SARS Coronavirus 2 NEGATIVE NEGATIVE Final    Comment: (NOTE) SARS-CoV-2 target nucleic acids are NOT DETECTED.  The SARS-CoV-2 RNA is generally detectable in upper and lower respiratory specimens during the acute phase of infection. The lowest concentration of SARS-CoV-2 viral copies this assay can detect is 250 copies / mL. A negative result does not preclude SARS-CoV-2 infection and should not be used as the sole basis for treatment or other patient management decisions.  A negative result may occur with improper  specimen collection / handling, submission of specimen other than nasopharyngeal swab, presence of viral mutation(s) within the areas targeted by this assay, and inadequate number of viral copies (<250 copies / mL). A negative result must be combined with clinical observations, patient history, and epidemiological information.  Fact Sheet for Patients:   StrictlyIdeas.no  Fact Sheet for Healthcare Providers: BankingDealers.co.za  This test is not yet approved or  cleared by the [place] FDA and has been authorized for detection and/or diagnosis of SARS-CoV-2 by FDA under an Emergency Use  Authorization (EUA).  This EUA will remain in effect (meaning this test can be used) for the duration of the COVID-19 declaration under Section 564(b)(1) of the Act, 21 U.S.C. section 360bbb-3(b)(1), unless the authorization is terminated or revoked sooner.  Performed at [facility], [place] [address], [place], [place] [address]       Radiology Studies: DG Abd 1 View  Result Date: 03/12/2020 CLINICAL DATA:  Abdominal pain and nausea. EXAM: ABDOMEN - 1 VIEW COMPARISON:  CT abdomen pelvis dated March 07, 2020. FINDINGS: The bowel gas pattern is normal. No radio-opaque calculi or other significant radiographic abnormality are seen. Prior cholecystectomy. No acute osseous abnormality. IMPRESSION: 1. Negative. Electronically Signed   By: Titus Dubin M.D.   On: 03/12/2020 10:46      Scheduled Meds: . amLODipine  10 mg Oral Daily  . amoxicillin  500 mg Oral q1800  . carvedilol  25 mg Oral BID  . Chlorhexidine Gluconate Cloth  6 each Topical Q0600  . Chlorhexidine Gluconate Cloth  6 each Topical Q0600  . docusate sodium  100 mg Oral BID  . gabapentin  200 mg Oral QHS  . heparin  5,000 Units Subcutaneous Q8H  . hydrALAZINE  10 mg Oral Q8H  . insulin aspart  0-5 Units Subcutaneous QHS  . insulin aspart  0-6 Units Subcutaneous TID WC  .  metoCLOPramide (REGLAN) injection  5 mg Intravenous Q8H  . minocycline  100 mg Oral BID  . polyethylene glycol  17 g Oral Daily  . sevelamer carbonate  800 mg Oral TID WC   Continuous Infusions:   LOS: 7 days      Time spent: 30 minutes   Dessa Phi, DO [facility] 03/14/2020, 10:16 AM   Available via Epic secure chat 7am-7pm After these hours, please refer to coverage provider listed on [email or web address]

## 2020-03-14 NOTE — Progress Notes (Signed)
Pt requesting IV placement in the morning,informed of IV consult but refusing IV accesss at this time. RN notified.

## 2020-03-15 LAB — GLUCOSE, CAPILLARY: Glucose-Capillary: 129 mg/dL — ABNORMAL HIGH (ref 70–99)

## 2020-03-15 MED ORDER — AMOXICILLIN 500 MG PO CAPS: 500.0000 mg | ORAL_CAPSULE | Freq: Three times a day (TID) | ORAL | 0 refills | Status: AC

## 2020-03-15 MED ORDER — OXYCODONE HCL 5 MG PO TABS: 5.0000 mg | ORAL_TABLET | Freq: Four times a day (QID) | ORAL | 0 refills | Status: DC | PRN

## 2020-03-15 MED ORDER — MINOCYCLINE HCL 100 MG PO CAPS: 100.0000 mg | ORAL_CAPSULE | Freq: Two times a day (BID) | ORAL | 0 refills | Status: AC

## 2020-03-15 MED ORDER — INSULIN GLARGINE 300 UNIT/ML ~~LOC~~ SOPN: 13.0000 [IU] | PEN_INJECTOR | Freq: Every day | SUBCUTANEOUS | Status: DC

## 2020-03-15 NOTE — Discharge Summary (Signed)
Physician Discharge Summary  [name] [name] KXF:[id number] DOB: 12/17/1965 DOA: 03/07/2020  PCP: Biagio Borg, MD  Admit date: 03/07/2020 Discharge date: 03/15/2020  Admitted From: Home Disposition:  SNF   Recommendations for Outpatient Follow-up:  1. Follow up with PCP in 1 week 2. HD MWF  Discharge Condition: Stable CODE STATUS: Full  Diet recommendation:  Diet Orders (From admission, onward)    Start     Ordered   03/15/20 0000  Diet - low sodium heart healthy        03/15/20 0917   03/08/20 0447  Diet Carb Modified Fluid consistency: Thin; Room service appropriate? Yes  Diet effective now       Question Answer Comment  Diet-HS Snack? Nothing   Calorie Level Medium 1600-2000   Fluid consistency: Thin   Room service appropriate? Yes      03/08/20 0446         Brief/Interim Summary: [name] [name] a 54 y.o.femalewith medical history significant ofhypertension, insulin-dependent type 2 diabetes, diabetic neuropathy, ESRD on hemodialysis MWF, NAFLD,chronic bilateral lower extremity weakness, recurrent ESBL Klebsiella UTI, history of pubic symphysis septic arthritis,osteomyelitis and extraperitoneal left thigh abscess status post I &D of left groin on 11/26/2019 brought by EMS from SNF to the emergency department for evaluation of AMS.At rehab,was noted to be confused and unresponsive. EMS was called-her initial CBG was noted to be 44 and improved to 190. Blood pressure was noted to be 88/45 and was given a bolus of normal saline,BP increased to 106/68. Patient was brought to ER for further evaluation and management. In the ED, UApositive for pyuria. Chest x-ray, troponin,CT head negative. CT abdomen/pelvis concerning for acute cystitis. Patient received cefepime, vancomycin, Flagyl and Nalaxone in the ED. Urine culture growing Enterococcus faecalis andStaph Lugdunensis.Blood cultures negative to date.  Discharge Diagnoses:  Principal Problem:   Sepsis  ([place]) Active Problems:   DM (diabetes mellitus), type 2, uncontrolled ([place])   Diabetic neuropathy ([place])   Essential hypertension   ESRD (end stage renal disease) on dialysis ([place])   Hyperkalemia   Acute cystitis   Acute metabolic encephalopathy   Pressure injury of skin   Sepsis secondary to Enterococcus faecalis, StaphLugdunensis UTI, POA  -Urine culture growing Enterococcus faecalis, staph lugdunensis  -Minocycline 100 mg bid for 7 days total thorugh 9/13 -Amoxicillin 500 mg TID for 7 days total through 6/96  Acute metabolic encephalopathy likely secondary to hypoglycemia versus sepsis -CT head is negative for acute findings -Resolved   ESRD on hemodialysis MWF -Nephrology following  Insulin-dependent type 2 diabetes mellitus, with neuropathy  -Hemoglobin A1c 5.8 on 03/09/2020  -SSI  -Continue gabapentin  Essential hypertension -Coreg, amlodipine, hydralazine  Chronic bilateral lower extremity weakness -No nerve response in legs on EMG on 10/2018 -Fall precaution  Abdominal pain -KUB unremarkable -Lipase 25  -LFTs improving  -Did not improve by holding Renvela, resumed -Trial of Reglan did not help -? Side effect of antibiotics which can cause nausea, vomiting.  -Has not had BM, ordered bowel regimen and pain medications to help with complaint  -No TTP on exam today. Has been tolerating diet without vomiting.     In agreement with assessment of the pressure ulcer as below:  Pressure Injury 03/08/20 Stage 1 -  Intact skin with non-blanchable redness of a localized area usually over a bony prominence. (Active)  03/08/20 0400  Location:   Location Orientation:   Staging: Stage 1 -  Intact skin with non-blanchable redness of a localized area usually  over a bony prominence.  Wound Description (Comments):   Present on Admission: Yes       Discharge Instructions  Discharge Instructions    Diet - low sodium heart healthy   Complete by: As directed     Increase activity slowly   Complete by: As directed    No wound care   Complete by: As directed      Allergies as of 03/15/2020      Reactions   Amitriptyline Swelling   Other reaction(s): Confusion (intolerance)   Ciprofloxacin Diarrhea, Nausea And Vomiting   VOMITING Severe vomiting requiring ED visit, IV reglan and IV fluids VOMITING   Semaglutide Nausea Only   Tramadol Itching   Tylenol [acetaminophen] Itching      Medication List    STOP taking these medications   amitriptyline 25 MG tablet Commonly known as: ELAVIL   atorvastatin 20 MG tablet Commonly known as: LIPITOR   furosemide 20 MG tablet Commonly known as: Lasix   HumuLIN R U-500 KwikPen 500 UNIT/ML kwikpen Generic drug: insulin regular human CONCENTRATED   losartan 25 MG tablet Commonly known as: COZAAR   naproxen 500 MG tablet Commonly known as: NAPROSYN   potassium chloride 10 MEQ tablet Commonly known as: KLOR-CON   pregabalin 150 MG capsule Commonly known as: Lyrica   traMADol 50 MG tablet Commonly known as: ULTRAM   VITAMIN B-12 PO     TAKE these medications   amLODipine 10 MG tablet Commonly known as: NORVASC Take 10 mg by mouth daily. What changed: Another medication with the same name was removed. Continue taking this medication, and follow the directions you see here.   amoxicillin 500 MG capsule Commonly known as: AMOXIL Take 1 capsule (500 mg total) by mouth 3 (three) times daily for 1 day. Through 9/11   atropine 1 % ophthalmic solution Place 1 drop into the right eye 3 (three) times daily.   B-complex with vitamin C tablet Take 1 tablet by mouth daily.   bisacodyl 10 MG suppository Commonly known as: DULCOLAX Place 10 mg rectally daily as needed for moderate constipation.   bisacodyl 5 MG EC tablet Commonly known as: DULCOLAX Take 10 mg by mouth See admin instructions. 2 tabs (10mg ) every three days   carvedilol 25 MG tablet Commonly known as: COREG Take 25 mg by  mouth 2 (two) times daily.   dicyclomine 20 MG tablet Commonly known as: BENTYL Take 1 tablet (20 mg total) by mouth 2 (two) times daily.   ferrous sulfate 325 (65 FE) MG tablet Take 325 mg by mouth 2 (two) times daily with a meal.   gabapentin 100 MG capsule Commonly known as: NEURONTIN Take 200 mg by mouth at bedtime.   gabapentin 100 MG capsule Commonly known as: NEURONTIN Take 100 mg by mouth daily.   hydrALAZINE 10 MG tablet Commonly known as: APRESOLINE Take 10 mg by mouth every 4 (four) hours as needed (BP if > 165/100).   Insulin Glargine 300 UNIT/ML Sopn Inject 13 Units into the skin daily.   insulin lispro 100 UNIT/ML injection Commonly known as: HUMALOG Inject 2-14 Units into the skin daily. Per sliding scale: BG  201-250= 2 units, 251-300 = 4 units, 301-350= 6 units, 351-400= 8 units, 401-450= 10 units, 451-500 = 12 units, 500 or greater = 14 units.   isosorbide dinitrate 20 MG tablet Commonly known as: ISORDIL Take 20 mg by mouth 3 (three) times daily.   LACTOBACILLUS EXTRA STRENGTH PO Take 1 capsule  by mouth daily.   Magnesium 400 MG Tabs Take 400 mg by mouth 2 (two) times daily.   minocycline 100 MG capsule Commonly known as: MINOCIN Take 1 capsule (100 mg total) by mouth 2 (two) times daily for 2 days.   omeprazole 20 MG capsule Commonly known as: PRILOSEC Take 1 capsule (20 mg total) by mouth daily.   ondansetron 8 MG tablet Commonly known as: Zofran Take 1 tablet (8 mg total) by mouth every 6 (six) hours as needed for nausea or vomiting.   oxyCODONE 5 MG immediate release tablet Commonly known as: Roxicodone Take 1 tablet (5 mg total) by mouth every 6 (six) hours as needed for severe pain.   polyethylene glycol 17 g packet Commonly known as: MIRALAX / GLYCOLAX Take 17 g by mouth daily.   prednisoLONE acetate 1 % ophthalmic suspension Commonly known as: PRED FORTE Place 1 drop into the right eye 4 (four) times daily.   sennosides-docusate  sodium 8.6-50 MG tablet Commonly known as: SENOKOT-S Take 2 tablets by mouth 2 (two) times daily.   vitamin C 100 MG tablet Take 100 mg by mouth daily.       Contact information for follow-up providers    Biagio Borg, MD. Schedule an appointment as soon as possible for a visit in 1 week(s).   Specialties: Internal Medicine, Radiology Contact information: [place] [address] [phone]            Contact information for after-discharge care    Destination    [facility] .   Service: Skilled Nursing Contact information: [address] [address] [phone]                 Allergies  Allergen Reactions  . Amitriptyline Swelling    Other reaction(s): Confusion (intolerance)  . Ciprofloxacin Diarrhea and Nausea And Vomiting    VOMITING Severe vomiting requiring ED visit, IV reglan and IV fluids VOMITING   . Semaglutide Nausea Only  . Tramadol Itching  . Tylenol [Acetaminophen] Itching    Consultations:  Nephrology  ID    Procedures/Studies: CT ABDOMEN PELVIS WO CONTRAST  Result Date: 03/07/2020 CLINICAL DATA:  Altered mental status, leukocytosis, elevated serum lipase EXAM: CT ABDOMEN AND PELVIS WITHOUT CONTRAST TECHNIQUE: Multidetector CT imaging of the abdomen and pelvis was performed following the standard protocol without IV contrast. COMPARISON:  CT 10/22/2019 FINDINGS: Lower chest: Mild bibasilar atelectasis. Heart size is mildly enlarged. No pericardial effusion. Hepatobiliary: No focal abnormality identified within the unenhanced liver. Status post cholecystectomy. No biliary dilatation. Pancreas: Grossly unremarkable without evidence of peripancreatic inflammatory changes or fluid. No ductal dilatation identified. Spleen: Normal in size without focal abnormality. Adrenals/Urinary Tract: Unremarkable adrenal glands. Kidneys appear within normal limits. No hydronephrosis.  Urinary bladder is diffusely thick-walled with associated fat stranding. Foley catheter is in place. Stomach/Bowel: Stomach is within normal limits. Small bowel is fluid-filled without dilation or evidence of obstruction. Mild-moderate volume of stool throughout the colon. No evidence of bowel wall thickening, distention, or inflammatory changes. Vascular/Lymphatic: Scattered aortoiliac atherosclerotic calcifications without aneurysm. Duplicated IVC. No new or enlarging abdominopelvic lymph nodes. Reproductive: Status post hysterectomy. No adnexal masses. Other: Previously seen suprapubic fluid collection has resolved. No ascites. No new abdominopelvic fluid collection. No pneumoperitoneum. Small fat containing periumbilical hernia. Mild diffuse anasarca. Musculoskeletal: No acute or significant osseous findings. IMPRESSION: 1. Urinary bladder is diffusely thick-walled with associated fat stranding, suggestive of cystitis. Correlation with urinalysis  is recommended. 2. Previously seen suprapubic fluid collection has resolved. No new abdominopelvic fluid collection or abscess. 3. No CT evidence of acute pancreatitis. 4. Mild-moderate volume of stool throughout the colon. No evidence of bowel obstruction. 5. Aortic atherosclerosis. (ICD10-I70.0). Electronically Signed   By: Davina Poke D.O.   On: 03/07/2020 12:52   DG Abd 1 View  Result Date: 03/12/2020 CLINICAL DATA:  Abdominal pain and nausea. EXAM: ABDOMEN - 1 VIEW COMPARISON:  CT abdomen pelvis dated March 07, 2020. FINDINGS: The bowel gas pattern is normal. No radio-opaque calculi or other significant radiographic abnormality are seen. Prior cholecystectomy. No acute osseous abnormality. IMPRESSION: 1. Negative. Electronically Signed   By: Titus Dubin M.D.   On: 03/12/2020 10:46   CT Head Wo Contrast  Result Date: 03/07/2020 CLINICAL DATA:  Head trauma, altered mental status. EXAM: CT HEAD WITHOUT CONTRAST TECHNIQUE: Contiguous axial images were  obtained from the base of the skull through the vertex without intravenous contrast. COMPARISON:  07/14/2019 FINDINGS: Brain: No evidence of acute infarction, hemorrhage, hydrocephalus, extra-axial collection or mass lesion/mass effect. Vascular: Calcific intracranial atherosclerosis. Skull: Normal. Negative for fracture or focal lesion. Sinuses/Orbits: No acute finding. Partially imaged right maxillary osteoma. Other: No mastoid effusion. IMPRESSION: No acute intracranial abnormality. Electronically Signed   By: Margaretha Sheffield MD   On: 03/07/2020 12:43   DG Chest Port 1 View  Result Date: 03/07/2020 CLINICAL DATA:  Sepsis EXAM: PORTABLE CHEST 1 VIEW COMPARISON:  10/18/2019 FINDINGS: Mild cardiomegaly. Central line tip at the proximal right atrium. Lungs are clear. Vascularity is normal. No effusions. No acute bone finding. IMPRESSION: No active disease. Mild cardiomegaly. Central line tip at the proximal right atrium. Electronically Signed   By: Nelson Chimes M.D.   On: 03/07/2020 11:30       Discharge Exam: Vitals:   03/14/20 2100 03/15/20 0614  BP: (!) 174/75 (!) 184/80  Pulse: 65 61  Resp: 16 13  Temp: 99.1 F (37.3 C) 98.2 F (36.8 C)  SpO2: 98% 96%    General: Pt is alert, awake, not in acute distress Cardiovascular: RRR, S1/S2 +, no edema Respiratory: CTA bilaterally, no wheezing, no rhonchi, no respiratory distress, no conversational dyspnea  Abdominal: Soft, NT, ND, bowel sounds + Extremities: no edema, no cyanosis Psych: Flat affect   The results of significant diagnostics from this hospitalization (including imaging, microbiology, ancillary and laboratory) are listed below for reference.     Microbiology: Recent Results (from the past 240 hour(s))  Blood Culture (routine x 2)     Status: None   Collection Time: 03/07/20 11:03 AM   Specimen: BLOOD  Result Value Ref Range Status   Specimen Description BLOOD RIGHT THUMB  Final   Special Requests   Final    BOTTLES  DRAWN AEROBIC AND ANAEROBIC Blood Culture adequate volume   Culture   Final    NO GROWTH 5 DAYS Performed at [facility], [address] [place], [place] [address]    Report Status 03/12/2020 FINAL  Final  Urine culture     Status: Abnormal   Collection Time: 03/07/20 11:23 AM   Specimen: In/Out Cath Urine  Result Value Ref Range Status   Specimen Description IN/OUT CATH URINE  Final   Special Requests   Final    NONE Performed at [facility], [place] [address] [place], [place] [address]    Culture (A)  Final    >=100,000 COLONIES/mL ENTEROCOCCUS FAECALIS >=100,000 COLONIES/mL STAPHYLOCOCCUS LUGDUNENSIS VANCOMYCIN  RESISTANT ENTEROCOCCUS ISOLATED    Report Status 03/10/2020 FINAL  Final   Organism ID, Bacteria STAPHYLOCOCCUS LUGDUNENSIS (A)  Final   Organism ID, Bacteria ENTEROCOCCUS FAECALIS (A)  Final      Susceptibility   Enterococcus faecalis - MIC*    AMPICILLIN <=2 SENSITIVE Sensitive     NITROFURANTOIN <=16 SENSITIVE Sensitive     VANCOMYCIN >=32 RESISTANT Resistant     LINEZOLID 2 SENSITIVE Sensitive     * >=100,000 COLONIES/mL ENTEROCOCCUS FAECALIS   Staphylococcus lugdunensis - MIC*    CIPROFLOXACIN <=0.5 SENSITIVE Sensitive     GENTAMICIN <=0.5 SENSITIVE Sensitive     NITROFURANTOIN <=16 SENSITIVE Sensitive     OXACILLIN >=4 RESISTANT Resistant     TETRACYCLINE <=1 SENSITIVE Sensitive     VANCOMYCIN <=0.5 SENSITIVE Sensitive     TRIMETH/SULFA <=10 SENSITIVE Sensitive     CLINDAMYCIN RESISTANT Resistant     RIFAMPIN <=0.5 SENSITIVE Sensitive     Inducible Clindamycin POSITIVE Resistant     * >=100,000 COLONIES/mL STAPHYLOCOCCUS LUGDUNENSIS  Blood Culture (routine x 2)     Status: None   Collection Time: 03/07/20 11:28 AM   Specimen: BLOOD  Result Value Ref Range Status   Specimen Description BLOOD REJ  Final   Special Requests   Final    BOTTLES DRAWN AEROBIC AND ANAEROBIC Blood Culture adequate volume   Culture   Final    NO GROWTH 5  DAYS Performed at [facility], [address], [place], [place] [address]    Report Status 03/12/2020 FINAL  Final  SARS Coronavirus 2 by RT PCR (hospital order, performed in [facility] hospital lab) Nasopharyngeal Nasopharyngeal Swab     Status: None   Collection Time: 03/07/20 11:28 AM   Specimen: Nasopharyngeal Swab  Result Value Ref Range Status   SARS Coronavirus 2 NEGATIVE NEGATIVE Final    Comment: (NOTE) SARS-CoV-2 target nucleic acids are NOT DETECTED.  The SARS-CoV-2 RNA is generally detectable in upper and lower respiratory specimens during the acute phase of infection. The lowest concentration of SARS-CoV-2 viral copies this assay can detect is 250 copies / mL. A negative result does not preclude SARS-CoV-2 infection and should not be used as the sole basis for treatment or other patient management decisions.  A negative result may occur with improper specimen collection / handling, submission of specimen other than nasopharyngeal swab, presence of viral mutation(s) within the areas targeted by this assay, and inadequate number of viral copies (<250 copies / mL). A negative result must be combined with clinical observations, patient history, and epidemiological information.  Fact Sheet for Patients:   StrictlyIdeas.no  Fact Sheet for Healthcare Providers: BankingDealers.co.za  This test is not yet approved or  cleared by the [place] FDA and has been authorized for detection and/or diagnosis of SARS-CoV-2 by FDA under an Emergency Use Authorization (EUA).  This EUA will remain in effect (meaning this test can be used) for the duration of the COVID-19 declaration under Section 564(b)(1) of the Act, 21 U.S.C. section 360bbb-3(b)(1), unless the authorization is terminated or revoked sooner.  Performed at [facility], [place] [address] [place], [place] [address]   MRSA PCR Screening     Status: None    Collection Time: 03/08/20  6:14 AM   Specimen: Nasopharyngeal  Result Value Ref Range Status   MRSA by PCR NEGATIVE NEGATIVE Final    Comment:        The GeneXpert MRSA Assay (FDA approved for  NASAL specimens only), is one component of a comprehensive MRSA colonization surveillance program. It is not intended to diagnose MRSA infection nor to guide or monitor treatment for MRSA infections. Performed at [facility], [place] [address], [place], [place] [address]   SARS Coronavirus 2 by RT PCR (hospital order, performed in [facility] hospital lab) Nasopharyngeal Nasopharyngeal Swab     Status: None   Collection Time: 03/13/20 11:22 AM   Specimen: Nasopharyngeal Swab  Result Value Ref Range Status   SARS Coronavirus 2 NEGATIVE NEGATIVE Final    Comment: (NOTE) SARS-CoV-2 target nucleic acids are NOT DETECTED.  The SARS-CoV-2 RNA is generally detectable in upper and lower respiratory specimens during the acute phase of infection. The lowest concentration of SARS-CoV-2 viral copies this assay can detect is 250 copies / mL. A negative result does not preclude SARS-CoV-2 infection and should not be used as the sole basis for treatment or other patient management decisions.  A negative result may occur with improper specimen collection / handling, submission of specimen other than nasopharyngeal swab, presence of viral mutation(s) within the areas targeted by this assay, and inadequate number of viral copies (<250 copies / mL). A negative result must be combined with clinical observations, patient history, and epidemiological information.  Fact Sheet for Patients:   StrictlyIdeas.no  Fact Sheet for Healthcare Providers: BankingDealers.co.za  This test is not yet approved or  cleared by the [place] FDA and has been authorized for detection and/or diagnosis of SARS-CoV-2 by FDA under an Emergency Use Authorization (EUA).  This  EUA will remain in effect (meaning this test can be used) for the duration of the COVID-19 declaration under Section 564(b)(1) of the Act, 21 U.S.C. section 360bbb-3(b)(1), unless the authorization is terminated or revoked sooner.  Performed at [facility], [place] [address], [place],  [address]      Labs: BNP (last 3 results) No results for input(s): BNP in the last 8760 hours. Basic Metabolic Panel: Recent Labs  Lab 03/09/20 0824 03/10/20 1324 03/12/20 0557 03/13/20 0913 03/14/20 0628  NA 138 140 136 140 140  K 4.1 5.3* 4.6 4.2 4.5  CL 100 102 98 101 100  CO2 20* 22 25 26 26   GLUCOSE 123* 98 99 108* 157*  BUN 27* 48* 34* 25* 44*  CREATININE 4.11* 5.63* 4.28* 3.81* 5.15*  CALCIUM 8.4* 8.5* 8.8* 9.1 9.0  PHOS  --  6.9*  --   --   --    Liver Function Tests: Recent Labs  Lab 03/08/20 1327 03/10/20 1324 03/12/20 0557 03/13/20 0913 03/14/20 0628  AST 69* 86* 43* 39 37  ALT 97* 104* 71* 59* 53*  ALKPHOS 113 116 101 104 97  BILITOT 0.6 0.7 0.7 0.8 0.5  PROT 6.5 6.5 6.4* 6.8 6.5  ALBUMIN 2.9* 2.8* 2.6* 2.9* 3.0*   Recent Labs  Lab 03/12/20 0557  LIPASE 25   No results for input(s): AMMONIA in the last 168 hours. CBC: Recent Labs  Lab 03/09/20 0824 03/10/20 1324 03/12/20 0557 03/13/20 0913 03/14/20 0628  WBC 10.4 7.9 6.1 5.6 6.3  NEUTROABS  --  3.8 2.1  --  2.2  HGB 13.3 13.3 13.0 13.4 12.5  HCT 44.6 42.8 43.2 43.9 42.3  MCV 85.9 85.6 85.0 85.9 86.5  PLT 174 159 162 157 156   Cardiac Enzymes: No results for input(s): CKTOTAL, CKMB, CKMBINDEX, TROPONINI in the last 168 hours. BNP: Invalid input(s): POCBNP CBG: Recent Labs  Lab 03/14/20  4627 03/14/20 1159 03/14/20 1702 03/14/20 2128 03/15/20 0815  GLUCAP 137* 119* 149* 151* 129*   D-Dimer No results for input(s): DDIMER in the last 72 hours. Hgb A1c No results for input(s): HGBA1C in the last 72 hours. Lipid Profile No results for input(s): CHOL, HDL, LDLCALC, TRIG, CHOLHDL,  LDLDIRECT in the last 72 hours. Thyroid function studies No results for input(s): TSH, T4TOTAL, T3FREE, THYROIDAB in the last 72 hours.  Invalid input(s): FREET3 Anemia work up No results for input(s): VITAMINB12, FOLATE, FERRITIN, TIBC, IRON, RETICCTPCT in the last 72 hours. Urinalysis    Component Value Date/Time   COLORURINE STRAW (A) 03/07/2020 1123   APPEARANCEUR TURBID (A) 03/07/2020 1123   LABSPEC 1.019 03/07/2020 1123   PHURINE 7.0 03/07/2020 1123   GLUCOSEU NEGATIVE 03/07/2020 1123   GLUCOSEU >=1000 (A) 11/19/2015 1505   HGBUR SMALL (A) 03/07/2020 1123   BILIRUBINUR NEGATIVE 03/07/2020 1123   BILIRUBINUR neg 03/13/2015 1858   KETONESUR NEGATIVE 03/07/2020 1123   PROTEINUR >=300 (A) 03/07/2020 1123   UROBILINOGEN 0.2 11/19/2015 1505   NITRITE NEGATIVE 03/07/2020 1123   LEUKOCYTESUR MODERATE (A) 03/07/2020 1123   Sepsis Labs Invalid input(s): PROCALCITONIN,  WBC,  LACTICIDVEN Microbiology Recent Results (from the past 240 hour(s))  Blood Culture (routine x 2)     Status: None   Collection Time: 03/07/20 11:03 AM   Specimen: BLOOD  Result Value Ref Range Status   Specimen Description BLOOD RIGHT THUMB  Final   Special Requests   Final    BOTTLES DRAWN AEROBIC AND ANAEROBIC Blood Culture adequate volume   Culture   Final    NO GROWTH 5 DAYS Performed at [facility], [address] [place], [place] [address]    Report Status 03/12/2020 FINAL  Final  Urine culture     Status: Abnormal   Collection Time: 03/07/20 11:23 AM   Specimen: In/Out Cath Urine  Result Value Ref Range Status   Specimen Description IN/OUT CATH URINE  Final   Special Requests   Final    NONE Performed at [facility], [place] [address] [place], [place] [address]    Culture (A)  Final    >=100,000 COLONIES/mL ENTEROCOCCUS FAECALIS >=100,000 COLONIES/mL STAPHYLOCOCCUS LUGDUNENSIS VANCOMYCIN RESISTANT ENTEROCOCCUS ISOLATED    Report Status 03/10/2020 FINAL  Final   Organism ID,  Bacteria STAPHYLOCOCCUS LUGDUNENSIS (A)  Final   Organism ID, Bacteria ENTEROCOCCUS FAECALIS (A)  Final      Susceptibility   Enterococcus faecalis - MIC*    AMPICILLIN <=2 SENSITIVE Sensitive     NITROFURANTOIN <=16 SENSITIVE Sensitive     VANCOMYCIN >=32 RESISTANT Resistant     LINEZOLID 2 SENSITIVE Sensitive     * >=100,000 COLONIES/mL ENTEROCOCCUS FAECALIS   Staphylococcus lugdunensis - MIC*    CIPROFLOXACIN <=0.5 SENSITIVE Sensitive     GENTAMICIN <=0.5 SENSITIVE Sensitive     NITROFURANTOIN <=16 SENSITIVE Sensitive     OXACILLIN >=4 RESISTANT Resistant     TETRACYCLINE <=1 SENSITIVE Sensitive     VANCOMYCIN <=0.5 SENSITIVE Sensitive     TRIMETH/SULFA <=10 SENSITIVE Sensitive     CLINDAMYCIN RESISTANT Resistant     RIFAMPIN <=0.5 SENSITIVE Sensitive     Inducible Clindamycin POSITIVE Resistant     * >=100,000 COLONIES/mL STAPHYLOCOCCUS LUGDUNENSIS  Blood Culture (routine x 2)     Status: None   Collection Time: 03/07/20 11:28 AM   Specimen: BLOOD  Result Value Ref Range Status   Specimen Description BLOOD REJ  Final  Special Requests   Final    BOTTLES DRAWN AEROBIC AND ANAEROBIC Blood Culture adequate volume   Culture   Final    NO GROWTH 5 DAYS Performed at [facility], [place] [address], [place], [place] [address]    Report Status 03/12/2020 FINAL  Final  SARS Coronavirus 2 by RT PCR (hospital order, performed in [facility] hospital lab) Nasopharyngeal Nasopharyngeal Swab     Status: None   Collection Time: 03/07/20 11:28 AM   Specimen: Nasopharyngeal Swab  Result Value Ref Range Status   SARS Coronavirus 2 NEGATIVE NEGATIVE Final    Comment: (NOTE) SARS-CoV-2 target nucleic acids are NOT DETECTED.  The SARS-CoV-2 RNA is generally detectable in upper and lower respiratory specimens during the acute phase of infection. The lowest concentration of SARS-CoV-2 viral copies this assay can detect is 250 copies / mL. A negative result does not preclude SARS-CoV-2  infection and should not be used as the sole basis for treatment or other patient management decisions.  A negative result may occur with improper specimen collection / handling, submission of specimen other than nasopharyngeal swab, presence of viral mutation(s) within the areas targeted by this assay, and inadequate number of viral copies (<250 copies / mL). A negative result must be combined with clinical observations, patient history, and epidemiological information.  Fact Sheet for Patients:   StrictlyIdeas.no  Fact Sheet for Healthcare Providers: BankingDealers.co.za  This test is not yet approved or  cleared by the [place] FDA and has been authorized for detection and/or diagnosis of SARS-CoV-2 by FDA under an Emergency Use Authorization (EUA).  This EUA will remain in effect (meaning this test can be used) for the duration of the COVID-19 declaration under Section 564(b)(1) of the Act, 21 U.S.C. section 360bbb-3(b)(1), unless the authorization is terminated or revoked sooner.  Performed at [facility], [place] [address] [place], [place] [address]   MRSA PCR Screening     Status: None   Collection Time: 03/08/20  6:14 AM   Specimen: Nasopharyngeal  Result Value Ref Range Status   MRSA by PCR NEGATIVE NEGATIVE Final    Comment:        The GeneXpert MRSA Assay (FDA approved for NASAL specimens only), is one component of a comprehensive MRSA colonization surveillance program. It is not intended to diagnose MRSA infection nor to guide or monitor treatment for MRSA infections. Performed at [facility], [place] [address], [place], [place] [address]   SARS Coronavirus 2 by RT PCR (hospital order, performed in [facility] hospital lab) Nasopharyngeal Nasopharyngeal Swab     Status: None   Collection Time: 03/13/20 11:22 AM   Specimen: Nasopharyngeal Swab  Result Value Ref Range Status   SARS Coronavirus 2  NEGATIVE NEGATIVE Final    Comment: (NOTE) SARS-CoV-2 target nucleic acids are NOT DETECTED.  The SARS-CoV-2 RNA is generally detectable in upper and lower respiratory specimens during the acute phase of infection. The lowest concentration of SARS-CoV-2 viral copies this assay can detect is 250 copies / mL. A negative result does not preclude SARS-CoV-2 infection and should not be used as the sole basis for treatment or other patient management decisions.  A negative result may occur with improper specimen collection / handling, submission of specimen other than nasopharyngeal swab, presence of viral mutation(s) within the areas targeted by this assay, and inadequate number of viral copies (<250 copies / mL). A negative result must be combined with clinical observations, patient history,  and epidemiological information.  Fact Sheet for Patients:   StrictlyIdeas.no  Fact Sheet for Healthcare Providers: BankingDealers.co.za  This test is not yet approved or  cleared by the [place] FDA and has been authorized for detection and/or diagnosis of SARS-CoV-2 by FDA under an Emergency Use Authorization (EUA).  This EUA will remain in effect (meaning this test can be used) for the duration of the COVID-19 declaration under Section 564(b)(1) of the Act, 21 U.S.C. section 360bbb-3(b)(1), unless the authorization is terminated or revoked sooner.  Performed at [facility], [place] [address] [place], [place] [address]      Patient was seen and examined on the day of discharge and was found to be in stable condition. Time coordinating discharge: 35 minutes including assessment and coordination of care, as well as examination of the patient.   SIGNED:  Dessa Phi, DO [facility] 03/15/2020, 9:18 AM

## 2020-03-15 NOTE — Progress Notes (Signed)
Report given to Precious, receiving nurse at [facility]. All questions answered. Patient en route to [facility] via [place] at this time.

## 2020-03-15 NOTE — Progress Notes (Signed)
[facility] Progress Note   Subjective:   Patient seen in room. Per notes, discharging to SNF today. Patient reports she is still feeling very tired. Denies SOB, CP, palpitations, dizziness, nausea, vomiting.   Objective Vitals:   03/14/20 2100 03/15/20 0614 03/15/20 0925 03/15/20 1047  BP: (!) 174/75 (!) 184/80 (!) 184/77 (!) 158/74  Pulse: 65 61  62  Resp: 16 13 20    Temp: 99.1 F (37.3 C) 98.2 F (36.8 C)  97.8 F (36.6 C)  TempSrc: Oral Oral  Oral  SpO2: 98% 96%    Weight:  64.9 kg    Height:       Physical Exam General: Well developed, tired appearing female in NAD Heart: RRR, no murmurs, rubs or gallops Lungs: CTA bilatearlly without wheezing, rhonchi or rales Abdomen: Soft, mild TTP epigastric region, non-distended, +BS Extremities: Trace edema b/l lower extremities Dialysis Access:  R [facility]  Additional Objective Labs: Basic Metabolic Panel: Recent Labs  Lab 03/10/20 1324 03/10/20 1324 03/12/20 0557 03/13/20 0913 03/14/20 0628  NA 140   < > 136 140 140  K 5.3*   < > 4.6 4.2 4.5  CL 102   < > 98 101 100  CO2 22   < > 25 26 26   GLUCOSE 98   < > 99 108* 157*  BUN 48*   < > 34* 25* 44*  CREATININE 5.63*   < > 4.28* 3.81* 5.15*  CALCIUM 8.5*   < > 8.8* 9.1 9.0  PHOS 6.9*  --   --   --   --    < > = values in this interval not displayed.   Liver Function Tests: Recent Labs  Lab 03/12/20 0557 03/13/20 0913 03/14/20 0628  AST 43* 39 37  ALT 71* 59* 53*  ALKPHOS 101 104 97  BILITOT 0.7 0.8 0.5  PROT 6.4* 6.8 6.5  ALBUMIN 2.6* 2.9* 3.0*   Recent Labs  Lab 03/12/20 0557  LIPASE 25   CBC: Recent Labs  Lab 03/09/20 0824 03/09/20 0824 03/10/20 1324 03/10/20 1324 03/12/20 0557 03/13/20 0913 03/14/20 0628  WBC 10.4   < > 7.9   < > 6.1 5.6 6.3  NEUTROABS  --   --  3.8  --  2.1  --  2.2  HGB 13.3   < > 13.3   < > 13.0 13.4 12.5  HCT 44.6   < > 42.8   < > 43.2 43.9 42.3  MCV 85.9  --  85.6  --  85.0 85.9 86.5  PLT 174   < > 159    < > 162 157 156   < > = values in this interval not displayed.   Blood Culture    Component Value Date/Time   SDES BLOOD REJ 03/07/2020 1128   SPECREQUEST  03/07/2020 1128    BOTTLES DRAWN AEROBIC AND ANAEROBIC Blood Culture adequate volume   CULT  03/07/2020 1128    NO GROWTH 5 DAYS Performed at [facility], [place] [address], [place], [place] [address]    REPTSTATUS 03/12/2020 FINAL 03/07/2020 1128    CBG: Recent Labs  Lab 03/14/20 0633 03/14/20 1159 03/14/20 1702 03/14/20 2128 03/15/20 0815  GLUCAP 137* 119* 149* 151* 129*   Medications:  . amLODipine  10 mg Oral Daily  . amoxicillin  500 mg Oral q1800  . carvedilol  25 mg Oral BID  . Chlorhexidine Gluconate Cloth  6 each Topical Q0600  . Chlorhexidine Gluconate Cloth  6 each Topical Q0600  . docusate sodium  100 mg Oral BID  . gabapentin  200 mg Oral QHS  . heparin  5,000 Units Subcutaneous Q8H  . hydrALAZINE  10 mg Oral Q8H  . insulin aspart  0-5 Units Subcutaneous QHS  . insulin aspart  0-6 Units Subcutaneous TID WC  . metoCLOPramide (REGLAN) injection  5 mg Intravenous Q8H  . minocycline  100 mg Oral BID  . polyethylene glycol  17 g Oral Daily  . sevelamer carbonate  800 mg Oral TID WC    Dialysis Orders: [facility] M[facility] 3.5 hrs 350/700 70.5 kg. [facility] TDC -Heparin 5000 units IV initial bolus then 500 units IV hourly-on heparin pump at OP center.   Assessment/Plan: 1. UTI - enterococcus faecalis per urine culture. Seen by ID, recommending doxy and amoxicillin.  2. ESRD- on HD MWF.K+ at goal.Continue MWF schedule 3. Hypertension/Volume-BLE edema throughout admission which is improving. BP elevated, on amlodipine and carvedilol. Hydralazine added and BP improving. Continue to follow outpatient and titrate meds as needed. Patient has lost weight this admission and is well below her outpatient EDW, recommend new EDW ~65kg. 4. DMT2-per primary 5. Anemia ckd - Hb12.5, no ESA indicated at this time. 6. MBD ckd  -Calcium controlled, phos 8.2>6.9.Started renvela 1 per mealbut then held with no change in GI sx, will resume. KUB unremarkable. Getting reglan and bowel regimen.  7. Dispo - ok for dc from renal standpoint.    Anice Paganini, PA-C 03/15/2020, 11:01 AM  [facility] Pager: [phone]

## 2020-03-15 NOTE — TOC Progression Note (Addendum)
Transition of Care [facility]) - Transition Note   Patient Details  Name: [name] [name] MRN: [id number] Date of Birth: [birth year]  Transition of Care [facility]) CM/SW [place], [place] Phone Number: [phone] 03/15/2020, 9:43 AM  Clinical Narrative:    Patient will DC to:?[facility] Anticipated DC date:?03/15/20 Family notified:?[name] [name]- attempted to call Transport by: [name] [name]   Per MD patient ready for DC to [facility] RN, patient, patient's family, and facility notified of DC. Attempted to call patient's son [name] [name] however phone was disconnected. Discharge Summary sent to facility. RN given number for report [phone] room 102A. DC packet on chart. Ambulance transport requested for patient.   CSW signing off.   [name] [name], [place] [phone]    Expected Discharge Plan: [place] Barriers to Discharge: No Barriers Identified  Expected Discharge Plan and Services Expected Discharge Plan: [place] arrangements for the past 2 months: [place] Expected Discharge Date: 03/15/20                                     Social Determinants of Health (SDOH) Interventions    Readmission Risk Interventions No flowsheet data found.

## 2020-05-27 ENCOUNTER — Emergency Department (HOSPITAL_COMMUNITY): Payer: Medicare Other

## 2020-05-27 ENCOUNTER — Encounter (HOSPITAL_COMMUNITY): Payer: Self-pay

## 2020-05-27 ENCOUNTER — Inpatient Hospital Stay (HOSPITAL_COMMUNITY)
Admission: EM | Admit: 2020-05-27 | Discharge: 2020-06-04 | DRG: 304 | Disposition: A | Discharge status: Home Health | Payer: Medicare Other | Attending: Internal Medicine | Admitting: Internal Medicine

## 2020-05-27 ENCOUNTER — Other Ambulatory Visit: Payer: Self-pay

## 2020-05-27 DIAGNOSIS — Z794 Long term (current) use of insulin: Secondary | ICD-10-CM

## 2020-05-27 DIAGNOSIS — I120 Hypertensive chronic kidney disease with stage 5 chronic kidney disease or end stage renal disease: Secondary | ICD-10-CM | POA: Diagnosis present

## 2020-05-27 DIAGNOSIS — Z20822 Contact with and (suspected) exposure to covid-19: Secondary | ICD-10-CM | POA: Diagnosis present

## 2020-05-27 DIAGNOSIS — D631 Anemia in chronic kidney disease: Secondary | ICD-10-CM | POA: Diagnosis present

## 2020-05-27 DIAGNOSIS — Z90710 Acquired absence of both cervix and uterus: Secondary | ICD-10-CM

## 2020-05-27 DIAGNOSIS — IMO0002 Reserved for concepts with insufficient information to code with codable children: Secondary | ICD-10-CM | POA: Diagnosis present

## 2020-05-27 DIAGNOSIS — Z9049 Acquired absence of other specified parts of digestive tract: Secondary | ICD-10-CM

## 2020-05-27 DIAGNOSIS — H543 Unqualified visual loss, both eyes: Secondary | ICD-10-CM | POA: Diagnosis present

## 2020-05-27 DIAGNOSIS — E1122 Type 2 diabetes mellitus with diabetic chronic kidney disease: Secondary | ICD-10-CM | POA: Diagnosis present

## 2020-05-27 DIAGNOSIS — N186 End stage renal disease: Secondary | ICD-10-CM | POA: Diagnosis not present

## 2020-05-27 DIAGNOSIS — Z833 Family history of diabetes mellitus: Secondary | ICD-10-CM

## 2020-05-27 DIAGNOSIS — E1165 Type 2 diabetes mellitus with hyperglycemia: Secondary | ICD-10-CM | POA: Diagnosis present

## 2020-05-27 DIAGNOSIS — R296 Repeated falls: Secondary | ICD-10-CM | POA: Diagnosis present

## 2020-05-27 DIAGNOSIS — I161 Hypertensive emergency: Secondary | ICD-10-CM | POA: Diagnosis not present

## 2020-05-27 DIAGNOSIS — Z9851 Tubal ligation status: Secondary | ICD-10-CM

## 2020-05-27 DIAGNOSIS — K219 Gastro-esophageal reflux disease without esophagitis: Secondary | ICD-10-CM | POA: Diagnosis present

## 2020-05-27 DIAGNOSIS — Z79899 Other long term (current) drug therapy: Secondary | ICD-10-CM

## 2020-05-27 DIAGNOSIS — Z8249 Family history of ischemic heart disease and other diseases of the circulatory system: Secondary | ICD-10-CM

## 2020-05-27 DIAGNOSIS — Z992 Dependence on renal dialysis: Secondary | ICD-10-CM

## 2020-05-27 DIAGNOSIS — I160 Hypertensive urgency: Secondary | ICD-10-CM

## 2020-05-27 DIAGNOSIS — Z888 Allergy status to other drugs, medicaments and biological substances status: Secondary | ICD-10-CM

## 2020-05-27 DIAGNOSIS — I10 Essential (primary) hypertension: Secondary | ICD-10-CM | POA: Diagnosis present

## 2020-05-27 DIAGNOSIS — E118 Type 2 diabetes mellitus with unspecified complications: Secondary | ICD-10-CM | POA: Diagnosis present

## 2020-05-27 DIAGNOSIS — M549 Dorsalgia, unspecified: Secondary | ICD-10-CM | POA: Diagnosis present

## 2020-05-27 DIAGNOSIS — E1140 Type 2 diabetes mellitus with diabetic neuropathy, unspecified: Secondary | ICD-10-CM | POA: Diagnosis present

## 2020-05-27 LAB — BASIC METABOLIC PANEL
Anion gap: 15 (ref 5–15)
BUN: 55 mg/dL — ABNORMAL HIGH (ref 6–20)
CO2: 23 mmol/L (ref 22–32)
Calcium: 8.9 mg/dL (ref 8.9–10.3)
Chloride: 101 mmol/L (ref 98–111)
Creatinine, Ser: 5.54 mg/dL — ABNORMAL HIGH (ref 0.44–1.00)
GFR, Estimated: 9 mL/min — ABNORMAL LOW (ref >60)
Glucose, Bld: 261 mg/dL — ABNORMAL HIGH (ref 70–99)
Potassium: 4.2 mmol/L (ref 3.5–5.1)
Sodium: 139 mmol/L (ref 135–145)

## 2020-05-27 LAB — CBC
HCT: 39.8 % (ref 36.0–46.0)
Hemoglobin: 12.1 g/dL (ref 12.0–15.0)
MCH: 26.4 pg (ref 26.0–34.0)
MCHC: 30.4 g/dL (ref 30.0–36.0)
MCV: 86.9 fL (ref 80.0–100.0)
Platelets: 150 10*3/uL (ref 150–400)
RBC: 4.58 MIL/uL (ref 3.87–5.11)
RDW: 18.7 % — ABNORMAL HIGH (ref 11.5–15.5)
WBC: 7.3 10*3/uL (ref 4.0–10.5)
nRBC: 0 % (ref 0.0–0.2)

## 2020-05-27 LAB — RESPIRATORY PANEL BY RT PCR (FLU A&B, COVID)
Influenza A by PCR: NEGATIVE
Influenza B by PCR: NEGATIVE
SARS Coronavirus 2 by RT PCR: NEGATIVE

## 2020-05-27 MED ORDER — LABETALOL HCL 5 MG/ML IV SOLN
10.0000 mg | Freq: Once | INTRAVENOUS | Status: AC
  Administered 2020-05-27: 10 mg via INTRAVENOUS
  Filled 2020-05-27: qty 4

## 2020-05-27 MED ORDER — DIPHENHYDRAMINE HCL 50 MG/ML IJ SOLN
25.0000 mg | Freq: Once | INTRAMUSCULAR | Status: AC
  Administered 2020-05-27: 25 mg via INTRAVENOUS
  Filled 2020-05-27: qty 1

## 2020-05-27 MED ORDER — PROCHLORPERAZINE EDISYLATE 10 MG/2ML IJ SOLN
10.0000 mg | Freq: Once | INTRAMUSCULAR | Status: AC
  Administered 2020-05-27: 10 mg via INTRAVENOUS
  Filled 2020-05-27: qty 2

## 2020-05-27 MED ORDER — HYDRALAZINE HCL 25 MG PO TABS
25.0000 mg | ORAL_TABLET | Freq: Once | ORAL | Status: AC
  Administered 2020-05-27: 25 mg via ORAL
  Filled 2020-05-27: qty 1

## 2020-05-27 MED ORDER — LABETALOL HCL 5 MG/ML IV SOLN
20.0000 mg | Freq: Once | INTRAVENOUS | Status: AC
  Administered 2020-05-27: 20 mg via INTRAVENOUS
  Filled 2020-05-27: qty 4

## 2020-05-27 MED ORDER — NICARDIPINE HCL IN NACL 20-0.86 MG/200ML-% IV SOLN
3.0000 mg/h | INTRAVENOUS | Status: DC
  Administered 2020-05-27: 5 mg/h via INTRAVENOUS
  Filled 2020-05-27: qty 200

## 2020-05-27 NOTE — ED Triage Notes (Signed)
Pt from dialysis center with ems c.o headache, htn, blurred vision and nausea for the past week. Pt compliant with home BP meds. Dialysis ususally MWF, but pt was going today due to holiday. Last dialysis was Sunday. Pt a.o BP 230/110 with ems

## 2020-05-27 NOTE — Consult Note (Signed)
NAME:  [name] [name], MRN:  [id number], DOB:  [birth year], LOS: 0 ADMISSION DATE:  05/27/2020, CONSULTATION DATE:  05/27/20 REFERRING MD:  EDP, CHIEF COMPLAINT:  headache   Brief History   54 y.o. F with PMH of ESRD on dialysis M/W/F, poorly controlled hypertension, diabetes, GERD who presented with to ED with uncontrolled HTN 245/112.  CT head negative and started on Cardene gtt so PCCM consulted for admission  History of present illness   [name] [name] is a 54 year old female with PMH of ESRD on dialysis M/W/F, poorly controlled hypertension, diabetes, GERD who was sent to the ED from dialysis for elevated blood pressure. She states that her blood pressure has been for 3 to 4 weeks. She saw her PCP who increased amlodipine. She is also noted intermittent headaches and nausea/vomiting her physician prescribed Zofran. She denies any weakness, numbness, tingling, vision changes beyond her baseline (is supposed to have laser eye surgery when BP controlled). She denies any chest pain, shortness of breath, lower extremity edema or other complaints.  On presentation to the ED her blood pressure was 245/112 labs without significant metabolic derangements and CT head without acute findings, she was given labetalol and hydralazine and started on a Cardene drip. MRI head was ordered, though patient needed to have BM (no diarrhea) and so was brought back to her room.  She states that it was not uncontrolled, she had just not been given the opportunity to go since arrival.  She still c/o frontal HA, not thunderclap, denies nausea or CP.  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   Significant Hospital Events   11/23  admit  Consults:    Procedures:    Significant Diagnostic Tests:  11/23 CT head>>no acute findings  Micro Data:  11/23 Covid-19 and Flu>>pending  Antimicrobials:    Interim history/subjective:  As above  Objective   Blood pressure (!) 215/90, pulse 72, temperature 98.7 F (37.1 C), temperature source Oral, resp. rate (!) 23, SpO2 100 %.       No intake or output data in the 24 hours ending 05/27/20 2221 There were no vitals filed for this visit.  General:  Well-appearing F, resting in no distress HEENT: MM pink/moist, pupils equal and reactive Neuro: alert and oriented, denies numbness or tingling, chronic blurred vision no worse, EOMI  CV: s1s2 rrr, no m/r/g PULM:  CTAB GI: soft, bsx4 active  Extremities: warm/dry, no edema  Skin: no rashes or lesions   Resolved Hospital Problem list     Assessment & Plan:   Hypertensive Emergency BP poorly controlled for several weeks and associated with headache and nausea, negative head CT and MRI pending P: -Goal SBP 180 overnight, initially on cardene gtt, but titrated off in the ED.  -Check CXR and troponin -Echocardiogram      Labs   CBC: Recent Labs  Lab 05/27/20 1429  WBC 7.3  HGB 12.1  HCT 39.8  MCV 86.9  PLT 166    Basic Metabolic Panel: Recent Labs  Lab 05/27/20 1429  NA 139  K 4.2  CL 101  CO2 23  GLUCOSE 261*  BUN 55*  CREATININE 5.54*  CALCIUM 8.9   GFR: CrCl cannot be calculated (Unknown ideal weight.). Recent Labs  Lab 05/27/20 1429  WBC 7.3    Liver Function Tests: No results for input(s): AST, ALT, ALKPHOS, BILITOT, PROT, ALBUMIN in the last 168 hours. No results for input(s): LIPASE, AMYLASE in the last 168 hours. No results for input(s): AMMONIA in the last 168 hours.  ABG    Component Value Date/Time   PHART 7.429 (H) 11/05/2009 1457   PCO2ART 35.8 11/05/2009 1457   PO2ART 67.0 (L) 11/05/2009 1457   HCO3 29.8 (H) 10/10/2014 2025   TCO2 23 03/07/2020 1145   O2SAT  15.0 10/10/2014 2025     Coagulation Profile: No results for input(s): INR, PROTIME in the last 168 hours.  Cardiac Enzymes: No results for input(s): CKTOTAL, CKMB, CKMBINDEX, TROPONINI in the last 168 hours.  HbA1C: Hgb A1c MFr Bld  Date/Time Value Ref Range Status  03/09/2020 08:24 AM 5.8 (H) 4.8 - 5.6 % Final    Comment:    (NOTE) Pre diabetes:          5.7%-6.4%  Diabetes:              >6.4%  Glycemic control for   <7.0% adults with diabetes   11/19/2015 03:05 PM 16.3 (H) 4.6 - 6.5 % Final    Comment:    Glycemic Control Guidelines for People with Diabetes:Non Diabetic:  <6%Goal of Therapy: <7%Additional Action Suggested:  >8%     CBG: No results for input(s): GLUCAP in the last 168 hours.  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   Surgical History    Past Surgical History:  Procedure Laterality Date  . ABDOMINAL HYSTERECTOMY    . CATARACT EXTRACTION    . CESAREAN SECTION    . CHOLECYSTECTOMY    . EYE SURGERY Left retinal  . GANGLION CYST EXCISION    . TONSILLECTOMY    . TUBAL LIGATION       Social History   reports that she has never smoked. She has never used smokeless tobacco. She reports that she does not drink alcohol and does not use drugs.   Family History   Her family history includes Aneurysm in her mother; Diabetes in her father; Heart attack in her father.   Allergies Allergies  Allergen Reactions  . Amitriptyline Swelling    Other reaction(s): Confusion (intolerance)  . Ciprofloxacin Diarrhea and Nausea And Vomiting    VOMITING Severe vomiting requiring ED visit, IV reglan and IV  fluids VOMITING   . Semaglutide Nausea Only  . Tramadol Itching  . Tylenol [Acetaminophen] Itching     Home Medications  Prior to Admission medications   Medication Sig Start Date End Date Taking? Authorizing Provider  amLODipine (NORVASC) 10 MG tablet Take 10 mg by mouth daily. 02/25/20  Yes [provider]  Ascorbic Acid (VITAMIN C) 100 MG tablet Take 100 mg by mouth daily.   Yes [provider]  atropine 1 % ophthalmic solution Place 1 drop into the right eye 3 (three) times daily.   Yes [provider]  B Complex-C (B-COMPLEX WITH VITAMIN C) tablet Take 1 tablet by mouth daily.   Yes [provider]  bisacodyl (DULCOLAX) 10 MG suppository Place 10 mg rectally daily as needed for moderate constipation.   Yes  [provider]  carvedilol (COREG) 25 MG tablet Take 25 mg by mouth 2 (two) times daily. 03/04/20  Yes [provider]  gabapentin (NEURONTIN) 100 MG capsule Take 200 mg by mouth at bedtime.   Yes [provider]  gabapentin (NEURONTIN) 100 MG capsule Take 100 mg by mouth daily.   Yes [provider]  hydrALAZINE (APRESOLINE) 10 MG tablet Take 10 mg by mouth every 4 (four) hours as needed (BP if > 165/100).   Yes [provider]  Insulin Glargine 300 UNIT/ML SOPN Inject 13 Units into the skin daily. Patient taking differently: Inject 10 Units into the skin daily.  03/15/20  Yes Dessa Phi, DO  insulin lispro (HUMALOG) 100 UNIT/ML injection Inject 2-14 Units into the skin daily. Per sliding scale: BG  201-250= 2 units, 251-300 = 4 units, 301-350= 6 units, 351-400= 8 units, 401-450= 10 units, 451-500 = 12 units, 500 or greater = 14 units.   Yes [provider]  isosorbide dinitrate (ISORDIL) 20 MG tablet Take 20 mg by mouth 3 (three) times daily.   Yes [provider]  LACTOBACILLUS EXTRA STRENGTH PO Take 1 capsule by mouth daily.   Yes [provider]  Magnesium 400 MG TABS  Take 400 mg by mouth 2 (two) times daily.   Yes [provider]  oxyCODONE (ROXICODONE) 5 MG immediate release tablet Take 1 tablet (5 mg total) by mouth every 6 (six) hours as needed for severe pain. 03/15/20  Yes Dessa Phi, DO  polyethylene glycol (MIRALAX / GLYCOLAX) 17 g packet Take 17 g by mouth daily as needed for mild constipation.    Yes [provider]  sennosides-docusate sodium (SENOKOT-S) 8.6-50 MG tablet Take 1 tablet by mouth daily as needed for constipation.    Yes [provider]  dicyclomine (BENTYL) 20 MG tablet Take 1 tablet (20 mg total) by mouth 2 (two) times daily. Patient not taking: Reported on 03/07/2020 03/11/17   Charlesetta Shanks, MD  omeprazole (PRILOSEC) 20 MG capsule Take 1 capsule (20 mg total) by mouth daily. Patient not taking: Reported on 03/07/2020 06/18/17   Carlisle Cater, PA-C  ondansetron (ZOFRAN) 8 MG tablet Take 1 tablet (8 mg total) by mouth every 6 (six) hours as needed for nausea or vomiting. Patient not taking: Reported on 03/07/2020 03/21/17   Mesner, Corene Cornea, MD     Critical care time: 35 minutes     CRITICAL CARE Performed by: Otilio Carpen Saisha Hogue   Total critical care time: 35 minutes  Critical care time was exclusive of separately billable procedures and treating other patients.  Critical care was necessary to treat or prevent imminent or life-threatening deterioration.  Critical care was time spent personally by me on the following activities: development of treatment plan with patient and/or surrogate as well as nursing, discussions with consultants, evaluation of patient's response to treatment, examination of patient, obtaining history from patient or surrogate, ordering and performing treatments and interventions, ordering and review of laboratory studies, ordering and review of radiographic studies, pulse oximetry and re-evaluation of patient's condition.   Otilio Carpen Quasean Frye, PA-C [place] [facility]  Pager# [phone], if no  answer [phone]

## 2020-05-27 NOTE — ED Notes (Signed)
Pt transported to CT ?

## 2020-05-27 NOTE — ED Provider Notes (Signed)
[facility] EMERGENCY DEPARTMENT Provider Note   CSN: [id number] Arrival date & time: 05/27/20  1426     History Chief Complaint  Patient presents with  . Hypertension  . Headache    [name] [name] is a 54 y.o. female.  HPI      54yo female with history of hypertension, DM, colitis, ESRD on dialysis MWF presents with concern for hypertension, headache, blurred vision, nausea and vomiting.   Blood pressure won't come down, on three different kinds of blood pressure pills but they are not helping at all, running high for the last 3 weeks  Headache severe, blurred vision, nausea and vomiting Has been goin gon for hte last few weeks Dialysis center insisted she come to ED today as blood pressures were significantly elevated, couldn't start dialysis  Pressures higher today then they had been  Same headache for several weeks, 9/10, pounding, nothing makes it better or worse. Took oxycodone and benadryl without relief.  Just made her sleepy. No trauma or falls.  No fevers. Constant. Waking up with it at night.  Both eyes blurry like the room filled with smoke-was supposed to have laser surgery but won't do it with blood pressures this high. Had surgery previously. Eye doctor aware of worsening vision.  No double vision or missing pieces of vision.  Nausea and vomiting, vomiting 2-3 times per day, wakes up in the middle of the night vomiting sometimes  Neuropathy bilat feet but no new numbness/weaknesss, difficulty talking or walking, or facial droop.  Has had dizziness/room spinning also since increase in blood pressure  Dr. Lajoyce Corners PCP has been following symptoms.  Went from 5mg  to 10mg  with amlodipine, given nausea medication as well but throws that up-didn't realize it was ODT  Taking amlopidine, hydralazine every 4hr prn (which ends up every 4hr), carvedilol 25mg , isosorbide   Past Medical History:  Diagnosis Date  . Colitis   . Diabetes mellitus   . Family  history of adverse reaction to anesthesia    " my uncle's heart stoped "  . GERD (gastroesophageal reflux disease)   . Heart murmur   . Hypertension   . 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 Mitral regurgitation   . Neuropathy in diabetes ([place])   . Syncope and collapse 07/28/2015    Patient Active Problem List   Diagnosis Date Noted  . Hypertensive emergency   . Pressure injury of skin 03/12/2020  . Sepsis ([place]) 03/07/2020  . ESRD (end stage renal disease) ([place]) 03/07/2020  . Hyperkalemia 03/07/2020  . Acute cystitis 03/07/2020  . Acute metabolic encephalopathy 97/67/3419  . 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 Diabetes mellitus with complication ([place])   . Intractable headache   . Syncope 07/28/2015  . Chronic headaches 07/28/2015  . Chest pain 05/22/2015  . Urinary frequency 03/13/2015  . Easy bruising 10/09/2014  . Encounter for preventative adult health care exam with abnormal findings 01/11/2014  . Chronic constipation 01/11/2014  . GERD (gastroesophageal reflux disease) 01/11/2014  . Helicobacter positive gastritis 12/06/2013  . Dizziness and giddiness 10/12/2013  . Essential hypertension 09/19/2013  . Back pain 06/11/2013  . Lumbosacral spondylosis without myelopathy 02/20/2013  . Diabetic neuropathy ([place]) 01/09/2013  . DM (diabetes mellitus), type 2, uncontrolled ([place]) 10/02/2012  . MITRAL REGURGITATION, mild 10/31/2009  . PLEURAL EFFUSION, RIGHT after pyelonephritis in April 2011 (no intervention) 10/20/2009    Past Surgical History:  Procedure Laterality Date  . ABDOMINAL HYSTERECTOMY    .  CATARACT EXTRACTION    . CESAREAN SECTION    . CHOLECYSTECTOMY    . EYE SURGERY Left retinal  . GANGLION CYST EXCISION    . TONSILLECTOMY    . TUBAL LIGATION       OB History   No obstetric history on file.     Family History  Problem Relation Age of Onset  . Diabetes Father   . Heart attack Father   . Aneurysm Mother     Social History    Tobacco Use  . Smoking status: Never Smoker  . Smokeless tobacco: Never Used  Substance Use Topics  . Alcohol use: No    Alcohol/week: 0.0 standard drinks  . Drug use: No    Home Medications Prior to Admission medications   Medication Sig Start Date End Date Taking? Authorizing Provider  amLODipine (NORVASC) 10 MG tablet Take 10 mg by mouth daily. 02/25/20  Yes [provider]  Ascorbic Acid (VITAMIN C) 100 MG tablet Take 100 mg by mouth daily.   Yes [provider]  atropine 1 % ophthalmic solution Place 1 drop into the right eye 3 (three) times daily.   Yes [provider]  B Complex-C (B-COMPLEX WITH VITAMIN C) tablet Take 1 tablet by mouth daily.   Yes [provider]  bisacodyl (DULCOLAX) 10 MG suppository Place 10 mg rectally daily as needed for moderate constipation.   Yes [provider]  carvedilol (COREG) 25 MG tablet Take 25 mg by mouth 2 (two) times daily. 03/04/20  Yes [provider]  gabapentin (NEURONTIN) 100 MG capsule Take 200 mg by mouth at bedtime.   Yes [provider]  gabapentin (NEURONTIN) 100 MG capsule Take 100 mg by mouth daily.   Yes [provider]  hydrALAZINE (APRESOLINE) 10 MG tablet Take 10 mg by mouth every 4 (four) hours as needed (BP if > 165/100).   Yes [provider]  Insulin Glargine 300 UNIT/ML SOPN Inject 13 Units into the skin daily. Patient taking differently: Inject 10 Units into the skin daily.  03/15/20  Yes Dessa Phi, DO  insulin lispro (HUMALOG) 100 UNIT/ML injection Inject 2-14 Units into the skin daily. Per sliding scale: BG  201-250= 2 units, 251-300 = 4 units, 301-350= 6 units, 351-400= 8 units, 401-450= 10 units, 451-500 = 12 units, 500 or greater = 14 units.   Yes [provider]  isosorbide dinitrate (ISORDIL) 20 MG tablet Take 20 mg by mouth 3 (three) times daily.   Yes [provider]  LACTOBACILLUS EXTRA STRENGTH PO Take 1 capsule  by mouth daily.   Yes [provider]  Magnesium 400 MG TABS Take 400 mg by mouth 2 (two) times daily.   Yes [provider]  oxyCODONE (ROXICODONE) 5 MG immediate release tablet Take 1 tablet (5 mg total) by mouth every 6 (six) hours as needed for severe pain. 03/15/20  Yes Dessa Phi, DO  polyethylene glycol (MIRALAX / GLYCOLAX) 17 g packet Take 17 g by mouth daily as needed for mild constipation.    Yes [provider]  sennosides-docusate sodium (SENOKOT-S) 8.6-50 MG tablet Take 1 tablet by mouth daily as needed for constipation.    Yes [provider]    Allergies    Amitriptyline, Ciprofloxacin, Semaglutide, Tramadol, and Tylenol [acetaminophen]  Review of Systems   Review of Systems  Constitutional: Negative for fever.  HENT: Negative for sore throat.   Eyes: Positive for visual disturbance.  Respiratory: Negative for cough  and shortness of breath.   Cardiovascular: Negative for chest pain.  Gastrointestinal: Positive for nausea and vomiting. Negative for abdominal pain.  Genitourinary: Negative for difficulty urinating (sometimes makes urine).  Musculoskeletal: Negative for back pain and neck pain.  Skin: Negative for rash.  Neurological: Positive for dizziness and headaches. Negative for syncope, facial asymmetry, speech difficulty, weakness and numbness.    Physical Exam Updated Vital Signs BP (!) 202/96 (BP Location: Left Arm)   Pulse 64   Temp 98.3 F (36.8 C) (Oral)   Resp 12   SpO2 99%   Physical Exam Vitals and nursing note reviewed.  Constitutional:      General: She is not in acute distress.    Appearance: Normal appearance. She is well-developed. She is not ill-appearing or diaphoretic.  HENT:     Head: Normocephalic and atraumatic.  Eyes:     General: No visual field deficit.    Extraocular Movements: Extraocular movements intact.     Conjunctiva/sclera: Conjunctivae normal.     Pupils: Pupils are equal, round, and  reactive to light.  Cardiovascular:     Rate and Rhythm: Normal rate and regular rhythm.     Pulses: Normal pulses.     Heart sounds: Normal heart sounds. No murmur heard.  No friction rub. No gallop.   Pulmonary:     Effort: Pulmonary effort is normal. No respiratory distress.     Breath sounds: Normal breath sounds. No wheezing or rales.  Abdominal:     General: There is no distension.     Palpations: Abdomen is soft.     Tenderness: There is no abdominal tenderness. There is no guarding.  Musculoskeletal:        General: No swelling or tenderness.     Cervical back: Normal range of motion.  Skin:    General: Skin is warm and dry.     Findings: No erythema or rash.  Neurological:     General: No focal deficit present.     Mental Status: She is alert and oriented to person, place, and time.     GCS: GCS eye subscore is 4. GCS verbal subscore is 5. GCS motor subscore is 6.     Cranial Nerves: No cranial nerve deficit, dysarthria or facial asymmetry.     Sensory: No sensory deficit.     Motor: No weakness or tremor.     Coordination: Coordination normal. Finger-Nose-Finger Test normal.     Gait: Gait normal.     ED Results / Procedures / Treatments   Labs (all labs ordered are listed, but only abnormal results are displayed) Labs Reviewed  BASIC METABOLIC PANEL - Abnormal; Notable for the following components:      Result Value   Glucose, Bld 261 (*)    BUN 55 (*)    Creatinine, Ser 5.54 (*)    GFR, Estimated 9 (*)    All other components within normal limits  CBC - Abnormal; Notable for the following components:   RDW 18.7 (*)    All other components within normal limits  GLUCOSE, CAPILLARY - Abnormal; Notable for the following components:   Glucose-Capillary 124 (*)    All other components within normal limits  RESPIRATORY PANEL BY RT PCR (FLU A&B, COVID)  COMPREHENSIVE METABOLIC PANEL  CBC  TROPONIN I (HIGH SENSITIVITY)    EKG EKG  Interpretation  Date/Time:  Tuesday May 27 2020 14:32:00 EST Ventricular Rate:  64 PR Interval:  162 QRS Duration: 84 QT Interval:  460 QTC Calculation: 474 R Axis:   -18 Text Interpretation: Normal sinus rhythm Possible Anterior infarct , age undetermined Abnormal ECG No significant change since last tracing Confirmed by Gareth Morgan [phone]) on 05/27/2020 3:29:57 PM   Radiology CT Head Wo Contrast  Result Date: 05/27/2020 CLINICAL DATA:  Headache, intracranial hemorrhage suspected. Hypertension. EXAM: CT HEAD WITHOUT CONTRAST TECHNIQUE: Contiguous axial images were obtained from the base of the skull through the vertex without intravenous contrast. COMPARISON:  CT head 04/29/2020. FINDINGS: Brain: No evidence of large-territorial acute infarction. No parenchymal hemorrhage. No mass lesion. No extra-axial collection. No mass effect or midline shift. No hydrocephalus. Basilar cisterns are patent. Vascular: No hyperdense vessel. Atherosclerotic calcifications are present within the cavernous internal carotid arteries. Skull: No acute fracture or focal lesion. Sinuses/Orbits: Right sphenoid sinus mucosal thickening. Otherwise paranasal sinuses and mastoid air cells are clear. The orbits are unremarkable. Other: None. IMPRESSION: No acute intracranial abnormality. Electronically Signed   By: Iven Finn M.D.   On: 05/27/2020 18:57   MR BRAIN WO CONTRAST  Result Date: 05/28/2020 CLINICAL DATA:  Blurry vision and nausea EXAM: MRI HEAD WITHOUT CONTRAST TECHNIQUE: Multiplanar, multiecho pulse sequences of the brain and surrounding structures were obtained without intravenous contrast. COMPARISON:  Brain MRI 06/04/2019 FINDINGS: Brain: No acute infarct, acute hemorrhage or extra-axial collection. Normal white matter signal. Normal volume of CSF spaces. No chronic microhemorrhage. Normal midline structures. Vascular: Major flow voids are preserved. Skull and upper cervical spine: Normal  calvarium and skull base. Visualized upper cervical spine and soft tissues are normal. Sinuses/Orbits:No paranasal sinus fluid levels or advanced mucosal thickening. No mastoid or middle ear effusion. Normal orbits. IMPRESSION: Normal brain MRI. Electronically Signed   By: Ulyses Jarred M.D.   On: 05/28/2020 01:07   DG CHEST PORT 1 VIEW  Result Date: 05/27/2020 CLINICAL DATA:  Headache, hypertension, blurred vision, and nausea for the past week. EXAM: PORTABLE CHEST 1 VIEW COMPARISON:  03/07/2020 FINDINGS: Dialysis catheter with tip over the cavoatrial junction region. Mild cardiac enlargement. No vascular congestion, edema, or consolidation. No pleural effusions. No pneumothorax. Mediastinal contours appear intact. Surgical clips in the right upper quadrant. IMPRESSION: Mild cardiac enlargement. Electronically Signed   By: Lucienne Capers M.D.   On: 05/27/2020 23:48    Procedures .Critical Care Performed by: Gareth Morgan, MD Authorized by: Gareth Morgan, MD   Critical care provider statement:    Critical care time (minutes):  30   Critical care was time spent personally by me on the following activities:  Discussions with consultants, evaluation of patient's response to treatment, examination of patient, ordering and performing treatments and interventions, ordering and review of laboratory studies, ordering and review of radiographic studies, pulse oximetry, re-evaluation of patient's condition, obtaining history from patient or surrogate and review of old charts   (including critical care time)  Medications Ordered in ED Medications  amLODipine (NORVASC) tablet 10 mg (has no administration in time range)  carvedilol (COREG) tablet 25 mg (has no administration in time range)  isosorbide dinitrate (ISORDIL) tablet 20 mg (has no administration in time range)  bisacodyl (DULCOLAX) suppository 10 mg (has no administration in time range)  acidophilus (RISAQUAD) capsule 1 capsule (has no  administration in time range)  polyethylene glycol (MIRALAX / GLYCOLAX) packet 17 g (has no administration in time range)  gabapentin (NEURONTIN) capsule 100 mg (has no administration in time range)  magnesium oxide (MAG-OX) tablet 400 mg (has no administration in time range)  atropine 1 % ophthalmic  solution 1 drop (has no administration in time range)  heparin injection 5,000 Units (has no administration in time range)  sodium chloride flush (NS) 0.9 % injection 3 mL (3 mLs Intravenous Given 05/28/20 0116)  hydrALAZINE (APRESOLINE) injection 5 mg (has no administration in time range)  hydrALAZINE (APRESOLINE) tablet 10 mg (has no administration in time range)  insulin aspart (novoLOG) injection 0-6 Units (has no administration in time range)  gabapentin (NEURONTIN) capsule 200 mg (has no administration in time range)  insulin glargine (LANTUS) injection 5 Units (has no administration in time range)  labetalol (NORMODYNE) injection 10 mg (10 mg Intravenous Given 05/27/20 1606)  prochlorperazine (COMPAZINE) injection 10 mg (10 mg Intravenous Given 05/27/20 1605)  diphenhydrAMINE (BENADRYL) injection 25 mg (25 mg Intravenous Given 05/27/20 1604)  hydrALAZINE (APRESOLINE) tablet 25 mg (25 mg Oral Given 05/27/20 1738)  labetalol (NORMODYNE) injection 10 mg (10 mg Intravenous Given 05/27/20 1738)  labetalol (NORMODYNE) injection 20 mg (20 mg Intravenous Given 05/27/20 1938)    ED Course  I have reviewed the triage vital signs and the nursing notes.  Pertinent labs & imaging results that were available during my care of the patient were reviewed by me and considered in my medical decision making (see chart for details).    MDM Rules/Calculators/A&P                          54yo female with history of hypertension, DM, colitis, ESRD on dialysis MWF presents with concern for hypertension, headache, blurred vision, nausea and vomiting. CT without signs of acute abnormality.   BP 240s, given  labetalol x 2 and hydralazine with minimal improvement and pt reports continuing headache. Will admit for hypertensive urgency. MR ordered given visual changes, eval for PRES. Admitted for further care. BP improved.    Final Clinical Impression(s) / ED Diagnoses Final diagnoses:  Hypertensive urgency    Rx / DC Orders ED Discharge Orders    None       Gareth Morgan, MD 05/28/20 [phone]

## 2020-05-27 NOTE — ED Notes (Addendum)
Pt returned from MRI. Pt was having uncontrollable bowel movements in MRI and thus was brought back. Pt removed from bed pan. Sheets changed and perineal care performed. Pt clean and comfortable at this time.

## 2020-05-27 NOTE — ED Notes (Signed)
Pt returned from CT °

## 2020-05-27 NOTE — ED Notes (Signed)
Pt transported to MRI 

## 2020-05-27 NOTE — Progress Notes (Signed)
Attempted to get pt ready for MRI. Pt having uncontrollable bowel movements. Procedure will be attempted later on.

## 2020-05-28 ENCOUNTER — Other Ambulatory Visit (HOSPITAL_COMMUNITY): Payer: Medicare Other

## 2020-05-28 ENCOUNTER — Emergency Department (HOSPITAL_COMMUNITY): Payer: Medicare Other

## 2020-05-28 ENCOUNTER — Observation Stay (HOSPITAL_COMMUNITY): Payer: Medicare Other

## 2020-05-28 DIAGNOSIS — Z79899 Other long term (current) drug therapy: Secondary | ICD-10-CM | POA: Diagnosis not present

## 2020-05-28 DIAGNOSIS — K219 Gastro-esophageal reflux disease without esophagitis: Secondary | ICD-10-CM | POA: Diagnosis present

## 2020-05-28 DIAGNOSIS — Z888 Allergy status to other drugs, medicaments and biological substances status: Secondary | ICD-10-CM | POA: Diagnosis not present

## 2020-05-28 DIAGNOSIS — H543 Unqualified visual loss, both eyes: Secondary | ICD-10-CM | POA: Diagnosis present

## 2020-05-28 DIAGNOSIS — Z90710 Acquired absence of both cervix and uterus: Secondary | ICD-10-CM | POA: Diagnosis not present

## 2020-05-28 DIAGNOSIS — E1140 Type 2 diabetes mellitus with diabetic neuropathy, unspecified: Secondary | ICD-10-CM | POA: Diagnosis present

## 2020-05-28 DIAGNOSIS — Z9851 Tubal ligation status: Secondary | ICD-10-CM | POA: Diagnosis not present

## 2020-05-28 DIAGNOSIS — Z992 Dependence on renal dialysis: Secondary | ICD-10-CM | POA: Diagnosis not present

## 2020-05-28 DIAGNOSIS — Z794 Long term (current) use of insulin: Secondary | ICD-10-CM | POA: Diagnosis not present

## 2020-05-28 DIAGNOSIS — Z8249 Family history of ischemic heart disease and other diseases of the circulatory system: Secondary | ICD-10-CM | POA: Diagnosis not present

## 2020-05-28 DIAGNOSIS — I161 Hypertensive emergency: Secondary | ICD-10-CM | POA: Diagnosis present

## 2020-05-28 DIAGNOSIS — Z833 Family history of diabetes mellitus: Secondary | ICD-10-CM | POA: Diagnosis not present

## 2020-05-28 DIAGNOSIS — E1122 Type 2 diabetes mellitus with diabetic chronic kidney disease: Secondary | ICD-10-CM | POA: Diagnosis present

## 2020-05-28 DIAGNOSIS — E1165 Type 2 diabetes mellitus with hyperglycemia: Secondary | ICD-10-CM | POA: Diagnosis present

## 2020-05-28 DIAGNOSIS — Z20822 Contact with and (suspected) exposure to covid-19: Secondary | ICD-10-CM | POA: Diagnosis present

## 2020-05-28 DIAGNOSIS — I120 Hypertensive chronic kidney disease with stage 5 chronic kidney disease or end stage renal disease: Secondary | ICD-10-CM | POA: Diagnosis present

## 2020-05-28 DIAGNOSIS — R296 Repeated falls: Secondary | ICD-10-CM | POA: Diagnosis present

## 2020-05-28 DIAGNOSIS — M549 Dorsalgia, unspecified: Secondary | ICD-10-CM | POA: Diagnosis present

## 2020-05-28 DIAGNOSIS — D631 Anemia in chronic kidney disease: Secondary | ICD-10-CM | POA: Diagnosis present

## 2020-05-28 DIAGNOSIS — Z9049 Acquired absence of other specified parts of digestive tract: Secondary | ICD-10-CM | POA: Diagnosis not present

## 2020-05-28 DIAGNOSIS — N186 End stage renal disease: Secondary | ICD-10-CM | POA: Diagnosis present

## 2020-05-28 LAB — COMPREHENSIVE METABOLIC PANEL
ALT: 24 U/L (ref 0–44)
AST: 20 U/L (ref 15–41)
Albumin: 3.4 g/dL — ABNORMAL LOW (ref 3.5–5.0)
Alkaline Phosphatase: 88 U/L (ref 38–126)
Anion gap: 14 (ref 5–15)
BUN: 60 mg/dL — ABNORMAL HIGH (ref 6–20)
CO2: 23 mmol/L (ref 22–32)
Calcium: 8.7 mg/dL — ABNORMAL LOW (ref 8.9–10.3)
Chloride: 103 mmol/L (ref 98–111)
Creatinine, Ser: 6.28 mg/dL — ABNORMAL HIGH (ref 0.44–1.00)
GFR, Estimated: 7 mL/min — ABNORMAL LOW (ref >60)
Glucose, Bld: 150 mg/dL — ABNORMAL HIGH (ref 70–99)
Potassium: 4.1 mmol/L (ref 3.5–5.1)
Sodium: 140 mmol/L (ref 135–145)
Total Bilirubin: 0.6 mg/dL (ref 0.3–1.2)
Total Protein: 6.6 g/dL (ref 6.5–8.1)

## 2020-05-28 LAB — CBC
HCT: 36.3 % (ref 36.0–46.0)
Hemoglobin: 11.4 g/dL — ABNORMAL LOW (ref 12.0–15.0)
MCH: 26.4 pg (ref 26.0–34.0)
MCHC: 31.4 g/dL (ref 30.0–36.0)
MCV: 84 fL (ref 80.0–100.0)
Platelets: 142 10*3/uL — ABNORMAL LOW (ref 150–400)
RBC: 4.32 MIL/uL (ref 3.87–5.11)
RDW: 18.6 % — ABNORMAL HIGH (ref 11.5–15.5)
WBC: 9.4 10*3/uL (ref 4.0–10.5)
nRBC: 0 % (ref 0.0–0.2)

## 2020-05-28 LAB — GLUCOSE, CAPILLARY
Glucose-Capillary: 124 mg/dL — ABNORMAL HIGH (ref 70–99)
Glucose-Capillary: 106 mg/dL — ABNORMAL HIGH (ref 70–99)
Glucose-Capillary: 150 mg/dL — ABNORMAL HIGH (ref 70–99)
Glucose-Capillary: 148 mg/dL — ABNORMAL HIGH (ref 70–99)
Glucose-Capillary: 214 mg/dL — ABNORMAL HIGH (ref 70–99)

## 2020-05-28 LAB — TROPONIN I (HIGH SENSITIVITY): Troponin I (High Sensitivity): 17 ng/L (ref <18)

## 2020-05-28 MED ORDER — LIDOCAINE-PRILOCAINE 2.5-2.5 % EX CREA: 1.0000 "application " | TOPICAL_CREAM | CUTANEOUS | Status: DC | PRN

## 2020-05-28 MED ORDER — HYDRALAZINE HCL 10 MG PO TABS
10.0000 mg | ORAL_TABLET | Freq: Three times a day (TID) | ORAL | Status: DC
  Administered 2020-05-28: 10 mg via ORAL
  Filled 2020-05-28: qty 1

## 2020-05-28 MED ORDER — OXYCODONE HCL 5 MG PO TABS: 5.0000 mg | ORAL_TABLET | Freq: Four times a day (QID) | ORAL | Status: DC | PRN

## 2020-05-28 MED ORDER — OXYCODONE HCL 5 MG PO TABS
5.0000 mg | ORAL_TABLET | ORAL | Status: AC | PRN
  Administered 2020-05-28 (×2): 5 mg via ORAL
  Filled 2020-05-28 (×2): qty 1

## 2020-05-28 MED ORDER — HYDRALAZINE HCL 20 MG/ML IJ SOLN
5.0000 mg | INTRAMUSCULAR | Status: DC | PRN
  Filled 2020-05-28: qty 1

## 2020-05-28 MED ORDER — LABETALOL HCL 5 MG/ML IV SOLN
10.0000 mg | Freq: Once | INTRAVENOUS | Status: AC | PRN
  Administered 2020-05-28: 10 mg via INTRAVENOUS
  Filled 2020-05-28: qty 4

## 2020-05-28 MED ORDER — CARVEDILOL 25 MG PO TABS
25.0000 mg | ORAL_TABLET | Freq: Two times a day (BID) | ORAL | Status: DC
  Administered 2020-05-28 – 2020-06-03 (×11): 25 mg via ORAL
  Filled 2020-05-28 (×11): qty 1

## 2020-05-28 MED ORDER — CHLORHEXIDINE GLUCONATE CLOTH 2 % EX PADS
6.0000 | MEDICATED_PAD | Freq: Every day | CUTANEOUS | Status: DC
  Administered 2020-05-28: 6 via TOPICAL

## 2020-05-28 MED ORDER — INSULIN GLARGINE 100 UNIT/ML ~~LOC~~ SOLN: 5.0000 [IU] | Freq: Every day | SUBCUTANEOUS | Status: DC

## 2020-05-28 MED ORDER — ATROPINE SULFATE 1 % OP SOLN
1.0000 [drp] | Freq: Three times a day (TID) | OPHTHALMIC | Status: DC
  Filled 2020-05-28 (×2): qty 2

## 2020-05-28 MED ORDER — LABETALOL HCL 5 MG/ML IV SOLN: 10.0000 mg | Freq: Once | INTRAVENOUS | Status: DC

## 2020-05-28 MED ORDER — AMLODIPINE BESYLATE 10 MG PO TABS
10.0000 mg | ORAL_TABLET | Freq: Every day | ORAL | Status: DC
  Administered 2020-05-28 – 2020-06-03 (×6): 10 mg via ORAL
  Filled 2020-05-28 (×6): qty 1

## 2020-05-28 MED ORDER — HEPARIN SODIUM (PORCINE) 1000 UNIT/ML DIALYSIS: 1000.0000 [IU] | INTRAMUSCULAR | Status: DC | PRN

## 2020-05-28 MED ORDER — INSULIN GLARGINE 100 UNIT/ML ~~LOC~~ SOLN
5.0000 [IU] | Freq: Every day | SUBCUTANEOUS | Status: DC
  Administered 2020-05-28 – 2020-06-03 (×7): 5 [IU] via SUBCUTANEOUS
  Filled 2020-05-28 (×10): qty 0.05

## 2020-05-28 MED ORDER — BISACODYL 10 MG RE SUPP
10.0000 mg | Freq: Every day | RECTAL | Status: DC | PRN
  Administered 2020-05-30 – 2020-06-02 (×2): 10 mg via RECTAL
  Filled 2020-05-28 (×2): qty 1

## 2020-05-28 MED ORDER — GABAPENTIN 100 MG PO CAPS
100.0000 mg | ORAL_CAPSULE | Freq: Every day | ORAL | Status: DC
  Administered 2020-05-28 – 2020-06-03 (×7): 100 mg via ORAL
  Filled 2020-05-28 (×7): qty 1

## 2020-05-28 MED ORDER — SODIUM CHLORIDE 0.9 % IV SOLN: 100.0000 mL | INTRAVENOUS | Status: DC | PRN

## 2020-05-28 MED ORDER — HYDRALAZINE HCL 25 MG PO TABS
25.0000 mg | ORAL_TABLET | Freq: Three times a day (TID) | ORAL | Status: DC
  Administered 2020-05-28 – 2020-05-29 (×2): 25 mg via ORAL
  Filled 2020-05-28 (×3): qty 1

## 2020-05-28 MED ORDER — HYDRALAZINE HCL 20 MG/ML IJ SOLN
10.0000 mg | INTRAMUSCULAR | Status: DC | PRN
  Administered 2020-05-29: 10 mg via INTRAVENOUS
  Filled 2020-05-28: qty 1

## 2020-05-28 MED ORDER — MAGNESIUM OXIDE 400 (241.3 MG) MG PO TABS
400.0000 mg | ORAL_TABLET | Freq: Two times a day (BID) | ORAL | Status: DC
  Administered 2020-05-28 – 2020-06-03 (×13): 400 mg via ORAL
  Filled 2020-05-28 (×13): qty 1

## 2020-05-28 MED ORDER — POLYETHYLENE GLYCOL 3350 17 G PO PACK: 17.0000 g | PACK | Freq: Every day | ORAL | Status: DC | PRN

## 2020-05-28 MED ORDER — DIPHENHYDRAMINE HCL 25 MG PO CAPS
25.0000 mg | ORAL_CAPSULE | Freq: Four times a day (QID) | ORAL | Status: AC | PRN
  Administered 2020-05-28: 25 mg via ORAL
  Filled 2020-05-28: qty 1

## 2020-05-28 MED ORDER — RISAQUAD PO CAPS
1.0000 | ORAL_CAPSULE | Freq: Every day | ORAL | Status: DC
  Administered 2020-05-28 – 2020-06-03 (×6): 1 via ORAL
  Filled 2020-05-28 (×7): qty 1

## 2020-05-28 MED ORDER — DIPHENHYDRAMINE HCL 25 MG PO CAPS
25.0000 mg | ORAL_CAPSULE | Freq: Three times a day (TID) | ORAL | Status: DC | PRN
  Administered 2020-05-28 – 2020-05-31 (×5): 25 mg via ORAL
  Filled 2020-05-28 (×6): qty 1

## 2020-05-28 MED ORDER — SODIUM CHLORIDE 0.9% FLUSH
3.0000 mL | Freq: Two times a day (BID) | INTRAVENOUS | Status: DC
  Administered 2020-05-28 – 2020-06-03 (×14): 3 mL via INTRAVENOUS

## 2020-05-28 MED ORDER — CHLORHEXIDINE GLUCONATE CLOTH 2 % EX PADS
6.0000 | MEDICATED_PAD | Freq: Every day | CUTANEOUS | Status: DC
  Administered 2020-05-28 – 2020-05-29 (×2): 6 via TOPICAL

## 2020-05-28 MED ORDER — PENTAFLUOROPROP-TETRAFLUOROETH EX AERO: 1.0000 "application " | INHALATION_SPRAY | CUTANEOUS | Status: DC | PRN

## 2020-05-28 MED ORDER — INSULIN ASPART 100 UNIT/ML ~~LOC~~ SOLN
0.0000 [IU] | Freq: Three times a day (TID) | SUBCUTANEOUS | Status: DC
  Administered 2020-05-29 – 2020-05-30 (×4): 2 [IU] via SUBCUTANEOUS
  Administered 2020-05-31: 1 [IU] via SUBCUTANEOUS
  Administered 2020-06-01: 2 [IU] via SUBCUTANEOUS
  Administered 2020-06-02: 1 [IU] via SUBCUTANEOUS
  Administered 2020-06-03: 2 [IU] via SUBCUTANEOUS

## 2020-05-28 MED ORDER — ISOSORBIDE DINITRATE 20 MG PO TABS
20.0000 mg | ORAL_TABLET | Freq: Three times a day (TID) | ORAL | Status: DC
  Administered 2020-05-28 – 2020-06-03 (×16): 20 mg via ORAL
  Filled 2020-05-28 (×21): qty 1

## 2020-05-28 MED ORDER — HEPARIN SODIUM (PORCINE) 1000 UNIT/ML IJ SOLN
INTRAMUSCULAR | Status: AC
  Filled 2020-05-28: qty 5

## 2020-05-28 MED ORDER — ALTEPLASE 2 MG IJ SOLR: 2.0000 mg | Freq: Once | INTRAMUSCULAR | Status: DC | PRN

## 2020-05-28 MED ORDER — OXYCODONE HCL 5 MG PO TABS
5.0000 mg | ORAL_TABLET | ORAL | Status: DC | PRN
  Administered 2020-05-28 – 2020-05-30 (×6): 5 mg via ORAL
  Filled 2020-05-28 (×7): qty 1

## 2020-05-28 MED ORDER — HEPARIN SODIUM (PORCINE) 5000 UNIT/ML IJ SOLN
5000.0000 [IU] | Freq: Three times a day (TID) | INTRAMUSCULAR | Status: DC
  Administered 2020-05-28 – 2020-06-03 (×18): 5000 [IU] via SUBCUTANEOUS
  Filled 2020-05-28 (×18): qty 1

## 2020-05-28 MED ORDER — LIDOCAINE HCL (PF) 1 % IJ SOLN: 5.0000 mL | INTRAMUSCULAR | Status: DC | PRN

## 2020-05-28 MED ORDER — GABAPENTIN 100 MG PO CAPS
200.0000 mg | ORAL_CAPSULE | Freq: Every day | ORAL | Status: DC
  Administered 2020-05-28 – 2020-06-03 (×7): 200 mg via ORAL
  Filled 2020-05-28 (×7): qty 2

## 2020-05-28 NOTE — Progress Notes (Signed)
Notified MD about pt. MEWS (Yellow) as well as pt. With c/o HA with increase in BP. Orders given and carried out. Will continue to monitor and will update MD if needed.See PCR for vitals

## 2020-05-28 NOTE — Progress Notes (Signed)
PT Cancellation Note  Patient Details Name: [name] [name] MRN: [id number] DOB: 08/07/1965   Cancelled Treatment:    Reason Eval/Treat Not Completed: Patient at procedure or test/unavailable. Will re-attempt later if time allows, otherwise will evaluate tomorrow.    Clea Dubach 05/28/2020, 1:17 PM

## 2020-05-28 NOTE — Progress Notes (Signed)
Echo attempted at 2:00, patient in dialysis

## 2020-05-28 NOTE — H&P (Signed)
History and Physical   [name] [name] FGH:[id number] DOB: 05-Nov-1965 DOA: 05/27/2020  PCP: Myrtis Hopping., MD   Patient coming from: Home, but sent from dialysis center  Chief Complaint: Headache, blurry vision, nausea vomiting  HPI: [name] [name] is a 54 y.o. female with medical history significant of ESRD on HD MWF, back pain, diabetes, neuropathy, GERD who presents with headache, blurry vision, nausea vomiting.  Patient has a history of malignant hypertension with poor control despite multiple antihypertensives she has had some issues with access to her medications in the past but she is able to have all her medications now and has had persistent hypertension despite this.  All of her medications hydralazine as prescribed as needed and she has been taking it several times a day for the last 2 weeks as her blood pressure has been persistently elevated .  She has a history of blurry vision and is being evaluated for possible laser eye surgery, but states her vision has been significantly more blurry for the past 2 weeks.  In the past 2 weeks she additionally reports headache, nausea, vomiting.  She is typically on dialysis Monday Wednesday Friday but is changed a holiday schedule she last dialyzed on Sunday and her next day she was due was Tuesday but she was instructed to report to the ED instead due to her severely elevated blood pressure. Denies chest pain, shortness of breath, abdominal pain, constipation or diarrhea.  ED Course: Vital signs in the BP initially 678L systolic, improved to 381O to 180s with drip and then as needed IV.  Lab work-up showed BMP with creatinine consistent with end-stage renal disease, glucose 260.  Normal CBC.  Negative respiratory panel for flu and Covid.  Troponin ordered and pending.  Echocardiogram also ordered.  CT head without acute changes chest x-ray with cardiomegaly.  MRI ordered and pending.  Review of Systems: As per HPI otherwise all other systems reviewed  and are negative.   Past Medical History:  Diagnosis Date  . Colitis   . Diabetes mellitus   . Family history of adverse reaction to anesthesia    " my uncle's heart stoped "  . GERD (gastroesophageal reflux disease)   . Heart murmur   . Hypertension   . 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 Mitral regurgitation   . Neuropathy in diabetes ([place])   . Syncope and collapse 07/28/2015    Past Surgical History:  Procedure Laterality Date  . ABDOMINAL HYSTERECTOMY    . CATARACT EXTRACTION    . CESAREAN SECTION    . CHOLECYSTECTOMY    . EYE SURGERY Left retinal  . GANGLION CYST EXCISION    . TONSILLECTOMY    . TUBAL LIGATION      Social History  reports that she has never smoked. She has never used smokeless tobacco. She reports that she does not drink alcohol and does not use drugs.  Allergies  Allergen Reactions  . Amitriptyline Swelling    Other reaction(s): Confusion (intolerance)  . Ciprofloxacin Diarrhea and Nausea And Vomiting    VOMITING Severe vomiting requiring ED visit, IV reglan and IV fluids VOMITING   . Semaglutide Nausea Only  . Tramadol Itching  . Tylenol [Acetaminophen] Itching    Family History  Problem Relation Age of Onset  . Diabetes Father   . Heart attack Father   . Aneurysm Mother   Reviewed on admission  Prior to Admission medications   Medication Sig Start Date End Date Taking?  Authorizing Provider  amLODipine (NORVASC) 10 MG tablet Take 10 mg by mouth daily. 02/25/20  Yes [provider]  Ascorbic Acid (VITAMIN C) 100 MG tablet Take 100 mg by mouth daily.   Yes [provider]  atropine 1 % ophthalmic solution Place 1 drop into the right eye 3 (three) times daily.   Yes [provider]  B Complex-C (B-COMPLEX WITH VITAMIN C) tablet Take 1 tablet by mouth daily.   Yes [provider]  bisacodyl (DULCOLAX) 10 MG suppository Place 10 mg rectally daily as needed for moderate constipation.   Yes [provider]  carvedilol (COREG) 25 MG tablet Take 25 mg by mouth 2 (two) times daily. 03/04/20  Yes [provider]  gabapentin (NEURONTIN) 100 MG capsule Take 200 mg by mouth at bedtime.   Yes [provider]  gabapentin (NEURONTIN) 100 MG capsule Take 100 mg by mouth daily.   Yes [provider]  hydrALAZINE (APRESOLINE) 10 MG tablet Take 10 mg by mouth every 4 (four) hours as needed (BP if > 165/100).   Yes [provider]  Insulin Glargine 300 UNIT/ML SOPN Inject 13 Units into the skin daily. Patient taking differently: Inject 10 Units into the skin daily.  03/15/20  Yes Dessa Phi, DO  insulin lispro (HUMALOG) 100 UNIT/ML injection Inject 2-14 Units into the skin daily. Per sliding scale: BG  201-250= 2 units, 251-300 = 4 units, 301-350= 6 units, 351-400= 8 units, 401-450= 10 units, 451-500 = 12 units, 500 or greater = 14 units.   Yes [provider]  isosorbide dinitrate (ISORDIL) 20 MG tablet Take 20 mg by mouth 3 (three) times daily.   Yes [provider]  LACTOBACILLUS EXTRA STRENGTH PO Take 1 capsule by mouth daily.   Yes [provider]  Magnesium 400 MG TABS Take 400 mg by mouth 2 (two) times daily.   Yes [provider]  oxyCODONE (ROXICODONE) 5 MG immediate release tablet Take 1 tablet (5 mg total) by mouth every 6 (six) hours as needed for severe pain. 03/15/20  Yes Dessa Phi, DO  polyethylene glycol (MIRALAX / GLYCOLAX) 17 g packet Take 17 g by mouth daily as needed for mild constipation.    Yes [provider]  sennosides-docusate sodium (SENOKOT-S) 8.6-50 MG tablet Take 1 tablet by mouth daily as needed for constipation.    Yes [provider]  dicyclomine (BENTYL) 20 MG tablet Take 1 tablet (20 mg total) by mouth 2 (two) times daily. Patient not taking: Reported on 03/07/2020 03/11/17   Charlesetta Shanks, MD  omeprazole (PRILOSEC) 20 MG capsule Take 1 capsule (20 mg total) by mouth  daily. Patient not taking: Reported on 03/07/2020 06/18/17   Carlisle Cater, PA-C  ondansetron (ZOFRAN) 8 MG tablet Take 1 tablet (8 mg total) by mouth every 6 (six) hours as needed for nausea or vomiting. Patient not taking: Reported on 03/07/2020 03/21/17   Merrily Pew, MD    Physical Exam: Vitals:   05/27/20 2315 05/27/20 2330 05/27/20 2339 05/27/20 2345  BP: (!) 151/76 (!) 161/82 (!) 168/83 (!) 167/87  Pulse: 63 63 63 62  Resp: 12 11 13 11   Temp:      TempSrc:      SpO2: 99% 99% 99% 99%   Physical Exam Constitutional:      Appearance: Normal appearance.     Comments: In discomfort, apparently secondary to headache  HENT:     Head: Normocephalic and atraumatic.  Mouth/Throat:     Mouth: Mucous membranes are moist.     Pharynx: Oropharynx is clear.  Eyes:     Extraocular Movements: Extraocular movements intact.     Pupils: Pupils are equal, round, and reactive to light.  Cardiovascular:     Rate and Rhythm: Normal rate and regular rhythm.     Pulses: Normal pulses.     Heart sounds: Murmur heard.   Pulmonary:     Effort: Pulmonary effort is normal. No respiratory distress.     Breath sounds: Normal breath sounds.  Abdominal:     General: Bowel sounds are normal. There is no distension.     Palpations: Abdomen is soft.     Tenderness: There is no abdominal tenderness.  Musculoskeletal:        General: No swelling or deformity.     Right lower leg: Edema present.     Left lower leg: Edema present.  Skin:    General: Skin is warm and dry.  Neurological:     General: No focal deficit present.     Mental Status: Mental status is at baseline.    Labs on Admission: I have personally reviewed following labs and imaging studies  CBC: Recent Labs  Lab 05/27/20 1429  WBC 7.3  HGB 12.1  HCT 39.8  MCV 86.9  PLT 270    Basic Metabolic Panel: Recent Labs  Lab 05/27/20 1429  NA 139  K 4.2  CL 101  CO2 23  GLUCOSE 261*  BUN 55*  CREATININE 5.54*  CALCIUM  8.9    GFR: CrCl cannot be calculated (Unknown ideal weight.).  Liver Function Tests: No results for input(s): AST, ALT, ALKPHOS, BILITOT, PROT, ALBUMIN in the last 168 hours.  Urine analysis:    Component Value Date/Time   COLORURINE STRAW (A) 03/07/2020 1123   APPEARANCEUR TURBID (A) 03/07/2020 1123   LABSPEC 1.019 03/07/2020 1123   PHURINE 7.0 03/07/2020 1123   GLUCOSEU NEGATIVE 03/07/2020 1123   GLUCOSEU >=1000 (A) 11/19/2015 1505   HGBUR SMALL (A) 03/07/2020 1123   BILIRUBINUR NEGATIVE 03/07/2020 1123   BILIRUBINUR neg 03/13/2015 1858   KETONESUR NEGATIVE 03/07/2020 1123   PROTEINUR >=300 (A) 03/07/2020 1123   UROBILINOGEN 0.2 11/19/2015 1505   NITRITE NEGATIVE 03/07/2020 1123   LEUKOCYTESUR MODERATE (A) 03/07/2020 1123    Radiological Exams on Admission: CT Head Wo Contrast  Result Date: 05/27/2020 CLINICAL DATA:  Headache, intracranial hemorrhage suspected. Hypertension. EXAM: CT HEAD WITHOUT CONTRAST TECHNIQUE: Contiguous axial images were obtained from the base of the skull through the vertex without intravenous contrast. COMPARISON:  CT head 04/29/2020. FINDINGS: Brain: No evidence of large-territorial acute infarction. No parenchymal hemorrhage. No mass lesion. No extra-axial collection. No mass effect or midline shift. No hydrocephalus. Basilar cisterns are patent. Vascular: No hyperdense vessel. Atherosclerotic calcifications are present within the cavernous internal carotid arteries. Skull: No acute fracture or focal lesion. Sinuses/Orbits: Right sphenoid sinus mucosal thickening. Otherwise paranasal sinuses and mastoid air cells are clear. The orbits are unremarkable. Other: None. IMPRESSION: No acute intracranial abnormality. Electronically Signed   By: Iven Finn M.D.   On: 05/27/2020 18:57   DG CHEST PORT 1 VIEW  Result Date: 05/27/2020 CLINICAL DATA:  Headache, hypertension, blurred vision, and nausea for the past week. EXAM: PORTABLE CHEST 1 VIEW  COMPARISON:  03/07/2020 FINDINGS: Dialysis catheter with tip over the cavoatrial junction region. Mild cardiac enlargement. No vascular congestion, edema, or consolidation. No pleural effusions. No pneumothorax. Mediastinal contours appear intact. Surgical  clips in the right upper quadrant. IMPRESSION: Mild cardiac enlargement. Electronically Signed   By: Lucienne Capers M.D.   On: 05/27/2020 23:48    EKG: Independently reviewed.  Normal sinus rhythm, 64 bpm  Assessment/Plan Active Problems:   DM (diabetes mellitus), type 2, uncontrolled (HCC)   Diabetic neuropathy (HCC)   Essential hypertension   Diabetes mellitus with complication (HCC)   ESRD (end stage renal disease) ([place])   Hypertensive emergency  Hypertension Hypertensive Emergency > Chronic malignant hypertension requiring multiple medications > Has some chronic vision changes, but reports worsening of her blurry vision for the last 2 weeks with nausea, vomiting and headache. > Turned away at dialysis today for BP in the 258N systolic, was 277O initially in ED. > Has responded to as needed IV antihypertensives in ED, was able to be weaned off Cleviprex drip. > ICU consulted who determined patient is appropriate for floor and as needed antihypertensives. > Chest x-ray noted cardiomegaly, CT head normal, MRI ordered getting persistent nausea vomiting headache and vision changes. - Echocardiogram given cardiomegaly, prior 4 years ago with G1DD - Continue home amlodipine, carvedilol, hydralazine, isosorbide dinitrate - As needed hydralazine for BP greater than 242 systolic - Blood pressure will also benefit from dialysis tomorrow - Follow-up MRI performed in ED - Follow-up troponins - Repeat a.m. labs  ESRD on HD Monday was a Friday > Creatinine 5.5, stable, in ED.  No electrolyte or allergies. > Last HD session was on Sunday due to holiday schedule, patient tried to get HD done today due to holiday schedule but was sent to ED  because of hypertension - Will need AM nephrology consult for HD  Diabetes > On 10 units long-acting and sliding scale insulin at home - Lantus 5 units daily - SSI  Neuropathy - Continue home gabapentin  Back pain - Not needing OxyIR, holding for now  DVT prophylaxis: Heparin Code Status:   Full  Family Communication:  None on admission Disposition Plan:   Patient is from:  Home  Anticipated DC to:  Home  Anticipated DC date:  Pending clinical course  Anticipated DC barriers: None  Consults called:  Critical care, consulted in ED for hypertensive emergency, determined patient was able to be weaned off drip and will be appropriate for floor with as needed's.   Admission status:  Observation, progressive  Severity of Illness: The appropriate patient status for this patient is OBSERVATION. Observation status is judged to be reasonable and necessary in order to provide the required intensity of service to ensure the patient's safety. The patient's presenting symptoms, physical exam findings, and initial radiographic and laboratory data in the context of their medical condition is felt to place them at decreased risk for further clinical deterioration. Furthermore, it is anticipated that the patient will be medically stable for discharge from the hospital within 2 midnights of admission. The following factors support the patient status of observation.   " The patient's presenting symptoms include nausea, vomiting, blurry vision, headache. " The physical exam findings include murmur, edema. " The initial radiographic and laboratory data are stable labs glucose 260 mild cardiac margin on chest x-ray and no acute changes on CT and.   Marcelyn Bruins MD [facility]  How to contact the [facility] Attending or Consulting provider [place] or covering provider during after hours [place], for this patient?   1. Check the care team in [facility] and look for a) attending/consulting [place] provider  listed and b) the  [facility] team listed 2. Log into [email or web address] and use [place]'s universal password to access. If you do not have the password, please contact the hospital operator. 3. Locate the [facility] provider you are looking for under [facility] and page to a number that you can be directly reached. 4. If you still have difficulty reaching the provider, please page the [facility] (Director on Call) for the Hospitalists listed on amion for assistance.  05/28/2020, 12:52 AM

## 2020-05-28 NOTE — Consult Note (Signed)
[facility] Renal Consultation Note  Requesting MD: Neva Seat, MD Indication for Consultation:  ESRD  Chief complaint: blood pressure high at dialysis  HPI: [name] [name] is a 54 y.o. female with history of ESRD, hypertension, diabetes, and GERD who presented to the hospital from her dialysis unit for hypertension on 11/23.  The patient states that she has had a headache and nausea for the past couple of weeks.  No trouble with chest pain or shortness of breath.  She tells me that she has had trouble keeping down her blood pressure medication.  She is not able to confirm her medications for me and states that we have a list.  Spoke with the charge RN at [facility] to get additional history and orders below.  She was previously in a SNF and was discharged home; they state BP was better at her SNF.  She has had HTN for the past couple of weeks and her BP 222/107 and 247/121.  She had reported taking her meds to the HD unit and was sent to the ER from her HD unit.  She had 1.5 kg on 11/23 when she came for the holiday schedule.   covid screen negative  PMHx:   Past Medical History:  Diagnosis Date  . Colitis   . Diabetes mellitus   . Family history of adverse reaction to anesthesia    " my uncle's heart stoped "  . GERD (gastroesophageal reflux disease)   . Heart murmur   . Hypertension   . 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 Mitral regurgitation   . Neuropathy in diabetes ([place])   . Syncope and collapse 07/28/2015  ESRD  Past Surgical History:  Procedure Laterality Date  . ABDOMINAL HYSTERECTOMY    . CATARACT EXTRACTION    . CESAREAN SECTION    . CHOLECYSTECTOMY    . EYE SURGERY Left retinal  . GANGLION CYST EXCISION    . TONSILLECTOMY    . TUBAL LIGATION      Family Hx:  Family History  Problem Relation Age of Onset  . Diabetes Father   . Heart attack Father   . Aneurysm Mother     Social History:  reports that she has never smoked. She  has never used smokeless tobacco. She reports that she does not drink alcohol and does not use drugs.  Allergies:  Allergies  Allergen Reactions  . Amitriptyline Swelling    Other reaction(s): Confusion (intolerance)  . Ciprofloxacin Diarrhea and Nausea And Vomiting    VOMITING Severe vomiting requiring ED visit, IV reglan and IV fluids VOMITING   . Semaglutide Nausea Only  . Tramadol Itching  . Tylenol [Acetaminophen] Itching    Medications: Prior to Admission medications   Medication Sig Start Date End Date Taking? Authorizing Provider  amLODipine (NORVASC) 10 MG tablet Take 10 mg by mouth daily. 02/25/20  Yes [provider]  Ascorbic Acid (VITAMIN C) 100 MG tablet Take 100 mg by mouth daily.   Yes [provider]  atropine 1 % ophthalmic solution Place 1 drop into the right eye 3 (three) times daily.   Yes [provider]  B Complex-C (B-COMPLEX WITH VITAMIN C) tablet Take 1 tablet by mouth daily.   Yes [provider]  bisacodyl (DULCOLAX) 10 MG suppository Place 10 mg rectally daily as needed for moderate constipation.   Yes [provider]  carvedilol (COREG) 25 MG tablet Take 25 mg by mouth 2 (two) times daily.  03/04/20  Yes [provider]  gabapentin (NEURONTIN) 100 MG capsule Take 200 mg by mouth at bedtime.   Yes [provider]  gabapentin (NEURONTIN) 100 MG capsule Take 100 mg by mouth daily.   Yes [provider]  hydrALAZINE (APRESOLINE) 10 MG tablet Take 10 mg by mouth every 4 (four) hours as needed (BP if > 165/100).   Yes [provider]  Insulin Glargine 300 UNIT/ML SOPN Inject 13 Units into the skin daily. Patient taking differently: Inject 10 Units into the skin daily.  03/15/20  Yes Dessa Phi, DO  insulin lispro (HUMALOG) 100 UNIT/ML injection Inject 2-14 Units into the skin daily. Per sliding scale: BG  201-250= 2 units, 251-300 = 4 units, 301-350= 6 units, 351-400= 8 units,  401-450= 10 units, 451-500 = 12 units, 500 or greater = 14 units.   Yes [provider]  isosorbide dinitrate (ISORDIL) 20 MG tablet Take 20 mg by mouth 3 (three) times daily.   Yes [provider]  LACTOBACILLUS EXTRA STRENGTH PO Take 1 capsule by mouth daily.   Yes [provider]  Magnesium 400 MG TABS Take 400 mg by mouth 2 (two) times daily.   Yes [provider]  oxyCODONE (ROXICODONE) 5 MG immediate release tablet Take 1 tablet (5 mg total) by mouth every 6 (six) hours as needed for severe pain. 03/15/20  Yes Dessa Phi, DO  polyethylene glycol (MIRALAX / GLYCOLAX) 17 g packet Take 17 g by mouth daily as needed for mild constipation.    Yes [provider]  sennosides-docusate sodium (SENOKOT-S) 8.6-50 MG tablet Take 1 tablet by mouth daily as needed for constipation.    Yes [provider]    I have reviewed the patient's current and reported prior to admission medications.  Labs:  BMP Latest Ref Rng & Units 05/28/2020 05/27/2020 03/14/2020  Glucose 70 - 99 mg/dL 150(H) 261(H) 157(H)  BUN 6 - 20 mg/dL 60(H) 55(H) 44(H)  Creatinine 0.44 - 1.00 mg/dL 6.28(H) 5.54(H) 5.15(H)  Sodium 135 - 145 mmol/L 140 139 140  Potassium 3.5 - 5.1 mmol/L 4.1 4.2 4.5  Chloride 98 - 111 mmol/L 103 101 100  CO2 22 - 32 mmol/L 23 23 26   Calcium 8.9 - 10.3 mg/dL 8.7(L) 8.9 9.0    ROS:  Pertinent items noted in HPI and remainder of comprehensive ROS otherwise negative.  Physical Exam: Vitals:   05/28/20 0542 05/28/20 0805  BP: (!) 179/82 (!) 180/81  Pulse: 70 70  Resp: 18 19  Temp: 97.9 F (36.6 C) 98.3 F (36.8 C)  SpO2: 100% 100%     General: Adult female in bed in no acute distress at rest HEENT: Normocephalic atraumatic Eyes: Extraocular movements intact sclera anicteric Neck: Neck supple trachea midline Heart: S1-S2 no rub appreciated Lungs: Clear to auscultation bilaterally normal work of breathing at rest Abdomen: Soft nontender  nondistended Extremities: 1+ edema bilateral lower extremities Skin: No rash on extremities exposed Neuro: Alert and oriented x3 provides a history and follows command Psych normal mood and affect Dialysis Access left IJ tunnel catheter  Outpatient HD admit:  [facility] BF 350 DF 700  EDW 65 kg  3.5 hours F180 2K/3.0 Ca Heparin with tx 5000 bolus and 500 hourly  Left IJ Tunn Catheter Last tx on 11/21 with post weight of 65 kg   Assessment/Plan:  # End-stage renal disease - Normally Monday Wednesday Friday at [facility] - Have ordered for her to have dialysis today.  We are trying to challenge her dry weight compared to her reported weight from 11/23 as she may have lost weight with nausea and dec PO intake  # Hypertensive Emergency - Dialysis now to optimize volume status - challenge EDW as above - She is not able to confirm her medications for me - Would continue current home regimen for now - May need to consider addition of clonidine patch if nausea is a recurrent issue.  Would not do clonidine pills as it sounds like there have been issues possibly with compliance both because of nausea and other factors  # Headache  - Felt secondary in part to uncontrolled HTN  - Pain control per primary team   # Anemia CKD  - Mild and no acute need for ESA; BP uncontrolled as well   Disposition per primary team   Claudia Desanctis 05/28/2020, 10:29 AM
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
## 2020-05-28 NOTE — Progress Notes (Signed)
Remaining 1hr and 30mins pt want to be off to the machine. Dr Royce Macadamia informed. AMA was signed by the pt

## 2020-05-28 NOTE — ED Notes (Signed)
Medication not given due to medication not being verified at this time . Will continue to monitor.

## 2020-05-28 NOTE — ED Notes (Signed)
Pt transported to MRI 

## 2020-05-28 NOTE — Progress Notes (Signed)
PROGRESS NOTE    [name] [name]  QIW:[id number] DOB: 02/12/1966 DOA: 05/27/2020 PCP: Myrtis Hopping., MD    Brief Narrative:  [name] [name] is a 54 year old female with past medical history notable for ESRD on HD MWF, hyperlipidemia, type 2 diabetes mellitus, GERD, essential hypertension, neuropathy who presented to the ED with headache, blurred vision and nausea with vomiting.  Patient with [name] [name] hypertension with poor control despite multiple antihypertensive medications, although history of medication noncompliance.  Patient reports elevated blood pressure over the past 2 weeks.  Patient currently on a holiday schedule for HD, last HD Sunday.  Was supposed to do HD on Tuesday but was directed to the ED due to elevated blood pressure.  In the ED, BP 245/112.  CT head without acute changes.  Chest x-ray with cardiomegaly.  Critical care medicine was consulted and recommend admission to hospital service.  Hospitalist service consulted for further evaluation and management.   Assessment & Plan:   Principal Problem:   Hypertensive emergency Active Problems:   DM (diabetes mellitus), type 2, uncontrolled ([name] [name])   Diabetic neuropathy ([name] [name])   Essential hypertension   Diabetes mellitus with complication ([place])   ESRD (end stage renal disease) ([name] [name])   Hypertensive emergency History of essential/malignant HTN Patient presenting to the ED with 2-week history of poorly controlled hypertension associated with headache, visual changes and nausea/vomiting.  Blood pressure on arrival 245/112.  CT head with no acute intracranial abnormality.  MR brain unrevealing.  Initially started on Cleviprex drip which was weaned off.  PCCM consulted, no further recommendations; no need for ICU admission. --Troponin 17, normal --BP 179/82 this morning --TTE: Pending --Amlodipine 10 mg p.o. daily --Carvedilol 25 mg p.o. twice daily --Isosorbide dinitrate 20 mg TID --Increase hydralazine from 10 mg to 25 mg PO  q8h --Continue to monitor blood pressure closely, continue to uptitrate hydralazine as needed  ESRD on HD MWF Patient dialyzes via left chest permacath.  Currently undergoes HD on M WF schedule at [facility]. --Nephrology consulted for continued HD while inpatient  Type 2 diabetes mellitus Home regimen includes Lantus 10 units subcutaneously daily, Humalog insulin sliding scale.  Hemoglobin A1c 5.8 on 03/09/2020, well controlled. --Lantus 5 units subcutaneously daily --Very sensitive insulin sliding scale for further coverage --CBGs before every meal/at bedtime --Continue monitor and adjust accordingly  Peripheral neuropathy --Gabapentin 100mg  daily and 200mg  qHS  Multiple falls, weakness, debility, deconditioning Patient recently discharged from [facility] 03/15/2020 to [facility], SNF, discharge less than 2 weeks.  Poor support at home with husband.  Continues with multiple falls at home due to weakness and requires assistance for all ADLs.  Unable to manage at home despite home health services. --PT/OT evaluation --TOC for likely need of SNF placement  DVT prophylaxis: Heparin Code Status: Full code Family Communication: None present at bedside  Disposition Plan:  Status is: Observation  The patient remains OBS appropriate and will d/c before 2 midnights.  Dispo: The patient is from: Home              Anticipated d/c is to: SNF              Anticipated d/c date is: 2 days              Patient currently is not medically stable to d/c.   Consultants:   Nephrology  Procedures:   None  Antimicrobials:   None   Subjective: Patient seen and examined bedside, resting comfortably.  Complaining of  headache; slightly improved since admission.  No other complaints or concerns at this time.  States is allergic to tramadol and Tylenol.  Denies fever/chills/night sweats, no nausea/vomiting/diarrhea, no chest pain, palpitations, no shortness of breath, no abdominal pain.   No acute events overnight per nursing staff.  Objective: Vitals:   05/28/20 0346 05/28/20 0542 05/28/20 0805 05/28/20 1138  BP: (!) 190/84 (!) 179/82 (!) 180/81 (!) 150/68  Pulse: 66 70 70 70  Resp: 19 18 19 20   Temp: 97.9 F (36.6 C) 97.9 F (36.6 C) 98.3 F (36.8 C) 97.8 F (36.6 C)  TempSrc: Oral Oral Oral Oral  SpO2: 100% 100% 100% 100%    Intake/Output Summary (Last 24 hours) at 05/28/2020 1333 Last data filed at 05/28/2020 1302 Gross per 24 hour  Intake 480 ml  Output --  Net 480 ml   There were no vitals filed for this visit.  Examination:  General exam: Appears calm and comfortable, appears slightly older than stated age Respiratory system: Clear to auscultation. Respiratory effort normal.  Oxygen well on room air Cardiovascular system: S1 & S2 heard, RRR. No JVD, murmurs, rubs, gallops or clicks. No pedal edema.  Noted left chest HD permacath with dressing in place Gastrointestinal system: Abdomen is nondistended, soft and nontender. No organomegaly or masses felt. Normal bowel sounds heard. Central nervous system: Alert and oriented. No focal neurological deficits. Extremities: Symmetric 5 x 5 power. Skin: No rashes, lesions or ulcers Psychiatry: Judgement and insight appear poor. Mood & affect appropriate.     Data Reviewed: I have personally reviewed following labs and imaging studies  CBC: Recent Labs  Lab 05/27/20 1429 05/28/20 0230  WBC 7.3 9.4  HGB 12.1 11.4*  HCT 39.8 36.3  MCV 86.9 84.0  PLT 150 643*   Basic Metabolic Panel: Recent Labs  Lab 05/27/20 1429 05/28/20 0230  NA 139 140  K 4.2 4.1  CL 101 103  CO2 23 23  GLUCOSE 261* 150*  BUN 55* 60*  CREATININE 5.54* 6.28*  CALCIUM 8.9 8.7*   GFR: CrCl cannot be calculated (Unknown ideal weight.). Liver Function Tests: Recent Labs  Lab 05/28/20 0230  AST 20  ALT 24  ALKPHOS 88  BILITOT 0.6  PROT 6.6  ALBUMIN 3.4*   No results for input(s): LIPASE, AMYLASE in the last 168  hours. No results for input(s): AMMONIA in the last 168 hours. Coagulation Profile: No results for input(s): INR, PROTIME in the last 168 hours. Cardiac Enzymes: No results for input(s): CKTOTAL, CKMB, CKMBINDEX, TROPONINI in the last 168 hours. BNP (last 3 results) No results for input(s): PROBNP in the last 8760 hours. HbA1C: No results for input(s): HGBA1C in the last 72 hours. CBG: Recent Labs  Lab 05/28/20 0221 05/28/20 0808 05/28/20 1137  GLUCAP 124* 106* 150*   Lipid Profile: No results for input(s): CHOL, HDL, LDLCALC, TRIG, CHOLHDL, LDLDIRECT in the last 72 hours. Thyroid Function Tests: No results for input(s): TSH, T4TOTAL, FREET4, T3FREE, THYROIDAB in the last 72 hours. Anemia Panel: No results for input(s): VITAMINB12, FOLATE, FERRITIN, TIBC, IRON, RETICCTPCT in the last 72 hours. Sepsis Labs: No results for input(s): PROCALCITON, LATICACIDVEN in the last 168 hours.  Recent Results (from the past 240 hour(s))  Respiratory Panel by RT PCR (Flu A&B, Covid) - Nasopharyngeal Swab     Status: None   Collection Time: 05/27/20 10:13 PM   Specimen: Nasopharyngeal Swab; Nasopharyngeal(NP) swabs in vial transport medium  Result Value Ref Range Status   SARS Coronavirus  2 by RT PCR NEGATIVE NEGATIVE Final    Comment: (NOTE) SARS-CoV-2 target nucleic acids are NOT DETECTED.  The SARS-CoV-2 RNA is generally detectable in upper respiratoy specimens during the acute phase of infection. The lowest concentration of SARS-CoV-2 viral copies this assay can detect is 131 copies/mL. A negative result does not preclude SARS-Cov-2 infection and should not be used as the sole basis for treatment or other patient management decisions. A negative result may occur with  improper specimen collection/handling, submission of specimen other than nasopharyngeal swab, presence of viral mutation(s) within the areas targeted by this assay, and inadequate number of viral copies (<131 copies/mL).  A negative result must be combined with clinical observations, patient history, and epidemiological information. The expected result is Negative.  Fact Sheet for Patients:  PinkCheek.be  Fact Sheet for Healthcare Providers:  GravelBags.it  This test is no t yet approved or cleared by the [place] FDA and  has been authorized for detection and/or diagnosis of SARS-CoV-2 by FDA under an Emergency Use Authorization (EUA). This EUA will remain  in effect (meaning this test can be used) for the duration of the COVID-19 declaration under Section 564(b)(1) of the Act, 21 U.S.C. section 360bbb-3(b)(1), unless the authorization is terminated or revoked sooner.     Influenza A by PCR NEGATIVE NEGATIVE Final   Influenza B by PCR NEGATIVE NEGATIVE Final    Comment: (NOTE) The Xpert Xpress SARS-CoV-2/FLU/RSV assay is intended as an aid in  the diagnosis of influenza from Nasopharyngeal swab specimens and  should not be used as a sole basis for treatment. Nasal washings and  aspirates are unacceptable for Xpert Xpress SARS-CoV-2/FLU/RSV  testing.  Fact Sheet for Patients: PinkCheek.be  Fact Sheet for Healthcare Providers: GravelBags.it  This test is not yet approved or cleared by the [place] FDA and  has been authorized for detection and/or diagnosis of SARS-CoV-2 by  FDA under an Emergency Use Authorization (EUA). This EUA will remain  in effect (meaning this test can be used) for the duration of the  Covid-19 declaration under Section 564(b)(1) of the Act, 21  U.S.C. section 360bbb-3(b)(1), unless the authorization is  terminated or revoked. Performed at [facility], [place] [address] [place], [place] [address]          Radiology Studies: CT Head Wo Contrast  Result Date: 05/27/2020 CLINICAL DATA:  Headache, intracranial hemorrhage suspected.  Hypertension. EXAM: CT HEAD WITHOUT CONTRAST TECHNIQUE: Contiguous axial images were obtained from the base of the skull through the vertex without intravenous contrast. COMPARISON:  CT head 04/29/2020. FINDINGS: Brain: No evidence of large-territorial acute infarction. No parenchymal hemorrhage. No mass lesion. No extra-axial collection. No mass effect or midline shift. No hydrocephalus. Basilar cisterns are patent. Vascular: No hyperdense vessel. Atherosclerotic calcifications are present within the cavernous internal carotid arteries. Skull: No acute fracture or focal lesion. Sinuses/Orbits: Right sphenoid sinus mucosal thickening. Otherwise paranasal sinuses and mastoid air cells are clear. The orbits are unremarkable. Other: None. IMPRESSION: No acute intracranial abnormality. Electronically Signed   By: Iven Finn M.D.   On: 05/27/2020 18:57   MR BRAIN WO CONTRAST  Result Date: 05/28/2020 CLINICAL DATA:  Blurry vision and nausea EXAM: MRI HEAD WITHOUT CONTRAST TECHNIQUE: Multiplanar, multiecho pulse sequences of the brain and surrounding structures were obtained without intravenous contrast. COMPARISON:  Brain MRI 06/04/2019 FINDINGS: Brain: No acute infarct, acute hemorrhage or extra-axial collection. Normal white matter signal. Normal volume of CSF spaces. No chronic microhemorrhage. Normal midline  structures. Vascular: Major flow voids are preserved. Skull and upper cervical spine: Normal calvarium and skull base. Visualized upper cervical spine and soft tissues are normal. Sinuses/Orbits:No paranasal sinus fluid levels or advanced mucosal thickening. No mastoid or middle ear effusion. Normal orbits. IMPRESSION: Normal brain MRI. Electronically Signed   By: Ulyses Jarred M.D.   On: 05/28/2020 01:07   DG CHEST PORT 1 VIEW  Result Date: 05/27/2020 CLINICAL DATA:  Headache, hypertension, blurred vision, and nausea for the past week. EXAM: PORTABLE CHEST 1 VIEW COMPARISON:  03/07/2020 FINDINGS:  Dialysis catheter with tip over the cavoatrial junction region. Mild cardiac enlargement. No vascular congestion, edema, or consolidation. No pleural effusions. No pneumothorax. Mediastinal contours appear intact. Surgical clips in the right upper quadrant. IMPRESSION: Mild cardiac enlargement. Electronically Signed   By: Lucienne Capers M.D.   On: 05/27/2020 23:48        Scheduled Meds: . acidophilus  1 capsule Oral Daily  . amLODipine  10 mg Oral Daily  . atropine  1 drop Right Eye TID  . carvedilol  25 mg Oral BID WC  . Chlorhexidine Gluconate Cloth  6 each Topical Q0600  . Chlorhexidine Gluconate Cloth  6 each Topical Q0600  . gabapentin  100 mg Oral Daily  . gabapentin  200 mg Oral QHS  . heparin  5,000 Units Subcutaneous Q8H  . hydrALAZINE  25 mg Oral Q8H  . insulin aspart  0-6 Units Subcutaneous TID WC  . insulin glargine  5 Units Subcutaneous QHS  . isosorbide dinitrate  20 mg Oral TID  . magnesium oxide  400 mg Oral BID  . sodium chloride flush  3 mL Intravenous Q12H   Continuous Infusions:   LOS: 0 days    Time spent: 39 minutes spent on chart review, discussion with nursing staff, consultants, updating family and interview/physical exam; more than 50% of that time was spent in counseling and/or coordination of care.    Parish Dubose J [place] DO [facility] Available via Epic secure chat 7am-7pm After these hours, please refer to coverage provider listed on [email or web address] 05/28/2020, 1:33 PM

## 2020-05-29 ENCOUNTER — Inpatient Hospital Stay (HOSPITAL_COMMUNITY): Payer: Medicare Other

## 2020-05-29 DIAGNOSIS — I161 Hypertensive emergency: Secondary | ICD-10-CM

## 2020-05-29 LAB — ECHOCARDIOGRAM COMPLETE
Area-P 1/2: 3.12 cm2
S' Lateral: 2.6 cm
Weight: 2550.28 oz

## 2020-05-29 LAB — GLUCOSE, CAPILLARY
Glucose-Capillary: 173 mg/dL — ABNORMAL HIGH (ref 70–99)
Glucose-Capillary: 213 mg/dL — ABNORMAL HIGH (ref 70–99)
Glucose-Capillary: 141 mg/dL — ABNORMAL HIGH (ref 70–99)
Glucose-Capillary: 207 mg/dL — ABNORMAL HIGH (ref 70–99)
Glucose-Capillary: 223 mg/dL — ABNORMAL HIGH (ref 70–99)
Glucose-Capillary: 171 mg/dL — ABNORMAL HIGH (ref 70–99)

## 2020-05-29 MED ORDER — HYDRALAZINE HCL 50 MG PO TABS
75.0000 mg | ORAL_TABLET | Freq: Three times a day (TID) | ORAL | Status: DC
  Administered 2020-05-29 – 2020-06-03 (×14): 75 mg via ORAL
  Filled 2020-05-29 (×14): qty 1

## 2020-05-29 MED ORDER — HYDRALAZINE HCL 50 MG PO TABS
50.0000 mg | ORAL_TABLET | Freq: Three times a day (TID) | ORAL | Status: DC
  Administered 2020-05-29: 50 mg via ORAL
  Filled 2020-05-29: qty 1

## 2020-05-29 MED ORDER — HYDRALAZINE HCL 25 MG PO TABS
25.0000 mg | ORAL_TABLET | Freq: Once | ORAL | Status: AC
  Administered 2020-05-29: 25 mg via ORAL
  Filled 2020-05-29: qty 1

## 2020-05-29 MED ORDER — CHLORHEXIDINE GLUCONATE CLOTH 2 % EX PADS
6.0000 | MEDICATED_PAD | Freq: Every day | CUTANEOUS | Status: DC
  Administered 2020-05-30 – 2020-06-03 (×4): 6 via TOPICAL

## 2020-05-29 MED ORDER — ONDANSETRON HCL 4 MG/2ML IJ SOLN
4.0000 mg | Freq: Four times a day (QID) | INTRAMUSCULAR | Status: DC | PRN
  Administered 2020-05-29 – 2020-06-02 (×2): 4 mg via INTRAVENOUS
  Filled 2020-05-29 (×2): qty 2

## 2020-05-29 NOTE — Progress Notes (Signed)
PROGRESS NOTE    [name] [name]  SJG:[id number] DOB: 10-May-1966 DOA: 05/27/2020 PCP: Myrtis Hopping., MD    Brief Narrative:  [name] [name] is a 54 year old female with past medical history notable for ESRD on HD MWF, hyperlipidemia, type 2 diabetes mellitus, GERD, essential hypertension, neuropathy who presented to the ED with headache, blurred vision and nausea with vomiting.  Patient with [name] [name] hypertension with poor control despite multiple antihypertensive medications, although history of medication noncompliance.  Patient reports elevated blood pressure over the past 2 weeks.  Patient currently on a holiday schedule for HD, last HD Sunday.  Was supposed to do HD on Tuesday but was directed to the ED due to elevated blood pressure.  In the ED, BP 245/112.  CT head without acute changes.  Chest x-ray with cardiomegaly.  Critical care medicine was consulted and recommend admission to hospital service.  Hospitalist service consulted for further evaluation and management.   Assessment & Plan:   Principal Problem:   Hypertensive emergency Active Problems:   DM (diabetes mellitus), type 2, uncontrolled ([place])   Diabetic neuropathy ([place])   Essential hypertension   Diabetes mellitus with complication ([place])   ESRD (end stage renal disease) ([place])   Hypertensive emergency History of essential/malignant HTN Patient presenting to the ED with 2-week history of poorly controlled hypertension associated with headache, visual changes and nausea/vomiting.  Blood pressure on arrival 245/112.  CT head with no acute intracranial abnormality.  MR brain unrevealing.  Initially started on Cleviprex drip which was weaned off.  PCCM consulted, no further recommendations; no need for ICU admission. --Troponin 17, normal --BP 162/81 this morning --TTE: Pending --Amlodipine 10 mg p.o. daily --Carvedilol 25 mg p.o. twice daily --Isosorbide dinitrate 20 mg TID --Increase hydralazine from 25 mg to 50 mg PO  q8h --Continue to monitor blood pressure closely, continue to uptitrate hydralazine as needed  ESRD on HD MWF Patient dialyzes via left chest permacath.  Currently undergoes HD on M WF schedule at [facility]. --Nephrology consulted for continued HD while inpatient  Type 2 diabetes mellitus Home regimen includes Lantus 10 units subcutaneously daily, Humalog insulin sliding scale.  Hemoglobin A1c 5.8 on 03/09/2020, well controlled. --Lantus 5 units subcutaneously daily --Very sensitive insulin sliding scale for further coverage --CBGs before every meal/at bedtime --Continue monitor and adjust accordingly  Peripheral neuropathy --Gabapentin 100mg  daily and 200mg  qHS  Multiple falls, weakness, debility, deconditioning Patient recently discharged from [facility] 03/15/2020 to [facility], SNF, discharge less than 2 weeks.  Poor support at home with husband.  Continues with multiple falls at home due to weakness and requires assistance for all ADLs.  Unable to manage at home despite home health services. --PT recommends HHPT, awaiting OT eval --concerns about home living environment; TOC consultation  DVT prophylaxis: Heparin Code Status: Full code Family Communication: None present at bedside  Disposition Plan:  Status is: Observation  The patient remains OBS appropriate and will d/c before 2 midnights.  Dispo: The patient is from: Home              Anticipated d/c is to: SNF              Anticipated d/c date is: 2 days              Patient currently is not medically stable to d/c.   Consultants:   Nephrology  Procedures:   None  Antimicrobials:   None   Subjective: Patient seen and examined bedside, resting  comfortably.  Continues with mild headache, although improved.  Blood pressure also improved.  Planes of nausea as well this morning.  No other complaints or concerns at this time.  Denies fever/chills/night sweats, no vomiting/diarrhea, no chest pain,  palpitations, no shortness of breath, no abdominal pain.  No acute events overnight per nursing staff.  Objective: Vitals:   05/28/20 2031 05/29/20 0600 05/29/20 0814 05/29/20 1100  BP: (!) 156/79 (!) 162/81 (!) 150/74 134/78  Pulse: 71 66 64 63  Resp: 20 20 18 18   Temp: 97.8 F (36.6 C) 98.3 F (36.8 C) 97.8 F (36.6 C)   TempSrc: Oral Oral Oral Oral  SpO2: 100%     Weight:  72.3 kg      Intake/Output Summary (Last 24 hours) at 05/29/2020 1256 Last data filed at 05/29/2020 2229 Gross per 24 hour  Intake 769.4 ml  Output 650 ml  Net 119.4 ml   Filed Weights   05/28/20 1326 05/28/20 1625 05/29/20 0600  Weight: 67.3 kg 66.5 kg 72.3 kg    Examination:  General exam: Appears calm and comfortable, appears slightly older than stated age Respiratory system: Clear to auscultation. Respiratory effort normal.  Oxygen well on room air Cardiovascular system: S1 & S2 heard, RRR. No JVD, murmurs, rubs, gallops or clicks. No pedal edema.  Noted left chest HD permacath with dressing in place Gastrointestinal system: Abdomen is nondistended, soft and nontender. No organomegaly or masses felt. Normal bowel sounds heard. Central nervous system: Alert and oriented. No focal neurological deficits. Extremities: Symmetric 5 x 5 power. Skin: No rashes, lesions or ulcers Psychiatry: Judgement and insight appear poor. Mood & affect appropriate.     Data Reviewed: I have personally reviewed following labs and imaging studies  CBC: Recent Labs  Lab 05/27/20 1429 05/28/20 0230  WBC 7.3 9.4  HGB 12.1 11.4*  HCT 39.8 36.3  MCV 86.9 84.0  PLT 150 798*   Basic Metabolic Panel: Recent Labs  Lab 05/27/20 1429 05/28/20 0230  NA 139 140  K 4.2 4.1  CL 101 103  CO2 23 23  GLUCOSE 261* 150*  BUN 55* 60*  CREATININE 5.54* 6.28*  CALCIUM 8.9 8.7*   GFR: Estimated Creatinine Clearance: 9.2 mL/min (A) (by C-G formula based on SCr of 6.28 mg/dL (H)). Liver Function Tests: Recent Labs   Lab 05/28/20 0230  AST 20  ALT 24  ALKPHOS 88  BILITOT 0.6  PROT 6.6  ALBUMIN 3.4*   No results for input(s): LIPASE, AMYLASE in the last 168 hours. No results for input(s): AMMONIA in the last 168 hours. Coagulation Profile: No results for input(s): INR, PROTIME in the last 168 hours. Cardiac Enzymes: No results for input(s): CKTOTAL, CKMB, CKMBINDEX, TROPONINI in the last 168 hours. BNP (last 3 results) No results for input(s): PROBNP in the last 8760 hours. HbA1C: No results for input(s): HGBA1C in the last 72 hours. CBG: Recent Labs  Lab 05/28/20 1821 05/28/20 2116 05/29/20 0819 05/29/20 0936 05/29/20 1143  GLUCAP 148* 214* 173* 213* 141*   Lipid Profile: No results for input(s): CHOL, HDL, LDLCALC, TRIG, CHOLHDL, LDLDIRECT in the last 72 hours. Thyroid Function Tests: No results for input(s): TSH, T4TOTAL, FREET4, T3FREE, THYROIDAB in the last 72 hours. Anemia Panel: No results for input(s): VITAMINB12, FOLATE, FERRITIN, TIBC, IRON, RETICCTPCT in the last 72 hours. Sepsis Labs: No results for input(s): PROCALCITON, LATICACIDVEN in the last 168 hours.  Recent Results (from the past 240 hour(s))  Respiratory Panel by RT PCR (Flu  A&B, Covid) - Nasopharyngeal Swab     Status: None   Collection Time: 05/27/20 10:13 PM   Specimen: Nasopharyngeal Swab; Nasopharyngeal(NP) swabs in vial transport medium  Result Value Ref Range Status   SARS Coronavirus 2 by RT PCR NEGATIVE NEGATIVE Final    Comment: (NOTE) SARS-CoV-2 target nucleic acids are NOT DETECTED.  The SARS-CoV-2 RNA is generally detectable in upper respiratoy specimens during the acute phase of infection. The lowest concentration of SARS-CoV-2 viral copies this assay can detect is 131 copies/mL. A negative result does not preclude SARS-Cov-2 infection and should not be used as the sole basis for treatment or other patient management decisions. A negative result may occur with  improper specimen  collection/handling, submission of specimen other than nasopharyngeal swab, presence of viral mutation(s) within the areas targeted by this assay, and inadequate number of viral copies (<131 copies/mL). A negative result must be combined with clinical observations, patient history, and epidemiological information. The expected result is Negative.  Fact Sheet for Patients:  PinkCheek.be  Fact Sheet for Healthcare Providers:  GravelBags.it  This test is no t yet approved or cleared by the [place] FDA and  has been authorized for detection and/or diagnosis of SARS-CoV-2 by FDA under an Emergency Use Authorization (EUA). This EUA will remain  in effect (meaning this test can be used) for the duration of the COVID-19 declaration under Section 564(b)(1) of the Act, 21 U.S.C. section 360bbb-3(b)(1), unless the authorization is terminated or revoked sooner.     Influenza A by PCR NEGATIVE NEGATIVE Final   Influenza B by PCR NEGATIVE NEGATIVE Final    Comment: (NOTE) The Xpert Xpress SARS-CoV-2/FLU/RSV assay is intended as an aid in  the diagnosis of influenza from Nasopharyngeal swab specimens and  should not be used as a sole basis for treatment. Nasal washings and  aspirates are unacceptable for Xpert Xpress SARS-CoV-2/FLU/RSV  testing.  Fact Sheet for Patients: PinkCheek.be  Fact Sheet for Healthcare Providers: GravelBags.it  This test is not yet approved or cleared by the [place] FDA and  has been authorized for detection and/or diagnosis of SARS-CoV-2 by  FDA under an Emergency Use Authorization (EUA). This EUA will remain  in effect (meaning this test can be used) for the duration of the  Covid-19 declaration under Section 564(b)(1) of the Act, 21  U.S.C. section 360bbb-3(b)(1), unless the authorization is  terminated or revoked. Performed at [facility], [place] [address], [place],  [address]          Radiology Studies: CT Head Wo Contrast  Result Date: 05/27/2020 CLINICAL DATA:  Headache, intracranial hemorrhage suspected. Hypertension. EXAM: CT HEAD WITHOUT CONTRAST TECHNIQUE: Contiguous axial images were obtained from the base of the skull through the vertex without intravenous contrast. COMPARISON:  CT head 04/29/2020. FINDINGS: Brain: No evidence of large-territorial acute infarction. No parenchymal hemorrhage. No mass lesion. No extra-axial collection. No mass effect or midline shift. No hydrocephalus. Basilar cisterns are patent. Vascular: No hyperdense vessel. Atherosclerotic calcifications are present within the cavernous internal carotid arteries. Skull: No acute fracture or focal lesion. Sinuses/Orbits: Right sphenoid sinus mucosal thickening. Otherwise paranasal sinuses and mastoid air cells are clear. The orbits are unremarkable. Other: None. IMPRESSION: No acute intracranial abnormality. Electronically Signed   By: Iven Finn M.D.   On: 05/27/2020 18:57   MR BRAIN WO CONTRAST  Result Date: 05/28/2020 CLINICAL DATA:  Blurry vision and nausea EXAM: MRI HEAD WITHOUT CONTRAST TECHNIQUE: Multiplanar, multiecho pulse sequences of  the brain and surrounding structures were obtained without intravenous contrast. COMPARISON:  Brain MRI 06/04/2019 FINDINGS: Brain: No acute infarct, acute hemorrhage or extra-axial collection. Normal white matter signal. Normal volume of CSF spaces. No chronic microhemorrhage. Normal midline structures. Vascular: Major flow voids are preserved. Skull and upper cervical spine: Normal calvarium and skull base. Visualized upper cervical spine and soft tissues are normal. Sinuses/Orbits:No paranasal sinus fluid levels or advanced mucosal thickening. No mastoid or middle ear effusion. Normal orbits. IMPRESSION: Normal brain MRI. Electronically Signed   By: Ulyses Jarred M.D.   On: 05/28/2020  01:07   DG CHEST PORT 1 VIEW  Result Date: 05/27/2020 CLINICAL DATA:  Headache, hypertension, blurred vision, and nausea for the past week. EXAM: PORTABLE CHEST 1 VIEW COMPARISON:  03/07/2020 FINDINGS: Dialysis catheter with tip over the cavoatrial junction region. Mild cardiac enlargement. No vascular congestion, edema, or consolidation. No pleural effusions. No pneumothorax. Mediastinal contours appear intact. Surgical clips in the right upper quadrant. IMPRESSION: Mild cardiac enlargement. Electronically Signed   By: Lucienne Capers M.D.   On: 05/27/2020 23:48        Scheduled Meds: . acidophilus  1 capsule Oral Daily  . amLODipine  10 mg Oral Daily  . atropine  1 drop Right Eye TID  . carvedilol  25 mg Oral BID WC  . [START ON 05/30/2020] Chlorhexidine Gluconate Cloth  6 each Topical Q0600  . gabapentin  100 mg Oral Daily  . gabapentin  200 mg Oral QHS  . heparin  5,000 Units Subcutaneous Q8H  . hydrALAZINE  50 mg Oral Q8H  . insulin aspart  0-6 Units Subcutaneous TID WC  . insulin glargine  5 Units Subcutaneous QHS  . isosorbide dinitrate  20 mg Oral TID  . magnesium oxide  400 mg Oral BID  . sodium chloride flush  3 mL Intravenous Q12H   Continuous Infusions:   LOS: 1 day    Time spent: 36 minutes spent on chart review, discussion with nursing staff, consultants, updating family and interview/physical exam; more than 50% of that time was spent in counseling and/or coordination of care.    [name] [name] [place] DO [facility] Available via Epic secure chat 7am-7pm After these hours, please refer to coverage provider listed on [email or web address] 05/29/2020, 12:56 PM
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
## 2020-05-29 NOTE — Progress Notes (Signed)
Echocardiogram 2D Echocardiogram has been performed.  [name] [name] 05/29/2020, 8:13 AM

## 2020-05-29 NOTE — Progress Notes (Signed)
[facility] Progress Note  Name: [name] [name] MRN: [id number] DOB: 04/06/1966  Chief Complaint:  Elevated BP at HD  Subjective:  Had HD on 11/24 with 650 mL UF.  Goal had been 3-3.5 to challenge her EDW given marked HTN and prolonged nausea/presumed weight loss; she signed off early AMA.  She states she feels she still has fluid on - no cramping during HD just had a headache and nausea.  States was told that she might go home tomorrow   Review of systems:  Denies shortness of breath Reports nausea with vomiting  Denies chest pain   ---------- Background on consult:  [name] [name] is a 54 y.o. female with history of ESRD, hypertension, diabetes, and GERD who presented to the hospital from her dialysis unit for hypertension on 11/23.  The patient states that she has had a headache and nausea for the past couple of weeks.  No trouble with chest pain or shortness of breath.  She tells me that she has had trouble keeping down her blood pressure medication.  She is not able to confirm her medications for me and states that we have a list.  Spoke with the charge RN at [facility] to get additional history and orders below.  She was previously in a SNF and was discharged home; they state BP was better at her SNF.  She has had HTN for the past couple of weeks and her BP 222/107 and 247/121.  She had reported taking her meds to the HD unit and was sent to the ER from her HD unit.  She had 1.5 kg on 11/23 when she came for the holiday schedule.    Intake/Output Summary (Last 24 hours) at 05/29/2020 1123 Last data filed at 05/29/2020 8032 Gross per 24 hour  Intake 769.4 ml  Output 650 ml  Net 119.4 ml    Vitals:  Vitals:   05/28/20 1736 05/28/20 2031 05/29/20 0600 05/29/20 0814  BP: (!) 166/75 (!) 156/79 (!) 162/81 (!) 150/74  Pulse:  71 66 64  Resp:  20 20 18   Temp:  97.8 F (36.6 C) 98.3 F (36.8 C) 97.8 F (36.6 C)  TempSrc:  Oral Oral Oral  SpO2:  100%    Weight:    72.3 kg      Physical Exam:  General: Adult female in bed in no acute distress at rest HEENT: Normocephalic atraumatic Eyes: Extraocular movements intact sclera anicteric Neck: Neck supple trachea midline Heart: S1-S2 no rub appreciated Lungs: Clear to auscultation bilaterally normal work of breathing at rest Abdomen: Soft nontender nondistended Extremities: trace edema bilateral lower extremities Skin: No rash on extremities exposed Neuro: Alert and oriented x3 provides a history and follows command Psych normal mood and affect Dialysis Access left IJ tunnel catheter  Medications reviewed   Labs:  BMP Latest Ref Rng & Units 05/28/2020 05/27/2020 03/14/2020  Glucose 70 - 99 mg/dL 150(H) 261(H) 157(H)  BUN 6 - 20 mg/dL 60(H) 55(H) 44(H)  Creatinine 0.44 - 1.00 mg/dL 6.28(H) 5.54(H) 5.15(H)  Sodium 135 - 145 mmol/L 140 139 140  Potassium 3.5 - 5.1 mmol/L 4.1 4.2 4.5  Chloride 98 - 111 mmol/L 103 101 100  CO2 22 - 32 mmol/L 23 23 26   Calcium 8.9 - 10.3 mg/dL 8.7(L) 8.9 9.0   Outpatient HD admit:  [facility] BF 350 DF 700  EDW 65 kg  3.5 hours F180 2K/3.0 Ca Heparin with tx 5000 bolus and 500 hourly  Left  IJ Tunn Catheter Last tx on 11/21 with post weight of 65 kg  Assessment/Plan:   # End-stage renal disease - Normally Monday Wednesday Friday at [facility]  - Will need HD on 11/26 per her MWF schedule  - Would challenge her dry weight as she may have lost weight with nausea and dec PO intake  # Hypertensive Emergency - resolved now with HTN better controlled - Would continue current home regimen for now - BP is improved  - For planning, may need to consider addition of clonidine patch if nausea is a recurrent issue.  Would not do clonidine pills as it sounds like there have been issues possibly with compliance both because of nausea and other factors. Unsure if needs additional approval process?  # Headache  - Felt secondary in part to uncontrolled HTN  - Pain  control per primary team   # Anemia CKD  - Mild and no acute need for ESA  Disposition per primary team   Claudia Desanctis, MD 05/29/2020 11:39 AM

## 2020-05-30 DIAGNOSIS — I161 Hypertensive emergency: Secondary | ICD-10-CM | POA: Diagnosis not present

## 2020-05-30 LAB — HEPATITIS B SURFACE ANTIBODY,QUALITATIVE: Hep B S Ab: NONREACTIVE

## 2020-05-30 LAB — RENAL FUNCTION PANEL
Albumin: 2.8 g/dL — ABNORMAL LOW (ref 3.5–5.0)
Anion gap: 12 (ref 5–15)
BUN: 53 mg/dL — ABNORMAL HIGH (ref 6–20)
CO2: 23 mmol/L (ref 22–32)
Calcium: 8.5 mg/dL — ABNORMAL LOW (ref 8.9–10.3)
Chloride: 100 mmol/L (ref 98–111)
Creatinine, Ser: 5.75 mg/dL — ABNORMAL HIGH (ref 0.44–1.00)
GFR, Estimated: 8 mL/min — ABNORMAL LOW (ref >60)
Glucose, Bld: 160 mg/dL — ABNORMAL HIGH (ref 70–99)
Phosphorus: 4.4 mg/dL (ref 2.5–4.6)
Potassium: 4.7 mmol/L (ref 3.5–5.1)
Sodium: 135 mmol/L (ref 135–145)

## 2020-05-30 LAB — GLUCOSE, CAPILLARY
Glucose-Capillary: 131 mg/dL — ABNORMAL HIGH (ref 70–99)
Glucose-Capillary: 238 mg/dL — ABNORMAL HIGH (ref 70–99)
Glucose-Capillary: 161 mg/dL — ABNORMAL HIGH (ref 70–99)
Glucose-Capillary: 169 mg/dL — ABNORMAL HIGH (ref 70–99)

## 2020-05-30 LAB — HEPATITIS B SURFACE ANTIGEN: Hepatitis B Surface Ag: NONREACTIVE

## 2020-05-30 LAB — CBC
HCT: 32.7 % — ABNORMAL LOW (ref 36.0–46.0)
Hemoglobin: 10 g/dL — ABNORMAL LOW (ref 12.0–15.0)
MCH: 26.6 pg (ref 26.0–34.0)
MCHC: 30.6 g/dL (ref 30.0–36.0)
MCV: 87 fL (ref 80.0–100.0)
Platelets: 119 10*3/uL — ABNORMAL LOW (ref 150–400)
RBC: 3.76 MIL/uL — ABNORMAL LOW (ref 3.87–5.11)
RDW: 19.1 % — ABNORMAL HIGH (ref 11.5–15.5)
WBC: 7.4 10*3/uL (ref 4.0–10.5)
nRBC: 0 % (ref 0.0–0.2)

## 2020-05-30 LAB — MAGNESIUM: Magnesium: 2 mg/dL (ref 1.7–2.4)

## 2020-05-30 LAB — HEPATITIS B CORE ANTIBODY, TOTAL: Hep B Core Total Ab: NONREACTIVE

## 2020-05-30 MED ORDER — HEPARIN SODIUM (PORCINE) 1000 UNIT/ML IJ SOLN
INTRAMUSCULAR | Status: AC
  Administered 2020-05-30: 5000 [IU]
  Filled 2020-05-30: qty 4

## 2020-05-30 MED ORDER — OXYCODONE HCL 5 MG PO TABS
ORAL_TABLET | ORAL | Status: AC
  Filled 2020-05-30: qty 1

## 2020-05-30 NOTE — Progress Notes (Signed)
[facility] Progress Note  Name: [name] [name] MRN: [id number] DOB: Jun 07, 1966  Chief Complaint:  Elevated BP at HD  Subjective:  BP good. No complaints today. Undergoing dialysis.   Review of systems:  12 systems reviewed and negative except per subjective     Intake/Output Summary (Last 24 hours) at 05/30/2020 1032 Last data filed at 05/30/2020 0700 Gross per 24 hour  Intake 360 ml  Output 0 ml  Net 360 ml    Vitals:  Vitals:   05/30/20 0928 05/30/20 0930 05/30/20 1000 05/30/20 1015  BP: (!) 184/86 (!) 184/86 121/67 140/73  Pulse: 74 75 68 69  Resp: 14 12    Temp: 98.3 F (36.8 C)     TempSrc: Axillary     SpO2: 100%     Weight: 67.5 kg        Physical Exam:  General: Adult female in bed in no acute distress at rest HEENT: Normocephalic atraumatic Eyes: Extraocular movements intact sclera anicteric Heart: S1-S2 no rub appreciated Lungs:  Bilateral chest rise, no increased work of breathing Abdomen: Soft nontender nondistended Extremities: warm and well perfused, no edema Skin: No rash on extremities exposed Neuro: Alert and oriented x3 provides a history and follows command Psych normal mood and affect Dialysis Access left IJ tunnel catheter  Medications reviewed   Labs:  BMP Latest Ref Rng & Units 05/30/2020 05/28/2020 05/27/2020  Glucose 70 - 99 mg/dL 160(H) 150(H) 261(H)  BUN 6 - 20 mg/dL 53(H) 60(H) 55(H)  Creatinine 0.44 - 1.00 mg/dL 5.75(H) 6.28(H) 5.54(H)  Sodium 135 - 145 mmol/L 135 140 139  Potassium 3.5 - 5.1 mmol/L 4.7 4.1 4.2  Chloride 98 - 111 mmol/L 100 103 101  CO2 22 - 32 mmol/L 23 23 23   Calcium 8.9 - 10.3 mg/dL 8.5(L) 8.7(L) 8.9   Outpatient HD admit:  [facility] BF 350 DF 700  EDW 65 kg  3.5 hours F180 2K/3.0 Ca Heparin with tx 5000 bolus and 500 hourly  Left IJ Tunn Catheter Last tx on 11/21 with post weight of 65 kg  Assessment/Plan:   # End-stage renal disease - Normally Monday Wednesday Friday at  [facility]  -Hemodialysis today back on MWF schedule - Would challenge her dry weight as she may have lost weight with nausea and dec PO intake  # Hypertensive Emergency - resolved now with HTN better controlled - Would continue current home regimen for now - BP is improved  -Consider clonidine patch outpatient to improve compliance  # Headache  - Felt secondary in part to uncontrolled HTN  - Pain control per primary team   # Anemia CKD  - Mild and no acute need for ESA  Disposition per primary team   Reesa Chew, MD 05/30/2020 10:32 AM

## 2020-05-30 NOTE — TOC Progression Note (Signed)
Transition of Care [facility] - Progression Note    Patient Details  Name: NECOLE MINASSIAN MRN: [id number] Date of Birth: 1966/02/05  Transition of Care [facility]) CM/SW [place], [place] Phone Number: 05/30/2020, 4:35 PM  Clinical Narrative:     CSW spoke with pt at bedside.  CSW introduced self.  Pt asked CSW if anyone was in the hall.  CSW told pt no.  Pt asked if CSW could come back to speak with her later because pt's spouse should be here any minute.  CSW told pt someone will follow up on Saturday.  Pt was in agreement.         Expected Discharge Plan and Services                                                 Social Determinants of Health (SDOH) Interventions    Readmission Risk Interventions No flowsheet data found.

## 2020-05-30 NOTE — Progress Notes (Signed)
PROGRESS NOTE    [name] [name]  EXH:[id number] DOB: [birth year] DOA: 05/27/2020 PCP: Myrtis Hopping., MD    Brief Narrative:  [name] [name] is a 54 year old female with past medical history notable for ESRD on HD MWF, hyperlipidemia, type 2 diabetes mellitus, GERD, essential hypertension, neuropathy who presented to the ED with headache, blurred vision and nausea with vomiting.  Patient with [name] [name] hypertension with poor control despite multiple antihypertensive medications, although history of medication noncompliance.  Patient reports elevated blood pressure over the past 2 weeks.  Patient currently on a holiday schedule for HD, last HD Sunday.  Was supposed to do HD on Tuesday but was directed to the ED due to elevated blood pressure.  In the ED, BP 245/112.  CT head without acute changes.  Chest x-ray with cardiomegaly.  Critical care medicine was consulted and recommend admission to hospital service.  Hospitalist service consulted for further evaluation and management.   Assessment & Plan:   Principal Problem:   Hypertensive emergency Active Problems:   DM (diabetes mellitus), type 2, uncontrolled ([place])   Diabetic neuropathy ([place])   Essential hypertension   Diabetes mellitus with complication ([place])   ESRD (end stage renal disease) ([place])   Hypertensive emergency History of essential/malignant HTN Patient presenting to the ED with 2-week history of poorly controlled hypertension associated with headache, visual changes and nausea/vomiting.  Blood pressure on arrival 245/112.  CT head with no acute intracranial abnormality.  MR brain unrevealing.  Initially started on Cleviprex drip which was weaned off.  PCCM consulted, no further recommendations; no need for ICU admission. Troponin 17, normal. TTE with LVEF 38-10%, grade 2 diastolic dysfunction, trivial MR, IVC normal in size. --BP 1136/67 this morning --Amlodipine 10 mg p.o. daily --Carvedilol 25 mg p.o. twice daily --Isosorbide  dinitrate 20 mg TID --Hydralazine 75 mg PO q8h --Continue to monitor blood pressure closely, continue to uptitrate hydralazine as needed vs starting clonidine patch if needed  ESRD on HD MWF Patient dialyzes via left chest permacath.  Currently undergoes HD on M WF schedule at [facility]. --Nephrology consulted for continued HD while inpatient; for HD today  Type 2 diabetes mellitus Home regimen includes Lantus 10 units subcutaneously daily, Humalog insulin sliding scale.  Hemoglobin A1c 5.8 on 03/09/2020, well controlled. --Lantus 5 units subcutaneously daily --Very sensitive insulin sliding scale for further coverage --CBGs before every meal/at bedtime --Continue monitor and adjust accordingly  Peripheral neuropathy --Gabapentin 100mg  daily and 200mg  qHS  Multiple falls, weakness, debility, deconditioning Patient recently [facility], discharge less than 2 weeks.  Poor support at home with husband.  Continues with multiple falls at home due to weakness and requires assistance for all ADLs.  Unable to manage at home despite home health services. --PT/OT recommends HHPT,  --concerns about home living environment; [facility] consultation; but apparently patient has appointment on Thursday to obtain her own apartment  DVT prophylaxis: Heparin Code Status: Full code Family Communication: None present at bedside  Disposition Plan:  Status is: Observation  The patient remains OBS appropriate and will d/c before 2 midnights.  Dispo: The patient is from: Home              Anticipated d/c is to: SNF              Anticipated d/c date is: 2 days              Patient currently is not medically stable to  d/c.   Consultants:   Nephrology  Procedures:   None  Antimicrobials:   None   Subjective: Patient seen and examined bedside, resting comfortably.  Headache improved.  Concerned about her fluctuating blood pressure, although much  improved this morning.  For HD later this morning.  Patient states has an appointment on Thursday for apartment assistance due to her poor living environment.  No other complaints or concerns at this time.  Denies fever/chills/night sweats, no vomiting/diarrhea, no chest pain, palpitations, no shortness of breath, no abdominal pain.  No acute events overnight per nursing staff.  Objective: Vitals:   05/30/20 1015 05/30/20 1030 05/30/20 1100 05/30/20 1130  BP: 140/73 126/69 123/64 121/71  Pulse: 69 67 65 64  Resp:      Temp:      TempSrc:      SpO2:      Weight:        Intake/Output Summary (Last 24 hours) at 05/30/2020 1214 Last data filed at 05/30/2020 0700 Gross per 24 hour  Intake 360 ml  Output 0 ml  Net 360 ml   Filed Weights   05/29/20 0600 05/30/20 0700 05/30/20 0928  Weight: 72.3 kg 69 kg 67.5 kg    Examination:  General exam: Appears calm and comfortable, appears slightly older than stated age Respiratory system: Clear to auscultation. Respiratory effort normal.  Oxygen well on room air Cardiovascular system: S1 & S2 heard, RRR. No JVD, murmurs, rubs, gallops or clicks. No pedal edema.  Noted left chest HD permacath with dressing in place Gastrointestinal system: Abdomen is nondistended, soft and nontender. No organomegaly or masses felt. Normal bowel sounds heard. Central nervous system: Alert and oriented. No focal neurological deficits. Extremities: Symmetric 5 x 5 power. Skin: No rashes, lesions or ulcers Psychiatry: Judgement and insight appear poor. Mood & affect appropriate.     Data Reviewed: I have personally reviewed following labs and imaging studies  CBC: Recent Labs  Lab 05/27/20 1429 05/28/20 0230 05/30/20 1010  WBC 7.3 9.4 7.4  HGB 12.1 11.4* 10.0*  HCT 39.8 36.3 32.7*  MCV 86.9 84.0 87.0  PLT 150 142* 466*   Basic Metabolic Panel: Recent Labs  Lab 05/27/20 1429 05/28/20 0230 05/30/20 0233  NA 139 140 135  K 4.2 4.1 4.7  CL 101 103  100  CO2 23 23 23   GLUCOSE 261* 150* 160*  BUN 55* 60* 53*  CREATININE 5.54* 6.28* 5.75*  CALCIUM 8.9 8.7* 8.5*  MG  --   --  2.0  PHOS  --   --  4.4   GFR: Estimated Creatinine Clearance: 9.7 mL/min (A) (by C-G formula based on SCr of 5.75 mg/dL (H)). Liver Function Tests: Recent Labs  Lab 05/28/20 0230 05/30/20 0233  AST 20  --   ALT 24  --   ALKPHOS 88  --   BILITOT 0.6  --   PROT 6.6  --   ALBUMIN 3.4* 2.8*   No results for input(s): LIPASE, AMYLASE in the last 168 hours. No results for input(s): AMMONIA in the last 168 hours. Coagulation Profile: No results for input(s): INR, PROTIME in the last 168 hours. Cardiac Enzymes: No results for input(s): CKTOTAL, CKMB, CKMBINDEX, TROPONINI in the last 168 hours. BNP (last 3 results) No results for input(s): PROBNP in the last 8760 hours. HbA1C: No results for input(s): HGBA1C in the last 72 hours. CBG: Recent Labs  Lab 05/29/20 1143 05/29/20 1741 05/29/20 1829 05/29/20 2150 05/30/20 0859  GLUCAP 141* 207*  223* 171* 131*   Lipid Profile: No results for input(s): CHOL, HDL, LDLCALC, TRIG, CHOLHDL, LDLDIRECT in the last 72 hours. Thyroid Function Tests: No results for input(s): TSH, T4TOTAL, FREET4, T3FREE, THYROIDAB in the last 72 hours. Anemia Panel: No results for input(s): VITAMINB12, FOLATE, FERRITIN, TIBC, IRON, RETICCTPCT in the last 72 hours. Sepsis Labs: No results for input(s): PROCALCITON, LATICACIDVEN in the last 168 hours.  Recent Results (from the past 240 hour(s))  Respiratory Panel by RT PCR (Flu A&B, Covid) - Nasopharyngeal Swab     Status: None   Collection Time: 05/27/20 10:13 PM   Specimen: Nasopharyngeal Swab; Nasopharyngeal(NP) swabs in vial transport medium  Result Value Ref Range Status   SARS Coronavirus 2 by RT PCR NEGATIVE NEGATIVE Final    Comment: (NOTE) SARS-CoV-2 target nucleic acids are NOT DETECTED.  The SARS-CoV-2 RNA is generally detectable in upper respiratoy specimens  during the acute phase of infection. The lowest concentration of SARS-CoV-2 viral copies this assay can detect is 131 copies/mL. A negative result does not preclude SARS-Cov-2 infection and should not be used as the sole basis for treatment or other patient management decisions. A negative result may occur with  improper specimen collection/handling, submission of specimen other than nasopharyngeal swab, presence of viral mutation(s) within the areas targeted by this assay, and inadequate number of viral copies (<131 copies/mL). A negative result must be combined with clinical observations, patient history, and epidemiological information. The expected result is Negative.  Fact Sheet for Patients:  PinkCheek.be  Fact Sheet for Healthcare Providers:  GravelBags.it  This test is no t yet approved or cleared by the [place] FDA and  has been authorized for detection and/or diagnosis of SARS-CoV-2 by FDA under an Emergency Use Authorization (EUA). This EUA will remain  in effect (meaning this test can be used) for the duration of the COVID-19 declaration under Section 564(b)(1) of the Act, 21 U.S.C. section 360bbb-3(b)(1), unless the authorization is terminated or revoked sooner.     Influenza A by PCR NEGATIVE NEGATIVE Final   Influenza B by PCR NEGATIVE NEGATIVE Final    Comment: (NOTE) The Xpert Xpress SARS-CoV-2/FLU/RSV assay is intended as an aid in  the diagnosis of influenza from Nasopharyngeal swab specimens and  should not be used as a sole basis for treatment. Nasal washings and  aspirates are unacceptable for Xpert Xpress SARS-CoV-2/FLU/RSV  testing.  Fact Sheet for Patients: PinkCheek.be  Fact Sheet for Healthcare Providers: GravelBags.it  This test is not yet approved or cleared by the [place] FDA and  has been authorized for detection and/or  diagnosis of SARS-CoV-2 by  FDA under an Emergency Use Authorization (EUA). This EUA will remain  in effect (meaning this test can be used) for the duration of the  Covid-19 declaration under Section 564(b)(1) of the Act, 21  U.S.C. section 360bbb-3(b)(1), unless the authorization is  terminated or revoked. Performed at [facility], [place] [address] [place] [address], [place], [place] [address]          Radiology Studies: ECHOCARDIOGRAM COMPLETE  Result Date: 05/29/2020    ECHOCARDIOGRAM REPORT   Patient Name:   ZEPPELIN BECKSTRAND Date of Exam: 05/29/2020 Medical Rec #:  [id number]     Height:       60.0 in Accession #:    [id number]    Weight:       159.4 lb Date of Birth:  1965/09/18     BSA:  1.695 m Patient Age:    7 years      BP:           162/81 mmHg Patient Gender: F             HR:           64 bpm. Exam Location:  Inpatient Procedure: 2D Echo, Color Doppler and Cardiac Doppler Indications:    Hypertensive Emergency  History:        Patient has prior history of Echocardiogram examinations, most                 recent 07/29/2015. Risk Factors:Hypertension and Diabetes.  Sonographer:    Raquel Sarna Senior RDCS Referring Phys: [id number] [place]  1. Left ventricular ejection fraction, by estimation, is 60 to 65%. The left ventricle has normal function. The left ventricle has no regional wall motion abnormalities. There is moderate left ventricular hypertrophy. Left ventricular diastolic parameters are consistent with Grade II diastolic dysfunction (pseudonormalization). Elevated left atrial pressure.  2. Right ventricular systolic function is normal. The right ventricular size is normal. There is mildly elevated pulmonary artery systolic pressure.  3. The mitral valve is normal in structure. Trivial mitral valve regurgitation.  4. The aortic valve is tricuspid. Aortic valve regurgitation is not visualized. Mild aortic valve sclerosis is present, with no evidence of aortic valve stenosis.   5. The inferior vena cava is normal in size with <50% respiratory variability, suggesting right atrial pressure of 8 mmHg. FINDINGS  Left Ventricle: Left ventricular ejection fraction, by estimation, is 60 to 65%. The left ventricle has normal function. The left ventricle has no regional wall motion abnormalities. The left ventricular internal cavity size was small. There is moderate  left ventricular hypertrophy. Left ventricular diastolic parameters are consistent with Grade II diastolic dysfunction (pseudonormalization). Elevated left atrial pressure. Right Ventricle: The right ventricular size is normal. Right vetricular wall thickness was not assessed. Right ventricular systolic function is normal. There is mildly elevated pulmonary artery systolic pressure. The tricuspid regurgitant velocity is 2.78 m/s, and with an assumed right atrial pressure of 8 mmHg, the estimated right ventricular systolic pressure is 85.8 mmHg. Left Atrium: Left atrial size was normal in size. Right Atrium: Right atrial size was normal in size. Pericardium: There is no evidence of pericardial effusion. Mitral Valve: The mitral valve is normal in structure. Trivial mitral valve regurgitation. Tricuspid Valve: The tricuspid valve is normal in structure. Tricuspid valve regurgitation is trivial. Aortic Valve: The aortic valve is tricuspid. Aortic valve regurgitation is not visualized. Mild aortic valve sclerosis is present, with no evidence of aortic valve stenosis. Pulmonic Valve: The pulmonic valve was not well visualized. Pulmonic valve regurgitation is not visualized. Aorta: The aortic root is normal in size and structure. Venous: The inferior vena cava is normal in size with less than 50% respiratory variability, suggesting right atrial pressure of 8 mmHg. IAS/Shunts: The interatrial septum was not well visualized.  LEFT VENTRICLE PLAX 2D LVIDd:         3.50 cm  Diastology LVIDs:         2.60 cm  LV e' medial:    5.00 cm/s LV PW:          1.10 cm  LV E/e' medial:  15.8 LV IVS:        1.20 cm  LV e' lateral:   6.53 cm/s LVOT diam:     1.90 cm  LV E/e' lateral: 12.1 LV SV:  71 LV SV Index:   42 LVOT Area:     2.84 cm  RIGHT VENTRICLE RV S prime:     8.81 cm/s TAPSE (M-mode): 1.9 cm LEFT ATRIUM             Index       RIGHT ATRIUM           Index LA diam:        2.60 cm 1.53 cm/m  RA Area:     11.40 cm LA Vol (A2C):   39.8 ml 23.48 ml/m RA Volume:   23.00 ml  13.57 ml/m LA Vol (A4C):   41.6 ml 24.54 ml/m LA Biplane Vol: 42.2 ml 24.90 ml/m  AORTIC VALVE LVOT Vmax:   106.00 cm/s LVOT Vmean:  74.800 cm/s LVOT VTI:    0.250 m  AORTA Ao Root diam: 2.70 cm Ao Asc diam:  2.70 cm MITRAL VALVE               TRICUSPID VALVE MV Area (PHT): 3.12 cm    TR Peak grad:   30.9 mmHg MV Decel Time: 243 msec    TR Vmax:        278.00 cm/s MV E velocity: 78.80 cm/s MV A velocity: 94.30 cm/s  SHUNTS MV E/A ratio:  0.84        Systemic VTI:  0.25 m                            Systemic Diam: 1.90 cm Oswaldo Milian MD Electronically signed by Oswaldo Milian MD Signature Date/Time: 05/29/2020/1:23:32 PM    Final         Scheduled Meds: . acidophilus  1 capsule Oral Daily  . amLODipine  10 mg Oral Daily  . atropine  1 drop Right Eye TID  . carvedilol  25 mg Oral BID WC  . Chlorhexidine Gluconate Cloth  6 each Topical Q0600  . gabapentin  100 mg Oral Daily  . gabapentin  200 mg Oral QHS  . heparin  5,000 Units Subcutaneous Q8H  . hydrALAZINE  75 mg Oral Q8H  . insulin aspart  0-6 Units Subcutaneous TID WC  . insulin glargine  5 Units Subcutaneous QHS  . isosorbide dinitrate  20 mg Oral TID  . magnesium oxide  400 mg Oral BID  . sodium chloride flush  3 mL Intravenous Q12H   Continuous Infusions:   LOS: 2 days    Time spent: 36 minutes spent on chart review, discussion with nursing staff, consultants, updating family and interview/physical exam; more than 50% of that time was spent in counseling and/or coordination of  care.    Aika Brzoska J [place] DO [facility] Available via Epic secure chat 7am-7pm After these hours, please refer to coverage provider listed on [email or web address] 05/30/2020, 12:14 PM

## 2020-05-31 DIAGNOSIS — I161 Hypertensive emergency: Secondary | ICD-10-CM | POA: Diagnosis not present

## 2020-05-31 LAB — GLUCOSE, CAPILLARY
Glucose-Capillary: 169 mg/dL — ABNORMAL HIGH (ref 70–99)
Glucose-Capillary: 113 mg/dL — ABNORMAL HIGH (ref 70–99)
Glucose-Capillary: 109 mg/dL — ABNORMAL HIGH (ref 70–99)
Glucose-Capillary: 106 mg/dL — ABNORMAL HIGH (ref 70–99)

## 2020-05-31 MED ORDER — METHOCARBAMOL 500 MG PO TABS
500.0000 mg | ORAL_TABLET | Freq: Four times a day (QID) | ORAL | Status: DC | PRN
  Administered 2020-05-31 – 2020-06-03 (×5): 500 mg via ORAL
  Filled 2020-05-31 (×5): qty 1

## 2020-05-31 NOTE — Progress Notes (Signed)
[facility] Progress Note  Name: [name] [name] MRN: [id number] DOB: 09/04/1965  Chief Complaint:  Elevated BP at HD  Subjective:  Minimal complaints today.  Blood pressure is excellent.  Patient is trying to figure out her discharge plans.  She has an interview with an apartment next week.  Review of systems:  12 systems reviewed and negative except per subjective     Intake/Output Summary (Last 24 hours) at 05/31/2020 1010 Last data filed at 05/30/2020 1312 Gross per 24 hour  Intake --  Output 2000 ml  Net -2000 ml    Vitals:  Vitals:   05/30/20 2258 05/31/20 0640 05/31/20 0741 05/31/20 0833  BP: 120/63 128/65 (!) 125/59 122/66  Pulse:   67   Resp:      Temp:   98.2 F (36.8 C)   TempSrc:   Oral   SpO2:   99%   Weight:         Physical Exam:  General: Adult female in bed in no acute distress at rest HEENT: Normocephalic atraumatic Eyes: Extraocular movements intact sclera anicteric Heart: S1-S2 no rub appreciated Lungs:  Bilateral chest rise, no increased work of breathing Abdomen: Soft nontender nondistended Extremities: warm and well perfused, no edema Skin: No rash on extremities exposed Neuro: Alert and oriented x3 provides a history and follows command Psych normal mood and affect Dialysis Access left IJ tunnel catheter  Medications reviewed   Labs:  BMP Latest Ref Rng & Units 05/30/2020 05/28/2020 05/27/2020  Glucose 70 - 99 mg/dL 160(H) 150(H) 261(H)  BUN 6 - 20 mg/dL 53(H) 60(H) 55(H)  Creatinine 0.44 - 1.00 mg/dL 5.75(H) 6.28(H) 5.54(H)  Sodium 135 - 145 mmol/L 135 140 139  Potassium 3.5 - 5.1 mmol/L 4.7 4.1 4.2  Chloride 98 - 111 mmol/L 100 103 101  CO2 22 - 32 mmol/L 23 23 23   Calcium 8.9 - 10.3 mg/dL 8.5(L) 8.7(L) 8.9   Outpatient HD admit:  [facility] BF 350 DF 700  EDW 65 kg  3.5 hours F180 2K/3.0 Ca Heparin with tx 5000 bolus and 500 hourly  Left IJ Tunn Catheter Last tx on 11/21 with post weight of 65  kg  Assessment/Plan:   # End-stage renal disease - Normally Monday Wednesday Friday at [facility]  -Continue MWF schedule - Can continue to challenge dry weight as needed  # Hypertensive Emergency - resolved now with HTN better controlled - Would continue current home regimen for now - BP is improved  -Consider clonidine patch outpatient to improve compliance  # Headache: Improved - Felt secondary in part to uncontrolled HTN  - Pain control per primary team   # Anemia CKD  - Mild and no acute need for ESA  Disposition per primary team   Reesa Chew, MD 05/31/2020 10:10 AM

## 2020-05-31 NOTE — Progress Notes (Signed)
PROGRESS NOTE    [name] [name]  SEG:[id number] DOB: 09-08-1965 DOA: 05/27/2020 PCP: Myrtis Hopping., MD    Brief Narrative:  [name] [name] is a 54 year old female with past medical history notable for ESRD on HD MWF, hyperlipidemia, type 2 diabetes mellitus, GERD, essential hypertension, neuropathy who presented to the ED with headache, blurred vision and nausea with vomiting.  Patient with Gwyndolyn Saxon hypertension with poor control despite multiple antihypertensive medications, although history of medication noncompliance.  Patient reports elevated blood pressure over the past 2 weeks.  Patient currently on a holiday schedule for HD, last HD Sunday.  Was supposed to do HD on Tuesday but was directed to the ED due to elevated blood pressure.  In the ED, BP 245/112.  CT head without acute changes.  Chest x-ray with cardiomegaly.  Critical care medicine was consulted and recommend admission to hospital service.  Hospitalist service consulted for further evaluation and management.   Assessment & Plan:   Principal Problem:   Hypertensive emergency Active Problems:   DM (diabetes mellitus), type 2, uncontrolled ([place])   Diabetic neuropathy ([place])   Essential hypertension   Diabetes mellitus with complication ([place])   ESRD (end stage renal disease) ([place])   Hypertensive emergency History of essential/malignant HTN Patient presenting to the ED with 2-week history of poorly controlled hypertension associated with headache, visual changes and nausea/vomiting.  Blood pressure on arrival 245/112.  CT head with no acute intracranial abnormality.  MR brain unrevealing.  Initially started on Cleviprex drip which was weaned off.  PCCM consulted, no further recommendations; no need for ICU admission. Troponin 17, normal. TTE with LVEF 73-71%, grade 2 diastolic dysfunction, trivial MR, IVC normal in size. --BP 125/59 this morning --Amlodipine 10 mg p.o. daily --Carvedilol 25 mg p.o. twice daily --Isosorbide  dinitrate 20 mg TID --Hydralazine 75 mg PO q8h --Continue to monitor blood pressure closely, continue to uptitrate hydralazine as needed vs starting clonidine patch if needed  ESRD on HD MWF Patient dialyzes via left chest permacath.  Currently undergoes HD on MWF schedule at [facility]. --Nephrology consulted for continued HD while inpatient  Type 2 diabetes mellitus Home regimen includes Lantus 10 units subcutaneously daily, Humalog insulin sliding scale.  Hemoglobin A1c 5.8 on 03/09/2020, well controlled. --Lantus 5 units subcutaneously daily --Very sensitive insulin sliding scale for further coverage --CBGs before every meal/at bedtime --Continue monitor and adjust accordingly  Peripheral neuropathy --Gabapentin 100mg  daily and 200mg  qHS  Multiple falls, weakness, debility, deconditioning Patient recently discharged from [facility] 03/15/2020 to [facility], SNF, discharge less than 2 weeks.  Poor support at home with husband.  Continues with multiple falls at home due to weakness and requires assistance for all ADLs.  Unable to manage at home despite home health services. --PT/OT recommends HHPT,  --concerns about home living environment; [facility] consultation; but apparently patient has appointment on Thursday to obtain her own apartment  DVT prophylaxis: Heparin Code Status: Full code Family Communication: None present at bedside  Disposition Plan:  Status is: Inpatient  Remains inpatient appropriate because:Unsafe d/c plan   Dispo: The patient is from: Home              Anticipated d/c is to: SNF vs Home              Anticipated d/c date is: 2 days              Patient currently is not medically stable to d/c.   Consultants:  Nephrology  Procedures:   None  Antimicrobials:   None   Subjective: Patient seen and examined bedside, resting comfortably.  Headache improved, but persistent.  Blood pressure now much better controlled, only minor fluctuations  during HD sessions.  HD yesterday, tolerated well.  Patient concerned about her current living situation, patient states has an appointment on Thursday for apartment assistance.  No other complaints or concerns at this time.  Denies fever/chills/night sweats, no vomiting/diarrhea, no chest pain, palpitations, no shortness of breath, no abdominal pain.  No acute events overnight per nursing staff.  Objective: Vitals:   05/31/20 0640 05/31/20 0741 05/31/20 0833 05/31/20 1143  BP: 128/65 (!) 125/59 122/66 (!) 116/58  Pulse:  67  65  Resp:      Temp:  98.2 F (36.8 C)    TempSrc:  Oral    SpO2:  99%  100%  Weight:       No intake or output data in the 24 hours ending 05/31/20 1331 Filed Weights   05/30/20 0700 05/30/20 0928 05/30/20 1312  Weight: 69 kg 67.5 kg 65.6 kg    Examination:  General exam: Appears calm and comfortable, appears slightly older than stated age Respiratory system: Clear to auscultation. Respiratory effort normal.  Oxygen well on room air Cardiovascular system: S1 & S2 heard, RRR. No JVD, murmurs, rubs, gallops or clicks. No pedal edema.  Noted left chest HD permacath with dressing in place Gastrointestinal system: Abdomen is nondistended, soft and nontender. No organomegaly or masses felt. Normal bowel sounds heard. Central nervous system: Alert and oriented. No focal neurological deficits. Extremities: Symmetric 5 x 5 power. Skin: No rashes, lesions or ulcers Psychiatry: Judgement and insight appear poor. Mood & affect appropriate.     Data Reviewed: I have personally reviewed following labs and imaging studies  CBC: Recent Labs  Lab 05/27/20 1429 05/28/20 0230 05/30/20 1010  WBC 7.3 9.4 7.4  HGB 12.1 11.4* 10.0*  HCT 39.8 36.3 32.7*  MCV 86.9 84.0 87.0  PLT 150 142* 353*   Basic Metabolic Panel: Recent Labs  Lab 05/27/20 1429 05/28/20 0230 05/30/20 0233  NA 139 140 135  K 4.2 4.1 4.7  CL 101 103 100  CO2 23 23 23   GLUCOSE 261* 150* 160*   BUN 55* 60* 53*  CREATININE 5.54* 6.28* 5.75*  CALCIUM 8.9 8.7* 8.5*  MG  --   --  2.0  PHOS  --   --  4.4   GFR: Estimated Creatinine Clearance: 9.6 mL/min (A) (by C-G formula based on SCr of 5.75 mg/dL (H)). Liver Function Tests: Recent Labs  Lab 05/28/20 0230 05/30/20 0233  AST 20  --   ALT 24  --   ALKPHOS 88  --   BILITOT 0.6  --   PROT 6.6  --   ALBUMIN 3.4* 2.8*   No results for input(s): LIPASE, AMYLASE in the last 168 hours. No results for input(s): AMMONIA in the last 168 hours. Coagulation Profile: No results for input(s): INR, PROTIME in the last 168 hours. Cardiac Enzymes: No results for input(s): CKTOTAL, CKMB, CKMBINDEX, TROPONINI in the last 168 hours. BNP (last 3 results) No results for input(s): PROBNP in the last 8760 hours. HbA1C: No results for input(s): HGBA1C in the last 72 hours. CBG: Recent Labs  Lab 05/30/20 1654 05/30/20 2126 05/30/20 2307 05/31/20 0739 05/31/20 1140  GLUCAP 238* 161* 169* 169* 113*   Lipid Profile: No results for input(s): CHOL, HDL, LDLCALC, TRIG, CHOLHDL, LDLDIRECT in the  last 72 hours. Thyroid Function Tests: No results for input(s): TSH, T4TOTAL, FREET4, T3FREE, THYROIDAB in the last 72 hours. Anemia Panel: No results for input(s): VITAMINB12, FOLATE, FERRITIN, TIBC, IRON, RETICCTPCT in the last 72 hours. Sepsis Labs: No results for input(s): PROCALCITON, LATICACIDVEN in the last 168 hours.  Recent Results (from the past 240 hour(s))  Respiratory Panel by RT PCR (Flu A&B, Covid) - Nasopharyngeal Swab     Status: None   Collection Time: 05/27/20 10:13 PM   Specimen: Nasopharyngeal Swab; Nasopharyngeal(NP) swabs in vial transport medium  Result Value Ref Range Status   SARS Coronavirus 2 by RT PCR NEGATIVE NEGATIVE Final    Comment: (NOTE) SARS-CoV-2 target nucleic acids are NOT DETECTED.  The SARS-CoV-2 RNA is generally detectable in upper respiratoy specimens during the acute phase of infection. The  lowest concentration of SARS-CoV-2 viral copies this assay can detect is 131 copies/mL. A negative result does not preclude SARS-Cov-2 infection and should not be used as the sole basis for treatment or other patient management decisions. A negative result may occur with  improper specimen collection/handling, submission of specimen other than nasopharyngeal swab, presence of viral mutation(s) within the areas targeted by this assay, and inadequate number of viral copies (<131 copies/mL). A negative result must be combined with clinical observations, patient history, and epidemiological information. The expected result is Negative.  Fact Sheet for Patients:  PinkCheek.be  Fact Sheet for Healthcare Providers:  GravelBags.it  This test is no t yet approved or cleared by the [place] FDA and  has been authorized for detection and/or diagnosis of SARS-CoV-2 by FDA under an Emergency Use Authorization (EUA). This EUA will remain  in effect (meaning this test can be used) for the duration of the COVID-19 declaration under Section 564(b)(1) of the Act, 21 U.S.C. section 360bbb-3(b)(1), unless the authorization is terminated or revoked sooner.     Influenza A by PCR NEGATIVE NEGATIVE Final   Influenza B by PCR NEGATIVE NEGATIVE Final    Comment: (NOTE) The Xpert Xpress SARS-CoV-2/FLU/RSV assay is intended as an aid in  the diagnosis of influenza from Nasopharyngeal swab specimens and  should not be used as a sole basis for treatment. Nasal washings and  aspirates are unacceptable for Xpert Xpress SARS-CoV-2/FLU/RSV  testing.  Fact Sheet for Patients: PinkCheek.be  Fact Sheet for Healthcare Providers: GravelBags.it  This test is not yet approved or cleared by the [place] FDA and  has been authorized for detection and/or diagnosis of SARS-CoV-2 by  FDA under  an Emergency Use Authorization (EUA). This EUA will remain  in effect (meaning this test can be used) for the duration of the  Covid-19 declaration under Section 564(b)(1) of the Act, 21  U.S.C. section 360bbb-3(b)(1), unless the authorization is  terminated or revoked. Performed at [facility], [place] [address] [place], [place] [address]          Radiology Studies: No results found.      Scheduled Meds: . acidophilus  1 capsule Oral Daily  . amLODipine  10 mg Oral Daily  . atropine  1 drop Right Eye TID  . carvedilol  25 mg Oral BID WC  . Chlorhexidine Gluconate Cloth  6 each Topical Q0600  . gabapentin  100 mg Oral Daily  . gabapentin  200 mg Oral QHS  . heparin  5,000 Units Subcutaneous Q8H  . hydrALAZINE  75 mg Oral Q8H  . insulin aspart  0-6 Units Subcutaneous TID WC  .  insulin glargine  5 Units Subcutaneous QHS  . isosorbide dinitrate  20 mg Oral TID  . magnesium oxide  400 mg Oral BID  . sodium chloride flush  3 mL Intravenous Q12H   Continuous Infusions:   LOS: 3 days    Time spent: 34 minutes spent on chart review, discussion with nursing staff, consultants, updating family and interview/physical exam; more than 50% of that time was spent in counseling and/or coordination of care.    Aaric Dolph J [place] DO [facility] Available via Epic secure chat 7am-7pm After these hours, please refer to coverage provider listed on [email or web address] 05/31/2020, 1:31 PM

## 2020-06-01 DIAGNOSIS — I161 Hypertensive emergency: Secondary | ICD-10-CM | POA: Diagnosis not present

## 2020-06-01 LAB — GLUCOSE, CAPILLARY
Glucose-Capillary: 118 mg/dL — ABNORMAL HIGH (ref 70–99)
Glucose-Capillary: 145 mg/dL — ABNORMAL HIGH (ref 70–99)
Glucose-Capillary: 220 mg/dL — ABNORMAL HIGH (ref 70–99)
Glucose-Capillary: 190 mg/dL — ABNORMAL HIGH (ref 70–99)
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
## 2020-06-01 NOTE — TOC Initial Note (Signed)
Transition of Care [facility]) - Initial/Assessment Note    Patient Details  Name: [name] [name] MRN: [id number] Date of Birth: Nov 19, 1965  Transition of [facility]) CM/SW Contact:    Carles Collet, RN Phone Number: 06/01/2020, 3:22 PM  Clinical Narrative:              Damaris Schooner w patient over the phone. She is from home w spouse, plans are to return home at DC and resume [facility] services w [facility]. She states she is currently active w [facility]. Multiple attempts to contact [facility] liaison unsuccessful, upon calling branch call automatically disconnects. Patient states that she has RW WC, 3/1, hospital bed at home, no need for additional DME.   Will need HH orders and referral placed to [facility] to verify services.    Expected Discharge Plan: [place] Barriers to Discharge: Continued Medical Work up   Patient Goals and CMS Choice Patient states their goals for this hospitalization and ongoing recovery are:: to go home CMS Medicare.gov Compare Post Acute Care list provided to:: Patient Choice offered to / list presented to : Patient  Expected Discharge Plan and Services Expected Discharge Plan: [place] In-house Referral: Clinical Social Work Discharge Planning Services: CM Consult Post Acute Care Choice: Home Health                             HH Arranged: PT, OT [facility] Agency: [facility]     Representative spoke with at [place]: Could not reach anyone  Prior Living Arrangements/Services   Lives with:: Spouse                   Activities of Daily Living      Permission Sought/Granted                  Emotional Assessment              Admission diagnosis:  Hypertensive emergency [I16.1] Patient Active Problem List   Diagnosis Date Noted  . Hypertensive emergency   . Pressure injury of skin 03/12/2020  . Sepsis ([place]) 03/07/2020  . ESRD (end stage renal disease) ([place]) 03/07/2020  . Hyperkalemia 03/07/2020   . Acute cystitis 03/07/2020  . Acute metabolic encephalopathy 57/32/2025  . 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 Diabetes mellitus with complication ([place])   . Intractable headache   . Syncope 07/28/2015  . Chronic headaches 07/28/2015  . Chest pain 05/22/2015  . Urinary frequency 03/13/2015  . Easy bruising 10/09/2014  . Encounter for preventative adult health care exam with abnormal findings 01/11/2014  . Chronic constipation 01/11/2014  . GERD (gastroesophageal reflux disease) 01/11/2014  . Helicobacter positive gastritis 12/06/2013  . Dizziness and giddiness 10/12/2013  . Essential hypertension 09/19/2013  . Back pain 06/11/2013  . Lumbosacral spondylosis without myelopathy 02/20/2013  . Diabetic neuropathy ([place]) 01/09/2013  . DM (diabetes mellitus), type 2, uncontrolled ([place]) 10/02/2012  . MITRAL REGURGITATION, mild 10/31/2009  . PLEURAL EFFUSION, RIGHT after pyelonephritis in April 2011 (no intervention) 10/20/2009   PCP:  Myrtis Hopping., MD Pharmacy:   [facility] [place] [address] Phone: [phone] Fax: [phone]     Social Determinants of Health (SDOH) Interventions    Readmission Risk Interventions No flowsheet data found.

## 2020-06-01 NOTE — Progress Notes (Signed)
[facility] Progress Note  Name: [name] [name] MRN: [id number] DOB: 14-Sep-1965  Chief Complaint:  Elevated BP at HD  Subjective:  Patient feels well today without complaints.  Planning for dialysis tomorrow  Review of systems:  12 systems reviewed and negative except per subjective     Intake/Output Summary (Last 24 hours) at 06/01/2020 1054 Last data filed at 06/01/2020 0900 Gross per 24 hour  Intake 883 ml  Output --  Net 883 ml    Vitals:  Vitals:   05/31/20 1733 05/31/20 1949 05/31/20 2000 06/01/20 0634  BP: 121/60 (!) 151/75 127/64 122/69  Pulse:  88 89 63  Resp:  19 18 16   Temp:  98 F (36.7 C)  99.8 F (37.7 C)  TempSrc:  Oral  Oral  SpO2:  100% 98% 100%  Weight:    67.5 kg     Physical Exam:  General: Adult female in bed in no acute distress at rest HEENT: Normocephalic atraumatic Eyes: Extraocular movements intact sclera anicteric Heart: S1-S2 no rub appreciated Lungs:  Bilateral chest rise, no increased work of breathing Abdomen: Soft nontender nondistended Extremities: warm and well perfused, no edema Skin: No rash on extremities exposed Neuro: Alert and oriented x3 provides a history and follows command Psych normal mood and affect Dialysis Access left IJ tunnel catheter  Medications reviewed   Labs:  BMP Latest Ref Rng & Units 05/30/2020 05/28/2020 05/27/2020  Glucose 70 - 99 mg/dL 160(H) 150(H) 261(H)  BUN 6 - 20 mg/dL 53(H) 60(H) 55(H)  Creatinine 0.44 - 1.00 mg/dL 5.75(H) 6.28(H) 5.54(H)  Sodium 135 - 145 mmol/L 135 140 139  Potassium 3.5 - 5.1 mmol/L 4.7 4.1 4.2  Chloride 98 - 111 mmol/L 100 103 101  CO2 22 - 32 mmol/L 23 23 23   Calcium 8.9 - 10.3 mg/dL 8.5(L) 8.7(L) 8.9   Outpatient HD admit:  [facility] BF 350 DF 700  EDW 65 kg  3.5 hours F180 2K/3.0 Ca Heparin with tx 5000 bolus and 500 hourly  Left IJ Tunn Catheter Last tx on 11/21 with post weight of 65 kg  Assessment/Plan:   # End-stage renal  disease - Normally Monday Wednesday Friday at [facility]  -Continue MWF schedule - Can continue to challenge dry weight as needed  # Hypertensive Emergency - resolved now with HTN better controlled - Would continue current home regimen for now - BP is improved  -Consider clonidine patch outpatient to improve compliance  # Headache: Improved - Felt secondary in part to uncontrolled HTN  - Pain control per primary team   # Anemia CKD  - Mild and no acute need for ESA  Disposition per primary team.  Likely awaiting SNF  Reesa Chew, MD 06/01/2020 10:54 AM

## 2020-06-02 DIAGNOSIS — I161 Hypertensive emergency: Secondary | ICD-10-CM | POA: Diagnosis not present

## 2020-06-02 LAB — BASIC METABOLIC PANEL
Anion gap: 13 (ref 5–15)
BUN: 62 mg/dL — ABNORMAL HIGH (ref 6–20)
CO2: 24 mmol/L (ref 22–32)
Calcium: 8.4 mg/dL — ABNORMAL LOW (ref 8.9–10.3)
Chloride: 98 mmol/L (ref 98–111)
Creatinine, Ser: 7.43 mg/dL — ABNORMAL HIGH (ref 0.44–1.00)
GFR, Estimated: 6 mL/min — ABNORMAL LOW (ref >60)
Glucose, Bld: 139 mg/dL — ABNORMAL HIGH (ref 70–99)
Potassium: 5.3 mmol/L — ABNORMAL HIGH (ref 3.5–5.1)
Sodium: 135 mmol/L (ref 135–145)

## 2020-06-02 LAB — CBC
HCT: 30.7 % — ABNORMAL LOW (ref 36.0–46.0)
Hemoglobin: 9.2 g/dL — ABNORMAL LOW (ref 12.0–15.0)
MCH: 25.8 pg — ABNORMAL LOW (ref 26.0–34.0)
MCHC: 30 g/dL (ref 30.0–36.0)
MCV: 86 fL (ref 80.0–100.0)
Platelets: 128 10*3/uL — ABNORMAL LOW (ref 150–400)
RBC: 3.57 MIL/uL — ABNORMAL LOW (ref 3.87–5.11)
RDW: 19.4 % — ABNORMAL HIGH (ref 11.5–15.5)
WBC: 8.4 10*3/uL (ref 4.0–10.5)
nRBC: 0 % (ref 0.0–0.2)

## 2020-06-02 LAB — GLUCOSE, CAPILLARY
Glucose-Capillary: 125 mg/dL — ABNORMAL HIGH (ref 70–99)
Glucose-Capillary: 159 mg/dL — ABNORMAL HIGH (ref 70–99)
Glucose-Capillary: 129 mg/dL — ABNORMAL HIGH (ref 70–99)

## 2020-06-02 NOTE — Progress Notes (Signed)
Patient ID: [name] [name], female   DOB: [birth year], 54 y.o.   MRN: [id number] S: Feeling better, no new complaints. O:BP 134/69 (BP Location: Left Arm)   Pulse 66   Temp 97.8 F (36.6 C) (Oral)   Resp 18   Wt 68.2 kg   SpO2 100%   BMI 29.36 kg/m   Intake/Output Summary (Last 24 hours) at 06/02/2020 1530 Last data filed at 06/02/2020 0930 Gross per 24 hour  Intake 600 ml  Output --  Net 600 ml   Intake/Output: I/O last 3 completed shifts: In: 5809 [P.O.:1420; I.V.:6] Out: -   Intake/Output this shift:  Total I/O In: 120 [P.O.:120] Out: -  Weight change: 0.7 kg Gen: NAD CVS: rrr, no rub Resp: cta Abd: +Bs, soft, ,NT/ND Ext: no edema  Recent Labs  Lab 05/27/20 1429 05/28/20 0230 05/30/20 0233 06/02/20 0757  NA 139 140 135 135  K 4.2 4.1 4.7 5.3*  CL 101 103 100 98  CO2 23 23 23 24   GLUCOSE 261* 150* 160* 139*  BUN 55* 60* 53* 62*  CREATININE 5.54* 6.28* 5.75* 7.43*  ALBUMIN  --  3.4* 2.8*  --   CALCIUM 8.9 8.7* 8.5* 8.4*  PHOS  --   --  4.4  --   AST  --  20  --   --   ALT  --  24  --   --    Liver Function Tests: Recent Labs  Lab 05/28/20 0230 05/30/20 0233  AST 20  --   ALT 24  --   ALKPHOS 88  --   BILITOT 0.6  --   PROT 6.6  --   ALBUMIN 3.4* 2.8*   No results for input(s): LIPASE, AMYLASE in the last 168 hours. No results for input(s): AMMONIA in the last 168 hours. CBC: Recent Labs  Lab 05/27/20 1429 05/27/20 1429 05/28/20 0230 05/30/20 1010 06/02/20 0757  WBC 7.3   < > 9.4 7.4 8.4  HGB 12.1   < > 11.4* 10.0* 9.2*  HCT 39.8   < > 36.3 32.7* 30.7*  MCV 86.9  --  84.0 87.0 86.0  PLT 150   < > 142* 119* 128*   < > = values in this interval not displayed.   Cardiac Enzymes: No results for input(s): CKTOTAL, CKMB, CKMBINDEX, TROPONINI in the last 168 hours. CBG: Recent Labs  Lab 06/01/20 1123 06/01/20 1705 06/01/20 2126 06/02/20 0644 06/02/20 1217  GLUCAP 145* 220* 190* 125* 159*    Iron Studies: No results for input(s):  IRON, TIBC, TRANSFERRIN, FERRITIN in the last 72 hours. Studies/Results: No results found. Marland Kitchen acidophilus  1 capsule Oral Daily  . amLODipine  10 mg Oral Daily  . atropine  1 drop Right Eye TID  . carvedilol  25 mg Oral BID WC  . Chlorhexidine Gluconate Cloth  6 each Topical Q0600  . gabapentin  100 mg Oral Daily  . gabapentin  200 mg Oral QHS  . heparin  5,000 Units Subcutaneous Q8H  . hydrALAZINE  75 mg Oral Q8H  . insulin aspart  0-6 Units Subcutaneous TID WC  . insulin glargine  5 Units Subcutaneous QHS  . isosorbide dinitrate  20 mg Oral TID  . magnesium oxide  400 mg Oral BID  . sodium chloride flush  3 mL Intravenous Q12H    BMET    Component Value Date/Time   NA 135 06/02/2020 0757   K 5.3 (H) 06/02/2020 0757   CL 98 06/02/2020  0757   CO2 24 06/02/2020 0757   GLUCOSE 139 (H) 06/02/2020 0757   BUN 62 (H) 06/02/2020 0757   CREATININE 7.43 (H) 06/02/2020 0757   CREATININE 0.68 10/04/2013 1418   CALCIUM 8.4 (L) 06/02/2020 0757   GFRNONAA 6 (L) 06/02/2020 0757   GFRNONAA >89 10/04/2013 1418   GFRAA 10 (L) 03/14/2020 0628   GFRAA >89 10/04/2013 1418   CBC    Component Value Date/Time   WBC 8.4 06/02/2020 0757   RBC 3.57 (L) 06/02/2020 0757   HGB 9.2 (L) 06/02/2020 0757   HCT 30.7 (L) 06/02/2020 0757   PLT 128 (L) 06/02/2020 0757   MCV 86.0 06/02/2020 0757   MCH 25.8 (L) 06/02/2020 0757   MCHC 30.0 06/02/2020 0757   RDW 19.4 (H) 06/02/2020 0757   LYMPHSABS 3.2 03/14/2020 0628   MONOABS 0.7 03/14/2020 0628   EOSABS 0.2 03/14/2020 0628   BASOSABS 0.1 03/14/2020 0628   Outpatient HD admit:  [facility] BF 350, DF 700  EDW 65 kg  3.5 hours F180, 2K/3.0 Ca Heparin with tx 5000 bolus and 500 hourly  Left IJ Tunn Catheter Last tx on 11/21 with post weight of 65 kg  Assessment/Plan:  1. ESRD- continue with MWF schedule.  Will hopefully be discharged tomorrow and can follow up on Wednesday for outpatient dialysis. 2. Hypertensive emergency- resolved and BP  well controlled.  Agree that clonidine patch may improve compliance. 3. Anemia of ESRD- stable 4. DM type 2- per primary 5. Multiple falls/weakness/debility- recommended SNF per PT/OT.  Sw involved but pt wants home with HHC.   6. Disposition- hopeful discharge tomorrow and f/u with outpatient dialysis on her regular schedule.  Donetta Potts, MD [employer] [phone]

## 2020-06-02 NOTE — Progress Notes (Signed)
Renal Navigator provided update to HD clinic Social Worker on patient's decision to decline SNF recommendation and decision to return home with [facility] in order to provide continuity of care as it was HD Clinic Social Worker who had given Renal Navigator report of patient in need or SNF and PCP office in process of placing patient in SNF prior to admission.  Alphonzo Cruise, [place] Renal Navigator [phone]

## 2020-06-02 NOTE — Progress Notes (Signed)
Occupational Therapy Treatment Patient Details Name: [name] [name] MRN: [id number] DOB: 18-Nov-1965 Today's Date: 06/02/2020    History of present illness Pt is a 54 year old female who presented from dialysis center with HTN, headache, blurred vision, nausea, and bouts of emesis. She has a hx of persistent HTN despite antihypertensives. Chest x-ray revealed cardiomegaly. CT and MRI of head were negative for any acute intracranial abnormalities. PMH: syncope and collapse, diabetic neuropathy, mitral regurgitation, impaired vision, HTN, heart murmur, colitis, and DM.   OT comments  OT treatment session with focus on functional transfers, activity tolerance, and education on CLOF and need for external assistance. Patient requiring heavy Mod A and increased time/effort for sit to stand from low recliner with use of RW, Min A for functional mobility, and Min A to Mod A for ADL transfers. Patient continues to be limited by decreased activity tolerance, generalized weakness, R foot drop, and increased need for assistance with ADLs. Patient acknowledges increased need for assistance and recognizes that she is currently functioning below baseline. Patient also wishing to move into an apartment alone in the coming months and will no longer have assistance from her husband. Udated recommendation for SNF rehab vs. HHOT with 24hr supervision/assist.     Follow Up Recommendations  SNF;Home health OT;Supervision/Assistance - 24 hour    Equipment Recommendations  None recommended by OT    Recommendations for Other Services      Precautions / Restrictions Precautions Precautions: Fall Precaution Comments: watch BP Restrictions Weight Bearing Restrictions: No       Mobility Bed Mobility Overal bed mobility: Needs Assistance Bed Mobility: Sit to Supine     Supine to sit: Min assist Sit to supine: Mod assist   General bed mobility comments: Mod A for return to supine to assist BLE from EOB to bed  level. Patient able to lower trunk without assist.   Transfers Overall transfer level: Needs assistance Equipment used: Rolling walker (2 wheeled) Transfers: Sit to/from [facility] Sit to Stand: Mod assist;From elevated surface Stand pivot transfers: Min assist       General transfer comment: modA for sit to stand from elevated surface with RW. cues for hand placement and patient with use of momentum to complete standing on 2nd trial.     Balance Overall balance assessment: Needs assistance Sitting-balance support: No upper extremity supported;Feet supported Sitting balance-Leahy Scale: Fair     Standing balance support: Bilateral upper extremity supported;During functional activity Standing balance-Leahy Scale: Poor Standing balance comment: Reliant on UE support for balance.                           ADL either performed or assessed with clinical judgement   ADL Overall ADL's : Needs assistance/impaired     Grooming: Set up;Sitting               Lower Body Dressing: Maximal assistance;Sit to/from stand Lower Body Dressing Details (indicate cue type and reason): Patient reports use of AE at baseline with additional assistance from [facility].  Toilet Transfer: Minimal Systems analyst Details (indicate cue type and reason): Min A with use of RW and increased time/effort.  Toileting- Clothing Manipulation and Hygiene: Minimal assistance;Sitting/lateral lean Toileting - Clothing Manipulation Details (indicate cue type and reason): Patient able to complete hygiene in sitting. Min A for clothing management in standing.      Functional mobility during ADLs: Minimal assistance;Moderate assistance;Rolling walker  Vision       Perception     Praxis      Cognition Arousal/Alertness: Awake/alert Behavior During Therapy: WFL for tasks assessed/performed Overall Cognitive Status: Impaired/Different from  baseline Area of Impairment: Safety/judgement                         Safety/Judgement: Decreased awareness of safety     General Comments: Patient requiring more assistance this A.M. with PT but is insistent on returning home with [facility] demonstrating decreased awareness of need for external assistance.         Exercises Exercises: General Lower Extremity General Exercises - Lower Extremity Hip Flexion/Marching: Both;10 reps   Shoulder Instructions       General Comments      Pertinent Vitals/ Pain       Pain Assessment: Faces Faces Pain Scale: No hurt  Home Living Family/patient expects to be discharged to:: Private residence                                        Prior Functioning/Environment              Frequency  Min 2X/week        Progress Toward Goals  OT Goals(current goals can now be found in the care plan section)  Progress towards OT goals: Progressing toward goals  Acute Rehab OT Goals Patient Stated Goal: To return home.  OT Goal Formulation: With patient Time For Goal Achievement: 06/12/20 Potential to Achieve Goals: Good ADL Goals Pt Will Perform Grooming: with set-up;sitting Pt Will Perform Lower Body Bathing: with set-up;sitting/lateral leans;with adaptive equipment Pt Will Perform Lower Body Dressing: with set-up;with adaptive equipment;sitting/lateral leans Pt Will Transfer to Toilet: with supervision;stand pivot transfer;bedside commode Pt Will Perform Toileting - Clothing Manipulation and hygiene: with supervision;sitting/lateral leans;sit to/from stand  Plan      Co-evaluation                 AM-PAC OT "6 Clicks" Daily Activity     Outcome Measure   Help from another person eating meals?: None Help from another person taking care of personal grooming?: A Little Help from another person toileting, which includes using toliet, bedpan, or urinal?: A Little Help from another person bathing (including  washing, rinsing, drying)?: A Lot Help from another person to put on and taking off regular upper body clothing?: A Little Help from another person to put on and taking off regular lower body clothing?: A Lot 6 Click Score: 17    End of Session Equipment Utilized During Treatment: Gait belt;Rolling walker  OT Visit Diagnosis: Unsteadiness on feet (R26.81);Other abnormalities of gait and mobility (R26.89);Muscle weakness (generalized) (M62.81)   Activity Tolerance Patient tolerated treatment well   Patient Left in chair;with call bell/phone within reach;with chair alarm set   Nurse Communication Mobility status        Time: 9977-4142 OT Time Calculation (min): 44 min  Charges: OT General Charges $OT Visit: 1 Visit OT Treatments $Self Care/Home Management : 38-52 mins  Jackelynn Hosie H. OTR/L Supplemental OT, Department of rehab services [phone]  Karl Erway R H. 06/02/2020, 2:52 PM

## 2020-06-02 NOTE — Progress Notes (Signed)
Physical Therapy Treatment Patient Details Name: [name] [name] MRN: [id number] DOB: 1966-04-04 Today's Date: 06/02/2020    History of Present Illness Pt is a 54 year old female who presented from dialysis center with HTN, headache, blurred vision, nausea, and bouts of emesis. She has a hx of persistent HTN despite antihypertensives. Chest x-ray revealed cardiomegaly. CT and MRI of head were negative for any acute intracranial abnormalities. PMH: syncope and collapse, diabetic neuropathy, mitral regurgitation, impaired vision, HTN, heart murmur, colitis, and DM.    PT Comments    Patient requires more assistance this session than previous. Patient presents today with increased fatigue, decreased activity tolerance, generalized weakness, impaired balance, decreased awareness of safety and need for assistance, and impaired functional mobility. Patient required minA for supine to sit and maxA for return to supine for B LE advancement to EOB and onto bed. Patient required modA for sit to stand transfer with RW and elevated surface, use of momentum by patient on 2nd trial. Attempted ambulation with fwd step on R, noted R knee buckling requiring modA to return to EOB. At this time, patient is not safe to return home due to need for assistance for all mobility. Discharge recommendation updated to SNF due to increased need for assistance and decreased caregiver support at home to provide 24/7 supervision/assistance.     Follow Up Recommendations  SNF;Supervision/Assistance - 24 hour     Equipment Recommendations  None recommended by PT    Recommendations for Other Services       Precautions / Restrictions Precautions Precautions: Fall Precaution Comments: watch BP Restrictions Weight Bearing Restrictions: No    Mobility  Bed Mobility Overal bed mobility: Needs Assistance Bed Mobility: Supine to Sit;Sit to Supine     Supine to sit: Min assist Sit to supine: Max assist   General bed  mobility comments: minA for advancement of B LEs and repositioning of hips on EOB. maxA for return to supine due to B LE weakness   Transfers Overall transfer level: Needs assistance Equipment used: Rolling [name] [name] (2 wheeled) Transfers: Sit to/from Stand Sit to Stand: Mod assist;From elevated surface         General transfer comment: modA for sit to stand from elevated surface with RW. cues for hand placement and patient with use of momentum to complete standing on 2nd trial.   Ambulation/Gait Ambulation/Gait assistance: Mod assist Gait Distance (Feet): 1 Feet Assistive device: Rolling [name] [name] (2 wheeled)       General Gait Details: attempted gait with fwd step with R LE, noted R knee buckling requiring modA to return to EOB. Patient states she is very tired and fatigued today.   Stairs             Wheelchair Mobility    Modified Rankin (Stroke Patients Only)       Balance Overall balance assessment: Needs assistance Sitting-balance support: No upper extremity supported;Feet supported Sitting balance-Leahy Scale: Fair     Standing balance support: Bilateral upper extremity supported;During functional activity Standing balance-Leahy Scale: Poor Standing balance comment: Reliant on UE support for balance.                            Cognition Arousal/Alertness: Awake/alert Behavior During Therapy: WFL for tasks assessed/performed Overall Cognitive Status: Impaired/Different from baseline Area of Impairment: Safety/judgement  Safety/Judgement: Decreased awareness of safety     General Comments: A&Ox4. Patient states she will be going home, however unaware of need for assistance and safety due difficulty ambulating      Exercises General Exercises - Lower Extremity Hip Flexion/Marching: Both;10 reps    General Comments        Pertinent Vitals/Pain Pain Assessment: Faces Faces Pain Scale: No hurt    Home  Living                      Prior Function            PT Goals (current goals can now be found in the care plan section) Acute Rehab PT Goals Patient Stated Goal: to leave her husband and get a wheelchair accessible van to drive PT Goal Formulation: With patient Time For Goal Achievement: 06/12/20 Potential to Achieve Goals: Fair Progress towards PT goals: Not progressing toward goals - comment    Frequency    Min 2X/week      PT Plan Discharge plan needs to be updated    Co-evaluation              AM-PAC PT "6 Clicks" Mobility   Outcome Measure  Help needed turning from your back to your side while in a flat bed without using bedrails?: A Lot Help needed moving from lying on your back to sitting on the side of a flat bed without using bedrails?: A Lot Help needed moving to and from a bed to a chair (including a wheelchair)?: A Lot Help needed standing up from a chair using your arms (e.g., wheelchair or bedside chair)?: A Lot Help needed to walk in hospital room?: A Lot Help needed climbing 3-5 steps with a railing? : Total 6 Click Score: 11    End of Session Equipment Utilized During Treatment: Gait belt Activity Tolerance: Patient limited by fatigue Patient left: in bed;with call bell/phone within reach;with bed alarm set Nurse Communication: Mobility status PT Visit Diagnosis: Unsteadiness on feet (R26.81);Other abnormalities of gait and mobility (R26.89);Muscle weakness (generalized) (M62.81);Difficulty in walking, not elsewhere classified (R26.2)     Time: 9480-1655 PT Time Calculation (min) (ACUTE ONLY): 28 min  Charges:  $Therapeutic Activity: 23-37 mins                     [name] [name], PT, DPT Acute Rehabilitation Services Pager [phone] Office [phone]    [name] [name] 06/02/2020, 11:28 AM

## 2020-06-02 NOTE — Progress Notes (Signed)
PROGRESS NOTE    [name] [name]  NLZ:[id number] DOB: 10-04-1965 DOA: 05/27/2020 PCP: Myrtis Hopping., MD    Brief Narrative:  [name] [name] is a 54 year old female with past medical history notable for ESRD on HD MWF, hyperlipidemia, type 2 diabetes mellitus, GERD, essential hypertension, neuropathy who presented to the ED with headache, blurred vision and nausea with vomiting.  Patient with Gwyndolyn Saxon hypertension with poor control despite multiple antihypertensive medications, although history of medication noncompliance.  Patient reports elevated blood pressure over the past 2 weeks.  Patient currently on a holiday schedule for HD, last HD Sunday.  Was supposed to do HD on Tuesday but was directed to the ED due to elevated blood pressure.  In the ED, BP 245/112.  CT head without acute changes.  Chest x-ray with cardiomegaly.  Critical care medicine was consulted and recommend admission to hospital service.  Hospitalist service consulted for further evaluation and management.   Assessment & Plan:   Principal Problem:   Hypertensive emergency Active Problems:   DM (diabetes mellitus), type 2, uncontrolled ([place])   Diabetic neuropathy ([place])   Essential hypertension   Diabetes mellitus with complication ([place])   ESRD (end stage renal disease) ([place])   Hypertensive emergency History of essential/malignant HTN Patient presenting to the ED with 2-week history of poorly controlled hypertension associated with headache, visual changes and nausea/vomiting.  Blood pressure on arrival 245/112.  CT head with no acute intracranial abnormality.  MR brain unrevealing.  Initially started on Cleviprex drip which was weaned off.  PCCM consulted, no further recommendations; no need for ICU admission. Troponin 17, normal. TTE with LVEF 90-24%, grade 2 diastolic dysfunction, trivial MR, IVC normal in size. --BP 129/64 this morning --Amlodipine 10 mg p.o. daily --Carvedilol 25 mg p.o. twice daily --Isosorbide  dinitrate 20 mg TID --Hydralazine 75 mg PO q8h --Continue to monitor blood pressure closely, continue to uptitrate hydralazine as needed vs starting clonidine patch if needed  ESRD on HD MWF Patient dialyzes via left chest permacath.  Currently undergoes HD on MWF schedule at [facility]. --Nephrology consulted for continued HD while inpatient  Type 2 diabetes mellitus Home regimen includes Lantus 10 units subcutaneously daily, Humalog insulin sliding scale.  Hemoglobin A1c 5.8 on 03/09/2020, well controlled. --Lantus 5 units subcutaneously daily --Very sensitive insulin sliding scale for further coverage --CBGs before every meal/at bedtime --Continue monitor and adjust accordingly  Peripheral neuropathy --Gabapentin 100mg  daily and 200mg  qHS  Multiple falls, weakness, debility, deconditioning Patient recently [facility] to [facility], SNF, discharge less than 2 weeks.  Poor support at home with husband.  Continues with multiple falls at home due to weakness and requires assistance for all ADLs.  Unable to manage at home despite home health services. --PT updated recommendations for SNF placement, messaged social work for work-up and placement  DVT prophylaxis: Heparin Code Status: Full code Family Communication: None present at bedside  Disposition Plan:  Status is: Inpatient  Remains inpatient appropriate because:Unsafe d/c plan   Dispo: The patient is from: Home              Anticipated d/c is to: SNF vs Home              Anticipated d/c date is: 1 day              Patient currently is not medically stable to d/c.   Consultants:   Nephrology  Procedures:   None  Antimicrobials:  None   Subjective: Patient seen and examined bedside, resting comfortably.  Blood pressure remains well controlled.  Reports continued fatigue and weakness.  Seen by PT this morning with update to recommendation for SNF placement.  No other complaints or  concerns at this time. Denies fever/chills/night sweats, no vomiting/diarrhea, no chest pain, palpitations, no shortness of breath, no abdominal pain.  No acute events overnight per nursing staff.  Objective: Vitals:   06/01/20 1413 06/01/20 2024 06/01/20 2200 06/02/20 0452  BP: 116/67 (!) 116/59 (!) 141/82 129/64  Pulse:  62  66  Resp:  18  19  Temp:  97.6 F (36.4 C)  98.6 F (37 C)  TempSrc:  Oral  Oral  SpO2:  100%  100%  Weight:    68.2 kg    Intake/Output Summary (Last 24 hours) at 06/02/2020 1238 Last data filed at 06/02/2020 0930 Gross per 24 hour  Intake 900 ml  Output --  Net 900 ml   Filed Weights   05/30/20 1312 06/01/20 0634 06/02/20 0452  Weight: 65.6 kg 67.5 kg 68.2 kg    Examination:  General exam: Appears calm and comfortable, appears slightly older than stated age Respiratory system: Clear to auscultation. Respiratory effort normal.  Oxygen well on room air Cardiovascular system: S1 & S2 heard, RRR. No JVD, murmurs, rubs, gallops or clicks. No pedal edema.  Noted left chest HD permacath with dressing in place Gastrointestinal system: Abdomen is nondistended, soft and nontender. No organomegaly or masses felt. Normal bowel sounds heard. Central nervous system: Alert and oriented. No focal neurological deficits. Extremities: Symmetric 5 x 5 power. Skin: No rashes, lesions or ulcers Psychiatry: Judgement and insight appear poor. Mood & affect appropriate.     Data Reviewed: I have personally reviewed following labs and imaging studies  CBC: Recent Labs  Lab 05/27/20 1429 05/28/20 0230 05/30/20 1010 06/02/20 0757  WBC 7.3 9.4 7.4 8.4  HGB 12.1 11.4* 10.0* 9.2*  HCT 39.8 36.3 32.7* 30.7*  MCV 86.9 84.0 87.0 86.0  PLT 150 142* 119* 161*   Basic Metabolic Panel: Recent Labs  Lab 05/27/20 1429 05/28/20 0230 05/30/20 0233 06/02/20 0757  NA 139 140 135 135  K 4.2 4.1 4.7 5.3*  CL 101 103 100 98  CO2 23 23 23 24   GLUCOSE 261* 150* 160* 139*   BUN 55* 60* 53* 62*  CREATININE 5.54* 6.28* 5.75* 7.43*  CALCIUM 8.9 8.7* 8.5* 8.4*  MG  --   --  2.0  --   PHOS  --   --  4.4  --    GFR: Estimated Creatinine Clearance: 7.5 mL/min (A) (by C-G formula based on SCr of 7.43 mg/dL (H)). Liver Function Tests: Recent Labs  Lab 05/28/20 0230 05/30/20 0233  AST 20  --   ALT 24  --   ALKPHOS 88  --   BILITOT 0.6  --   PROT 6.6  --   ALBUMIN 3.4* 2.8*   No results for input(s): LIPASE, AMYLASE in the last 168 hours. No results for input(s): AMMONIA in the last 168 hours. Coagulation Profile: No results for input(s): INR, PROTIME in the last 168 hours. Cardiac Enzymes: No results for input(s): CKTOTAL, CKMB, CKMBINDEX, TROPONINI in the last 168 hours. BNP (last 3 results) No results for input(s): PROBNP in the last 8760 hours. HbA1C: No results for input(s): HGBA1C in the last 72 hours. CBG: Recent Labs  Lab 06/01/20 1123 06/01/20 1705 06/01/20 2126 06/02/20 0644 06/02/20 1217  GLUCAP 145*  220* 190* 125* 159*   Lipid Profile: No results for input(s): CHOL, HDL, LDLCALC, TRIG, CHOLHDL, LDLDIRECT in the last 72 hours. Thyroid Function Tests: No results for input(s): TSH, T4TOTAL, FREET4, T3FREE, THYROIDAB in the last 72 hours. Anemia Panel: No results for input(s): VITAMINB12, FOLATE, FERRITIN, TIBC, IRON, RETICCTPCT in the last 72 hours. Sepsis Labs: No results for input(s): PROCALCITON, LATICACIDVEN in the last 168 hours.  Recent Results (from the past 240 hour(s))  Respiratory Panel by RT PCR (Flu A&B, Covid) - Nasopharyngeal Swab     Status: None   Collection Time: 05/27/20 10:13 PM   Specimen: Nasopharyngeal Swab; Nasopharyngeal(NP) swabs in vial transport medium  Result Value Ref Range Status   SARS Coronavirus 2 by RT PCR NEGATIVE NEGATIVE Final    Comment: (NOTE) SARS-CoV-2 target nucleic acids are NOT DETECTED.  The SARS-CoV-2 RNA is generally detectable in upper respiratoy specimens during the acute phase  of infection. The lowest concentration of SARS-CoV-2 viral copies this assay can detect is 131 copies/mL. A negative result does not preclude SARS-Cov-2 infection and should not be used as the sole basis for treatment or other patient management decisions. A negative result may occur with  improper specimen collection/handling, submission of specimen other than nasopharyngeal swab, presence of viral mutation(s) within the areas targeted by this assay, and inadequate number of viral copies (<131 copies/mL). A negative result must be combined with clinical observations, patient history, and epidemiological information. The expected result is Negative.  Fact Sheet for Patients:  PinkCheek.be  Fact Sheet for Healthcare Providers:  GravelBags.it  This test is no t yet approved or cleared by the [place] FDA and  has been authorized for detection and/or diagnosis of SARS-CoV-2 by FDA under an Emergency Use Authorization (EUA). This EUA will remain  in effect (meaning this test can be used) for the duration of the COVID-19 declaration under Section 564(b)(1) of the Act, 21 U.S.C. section 360bbb-3(b)(1), unless the authorization is terminated or revoked sooner.     Influenza A by PCR NEGATIVE NEGATIVE Final   Influenza B by PCR NEGATIVE NEGATIVE Final    Comment: (NOTE) The Xpert Xpress SARS-CoV-2/FLU/RSV assay is intended as an aid in  the diagnosis of influenza from Nasopharyngeal swab specimens and  should not be used as a sole basis for treatment. Nasal washings and  aspirates are unacceptable for Xpert Xpress SARS-CoV-2/FLU/RSV  testing.  Fact Sheet for Patients: PinkCheek.be  Fact Sheet for Healthcare Providers: GravelBags.it  This test is not yet approved or cleared by the [place] FDA and  has been authorized for detection and/or diagnosis of  SARS-CoV-2 by  FDA under an Emergency Use Authorization (EUA). This EUA will remain  in effect (meaning this test can be used) for the duration of the  Covid-19 declaration under Section 564(b)(1) of the Act, 21  U.S.C. section 360bbb-3(b)(1), unless the authorization is  terminated or revoked. Performed at [facility], [place] [address] [place], [place] [address]          Radiology Studies: No results found.      Scheduled Meds: . acidophilus  1 capsule Oral Daily  . amLODipine  10 mg Oral Daily  . atropine  1 drop Right Eye TID  . carvedilol  25 mg Oral BID WC  . Chlorhexidine Gluconate Cloth  6 each Topical Q0600  . gabapentin  100 mg Oral Daily  . gabapentin  200 mg Oral QHS  . heparin  5,000 Units Subcutaneous Q8H  .  hydrALAZINE  75 mg Oral Q8H  . insulin aspart  0-6 Units Subcutaneous TID WC  . insulin glargine  5 Units Subcutaneous QHS  . isosorbide dinitrate  20 mg Oral TID  . magnesium oxide  400 mg Oral BID  . sodium chloride flush  3 mL Intravenous Q12H   Continuous Infusions:   LOS: 5 days    Time spent: 34 minutes spent on chart review, discussion with nursing staff, consultants, updating family and interview/physical exam; more than 50% of that time was spent in counseling and/or coordination of care.    [name] Slight J [place] DO [facility] Available via Epic secure chat 7am-7pm After these hours, please refer to coverage provider listed on [email or web address] 06/02/2020, 12:38 PM

## 2020-06-02 NOTE — TOC Initial Note (Signed)
Transition of Care [facility]) - Initial/Assessment Note    Patient Details  Name: [name] [name] MRN: [id number] Date of Birth: 1965/10/02  [facility] CM/SW Contact:    Trula Ore, [place] Phone Number: 06/02/2020, 12:43 PM  Clinical Narrative:                  CSW received consult for possible SNF placement at time of discharge. CSW spoke with patient regarding PT recommendation of SNF placement at time of discharge.  Patient expressed understanding of PT recommendation and  declined SNF placement at time of discharge. Patient wants to go home with home health services .plans are to return home at DC and resume [facility] services w [facility]. She states she is currently active w [facility]. No further questions reported at this time. CSW informed Case Manager. CSW to continue to follow.  Expected Discharge Plan: [place] Barriers to Discharge: Continued Medical Work up   Patient Goals and CMS Choice Patient states their goals for this hospitalization and ongoing recovery are:: to go home with home health services CMS Medicare.gov Compare Post Acute Care list provided to:: Patient Choice offered to / list presented to : Patient  Expected Discharge Plan and Services Expected Discharge Plan: [place] In-house Referral: Clinical Social Work Discharge Planning Services: CM Consult Post Acute Care Choice: [place] arrangements for the past 2 months: Single Family Home                           HH Arranged: PT, OT [place] Agency: [facility]     Representative spoke with at [place]: Could not reach anyone  Prior Living Arrangements/Services Living arrangements for the past 2 months: [place] Lives with:: Self, Spouse Patient language and need for interpreter reviewed:: Yes Do you feel safe going back to the place where you live?: Yes      Need for Family Participation in Patient Care: Yes (Comment) Care giver  support system in place?: Yes (comment)   Criminal Activity/Legal Involvement Pertinent to Current Situation/Hospitalization: No - Comment as needed  Activities of Daily Living      Permission Sought/Granted Permission sought to share information with : Case Manager, Family Supports, Investment banker, corporate granted to share info w AGENCY: [facility]        Emotional Assessment   Attitude/Demeanor/Rapport: Gracious Affect (typically observed): Calm Orientation: : Oriented to Self, Oriented to Place, Oriented to  Time, Oriented to Situation Alcohol / Substance Use: Not Applicable Psych Involvement: No (comment)  Admission diagnosis:  Hypertensive emergency [I16.1] Patient Active Problem List   Diagnosis Date Noted  . Hypertensive emergency   . Pressure injury of skin 03/12/2020  . Sepsis ([place]) 03/07/2020  . ESRD (end stage renal disease) ([place]) 03/07/2020  . Hyperkalemia 03/07/2020  . Acute cystitis 03/07/2020  . Acute metabolic encephalopathy 18/29/9371  . 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 Diabetes mellitus with complication ([place])   . Intractable headache   . Syncope 07/28/2015  . Chronic headaches 07/28/2015  . Chest pain 05/22/2015  . Urinary frequency 03/13/2015  . Easy bruising 10/09/2014  . Encounter for preventative adult health care exam with abnormal findings 01/11/2014  . Chronic constipation 01/11/2014  . GERD (gastroesophageal reflux disease) 01/11/2014  . Helicobacter positive gastritis 12/06/2013  . Dizziness and giddiness 10/12/2013  . Essential hypertension  09/19/2013  . Back pain 06/11/2013  . Lumbosacral spondylosis without myelopathy 02/20/2013  . Diabetic neuropathy ([place]) 01/09/2013  . DM (diabetes mellitus), type 2, uncontrolled ([place]) 10/02/2012  . MITRAL REGURGITATION, mild 10/31/2009  . PLEURAL EFFUSION, RIGHT after pyelonephritis in April 2011 (no intervention) 10/20/2009   PCP:  Myrtis Hopping., MD Pharmacy:    [facility], [place] [address] Phone: [phone] Fax: [phone]     Social Determinants of Health (SDOH) Interventions    Readmission Risk Interventions No flowsheet data found.

## 2020-06-02 NOTE — Plan of Care (Signed)

## 2020-06-03 ENCOUNTER — Encounter (HOSPITAL_COMMUNITY): Payer: Self-pay | Admitting: Internal Medicine

## 2020-06-03 ENCOUNTER — Other Ambulatory Visit (HOSPITAL_COMMUNITY): Payer: Self-pay | Admitting: Internal Medicine

## 2020-06-03 DIAGNOSIS — I161 Hypertensive emergency: Secondary | ICD-10-CM | POA: Diagnosis not present

## 2020-06-03 LAB — GLUCOSE, CAPILLARY
Glucose-Capillary: 131 mg/dL — ABNORMAL HIGH (ref 70–99)
Glucose-Capillary: 112 mg/dL — ABNORMAL HIGH (ref 70–99)
Glucose-Capillary: 217 mg/dL — ABNORMAL HIGH (ref 70–99)
Glucose-Capillary: 138 mg/dL — ABNORMAL HIGH (ref 70–99)
Glucose-Capillary: 206 mg/dL — ABNORMAL HIGH (ref 70–99)

## 2020-06-03 MED ORDER — METHOCARBAMOL 500 MG PO TABS
500.0000 mg | ORAL_TABLET | Freq: Four times a day (QID) | ORAL | 0 refills | Status: DC | PRN
Start: 2020-06-03 — End: 2020-06-03

## 2020-06-03 MED ORDER — SUMATRIPTAN SUCCINATE 100 MG PO TABS: 100.0000 mg | ORAL_TABLET | Freq: Once | ORAL | 0 refills | Status: DC | PRN

## 2020-06-03 MED ORDER — HYDRALAZINE HCL 25 MG PO TABS: 75.0000 mg | ORAL_TABLET | Freq: Three times a day (TID) | ORAL | 0 refills | Status: DC

## 2020-06-03 MED FILL — SUMATRIPTAN SUCC 100 MG TAB: 100 | 28 days supply | Qty: 9 | Fill #0

## 2020-06-03 MED FILL — METHOCARBAMOL 500 MG TABS: 500 | 5 days supply | Qty: 20 | Fill #0

## 2020-06-03 MED FILL — hydrALAZINE HCL 25 MG TABS: 25 | 30 days supply | Qty: 270 | Fill #0

## 2020-06-03 NOTE — Plan of Care (Signed)
?  Problem: Coping: ?Goal: Level of anxiety will decrease ?Outcome: Progressing ?  ?Problem: Safety: ?Goal: Ability to remain free from injury will improve ?Outcome: Progressing ?  ?

## 2020-06-03 NOTE — Discharge Instructions (Signed)
Hypertension, Adult Hypertension is another name for high blood pressure. High blood pressure forces your heart to work harder to pump blood. This can cause problems over time. There are two numbers in a blood pressure reading. There is a top number (systolic) over a bottom number (diastolic). It is best to have a blood pressure that is below 120/80. Healthy choices can help lower your blood pressure, or you may need medicine to help lower it. What are the causes? The cause of this condition is not known. Some conditions may be related to high blood pressure. What increases the risk?  Smoking.  Having type 2 diabetes mellitus, high cholesterol, or both.  Not getting enough exercise or physical activity.  Being overweight.  Having too much fat, sugar, calories, or salt (sodium) in your diet.  Drinking too much alcohol.  Having long-term (chronic) kidney disease.  Having a family history of high blood pressure.  Age. Risk increases with age.  Race. You may be at higher risk if you are African American.  Gender. Men are at higher risk than women before age 45. After age 65, women are at higher risk than men.  Having obstructive sleep apnea.  Stress. What are the signs or symptoms?  High blood pressure may not cause symptoms. Very high blood pressure (hypertensive crisis) may cause: ? Headache. ? Feelings of worry or nervousness (anxiety). ? Shortness of breath. ? Nosebleed. ? A feeling of being sick to your stomach (nausea). ? Throwing up (vomiting). ? Changes in how you see. ? Very bad chest pain. ? Seizures. How is this treated?  This condition is treated by making healthy lifestyle changes, such as: ? Eating healthy foods. ? Exercising more. ? Drinking less alcohol.  Your health care provider may prescribe medicine if lifestyle changes are not enough to get your blood pressure under control, and if: ? Your top number is above 130. ? Your bottom number is above  80.  Your personal target blood pressure may vary. Follow these instructions at home: Eating and drinking   If told, follow the DASH eating plan. To follow this plan: ? Fill one half of your plate at each meal with fruits and vegetables. ? Fill one fourth of your plate at each meal with whole grains. Whole grains include whole-wheat pasta, brown rice, and whole-grain bread. ? Eat or drink low-fat dairy products, such as skim milk or low-fat yogurt. ? Fill one fourth of your plate at each meal with low-fat (lean) proteins. Low-fat proteins include fish, chicken without skin, eggs, beans, and tofu. ? Avoid fatty meat, cured and processed meat, or chicken with skin. ? Avoid pre-made or processed food.  Eat less than 1,500 mg of salt each day.  Do not drink alcohol if: ? Your doctor tells you not to drink. ? You are pregnant, may be pregnant, or are planning to become pregnant.  If you drink alcohol: ? Limit how much you use to:  0-1 drink a day for women.  0-2 drinks a day for men. ? Be aware of how much alcohol is in your drink. In the [place] one drink equals one 12 oz bottle of beer (355 mL), one 5 oz glass of wine (148 mL), or one 1 oz glass of hard liquor (44 mL). Lifestyle   Work with your doctor to stay at a healthy weight or to lose weight. Ask your doctor what the best weight is for you.  Get at least 30 minutes of exercise most   days of the week. This may include walking, swimming, or biking.  Get at least 30 minutes of exercise that strengthens your muscles (resistance exercise) at least 3 days a week. This may include lifting weights or doing Pilates.  Do not use any products that contain nicotine or tobacco, such as cigarettes, e-cigarettes, and chewing tobacco. If you need help quitting, ask your doctor.  Check your blood pressure at home as told by your doctor.  Keep all follow-up visits as told by your doctor. This is important. Medicines  Take over-the-counter  and prescription medicines only as told by your doctor. Follow directions carefully.  Do not skip doses of blood pressure medicine. The medicine does not work as well if you skip doses. Skipping doses also puts you at risk for problems.  Ask your doctor about side effects or reactions to medicines that you should watch for. Contact a doctor if you:  Think you are having a reaction to the medicine you are taking.  Have headaches that keep coming back (recurring).  Feel dizzy.  Have swelling in your ankles.  Have trouble with your vision. Get help right away if you:  Get a very bad headache.  Start to feel mixed up (confused).  Feel weak or numb.  Feel faint.  Have very bad pain in your: ? Chest. ? Belly (abdomen).  Throw up more than once.  Have trouble breathing. Summary  Hypertension is another name for high blood pressure.  High blood pressure forces your heart to work harder to pump blood.  For most people, a normal blood pressure is less than 120/80.  Making healthy choices can help lower blood pressure. If your blood pressure does not get lower with healthy choices, you may need to take medicine. This information is not intended to replace advice given to you by your health care provider. Make sure you discuss any questions you have with your health care provider. Document Revised: 03/01/2018 Document Reviewed: 03/01/2018 Elsevier Patient Education  2020 [place] Your Hypertension Hypertension is commonly called high blood pressure. This is when the force of your blood pressing against the walls of your arteries is too strong. Arteries are blood vessels that carry blood from your heart throughout your body. Hypertension forces the heart to work harder to pump blood, and may cause the arteries to become narrow or stiff. Having untreated or uncontrolled hypertension can cause heart attack, stroke, kidney disease, and other problems. What are blood  pressure readings? A blood pressure reading consists of a higher number over a lower number. Ideally, your blood pressure should be below 120/80. The first ("top") number is called the systolic pressure. It is a measure of the pressure in your arteries as your heart beats. The second ("bottom") number is called the diastolic pressure. It is a measure of the pressure in your arteries as the heart relaxes. What does my blood pressure reading mean? Blood pressure is classified into four stages. Based on your blood pressure reading, your health care provider may use the following stages to determine what type of treatment you need, if any. Systolic pressure and diastolic pressure are measured in a unit called mm Hg. Normal  Systolic pressure: below 127.  Diastolic pressure: below 80. Elevated  Systolic pressure: 517-001.  Diastolic pressure: below 80. Hypertension stage 1  Systolic pressure: 749-449.  Diastolic pressure: 67-59. Hypertension stage 2  Systolic pressure: 163 or above.  Diastolic pressure: 90 or above. What health risks are associated  with hypertension? Managing your hypertension is an important responsibility. Uncontrolled hypertension can lead to:  A heart attack.  A stroke.  A weakened blood vessel (aneurysm).  Heart failure.  Kidney damage.  Eye damage.  Metabolic syndrome.  Memory and concentration problems. What changes can I make to manage my hypertension? Hypertension can be managed by making lifestyle changes and possibly by taking medicines. Your health care provider will help you make a plan to bring your blood pressure within a normal range. Eating and drinking   Eat a diet that is high in fiber and potassium, and low in salt (sodium), added sugar, and fat. An example eating plan is called the DASH (Dietary Approaches to Stop Hypertension) diet. To eat this way: ? Eat plenty of fresh fruits and vegetables. Try to fill half of your plate at each  meal with fruits and vegetables. ? Eat whole grains, such as whole wheat pasta, brown rice, or whole grain bread. Fill about one quarter of your plate with whole grains. ? Eat low-fat diary products. ? Avoid fatty cuts of meat, processed or cured meats, and poultry with skin. Fill about one quarter of your plate with lean proteins such as fish, chicken without skin, beans, eggs, and tofu. ? Avoid premade and processed foods. These tend to be higher in sodium, added sugar, and fat.  Reduce your daily sodium intake. Most people with hypertension should eat less than 1,500 mg of sodium a day.  Limit alcohol intake to no more than 1 drink a day for nonpregnant women and 2 drinks a day for men. One drink equals 12 oz of beer, 5 oz of wine, or 1 oz of hard liquor. Lifestyle  Work with your health care provider to maintain a healthy body weight, or to lose weight. Ask what an ideal weight is for you.  Get at least 30 minutes of exercise that causes your heart to beat faster (aerobic exercise) most days of the week. Activities may include walking, swimming, or biking.  Include exercise to strengthen your muscles (resistance exercise), such as weight lifting, as part of your weekly exercise routine. Try to do these types of exercises for 30 minutes at least 3 days a week.  Do not use any products that contain nicotine or tobacco, such as cigarettes and e-cigarettes. If you need help quitting, ask your health care provider.  Control any long-term (chronic) conditions you have, such as high cholesterol or diabetes. Monitoring  Monitor your blood pressure at home as told by your health care provider. Your personal target blood pressure may vary depending on your medical conditions, your age, and other factors.  Have your blood pressure checked regularly, as often as told by your health care provider. Working with your health care provider  Review all the medicines you take with your health care  provider because there may be side effects or interactions.  Talk with your health care provider about your diet, exercise habits, and other lifestyle factors that may be contributing to hypertension.  Visit your health care provider regularly. Your health care provider can help you create and adjust your plan for managing hypertension. Will I need medicine to control my blood pressure? Your health care provider may prescribe medicine if lifestyle changes are not enough to get your blood pressure under control, and if:  Your systolic blood pressure is 130 or higher.  Your diastolic blood pressure is 80 or higher. Take medicines only as told by your health care provider. Follow  the directions carefully. Blood pressure medicines must be taken as prescribed. The medicine does not work as well when you skip doses. Skipping doses also puts you at risk for problems. Contact a health care provider if:  You think you are having a reaction to medicines you have taken.  You have repeated (recurrent) headaches.  You feel dizzy.  You have swelling in your ankles.  You have trouble with your vision. Get help right away if:  You develop a severe headache or confusion.  You have unusual weakness or numbness, or you feel faint.  You have severe pain in your chest or abdomen.  You vomit repeatedly.  You have trouble breathing. Summary  Hypertension is when the force of blood pumping through your arteries is too strong. If this condition is not controlled, it may put you at risk for serious complications.  Your personal target blood pressure may vary depending on your medical conditions, your age, and other factors. For most people, a normal blood pressure is less than 120/80.  Hypertension is managed by lifestyle changes, medicines, or both. Lifestyle changes include weight loss, eating a healthy, low-sodium diet, exercising more, and limiting alcohol. This information is not intended to  replace advice given to you by your health care provider. Make sure you discuss any questions you have with your health care provider. Document Revised: 10/13/2018 Document Reviewed: 05/19/2016 Elsevier Patient Education  [place].  Migraine Headache A migraine headache is an intense, throbbing pain on one side or both sides of the head. Migraine headaches may also cause other symptoms, such as nausea, vomiting, and sensitivity to light and noise. A migraine headache can last from 4 hours to 3 days. Talk with your doctor about what things may bring on (trigger) your migraine headaches. What are the causes? The exact cause of this condition is not known. However, a migraine may be caused when nerves in the brain become irritated and release chemicals that cause inflammation of blood vessels. This inflammation causes pain. This condition may be triggered or caused by:  Drinking alcohol.  Smoking.  Taking medicines, such as: ? Medicine used to treat chest pain (nitroglycerin). ? Birth control pills. ? Estrogen. ? Certain blood pressure medicines.  Eating or drinking products that contain nitrates, glutamate, aspartame, or tyramine. Aged cheeses, chocolate, or caffeine may also be triggers.  Doing physical activity. Other things that may trigger a migraine headache include:  Menstruation.  Pregnancy.  Hunger.  Stress.  Lack of sleep or too much sleep.  Weather changes.  Fatigue. What increases the risk? The following factors may make you more likely to experience migraine headaches:  Being a certain age. This condition is more common in people who are 66-65 years old.  Being female.  Having a family history of migraine headaches.  Being Caucasian.  Having a mental health condition, such as depression or anxiety.  Being obese. What are the signs or symptoms? The main symptom of this condition is pulsating or throbbing pain. This pain may:  Happen in any area  of the head, such as on one side or both sides.  Interfere with daily activities.  Get worse with physical activity.  Get worse with exposure to bright lights or loud noises. Other symptoms may include:  Nausea.  Vomiting.  Dizziness.  General sensitivity to bright lights, loud noises, or smells. Before you get a migraine headache, you may get warning signs (an aura). An aura may include:  Seeing flashing lights or  having blind spots.  Seeing bright spots, halos, or zigzag lines.  Having tunnel vision or blurred vision.  Having numbness or a tingling feeling.  Having trouble talking.  Having muscle weakness. Some people have symptoms after a migraine headache (postdromal phase), such as:  Feeling tired.  Difficulty concentrating. How is this diagnosed? A migraine headache can be diagnosed based on:  Your symptoms.  A physical exam.  Tests, such as: ? CT scan or an MRI of the head. These imaging tests can help rule out other causes of headaches. ? Taking fluid from the spine (lumbar puncture) and analyzing it (cerebrospinal fluid analysis, or CSF analysis). How is this treated? This condition may be treated with medicines that:  Relieve pain.  Relieve nausea.  Prevent migraine headaches. Treatment for this condition may also include:  Acupuncture.  Lifestyle changes like avoiding foods that trigger migraine headaches.  Biofeedback.  Cognitive behavioral therapy. Follow these instructions at home: Medicines  Take over-the-counter and prescription medicines only as told by your health care provider.  Ask your health care provider if the medicine prescribed to you: ? Requires you to avoid driving or using heavy machinery. ? Can cause constipation. You may need to take these actions to prevent or treat constipation:  Drink enough fluid to keep your urine pale yellow.  Take over-the-counter or prescription medicines.  Eat foods that are high in  fiber, such as beans, whole grains, and fresh fruits and vegetables.  Limit foods that are high in fat and processed sugars, such as fried or sweet foods. Lifestyle  Do not drink alcohol.  Do not use any products that contain nicotine or tobacco, such as cigarettes, e-cigarettes, and chewing tobacco. If you need help quitting, ask your health care provider.  Get at least 8 hours of sleep every night.  Find ways to manage stress, such as meditation, deep breathing, or yoga. General instructions      Keep a journal to find out what may trigger your migraine headaches. For example, write down: ? What you eat and drink. ? How much sleep you get. ? Any change to your diet or medicines.  If you have a migraine headache: ? Avoid things that make your symptoms worse, such as bright lights. ? It may help to lie down in a dark, quiet room. ? Do not drive or use heavy machinery. ? Ask your health care provider what activities are safe for you while you are experiencing symptoms.  Keep all follow-up visits as told by your health care provider. This is important. Contact a health care provider if:  You develop symptoms that are different or more severe than your usual migraine headache symptoms.  You have more than 15 headache days in one month. Get help right away if:  Your migraine headache becomes severe.  Your migraine headache lasts longer than 72 hours.  You have a fever.  You have a stiff neck.  You have vision loss.  Your muscles feel weak or like you cannot control them.  You start to lose your balance often.  You have trouble walking.  You faint.  You have a seizure. Summary  A migraine headache is an intense, throbbing pain on one side or both sides of the head. Migraines may also cause other symptoms, such as nausea, vomiting, and sensitivity to light and noise.  This condition may be treated with medicines and lifestyle changes. You may also need to avoid  certain things that trigger a migraine headache.  Keep a journal to find out what may trigger your migraine headaches.  Contact your health care provider if you have more than 15 headache days in a month or you develop symptoms that are different or more severe than your usual migraine headache symptoms. This information is not intended to replace advice given to you by your health care provider. Make sure you discuss any questions you have with your health care provider. Document Revised: 10/13/2018 Document Reviewed: 08/03/2018 Elsevier Patient Education  [place].  Tension Headache, Adult A tension headache is a feeling of pain, pressure, or aching in the head that is often felt over the front and sides of the head. The pain can be dull, or it can feel tight (constricting). There are two types of tension headache:  Episodic tension headache. This is when the headaches happen fewer than 15 days a month.  Chronic tension headache. This is when the headaches happen more than 15 days a month during a 64-month period. A tension headache can last from 30 minutes to several days. It is the most common kind of headache. Tension headaches are not normally associated with nausea or vomiting, and they do not get worse with physical activity. What are the causes? The exact cause of this condition is not known. Tension headaches are often triggered by stress, anxiety, or depression. Other triggers include:  Alcohol.  Too much caffeine or caffeine withdrawal.  Respiratory infections, such as colds, flu, or sinus infections.  Dental problems or teeth clenching.  Tiredness (fatigue).  Holding your head and neck in the same position for a long period of time, such as while using a computer.  Smoking.  Arthritis of the neck. What are the signs or symptoms? Symptoms of this condition include:  A feeling of pressure or tightness around the head.  Dull, aching head pain.  Pain over  the front and sides of the head.  Tenderness in the muscles of the head, neck, and shoulders. How is this diagnosed? This condition may be diagnosed based on your symptoms, your medical history, and a physical exam. If your symptoms are severe or unusual, you may have imaging tests, such as a CT scan or an MRI of your head. Your vision may also be checked. How is this treated? This condition may be treated with lifestyle changes and with medicines that help relieve symptoms. Follow these instructions at home: Managing pain  Take over-the-counter and prescription medicines only as told by your health care provider.  When you have a headache, lie down in a dark, quiet room.  If directed, apply ice to the head and neck: ? Put ice in a plastic bag. ? Place a towel between your skin and the bag. ? Leave the ice on for 20 minutes, 2-3 times a day.  If directed, apply heat to the back of your neck as often as told by your health care provider. Use the heat source that your health care provider recommends, such as a moist heat pack or a heating pad. ? Place a towel between your skin and the heat source. ? Leave the heat on for 20-30 minutes. ? Remove the heat if your skin turns bright red. This is especially important if you are unable to feel pain, heat, or cold. You may have a greater risk of getting burned. Eating and drinking  Eat meals on a regular schedule.  Limit alcohol intake to no more than 1 drink a day for nonpregnant women and 2  drinks a day for men. One drink equals 12 oz of beer, 5 oz of wine, or 1 oz of hard liquor.  Drink enough fluid to keep your urine pale yellow.  Decrease your caffeine intake, or stop using caffeine. Lifestyle  Get 7-9 hours of sleep each night, or get the amount of sleep recommended by your health care provider.  At bedtime, remove all electronic devices from your room. Electronic devices include computers, phones, and tablets.  Find ways to  manage your stress. Some things that can help relieve stress include: ? Exercise. ? Deep breathing exercises. ? Yoga. ? Listening to music. ? Positive mental imagery.  Try to sit up straight and avoid tensing your muscles.  Do not use any products that contain nicotine or tobacco, such as cigarettes and e-cigarettes. If you need help quitting, ask your health care provider. General instructions   Keep all follow-up visits as told by your health care provider. This is important.  Avoid any headache triggers. Keep a headache journal to help find out what may trigger your headaches. For example, write down: ? What you eat and drink. ? How much sleep you get. ? Any change to your diet or medicines. Contact a health care provider if:  Your headache does not get better.  Your headache comes back.  You are sensitive to sounds, light, or smells because of a headache.  You have nausea or you vomit.  Your stomach hurts. Get help right away if:  You suddenly develop a very severe headache along with any of the following: ? A stiff neck. ? Nausea and vomiting. ? Confusion. ? Weakness. ? Double vision or loss of vision. ? Shortness of breath. ? Rash. ? Unusual sleepiness. ? Fever. ? Trouble speaking. ? Pain in your eyes or ears. ? Trouble walking or balancing. ? Feeling faint or passing out. Summary  A tension headache is a feeling of pain, pressure, or aching in the head that is often felt over the front and sides of the head.  A tension headache can last from 30 minutes to several days. It is the most common kind of headache.  This condition may be diagnosed based on your symptoms, your medical history, and a physical exam.  This condition may be treated with lifestyle changes and with medicines that help relieve symptoms. This information is not intended to replace advice given to you by your health care provider. Make sure you discuss any questions you have with your  health care provider. Document Revised: 04/18/2019 Document Reviewed: 10/01/2016 Elsevier Patient Education  [place].

## 2020-06-03 NOTE — Discharge Summary (Signed)
Physician Discharge Summary  TANEQUA KRETZ TMH:[id number] DOB: [birth year] DOA: 05/27/2020  PCP: Myrtis Hopping., MD  Admit date: 05/27/2020 Discharge date: 06/03/2020  Admitted From: Home Disposition: Home  Recommendations for Outpatient Follow-up:  1. Follow up with PCP in 1-2 weeks 2. Follow-up with nephrology as scheduled 3. Home hydralazine uptitrated to 75 mg every 8 hours for hypertensive emergency 4. Follow-up blood pressure log at next PCP visit, may need further up titration of hydralazine versus starting clonidine patch  Home Health: PT/OT Equipment/Devices: None  Discharge Condition: Stable CODE STATUS: Full code Diet recommendation: Renal diet, 1200 mL fluid restriction  History of present illness:  [name] [name] is a 54 year old female with past medical history notable for ESRD on HD MWF, hyperlipidemia, type 2 diabetes mellitus, GERD, essential hypertension, neuropathy who presented to the ED with headache, blurred vision and nausea with vomiting.  Patient with Gwyndolyn Saxon hypertension with poor control despite multiple antihypertensive medications, although history of medication noncompliance.  Patient reports elevated blood pressure over the past 2 weeks.  Patient currently on a holiday schedule for HD, last HD Sunday.  Was supposed to do HD on Tuesday but was directed to the ED due to elevated blood pressure.  In the ED, BP 245/112.  CT head without acute changes.  Chest x-ray with cardiomegaly.  Critical care medicine was consulted and recommend admission to hospital service.  Hospitalist service consulted for further evaluation and management.  Hospital course:  Hypertensive emergency History of essential/malignant HTN Patient presenting to the ED with 2-week history of poorly controlled hypertension associated with headache, visual changes and nausea/vomiting.  Blood pressure on arrival 245/112.  CT head with no acute intracranial abnormality.  MR brain unrevealing.   Initially started on Cleviprex drip which was weaned off.  PCCM consulted, no further recommendations; no need for ICU admission. Troponin 17, normal. TTE with LVEF 92-11%, grade 2 diastolic dysfunction, trivial MR, IVC normal in size.  Continued on home amlodipine 10 mg p.o. daily, carvedilol 25 mg p.o. twice daily, isosorbide dinitrate 20 mg p.o. 3 times daily.  Home hydralazine was uptitrated to 7 5 mg p.o. every 8 hours with adequate blood pressure control.  Recommend follow-up with PCP in 1-2 weeks for review of BP log which patient instructed to maintain.  If needs further adjustments may increase hydralazine to max dose versus addition of clonidine patch.  ESRD on HD MWF Patient dialyzes via left chest permacath.  Currently undergoes HD on MWF schedule at [facility]. Nephrology consulted for continued HD while inpatient; resume home Monday/Wednesday/Friday schedule on discharge.  Type 2 diabetes mellitus Home regimen includes Lantus 10 units subcutaneously daily, Humalog insulin sliding scale. Hemoglobin A1c 5.8 on 03/09/2020, well controlled.   Peripheral neuropathy Gabapentin 100mg  daily and 200mg  qHS  Headache secondary to migraine versus tension headache Patient reports increased stress with her husband and currently being separated.  Patient seeking apartment assistance to move out.  Likely etiology of her symptoms related to the stress and tension headache.  Will give trial of Robaxin and Imitrex as needed.  Outpatient follow-up with PCP.  Multiple falls, weakness, debility, deconditioning Patient recently discharged from [facility] 03/15/2020 to [facility], SNF, discharge less than 2 weeks.  Poor support at home with husband.  Continues with multiple falls at home due to weakness and requires assistance for all ADLs.    Followed by PT/OT during hospitalization and will resume home health on discharge.  Patient has scheduled appointment for apartment assistance  on  Thursday.   Discharge Diagnoses:  Active Problems:   DM (diabetes mellitus), type 2, uncontrolled (HCC)   Diabetic neuropathy ([place])   Essential hypertension   Diabetes mellitus with complication ([place])   ESRD (end stage renal disease) [facility])    Discharge Instructions  Discharge Instructions    Call MD for:  difficulty breathing, headache or visual disturbances   Complete by: As directed    Call MD for:  extreme fatigue   Complete by: As directed    Call MD for:  persistant dizziness or light-headedness   Complete by: As directed    Call MD for:  persistant nausea and vomiting   Complete by: As directed    Call MD for:  severe uncontrolled pain   Complete by: As directed    Call MD for:  temperature >100.4   Complete by: As directed    Diet - low sodium heart healthy   Complete by: As directed    Increase activity slowly   Complete by: As directed      Allergies as of 06/03/2020      Reactions   Amitriptyline Swelling   Other reaction(s): Confusion (intolerance)   Ciprofloxacin Diarrhea, Nausea And Vomiting   VOMITING Severe vomiting requiring ED visit, IV reglan and IV fluids VOMITING   Semaglutide Nausea Only   Tramadol Itching   Tylenol [acetaminophen] Itching      Medication List    TAKE these medications   amLODipine 10 MG tablet Commonly known as: NORVASC Take 10 mg by mouth daily.   atropine 1 % ophthalmic solution Place 1 drop into the right eye 3 (three) times daily.   B-complex with vitamin C tablet Take 1 tablet by mouth daily.   bisacodyl 10 MG suppository Commonly known as: DULCOLAX Place 10 mg rectally daily as needed for moderate constipation.   carvedilol 25 MG tablet Commonly known as: COREG Take 25 mg by mouth 2 (two) times daily.   gabapentin 100 MG capsule Commonly known as: NEURONTIN Take 200 mg by mouth at bedtime.   gabapentin 100 MG capsule Commonly known as: NEURONTIN Take 100 mg by mouth daily.   hydrALAZINE 25 MG  tablet Commonly known as: APRESOLINE Take 3 tablets (75 mg total) by mouth every 8 (eight) hours. What changed:   medication strength  how much to take  when to take this  reasons to take this   Insulin Glargine 300 UNIT/ML Sopn Inject 13 Units into the skin daily. What changed: how much to take   insulin lispro 100 UNIT/ML injection Commonly known as: HUMALOG Inject 2-14 Units into the skin daily. Per sliding scale: BG  201-250= 2 units, 251-300 = 4 units, 301-350= 6 units, 351-400= 8 units, 401-450= 10 units, 451-500 = 12 units, 500 or greater = 14 units.   isosorbide dinitrate 20 MG tablet Commonly known as: ISORDIL Take 20 mg by mouth 3 (three) times daily.   LACTOBACILLUS EXTRA STRENGTH PO Take 1 capsule by mouth daily.   Magnesium 400 MG Tabs Take 400 mg by mouth 2 (two) times daily.   methocarbamol 500 MG tablet Commonly known as: ROBAXIN Take 1 tablet (500 mg total) by mouth every 6 (six) hours as needed for muscle spasms (headache).   oxyCODONE 5 MG immediate release tablet Commonly known as: Roxicodone Take 1 tablet (5 mg total) by mouth every 6 (six) hours as needed for severe pain.   polyethylene glycol 17 g packet Commonly known as: MIRALAX / GLYCOLAX Take  17 g by mouth daily as needed for mild constipation.   sennosides-docusate sodium 8.6-50 MG tablet Commonly known as: SENOKOT-S Take 1 tablet by mouth daily as needed for constipation.   SUMAtriptan 100 MG tablet Commonly known as: IMITREX Take 1 tablet (100 mg total) by mouth once as needed for migraine. May repeat in 2 hours if headache persists or recurs.   vitamin C 100 MG tablet Take 100 mg by mouth daily.            Durable Medical Equipment  (From admission, onward)         Start     Ordered   06/01/20 1704  For home use only DME 3 n 1  Once        06/01/20 1703          Follow-up Information    [facility].   Specialty: Home Health Services Why: For  home health services. They will call you in the next 1-2 days to restart your home health services. (RN/PT/OT/SW) Contact information: [place] [address] [phone]        Myrtis Hopping., MD. Schedule an appointment as soon as possible for a visit in 1 week(s).   Specialty: Internal Medicine Contact information: [address] [place] [address] [phone]              Allergies  Allergen Reactions  . Amitriptyline Swelling    Other reaction(s): Confusion (intolerance)  . Ciprofloxacin Diarrhea and Nausea And Vomiting    VOMITING Severe vomiting requiring ED visit, IV reglan and IV fluids VOMITING   . Semaglutide Nausea Only  . Tramadol Itching  . Tylenol [Acetaminophen] Itching    Consultations:  Nephrology   Procedures/Studies: CT Head Wo Contrast  Result Date: 05/27/2020 CLINICAL DATA:  Headache, intracranial hemorrhage suspected. Hypertension. EXAM: CT HEAD WITHOUT CONTRAST TECHNIQUE: Contiguous axial images were obtained from the base of the skull through the vertex without intravenous contrast. COMPARISON:  CT head 04/29/2020. FINDINGS: Brain: No evidence of large-territorial acute infarction. No parenchymal hemorrhage. No mass lesion. No extra-axial collection. No mass effect or midline shift. No hydrocephalus. Basilar cisterns are patent. Vascular: No hyperdense vessel. Atherosclerotic calcifications are present within the cavernous internal carotid arteries. Skull: No acute fracture or focal lesion. Sinuses/Orbits: Right sphenoid sinus mucosal thickening. Otherwise paranasal sinuses and mastoid air cells are clear. The orbits are unremarkable. Other: None. IMPRESSION: No acute intracranial abnormality. Electronically Signed   By: Iven Finn M.D.   On: 05/27/2020 18:57   MR BRAIN WO CONTRAST  Result Date: 05/28/2020 CLINICAL DATA:  Blurry vision and nausea EXAM: MRI HEAD WITHOUT CONTRAST TECHNIQUE:  Multiplanar, multiecho pulse sequences of the brain and surrounding structures were obtained without intravenous contrast. COMPARISON:  Brain MRI 06/04/2019 FINDINGS: Brain: No acute infarct, acute hemorrhage or extra-axial collection. Normal white matter signal. Normal volume of CSF spaces. No chronic microhemorrhage. Normal midline structures. Vascular: Major flow voids are preserved. Skull and upper cervical spine: Normal calvarium and skull base. Visualized upper cervical spine and soft tissues are normal. Sinuses/Orbits:No paranasal sinus fluid levels or advanced mucosal thickening. No mastoid or middle ear effusion. Normal orbits. IMPRESSION: Normal brain MRI. Electronically Signed   By: Ulyses Jarred M.D.   On: 05/28/2020 01:07   DG CHEST PORT 1 VIEW  Result Date: 05/27/2020 CLINICAL DATA:  Headache, hypertension, blurred vision, and nausea for the past week. EXAM: PORTABLE CHEST 1 VIEW COMPARISON:  03/07/2020 FINDINGS: Dialysis catheter with tip over the cavoatrial junction region. Mild cardiac enlargement. No vascular congestion, edema, or consolidation. No pleural effusions. No pneumothorax. Mediastinal contours appear intact. Surgical clips in the right upper quadrant. IMPRESSION: Mild cardiac enlargement. Electronically Signed   By: Lucienne Capers M.D.   On: 05/27/2020 23:48   ECHOCARDIOGRAM COMPLETE  Result Date: 05/29/2020    ECHOCARDIOGRAM REPORT   Patient Name:   GERMANY DODGEN Date of Exam: 05/29/2020 Medical Rec #:  [id number]     Height:       60.0 in Accession #:    [id number]    Weight:       159.4 lb Date of Birth:  [birth year]     BSA:          1.695 m Patient Age:    54 years      BP:           162/81 mmHg Patient Gender: F             HR:           64 bpm. Exam Location:  Inpatient Procedure: 2D Echo, Color Doppler and Cardiac Doppler Indications:    Hypertensive Emergency  History:        Patient has prior history of Echocardiogram examinations, most                 recent  07/29/2015. Risk Factors:Hypertension and Diabetes.  Sonographer:    Raquel Sarna Senior RDCS Referring Phys: [id number] [place]  1. Left ventricular ejection fraction, by estimation, is 60 to 65%. The left ventricle has normal function. The left ventricle has no regional wall motion abnormalities. There is moderate left ventricular hypertrophy. Left ventricular diastolic parameters are consistent with Grade II diastolic dysfunction (pseudonormalization). Elevated left atrial pressure.  2. Right ventricular systolic function is normal. The right ventricular size is normal. There is mildly elevated pulmonary artery systolic pressure.  3. The mitral valve is normal in structure. Trivial mitral valve regurgitation.  4. The aortic valve is tricuspid. Aortic valve regurgitation is not visualized. Mild aortic valve sclerosis is present, with no evidence of aortic valve stenosis.  5. The inferior vena cava is normal in size with <50% respiratory variability, suggesting right atrial pressure of 8 mmHg. FINDINGS  Left Ventricle: Left ventricular ejection fraction, by estimation, is 60 to 65%. The left ventricle has normal function. The left ventricle has no regional wall motion abnormalities. The left ventricular internal cavity size was small. There is moderate  left ventricular hypertrophy. Left ventricular diastolic parameters are consistent with Grade II diastolic dysfunction (pseudonormalization). Elevated left atrial pressure. Right Ventricle: The right ventricular size is normal. Right vetricular wall thickness was not assessed. Right ventricular systolic function is normal. There is mildly elevated pulmonary artery systolic pressure. The tricuspid regurgitant velocity is 2.78 m/s, and with an assumed right atrial pressure of 8 mmHg, the estimated right ventricular systolic pressure is 25.8 mmHg. Left Atrium: Left atrial size was normal in size. Right Atrium: Right atrial size was normal in size.  Pericardium: There is no evidence of pericardial effusion. Mitral Valve: The mitral valve is normal in structure. Trivial mitral valve regurgitation. Tricuspid Valve: The tricuspid valve is normal in structure. Tricuspid valve regurgitation is trivial. Aortic Valve: The aortic valve is tricuspid. Aortic valve regurgitation is not visualized. Mild aortic valve sclerosis is present, with no evidence of aortic valve stenosis. Pulmonic Valve: The pulmonic valve was not well visualized. Pulmonic valve  regurgitation is not visualized. Aorta: The aortic root is normal in size and structure. Venous: The inferior vena cava is normal in size with less than 50% respiratory variability, suggesting right atrial pressure of 8 mmHg. IAS/Shunts: The interatrial septum was not well visualized.  LEFT VENTRICLE PLAX 2D LVIDd:         3.50 cm  Diastology LVIDs:         2.60 cm  LV e' medial:    5.00 cm/s LV PW:         1.10 cm  LV E/e' medial:  15.8 LV IVS:        1.20 cm  LV e' lateral:   6.53 cm/s LVOT diam:     1.90 cm  LV E/e' lateral: 12.1 LV SV:         71 LV SV Index:   42 LVOT Area:     2.84 cm  RIGHT VENTRICLE RV S prime:     8.81 cm/s TAPSE (M-mode): 1.9 cm LEFT ATRIUM             Index       RIGHT ATRIUM           Index LA diam:        2.60 cm 1.53 cm/m  RA Area:     11.40 cm LA Vol (A2C):   39.8 ml 23.48 ml/m RA Volume:   23.00 ml  13.57 ml/m LA Vol (A4C):   41.6 ml 24.54 ml/m LA Biplane Vol: 42.2 ml 24.90 ml/m  AORTIC VALVE LVOT Vmax:   106.00 cm/s LVOT Vmean:  74.800 cm/s LVOT VTI:    0.250 m  AORTA Ao Root diam: 2.70 cm Ao Asc diam:  2.70 cm MITRAL VALVE               TRICUSPID VALVE MV Area (PHT): 3.12 cm    TR Peak grad:   30.9 mmHg MV Decel Time: 243 msec    TR Vmax:        278.00 cm/s MV E velocity: 78.80 cm/s MV A velocity: 94.30 cm/s  SHUNTS MV E/A ratio:  0.84        Systemic VTI:  0.25 m                            Systemic Diam: 1.90 cm Oswaldo Milian MD Electronically signed by Oswaldo Milian  MD Signature Date/Time: 05/29/2020/1:23:32 PM    Final       Subjective: Patient seen and examined bedside, resting comfortably.  Continues with intermittent headaches.  Blood pressure remains well controlled over the last 3-4 days on current regimen.  No other complaints or concerns at this time.  Denies fever/chills/night sweats, no visual changes, no chest pain, no palpitations, no shortness of breath, no abdominal pain, no weakness, no fatigue, no paresthesias.  No acute events overnight per nursing staff.  Discharge Exam: Vitals:   06/03/20 0145 06/03/20 0600  BP: (!) 164/77 117/66  Pulse:  61  Resp: 18 15  Temp: 97.9 F (36.6 C) 98.2 F (36.8 C)  SpO2:  97%   Vitals:   06/03/20 0026 06/03/20 0041 06/03/20 0145 06/03/20 0600  BP: (!) 157/73 (!) 176/86 (!) 164/77 117/66  Pulse: 60   61  Resp: (!) 8 16 18 15   Temp: 98 F (36.7 C)  97.9 F (36.6 C) 98.2 F (36.8 C)  TempSrc: Oral  Oral Oral  SpO2: 98% 98%  97%  Weight:    75.4 kg    General: Pt is alert, awake, not in acute distress Cardiovascular: RRR, S1/S2 +, no rubs, no gallops, noted tunneled HD catheter with dressing in place, C/D/I. Respiratory: CTA bilaterally, no wheezing, no rhonchi, oxygenating well on room air Abdominal: Soft, NT, ND, bowel sounds + Extremities: no edema, no cyanosis    The results of significant diagnostics from this hospitalization (including imaging, microbiology, ancillary and laboratory) are listed below for reference.     Microbiology: Recent Results (from the past 240 hour(s))  Respiratory Panel by RT PCR (Flu A&B, Covid) - Nasopharyngeal Swab     Status: None   Collection Time: 05/27/20 10:13 PM   Specimen: Nasopharyngeal Swab; Nasopharyngeal(NP) swabs in vial transport medium  Result Value Ref Range Status   SARS Coronavirus 2 by RT PCR NEGATIVE NEGATIVE Final    Comment: (NOTE) SARS-CoV-2 target nucleic acids are NOT DETECTED.  The SARS-CoV-2 RNA is generally detectable in  upper respiratoy specimens during the acute phase of infection. The lowest concentration of SARS-CoV-2 viral copies this assay can detect is 131 copies/mL. A negative result does not preclude SARS-Cov-2 infection and should not be used as the sole basis for treatment or other patient management decisions. A negative result may occur with  improper specimen collection/handling, submission of specimen other than nasopharyngeal swab, presence of viral mutation(s) within the areas targeted by this assay, and inadequate number of viral copies (<131 copies/mL). A negative result must be combined with clinical observations, patient history, and epidemiological information. The expected result is Negative.  Fact Sheet for Patients:  PinkCheek.be  Fact Sheet for Healthcare Providers:  GravelBags.it  This test is no t yet approved or cleared by the [place] FDA and  has been authorized for detection and/or diagnosis of SARS-CoV-2 by FDA under an Emergency Use Authorization (EUA). This EUA will remain  in effect (meaning this test can be used) for the duration of the COVID-19 declaration under Section 564(b)(1) of the Act, 21 U.S.C. section 360bbb-3(b)(1), unless the authorization is terminated or revoked sooner.     Influenza A by PCR NEGATIVE NEGATIVE Final   Influenza B by PCR NEGATIVE NEGATIVE Final    Comment: (NOTE) The Xpert Xpress SARS-CoV-2/FLU/RSV assay is intended as an aid in  the diagnosis of influenza from Nasopharyngeal swab specimens and  should not be used as a sole basis for treatment. Nasal washings and  aspirates are unacceptable for Xpert Xpress SARS-CoV-2/FLU/RSV  testing.  Fact Sheet for Patients: PinkCheek.be  Fact Sheet for Healthcare Providers: GravelBags.it  This test is not yet approved or cleared by the [place] FDA and  has been  authorized for detection and/or diagnosis of SARS-CoV-2 by  FDA under an Emergency Use Authorization (EUA). This EUA will remain  in effect (meaning this test can be used) for the duration of the  Covid-19 declaration under Section 564(b)(1) of the Act, 21  U.S.C. section 360bbb-3(b)(1), unless the authorization is  terminated or revoked. Performed at [facility], [place] [address] [place], [place] [address]      Labs: BNP (last 3 results) No results for input(s): BNP in the last 8760 hours. Basic Metabolic Panel: Recent Labs  Lab 05/27/20 1429 05/28/20 0230 05/30/20 0233 06/02/20 0757  NA 139 140 135 135  K 4.2 4.1 4.7 5.3*  CL 101 103 100 98  CO2 23 23 23 24   GLUCOSE 261* 150* 160* 139*  BUN 55* 60* 53* 62*  CREATININE 5.54* 6.28* 5.75* 7.43*  CALCIUM 8.9 8.7* 8.5* 8.4*  MG  --   --  2.0  --   PHOS  --   --  4.4  --    Liver Function Tests: Recent Labs  Lab 05/28/20 0230 05/30/20 0233  AST 20  --   ALT 24  --   ALKPHOS 88  --   BILITOT 0.6  --   PROT 6.6  --   ALBUMIN 3.4* 2.8*   No results for input(s): LIPASE, AMYLASE in the last 168 hours. No results for input(s): AMMONIA in the last 168 hours. CBC: Recent Labs  Lab 05/27/20 1429 05/28/20 0230 05/30/20 1010 06/02/20 0757  WBC 7.3 9.4 7.4 8.4  HGB 12.1 11.4* 10.0* 9.2*  HCT 39.8 36.3 32.7* 30.7*  MCV 86.9 84.0 87.0 86.0  PLT 150 142* 119* 128*   Cardiac Enzymes: No results for input(s): CKTOTAL, CKMB, CKMBINDEX, TROPONINI in the last 168 hours. BNP: Invalid input(s): POCBNP CBG: Recent Labs  Lab 06/02/20 0644 06/02/20 1217 06/02/20 1637 06/03/20 0156 06/03/20 0758  GLUCAP 125* 159* 129* 131* 112*   D-Dimer No results for input(s): DDIMER in the last 72 hours. Hgb A1c No results for input(s): HGBA1C in the last 72 hours. Lipid Profile No results for input(s): CHOL, HDL, LDLCALC, TRIG, CHOLHDL, LDLDIRECT in the last 72 hours. Thyroid function studies No results for input(s): TSH,  T4TOTAL, T3FREE, THYROIDAB in the last 72 hours.  Invalid input(s): FREET3 Anemia work up No results for input(s): VITAMINB12, FOLATE, FERRITIN, TIBC, IRON, RETICCTPCT in the last 72 hours. Urinalysis    Component Value Date/Time   COLORURINE STRAW (A) 03/07/2020 1123   APPEARANCEUR TURBID (A) 03/07/2020 1123   LABSPEC 1.019 03/07/2020 1123   PHURINE 7.0 03/07/2020 1123   GLUCOSEU NEGATIVE 03/07/2020 1123   GLUCOSEU >=1000 (A) 11/19/2015 1505   HGBUR SMALL (A) 03/07/2020 1123   BILIRUBINUR NEGATIVE 03/07/2020 1123   BILIRUBINUR neg 03/13/2015 1858   KETONESUR NEGATIVE 03/07/2020 1123   PROTEINUR >=300 (A) 03/07/2020 1123   UROBILINOGEN 0.2 11/19/2015 1505   NITRITE NEGATIVE 03/07/2020 1123   LEUKOCYTESUR MODERATE (A) 03/07/2020 1123   Sepsis Labs Invalid input(s): PROCALCITONIN,  WBC,  LACTICIDVEN Microbiology Recent Results (from the past 240 hour(s))  Respiratory Panel by RT PCR (Flu A&B, Covid) - Nasopharyngeal Swab     Status: None   Collection Time: 05/27/20 10:13 PM   Specimen: Nasopharyngeal Swab; Nasopharyngeal(NP) swabs in vial transport medium  Result Value Ref Range Status   SARS Coronavirus 2 by RT PCR NEGATIVE NEGATIVE Final    Comment: (NOTE) SARS-CoV-2 target nucleic acids are NOT DETECTED.  The SARS-CoV-2 RNA is generally detectable in upper respiratoy specimens during the acute phase of infection. The lowest concentration of SARS-CoV-2 viral copies this assay can detect is 131 copies/mL. A negative result does not preclude SARS-Cov-2 infection and should not be used as the sole basis for treatment or other patient management decisions. A negative result may occur with  improper specimen collection/handling, submission of specimen other than nasopharyngeal swab, presence of viral mutation(s) within the areas targeted by this assay, and inadequate number of viral copies (<131 copies/mL). A negative result must be combined with clinical observations, patient  history, and epidemiological information. The expected result is Negative.  Fact Sheet for Patients:  PinkCheek.be  Fact Sheet for Healthcare Providers:  GravelBags.it  This test is no t yet approved or cleared by the [place] and  has been authorized for  detection and/or diagnosis of SARS-CoV-2 by FDA under an Emergency Use Authorization (EUA). This EUA will remain  in effect (meaning this test can be used) for the duration of the COVID-19 declaration under Section 564(b)(1) of the Act, 21 U.S.C. section 360bbb-3(b)(1), unless the authorization is terminated or revoked sooner.     Influenza A by PCR NEGATIVE NEGATIVE Final   Influenza B by PCR NEGATIVE NEGATIVE Final    Comment: (NOTE) The Xpert Xpress SARS-CoV-2/FLU/RSV assay is intended as an aid in  the diagnosis of influenza from Nasopharyngeal swab specimens and  should not be used as a sole basis for treatment. Nasal washings and  aspirates are unacceptable for Xpert Xpress SARS-CoV-2/FLU/RSV  testing.  Fact Sheet for Patients: PinkCheek.be  Fact Sheet for Healthcare Providers: GravelBags.it  This test is not yet approved or cleared by the [place] FDA and  has been authorized for detection and/or diagnosis of SARS-CoV-2 by  FDA under an Emergency Use Authorization (EUA). This EUA will remain  in effect (meaning this test can be used) for the duration of the  Covid-19 declaration under Section 564(b)(1) of the Act, 21  U.S.C. section 360bbb-3(b)(1), unless the authorization is  terminated or revoked. Performed at [facility], [place] [address], [place], [place] [address]      Time coordinating discharge: Over 30 minutes  SIGNED:   Calyse Murcia J [place] DO  [facility] 06/03/2020, 10:19 AM

## 2020-06-03 NOTE — TOC Transition Note (Addendum)
Transition of Care (TOC) - CM/SW Discharge Note [name] RN, BSN Transitions of Care Unit 4E- RN Case Manager See Treatment Team for direct phone # Cross Coverage for [place]   Patient Details  Name: [name] [name] MRN: [id number] Date of Birth: 08-Jan-1966  Transition of Care [facility]) CM/SW Contact:  Dawayne Patricia, RN Phone Number: 06/03/2020, 1:34 PM   Clinical Narrative:    Pt stable for transition home today- [place] orders have been placed for resumption of services with [facility] as per pt choice. Pt has needed DME at home- no DME needs noted.  Pt to return home with husband.  [facility] has been notified of transition home today and to f/u for restart of care  1500- notified by RN that pt states she will need [facility] transport home- RN verified address in epic- [place] called for transport due to deconditioning and no support to get in the home pt refusing SNF and elected to return home with [facility]- pt understands insurance may not cover. Paperwork given to charge RN on [place]  Final next level of care: [place] Barriers to Discharge: Barriers Resolved   Patient Goals and CMS Choice Patient states their goals for this hospitalization and ongoing recovery are:: to go home with home health services CMS Medicare.gov Compare Post Acute Care list provided to:: Patient Choice offered to / list presented to : Patient  Discharge Placement                Home with [facility]        Discharge Plan and Services In-house Referral: Clinical Social Work Discharge Planning Services: CM Consult Post Acute Care Choice: Home Health                    HH Arranged: PT, OT [facility] Agency: [facility] Date [place]: 06/02/20 Time HH Agency Contacted: 1600 Representative spoke with at [place]: [place] Determinants of Health ([place]) Interventions     Readmission Risk Interventions Readmission Risk Prevention Plan 06/03/2020  Transportation Screening  Complete  PCP or Specialist Appt within 3-5 Days Complete  HRI or [place] Complete  Social Work Consult for [place] Planning/Counseling Complete  Palliative Care Screening Not Applicable  Medication Review Press photographer) Complete  Some recent data might be hidden

## 2020-06-03 NOTE — Progress Notes (Signed)
Patient d/c'd awaiting pickup by ambulance. Writer called to discuss pickup time, may be about 2 hrs. Patient made aware and put back in bed for now. Patient refuses any night time meds, stating she will take them when she gets home. Will continue to monitor at this time.

## 2020-06-03 NOTE — Progress Notes (Signed)
Patient ID: [name] [name], female   DOB: June 30, 1966, 54 y.o.   MRN: [id number] S: feels better O:BP 117/66 (BP Location: Left Arm)   Pulse 61   Temp 98.2 F (36.8 C) (Oral)   Resp 15   Wt 75.4 kg   SpO2 97%   BMI 32.46 kg/m   Intake/Output Summary (Last 24 hours) at 06/03/2020 1216 Last data filed at 06/03/2020 1000 Gross per 24 hour  Intake 600 ml  Output 2006 ml  Net -1406 ml   Intake/Output: I/O last 3 completed shifts: In: 600 [P.O.:600] Out: 2006 [Other:2006]  Intake/Output this shift:  Total I/O In: 600 [P.O.:600] Out: -  Weight change: 1.3 kg Gen: NAD CVS: no rub Resp: cta Abd: benign Ext: no edema  Recent Labs  Lab 05/27/20 1429 05/28/20 0230 05/30/20 0233 06/02/20 0757  NA 139 140 135 135  K 4.2 4.1 4.7 5.3*  CL 101 103 100 98  CO2 23 23 23 24   GLUCOSE 261* 150* 160* 139*  BUN 55* 60* 53* 62*  CREATININE 5.54* 6.28* 5.75* 7.43*  ALBUMIN  --  3.4* 2.8*  --   CALCIUM 8.9 8.7* 8.5* 8.4*  PHOS  --   --  4.4  --   AST  --  20  --   --   ALT  --  24  --   --    Liver Function Tests: Recent Labs  Lab 05/28/20 0230 05/30/20 0233  AST 20  --   ALT 24  --   ALKPHOS 88  --   BILITOT 0.6  --   PROT 6.6  --   ALBUMIN 3.4* 2.8*   No results for input(s): LIPASE, AMYLASE in the last 168 hours. No results for input(s): AMMONIA in the last 168 hours. CBC: Recent Labs  Lab 05/27/20 1429 05/27/20 1429 05/28/20 0230 05/30/20 1010 06/02/20 0757  WBC 7.3   < > 9.4 7.4 8.4  HGB 12.1   < > 11.4* 10.0* 9.2*  HCT 39.8   < > 36.3 32.7* 30.7*  MCV 86.9  --  84.0 87.0 86.0  PLT 150   < > 142* 119* 128*   < > = values in this interval not displayed.   Cardiac Enzymes: No results for input(s): CKTOTAL, CKMB, CKMBINDEX, TROPONINI in the last 168 hours. CBG: Recent Labs  Lab 06/02/20 1217 06/02/20 1637 06/03/20 0156 06/03/20 0758 06/03/20 1116  GLUCAP 159* 129* 131* 112* 217*    Iron Studies: No results for input(s): IRON, TIBC, TRANSFERRIN,  FERRITIN in the last 72 hours. Studies/Results: No results found. Marland Kitchen acidophilus  1 capsule Oral Daily  . amLODipine  10 mg Oral Daily  . atropine  1 drop Right Eye TID  . carvedilol  25 mg Oral BID WC  . Chlorhexidine Gluconate Cloth  6 each Topical Q0600  . gabapentin  100 mg Oral Daily  . gabapentin  200 mg Oral QHS  . heparin  5,000 Units Subcutaneous Q8H  . hydrALAZINE  75 mg Oral Q8H  . insulin aspart  0-6 Units Subcutaneous TID WC  . insulin glargine  5 Units Subcutaneous QHS  . isosorbide dinitrate  20 mg Oral TID  . magnesium oxide  400 mg Oral BID  . sodium chloride flush  3 mL Intravenous Q12H    BMET    Component Value Date/Time   NA 135 06/02/2020 0757   K 5.3 (H) 06/02/2020 0757   CL 98 06/02/2020 0757   CO2 24 06/02/2020  0757   GLUCOSE 139 (H) 06/02/2020 0757   BUN 62 (H) 06/02/2020 0757   CREATININE 7.43 (H) 06/02/2020 0757   CREATININE 0.68 10/04/2013 1418   CALCIUM 8.4 (L) 06/02/2020 0757   GFRNONAA 6 (L) 06/02/2020 0757   GFRNONAA >89 10/04/2013 1418   GFRAA 10 (L) 03/14/2020 0628   GFRAA >89 10/04/2013 1418   CBC    Component Value Date/Time   WBC 8.4 06/02/2020 0757   RBC 3.57 (L) 06/02/2020 0757   HGB 9.2 (L) 06/02/2020 0757   HCT 30.7 (L) 06/02/2020 0757   PLT 128 (L) 06/02/2020 0757   MCV 86.0 06/02/2020 0757   MCH 25.8 (L) 06/02/2020 0757   MCHC 30.0 06/02/2020 0757   RDW 19.4 (H) 06/02/2020 0757   LYMPHSABS 3.2 03/14/2020 0628   MONOABS 0.7 03/14/2020 0628   EOSABS 0.2 03/14/2020 0628   BASOSABS 0.1 03/14/2020 0628     [facility] HD [facility] BF 350, DF 700  EDW 65 kg  3.5 hours F180, 2K/3.0 Ca Heparin with tx 5000 bolus and 500 hourly  Left IJ Tunn Catheter Last tx on 11/21 with post weight of 65 kg  Assessment/Plan:  1. ESRD- continue with MWF schedule.  For discharge today and will follow up tomorrow with her home outpatient dialysis unit. 2. Hypertensive emergency- resolved and BP well controlled.  Agree  that clonidine patch may improve compliance. 3. Anemia of ESRD- stable 4. DM type 2- per primary 5. Multiple falls/weakness/debility- recommended SNF per PT/OT.  Sw involved but pt wants home with HHC.   6. Disposition- for discharge to home today.  Donetta Potts, MD [employer] [phone]

## 2020-06-04 NOTE — Progress Notes (Signed)
Ambulance here to pick up patient at this time.

## 2020-07-29 ENCOUNTER — Encounter (HOSPITAL_COMMUNITY): Payer: Self-pay | Admitting: Emergency Medicine

## 2020-07-29 ENCOUNTER — Emergency Department (HOSPITAL_COMMUNITY)
Admission: EM | Admit: 2020-07-29 | Discharge: 2020-07-30 | Disposition: A | Discharge status: Home / Self Care | Payer: Medicare Other | Attending: Emergency Medicine | Admitting: Emergency Medicine

## 2020-07-29 DIAGNOSIS — N186 End stage renal disease: Secondary | ICD-10-CM | POA: Insufficient documentation

## 2020-07-29 DIAGNOSIS — Z79899 Other long term (current) drug therapy: Secondary | ICD-10-CM | POA: Insufficient documentation

## 2020-07-29 DIAGNOSIS — E1122 Type 2 diabetes mellitus with diabetic chronic kidney disease: Secondary | ICD-10-CM | POA: Diagnosis not present

## 2020-07-29 DIAGNOSIS — U071 COVID-19: Secondary | ICD-10-CM | POA: Diagnosis not present

## 2020-07-29 DIAGNOSIS — I120 Hypertensive chronic kidney disease with stage 5 chronic kidney disease or end stage renal disease: Secondary | ICD-10-CM | POA: Insufficient documentation

## 2020-07-29 DIAGNOSIS — Z794 Long term (current) use of insulin: Secondary | ICD-10-CM | POA: Insufficient documentation

## 2020-07-29 DIAGNOSIS — R519 Headache, unspecified: Secondary | ICD-10-CM | POA: Diagnosis present

## 2020-07-29 DIAGNOSIS — E1140 Type 2 diabetes mellitus with diabetic neuropathy, unspecified: Secondary | ICD-10-CM | POA: Insufficient documentation

## 2020-07-29 DIAGNOSIS — Z9115 Patient's noncompliance with renal dialysis: Secondary | ICD-10-CM | POA: Diagnosis not present

## 2020-07-29 LAB — I-STAT BETA HCG BLOOD, ED (MC, WL, AP ONLY): I-stat hCG, quantitative: 7.2 m[IU]/mL — ABNORMAL HIGH (ref <5)

## 2020-07-29 LAB — CBC
HCT: 44.8 % (ref 36.0–46.0)
Hemoglobin: 14.2 g/dL (ref 12.0–15.0)
MCH: 27.7 pg (ref 26.0–34.0)
MCHC: 31.7 g/dL (ref 30.0–36.0)
MCV: 87.5 fL (ref 80.0–100.0)
Platelets: 179 10*3/uL (ref 150–400)
RBC: 5.12 MIL/uL — ABNORMAL HIGH (ref 3.87–5.11)
RDW: 17 % — ABNORMAL HIGH (ref 11.5–15.5)
WBC: 8.9 10*3/uL (ref 4.0–10.5)
nRBC: 0 % (ref 0.0–0.2)

## 2020-07-29 LAB — CBG MONITORING, ED
Glucose-Capillary: 64 mg/dL — ABNORMAL LOW (ref 70–99)
Glucose-Capillary: 103 mg/dL — ABNORMAL HIGH (ref 70–99)

## 2020-07-29 LAB — BASIC METABOLIC PANEL
Anion gap: 21 — ABNORMAL HIGH (ref 5–15)
BUN: 71 mg/dL — ABNORMAL HIGH (ref 6–20)
CO2: 18 mmol/L — ABNORMAL LOW (ref 22–32)
Calcium: 7.8 mg/dL — ABNORMAL LOW (ref 8.9–10.3)
Chloride: 94 mmol/L — ABNORMAL LOW (ref 98–111)
Creatinine, Ser: 10.77 mg/dL — ABNORMAL HIGH (ref 0.44–1.00)
GFR, Estimated: 4 mL/min — ABNORMAL LOW (ref >60)
Glucose, Bld: 70 mg/dL (ref 70–99)
Potassium: 5.4 mmol/L — ABNORMAL HIGH (ref 3.5–5.1)
Sodium: 133 mmol/L — ABNORMAL LOW (ref 135–145)

## 2020-07-29 MED ORDER — SODIUM ZIRCONIUM CYCLOSILICATE 10 G PO PACK
10.0000 g | PACK | Freq: Once | ORAL | Status: AC
  Administered 2020-07-29: 10 g via ORAL
  Filled 2020-07-29: qty 1

## 2020-07-29 MED ORDER — SODIUM CHLORIDE 0.9% FLUSH: 3.0000 mL | INTRAVENOUS | Status: DC | PRN

## 2020-07-29 MED ORDER — SODIUM CHLORIDE 0.9% FLUSH: 3.0000 mL | Freq: Two times a day (BID) | INTRAVENOUS | Status: DC

## 2020-07-29 MED ORDER — SODIUM CHLORIDE 0.9 % IV SOLN: 250.0000 mL | INTRAVENOUS | Status: DC | PRN

## 2020-07-29 NOTE — ED Provider Notes (Signed)
[facility] EMERGENCY DEPARTMENT Provider Note   CSN: [id number] Arrival date & time: 07/29/20  1135     History Chief Complaint  Patient presents with  . Weakness    [name] [name] is a 55 y.o. female with medical history significant ofhypertension, insulin-dependent type 2 diabetes, diabetic neuropathy, ESRD on hemodialysis MWF, NAFLD,chronic bilateral lower extremity weakness, recurrent ESBL Klebsiella UTI, history of pubic symphysis septic arthritis,osteomyelitis and extraperitoneal left thigh abscess status post I &D of left groin on 11/26/2019 who presents to the Emergency Department who presents to the emergency department with a chief complaint of generalized weakness.  The patient reports that she has been feeling weak and fatigued for several weeks.  She is also endorsing a mild headache.  She has had poor p.o. intake over the last week because she has not had much of an appetite.  She denies chest pain, shortness of breath, fever, vomiting, syncope, numbness, visual changes.   States that she was previously ambulating with a walker, but was feeling too weak to use her walker over the last few days.  She also has a wheelchair at home.  She reports that she has home health set up 7 days a week.  She also reports that she did not go to dialysis today or 2 days ago.  Her last dialysis appointment was on January 21 while she was admitted at [facility].  She would like to be dialyzed today.  Patient was recently discharged at [facility] from January 20 through January 23.  She presented with generalized weakness and fatigue and found to be COVID-19 positive on 1/20 (see care everywhere).  She had missed 2 dialysis sessions prior to her hospitalization. Per chart review: "On the night of 1/22, patient was noted to be taking her home oxycodone prescription from her purse. Security was then called in to secure her home medications and bring to pharmacy.  After this, patient refused medications from staff and was minimally interactive outside of curt responses. On morning of 1/23, patient was recommended to stay for treatment, but notified if she did not want to be treated or interact with staff, she would have to be discharged. She denied any family that could be called on her behalf. Patient continued to refuse treatment or converse with staff and was discharged morning of 1/23."  Notably, she also had an admission from September 3-11 at [facility] after she presented with altered mental status.  She was noted to be confused and unresponsive at rehab and initial CBG was 44.  She was found to have sepsis secondary to Enterococcus faecalis, staph lugdunensis, UTI, and POA.   She no longer makes urine.  The history is provided by the patient and medical records. No language interpreter was used.       Past Medical History:  Diagnosis Date  . Colitis   . Diabetes mellitus   . Family history of adverse reaction to anesthesia    " my uncle's heart stoped "  . GERD (gastroesophageal reflux disease)   . Heart murmur   . Hypertension   . 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 Mitral regurgitation   . Neuropathy in diabetes ([place])   . Syncope and collapse 07/28/2015    Patient Active Problem List   Diagnosis Date Noted  . Pressure injury of skin 03/12/2020  . Sepsis ([place]) 03/07/2020  . ESRD (end stage renal disease) ([place]) 03/07/2020  . Hyperkalemia 03/07/2020  .  Acute cystitis 03/07/2020  . Acute metabolic encephalopathy 57/26/2035  . 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 Diabetes mellitus with complication ([place])   . Intractable headache   . Syncope 07/28/2015  . Chronic headaches 07/28/2015  . Chest pain 05/22/2015  . Urinary frequency 03/13/2015  . Easy bruising 10/09/2014  . Encounter for preventative adult health care exam with abnormal findings 01/11/2014  . Chronic constipation 01/11/2014  . GERD (gastroesophageal  reflux disease) 01/11/2014  . Helicobacter positive gastritis 12/06/2013  . Dizziness and giddiness 10/12/2013  . Essential hypertension 09/19/2013  . Back pain 06/11/2013  . Lumbosacral spondylosis without myelopathy 02/20/2013  . Diabetic neuropathy ([place]) 01/09/2013  . DM (diabetes mellitus), type 2, uncontrolled ([place]) 10/02/2012  . MITRAL REGURGITATION, mild 10/31/2009  . PLEURAL EFFUSION, RIGHT after pyelonephritis in April 2011 (no intervention) 10/20/2009    Past Surgical History:  Procedure Laterality Date  . ABDOMINAL HYSTERECTOMY    . CATARACT EXTRACTION    . CESAREAN SECTION    . CHOLECYSTECTOMY    . EYE SURGERY Left retinal  . GANGLION CYST EXCISION    . TONSILLECTOMY    . TUBAL LIGATION       OB History   No obstetric history on file.     Family History  Problem Relation Age of Onset  . Diabetes Father   . Heart attack Father   . Aneurysm Mother     Social History   Tobacco Use  . Smoking status: Never Smoker  . Smokeless tobacco: Never Used  Substance Use Topics  . Alcohol use: No    Alcohol/week: 0.0 standard drinks  . Drug use: No    Home Medications Prior to Admission medications   Medication Sig Start Date End Date Taking? Authorizing Provider  amLODipine (NORVASC) 10 MG tablet Take 10 mg by mouth daily. 02/25/20   [provider]  Ascorbic Acid (VITAMIN C) 100 MG tablet Take 100 mg by mouth daily.    [provider]  atropine 1 % ophthalmic solution Place 1 drop into the right eye 3 (three) times daily.    [provider]  B Complex-C (B-COMPLEX WITH VITAMIN C) tablet Take 1 tablet by mouth daily.    [provider]  bisacodyl (DULCOLAX) 10 MG suppository Place 10 mg rectally daily as needed for moderate constipation.    [provider]  carvedilol (COREG) 25 MG tablet Take 25 mg by mouth 2 (two) times daily. 03/04/20   [provider]  gabapentin (NEURONTIN) 100 MG capsule Take 200 mg by  mouth at bedtime.    [provider]  gabapentin (NEURONTIN) 100 MG capsule Take 100 mg by mouth daily.    [provider]  hydrALAZINE (APRESOLINE) 25 MG tablet Take 3 tablets (75 mg total) by mouth every 8 (eight) hours. 06/03/20 09/01/20  [place] Donnamarie Poag, DO  Insulin Glargine 300 UNIT/ML SOPN Inject 13 Units into the skin daily. Patient taking differently: Inject 10 Units into the skin daily.  03/15/20   Dessa Phi, DO  insulin lispro (HUMALOG) 100 UNIT/ML injection Inject 2-14 Units into the skin daily. Per sliding scale: BG  201-250= 2 units, 251-300 = 4 units, 301-350= 6 units, 351-400= 8 units, 401-450= 10 units, 451-500 = 12 units, 500 or greater = 14 units.    [provider]  isosorbide dinitrate (ISORDIL) 20 MG tablet Take 20 mg by mouth 3 (three) times daily.    [provider]  LACTOBACILLUS EXTRA STRENGTH PO Take  1 capsule by mouth daily.    [provider]  Magnesium 400 MG TABS Take 400 mg by mouth 2 (two) times daily.    [provider]  methocarbamol (ROBAXIN) 500 MG tablet Take 1 tablet (500 mg total) by mouth every 6 (six) hours as needed for muscle spasms (headache). 06/03/20   [place] Donnamarie Poag, DO  oxyCODONE (ROXICODONE) 5 MG immediate release tablet Take 1 tablet (5 mg total) by mouth every 6 (six) hours as needed for severe pain. 03/15/20   Dessa Phi, DO  polyethylene glycol (MIRALAX / GLYCOLAX) 17 g packet Take 17 g by mouth daily as needed for mild constipation.     [provider]  sennosides-docusate sodium (SENOKOT-S) 8.6-50 MG tablet Take 1 tablet by mouth daily as needed for constipation.     [provider]  SUMAtriptan (IMITREX) 100 MG tablet Take 1 tablet (100 mg total) by mouth once as needed for migraine. May repeat in 2 hours if headache persists or recurs. 06/03/20 06/04/21  [place] Eric J, DO    Allergies    Amitriptyline, Ciprofloxacin, Semaglutide, Tramadol, and Tylenol  [acetaminophen]  Review of Systems   Review of Systems  Constitutional: Positive for fatigue. Negative for activity change, chills and fever.  HENT: Negative for congestion and sore throat.   Eyes: Negative for visual disturbance.  Respiratory: Negative for cough, shortness of breath and wheezing.   Cardiovascular: Negative for chest pain and palpitations.  Gastrointestinal: Negative for abdominal pain, diarrhea and vomiting.  Genitourinary: Negative for dysuria.  Musculoskeletal: Negative for back pain, myalgias, neck pain and neck stiffness.  Skin: Negative for rash.  Allergic/Immunologic: Negative for immunocompromised state.  Neurological: Positive for weakness and headaches. Negative for dizziness, seizures, syncope and numbness.  Psychiatric/Behavioral: Negative for confusion.    Physical Exam Updated Vital Signs BP (!) 158/78   Pulse 63   Temp 98.6 F (37 C) (Oral)   Resp 19   SpO2 93%   Physical Exam Vitals and nursing note reviewed.  Constitutional:      General: She is not in acute distress.    Appearance: She is not ill-appearing, toxic-appearing or diaphoretic.     Comments: Chronically ill-appearing, but no acute distress.  She is not ill or toxic appearing. She is actively eating applesauce, a sandwich, and crackers at bedside without difficulty.  HENT:     Head: Normocephalic.     Mouth/Throat:     Mouth: Mucous membranes are moist.  Eyes:     Extraocular Movements: Extraocular movements intact.     Conjunctiva/sclera: Conjunctivae normal.     Pupils: Pupils are equal, round, and reactive to light.  Cardiovascular:     Rate and Rhythm: Normal rate and regular rhythm.     Heart sounds: No murmur heard. No friction rub. No gallop.   Pulmonary:     Effort: Pulmonary effort is normal. No respiratory distress.     Breath sounds: No stridor. No wheezing, rhonchi or rales.     Comments: Lungs are clear to auscultation bilaterally.  No increased work of  breathing.  Able to speak in complete, fluent sentences. Chest:     Chest wall: No tenderness.  Abdominal:     General: There is no distension.     Palpations: Abdomen is soft. There is no mass.     Tenderness: There is no abdominal tenderness. There is no right CVA tenderness, left CVA tenderness, guarding or rebound.     Hernia: No hernia is  present.     Comments: Abdomen soft, nontender, nondistended.  Musculoskeletal:     Cervical back: Neck supple.     Right lower leg: No edema.     Left lower leg: No edema.  Skin:    General: Skin is warm.     Capillary Refill: Capillary refill takes less than 2 seconds.     Findings: No rash.  Neurological:     Mental Status: She is alert.     Comments: Moves all 4 extremities spontaneously.  Exam is somewhat limited secondary to patient participation.  Sensation is intact and equal throughout the bilateral upper and lower extremities.  Cranial nerves II through XII are grossly intact.  No drift.  Alert and oriented x4.  Psychiatric:        Behavior: Behavior normal.     ED Results / Procedures / Treatments   Labs (all labs ordered are listed, but only abnormal results are displayed) Labs Reviewed  BASIC METABOLIC PANEL - Abnormal; Notable for the following components:      Result Value   Sodium 133 (*)    Potassium 5.4 (*)    Chloride 94 (*)    CO2 18 (*)    BUN 71 (*)    Creatinine, Ser 10.77 (*)    Calcium 7.8 (*)    GFR, Estimated 4 (*)    Anion gap 21 (*)    All other components within normal limits  CBC - Abnormal; Notable for the following components:   RBC 5.12 (*)    RDW 17.0 (*)    All other components within normal limits  CBG MONITORING, ED - Abnormal; Notable for the following components:   Glucose-Capillary 64 (*)    All other components within normal limits  I-STAT BETA HCG BLOOD, ED ([facility], [facility], [facility] ONLY) - Abnormal; Notable for the following components:   I-stat hCG, quantitative 7.2 (*)    All other components  within normal limits  CBG MONITORING, ED - Abnormal; Notable for the following components:   Glucose-Capillary 103 (*)    All other components within normal limits  URINALYSIS, ROUTINE W REFLEX MICROSCOPIC    EKG EKG Interpretation  Date/Time:  Tuesday July 29 2020 11:47:08 EST Ventricular Rate:  68 PR Interval:  140 QRS Duration: 74 QT Interval:  456 QTC Calculation: 484 R Axis:   -63 Text Interpretation: Sinus rhythm with occasional Premature ventricular complexes Possible Left atrial enlargement Left axis deviation Anteroseptal infarct , age undetermined Abnormal ECG Confirmed by Madalyn Rob [phone]) on 07/29/2020 10:55:54 PM   Radiology No results found.  Procedures Procedures   Medications Ordered in ED Medications  sodium chloride flush (NS) 0.9 % injection 3 mL (3 mLs Intravenous Not Given 07/29/20 2319)  sodium chloride flush (NS) 0.9 % injection 3 mL (has no administration in time range)  0.9 %  sodium chloride infusion (has no administration in time range)  sodium zirconium cyclosilicate (LOKELMA) packet 10 g (10 g Oral Given 07/29/20 2259)    ED Course  I have reviewed the triage vital signs and the nursing notes.  Pertinent labs & imaging results that were available during my care of the patient were reviewed by me and considered in my medical decision making (see chart for details).  Clinical Course as of 07/30/20 0020  Tue Jul 29, 2020  2235 Received permission to call the patient's husband.  Attempt unsuccessful. [MM]  2235 HIPAA compliant voicemail left for the patient's husband. [MM]  2318 Received permission  from the patient to speak with her son.  Attempted to call son Corene Cornea at [phone].  Number disconnected.  RN will speak with patient to see if there is an alternate phone number. [MM]  3762 Consulted transition of care.  Spoke with Josie Dixon.  Patient did receive a call from transition of care following her discharge from [facility]  regional yesterday.  She stated that all her needs are being met at that time.  Patient currently has home health 7 days a week.  She has a wheelchair and a walker at home.  Per transition of care, patient is appropriate for discharge given her current home health regimen.  Lucia Bitter has attempted to contact the patient's husband multiple times.  She is now contacting police to go to the patient's home.  She had but advised that nursing staff to call [facility] to expedite the patient's discharged home.  Patient has been updated. [MM]  Wed Jul 30, 2020  0002 Updated patient on plan of care.  She is upset that she is not receiving dialysis while she is at the hospital.  Patient has been advised that renal navigator will follow up with her to coordinate her dialysis schedule.  She has been given Lokelma in the ER. [MM]  0006 Spoke with the patient's husband, Marland Kitchen, who did not express any concerns with the patient being discharged home.  He is aware that she will be returned home via [place].  All questions answered. [MM]  0007 Secure message sent to Terri Piedra, renal navigator, since the patient missed dialysis today to help coordinate her next dialysis appointment.  I informed the patient that she may receive a call from [facility] to ensure that she can make it to her next dialysis appointment. [MM]    Clinical Course User Index [MM] Tesneem Dufrane, Laymond Purser PA-C   MDM Rules/Calculators/A&P                          56 year old female with medical history significant ofhypertension, insulin-dependent type 2 diabetes, diabetic neuropathy, ESRD on hemodialysis MWF, NAFLD,chronic bilateral lower extremity weakness, recurrent ESBL Klebsiella UTI, history of pubic symphysis septic arthritis,osteomyelitis and extraperitoneal left thigh abscess status post I &D of left groin on 11/26/2019 who presents the emergency department by EMS from home for fatigue and generalized weakness for the last few weeks.  She tested  positive for COVID-19 on January 20.  She has not been dialyzed since January 21 while she was at [facility].  She has had no constitutional symptoms, vomiting, chest pain, shortness of breath.  Vital signs are normal.  She does not appear volume overloaded.  She is alert and oriented x4 and has no focal neurologic deficits.  The patient was seen and evaluated at bedside by Dr. Roslynn Amble, attending physician.  Labs have been reviewed and independently interpreted by me.  Potassium is 5.4.  Although hyperkalemia regimen starts at a potassium of 5.5, will give the patient a dose of Lokelma in the ER since her labs are otherwise generally reassuring.  Her creatinine is elevated, but she has not been dialyzed in a few days.  She is having no chest pain or shortness of breath.  EKG similar to previous.  No indication for emergent dialysis.  Bicarb is slightly low at 18, I suspect this is metabolic and from poor p.o. intake.  However, patient is actively tolerating fluids at bedside.  Given that she has missed  dialysis, will recommend oral intake as opposed to replenishing via IV.  She was initially hypoglycemic in the waiting room and I suspect this is secondary to poor p.o. intake from COVID-19.  However, at bedside she is actively eating a sandwich, applesauce, and crackers.  She also was given food in the waiting room and hypoglycemia has resolved.   She does report that she has had increased weakness in her legs and difficulty walking, but does have home health set up 7 days a week and has a wheelchair and a walker at home.  Notably, transitional care management spoke with the patient on January 24 after her recent discharge from the hospital and the patient stated that she had all of the meds that she needed and had no other concerns at that time.  She also has an appointment with Dr. Lajoyce Corners, tomorrow, 1/27, which I have encouraged her to keep.  At this time, the patient is hemodynamically stable and  in no acute distress.  Safe for discharge to home with home health, durable medical equipment, PCP follow-up tomorrow, and renal navigator looped into her care.    Final Clinical Impression(s) / ED Diagnoses Final diagnoses:  COVID-19  Dialysis patient, noncompliant [facility])    Rx / DC Orders ED Discharge Orders    None       Joanne Gavel, PA-C 07/30/20 0020    Lucrezia Starch, MD 07/30/20 [phone]

## 2020-07-29 NOTE — ED Notes (Signed)
Marland Kitchen (Husband#[phone]) called/would like a call back from the patient.  He stated: Tried calling her cellphone, but no answer.  Thank you

## 2020-07-29 NOTE — ED Triage Notes (Signed)
Pt arrives via [facility] from home where she has home health care that reports increased generalized weakness over the past week with decreased PO intake. Ems reports that patient was seen recently in [place] but unsure why. Pt did slide off of her bed onto the floor today, was unable to get up by herself. Pt does do dialysis, is unsure of last session. CBG 77, 178/80, HR 70s NS, 95% on ra, temp 99. Pt a/o to person and place, situation, disoriented to time.

## 2020-07-29 NOTE — Care Management (Addendum)
ED CM attempted to contact husband by phone no answer, ED CM called [facility] to perform a wellness check. Reviewed record TOC at [facility] services arranged and patient has equipment needed at home, discussed with EDP and patient does not meet criteria for hospitalization, will be discharged home and transported by [facility].

## 2020-07-29 NOTE — Discharge Instructions (Addendum)
Thank you for allowing me to care for you today in the Emergency Department.   You have COVID-19.  You will likely feel very tired and weak for some time.   I have followed up with our renal navigator to ensure that you can get to dialysis.  It is very important that you go to your outpatient dialysis appointment.  Your blood sugar was initially low when you got here.  It is important that you are eating and drinking at home.  Please follow-up with primary care.  They can also assist if you need to have your home health options re-evaluated.  You should also be receiving follow-up calls from [facility] regional from your recent discharge since they just called you yesterday.  Return to the emergency department if you pass out, if you develop severe chest pain, confusion, uncontrollable vomiting, respiratory distress, or other new, concerning symptoms.

## 2020-07-30 NOTE — ED Notes (Signed)
Patient verbalizes understanding of discharge instructions. Opportunity for questioning and answers were provided. Armband removed by staff, pt discharged from ED via [name] [name].

## 2020-07-30 NOTE — ED Notes (Signed)
[facility] CALLED  °

## 2020-10-02 ENCOUNTER — Inpatient Hospital Stay (HOSPITAL_COMMUNITY): Payer: Medicare Other | Admitting: Certified Registered Nurse Anesthetist

## 2020-10-02 ENCOUNTER — Inpatient Hospital Stay (HOSPITAL_COMMUNITY)
Admission: EM | Admit: 2020-10-02 | Discharge: 2020-10-16 | DRG: 480 | Disposition: A | Discharge status: Skilled Nursing Facility | Payer: Medicare Other | Attending: Internal Medicine | Admitting: Internal Medicine

## 2020-10-02 ENCOUNTER — Encounter (HOSPITAL_COMMUNITY): Payer: Self-pay | Admitting: Internal Medicine

## 2020-10-02 ENCOUNTER — Emergency Department (HOSPITAL_COMMUNITY): Payer: Medicare Other

## 2020-10-02 ENCOUNTER — Other Ambulatory Visit: Payer: Self-pay

## 2020-10-02 ENCOUNTER — Inpatient Hospital Stay (HOSPITAL_COMMUNITY): Payer: Medicare Other

## 2020-10-02 ENCOUNTER — Encounter (HOSPITAL_COMMUNITY)
Admission: EM | Disposition: A | Discharge status: Skilled Nursing Facility | Payer: Self-pay | Source: Home / Self Care | Attending: Internal Medicine

## 2020-10-02 DIAGNOSIS — J9811 Atelectasis: Secondary | ICD-10-CM | POA: Diagnosis not present

## 2020-10-02 DIAGNOSIS — R112 Nausea with vomiting, unspecified: Secondary | ICD-10-CM | POA: Diagnosis present

## 2020-10-02 DIAGNOSIS — D631 Anemia in chronic kidney disease: Secondary | ICD-10-CM | POA: Diagnosis present

## 2020-10-02 DIAGNOSIS — Z8616 Personal history of COVID-19: Secondary | ICD-10-CM | POA: Diagnosis not present

## 2020-10-02 DIAGNOSIS — N186 End stage renal disease: Secondary | ICD-10-CM | POA: Diagnosis not present

## 2020-10-02 DIAGNOSIS — S72001A Fracture of unspecified part of neck of right femur, initial encounter for closed fracture: Secondary | ICD-10-CM | POA: Diagnosis present

## 2020-10-02 DIAGNOSIS — E8889 Other specified metabolic disorders: Secondary | ICD-10-CM | POA: Diagnosis present

## 2020-10-02 DIAGNOSIS — S82832A Other fracture of upper and lower end of left fibula, initial encounter for closed fracture: Secondary | ICD-10-CM | POA: Diagnosis present

## 2020-10-02 DIAGNOSIS — I120 Hypertensive chronic kidney disease with stage 5 chronic kidney disease or end stage renal disease: Secondary | ICD-10-CM | POA: Diagnosis not present

## 2020-10-02 DIAGNOSIS — Z992 Dependence on renal dialysis: Secondary | ICD-10-CM

## 2020-10-02 DIAGNOSIS — R52 Pain, unspecified: Secondary | ICD-10-CM

## 2020-10-02 DIAGNOSIS — Z20822 Contact with and (suspected) exposure to covid-19: Secondary | ICD-10-CM | POA: Diagnosis present

## 2020-10-02 DIAGNOSIS — S82432A Displaced oblique fracture of shaft of left fibula, initial encounter for closed fracture: Secondary | ICD-10-CM | POA: Diagnosis present

## 2020-10-02 DIAGNOSIS — R509 Fever, unspecified: Secondary | ICD-10-CM | POA: Diagnosis not present

## 2020-10-02 DIAGNOSIS — W19XXXA Unspecified fall, initial encounter: Secondary | ICD-10-CM | POA: Diagnosis not present

## 2020-10-02 DIAGNOSIS — E875 Hyperkalemia: Secondary | ICD-10-CM | POA: Diagnosis not present

## 2020-10-02 DIAGNOSIS — F05 Delirium due to known physiological condition: Secondary | ICD-10-CM | POA: Diagnosis not present

## 2020-10-02 DIAGNOSIS — E1142 Type 2 diabetes mellitus with diabetic polyneuropathy: Secondary | ICD-10-CM | POA: Diagnosis present

## 2020-10-02 DIAGNOSIS — R531 Weakness: Secondary | ICD-10-CM | POA: Diagnosis not present

## 2020-10-02 DIAGNOSIS — E559 Vitamin D deficiency, unspecified: Secondary | ICD-10-CM | POA: Diagnosis present

## 2020-10-02 DIAGNOSIS — Z794 Long term (current) use of insulin: Secondary | ICD-10-CM | POA: Diagnosis not present

## 2020-10-02 DIAGNOSIS — K5900 Constipation, unspecified: Secondary | ICD-10-CM | POA: Diagnosis not present

## 2020-10-02 DIAGNOSIS — Z87440 Personal history of urinary (tract) infections: Secondary | ICD-10-CM

## 2020-10-02 DIAGNOSIS — Z419 Encounter for procedure for purposes other than remedying health state, unspecified: Secondary | ICD-10-CM

## 2020-10-02 DIAGNOSIS — M25551 Pain in right hip: Secondary | ICD-10-CM | POA: Diagnosis not present

## 2020-10-02 DIAGNOSIS — M546 Pain in thoracic spine: Secondary | ICD-10-CM | POA: Diagnosis not present

## 2020-10-02 DIAGNOSIS — H543 Unqualified visual loss, both eyes: Secondary | ICD-10-CM | POA: Diagnosis present

## 2020-10-02 DIAGNOSIS — S72009A Fracture of unspecified part of neck of unspecified femur, initial encounter for closed fracture: Secondary | ICD-10-CM | POA: Diagnosis present

## 2020-10-02 DIAGNOSIS — Z95828 Presence of other vascular implants and grafts: Secondary | ICD-10-CM

## 2020-10-02 DIAGNOSIS — Z79899 Other long term (current) drug therapy: Secondary | ICD-10-CM | POA: Diagnosis not present

## 2020-10-02 DIAGNOSIS — Z8249 Family history of ischemic heart disease and other diseases of the circulatory system: Secondary | ICD-10-CM

## 2020-10-02 DIAGNOSIS — S82102A Unspecified fracture of upper end of left tibia, initial encounter for closed fracture: Secondary | ICD-10-CM | POA: Clinically undetermined

## 2020-10-02 DIAGNOSIS — E1140 Type 2 diabetes mellitus with diabetic neuropathy, unspecified: Secondary | ICD-10-CM | POA: Diagnosis present

## 2020-10-02 DIAGNOSIS — R34 Anuria and oliguria: Secondary | ICD-10-CM | POA: Diagnosis present

## 2020-10-02 DIAGNOSIS — R400 Somnolence: Secondary | ICD-10-CM | POA: Diagnosis present

## 2020-10-02 DIAGNOSIS — E1122 Type 2 diabetes mellitus with diabetic chronic kidney disease: Secondary | ICD-10-CM | POA: Diagnosis present

## 2020-10-02 DIAGNOSIS — Z90710 Acquired absence of both cervix and uterus: Secondary | ICD-10-CM

## 2020-10-02 DIAGNOSIS — N898 Other specified noninflammatory disorders of vagina: Secondary | ICD-10-CM | POA: Diagnosis not present

## 2020-10-02 DIAGNOSIS — M810 Age-related osteoporosis without current pathological fracture: Secondary | ICD-10-CM

## 2020-10-02 DIAGNOSIS — Z9889 Other specified postprocedural states: Secondary | ICD-10-CM | POA: Diagnosis not present

## 2020-10-02 DIAGNOSIS — S82401A Unspecified fracture of shaft of right fibula, initial encounter for closed fracture: Secondary | ICD-10-CM | POA: Diagnosis present

## 2020-10-02 DIAGNOSIS — E860 Dehydration: Secondary | ICD-10-CM | POA: Diagnosis present

## 2020-10-02 DIAGNOSIS — M25561 Pain in right knee: Secondary | ICD-10-CM

## 2020-10-02 DIAGNOSIS — Z886 Allergy status to analgesic agent status: Secondary | ICD-10-CM

## 2020-10-02 DIAGNOSIS — S82451A Displaced comminuted fracture of shaft of right fibula, initial encounter for closed fracture: Secondary | ICD-10-CM | POA: Diagnosis not present

## 2020-10-02 DIAGNOSIS — T148XXA Other injury of unspecified body region, initial encounter: Secondary | ICD-10-CM

## 2020-10-02 DIAGNOSIS — S82142A Displaced bicondylar fracture of left tibia, initial encounter for closed fracture: Secondary | ICD-10-CM | POA: Diagnosis not present

## 2020-10-02 DIAGNOSIS — S8012XA Contusion of left lower leg, initial encounter: Secondary | ICD-10-CM | POA: Diagnosis present

## 2020-10-02 DIAGNOSIS — E1151 Type 2 diabetes mellitus with diabetic peripheral angiopathy without gangrene: Secondary | ICD-10-CM | POA: Diagnosis present

## 2020-10-02 DIAGNOSIS — W06XXXA Fall from bed, initial encounter: Secondary | ICD-10-CM | POA: Diagnosis present

## 2020-10-02 DIAGNOSIS — R001 Bradycardia, unspecified: Secondary | ICD-10-CM | POA: Diagnosis not present

## 2020-10-02 DIAGNOSIS — Z833 Family history of diabetes mellitus: Secondary | ICD-10-CM

## 2020-10-02 DIAGNOSIS — T1490XA Injury, unspecified, initial encounter: Secondary | ICD-10-CM

## 2020-10-02 DIAGNOSIS — D6489 Other specified anemias: Secondary | ICD-10-CM | POA: Diagnosis not present

## 2020-10-02 DIAGNOSIS — R233 Spontaneous ecchymoses: Secondary | ICD-10-CM | POA: Diagnosis not present

## 2020-10-02 DIAGNOSIS — S82191A Other fracture of upper end of right tibia, initial encounter for closed fracture: Secondary | ICD-10-CM | POA: Diagnosis not present

## 2020-10-02 DIAGNOSIS — M79662 Pain in left lower leg: Secondary | ICD-10-CM | POA: Diagnosis not present

## 2020-10-02 DIAGNOSIS — K760 Fatty (change of) liver, not elsewhere classified: Secondary | ICD-10-CM | POA: Diagnosis present

## 2020-10-02 DIAGNOSIS — I340 Nonrheumatic mitral (valve) insufficiency: Secondary | ICD-10-CM | POA: Diagnosis present

## 2020-10-02 DIAGNOSIS — I70209 Unspecified atherosclerosis of native arteries of extremities, unspecified extremity: Secondary | ICD-10-CM | POA: Diagnosis present

## 2020-10-02 DIAGNOSIS — K219 Gastro-esophageal reflux disease without esophagitis: Secondary | ICD-10-CM | POA: Diagnosis present

## 2020-10-02 HISTORY — DX: Fracture of unspecified part of neck of right femur, initial encounter for closed fracture: S72.001A

## 2020-10-02 HISTORY — PX: HIP PINNING,CANNULATED: SHX1758

## 2020-10-02 LAB — COMPREHENSIVE METABOLIC PANEL
ALT: 17 U/L (ref 0–44)
AST: 23 U/L (ref 15–41)
Albumin: 3.2 g/dL — ABNORMAL LOW (ref 3.5–5.0)
Alkaline Phosphatase: 83 U/L (ref 38–126)
Anion gap: 12 (ref 5–15)
BUN: 38 mg/dL — ABNORMAL HIGH (ref 6–20)
CO2: 23 mmol/L (ref 22–32)
Calcium: 8.6 mg/dL — ABNORMAL LOW (ref 8.9–10.3)
Chloride: 98 mmol/L (ref 98–111)
Creatinine, Ser: 5.38 mg/dL — ABNORMAL HIGH (ref 0.44–1.00)
GFR, Estimated: 9 mL/min — ABNORMAL LOW (ref >60)
Glucose, Bld: 186 mg/dL — ABNORMAL HIGH (ref 70–99)
Potassium: 5.1 mmol/L (ref 3.5–5.1)
Sodium: 133 mmol/L — ABNORMAL LOW (ref 135–145)
Total Bilirubin: 1.3 mg/dL — ABNORMAL HIGH (ref 0.3–1.2)
Total Protein: 6.8 g/dL (ref 6.5–8.1)

## 2020-10-02 LAB — RESP PANEL BY RT-PCR (FLU A&B, COVID) ARPGX2
Influenza A by PCR: NEGATIVE
Influenza B by PCR: NEGATIVE
SARS Coronavirus 2 by RT PCR: NEGATIVE

## 2020-10-02 LAB — CBC WITH DIFFERENTIAL/PLATELET
Abs Immature Granulocytes: 0.04 10*3/uL (ref 0.00–0.07)
Basophils Absolute: 0.1 10*3/uL (ref 0.0–0.1)
Basophils Relative: 1 %
Eosinophils Absolute: 0.1 10*3/uL (ref 0.0–0.5)
Eosinophils Relative: 1 %
HCT: 41 % (ref 36.0–46.0)
Hemoglobin: 12.7 g/dL (ref 12.0–15.0)
Immature Granulocytes: 1 %
Lymphocytes Relative: 18 %
Lymphs Abs: 1.6 10*3/uL (ref 0.7–4.0)
MCH: 27.7 pg (ref 26.0–34.0)
MCHC: 31 g/dL (ref 30.0–36.0)
MCV: 89.5 fL (ref 80.0–100.0)
Monocytes Absolute: 0.7 10*3/uL (ref 0.1–1.0)
Monocytes Relative: 8 %
Neutro Abs: 6.1 10*3/uL (ref 1.7–7.7)
Neutrophils Relative %: 71 %
Platelets: 139 10*3/uL — ABNORMAL LOW (ref 150–400)
RBC: 4.58 MIL/uL (ref 3.87–5.11)
RDW: 18.6 % — ABNORMAL HIGH (ref 11.5–15.5)
WBC: 8.5 10*3/uL (ref 4.0–10.5)
nRBC: 0 % (ref 0.0–0.2)

## 2020-10-02 LAB — GLUCOSE, CAPILLARY
Glucose-Capillary: 174 mg/dL — ABNORMAL HIGH (ref 70–99)
Glucose-Capillary: 175 mg/dL — ABNORMAL HIGH (ref 70–99)
Glucose-Capillary: 190 mg/dL — ABNORMAL HIGH (ref 70–99)

## 2020-10-02 LAB — HEMOGLOBIN A1C
Hgb A1c MFr Bld: 6.5 % — ABNORMAL HIGH (ref 4.8–5.6)
Mean Plasma Glucose: 139.85 mg/dL

## 2020-10-02 LAB — SURGICAL PCR SCREEN
MRSA, PCR: NEGATIVE
Staphylococcus aureus: NEGATIVE

## 2020-10-02 LAB — LACTIC ACID, PLASMA: Lactic Acid, Venous: 1.1 mmol/L (ref 0.5–1.9)

## 2020-10-02 LAB — HEPATITIS B SURFACE ANTIGEN: Hepatitis B Surface Ag: NONREACTIVE

## 2020-10-02 LAB — CBG MONITORING, ED
Glucose-Capillary: 179 mg/dL — ABNORMAL HIGH (ref 70–99)
Glucose-Capillary: 160 mg/dL — ABNORMAL HIGH (ref 70–99)

## 2020-10-02 LAB — TSH: TSH: 5.102 u[IU]/mL — ABNORMAL HIGH (ref 0.350–4.500)

## 2020-10-02 SURGERY — FIXATION, FEMUR, NECK, PERCUTANEOUS, USING SCREW
Anesthesia: General | Site: Hip | Laterality: Right

## 2020-10-02 MED ORDER — CHLORHEXIDINE GLUCONATE CLOTH 2 % EX PADS
6.0000 | MEDICATED_PAD | Freq: Every day | CUTANEOUS | Status: DC
  Administered 2020-10-03 – 2020-10-16 (×7): 6 via TOPICAL

## 2020-10-02 MED ORDER — ONDANSETRON HCL 4 MG/2ML IJ SOLN
INTRAMUSCULAR | Status: AC
  Filled 2020-10-02: qty 2

## 2020-10-02 MED ORDER — ONDANSETRON HCL 4 MG PO TABS
4.0000 mg | ORAL_TABLET | Freq: Four times a day (QID) | ORAL | Status: DC | PRN
  Administered 2020-10-02 – 2020-10-14 (×4): 4 mg via ORAL
  Filled 2020-10-02 (×6): qty 1

## 2020-10-02 MED ORDER — PENTAFLUOROPROP-TETRAFLUOROETH EX AERO: 1.0000 "application " | INHALATION_SPRAY | CUTANEOUS | Status: DC | PRN

## 2020-10-02 MED ORDER — METOCLOPRAMIDE HCL 5 MG PO TABS
5.0000 mg | ORAL_TABLET | Freq: Three times a day (TID) | ORAL | Status: DC | PRN
  Administered 2020-10-05: 5 mg via ORAL
  Administered 2020-10-09: 10 mg via ORAL
  Filled 2020-10-02: qty 2
  Filled 2020-10-02: qty 1

## 2020-10-02 MED ORDER — SUCCINYLCHOLINE CHLORIDE 200 MG/10ML IV SOSY
PREFILLED_SYRINGE | INTRAVENOUS | Status: DC | PRN
  Administered 2020-10-02: 100 mg via INTRAVENOUS

## 2020-10-02 MED ORDER — SODIUM CHLORIDE 0.9 % IV SOLN: INTRAVENOUS | Status: DC

## 2020-10-02 MED ORDER — ONDANSETRON HCL 4 MG/2ML IJ SOLN: 4.0000 mg | Freq: Once | INTRAMUSCULAR | Status: DC

## 2020-10-02 MED ORDER — ACETAMINOPHEN 500 MG PO TABS
500.0000 mg | ORAL_TABLET | Freq: Two times a day (BID) | ORAL | Status: DC
  Filled 2020-10-02 (×2): qty 1

## 2020-10-02 MED ORDER — LIDOCAINE HCL (PF) 1 % IJ SOLN
5.0000 mL | INTRAMUSCULAR | Status: DC | PRN
Start: 2020-10-02 — End: 2020-10-16
  Filled 2020-10-02: qty 5

## 2020-10-02 MED ORDER — LIDOCAINE-PRILOCAINE 2.5-2.5 % EX CREA: 1.0000 "application " | TOPICAL_CREAM | CUTANEOUS | Status: DC | PRN

## 2020-10-02 MED ORDER — SODIUM CHLORIDE 0.9 % IV SOLN: 100.0000 mL | INTRAVENOUS | Status: DC | PRN

## 2020-10-02 MED ORDER — ACETAMINOPHEN 325 MG PO TABS
325.0000 mg | ORAL_TABLET | ORAL | Status: DC | PRN
Start: 2020-10-02 — End: 2020-10-02

## 2020-10-02 MED ORDER — ONDANSETRON HCL 4 MG/2ML IJ SOLN: 4.0000 mg | Freq: Once | INTRAMUSCULAR | Status: DC | PRN

## 2020-10-02 MED ORDER — OXYCODONE HCL 5 MG PO TABS
5.0000 mg | ORAL_TABLET | ORAL | Status: DC | PRN
  Administered 2020-10-04: 5 mg via ORAL
  Filled 2020-10-02: qty 1

## 2020-10-02 MED ORDER — FENTANYL CITRATE (PF) 100 MCG/2ML IJ SOLN: 25.0000 ug | INTRAMUSCULAR | Status: DC | PRN

## 2020-10-02 MED ORDER — ACETAMINOPHEN 160 MG/5ML PO SOLN
325.0000 mg | ORAL | Status: DC | PRN
Start: 2020-10-02 — End: 2020-10-02

## 2020-10-02 MED ORDER — METOCLOPRAMIDE HCL 5 MG/ML IJ SOLN
5.0000 mg | Freq: Three times a day (TID) | INTRAMUSCULAR | Status: DC | PRN
  Administered 2020-10-02 – 2020-10-08 (×3): 10 mg via INTRAVENOUS
  Filled 2020-10-02 (×3): qty 2

## 2020-10-02 MED ORDER — EPHEDRINE SULFATE-NACL 50-0.9 MG/10ML-% IV SOSY
PREFILLED_SYRINGE | INTRAVENOUS | Status: DC | PRN
  Administered 2020-10-02: 15 mg via INTRAVENOUS

## 2020-10-02 MED ORDER — CHLORHEXIDINE GLUCONATE 0.12 % MT SOLN: 15.0000 mL | Freq: Once | OROMUCOSAL | Status: DC

## 2020-10-02 MED ORDER — ONDANSETRON HCL 4 MG PO TABS: 4.0000 mg | ORAL_TABLET | Freq: Four times a day (QID) | ORAL | Status: DC | PRN

## 2020-10-02 MED ORDER — HYDROMORPHONE HCL 1 MG/ML IJ SOLN
0.5000 mg | INTRAMUSCULAR | Status: DC | PRN
  Administered 2020-10-02: 1 mg via INTRAVENOUS
  Filled 2020-10-02: qty 1

## 2020-10-02 MED ORDER — FENTANYL CITRATE (PF) 250 MCG/5ML IJ SOLN
INTRAMUSCULAR | Status: DC | PRN
  Administered 2020-10-02: 50 ug via INTRAVENOUS
  Administered 2020-10-02: 25 ug via INTRAVENOUS

## 2020-10-02 MED ORDER — HYDRALAZINE HCL 20 MG/ML IJ SOLN
20.0000 mg | Freq: Once | INTRAMUSCULAR | Status: AC
  Administered 2020-10-02: 20 mg via INTRAVENOUS

## 2020-10-02 MED ORDER — ONDANSETRON HCL 4 MG/2ML IJ SOLN
4.0000 mg | Freq: Four times a day (QID) | INTRAMUSCULAR | Status: DC | PRN
  Administered 2020-10-02 – 2020-10-12 (×7): 4 mg via INTRAVENOUS
  Filled 2020-10-02 (×9): qty 2

## 2020-10-02 MED ORDER — FENTANYL CITRATE (PF) 250 MCG/5ML IJ SOLN
INTRAMUSCULAR | Status: AC
  Filled 2020-10-02: qty 5

## 2020-10-02 MED ORDER — MIDAZOLAM HCL 2 MG/2ML IJ SOLN
INTRAMUSCULAR | Status: AC
  Filled 2020-10-02: qty 2

## 2020-10-02 MED ORDER — PHENOL 1.4 % MT LIQD: 1.0000 | OROMUCOSAL | Status: DC | PRN

## 2020-10-02 MED ORDER — ALBUMIN HUMAN 5 % IV SOLN: INTRAVENOUS | Status: DC | PRN

## 2020-10-02 MED ORDER — ORAL CARE MOUTH RINSE: 15.0000 mL | Freq: Once | OROMUCOSAL | Status: DC

## 2020-10-02 MED ORDER — ACETAMINOPHEN 325 MG PO TABS: 325.0000 mg | ORAL_TABLET | Freq: Four times a day (QID) | ORAL | Status: DC | PRN

## 2020-10-02 MED ORDER — OXYCODONE HCL 5 MG PO TABS: 5.0000 mg | ORAL_TABLET | Freq: Once | ORAL | Status: DC | PRN

## 2020-10-02 MED ORDER — CEFAZOLIN SODIUM-DEXTROSE 2-4 GM/100ML-% IV SOLN
INTRAVENOUS | Status: AC
  Filled 2020-10-02: qty 100

## 2020-10-02 MED ORDER — MORPHINE SULFATE (PF) 4 MG/ML IV SOLN
4.0000 mg | Freq: Once | INTRAVENOUS | Status: AC
  Administered 2020-10-02: 4 mg via INTRAVENOUS
  Filled 2020-10-02: qty 1

## 2020-10-02 MED ORDER — MEPERIDINE HCL 25 MG/ML IJ SOLN
6.2500 mg | INTRAMUSCULAR | Status: DC | PRN
Start: 2020-10-02 — End: 2020-10-02

## 2020-10-02 MED ORDER — DOCUSATE SODIUM 100 MG PO CAPS
100.0000 mg | ORAL_CAPSULE | Freq: Two times a day (BID) | ORAL | Status: DC
  Administered 2020-10-04 – 2020-10-05 (×3): 100 mg via ORAL
  Filled 2020-10-02 (×4): qty 1

## 2020-10-02 MED ORDER — ONDANSETRON HCL 4 MG/2ML IJ SOLN
4.0000 mg | Freq: Once | INTRAMUSCULAR | Status: AC
  Administered 2020-10-02: 4 mg via INTRAVENOUS
  Filled 2020-10-02: qty 2

## 2020-10-02 MED ORDER — HYDROMORPHONE HCL 1 MG/ML IJ SOLN
0.5000 mg | Freq: Once | INTRAMUSCULAR | Status: AC
  Administered 2020-10-02: 0.5 mg via INTRAVENOUS
  Filled 2020-10-02: qty 1

## 2020-10-02 MED ORDER — 0.9 % SODIUM CHLORIDE (POUR BTL) OPTIME
TOPICAL | Status: DC | PRN
  Administered 2020-10-02: 1000 mL

## 2020-10-02 MED ORDER — INSULIN ASPART 100 UNIT/ML ~~LOC~~ SOLN
0.0000 [IU] | Freq: Three times a day (TID) | SUBCUTANEOUS | Status: DC
  Administered 2020-10-02: 2 [IU] via SUBCUTANEOUS
  Administered 2020-10-04 – 2020-10-08 (×5): 1 [IU] via SUBCUTANEOUS
  Administered 2020-10-09: 2 [IU] via SUBCUTANEOUS
  Administered 2020-10-10 – 2020-10-11 (×2): 1 [IU] via SUBCUTANEOUS

## 2020-10-02 MED ORDER — CEFAZOLIN SODIUM-DEXTROSE 1-4 GM/50ML-% IV SOLN
1.0000 g | Freq: Every day | INTRAVENOUS | Status: AC
  Administered 2020-10-03: 1 g via INTRAVENOUS
  Filled 2020-10-02: qty 50

## 2020-10-02 MED ORDER — CHLORHEXIDINE GLUCONATE 4 % EX LIQD: 60.0000 mL | Freq: Once | CUTANEOUS | Status: DC

## 2020-10-02 MED ORDER — LIDOCAINE 2% (20 MG/ML) 5 ML SYRINGE
INTRAMUSCULAR | Status: DC | PRN
  Administered 2020-10-02: 50 mg via INTRAVENOUS

## 2020-10-02 MED ORDER — OXYCODONE HCL 5 MG/5ML PO SOLN: 5.0000 mg | Freq: Once | ORAL | Status: DC | PRN

## 2020-10-02 MED ORDER — ONDANSETRON HCL 4 MG/2ML IJ SOLN: 4.0000 mg | Freq: Four times a day (QID) | INTRAMUSCULAR | Status: DC | PRN

## 2020-10-02 MED ORDER — HYDRALAZINE HCL 20 MG/ML IJ SOLN
INTRAMUSCULAR | Status: AC
  Filled 2020-10-02: qty 1

## 2020-10-02 MED ORDER — POVIDONE-IODINE 10 % EX SWAB: 2.0000 "application " | Freq: Once | CUTANEOUS | Status: DC

## 2020-10-02 MED ORDER — MORPHINE SULFATE (PF) 2 MG/ML IV SOLN
0.5000 mg | INTRAVENOUS | Status: DC | PRN
  Administered 2020-10-02 (×2): 1 mg via INTRAVENOUS
  Filled 2020-10-02 (×2): qty 1

## 2020-10-02 MED ORDER — MENTHOL 3 MG MT LOZG: 1.0000 | LOZENGE | OROMUCOSAL | Status: DC | PRN

## 2020-10-02 MED ORDER — HEPARIN SODIUM (PORCINE) 1000 UNIT/ML DIALYSIS: 1000.0000 [IU] | INTRAMUSCULAR | Status: DC | PRN

## 2020-10-02 MED ORDER — ONDANSETRON HCL 4 MG/2ML IJ SOLN
INTRAMUSCULAR | Status: AC
  Administered 2020-10-02: 4 mg
  Filled 2020-10-02: qty 2

## 2020-10-02 MED ORDER — CEFAZOLIN SODIUM-DEXTROSE 2-4 GM/100ML-% IV SOLN
2.0000 g | INTRAVENOUS | Status: AC
  Administered 2020-10-02: 2 g via INTRAVENOUS

## 2020-10-02 MED ORDER — HEPARIN SODIUM (PORCINE) 5000 UNIT/ML IJ SOLN
5000.0000 [IU] | Freq: Three times a day (TID) | INTRAMUSCULAR | Status: DC
  Administered 2020-10-02 – 2020-10-16 (×39): 5000 [IU] via SUBCUTANEOUS
  Filled 2020-10-02 (×40): qty 1

## 2020-10-02 MED ORDER — PROPOFOL 10 MG/ML IV BOLUS
INTRAVENOUS | Status: AC
  Filled 2020-10-02: qty 20

## 2020-10-02 MED ORDER — PROPOFOL 10 MG/ML IV BOLUS
INTRAVENOUS | Status: DC | PRN
Start: 2020-10-02 — End: 2020-10-02
  Administered 2020-10-02: 100 mg via INTRAVENOUS

## 2020-10-02 MED ORDER — ALTEPLASE 2 MG IJ SOLR: 2.0000 mg | Freq: Once | INTRAMUSCULAR | Status: DC | PRN

## 2020-10-02 SURGICAL SUPPLY — 45 items
BIT DRILL 4.8X200 CANN (BIT) ×2 IMPLANT
BNDG COHESIVE 6X5 TAN STRL LF (GAUZE/BANDAGES/DRESSINGS) ×2 IMPLANT
BRUSH SCRUB EZ PLAIN DRY (MISCELLANEOUS) ×2 IMPLANT
CHLORAPREP W/TINT 26 (MISCELLANEOUS) ×2 IMPLANT
COVER SURGICAL LIGHT HANDLE (MISCELLANEOUS) ×4 IMPLANT
COVER WAND RF STERILE (DRAPES) ×2 IMPLANT
DERMABOND ADVANCED (GAUZE/BANDAGES/DRESSINGS) ×1
DERMABOND ADVANCED .7 DNX12 (GAUZE/BANDAGES/DRESSINGS) ×1 IMPLANT
DRAPE C-ARMOR (DRAPES) ×2 IMPLANT
DRAPE HALF SHEET 40X57 (DRAPES) ×4 IMPLANT
DRAPE IMP U-DRAPE 54X76 (DRAPES) ×4 IMPLANT
DRAPE ORTHO SPLIT 77X108 STRL (DRAPES) ×2
DRAPE STERI IOBAN 125X83 (DRAPES) ×2 IMPLANT
DRAPE SURG 17X23 STRL (DRAPES) ×2 IMPLANT
DRAPE SURG ORHT 6 SPLT 77X108 (DRAPES) ×2 IMPLANT
DRAPE U-SHAPE 47X51 STRL (DRAPES) ×2 IMPLANT
DRSG MEPILEX BORDER 4X4 (GAUZE/BANDAGES/DRESSINGS) ×2 IMPLANT
DRSG MEPILEX BORDER 4X8 (GAUZE/BANDAGES/DRESSINGS) ×2 IMPLANT
ELECT REM PT RETURN 9FT ADLT (ELECTROSURGICAL) ×2
ELECTRODE REM PT RTRN 9FT ADLT (ELECTROSURGICAL) ×1 IMPLANT
GLOVE BIO SURGEON STRL SZ 6.5 (GLOVE) ×6 IMPLANT
GLOVE BIO SURGEON STRL SZ7.5 (GLOVE) ×8 IMPLANT
GLOVE BIOGEL PI IND STRL 7.5 (GLOVE) ×1 IMPLANT
GLOVE BIOGEL PI INDICATOR 7.5 (GLOVE) ×1
GLOVE SURG UNDER POLY LF SZ6.5 (GLOVE) ×2 IMPLANT
GOWN STRL REUS W/ TWL LRG LVL3 (GOWN DISPOSABLE) ×2 IMPLANT
GOWN STRL REUS W/TWL LRG LVL3 (GOWN DISPOSABLE) ×2
KIT BASIN OR (CUSTOM PROCEDURE TRAY) ×2 IMPLANT
KIT TURNOVER KIT B (KITS) ×2 IMPLANT
MANIFOLD NEPTUNE II (INSTRUMENTS) ×2 IMPLANT
NS IRRIG 1000ML POUR BTL (IV SOLUTION) ×2 IMPLANT
PACK GENERAL/GYN (CUSTOM PROCEDURE TRAY) ×2 IMPLANT
PAD ARMBOARD 7.5X6 YLW CONV (MISCELLANEOUS) ×4 IMPLANT
PIN GUIDE DRILL TIP 2.8X300 (DRILL) ×8 IMPLANT
SCREW 8.0X80MMX16 (Screw) ×2 IMPLANT
SCREW CANN 8.0X85 HIP (Screw) ×2 IMPLANT
SCREW CANNULATED 8.0X65 (Screw) ×2 IMPLANT
STAPLER VISISTAT 35W (STAPLE) ×2 IMPLANT
STOCKINETTE IMPERVIOUS LG (DRAPES) ×2 IMPLANT
SUT MNCRL AB 3-0 PS2 18 (SUTURE) ×2 IMPLANT
SUT VIC AB 2-0 CT1 27 (SUTURE) ×1
SUT VIC AB 2-0 CT1 TAPERPNT 27 (SUTURE) ×1 IMPLANT
TOWEL GREEN STERILE (TOWEL DISPOSABLE) ×4 IMPLANT
TOWEL GREEN STERILE FF (TOWEL DISPOSABLE) ×2 IMPLANT
WATER STERILE IRR 1000ML POUR (IV SOLUTION) ×2 IMPLANT

## 2020-10-02 NOTE — Anesthesia Preprocedure Evaluation (Addendum)
Anesthesia Evaluation  Patient identified by MRN, date of birth, ID band Patient awake    Reviewed: Allergy & Precautions, H&P , NPO status , Patient's Chart, lab work & pertinent test results, reviewed documented beta blocker date and time   Airway Mallampati: II  TM Distance: >3 FB Neck ROM: full    Dental no notable dental hx. (+) Teeth Intact, Dental Advisory Given, Poor Dentition   Pulmonary neg pulmonary ROS,    Pulmonary exam normal breath sounds clear to auscultation       Cardiovascular Exercise Tolerance: Good hypertension, Pt. on medications negative cardio ROS   Rhythm:regular Rate:Normal     Neuro/Psych  Headaches, negative psych ROS   GI/Hepatic Neg liver ROS, GERD  Medicated,  Endo/Other  negative endocrine ROSdiabetes  Renal/GU ESRFRenal disease  negative genitourinary   Musculoskeletal  (+) Arthritis , Osteoarthritis,    Abdominal   Peds  Hematology negative hematology ROS (+)   Anesthesia Other Findings   Reproductive/Obstetrics negative OB ROS                            Anesthesia Physical Anesthesia Plan  ASA: IV and emergent  Anesthesia Plan: General   Post-op Pain Management:    Induction: Intravenous and Cricoid pressure planned  PONV Risk Score and Plan: 3 and Treatment may vary due to age or medical condition  Airway Management Planned: Oral ETT  Additional Equipment: None  Intra-op Plan:   Post-operative Plan: Extubation in OR  Informed Consent: I have reviewed the patients History and Physical, chart, labs and discussed the procedure including the risks, benefits and alternatives for the proposed anesthesia with the patient or authorized representative who has indicated his/her understanding and acceptance.     Dental Advisory Given  Plan Discussed with: CRNA, Anesthesiologist and Surgeon  Anesthesia Plan Comments:        Anesthesia  Quick Evaluation

## 2020-10-02 NOTE — ED Notes (Signed)
O2 at 2LPM initiated for comfort.

## 2020-10-02 NOTE — ED Notes (Signed)
Pt appears calm at this time and denies nausea at this time.

## 2020-10-02 NOTE — ED Notes (Addendum)
Morphine '4mg'$  IVP given while xray in process. Pt unable to tolerate. Jefferey, Stanley notified.

## 2020-10-02 NOTE — Consult Note (Signed)
Reason for Consult:Right hip fx Referring Physician: Robyn Haber Time called: [id number] Time at bedside: [name] [name] is an 55 y.o. female.  HPI: Berdie was at home. Her aide was helping her out of bed when she fell and the aide could not stop her. She fell to the floor and had immediate right hip pain. She could not get up or bear weight. She was brought to the ED where x-rays showed a right femoral neck fx and orthopedic surgery was consulted. She also had a FUO and is undergoing workup. She lives at home with in-home care and ambulates some with a RW.  Past Medical History:  Diagnosis Date  . Colitis   . Diabetes mellitus   . Family history of adverse reaction to anesthesia    " my uncle's heart stoped "  . GERD (gastroesophageal reflux disease)   . Heart murmur   . Hypertension   . 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 Mitral regurgitation   . Neuropathy in diabetes ([place])   . Syncope and collapse 07/28/2015    Past Surgical History:  Procedure Laterality Date  . ABDOMINAL HYSTERECTOMY    . CATARACT EXTRACTION    . CESAREAN SECTION    . CHOLECYSTECTOMY    . EYE SURGERY Left retinal  . GANGLION CYST EXCISION    . TONSILLECTOMY    . TUBAL LIGATION      Family History  Problem Relation Age of Onset  . Diabetes Father   . Heart attack Father   . Aneurysm Mother     Social History:  reports that she has never smoked. She has never used smokeless tobacco. She reports that she does not drink alcohol and does not use drugs.  Allergies:  Allergies  Allergen Reactions  . Amitriptyline Swelling    Other reaction(s): Confusion (intolerance)  . Ciprofloxacin Diarrhea and Nausea And Vomiting    VOMITING Severe vomiting requiring ED visit, IV reglan and IV fluids VOMITING   . Semaglutide Nausea Only  . Tramadol Itching  . Tylenol [Acetaminophen] Itching    Medications: I have reviewed the patient's current medications.  No results found for this or any previous  visit (from the past 48 hour(s)).  DG Chest 1 View  Result Date: 10/02/2020 CLINICAL DATA:  55 year old female status post fall with right femoral neck fracture. EXAM: CHEST  1 VIEW COMPARISON:  Chest radiographs 08/13/2020 and earlier. FINDINGS: Portable AP supine view at 0627 hours. Lung volumes and mediastinal contours are stable and within normal limits. Visualized tracheal air column is within normal limits. Stable left chest dual lumen dialysis type catheter. There is streaky peribronchial opacity at the left lung base. Elsewhere when Allowing for portable technique the lungs are clear. No pneumothorax or pleural effusion. Negative visible bowel gas pattern. Stable visualized osseous structures. IMPRESSION: Streaky left lower lung opacity most resembling atelectasis. No other acute cardiopulmonary abnormality. Electronically Signed   By: Genevie Ann M.D.   On: 10/02/2020 06:42   DG Ankle Complete Right  Result Date: 10/02/2020 CLINICAL DATA:  55 year old female status post fall with pain. EXAM: RIGHT ANKLE - COMPLETE 3+ VIEW COMPARISON:  None. FINDINGS: Osteopenia. There is no evidence of fracture, dislocation, or joint effusion. Degenerative spurring at the plantar calcaneus. No acute osseous abnormality identified. No discrete soft tissue injury. IMPRESSION: No acute fracture or dislocation identified about the right ankle. Electronically Signed   By: Genevie Ann M.D.   On: 10/02/2020 06:41  DG Hip Unilat W or Wo Pelvis 2-3 Views Right  Result Date: 10/02/2020 CLINICAL DATA:  55 year old female status post fall with right hip pain. EXAM: DG HIP (WITH OR WITHOUT PELVIS) 2-3V RIGHT COMPARISON:  CT Abdomen and Pelvis 03/06/2020. FINDINGS: Femoral heads normally located. Pelvis mildly rotated to the right. Chronic pubic symphysis degeneration. Pelvis appears stable and intact. SI joints remain symmetric. Grossly intact proximal left femur. Subtle nondisplaced right femoral neck fracture (image 2).  Intertrochanteric segment appears intact. Extensive calcified iliofemoral atherosclerosis. Negative lower abdominal and pelvic visceral contours. IMPRESSION: Positive for a nondisplaced fracture of the right femoral neck. Electronically Signed   By: Genevie Ann M.D.   On: 10/02/2020 06:40    Review of Systems  HENT: Negative for ear discharge, ear pain, hearing loss and tinnitus.   Eyes: Negative for photophobia and pain.  Respiratory: Negative for cough and shortness of breath.   Cardiovascular: Negative for chest pain.  Gastrointestinal: Negative for abdominal pain, nausea and vomiting.  Genitourinary: Negative for dysuria, flank pain, frequency and urgency.  Musculoskeletal: Positive for arthralgias (Right hip). Negative for back pain, myalgias and neck pain.  Neurological: Negative for dizziness and headaches.  Hematological: Does not bruise/bleed easily.  Psychiatric/Behavioral: The patient is not nervous/anxious.    Blood pressure (!) 169/85, pulse 70, temperature 99.6 F (37.6 C), temperature source Oral, resp. rate 14, height 5' (1.524 m), weight 62 kg, SpO2 99 %. Physical Exam Constitutional:      General: She is not in acute distress.    Appearance: She is well-developed. She is not diaphoretic.  HENT:     Head: Normocephalic and atraumatic.  Eyes:     General: No scleral icterus.       Right eye: No discharge.        Left eye: No discharge.     Conjunctiva/sclera: Conjunctivae normal.  Cardiovascular:     Rate and Rhythm: Normal rate and regular rhythm.  Pulmonary:     Effort: Pulmonary effort is normal. No respiratory distress.  Musculoskeletal:     Cervical back: Normal range of motion.     Comments: RLE No traumatic wounds, ecchymosis, or rash  Severe TTP, mod TTP knee  No knee or ankle effusion  Knee stable to varus/ valgus and anterior/posterior stress  Sens DPN, SPN, TN intact  Motor EHL, ext, flex, evers 5/5  DP 1+, PT 1+, No significant edema  Skin:     General: Skin is warm and dry.  Neurological:     Mental Status: She is alert.  Psychiatric:        Behavior: Behavior normal.     Assessment/Plan: Right hip fx -- Will do cannulated hip pinning today by Dr. Doreatha Martin. Please keep NPO. Right knee pain -- X-rays pending Multiple medical problems including DM, ESRD on HD, and diabetic neuropathy -- Medicine to admit, manage, and clear. Appreciate their help.    Lisette Abu, PA-C Orthopedic Surgery [phone] 10/02/2020, 8:43 AM

## 2020-10-02 NOTE — H&P (Addendum)
Date: 10/02/2020               Patient Name:  [name] [name] MRN: [id number]  DOB: [birth year] Age / Sex: 55 y.o., female   PCP: Myrtis Hopping., MD         Medical Service: Internal Medicine Teaching Service         Attending Physician: Dr. Aldine Contes, MD    First Contact: Dr. Konrad Penta Pager: [phone]  Second Contact: Dr. Darrick Meigs Pager: [phone]       After Hours (After 5p/  First Contact Pager: [phone]  weekends / holidays): Second Contact Pager: [phone]   Chief Complaint: Fall  History of Present Illness: This is a 55 year old female with a history of ESRD on HD MWF, diabetes type 2, hypertension, NAFLD, GERD, history of recurrent UTIs, and chronic bilateral lower extremity weakness who is presenting after a fall where after returning from dialysis she was trying to get into bed, the aide was trying to help her and she slid off the bed. Her right leg was bent under her. She has significant pain in her left hip and ankle, and was unable to stand after the fall.  She denied any symptoms prior to the fall.  Her only symptoms have been a occasional headache over the past few weeks, denied any neck stiffness or worsening weakness. Denied any loss of consciousness.  She denied any chest pain, shortness of breath, cough, fevers, chills, nausea, vomiting, abdominal pain, diarrhea, constipation, hematuria, dysuria, or other symptoms.  She states that she has been eating and drinking fine with no issues.  She went to dialysis yesterday and states she had no complications there.  On arrival to the ED she was noted to be febrile to 101.3, regular respiratory rate and heart rate, and BP up to 182/76, dropped to 106/67.  Her labs showed a WBC of 8.5, hemoglobin 12.7, platelets 139.  BMP showed sodium 133, potassium 5.1, creatinine 5.3, glucose 186, T bili 1.3.  Lactic acid 1.1.  Urinalysis ordered, blood cultures x2 obtained.  Chest x-ray showed mild cardiomegaly, left basilar atelectasis, and  double-lumen port in place.  Hip/pelvis x-ray showed nondisplaced fracture of the right femoral neck.  Right ankle x-ray showed no fracture.  Patient was in significant pain prior to the x-ray, she received 4 mg IV morphine x2 and 0.5 mg IV Dilaudid.  Orthopedics was consulted and evaluated, plan for cannulated hip pinning today.  Nephrology was consulted.  I MTS consulted for admission.  Meds:  Current Meds  Medication Sig  . amLODipine (NORVASC) 10 MG tablet Take 10 mg by mouth daily.  . Ascorbic Acid (VITAMIN C) 100 MG tablet Take 100 mg by mouth daily.  Marland Kitchen atropine 1 % ophthalmic solution Place 1 drop into the right eye 3 (three) times daily.  . B Complex-C (B-COMPLEX WITH VITAMIN C) tablet Take 1 tablet by mouth daily.  Marland Kitchen BISACODYL PO Take 5 mg by mouth daily as needed (constipation).  . carvedilol (COREG) 25 MG tablet Take 25 mg by mouth 2 (two) times daily.  Marland Kitchen gabapentin (NEURONTIN) 100 MG capsule Take 200 mg by mouth at bedtime as needed (pain).  Marland Kitchen gabapentin (NEURONTIN) 100 MG capsule Take 100 mg by mouth daily as needed (pain).  . hydrALAZINE (APRESOLINE) 25 MG tablet Take 3 tablets (75 mg total) by mouth every 8 (eight) hours. (Patient taking differently: Take 25 mg by mouth every 8 (eight) hours.)  . Insulin Glargine  300 UNIT/ML SOPN Inject 13 Units into the skin daily. (Patient taking differently: Inject 10 Units into the skin daily.)  . insulin lispro (HUMALOG) 100 UNIT/ML injection Inject 2-14 Units into the skin daily. Per sliding scale: BG  201-250= 2 units, 251-300 = 4 units, 301-350= 6 units, 351-400= 8 units, 401-450= 10 units, 451-500 = 12 units, 500 or greater = 14 units.  . isosorbide dinitrate (ISORDIL) 20 MG tablet Take 20 mg by mouth 3 (three) times daily.  Marland Kitchen LACTOBACILLUS EXTRA STRENGTH PO Take 1 capsule by mouth daily.  . Magnesium 400 MG TABS Take 400 mg by mouth 2 (two) times daily.  . methocarbamol (ROBAXIN) 500 MG tablet Take 1 tablet (500 mg total) by mouth every 6  (six) hours as needed for muscle spasms (headache).  . oxyCODONE (ROXICODONE) 5 MG immediate release tablet Take 1 tablet (5 mg total) by mouth every 6 (six) hours as needed for severe pain.  . polyethylene glycol (MIRALAX / GLYCOLAX) 17 g packet Take 17 g by mouth daily as needed for mild constipation.   . sennosides-docusate sodium (SENOKOT-S) 8.6-50 MG tablet Take 1 tablet by mouth daily as needed for constipation.   . SUMAtriptan (IMITREX) 100 MG tablet Take 1 tablet (100 mg total) by mouth once as needed for migraine. May repeat in 2 hours if headache persists or recurs.     Allergies: Allergies as of 10/02/2020 - Review Complete 10/02/2020  Allergen Reaction Noted  . Amitriptyline Swelling 11/10/2019  . Ciprofloxacin Diarrhea and Nausea And Vomiting 10/27/2018  . Semaglutide Nausea Only 10/20/2018  . Tramadol Itching 05/31/2019  . Tylenol [acetaminophen] Itching 06/17/2014   Past Medical History:  Diagnosis Date  . Colitis   . Diabetes mellitus   . Family history of adverse reaction to anesthesia    " my uncle's heart stoped "  . GERD (gastroesophageal reflux disease)   . Heart murmur   . Hypertension   . 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 Mitral regurgitation   . Neuropathy in diabetes ([place])   . Syncope and collapse 07/28/2015    Family History:  Family History  Problem Relation Age of Onset  . Diabetes Father   . Heart attack Father   . Aneurysm Mother     Social History: Patient denied any smoking, alcohol, or recreational drug use.  She lives in [place] with her husband.  Review of Systems: A complete ROS was negative except as per HPI.   Physical Exam: Blood pressure 106/67, pulse 70, temperature 99.6 F (37.6 C), temperature source Oral, resp. rate (!) 7, height 5' (1.524 m), weight 62 kg, SpO2 96 %. Physical Exam Constitutional:      Comments: Middle aged female, NAD, fatigued appearing, falling asleep  HENT:     Head: Normocephalic and  atraumatic.     Mouth/Throat:     Mouth: Mucous membranes are moist.     Pharynx: Oropharynx is clear. No oropharyngeal exudate.  Cardiovascular:     Rate and Rhythm: Normal rate and regular rhythm.     Pulses: Normal pulses.     Heart sounds: Normal heart sounds. No murmur heard.     Comments: Left subclavian port in place, no erythema or drainage around area Pulmonary:     Effort: Pulmonary effort is normal. No respiratory distress.     Breath sounds: Normal breath sounds.  Abdominal:     General: Abdomen is flat. Bowel sounds are normal.     Palpations: Abdomen is  soft.     Tenderness: There is no abdominal tenderness.  Musculoskeletal:     Cervical back: Normal range of motion and neck supple.     Comments: Right hip significantly tender to touch, everted, no noticeable difference in length  Skin:    General: Skin is warm and dry.     Capillary Refill: Capillary refill takes less than 2 seconds.     Comments: Generalized diaphoresis  Neurological:     Comments: Oriented x3, intermittently falling asleep, able to follow commands, CN grossly intact, able to wiggle bilateral toes,  Psychiatric:        Mood and Affect: Mood normal.        Behavior: Behavior normal.      EKG: personally reviewed my interpretation is normal sinus rhythm, heart rate 87, isolated T wave inversion in aVL, no acute ST changes  CXR: personally reviewed my interpretation is mild cardiomegaly, left basilar atelectasis, and double-lumen port in place  Assessment & Plan by Problem: Active Problems:   Hip fracture (HCC)  Fever of unknown origin: Patient presenting after a mechanical fall resulting in a left femoral neck fracture.  She denied any infectious symptoms, denied cough, shortness of breath, chest pain, abdominal pain, diarrhea, dysuria, fevers, chills, or other symptoms.  Her only other complaint was an occasional mild headache over the past few weeks, denied any neck stiffness. 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  However she denies any dysuria, hematuria, urinary frequency, abdominal pain, or other urinary symptoms. -Temp 101.3 on arrival does not appear that she received Tylenol, repeat temperature was 99.6.  Her white count was 8.5.  Lactic acid 1.1.   -On exam patient does appear diaphoretic, cardiac, pulmonary, and abdominal exam all unremarkable. Area around her port shows no erythema, edema, drainage or other signs of infection, patient reported having HD yesterday with no issues.  Patient is currently on 2 L nasal cannula for a reported O2 of 86%, however is currently saturating at 100% on this and may be able to be weaned off.  -Urinalysis is pending.  Blood cultures have been obtained and is pending.  Her chest x-ray shows some left basilar atelectasis, no evidence of infiltrates or pleural effusions noted.  Will follow up cultures and monitor for potential sources.  -Daily CBC -F/u urinalysis -F/u blood cultures -Hold on antibiotics for now -Monitor fever curve -Check TSH -Tylenol PRN -Treat pain as below  Right hip fracture: Mechanical fall: Osteoporosis: Patient reporting after a mechanical fall from bed height. Denied any loss of consciousness or presyncopal symptoms.  Patient developed significant left hip pain and left ankle pain following a fall.  X-ray consistent with a left femoral neck fracture, ankle x-ray showed no evidence of fracture.  She has received 4 mg IV morphine x2 and 0.5 mg IV Dilaudid.  On exam patient does appear very tired and intermittently will fall asleep she is oxygenating well and breathing comfortably however will monitor her respiratory status closely. Orthopedics is on board. 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 Last Vit D level 16 on 08/02/20.   -Ortho following, planning cannulated hip pinning today -NPO -Pain management with Dilaudid 0.5 mg  every 2 as needed -We will plan on scheduling Tylenol following procedure  ESRD on HD: Patient underwent dialysis yesterday via her left chest port, denies any issues with dialysis.  Her electrolytes are relatively stable.  Does not appear  significantly volume overloaded on exam.  Dialysis port shows no evidence of infection.  No emergent need for dialysis at this time.   -Nephrology consulted, appreciate recommendations -Daily BMP  DM: Patient is on insulin glargine 13 units daily and insulin aspart 2-14 units with meals.  Her glucose on arrival around 180s.  Currently is n.p.o. for planned surgery. -Check A1c -SSI-S -Frequent CBGs -We will resume gabapentin once diet is resumed -N.p.o.  HTN: Patient is on amlodipine, carvedilol, hydralazine, isosorbil at home.  Blood pressure on arrival was elevated around 192/93, however patient is in significant distress and pain from her left hip fracture.  Blood pressure dropped down to 106/67 on last check.  -Hold home amlodipine, carvedilol, hydralazine, and isosordil -Resume as tolerated  GERD: Patient does not appear to be on any medications at this time.  We will continue to monitor for now.  FEN: None, replete lytes prn, NPO VTE ppx: SCDS Code Status: FULL  Dispo: Admit patient to Inpatient with expected length of stay greater than 2 midnights.  Signed: Asencion Noble, MD 10/02/2020, 11:05 AM  Pager: [phone] After 5pm on weekdays and 1pm on weekends: On Call pager: [phone]

## 2020-10-02 NOTE — ED Notes (Signed)
Attempted to reach IV team multiple times with no success. Order for IV placement in place. MD aware that RN is unable to place and IV.

## 2020-10-02 NOTE — ED Notes (Signed)
Patient transported to X-ray 

## 2020-10-02 NOTE — ED Notes (Signed)
Pt is asking for more pain meds. MD notified. Waiting for new orders.

## 2020-10-02 NOTE — Progress Notes (Signed)
Pt arrived to unit. Pt will arouse when spoken to. She has spoken a few words when asked questions, but very sleepy. Vital signs are stable. Pt is alert and oriented when asked questions. Denies any pain. Night shift will take over shortly, will continue to monitor.

## 2020-10-02 NOTE — ED Notes (Signed)
CBG collected @ 13:33, result was 160.

## 2020-10-02 NOTE — ED Notes (Signed)
Report given to Short Stay RN, ED tech taking pt to short stay

## 2020-10-02 NOTE — Transfer of Care (Signed)
Immediate Anesthesia Transfer of Care Note  Patient: [name] [name]  Procedure(s) Performed: CANNULATED HIP PINNING (Right Hip)  Patient Location: PACU  Anesthesia Type:General  Level of Consciousness: drowsy, patient cooperative and responds to stimulation  Airway & Oxygen Therapy: Patient Spontanous Breathing and Patient connected to nasal cannula oxygen  Post-op Assessment: Report given to RN and Post -op Vital signs reviewed and stable  Post vital signs: Reviewed and stable  Last Vitals:  Vitals Value Taken Time  BP 163/77 10/02/20 1737  Temp    Pulse 79 10/02/20 1739  Resp 0 10/02/20 1739  SpO2 99 % 10/02/20 1739  Vitals shown include unvalidated device data.  Last Pain:  Vitals:   10/02/20 1444  TempSrc: Axillary  PainSc: Asleep         Complications: No complications documented.

## 2020-10-02 NOTE — ED Provider Notes (Signed)
[facility] EMERGENCY DEPARTMENT Provider Note   CSN: [id number] Arrival date & time: 10/02/20  [id number]     History Chief Complaint  Patient presents with  . Fall  . Hip Pain    [name] [name] is a 55 y.o. female.  HPI Patient presents with right hip pain after fall.  Last night was getting ready for bed and had a fall.  Pain in right hip and right ankle.  She is a dialysis patient was dialyzed yesterday.  Found to have a fever upon arrival.  No cough.  No shortness of breath.  Reportedly did have Covid 2 months ago but states she had no symptoms.  Patient is unvaccinated.  Did not hit head.  She is dialyzed through a left chest wall catheter.  Does not see [facility] for dialysis.    Past Medical History:  Diagnosis Date  . Colitis   . Diabetes mellitus   . Family history of adverse reaction to anesthesia    " my uncle's heart stoped "  . GERD (gastroesophageal reflux disease)   . Heart murmur   . Hypertension   . 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 Mitral regurgitation   . Neuropathy in diabetes ([place])   . Syncope and collapse 07/28/2015    Patient Active Problem List   Diagnosis Date Noted  . Pressure injury of skin 03/12/2020  . Sepsis ([place]) 03/07/2020  . ESRD (end stage renal disease) ([place]) 03/07/2020  . Hyperkalemia 03/07/2020  . Acute cystitis 03/07/2020  . Acute metabolic encephalopathy [id number]  . 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 Diabetes mellitus with complication ([place])   . Intractable headache   . Syncope 07/28/2015  . Chronic headaches 07/28/2015  . Chest pain 05/22/2015  . Urinary frequency 03/13/2015  . Easy bruising 10/09/2014  . Encounter for preventative adult health care exam with abnormal findings 01/11/2014  . Chronic constipation 01/11/2014  . GERD (gastroesophageal reflux disease) 01/11/2014  . Helicobacter positive gastritis 12/06/2013  . Dizziness and giddiness 10/12/2013  . Essential hypertension  09/19/2013  . Back pain 06/11/2013  . Lumbosacral spondylosis without myelopathy 02/20/2013  . Diabetic neuropathy ([place]) 01/09/2013  . DM (diabetes mellitus), type 2, uncontrolled ([place]) 10/02/2012  . MITRAL REGURGITATION, mild 10/31/2009  . PLEURAL EFFUSION, RIGHT after pyelonephritis in April 2011 (no intervention) 10/20/2009    Past Surgical History:  Procedure Laterality Date  . ABDOMINAL HYSTERECTOMY    . CATARACT EXTRACTION    . CESAREAN SECTION    . CHOLECYSTECTOMY    . EYE SURGERY Left retinal  . GANGLION CYST EXCISION    . TONSILLECTOMY    . TUBAL LIGATION       OB History   No obstetric history on file.     Family History  Problem Relation Age of Onset  . Diabetes Father   . Heart attack Father   . Aneurysm Mother     Social History   Tobacco Use  . Smoking status: Never Smoker  . Smokeless tobacco: Never Used  Substance Use Topics  . Alcohol use: No    Alcohol/week: 0.0 standard drinks  . Drug use: No    Home Medications Prior to Admission medications   Medication Sig Start Date End Date Taking? Authorizing Provider  amLODipine (NORVASC) 10 MG tablet Take 10 mg by mouth daily. 02/25/20  Yes [provider]  Ascorbic Acid (VITAMIN C) 100 MG tablet Take 100 mg by mouth daily.   Yes  [provider]  atropine 1 % ophthalmic solution Place 1 drop into the right eye 3 (three) times daily.   Yes [provider]  B Complex-C (B-COMPLEX WITH VITAMIN C) tablet Take 1 tablet by mouth daily.   Yes [provider]  BISACODYL PO Take 5 mg by mouth daily as needed (constipation).   Yes [provider]  carvedilol (COREG) 25 MG tablet Take 25 mg by mouth 2 (two) times daily. 03/04/20  Yes [provider]  gabapentin (NEURONTIN) 100 MG capsule Take 200 mg by mouth at bedtime as needed (pain).   Yes [provider]  gabapentin (NEURONTIN) 100 MG capsule Take 100 mg by mouth daily as needed (pain).   Yes  [provider]  hydrALAZINE (APRESOLINE) 25 MG tablet Take 3 tablets (75 mg total) by mouth every 8 (eight) hours. Patient taking differently: Take 25 mg by mouth every 8 (eight) hours. 06/03/20 09/01/20 Yes [place] Eric J, DO  Insulin Glargine 300 UNIT/ML SOPN Inject 13 Units into the skin daily. Patient taking differently: Inject 10 Units into the skin daily. 03/15/20  Yes Dessa Phi, DO  insulin lispro (HUMALOG) 100 UNIT/ML injection Inject 2-14 Units into the skin daily. Per sliding scale: BG  201-250= 2 units, 251-300 = 4 units, 301-350= 6 units, 351-400= 8 units, 401-450= 10 units, 451-500 = 12 units, 500 or greater = 14 units.   Yes [provider]  isosorbide dinitrate (ISORDIL) 20 MG tablet Take 20 mg by mouth 3 (three) times daily.   Yes [provider]  LACTOBACILLUS EXTRA STRENGTH PO Take 1 capsule by mouth daily.   Yes [provider]  Magnesium 400 MG TABS Take 400 mg by mouth 2 (two) times daily.   Yes [provider]  methocarbamol (ROBAXIN) 500 MG tablet Take 1 tablet (500 mg total) by mouth every 6 (six) hours as needed for muscle spasms (headache). 06/03/20  Yes [place] Eric J, DO  oxyCODONE (ROXICODONE) 5 MG immediate release tablet Take 1 tablet (5 mg total) by mouth every 6 (six) hours as needed for severe pain. 03/15/20  Yes Dessa Phi, DO  polyethylene glycol (MIRALAX / GLYCOLAX) 17 g packet Take 17 g by mouth daily as needed for mild constipation.    Yes [provider]  sennosides-docusate sodium (SENOKOT-S) 8.6-50 MG tablet Take 1 tablet by mouth daily as needed for constipation.    Yes [provider]  SUMAtriptan (IMITREX) 100 MG tablet Take 1 tablet (100 mg total) by mouth once as needed for migraine. May repeat in 2 hours if headache persists or recurs. 06/03/20 06/04/21 Yes [place] Donnamarie Poag, DO    Allergies    Amitriptyline, Ciprofloxacin, Semaglutide, Tramadol, and Tylenol [acetaminophen]  Review  of Systems   Review of Systems  Constitutional: Positive for fever. Negative for appetite change.  HENT: Negative for congestion.   Cardiovascular: Negative for chest pain.  Gastrointestinal: Negative for abdominal pain.  Genitourinary: Negative for flank pain.  Musculoskeletal: Negative for back pain.       Right hip and right ankle pain.  Skin: Negative for rash.  Neurological: Negative for weakness.  Psychiatric/Behavioral: Negative for confusion.    Physical Exam Updated Vital Signs BP 106/67   Pulse 70   Temp 99.6 F (37.6 C) (Oral)   Resp (!) 7   Ht 5' (1.524 m)   Wt 62 kg   SpO2 96%   BMI 26.69 kg/m   Physical Exam Vitals reviewed.  HENT:  Head: Atraumatic.  Eyes:     Pupils: Pupils are equal, round, and reactive to light.  Cardiovascular:     Rate and Rhythm: Regular rhythm.  Pulmonary:     Comments: Dialysis catheter left anterior chest wall.  No erythema. Abdominal:     Tenderness: There is abdominal tenderness.  Musculoskeletal:     Cervical back: Neck supple.     Comments: Tenderness to right hip.  Decreased range of motion.  Some tenderness to right ankle.  Skin:    General: Skin is warm.     Capillary Refill: Capillary refill takes less than 2 seconds.  Neurological:     Mental Status: She is alert and oriented to person, place, and time.     ED Results / Procedures / Treatments   Labs (all labs ordered are listed, but only abnormal results are displayed) Labs Reviewed  COMPREHENSIVE METABOLIC PANEL - Abnormal; Notable for the following components:      Result Value   Sodium 133 (*)    Glucose, Bld 186 (*)    BUN 38 (*)    Creatinine, Ser 5.38 (*)    Calcium 8.6 (*)    Albumin 3.2 (*)    Total Bilirubin 1.3 (*)    GFR, Estimated 9 (*)    All other components within normal limits  CBC WITH DIFFERENTIAL/PLATELET - Abnormal; Notable for the following components:   RDW 18.6 (*)    Platelets 139 (*)    All other components within normal  limits  RESP PANEL BY RT-PCR (FLU A&B, COVID) [id number]  CULTURE, BLOOD (ROUTINE X 2)  CULTURE, BLOOD (ROUTINE X 2)  LACTIC ACID, PLASMA  URINALYSIS, ROUTINE W REFLEX MICROSCOPIC    EKG EKG Interpretation  Date/Time:  Thursday October 02 2020 05:30:55 EDT Ventricular Rate:  87 PR Interval:  134 QRS Duration: 84 QT Interval:  390 QTC Calculation: 470 R Axis:   -12 Text Interpretation: Sinus rhythm Probable left atrial enlargement Nonspecific T abnormalities, lateral leads Confirmed by Veryl Speak [phone]) on 10/02/2020 5:48:28 AM   Radiology DG Chest 1 View  Result Date: 10/02/2020 CLINICAL DATA:  55 year old female status post fall with right femoral neck fracture. EXAM: CHEST  1 VIEW COMPARISON:  Chest radiographs 08/13/2020 and earlier. FINDINGS: Portable AP supine view at 0627 hours. Lung volumes and mediastinal contours are stable and within normal limits. Visualized tracheal air column is within normal limits. Stable left chest dual lumen dialysis type catheter. There is streaky peribronchial opacity at the left lung base. Elsewhere when Allowing for portable technique the lungs are clear. No pneumothorax or pleural effusion. Negative visible bowel gas pattern. Stable visualized osseous structures. IMPRESSION: Streaky left lower lung opacity most resembling atelectasis. No other acute cardiopulmonary abnormality. Electronically Signed   By: Genevie Ann M.D.   On: 10/02/2020 06:42   DG Ankle Complete Right  Result Date: 10/02/2020 CLINICAL DATA:  55 year old female status post fall with pain. EXAM: RIGHT ANKLE - COMPLETE 3+ VIEW COMPARISON:  None. FINDINGS: Osteopenia. There is no evidence of fracture, dislocation, or joint effusion. Degenerative spurring at the plantar calcaneus. No acute osseous abnormality identified. No discrete soft tissue injury. IMPRESSION: No acute fracture or dislocation identified about the right ankle. Electronically Signed   By: Genevie Ann M.D.   On: 10/02/2020 06:41    DG Hip Unilat W or Wo Pelvis 2-3 Views Right  Result Date: 10/02/2020 CLINICAL DATA:  55 year old female status post fall with right hip pain. EXAM: DG HIP (WITH  OR WITHOUT PELVIS) 2-3V RIGHT COMPARISON:  CT Abdomen and Pelvis 03/06/2020. FINDINGS: Femoral heads normally located. Pelvis mildly rotated to the right. Chronic pubic symphysis degeneration. Pelvis appears stable and intact. SI joints remain symmetric. Grossly intact proximal left femur. Subtle nondisplaced right femoral neck fracture (image 2). Intertrochanteric segment appears intact. Extensive calcified iliofemoral atherosclerosis. Negative lower abdominal and pelvic visceral contours. IMPRESSION: Positive for a nondisplaced fracture of the right femoral neck. Electronically Signed   By: Genevie Ann M.D.   On: 10/02/2020 06:40    Procedures Procedures   Medications Ordered in ED Medications  morphine 4 MG/ML injection 4 mg (4 mg Intravenous Given 10/02/20 0749)  ondansetron (ZOFRAN) injection 4 mg (4 mg Intravenous Given 10/02/20 0747)  morphine 4 MG/ML injection 4 mg (4 mg Intravenous Given 10/02/20 0907)  ondansetron (ZOFRAN) injection 4 mg (4 mg Intravenous Given 10/02/20 0922)  HYDROmorphone (DILAUDID) injection 0.5 mg (0.5 mg Intravenous Given 10/02/20 [id number])    ED Course  I have reviewed the triage vital signs and the nursing notes.  Pertinent labs & imaging results that were available during my care of the patient were reviewed by me and considered in my medical decision making (see chart for details).    MDM Rules/Calculators/A&P                          Patient presents after a fall.  Landed on right hip.  Complaining of right hip pain.  Subcapital fracture on x-ray.  Hilbert Odor from orthopedics notified.  However also found to have a fever.  Is a dialysis patient was dialyzed yesterday.  No clear source of the fever although does have a dialysis catheter to the left chest wall.  No erythema at the site.  Chest x-ray  reassuring.  Urine still pending as patient states she still does make some urine.  Requiring repeated dose of pain medicine.  Vomiting after pain medicine.  Will admit to unassigned medicine.  Also will discuss with nephrology. Final Clinical Impression(s) / ED Diagnoses Final diagnoses:  Fall, initial encounter  Closed fracture of neck of right femur, initial encounter ([place])  End stage renal disease on dialysis [facility])    Rx / DC Orders ED Discharge Orders    None       Davonna Belling, MD 10/02/20 1013

## 2020-10-02 NOTE — Anesthesia Procedure Notes (Signed)
Procedure Name: Intubation Date/Time: 10/02/2020 4:10 PM Performed by: Janace Litten, CRNA Pre-anesthesia Checklist: Patient identified, Emergency Drugs available, Suction available and Patient being monitored Patient Re-evaluated:Patient Re-evaluated prior to induction Oxygen Delivery Method: Circle System Utilized Preoxygenation: Pre-oxygenation with 100% oxygen Induction Type: IV induction, Rapid sequence and Cricoid Pressure applied Laryngoscope Size: Mac and 3 Grade View: Grade I Tube type: Oral Tube size: 7.0 mm Number of attempts: 1 Airway Equipment and Method: Stylet Placement Confirmation: ETT inserted through vocal cords under direct vision,  positive ETCO2 and breath sounds checked- equal and bilateral Secured at: 21 cm Tube secured with: Tape Dental Injury: Teeth and Oropharynx as per pre-operative assessment

## 2020-10-02 NOTE — ED Triage Notes (Signed)
Pt arrived via [facility] for cc of hip pain from ground level fall yesterday. Pt does not ambulate independently due to diabetic neuropathy, and fell from bed while being changed by aid. Pt reports leg bend bent under her. Did not hit head, does not take blood thinners. Hypertensive in route, PMH of HTN, dialysis, diabetes.

## 2020-10-02 NOTE — Consult Note (Addendum)
[name]  INPATIENT CONSULTATION  Reason for Consultation: ESRD on HD Requesting Provider: Dr. Alvino Chapel  HPI: [name] [name] is an 55 y.o. female with ESRD on HD MWF [facility], HTN, DM, GERD who presented after a fall and found to have hip fracture and nephrology is consulted for evaluation and management of ESRD and assoc conditions.   She reports sitting on bed trying to put pants on and falling onto R leg with immediate pain.  Presented to [facility] ED and found to have L hip fracture and she report will go to OR she thinks today.    She was febrile to 101.2 without localizing symptoms, WBC 8.5. Has L TDC - no issues recently and no pain or drainage.  Peripheral blood cultures obtained.  She completed a full dialysis yesterday no issues.  Has TDC, no perm access and cannot tell me why.  No recent issues with dialysis or infections.   COVID neg in the ED.   PMH: Past Medical History:  Diagnosis Date  . Colitis   . Diabetes mellitus   . Family history of adverse reaction to anesthesia    " my uncle's heart stoped "  . GERD (gastroesophageal reflux disease)   . Heart murmur   . Hypertension   . 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 Mitral regurgitation   . Neuropathy in diabetes ([place])   . Syncope and collapse 07/28/2015   PSH: Past Surgical History:  Procedure Laterality Date  . ABDOMINAL HYSTERECTOMY    . CATARACT EXTRACTION    . CESAREAN SECTION    . CHOLECYSTECTOMY    . EYE SURGERY Left retinal  . GANGLION CYST EXCISION    . TONSILLECTOMY    . TUBAL LIGATION      Past Medical History:  Diagnosis Date  . Colitis   . Diabetes mellitus   . Family history of adverse reaction to anesthesia    " my uncle's heart stoped "  . GERD (gastroesophageal reflux disease)   . Heart murmur   . Hypertension   . 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 Mitral regurgitation   . Neuropathy in diabetes ([name])   . Syncope and collapse 07/28/2015    Medications:  I  have reviewed the patient's current medications.  (Not in a hospital admission)   ALLERGIES:   Allergies  Allergen Reactions  . Amitriptyline Swelling    Other reaction(s): Confusion (intolerance)  . Ciprofloxacin Diarrhea and Nausea And Vomiting    VOMITING Severe vomiting requiring ED visit, IV reglan and IV fluids VOMITING   . Semaglutide Nausea Only  . Tramadol Itching  . Tylenol [Acetaminophen] Itching    FAM HX: Family History  Problem Relation Age of Onset  . Diabetes Father   . Heart attack Father   . Aneurysm Mother     Social History:   reports that she has never smoked. She has never used smokeless tobacco. She reports that she does not drink alcohol and does not use drugs.  ROS: 12 system ROS neg except per HPI  Blood pressure 106/67, pulse 70, temperature 99.6 F (37.6 C), temperature source Oral, resp. rate (!) 7, height 5' (1.524 m), weight 62 kg, SpO2 96 %. PHYSICAL EXAM: Gen: sleepy after pain medication  Eyes: anciteric ENT: EOMI Neck: supple CV:  RRR Lungs: clear ant GU: no foley Extr:  No edema Neuro: sleepy but nonfocal Skin: LIJ TDC c/d/i   Results for orders placed or performed during the  hospital encounter of 10/02/20 (from the past 48 hour(s))  Comprehensive metabolic panel     Status: Abnormal   Collection Time: 10/02/20  6:03 AM  Result Value Ref Range   Sodium 133 (L) 135 - 145 mmol/L   Potassium 5.1 3.5 - 5.1 mmol/L   Chloride 98 98 - 111 mmol/L   CO2 23 22 - 32 mmol/L   Glucose, Bld 186 (H) 70 - 99 mg/dL    Comment: Glucose reference range applies only to samples taken after fasting for at least 8 hours.   BUN 38 (H) 6 - 20 mg/dL   Creatinine, Ser 5.38 (H) 0.44 - 1.00 mg/dL   Calcium 8.6 (L) 8.9 - 10.3 mg/dL   Total Protein 6.8 6.5 - 8.1 g/dL   Albumin 3.2 (L) 3.5 - 5.0 g/dL   AST 23 15 - 41 U/L   ALT 17 0 - 44 U/L   Alkaline Phosphatase 83 38 - 126 U/L   Total Bilirubin 1.3 (H) 0.3 - 1.2 mg/dL   GFR, Estimated 9 (L) >60  mL/min    Comment: (NOTE) Calculated using the CKD-EPI Creatinine Equation (2021)    Anion gap 12 5 - 15    Comment: Performed at [facility], [place] [address], [place], [place] [address]  CBC with Differential     Status: Abnormal   Collection Time: 10/02/20  6:03 AM  Result Value Ref Range   WBC 8.5 4.0 - 10.5 K/uL   RBC 4.58 3.87 - 5.11 MIL/uL   Hemoglobin 12.7 12.0 - 15.0 g/dL   HCT 41.0 36.0 - 46.0 %   MCV 89.5 80.0 - 100.0 fL   MCH 27.7 26.0 - 34.0 pg   MCHC 31.0 30.0 - 36.0 g/dL   RDW 18.6 (H) 11.5 - 15.5 %   Platelets 139 (L) 150 - 400 K/uL   nRBC 0.0 0.0 - 0.2 %   Neutrophils Relative % 71 %   Neutro Abs 6.1 1.7 - 7.7 K/uL   Lymphocytes Relative 18 %   Lymphs Abs 1.6 0.7 - 4.0 K/uL   Monocytes Relative 8 %   Monocytes Absolute 0.7 0.1 - 1.0 K/uL   Eosinophils Relative 1 %   Eosinophils Absolute 0.1 0.0 - 0.5 K/uL   Basophils Relative 1 %   Basophils Absolute 0.1 0.0 - 0.1 K/uL   Immature Granulocytes 1 %   Abs Immature Granulocytes 0.04 0.00 - 0.07 K/uL    Comment: Performed at [facility] [address], [place],  [address]  Resp Panel by RT-PCR (Flu A&B, Covid) Nasopharyngeal Swab     Status: None   Collection Time: 10/02/20  7:55 AM   Specimen: Nasopharyngeal Swab; Nasopharyngeal(NP) swabs in vial transport medium  Result Value Ref Range   SARS Coronavirus 2 by RT PCR NEGATIVE NEGATIVE    Comment: (NOTE) SARS-CoV-2 target nucleic acids are NOT DETECTED.  The SARS-CoV-2 RNA is generally detectable in upper respiratory specimens during the acute phase of infection. The lowest concentration of SARS-CoV-2 viral copies this assay can detect is 138 copies/mL. A negative result does not preclude SARS-Cov-2 infection and should not be used as the sole basis for treatment or other patient management decisions. A negative result may occur with  improper specimen collection/handling, submission of specimen other than nasopharyngeal swab, presence of  viral mutation(s) within the areas targeted by this assay, and inadequate number of viral copies(<138 copies/mL). A negative result must be combined with clinical observations, patient history,  and epidemiological information. The expected result is Negative.  Fact Sheet for Patients:  EntrepreneurPulse.com.au  Fact Sheet for Healthcare Providers:  IncredibleEmployment.be  This test is no t yet approved or cleared by the [place] FDA and  has been authorized for detection and/or diagnosis of SARS-CoV-2 by FDA under an Emergency Use Authorization (EUA). This EUA will remain  in effect (meaning this test can be used) for the duration of the COVID-19 declaration under Section 564(b)(1) of the Act, 21 U.S.C.section 360bbb-3(b)(1), unless the authorization is terminated  or revoked sooner.       Influenza A by PCR NEGATIVE NEGATIVE   Influenza B by PCR NEGATIVE NEGATIVE    Comment: (NOTE) The Xpert Xpress SARS-CoV-2/FLU/RSV plus assay is intended as an aid in the diagnosis of influenza from Nasopharyngeal swab specimens and should not be used as a sole basis for treatment. Nasal washings and aspirates are unacceptable for Xpert Xpress SARS-CoV-2/FLU/RSV testing.  Fact Sheet for Patients: EntrepreneurPulse.com.au  Fact Sheet for Healthcare Providers: IncredibleEmployment.be  This test is not yet approved or cleared by the [place] FDA and has been authorized for detection and/or diagnosis of SARS-CoV-2 by FDA under an Emergency Use Authorization (EUA). This EUA will remain in effect (meaning this test can be used) for the duration of the COVID-19 declaration under Section 564(b)(1) of the Act, 21 U.S.C. section 360bbb-3(b)(1), unless the authorization is terminated or revoked.  Performed at [facility], [place] [address] [place], [place] [address]   Lactic acid, plasma     Status: None    Collection Time: 10/02/20  8:05 AM  Result Value Ref Range   Lactic Acid, Venous 1.1 0.5 - 1.9 mmol/L    Comment: Performed at [facility] [address], [place], [place] [address]  CBG monitoring, ED     Status: Abnormal   Collection Time: 10/02/20 11:15 AM  Result Value Ref Range   Glucose-Capillary 179 (H) 70 - 99 mg/dL    Comment: Glucose reference range applies only to samples taken after fasting for at least 8 hours.    DG Chest 1 View  Result Date: 10/02/2020 CLINICAL DATA:  55 year old female status post fall with right femoral neck fracture. EXAM: CHEST  1 VIEW COMPARISON:  Chest radiographs 08/13/2020 and earlier. FINDINGS: Portable AP supine view at 0627 hours. Lung volumes and mediastinal contours are stable and within normal limits. Visualized tracheal air column is within normal limits. Stable left chest dual lumen dialysis type catheter. There is streaky peribronchial opacity at the left lung base. Elsewhere when Allowing for portable technique the lungs are clear. No pneumothorax or pleural effusion. Negative visible bowel gas pattern. Stable visualized osseous structures. IMPRESSION: Streaky left lower lung opacity most resembling atelectasis. No other acute cardiopulmonary abnormality. Electronically Signed   By: Genevie Ann M.D.   On: 10/02/2020 06:42   DG Ankle Complete Right  Result Date: 10/02/2020 CLINICAL DATA:  55 year old female status post fall with pain. EXAM: RIGHT ANKLE - COMPLETE 3+ VIEW COMPARISON:  None. FINDINGS: Osteopenia. There is no evidence of fracture, dislocation, or joint effusion. Degenerative spurring at the plantar calcaneus. No acute osseous abnormality identified. No discrete soft tissue injury. IMPRESSION: No acute fracture or dislocation identified about the right ankle. Electronically Signed   By: Genevie Ann M.D.   On: 10/02/2020 06:41   DG Hip Unilat W or Wo Pelvis 2-3 Views Right  Result Date: 10/02/2020 CLINICAL DATA:  55 year old female  status post fall  with right hip pain. EXAM: DG HIP (WITH OR WITHOUT PELVIS) 2-3V RIGHT COMPARISON:  CT Abdomen and Pelvis 03/06/2020. FINDINGS: Femoral heads normally located. Pelvis mildly rotated to the right. Chronic pubic symphysis degeneration. Pelvis appears stable and intact. SI joints remain symmetric. Grossly intact proximal left femur. Subtle nondisplaced right femoral neck fracture (image 2). Intertrochanteric segment appears intact. Extensive calcified iliofemoral atherosclerosis. Negative lower abdominal and pelvic visceral contours. IMPRESSION: Positive for a nondisplaced fracture of the right femoral neck. Electronically Signed   By: Genevie Ann M.D.   On: 10/02/2020 06:40   HD orders:  [facility] 275mn BFR 350 DFR 600 EDW 62kg 2K/2.5Ca F180, heparin 5000 bolus and 500/hr maint  Assessment/Plan **Hip fracture: operative management planned per ortho.  Please avoid morphine in this ESRD patient.    **ESRD on HD MWF:  Plan HD tomorrow per usual schedule.  Will obtain outpt records; reports EDW 134lbs - 61kg. Labs and volume ok today.  Hold heparin.  No MIVF needed - can give small boluses PRN hypovolemia for now; doesn't appear indicated at the moment.   **BMM: not currently on a binder, check Phos; corr ca ok.   **DM: insulin per primary  **HTN: continue home meds  LJustin Mend3/31/2022, 11:44 AM

## 2020-10-02 NOTE — ED Notes (Signed)
Pt continues to complain of pain and nausea. Pt started vomiting x 2 at this time. MD notified. Waiting for new orders.

## 2020-10-02 NOTE — ED Provider Notes (Signed)
MSE was initiated and I personally evaluated the patient and placed orders (if any) at  6:08 AM on October 02, 2020.  Patient is a 55 year old female with extensive past medical history including diabetes, end-stage renal disease on hemodialysis, GERD, hypertension, diabetic neuropathy with impaired ambulation.  Patient brought by EMS for evaluation of fall.  Patient returned from dialysis yesterday.  Her aide was helping her get ready for bed when she fell.  She reports her right leg bending awkwardly beneath her.  She is complaining of severe pain in her hip and ankle.  She is unable to move or stand due to the pain.    She arrives here with stable vital signs, but is febrile with temperature of 101.2.  Patient describing no complaints that would explain her fever, but does have an indwelling dialysis catheter to the left subclavian.  Orders placed including CBC, CMP, blood cultures, lactate, chest x-ray, Covid swab, and imaging studies of the chest, right hip, and right ankle.  Orders also placed for IV access and pain medication.  The patient appears stable so that the remainder of the MSE may be completed by another provider.    Veryl Speak, MD 10/02/20 [phone]

## 2020-10-02 NOTE — ED Notes (Signed)
Blood sugar was 179.

## 2020-10-03 ENCOUNTER — Encounter (HOSPITAL_COMMUNITY): Payer: Self-pay | Admitting: Internal Medicine

## 2020-10-03 DIAGNOSIS — N186 End stage renal disease: Secondary | ICD-10-CM | POA: Diagnosis not present

## 2020-10-03 DIAGNOSIS — I120 Hypertensive chronic kidney disease with stage 5 chronic kidney disease or end stage renal disease: Secondary | ICD-10-CM

## 2020-10-03 DIAGNOSIS — E1122 Type 2 diabetes mellitus with diabetic chronic kidney disease: Secondary | ICD-10-CM

## 2020-10-03 DIAGNOSIS — M25551 Pain in right hip: Secondary | ICD-10-CM

## 2020-10-03 DIAGNOSIS — Z992 Dependence on renal dialysis: Secondary | ICD-10-CM

## 2020-10-03 DIAGNOSIS — R509 Fever, unspecified: Secondary | ICD-10-CM

## 2020-10-03 DIAGNOSIS — S72001A Fracture of unspecified part of neck of right femur, initial encounter for closed fracture: Secondary | ICD-10-CM

## 2020-10-03 DIAGNOSIS — W19XXXA Unspecified fall, initial encounter: Secondary | ICD-10-CM

## 2020-10-03 DIAGNOSIS — D631 Anemia in chronic kidney disease: Secondary | ICD-10-CM

## 2020-10-03 DIAGNOSIS — M546 Pain in thoracic spine: Secondary | ICD-10-CM

## 2020-10-03 DIAGNOSIS — Z9889 Other specified postprocedural states: Secondary | ICD-10-CM

## 2020-10-03 LAB — GLUCOSE, CAPILLARY
Glucose-Capillary: 127 mg/dL — ABNORMAL HIGH (ref 70–99)
Glucose-Capillary: 113 mg/dL — ABNORMAL HIGH (ref 70–99)
Glucose-Capillary: 117 mg/dL — ABNORMAL HIGH (ref 70–99)
Glucose-Capillary: 95 mg/dL (ref 70–99)
Glucose-Capillary: 91 mg/dL (ref 70–99)

## 2020-10-03 LAB — CBC
HCT: 33.7 % — ABNORMAL LOW (ref 36.0–46.0)
Hemoglobin: 10.6 g/dL — ABNORMAL LOW (ref 12.0–15.0)
MCH: 28.2 pg (ref 26.0–34.0)
MCHC: 31.5 g/dL (ref 30.0–36.0)
MCV: 89.6 fL (ref 80.0–100.0)
Platelets: 138 10*3/uL — ABNORMAL LOW (ref 150–400)
RBC: 3.76 MIL/uL — ABNORMAL LOW (ref 3.87–5.11)
RDW: 18.6 % — ABNORMAL HIGH (ref 11.5–15.5)
WBC: 15.3 10*3/uL — ABNORMAL HIGH (ref 4.0–10.5)
nRBC: 0 % (ref 0.0–0.2)

## 2020-10-03 LAB — BASIC METABOLIC PANEL
Anion gap: 13 (ref 5–15)
BUN: 49 mg/dL — ABNORMAL HIGH (ref 6–20)
CO2: 23 mmol/L (ref 22–32)
Calcium: 8.7 mg/dL — ABNORMAL LOW (ref 8.9–10.3)
Chloride: 100 mmol/L (ref 98–111)
Creatinine, Ser: 6.12 mg/dL — ABNORMAL HIGH (ref 0.44–1.00)
GFR, Estimated: 8 mL/min — ABNORMAL LOW (ref >60)
Glucose, Bld: 147 mg/dL — ABNORMAL HIGH (ref 70–99)
Potassium: 5.4 mmol/L — ABNORMAL HIGH (ref 3.5–5.1)
Sodium: 136 mmol/L (ref 135–145)

## 2020-10-03 LAB — PHOSPHORUS: Phosphorus: 6.5 mg/dL — ABNORMAL HIGH (ref 2.5–4.6)

## 2020-10-03 LAB — VITAMIN D 25 HYDROXY (VIT D DEFICIENCY, FRACTURES): Vit D, 25-Hydroxy: 23.13 ng/mL — ABNORMAL LOW (ref 30–100)

## 2020-10-03 MED ORDER — HYDROMORPHONE HCL 1 MG/ML IJ SOLN
0.5000 mg | INTRAMUSCULAR | Status: DC | PRN
  Administered 2020-10-03 – 2020-10-05 (×6): 1 mg via INTRAVENOUS
  Filled 2020-10-03 (×6): qty 1

## 2020-10-03 MED ORDER — HYDROMORPHONE HCL 1 MG/ML IJ SOLN
INTRAMUSCULAR | Status: AC
  Administered 2020-10-03: 0.5 mg via INTRAVENOUS
  Filled 2020-10-03: qty 0.5

## 2020-10-03 MED ORDER — GABAPENTIN 100 MG PO CAPS
100.0000 mg | ORAL_CAPSULE | Freq: Every day | ORAL | Status: DC | PRN
  Administered 2020-10-04 – 2020-10-16 (×7): 100 mg via ORAL
  Filled 2020-10-03 (×7): qty 1

## 2020-10-03 MED ORDER — LIDOCAINE 5 % EX PTCH
1.0000 | MEDICATED_PATCH | CUTANEOUS | Status: DC
  Administered 2020-10-03 – 2020-10-16 (×11): 1 via TRANSDERMAL
  Filled 2020-10-03 (×11): qty 1

## 2020-10-03 MED ORDER — GABAPENTIN 100 MG PO CAPS
200.0000 mg | ORAL_CAPSULE | Freq: Every evening | ORAL | Status: DC | PRN
  Administered 2020-10-11 – 2020-10-13 (×3): 200 mg via ORAL
  Filled 2020-10-03 (×3): qty 2

## 2020-10-03 MED ORDER — INSULIN GLARGINE 100 UNIT/ML ~~LOC~~ SOLN
8.0000 [IU] | Freq: Every day | SUBCUTANEOUS | Status: DC
  Administered 2020-10-04: 8 [IU] via SUBCUTANEOUS
  Filled 2020-10-03 (×2): qty 0.08

## 2020-10-03 MED ORDER — HEPARIN SODIUM (PORCINE) 1000 UNIT/ML IJ SOLN
INTRAMUSCULAR | Status: AC
  Administered 2020-10-03: 1000 [IU]
  Filled 2020-10-03: qty 5

## 2020-10-03 MED ORDER — ACETAMINOPHEN 325 MG PO TABS: 650.0000 mg | ORAL_TABLET | Freq: Four times a day (QID) | ORAL | Status: DC

## 2020-10-03 NOTE — Progress Notes (Signed)
HD#1 Subjective:   Yesterday, patient taken by ortho for right hip pinning. She reports nausea with emesis post-operatively yesterday, however denies nausea currently. Reports minimal pain in her R hip, however endorses L mid back pain which started post-operatively yesterday. States she is tolerating fluids however has not eaten much. Does not make urine.  Objective:   Vital signs in last 24 hours: Vitals:   10/02/20 1807 10/02/20 1822 10/02/20 1840 10/02/20 2124  BP: (!) 170/75 (!) 146/69 (!) 144/72 (!) 174/86  Pulse: 83 84 83 93  Resp: '11 10 12 10  '$ Temp:   (!) 97.4 F (36.3 C) 98.1 F (36.7 C)  TempSrc:   Oral Axillary  SpO2: 95% 97% 95% 96%  Weight:      Height:        Physical Exam Constitutional: tired-appearing woman lying in bed, in no acute distress HENT: normocephalic atraumatic, mucous membranes moist Eyes: conjunctiva non-erythematous Neck: supple Cardiovascular: regular rate and rhythm, no m/r/g, no lower extremity edema Pulmonary/Chest: normal work of breathing on room air, lungs clear to auscultation bilaterally Abdominal: soft, non-tender, non-distended, no CVA tenderness Neurological: alert & oriented x 3 MSK: mild tenderness to palpation of L-sided paraspinal muscles in the mid back Skin: warm and dry, left subclavian port in place, no erythema or drainage around area; right hip dressing c/d/i  Pertinent Labs: CBC Latest Ref Rng & Units 10/03/2020 10/02/2020 07/29/2020  WBC 4.0 - 10.5 K/uL 15.3(H) 8.5 8.9  Hemoglobin 12.0 - 15.0 g/dL 10.6(L) 12.7 14.2  Hematocrit 36.0 - 46.0 % 33.7(L) 41.0 44.8  Platelets 150 - 400 K/uL 138(L) 139(L) 179    CMP Latest Ref Rng & Units 10/03/2020 10/02/2020 07/29/2020  Glucose 70 - 99 mg/dL 147(H) 186(H) 70  BUN 6 - 20 mg/dL 49(H) 38(H) 71(H)  Creatinine 0.44 - 1.00 mg/dL 6.12(H) 5.38(H) 10.77(H)  Sodium 135 - 145 mmol/L 136 133(L) 133(L)  Potassium 3.5 - 5.1 mmol/L 5.4(H) 5.1 5.4(H)  Chloride 98 - 111 mmol/L 100 98 94(L)   CO2 22 - 32 mmol/L 23 23 18(L)  Calcium 8.9 - 10.3 mg/dL 8.7(L) 8.6(L) 7.8(L)  Total Protein 6.5 - 8.1 g/dL - 6.8 -  Total Bilirubin 0.3 - 1.2 mg/dL - 1.3(H) -  Alkaline Phos 38 - 126 U/L - 83 -  AST 15 - 41 U/L - 23 -  ALT 0 - 44 U/L - 17 -    Imaging:  DG HIP UNILAT WITH PELVIS 2-3 VIEWS RIGHT  Result Date: 10/02/2020 CLINICAL DATA:  Postoperative images. EXAM: DG HIP (WITH OR WITHOUT PELVIS) 2-3V RIGHT COMPARISON:  October 02, 2020 FINDINGS: Post right hip pinning with 3 cancellous screws across previously demonstrated femoral neck fracture. Anatomic alignment. Vascular calcifications are noted. IMPRESSION: Post right hip pinning with anatomic alignment. Electronically Signed   By: Fidela Salisbury M.D.   On: 10/02/2020 18:50   DG Hip Unilat W or Wo Pelvis 2-3 Views Right  Result Date: 10/02/2020 CLINICAL DATA:  55 year old female status post fall with right hip pain. EXAM: DG HIP (WITH OR WITHOUT PELVIS) 2-3V RIGHT COMPARISON:  CT Abdomen and Pelvis 03/06/2020. FINDINGS: Femoral heads normally located. Pelvis mildly rotated to the right. Chronic pubic symphysis degeneration. Pelvis appears stable and intact. SI joints remain symmetric. Grossly intact proximal left femur. Subtle nondisplaced right femoral neck fracture (image 2). Intertrochanteric segment appears intact. Extensive calcified iliofemoral atherosclerosis. Negative lower abdominal and pelvic visceral contours. IMPRESSION: Positive for a nondisplaced fracture of the right femoral neck. Electronically  Signed   By: Genevie Ann M.D.   On: 10/02/2020 06:40    Assessment/Plan:   Active Problems:   Fracture of femoral neck, right [facility])   Patient Summary:  [name] [name] is a 55 y.o. woman with history of ESRD on HD MWF, diabetes type 2, hypertension, NAFLD, GERD, history of recurrent UTIs, and chronic bilateral lower extremity weakness who presented after a fall and found to have a R femoral neck fracture. She is now POD1 s/p  percutaneous pinning.   Right femoral neck fracture POD1 s/p percutaneous pinning Mechanical fall Patient reportedly tolerated procedure well. Hip pain well-controlled, though endorses L-sided mid back pain which is tender to palpation on my exam. Suspect muscle strain from positioning during surgery. Will trial lidocaine patch. Patient with post-operative emesis and nausea which is improved this morning, though appetite has been poor.  - ortho following, appreciate recs  - partial weight bearing R leg (50%)  - unrestricted ROM R hip and knee  - PT/OT  - Ice PRN  - Dressing changes as needed starting 4/2; d/c sutures ~4/14  - DVT/PE ppx: heparin while inpt, if goes to SNF rec SQ heparin x 21 days - Pain management: Dilaudid 0.5-1 mg q2h PRN, oxy 5 q4h PRN, patient has Tylenol allergy, lidocaine patch for back pain  Fever of unknown origin Fever curve downtrended overnight, no fevers since admission. She is saturating well on room air, weaned from 2L initially placed on admission. WBC increased to 15.3 from 8.5, however could be stress response from surgery. Work-up thus far has been unrevealing. Unable to obtain urinalysis as patient does not produce urine. Blood cultures negative so far. Will continue monitoring. - AM CBC - Blood cultures NG @ 24 h - Monitor fever curve - Treat pain as above  ESRD on HD Plan for HD today. Electrolytes are relatively stable. No apparent volume overload on exam. - Nephrology following - Avoid morphine - AM renal function panel  Type 2 diabetes mellitus A1c 6.5%. Patient on Lantus 13 u daily and insulin aspart 2-14 units with meals at home. Fasting glucose 147 this morning. Patient's appetite has been poor so far. Will restart Lantus at reduced dose.  - Lantus 8 u QHS - SSI sensitive - Frequent CBGs - Resume home gabapentin, 200 mg QHS PRN, 100 mg daily PRN  HTN Patient is on amlodipine, carvedilol, hydralazine, isosorbil at home.  Blood pressure on  arrival was elevated around 192/93, however patient  Was in significant distress and pain from her left hip fracture. Overnight and this morning, BP have ranged 130s-150s/60s-70s.  - Resume home amlodipine 10 mg  - Continue to hold home carvedilol 25 mg BID, hydralazine 75 mg q8h, and isosordil 20 mg TID; resume as tolerated  Diet: Renal with 1200 mL fluid restriction IVF: None,None VTE: Heparin Code: Full PT/OT recs: Pending   Dispo: Anticipated discharge to TBD (pending PT/OT evaluation).    Please contact the on call pager after 5 pm and on weekends at [phone].  Alexandria Lodge, MD PGY-1 Internal Medicine Teaching Service Pager: [phone] 10/03/2020

## 2020-10-03 NOTE — Progress Notes (Signed)
OT Cancellation Note  Patient Details Name: [name] [name] MRN: [id number] DOB: 17-Mar-1966   Cancelled Treatment:    Reason Eval/Treat Not Completed: Patient at procedure or test/ unavailable. Pt at HD, OT will follow up next available time  Britt Bottom 10/03/2020, 9:10 AM

## 2020-10-03 NOTE — Progress Notes (Signed)
[facility] Progress Note   Subjective:   Expected post op pain.  S/p HD this AM with 0.5L UF. No other issues.  Objective Vitals:   10/03/20 1100 10/03/20 1130 10/03/20 1235 10/03/20 1305  BP: (!) 155/74 130/68  (!) 156/74  Pulse: 79 79  90  Resp:   18 18  Temp:   99 F (37.2 C) 98.8 F (37.1 C)  TempSrc:   Oral Oral  SpO2:    92%  Weight:   60.5 kg   Height:       Physical Exam Gen: awake and calm Eyes: anciteric ENT: EOMI Neck: supple CV:  RRR Lungs: clear ant Extr:  No edema Neuro:  nonfocal Skin: LIJ TDC c/d/i  Additional Objective Labs: Basic Metabolic Panel: Recent Labs  Lab 10/02/20 0603 10/03/20 0327  NA 133* 136  K 5.1 5.4*  CL 98 100  CO2 23 23  GLUCOSE 186* 147*  BUN 38* 49*  CREATININE 5.38* 6.12*  CALCIUM 8.6* 8.7*  PHOS  --  6.5*   Liver Function Tests: Recent Labs  Lab 10/02/20 0603  AST 23  ALT 17  ALKPHOS 83  BILITOT 1.3*  PROT 6.8  ALBUMIN 3.2*   No results for input(s): LIPASE, AMYLASE in the last 168 hours. CBC: Recent Labs  Lab 10/02/20 0603 10/03/20 0327  WBC 8.5 15.3*  NEUTROABS 6.1  --   HGB 12.7 10.6*  HCT 41.0 33.7*  MCV 89.5 89.6  PLT 139* 138*   Blood Culture    Component Value Date/Time   SDES BLOOD REJ 03/07/2020 1128   SPECREQUEST  03/07/2020 1128    BOTTLES DRAWN AEROBIC AND ANAEROBIC Blood Culture adequate volume   CULT  03/07/2020 1128    NO GROWTH 5 DAYS Performed at [facility], [place] [address], [place], [place] [address]    REPTSTATUS 03/12/2020 FINAL 03/07/2020 1128    Cardiac Enzymes: No results for input(s): CKTOTAL, CKMB, CKMBINDEX, TROPONINI in the last 168 hours. CBG: Recent Labs  Lab 10/02/20 1436 10/02/20 1738 10/02/20 2106 10/03/20 0800 10/03/20 1302  GLUCAP 174* 175* 190* 127* 113*   Iron Studies: No results for input(s): IRON, TIBC, TRANSFERRIN, FERRITIN in the last 72 hours. '@lablastinr3'$ @ Studies/Results: DG Chest 1 View  Result Date:  10/02/2020 CLINICAL DATA:  55 year old female status post fall with right femoral neck fracture. EXAM: CHEST  1 VIEW COMPARISON:  Chest radiographs 08/13/2020 and earlier. FINDINGS: Portable AP supine view at 0627 hours. Lung volumes and mediastinal contours are stable and within normal limits. Visualized tracheal air column is within normal limits. Stable left chest dual lumen dialysis type catheter. There is streaky peribronchial opacity at the left lung base. Elsewhere when Allowing for portable technique the lungs are clear. No pneumothorax or pleural effusion. Negative visible bowel gas pattern. Stable visualized osseous structures. IMPRESSION: Streaky left lower lung opacity most resembling atelectasis. No other acute cardiopulmonary abnormality. Electronically Signed   By: Genevie Ann M.D.   On: 10/02/2020 06:42   DG Ankle Complete Right  Result Date: 10/02/2020 CLINICAL DATA:  55 year old female status post fall with pain. EXAM: RIGHT ANKLE - COMPLETE 3+ VIEW COMPARISON:  None. FINDINGS: Osteopenia. There is no evidence of fracture, dislocation, or joint effusion. Degenerative spurring at the plantar calcaneus. No acute osseous abnormality identified. No discrete soft tissue injury. IMPRESSION: No acute fracture or dislocation identified about the right ankle. Electronically Signed   By: Genevie Ann M.D.   On: 10/02/2020 06:41  DG C-Arm 1-60 Min  Result Date: 10/02/2020 CLINICAL DATA:  Cannulated hip pinning. EXAM: DG C-ARM 1-60 MIN; OPERATIVE RIGHT HIP WITH PELVIS FLUOROSCOPY TIME:  Fluoroscopy Time:  47 seconds. Radiation Exposure Index (if provided by the fluoroscopic device): 7.4 mGy. Number of Acquired Spot Images: 7 COMPARISON:  October 02, 2020. FINDINGS: Seven C-arm fluoroscopic images were obtained intraoperatively and submitted for post operative interpretation. These images demonstrate right hip pinning of a femoral neck fracture with 3 cannulated screws. Near anatomic alignment. Please see the  performing provider's procedural report for further detail. IMPRESSION: Intraoperative fluoroscopy, as detailed above. Electronically Signed   By: Margaretha Sheffield MD   On: 10/02/2020 17:50   DG HIP OPERATIVE UNILAT WITH PELVIS RIGHT  Result Date: 10/02/2020 CLINICAL DATA:  Cannulated hip pinning. EXAM: DG C-ARM 1-60 MIN; OPERATIVE RIGHT HIP WITH PELVIS FLUOROSCOPY TIME:  Fluoroscopy Time:  47 seconds. Radiation Exposure Index (if provided by the fluoroscopic device): 7.4 mGy. Number of Acquired Spot Images: 7 COMPARISON:  October 02, 2020. FINDINGS: Seven C-arm fluoroscopic images were obtained intraoperatively and submitted for post operative interpretation. These images demonstrate right hip pinning of a femoral neck fracture with 3 cannulated screws. Near anatomic alignment. Please see the performing provider's procedural report for further detail. IMPRESSION: Intraoperative fluoroscopy, as detailed above. Electronically Signed   By: Margaretha Sheffield MD   On: 10/02/2020 17:50   DG HIP UNILAT WITH PELVIS 2-3 VIEWS RIGHT  Result Date: 10/02/2020 CLINICAL DATA:  Postoperative images. EXAM: DG HIP (WITH OR WITHOUT PELVIS) 2-3V RIGHT COMPARISON:  October 02, 2020 FINDINGS: Post right hip pinning with 3 cancellous screws across previously demonstrated femoral neck fracture. Anatomic alignment. Vascular calcifications are noted. IMPRESSION: Post right hip pinning with anatomic alignment. Electronically Signed   By: Fidela Salisbury M.D.   On: 10/02/2020 18:50   DG Hip Unilat W or Wo Pelvis 2-3 Views Right  Result Date: 10/02/2020 CLINICAL DATA:  55 year old female status post fall with right hip pain. EXAM: DG HIP (WITH OR WITHOUT PELVIS) 2-3V RIGHT COMPARISON:  CT Abdomen and Pelvis 03/06/2020. FINDINGS: Femoral heads normally located. Pelvis mildly rotated to the right. Chronic pubic symphysis degeneration. Pelvis appears stable and intact. SI joints remain symmetric. Grossly intact proximal left femur.  Subtle nondisplaced right femoral neck fracture (image 2). Intertrochanteric segment appears intact. Extensive calcified iliofemoral atherosclerosis. Negative lower abdominal and pelvic visceral contours. IMPRESSION: Positive for a nondisplaced fracture of the right femoral neck. Electronically Signed   By: Genevie Ann M.D.   On: 10/02/2020 06:40   Medications: .  ceFAZolin (ANCEF) IV     . Chlorhexidine Gluconate Cloth  6 each Topical Q0600  . docusate sodium  100 mg Oral BID  . heparin injection (subcutaneous)  5,000 Units Subcutaneous Q8H  . insulin aspart  0-9 Units Subcutaneous TID WC  . insulin glargine  8 Units Subcutaneous QHS  . lidocaine  1 patch Transdermal Q24H   HD orders:  Triad 243mn BFR 350 DFR 600 EDW 62kg 2K/2.5Ca F180, heparin 5000 bolus and 500/hr maint  Assessment/Plan **Hip fracture: s/p operative management per ortho.  Please avoid morphine in this ESRD patient.    **ESRD on HD MWF:  a/p HD today per usual schedule.  Will plan next HD Monday per usual schedule.    **BMM: not currently on a binder, Phos - 6.5 but had missed an HD = follow; corr ca ok.   **DM: insulin per primary  **HTN: continue home meds; per outpt HD  records frequent HTN  **Anemia:  Hb 10.6. post op.  Trend.   Jannifer Hick MD 10/03/2020, 2:03 PM  [facility] Pager: [phone]

## 2020-10-03 NOTE — Progress Notes (Signed)
Orthopaedic Trauma Service Progress Note  Patient ID: [name] [name] MRN: [id number] DOB/AGE: 1965-08-14 55 y.o.  Subjective:  dialysis   ROS As above  Objective:   VITALS:   Vitals:   10/03/20 1000 10/03/20 1030 10/03/20 1045 10/03/20 1100  BP: 131/74 132/76 133/71 (!) 155/74  Pulse:  85 84 79  Resp:      Temp:      TempSrc:      SpO2:      Weight:      Height:        Estimated body mass index is 26.52 kg/m as calculated from the following:   Height as of this encounter: 5' (1.524 m).   Weight as of this encounter: 61.6 kg.   Intake/Output      03/31 0701 04/01 0700 04/01 0701 04/02 0700   I.V. (mL/kg) 250 (4)    IV Piggyback 350    Total Intake(mL/kg) 600 (9.7)    Net +600           LABS  Results for orders placed or performed during the hospital encounter of 10/02/20 (from the past 24 hour(s))  CBG monitoring, ED     Status: Abnormal   Collection Time: 10/02/20  1:33 PM  Result Value Ref Range   Glucose-Capillary 160 (H) 70 - 99 mg/dL  Glucose, capillary     Status: Abnormal   Collection Time: 10/02/20  2:36 PM  Result Value Ref Range   Glucose-Capillary 174 (H) 70 - 99 mg/dL  Surgical pcr screen     Status: None   Collection Time: 10/02/20  3:09 PM   Specimen: Nasal Mucosa; Nasal Swab  Result Value Ref Range   MRSA, PCR NEGATIVE NEGATIVE   Staphylococcus aureus NEGATIVE NEGATIVE  Glucose, capillary     Status: Abnormal   Collection Time: 10/02/20  5:38 PM  Result Value Ref Range   Glucose-Capillary 175 (H) 70 - 99 mg/dL  TSH     Status: Abnormal   Collection Time: 10/02/20  6:55 PM  Result Value Ref Range   TSH 5.102 (H) 0.350 - 4.500 uIU/mL  Hepatitis B surface antigen     Status: None   Collection Time: 10/02/20  6:55 PM  Result Value Ref Range   Hepatitis B Surface Ag NON REACTIVE NON REACTIVE  Glucose, capillary     Status: Abnormal   Collection Time: 10/02/20   9:06 PM  Result Value Ref Range   Glucose-Capillary 190 (H) 70 - 99 mg/dL  Basic metabolic panel     Status: Abnormal   Collection Time: 10/03/20  3:27 AM  Result Value Ref Range   Sodium 136 135 - 145 mmol/L   Potassium 5.4 (H) 3.5 - 5.1 mmol/L   Chloride 100 98 - 111 mmol/L   CO2 23 22 - 32 mmol/L   Glucose, Bld 147 (H) 70 - 99 mg/dL   BUN 49 (H) 6 - 20 mg/dL   Creatinine, Ser 6.12 (H) 0.44 - 1.00 mg/dL   Calcium 8.7 (L) 8.9 - 10.3 mg/dL   GFR, Estimated 8 (L) >60 mL/min   Anion gap 13 5 - 15  CBC     Status: Abnormal   Collection Time: 10/03/20  3:27 AM  Result Value Ref Range   WBC 15.3 (H) 4.0 - 10.5 K/uL  RBC 3.76 (L) 3.87 - 5.11 MIL/uL   Hemoglobin 10.6 (L) 12.0 - 15.0 g/dL   HCT 33.7 (L) 36.0 - 46.0 %   MCV 89.6 80.0 - 100.0 fL   MCH 28.2 26.0 - 34.0 pg   MCHC 31.5 30.0 - 36.0 g/dL   RDW 18.6 (H) 11.5 - 15.5 %   Platelets 138 (L) 150 - 400 K/uL   nRBC 0.0 0.0 - 0.2 %  Phosphorus     Status: Abnormal   Collection Time: 10/03/20  3:27 AM  Result Value Ref Range   Phosphorus 6.5 (H) 2.5 - 4.6 mg/dL  Glucose, capillary     Status: Abnormal   Collection Time: 10/03/20  8:00 AM  Result Value Ref Range   Glucose-Capillary 127 (H) 70 - 99 mg/dL     PHYSICAL EXAM:   Gen: NAD  Ext:       Right Lower Extremity   Dressing stable  No acute changes in exam     Assessment/Plan: 1 Day Post-Op   Active Problems:   Fracture of femoral neck, right (HCC)   Anti-infectives (From admission, onward)   Start     Dose/Rate Route Frequency Ordered Stop   10/03/20 1700  ceFAZolin (ANCEF) IVPB 1 g/50 mL premix        1 g 100 mL/hr over 30 Minutes Intravenous Daily 10/02/20 1841 10/04/20 0959   10/03/20 0600  ceFAZolin (ANCEF) IVPB 2g/100 mL premix        2 g 200 mL/hr over 30 Minutes Intravenous On call to O.R. 10/02/20 1507 10/02/20 1639   10/02/20 1510  ceFAZolin (ANCEF) 2-4 GM/100ML-% IVPB       Note to Pharmacy: Rocky Morel   : cabinet override      10/02/20  1510 10/02/20 1642    .  POD/HD#: 1  55 y/o female with numerous medical comorbidities with nondisplaced R femoral neck fracture s/p percutaneous pinning   -R femoral neck fracture s/p percutaneous pinning   PWB R leg (50%)  Unrestricted ROM R hip and knee  PT/OT  Ice PRN   Dressing changes as needed starting 10/04/2020    Dc sutures R hip around 10/16/2020  - Pain management:  Multimodal  Minimize narcotics   - DVT/PE prophylaxis:  Heparin while inpt  Given type of fracture and surgery, as well as ESRD do not think she needs formal anticoagulation post discharge   If she goes to SNF would continue sq heparin x 21 days  - ID:   periop abx   Renally dosed   - Metabolic Bone Disease:  Per renal   - Impediments to fracture healing:  ESRD, metabolic bone disease, DM  - Dispo:  Therapy evals  Ortho issues stable    Jari Pigg, PA-C [phone] (C) 10/03/2020, 11:20 AM  [facility] [place] [address] [phone] Jenetta [phone] (F)    After 5pm and on the weekends please log on to Amion, go to orthopaedics and the look under the [facility] Call for the provider(s) on call. You can also call our office at [phone] and then follow the prompts to be connected to the call team.

## 2020-10-03 NOTE — Anesthesia Postprocedure Evaluation (Signed)
Anesthesia Post Note  Patient: [name] [name]  Procedure(s) Performed: CANNULATED HIP PINNING (Right Hip)     Patient location during evaluation: PACU Anesthesia Type: General Level of consciousness: awake and alert Pain management: pain level controlled Vital Signs Assessment: post-procedure vital signs reviewed and stable Respiratory status: spontaneous breathing, nonlabored ventilation, respiratory function stable and patient connected to nasal cannula oxygen Cardiovascular status: blood pressure returned to baseline and stable Postop Assessment: no apparent nausea or vomiting Anesthetic complications: no   No complications documented.  Last Vitals:  Vitals:   10/03/20 0930 10/03/20 1000  BP: 140/71 131/74  Pulse: 82   Resp:    Temp:    SpO2:      Last Pain:  Vitals:   10/03/20 0833  TempSrc: Oral  PainSc: 0-No pain   Pain Goal: Patients Stated Pain Goal: 3 (10/02/20 2318)                 Audry Pili

## 2020-10-03 NOTE — Progress Notes (Signed)
Report received from Dialysis nurse. Pt back on unit. Alert and oriented x4. Vital signs are stable. Pt denies pain in right hip. No other complaints. States she still doesn't feel like eating, but needs to eat something. She would not drink a carb steady shake this morning and denied again when arriving back to unit.

## 2020-10-03 NOTE — Progress Notes (Signed)
PT Cancellation Note  Patient Details Name: SHAWNEEQUA VICTORIA MRN: [id number] DOB: 07-31-1965   Cancelled Treatment:    Reason Eval/Treat Not Completed: Other (comment). Pt is too fatigued from HD for many hours today, very long session.  Asked to wait until AM to move.  Will re-attempt in the AM.   Ramond Dial 10/03/2020, 3:45 PM   Mee Hives, PT MS Acute Rehab Dept. Number: [place] and [place]

## 2020-10-03 NOTE — Progress Notes (Signed)
PT Cancellation Note  Patient Details Name: [name] [name] MRN: [id number] DOB: 10-22-1965   Cancelled Treatment:    Reason Eval/Treat Not Completed: Patient at procedure or test/unavailable.  Pt is in HD, will retry as time and pt allow.   Ramond Dial 10/03/2020, 10:40 AM   Mee Hives, PT [facility] Acute Rehab Dept. Number: [place] and [place]

## 2020-10-04 DIAGNOSIS — R531 Weakness: Secondary | ICD-10-CM

## 2020-10-04 DIAGNOSIS — N186 End stage renal disease: Secondary | ICD-10-CM | POA: Diagnosis not present

## 2020-10-04 DIAGNOSIS — E1122 Type 2 diabetes mellitus with diabetic chronic kidney disease: Secondary | ICD-10-CM | POA: Diagnosis not present

## 2020-10-04 DIAGNOSIS — I120 Hypertensive chronic kidney disease with stage 5 chronic kidney disease or end stage renal disease: Secondary | ICD-10-CM | POA: Diagnosis not present

## 2020-10-04 DIAGNOSIS — D6489 Other specified anemias: Secondary | ICD-10-CM

## 2020-10-04 DIAGNOSIS — R509 Fever, unspecified: Secondary | ICD-10-CM | POA: Diagnosis not present

## 2020-10-04 LAB — BASIC METABOLIC PANEL WITH GFR
Anion gap: 11 (ref 5–15)
BUN: 22 mg/dL — ABNORMAL HIGH (ref 6–20)
CO2: 24 mmol/L (ref 22–32)
Calcium: 8.5 mg/dL — ABNORMAL LOW (ref 8.9–10.3)
Chloride: 99 mmol/L (ref 98–111)
Creatinine, Ser: 3.84 mg/dL — ABNORMAL HIGH (ref 0.44–1.00)
GFR, Estimated: 13 mL/min — ABNORMAL LOW
Glucose, Bld: 81 mg/dL (ref 70–99)
Potassium: 5 mmol/L (ref 3.5–5.1)
Sodium: 134 mmol/L — ABNORMAL LOW (ref 135–145)

## 2020-10-04 LAB — CBC
HCT: 30.6 % — ABNORMAL LOW (ref 36.0–46.0)
Hemoglobin: 9.8 g/dL — ABNORMAL LOW (ref 12.0–15.0)
MCH: 28.4 pg (ref 26.0–34.0)
MCHC: 32 g/dL (ref 30.0–36.0)
MCV: 88.7 fL (ref 80.0–100.0)
Platelets: 134 K/uL — ABNORMAL LOW (ref 150–400)
RBC: 3.45 MIL/uL — ABNORMAL LOW (ref 3.87–5.11)
RDW: 18.6 % — ABNORMAL HIGH (ref 11.5–15.5)
WBC: 10.1 K/uL (ref 4.0–10.5)
nRBC: 0 % (ref 0.0–0.2)

## 2020-10-04 LAB — GLUCOSE, CAPILLARY
Glucose-Capillary: 93 mg/dL (ref 70–99)
Glucose-Capillary: 123 mg/dL — ABNORMAL HIGH (ref 70–99)
Glucose-Capillary: 93 mg/dL (ref 70–99)
Glucose-Capillary: 91 mg/dL (ref 70–99)

## 2020-10-04 MED ORDER — AMLODIPINE BESYLATE 10 MG PO TABS
10.0000 mg | ORAL_TABLET | Freq: Every day | ORAL | Status: DC
  Administered 2020-10-04 – 2020-10-12 (×7): 10 mg via ORAL
  Filled 2020-10-04 (×8): qty 1

## 2020-10-04 MED ORDER — CARVEDILOL 25 MG PO TABS
25.0000 mg | ORAL_TABLET | Freq: Two times a day (BID) | ORAL | Status: DC
  Administered 2020-10-04 – 2020-10-12 (×17): 25 mg via ORAL
  Filled 2020-10-04 (×12): qty 1
  Filled 2020-10-04: qty 2
  Filled 2020-10-04 (×5): qty 1

## 2020-10-04 MED ORDER — OXYCODONE HCL 5 MG PO TABS
10.0000 mg | ORAL_TABLET | ORAL | Status: DC | PRN
  Administered 2020-10-04 – 2020-10-05 (×5): 10 mg via ORAL
  Filled 2020-10-04 (×6): qty 2

## 2020-10-04 MED ORDER — CALCIUM CARBONATE ANTACID 500 MG PO CHEW
2.0000 | CHEWABLE_TABLET | Freq: Three times a day (TID) | ORAL | Status: DC
  Administered 2020-10-04 – 2020-10-15 (×16): 400 mg via ORAL
  Filled 2020-10-04 (×22): qty 2

## 2020-10-04 MED ORDER — HYDRALAZINE HCL 50 MG PO TABS
50.0000 mg | ORAL_TABLET | Freq: Three times a day (TID) | ORAL | Status: DC
  Administered 2020-10-04 – 2020-10-12 (×23): 50 mg via ORAL
  Filled 2020-10-04 (×25): qty 1

## 2020-10-04 MED ORDER — ISOSORBIDE DINITRATE 20 MG PO TABS
20.0000 mg | ORAL_TABLET | Freq: Three times a day (TID) | ORAL | Status: DC
  Administered 2020-10-04 – 2020-10-12 (×22): 20 mg via ORAL
  Filled 2020-10-04 (×30): qty 1

## 2020-10-04 NOTE — Progress Notes (Signed)
PT Cancellation Note  Patient Details Name: [name] [name] MRN: [id number] DOB: [birth year]   Cancelled Treatment:    Reason Eval/Treat Not Completed: Other (comment);Fatigue/lethargy limiting ability to participate.  Pt had elevation of BP this AM with OT, now too tired and feeling bad.  Agreed she wanted to get up and will retry in the AM.   Ramond Dial 10/04/2020, 3:28 PM   Mee Hives, PT [facility] Acute Rehab Dept. Number: [place] and [place]

## 2020-10-04 NOTE — Progress Notes (Addendum)
[facility] Progress Note   Subjective:   Expected post op pain.   No other issues.  Objective Vitals:   10/03/20 1305 10/03/20 2100 10/04/20 0419 10/04/20 0801  BP: (!) 156/74 (!) 145/75 (!) 160/68 (!) 184/76  Pulse: 90 88 86 85  Resp: '18 18 16 17  '$ Temp: 98.8 F (37.1 C) 98 F (36.7 C) 98.7 F (37.1 C) 99 F (37.2 C)  TempSrc: Oral Oral Oral Oral  SpO2: 92% 95% 97% 92%  Weight:      Height:       Physical Exam Gen: awake and calm Eyes: anciteric ENT: EOMI Neck: supple CV:  RRR Lungs: clear ant Extr:  No edema Neuro:  nonfocal Skin: LIJ TDC c/d/i  Additional Objective Labs: Basic Metabolic Panel: Recent Labs  Lab 10/02/20 0603 10/03/20 0327 10/04/20 0344  NA 133* 136 134*  K 5.1 5.4* 5.0  CL 98 100 99  CO2 '23 23 24  '$ GLUCOSE 186* 147* 81  BUN 38* 49* 22*  CREATININE 5.38* 6.12* 3.84*  CALCIUM 8.6* 8.7* 8.5*  PHOS  --  6.5*  --    Liver Function Tests: Recent Labs  Lab 10/02/20 0603  AST 23  ALT 17  ALKPHOS 83  BILITOT 1.3*  PROT 6.8  ALBUMIN 3.2*   No results for input(s): LIPASE, AMYLASE in the last 168 hours. CBC: Recent Labs  Lab 10/02/20 0603 10/03/20 0327 10/04/20 0344  WBC 8.5 15.3* 10.1  NEUTROABS 6.1  --   --   HGB 12.7 10.6* 9.8*  HCT 41.0 33.7* 30.6*  MCV 89.5 89.6 88.7  PLT 139* 138* 134*   Blood Culture    Component Value Date/Time   SDES BLOOD RIGHT HAND 10/02/2020 0755   SPECREQUEST  10/02/2020 0755    BOTTLES DRAWN AEROBIC AND ANAEROBIC Blood Culture results may not be optimal due to an inadequate volume of blood received in culture bottles   CULT  10/02/2020 0755    NO GROWTH 1 DAY Performed at [facility] [address] [place], [place] [address]    REPTSTATUS PENDING 10/02/2020 [id number]    Cardiac Enzymes: No results for input(s): CKTOTAL, CKMB, CKMBINDEX, TROPONINI in the last 168 hours. CBG: Recent Labs  Lab 10/03/20 1302 10/03/20 1607 10/03/20 2101 10/03/20 2134 10/04/20 0649   GLUCAP 113* 117* 95 91 93   Iron Studies: No results for input(s): IRON, TIBC, TRANSFERRIN, FERRITIN in the last 72 hours. '@lablastinr3'$ @ Studies/Results: DG C-Arm 1-60 Min  Result Date: 10/02/2020 CLINICAL DATA:  Cannulated hip pinning. EXAM: DG C-ARM 1-60 MIN; OPERATIVE RIGHT HIP WITH PELVIS FLUOROSCOPY TIME:  Fluoroscopy Time:  47 seconds. Radiation Exposure Index (if provided by the fluoroscopic device): 7.4 mGy. Number of Acquired Spot Images: 7 COMPARISON:  October 02, 2020. FINDINGS: Seven C-arm fluoroscopic images were obtained intraoperatively and submitted for post operative interpretation. These images demonstrate right hip pinning of a femoral neck fracture with 3 cannulated screws. Near anatomic alignment. Please see the performing provider's procedural report for further detail. IMPRESSION: Intraoperative fluoroscopy, as detailed above. Electronically Signed   By: Margaretha Sheffield MD   On: 10/02/2020 17:50   DG HIP OPERATIVE UNILAT WITH PELVIS RIGHT  Result Date: 10/02/2020 CLINICAL DATA:  Cannulated hip pinning. EXAM: DG C-ARM 1-60 MIN; OPERATIVE RIGHT HIP WITH PELVIS FLUOROSCOPY TIME:  Fluoroscopy Time:  47 seconds. Radiation Exposure Index (if provided by the fluoroscopic device): 7.4 mGy. Number of Acquired Spot Images: 7 COMPARISON:  October 02, 2020. FINDINGS: Seven  C-arm fluoroscopic images were obtained intraoperatively and submitted for post operative interpretation. These images demonstrate right hip pinning of a femoral neck fracture with 3 cannulated screws. Near anatomic alignment. Please see the performing provider's procedural report for further detail. IMPRESSION: Intraoperative fluoroscopy, as detailed above. Electronically Signed   By: Margaretha Sheffield MD   On: 10/02/2020 17:50   DG HIP UNILAT WITH PELVIS 2-3 VIEWS RIGHT  Result Date: 10/02/2020 CLINICAL DATA:  Postoperative images. EXAM: DG HIP (WITH OR WITHOUT PELVIS) 2-3V RIGHT COMPARISON:  October 02, 2020 FINDINGS:  Post right hip pinning with 3 cancellous screws across previously demonstrated femoral neck fracture. Anatomic alignment. Vascular calcifications are noted. IMPRESSION: Post right hip pinning with anatomic alignment. Electronically Signed   By: Fidela Salisbury M.D.   On: 10/02/2020 18:50   Medications:  . Chlorhexidine Gluconate Cloth  6 each Topical Q0600  . docusate sodium  100 mg Oral BID  . heparin injection (subcutaneous)  5,000 Units Subcutaneous Q8H  . insulin aspart  0-9 Units Subcutaneous TID WC  . insulin glargine  8 Units Subcutaneous QHS  . lidocaine  1 patch Transdermal Q24H   HD orders:  Triad 284mn BFR 350 DFR 600 EDW 62kg 2K/2.5Ca F180, heparin 5000 bolus and 500/hr maint  Assessment/Plan **Hip fracture: s/p operative management per ortho.  Please avoid morphine in this ESRD patient.    **ESRD on HD MWF:  s/p HD Friday per.  Will plan next HD Monday per usual schedule.  Labs/vol fine today.  **BMM: clarified - she takes ca carbonate 2 TIDAC as binder, Phos - 6.5, resume home binder; corr ca ok.   **DM: insulin per primary  **HTN: continue home meds; per outpt HD records frequent HTN.   **Anemia:  Hb 10.6 > 9.8. post op.  Trend.   LJannifer HickMD 10/04/2020, 8:27 AM  [facility] Pager: [phone]

## 2020-10-04 NOTE — Progress Notes (Signed)
   10/04/20 1300  Assess: MEWS Score  BP (!) 186/81  Pulse Rate (!) 112  Assess: MEWS Score  MEWS Temp 0  MEWS Systolic 0  MEWS Pulse 2  MEWS RR 0  MEWS LOC 0  MEWS Score 2  MEWS Score Color Yellow  Assess: if the MEWS score is Yellow or Red  Were vital signs taken at a resting state? Yes  Focused Assessment No change from prior assessment  Early Detection of Sepsis Score *See Row Information* Medium  MEWS guidelines implemented *See Row Information* Yes  Treat  MEWS Interventions Administered prn meds/treatments  Take Vital Signs  Increase Vital Sign Frequency  Yellow: Q 2hr X 2 then Q 4hr X 2, if remains yellow, continue Q 4hrs  Escalate  MEWS: Escalate Yellow: discuss with charge nurse/RN and consider discussing with provider and RRT  Notify: Charge Nurse/RN  Name of Charge Nurse/RN Notified Rodena Piety RN  Date Charge Nurse/RN Notified 10/04/20  Time Charge Nurse/RN Notified 1309  Notify: Provider  Provider Name/Title Jannifer Hick MD  Date Provider Notified 10/04/20  Time Provider Notified 1305  Notification Type Page  Notification Reason Change in status (high BP)  Provider response See new orders  Date of Provider Response 10/04/20  Time of Provider Response 1306  Document  Progress note created (see row info) Yes

## 2020-10-04 NOTE — Progress Notes (Signed)
Orthopaedic Trauma Service Progress Note  Patient ID: ANSLEI KLAMER MRN: [place]:[id number] DOB/AGE: 54-Feb-1967 54 y.o.  Subjective:  No acute issues Refusing to get out of bed   ROS As above  Objective:   VITALS:   Vitals:   10/03/20 1305 10/03/20 2100 10/04/20 0419 10/04/20 0801  BP: (!) 156/74 (!) 145/75 (!) 160/68 (!) 184/76  Pulse: 90 88 86 85  Resp: '18 18 16 17  '$ Temp: 98.8 F (37.1 C) 98 F (36.7 C) 98.7 F (37.1 C) 99 F (37.2 C)  TempSrc: Oral Oral Oral Oral  SpO2: 92% 95% 97% 92%  Weight:      Height:        Estimated body mass index is 26.05 kg/m as calculated from the following:   Height as of this encounter: 5' (1.524 m).   Weight as of this encounter: 60.5 kg.   Intake/Output      04/01 0701 04/02 0700 04/02 0701 04/03 0700   I.V. (mL/kg)     IV Piggyback 50    Total Intake(mL/kg) 50 (0.8)    Other 500    Total Output 500    Net -450           LABS  Results for orders placed or performed during the hospital encounter of 10/02/20 (from the past 24 hour(s))  Glucose, capillary     Status: Abnormal   Collection Time: 10/03/20  1:02 PM  Result Value Ref Range   Glucose-Capillary 113 (H) 70 - 99 mg/dL  Glucose, capillary     Status: Abnormal   Collection Time: 10/03/20  4:07 PM  Result Value Ref Range   Glucose-Capillary 117 (H) 70 - 99 mg/dL  Glucose, capillary     Status: None   Collection Time: 10/03/20  9:01 PM  Result Value Ref Range   Glucose-Capillary 95 70 - 99 mg/dL  Glucose, capillary     Status: None   Collection Time: 10/03/20  9:34 PM  Result Value Ref Range   Glucose-Capillary 91 70 - 99 mg/dL  CBC     Status: Abnormal   Collection Time: 10/04/20  3:44 AM  Result Value Ref Range   WBC 10.1 4.0 - 10.5 K/uL   RBC 3.45 (L) 3.87 - 5.11 MIL/uL   Hemoglobin 9.8 (L) 12.0 - 15.0 g/dL   HCT 30.6 (L) 36.0 - 46.0 %   MCV 88.7 80.0 - 100.0 fL   MCH 28.4 26.0 -  34.0 pg   MCHC 32.0 30.0 - 36.0 g/dL   RDW 18.6 (H) 11.5 - 15.5 %   Platelets 134 (L) 150 - 400 K/uL   nRBC 0.0 0.0 - 0.2 %  Basic metabolic panel     Status: Abnormal   Collection Time: 10/04/20  3:44 AM  Result Value Ref Range   Sodium 134 (L) 135 - 145 mmol/L   Potassium 5.0 3.5 - 5.1 mmol/L   Chloride 99 98 - 111 mmol/L   CO2 24 22 - 32 mmol/L   Glucose, Bld 81 70 - 99 mg/dL   BUN 22 (H) 6 - 20 mg/dL   Creatinine, Ser 3.84 (H) 0.44 - 1.00 mg/dL   Calcium 8.5 (L) 8.9 - 10.3 mg/dL   GFR, Estimated 13 (L) >60 mL/min   Anion gap 11 5 - 15  Glucose,  capillary     Status: None   Collection Time: 10/04/20  6:49 AM  Result Value Ref Range   Glucose-Capillary 93 70 - 99 mg/dL  Glucose, capillary     Status: Abnormal   Collection Time: 10/04/20 11:31 AM  Result Value Ref Range   Glucose-Capillary 123 (H) 70 - 99 mg/dL     PHYSICAL EXAM:   Gen: NAD Ext:       Right Lower Extremity   Dressing c/d/i  Ext warm   Motor and sensory functions intact  No swelling of significance   Assessment/Plan: 2 Days Post-Op   Principal Problem:   Fracture of femoral neck, right (HCC) Active Problems:   ESRD (end stage renal disease) (HCC)   Anti-infectives (From admission, onward)   Start     Dose/Rate Route Frequency Ordered Stop   10/03/20 1700  ceFAZolin (ANCEF) IVPB 1 g/50 mL premix        1 g 100 mL/hr over 30 Minutes Intravenous Daily 10/02/20 1841 10/03/20 1648   10/03/20 0600  ceFAZolin (ANCEF) IVPB 2g/100 mL premix        2 g 200 mL/hr over 30 Minutes Intravenous On call to O.R. 10/02/20 1507 10/02/20 1639   10/02/20 1510  ceFAZolin (ANCEF) 2-4 GM/100ML-% IVPB       Note to Pharmacy: [place], [place]   : cabinet override      10/02/20 1510 10/02/20 1642    .  POD/HD#: 2  55 y/o female with numerous medical comorbidities with nondisplaced R femoral neck fracture s/p percutaneous pinning    -R femoral neck fracture s/p percutaneous pinning              PWB R leg (50%)              Unrestricted ROM R hip and knee             PT/OT             Ice PRN              Dressing changes as needed starting today                           Dc sutures R hip around 10/16/2020     NEEDS TO GET OUT OF BED TODAY!!!!  - Pain management:             Multimodal             Minimize narcotics    - DVT/PE prophylaxis:             Heparin while inpt             Given type of fracture and surgery, as well as ESRD do not think she needs formal anticoagulation post discharge              If she goes to SNF would continue sq heparin x 21 days   - ID:              periop abx completed              Renally dosed    - Metabolic Bone Disease:             Per renal    - Impediments to fracture healing:             ESRD, metabolic bone disease, DM   - Dispo:  Therapy evals             Ortho issues stable       Jari Pigg, PA-C [phone] (C) 10/04/2020, 11:55 AM  [facility] [place] [phone] Jenetta [phone] (F)    After 5pm and on the weekends please log on to Amion, go to orthopaedics and the look under the [facility] Call for the provider(s) on call. You can also call our office at [phone] and then follow the prompts to be connected to the call team.
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
## 2020-10-04 NOTE — Progress Notes (Signed)
   10/04/20 1120  OTHER  Substance Abuse Education Offered No  (CAGE-AID) Substance Abuse Screening Tool  Have You Ever Felt You Ought to Cut Down on Your Drinking or Drug Use? 0  Have People Annoyed You By Critizing Your Drinking Or Drug Use? 0  Have You Felt Bad Or Guilty About Your Drinking Or Drug Use? 0  Have You Ever Had a Drink or Used Drugs First Thing In The Morning to Steady Your Nerves or to Get Rid of a Hangover? 0  CAGE-AID Score 0   

## 2020-10-04 NOTE — Progress Notes (Addendum)
HD#2 Subjective:   No significant overnight events. Continues to have pain. Discussed that we will work on controlling this better to allow her to start working with PT  Objective:   Vital signs in last 24 hours: Vitals:   10/03/20 2100 10/04/20 0419 10/04/20 0801 10/04/20 1300  BP: (!) 145/75 (!) 160/68 (!) 184/76 (!) 186/81  Pulse: 88 86 85 (!) 112  Resp: '18 16 17   '$ Temp: 98 F (36.7 C) 98.7 F (37.1 C) 99 F (37.2 C)   TempSrc: Oral Oral Oral   SpO2: 95% 97% 92%   Weight:      Height:        Physical Exam General: chronically ill appearing, in NAD Cardiac: RRR Skin: right lateral hip surgical incision covered with a intact and clean dressing without surrounding erythema  Pertinent Labs: CBC Latest Ref Rng & Units 10/04/2020 10/03/2020 10/02/2020  WBC 4.0 - 10.5 K/uL 10.1 15.3(H) 8.5  Hemoglobin 12.0 - 15.0 g/dL 9.8(L) 10.6(L) 12.7  Hematocrit 36.0 - 46.0 % 30.6(L) 33.7(L) 41.0  Platelets 150 - 400 K/uL 134(L) 138(L) 139(L)    CMP Latest Ref Rng & Units 10/04/2020 10/03/2020 10/02/2020  Glucose 70 - 99 mg/dL 81 147(H) 186(H)  BUN 6 - 20 mg/dL 22(H) 49(H) 38(H)  Creatinine 0.44 - 1.00 mg/dL 3.84(H) 6.12(H) 5.38(H)  Sodium 135 - 145 mmol/L 134(L) 136 133(L)  Potassium 3.5 - 5.1 mmol/L 5.0 5.4(H) 5.1  Chloride 98 - 111 mmol/L 99 100 98  CO2 22 - 32 mmol/L '24 23 23  '$ Calcium 8.9 - 10.3 mg/dL 8.5(L) 8.7(L) 8.6(L)  Total Protein 6.5 - 8.1 g/dL - - 6.8  Total Bilirubin 0.3 - 1.2 mg/dL - - 1.3(H)  Alkaline Phos 38 - 126 U/L - - 83  AST 15 - 41 U/L - - 23  ALT 0 - 44 U/L - - 17      Assessment/Plan:   Principal Problem:   Fracture of femoral neck, right (HCC) Active Problems:   ESRD (end stage renal disease) ([place])   Patient Summary:  [name] [name] is a 55 y.o. woman with history of ESRD on HD MWF, diabetes type 2, hypertension, NAFLD, GERD, history of recurrent UTIs, and chronic bilateral lower extremity weakness who presented after a fall and found to have a R  femoral neck fracture. She underwent percutaneous pinning and is now awaiting placement in a skilled facility for ongoing rehabilitation.   Right femoral neck fracture s/p percutaneous pinning 3/31 Baseline diminished mobility Post operative anemia. Hgb is down from admission, but not unexpected. May continue to see a decline still over the next [place] per orthopedics Mobility restrictions: PWB R leg, unrestricted ROM Therapy: PT and OT are on board. TOC on board to assist with placement  Dressing changes prn Pain management: oxycodone increased to '10mg'$  q4h prn, ice packs No need for VTE prophylaxis at discharge Follow up needs: suture removal ~4/14  Fever (resolved). Single reading of temp of 101.2 in the ED.  Sepsis r/o--sepsis physiology not present, blood cultures remain without growth, no fevers since admission. May have been 2/2 acute trauma/pain vs inaccurate reading. No sign of infection at this time.  -Will continue to monitor blood cultures.  ESRD on HD. Management per nephrology.  Type 2 diabetes mellitus. CBGs at goal. Continue lantus and SSI  Uncontrolled hypertension. Blood pressures remain above goal. Suspect this is partially attributable to pain. -continue amlodipine and hydralazine -resume home imdur and coreg  VTE: Heparin  Code: Full Dispo: will be medically stable for discharge pending PT eval. Appreciate TOC assistance  Please contact the on call pager after 5 pm and on weekends at [phone].  Mitzi Hansen, MD Internal Medicine Resident PGY-2 [facility] Pager: [phone] 10/04/2020 2:20 PM

## 2020-10-05 LAB — RENAL FUNCTION PANEL
Albumin: 2.9 g/dL — ABNORMAL LOW (ref 3.5–5.0)
Anion gap: 14 (ref 5–15)
BUN: 33 mg/dL — ABNORMAL HIGH (ref 6–20)
CO2: 24 mmol/L (ref 22–32)
Calcium: 8.6 mg/dL — ABNORMAL LOW (ref 8.9–10.3)
Chloride: 98 mmol/L (ref 98–111)
Creatinine, Ser: 5.38 mg/dL — ABNORMAL HIGH (ref 0.44–1.00)
GFR, Estimated: 9 mL/min — ABNORMAL LOW (ref >60)
Glucose, Bld: 76 mg/dL (ref 70–99)
Phosphorus: 5.7 mg/dL — ABNORMAL HIGH (ref 2.5–4.6)
Potassium: 4.6 mmol/L (ref 3.5–5.1)
Sodium: 136 mmol/L (ref 135–145)

## 2020-10-05 LAB — GLUCOSE, CAPILLARY
Glucose-Capillary: 113 mg/dL — ABNORMAL HIGH (ref 70–99)
Glucose-Capillary: 129 mg/dL — ABNORMAL HIGH (ref 70–99)
Glucose-Capillary: 109 mg/dL — ABNORMAL HIGH (ref 70–99)
Glucose-Capillary: 119 mg/dL — ABNORMAL HIGH (ref 70–99)
Glucose-Capillary: 124 mg/dL — ABNORMAL HIGH (ref 70–99)

## 2020-10-05 LAB — CBC
HCT: 31 % — ABNORMAL LOW (ref 36.0–46.0)
Hemoglobin: 9.6 g/dL — ABNORMAL LOW (ref 12.0–15.0)
MCH: 27.4 pg (ref 26.0–34.0)
MCHC: 31 g/dL (ref 30.0–36.0)
MCV: 88.6 fL (ref 80.0–100.0)
Platelets: 145 10*3/uL — ABNORMAL LOW (ref 150–400)
RBC: 3.5 MIL/uL — ABNORMAL LOW (ref 3.87–5.11)
RDW: 18.1 % — ABNORMAL HIGH (ref 11.5–15.5)
WBC: 7.8 10*3/uL (ref 4.0–10.5)
nRBC: 0 % (ref 0.0–0.2)

## 2020-10-05 MED ORDER — SENNOSIDES-DOCUSATE SODIUM 8.6-50 MG PO TABS
1.0000 | ORAL_TABLET | Freq: Two times a day (BID) | ORAL | Status: DC
  Administered 2020-10-05 (×2): 1 via ORAL
  Filled 2020-10-05 (×3): qty 1

## 2020-10-05 MED ORDER — POLYETHYLENE GLYCOL 3350 17 G PO PACK
17.0000 g | PACK | Freq: Every day | ORAL | Status: DC
  Administered 2020-10-05: 17 g via ORAL
  Filled 2020-10-05 (×2): qty 1

## 2020-10-05 MED ORDER — INSULIN GLARGINE 100 UNIT/ML ~~LOC~~ SOLN
5.0000 [IU] | Freq: Every day | SUBCUTANEOUS | Status: DC
  Administered 2020-10-05 – 2020-10-11 (×6): 5 [IU] via SUBCUTANEOUS
  Filled 2020-10-05 (×8): qty 0.05

## 2020-10-05 NOTE — Progress Notes (Signed)
1820 received page from RN reporting patient has bruising on left shin which is swollen and warm to touch. Requesting provider at bedside.   Subjective: Presented to bedside to evaluate patient. Patient resting comfortably. Had just been given dose of PRN pain medication and reporting improvement in her LLE pain. She reports she only noticed pain in the lower extremity after getting up from sitting in bedside chair. She told her nurse who examined the leg and noticed the above changes which she reports were not present earlier in the day.   Objective: On last check, patient's vitals were stable, T 97.5, P 59, RR 16, BP 131/68, spO2 98% on room air. Constitutional: patient resting comfortably in bed, in no acute distress Cardiac: RRR, no m/r/g Pulm: Patient breathing comfortably on room air, speaking in full sentences without dyspnea Extremities/Skin: LLE swollen and warm to touch, no tenderness to palpation, tenderness with passive and active dorsiflexion, skin is firm (see pic below); DP pulses 2+ bilaterally. Sensation intact to light touch.   Assessment/Plan: Patient with onset of LLE pain and swelling about 1 hour ago. No numbness or paresthesias. HDS. Appears comfortable. Pain in leg with active/passive dorsiflexion. DP pulses intact. Patient has been receiving prophylactic dose of heparin. Worked with PT earlier in the day. Given recent surgery and exam findings Rock Nephew' score of 3), I am concerned for DVT. - STAT LLE venous doppler  Alexandria Lodge, MD Internal Medicine Resident, PGY-1 [facility] Pager: [phone] 7:21 PM, 10/05/2020

## 2020-10-05 NOTE — Plan of Care (Signed)

## 2020-10-05 NOTE — Progress Notes (Signed)
[facility] Progress Note   Subjective:   Ongoing nausea and expected post op pain.   No other issues.  Objective Vitals:   10/04/20 1954 10/05/20 0330 10/05/20 0826 10/05/20 0934  BP: (!) 159/75 (!) 165/74 (!) 156/72 (!) 103/57  Pulse: 78 73 (!) 59 (!) 57  Resp: '16 16 16   '$ Temp: 98.2 F (36.8 C) 98.8 F (37.1 C) (!) 97.5 F (36.4 C)   TempSrc: Oral  Oral   SpO2: 96% 100% 100%   Weight:      Height:       Physical Exam Gen: awake and calm Eyes: anciteric ENT: EOMI Neck: supple CV:  RRR Lungs: clear ant Extr:  No edema Neuro:  nonfocal Skin: LIJ TDC c/d/i  Additional Objective Labs: Basic Metabolic Panel: Recent Labs  Lab 10/03/20 0327 10/04/20 0344 10/05/20 0239  NA 136 134* 136  K 5.4* 5.0 4.6  CL 100 99 98  CO2 '23 24 24  '$ GLUCOSE 147* 81 76  BUN 49* 22* 33*  CREATININE 6.12* 3.84* 5.38*  CALCIUM 8.7* 8.5* 8.6*  PHOS 6.5*  --  5.7*   Liver Function Tests: Recent Labs  Lab 10/02/20 0603 10/05/20 0239  AST 23  --   ALT 17  --   ALKPHOS 83  --   BILITOT 1.3*  --   PROT 6.8  --   ALBUMIN 3.2* 2.9*   No results for input(s): LIPASE, AMYLASE in the last 168 hours. CBC: Recent Labs  Lab 10/02/20 0603 10/03/20 0327 10/04/20 0344 10/05/20 0239  WBC 8.5 15.3* 10.1 7.8  NEUTROABS 6.1  --   --   --   HGB 12.7 10.6* 9.8* 9.6*  HCT 41.0 33.7* 30.6* 31.0*  MCV 89.5 89.6 88.7 88.6  PLT 139* 138* 134* 145*   Blood Culture    Component Value Date/Time   SDES BLOOD RIGHT HAND 10/02/2020 0755   SPECREQUEST  10/02/2020 0755    BOTTLES DRAWN AEROBIC AND ANAEROBIC Blood Culture results may not be optimal due to an inadequate volume of blood received in culture bottles   CULT  10/02/2020 0755    NO GROWTH 2 DAYS Performed at [facility] [address], [place], [place] [address]    REPTSTATUS PENDING 10/02/2020 [id number]    Cardiac Enzymes: No results for input(s): CKTOTAL, CKMB, CKMBINDEX, TROPONINI in the last 168  hours. CBG: Recent Labs  Lab 10/04/20 1131 10/04/20 1624 10/04/20 1956 10/05/20 0653 10/05/20 1119  GLUCAP 123* 93 91 113* 129*   Iron Studies: No results for input(s): IRON, TIBC, TRANSFERRIN, FERRITIN in the last 72 hours. '@lablastinr3'$ @ Studies/Results: No results found. Medications:  . amLODipine  10 mg Oral Daily  . calcium carbonate  2 tablet Oral TID AC  . carvedilol  25 mg Oral BID  . Chlorhexidine Gluconate Cloth  6 each Topical Q0600  . heparin injection (subcutaneous)  5,000 Units Subcutaneous Q8H  . hydrALAZINE  50 mg Oral Q8H  . insulin aspart  0-9 Units Subcutaneous TID WC  . insulin glargine  5 Units Subcutaneous QHS  . isosorbide dinitrate  20 mg Oral TID  . lidocaine  1 patch Transdermal Q24H  . polyethylene glycol  17 g Oral Daily  . senna-docusate  1 tablet Oral BID   HD orders:  [facility] 292mn BFR 350 DFR 600 EDW 62kg 2K/2.5Ca F180, heparin 5000 bolus and 500/hr maint  Assessment/Plan **Hip fracture: s/p operative management per ortho.  PT eval to determine dispo.  Please avoid morphine in this ESRD patient.    **ESRD on HD MWF:  s/p HD Friday per schedule.  Will plan next HD Monday per usual schedule.  Labs/vol fine today.  **BMM: clarified - she takes ca carbonate 2 TIDAC as binder, Phos - 6.5, resumed home binder; corr ca ok.   **DM: insulin per primary  **HTN: continue home meds; per outpt HD records frequent HTN.   **Anemia:  Hb 10.6 > 9.8 > 9.6. post op.  Trend. Check iron indices.   Jannifer Hick MD 10/05/2020, 11:45 AM  [facility] Pager: [phone]
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
## 2020-10-05 NOTE — Progress Notes (Signed)
HD#3 Subjective:   No significant overnight events. Yesterday was too tired after working with OT to work with PT. Patient reports her hip pain is improved today. Has ongoing nausea which is responsive to PRN antiemetics. States she tried eating a cheeseburger yesterday but could not tolerate. Denies abdominal pain. States she has not had a BM since before admission. Takes stool softeners and occasionally suppositories.   Objective:   Vital signs in last 24 hours: Vitals:   10/04/20 1402 10/04/20 1629 10/04/20 1954 10/05/20 0330  BP: (!) 186/84 (!) 193/76 (!) 159/75 (!) 165/74  Pulse: 75 80 78 73  Resp: '18 17 16 16  '$ Temp: 98.8 F (37.1 C) 99.1 F (37.3 C) 98.2 F (36.8 C) 98.8 F (37.1 C)  TempSrc: Oral Oral Oral   SpO2: 93% 96% 96% 100%  Weight:      Height:        Physical Exam General: chronically ill appearing, in NAD Cardiac: RRR, no lower extremity edema Abdominal: soft, non-tender, non-distended Skin: right lateral hip surgical incision covered with a intact and clean dressing without surrounding erythema  Pertinent Labs: CBC Latest Ref Rng & Units 10/05/2020 10/04/2020 10/03/2020  WBC 4.0 - 10.5 K/uL 7.8 10.1 15.3(H)  Hemoglobin 12.0 - 15.0 g/dL 9.6(L) 9.8(L) 10.6(L)  Hematocrit 36.0 - 46.0 % 31.0(L) 30.6(L) 33.7(L)  Platelets 150 - 400 K/uL 145(L) 134(L) 138(L)    CMP Latest Ref Rng & Units 10/05/2020 10/04/2020 10/03/2020  Glucose 70 - 99 mg/dL 76 81 147(H)  BUN 6 - 20 mg/dL 33(H) 22(H) 49(H)  Creatinine 0.44 - 1.00 mg/dL 5.38(H) 3.84(H) 6.12(H)  Sodium 135 - 145 mmol/L 136 134(L) 136  Potassium 3.5 - 5.1 mmol/L 4.6 5.0 5.4(H)  Chloride 98 - 111 mmol/L 98 99 100  CO2 22 - 32 mmol/L '24 24 23  '$ Calcium 8.9 - 10.3 mg/dL 8.6(L) 8.5(L) 8.7(L)  Total Protein 6.5 - 8.1 g/dL - - -  Total Bilirubin 0.3 - 1.2 mg/dL - - -  Alkaline Phos 38 - 126 U/L - - -  AST 15 - 41 U/L - - -  ALT 0 - 44 U/L - - -      Assessment/Plan:   Principal Problem:   Fracture of femoral  neck, right (HCC) Active Problems:   ESRD (end stage renal disease) (HCC)   Patient Summary:  [name] [name] is a 55 y.o. woman with history of ESRD on HD MWF, diabetes type 2, hypertension, NAFLD, GERD, history of recurrent UTIs, and chronic bilateral lower extremity weakness who presented after a fall and found to have a R femoral neck fracture. She underwent percutaneous pinning and is now awaiting placement in a skilled facility for ongoing rehabilitation.  This is hospital day 3.   Right femoral neck fracture s/p percutaneous pinning 3/31 (POD 3) Baseline diminished mobility Post operative anemia. Hgb is down from admission, but not unexpected. May continue to see a decline still over the next day. Has persistent post-operative nausea with some vomiting yesterday. Will escalate bowel regimen and work to deescalate pain regimen as able. Plan per orthopedics Mobility restrictions: PWB R leg, unrestricted ROM Therapy: PT and OT are on board. TOC on board to assist with placement  Dressing changes prn Pain management: oxycodone '10mg'$  q4h prn, Dilaudid 0.5-1 mg q2h prn, ice packs, lidocaine patch. No need for VTE prophylaxis at discharge Follow-up needs: suture removal ~4/14 - Start Miralax and Senokot-S  Fever (resolved). Single reading of temp of 101.2 in  the ED.  Sepsis ruled out. Sepsis physiology not present, blood cultures remain without growth, no fevers since admission. May have been 2/2 acute trauma/pain vs inaccurate reading. No sign of infection at this time.  - Blood cultures NGTD  ESRD on HD. Management per nephrology.  Type 2 diabetes mellitus. CBGs at goal but will decrease Lantus 8>5 given fasting glucose of 76 this morning. Continue lantus and SSI.  Uncontrolled hypertension. Blood pressures remain above goal but improving. Suspect this is partially attributable to pain. -continue amlodipine, hydralazine, imdur, coreg  VTE: Heparin Code: Full Dispo: will be  medically stable for discharge pending PT eval. Appreciate TOC assistance.  Please contact the on call pager after 5 pm and on weekends at [phone].  Alexandria Lodge, MD Internal Medicine Resident, PGY-1 [facility] Pager: [phone] 6:15 AM, 10/05/2020

## 2020-10-05 NOTE — TOC Initial Note (Signed)
Transition of Care [facility]) - Initial/Assessment Note    Patient Details  Name: [name] [name] MRN: [id number] Date of Birth: 12-Jun-1966  Transition of Care [facility]) CM/SW Contact:    [address], [place] Phone Number: 10/05/2020, 1:35 PM  Clinical Narrative:                 CSW called pt to talk about possible dc plans of SNF placement, pt stated she was too tired to talk and requested to speak to CSW tomorrow.         Patient Goals and CMS Choice        Expected Discharge Plan and Services                                                Prior Living Arrangements/Services                       Activities of Daily Living      Permission Sought/Granted                  Emotional Assessment              Admission diagnosis:  Hip fracture ([place]) [S72.009A] End stage renal disease on dialysis ([place]) [N18.6, Z99.2] Knee pain, right [M25.561] Closed fracture of neck of right femur, initial encounter ([place]) [S72.001A] Fall, initial encounter [W19.XXXA] Fever, unspecified fever cause [R50.9] Patient Active Problem List   Diagnosis Date Noted  . Fracture of femoral neck, right ([place]) 10/02/2020  . Pressure injury of skin 03/12/2020  . Sepsis ([place]) 03/07/2020  . ESRD (end stage renal disease) ([place]) 03/07/2020  . Hyperkalemia 03/07/2020  . Acute cystitis 03/07/2020  . Acute metabolic encephalopathy [id number]  . 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 Diabetes mellitus with complication ([place])   . Intractable headache   . Syncope 07/28/2015  . Chronic headaches 07/28/2015  . Chest pain 05/22/2015  . Urinary frequency 03/13/2015  . Easy bruising 10/09/2014  . Encounter for preventative adult health care exam with abnormal findings 01/11/2014  . Chronic constipation 01/11/2014  . GERD (gastroesophageal reflux disease) 01/11/2014  . Helicobacter positive gastritis 12/06/2013  . Dizziness and giddiness 10/12/2013  . Essential hypertension  09/19/2013  . Back pain 06/11/2013  . Lumbosacral spondylosis without myelopathy 02/20/2013  . Diabetic neuropathy ([place]) 01/09/2013  . DM (diabetes mellitus), type 2, uncontrolled ([place]) 10/02/2012  . MITRAL REGURGITATION, mild 10/31/2009  . PLEURAL EFFUSION, RIGHT after pyelonephritis in April 2011 (no intervention) 10/20/2009   PCP:  Myrtis Hopping., MD Pharmacy:   [facility] [phone] - [place], [address] AT [address] [place] [address] Phone: [phone] Fax: [phone]     Social Determinants of Health (SDOH) Interventions    Readmission Risk Interventions Readmission Risk Prevention Plan 06/03/2020  Transportation Screening Complete  PCP or Specialist Appt within 3-5 Days Complete  HRI or [place] Complete  Social Work Consult for [place] Planning/Counseling Complete  Palliative Care Screening Not Applicable  Medication Review Press photographer) Complete  Some recent data might be hidden

## 2020-10-05 NOTE — Progress Notes (Signed)
VAS [place] lower extremity ordered stat at 1850 for suspected DVT of left lower extremity. Called Vascular and ultrasound but no non-emergent diagnostic testing available after 1900 today. Patient denies chest pain, SOB, dizziness, and VS WNL. Notified oncall provider Dr. Collene Gobble of situation. He recommended to monitor patient closely. Will monitor patient and update for any pt status changes.

## 2020-10-06 ENCOUNTER — Inpatient Hospital Stay (HOSPITAL_COMMUNITY): Payer: Medicare Other

## 2020-10-06 DIAGNOSIS — M79662 Pain in left lower leg: Secondary | ICD-10-CM | POA: Diagnosis not present

## 2020-10-06 DIAGNOSIS — R233 Spontaneous ecchymoses: Secondary | ICD-10-CM | POA: Diagnosis not present

## 2020-10-06 LAB — GLUCOSE, CAPILLARY
Glucose-Capillary: 89 mg/dL (ref 70–99)
Glucose-Capillary: 106 mg/dL — ABNORMAL HIGH (ref 70–99)
Glucose-Capillary: 116 mg/dL — ABNORMAL HIGH (ref 70–99)
Glucose-Capillary: 157 mg/dL — ABNORMAL HIGH (ref 70–99)

## 2020-10-06 LAB — CBC WITH DIFFERENTIAL/PLATELET
Abs Immature Granulocytes: 0.02 10*3/uL (ref 0.00–0.07)
Basophils Absolute: 0.1 10*3/uL (ref 0.0–0.1)
Basophils Relative: 1 %
Eosinophils Absolute: 0.3 10*3/uL (ref 0.0–0.5)
Eosinophils Relative: 4 %
HCT: 29.8 % — ABNORMAL LOW (ref 36.0–46.0)
Hemoglobin: 9.3 g/dL — ABNORMAL LOW (ref 12.0–15.0)
Immature Granulocytes: 0 %
Lymphocytes Relative: 33 %
Lymphs Abs: 2 10*3/uL (ref 0.7–4.0)
MCH: 27.7 pg (ref 26.0–34.0)
MCHC: 31.2 g/dL (ref 30.0–36.0)
MCV: 88.7 fL (ref 80.0–100.0)
Monocytes Absolute: 0.8 10*3/uL (ref 0.1–1.0)
Monocytes Relative: 13 %
Neutro Abs: 2.9 10*3/uL (ref 1.7–7.7)
Neutrophils Relative %: 49 %
Platelets: 165 10*3/uL (ref 150–400)
RBC: 3.36 MIL/uL — ABNORMAL LOW (ref 3.87–5.11)
RDW: 17.9 % — ABNORMAL HIGH (ref 11.5–15.5)
WBC: 6.1 10*3/uL (ref 4.0–10.5)
nRBC: 0.3 % — ABNORMAL HIGH (ref 0.0–0.2)

## 2020-10-06 LAB — RENAL FUNCTION PANEL
Albumin: 2.8 g/dL — ABNORMAL LOW (ref 3.5–5.0)
Anion gap: 14 (ref 5–15)
BUN: 46 mg/dL — ABNORMAL HIGH (ref 6–20)
CO2: 27 mmol/L (ref 22–32)
Calcium: 8.5 mg/dL — ABNORMAL LOW (ref 8.9–10.3)
Chloride: 93 mmol/L — ABNORMAL LOW (ref 98–111)
Creatinine, Ser: 6.66 mg/dL — ABNORMAL HIGH (ref 0.44–1.00)
GFR, Estimated: 7 mL/min — ABNORMAL LOW (ref >60)
Glucose, Bld: 103 mg/dL — ABNORMAL HIGH (ref 70–99)
Phosphorus: 5.8 mg/dL — ABNORMAL HIGH (ref 2.5–4.6)
Potassium: 4.8 mmol/L (ref 3.5–5.1)
Sodium: 134 mmol/L — ABNORMAL LOW (ref 135–145)

## 2020-10-06 LAB — IRON AND TIBC
Iron: 57 ug/dL (ref 28–170)
Saturation Ratios: 32 % — ABNORMAL HIGH (ref 10.4–31.8)
TIBC: 178 ug/dL — ABNORMAL LOW (ref 250–450)
UIBC: 121 ug/dL

## 2020-10-06 LAB — FERRITIN: Ferritin: 902 ng/mL — ABNORMAL HIGH (ref 11–307)

## 2020-10-06 MED ORDER — OXYCODONE HCL 5 MG PO TABS
5.0000 mg | ORAL_TABLET | ORAL | Status: DC | PRN
Start: 2020-10-06 — End: 2020-10-08
  Administered 2020-10-06 – 2020-10-08 (×7): 5 mg via ORAL
  Filled 2020-10-06 (×7): qty 1

## 2020-10-06 MED ORDER — POLYETHYLENE GLYCOL 3350 17 G PO PACK
17.0000 g | PACK | Freq: Two times a day (BID) | ORAL | Status: DC
  Administered 2020-10-06 – 2020-10-15 (×12): 17 g via ORAL
  Filled 2020-10-06 (×18): qty 1

## 2020-10-06 MED ORDER — SENNOSIDES-DOCUSATE SODIUM 8.6-50 MG PO TABS
2.0000 | ORAL_TABLET | Freq: Two times a day (BID) | ORAL | Status: DC
  Administered 2020-10-06 – 2020-10-16 (×18): 2 via ORAL
  Filled 2020-10-06 (×18): qty 2

## 2020-10-06 NOTE — Progress Notes (Signed)
[facility] Progress Note   Subjective:   Still has some hip pain.  For dialysis today.    Objective Vitals:   10/05/20 2013 10/06/20 0537 10/06/20 0814 10/06/20 1303  BP: (!) 148/75 138/62 (!) 121/59 133/67  Pulse: 60 60 60 (!) 56  Resp: '14 16 16   '$ Temp: 98.6 F (37 C) 98.2 F (36.8 C) 98.4 F (36.9 C)   TempSrc:  Oral Oral   SpO2: 100% 93% 95%   Weight:      Height:       Physical Exam Gen: awake, alert, talking on phone HEENT: EOMI PERRL  CV:  RRR Lungs: clear ant Extr:  No edema Neuro:  nonfocal Skin: LIJ TDC c/d/i  Additional Objective Labs: Basic Metabolic Panel: Recent Labs  Lab 10/03/20 0327 10/04/20 0344 10/05/20 0239 10/06/20 0502  NA 136 134* 136 134*  K 5.4* 5.0 4.6 4.8  CL 100 99 98 93*  CO2 '23 24 24 27  '$ GLUCOSE 147* 81 76 103*  BUN 49* 22* 33* 46*  CREATININE 6.12* 3.84* 5.38* 6.66*  CALCIUM 8.7* 8.5* 8.6* 8.5*  PHOS 6.5*  --  5.7* 5.8*   Liver Function Tests: Recent Labs  Lab 10/02/20 0603 10/05/20 0239 10/06/20 0502  AST 23  --   --   ALT 17  --   --   ALKPHOS 83  --   --   BILITOT 1.3*  --   --   PROT 6.8  --   --   ALBUMIN 3.2* 2.9* 2.8*   No results for input(s): LIPASE, AMYLASE in the last 168 hours. CBC: Recent Labs  Lab 10/02/20 0603 10/03/20 0327 10/04/20 0344 10/05/20 0239 10/06/20 0502  WBC 8.5 15.3* 10.1 7.8 6.1  NEUTROABS 6.1  --   --   --  2.9  HGB 12.7 10.6* 9.8* 9.6* 9.3*  HCT 41.0 33.7* 30.6* 31.0* 29.8*  MCV 89.5 89.6 88.7 88.6 88.7  PLT 139* 138* 134* 145* 165   Blood Culture    Component Value Date/Time   SDES BLOOD RIGHT HAND 10/02/2020 0755   SPECREQUEST  10/02/2020 0755    BOTTLES DRAWN AEROBIC AND ANAEROBIC Blood Culture results may not be optimal due to an inadequate volume of blood received in culture bottles   CULT  10/02/2020 0755    NO GROWTH 3 DAYS Performed at [facility], [place] [address], [place], [place] [address]    REPTSTATUS PENDING 10/02/2020 [id number]     Cardiac Enzymes: No results for input(s): CKTOTAL, CKMB, CKMBINDEX, TROPONINI in the last 168 hours. CBG: Recent Labs  Lab 10/05/20 1625 10/05/20 2011 10/05/20 2049 10/06/20 0544 10/06/20 1142  GLUCAP 109* 119* 124* 89 106*   Iron Studies: No results for input(s): IRON, TIBC, TRANSFERRIN, FERRITIN in the last 72 hours. '@lablastinr3'$ @ Studies/Results: VAS [place] LOWER EXTREMITY VENOUS (DVT)  Result Date: 10/06/2020  Lower Venous DVT Study Indications: Left lower extremity Pain, and ecchymosis.  Risk Factors: Status post right femoral neck fracture and pinning. Limitations: Patient could not tolerate compression in thigh or at popliteal fossa secondary to pain. Comparison Study: No prior study on file Performing Technologist: Sharion Dove RVS  Examination Guidelines: A complete evaluation includes B-mode imaging, spectral Doppler, color Doppler, and power Doppler as needed of all accessible portions of each vessel. Bilateral testing is considered an integral part of a complete examination. Limited examinations for reoccurring indications may be performed as noted. The reflux portion of the exam is performed with the  patient in reverse Trendelenburg.  +-----+---------------+---------+-----------+----------+--------------+ RIGHTCompressibilityPhasicitySpontaneityPropertiesThrombus Aging +-----+---------------+---------+-----------+----------+--------------+ CFV                 Yes      Yes                                 +-----+---------------+---------+-----------+----------+--------------+   +---------+---------------+---------+-----------+----------+-------------------+ LEFT     CompressibilityPhasicitySpontaneityPropertiesThrombus Aging      +---------+---------------+---------+-----------+----------+-------------------+ CFV                     Yes      Yes                  patent by color and                                                       Doppler              +---------+---------------+---------+-----------+----------+-------------------+ SFJ                                                   Not well visualized +---------+---------------+---------+-----------+----------+-------------------+ FV Prox                 Yes      Yes                  patent by color and                                                       Doppler             +---------+---------------+---------+-----------+----------+-------------------+ FV Mid                  Yes      Yes                  patent by color and                                                       Doppler             +---------+---------------+---------+-----------+----------+-------------------+ FV Distal               Yes      Yes                  patent by color and                                                       Doppler             +---------+---------------+---------+-----------+----------+-------------------+ PFV  Yes      Yes                  patent by color and                                                       Doppler             +---------+---------------+---------+-----------+----------+-------------------+ POP                     Yes      Yes                  patent by color and                                                       Doppler             +---------+---------------+---------+-----------+----------+-------------------+ PTV      Full                                                             +---------+---------------+---------+-----------+----------+-------------------+ PERO     Full                                                             +---------+---------------+---------+-----------+----------+-------------------+    Summary: RIGHT: - No evidence of common femoral vein obstruction.  LEFT: - There is no evidence of deep vein thrombosis in the lower extremity. However, portions  of this examination were limited- see technologist comments above.  *See table(s) above for measurements and observations.    Preliminary    Medications:  . amLODipine  10 mg Oral Daily  . calcium carbonate  2 tablet Oral TID AC  . carvedilol  25 mg Oral BID  . Chlorhexidine Gluconate Cloth  6 each Topical Q0600  . heparin injection (subcutaneous)  5,000 Units Subcutaneous Q8H  . hydrALAZINE  50 mg Oral Q8H  . insulin aspart  0-9 Units Subcutaneous TID WC  . insulin glargine  5 Units Subcutaneous QHS  . isosorbide dinitrate  20 mg Oral TID  . lidocaine  1 patch Transdermal Q24H  . polyethylene glycol  17 g Oral BID  . senna-docusate  2 tablet Oral BID   HD orders:  [facility] 2103mn BFR 350 DFR 600 EDW 62kg 2K/2.5Ca F180, heparin 5000 bolus and 500/hr maint  Assessment/Plan **Hip fracture: s/p operative management per ortho.  PT eval to determine dispo.  Please avoid morphine in this ESRD patient.  No evidence of DVT although limited exam on 10/06/20  **ESRD on HD MWF:  s/p HD Friday per schedule.  Continue MWF schedule  **BMM: clarified - she takes ca carbonate 2 TIDAC as binder, Phos - 6.5, resumed home binder; corr ca  ok.   **DM: insulin per primary  **HTN: continue home meds; per outpt HD records frequent HTN.   **Anemia:  Hb 10.6 > 9.8 > 9.6. post op.  Trend. Check iron indices.   Jannifer Hick MD 10/06/2020, 2:40 PM  [facility] Pager: [phone]

## 2020-10-06 NOTE — TOC Progression Note (Addendum)
Transition of Care [facility]) - Progression Note    Patient Details  Name: [name] [name] MRN: [id number] Date of Birth: [birth year]  Transition of Care [facility]) CM/SW [place], [place] Phone Number: 10/06/2020, 3:09 PM  Clinical Narrative:     CSW called Maudie Mercury with [employer] to inquire about SNF days used by pt and potential SNF cost. Maudie Mercury states that pt used 20 days of SNF benefits and would be started on day 21 so would have a copay. Pt's medicaid would cover her medicare copay but pt would be expected to turn over her check once in copay days.   CSW called Juliann Pulse with [employer] who offered on pt to inquire if they would have same expectation of turning over disability check. She will contact CSW back. CSW was contacted back; Pt would have to turn over check for medicaid to pay copay days.  1600: CSW met with pt and explained that Medicaid would cover copay days but that she would have to turn over disability check for medicaid to pay. Pt states she does not want to go to SNF if she has to turn over her check. CSW discussed HH as alternative option. Pt states she is active with HH. Pt is a little drowsy and mumbles "I'll have to figure something out." Pt requests to stay in the hospital longer. CSW explained that MD would determine discharge but that if she does not want to do SNF then [facility] would be next option. TOC will continue to follow for [facility] needs.   Expected Discharge Plan: Skilled Nursing Facility Barriers to Discharge: SNF Pending bed offer,Insurance Authorization,Continued Medical Work up  Expected Discharge Plan and Services Expected Discharge Plan: [place] Choice: [place] arrangements for the past 2 months: Single Family Home                                       Social Determinants of Health (SDOH) Interventions    Readmission Risk Interventions Readmission Risk Prevention Plan 06/03/2020   Transportation Screening Complete  PCP or Specialist Appt within 3-5 Days Complete  HRI or [place] Complete  Social Work Consult for [place] Planning/Counseling Complete  Palliative Care Screening Not Applicable  Medication Review Press photographer) Complete  Some recent data might be hidden

## 2020-10-06 NOTE — Progress Notes (Signed)
VASCULAR LAB    Left lower extremity venous duplex has been performed.  See CV proc for preliminary results.   Rydge [place], RVT 10/06/2020, 11:21 AM

## 2020-10-06 NOTE — Progress Notes (Signed)
HD#4 Subjective:   Interval events: Yesterday early evening received page from RN that patient had LLE pain and swelling. See 2nd progress note from yesterday for details.   During evaluation at bedside this morning, patient states her hip pain is improved, and her LLE pain and swelling have decreased. She does not recall any trauma to the leg either before or after surgery. She reports her nausea and appetite have improved. Still no BM.  Objective:   Vital signs in last 24 hours: Vitals:   10/05/20 1310 10/05/20 1508 10/05/20 2013 10/06/20 0537  BP: (!) 165/74 131/68 (!) 148/75 138/62  Pulse: 63 (!) 59 60 60  Resp:  '16 14 16  '$ Temp:  (!) 97.5 F (36.4 C) 98.6 F (37 C) 98.2 F (36.8 C)  TempSrc:  Oral  Oral  SpO2:  98% 100% 93%  Weight:      Height:       Physical Exam General: chronically ill appearing, in NAD Cardiac: RRR, no lower extremity edema Abdominal: soft, non-tender, non-distended Skin: right lateral hip surgical incision covered with a intact and clean dressing without surrounding erythema. LLE less swollen and warm to touch, no tenderness to palpation, minimal tenderness with passive and active dorsiflexion, skin is softer this morning; DP pulses 2+ bilaterally. Sensation intact to light touch.  Pertinent Labs: CBC Latest Ref Rng & Units 10/06/2020 10/05/2020 10/04/2020  WBC 4.0 - 10.5 K/uL 6.1 7.8 10.1  Hemoglobin 12.0 - 15.0 g/dL 9.3(L) 9.6(L) 9.8(L)  Hematocrit 36.0 - 46.0 % 29.8(L) 31.0(L) 30.6(L)  Platelets 150 - 400 K/uL 165 145(L) 134(L)    CMP Latest Ref Rng & Units 10/06/2020 10/05/2020 10/04/2020  Glucose 70 - 99 mg/dL 103(H) 76 81  BUN 6 - 20 mg/dL 46(H) 33(H) 22(H)  Creatinine 0.44 - 1.00 mg/dL 6.66(H) 5.38(H) 3.84(H)  Sodium 135 - 145 mmol/L 134(L) 136 134(L)  Potassium 3.5 - 5.1 mmol/L 4.8 4.6 5.0  Chloride 98 - 111 mmol/L 93(L) 98 99  CO2 22 - 32 mmol/L '27 24 24  '$ Calcium 8.9 - 10.3 mg/dL 8.5(L) 8.6(L) 8.5(L)  Total Protein 6.5 - 8.1 g/dL - - -   Total Bilirubin 0.3 - 1.2 mg/dL - - -  Alkaline Phos 38 - 126 U/L - - -  AST 15 - 41 U/L - - -  ALT 0 - 44 U/L - - -      Assessment/Plan:   Principal Problem:   Fracture of femoral neck, right (HCC) Active Problems:   ESRD (end stage renal disease) (HCC)   Patient Summary:  [name] [name] is a 55 y.o. woman with history of ESRD on HD MWF, diabetes type 2, hypertension, NAFLD, GERD, history of recurrent UTIs, and chronic bilateral lower extremity weakness who presented after a fall and found to have a R femoral neck fracture. She underwent percutaneous pinning and is now awaiting placement in a skilled facility for ongoing rehabilitation.  This is hospital day 4.  Right femoral neck fracture s/p percutaneous pinning 3/31 (POD 4) Baseline diminished mobility Post operative anemia. Hgb slowly downtrending from admission, but not unexpected. May continue to see a decline still over the next day. Post-operative nausea and appetite improving but patient still has not had a BM. Will further escalate bowel regimen and continue to deescalate pain regimen. Plan per orthopedics Mobility restrictions: PWB R leg, unrestricted ROM Therapy: PT and OT are on board and recommend SNF. TOC on board to assist with placement. Dressing changes prn Pain  management: oxycodone 5 mg q4h prn, ice packs, lidocaine patch. Follow-up needs: suture removal ~4/14 - Increase Miralax to twice daily and Senokot-S to two tabs twice daily.   Left lower extremity pain and swelling, improving Swelling and discoloration seen by RN after patient reported lower extremity pain yesterday evening.  These changes were not present earlier in the day. Swelling and pain improved this morning. Lower extremity doppler negative for DVT. Patient on daily heparin DVT ppx. Wonder if soft tissue injury during operative positioning. - continue to monitor  Malodorous vaginal discharge Patient found by RN to have malodorous discharge  after bed pan use. Patient does not make urine. Will swab for STIs.  - STI swab  Fever (resolved). Single reading of temp of 101.2 in the ED.  Sepsis ruled out. Sepsis physiology not present, blood cultures remain without growth, no fevers since admission. May have been 2/2 acute trauma/pain vs inaccurate reading. No sign of infection at this time.  - Blood cultures NGTD  ESRD on HD. Management per nephrology.  Type 2 diabetes mellitus. CBGs at goal. Continue lantus 5 QHS and SSI.  Uncontrolled hypertension. Blood pressures remain above goal but steadily improving. - continue amlodipine, hydralazine, imdur, coreg  VTE: Heparin Code: Full Dispo: will be medically stable for discharge pending SNF placement for PT/OT needs. Appreciate TOC assistance.  Please contact the on call pager after 5 pm and on weekends at [phone].  Alexandria Lodge, MD Internal Medicine Resident, PGY-1 [facility] Pager: [phone] 6:31 AM, 10/06/2020

## 2020-10-06 NOTE — TOC Initial Note (Signed)
Transition of Care [facility]) - Initial/Assessment Note    Patient Details  Name: [name] [name] MRN: [id number] Date of Birth: [birth year]  [facility]) CM/SW Contact:    Coralee Pesa, [place] Phone Number: 10/06/2020, 2:29 PM  Clinical Narrative:                 CSW spoke with pt at bedside and explained the recommendation for SNF placement. Pt noted she had been to Snf many times and is agreeable to go again. Pt advised she may be into her copay dys, but does have medicaid to cover it. She could not remember which facilities she had been to, but gave permission for her husband to be contacted. She also said her children were available to help her as well. MEDicare information was requested to give to her husband as she has trouble seeing.  Husband stated he does NOT want her to go back to [place] and she has also been over to [facility]. Pt has NOT had any covid shots. She has Dialysis on MWF at [facility]. CSW will complete workup and faxout, SW will continue to follow for DC planning.  Expected Discharge Plan: Skilled Nursing Facility Barriers to Discharge: SNF Pending bed offer,Insurance Authorization,Continued Medical Work up   Patient Goals and CMS Choice Patient states their goals for this hospitalization and ongoing recovery are:: Pt agreeable to Short term SNF placement. CMS Medicare.gov Compare Post Acute Care list provided to:: Patient Choice offered to / list presented to : Patient  Expected Discharge Plan and Services Expected Discharge Plan: [place] Choice: [place] Living arrangements for the past 2 months: Single Family Home                                      Prior Living Arrangements/Services Living arrangements for the past 2 months: Single Family Home Lives with:: Spouse Patient language and need for interpreter reviewed:: Yes Do you feel safe going back to the place where you live?: Yes      Need  for Family Participation in Patient Care: Yes (Comment) Care giver support system in place?: Yes (comment) Current home services: Homehealth aide Criminal Activity/Legal Involvement Pertinent to Current Situation/Hospitalization: No - Comment as needed  Activities of Daily Living      Permission Sought/Granted Permission sought to share information with : Family Supports Permission granted to share information with : Yes, Verbal Permission Granted  Share Information with NAME: Marhta Loury     Permission granted to share info w Relationship: Spouse  Permission granted to share info w Contact Information: [phone]  Emotional Assessment Appearance:: Appears stated age Attitude/Demeanor/Rapport: Engaged Affect (typically observed): Appropriate Orientation: : Oriented to Self,Oriented to Place,Oriented to  Time,Oriented to Situation Alcohol / Substance Use: Not Applicable Psych Involvement: No (comment)  Admission diagnosis:  Hip fracture ([place]) [S72.009A] End stage renal disease on dialysis ([place]) [N18.6, Z99.2] Knee pain, right [M25.561] Closed fracture of neck of right femur, initial encounter ([place]) [S72.001A] Fall, initial encounter [W19.XXXA] Fever, unspecified fever cause [R50.9] Patient Active Problem List   Diagnosis Date Noted  . Fracture of femoral neck, right ([place]) 10/02/2020  . Pressure injury of skin 03/12/2020  . Sepsis ([place]) 03/07/2020  . ESRD (end stage renal disease) ([place]) 03/07/2020  . Hyperkalemia 03/07/2020  . Acute cystitis 03/07/2020  . Acute metabolic encephalopathy [id number]  .  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 Diabetes mellitus with complication ([place])   . Intractable headache   . Syncope 07/28/2015  . Chronic headaches 07/28/2015  . Chest pain 05/22/2015  . Urinary frequency 03/13/2015  . Easy bruising 10/09/2014  . Encounter for preventative adult health care exam with abnormal findings 01/11/2014  . Chronic constipation 01/11/2014   . GERD (gastroesophageal reflux disease) 01/11/2014  . Helicobacter positive gastritis 12/06/2013  . Dizziness and giddiness 10/12/2013  . Essential hypertension 09/19/2013  . Back pain 06/11/2013  . Lumbosacral spondylosis without myelopathy 02/20/2013  . Diabetic neuropathy ([place]) 01/09/2013  . DM (diabetes mellitus), type 2, uncontrolled ([place]) 10/02/2012  . MITRAL REGURGITATION, mild 10/31/2009  . PLEURAL EFFUSION, RIGHT after pyelonephritis in April 2011 (no intervention) 10/20/2009   PCP:  Myrtis Hopping., MD Pharmacy:   [facility] [phone] - [place], [address] AT [address] [place] [address] Phone: [phone] Fax: [phone]     Social Determinants of Health (SDOH) Interventions    Readmission Risk Interventions Readmission Risk Prevention Plan 06/03/2020  Transportation Screening Complete  PCP or Specialist Appt within 3-5 Days Complete  HRI or [place] Complete  Social Work Consult for [place] Planning/Counseling Complete  Palliative Care Screening Not Applicable  Medication Review Press photographer) Complete  Some recent data might be hidden

## 2020-10-06 NOTE — Progress Notes (Signed)
Patient catheter not performing optimally. Patient states catheter is positional has been been for a quite a while Flow  @ BFR of 150 most of treatment whether straight or reversed.

## 2020-10-06 NOTE — Plan of Care (Signed)

## 2020-10-06 NOTE — Care Management Important Message (Signed)
Important Message  Patient Details  Name: [name] [name] MRN: [id number] Date of Birth: 1965/12/15   Medicare Important Message Given:  Yes - Important Message mailed due to current National Emergency   Verbal consent obtained due to current National Emergency  Relationship to patient: Self Contact Name: [name] Call Date: 10/06/20  Time: 1453 Phone: [phone] Outcome: No Answer/Busy Important Message mailed to: Patient address on file    [name] 10/06/2020, 2:54 PM

## 2020-10-06 NOTE — NC FL2 (Signed)
[place] LEVEL OF CARE SCREENING TOOL     IDENTIFICATION  Patient Name: [name] [name] Birthdate: 1966/05/09 Sex: female Admission Date (Current Location): 10/02/2020  [facility] and [place] Number:  Herbalist and Address:  [facility] [place], [place] [address], [place], [place] [address]      Provider Number: [id number]  Attending Physician Name and Address:  Lucious Groves, DO  Relative Name and Phone Number:  Benjaman Lobe)   [phone] [facility] Phone)    Current Level of Care: Hospital Recommended Level of Care: [place] Prior Approval Number:    Date Approved/Denied:   PASRR Number: [id number]  Discharge Plan: SNF    Current Diagnoses: Patient Active Problem List   Diagnosis Date Noted  . Fracture of femoral neck, right ([place]) 10/02/2020  . Pressure injury of skin 03/12/2020  . Sepsis ([place]) 03/07/2020  . ESRD (end stage renal disease) ([place]) 03/07/2020  . Hyperkalemia 03/07/2020  . Acute cystitis 03/07/2020  . Acute metabolic encephalopathy [id number]  . 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 Diabetes mellitus with complication ([place])   . Intractable headache   . Syncope 07/28/2015  . Chronic headaches 07/28/2015  . Chest pain 05/22/2015  . Urinary frequency 03/13/2015  . Easy bruising 10/09/2014  . Encounter for preventative adult health care exam with abnormal findings 01/11/2014  . Chronic constipation 01/11/2014  . GERD (gastroesophageal reflux disease) 01/11/2014  . Helicobacter positive gastritis 12/06/2013  . Dizziness and giddiness 10/12/2013  . Essential hypertension 09/19/2013  . Back pain 06/11/2013  . Lumbosacral spondylosis without myelopathy 02/20/2013  . Diabetic neuropathy ([place]) 01/09/2013  . DM (diabetes mellitus), type 2, uncontrolled ([place]) 10/02/2012  . MITRAL REGURGITATION, mild 10/31/2009  . PLEURAL EFFUSION, RIGHT after pyelonephritis in April 2011 (no intervention)  10/20/2009    Orientation RESPIRATION BLADDER Height & Weight     Self,Time,Situation,Place  Normal Continent Weight: 133 lb 6.1 oz (60.5 kg) Height:  5' (152.4 cm)  BEHAVIORAL SYMPTOMS/MOOD NEUROLOGICAL BOWEL NUTRITION STATUS      Continent Diet (See d/c summary)  AMBULATORY STATUS COMMUNICATION OF NEEDS Skin   Extensive Assist Verbally Surgical wounds (Incision right hip)                       Personal Care Assistance Level of Assistance  Bathing,Dressing,Feeding Bathing Assistance: Maximum assistance Feeding assistance: Independent Dressing Assistance: Limited assistance     Functional Limitations Info  Sight,Hearing,Speech Sight Info: Adequate Hearing Info: Adequate Speech Info: Adequate    SPECIAL CARE FACTORS FREQUENCY  PT (By licensed PT),OT (By licensed OT)     PT Frequency: 5x/week OT Frequency: 5x/week            Contractures Contractures Info: Not present    Additional Factors Info  Code Status,Allergies Code Status Info: Full Allergies Info: Amitriptyline, Ciprofloxacin, Semaglutide,Tramadol, tylenol           Current Medications (10/06/2020):  This is the current hospital active medication list Current Facility-Administered Medications  Medication Dose Route Frequency Provider Last Rate Last Admin  . amLODipine (NORVASC) tablet 10 mg  10 mg Oral Daily Justin Mend, MD   10 mg at 10/06/20 0948  . calcium carbonate (TUMS - dosed in mg elemental calcium) chewable tablet 400 mg of elemental calcium  2 tablet Oral TID AC Justin Mend, MD   400 mg of elemental calcium at 10/05/20 1646  . carvedilol (COREG) tablet 25 mg  25 mg Oral BID Mitzi Hansen, MD   25 mg at 10/06/20 0948  . Chlorhexidine Gluconate Cloth 2 % PADS 6 each  6 each Topical Q0600 Ainsley Spinner, PA-C   6 each at 10/05/20 0630  . gabapentin (NEURONTIN) capsule 100 mg  100 mg Oral Daily PRN Alexandria Lodge, MD   100 mg at 10/04/20 1253  . gabapentin (NEURONTIN) capsule 200 mg   200 mg Oral QHS PRN Alexandria Lodge, MD      . heparin injection 5,000 Units  5,000 Units Subcutaneous Q8H Ainsley Spinner, PA-C   5,000 Units at 10/06/20 0548  . hydrALAZINE (APRESOLINE) tablet 50 mg  50 mg Oral Q8H Justin Mend, MD   50 mg at 10/06/20 0545  . insulin aspart (novoLOG) injection 0-9 Units  0-9 Units Subcutaneous TID WC Ainsley Spinner, PA-C   1 Units at 10/05/20 1111  . insulin glargine (LANTUS) injection 5 Units  5 Units Subcutaneous QHS Alexandria Lodge, MD   5 Units at 10/05/20 2239  . isosorbide dinitrate (ISORDIL) tablet 20 mg  20 mg Oral TID Mitzi Hansen, MD   20 mg at 10/06/20 1302  . lidocaine (LIDODERM) 5 % 1 patch  1 patch Transdermal Q24H Alexandria Lodge, MD   1 patch at 10/05/20 1315  . lidocaine (PF) (XYLOCAINE) 1 % injection 5 mL  5 mL Intradermal PRN Justin Mend, MD      . menthol-cetylpyridinium (CEPACOL) lozenge 3 mg  1 lozenge Oral PRN Ainsley Spinner, PA-C       Or  . phenol (CHLORASEPTIC) mouth spray 1 spray  1 spray Mouth/Throat PRN Ainsley Spinner, PA-C      . metoCLOPramide (REGLAN) tablet 5-10 mg  5-10 mg Oral Q8H PRN Ainsley Spinner, PA-C   5 mg at 10/05/20 1116   Or  . metoCLOPramide (REGLAN) injection 5-10 mg  5-10 mg Intravenous Q8H PRN Ainsley Spinner, PA-C   10 mg at 10/05/20 0347  . ondansetron (ZOFRAN) tablet 4 mg  4 mg Oral Q6H PRN Ainsley Spinner, PA-C   4 mg at 10/05/20 [id number]   Or  . ondansetron (ZOFRAN) injection 4 mg  4 mg Intravenous Q6H PRN Ainsley Spinner, PA-C   4 mg at 10/06/20 0556  . oxyCODONE (Oxy IR/ROXICODONE) immediate release tablet 5 mg  5 mg Oral Q4H PRN Alexandria Lodge, MD      . polyethylene glycol (MIRALAX / GLYCOLAX) packet 17 g  17 g Oral BID Mitzi Hansen, MD   17 g at 10/06/20 0949  . senna-docusate (Senokot-S) tablet 2 tablet  2 tablet Oral BID Mitzi Hansen, MD   2 tablet at 10/06/20 [id number]     Discharge Medications: Please see discharge summary for a list of discharge medications.  Relevant Imaging Results:  Relevant Lab  Results:   Additional Information [phone] MWF [facility] off [address]  Coralee Pesa, LCSWA

## 2020-10-07 ENCOUNTER — Inpatient Hospital Stay (HOSPITAL_COMMUNITY): Payer: Medicare Other

## 2020-10-07 DIAGNOSIS — N186 End stage renal disease: Secondary | ICD-10-CM | POA: Diagnosis not present

## 2020-10-07 DIAGNOSIS — I120 Hypertensive chronic kidney disease with stage 5 chronic kidney disease or end stage renal disease: Secondary | ICD-10-CM | POA: Diagnosis not present

## 2020-10-07 DIAGNOSIS — R509 Fever, unspecified: Secondary | ICD-10-CM | POA: Diagnosis not present

## 2020-10-07 DIAGNOSIS — E1122 Type 2 diabetes mellitus with diabetic chronic kidney disease: Secondary | ICD-10-CM | POA: Diagnosis not present

## 2020-10-07 DIAGNOSIS — S8012XA Contusion of left lower leg, initial encounter: Secondary | ICD-10-CM

## 2020-10-07 LAB — CBC WITH DIFFERENTIAL/PLATELET
Abs Immature Granulocytes: 0.01 10*3/uL (ref 0.00–0.07)
Basophils Absolute: 0 10*3/uL (ref 0.0–0.1)
Basophils Relative: 1 %
Eosinophils Absolute: 0.2 10*3/uL (ref 0.0–0.5)
Eosinophils Relative: 4 %
HCT: 28.7 % — ABNORMAL LOW (ref 36.0–46.0)
Hemoglobin: 9 g/dL — ABNORMAL LOW (ref 12.0–15.0)
Immature Granulocytes: 0 %
Lymphocytes Relative: 38 %
Lymphs Abs: 1.9 10*3/uL (ref 0.7–4.0)
MCH: 27.4 pg (ref 26.0–34.0)
MCHC: 31.4 g/dL (ref 30.0–36.0)
MCV: 87.5 fL (ref 80.0–100.0)
Monocytes Absolute: 0.8 10*3/uL (ref 0.1–1.0)
Monocytes Relative: 15 %
Neutro Abs: 2.1 10*3/uL (ref 1.7–7.7)
Neutrophils Relative %: 42 %
Platelets: 155 10*3/uL (ref 150–400)
RBC: 3.28 MIL/uL — ABNORMAL LOW (ref 3.87–5.11)
RDW: 17.8 % — ABNORMAL HIGH (ref 11.5–15.5)
WBC: 5.1 10*3/uL (ref 4.0–10.5)
nRBC: 0 % (ref 0.0–0.2)

## 2020-10-07 LAB — RENAL FUNCTION PANEL
Albumin: 2.6 g/dL — ABNORMAL LOW (ref 3.5–5.0)
Anion gap: 9 (ref 5–15)
BUN: 22 mg/dL — ABNORMAL HIGH (ref 6–20)
CO2: 27 mmol/L (ref 22–32)
Calcium: 8.3 mg/dL — ABNORMAL LOW (ref 8.9–10.3)
Chloride: 97 mmol/L — ABNORMAL LOW (ref 98–111)
Creatinine, Ser: 4.34 mg/dL — ABNORMAL HIGH (ref 0.44–1.00)
GFR, Estimated: 12 mL/min — ABNORMAL LOW (ref >60)
Glucose, Bld: 167 mg/dL — ABNORMAL HIGH (ref 70–99)
Phosphorus: 3.4 mg/dL (ref 2.5–4.6)
Potassium: 4.2 mmol/L (ref 3.5–5.1)
Sodium: 133 mmol/L — ABNORMAL LOW (ref 135–145)

## 2020-10-07 LAB — CULTURE, BLOOD (ROUTINE X 2)
Culture: NO GROWTH
Culture: NO GROWTH
Special Requests: ADEQUATE

## 2020-10-07 LAB — GLUCOSE, CAPILLARY
Glucose-Capillary: 193 mg/dL — ABNORMAL HIGH (ref 70–99)
Glucose-Capillary: 144 mg/dL — ABNORMAL HIGH (ref 70–99)
Glucose-Capillary: 134 mg/dL — ABNORMAL HIGH (ref 70–99)
Glucose-Capillary: 84 mg/dL (ref 70–99)

## 2020-10-07 MED ORDER — HYDROMORPHONE HCL 1 MG/ML IJ SOLN
1.0000 mg | Freq: Once | INTRAMUSCULAR | Status: AC | PRN
  Administered 2020-10-07: 1 mg via INTRAVENOUS
  Filled 2020-10-07: qty 1

## 2020-10-07 NOTE — Progress Notes (Signed)
Orthopedic Tech Progress Note Patient Details:  [name] 02-Apr-1966 [id number]  Ortho Devices Type of Ortho Device: Jari Pigg splint,Knee Immobilizer Ortho Device/Splint Location: Left Lower Extremity Ortho Device/Splint Interventions: Ordered,Application   Post Interventions Patient Tolerated: Well Instructions Provided: Care of device,Poper ambulation with device   Tammy Sours 10/07/2020, 9:17 PM

## 2020-10-07 NOTE — Progress Notes (Addendum)
Orthopaedic Progress note  Contacted by therapy today regarding L knee pain and instability  Imaging ordered and shows an extra-articular L proximal tibia fracture.   Very suspicious for stress reaction pattern with completion of fracture.  Uncertain as to when completion of fracture occurred.    Pt did not complain of any L knee pain pre-op for for the first several post op days   Will order CT scan  Would likely benefit from ORIF to prevent further migration of fracture  She will now be bed to chair transfers only as she is PWB on R leg and NWB on L leg   Knee immobilizer and bulky dressing to provide comfort  Jari Pigg, PA-C [phone] (C) 10/07/2020, 3:59 PM  [facility] [place] [address] [phone] Jenetta [phone] (F)     I have seen and examined the patient, I agree with the findings above by Ainsley Spinner, PA-C, and I have directed the plan for treatment as noted.  Rozanna Box, MD 10/09/2020 9:54 AM

## 2020-10-07 NOTE — Progress Notes (Addendum)
Physical Therapy Treatment Patient Details Name: [name] [name] MRN: [id number] DOB: 12-13-1965 Today's Date: 10/07/2020    History of Present Illness 55 year old female adm with  R femoral neck fx s/p precutaneoius pinnning on 3/31.  PMH: ESRD on HD, HTN, syncope and collapse, diabetic neuropathy, mitral regurgitation, impaired vision, HTN, heart murmur, colitis, and DM.    PT Comments    Pt supine in bed on arrival.  Upon visualization of B LEs presents with large bruise inferior to L knee with increased edema.  Pt reports MD told her it was just a bruise.  Performed B LE exercises and during lateral movement noticed play in the joint laterally that appeared abnormal.  Informed nursing and PA-C.  PTA terminated further tx.  PA-C ordered xray for L knee.    Of note: Pt presents with B foot drop in bed and would benefit from resting PRAFOs.   Pt also complains of increased pain in R great toe.    Follow Up Recommendations  SNF     Equipment Recommendations  None recommended by PT    Recommendations for Other Services       Precautions / Restrictions Precautions Precautions: Fall Precaution Comments: abnormal movement at L knee so deferred OOB at this time. Restrictions Weight Bearing Restrictions: Yes RLE Weight Bearing: Partial weight bearing RLE Partial Weight Bearing Percentage or Pounds: 50    Mobility  Bed Mobility               General bed mobility comments: Started to move OOB this session.  Pt started to move LLE lateral to edge of bed and presents with excessive lateral play in L knee that appears abnormal.  Returned back to bed and deffered OOB mobility.    Transfers                 General transfer comment: NT  Ambulation/Gait             General Gait Details: NT   Stairs             Wheelchair Mobility    Modified Rankin (Stroke Patients Only)       Balance Overall balance assessment: Needs assistance;History of  Falls Sitting-balance support: No upper extremity supported;Feet supported Sitting balance-Leahy Scale: Fair     Standing balance support: Bilateral upper extremity supported;During functional activity Standing balance-Leahy Scale: Poor                              Cognition Arousal/Alertness: Awake/alert Behavior During Therapy: WFL for tasks assessed/performed Overall Cognitive Status: Within Functional Limits for tasks assessed                                        Exercises General Exercises - Lower Extremity Ankle Circles/Pumps: AROM;Both;10 reps;Supine Quad Sets: AROM;Both;10 reps;Supine Heel Slides: AAROM;Both;10 reps;Supine Hip ABduction/ADduction: AAROM;Both;10 reps;Supine;Limitations Hip Abduction/Adduction Limitations: supported against gravity on L side do did not notice abnormal lateral play in lower leg/knee. Other Exercises Other Exercises: B HC stretches 3x 15 sec holds.    General Comments        Pertinent Vitals/Pain Pain Assessment: Faces Faces Pain Scale: Hurts even more Pain Location: L knee > R hip Pain Descriptors / Indicators: Discomfort;Grimacing;Guarding Pain Intervention(s): Monitored during session;Repositioned    Home Living  Prior Function            PT Goals (current goals can now be found in the care plan section) Acute Rehab PT Goals Patient Stated Goal: none stated Potential to Achieve Goals: Fair Progress towards PT goals: Progressing toward goals    Frequency    Min 3X/week      PT Plan Current plan remains appropriate    Co-evaluation              AM-PAC PT "6 Clicks" Mobility   Outcome Measure  Help needed turning from your back to your side while in a flat bed without using bedrails?: A Lot Help needed moving from lying on your back to sitting on the side of a flat bed without using bedrails?: A Lot Help needed moving to and from a bed to a chair  (including a wheelchair)?: A Lot Help needed standing up from a chair using your arms (e.g., wheelchair or bedside chair)?: A Lot Help needed to walk in hospital room?: Total Help needed climbing 3-5 steps with a railing? : Total 6 Click Score: 10    End of Session Equipment Utilized During Treatment: Gait belt Activity Tolerance: Patient limited by pain Patient left: with call bell/phone within reach;in bed;with bed alarm set Nurse Communication: Mobility status;Need for lift equipment PT Visit Diagnosis: Other abnormalities of gait and mobility (R26.89);History of falling (Z91.81);Pain Pain - Right/Left: Right Pain - part of body: Hip     Time: [id number] PT Time Calculation (min) (ACUTE ONLY): 27 min  Charges:  $Therapeutic Exercise: 8-22 mins $Therapeutic Activity: 8-22 mins                     Erasmo Leventhal , PTA Acute Rehabilitation Services Pager [phone] Office [phone]     Din Bookwalter Eli Hose 10/07/2020, 1:07 PM

## 2020-10-07 NOTE — Progress Notes (Addendum)
HD#5 Subjective:   No acute events overnight. During evaluation at bedside this morning, patient states her right hip pain is improving. Reports her left lower leg has been aching though feels the swelling is improved. She reports her appetite is improved. Denies vaginal burning or itching. Reports it has been "years" since she was last sexually active. Lives with her husband.   Objective:   Vital signs in last 24 hours: Vitals:   10/06/20 1729 10/06/20 1742 10/06/20 1843 10/06/20 2217  BP:  125/66 121/64 116/62  Pulse:  (!) 56 (!) 57 (!) 57  Resp:  '15 18 14  '$ Temp: 98.6 F (37 C) 98.6 F (37 C) 97.9 F (36.6 C) 98.7 F (37.1 C)  TempSrc:  Oral Oral Oral  SpO2: 96%  98% 99%  Weight:  61 kg    Height:       Physical Exam General: chronically ill appearing, in NAD Cardiac: RRR, no lower extremity edema Abdominal: soft, non-tender, non-distended Skin: right lateral hip surgical incision covered with a intact and clean dressing without surrounding erythema. LLE less swollen and warm to touch, though bruising seems to have extended more proximally to just above the knee, patient requests no palpation of area, DP pulses 2+ bilaterally. Sensation intact to light touch.  Pertinent Labs: CBC Latest Ref Rng & Units 10/07/2020 10/06/2020 10/05/2020  WBC 4.0 - 10.5 K/uL 5.1 6.1 7.8  Hemoglobin 12.0 - 15.0 g/dL 9.0(L) 9.3(L) 9.6(L)  Hematocrit 36.0 - 46.0 % 28.7(L) 29.8(L) 31.0(L)  Platelets 150 - 400 K/uL 155 165 145(L)    CMP Latest Ref Rng & Units 10/07/2020 10/06/2020 10/05/2020  Glucose 70 - 99 mg/dL 167(H) 103(H) 76  BUN 6 - 20 mg/dL 22(H) 46(H) 33(H)  Creatinine 0.44 - 1.00 mg/dL 4.34(H) 6.66(H) 5.38(H)  Sodium 135 - 145 mmol/L 133(L) 134(L) 136  Potassium 3.5 - 5.1 mmol/L 4.2 4.8 4.6  Chloride 98 - 111 mmol/L 97(L) 93(L) 98  CO2 22 - 32 mmol/L '27 27 24  '$ Calcium 8.9 - 10.3 mg/dL 8.3(L) 8.5(L) 8.6(L)  Total Protein 6.5 - 8.1 g/dL - - -  Total Bilirubin 0.3 - 1.2 mg/dL - - -   Alkaline Phos 38 - 126 U/L - - -  AST 15 - 41 U/L - - -  ALT 0 - 44 U/L - - -      Assessment/Plan:   Principal Problem:   Fracture of femoral neck, right (HCC) Active Problems:   ESRD (end stage renal disease) ([place])   Patient Summary:  [name] [name] is a 55 y.o. woman with history of ESRD on HD MWF, diabetes type 2, hypertension, NAFLD, GERD, history of recurrent UTIs, and chronic bilateral lower extremity weakness who presented after a fall and found to have a R femoral neck fracture. She underwent percutaneous pinning and is now awaiting placement in a skilled facility for ongoing rehabilitation.  This is hospital day 5.  Right femoral neck fracture s/p percutaneous pinning 3/31 (POD 5) Baseline diminished mobility Post operative anemia. Hgb slowly downtrending from admission, but not unexpected. Will continue to monitor. Post-operative nausea and appetite improving, but patient still has not had a BM.  Plan per orthopedics Mobility restrictions: PWB R leg, unrestricted ROM Therapy: PT and OT recommend SNF, patient states she would prefer HH.  Dressing changes prn Pain management: oxycodone 5 mg q4h prn, ice packs, lidocaine patch. Follow-up needs: suture removal ~4/14 - Miralax twice daily and Senokot-S to two tabs twice daily  Left  lower extremity bruising  Patient reports ongoing pain in left lower extremity. This afternoon, PT noticed increased lateral movement in L knee joint and informed ortho PA who ordered imaging of L knee.  - L knee imaging pending   Malodorous vaginal discharge Patient reports ongoing malodorous discharge without associated pain, burning, or itching. Patient reports she has not been sexually active in many years. - Vaginal wet prep swab pending  Fever (resolved): HDS, afebrile, Blood cultures NGTD  ESRD on HD MWF. Management per nephrology.   Type 2 diabetes mellitus. CBGs at goal. Continue lantus 5 QHS and SSI.  Uncontrolled  hypertension, improved Normotensive.  - continue amlodipine, hydralazine, imdur, coreg  VTE: Heparin Code: Full Dispo: PT/OT recommend SNF, patient prefers [facility]. Will continue to discuss with patient.  Please contact the on call pager after 5 pm and on weekends at [phone].  Alexandria Lodge, MD Internal Medicine Resident, PGY-1 [facility] Pager: [phone] 6:12 AM, 10/07/2020

## 2020-10-07 NOTE — Progress Notes (Signed)
Occupational Therapy Treatment Note  Pt seen as request by PT. Pt with increased complaints of pain and apparent instability with L knee, especially with attempts at mobility. Pt also developing L footdrop in addition to history of R footdrop (for @ 1 yr). Pt will benefit form B PRAFO splints to improve dorsiflexion at rest. Defer further mobility until LLE cleared. Increased time spent discussing discharge plan. Pt states she does not "really want to go to rehab". Once LLE cleared for mobility, discussed what pt would need to be able to do in order to DC home. At baseline, pt has a PCA who assists with bathing and dressing, however pt is able to ambulate short distances and is modified independent with functional transfers. Pt primarily uses her electric wc. Pt agreed that if she is not able to perform these tasks, that she would need post acute rehab. Will follow up after further imaging of LLE.    10/07/20 1306  OT Visit Information  Last OT Received On 10/07/20  Assistance Needed +2  History of Present Illness 55 year old female adm with  R femoral neck fx s/p precutaneoius pinnning on 3/31.  PMH: ESRD on HD, HTN, syncope and collapse, diabetic neuropathy, mitral regurgitation, impaired vision, HTN, heart murmur, colitis, and DM.  Precautions  Precautions Fall  Precaution Comments abnormal movement at L knee so deferred OOB at this time.  Pain Assessment  Pain Assessment Faces  Faces Pain Scale 6  Pain Location L knee > R hip  Pain Descriptors / Indicators Discomfort;Grimacing;Guarding  Pain Intervention(s) Limited activity within patient's tolerance;Repositioned  Cognition  Arousal/Alertness Awake/alert  Behavior During Therapy WFL for tasks assessed/performed  Overall Cognitive Status Within Functional Limits for tasks assessed  Upper Extremity Assessment  Upper Extremity Assessment Generalized weakness (numbness in B hands but uses functionally)  ADL  Overall ADL's  Needs  assistance/impaired  Grooming Set up;Sitting  Upper Body Bathing Set up;Sitting  Lower Body Bathing Maximal assistance;Bed level  Upper Body Dressing  Set up;Sitting  Lower Body Dressing Maximal assistance;Bed level  Toilet Transfer Details (indicate cue type and reason) unable to attempt this session  Functional mobility during ADLs  (deferred this date due to L knee pain)  Bed Mobility  Overal bed mobility Needs Assistance  General bed mobility comments Pt asssited with bed mobility with heavy dependence on rails  Restrictions  Weight Bearing Restrictions Yes  RLE Weight Bearing PWB  RLE Partial Weight Bearing Percentage or Pounds 50%  Transfers  General transfer comment not attempted this session  General Comments  General comments (skin integrity, edema, etc.) noted with B foot drop; recommend PRAFO boots  Other Exercises  Other Exercises B HC stretches 3x 15 sec holds.  OT - End of Session  Activity Tolerance Patient limited by pain;Other (comment) (limited by L knee pain and need to rule out further injury before progressing mobility)  Patient left in bed;with call bell/phone within reach;with bed alarm set;Other (comment) (modified chair position)  Nurse Communication Other (comment) (L knee pain; recommend PRAFOs)  OT Assessment/Plan  OT Plan Discharge plan remains appropriate  OT Visit Diagnosis Unsteadiness on feet (R26.81);Muscle weakness (generalized) (M62.81);Pain;History of falling (Z91.81)  Pain - Right/Left Left  Pain - part of body Knee;Leg  OT Frequency (ACUTE ONLY) Min 2X/week  Recommendations for Other Services PT consult  Follow Up Recommendations SNF  [facility] bed  AM-PAC OT "6 Clicks" Daily Activity Outcome Measure (Version 2)  Help from another person eating meals?  4  Help from another person taking care of personal grooming? 3  Help from another person toileting, which includes using toliet, bedpan, or urinal? 2  Help from another person  bathing (including washing, rinsing, drying)? 2  Help from another person to put on and taking off regular upper body clothing? 3  Help from another person to put on and taking off regular lower body clothing? 2  6 Click Score 16  OT Goal Progression  Progress towards OT goals Progressing toward goals  Acute Rehab OT Goals  Patient Stated Goal to eventually return home  OT Goal Formulation With patient  Time For Goal Achievement 10/18/20  Potential to Achieve Goals Good  ADL Goals  Pt Will Transfer to Toilet with +2 assist;with mod assist;stand pivot transfer;bedside commode  Additional ADL Goal #1 pt will tolerate eob sitting min (A) as precursro to adls  OT Time Calculation  OT Start Time (ACUTE ONLY) 1155  OT Stop Time (ACUTE ONLY) 1215  OT Time Calculation (min) 20 min  OT General Charges  $OT Visit 1 Visit  OT Treatments  $Self Care/Home Management  8-22 mins  Maurie Boettcher, OT/L   Acute OT Clinical Specialist [place] Pager [phone] Office [phone]

## 2020-10-07 NOTE — Discharge Summary (Signed)
Name: [name] [name] MRN: [id number] DOB: 26-Sep-1965 55 y.o. PCP: [name] [name]., MD  Date of Admission: 10/02/2020  5:24 AM Date of Discharge: 10/16/2020 Attending Physician: [name] Groves, DO  Discharge Diagnosis: 1. Right femoral neck fracture  2. Close fracture of left proximal tibia 3. End stage renal disease 4. Type 2 diabetes mellitus 5. Hypertension 6. Postoperative anemia  Discharge Medications: Allergies as of 10/16/2020      Reactions   Amitriptyline Swelling   Other reaction(s): Confusion (intolerance)   Ciprofloxacin Diarrhea, Nausea And Vomiting   VOMITING Severe vomiting requiring ED visit, IV reglan and IV fluids VOMITING   Semaglutide Nausea Only   Tramadol Itching   Tylenol [acetaminophen] Itching      Medication List    STOP taking these medications   amLODipine 10 MG tablet Commonly known as: NORVASC   carvedilol 25 MG tablet Commonly known as: COREG   hydrALAZINE 25 MG tablet Commonly known as: APRESOLINE   Insulin Glargine 300 UNIT/ML Sopn   insulin lispro 100 UNIT/ML injection Commonly known as: HUMALOG   isosorbide dinitrate 20 MG tablet Commonly known as: ISORDIL     TAKE these medications   atropine 1 % ophthalmic solution Place 1 drop into the right eye 3 (three) times daily.   B-complex with vitamin C tablet Take 1 tablet by mouth daily.   BISACODYL PO Take 5 mg by mouth daily as needed (constipation).   gabapentin 100 MG capsule Commonly known as: NEURONTIN Take 200 mg by mouth at bedtime as needed (pain).   gabapentin 100 MG capsule Commonly known as: NEURONTIN Take 100 mg by mouth daily as needed (pain).   LACTOBACILLUS EXTRA STRENGTH PO Take 1 capsule by mouth daily.   lidocaine 5 % Commonly known as: LIDODERM Place 1 patch onto the skin daily. Remove & Discard patch within 12 hours or as directed by MD   Magnesium 400 MG Tabs Take 400 mg by mouth 2 (two) times daily.   methocarbamol 500 MG  tablet Commonly known as: ROBAXIN TAKE 1 TABLET (500 MG TOTAL) BY MOUTH EVERY SIX HOURS AS NEEDED FOR MUSCLE SPASMS AND HEADACHE.   oxyCODONE 5 MG immediate release tablet Commonly known as: Oxy IR/ROXICODONE Take 0.5 tablets (2.5 mg total) by mouth 2 (two) times daily as needed for up to 7 days for moderate pain or severe pain. What changed:   how much to take  when to take this  reasons to take this   polyethylene glycol 17 g packet Commonly known as: MIRALAX / GLYCOLAX Take 17 g by mouth 2 (two) times daily for 7 days. What changed:   when to take this  reasons to take this   senna-docusate 8.6-50 MG tablet Commonly known as: Senokot-S Take 2 tablets by mouth 2 (two) times daily. What changed:   how much to take  when to take this  reasons to take this   SUMAtriptan 100 MG tablet Commonly known as: IMITREX TAKE 1 TABLET (100 MG TOTAL) BY MOUTH ONCE AS NEEDED FOR MIGRAINE. MAY REPEAT IN 2 HOURS IF HEADACHE PERSISTS OR RECURS.   vitamin C 100 MG tablet Take 100 mg by mouth daily.            Discharge Care Instructions  (From admission, onward)         Start     Ordered   10/16/20 0000  Discharge wound care:       Comments: See discharge instructions   10/16/20  1224          Disposition and follow-up:   [name] [name] was discharged from [facility] in Stable condition.  At the hospital follow up visit please address:  Right femoral neck fracture s/p percutaneous pinning on 10/02/20 Right proximal tibia and fibular fractures s/p ORIF 10/09/20 Postoperative anemia - Patient evaluated by PT/OT who recommended discharge to SNF for subacute rehab - Post operative anemia: discharge hemoglobin was 8.0 - Ensure patient follows up with orthopedic surgery - Check CBC  ESRD on HD MWF - Uses left subclavian HD line. Catheter was exchanged on 10/13/20.  - Last HD 10/15/20 - Check BMP  Constipation - patient discharged home with bowel  regimen  Type 2 diabetes mellitus - Insulin held at discharge as patient blood sugars were controlled  - Restart as needed  Hypertension - Patient's antihypertensives were held at discharge - Restart as necessary  Labs / imaging needed at time of follow-up: CBC, BMP  Pending labs/ test needing follow-up: None   Hospital Course:  [name] [name] is a 55 y.o. woman with history of ESRD on HD MWF,diabetes type 2, hypertension, NAFLD, GERD, history of recurrent UTIs, and chronic bilateral lower extremity weakness who presented after a fall and found to have a R femoral neck fracture. She underwent percutaneous pinning on 10/02/20. On 10/07/20 she was found to have a right tibia and fibular fracture and subsequently underwent an ORIF on 4/7. She was discharged on 10/16/20 to [facility] with plan to continue HD at [facility].  Right femoral neck fracture s/p percutaneous pinning 10/02/20 Fractured after ground-level fall during transfer to bed. No preceding loss of consciousness or presyncopal symptoms. Patient had fever on admission (see below). Taken for percutaneous pinning by orthopedic surgery on 10/02/20. Surgery without complications. Patient evaluated by PT/OT who recommended discharge to SNF for ongoing rehabilitation.   R proximal tibia and fibular fractures s/p ORIF 10/09/20 Discovered after patient developed pain, swelling, and bruising of left lower leg and PT noted increased lateral movement in the left knee joint. Lower extremity dopplers were negative for DVT. Patient taken for open reduction and internal fixation by orthopedic surgery on 10/09/20. Patient evaluated by PT/OT who recommended discharge to SNF for ongoing rehabilitation.   Fever of unknown origin (10/02/20 then 10/11/20) Patient without infectious symptoms on arrival. She was hemodynamically stable without leukocytosis or lactic acidosis. There was no concern for infection of her HD port. Patient was initially  placed on 2 L supplemental oxygen for reported desaturation to 86% however she was quickly weaned. Respiratory pathogen panel negative for COVID and Flu. MRSA nasal swab negative. 10/02/20 blood cultures no growth at 5 days. She subsequently re-fevered on 10/11/20 and blood cultures obtained in anticipation of HD catheter exchange on 10/13/20 were negative. She had repeat lower extremity dopplers to rule out DVT which were negative. She remained afebrile for the remainder of hospitalization.  ESRD on HD MWF Osteoporosis Managed by nephrology. Uses left IJ TDC for access. Vitamin D 25-OH 23.13. HD catheter exchanged 10/13/20 after difficulty using the line.  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 Hemoglobin 10.6 after 1st surgery. 2nd surgery pre-op hemoglobin 9.1, and post-op her hemoglobin ranged 7.7-9.7. No signs or symptoms of bleeding noted. Iron indices were checked and demonstrated ferritin 625, iron 38, TIBC 182, Tsat 21. Hemoglobin at discharge 8.0.  Delirium, multifactorial, resolved Patient had  waxing and waning alertness periodically. Suspect etiology multifactorial, including narcotic pain medications (patient with allergy to Tylenol; pain regimen weaned appropriately), uremia (ESRD, patient maintained on MWF HD schedule), constipation (treated with bowel regimen), prolonged hospitalization. Infectious work-up was unrevealing. Her mental status returned to baseline prior to discharge.   Constipation, resolved Patient without a bowel movement for the first 9 days of admission despite escalation of bowel regimen to include twice daily Miralax, Senokot-S, and bisacodyl suppository x 2. She subsequently had a bowel movement on 10/11/20 and then 10/15/20. She was discharged on a bowel regimen.  Type 2 diabetes mellitus Patient's home diabetes regimen is Lantus 13 u daily and insulin aspart 2-14 units with  meals at home. Hemoglobin A1c 6.5% on admission (10/02/20). Blood glucoses were initially controlled on combination lower dose Lantus and sliding scale insulin. However, ultimately her insulin was discontinued as her blood sugars were at goal and to avoid hypoglycemia.   Essential hypertension Patient is on amlodipine 10 mg daily, carvedilol 25 mg twice daily, hydralazine 25 mg three times daily, isosordl20 mg three times daily at home. Blood pressure on arrival was elevated around 192/93, however patient was in significant distress and pain from her left hip fracture. She was continued on her home antihypertensives initially, however they were gradually held as her BP ranged from normotensive to soft. Her BP were normotensive at discharge. Restart as needed.  Malodorous vaginal discharge On 10/06/20 vaginal swab was obtained due to nursing report of malodorous vaginal discharge. Patient was otherwise asymptomatic. Reported last intercourse >5 years prior. Swab was negative for gonorrhea, chlamydia, BV, trichomonas. The swab grew candida glabrata, which has low vaginal virulence. Given patient's lack of symptoms, treatment was not initiated.   ~~~~~~~~~~~~~~~~~~~~~~~~~~~~~~~~~~~~~~~~~~~~~~~~~~~~~~~~~~~~~~~~~~~~~~~~~~~~~~~~~  Day of Discharge Subjective:  No acute events overnight. This morning, patient reports some neck soreness which started overnight. Massage helps the pain. She reports her surgical site pain is well-controlled. Denies other discomfort. Discussed plan for discharge to SNF today.   Discharge Vitals:   BP (!) 137/57 (BP Location: Left Arm)   Pulse 66   Temp 98 F (36.7 C) (Oral)   Resp 17   Ht 5' (1.524 m)   Wt 65 kg   SpO2 94%   BMI 27.99 kg/m   General: chronically ill appearing, in NAD, sleeping initially however arouses to voice, alert Skin: no erythema or edema overlying the left subclavian line or right hip's surgical incision. Bilateral lower extremities in  stabilizing/pressure-offloading boots Neuro: oriented to person, place, time, and situation  Pertinent Labs, Studies, and Procedures:   10/02/2020 DG Chest 1 View  CLINICAL DATA:  55 year old female status post fall with right femoral neck fracture. EXAM: CHEST  1 VIEW COMPARISON:  Chest radiographs 08/13/2020 and earlier. FINDINGS: Portable AP supine view at 0627 hours. Lung volumes and mediastinal contours are stable and within normal limits. Visualized tracheal air column is within normal limits. Stable left chest dual lumen dialysis type catheter. There is streaky peribronchial opacity at the left lung base. Elsewhere when Allowing for portable technique the lungs are clear. No pneumothorax or pleural effusion. Negative visible bowel gas pattern. Stable visualized osseous structures. IMPRESSION: Streaky left lower lung opacity most resembling atelectasis. No other acute cardiopulmonary abnormality. Electronically Signed   By: Genevie Ann M.D.   On: 10/02/2020 06:42   10/02/2020 DG Ankle Complete Right  CLINICAL DATA:  55 year old female status post fall with pain. EXAM: RIGHT ANKLE - COMPLETE 3+ VIEW COMPARISON:  None. FINDINGS: Osteopenia.  There is no evidence of fracture, dislocation, or joint effusion. Degenerative spurring at the plantar calcaneus. No acute osseous abnormality identified. No discrete soft tissue injury. IMPRESSION: No acute fracture or dislocation identified about the right ankle. Electronically Signed   By: Genevie Ann M.D.   On: 10/02/2020 06:41   10/02/2020 DG C-Arm 1-60 Min  CLINICAL DATA:  Cannulated hip pinning. EXAM: DG C-ARM 1-60 MIN; OPERATIVE RIGHT HIP WITH PELVIS FLUOROSCOPY TIME:  Fluoroscopy Time:  47 seconds. Radiation Exposure Index (if provided by the fluoroscopic device): 7.4 mGy. Number of Acquired Spot Images: 7 COMPARISON:  October 02, 2020. FINDINGS: Seven C-arm fluoroscopic images were obtained intraoperatively and submitted for post operative interpretation. These  images demonstrate right hip pinning of a femoral neck fracture with 3 cannulated screws. Near anatomic alignment. Please see the performing provider's procedural report for further detail. IMPRESSION: Intraoperative fluoroscopy, as detailed above. Electronically Signed   By: Margaretha Sheffield MD   On: 10/02/2020 17:50   10/02/2020 DG HIP OPERATIVE UNILAT WITH PELVIS RIGHT  CLINICAL DATA:  Cannulated hip pinning. EXAM: DG C-ARM 1-60 MIN; OPERATIVE RIGHT HIP WITH PELVIS FLUOROSCOPY TIME:  Fluoroscopy Time:  47 seconds. Radiation Exposure Index (if provided by the fluoroscopic device): 7.4 mGy. Number of Acquired Spot Images: 7 COMPARISON:  October 02, 2020. FINDINGS: Seven C-arm fluoroscopic images were obtained intraoperatively and submitted for post operative interpretation. These images demonstrate right hip pinning of a femoral neck fracture with 3 cannulated screws. Near anatomic alignment. Please see the performing provider's procedural report for further detail. IMPRESSION: Intraoperative fluoroscopy, as detailed above. Electronically Signed   By: Margaretha Sheffield MD   On: 10/02/2020 17:50   10/02/2020 DG HIP UNILAT WITH PELVIS 2-3 VIEWS RIGHT  CLINICAL DATA:  Postoperative images. EXAM: DG HIP (WITH OR WITHOUT PELVIS) 2-3V RIGHT COMPARISON:  October 02, 2020 FINDINGS: Post right hip pinning with 3 cancellous screws across previously demonstrated femoral neck fracture. Anatomic alignment. Vascular calcifications are noted. IMPRESSION: Post right hip pinning with anatomic alignment. Electronically Signed   By: Fidela Salisbury M.D.   On: 10/02/2020 18:50   10/02/2020 DG Hip Unilat W or Wo Pelvis 2-3 Views Right  CLINICAL DATA:  55 year old female status post fall with right hip pain. EXAM: DG HIP (WITH OR WITHOUT PELVIS) 2-3V RIGHT COMPARISON:  CT Abdomen and Pelvis 03/06/2020. FINDINGS: Femoral heads normally located. Pelvis mildly rotated to the right. Chronic pubic symphysis degeneration. Pelvis  appears stable and intact. SI joints remain symmetric. Grossly intact proximal left femur. Subtle nondisplaced right femoral neck fracture (image 2). Intertrochanteric segment appears intact. Extensive calcified iliofemoral atherosclerosis. Negative lower abdominal and pelvic visceral contours. IMPRESSION: Positive for a nondisplaced fracture of the right femoral neck. Electronically Signed   By: Genevie Ann M.D.   On: 10/02/2020 06:40   10/07/2020 DG Foot Complete Right  FINDINGS: Age advanced osteoporosis and extensive small vessel calcifications. No acute fracture is identified. No destructive bony changes.  IMPRESSION: 1. Age advanced osteoporosis and extensive small vessel calcifications. 2. No acute bony findings.  Electronically Signed By: Marijo Sanes M.D. On: 10/07/2020 17:16  10/07/2020 DG Tibia/Fibula Left  FINDINGS: There are oblique coursing fractures involving the proximal tibial and fibular shafts. One cortex width of medial and anterior displacement of both bones.  No involvement of the articular surface. The femur is intact. Advanced vascular calcifications are noted.  IMPRESSION: Mildly displaced oblique coursing fractures involving the proximal tibial and fibular shafts.  Electronically Signed   By:  Marijo Sanes M.D.   On: 10/07/2020 17:13  10/07/2020 DG Knee 1-2 Views Left  FINDINGS: Oblique coursing fractures of the proximal tibia and fibular shafts. No intra-articular involvement is identified. Small knee joint effusion.  IMPRESSION: Oblique coursing fractures of the proximal tibia and fibular shafts.  Electronically Signed   By: Marijo Sanes M.D.   On: 10/07/2020 17:15  10/07/2020 CT knee left wo contrast  FINDINGS: Bones/Joint/Cartilage  There is a comminuted mildly displaced fracture seen through the proximal tibia. There definite intra-articular extension is noted. There is also comminuted mildly displaced proximal fibular fracture. The  articular surfaces appear to be maintained. 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 A small knee joint effusion is present.  Ligaments: Suboptimally assessed by CT.  Muscles and Tendons: The muscles surrounding the knee are intact without focal atrophy or tear. The patellar and quadriceps tendons are intact.  Soft tissues: Prepatellar subcutaneous edema is seen. Scattered dense vascular calcifications are noted.  IMPRESSION: Comminuted mildly displaced proximal tibia and fibular fractures. No definite intra-articular extension is seen.  Small knee joint effusion.  Electronically Signed   By: Prudencio Pair M.D.   On: 10/07/2020 22:31  10/09/2020 DG Knee Complete 4 Views Left  FINDINGS: Osseous demineralization.  Lateral plate and multiple screws placed across a reduced fracture of the tibial metaphysis.  Joint spaces preserved.  Oblique mildly displaced proximal LEFT fibular diaphyseal fracture noted.  IMPRESSION: Post ORIF of proximal LEFT tibial metaphyseal fracture.  Mildly displaced proximal LEFT fibular fracture.  Electronically Signed   By: Lavonia Dana M.D.   On: 10/09/2020 14:25  10/09/2020 DG C-Arm 1-60 Min  FINDINGS: Osseous demineralization.  Lateral plate and multiple screws placed across a reduced fracture of the tibial metaphysis.  Joint spaces preserved.  Oblique mildly displaced proximal LEFT fibular diaphyseal fracture noted.  IMPRESSION: Post ORIF of proximal LEFT tibial metaphyseal fracture.  Mildly displaced proximal LEFT fibular fracture.  Electronically Signed   By: Lavonia Dana M.D.   On: 10/09/2020 14:25  10/09/2020 DG knee left portable  FINDINGS: Lateral buttress plate and multiple screws at proximal LEFT tibia post ORIF of previously identified tibial metaphyseal fracture.  No dislocation.  Mildly displaced oblique fracture of the proximal LEFT  fibular diaphysis noted.  Knee joint spaces preserved.  Atherosclerotic calcifications of superficial femoral artery and popliteal artery into trifurcation vessels.  IMPRESSION: Post ORIF of proximal LEFT tibial metaphyseal fracture.  Mildly displaced oblique proximal LEFT fibular fracture.  Electronically Signed   By: Lavonia Dana M.D.   On: 10/09/2020 14:27  Discharge Instructions: Discharge Instructions    Call MD for:  difficulty breathing, headache or visual disturbances   Complete by: As directed    Call MD for:  extreme fatigue   Complete by: As directed    Call MD for:  persistant dizziness or light-headedness   Complete by: As directed    Call MD for:  persistant nausea and vomiting   Complete by: As directed    Call MD for:  redness, tenderness, or signs of infection (pain, swelling, redness, odor or green/yellow discharge around incision site)   Complete by: As directed    Call MD for:  severe uncontrolled pain   Complete by: As directed    Call MD for:  temperature >100.4   Complete by: As directed    Diet - low sodium heart healthy   Complete by: As directed    Discharge wound  care:   Complete by: As directed    See discharge instructions   Increase activity slowly   Complete by: As directed       Ms. Heling,  It was a pleasure taking care of you in the hospital. You were admitted with a fracture of your right hip after a fall while trying to get into bed. On admission, you also had a fever. You were taken for a procedure to repair the fracture. The procedure is called a percutaneous pinning, I.e., the orthopedic surgeon put a pin through the fracture to stabilize the hip. Later in your admission you had a stress fracture to your right lower leg which required repair by orthopedic surgery. We suspect this break occurred while bearing weight on the left leg following recovery from your first surgery. You were evaluated by physical and occupational therapy  who recommended discharge to a skilled nursing facility for further rehab and strengthening.   We stopped your blood pressure medications because your blood pressure was a bit low during this hospitalization. These medications may need to be added back as you follow-up with your doctor after discharge.  We also stopped your insulin because your blood sugars were controlled without. You will need to monitor your blood sugars and start back your insulin as directed by your doctor.  Take care!  Discharge instructions per orthopedic surgery:      Orthopaedic Trauma Service Discharge Instructions   General Discharge Instructions  Orthopaedic Injuries:  Right femoral neck fracture treated with percutaneous fixation using screws             Left proximal tibia fracture treated with open reduction internal fixation using plate and screws  WEIGHT BEARING STATUS: Weight-bear as tolerated right leg for transfers only, nonweightbearing left leg  RANGE OF MOTION/ACTIVITY: Unrestricted range of motion right hip and left knee  Bone health: Per nephrology  Wound Care: Daily wound care starting on arrival to nursing facility.  Daily dressing changes to right hip and left knee.  Can use Mepilex type dressing or 4 x 4 gauze and tape   Diet: as you were eating previously.  Can use over the counter stool softeners and bowel preparations, such as Miralax, to help with bowel movements.  Narcotics can be constipating.  Be sure to drink plenty of fluids  PAIN MEDICATION USE AND EXPECTATIONS  You have likely been given narcotic medications to help control your pain.  After a traumatic event that results in an fracture (broken bone) with or without surgery, it is ok to use narcotic pain medications to help control one's pain.  We understand that everyone responds to pain differently and each individual patient will be evaluated on a regular basis for the continued need for narcotic medications. Ideally,  narcotic medication use should last no more than 6-8 weeks (coinciding with fracture healing).   As a patient it is your responsibility as well to monitor narcotic medication use and report the amount and frequency you use these medications when you come to your office visit.   We would also advise that if you are using narcotic medications, you should take a dose prior to therapy to maximize you participation.  IF YOU ARE ON NARCOTIC MEDICATIONS IT IS NOT PERMISSIBLE TO OPERATE A MOTOR VEHICLE (MOTORCYCLE/CAR/TRUCK/MOPED) OR HEAVY MACHINERY DO NOT MIX NARCOTICS WITH OTHER CNS (CENTRAL NERVOUS SYSTEM) DEPRESSANTS SUCH AS ALCOHOL  STOP SMOKING OR USING NICOTINE PRODUCTS!!!!  As discussed nicotine severely impairs your body's ability to heal  surgical and traumatic wounds but also impairs bone healing.  Wounds and bone heal by forming microscopic blood vessels (angiogenesis) and nicotine is a vasoconstrictor (essentially, shrinks blood vessels).  Therefore, if vasoconstriction occurs to these microscopic blood vessels they essentially disappear and are unable to deliver necessary nutrients to the healing tissue.  This is one modifiable factor that you can do to dramatically increase your chances of healing your injury.    (This means no smoking, no nicotine gum, patches, etc)  DO NOT USE NONSTEROIDAL ANTI-INFLAMMATORY DRUGS (NSAID'S)  Using products such as Advil (ibuprofen), Aleve (naproxen), Motrin (ibuprofen) for additional pain control during fracture healing can delay and/or prevent the healing response.  If you would like to take over the counter (OTC) medication, Tylenol (acetaminophen) is ok.  However, some narcotic medications that are given for pain control contain acetaminophen as well. Therefore, you should not exceed more than 4000 mg of tylenol in a day if you do not have liver disease.  Also note that there are may OTC medicines, such as cold medicines and allergy medicines that my contain  tylenol as well.  If you have any questions about medications and/or interactions please ask your doctor/PA or your pharmacist.     ICE AND ELEVATE INJURED/OPERATIVE EXTREMITY  Using ice and elevating the injured extremity above your heart can help with swelling and pain control.  Icing in a pulsatile fashion, such as 20 minutes on and 20 minutes off, can be followed.    Do not place ice directly on skin. Make sure there is a barrier between to skin and the ice pack.    Using frozen items such as frozen peas works well as the conform nicely to the are that needs to be iced.  USE AN ACE WRAP OR TED HOSE FOR SWELLING CONTROL  In addition to icing and elevation, Ace wraps or TED hose are used to help limit and resolve swelling.  It is recommended to use Ace wraps or TED hose until you are informed to stop.    When using Ace Wraps start the wrapping distally (farthest away from the body) and wrap proximally (closer to the body)   Example: If you had surgery on your leg or thing and you do not have a splint on, start the ace wrap at the toes and work your way up to the thigh        If you had surgery on your upper extremity and do not have a splint on, start the ace wrap at your fingers and work your way up to the upper arm  IF YOU ARE IN A SPLINT OR CAST DO NOT [place]   If your splint gets wet for any reason please contact the office immediately. You may shower in your splint or cast as long as you keep it dry.  This can be done by wrapping in a cast cover or garbage back (or similar)  Do Not stick any thing down your splint or cast such as pencils, money, or hangers to try and scratch yourself with.  If you feel itchy take benadryl as prescribed on the bottle for itching  IF YOU ARE IN A CAM BOOT (BLACK BOOT)  You may remove boot periodically. Perform daily dressing changes as noted below.  Wash the liner of the boot regularly and wear a sock when wearing the boot. It is recommended  that you sleep in the boot until told otherwise  Call office for  the following:  Temperature greater than 101F  Persistent nausea and vomiting  Severe uncontrolled pain  Redness, tenderness, or signs of infection (pain, swelling, redness, odor or green/yellow discharge around the site)  Difficulty breathing, headache or visual disturbances  Hives  Persistent dizziness or light-headedness  Extreme fatigue  Any other questions or concerns you may have after discharge  In an emergency, call 911 or go to an Emergency Department at a nearby hospital  HELPFUL INFORMATION  ? If you had a block, it will wear off between 8-24 hrs postop typically.  This is period when your pain may go from nearly zero to the pain you would have had postop without the block.  This is an abrupt transition but nothing dangerous is happening.  You may take an extra dose of narcotic when this happens.  ? You should wean off your narcotic medicines as soon as you are able.  Most patients will be off or using minimal narcotics before their first postop appointment.   ? We suggest you use the pain medication the first night prior to going to bed, in order to ease any pain when the anesthesia wears off. You should avoid taking pain medications on an empty stomach as it will make you nauseous.  ? Do not drink alcoholic beverages or take illicit drugs when taking pain medications.  ? In most states it is against the law to drive while you are in a splint or sling.  And certainly against the law to drive while taking narcotics.  ? You may return to work/school in the next couple of days when you feel up to it.   ? Pain medication may make you constipated.  Below are a few solutions to try in this order: - Decrease the amount of pain medication if you aren't having pain. - Drink lots of decaffeinated fluids. - Drink prune juice and/or each dried prunes  o If the first 3 don't work start with additional  solutions - Take Colace - an over-the-counter stool softener - Take Senokot - an over-the-counter laxative - Take Miralax - a stronger over-the-counter laxative     CALL THE OFFICE WITH ANY QUESTIONS OR CONCERNS: [phone]   VISIT OUR WEBSITE FOR ADDITIONAL INFORMATION: [email or web address]    Signed:  Alexandria Lodge, MD Internal Medicine Resident, PGY-1 [facility] Pager: [phone] 12:24 PM, 10/16/2020

## 2020-10-07 NOTE — Progress Notes (Signed)
[facility] Progress Note   Subjective:   HD yesterday, had positional catheter issues.  SNF recommended, has declined and is requesting HH.  For possible d/c today.  Objective Vitals:   10/06/20 2217 10/07/20 0609 10/07/20 0612 10/07/20 0803  BP: 116/62 134/64 138/64 (!) 109/58  Pulse: (!) 57  (!) 58 (!) 59  Resp: '14  14 16  '$ Temp: 98.7 F (37.1 C)  98.6 F (37 C) 98.7 F (37.1 C)  TempSrc: Oral  Oral Oral  SpO2: 99%  97% 100%  Weight:      Height:       Physical Exam Gen: awake, alert.  Assisted off bedpan today HEENT: EOMI PERRL  CV:  RRR Lungs: clear ant Extr:  No edema Neuro:  nonfocal Skin: LIJ TDC c/d/i  Additional Objective Labs: Basic Metabolic Panel: Recent Labs  Lab 10/05/20 0239 10/06/20 0502 10/07/20 0313  NA 136 134* 133*  K 4.6 4.8 4.2  CL 98 93* 97*  CO2 '24 27 27  '$ GLUCOSE 76 103* 167*  BUN 33* 46* 22*  CREATININE 5.38* 6.66* 4.34*  CALCIUM 8.6* 8.5* 8.3*  PHOS 5.7* 5.8* 3.4   Liver Function Tests: Recent Labs  Lab 10/02/20 0603 10/05/20 0239 10/06/20 0502 10/07/20 0313  AST 23  --   --   --   ALT 17  --   --   --   ALKPHOS 83  --   --   --   BILITOT 1.3*  --   --   --   PROT 6.8  --   --   --   ALBUMIN 3.2* 2.9* 2.8* 2.6*   No results for input(s): LIPASE, AMYLASE in the last 168 hours. CBC: Recent Labs  Lab 10/02/20 0603 10/03/20 0327 10/04/20 0344 10/05/20 0239 10/06/20 0502 10/07/20 0313  WBC 8.5 15.3* 10.1 7.8 6.1 5.1  NEUTROABS 6.1  --   --   --  2.9 2.1  HGB 12.7 10.6* 9.8* 9.6* 9.3* 9.0*  HCT 41.0 33.7* 30.6* 31.0* 29.8* 28.7*  MCV 89.5 89.6 88.7 88.6 88.7 87.5  PLT 139* 138* 134* 145* 165 155   Blood Culture    Component Value Date/Time   SDES BLOOD RIGHT HAND 10/02/2020 0755   SPECREQUEST  10/02/2020 0755    BOTTLES DRAWN AEROBIC AND ANAEROBIC Blood Culture results may not be optimal due to an inadequate volume of blood received in culture bottles   CULT  10/02/2020 0755    NO GROWTH 5  DAYS Performed at [facility], [place] [address] [place], [place] [address]    REPTSTATUS 10/07/2020 FINAL 10/02/2020 0755    Cardiac Enzymes: No results for input(s): CKTOTAL, CKMB, CKMBINDEX, TROPONINI in the last 168 hours. CBG: Recent Labs  Lab 10/06/20 1142 10/06/20 1850 10/06/20 2042 10/07/20 0555 10/07/20 1202  GLUCAP 106* 116* 157* 193* 144*   Iron Studies:  Recent Labs    10/06/20 1843  IRON 57  TIBC 178*  FERRITIN 902*   '@lablastinr3'$ @ Studies/Results: VAS [place] LOWER EXTREMITY VENOUS (DVT)  Result Date: 10/06/2020  Lower Venous DVT Study Indications: Left lower extremity Pain, and ecchymosis.  Risk Factors: Status post right femoral neck fracture and pinning. Limitations: Patient could not tolerate compression in thigh or at popliteal fossa secondary to pain. Comparison Study: No prior study on file Performing Technologist: Sharion Dove RVS  Examination Guidelines: A complete evaluation includes B-mode imaging, spectral Doppler, color Doppler, and power Doppler as needed of all accessible portions of each  vessel. Bilateral testing is considered an integral part of a complete examination. Limited examinations for reoccurring indications may be performed as noted. The reflux portion of the exam is performed with the patient in reverse Trendelenburg.  +-----+---------------+---------+-----------+----------+--------------+ RIGHTCompressibilityPhasicitySpontaneityPropertiesThrombus Aging +-----+---------------+---------+-----------+----------+--------------+ CFV                 Yes      Yes                                 +-----+---------------+---------+-----------+----------+--------------+   +---------+---------------+---------+-----------+----------+-------------------+ LEFT     CompressibilityPhasicitySpontaneityPropertiesThrombus Aging      +---------+---------------+---------+-----------+----------+-------------------+ CFV                     Yes       Yes                  patent by color and                                                       Doppler             +---------+---------------+---------+-----------+----------+-------------------+ SFJ                                                   Not well visualized +---------+---------------+---------+-----------+----------+-------------------+ FV Prox                 Yes      Yes                  patent by color and                                                       Doppler             +---------+---------------+---------+-----------+----------+-------------------+ FV Mid                  Yes      Yes                  patent by color and                                                       Doppler             +---------+---------------+---------+-----------+----------+-------------------+ FV Distal               Yes      Yes                  patent by color and  Doppler             +---------+---------------+---------+-----------+----------+-------------------+ PFV                     Yes      Yes                  patent by color and                                                       Doppler             +---------+---------------+---------+-----------+----------+-------------------+ POP                     Yes      Yes                  patent by color and                                                       Doppler             +---------+---------------+---------+-----------+----------+-------------------+ PTV      Full                                                             +---------+---------------+---------+-----------+----------+-------------------+ PERO     Full                                                             +---------+---------------+---------+-----------+----------+-------------------+    Summary: RIGHT: - No evidence of  common femoral vein obstruction.  LEFT: - There is no evidence of deep vein thrombosis in the lower extremity. However, portions of this examination were limited- see technologist comments above.  *See table(s) above for measurements and observations. Electronically signed by Ruta Hinds MD on 10/06/2020 at 3:02:16 PM.    Final    Medications:  . amLODipine  10 mg Oral Daily  . calcium carbonate  2 tablet Oral TID AC  . carvedilol  25 mg Oral BID  . Chlorhexidine Gluconate Cloth  6 each Topical Q0600  . heparin injection (subcutaneous)  5,000 Units Subcutaneous Q8H  . hydrALAZINE  50 mg Oral Q8H  . insulin aspart  0-9 Units Subcutaneous TID WC  . insulin glargine  5 Units Subcutaneous QHS  . isosorbide dinitrate  20 mg Oral TID  . lidocaine  1 patch Transdermal Q24H  . polyethylene glycol  17 g Oral BID  . senna-docusate  2 tablet Oral BID   HD orders:  Triad 227mn BFR 350 DFR 600 EDW 62kg 2K/2.5Ca F180, heparin 5000 bolus and 500/hr maint  Assessment/Plan **Hip fracture: s/p operative management per ortho.  PT eval to determine dispo.  Please avoid morphine in this ESRD  patient.  No evidence of DVT although limited exam on 10/06/20  **ESRD on HD MWF:  s/p HD Friday per schedule.  Continue MWF schedule, last HD 4/4.  Will write orders for tomorrow in case she remains here  **BMM: clarified - she takes ca carbonate 2 TIDAC as binder, Phos - 6.5, resumed home binder; corr ca ok.   **DM: insulin per primary  **HTN: continue home meds; per outpt HD records frequent HTN.   **Anemia:  Hb 10.6 > 9.8 > 9.6. post op.  Trend. Check iron indices.   Madelon Lips MD 10/07/2020, 12:39 PM  [facility] Pager: [phone]

## 2020-10-08 DIAGNOSIS — R509 Fever, unspecified: Secondary | ICD-10-CM | POA: Diagnosis not present

## 2020-10-08 DIAGNOSIS — N898 Other specified noninflammatory disorders of vagina: Secondary | ICD-10-CM

## 2020-10-08 DIAGNOSIS — S82191A Other fracture of upper end of right tibia, initial encounter for closed fracture: Secondary | ICD-10-CM

## 2020-10-08 DIAGNOSIS — S82451A Displaced comminuted fracture of shaft of right fibula, initial encounter for closed fracture: Secondary | ICD-10-CM

## 2020-10-08 DIAGNOSIS — N186 End stage renal disease: Secondary | ICD-10-CM | POA: Diagnosis not present

## 2020-10-08 DIAGNOSIS — I120 Hypertensive chronic kidney disease with stage 5 chronic kidney disease or end stage renal disease: Secondary | ICD-10-CM | POA: Diagnosis not present

## 2020-10-08 DIAGNOSIS — E1122 Type 2 diabetes mellitus with diabetic chronic kidney disease: Secondary | ICD-10-CM | POA: Diagnosis not present

## 2020-10-08 LAB — RENAL FUNCTION PANEL
Albumin: 2.8 g/dL — ABNORMAL LOW (ref 3.5–5.0)
Anion gap: 11 (ref 5–15)
BUN: 38 mg/dL — ABNORMAL HIGH (ref 6–20)
CO2: 28 mmol/L (ref 22–32)
Calcium: 8.9 mg/dL (ref 8.9–10.3)
Chloride: 96 mmol/L — ABNORMAL LOW (ref 98–111)
Creatinine, Ser: 5.87 mg/dL — ABNORMAL HIGH (ref 0.44–1.00)
GFR, Estimated: 8 mL/min — ABNORMAL LOW (ref >60)
Glucose, Bld: 114 mg/dL — ABNORMAL HIGH (ref 70–99)
Phosphorus: 3.9 mg/dL (ref 2.5–4.6)
Potassium: 4.8 mmol/L (ref 3.5–5.1)
Sodium: 135 mmol/L (ref 135–145)

## 2020-10-08 LAB — CERVICOVAGINAL ANCILLARY ONLY
Bacterial Vaginitis (gardnerella): NEGATIVE
Candida Glabrata: POSITIVE — AB
Candida Vaginitis: NEGATIVE
Chlamydia: NEGATIVE
Comment: NEGATIVE
Comment: NEGATIVE
Comment: NEGATIVE
Comment: NEGATIVE
Comment: NEGATIVE
Comment: NORMAL
Neisseria Gonorrhea: NEGATIVE
Trichomonas: NEGATIVE

## 2020-10-08 LAB — GLUCOSE, CAPILLARY
Glucose-Capillary: 114 mg/dL — ABNORMAL HIGH (ref 70–99)
Glucose-Capillary: 149 mg/dL — ABNORMAL HIGH (ref 70–99)
Glucose-Capillary: 102 mg/dL — ABNORMAL HIGH (ref 70–99)

## 2020-10-08 LAB — CBC WITH DIFFERENTIAL/PLATELET
Abs Immature Granulocytes: 0.03 10*3/uL (ref 0.00–0.07)
Basophils Absolute: 0.1 10*3/uL (ref 0.0–0.1)
Basophils Relative: 1 %
Eosinophils Absolute: 0.2 10*3/uL (ref 0.0–0.5)
Eosinophils Relative: 3 %
HCT: 30.2 % — ABNORMAL LOW (ref 36.0–46.0)
Hemoglobin: 9.6 g/dL — ABNORMAL LOW (ref 12.0–15.0)
Immature Granulocytes: 1 %
Lymphocytes Relative: 44 %
Lymphs Abs: 2.8 10*3/uL (ref 0.7–4.0)
MCH: 28.1 pg (ref 26.0–34.0)
MCHC: 31.8 g/dL (ref 30.0–36.0)
MCV: 88.3 fL (ref 80.0–100.0)
Monocytes Absolute: 0.9 10*3/uL (ref 0.1–1.0)
Monocytes Relative: 15 %
Neutro Abs: 2.2 10*3/uL (ref 1.7–7.7)
Neutrophils Relative %: 36 %
Platelets: 195 10*3/uL (ref 150–400)
RBC: 3.42 MIL/uL — ABNORMAL LOW (ref 3.87–5.11)
RDW: 17.5 % — ABNORMAL HIGH (ref 11.5–15.5)
WBC: 6.2 10*3/uL (ref 4.0–10.5)
nRBC: 0 % (ref 0.0–0.2)

## 2020-10-08 MED ORDER — CEFAZOLIN SODIUM-DEXTROSE 1-4 GM/50ML-% IV SOLN
1.0000 g | Freq: Once | INTRAVENOUS | Status: AC
  Administered 2020-10-09: 1 g via INTRAVENOUS
  Filled 2020-10-08 (×2): qty 50

## 2020-10-08 MED ORDER — ONDANSETRON HCL 4 MG/2ML IJ SOLN
INTRAMUSCULAR | Status: AC
  Administered 2020-10-08: 4 mg via INTRAVENOUS
  Filled 2020-10-08: qty 2

## 2020-10-08 MED ORDER — HYDROMORPHONE HCL 1 MG/ML IJ SOLN
0.5000 mg | Freq: Once | INTRAMUSCULAR | Status: AC
  Administered 2020-10-08: 0.5 mg via INTRAVENOUS
  Filled 2020-10-08: qty 0.5

## 2020-10-08 MED ORDER — HEPARIN SODIUM (PORCINE) 1000 UNIT/ML IJ SOLN
INTRAMUSCULAR | Status: AC
  Filled 2020-10-08: qty 5

## 2020-10-08 MED ORDER — OXYCODONE HCL 5 MG PO TABS
5.0000 mg | ORAL_TABLET | Freq: Four times a day (QID) | ORAL | Status: DC | PRN
Start: 2020-10-08 — End: 2020-10-12
  Administered 2020-10-09 – 2020-10-11 (×6): 5 mg via ORAL
  Filled 2020-10-08 (×5): qty 1

## 2020-10-08 MED ORDER — OXYCODONE HCL 5 MG PO TABS
7.5000 mg | ORAL_TABLET | ORAL | Status: DC | PRN
Start: 2020-10-08 — End: 2020-10-13
  Administered 2020-10-08 – 2020-10-12 (×8): 7.5 mg via ORAL
  Administered 2020-10-12: 5 mg via ORAL
  Administered 2020-10-12 – 2020-10-13 (×2): 7.5 mg via ORAL
  Filled 2020-10-08 (×12): qty 2

## 2020-10-08 MED ORDER — BISACODYL 10 MG RE SUPP
10.0000 mg | Freq: Once | RECTAL | Status: AC
  Administered 2020-10-08: 10 mg via RECTAL
  Filled 2020-10-08: qty 1

## 2020-10-08 NOTE — TOC Progression Note (Signed)
Transition of Care [facility]) - Progression Note    Patient Details  Name: [name] [name] MRN: [id number] Date of Birth: June 28, 1966  [facility] CM/SW [place], [place] Phone Number: 10/08/2020, 3:20 PM  Clinical Narrative:     CSW met with pt again to discuss SNF vs HH. Pt to get another surgery and will likely be more limited with mobility. CSW explained financial situation again to pt. Explained that pt is in her medicare copay days but that her medicaid would pay the medicare copays. In order for pt's medicaid to be used, pt would also have to turn over her disability check. CSW explains that pt has over at [facility] and [facility]. Pt states she would prefer [facility] as they "weren't bad." Pt explains she does not want to turn over her check though. When CSW explains other option would be to return home with home health. Pt quietly states "I'll probably have to go." She expresses being undecided and needing time to think. TOC will continue to follow for d/c needs.   Expected Discharge Plan: Skilled Nursing Facility Barriers to Discharge: SNF Pending bed offer,Insurance Authorization,Continued Medical Work up  Expected Discharge Plan and Services Expected Discharge Plan: [place] Choice: [place] arrangements for the past 2 months: Single Family Home                                       Social Determinants of Health (SDOH) Interventions    Readmission Risk Interventions Readmission Risk Prevention Plan 06/03/2020  Transportation Screening Complete  PCP or Specialist Appt within 3-5 Days Complete  HRI or [place] Complete  Social Work Consult for [place] Planning/Counseling Complete  Palliative Care Screening Not Applicable  Medication Review Press photographer) Complete  Some recent data might be hidden

## 2020-10-08 NOTE — Progress Notes (Addendum)
FYI, patient has been NPO since midnight.  Correction:  Surgery is for 09/08/2020. Will resume Renal Diet.

## 2020-10-08 NOTE — Progress Notes (Signed)
[facility] Progress Note   Subjective:  HD today.  Reported knee pain, has L proximal tibia fracture.  For surgery tomorrow (ORIF)  Objective Vitals:   10/08/20 1000 10/08/20 1030 10/08/20 1035 10/08/20 1100  BP: 106/61 (!) 80/57 110/61 (!) 102/56  Pulse: (!) 50 (!) 52 (!) 52 (!) 52  Resp:      Temp:      TempSrc:      SpO2:      Weight:      Height:       Physical Exam Gen: sleepy HEENT: EOMI PERRL  CV:  RRR Lungs: clear ant Extr:  No edema Neuro:  nonfocal Skin: LIJ TDC c/d/i  Additional Objective Labs: Basic Metabolic Panel: Recent Labs  Lab 10/06/20 0502 10/07/20 0313 10/08/20 0542  NA 134* 133* 135  K 4.8 4.2 4.8  CL 93* 97* 96*  CO2 '27 27 28  '$ GLUCOSE 103* 167* 114*  BUN 46* 22* 38*  CREATININE 6.66* 4.34* 5.87*  CALCIUM 8.5* 8.3* 8.9  PHOS 5.8* 3.4 3.9   Liver Function Tests: Recent Labs  Lab 10/02/20 0603 10/05/20 0239 10/06/20 0502 10/07/20 0313 10/08/20 0542  AST 23  --   --   --   --   ALT 17  --   --   --   --   ALKPHOS 83  --   --   --   --   BILITOT 1.3*  --   --   --   --   PROT 6.8  --   --   --   --   ALBUMIN 3.2*   < > 2.8* 2.6* 2.8*   < > = values in this interval not displayed.   No results for input(s): LIPASE, AMYLASE in the last 168 hours. CBC: Recent Labs  Lab 10/04/20 0344 10/05/20 0239 10/06/20 0502 10/07/20 0313 10/08/20 0542  WBC 10.1 7.8 6.1 5.1 6.2  NEUTROABS  --   --  2.9 2.1 2.2  HGB 9.8* 9.6* 9.3* 9.0* 9.6*  HCT 30.6* 31.0* 29.8* 28.7* 30.2*  MCV 88.7 88.6 88.7 87.5 88.3  PLT 134* 145* 165 155 195   Blood Culture    Component Value Date/Time   SDES BLOOD RIGHT HAND 10/02/2020 0755   SPECREQUEST  10/02/2020 0755    BOTTLES DRAWN AEROBIC AND ANAEROBIC Blood Culture results may not be optimal due to an inadequate volume of blood received in culture bottles   CULT  10/02/2020 0755    NO GROWTH 5 DAYS Performed at [facility], [place] [address] [place], [place] [address]     REPTSTATUS 10/07/2020 FINAL 10/02/2020 0755    Cardiac Enzymes: No results for input(s): CKTOTAL, CKMB, CKMBINDEX, TROPONINI in the last 168 hours. CBG: Recent Labs  Lab 10/07/20 0555 10/07/20 1202 10/07/20 1641 10/07/20 2025 10/08/20 0603  GLUCAP 193* 144* 134* 84 114*   Iron Studies:  Recent Labs    10/06/20 1843  IRON 57  TIBC 178*  FERRITIN 902*   '@lablastinr3'$ @ Studies/Results: DG Knee 1-2 Views Left  Result Date: 10/07/2020 CLINICAL DATA:  Left knee pain. EXAM: LEFT KNEE - 1-2 VIEW COMPARISON:  Tib-fib films same date. FINDINGS: Oblique coursing fractures of the proximal tibia and fibular shafts. No intra-articular involvement is identified. Small knee joint effusion. IMPRESSION: Oblique coursing fractures of the proximal tibia and fibular shafts. Electronically Signed   By: Marijo Sanes M.D.   On: 10/07/2020 17:15   DG Tibia/Fibula Left  Result Date:  10/07/2020 CLINICAL DATA:  [place].  Left leg pain. EXAM: LEFT TIBIA AND FIBULA - 2 VIEW COMPARISON:  None. FINDINGS: There are oblique coursing fractures involving the proximal tibial and fibular shafts. One cortex width of medial and anterior displacement of both bones. No involvement of the articular surface. The femur is intact. Advanced vascular calcifications are noted. IMPRESSION: Mildly displaced oblique coursing fractures involving the proximal tibial and fibular shafts. Electronically Signed   By: Marijo Sanes M.D.   On: 10/07/2020 17:13   CT KNEE LEFT WO CONTRAST  Result Date: 10/07/2020 CLINICAL DATA:  Knee pain fracture EXAM: CT OF THE left KNEE WITHOUT CONTRAST TECHNIQUE: Multidetector CT imaging of the left knee was performed according to the standard protocol. Multiplanar CT image reconstructions were also generated. COMPARISON:  None. FINDINGS: Bones/Joint/Cartilage There is a comminuted mildly displaced fracture seen through the proximal tibia. There definite intra-articular extension is noted. There is also  comminuted mildly displaced proximal fibular fracture. The articular surfaces appear to be maintained. 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 A small knee joint effusion is present. Ligaments Suboptimally assessed by CT. Muscles and Tendons The muscles surrounding the knee are intact without focal atrophy or tear. The patellar and quadriceps tendons are intact. Soft tissues Prepatellar subcutaneous edema is seen. Scattered dense vascular calcifications are noted. IMPRESSION: Comminuted mildly displaced proximal tibia and fibular fractures. No definite intra-articular extension is seen. Small knee joint effusion. Electronically Signed   By: Prudencio Pair M.D.   On: 10/07/2020 22:31   DG Foot Complete Right  Result Date: 10/07/2020 CLINICAL DATA:  Right foot pain. EXAM: RIGHT FOOT COMPLETE - 3+ VIEW COMPARISON:  None. FINDINGS: Age advanced osteoporosis and extensive small vessel calcifications. No acute fracture is identified. No destructive bony changes. IMPRESSION: 1. Age advanced osteoporosis and extensive small vessel calcifications. 2. No acute bony findings. Electronically Signed   By: Marijo Sanes M.D.   On: 10/07/2020 17:16   Medications:  . amLODipine  10 mg Oral Daily  . calcium carbonate  2 tablet Oral TID AC  . carvedilol  25 mg Oral BID  . Chlorhexidine Gluconate Cloth  6 each Topical Q0600  . heparin injection (subcutaneous)  5,000 Units Subcutaneous Q8H  . heparin sodium (porcine)      . hydrALAZINE  50 mg Oral Q8H  . insulin aspart  0-9 Units Subcutaneous TID WC  . insulin glargine  5 Units Subcutaneous QHS  . isosorbide dinitrate  20 mg Oral TID  . lidocaine  1 patch Transdermal Q24H  . polyethylene glycol  17 g Oral BID  . senna-docusate  2 tablet Oral BID   HD orders:  Triad 212mn BFR 350 DFR 600 EDW 62kg 2K/2.5Ca F180, heparin 5000 bolus and 500/hr maint  Assessment/Plan **Hip fracture: s/p operative management  per ortho.  PT eval to determine dispo.  Please avoid morphine in this ESRD patient.  No evidence of DVT although limited exam on 10/06/20  ** L prox tibia fracture- for ORIF tomorrow 10/09/20.    **ESRD on HD MWF:  s/p HD Friday per schedule.  Continue MWF schedule  **BMM: clarified - she takes ca carbonate 2 TIDAC as binder, Phos - 6.5, resumed home binder; corr ca ok.   **DM: insulin per primary  **HTN: continue home meds; per outpt HD records frequent HTN.   **Anemia:  Hb 10.6 > 9.8 > 9.6. post op.  Trend. Check iron indices.   EMadelon Lips  MD 10/08/2020, 11:41 AM  [facility] Pager: [phone]

## 2020-10-08 NOTE — Plan of Care (Signed)
  Problem: Activity: Goal: Ability to ambulate and perform ADLs will improve Outcome: Not Progressing   Problem: Pain Management: Goal: Pain level will decrease Outcome: Progressing   

## 2020-10-08 NOTE — Progress Notes (Signed)
HD#6 Subjective:   No acute events overnight. She received a one-time dose of Dilaudid 1 mg IV ~10pm for left lower leg pain. Later in the morning ~6 am received an additional 0.5 mg IV Dilaudid. During evaluation at bedside during patient's dialysis session this morning, patient is drowsier. She had recently received additional PO pain medication (oxycodone 5 mg). Reports her pain is well-controlled. She expresses understanding of ortho's planned surgery tomorrow.  Objective:   Vital signs in last 24 hours: Vitals:   10/07/20 0612 10/07/20 0803 10/07/20 2024 10/08/20 0602  BP: 138/64 (!) 109/58 125/65 (!) 143/62  Pulse: (!) 58 (!) 59 60 (!) 57  Resp: '14 16 15 18  '$ Temp: 98.6 F (37 C) 98.7 F (37.1 C) 98.9 F (37.2 C) 98.6 F (37 C)  TempSrc: Oral Oral Oral Oral  SpO2: 97% 100% 100% 97%  Weight:      Height:       Physical Exam General: chronically-ill appearing, drowsier this morning, in NAD Cardiac: RRR, no lower extremity edema Abdominal: soft, non-distended Skin: right lateral hip surgical incision covered with a intact and clean dressing without surrounding erythema. LLE in knee immobilizer.   Pertinent Labs: CBC Latest Ref Rng & Units 10/08/2020 10/07/2020 10/06/2020  WBC 4.0 - 10.5 K/uL 6.2 5.1 6.1  Hemoglobin 12.0 - 15.0 g/dL 9.6(L) 9.0(L) 9.3(L)  Hematocrit 36.0 - 46.0 % 30.2(L) 28.7(L) 29.8(L)  Platelets 150 - 400 K/uL 195 155 165    CMP Latest Ref Rng & Units 10/08/2020 10/07/2020 10/06/2020  Glucose 70 - 99 mg/dL 114(H) 167(H) 103(H)  BUN 6 - 20 mg/dL 38(H) 22(H) 46(H)  Creatinine 0.44 - 1.00 mg/dL 5.87(H) 4.34(H) 6.66(H)  Sodium 135 - 145 mmol/L 135 133(L) 134(L)  Potassium 3.5 - 5.1 mmol/L 4.8 4.2 4.8  Chloride 98 - 111 mmol/L 96(L) 97(L) 93(L)  CO2 22 - 32 mmol/L '28 27 27  '$ Calcium 8.9 - 10.3 mg/dL 8.9 8.3(L) 8.5(L)  Total Protein 6.5 - 8.1 g/dL - - -  Total Bilirubin 0.3 - 1.2 mg/dL - - -  Alkaline Phos 38 - 126 U/L - - -  AST 15 - 41 U/L - - -  ALT 0 - 44  U/L - - -   DG Knee 1-2 Views Left  Result Date: 10/07/2020 CLINICAL DATA:  Left knee pain. EXAM: LEFT KNEE - 1-2 VIEW COMPARISON:  Tib-fib films same date. FINDINGS: Oblique coursing fractures of the proximal tibia and fibular shafts. No intra-articular involvement is identified. Small knee joint effusion. IMPRESSION: Oblique coursing fractures of the proximal tibia and fibular shafts. Electronically Signed   By: Marijo Sanes M.D.   On: 10/07/2020 17:15   DG Tibia/Fibula Left  Result Date: 10/07/2020 CLINICAL DATA:  [place].  Left leg pain. EXAM: LEFT TIBIA AND FIBULA - 2 VIEW COMPARISON:  None. FINDINGS: There are oblique coursing fractures involving the proximal tibial and fibular shafts. One cortex width of medial and anterior displacement of both bones. No involvement of the articular surface. The femur is intact. Advanced vascular calcifications are noted. IMPRESSION: Mildly displaced oblique coursing fractures involving the proximal tibial and fibular shafts. Electronically Signed   By: Marijo Sanes M.D.   On: 10/07/2020 17:13   CT KNEE LEFT WO CONTRAST  Result Date: 10/07/2020 CLINICAL DATA:  Knee pain fracture EXAM: CT OF THE left KNEE WITHOUT CONTRAST TECHNIQUE: Multidetector CT imaging of the left knee was performed according to the standard protocol. Multiplanar CT image reconstructions were also generated.  COMPARISON:  None. FINDINGS: Bones/Joint/Cartilage There is a comminuted mildly displaced fracture seen through the proximal tibia. There definite intra-articular extension is noted. There is also comminuted mildly displaced proximal fibular fracture. The articular surfaces appear to be maintained. 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 A small knee joint effusion is present. Ligaments Suboptimally assessed by CT. Muscles and Tendons The muscles surrounding the knee are intact without focal atrophy or tear. The patellar and  quadriceps tendons are intact. Soft tissues Prepatellar subcutaneous edema is seen. Scattered dense vascular calcifications are noted. IMPRESSION: Comminuted mildly displaced proximal tibia and fibular fractures. No definite intra-articular extension is seen. Small knee joint effusion. Electronically Signed   By: Prudencio Pair M.D.   On: 10/07/2020 22:31   DG Foot Complete Right  Result Date: 10/07/2020 CLINICAL DATA:  Right foot pain. EXAM: RIGHT FOOT COMPLETE - 3+ VIEW COMPARISON:  None. FINDINGS: Age advanced osteoporosis and extensive small vessel calcifications. No acute fracture is identified. No destructive bony changes. IMPRESSION: 1. Age advanced osteoporosis and extensive small vessel calcifications. 2. No acute bony findings. Electronically Signed   By: Marijo Sanes M.D.   On: 10/07/2020 17:16     Assessment/Plan:   Principal Problem:   Fracture of femoral neck, right (HCC) Active Problems:   ESRD (end stage renal disease) ([place])   Patient Summary:  [name] [name] is a 55 y.o. woman with history of ESRD on HD MWF, diabetes type 2, hypertension, NAFLD, GERD, history of recurrent UTIs, and chronic bilateral lower extremity weakness who presented after a fall and found to have a R femoral neck fracture. She underwent percutaneous pinning on 10/02/20. On 10/07/20 she was found to have a right tibia and fibular fracture.   This is hospital day 6.  Comminuted mildly displaced R proximal tibia and fibular fractures Yesterday afternoon, PT noticed increased lateral movement in L knee joint and informed ortho PA who ordered imaging of L knee which demonstrated the above. Suspect patient's drowsiness this morning secondary to pain medication. Will minimize IV Dilaudid. - Ortho plan for ORIF tomorrow, 4/7 - Restrictions as below - NPO at midnight - Pain management as below  Right femoral neck fracture s/p percutaneous pinning 3/31 (POD 6) Post operative anemia Hgb improved to 9.6 this  morning. Post-operative nausea and appetite improving. Plan per orthopedics Mobility restrictions: PWB R leg, unrestricted ROM; NWB on L leg Therapy: PT and OT recommend SNF, patient states she would prefer HH. Patient will require reevaluation after ORIF. Dressing changes prn Pain management: oxycodone 5 mg q4h prn, ice packs, lidocaine patch. Follow-up needs: suture removal for R hip ~4/14 - Miralax twice daily and Senokot-S to two tabs twice daily  Malodorous vaginal discharge Vaginal swab grew candida glabrata, which has low vaginal virulence. Given patient's lack of symptoms, no indication for treatment. If patient develops symptoms of vaginitis, would consider treatment with intravaginal boric acid.  Fever (resolved): HDS, afebrile, Blood cultures NGTD. ESRD on HD MWF. Management per nephrology. HD today. Type 2 diabetes mellitus. CBGs at goal. Continue lantus 5 QHS and SSI. Uncontrolled hypertension, improved Normotensive. Continue amlodipine, hydralazine, imdur, coreg.  VTE: Heparin Code: Full Dispo: PT/OT recommend SNF, patient prefers HH. Patient will need reevaluation after ORIF.  Please contact the on call pager after 5 pm and on weekends at [phone].  Alexandria Lodge, MD Internal Medicine Resident, PGY-1 [facility] Pager: [phone] 7:31 AM, 10/08/2020

## 2020-10-09 ENCOUNTER — Inpatient Hospital Stay (HOSPITAL_COMMUNITY): Payer: Medicare Other

## 2020-10-09 ENCOUNTER — Encounter (HOSPITAL_COMMUNITY): Payer: Self-pay | Admitting: Internal Medicine

## 2020-10-09 ENCOUNTER — Encounter (HOSPITAL_COMMUNITY)
Admission: EM | Disposition: A | Discharge status: Skilled Nursing Facility | Payer: Self-pay | Source: Home / Self Care | Attending: Internal Medicine

## 2020-10-09 DIAGNOSIS — N186 End stage renal disease: Secondary | ICD-10-CM | POA: Diagnosis not present

## 2020-10-09 DIAGNOSIS — E1122 Type 2 diabetes mellitus with diabetic chronic kidney disease: Secondary | ICD-10-CM | POA: Diagnosis not present

## 2020-10-09 DIAGNOSIS — R509 Fever, unspecified: Secondary | ICD-10-CM | POA: Diagnosis not present

## 2020-10-09 DIAGNOSIS — I120 Hypertensive chronic kidney disease with stage 5 chronic kidney disease or end stage renal disease: Secondary | ICD-10-CM | POA: Diagnosis not present

## 2020-10-09 DIAGNOSIS — R110 Nausea: Secondary | ICD-10-CM

## 2020-10-09 HISTORY — PX: ORIF TIBIA PLATEAU: SHX2132

## 2020-10-09 LAB — GLUCOSE, CAPILLARY
Glucose-Capillary: 100 mg/dL — ABNORMAL HIGH (ref 70–99)
Glucose-Capillary: 96 mg/dL (ref 70–99)
Glucose-Capillary: 81 mg/dL (ref 70–99)
Glucose-Capillary: 163 mg/dL — ABNORMAL HIGH (ref 70–99)
Glucose-Capillary: 190 mg/dL — ABNORMAL HIGH (ref 70–99)

## 2020-10-09 LAB — CBC WITH DIFFERENTIAL/PLATELET
Abs Immature Granulocytes: 0.05 10*3/uL (ref 0.00–0.07)
Basophils Absolute: 0.1 10*3/uL (ref 0.0–0.1)
Basophils Relative: 1 %
Eosinophils Absolute: 0.3 10*3/uL (ref 0.0–0.5)
Eosinophils Relative: 4 %
HCT: 29 % — ABNORMAL LOW (ref 36.0–46.0)
Hemoglobin: 9.1 g/dL — ABNORMAL LOW (ref 12.0–15.0)
Immature Granulocytes: 1 %
Lymphocytes Relative: 41 %
Lymphs Abs: 2.6 10*3/uL (ref 0.7–4.0)
MCH: 27.9 pg (ref 26.0–34.0)
MCHC: 31.4 g/dL (ref 30.0–36.0)
MCV: 89 fL (ref 80.0–100.0)
Monocytes Absolute: 0.9 10*3/uL (ref 0.1–1.0)
Monocytes Relative: 14 %
Neutro Abs: 2.5 10*3/uL (ref 1.7–7.7)
Neutrophils Relative %: 39 %
Platelets: 152 10*3/uL (ref 150–400)
RBC: 3.26 MIL/uL — ABNORMAL LOW (ref 3.87–5.11)
RDW: 17.5 % — ABNORMAL HIGH (ref 11.5–15.5)
WBC: 6.4 10*3/uL (ref 4.0–10.5)
nRBC: 0 % (ref 0.0–0.2)

## 2020-10-09 LAB — RENAL FUNCTION PANEL
Albumin: 2.6 g/dL — ABNORMAL LOW (ref 3.5–5.0)
Anion gap: 7 (ref 5–15)
BUN: 23 mg/dL — ABNORMAL HIGH (ref 6–20)
CO2: 27 mmol/L (ref 22–32)
Calcium: 8.6 mg/dL — ABNORMAL LOW (ref 8.9–10.3)
Chloride: 98 mmol/L (ref 98–111)
Creatinine, Ser: 3.98 mg/dL — ABNORMAL HIGH (ref 0.44–1.00)
GFR, Estimated: 13 mL/min — ABNORMAL LOW (ref >60)
Glucose, Bld: 93 mg/dL (ref 70–99)
Phosphorus: 3.7 mg/dL (ref 2.5–4.6)
Potassium: 4.2 mmol/L (ref 3.5–5.1)
Sodium: 132 mmol/L — ABNORMAL LOW (ref 135–145)

## 2020-10-09 SURGERY — OPEN REDUCTION INTERNAL FIXATION (ORIF) TIBIAL PLATEAU
Anesthesia: General | Site: Knee | Laterality: Left

## 2020-10-09 MED ORDER — LACTATED RINGERS IV SOLN: INTRAVENOUS | Status: DC

## 2020-10-09 MED ORDER — EPHEDRINE SULFATE-NACL 50-0.9 MG/10ML-% IV SOSY
PREFILLED_SYRINGE | INTRAVENOUS | Status: DC | PRN
  Administered 2020-10-09 (×2): 10 mg via INTRAVENOUS

## 2020-10-09 MED ORDER — OXYCODONE HCL 5 MG/5ML PO SOLN: 5.0000 mg | Freq: Once | ORAL | Status: DC | PRN

## 2020-10-09 MED ORDER — DEXAMETHASONE SODIUM PHOSPHATE 10 MG/ML IJ SOLN
INTRAMUSCULAR | Status: DC | PRN
  Administered 2020-10-09: 5 mg via INTRAVENOUS

## 2020-10-09 MED ORDER — ONDANSETRON HCL 4 MG/2ML IJ SOLN: 4.0000 mg | Freq: Once | INTRAMUSCULAR | Status: DC | PRN

## 2020-10-09 MED ORDER — CHLORHEXIDINE GLUCONATE 0.12 % MT SOLN
OROMUCOSAL | Status: AC
  Filled 2020-10-09: qty 15

## 2020-10-09 MED ORDER — PROPOFOL 10 MG/ML IV BOLUS
INTRAVENOUS | Status: DC | PRN
  Administered 2020-10-09: 150 mg via INTRAVENOUS

## 2020-10-09 MED ORDER — MIDAZOLAM HCL 5 MG/5ML IJ SOLN
INTRAMUSCULAR | Status: DC | PRN
  Administered 2020-10-09: 1 mg via INTRAVENOUS

## 2020-10-09 MED ORDER — PROPOFOL 10 MG/ML IV BOLUS
INTRAVENOUS | Status: AC
  Filled 2020-10-09: qty 20

## 2020-10-09 MED ORDER — CHLORHEXIDINE GLUCONATE CLOTH 2 % EX PADS
6.0000 | MEDICATED_PAD | Freq: Every day | CUTANEOUS | Status: DC
  Administered 2020-10-10 – 2020-10-15 (×5): 6 via TOPICAL

## 2020-10-09 MED ORDER — ONDANSETRON HCL 4 MG/2ML IJ SOLN
INTRAMUSCULAR | Status: AC
  Filled 2020-10-09: qty 2

## 2020-10-09 MED ORDER — OXYCODONE HCL 5 MG PO TABS: 5.0000 mg | ORAL_TABLET | Freq: Once | ORAL | Status: DC | PRN

## 2020-10-09 MED ORDER — MIDAZOLAM HCL 2 MG/2ML IJ SOLN
INTRAMUSCULAR | Status: AC
  Filled 2020-10-09: qty 2

## 2020-10-09 MED ORDER — 0.9 % SODIUM CHLORIDE (POUR BTL) OPTIME
TOPICAL | Status: DC | PRN
  Administered 2020-10-09: 1000 mL

## 2020-10-09 MED ORDER — LIDOCAINE 2% (20 MG/ML) 5 ML SYRINGE
INTRAMUSCULAR | Status: DC | PRN
  Administered 2020-10-09: 40 mg via INTRAVENOUS

## 2020-10-09 MED ORDER — FENTANYL CITRATE (PF) 100 MCG/2ML IJ SOLN
INTRAMUSCULAR | Status: AC
  Filled 2020-10-09: qty 2

## 2020-10-09 MED ORDER — FENTANYL CITRATE (PF) 250 MCG/5ML IJ SOLN
INTRAMUSCULAR | Status: AC
  Filled 2020-10-09: qty 5

## 2020-10-09 MED ORDER — SCOPOLAMINE 1 MG/3DAYS TD PT72
MEDICATED_PATCH | TRANSDERMAL | Status: AC
  Filled 2020-10-09: qty 1

## 2020-10-09 MED ORDER — DARBEPOETIN ALFA 150 MCG/0.3ML IJ SOSY
150.0000 ug | PREFILLED_SYRINGE | INTRAMUSCULAR | Status: DC
  Filled 2020-10-09: qty 0.3

## 2020-10-09 MED ORDER — LIDOCAINE 2% (20 MG/ML) 5 ML SYRINGE
INTRAMUSCULAR | Status: AC
  Filled 2020-10-09: qty 5

## 2020-10-09 MED ORDER — CHLORHEXIDINE GLUCONATE 0.12 % MT SOLN: 15.0000 mL | Freq: Once | OROMUCOSAL | Status: DC

## 2020-10-09 MED ORDER — FENTANYL CITRATE (PF) 100 MCG/2ML IJ SOLN
INTRAMUSCULAR | Status: DC | PRN
  Administered 2020-10-09 (×2): 50 ug via INTRAVENOUS

## 2020-10-09 MED ORDER — ORAL CARE MOUTH RINSE: 15.0000 mL | Freq: Once | OROMUCOSAL | Status: DC

## 2020-10-09 MED ORDER — CEFAZOLIN SODIUM-DEXTROSE 1-4 GM/50ML-% IV SOLN
1.0000 g | Freq: Once | INTRAVENOUS | Status: DC
  Filled 2020-10-09: qty 50

## 2020-10-09 MED ORDER — SCOPOLAMINE 1 MG/3DAYS TD PT72
MEDICATED_PATCH | TRANSDERMAL | Status: DC | PRN
  Administered 2020-10-09: 1 via TRANSDERMAL

## 2020-10-09 MED ORDER — ONDANSETRON HCL 4 MG/2ML IJ SOLN
INTRAMUSCULAR | Status: DC | PRN
  Administered 2020-10-09 (×2): 4 mg via INTRAVENOUS

## 2020-10-09 MED ORDER — SODIUM CHLORIDE 0.9 % IV SOLN: INTRAVENOUS | Status: DC

## 2020-10-09 MED ORDER — FENTANYL CITRATE (PF) 100 MCG/2ML IJ SOLN
25.0000 ug | INTRAMUSCULAR | Status: DC | PRN
  Administered 2020-10-09: 25 ug via INTRAVENOUS

## 2020-10-09 MED ORDER — KETAMINE HCL 50 MG/5ML IJ SOSY
PREFILLED_SYRINGE | INTRAMUSCULAR | Status: AC
  Filled 2020-10-09: qty 5

## 2020-10-09 MED ORDER — ALBUMIN HUMAN 5 % IV SOLN: INTRAVENOUS | Status: DC | PRN

## 2020-10-09 MED ORDER — SUCCINYLCHOLINE CHLORIDE 200 MG/10ML IV SOSY
PREFILLED_SYRINGE | INTRAVENOUS | Status: DC | PRN
  Administered 2020-10-09: 60 mg via INTRAVENOUS

## 2020-10-09 SURGICAL SUPPLY — 86 items
BANDAGE ESMARK 6X9 LF (GAUZE/BANDAGES/DRESSINGS) IMPLANT
BIT DRILL 3.3 LONG (BIT) ×2 IMPLANT
BIT DRILL QC 3.3X195 (BIT) ×2 IMPLANT
BLADE CLIPPER SURG (BLADE) IMPLANT
BLADE SURG 10 STRL SS (BLADE) IMPLANT
BLADE SURG 15 STRL LF DISP TIS (BLADE) IMPLANT
BLADE SURG 15 STRL SS (BLADE)
BNDG COHESIVE 4X5 TAN STRL (GAUZE/BANDAGES/DRESSINGS) ×2 IMPLANT
BNDG ELASTIC 4X5.8 VLCR NS LF (GAUZE/BANDAGES/DRESSINGS) ×2 IMPLANT
BNDG ELASTIC 4X5.8 VLCR STR LF (GAUZE/BANDAGES/DRESSINGS) ×2 IMPLANT
BNDG ELASTIC 6X5.8 VLCR STR LF (GAUZE/BANDAGES/DRESSINGS) ×2 IMPLANT
BNDG ESMARK 6X9 LF (GAUZE/BANDAGES/DRESSINGS)
BNDG GAUZE ELAST 4 BULKY (GAUZE/BANDAGES/DRESSINGS) ×2 IMPLANT
BRUSH SCRUB EZ PLAIN DRY (MISCELLANEOUS) ×4 IMPLANT
CANISTER SUCT 3000ML PPV (MISCELLANEOUS) ×2 IMPLANT
CAP LOCK NCB (Cap) ×6 IMPLANT
COVER SURGICAL LIGHT HANDLE (MISCELLANEOUS) ×2 IMPLANT
COVER WAND RF STERILE (DRAPES) IMPLANT
CUFF TOURN SGL QUICK 34 (TOURNIQUET CUFF)
CUFF TRNQT CYL 34X4.125X (TOURNIQUET CUFF) IMPLANT
DRAPE C-ARM 42X72 X-RAY (DRAPES) ×2 IMPLANT
DRAPE C-ARMOR (DRAPES) ×2 IMPLANT
DRAPE HALF SHEET 40X57 (DRAPES) IMPLANT
DRAPE INCISE IOBAN 66X45 STRL (DRAPES) ×2 IMPLANT
DRAPE U-SHAPE 47X51 STRL (DRAPES) ×2 IMPLANT
DRSG ADAPTIC 3X8 NADH LF (GAUZE/BANDAGES/DRESSINGS) ×2 IMPLANT
DRSG MEPITEL 4X7.2 (GAUZE/BANDAGES/DRESSINGS) ×2 IMPLANT
DRSG PAD ABDOMINAL 8X10 ST (GAUZE/BANDAGES/DRESSINGS) ×2 IMPLANT
ELECT REM PT RETURN 9FT ADLT (ELECTROSURGICAL) ×2
ELECTRODE REM PT RTRN 9FT ADLT (ELECTROSURGICAL) ×1 IMPLANT
GAUZE SPONGE 4X4 12PLY STRL (GAUZE/BANDAGES/DRESSINGS) ×2 IMPLANT
GLOVE BIO SURGEON STRL SZ7.5 (GLOVE) ×2 IMPLANT
GLOVE BIO SURGEON STRL SZ8 (GLOVE) ×2 IMPLANT
GLOVE BIOGEL PI IND STRL 7.5 (GLOVE) ×1 IMPLANT
GLOVE BIOGEL PI INDICATOR 7.5 (GLOVE) ×1
GLOVE SRG 8 PF TXTR STRL LF DI (GLOVE) ×1 IMPLANT
GLOVE SURG UNDER POLY LF SZ8 (GLOVE) ×1
GOWN STRL REUS W/ TWL LRG LVL3 (GOWN DISPOSABLE) ×2 IMPLANT
GOWN STRL REUS W/ TWL XL LVL3 (GOWN DISPOSABLE) ×1 IMPLANT
GOWN STRL REUS W/TWL LRG LVL3 (GOWN DISPOSABLE) ×2
GOWN STRL REUS W/TWL XL LVL3 (GOWN DISPOSABLE) ×1
IMMOBILIZER KNEE 22 UNIV (SOFTGOODS) ×2 IMPLANT
K-WIRE 2.0 (WIRE) ×2
K-WIRE FXSTD 280X2XNS SS (WIRE) ×2
KIT BASIN OR (CUSTOM PROCEDURE TRAY) ×2 IMPLANT
KIT TURNOVER KIT B (KITS) ×2 IMPLANT
KWIRE FXSTD 280X2XNS SS (WIRE) ×2 IMPLANT
NDL SUT 6 .5 CRC .975X.05 MAYO (NEEDLE) ×1 IMPLANT
NEEDLE MAYO TAPER (NEEDLE) ×1
NS IRRIG 1000ML POUR BTL (IV SOLUTION) ×2 IMPLANT
PACK ORTHO EXTREMITY (CUSTOM PROCEDURE TRAY) ×2 IMPLANT
PAD ARMBOARD 7.5X6 YLW CONV (MISCELLANEOUS) ×4 IMPLANT
PAD CAST 4YDX4 CTTN HI CHSV (CAST SUPPLIES) ×1 IMPLANT
PADDING CAST ABS 4INX4YD NS (CAST SUPPLIES) ×1
PADDING CAST ABS 6INX4YD NS (CAST SUPPLIES) ×1
PADDING CAST ABS COTTON 4X4 ST (CAST SUPPLIES) ×1 IMPLANT
PADDING CAST ABS COTTON 6X4 NS (CAST SUPPLIES) ×1 IMPLANT
PADDING CAST COTTON 4X4 STRL (CAST SUPPLIES) ×1
PADDING CAST COTTON 6X4 STRL (CAST SUPPLIES) ×2 IMPLANT
PLATE LAT PROX 3H TIB 5H LEFT (Miscellaneous) ×2 IMPLANT
SCREW HUM NCB PA ST 4X60 (Screw) ×2 IMPLANT
SCREW NCB 4.0 28MM (Screw) ×2 IMPLANT
SCREW NCB 4.0 32MM (Screw) ×2 IMPLANT
SCREW NCB 4.0MX30M (Screw) ×2 IMPLANT
SCREW NCB 4.0X26MM (Screw) ×2 IMPLANT
SCREW NCB 4.0X36MM (Screw) ×2 IMPLANT
SCREW NCB 4X3 4X70 (Screw) ×4 IMPLANT
SPONGE LAP 18X18 RF (DISPOSABLE) ×2 IMPLANT
STAPLER VISISTAT 35W (STAPLE) ×2 IMPLANT
STOCKINETTE IMPERVIOUS LG (DRAPES) ×2 IMPLANT
SUCTION FRAZIER HANDLE 10FR (MISCELLANEOUS) ×1
SUCTION TUBE FRAZIER 10FR DISP (MISCELLANEOUS) ×1 IMPLANT
SUT ETHILON 3 0 PS 1 (SUTURE) ×4 IMPLANT
SUT PROLENE 0 CT 2 (SUTURE) ×4 IMPLANT
SUT VIC AB 0 CT1 27 (SUTURE) ×1
SUT VIC AB 0 CT1 27XBRD ANBCTR (SUTURE) ×1 IMPLANT
SUT VIC AB 1 CT1 27 (SUTURE) ×1
SUT VIC AB 1 CT1 27XBRD ANBCTR (SUTURE) ×1 IMPLANT
SUT VIC AB 2-0 CT1 27 (SUTURE) ×2
SUT VIC AB 2-0 CT1 TAPERPNT 27 (SUTURE) ×2 IMPLANT
TOWEL GREEN STERILE (TOWEL DISPOSABLE) ×4 IMPLANT
TOWEL GREEN STERILE FF (TOWEL DISPOSABLE) ×2 IMPLANT
TRAY FOLEY MTR SLVR 16FR STAT (SET/KITS/TRAYS/PACK) IMPLANT
TUBE CONNECTING 12X1/4 (SUCTIONS) ×2 IMPLANT
WATER STERILE IRR 1000ML POUR (IV SOLUTION) IMPLANT
YANKAUER SUCT BULB TIP NO VENT (SUCTIONS) ×2 IMPLANT

## 2020-10-09 NOTE — Progress Notes (Signed)
HD#7 Subjective:   No acute events overnight.   Evaluated patient just prior to transport to the OR. Patient sitting up in bed, holding emesis bag. RN reports patient with onset of nausea just prior to transport to OR for surgery (ORIF). When asked how she is doing, patient nods. She endorses nausea however no other complaints.   Objective:   Vital signs in last 24 hours: Vitals:   10/08/20 1335 10/08/20 1407 10/08/20 2131 10/09/20 0334  BP: 120/65 126/62 (!) 129/57 140/65  Pulse: (!) 54 (!) 59 62 (!) 56  Resp: '18 17 18 16  '$ Temp: 97.9 F (36.6 C) 98.3 F (36.8 C) 99.6 F (37.6 C) 98.6 F (37 C)  TempSrc: Oral Oral Oral Oral  SpO2:  99% 99% 98%  Weight: 63 kg     Height:       Physical Exam General: chronically-ill appearing, holding emesis bag, in NAD Cardiac: no lower extremity edema Pulm: breathing comfortably on room air Abdominal: soft, non-distended Skin: right lateral hip surgical incision covered with a intact and clean dressing without surrounding erythema. LLE in knee immobilizer.   Pertinent Labs: CBC Latest Ref Rng & Units 10/08/2020 10/07/2020 10/06/2020  WBC 4.0 - 10.5 K/uL 6.2 5.1 6.1  Hemoglobin 12.0 - 15.0 g/dL 9.6(L) 9.0(L) 9.3(L)  Hematocrit 36.0 - 46.0 % 30.2(L) 28.7(L) 29.8(L)  Platelets 150 - 400 K/uL 195 155 165    CMP Latest Ref Rng & Units 10/08/2020 10/07/2020 10/06/2020  Glucose 70 - 99 mg/dL 114(H) 167(H) 103(H)  BUN 6 - 20 mg/dL 38(H) 22(H) 46(H)  Creatinine 0.44 - 1.00 mg/dL 5.87(H) 4.34(H) 6.66(H)  Sodium 135 - 145 mmol/L 135 133(L) 134(L)  Potassium 3.5 - 5.1 mmol/L 4.8 4.2 4.8  Chloride 98 - 111 mmol/L 96(L) 97(L) 93(L)  CO2 22 - 32 mmol/L '28 27 27  '$ Calcium 8.9 - 10.3 mg/dL 8.9 8.3(L) 8.5(L)  Total Protein 6.5 - 8.1 g/dL - - -  Total Bilirubin 0.3 - 1.2 mg/dL - - -  Alkaline Phos 38 - 126 U/L - - -  AST 15 - 41 U/L - - -  ALT 0 - 44 U/L - - -   No results found.   Assessment/Plan:   Principal Problem:   Fracture of femoral neck,  right (HCC) Active Problems:   ESRD (end stage renal disease) ([place])   Patient Summary:  [name] [name] is a 55 y.o. woman with history of ESRD on HD MWF, diabetes type 2, hypertension, NAFLD, GERD, history of recurrent UTIs, and chronic bilateral lower extremity weakness who presented after a fall and found to have a R femoral neck fracture. She underwent percutaneous pinning on 10/02/20. On 10/07/20 she was found to have a right tibia and fibular fracture.   This is hospital day 7.  Comminuted mildly displaced R proximal tibia and fibular fractures ORIF today.  - Restrictions as below - Pain management as below  Right femoral neck fracture s/p percutaneous pinning 3/31 (POD 7) Post operative anemia Hgb stable, 9.1 this morning pre-operatively.  Plan per orthopedics Mobility restrictions: PWB R leg, unrestricted ROM; NWB on L leg Therapy: PT and OT recommend SNF, patient states she would prefer HH. Patient will require reevaluation after ORIF. Dressing changes prn Pain management: oxycodone 7.5 mg q4h prn, oxy 5 mg q6h  ice packs, lidocaine patch. Follow-up needs: suture removal for R hip ~4/14 - Miralax twice daily and Senokot-S to two tabs twice daily; s/p bisacodyl suppository yesterday (4/6)  Malodorous vaginal discharge Vaginal swab grew candida glabrata. No treatment indicated at this time.  Fever (resolved): HDS, afebrile, Blood cultures NGTD. ESRD on HD MWF. Management per nephrology. Next HD tomorrow. Type 2 diabetes mellitus. CBGs at goal. Continue lantus 5 QHS and SSI. Uncontrolled hypertension, improved Normotensive. Continue amlodipine, hydralazine, imdur, coreg.  VTE: Heparin Code: Full Dispo: PT/OT recommend SNF, patient prefers HH. Patient will need reevaluation after ORIF.  Please contact the on call pager after 5 pm and on weekends at [phone].  Alexandria Lodge, MD Internal Medicine Resident, PGY-1 [facility] Pager:  [phone] 6:17 AM, 10/09/2020

## 2020-10-09 NOTE — Anesthesia Postprocedure Evaluation (Signed)
Anesthesia Post Note  Patient: [name] [name]  Procedure(s) Performed: OPEN REDUCTION INTERNAL FIXATION (ORIF) TIBIAL PLATEAU (Left Knee)     Patient location during evaluation: PACU Anesthesia Type: General Level of consciousness: awake and alert Pain management: pain level controlled Vital Signs Assessment: post-procedure vital signs reviewed and stable Respiratory status: spontaneous breathing, nonlabored ventilation and respiratory function stable Cardiovascular status: blood pressure returned to baseline and stable Postop Assessment: no apparent nausea or vomiting Anesthetic complications: no   No complications documented.  Last Vitals:  Vitals:   10/09/20 1245 10/09/20 1247  BP:  138/68  Pulse: (!) 54 (!) 53  Resp: 14 14  Temp:    SpO2: 96% 97%    Last Pain:  Vitals:   10/09/20 1247  TempSrc:   PainSc: Asleep                 Lidia Collum

## 2020-10-09 NOTE — Progress Notes (Signed)
Right hip feeling much better. C/o left knee pain that started with standing with PT two days ago. Denies any trauma.  I discussed with the patient the risks and benefits of surgery for left tibial plateau fracture repair, including the possibility of infection, nerve injury, vessel injury, wound breakdown, arthritis, symptomatic hardware, DVT/ PE, loss of motion, malunion, nonunion, and need for further surgery among others.  She acknowledged these risks and wished to proceed.  Altamese Gilbert, MD [facility], [facility] [phone]

## 2020-10-09 NOTE — Progress Notes (Signed)
PT Cancellation Note  Patient Details Name: [name] [name] MRN: [id number] DOB: 1966/04/15   Cancelled Treatment:    Reason Eval/Treat Not Completed: Patient at procedure or test/unavailable. Pt off floor having ORIF procedure. RN reporting pt was anxious and having bouts of emesis prior to surgery and likely will not be back in the room for several hours or be able to tolerate session later today. Will hold PT today and follow-up for planned re-eval tomorrow.   Moishe Spice, PT, DPT Acute Rehabilitation Services  Pager: [phone] Office: [place] 10/09/2020, 9:48 AM

## 2020-10-09 NOTE — Anesthesia Procedure Notes (Signed)
Procedure Name: Intubation Date/Time: 10/09/2020 10:33 AM Performed by: Gwyndolyn Saxon, CRNA Pre-anesthesia Checklist: Patient identified, Emergency Drugs available, Suction available and Patient being monitored Patient Re-evaluated:Patient Re-evaluated prior to induction Oxygen Delivery Method: Circle system utilized Preoxygenation: Pre-oxygenation with 100% oxygen Induction Type: IV induction, Rapid sequence and Cricoid Pressure applied Laryngoscope Size: Miller and 2 Grade View: Grade I Tube type: Oral Tube size: 6.5 mm Number of attempts: 1 Airway Equipment and Method: Patient positioned with wedge pillow and Stylet Placement Confirmation: ETT inserted through vocal cords under direct vision,  positive ETCO2 and breath sounds checked- equal and bilateral Secured at: 20 cm Tube secured with: Tape Dental Injury: Teeth and Oropharynx as per pre-operative assessment

## 2020-10-09 NOTE — Anesthesia Preprocedure Evaluation (Signed)
Anesthesia Evaluation  Patient identified by MRN, date of birth, ID band Patient awake    Reviewed: Allergy & Precautions, NPO status , Patient's Chart, lab work & pertinent test results  History of Anesthesia Complications Negative for: history of anesthetic complications  Airway Mallampati: II  TM Distance: >3 FB Neck ROM: Full    Dental  (+) Teeth Intact, Poor Dentition   Pulmonary neg pulmonary ROS,    Pulmonary exam normal        Cardiovascular hypertension, Normal cardiovascular exam     Neuro/Psych negative neurological ROS     GI/Hepatic Neg liver ROS, GERD  ,  Endo/Other  diabetes  Renal/GU DialysisRenal disease  negative genitourinary   Musculoskeletal negative musculoskeletal ROS (+)   Abdominal   Peds  Hematology  (+) anemia ,   Anesthesia Other Findings K 4.2, Hgb 9.1  Reproductive/Obstetrics                             Anesthesia Physical Anesthesia Plan  ASA: III  Anesthesia Plan: General   Post-op Pain Management:    Induction: Intravenous and Rapid sequence  PONV Risk Score and Plan: 3 and Ondansetron, Dexamethasone, Treatment may vary due to age or medical condition and Midazolam  Airway Management Planned: Oral ETT  Additional Equipment: None  Intra-op Plan:   Post-operative Plan: Extubation in OR  Informed Consent: I have reviewed the patients History and Physical, chart, labs and discussed the procedure including the risks, benefits and alternatives for the proposed anesthesia with the patient or authorized representative who has indicated his/her understanding and acceptance.     Dental advisory given  Plan Discussed with:   Anesthesia Plan Comments:         Anesthesia Quick Evaluation

## 2020-10-09 NOTE — Transfer of Care (Signed)
Immediate Anesthesia Transfer of Care Note  Patient: [name] [name]  Procedure(s) Performed: OPEN REDUCTION INTERNAL FIXATION (ORIF) TIBIAL PLATEAU (Left Knee)  Patient Location: PACU  Anesthesia Type:General  Level of Consciousness: drowsy and patient cooperative  Airway & Oxygen Therapy: Patient Spontanous Breathing  Post-op Assessment: Report given to RN and Post -op Vital signs reviewed and stable  Post vital signs: Reviewed and stable  Last Vitals:  Vitals Value Taken Time  BP 152/68 10/09/20 1217  Temp    Pulse 53 10/09/20 1220  Resp 16 10/09/20 1220  SpO2 100 % 10/09/20 1220  Vitals shown include unvalidated device data.  Last Pain:  Vitals:   10/09/20 0732  TempSrc:   PainSc: 6       Patients Stated Pain Goal: 3 ([id number])  Complications: No complications documented.

## 2020-10-09 NOTE — Progress Notes (Signed)
[facility] Progress Note   Subjective:  Seen s/p ORIF of tibial plateau fracture -  Resting.  HD yest - removed 2300- BP in the 90s during - post weight 63  Objective Vitals:   10/09/20 1232 10/09/20 1245 10/09/20 1247 10/09/20 1300  BP: (!) 152/68  138/68 (!) 162/72  Pulse: (!) 54 (!) 54 (!) 53 (!) 56  Resp: '14 14 14 16  '$ Temp:    97.7 F (36.5 C)  TempSrc:    Oral  SpO2: 97% 96% 97% 98%  Weight:      Height:       Physical Exam Gen: sleepy HEENT: EOMI PERRL  CV:  RRR Lungs: clear ant Extr:  No edema Neuro:  nonfocal Skin: LIJ TDC c/d/i  Additional Objective Labs: Basic Metabolic Panel: Recent Labs  Lab 10/07/20 0313 10/08/20 0542 10/09/20 0619  NA 133* 135 132*  K 4.2 4.8 4.2  CL 97* 96* 98  CO2 '27 28 27  '$ GLUCOSE 167* 114* 93  BUN 22* 38* 23*  CREATININE 4.34* 5.87* 3.98*  CALCIUM 8.3* 8.9 8.6*  PHOS 3.4 3.9 3.7   Liver Function Tests: Recent Labs  Lab 10/07/20 0313 10/08/20 0542 10/09/20 0619  ALBUMIN 2.6* 2.8* 2.6*   No results for input(s): LIPASE, AMYLASE in the last 168 hours. CBC: Recent Labs  Lab 10/05/20 0239 10/05/20 0239 10/06/20 0502 10/07/20 0313 10/08/20 0542 10/09/20 0619  WBC 7.8  --  6.1 5.1 6.2 6.4  NEUTROABS  --    < > 2.9 2.1 2.2 2.5  HGB 9.6*  --  9.3* 9.0* 9.6* 9.1*  HCT 31.0*  --  29.8* 28.7* 30.2* 29.0*  MCV 88.6  --  88.7 87.5 88.3 89.0  PLT 145*  --  165 155 195 152   < > = values in this interval not displayed.   Blood Culture    Component Value Date/Time   SDES BLOOD RIGHT HAND 10/02/2020 0755   SPECREQUEST  10/02/2020 0755    BOTTLES DRAWN AEROBIC AND ANAEROBIC Blood Culture results may not be optimal due to an inadequate volume of blood received in culture bottles   CULT  10/02/2020 0755    NO GROWTH 5 DAYS Performed at [facility], [place] [address], [place], [place] [address]    REPTSTATUS 10/07/2020 FINAL 10/02/2020 0755    Cardiac Enzymes: No results for input(s): CKTOTAL,  CKMB, CKMBINDEX, TROPONINI in the last 168 hours. CBG: Recent Labs  Lab 10/08/20 0603 10/08/20 1615 10/08/20 2130 10/09/20 0644 10/09/20 0841  GLUCAP 114* 149* 102* 100* 96   Iron Studies:  Recent Labs    10/06/20 1843  IRON 57  TIBC 178*  FERRITIN 902*   '@lablastinr3'$ @ Studies/Results: DG Knee 1-2 Views Left  Result Date: 10/07/2020 CLINICAL DATA:  Left knee pain. EXAM: LEFT KNEE - 1-2 VIEW COMPARISON:  Tib-fib films same date. FINDINGS: Oblique coursing fractures of the proximal tibia and fibular shafts. No intra-articular involvement is identified. Small knee joint effusion. IMPRESSION: Oblique coursing fractures of the proximal tibia and fibular shafts. Electronically Signed   By: Marijo Sanes M.D.   On: 10/07/2020 17:15   DG Tibia/Fibula Left  Result Date: 10/07/2020 CLINICAL DATA:  [place].  Left leg pain. EXAM: LEFT TIBIA AND FIBULA - 2 VIEW COMPARISON:  None. FINDINGS: There are oblique coursing fractures involving the proximal tibial and fibular shafts. One cortex width of medial and anterior displacement of both bones. No involvement of the articular surface. The femur is  intact. Advanced vascular calcifications are noted. IMPRESSION: Mildly displaced oblique coursing fractures involving the proximal tibial and fibular shafts. Electronically Signed   By: Marijo Sanes M.D.   On: 10/07/2020 17:13   CT KNEE LEFT WO CONTRAST  Result Date: 10/07/2020 CLINICAL DATA:  Knee pain fracture EXAM: CT OF THE left KNEE WITHOUT CONTRAST TECHNIQUE: Multidetector CT imaging of the left knee was performed according to the standard protocol. Multiplanar CT image reconstructions were also generated. COMPARISON:  None. FINDINGS: Bones/Joint/Cartilage There is a comminuted mildly displaced fracture seen through the proximal tibia. There definite intra-articular extension is noted. There is also comminuted mildly displaced proximal fibular fracture. The articular surfaces appear to be maintained.  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 A small knee joint effusion is present. Ligaments Suboptimally assessed by CT. Muscles and Tendons The muscles surrounding the knee are intact without focal atrophy or tear. The patellar and quadriceps tendons are intact. Soft tissues Prepatellar subcutaneous edema is seen. Scattered dense vascular calcifications are noted. IMPRESSION: Comminuted mildly displaced proximal tibia and fibular fractures. No definite intra-articular extension is seen. Small knee joint effusion. Electronically Signed   By: Prudencio Pair M.D.   On: 10/07/2020 22:31   DG Foot Complete Right  Result Date: 10/07/2020 CLINICAL DATA:  Right foot pain. EXAM: RIGHT FOOT COMPLETE - 3+ VIEW COMPARISON:  None. FINDINGS: Age advanced osteoporosis and extensive small vessel calcifications. No acute fracture is identified. No destructive bony changes. IMPRESSION: 1. Age advanced osteoporosis and extensive small vessel calcifications. 2. No acute bony findings. Electronically Signed   By: Marijo Sanes M.D.   On: 10/07/2020 17:16   Medications: . sodium chloride 10 mL/hr at 10/09/20 1018  . [START ON 10/10/2020]  ceFAZolin (ANCEF) IV     . amLODipine  10 mg Oral Daily  . calcium carbonate  2 tablet Oral TID AC  . carvedilol  25 mg Oral BID  . Chlorhexidine Gluconate Cloth  6 each Topical Q0600  . fentaNYL      . heparin injection (subcutaneous)  5,000 Units Subcutaneous Q8H  . hydrALAZINE  50 mg Oral Q8H  . insulin aspart  0-9 Units Subcutaneous TID WC  . insulin glargine  5 Units Subcutaneous QHS  . isosorbide dinitrate  20 mg Oral TID  . lidocaine  1 patch Transdermal Q24H  . polyethylene glycol  17 g Oral BID  . senna-docusate  2 tablet Oral BID   HD orders:   MWF [facility] 263mn BFR 350 DFR 600 EDW 62kg 2K/2.5Ca F180, heparin 5000 bolus and 500/hr maint  Assessment/Plan **Hip fracture: s/p operative management per ortho.  PT eval  to determine dispo.  Please avoid morphine in this ESRD patient.  No evidence of DVT although limited exam on 10/06/20  ** L prox tibia fracture- s/p ORIF 10/09/20.    **ESRD on HD MWF:  s/p HD Friday per schedule.  Continue MWF schedule- next due tomorrow via TDC  **BMM: clarified - she takes ca carbonate 2 TIDAC as binder, Phos - 6.5, resumed home binder; corr ca ok.   **DM: insulin per primary  **HTN: continue home meds; per outpt HD records frequent HTN.   **Anemia:  Hb 10.6 > 9.8 > 9.6>9.1. post op.  Trend. Check iron indices tomorrow with HD- will add ESA as well .   KLouis Meckel 10/09/2020, 2:19 PM  [facility] Pager: [phone]

## 2020-10-09 NOTE — Op Note (Signed)
10/09/2020 1:05 PM  PATIENT:  [name] [name]  55 y.o. female [id number]  PRE-OPERATIVE DIAGNOSIS:   LEFT BICONDYLAR TIBIAL PLATEAU FRACTURE  POST-OPERATIVE DIAGNOSIS:   LEFT BICONDYLAR TIBIAL PLATEAU FRACTURE  PROCEDURE:  Procedure(s): OPEN REDUCTION INTERNAL FIXATION (ORIF) BICONDYLAR TIBIAL PLATEAU (Left)  SURGEON:  Surgeon(s) and Role:    Altamese Christoval, MD - Primary  PHYSICIAN ASSISTANT: 1. KEITH PAUL, PA-C; 2. PA Student  ANESTHESIA:   general  EBL:  25 mL   BLOOD ADMINISTERED:none  DRAINS: none   LOCAL MEDICATIONS USED:  NONE  SPECIMEN: None  DISPOSITION OF SPECIMEN:  N/A  COUNTS:  YES  TOURNIQUET:  * No tourniquets in log *  DICTATION: Written below.  PLAN OF CARE: Admit to inpatient   PATIENT DISPOSITION:  PACU - hemodynamically stable.   Delay start of Pharmacological VTE agent (>24hrs) due to surgical blood loss or risk of bleeding: no     BRIEF SUMMARY AND INDICATION FOR PROCEDURE:  Patient is a 55 y.o.-year- old with a tibial plateau fracture, which either occurred or became symtpomatic during weight bearing with PT on that side while protecting cannulated screw fixation on the contralateral side of her hip. I discussed with the patient and his brother the risks and benefits of surgical treatment including the potential for arthritis, nerve injury, vessel injury, loss of motion, DVT, PE, heart attack, stroke, symptomatic hardware, need for further surgery, and multiple others.  The patient acknowledged these risks and wished to proceed.   BRIEF SUMMARY OF PROCEDURE:  After administration of preoperative antibiotics, the patient was taken to the operating room.  General anesthesia was induced and the lower extremity prepped and draped in usual sterile fashion using a chlorhexidine wash and betadine scrub and paint. A timeout was performed. I then brought in the radiolucent triangle. Using C-arm to identify the fracture site exit in the metaphysis, I  measured for the correct plate size and length. A curvilinear incision was made extending laterally over Gerdy's tubercle. Dissection was carried down where the soft tissues were left intact to the lateral plateau and rim, elevated only the insertion of the extensors. I was then able to insert the 5 hole Zimmer NCB tibial plateau plate and pin it proximally at the joint line. My assistant pulled traction and derotated the fracture while I placed a provisional screw to appose the plate to the bone and controlled alignment of the bicondylar plateau. Then three bicortical screws were placed distally. Three screws with locking caps were placed in the proximal row of the plate. Final images showed appropriate reduction, implant position and length. All wounds were irrigated thoroughly.   Once more, wound was irrigated and then a standard layered closure performed, 0 Vicryl, 2-0 Vicryl, and 3-0 nylon for the skin.  Sterile gently compressive dressing was applied and in knee immobilizer.  The patient was taken to the PACU in stable condition.    PROGNOSIS: The patient will not require formal bracing given the stable examination post fixation. Unrestricted range of motion will begin immediately.  Nonweightbearing on the left operative extremity, be on pharmacologic DVT prophylaxis, and mobilized with PT and OT. After discharge, we will plan to see her back in about 2 weeks for removal of sutures. Discharge anticipated in two days.         [name] [name]. Marcelino Scot, M.D.

## 2020-10-09 NOTE — Progress Notes (Signed)
Pharmacy Antibiotic Note  [name] [name] is a 55 y.o. female admitted on 10/02/2020 s/p fall, underwent ORIF bicondlar tibial plateau on 10/09/20.  Pharmacy has been consulted for Ancef dosing for surgical prophylaxis.  Patient received Ancef 1gm pre-op around 1030 today.  Patient has ESRD on MWF HD with plan for HD tomorrow 10/10/20; unsure of timing.  Plan: Ancef 1gm IV on 4/8 at 1100 Pharmacy will sign off  Height: 5' (152.4 cm) Weight: 63 kg (138 lb 14.2 oz) IBW/kg (Calculated) : 45.5  Temp (24hrs), Avg:98.5 F (36.9 C), Min:97.7 F (36.5 C), Max:99.6 F (37.6 C)  Recent Labs  Lab 10/05/20 0239 10/06/20 0502 10/07/20 0313 10/08/20 0542 10/09/20 0619  WBC 7.8 6.1 5.1 6.2 6.4  CREATININE 5.38* 6.66* 4.34* 5.87* 3.98*    Estimated Creatinine Clearance: 13.4 mL/min (A) (by C-G formula based on SCr of 3.98 mg/dL (H)).    Allergies  Allergen Reactions  . Amitriptyline Swelling    Other reaction(s): Confusion (intolerance)  . Ciprofloxacin Diarrhea and Nausea And Vomiting    VOMITING Severe vomiting requiring ED visit, IV reglan and IV fluids VOMITING   . Semaglutide Nausea Only  . Tramadol Itching  . Tylenol [Acetaminophen] Itching    [name] [name] D. Mina Marble, PharmD, BCPS, [place] 10/09/2020, 1:45 PM

## 2020-10-10 ENCOUNTER — Encounter (HOSPITAL_COMMUNITY): Payer: Self-pay | Admitting: Orthopedic Surgery

## 2020-10-10 DIAGNOSIS — R509 Fever, unspecified: Secondary | ICD-10-CM | POA: Diagnosis not present

## 2020-10-10 DIAGNOSIS — K5900 Constipation, unspecified: Secondary | ICD-10-CM

## 2020-10-10 DIAGNOSIS — S82102A Unspecified fracture of upper end of left tibia, initial encounter for closed fracture: Secondary | ICD-10-CM | POA: Clinically undetermined

## 2020-10-10 DIAGNOSIS — E1122 Type 2 diabetes mellitus with diabetic chronic kidney disease: Secondary | ICD-10-CM | POA: Diagnosis not present

## 2020-10-10 DIAGNOSIS — I120 Hypertensive chronic kidney disease with stage 5 chronic kidney disease or end stage renal disease: Secondary | ICD-10-CM | POA: Diagnosis not present

## 2020-10-10 DIAGNOSIS — N186 End stage renal disease: Secondary | ICD-10-CM | POA: Diagnosis not present

## 2020-10-10 LAB — IRON AND TIBC
Iron: 38 ug/dL (ref 28–170)
Saturation Ratios: 21 % (ref 10.4–31.8)
TIBC: 182 ug/dL — ABNORMAL LOW (ref 250–450)
UIBC: 144 ug/dL

## 2020-10-10 LAB — RENAL FUNCTION PANEL
Albumin: 2.8 g/dL — ABNORMAL LOW (ref 3.5–5.0)
Anion gap: 9 (ref 5–15)
BUN: 35 mg/dL — ABNORMAL HIGH (ref 6–20)
CO2: 27 mmol/L (ref 22–32)
Calcium: 8.8 mg/dL — ABNORMAL LOW (ref 8.9–10.3)
Chloride: 97 mmol/L — ABNORMAL LOW (ref 98–111)
Creatinine, Ser: 5.22 mg/dL — ABNORMAL HIGH (ref 0.44–1.00)
GFR, Estimated: 9 mL/min — ABNORMAL LOW (ref >60)
Glucose, Bld: 167 mg/dL — ABNORMAL HIGH (ref 70–99)
Phosphorus: 5 mg/dL — ABNORMAL HIGH (ref 2.5–4.6)
Potassium: 4.5 mmol/L (ref 3.5–5.1)
Sodium: 133 mmol/L — ABNORMAL LOW (ref 135–145)

## 2020-10-10 LAB — GLUCOSE, CAPILLARY
Glucose-Capillary: 137 mg/dL — ABNORMAL HIGH (ref 70–99)
Glucose-Capillary: 139 mg/dL — ABNORMAL HIGH (ref 70–99)
Glucose-Capillary: 144 mg/dL — ABNORMAL HIGH (ref 70–99)

## 2020-10-10 LAB — CBC
HCT: 31 % — ABNORMAL LOW (ref 36.0–46.0)
Hemoglobin: 9.7 g/dL — ABNORMAL LOW (ref 12.0–15.0)
MCH: 27.4 pg (ref 26.0–34.0)
MCHC: 31.3 g/dL (ref 30.0–36.0)
MCV: 87.6 fL (ref 80.0–100.0)
Platelets: 201 10*3/uL (ref 150–400)
RBC: 3.54 MIL/uL — ABNORMAL LOW (ref 3.87–5.11)
RDW: 17.4 % — ABNORMAL HIGH (ref 11.5–15.5)
WBC: 6.4 10*3/uL (ref 4.0–10.5)
nRBC: 0 % (ref 0.0–0.2)

## 2020-10-10 LAB — FERRITIN: Ferritin: 625 ng/mL — ABNORMAL HIGH (ref 11–307)

## 2020-10-10 MED ORDER — SODIUM CHLORIDE 0.9 % IV SOLN: 100.0000 mL | INTRAVENOUS | Status: DC | PRN

## 2020-10-10 MED ORDER — PENTAFLUOROPROP-TETRAFLUOROETH EX AERO: 1.0000 "application " | INHALATION_SPRAY | CUTANEOUS | Status: DC | PRN

## 2020-10-10 MED ORDER — LIDOCAINE HCL (PF) 1 % IJ SOLN
5.0000 mL | INTRAMUSCULAR | Status: DC | PRN
  Filled 2020-10-10: qty 5

## 2020-10-10 MED ORDER — LIDOCAINE-PRILOCAINE 2.5-2.5 % EX CREA
1.0000 "application " | TOPICAL_CREAM | CUTANEOUS | Status: DC | PRN
  Filled 2020-10-10: qty 5

## 2020-10-10 MED ORDER — HEPARIN SODIUM (PORCINE) 1000 UNIT/ML DIALYSIS
1000.0000 [IU] | INTRAMUSCULAR | Status: DC | PRN
  Filled 2020-10-10: qty 1

## 2020-10-10 MED ORDER — ALTEPLASE 2 MG IJ SOLR
2.0000 mg | Freq: Once | INTRAMUSCULAR | Status: AC | PRN
  Filled 2020-10-10: qty 2

## 2020-10-10 MED ORDER — BISACODYL 10 MG RE SUPP
10.0000 mg | Freq: Once | RECTAL | Status: AC
  Administered 2020-10-10: 10 mg via RECTAL
  Filled 2020-10-10 (×2): qty 1

## 2020-10-10 MED ORDER — ALTEPLASE 2 MG IJ SOLR
INTRAMUSCULAR | Status: AC
  Administered 2020-10-10: 2 mg
  Filled 2020-10-10: qty 4

## 2020-10-10 NOTE — Progress Notes (Signed)
Orthopaedic Trauma Service Progress Note  Patient ID: [name] [name] MRN: [id number] DOB/AGE: [birth year] 55 y.o.  Subjective:  In dialysis Doing ok  Left leg sore but not bad  Just had some pain meds prior to coming to dialysis and pain is controlled R hip doing well    ROS As above  Objective:   VITALS:   Vitals:   10/10/20 0849 10/10/20 0900 10/10/20 0930 10/10/20 1000  BP: 110/77 126/63 101/67 (!) 107/55  Pulse: 71 71 (!) 49 (!) 59  Resp: 16     Temp: 98.8 F (37.1 C)     TempSrc: Oral     SpO2: 98%     Weight: 64.6 kg     Height:        Estimated body mass index is 27.81 kg/m as calculated from the following:   Height as of this encounter: 5' (1.524 m).   Weight as of this encounter: 64.6 kg.   Intake/Output      04/07 0701 04/08 0700 04/08 0701 04/09 0700   P.O.  240   I.V. (mL/kg) 400 (6.3)    IV Piggyback 300    Total Intake(mL/kg) 700 (11.1) 240 (3.7)   Urine (mL/kg/hr) 0 (0) 0 (0)   Emesis/NG output     Other     Blood 25    Total Output 25 0   Net +675 +240          LABS  Results for orders placed or performed during the hospital encounter of 10/02/20 (from the past 24 hour(s))  Glucose, capillary     Status: None   Collection Time: 10/09/20 12:18 PM  Result Value Ref Range   Glucose-Capillary 81 70 - 99 mg/dL  Glucose, capillary     Status: Abnormal   Collection Time: 10/09/20  4:39 PM  Result Value Ref Range   Glucose-Capillary 163 (H) 70 - 99 mg/dL  Glucose, capillary     Status: Abnormal   Collection Time: 10/09/20  8:58 PM  Result Value Ref Range   Glucose-Capillary 190 (H) 70 - 99 mg/dL  Renal function panel     Status: Abnormal   Collection Time: 10/10/20  3:42 AM  Result Value Ref Range   Sodium 133 (L) 135 - 145 mmol/L   Potassium 4.5 3.5 - 5.1 mmol/L   Chloride 97 (L) 98 - 111 mmol/L   CO2 27 22 - 32 mmol/L   Glucose, Bld 167 (H) 70 - 99 mg/dL    BUN 35 (H) 6 - 20 mg/dL   Creatinine, Ser 5.22 (H) 0.44 - 1.00 mg/dL   Calcium 8.8 (L) 8.9 - 10.3 mg/dL   Phosphorus 5.0 (H) 2.5 - 4.6 mg/dL   Albumin 2.8 (L) 3.5 - 5.0 g/dL   GFR, Estimated 9 (L) >60 mL/min   Anion gap 9 5 - 15  Glucose, capillary     Status: Abnormal   Collection Time: 10/10/20  6:30 AM  Result Value Ref Range   Glucose-Capillary 137 (H) 70 - 99 mg/dL  CBC     Status: Abnormal   Collection Time: 10/10/20  6:51 AM  Result Value Ref Range   WBC 6.4 4.0 - 10.5 K/uL   RBC 3.54 (L) 3.87 - 5.11 MIL/uL   Hemoglobin 9.7 (L) 12.0 - 15.0 g/dL  HCT 31.0 (L) 36.0 - 46.0 %   MCV 87.6 80.0 - 100.0 fL   MCH 27.4 26.0 - 34.0 pg   MCHC 31.3 30.0 - 36.0 g/dL   RDW 17.4 (H) 11.5 - 15.5 %   Platelets 201 150 - 400 K/uL   nRBC 0.0 0.0 - 0.2 %  Iron and TIBC     Status: Abnormal   Collection Time: 10/10/20  6:51 AM  Result Value Ref Range   Iron 38 28 - 170 ug/dL   TIBC 182 (L) 250 - 450 ug/dL   Saturation Ratios 21 10.4 - 31.8 %   UIBC 144 ug/dL  Ferritin     Status: Abnormal   Collection Time: 10/10/20  6:51 AM  Result Value Ref Range   Ferritin 625 (H) 11 - 307 ng/mL     PHYSICAL EXAM:   Gen: NAD Ext:       Right Lower Extremity              Dressing c/d/i             Ext warm              Motor and sensory functions intact             No swelling of significance            Left Lower Extremity   Dressing c/d/i  Ext warm   + DP pulse  Distal motor and sensory functions at baseline  Intra-op exam notable for stable knee exam post op    Assessment/Plan: 1 Day Post-Op   Principal Problem:   Fracture of femoral neck, right (HCC) Active Problems:   ESRD (end stage renal disease) (HCC)   Anti-infectives (From admission, onward)   Start     Dose/Rate Route Frequency Ordered Stop   10/10/20 1100  ceFAZolin (ANCEF) IVPB 1 g/50 mL premix        1 g 100 mL/hr over 30 Minutes Intravenous  Once 10/09/20 1348     10/09/20 0900  ceFAZolin (ANCEF) IVPB 1 g/50 mL  premix        1 g 100 mL/hr over 30 Minutes Intravenous  Once 10/08/20 1450 10/09/20 1103   10/03/20 1700  ceFAZolin (ANCEF) IVPB 1 g/50 mL premix        1 g 100 mL/hr over 30 Minutes Intravenous Daily 10/02/20 1841 10/03/20 1648   10/03/20 0600  ceFAZolin (ANCEF) IVPB 2g/100 mL premix        2 g 200 mL/hr over 30 Minutes Intravenous On call to O.R. 10/02/20 1507 10/02/20 1639   10/02/20 1510  ceFAZolin (ANCEF) 2-4 GM/100ML-% IVPB       Note to Pharmacy: [place], [place]   : cabinet override      10/02/20 1510 10/02/20 1642    .  POD/HD#: 1  55 y/o female with numerous medical comorbidities with nondisplaced R femoral neck fracture s/p percutaneous pinning    -R femoral neck fracture s/p percutaneous pinning              WBAT for transfers only              Unrestricted ROM R hip and knee             PT/OT             Ice PRN              Dressing changes as needed  Dc sutures R hip around 10/16/2020     - L proximal tibia fracture s/p ORIF   Suspect pt had stress fracture that completed upon WB activity   NWB L leg  Unrestricted ROM L knee   No bracing   Ice and elevate for swelling and pain control     Dressing change tomorrow     - Pain management:             Multimodal             Minimize narcotics    - DVT/PE prophylaxis:             Heparin while inpt             If she goes to SNF would continue sq heparin x 21 days   - ID:              periop abx              Renally dosed    - Metabolic Bone Disease:             Per renal    - Impediments to fracture healing:             ESRD, metabolic bone disease, DM   - Dispo:             resume therapies             Ortho issues stable   TOC for SNF      Jari Pigg, PA-C [phone] (C) 10/10/2020, 10:28 AM  [facility] [place] [id number] [phone] Jenetta [phone] (F)    After 5pm and on the weekends please log on to Amion,  go to orthopaedics and the look under the [facility] Call for the provider(s) on call. You can also call our office at [phone] and then follow the prompts to be connected to the call team.

## 2020-10-10 NOTE — TOC Progression Note (Signed)
Transition of Care [facility]) - Progression Note    Patient Details  Name: [name] [name] MRN: [id number] Date of Birth: Mar 26, 1966  [facility]) CM/SW [place], [place] Phone Number: 10/10/2020, 4:07 PM  Clinical Narrative:     CSW met with pt who is now agreeable to SNF. She chooses [facility]. CSW explained pt would need to apply for [employer] for transportation to HD while at SNF. CSW will follow up with renal navigator to determine if HD clinic will need to change locations while she is at [facility].   Called [facility] who confirmed they could take pt.    Expected Discharge Plan: Skilled Nursing Facility Barriers to Discharge: SNF Pending bed offer,Insurance Authorization,Continued Medical Work up  Expected Discharge Plan and Services Expected Discharge Plan: [place] Choice: [place] arrangements for the past 2 months: Single Family Home                                       Social Determinants of Health (SDOH) Interventions    Readmission Risk Interventions Readmission Risk Prevention Plan 06/03/2020  Transportation Screening Complete  PCP or Specialist Appt within 3-5 Days Complete  [place]RI or [place] Complete  Social Work Consult for [place] Planning/Counseling Complete  Palliative Care Screening Not Applicable  Medication Review [employer]) Complete  Some recent data might be hidden

## 2020-10-10 NOTE — Discharge Instructions (Addendum)
[name],  [name],   It was a pleasure taking care of you in the hospital. You were admitted with a fracture of your right hip after a fall while trying to get into bed. On admission, you also had a fever. You were taken for a procedure to repair the fracture. The procedure is called a percutaneous pinning, I.e., the orthopedic surgeon put a pin through the fracture to stabilize the hip. Later in your admission you had a stress fracture to your right lower leg which required repair by orthopedic surgery. We suspect this break occurred while bearing weight on the left leg following recovery from your first surgery. You were evaluated by physical and occupational therapy who recommended discharge to a skilled nursing facility for further rehab and strengthening.   We stopped your blood pressure medications because your blood pressure was a bit low during this hospitalization. These medications may need to be added back as you follow-up with your doctor after discharge.  We also stopped your insulin because your blood sugars were controlled without. You will need to monitor your blood sugars and start back your insulin as directed by your doctor.  Take care!      Orthopaedic Trauma Service Discharge Instructions   General Discharge Instructions  Orthopaedic Injuries:  Right femoral neck fracture treated with percutaneous fixation using screws             Left proximal tibia fracture treated with open reduction internal fixation using plate and screws  WEIGHT BEARING STATUS: Weight-bear as tolerated right leg for transfers only, nonweightbearing left leg  RANGE OF MOTION/ACTIVITY: Unrestricted range of motion right hip and left knee  Bone health: Per nephrology  Wound Care: Daily wound care starting on arrival to nursing facility.  Daily dressing changes to right hip and left knee.  Can use Mepilex type dressing or 4 x 4 gauze and tape   Diet: as you were eating previously.  Can  use over the counter stool softeners and bowel preparations, such as Miralax, to help with bowel movements.  Narcotics can be constipating.  Be sure to drink plenty of fluids  PAIN MEDICATION USE AND EXPECTATIONS  You have likely been given narcotic medications to help control your pain.  After a traumatic event that results in an fracture (broken bone) with or without surgery, it is ok to use narcotic pain medications to help control one's pain.  We understand that everyone responds to pain differently and each individual patient will be evaluated on a regular basis for the continued need for narcotic medications. Ideally, narcotic medication use should last no more than 6-8 weeks (coinciding with fracture healing).   As a patient it is your responsibility as well to monitor narcotic medication use and report the amount and frequency you use these medications when you come to your office visit.   We would also advise that if you are using narcotic medications, you should take a dose prior to therapy to maximize you participation.  IF YOU ARE ON NARCOTIC MEDICATIONS IT IS NOT PERMISSIBLE TO OPERATE A MOTOR VEHICLE (MOTORCYCLE/CAR/TRUCK/MOPED) OR HEAVY MACHINERY DO NOT MIX NARCOTICS WITH OTHER CNS (CENTRAL NERVOUS SYSTEM) DEPRESSANTS SUCH AS ALCOHOL   STOP SMOKING OR USING NICOTINE PRODUCTS!!!!  As discussed nicotine severely impairs your body's ability to heal surgical and traumatic wounds but also impairs bone healing.  Wounds and bone heal by forming microscopic blood vessels (angiogenesis) and nicotine is a vasoconstrictor (essentially, shrinks blood vessels).  Therefore, if  vasoconstriction occurs to these microscopic blood vessels they essentially disappear and are unable to deliver necessary nutrients to the healing tissue.  This is one modifiable factor that you can do to dramatically increase your chances of healing your injury.    (This means no smoking, no nicotine gum, patches, etc)  DO NOT  USE NONSTEROIDAL ANTI-INFLAMMATORY DRUGS (NSAID'S)  Using products such as Advil (ibuprofen), Aleve (naproxen), Motrin (ibuprofen) for additional pain control during fracture healing can delay and/or prevent the healing response.  If you would like to take over the counter (OTC) medication, Tylenol (acetaminophen) is ok.  However, some narcotic medications that are given for pain control contain acetaminophen as well. Therefore, you should not exceed more than 4000 mg of tylenol in a day if you do not have liver disease.  Also note that there are may OTC medicines, such as cold medicines and allergy medicines that my contain tylenol as well.  If you have any questions about medications and/or interactions please ask your doctor/PA or your pharmacist.      ICE AND ELEVATE INJURED/OPERATIVE EXTREMITY  Using ice and elevating the injured extremity above your heart can help with swelling and pain control.  Icing in a pulsatile fashion, such as 20 minutes on and 20 minutes off, can be followed.    Do not place ice directly on skin. Make sure there is a barrier between to skin and the ice pack.    Using frozen items such as frozen peas works well as the conform nicely to the are that needs to be iced.  USE AN ACE WRAP OR TED HOSE FOR SWELLING CONTROL  In addition to icing and elevation, Ace wraps or TED hose are used to help limit and resolve swelling.  It is recommended to use Ace wraps or TED hose until you are informed to stop.    When using Ace Wraps start the wrapping distally (farthest away from the body) and wrap proximally (closer to the body)   Example: If you had surgery on your leg or thing and you do not have a splint on, start the ace wrap at the toes and work your way up to the thigh        If you had surgery on your upper extremity and do not have a splint on, start the ace wrap at your fingers and work your way up to the upper arm  IF YOU ARE IN A SPLINT OR CAST DO NOT [place]   If your splint gets wet for any reason please contact the office immediately. You may shower in your splint or cast as long as you keep it dry.  This can be done by wrapping in a cast cover or garbage back (or similar)  Do Not stick any thing down your splint or cast such as pencils, money, or hangers to try and scratch yourself with.  If you feel itchy take benadryl as prescribed on the bottle for itching  IF YOU ARE IN A CAM BOOT (BLACK BOOT)  You may remove boot periodically. Perform daily dressing changes as noted below.  Wash the liner of the boot regularly and wear a sock when wearing the boot. It is recommended that you sleep in the boot until told otherwise    Call office for the following:  Temperature greater than 101F  Persistent nausea and vomiting  Severe uncontrolled pain  Redness, tenderness, or signs of infection (pain, swelling, redness, odor or green/yellow  discharge around the site)  Difficulty breathing, headache or visual disturbances  Hives  Persistent dizziness or light-headedness  Extreme fatigue  Any other questions or concerns you may have after discharge  In an emergency, call 911 or go to an Emergency Department at a nearby hospital  HELPFUL INFORMATION  ? If you had a block, it will wear off between 8-24 hrs postop typically.  This is period when your pain may go from nearly zero to the pain you would have had postop without the block.  This is an abrupt transition but nothing dangerous is happening.  You may take an extra dose of narcotic when this happens.  ? You should wean off your narcotic medicines as soon as you are able.  Most patients will be off or using minimal narcotics before their first postop appointment.   ? We suggest you use the pain medication the first night prior to going to bed, in order to ease any pain when the anesthesia wears off. You should avoid taking pain medications on an empty stomach as it will make you  nauseous.  ? Do not drink alcoholic beverages or take illicit drugs when taking pain medications.  ? In most states it is against the law to drive while you are in a splint or sling.  And certainly against the law to drive while taking narcotics.  ? You may return to work/school in the next couple of days when you feel up to it.   ? Pain medication may make you constipated.  Below are a few solutions to try in this order: - Decrease the amount of pain medication if you aren't having pain. - Drink lots of decaffeinated fluids. - Drink prune juice and/or each dried prunes  o If the first 3 don't work start with additional solutions - Take Colace - an over-the-counter stool softener - Take Senokot - an over-the-counter laxative - Take Miralax - a stronger over-the-counter laxative     CALL THE OFFICE WITH ANY QUESTIONS OR CONCERNS: [phone]   VISIT OUR WEBSITE FOR ADDITIONAL INFORMATION: [email or web address]

## 2020-10-10 NOTE — Progress Notes (Signed)
Occupational Therapy Treatment Patient Details Name: [name] [name] MRN: [id number] DOB: Jan 25, 1966 Today's Date: 10/10/2020    History of present illness 55 year old female adm with  R femoral neck fx s/p precutaneoius pinnning on 3/31. Underwent OPEN REDUCTION INTERNAL FIXATION (ORIF) BICONDYLAR TIBIAL PLATEAU (Left) 10/09/2020.     PMH: ESRD on HD, HTN, syncope and collapse, diabetic neuropathy, mitral regurgitation, impaired vision, HTN, heart murmur, colitis, and DM.   OT comments  Pt seen after dialysis today. Clarified weight bearing status with pt (RLE WBAT for tranfers only and LLE NWB) and no need for KI, as pt was wanting to wear KI. Pt declined attempt for OOB to chair however discussed importance of working with OT/PT and progressing OOB to chair next sessions. Pt with B footdrop. B PRAFO splints ordered. Also educated pt on completing B heel cord stretches with gait belt. Will most likely benefit from leg lifter as pt has difficulty placing gait belt over feet. Initiated theraband strengthening ex for BUE. Current plan for SNF for rehab remains appropriate. Will continue to follow acutely.   Follow Up Recommendations  SNF    Equipment Recommendations  Hospital bed    Recommendations for Other Services      Precautions / Restrictions Precautions Precautions: Fall Required Braces or Orthoses: Other Brace (B PRAFO ordered) Restrictions RLE Weight Bearing: Weight bearing as tolerated (for transfers only) LLE Weight Bearing: Non weight bearing       Mobility Bed Mobility Overal bed mobility: Needs Assistance                  Transfers                 General transfer comment: not attempted this session; will need +2 assist    Balance                                           ADL either performed or assessed with clinical judgement   ADL       Grooming: Set up;Bed level   Upper Body Bathing: Set up;Bed level   Lower Body Bathing:  Maximal assistance;Bed level                         General ADL Comments: used gait belt as leg lifter for pt to begin moving her legs during functional tasks     Vision       Perception     Praxis      Cognition Arousal/Alertness: Awake/alert Behavior During Therapy: WFL for tasks assessed/performed Overall Cognitive Status: No family/caregiver present to determine baseline cognitive functioning                                 General Comments: most likely baseline        Exercises Exercises: Other exercises Other Exercises Other Exercises: B HC stretches 3x 15 sec holds. Other Exercises: initiated BUE theraband strengthening with level 1 therabnad. focus on tricep and back strengthening   Shoulder Instructions       General Comments      Pertinent Vitals/ Pain       Pain Assessment: Faces Faces Pain Scale: Hurts even more Pain Location: L knee > R hip Pain Descriptors / Indicators: Discomfort;Grimacing;Guarding Pain Intervention(s): Limited activity  within patient's tolerance;Repositioned  Home Living                                          Prior Functioning/Environment              Frequency  Min 2X/week        Progress Toward Goals  OT Goals(current goals can now be found in the care plan section)  Progress towards OT goals: Progressing toward goals  Acute Rehab OT Goals Patient Stated Goal: to eventually return home OT Goal Formulation: With patient Time For Goal Achievement: 10/18/20 Potential to Achieve Goals: Good ADL Goals Pt Will Transfer to Toilet: with +2 assist;with mod assist;stand pivot transfer;bedside commode Pt/caregiver will Perform Home Exercise Program: Increased strength;Both right and left upper extremity;With theraband;With written HEP provided Additional ADL Goal #1: pt will tolerate eob sitting min (A) as precursro to adls Additional ADL Goal #2: pt will complete basic  transfer total +2 mod (A)  Plan Discharge plan remains appropriate    Co-evaluation                 AM-PAC OT "6 Clicks" Daily Activity     Outcome Measure   Help from another person eating meals?: None Help from another person taking care of personal grooming?: A Little Help from another person toileting, which includes using toliet, bedpan, or urinal?: A Lot Help from another person bathing (including washing, rinsing, drying)?: A Lot Help from another person to put on and taking off regular upper body clothing?: A Little Help from another person to put on and taking off regular lower body clothing?: A Lot 6 Click Score: 16    End of Session    OT Visit Diagnosis: Unsteadiness on feet (R26.81);Muscle weakness (generalized) (M62.81);Pain;History of falling (Z91.81) Pain - Right/Left: Right (L) Pain - part of body: Leg;Knee   Activity Tolerance Patient tolerated treatment well   Patient Left in bed;with call bell/phone within reach;with bed alarm set   Nurse Communication Other (comment) (ordering PRAFOs)        [id number] OT Time Calculation (min): 27 min  Charges: OT General Charges $OT Visit: 1 Visit OT Treatments $Self Care/Home Management : 8-22 mins $Therapeutic Exercise: 8-22 mins  Maurie Boettcher, OT/L   Acute OT Clinical Specialist Acute Rehabilitation Services Pager [phone] Office [phone]    [facility] 10/10/2020, 5:00 PM

## 2020-10-10 NOTE — Progress Notes (Signed)
PT Cancellation Note  Patient Details Name: [name] [name] MRN: [id number] DOB: 1965-07-18   Cancelled Treatment:    Reason Eval/Treat Not Completed: Other (comment).  Pt was in HD this AM and when PT returned to try transfers, pt declined.  Retry tomorrow.   Ramond Dial 10/10/2020, 7:45 PM   Mee Hives, PT MS Acute Rehab Dept. Number: [place]

## 2020-10-10 NOTE — Progress Notes (Signed)
[facility] Progress Note   Subjective:  Seen on dialysis and the procedure was supervised.  Qb 250 mL/ min via TDC (needed TPA), UF goal 2L.    Objective Vitals:   10/10/20 1125 10/10/20 1130 10/10/20 1200 10/10/20 1230  BP: 135/64 137/68 120/65 (!) 112/55  Pulse: 66 65 62 (!) 59  Resp:      Temp:      TempSrc:      SpO2:      Weight:      Height:       Physical Exam Gen: sleepy, sitting in bed HEENT: EOMI PERRL  CV:  RRR Lungs: clear ant Extr:  No edema Neuro:  nonfocal Skin: LIJ TDC c/d/i  Additional Objective Labs: Basic Metabolic Panel: Recent Labs  Lab 10/08/20 0542 10/09/20 0619 10/10/20 0342  NA 135 132* 133*  K 4.8 4.2 4.5  CL 96* 98 97*  CO2 '28 27 27  '$ GLUCOSE 114* 93 167*  BUN 38* 23* 35*  CREATININE 5.87* 3.98* 5.22*  CALCIUM 8.9 8.6* 8.8*  PHOS 3.9 3.7 5.0*   Liver Function Tests: Recent Labs  Lab 10/08/20 0542 10/09/20 0619 10/10/20 0342  ALBUMIN 2.8* 2.6* 2.8*   No results for input(s): LIPASE, AMYLASE in the last 168 hours. CBC: Recent Labs  Lab 10/06/20 0502 10/07/20 0313 10/08/20 0542 10/09/20 0619 10/10/20 0651  WBC 6.1 5.1 6.2 6.4 6.4  NEUTROABS 2.9 2.1 2.2 2.5  --   HGB 9.3* 9.0* 9.6* 9.1* 9.7*  HCT 29.8* 28.7* 30.2* 29.0* 31.0*  MCV 88.7 87.5 88.3 89.0 87.6  PLT 165 155 195 152 201   Blood Culture    Component Value Date/Time   SDES BLOOD RIGHT HAND 10/02/2020 0755   SPECREQUEST  10/02/2020 0755    BOTTLES DRAWN AEROBIC AND ANAEROBIC Blood Culture results may not be optimal due to an inadequate volume of blood received in culture bottles   CULT  10/02/2020 0755    NO GROWTH 5 DAYS Performed at [facility], [place] [address], [place], [place] [address]    REPTSTATUS 10/07/2020 FINAL 10/02/2020 0755    Cardiac Enzymes: No results for input(s): CKTOTAL, CKMB, CKMBINDEX, TROPONINI in the last 168 hours. CBG: Recent Labs  Lab 10/09/20 0841 10/09/20 1218 10/09/20 1639 10/09/20 2058  10/10/20 0630  GLUCAP 96 81 163* 190* 137*   Iron Studies:  Recent Labs    10/10/20 0651  IRON 38  TIBC 182*  FERRITIN 625*   '@lablastinr3'$ @ Studies/Results: DG Knee Complete 4 Views Left  Result Date: 10/09/2020 CLINICAL DATA:  ORIF tibial plateau fracture LEFT knee EXAM: LEFT KNEE - COMPLETE 4+ VIEW; DG C-ARM 1-60 MIN COMPARISON:  10/07/2020 FLUOROSCOPY TIME:  0 minutes 40 seconds Dose: 2.25 mGy Images: 4 FINDINGS: Osseous demineralization. Lateral plate and multiple screws placed across a reduced fracture of the tibial metaphysis. Joint spaces preserved. Oblique mildly displaced proximal LEFT fibular diaphyseal fracture noted. IMPRESSION: Post ORIF of proximal LEFT tibial metaphyseal fracture. Mildly displaced proximal LEFT fibular fracture. Electronically Signed   By: Lavonia Dana M.D.   On: 10/09/2020 14:25   DG Knee Left Port  Result Date: 10/09/2020 CLINICAL DATA:  Post ORIF LEFT tibial fracture EXAM: PORTABLE LEFT KNEE - 1-2 VIEW COMPARISON:  Portable exam 1253 hours compared intraoperative study of 10/09/2020 FINDINGS: Lateral buttress plate and multiple screws at proximal LEFT tibia post ORIF of previously identified tibial metaphyseal fracture. No dislocation. Mildly displaced oblique fracture of the proximal LEFT fibular diaphysis noted. Knee  joint spaces preserved. Atherosclerotic calcifications of superficial femoral artery and popliteal artery into trifurcation vessels. IMPRESSION: Post ORIF of proximal LEFT tibial metaphyseal fracture. Mildly displaced oblique proximal LEFT fibular fracture. Electronically Signed   By: Lavonia Dana M.D.   On: 10/09/2020 14:27   DG C-Arm 1-60 Min  Result Date: 10/09/2020 CLINICAL DATA:  ORIF tibial plateau fracture LEFT knee EXAM: LEFT KNEE - COMPLETE 4+ VIEW; DG C-ARM 1-60 MIN COMPARISON:  10/07/2020 FLUOROSCOPY TIME:  0 minutes 40 seconds Dose: 2.25 mGy Images: 4 FINDINGS: Osseous demineralization. Lateral plate and multiple screws placed across a  reduced fracture of the tibial metaphysis. Joint spaces preserved. Oblique mildly displaced proximal LEFT fibular diaphyseal fracture noted. IMPRESSION: Post ORIF of proximal LEFT tibial metaphyseal fracture. Mildly displaced proximal LEFT fibular fracture. Electronically Signed   By: Lavonia Dana M.D.   On: 10/09/2020 14:25   Medications: . sodium chloride 10 mL/hr at 10/09/20 1018  . sodium chloride    . sodium chloride    .  ceFAZolin (ANCEF) IV     . amLODipine  10 mg Oral Daily  . bisacodyl  10 mg Rectal Once  . calcium carbonate  2 tablet Oral TID AC  . carvedilol  25 mg Oral BID  . Chlorhexidine Gluconate Cloth  6 each Topical Q0600  . Chlorhexidine Gluconate Cloth  6 each Topical Q0600  . darbepoetin (ARANESP) injection - DIALYSIS  150 mcg Intravenous Q Fri-HD  . heparin injection (subcutaneous)  5,000 Units Subcutaneous Q8H  . hydrALAZINE  50 mg Oral Q8H  . insulin aspart  0-9 Units Subcutaneous TID WC  . insulin glargine  5 Units Subcutaneous QHS  . isosorbide dinitrate  20 mg Oral TID  . lidocaine  1 patch Transdermal Q24H  . polyethylene glycol  17 g Oral BID  . senna-docusate  2 tablet Oral BID   HD orders:  Triad 244mn BFR 350 DFR 600 EDW 62kg 2K/2.5Ca F180, heparin 5000 bolus and 500/hr maint  Assessment/Plan **Hip fracture: s/p operative management per ortho.  PT eval to determine dispo.  Please avoid morphine in this ESRD patient.  No evidence of DVT although limited exam on 10/06/20  ** L prox tibia fracture- for ORIF tomorrow 10/09/20.    **ESRD on HD MWF:  s/p HD Friday per schedule.  Continue MWF schedule.  L IJ TDC hasn't been able to run properly the last 2 rx and had to have TPA.  Will ask IR to exchange nonurgently.  **BMM: clarified - she takes ca carbonate 2 TIDAC as binder, Phos - 6.5, resumed home binder; corr ca ok.   **DM: insulin per primary  **HTN: continue home meds; per outpt HD records frequent HTN.   **Anemia:  Hb 10.6 > 9.8 > 9.6. post op.   Trend. Check iron indices.   EMadelon LipsMD 10/10/2020, 2:37 PM  [facility] Pager: [phone]

## 2020-10-10 NOTE — Consult Note (Signed)
Patient Status: [name] - In-pt  Assessment and Plan: History of ESRD on HD via left IJ tunneled HD catheter, with catheter malfunction (requires tPA each use). Plan for image-guided tunneled HD catheter exchange in IR tentatively for Monday 10/13/2020 pending IR schedule and patient giving consent. Patient refuses to give consent until she "speaks with the surgeon" who will be placing catheter. Explained she will meet him prior to procedure and can sign consent then. Patient refuses to proceed without sedation- states that it was too painful last time using just local anesthetic. Attempted to explain there are more risks associated with sedating for a simple procedure, however still refuses. Will make NPO at midnight for Monday. Afebrile and WBCs WNL.  Risks and benefits discussed with the patient including, but not limited to bleeding, infection, vascular injury, pneumothorax which may require chest tube placement, air embolism or even death. All of the patient's questions were answered, however patient refuses to consent until speaks with IR rad who will preform procedure.  ______________________________________________________________________   History of Present Illness: [name] [name] is a 55 y.o. female with a past medical history of hypertension, mitral regurgitation, GERD, colitis, ESRD on HD, and diabetes mellitus with associated peripheral neuropathy. She has been admitted to [facility] 10/02/2020 for management of multiple fractures following fall. She underwent percutaneous pinning of right femoral neck in OR 10/02/2020, along with ORIF of bicondylar left tibial plateau fracture in OR 10/09/2020. She has a history of ESRD on HD, therefore nephrology consulted on admission for management. She undergoes dialysis M/W/F via left IJ tunneled HD catheter, however the past two times has required tPA dwell for catheter to function.  IR consulted by Dr. Hollie Salk for possible image-guided tunneled HD  catheter exchange. Patient awake and alert sitting in bed with no complaints at this time. Denies fever, chills, chest pain, dyspnea, abdominal pain, or headache.   Allergies and medications reviewed.   Review of Systems: A 12 point ROS discussed and pertinent positives are indicated in the HPI above.  All other systems are negative.  Review of Systems  Constitutional: Negative for chills and fever.  Respiratory: Negative for shortness of breath and wheezing.   Cardiovascular: Negative for chest pain and palpitations.  Gastrointestinal: Negative for abdominal pain.  Neurological: Negative for headaches.  Psychiatric/Behavioral: Negative for behavioral problems and confusion.    Vital Signs: BP (!) 114/58 (BP Location: Right Arm)   Pulse 61   Temp 98.7 F (37.1 C) (Oral)   Resp 16   Ht 5' (1.524 m)   Wt 142 lb 6.7 oz (64.6 kg)   SpO2 98%   BMI 27.81 kg/m   Physical Exam Vitals and nursing note reviewed.  Constitutional:      General: She is not in acute distress. Cardiovascular:     Rate and Rhythm: Normal rate and regular rhythm.     Heart sounds: Normal heart sounds. No murmur heard.   Pulmonary:     Effort: Pulmonary effort is normal. No respiratory distress.     Breath sounds: Normal breath sounds. No wheezing.  Skin:    General: Skin is warm and dry.     Comments: Left chest at site of tunneled HD catheter without tenderness, erythema, drainage, or active bleeding.  Neurological:     Mental Status: She is alert and oriented to person, place, and time.      Imaging reviewed.   Labs:  COAGS: Recent Labs    03/07/20 1128 03/08/20 1924  INR 1.1  1.1  APTT 31  --     BMP: Recent Labs    03/10/20 1324 03/12/20 0557 03/13/20 0913 03/14/20 0628 05/27/20 1429 10/07/20 0313 10/08/20 0542 10/09/20 0619 10/10/20 0342  NA 140 136 140 140   < > 133* 135 132* 133*  K 5.3* 4.6 4.2 4.5   < > 4.2 4.8 4.2 4.5  CL 102 98 101 100   < > 97* 96* 98 97*  CO2  '22 25 26 26   '$ < > '27 28 27 27  '$ GLUCOSE 98 99 108* 157*   < > 167* 114* 93 167*  BUN 48* 34* 25* 44*   < > 22* 38* 23* 35*  CALCIUM 8.5* 8.8* 9.1 9.0   < > 8.3* 8.9 8.6* 8.8*  CREATININE 5.63* 4.28* 3.81* 5.15*   < > 4.34* 5.87* 3.98* 5.22*  GFRNONAA 8* 11* 13* 9*   < > 12* 8* 13* 9*  GFRAA 9* 13* 15* 10*  --   --   --   --   --    < > = values in this interval not displayed.       Electronically Signed: Earley Abide, PA-C 10/10/2020, 4:25 PM   I spent a total of 25 minutes in face to face in clinical consultation, greater than 50% of which was counseling/coordinating care for HD access.

## 2020-10-10 NOTE — Progress Notes (Addendum)
HD#8 Subjective:   No acute events overnight.   Patient evaluated during HD session this morning. She is brighter appearing today. Reports her left lower extremity pain is improved now that she's had surgery. She reports she has still not had a BM despite the suppository two days ago. Denies chest pain, nausea/vomiting, difficulty breathing. She requests a diet other than renal diet. Discussed the importance of physical therapy, and she reports she has thought about it, spoken with her family, and has decided that going to SNF for rehab is in her best interest.  Objective:   Vital signs in last 24 hours: Vitals:   10/09/20 1245 10/09/20 1247 10/09/20 1300 10/10/20 0425  BP:  138/68 (!) 162/72 136/61  Pulse: (!) 54 (!) 53 (!) 56 62  Resp: '14 14 16 17  '$ Temp:   97.7 F (36.5 C) 98.2 F (36.8 C)  TempSrc:   Oral Oral  SpO2: 96% 97% 98% 100%  Weight:      Height:       Physical Exam General: chronically-ill appearing, appears brighter and more energetic this morning, in NAD Cardiac: regular rate and rhythm Extremities/Skin: trace edema of the L toes, otherwise LLE fully bandaged; right lower extremity without edema. Right lateral hip surgical incision covered with an intact and clean dressing without surrounding erythema. Feet warm, sensation intact. Patient able to wiggle bilateral toes slightly however pain-limited. Pulm: breathing comfortably on room air Abdominal: soft, non-distended, non-tender  Pertinent Labs: CBC Latest Ref Rng & Units 10/09/2020 10/08/2020 10/07/2020  WBC 4.0 - 10.5 K/uL 6.4 6.2 5.1  Hemoglobin 12.0 - 15.0 g/dL 9.1(L) 9.6(L) 9.0(L)  Hematocrit 36.0 - 46.0 % 29.0(L) 30.2(L) 28.7(L)  Platelets 150 - 400 K/uL 152 195 155    CMP Latest Ref Rng & Units 10/10/2020 10/09/2020 10/08/2020  Glucose 70 - 99 mg/dL 167(H) 93 114(H)  BUN 6 - 20 mg/dL 35(H) 23(H) 38(H)  Creatinine 0.44 - 1.00 mg/dL 5.22(H) 3.98(H) 5.87(H)  Sodium 135 - 145 mmol/L 133(L) 132(L) 135  Potassium  3.5 - 5.1 mmol/L 4.5 4.2 4.8  Chloride 98 - 111 mmol/L 97(L) 98 96(L)  CO2 22 - 32 mmol/L '27 27 28  '$ Calcium 8.9 - 10.3 mg/dL 8.8(L) 8.6(L) 8.9  Total Protein 6.5 - 8.1 g/dL - - -  Total Bilirubin 0.3 - 1.2 mg/dL - - -  Alkaline Phos 38 - 126 U/L - - -  AST 15 - 41 U/L - - -  ALT 0 - 44 U/L - - -    Assessment/Plan:   Principal Problem:   Fracture of femoral neck, right (HCC) Active Problems:   ESRD (end stage renal disease) (HCC)   Closed fracture of left proximal tibia   Patient Summary:  [name] [name] is a 55 y.o. woman with history of ESRD on HD MWF, diabetes type 2, hypertension, NAFLD, GERD, history of recurrent UTIs, and chronic bilateral lower extremity weakness who presented after a fall and found to have a R femoral neck fracture. She underwent percutaneous pinning on 10/02/20. On 10/07/20 she was found to have a right tibia and fibular fracture.   This is hospital day 8.  R proximal tibia and fibular fractures s/p ORIF 4/7 (POD 1) Plan per orthopedics Pain well-controlled on current regimen. Per ortho, suspect patient had stress fracture that completed upon weight-bearing activity.  Mobility restrictions: NWB on L leg, unrestricted ROM L knee, no bracing Dressing changes: tomorrow (10/11/20) Pain management: ice and elevate; oxycodone 7.5 mg  q4h prn, oxy 5 mg q6h ice packs, lidocaine patch.  Right femoral neck fracture s/p percutaneous pinning 3/31 (POD 7) Plan per orthopedics Mobility restrictions: WBAT for transfers only; unrestricted ROM R hip and knee Therapy: PT and OT recommend SNF, patient is amenable to this plan Dressing changes prn Pain management: oxycodone 7.5 mg q4h prn, oxy 5 mg q6h, lidocaine patch. Follow-up needs: suture removal for R hip ~4/14  Constipation Will given additional suppository today as patient reports no BM this admission. - bisacodyl suppository today - Miralax twice daily and Senokot-S to two tabs twice daily; s/p bisacodyl  suppository (4/6)  Fever (resolved): HDS, afebrile, Blood cultures NGTD. ESRD on HD MWF. Management per nephrology. HD today. Switched patient's diet to carb-modified from renal-carb, nephrology aware.  Type 2 diabetes mellitus. CBGs at goal. Continue lantus 5 QHS and SSI. Uncontrolled hypertension, improved Normotensive. Continue amlodipine, hydralazine, imdur, coreg.  VTE: Heparin Code: Full Dispo: PT/OT recommend SNF, patient prefers HH. Patient needs reevaluation after ORIF.  Please contact the on call pager after 5 pm and on weekends at [phone].  Alexandria Lodge, MD Internal Medicine Resident, PGY-1 [facility] Pager: [phone] 7:36 AM, 10/10/2020

## 2020-10-11 LAB — CBC WITH DIFFERENTIAL/PLATELET
Abs Immature Granulocytes: 0.09 10*3/uL — ABNORMAL HIGH (ref 0.00–0.07)
Basophils Absolute: 0 10*3/uL (ref 0.0–0.1)
Basophils Relative: 0 %
Eosinophils Absolute: 0.2 10*3/uL (ref 0.0–0.5)
Eosinophils Relative: 2 %
HCT: 25.7 % — ABNORMAL LOW (ref 36.0–46.0)
Hemoglobin: 8.2 g/dL — ABNORMAL LOW (ref 12.0–15.0)
Immature Granulocytes: 1 %
Lymphocytes Relative: 44 %
Lymphs Abs: 3.2 10*3/uL (ref 0.7–4.0)
MCH: 28 pg (ref 26.0–34.0)
MCHC: 31.9 g/dL (ref 30.0–36.0)
MCV: 87.7 fL (ref 80.0–100.0)
Monocytes Absolute: 1 10*3/uL (ref 0.1–1.0)
Monocytes Relative: 14 %
Neutro Abs: 2.8 10*3/uL (ref 1.7–7.7)
Neutrophils Relative %: 39 %
Platelets: 177 10*3/uL (ref 150–400)
RBC: 2.93 MIL/uL — ABNORMAL LOW (ref 3.87–5.11)
RDW: 17.7 % — ABNORMAL HIGH (ref 11.5–15.5)
WBC: 7.3 10*3/uL (ref 4.0–10.5)
nRBC: 0 % (ref 0.0–0.2)

## 2020-10-11 LAB — RENAL FUNCTION PANEL
Albumin: 2.6 g/dL — ABNORMAL LOW (ref 3.5–5.0)
Anion gap: 6 (ref 5–15)
BUN: 27 mg/dL — ABNORMAL HIGH (ref 6–20)
CO2: 27 mmol/L (ref 22–32)
Calcium: 8.4 mg/dL — ABNORMAL LOW (ref 8.9–10.3)
Chloride: 100 mmol/L (ref 98–111)
Creatinine, Ser: 3.89 mg/dL — ABNORMAL HIGH (ref 0.44–1.00)
GFR, Estimated: 13 mL/min — ABNORMAL LOW (ref >60)
Glucose, Bld: 120 mg/dL — ABNORMAL HIGH (ref 70–99)
Phosphorus: 3 mg/dL (ref 2.5–4.6)
Potassium: 4 mmol/L (ref 3.5–5.1)
Sodium: 133 mmol/L — ABNORMAL LOW (ref 135–145)

## 2020-10-11 LAB — GLUCOSE, CAPILLARY
Glucose-Capillary: 115 mg/dL — ABNORMAL HIGH (ref 70–99)
Glucose-Capillary: 131 mg/dL — ABNORMAL HIGH (ref 70–99)
Glucose-Capillary: 76 mg/dL (ref 70–99)
Glucose-Capillary: 91 mg/dL (ref 70–99)

## 2020-10-11 NOTE — Evaluation (Signed)
Physical Therapy Re-Evaluation Patient Details Name: [name] [name] MRN: [id number] DOB: [birth year] Today's Date: 10/11/2020   History of Present Illness  55 year old female adm with  R femoral neck fx s/p precutaneoius pinnning on 3/31. Underwent ORIF of L tibial plateau on 10/09/2020. PMH: ESRD on HD, HTN, syncope and collapse, diabetic neuropathy, mitral regurgitation, impaired vision, HTN, heart murmur, colitis, and DM.    Clinical Impression  The pt was seen today for re-evaluation following ORIF of LLE. The pt was able to complete bed mobility and transition to sitting EOB with mod-maxA of 2, heavy use of bed rails, and elevated HOB. The pt is able to initiate movement at hip to move BLE, but unable to complete movement of either LE due to pain at this time. Pt reports dizziness with sitting EOB, but VSS at this time. The pt presents with significant limitations in strength, activity tolerance, and ROM in BLE, as well as poor static and dynamic stability. The pt declined attempt to complete sit-stand transfers at this time, and required totalA to complete lateral scooting due to inability to generate movement with BUE or RLE at this time. Will continue to benefit from skilled PT acutely to progress deficits and advance capacity for OOB transfers, but will also need SNF rehab after d/c to continue progression of strength and mobility.     Follow Up Recommendations SNF    Equipment Recommendations  None recommended by PT    Recommendations for Other Services       Precautions / Restrictions Precautions Precautions: Fall Required Braces or Orthoses: Other Brace Other Brace: B PRAFO Restrictions Weight Bearing Restrictions: Yes RLE Weight Bearing: Weight bearing as tolerated (for transfers only) LLE Weight Bearing: Non weight bearing      Mobility  Bed Mobility Overal bed mobility: Needs Assistance Bed Mobility: Supine to Sit Rolling: Max assist;+2 for physical assistance   Supine  to sit: Mod assist;+2 for physical assistance;HOB elevated     General bed mobility comments: pt able to roll better to L with good use of RUE to reach and pull. modA with sequential cues to elevate from [facility]    Transfers Overall transfer level: Needs assistance Equipment used: None Transfers: Lateral/Scoot Transfers          Lateral/Scoot Transfers: Total assist;+2 physical assistance General transfer comment: pt declined standing attempt this session, attempting to scoot laterally along EOB, but needing totalA as pt unable to generate any movement at hips with use of BUE and assist of +2      Balance Overall balance assessment: Needs assistance;History of Falls Sitting-balance support: Single extremity supported;Feet supported Sitting balance-Leahy Scale: Poor Sitting balance - Comments: reliant on UE support, pt with multiple minor posterior LOB, but able to self-correct Postural control: Posterior lean                                   Pertinent Vitals/Pain Pain Assessment: Faces Pain Score: 5  Faces Pain Scale: Hurts even more Pain Location: BLE, L knee Pain Descriptors / Indicators: Discomfort;Grimacing;Guarding Pain Intervention(s): Monitored during session;Limited activity within patient's tolerance;Repositioned;RN gave pain meds during session    Home Living Family/patient expects to be discharged to:: Skilled nursing facility                      Prior Function Level of Independence: Needs assistance  Hand Dominance   Dominant Hand: Right    Extremity/Trunk Assessment   Upper Extremity Assessment Upper Extremity Assessment: Generalized weakness    Lower Extremity Assessment Lower Extremity Assessment: Generalized weakness;RLE deficits/detail;LLE deficits/detail RLE Deficits / Details: able to move at all joints, limited SLR, able to complete ABD/ADD at hip, knee flexion, and ankle DF. RLE: Unable to fully assess  due to pain LLE Deficits / Details: limited MMT due to NWB. pt able to complete IR and ER at hip, as well as hip ADD/ABD with minA in gravity minimized position LLE: Unable to fully assess due to pain    Cervical / Trunk Assessment Cervical / Trunk Assessment: Kyphotic  Communication   Communication: No difficulties  Cognition Arousal/Alertness: Awake/alert Behavior During Therapy: WFL for tasks assessed/performed Overall Cognitive Status: No family/caregiver present to determine baseline cognitive functioning Area of Impairment: Safety/judgement;Problem solving                         Safety/Judgement: Decreased awareness of safety   Problem Solving: Requires verbal cues;Slow processing General Comments: pt requiring max encouragement for mobility, explanation of role of mobility to impact other conditions, eventually agreeable.      General Comments General comments (skin integrity, edema, etc.): B PRAFOs repositioned to improve fit/increase dorsiflexion    Exercises General Exercises - Lower Extremity Ankle Circles/Pumps: AAROM;Both;10 reps;Seated Short Arc Quad: AAROM;Both;5 reps;Supine Other Exercises Other Exercises: B HC stretches 3x 15 sec holds.   Assessment/Plan    PT Assessment Patient needs continued PT services  PT Problem List Decreased strength;Decreased activity tolerance;Decreased balance;Decreased mobility;Pain       PT Treatment Interventions DME instruction;Gait training;Functional mobility training;Therapeutic activities;Therapeutic exercise;Balance training;Patient/family education    PT Goals (Current goals can be found in the Care Plan section)  Acute Rehab PT Goals Patient Stated Goal: to eventually return home PT Goal Formulation: With patient Time For Goal Achievement: 10/25/20 Potential to Achieve Goals: Fair    Frequency Min 3X/week    AM-PAC PT "6 Clicks" Mobility  Outcome Measure Help needed turning from your back to your  side while in a flat bed without using bedrails?: A Lot Help needed moving from lying on your back to sitting on the side of a flat bed without using bedrails?: A Lot Help needed moving to and from a bed to a chair (including a wheelchair)?: A Lot Help needed standing up from a chair using your arms (e.g., wheelchair or bedside chair)?: A Lot Help needed to walk in hospital room?: Total Help needed climbing 3-5 steps with a railing? : Total 6 Click Score: 10    End of Session Equipment Utilized During Treatment: Gait belt Activity Tolerance: Patient limited by pain Patient left: in bed;with call bell/phone within reach;with bed alarm set Nurse Communication: Mobility status;Need for lift equipment PT Visit Diagnosis: Other abnormalities of gait and mobility (R26.89);History of falling (Z91.81);Pain Pain - Right/Left: Right Pain - part of body: Hip    Time: 1211-1249 PT Time Calculation (min) (ACUTE ONLY): 38 min   Charges:   PT Evaluation $PT Re-evaluation: 1 Re-eval PT Treatments $Therapeutic Exercise: 8-22 mins $Therapeutic Activity: 8-22 mins        Karma Ganja, PT, DPT   Acute Rehabilitation Department Pager #: [phone]  Otho Bellows 10/11/2020, 1:37 PM

## 2020-10-11 NOTE — Progress Notes (Signed)
[facility] Progress Note   Subjective:  Had a fever to 101.2 last night.  WBC OK, no symptoms of infection- f/c, n/v, cough, SOB, pain at surgical sites,  Objective Vitals:   10/10/20 1357 10/10/20 2048 10/11/20 0414 10/11/20 0917  BP: (!) 114/58 (!) 107/54 (!) 124/59 (!) 109/54  Pulse: 61 62 64 62  Resp: '16 18 18 16  '$ Temp: 98.7 F (37.1 C) 99.2 F (37.3 C) (!) 101.2 F (38.4 C) 99.5 F (37.5 C)  TempSrc: Oral Oral Oral Oral  SpO2: 98% 98% 99% 97%  Weight:      Height:       Physical Exam Gen: sleepy, sitting in bed HEENT: EOMI PERRL  CV:  RRR Lungs: clear ant Extr:  No edema Neuro:  nonfocal Skin: LIJ TDC c/d/i  Additional Objective Labs: Basic Metabolic Panel: Recent Labs  Lab 10/09/20 0619 10/10/20 0342 10/11/20 0201  NA 132* 133* 133*  K 4.2 4.5 4.0  CL 98 97* 100  CO2 '27 27 27  '$ GLUCOSE 93 167* 120*  BUN 23* 35* 27*  CREATININE 3.98* 5.22* 3.89*  CALCIUM 8.6* 8.8* 8.4*  PHOS 3.7 5.0* 3.0   Liver Function Tests: Recent Labs  Lab 10/09/20 0619 10/10/20 0342 10/11/20 0201  ALBUMIN 2.6* 2.8* 2.6*   No results for input(s): LIPASE, AMYLASE in the last 168 hours. CBC: Recent Labs  Lab 10/07/20 0313 10/08/20 0542 10/09/20 0619 10/10/20 0651 10/11/20 0201  WBC 5.1 6.2 6.4 6.4 7.3  NEUTROABS 2.1 2.2 2.5  --  2.8  HGB 9.0* 9.6* 9.1* 9.7* 8.2*  HCT 28.7* 30.2* 29.0* 31.0* 25.7*  MCV 87.5 88.3 89.0 87.6 87.7  PLT 155 195 152 201 177   Blood Culture    Component Value Date/Time   SDES BLOOD RIGHT HAND 10/02/2020 0755   SPECREQUEST  10/02/2020 0755    BOTTLES DRAWN AEROBIC AND ANAEROBIC Blood Culture results may not be optimal due to an inadequate volume of blood received in culture bottles   CULT  10/02/2020 0755    NO GROWTH 5 DAYS Performed at [facility], [place] [address], [place], [place] [address]    REPTSTATUS 10/07/2020 FINAL 10/02/2020 0755    Cardiac Enzymes: No results for input(s): CKTOTAL, CKMB, CKMBINDEX,  TROPONINI in the last 168 hours. CBG: Recent Labs  Lab 10/09/20 2058 10/10/20 0630 10/10/20 1624 10/10/20 2049 10/11/20 0704  GLUCAP 190* 137* 139* 144* 115*   Iron Studies:  Recent Labs    10/10/20 0651  IRON 38  TIBC 182*  FERRITIN 625*   '@lablastinr3'$ @ Studies/Results: DG Knee Complete 4 Views Left  Result Date: 10/09/2020 CLINICAL DATA:  ORIF tibial plateau fracture LEFT knee EXAM: LEFT KNEE - COMPLETE 4+ VIEW; DG C-ARM 1-60 MIN COMPARISON:  10/07/2020 FLUOROSCOPY TIME:  0 minutes 40 seconds Dose: 2.25 mGy Images: 4 FINDINGS: Osseous demineralization. Lateral plate and multiple screws placed across a reduced fracture of the tibial metaphysis. Joint spaces preserved. Oblique mildly displaced proximal LEFT fibular diaphyseal fracture noted. IMPRESSION: Post ORIF of proximal LEFT tibial metaphyseal fracture. Mildly displaced proximal LEFT fibular fracture. Electronically Signed   By: Lavonia Dana M.D.   On: 10/09/2020 14:25   DG Knee Left Port  Result Date: 10/09/2020 CLINICAL DATA:  Post ORIF LEFT tibial fracture EXAM: PORTABLE LEFT KNEE - 1-2 VIEW COMPARISON:  Portable exam 1253 hours compared intraoperative study of 10/09/2020 FINDINGS: Lateral buttress plate and multiple screws at proximal LEFT tibia post ORIF of previously identified tibial metaphyseal  fracture. No dislocation. Mildly displaced oblique fracture of the proximal LEFT fibular diaphysis noted. Knee joint spaces preserved. Atherosclerotic calcifications of superficial femoral artery and popliteal artery into trifurcation vessels. IMPRESSION: Post ORIF of proximal LEFT tibial metaphyseal fracture. Mildly displaced oblique proximal LEFT fibular fracture. Electronically Signed   By: Lavonia Dana M.D.   On: 10/09/2020 14:27   DG C-Arm 1-60 Min  Result Date: 10/09/2020 CLINICAL DATA:  ORIF tibial plateau fracture LEFT knee EXAM: LEFT KNEE - COMPLETE 4+ VIEW; DG C-ARM 1-60 MIN COMPARISON:  10/07/2020 FLUOROSCOPY TIME:  0 minutes  40 seconds Dose: 2.25 mGy Images: 4 FINDINGS: Osseous demineralization. Lateral plate and multiple screws placed across a reduced fracture of the tibial metaphysis. Joint spaces preserved. Oblique mildly displaced proximal LEFT fibular diaphyseal fracture noted. IMPRESSION: Post ORIF of proximal LEFT tibial metaphyseal fracture. Mildly displaced proximal LEFT fibular fracture. Electronically Signed   By: Lavonia Dana M.D.   On: 10/09/2020 14:25   Medications: . sodium chloride 10 mL/hr at 10/09/20 1018  .  ceFAZolin (ANCEF) IV     . amLODipine  10 mg Oral Daily  . calcium carbonate  2 tablet Oral TID AC  . carvedilol  25 mg Oral BID  . Chlorhexidine Gluconate Cloth  6 each Topical Q0600  . Chlorhexidine Gluconate Cloth  6 each Topical Q0600  . darbepoetin (ARANESP) injection - DIALYSIS  150 mcg Intravenous Q Fri-HD  . heparin injection (subcutaneous)  5,000 Units Subcutaneous Q8H  . hydrALAZINE  50 mg Oral Q8H  . insulin aspart  0-9 Units Subcutaneous TID WC  . insulin glargine  5 Units Subcutaneous QHS  . isosorbide dinitrate  20 mg Oral TID  . lidocaine  1 patch Transdermal Q24H  . polyethylene glycol  17 g Oral BID  . senna-docusate  2 tablet Oral BID   HD orders:  Triad 287mn BFR 350 DFR 600 EDW 62kg 2K/2.5Ca F180, heparin 5000 bolus and 500/hr maint  Assessment/Plan **Hip fracture: s/p operative management per ortho.  PT eval to determine dispo.  Please avoid morphine in this ESRD patient.  No evidence of DVT although limited exam on 10/06/20  ** L prox tibia fracture- for ORIF tomorrow 10/09/20.    **ESRD on HD MWF:  s/p HD Friday per schedule.  Continue MWF schedule.  L IJ TDC hasn't been able to run properly the last 2 rx and had to have TPA.  Will ask IR to exchange nonurgently.  **BMM: clarified - she takes ca carbonate 2 TIDAC as binder, Phos - 6.5, resumed home binder; corr ca ok.   **DM: insulin per primary  **HTN: continue home meds; per outpt HD records frequent HTN.    **Anemia:  Hb 10.6 > 9.8 > 9.6. post op.  Trend. Check iron indices.   ** Fever: overnight 10/10/20.  Normally would not order cultures but [facility] sure [facility] doesn't have occult bacteremia (which I doubt)  EMadelon LipsMD 10/11/2020, 11:21 AM  [facility] Pager: [phone]

## 2020-10-11 NOTE — Progress Notes (Signed)
Occupational Therapy Treatment Patient Details Name: [name] [name] MRN: [id number] DOB: 1965-10-16 Today's Date: 10/11/2020    History of present illness 55 year old female adm with  R femoral neck fx s/p precutaneoius pinnning on 3/31. Underwent OPEN REDUCTION INTERNAL FIXATION (ORIF) BICONDYLAR TIBIAL PLATEAU (Left) 10/09/2020.     PMH: ESRD on HD, HTN, syncope and collapse, diabetic neuropathy, mitral regurgitation, impaired vision, HTN, heart murmur, colitis, and DM.   OT comments  B PRAFO splints adjusted to increase dorsiflexion. Attempted to mobilize pt OOB to chair, however pt adamantly declined. Attempted ot work on bed level exercise and ADL however pt declined further therapy. Educated pt on importance of paritcipation with OT/PT. Educated pt that PT plans to see her later this date. Pt will benefit from rehab at SNF. Will continue to follow acutely.   Follow Up Recommendations  SNF    Equipment Recommendations  Hospital bed    Recommendations for Other Services      Precautions / Restrictions Precautions Precautions: Fall Required Braces or Orthoses: Other Brace (B PRAFO) Restrictions Weight Bearing Restrictions: Yes RLE Weight Bearing: Weight bearing as tolerated (for transfers only) LLE Weight Bearing: Non weight bearing       Mobility Bed Mobility               General bed mobility comments: Pt declined    Transfers                 General transfer comment: pt declined    Balance                                           ADL either performed or assessed with clinical judgement   ADL                                         General ADL Comments: pt declined     Vision       Perception     Praxis      Cognition Arousal/Alertness: Awake/alert Behavior During Therapy: Flat affect;Agitated Overall Cognitive Status: No family/caregiver present to determine baseline cognitive functioning                                           Exercises Other Exercises Other Exercises: B HC stretches 3x 15 sec holds.   Shoulder Instructions       General Comments B PRAFOs repositioned to improve fit/increase dorsiflexion    Pertinent Vitals/ Pain       Pain Assessment: 0-10 Pain Score: 5  Pain Location: L knee > R hip Pain Descriptors / Indicators: Discomfort;Grimacing;Guarding Pain Intervention(s): Limited activity within patient's tolerance  Home Living                                          Prior Functioning/Environment              Frequency  Min 2X/week        Progress Toward Goals  OT Goals(current goals can now be found in the care plan section)  Progress  towards OT goals: Not progressing toward goals - comment (pt declining to move OOB this date or complete bed level exercise or ADL)  Acute Rehab OT Goals Patient Stated Goal: to eventually return home OT Goal Formulation: With patient Time For Goal Achievement: 10/18/20 Potential to Achieve Goals: Good ADL Goals Pt Will Transfer to Toilet: with +2 assist;with mod assist;stand pivot transfer;bedside commode Pt/caregiver will Perform Home Exercise Program: Increased strength;Both right and left upper extremity;With theraband;With written HEP provided Additional ADL Goal #1: pt will tolerate eob sitting min (A) as precursro to adls Additional ADL Goal #2: pt will complete basic transfer total +2 mod (A)  Plan Discharge plan remains appropriate    Co-evaluation                 AM-PAC OT "6 Clicks" Daily Activity     Outcome Measure   Help from another person eating meals?: None Help from another person taking care of personal grooming?: A Little Help from another person toileting, which includes using toliet, bedpan, or urinal?: A Lot Help from another person bathing (including washing, rinsing, drying)?: A Lot Help from another person to put on and taking off regular  upper body clothing?: A Little Help from another person to put on and taking off regular lower body clothing?: A Lot 6 Click Score: 16    End of Session    OT Visit Diagnosis: Unsteadiness on feet (R26.81);Muscle weakness (generalized) (M62.81);Pain;History of falling (Z91.81) Pain - Right/Left: Right Pain - part of body: Leg;Knee   Activity Tolerance Other (comment) (pt complaining of nausea)   Patient Left in bed;with call bell/phone within reach;with bed alarm set   Nurse Communication Mobility status;Other (comment) (use of PRAFOs)        Time: 1000-1011 OT Time Calculation (min): 11 min  Charges: OT General Charges $OT Visit: 1 Visit OT Treatments $Therapeutic Activity: 8-22 mins  Maurie Boettcher, OT/L   Acute OT Clinical Specialist [place] Pager [phone] Office [phone]    [facility] 10/11/2020, 1:20 PM

## 2020-10-11 NOTE — Plan of Care (Signed)

## 2020-10-11 NOTE — Plan of Care (Signed)
  Problem: Activity: Goal: Risk for activity intolerance will decrease Outcome: Progressing   Problem: Coping: Goal: Level of anxiety will decrease Outcome: Progressing   Problem: Pain Managment: Goal: General experience of comfort will improve Outcome: Progressing   Problem: Safety: Goal: Ability to remain free from injury will improve Outcome: Progressing   Problem: Skin Integrity: Goal: Risk for impaired skin integrity will decrease Outcome: Progressing   

## 2020-10-11 NOTE — Progress Notes (Signed)
SPORTS MEDICINE AND JOINT REPLACEMENT  Lara Mulch, MD    Carlyon Shadow, PA-C [place], [place], [place]  [address]                             [phone]   PROGRESS NOTE  Subjective:  negative for Chest Pain  negative for Shortness of Breath  negative for Nausea/Vomiting   negative for Calf Pain  negative for Bowel Movement   Tolerating Diet: yes         Patient reports pain as 5 on 0-10 scale.    Objective: Vital signs in last 24 hours:   Patient Vitals for the past 24 hrs:  BP Temp Temp src Pulse Resp SpO2 Weight  10/11/20 0414 (!) 124/59 (!) 101.2 F (38.4 C) Oral 64 18 99 % --  10/10/20 2048 (!) 107/54 99.2 F (37.3 C) Oral 62 18 98 % --  10/10/20 1357 (!) 114/58 98.7 F (37.1 C) Oral 61 16 98 % --  10/10/20 1330 124/66 -- -- -- -- -- --  10/10/20 1300 (!) 110/59 -- -- (!) 58 -- -- --  10/10/20 1230 (!) 112/55 -- -- (!) 59 -- -- --  10/10/20 1200 120/65 -- -- 62 -- -- --  10/10/20 1130 137/68 -- -- 65 -- -- --  10/10/20 1125 135/64 -- -- 66 -- -- --  10/10/20 1000 (!) 107/55 -- -- (!) 59 -- -- --  10/10/20 0930 101/67 -- -- (!) 49 -- -- --  10/10/20 0900 126/63 -- -- 71 -- -- --  10/10/20 0849 110/77 98.8 F (37.1 C) Oral 71 16 98 % 64.6 kg  10/10/20 0824 (!) 110/56 99.9 F (37.7 C) Oral 65 18 99 % --    '@flow'$ {1959:LAST@   Intake/Output from previous day:   04/08 0701 - 04/09 0700 In: 240 [P.O.:240] Out: 1149    Intake/Output this shift:   No intake/output data recorded.   Intake/Output      04/08 0701 04/09 0700 04/09 0701 04/10 0700   P.O. 240    I.V. (mL/kg)     IV Piggyback     Total Intake(mL/kg) 240 (3.7)    Urine (mL/kg/hr) 0 (0)    Other 1149    Stool 0    Blood     Total Output 1149    Net -909         Stool Occurrence 1 x       LABORATORY DATA: Recent Labs    10/05/20 0239 10/06/20 0502 10/07/20 0313 10/08/20 0542 10/09/20 0619 10/10/20 0651 10/11/20 0201  WBC 7.8 6.1 5.1 6.2 6.4 6.4 7.3  HGB 9.6* 9.3*  9.0* 9.6* 9.1* 9.7* 8.2*  HCT 31.0* 29.8* 28.7* 30.2* 29.0* 31.0* 25.7*  PLT 145* 165 155 195 152 201 177   Recent Labs    10/05/20 0239 10/06/20 0502 10/07/20 0313 10/08/20 0542 10/09/20 0619 10/10/20 0342 10/11/20 0201  NA 136 134* 133* 135 132* 133* 133*  K 4.6 4.8 4.2 4.8 4.2 4.5 4.0  CL 98 93* 97* 96* 98 97* 100  CO2 '24 27 27 28 27 27 27  '$ BUN 33* 46* 22* 38* 23* 35* 27*  CREATININE 5.38* 6.66* 4.34* 5.87* 3.98* 5.22* 3.89*  GLUCOSE 76 103* 167* 114* 93 167* 120*  CALCIUM 8.6* 8.5* 8.3* 8.9 8.6* 8.8* 8.4*   Lab Results  Component Value Date   INR 1.1 03/08/2020   INR 1.1 03/07/2020  INR 1.0 10/10/2014    Examination:  General appearance: alert, cooperative and no distress Extremities: extremities normal, atraumatic, no cyanosis or edema  Wound Exam: clean, dry, intact   Drainage:  None: wound tissue dry  Motor Exam: Quadriceps and Hamstrings Intact  Sensory Exam: Superficial Peroneal, Deep Peroneal and Tibial normal   Assessment:    2 Days Post-Op  Procedure(s) (LRB): OPEN REDUCTION INTERNAL FIXATION (ORIF) TIBIAL PLATEAU (Left)  ADDITIONAL DIAGNOSIS:  Principal Problem:   Fracture of femoral neck, right (HCC) Active Problems:   ESRD (end stage renal disease) (HCC)   Closed fracture of left proximal tibia     Plan: Physical Therapy as ordered NWB LLE, WBAT RLE  DVT Prophylaxis:  Heparin  Continue therapy, ortho problems stable, will continue to follow     Donia Ast 10/11/2020, 7:57 AM

## 2020-10-11 NOTE — Plan of Care (Signed)
  Problem: Pain Management: Goal: Pain level will decrease Outcome: Progressing   Problem: Activity: Goal: Ability to ambulate and perform ADLs will improve Outcome: Progressing   

## 2020-10-11 NOTE — Progress Notes (Addendum)
HD#9 Subjective:   Febrile this morning--Tmax 101.43F. Otherwise no significant overnight events. Denies cough, shortness of breath, abdominal pain, or worsening pain at surgical sites.   Objective:   Vital signs in last 24 hours: Vitals:   10/10/20 1330 10/10/20 1357 10/10/20 2048 10/11/20 0414  BP: 124/66 (!) 114/58 (!) 107/54 (!) 124/59  Pulse:  61 62 64  Resp:  '16 18 18  '$ Temp:  98.7 F (37.1 C) 99.2 F (37.3 C) (!) 101.2 F (38.4 C)  TempSrc:  Oral Oral Oral  SpO2:  98% 98% 99%  Weight:      Height:       Physical Exam General: well appearing in NAD Cardiac: RRR Pulm: breathing comfortably on room air. Lungs clear throughout. GI: soft, non-tender, hypoactive bowel sounds Skin: clean and intact dressing to right hip surgical site without surrounding erythema or edema. Compression wrap present on the left lower extremity overlying the surgical incision site which I was not able to take down due to pain. No erythema, edema or tenderness over the left subclavian line.  Pertinent Labs:   Assessment/Plan:   Principal Problem:   Fracture of femoral neck, right (HCC) Active Problems:   ESRD (end stage renal disease) ([place])   Closed fracture of left proximal tibia   Patient Summary:  [name] [name] is a 55 y.o. woman with history of ESRD on HD MWF, diabetes type 2, hypertension, NAFLD, GERD, history of recurrent UTIs, and chronic bilateral lower extremity weakness who presented after a fall and found to have a R femoral neck fracture. She underwent percutaneous pinning on 10/02/20. On 10/07/20 she was found to have a right tibia and fibular fracture and subsequently underwent an ORIF on 4/7. She is now awaiting placedment at a skilled facility for ongoing rehabilitation.  This is hospital day 9.  Right femoral neck fracture s/p percutaneous pinning 3/31  Mobility restrictions: WBAT for transfers only; unrestricted ROM R hip and knee R proximal tibia and fibular fractures  s/p ORIF 4/7. Per ortho, suspect patient had stress fracture that completed upon weight-bearing activity.  Mobility restrictions: NWB on L leg, unrestricted ROM L knee, no bracing Therapy: PT/OT Dressing changes prn Pain management: oxycodone 7.5 mg q4h prn, oxy 5 mg q6h, lidocaine patch. Follow-up needs: suture removal for R hip ~4/14  Post operative anemia on anemia of CKD. Continue to monitor.  Constipation. Hospital day #9 without a bowel movement. Likely multifactorial including opioids, post surgical, decreased activity. No results from the miralax, senna, or the suppository yesterday. She denies nausea or vomiting and still does have bowel sounds so will defer KUB for now. I discussed with her and bedside RN that, if she doesn't have results by noon, will plan for an enema which she is agreeable to.  Fever. Initial fever on admission which had resolved. Blood cultures had been obtained and were NG at 5d.  Temp 101.2 again this morning. No leukocytosis or symptoms to suspect infection at this time. I evaluated the area around the right hip which did not appear infected. Due to the nature of the dressing on the left leg, I am unable to evaluate this but she will supposedly have this changed today.  Lines: left subclavian HD line without surrounding erythema or edema. Anuric so no foley catheterization this admission. -will monitor for now -if she re-fevers, will obtain blood cultures  ESRD on HD MWF. Management per nephrology.  -Uses left subclavian HD line. Unfortunately, there has been difficulty  using this at HD over the past couple of days. IR was consulted and plans to exchange the catheter on Monday. Pt requesting anesthesia so will need to be NPO tomorrow at midnight. -will need to monitor fluid status closely  Type 2 diabetes mellitus. CBGs at goal.  -Continue lantus 5 QHS and SSI.  Hypertension. Normotensive.  -Continue amlodipine, hydralazine, imdur, coreg.  VTE:  Heparin Code: Full Dispo: SNF for rehab  Please contact the on call pager after 5 pm and on weekends at [phone].  Mitzi Hansen, MD Internal Medicine Resident PGY-2 [facility] Pager: [phone] 10/11/2020 9:18 AM

## 2020-10-11 NOTE — Progress Notes (Signed)
Orthopedic Tech Progress Note Patient Details:  [name] [name] 1966-04-21 [id number]  Ortho Devices Type of Ortho Device: Prafo boot/shoe Ortho Device/Splint Location: BLE Ortho Device/Splint Interventions: Ordered,Application,Adjustment   Post Interventions Patient Tolerated: Well Instructions Provided: Care of device,Adjustment of device,Poper ambulation with device   [name] 10/11/2020, 10:31 AM

## 2020-10-12 ENCOUNTER — Inpatient Hospital Stay (HOSPITAL_COMMUNITY): Payer: Medicare Other

## 2020-10-12 DIAGNOSIS — R509 Fever, unspecified: Secondary | ICD-10-CM | POA: Diagnosis not present

## 2020-10-12 DIAGNOSIS — S82191A Other fracture of upper end of right tibia, initial encounter for closed fracture: Secondary | ICD-10-CM | POA: Diagnosis not present

## 2020-10-12 DIAGNOSIS — S72001A Fracture of unspecified part of neck of right femur, initial encounter for closed fracture: Secondary | ICD-10-CM | POA: Diagnosis not present

## 2020-10-12 DIAGNOSIS — S82451A Displaced comminuted fracture of shaft of right fibula, initial encounter for closed fracture: Secondary | ICD-10-CM | POA: Diagnosis not present

## 2020-10-12 LAB — RENAL FUNCTION PANEL
Albumin: 2.4 g/dL — ABNORMAL LOW (ref 3.5–5.0)
Anion gap: 11 (ref 5–15)
BUN: 38 mg/dL — ABNORMAL HIGH (ref 6–20)
CO2: 21 mmol/L — ABNORMAL LOW (ref 22–32)
Calcium: 8.4 mg/dL — ABNORMAL LOW (ref 8.9–10.3)
Chloride: 99 mmol/L (ref 98–111)
Creatinine, Ser: 5.2 mg/dL — ABNORMAL HIGH (ref 0.44–1.00)
GFR, Estimated: 9 mL/min — ABNORMAL LOW (ref >60)
Glucose, Bld: 90 mg/dL (ref 70–99)
Phosphorus: 3.4 mg/dL (ref 2.5–4.6)
Potassium: 5.2 mmol/L — ABNORMAL HIGH (ref 3.5–5.1)
Sodium: 131 mmol/L — ABNORMAL LOW (ref 135–145)

## 2020-10-12 LAB — GLUCOSE, CAPILLARY
Glucose-Capillary: 85 mg/dL (ref 70–99)
Glucose-Capillary: 117 mg/dL — ABNORMAL HIGH (ref 70–99)
Glucose-Capillary: 118 mg/dL — ABNORMAL HIGH (ref 70–99)
Glucose-Capillary: 132 mg/dL — ABNORMAL HIGH (ref 70–99)

## 2020-10-12 LAB — CBC
HCT: 24.6 % — ABNORMAL LOW (ref 36.0–46.0)
Hemoglobin: 7.7 g/dL — ABNORMAL LOW (ref 12.0–15.0)
MCH: 27.4 pg (ref 26.0–34.0)
MCHC: 31.3 g/dL (ref 30.0–36.0)
MCV: 87.5 fL (ref 80.0–100.0)
Platelets: 199 10*3/uL (ref 150–400)
RBC: 2.81 MIL/uL — ABNORMAL LOW (ref 3.87–5.11)
RDW: 17.7 % — ABNORMAL HIGH (ref 11.5–15.5)
WBC: 9 10*3/uL (ref 4.0–10.5)
nRBC: 0 % (ref 0.0–0.2)

## 2020-10-12 MED ORDER — SODIUM ZIRCONIUM CYCLOSILICATE 10 G PO PACK
10.0000 g | PACK | Freq: Once | ORAL | Status: AC
  Administered 2020-10-12: 10 g via ORAL
  Filled 2020-10-12: qty 1

## 2020-10-12 NOTE — Progress Notes (Signed)
    HD#10 Subjective:   Afebrile overnight. Feeling ok this morning. Feels pain is being fairly well managed with current meds.  12.'5mg'$  oxycodone total over 24h.  She asked me to review her films of the fractures with her which we did.   Objective:   Vital signs in last 24 hours: Vitals:   10/11/20 1731 10/11/20 2015 10/12/20 0522 10/12/20 0956  BP: (!) 91/57 (!) 110/53 (!) 107/47 (!) 102/57  Pulse: 80 (!) 57 62 (!) 56  Resp: '16 15 17   '$ Temp: 98 F (36.7 C) 99 F (37.2 C) 98.7 F (37.1 C) 99.3 F (37.4 C)  TempSrc: Axillary Oral Oral Oral  SpO2: 98% 97% 99% 100%  Weight:      Height:       Physical Exam General: well appearing, in NAD Skin: no erythema or edema overlying the left subclavian line or right hip's surgical incision. Intact compression wrap to the left foot.  Assessment/Plan:   Principal Problem:   Fracture of femoral neck, right (HCC) Active Problems:   ESRD (end stage renal disease) ([place])   Closed fracture of left proximal tibia   Patient Summary:  [name] [name] is a 55 y.o. woman with history of ESRD on HD MWF, diabetes type 2, hypertension, NAFLD, GERD, history of recurrent UTIs, and chronic bilateral lower extremity weakness who presented after a fall and found to have a R femoral neck fracture. She underwent percutaneous pinning on 10/02/20. On 10/07/20 she was found to have a right tibia and fibular fracture and subsequently underwent an ORIF on 4/7. She is now awaiting placedment at a skilled facility for ongoing rehabilitation.  This is hospital day 10.  Right femoral neck fracture s/p percutaneous pinning 3/31  Mobility restrictions: WBAT for transfers only; unrestricted ROM R hip and knee R proximal tibia and fibular fractures s/p ORIF 4/7. Per ortho, suspect patient had stress fracture that completed upon weight-bearing activity.  Mobility restrictions: NWB on L leg, unrestricted ROM L knee, no bracing Therapy: PT/OT Dressing changes prn Pain  management: oxycodone 7.5 mg q4h prn, lidocaine patch. Follow-up needs: suture removal for R hip ~4/14  Post operative anemia on anemia of CKD. Continue to monitor.  Constipation resolved. BM 4/9. Continue bowel regimen.  Fever (4/8).  Afebrile overnight.  Blood cultures obtained on 4/9 in anticipation of cath exchange tomorrow. No growth so far. Will continue to follow VTE considered. She has been on prophylactic heparin however she does have risk factors including multiple surgeries and very limited activity. Will try to obtain LE duplexes however may be complicated by pain and limited mobility  ESRD on HD MWF. Management per nephrology.  -Uses left subclavian HD line. Unfortunately, there has been difficulty using this at HD over the past couple of days. IR was consulted and plans to exchange the catheter on Monday. Pt requesting anesthesia so will need to be NPO tonight.  Type 2 diabetes mellitus. Glucoses are mainly <100. Will discontinue insulin to avoid hypoglycemia and in anticipation of NPO at [place].   Hypertension. Normotensive.  -Continue amlodipine, hydralazine, imdur.  -mildy bradycardic so will hold coreg in the setting of normotension -can consider holding hydralazine if blood pressures decrease  VTE: Heparin Code: Full Dispo: SNF for rehab  Please contact the on call pager after 5 pm and on weekends at [phone].  Mitzi Hansen, MD Internal Medicine Resident PGY-2 [facility] Pager: [phone] 10/12/2020 11:44 AM

## 2020-10-12 NOTE — Progress Notes (Signed)
[facility] Progress Note   Subjective:  Tmax 99.3 yesterday. Blood cultures negative.  Objective Vitals:   10/11/20 1731 10/11/20 2015 10/12/20 0522 10/12/20 0956  BP: (!) 91/57 (!) 110/53 (!) 107/47 (!) 102/57  Pulse: 80 (!) 57 62 (!) 56  Resp: '16 15 17   '$ Temp: 98 F (36.7 C) 99 F (37.2 C) 98.7 F (37.1 C) 99.3 F (37.4 C)  TempSrc: Axillary Oral Oral Oral  SpO2: 98% 97% 99% 100%  Weight:      Height:       Physical Exam Gen: sleepy, sitting in bed HEENT: EOMI PERRL  CV:  RRR Lungs: clear ant Extr:  No edema Neuro:  nonfocal Skin: LIJ TDC c/d/i  Additional Objective Labs: Basic Metabolic Panel: Recent Labs  Lab 10/10/20 0342 10/11/20 0201 10/12/20 0244  NA 133* 133* 131*  K 4.5 4.0 5.2*  CL 97* 100 99  CO2 27 27 21*  GLUCOSE 167* 120* 90  BUN 35* 27* 38*  CREATININE 5.22* 3.89* 5.20*  CALCIUM 8.8* 8.4* 8.4*  PHOS 5.0* 3.0 3.4   Liver Function Tests: Recent Labs  Lab 10/10/20 0342 10/11/20 0201 10/12/20 0244  ALBUMIN 2.8* 2.6* 2.4*   No results for input(s): LIPASE, AMYLASE in the last 168 hours. CBC: Recent Labs  Lab 10/07/20 0313 10/08/20 0542 10/09/20 0619 10/10/20 0651 10/11/20 0201  WBC 5.1 6.2 6.4 6.4 7.3  NEUTROABS 2.1 2.2 2.5  --  2.8  HGB 9.0* 9.6* 9.1* 9.7* 8.2*  HCT 28.7* 30.2* 29.0* 31.0* 25.7*  MCV 87.5 88.3 89.0 87.6 87.7  PLT 155 195 152 201 177   Blood Culture    Component Value Date/Time   SDES BLOOD RIGHT HAND 10/11/2020 1219   SPECREQUEST  10/11/2020 1219    BOTTLES DRAWN AEROBIC ONLY Blood Culture results may not be optimal due to an inadequate volume of blood received in culture bottles   CULT  10/11/2020 1219    NO GROWTH < 24 HOURS Performed at [facility], [place] [address] [place], [place] [address]    REPTSTATUS PENDING 10/11/2020 1219    Cardiac Enzymes: No results for input(s): CKTOTAL, CKMB, CKMBINDEX, TROPONINI in the last 168 hours. CBG: Recent Labs  Lab 10/11/20 0704  10/11/20 1307 10/11/20 1721 10/11/20 2018 10/12/20 0636  GLUCAP 115* 131* 76 91 85   Iron Studies:  Recent Labs    10/10/20 0651  IRON 38  TIBC 182*  FERRITIN 625*   '@lablastinr3'$ @ Studies/Results: No results found. Medications: . sodium chloride 10 mL/hr at 10/09/20 1018  .  ceFAZolin (ANCEF) IV     . amLODipine  10 mg Oral Daily  . calcium carbonate  2 tablet Oral TID AC  . carvedilol  25 mg Oral BID  . Chlorhexidine Gluconate Cloth  6 each Topical Q0600  . Chlorhexidine Gluconate Cloth  6 each Topical Q0600  . darbepoetin (ARANESP) injection - DIALYSIS  150 mcg Intravenous Q Fri-HD  . heparin injection (subcutaneous)  5,000 Units Subcutaneous Q8H  . hydrALAZINE  50 mg Oral Q8H  . insulin aspart  0-9 Units Subcutaneous TID WC  . insulin glargine  5 Units Subcutaneous QHS  . isosorbide dinitrate  20 mg Oral TID  . lidocaine  1 patch Transdermal Q24H  . polyethylene glycol  17 g Oral BID  . senna-docusate  2 tablet Oral BID   HD orders:  Triad 226mn BFR 350 DFR 600 EDW 62kg 2K/2.5Ca F180, heparin 5000 bolus and 500/hr maint  Assessment/Plan **Hip fracture: s/p operative management per ortho.  PT eval to determine dispo.  Please avoid morphine in this ESRD patient.  No evidence of DVT although limited exam on 10/06/20  ** L prox tibia fracture- for ORIF tomorrow 10/09/20.    **ESRD on HD MWF:  s/p HD Friday per schedule.  Continue MWF schedule.  L IJ TDC hasn't been able to run properly the last 2 rx and had to have TPA.  Will ask IR to exchange nonurgently.  **BMM: clarified - she takes ca carbonate 2 TIDAC as binder, Phos - 6.5, resumed home binder; corr ca ok.   **DM: insulin per primary  **HTN: continue home meds; per outpt HD records frequent HTN.   **Anemia:  Hb 10.6 > 9.8 > 9.6. post op.  Trend. Check iron indices.   ** Fever: overnight 10/10/20.  Normally would not order cultures but since we are planning to exchange [facility] will do them to make sure she doesn't  have occult bacteremia (which I doubt).  NGTD 10/11/20.  LE dopplers pending  ** Hyperkalemia: K 5.2, lokelma today.    Madelon Lips MD 10/12/2020, 11:31 AM  [facility] Pager: [phone]

## 2020-10-12 NOTE — Plan of Care (Signed)
  Problem: Activity: Goal: Ability to ambulate and perform ADLs will improve Outcome: Progressing   Problem: Pain Managment: Goal: General experience of comfort will improve Outcome: Progressing

## 2020-10-12 NOTE — CV Procedure (Signed)
BLE venous duplex completed.  Results can be found under chart review under CV PROC. 10/12/2020 1:48 PM Lezley Bedgood RVT, RDMS

## 2020-10-13 ENCOUNTER — Inpatient Hospital Stay (HOSPITAL_COMMUNITY): Payer: Medicare Other

## 2020-10-13 ENCOUNTER — Telehealth (HOSPITAL_COMMUNITY): Payer: Self-pay | Admitting: *Deleted

## 2020-10-13 DIAGNOSIS — M810 Age-related osteoporosis without current pathological fracture: Secondary | ICD-10-CM

## 2020-10-13 HISTORY — PX: IR PTA VENOUS EXCEPT DIALYSIS CIRCUIT: IMG6126

## 2020-10-13 HISTORY — PX: IR FLUORO GUIDE CV LINE LEFT: IMG2282

## 2020-10-13 LAB — RENAL FUNCTION PANEL
Albumin: 2.4 g/dL — ABNORMAL LOW (ref 3.5–5.0)
Anion gap: 6 (ref 5–15)
BUN: 45 mg/dL — ABNORMAL HIGH (ref 6–20)
CO2: 26 mmol/L (ref 22–32)
Calcium: 8.4 mg/dL — ABNORMAL LOW (ref 8.9–10.3)
Chloride: 97 mmol/L — ABNORMAL LOW (ref 98–111)
Creatinine, Ser: 6.47 mg/dL — ABNORMAL HIGH (ref 0.44–1.00)
GFR, Estimated: 7 mL/min — ABNORMAL LOW (ref >60)
Glucose, Bld: 149 mg/dL — ABNORMAL HIGH (ref 70–99)
Phosphorus: 4 mg/dL (ref 2.5–4.6)
Potassium: 4.5 mmol/L (ref 3.5–5.1)
Sodium: 129 mmol/L — ABNORMAL LOW (ref 135–145)

## 2020-10-13 LAB — GLUCOSE, CAPILLARY
Glucose-Capillary: 137 mg/dL — ABNORMAL HIGH (ref 70–99)
Glucose-Capillary: 127 mg/dL — ABNORMAL HIGH (ref 70–99)
Glucose-Capillary: 133 mg/dL — ABNORMAL HIGH (ref 70–99)

## 2020-10-13 LAB — CBC
HCT: 24.9 % — ABNORMAL LOW (ref 36.0–46.0)
Hemoglobin: 7.8 g/dL — ABNORMAL LOW (ref 12.0–15.0)
MCH: 27.5 pg (ref 26.0–34.0)
MCHC: 31.3 g/dL (ref 30.0–36.0)
MCV: 87.7 fL (ref 80.0–100.0)
Platelets: 227 10*3/uL (ref 150–400)
RBC: 2.84 MIL/uL — ABNORMAL LOW (ref 3.87–5.11)
RDW: 17.8 % — ABNORMAL HIGH (ref 11.5–15.5)
WBC: 9.3 10*3/uL (ref 4.0–10.5)
nRBC: 0 % (ref 0.0–0.2)

## 2020-10-13 MED ORDER — LIDOCAINE HCL 1 % IJ SOLN
INTRAMUSCULAR | Status: AC
  Filled 2020-10-13: qty 20

## 2020-10-13 MED ORDER — OXYCODONE HCL 5 MG PO TABS
5.0000 mg | ORAL_TABLET | ORAL | Status: DC | PRN
  Administered 2020-10-13 – 2020-10-14 (×2): 5 mg via ORAL
  Filled 2020-10-13 (×2): qty 1

## 2020-10-13 MED ORDER — LIDOCAINE HCL (PF) 1 % IJ SOLN
INTRAMUSCULAR | Status: AC | PRN
  Administered 2020-10-13: 30 mL

## 2020-10-13 MED ORDER — HEPARIN SODIUM (PORCINE) 1000 UNIT/ML IJ SOLN
INTRAMUSCULAR | Status: AC
  Filled 2020-10-13: qty 1

## 2020-10-13 MED ORDER — IOHEXOL 300 MG/ML  SOLN
50.0000 mL | Freq: Once | INTRAMUSCULAR | Status: AC | PRN
  Administered 2020-10-13: 10 mL

## 2020-10-13 MED ORDER — CEFAZOLIN SODIUM-DEXTROSE 2-4 GM/100ML-% IV SOLN
INTRAVENOUS | Status: AC
  Filled 2020-10-13: qty 100

## 2020-10-13 MED ORDER — CEFAZOLIN SODIUM-DEXTROSE 2-4 GM/100ML-% IV SOLN
2.0000 g | Freq: Once | INTRAVENOUS | Status: AC
  Administered 2020-10-13: 2 g via INTRAVENOUS
  Filled 2020-10-13: qty 100

## 2020-10-13 NOTE — Progress Notes (Addendum)
[facility] Progress Note   HD orders:  Triad 248mn BFR 350 DFR 600 EDW 62kg 2K/2.5Ca F180, heparin 5000 bolus and 500/hr maint  Assessment/ Plan:   **Hip fracture: s/p operative management per ortho.  PT eval to determine dispo. Please avoid morphine in this ESRD patient. No evidence of DVT although limited exam on 10/06/20  ** L prox tibia fracture- ORIF bicondylar tibial plateau 10/09/20.    **ESRD on HD MWF: s/p HD Friday per schedule.  Continue MWF schedule.  L IJ TDC hasn't been able to run properly the last 2 rx and had to have TPA.   IR to exchange today. HD today  - She is lethargic today but this is an emergency and she requires a cathteter exchange today as the past 2 HD treatments have been short and likely with very poor clearances secondary to catheter malfunction. Uremia may also be contributing to the AMS.  **BMM: clarified - she takes ca carbonate 2 TIDAC as binder, Phos - 6.5, resumed home binder; corr ca ok.   **DM: insulin per primary  **HTN: continue home meds; per outpt HD records frequent HTN.   **Anemia:  Hb 10.6 > 9.8 > 9.6. post op.  Trend. Check iron indices.   ** Fever: overnight 10/10/20.  Normally would not order [facility] sure [facility] doesn't have occult bacteremia (which I doubt).  NGTD 10/11/20.  LE dopplers neg for DVT  ** Hyperkalemia: K 5.2  Subjective:   No fevers overnight; blood cultures negative to date. She's lethargic but denies f/c/n/v/dyspnea   Objective:   BP (!) 114/57   Pulse 60   Temp 98.9 F (37.2 C) (Oral)   Resp 16   Ht 5' (1.524 m)   Wt 64.7 kg   SpO2 97%   BMI 27.86 kg/m  No intake or output data in the 24 hours ending 10/13/20 1221 Weight change:   Physical Exam: [id number] sitting in bed and arousable but certainly lethargic CV:RRR Lungs: clear ant Extr: No edema Neuro: nonfocal Skin:LIJTDCc/d/i   Imaging: VAS [place]OWER  EXTREMITY VENOUS (DVT)  Result Date: 10/12/2020  Lower Venous DVT Study Indications: Fever.  Risk Factors: Trauma Recent fall. Comparison Study: Previous exam 10/06/20 - negative Performing Technologist: JRogelia Rohrer Examination Guidelines: A complete evaluation includes B-mode imaging, spectral Doppler, color Doppler, and power Doppler as needed of all accessible portions of each vessel. Bilateral testing is considered an integral part of a complete examination. Limited examinations for reoccurring indications may be performed as noted. The reflux portion of the exam is performed with the patient in reverse Trendelenburg.  +---------+---------------+---------+-----------+----------+--------------+ RIGHT    CompressibilityPhasicitySpontaneityPropertiesThrombus Aging +---------+---------------+---------+-----------+----------+--------------+ CFV      Full           Yes      Yes                                 +---------+---------------+---------+-----------+----------+--------------+ SFJ      Full                                                        +---------+---------------+---------+-----------+----------+--------------+ FV Prox  Full  Yes      Yes                                 +---------+---------------+---------+-----------+----------+--------------+ FV Mid   Full           Yes      Yes                                 +---------+---------------+---------+-----------+----------+--------------+ FV DistalFull           Yes      Yes                                 +---------+---------------+---------+-----------+----------+--------------+ PFV      Full                                                        +---------+---------------+---------+-----------+----------+--------------+ POP      Full           Yes      Yes                                 +---------+---------------+---------+-----------+----------+--------------+ PTV      Full                                                         +---------+---------------+---------+-----------+----------+--------------+ PERO     Full                                                        +---------+---------------+---------+-----------+----------+--------------+   +---------+---------------+---------+-----------+----------+-------------------+ LEFT     CompressibilityPhasicitySpontaneityPropertiesThrombus Aging      +---------+---------------+---------+-----------+----------+-------------------+ CFV      Full           Yes      Yes                                      +---------+---------------+---------+-----------+----------+-------------------+ SFJ      Full                                                             +---------+---------------+---------+-----------+----------+-------------------+ FV Prox  Full           Yes      Yes                                      +---------+---------------+---------+-----------+----------+-------------------+ FV Mid  Full           Yes      Yes                  Still compression -                                                       due to pain                                                               intolerance         +---------+---------------+---------+-----------+----------+-------------------+ FV Distal               Yes      Yes                  Patent by color and                                                       doppler             +---------+---------------+---------+-----------+----------+-------------------+ PFV      Full                                                             +---------+---------------+---------+-----------+----------+-------------------+ POP                                                   Not seen - bandage  +---------+---------------+---------+-----------+----------+-------------------+ PTV                                                    Not seen - bandage  +---------+---------------+---------+-----------+----------+-------------------+ PERO                                                  Not seen - bandage  +---------+---------------+---------+-----------+----------+-------------------+   Left Technical Findings: Not visualized segments include Pop V, PTV, and Pero V. Unable to image the above due to bandage to LLE from knee to ankle. Compressions limited to pain intolerance.   Summary: BILATERAL: - No evidence of deep vein thrombosis seen in the lower extremities, bilaterally. - No evidence of superficial venous thrombosis in the lower extremities, bilaterally. -No evidence of popliteal cyst, bilaterally.  LEFT: - Portions of this examination were limited- see technologist comments above.  *  See table(s) above for measurements and observations. Electronically signed by Deitra Mayo MD on 10/12/2020 at 4:05:53 PM.    Final     Labs: BMET Recent Labs  Lab 10/07/20 [id number] 10/08/20 0542 10/09/20 [id number] 10/10/20 0342 10/11/20 0201 10/12/20 0244 10/13/20 0246  NA 133* 135 132* 133* 133* 131* 129*  K 4.2 4.8 4.2 4.5 4.0 5.2* 4.5  CL 97* 96* 98 97* 100 99 97*  CO2 '27 28 27 27 27 '$ 21* 26  GLUCOSE 167* 114* 93 167* 120* 90 149*  BUN 22* 38* 23* 35* 27* 38* 45*  CREATININE 4.34* 5.87* 3.98* 5.22* 3.89* 5.20* 6.47*  CALCIUM 8.3* 8.9 8.6* 8.8* 8.4* 8.4* 8.4*  PHOS 3.4 3.9 3.7 5.0* 3.0 3.4 4.0   CBC Recent Labs  Lab 10/07/20 0313 10/08/20 0542 10/09/20 0619 10/10/20 0651 10/11/20 0201 10/12/20 1246 10/13/20 0246  WBC 5.1 6.2 6.4 6.4 7.3 9.0 9.3  NEUTROABS 2.1 2.2 2.5  --  2.8  --   --   HGB 9.0* 9.6* 9.1* 9.7* 8.2* 7.7* 7.8*  HCT 28.7* 30.2* 29.0* 31.0* 25.7* 24.6* 24.9*  MCV 87.5 88.3 89.0 87.6 87.7 87.5 87.7  PLT 155 195 152 201 177 199 227    Medications:    . amLODipine  10 mg Oral Daily  . calcium carbonate  2 tablet Oral TID AC  . Chlorhexidine Gluconate Cloth  6 each Topical Q0600  .  Chlorhexidine Gluconate Cloth  6 each Topical Q0600  . darbepoetin (ARANESP) injection - DIALYSIS  150 mcg Intravenous Q Fri-HD  . heparin injection (subcutaneous)  5,000 Units Subcutaneous Q8H  . isosorbide dinitrate  20 mg Oral TID  . lidocaine  1 patch Transdermal Q24H  . polyethylene glycol  17 g Oral BID  . senna-docusate  2 tablet Oral BID      Otelia Santee, MD 10/13/2020, 12:21 PM

## 2020-10-13 NOTE — Progress Notes (Addendum)
    HD#11 Subjective:   No acute events overnight. During evaluation this morning, patient is sleeping initially, however arouses to voice. She states she is sleepy, reports having slept well last night. States her pain is well-controlled at this time. On chart review, received 20 mg oxycodone total over 24h, most recently approximately 3 hours prior to our evaluation.   Objective:   Vital signs in last 24 hours: Vitals:   10/12/20 0956 10/12/20 1323 10/12/20 1616 10/12/20 2259  BP: (!) 102/57 (!) 100/51 (!) 104/53 (!) 115/55  Pulse: (!) 56 (!) 56 (!) 58 60  Resp:  '16 14 18  '$ Temp: 99.3 F (37.4 C) 98.7 F (37.1 C) 99 F (37.2 C) 98.6 F (37 C)  TempSrc: Oral Oral Oral Oral  SpO2: 100% 99% 95% 100%  Weight:      Height:       Physical Exam General: well appearing, in NAD, drowsy Skin: no erythema or edema overlying the left subclavian line or right hip's surgical incision. Intact compression wrap to the left foot. Extremities: wiggles toes bilaterally  Assessment/Plan:   Principal Problem:   Fracture of femoral neck, right (HCC) Active Problems:   ESRD (end stage renal disease) (HCC)   Closed fracture of left proximal tibia   Fever   Osteoporosis   Patient Summary:  [name] [name] is a 55 y.o. woman with history of ESRD on HD MWF, diabetes type 2, hypertension, NAFLD, GERD, history of recurrent UTIs, and chronic bilateral lower extremity weakness who presented after a fall and found to have a R femoral neck fracture. She underwent percutaneous pinning on 10/02/20. On 10/07/20 she was found to have a right tibia and fibular fracture and subsequently underwent an ORIF on 4/7. She is now awaiting placement at a skilled facility for ongoing rehabilitation.  This is hospital day 11.  Right femoral neck fracture s/p percutaneous pinning 3/31  Mobility restrictions: WBAT for transfers only; unrestricted ROM R hip and knee R proximal tibia and fibular fractures s/p ORIF 4/7. Per  ortho, suspect patient had stress fracture that completed upon weight-bearing activity. Will deescalate pain regimen as below.  Mobility restrictions: NWB on L leg, unrestricted ROM L knee, no bracing Therapy: PT/OT Dressing changes prn Pain management: oxycodone 5 mg q4h prn, lidocaine patch. Follow-up needs: suture removal for R hip ~4/14  ESRD on HD MWF Osteoporosis Management per nephrology.  - Uses left subclavian HD line. Catheter exchange today.   Post operative anemia on anemia of CKD. Continue to monitor.  Constipation resolved. BM 4/9. Continue bowel regimen.  Fever (4/9 AM).  Afebrile since. Blood cultures obtained on 4/9 in anticipation of cath exchange today. No growth so far. Will continue to follow. LE duplex obtained to rule out DVT negative.   Type 2 diabetes mellitus. Glucoses have been at goal even since discontinuing insulin (Lantus 5 u). Will continue to hold at this time and consider resumption as needed.   Essential hypertension. BP soft overnight and this morning.  - Continue amlodipine, imdur.  - Hold hydralazine in setting of soft BP. - Holding Coreg in setting of mild bradycardia and normotension.   VTE: Heparin Code: Full Dispo: SNF for rehab  Alexandria Lodge, MD Internal Medicine Resident, PGY-1 [facility] Pager: [phone] 6:20 AM, 10/13/2020   Please contact the on call pager after 5 pm and on weekends at [phone].

## 2020-10-13 NOTE — Social Work (Addendum)
CSW attempted to call family due to RN reporting that patient could not give consent for her procedure at this time. CSW called the numbers listed for son, Omar Person, and spouse, Aletia Tobar.  CSW unable to leave a message with Mr. Gilford Rile, but left VM with Mr. Weisler requesting a returned call.    15:49-  CSW verified that the patient is currently receiving HD at [facility] on MWF with an arrival time of 11:30 and a seat time of 11:50.  CSW spoke with [facility] and was informed that the patient has to be reinstated to use transpiration services due to an extended time passing w/o the patient accessing services.  After further inquiry CSW was informed that [facility] will not transport the patient to [facility].  CSW will follow up with Renal navigator tomorrow upon their return.  16:18- CSW spoke with Renal navigator, Terri Piedra.  CSW was informed that the patient being reinstated with [facility] could take up to 21 days, but the other agencies who offered could provide transportation to and from HD   CSW spoke with patient and gave other bed offers.  Patient does not want to go to [facility] or [facility] as she has been to those facilities previously.  Patient reports that her son's transportation agency may be able to transport.  Patient called son to verify and did not receive an answer.   CSW will follow up with patient tomorrow tomorrow about son transporting.

## 2020-10-13 NOTE — Sedation Documentation (Signed)
No sedation to be given. Patient is lethargic and not answering questions appropriately and cannot consent herself for procedure at this time. 2 Physician emergency consent obtained due to inability to reach family. Patient attached to monitor and antibiotics to be started.

## 2020-10-13 NOTE — Plan of Care (Signed)
  Problem: Activity: Goal: Risk for activity intolerance will decrease Outcome: Progressing   Problem: Coping: Goal: Level of anxiety will decrease Outcome: Progressing   Problem: Pain Managment: Goal: General experience of comfort will improve Outcome: Progressing   Problem: Safety: Goal: Ability to remain free from injury will improve Outcome: Progressing   Problem: Skin Integrity: Goal: Risk for impaired skin integrity will decrease Outcome: Progressing   

## 2020-10-13 NOTE — Procedures (Signed)
Interventional Radiology Procedure Note  Procedure:   Limited venogram of LUE & central vasculature for non-functional HD catheter. PTA of fibrin sheath Exchange of left IJ tunneled HD catheter, with new 23cm tip to cuff catheter  Complications: None  Recommendations:  - Ok to use - Do not submerge - Routine line care   Signed,  Dulcy Fanny. Earleen Newport, DO

## 2020-10-13 NOTE — Progress Notes (Signed)
Physical Therapy Treatment Patient Details Name: MECHELE LOVINGER MRN: [id number] DOB: 1966-01-20 Today's Date: 10/13/2020    History of Present Illness 55 year old female adm with  R femoral neck fx s/p precutaneoius pinnning on 3/31. Underwent ORIF of L tibial plateau on 10/09/2020. PMH: ESRD on HD, HTN, syncope and collapse, diabetic neuropathy, mitral regurgitation, impaired vision, HTN, heart murmur, colitis, and DM.    PT Comments    Pt supine in bed positioned toward bottom of bed.  Focused on LE strengthening as patient very lethargic this session.  Pt has difficulty keeping eyes open.  Increased time to perform AAROM and then even need PROM to complete reps.  PTA focused on positioning post session and adjusting where she was in the bed as well as donning B PRAFOs to avoid contractures.      Follow Up Recommendations  SNF     Equipment Recommendations  None recommended by PT    Recommendations for Other Services       Precautions / Restrictions Precautions Precautions: Fall Other Brace: B PRAFO when in resting position. Restrictions Weight Bearing Restrictions: Yes RLE Weight Bearing: Weight bearing as tolerated RLE Partial Weight Bearing Percentage or Pounds: for transfers only. LLE Weight Bearing: Non weight bearing    Mobility  Bed Mobility               General bed mobility comments: Total +2 to boost to [facility].  Donned B PRAFO to prevent ER and promoted dorsiflexion of B ankles.    Transfers                    Ambulation/Gait                 Stairs             Wheelchair Mobility    Modified Rankin (Stroke Patients Only)       Balance                                            Cognition Arousal/Alertness: Lethargic;Suspect due to medications Behavior During Therapy: Flat affect Overall Cognitive Status: Difficult to assess (due to medications on board.)                                         Exercises General Exercises - Lower Extremity Ankle Circles/Pumps: AAROM;PROM;10 reps;Supine Quad Sets:  (unable to contract due to lethargic presentation.) Short Arc Quad: AAROM;PROM;10 reps;Supine Heel Slides: AAROM;PROM;Both;10 reps;Supine Hip ABduction/ADduction: AAROM;PROM;Both;10 reps;Supine    General Comments        Pertinent Vitals/Pain Pain Assessment: Faces Faces Pain Scale: No hurt (likely due to pain meds, very drowsy and sleepy, falling asleeping during session.)    Home Living                      Prior Function            PT Goals (current goals can now be found in the care plan section) Acute Rehab PT Goals Patient Stated Goal: to eventually return home Potential to Achieve Goals: Fair Progress towards PT goals: Progressing toward goals    Frequency    Min 3X/week      PT Plan Current plan remains appropriate  Co-evaluation              AM-PAC PT "6 Clicks" Mobility   Outcome Measure  Help needed turning from your back to your side while in a flat bed without using bedrails?: Total Help needed moving from lying on your back to sitting on the side of a flat bed without using bedrails?: Total Help needed moving to and from a bed to a chair (including a wheelchair)?: Total Help needed standing up from a chair using your arms (e.g., wheelchair or bedside chair)?: Total Help needed to walk in hospital room?: Total Help needed climbing 3-5 steps with a railing? : Total 6 Click Score: 6    End of Session   Activity Tolerance: Patient limited by lethargy Patient left: in bed;with call bell/phone within reach;with bed alarm set Nurse Communication: Mobility status;Need for lift equipment (educated RN staff to avoid placing object under L knee when resting to prevent contracture.  Locked out bed to avoid this.) PT Visit Diagnosis: Other abnormalities of gait and mobility (R26.89);History of falling (Z91.81);Pain Pain - Right/Left:  Right Pain - part of body: Hip     Time: 1146-1209 PT Time Calculation (min) (ACUTE ONLY): 23 min  Charges:  $Therapeutic Exercise: 8-22 mins $Therapeutic Activity: 8-22 mins                     Erasmo Leventhal , PTA Acute Rehabilitation Services Pager [phone] Office [phone]     Janeshia Ciliberto Eli Hose 10/13/2020, 4:09 PM

## 2020-10-13 NOTE — Progress Notes (Signed)
Patient brought to Radiology for tunneled catheter exchange.  She was last seen by our service on 4/8 when she was agreeable to procedure, however wanted to wait to sign consent after meeting performing MD.  Today, patient is lethargic.  She does arouse to voice and participates in conversation but quickly falls asleep mid sentence or thought.  Attempted to reach her family for consent, however no answer.  Spoke with Dr. Earleen Newport and Dr. Shon Baton who agree to emergent consent based on lab work and somnolence due to lack of dialysis.   Brynda Greathouse, MS RD PA-C 1:27 PM

## 2020-10-13 NOTE — Social Work (Signed)
CSW spoke with [name] [name], on call renal navigator.  Carleene Overlie was unsure of whether the clinic would need to be changed.  CSW will follow up with Terri Piedra (renal navigator) tomorrow.

## 2020-10-14 LAB — RENAL FUNCTION PANEL
Albumin: 2.3 g/dL — ABNORMAL LOW (ref 3.5–5.0)
Anion gap: 8 (ref 5–15)
BUN: 14 mg/dL (ref 6–20)
CO2: 28 mmol/L (ref 22–32)
Calcium: 7.9 mg/dL — ABNORMAL LOW (ref 8.9–10.3)
Chloride: 98 mmol/L (ref 98–111)
Creatinine, Ser: 2.77 mg/dL — ABNORMAL HIGH (ref 0.44–1.00)
GFR, Estimated: 20 mL/min — ABNORMAL LOW (ref >60)
Glucose, Bld: 120 mg/dL — ABNORMAL HIGH (ref 70–99)
Phosphorus: 2.2 mg/dL — ABNORMAL LOW (ref 2.5–4.6)
Potassium: 3.4 mmol/L — ABNORMAL LOW (ref 3.5–5.1)
Sodium: 134 mmol/L — ABNORMAL LOW (ref 135–145)

## 2020-10-14 LAB — CBC
HCT: 25.2 % — ABNORMAL LOW (ref 36.0–46.0)
Hemoglobin: 7.8 g/dL — ABNORMAL LOW (ref 12.0–15.0)
MCH: 28 pg (ref 26.0–34.0)
MCHC: 31 g/dL (ref 30.0–36.0)
MCV: 90.3 fL (ref 80.0–100.0)
Platelets: 192 10*3/uL (ref 150–400)
RBC: 2.79 MIL/uL — ABNORMAL LOW (ref 3.87–5.11)
RDW: 17.5 % — ABNORMAL HIGH (ref 11.5–15.5)
WBC: 10.5 10*3/uL (ref 4.0–10.5)
nRBC: 0 % (ref 0.0–0.2)

## 2020-10-14 LAB — GLUCOSE, CAPILLARY
Glucose-Capillary: 104 mg/dL — ABNORMAL HIGH (ref 70–99)
Glucose-Capillary: 127 mg/dL — ABNORMAL HIGH (ref 70–99)
Glucose-Capillary: 113 mg/dL — ABNORMAL HIGH (ref 70–99)

## 2020-10-14 MED ORDER — HEPARIN SODIUM (PORCINE) 1000 UNIT/ML IJ SOLN
INTRAMUSCULAR | Status: AC
  Filled 2020-10-14: qty 4

## 2020-10-14 MED ORDER — OXYCODONE HCL 5 MG PO TABS
5.0000 mg | ORAL_TABLET | Freq: Two times a day (BID) | ORAL | Status: DC | PRN
Start: 2020-10-14 — End: 2020-10-14

## 2020-10-14 MED ORDER — OXYCODONE HCL 5 MG PO TABS
2.5000 mg | ORAL_TABLET | Freq: Two times a day (BID) | ORAL | Status: DC | PRN
  Administered 2020-10-14 – 2020-10-16 (×3): 2.5 mg via ORAL
  Filled 2020-10-14 (×3): qty 1

## 2020-10-14 NOTE — Plan of Care (Signed)
  Problem: Education: Goal: Verbalization of understanding the information provided (i.e., activity precautions, restrictions, etc) will improve Outcome: Progressing   Problem: Activity: Goal: Ability to ambulate and perform ADLs will improve Outcome: Progressing   Problem: Clinical Measurements: Goal: Postoperative complications will be avoided or minimized Outcome: Progressing   Problem: Self-Concept: Goal: Ability to maintain and perform role responsibilities to the fullest extent possible will improve Outcome: Progressing   Problem: Pain Management: Goal: Pain level will decrease Outcome: Progressing   Problem: Clinical Measurements: Goal: Ability to maintain clinical measurements within normal limits will improve Outcome: Progressing

## 2020-10-14 NOTE — Progress Notes (Signed)
[facility] Progress Note   HD orders: [facility] 265mn BFR 350 DFR 600 EDW 62kg 2K/2.5Ca F180, heparin 5000 bolus and 500/hr maint  Assessment/ Plan:   **Hip fracture: s/p operative management per ortho. PT eval to determine dispo. Please avoid morphine in this ESRD patient. No evidence of DVT although limited exam on 10/06/20  ** L prox tibia fracture- ORIF bicondylar tibial plateau 10/09/20.   **ESRD on HD MWF: s/p HD Friday per schedule. Continue MWF schedule. L IJ TDC hasn't been able to run properly the last 2 rx and had to have TPA; appreciate  VIR  Exchanging it on 4/11 noted to have a fibrin sheath which was disrupted.  Tolerated HD overnight with 442 mL net UF removed.  Plan on next HD Wed.  - She is more alert today; very likely uremia was  contributing to the AMS.   **BMM: clarified - she takes ca carbonate 2 TIDAC as binder, Phos - 6.5, resumed home binder; corr ca ok.   **DM: insulin per primary  **HTN: continue home meds; per outpt HD records frequent HTN.   **Anemia: Hb 10.6 >9.8 >9.6. post op. Trend. Check iron indices.   ** Fever: overnight 10/10/20. Normally would not order cultures but [facility] to make sure she doesn't have occult bacteremia (which I doubt). NGTD 10/11/20. LE dopplers neg for DVT    Subjective:   No fevers overnight; blood cultures negative to date. She's more alert today after a full treatment of HD overnight.  denies f/c/n/v/dyspnea   Objective:   BP (!) 116/58 (BP Location: Left Arm)   Pulse 61   Temp 98.6 F (37 C) (Oral)   Resp 17   Ht 5' (1.524 m)   Wt 64.7 kg   SpO2 96%   BMI 27.86 kg/m   Intake/Output Summary (Last 24 hours) at 10/14/2020 [id number] data filed at 10/14/2020 [id number] per 24 hour  Intake 0 ml  Output 442 ml  Net -442 ml   Weight change:   Physical Exam: Gen: in bed and much more arousable today answering questions  appropriately CV:RRR Lungs: clear ant Extr: No edema Neuro:nonfocal Skin:LIJTDCc/d/i  Imaging: IR Fluoro Guide CV Line Left  Result Date: 10/13/2020 INDICATION: 55year old female referred for exchange of nonfunctional left IJ tunneled hemodialysis catheter. Report is that the catheter only works when tPA is infused. EXAM: VENOGRAM LEFT UPPER EXTREMITY AND CENTRAL VEINS BALLOON ANGIOPLASTY FIBRIN SHEATH EXCHANGE OF LEFT IJ TUNNELED HEMODIALYSIS CATHETER MEDICATIONS: 2 g Ancef. The antibiotic was given in an appropriate time interval prior to skin puncture. ANESTHESIA/SEDATION: Moderate Sedation Time: 0 minutes. The patient's level of consciousness and vital signs were monitored continuously by radiology nursing throughout the procedure under my direct supervision. FLUOROSCOPY TIME:  Fluoroscopy Time: 2 minutes 42 seconds (33 mGy). COMPLICATIONS: None PROCEDURE: Informed written consent was obtained from the patient after a discussion of the risks, benefits, and alternatives to treatment. Questions regarding the procedure were encouraged and answered. The left neck and chest including indwelling catheter were prepped with chlorhexidine in a sterile fashion, and a sterile drape was applied covering the operative field. Maximum barrier sterile technique with sterile gowns and gloves were used for the procedure. A timeout was performed prior to the initiation of the procedure. Initial scout images were acquired. Soft tissues were anesthetized with 1% lidocaine without epinephrine. Stiff Glidewire was then advanced through the left-sided catheter, navigated to the IVC under fluoroscopy. The  cuff was bluntly freed from the soft tissue tract and the catheter was withdrawn. The cuff immediately was freed from the soft tissues and withdrawn. It was evident that there was a long sleeve fibrin sheath attached to the cuff. This was sharply dissected. Once the old catheter was removed from the Glidewire, 25 cm  8 French sheath was placed. Venogram was performed. Balloon angioplasty was then performed of the fibrin sheath, 10 mm x 40 mm. Balloon was withdrawn and the debris from the catheter was removed. Repeat venogram confirmed flow with resolution of the fibrin sheath. A new 23 cm tip to cuff tunneled hemodialysis catheter was placed on the Glidewire. Glidewire was removed. Vigorous aspiration of both hubs confirmed adequate flow. Hep-Lock was placed through both hubs. Catheter was sutured in position. Final image was stored. Patient tolerated the procedure well and remained hemodynamically stable throughout. No complication and no significant blood loss. FINDINGS: Initial venogram demonstrates fibrin sheath within the brachiocephalic vein extending into the SVC. After balloon angioplasty there is resolution of the fibrin sheath. Functional hemodialysis catheter after placement. IMPRESSION: Status post exchange of left IJ tunneled hemodialysis catheter for a new 23 cm tip to cuff catheter, with balloon angioplasty of fibrin sheath. Signed, Dulcy Fanny. Dellia Nims, RPVI [facility] Radiology Electronically Signed   By: Corrie Mckusick D.O.   On: 10/13/2020 16:30   IR PTA VENOUS EXCEPT DIALYSIS CIRCUIT  Result Date: 10/13/2020 INDICATION: 55 year old female referred for exchange of nonfunctional left IJ tunneled hemodialysis catheter. Report is that the catheter only works when tPA is infused. EXAM: VENOGRAM LEFT UPPER EXTREMITY AND CENTRAL VEINS BALLOON ANGIOPLASTY FIBRIN SHEATH EXCHANGE OF LEFT IJ TUNNELED HEMODIALYSIS CATHETER MEDICATIONS: 2 g Ancef. The antibiotic was given in an appropriate time interval prior to skin puncture. ANESTHESIA/SEDATION: Moderate Sedation Time: 0 minutes. The patient's level of consciousness and vital signs were monitored continuously by radiology nursing throughout the procedure under my direct supervision. FLUOROSCOPY TIME:  Fluoroscopy Time:  2 minutes 42 seconds (33 mGy). COMPLICATIONS: None PROCEDURE: Informed written consent was obtained from the patient after a discussion of the risks, benefits, and alternatives to treatment. Questions regarding the procedure were encouraged and answered. The left neck and chest including indwelling catheter were prepped with chlorhexidine in a sterile fashion, and a sterile drape was applied covering the operative field. Maximum barrier sterile technique with sterile gowns and gloves were used for the procedure. A timeout was performed prior to the initiation of the procedure. Initial scout images were acquired. Soft tissues were anesthetized with 1% lidocaine without epinephrine. Stiff Glidewire was then advanced through the left-sided catheter, navigated to the IVC under fluoroscopy. The cuff was bluntly freed from the soft tissue tract and the catheter was withdrawn. The cuff immediately was freed from the soft tissues and withdrawn. It was evident that there was a long sleeve fibrin sheath attached to the cuff. This was sharply dissected. Once the old catheter was removed from the Glidewire, 25 cm 8 French sheath was placed. Venogram was performed. Balloon angioplasty was then performed of the fibrin sheath, 10 mm x 40 mm. Balloon was withdrawn and the debris from the catheter was removed. Repeat venogram confirmed flow with resolution of the fibrin sheath. A new 23 cm tip to cuff tunneled hemodialysis catheter was placed on the Glidewire. Glidewire was removed. Vigorous aspiration of both hubs confirmed adequate flow. Hep-Lock was placed through both hubs. Catheter was sutured in position. Final image was stored. Patient  tolerated the procedure well and remained hemodynamically stable throughout. No complication and no significant blood loss. FINDINGS: Initial venogram demonstrates fibrin sheath within the brachiocephalic vein extending into the SVC. After balloon angioplasty there is resolution of the fibrin  sheath. Functional hemodialysis catheter after placement. IMPRESSION: Status post exchange of left IJ tunneled hemodialysis catheter for a new 23 cm tip to cuff catheter, with balloon angioplasty of fibrin sheath. Signed, Dulcy Fanny. Dellia Nims, RPVI [facility] Inter[facility] Radiology [facility] Radiology Electronically Signed   By: Corrie Mckusick D.O.   On: 10/13/2020 16:30   VAS [place] LOWER EXTREMITY VENOUS (DVT)  Result Date: 10/12/2020  Lower Venous DVT Study Indications: Fever.  Risk Factors: Trauma Recent fall. Comparison Study: Previous exam 10/06/20 - negative Performing Technologist: Rogelia Rohrer  Examination Guidelines: A complete evaluation includes B-mode imaging, spectral Doppler, color Doppler, and power Doppler as needed of all accessible portions of each vessel. Bilateral testing is considered an integral part of a complete examination. Limited examinations for reoccurring indications may be performed as noted. The reflux portion of the exam is performed with the patient in reverse Trendelenburg.  +---------+---------------+---------+-----------+----------+--------------+ RIGHT    CompressibilityPhasicitySpontaneityPropertiesThrombus Aging +---------+---------------+---------+-----------+----------+--------------+ CFV      Full           Yes      Yes                                 +---------+---------------+---------+-----------+----------+--------------+ SFJ      Full                                                        +---------+---------------+---------+-----------+----------+--------------+ FV Prox  Full           Yes      Yes                                 +---------+---------------+---------+-----------+----------+--------------+ FV Mid   Full           Yes      Yes                                 +---------+---------------+---------+-----------+----------+--------------+ FV DistalFull           Yes      Yes                                  +---------+---------------+---------+-----------+----------+--------------+ PFV      Full                                                        +---------+---------------+---------+-----------+----------+--------------+ POP      Full           Yes      Yes                                 +---------+---------------+---------+-----------+----------+--------------+  PTV      Full                                                        +---------+---------------+---------+-----------+----------+--------------+ PERO     Full                                                        +---------+---------------+---------+-----------+----------+--------------+   +---------+---------------+---------+-----------+----------+-------------------+ LEFT     CompressibilityPhasicitySpontaneityPropertiesThrombus Aging      +---------+---------------+---------+-----------+----------+-------------------+ CFV      Full           Yes      Yes                                      +---------+---------------+---------+-----------+----------+-------------------+ SFJ      Full                                                             +---------+---------------+---------+-----------+----------+-------------------+ FV Prox  Full           Yes      Yes                                      +---------+---------------+---------+-----------+----------+-------------------+ FV Mid   Full           Yes      Yes                  Still compression -                                                       due to pain                                                               intolerance         +---------+---------------+---------+-----------+----------+-------------------+ FV Distal               Yes      Yes                  Patent by color and                                                       doppler              +---------+---------------+---------+-----------+----------+-------------------+  PFV      Full                                                             +---------+---------------+---------+-----------+----------+-------------------+ POP                                                   Not seen - bandage  +---------+---------------+---------+-----------+----------+-------------------+ PTV                                                   Not seen - bandage  +---------+---------------+---------+-----------+----------+-------------------+ PERO                                                  Not seen - bandage  +---------+---------------+---------+-----------+----------+-------------------+   Left Technical Findings: Not visualized segments include Pop V, PTV, and Pero V. Unable to image the above due to bandage to LLE from knee to ankle. Compressions limited to pain intolerance.   Summary: BILATERAL: - No evidence of deep vein thrombosis seen in the lower extremities, bilaterally. - No evidence of superficial venous thrombosis in the lower extremities, bilaterally. -No evidence of popliteal cyst, bilaterally.  LEFT: - Portions of this examination were limited- see technologist comments above.  *See table(s) above for measurements and observations. Electronically signed by Deitra Mayo MD on 10/12/2020 at 4:05:53 PM.    Final     Labs: BMET Recent Labs  Lab 10/08/20 0542 10/09/20 [id number] 10/10/20 0342 10/11/20 0201 10/12/20 0244 10/13/20 0246 10/14/20 0255  NA 135 132* 133* 133* 131* 129* 134*  K 4.8 4.2 4.5 4.0 5.2* 4.5 3.4*  CL 96* 98 97* 100 99 97* 98  CO2 '28 27 27 27 '$ 21* 26 28  GLUCOSE 114* 93 167* 120* 90 149* 120*  BUN 38* 23* 35* 27* 38* 45* 14  CREATININE 5.87* 3.98* 5.22* 3.89* 5.20* 6.47* 2.77*  CALCIUM 8.9 8.6* 8.8* 8.4* 8.4* 8.4* 7.9*  PHOS 3.9 3.7 5.0* 3.0 3.4 4.0 2.2*   CBC Recent Labs  Lab 10/08/20 0542 10/09/20 0619 10/10/20 0651  10/11/20 0201 10/12/20 1246 10/13/20 0246 10/14/20 0255  WBC 6.2 6.4   < > 7.3 9.0 9.3 10.5  NEUTROABS 2.2 2.5  --  2.8  --   --   --   HGB 9.6* 9.1*   < > 8.2* 7.7* 7.8* 7.8*  HCT 30.2* 29.0*   < > 25.7* 24.6* 24.9* 25.2*  MCV 88.3 89.0   < > 87.7 87.5 87.7 90.3  PLT 195 152   < > 177 199 227 192   < > = values in this interval not displayed.    Medications:    . calcium carbonate  2 tablet Oral TID AC  . Chlorhexidine Gluconate Cloth  6 each Topical Q0600  . Chlorhexidine Gluconate Cloth  6 each Topical Q0600  . darbepoetin (ARANESP) injection -  DIALYSIS  150 mcg Intravenous Q Fri-HD  . heparin injection (subcutaneous)  5,000 Units Subcutaneous Q8H  . heparin sodium (porcine)      . lidocaine  1 patch Transdermal Q24H  . polyethylene glycol  17 g Oral BID  . senna-docusate  2 tablet Oral BID      Otelia Santee, MD 10/14/2020, 9:26 AM

## 2020-10-14 NOTE — Progress Notes (Signed)
HD#12 Subjective:   No acute events overnight. Patient had HD late yesterday due to catheter exchange earlier in the day and per nephrology and RN report was more alert after the session.  During evaluation at bedside this morning, patient is sleeping initially. She arouses to loud voice but is drowsy. She states she doesn't feel good however does not elaborate. Emesis bag noted at her side, and when asked if she is nauseated, she says yes. She denies pain or difficulty breathing. She is oriented to self, states we are in [facility] and says the month/year is March 2021. She is unable to explain why she is in the hospital but after queuing states, "oh yes, I remember now."   Spoke with patient's nurse who reports similar drowsiness since the start of day shift at 7pm. She reports she has had to remind the patient to drink and eat.   Objective:   Vital signs in last 24 hours: Vitals:   10/14/20 0153 10/14/20 0208 10/14/20 0223 10/14/20 0238  BP: (!) 104/56 (!) 110/55 121/60 102/70  Pulse:   61   Resp:   18   Temp:   98.5 F (36.9 C)   TempSrc:   Oral   SpO2:      Weight:      Height:       Physical Exam General: chronically ill appearing, in NAD, drowsy Skin: no erythema or edema overlying the left subclavian line or right hip's surgical incision. Bilateral lower extremities in stabilizing/pressure-offloading boots Neuro: oriented to self; states we are in [facility] and that the month/year is March 2021. Unable to explain reason for admission.  Assessment/Plan:   Principal Problem:   Fracture of femoral neck, right (HCC) Active Problems:   ESRD (end stage renal disease) ([place])   Closed fracture of left proximal tibia   Fever   Osteoporosis   Patient Summary:  LYNCOLN HOUSLER is a 55 y.o. woman with history of ESRD on HD MWF, diabetes type 2, hypertension, NAFLD, GERD, history of recurrent UTIs, and chronic bilateral lower extremity weakness who presented after a  fall and found to have a R femoral neck fracture. She underwent percutaneous pinning on 10/02/20. On 10/07/20 she was found to have a right tibia and fibular fracture and subsequently underwent an ORIF on 4/7. She is now awaiting placement at a skilled facility for ongoing rehabilitation.  This is hospital day 12.  Right femoral neck fracture s/p percutaneous pinning 3/31  Mobility restrictions: WBAT for transfers only; unrestricted ROM R hip and knee R proximal tibia and fibular fractures s/p ORIF 4/7. Per ortho, suspect patient had stress fracture that completed upon weight-bearing activity.  Mobility restrictions: NWB on L leg, unrestricted ROM L knee, no bracing Therapy: PT/OT Dressing changes prn Pain management: oxycodone 2.5 mg BID prn, lidocaine patch Follow-up needs: suture removal for R hip ~4/14  Hypoactive delirium Patient noted to have waxing and waning alertness over the last few days but noted to be especially drowsy this morning. She is at risk given her chronic illness and immobility. Potential causes include infection (notably blood cultures negative to date, patient afebrile since 4/9, no leukocytosis), uremia (though patient noted to have improvement in mental status following dialysis), medications (have deescalated pain regimen, will deescalate further), constipation (patient is on bowel regimen, though last BM 4/9), dehydration (she has to be reminded to drink), prolonged hospitalization. Patient has so far not given consent for me to talk to family, will  ask again. - Delirium precautions - Deescalate pain regimen as above  - Continue bowel regimen  ESRD on HD MWF Osteoporosis Management per nephrology. S/p exchange of left IJ tunneled HD catheter by IR yesterday (4/11).  Post operative anemia on anemia of CKD. Continue to monitor.  Constipation resolved. BM 4/9. Patient reports she has not had a BM since but declines repeat dulcolax enema at this time. Continue bowel  regimen.  Fever (4/9 AM).  Afebrile since. Blood cultures obtained on 4/9 in anticipation of cath exchange NGTD.   Type 2 diabetes mellitus. Glucoses have been at goal even since discontinuing insulin (Lantus 5 u). Will continue to hold at this time and consider resumption as needed.   Essential hypertension. BP soft overnight and this morning.  - Hold antihypertensives (Isordil, amlodipine, and hydralazine) in setting of soft BP. - Holding Coreg in setting of mild bradycardia and normotension.   VTE: Heparin Code: Full Dispo: SNF for rehab  Alexandria Lodge, MD Internal Medicine Resident, PGY-1 [facility] Pager: [phone] 6:45 AM, 10/14/2020   Please contact the on call pager after 5 pm and on weekends at [phone].

## 2020-10-14 NOTE — TOC Progression Note (Addendum)
Transition of Care [facility]) - Progression Note    Patient Details  Name: [name] [name] MRN: [id number] Date of Birth: 31-Mar-1966  Transition [facility]) CM/SW Contact  Milinda Antis, [place] Phone Number: 10/14/2020, 3:03 PM  Clinical Narrative:    CSW faxed clinicals to receive prior insurance auth.  The reference number is [id number].  The facility is still pending and patient is not currently medically clear for d/c.  CSW was informed to call insurance authorization [id number] and choose option 8 to be connected with Amy when a facility is chosen.  CSW attempted to speak with the patient to follow up on whether her son would be able to transport to and from dialysis if patient is placed at [facility], her facility of choice.  The patient was extremely drowsy and could not stay awake to speak with CSW.  CSW called the number for patient's son, Omar Person and the number was disconnected.  CSW called the patient's spouse, Chancey Folker, to ask about patient's son transporting.  Mr. Southward stated that he was unaware and gave CSW the correct number for the son.  CSW called patient's son Omar Person, [phone].  Mr. Gilford Rile informed CSW that he would be unable to transport the patient due to him leaving the company that he is currently working for.  CSW will follow up with patient on 4/13, if she is more alert, to give bed offers from agencies who can transport patient to dialysis.   Expected Discharge Plan: Skilled Nursing Facility Barriers to Discharge: SNF Pending bed offer,Insurance Authorization,Continued Medical Work up  Expected Discharge Plan and Services Expected Discharge Plan: [place] Choice: [place] arrangements for the past 2 months: Single Family Home                                       Social Determinants of Health (SDOH) Interventions    Readmission Risk Interventions Readmission Risk  Prevention Plan 06/03/2020  Transportation Screening Complete  PCP or Specialist Appt within 3-5 Days Complete  HRI or [place] Complete  Social Work Consult for [place] Planning/Counseling Complete  Palliative Care Screening Not Applicable  Medication Review Press photographer) Complete  Some recent data might be hidden

## 2020-10-15 LAB — CBC
HCT: 24.3 % — ABNORMAL LOW (ref 36.0–46.0)
Hemoglobin: 7.4 g/dL — ABNORMAL LOW (ref 12.0–15.0)
MCH: 27.7 pg (ref 26.0–34.0)
MCHC: 30.5 g/dL (ref 30.0–36.0)
MCV: 91 fL (ref 80.0–100.0)
Platelets: 233 10*3/uL (ref 150–400)
RBC: 2.67 MIL/uL — ABNORMAL LOW (ref 3.87–5.11)
RDW: 17.5 % — ABNORMAL HIGH (ref 11.5–15.5)
WBC: 9.6 10*3/uL (ref 4.0–10.5)
nRBC: 0 % (ref 0.0–0.2)

## 2020-10-15 LAB — RENAL FUNCTION PANEL
Albumin: 2.3 g/dL — ABNORMAL LOW (ref 3.5–5.0)
Anion gap: 9 (ref 5–15)
BUN: 22 mg/dL — ABNORMAL HIGH (ref 6–20)
CO2: 27 mmol/L (ref 22–32)
Calcium: 8.2 mg/dL — ABNORMAL LOW (ref 8.9–10.3)
Chloride: 97 mmol/L — ABNORMAL LOW (ref 98–111)
Creatinine, Ser: 4.63 mg/dL — ABNORMAL HIGH (ref 0.44–1.00)
GFR, Estimated: 11 mL/min — ABNORMAL LOW (ref >60)
Glucose, Bld: 120 mg/dL — ABNORMAL HIGH (ref 70–99)
Phosphorus: 3.7 mg/dL (ref 2.5–4.6)
Potassium: 4.3 mmol/L (ref 3.5–5.1)
Sodium: 133 mmol/L — ABNORMAL LOW (ref 135–145)

## 2020-10-15 LAB — GLUCOSE, CAPILLARY
Glucose-Capillary: 108 mg/dL — ABNORMAL HIGH (ref 70–99)
Glucose-Capillary: 162 mg/dL — ABNORMAL HIGH (ref 70–99)
Glucose-Capillary: 134 mg/dL — ABNORMAL HIGH (ref 70–99)
Glucose-Capillary: 170 mg/dL — ABNORMAL HIGH (ref 70–99)

## 2020-10-15 MED ORDER — BISACODYL 10 MG RE SUPP
10.0000 mg | Freq: Once | RECTAL | Status: AC
  Administered 2020-10-15: 10 mg via RECTAL
  Filled 2020-10-15: qty 1

## 2020-10-15 MED ORDER — HEPARIN SODIUM (PORCINE) 1000 UNIT/ML IJ SOLN
INTRAMUSCULAR | Status: AC
  Administered 2020-10-15: 3800 [IU] via INTRAVENOUS_CENTRAL
  Filled 2020-10-15: qty 4

## 2020-10-15 NOTE — Progress Notes (Signed)
HD#13 Subjective:   No acute events overnight.   Patient evaluated during HD session this morning. She is sleeping initially however arouses easily to voice. She states she is feeling "alright" this morning. She is oriented to person, place, and time. She acts surprised when told that yesterday she could not remember why she was in the hospital or the year. She states she does not feel confused and that she knows where she is and why. States she has not had a bowel movement since her last on 4/9. Denies abdominal pain. States her leg pain is controlled. Reports she has not eaten breakfast because she did not have a chance to eat prior to HD but that she is hungry for a meal.  Called patient's husband to provide update, and he reports he spoke with the patient earlier this morning and that she was at her baseline mental status. He reports he noticed over the last couple of days prior that she was thinking more slowly and having memory difficulties.   Objective:   Vital signs in last 24 hours: Vitals:   10/14/20 0238 10/14/20 0852 10/14/20 1456 10/14/20 2045  BP: 102/70 (!) 116/58 (!) 123/56 (!) 126/54  Pulse:  61 62 67  Resp:  '17 18 18  '$ Temp:  98.6 F (37 C) 98.2 F (36.8 C) 99.8 F (37.7 C)  TempSrc:  Oral Oral Oral  SpO2:  96% 93% 95%  Weight:      Height:       Physical Exam General: chronically ill appearing, in NAD, sleeping initially however arouses to voice, more alert this morning Skin: no erythema or edema overlying the left subclavian line or right hip's surgical incision. Bilateral lower extremities in stabilizing/pressure-offloading boots Neuro: oriented to person, place, time, and situation  Assessment/Plan:   Principal Problem:   Fracture of femoral neck, right (HCC) Active Problems:   ESRD (end stage renal disease) ([place])   Closed fracture of left proximal tibia   Fever   Osteoporosis   Patient Summary:  [name] [name] is a 55 y.o. woman with history of  ESRD on HD MWF, diabetes type 2, hypertension, NAFLD, GERD, history of recurrent UTIs, and chronic bilateral lower extremity weakness who presented after a fall and found to have a R femoral neck fracture. She underwent percutaneous pinning on 10/02/20. On 10/07/20 she was found to have a right tibia and fibular fracture and subsequently underwent an ORIF on 4/7. She is now awaiting placement at a skilled facility for ongoing rehabilitation. Placement is complicated by logistics of securing transportation to and from dialysis.  This is hospital day 13.  Right femoral neck fracture s/p percutaneous pinning 3/31  Mobility restrictions: WBAT for transfers only; unrestricted ROM R hip and knee R proximal tibia and fibular fractures s/p ORIF 4/7. Per ortho, suspect patient had stress fracture that completed upon weight-bearing activity.  Mobility restrictions: NWB on L leg, unrestricted ROM L knee, no bracing Therapy: PT/OT Dressing changes prn Pain management: oxycodone 2.5 mg BID prn, lidocaine patch Follow-up needs: suture removal for R hip ~4/14  Delirium, improving Patient's mental status significantly improved this morning. Per family, she is at her baseline mental status this morning. Since she has had waxing and waning alertness over the last few days, will continue to monitor closely for mental status changes. Pain regimen deescalated further yesterday. Will give dulcolax suppository today as patient has not had BM since 4/9. Low suspicion for infection at this time. Suspect long  hospital stay is contributing. - Delirium precautions - Deescalate pain regimen as above  - Continue bowel regimen, dulcolax suppository today  ESRD on HD MWF Osteoporosis Management per nephrology. S/p exchange of left IJ tunneled HD catheter by IR (4/11). HD today.   Post operative anemia on anemia of CKD. Hemoglobin between 7.4-9.7 over the last 5 days. Continue to monitor.  Constipation  Intermittent this  admission. Last BM 4/9. Will continue daily bowel regimen (Miralax and Senokot-S). Dulcolax suppository today.   Fever (4/9 AM).  Afebrile since. Blood cultures obtained on 4/9 in anticipation of cath exchange NGTD.   Type 2 diabetes mellitus. Glucoses have been at goal even since discontinuing insulin (Lantus 5 u). Will continue to hold at this time and consider resumption as needed.   Essential hypertension. BP improved since stopping antihypertensives. Will monitor closely and add back as needed.  - Continue to hold antihypertensives (Isordil, amlodipine, and hydralazine)  - Holding Coreg in setting of mild bradycardia and normotension.   VTE: Heparin Code: Full Dispo: Patient is medically stable for discharge to SNF. Our social work Medical laboratory scientific officer and renal navigator are working to arrange for placement to SNF and securing transportation.   Alexandria Lodge, MD Internal Medicine Resident, PGY-1 [facility] Pager: [phone] 6:14 AM, 10/15/2020   Please contact the on call pager after 5 pm and on weekends at [phone].

## 2020-10-15 NOTE — Progress Notes (Addendum)
Navigator continuing to follow along for disposition planning-spoke with TOC CSW/R. Blade yesterday who states possibility of patient's son to transport to [facility] with his company. We have confirmed that [facility] is not an option. CSW states patient is not agreeable to [facility] or [facility], and [facility] is other offer. Navigator informed CSW that patient's HD clinic will need to be switched and [facility] only accepts HD patients on MWF schedules. CSW informed Navigator yesterday that patient is not ready for discharge and was not alert enough for her to speak with.  Navigator met with patient at HD bedside to inform her about what her SNF options mean for her outpatient dialysis. Patient confirmed that her son cannot transport her to [place] to [facility]. She understands that she cannot stay in the hospital as long as it will take for her to be re-certified with [facility] (since she has been a rider in the past, it could take up to 21 days to be re-certified). She states she does not have family or friends to privately transport her or enough funds to hire a private transport company. Navigator informed patient that the only SNF offer at this point which will allow her to stay at her current HD clinic is [facility]. She is disappointed, as she has been there before and does not want to return. Navigator told patient that is she chooses a SNF, such as [facility] in [place] is happy to search for a clinic in [place], but that she is in jeopardy of losing her seat schedule at [facility] if she ends up being out of that clinic for greater than 30 days, which I believe will be the case. Navigator explained that the clinic in [place] can refer her back to [facility] when she is discharged from SNF, but there is no guarantee that she will get a MWF seat, and Navigator is actually doubtful that she will. She wants a MWF. She asked if there are any other SNFs that will take her to her current  clinic/[facility]. Navigator is aware that [facility] SNF in [place] will and that there might be one other ([facility] in [place]). She has been to [facility] before and is willing to return if she were to get an offer. She is not familiar with [facility] (Navigator not positive if this SNF transports to [facility], but does to [facility]). Navigator spoke with [facility] CSW regarding conversation with patient and greatly appreciates her continued bed search in order to keep patient at her home HD clinic if possible. Navigator will continue to follow.  Update: patient on phone talking with someone and tells Navigator that whoever she is talking with says "[facility] isn't good." Navigator passed this along to CSW.  Alphonzo Cruise, [place] Renal Navigator [phone]

## 2020-10-15 NOTE — Progress Notes (Signed)
OT Cancellation Note  Patient Details Name: [name] [name] MRN: [id number] DOB: Aug 26, 1965   Cancelled Treatment:    Reason Eval/Treat Not Completed: Patient at procedure or test/ unavailable. Pt was off unit at HD this morning. Will follow up next available time  Britt Bottom 10/15/2020, 3:16 PM

## 2020-10-15 NOTE — Progress Notes (Signed)
[facility] Progress Note   HD orders: [facility] 22mn BFR 350 DFR 600 EDW 62kg 2K/2.5Ca F180, heparin 5000 bolus and 500/hr maint  Assessment/ Plan:   **Hip fracture: s/p operative management per ortho. PT eval to determine dispo. Please avoid morphine in this ESRD patient. No evidence of DVT although limited exam on 10/06/20  ** L prox tibia fracture- ORIF bicondylar tibial plateau 10/09/20.   **ESRD on HD MWF: s/p HD Friday per schedule. Continue MWF schedule. L IJ TDC hasn't been able to run properly ->  appreciate  VIR exchanging it on 4/11 noted to have a fibrin sheath which was disrupted. Now catheter working great.  Seen on  HD today 110/44 goal uf 1L net Denies dyspnea, cramping or dizziness. 2K bath through [facility]  - She continues to be much more alert;  uremia certainly was  contributing to the AMS.   **BMM: clarified - she takes ca carbonate 2 TIDAC as binder, Phos - 6.5, resumed home binder; corr ca ok.   **DM: insulin per primary  **HTN: continue home meds; per outpt HD records frequent HTN.   **Anemia: Hb 10.6 >9.8 >9.6. post op. Trend. Check iron indices.      Subjective:   No fevers overnight; blood cultures negative to date (from 4/9). She's more alert.   denies f/c/n/v/dyspnea   Objective:   BP (!) 110/41   Pulse 63   Temp 99.4 F (37.4 C) (Oral)   Resp 16   Ht 5' (1.524 m)   Wt 65.8 kg   SpO2 96%   BMI 28.33 kg/m  No intake or output data in the 24 hours ending 10/15/20 0917 Weight change:   Physical Exam: Gen: in bed and much more arousable today answering questions appropriately CV:RRR Lungs: clear ant Extr: No edema Neuro:nonfocal Skin:LIJTDCc/d/i  Imaging: IR Fluoro Guide CV Line Left  Result Date: 10/13/2020 INDICATION: 55year old female referred for exchange of nonfunctional left IJ tunneled hemodialysis catheter. Report is that the catheter only works when tPA is infused. EXAM: VENOGRAM LEFT  UPPER EXTREMITY AND CENTRAL VEINS BALLOON ANGIOPLASTY FIBRIN SHEATH EXCHANGE OF LEFT IJ TUNNELED HEMODIALYSIS CATHETER MEDICATIONS: 2 g Ancef. The antibiotic was given in an appropriate time interval prior to skin puncture. ANESTHESIA/SEDATION: Moderate Sedation Time: 0 minutes. The patient's level of consciousness and vital signs were monitored continuously by radiology nursing throughout the procedure under my direct supervision. FLUOROSCOPY TIME:  Fluoroscopy Time: 2 minutes 42 seconds (33 mGy). COMPLICATIONS: None PROCEDURE: Informed written consent was obtained from the patient after a discussion of the risks, benefits, and alternatives to treatment. Questions regarding the procedure were encouraged and answered. The left neck and chest including indwelling catheter were prepped with chlorhexidine in a sterile fashion, and a sterile drape was applied covering the operative field. Maximum barrier sterile technique with sterile gowns and gloves were used for the procedure. A timeout was performed prior to the initiation of the procedure. Initial scout images were acquired. Soft tissues were anesthetized with 1% lidocaine without epinephrine. Stiff Glidewire was then advanced through the left-sided catheter, navigated to the IVC under fluoroscopy. The cuff was bluntly freed from the soft tissue tract and the catheter was withdrawn. The cuff immediately was freed from the soft tissues and withdrawn. It was evident that there was a long sleeve fibrin sheath attached to the cuff. This was sharply dissected. Once the old catheter was removed from the Glidewire, 25 cm 8 French sheath was placed. Venogram was performed. Balloon angioplasty  was then performed of the fibrin sheath, 10 mm x 40 mm. Balloon was withdrawn and the debris from the catheter was removed. Repeat venogram confirmed flow with resolution of the fibrin sheath. A new 23 cm tip to cuff tunneled hemodialysis catheter was placed on the Glidewire.  Glidewire was removed. Vigorous aspiration of both hubs confirmed adequate flow. Hep-Lock was placed through both hubs. Catheter was sutured in position. Final image was stored. Patient tolerated the procedure well and remained hemodynamically stable throughout. No complication and no significant blood loss. FINDINGS: Initial venogram demonstrates fibrin sheath within the brachiocephalic vein extending into the SVC. After balloon angioplasty there is resolution of the fibrin sheath. Functional hemodialysis catheter after placement. IMPRESSION: Status post exchange of left IJ tunneled hemodialysis catheter for a new 23 cm tip to cuff catheter, with balloon angioplasty of fibrin sheath. Signed, Dulcy Fanny. Dellia Nims, RPVI Vascular and Interventional Radiology Specialists [facility] Radiology Electronically Signed   By: Corrie Mckusick D.O.   On: 10/13/2020 16:30   IR PTA VENOUS EXCEPT DIALYSIS CIRCUIT  Result Date: 10/13/2020 INDICATION: 55 year old female referred for exchange of nonfunctional left IJ tunneled hemodialysis catheter. Report is that the catheter only works when tPA is infused. EXAM: VENOGRAM LEFT UPPER EXTREMITY AND CENTRAL VEINS BALLOON ANGIOPLASTY FIBRIN SHEATH EXCHANGE OF LEFT IJ TUNNELED HEMODIALYSIS CATHETER MEDICATIONS: 2 g Ancef. The antibiotic was given in an appropriate time interval prior to skin puncture. ANESTHESIA/SEDATION: Moderate Sedation Time: 0 minutes. The patient's level of consciousness and vital signs were monitored continuously by radiology nursing throughout the procedure under my direct supervision. FLUOROSCOPY TIME:  Fluoroscopy Time: 2 minutes 42 seconds (33 mGy). COMPLICATIONS: None PROCEDURE: Informed written consent was obtained from the patient after a discussion of the risks, benefits, and alternatives to treatment. Questions regarding the procedure were encouraged and answered. The left neck and chest including indwelling catheter were prepped with chlorhexidine in a  sterile fashion, and a sterile drape was applied covering the operative field. Maximum barrier sterile technique with sterile gowns and gloves were used for the procedure. A timeout was performed prior to the initiation of the procedure. Initial scout images were acquired. Soft tissues were anesthetized with 1% lidocaine without epinephrine. Stiff Glidewire was then advanced through the left-sided catheter, navigated to the IVC under fluoroscopy. The cuff was bluntly freed from the soft tissue tract and the catheter was withdrawn. The cuff immediately was freed from the soft tissues and withdrawn. It was evident that there was a long sleeve fibrin sheath attached to the cuff. This was sharply dissected. Once the old catheter was removed from the Glidewire, 25 cm 8 French sheath was placed. Venogram was performed. Balloon angioplasty was then performed of the fibrin sheath, 10 mm x 40 mm. Balloon was withdrawn and the debris from the catheter was removed. Repeat venogram confirmed flow with resolution of the fibrin sheath. A new 23 cm tip to cuff tunneled hemodialysis catheter was placed on the Glidewire. Glidewire was removed. Vigorous aspiration of both hubs confirmed adequate flow. Hep-Lock was placed through both hubs. Catheter was sutured in position. Final image was stored. Patient tolerated the procedure well and remained hemodynamically stable throughout. No complication and no significant blood loss. FINDINGS: Initial venogram demonstrates fibrin sheath within the brachiocephalic vein extending into the SVC. After balloon angioplasty there is resolution of the fibrin sheath. Functional hemodialysis catheter after placement. IMPRESSION: Status post exchange of left IJ tunneled hemodialysis catheter for a new 23 cm tip to cuff catheter, with  balloon angioplasty of fibrin sheath. Signed, Dulcy Fanny. Dellia Nims, RPVI Vascular and Interventional Radiology Specialists [facility] Radiology Electronically Signed   By:  Corrie Mckusick D.O.   On: 10/13/2020 16:30    Labs: BMET Recent Labs  Lab 10/09/20 [id number] 10/10/20 0342 10/11/20 0201 10/12/20 0244 10/13/20 0246 10/14/20 0255 10/15/20 0317  NA 132* 133* 133* 131* 129* 134* 133*  K 4.2 4.5 4.0 5.2* 4.5 3.4* 4.3  CL 98 97* 100 99 97* 98 97*  CO2 '27 27 27 '$ 21* '26 28 27  '$ GLUCOSE 93 167* 120* 90 149* 120* 120*  BUN 23* 35* 27* 38* 45* 14 22*  CREATININE 3.98* 5.22* 3.89* 5.20* 6.47* 2.77* 4.63*  CALCIUM 8.6* 8.8* 8.4* 8.4* 8.4* 7.9* 8.2*  PHOS 3.7 5.0* 3.0 3.4 4.0 2.2* 3.7   CBC Recent Labs  Lab 10/09/20 0619 10/10/20 0651 10/11/20 0201 10/12/20 1246 10/13/20 0246 10/14/20 0255 10/15/20 0317  WBC 6.4   < > 7.3 9.0 9.3 10.5 9.6  NEUTROABS 2.5  --  2.8  --   --   --   --   HGB 9.1*   < > 8.2* 7.7* 7.8* 7.8* 7.4*  HCT 29.0*   < > 25.7* 24.6* 24.9* 25.2* 24.3*  MCV 89.0   < > 87.7 87.5 87.7 90.3 91.0  PLT 152   < > 177 199 227 192 233   < > = values in this interval not displayed.    Medications:    . bisacodyl  10 mg Rectal Once  . calcium carbonate  2 tablet Oral TID AC  . Chlorhexidine Gluconate Cloth  6 each Topical Q0600  . Chlorhexidine Gluconate Cloth  6 each Topical Q0600  . darbepoetin (ARANESP) injection - DIALYSIS  150 mcg Intravenous Q Fri-HD  . heparin injection (subcutaneous)  5,000 Units Subcutaneous Q8H  . lidocaine  1 patch Transdermal Q24H  . polyethylene glycol  17 g Oral BID  . senna-docusate  2 tablet Oral BID      Otelia Santee, MD 10/15/2020, 9:17 AM

## 2020-10-15 NOTE — Progress Notes (Signed)
Physical Therapy Treatment Patient Details Name: [name] [name] MRN: [id number] DOB: December 21, 1965 Today's Date: 10/15/2020    History of Present Illness 55 year old female adm with  R femoral neck fx s/p precutaneoius pinnning on 3/31. Underwent ORIF of L tibial plateau on 10/09/2020. PMH: ESRD on HD, HTN, syncope and collapse, diabetic neuropathy, mitral regurgitation, impaired vision, HTN, heart murmur, colitis, and DM.    PT Comments    Pt supine in bed this session.  Able to move to edge of bed this session but limited to sitting due to bout of dizziness.  Poor tolerance noted sitting edge of bed.  Plan for orthostatic vitals next session.  Plan for snf remains appropriate.      Follow Up Recommendations  SNF     Equipment Recommendations  None recommended by PT    Recommendations for Other Services       Precautions / Restrictions Precautions Precautions: Fall Required Braces or Orthoses: Other Brace Other Brace: B PRAFO when in resting position. Restrictions Weight Bearing Restrictions: Yes RLE Weight Bearing: Weight bearing as tolerated RLE Partial Weight Bearing Percentage or Pounds: for transfers only LLE Weight Bearing: Non weight bearing    Mobility  Bed Mobility Overal bed mobility: Needs Assistance Bed Mobility: Supine to Sit;Sit to Supine     Supine to sit: Mod assist;Max assist;+2 for physical assistance Sit to supine: Total assist;+2 for physical assistance   General bed mobility comments: MOd +2 to advance LE to edge of bed and elevate trunk into sitting. Max assistance to scoot hips to edge of bed.  For back to bed required assistance to lower trunk and lift B LEs back into bed.  +2 total to scoot to edge of bed.    Transfers Overall transfer level:  (unable c/o dizziness)               General transfer comment: Unable due to dizziness BP 122/62 sitting edge of bed.  Ambulation/Gait                 Stairs             Wheelchair  Mobility    Modified Rankin (Stroke Patients Only)       Balance                                            Cognition Arousal/Alertness: Awake/alert Behavior During Therapy: Flat affect Overall Cognitive Status: Difficult to assess Area of Impairment: Problem solving                         Safety/Judgement: Decreased awareness of safety   Problem Solving: Requires verbal cues;Slow processing        Exercises General Exercises - Lower Extremity Ankle Circles/Pumps: AAROM;Both;10 reps;Supine Short Arc Quad: AAROM;Both;10 reps;Supine Heel Slides: AAROM;Both;10 reps;Supine Hip ABduction/ADduction: AAROM;Both;10 reps;Supine    General Comments        Pertinent Vitals/Pain Pain Assessment: Faces Pain Score: 9  Pain Location: BLE, L knee Pain Descriptors / Indicators: Discomfort;Grimacing;Guarding Pain Intervention(s): Monitored during session;Repositioned    Home Living                      Prior Function            PT Goals (current goals can now be found in the care  plan section) Acute Rehab PT Goals Patient Stated Goal: to eventually return home Potential to Achieve Goals: Fair Progress towards PT goals: Progressing toward goals    Frequency    Min 3X/week      PT Plan Current plan remains appropriate    Co-evaluation              AM-PAC PT "6 Clicks" Mobility   Outcome Measure  Help needed turning from your back to your side while in a flat bed without using bedrails?: A Lot Help needed moving from lying on your back to sitting on the side of a flat bed without using bedrails?: A Lot Help needed moving to and from a bed to a chair (including a wheelchair)?: Total Help needed standing up from a chair using your arms (e.g., wheelchair or bedside chair)?: Total Help needed to walk in hospital room?: Total Help needed climbing 3-5 steps with a railing? : Total 6 Click Score: 8    End of Session    Activity Tolerance: Patient limited by lethargy;Patient limited by pain Patient left: in bed;with call bell/phone within reach;with bed alarm set Nurse Communication: Mobility status PT Visit Diagnosis: Other abnormalities of gait and mobility (R26.89);History of falling (Z91.81);Pain Pain - Right/Left: Right Pain - part of body: Hip     Time: [id number] PT Time Calculation (min) (ACUTE ONLY): 24 min  Charges:  $Therapeutic Exercise: 8-22 mins $Therapeutic Activity: 8-22 mins                     Erasmo Leventhal , PTA Acute Rehabilitation Services Pager [phone] Office [phone]     Kitana Gage Eli Hose 10/15/2020, 4:33 PM

## 2020-10-15 NOTE — Plan of Care (Signed)

## 2020-10-15 NOTE — TOC Progression Note (Addendum)
Transition of Care [facility]) - Progression Note    Patient Details  Name: EMONIE POLIVKA MRN: [id number] Date of Birth: 1966-05-13  Transition [facility]) CM/SW Contact  Milinda Antis, [place] Phone Number: 10/15/2020, 10:38 AM  Clinical Narrative:    CSW spoke with Terri Piedra, renal navigator, and was informed of two facilities that will transport the patient to her dialysis clinic.  [place] [phone]  CSW faxed clinicals to agency and is awaiting approval.  CSW left a message with Steffanie Dunn, Engineer, site.  [place]  [phone]- CSW faxed clinicals and spoke with Admissions Director Daymon Larsen who confirmed that the agency transported to [facility].  16:12-  [place] extended a bed offer.  CSW spoke with admissions director, Steffanie Dunn, and was informed that they can receive the patient tomorrow 4/13. D/C summary would need to be received prior to 3pm.  Patient is agreeable.    CSW completed the insurance authorization and was informed that insurance has approved and that the patient is "good to go".  The authorization number has not populated, but if needed by the facility, it will populate within 24 hours.   Expected Discharge Plan: Skilled Nursing Facility Barriers to Discharge: SNF Pending bed offer,Insurance Authorization,Continued Medical Work up  Expected Discharge Plan and Services Expected Discharge Plan: [place] Choice: [place] arrangements for the past 2 months: Single Family Home                                       Social Determinants of Health (SDOH) Interventions    Readmission Risk Interventions Readmission Risk Prevention Plan 06/03/2020  Transportation Screening Complete  PCP or Specialist Appt within 3-5 Days Complete  HRI or [place] Complete  Social Work Consult for [place] Planning/Counseling  Complete  Palliative Care Screening Not Applicable  Medication Review Press photographer) Complete  Some recent data might be hidden

## 2020-10-15 NOTE — Progress Notes (Signed)
PT Cancellation Note  Patient Details Name: [name] [name] MRN: [id number] DOB: 1965/12/10   Cancelled Treatment:    Reason Eval/Treat Not Completed: Medical issues which prohibited therapy (Pt off unit for HD)   Cristela Blue 10/15/2020, 10:53 AM  Erasmo Leventhal , PTA Acute Rehabilitation Services Pager [phone] Office [phone]

## 2020-10-15 NOTE — NC FL2 (Addendum)
[place] LEVEL OF CARE SCREENING TOOL     IDENTIFICATION  Patient Name: [name] [name] Birthdate: [birth year] Sex: female Admission Date (Current Location): 10/02/2020  [facility] and [place] Number:  Herbalist and Address:  [facility], [place] [address] [place], [place] [address]      Provider Number: [id number]  Attending Physician Name and Address:  Lucious Groves, DO  Relative Name and Phone Number:  Benjaman Lobe)   [phone] [facility] Phone)    Current Level of Care: Hospital Recommended Level of Care: [place] Prior Approval Number:    Date Approved/Denied:   PASRR Number: [id number]  Discharge Plan: SNF    Current Diagnoses: Patient Active Problem List   Diagnosis Date Noted  . Osteoporosis 10/13/2020  . Fever   . Closed fracture of left proximal tibia 10/10/2020  . Fracture of femoral neck, right ([place]) 10/02/2020  . Pressure injury of skin 03/12/2020  . Sepsis ([place]) 03/07/2020  . ESRD (end stage renal disease) ([place]) 03/07/2020  . Hyperkalemia 03/07/2020  . Acute cystitis 03/07/2020  . Acute metabolic encephalopathy [id number]  . 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 Diabetes mellitus with complication ([place])   . Intractable headache   . Syncope 07/28/2015  . Chronic headaches 07/28/2015  . Chest pain 05/22/2015  . Urinary frequency 03/13/2015  . Easy bruising 10/09/2014  . Encounter for preventative adult health care exam with abnormal findings 01/11/2014  . Chronic constipation 01/11/2014  . GERD (gastroesophageal reflux disease) 01/11/2014  . Helicobacter positive gastritis 12/06/2013  . Dizziness and giddiness 10/12/2013  . Essential hypertension 09/19/2013  . Back pain 06/11/2013  . Lumbosacral spondylosis without myelopathy 02/20/2013  . Diabetic neuropathy ([place]) 01/09/2013  . DM (diabetes mellitus), type 2, uncontrolled ([place]) 10/02/2012  . MITRAL REGURGITATION, mild  10/31/2009  . PLEURAL EFFUSION, RIGHT after pyelonephritis in April 2011 (no intervention) 10/20/2009    Orientation RESPIRATION BLADDER Height & Weight     Self,Time,Situation,Place  Normal Continent Weight: 145 lb 1 oz (65.8 kg) Height:  5' (152.4 cm)  BEHAVIORAL SYMPTOMS/MOOD NEUROLOGICAL BOWEL NUTRITION STATUS      Continent Diet (See d/c summary)  AMBULATORY STATUS COMMUNICATION OF NEEDS Skin   Extensive Assist Verbally Surgical wounds (Incision right hip)                       Personal Care Assistance Level of Assistance  Bathing,Dressing,Feeding Bathing Assistance: Maximum assistance Feeding assistance: Independent Dressing Assistance: Limited assistance     Functional Limitations Info  Sight,Hearing,Speech Sight Info: Adequate Hearing Info: Adequate Speech Info: Adequate    SPECIAL CARE FACTORS FREQUENCY  PT (By licensed PT),OT (By licensed OT)     PT Frequency: 5x/week OT Frequency: 5x/week            Contractures Contractures Info: Not present    Additional Factors Info  Code Status,Allergies Code Status Info: Full Allergies Info: Amitriptyline, Ciprofloxacin, Semaglutide,Tramadol, tylenol           Current Medications (10/15/2020):  This is the current hospital active medication list Current Facility-Administered Medications  Medication Dose Route Frequency Provider Last Rate Last Admin  . 0.9 %  sodium chloride infusion   Intravenous Continuous Ainsley Spinner, PA-C 10 mL/hr at 10/09/20 1018 Continued from Pre-op at 10/09/20 1018  . bisacodyl (DULCOLAX) suppository 10 mg  10 mg Rectal Once Alexandria Lodge, MD      . calcium carbonate (TUMS - dosed  in mg elemental calcium) chewable tablet 400 mg of elemental calcium  2 tablet Oral TID Eldridge Dace, PA-C   400 mg of elemental calcium at 10/14/20 1631  . Chlorhexidine Gluconate Cloth 2 % PADS 6 each  6 each Topical Q0600 Ainsley Spinner, PA-C   6 each at 10/11/20 0515  . Chlorhexidine Gluconate Cloth 2 %  PADS 6 each  6 each Topical Q0600 Corliss Parish, MD   6 each at 10/15/20 0654  . Darbepoetin Alfa (ARANESP) injection 150 mcg  150 mcg Intravenous Q Fri-HD Corliss Parish, MD      . gabapentin (NEURONTIN) capsule 100 mg  100 mg Oral Daily PRN Ainsley Spinner, PA-C   100 mg at 10/14/20 1720  . gabapentin (NEURONTIN) capsule 200 mg  200 mg Oral QHS PRN Ainsley Spinner, PA-C   200 mg at 10/13/20 2153  . heparin injection 5,000 Units  5,000 Units Subcutaneous Q8H Ainsley Spinner, PA-C   5,000 Units at 10/15/20 [id number]  . heparin sodium (porcine) 1000 UNIT/ML injection           . lidocaine (LIDODERM) 5 % 1 patch  1 patch Transdermal Q24H Ainsley Spinner, PA-C   1 patch at 10/14/20 1319  . lidocaine (PF) (XYLOCAINE) 1 % injection 5 mL  5 mL Intradermal PRN Ainsley Spinner, PA-C      . menthol-cetylpyridinium (CEPACOL) lozenge 3 mg  1 lozenge Oral PRN Ainsley Spinner, PA-C       Or  . phenol (CHLORASEPTIC) mouth spray 1 spray  1 spray Mouth/Throat PRN Ainsley Spinner, PA-C      . metoCLOPramide (REGLAN) tablet 5-10 mg  5-10 mg Oral Q8H PRN Ainsley Spinner, PA-C   10 mg at 10/09/20 [id number]   Or  . metoCLOPramide (REGLAN) injection 5-10 mg  5-10 mg Intravenous Q8H PRN Ainsley Spinner, PA-C   10 mg at 10/08/20 1406  . ondansetron (ZOFRAN) tablet 4 mg  4 mg Oral Q6H PRN Ainsley Spinner, PA-C   4 mg at 10/14/20 [id number]   Or  . ondansetron (ZOFRAN) injection 4 mg  4 mg Intravenous Q6H PRN Ainsley Spinner, PA-C   4 mg at 10/12/20 1659  . oxyCODONE (Oxy IR/ROXICODONE) immediate release tablet 2.5 mg  2.5 mg Oral BID PRN Alexandria Lodge, MD   2.5 mg at 10/14/20 2122  . polyethylene glycol (MIRALAX / GLYCOLAX) packet 17 g  17 g Oral BID Ainsley Spinner, PA-C   17 g at 10/14/20 2122  . senna-docusate (Senokot-S) tablet 2 tablet  2 tablet Oral BID Ainsley Spinner, PA-C   2 tablet at 10/14/20 2122     Discharge Medications: Please see discharge summary for a list of discharge medications.  Relevant Imaging Results:  Relevant Lab Results:   Additional  Information [phone] MWF [facility] [address]  Milinda Antis, LCSWA

## 2020-10-16 DIAGNOSIS — E1122 Type 2 diabetes mellitus with diabetic chronic kidney disease: Secondary | ICD-10-CM | POA: Diagnosis not present

## 2020-10-16 DIAGNOSIS — R509 Fever, unspecified: Secondary | ICD-10-CM | POA: Diagnosis not present

## 2020-10-16 DIAGNOSIS — R410 Disorientation, unspecified: Secondary | ICD-10-CM

## 2020-10-16 DIAGNOSIS — N186 End stage renal disease: Secondary | ICD-10-CM | POA: Diagnosis not present

## 2020-10-16 DIAGNOSIS — I120 Hypertensive chronic kidney disease with stage 5 chronic kidney disease or end stage renal disease: Secondary | ICD-10-CM | POA: Diagnosis not present

## 2020-10-16 LAB — RENAL FUNCTION PANEL
Albumin: 2.2 g/dL — ABNORMAL LOW (ref 3.5–5.0)
Anion gap: 9 (ref 5–15)
BUN: 9 mg/dL (ref 6–20)
CO2: 28 mmol/L (ref 22–32)
Calcium: 8.4 mg/dL — ABNORMAL LOW (ref 8.9–10.3)
Chloride: 97 mmol/L — ABNORMAL LOW (ref 98–111)
Creatinine, Ser: 2.83 mg/dL — ABNORMAL HIGH (ref 0.44–1.00)
GFR, Estimated: 19 mL/min — ABNORMAL LOW (ref >60)
Glucose, Bld: 131 mg/dL — ABNORMAL HIGH (ref 70–99)
Phosphorus: 2.3 mg/dL — ABNORMAL LOW (ref 2.5–4.6)
Potassium: 3.6 mmol/L (ref 3.5–5.1)
Sodium: 134 mmol/L — ABNORMAL LOW (ref 135–145)

## 2020-10-16 LAB — CULTURE, BLOOD (ROUTINE X 2)
Culture: NO GROWTH
Culture: NO GROWTH
Special Requests: ADEQUATE

## 2020-10-16 LAB — GLUCOSE, CAPILLARY
Glucose-Capillary: 135 mg/dL — ABNORMAL HIGH (ref 70–99)
Glucose-Capillary: 131 mg/dL — ABNORMAL HIGH (ref 70–99)
Glucose-Capillary: 120 mg/dL — ABNORMAL HIGH (ref 70–99)

## 2020-10-16 LAB — CBC
HCT: 26.1 % — ABNORMAL LOW (ref 36.0–46.0)
Hemoglobin: 8 g/dL — ABNORMAL LOW (ref 12.0–15.0)
MCH: 27.7 pg (ref 26.0–34.0)
MCHC: 30.7 g/dL (ref 30.0–36.0)
MCV: 90.3 fL (ref 80.0–100.0)
Platelets: 225 10*3/uL (ref 150–400)
RBC: 2.89 MIL/uL — ABNORMAL LOW (ref 3.87–5.11)
RDW: 17.2 % — ABNORMAL HIGH (ref 11.5–15.5)
WBC: 14.2 10*3/uL — ABNORMAL HIGH (ref 4.0–10.5)
nRBC: 0 % (ref 0.0–0.2)

## 2020-10-16 LAB — SARS CORONAVIRUS 2 (TAT 6-24 HRS): SARS Coronavirus 2: NEGATIVE

## 2020-10-16 MED ORDER — LIDOCAINE 5 % EX PTCH: 1.0000 | MEDICATED_PATCH | CUTANEOUS | 0 refills | Status: DC

## 2020-10-16 MED ORDER — POLYETHYLENE GLYCOL 3350 17 G PO PACK: 17.0000 g | PACK | Freq: Two times a day (BID) | ORAL | 0 refills | Status: AC

## 2020-10-16 MED ORDER — SENNOSIDES-DOCUSATE SODIUM 8.6-50 MG PO TABS: 2.0000 | ORAL_TABLET | Freq: Two times a day (BID) | ORAL | 0 refills | Status: DC

## 2020-10-16 MED ORDER — SODIUM CHLORIDE 0.9 % IV SOLN: 125.0000 mg | INTRAVENOUS | Status: DC

## 2020-10-16 MED ORDER — OXYCODONE HCL 5 MG PO TABS: 2.5000 mg | ORAL_TABLET | Freq: Two times a day (BID) | ORAL | 0 refills | Status: AC | PRN

## 2020-10-16 MED ORDER — SODIUM CHLORIDE 0.9 % IV SOLN: 100.0000 mg | INTRAVENOUS | Status: DC

## 2020-10-16 NOTE — TOC Progression Note (Signed)
Transition of Care [facility]) - Progression Note    Patient Details  Name: [name] [name] MRN: [id number] Date of Birth: Sep 26, 1965  [facility]) CM/SW Contact  Milinda Antis, LCSWA Phone Number: 10/16/2020, 12:06 PM  Clinical Narrative:    CSW received insurance Authorization number.  [id number]   Expected Discharge Plan: [place] Barriers to Discharge: SNF Pending bed offer,Insurance Authorization,Continued Medical Work up  Expected Discharge Plan and Services Expected Discharge Plan: [place] Choice: [place] arrangements for the past 2 months: Single Family Home                                       Social Determinants of Health (SDOH) Interventions    Readmission Risk Interventions Readmission Risk Prevention Plan 06/03/2020  Transportation Screening Complete  PCP or Specialist Appt within 3-5 Days Complete  HRI or [place] Complete  Social Work Consult for [place] Planning/Counseling Complete  Palliative Care Screening Not Applicable  Medication Review Press photographer) Complete  Some recent data might be hidden

## 2020-10-16 NOTE — Progress Notes (Signed)
   10/16/20 1442  AVS Discharge Documentation  AVS Discharge Instructions Including Medications Placed in discharge packet for receiving facility  Name of Person Receiving AVS Discharge Instructions Including Medications [facility]  Name of Clinician That Reviewed AVS Discharge Instructions Including Medications Alfredo Martinez, RN   Patient discharged to [facility], via [facility] with all personal belongings. Patient discharge paperwork given to [facility]. Iv's removed prior to D/C. HD catheter intact upon discharge. Patient educated about discharge planning and verbalized understanding of teaching.

## 2020-10-16 NOTE — Progress Notes (Signed)
[facility] Progress Note   HD orders: [facility] 242mn BFR 350 DFR 600 EDW 62kg 2K/2.5Ca F180, heparin 5000 bolus and 500/hr maint  Assessment/ Plan:   **Hip fracture: s/p operative management per ortho. PT eval to determine dispo. Please avoid morphine in this ESRD patient. No evidence of DVT although limited exam on 10/06/20  ** L prox tibia fracture- ORIF bicondylar tibial plateau 10/09/20.   **ESRD on HD MWF: s/p HD Friday per schedule. Continue MWF schedule. L IJ TDC hadn't been able to run properly ->  appreciate  VIR exchanging it on 4/11 noted to have a fibrin sheath which was disrupted. Now catheter working great.  Last HD 4/13 net UF 895.  - She continues to be much more alert;  uremia certainly was  contributing to the AMS.   - Plan on next HD Fri; no indication for additional treatment today  **BMM: clarified - she takes ca carbonate 2 TIDAC as binder, Phos - 6.5, resumed home binder; corr ca ok.   **DM: insulin per primary  **HTN: continue home meds; per outpt HD records frequent HTN.   **Anemia: Hb 10.6 >9.8 >9.6. post op. Trend. Could use IV iron; last fever 4/9 and BCx on 4/9 NTD. Will replete.     Subjective:   No fevers overnight; blood cultures negative to date (from 4/9). She's more alert.   denies f/c/n/v/dyspnea   Objective:   BP (!) 120/59 (BP Location: Left Arm)   Pulse 63   Temp 98 F (36.7 C) (Oral)   Resp 16   Ht 5' (1.524 m)   Wt 65 kg   SpO2 98%   BMI 27.99 kg/m   Intake/Output Summary (Last 24 hours) at 10/16/2020 0839 Last data filed at 10/15/2020 1111 Gross per 24 hour  Intake --  Output 895 ml  Net -895 ml   Weight change:   Physical Exam: Gen: in bed and much more arousable today answering questions appropriately CV:RRR Lungs: clear ant Extr: No edema Neuro:nonfocal Skin:LIJTDCc/d/i  Imaging: No results found.  Labs: BMET Recent Labs  Lab 10/10/20 0342 10/11/20 0201  10/12/20 0244 10/13/20 0246 10/14/20 0255 10/15/20 0317 10/16/20 0402  NA 133* 133* 131* 129* 134* 133* 134*  K 4.5 4.0 5.2* 4.5 3.4* 4.3 3.6  CL 97* 100 99 97* 98 97* 97*  CO2 27 27 21* '26 28 27 28  '$ GLUCOSE 167* 120* 90 149* 120* 120* 131*  BUN 35* 27* 38* 45* 14 22* 9  CREATININE 5.22* 3.89* 5.20* 6.47* 2.77* 4.63* 2.83*  CALCIUM 8.8* 8.4* 8.4* 8.4* 7.9* 8.2* 8.4*  PHOS 5.0* 3.0 3.4 4.0 2.2* 3.7 2.3*   CBC Recent Labs  Lab 10/11/20 0201 10/12/20 1246 10/13/20 0246 10/14/20 0255 10/15/20 0317 10/16/20 0402  WBC 7.3   < > 9.3 10.5 9.6 14.2*  NEUTROABS 2.8  --   --   --   --   --   HGB 8.2*   < > 7.8* 7.8* 7.4* 8.0*  HCT 25.7*   < > 24.9* 25.2* 24.3* 26.1*  MCV 87.7   < > 87.7 90.3 91.0 90.3  PLT 177   < > 227 192 233 225   < > = values in this interval not displayed.    Medications:    . calcium carbonate  2 tablet Oral TID AC  . Chlorhexidine Gluconate Cloth  6 each Topical Q0600  . Chlorhexidine Gluconate Cloth  6 each Topical Q0600  . darbepoetin (ARANESP) injection - DIALYSIS  150 mcg Intravenous Q Fri-HD  . heparin injection (subcutaneous)  5,000 Units Subcutaneous Q8H  . lidocaine  1 patch Transdermal Q24H  . polyethylene glycol  17 g Oral BID  . senna-docusate  2 tablet Oral BID      Otelia Santee, MD 10/16/2020, 8:39 AM

## 2020-10-16 NOTE — Plan of Care (Signed)
  Problem: Education: Goal: Verbalization of understanding the information provided (i.e., activity precautions, restrictions, etc) will improve Outcome: Adequate for Discharge Goal: Individualized Educational Video(s) Outcome: Adequate for Discharge   Problem: Activity: Goal: Ability to ambulate and perform ADLs will improve Outcome: Adequate for Discharge   Problem: Clinical Measurements: Goal: Postoperative complications will be avoided or minimized Outcome: Adequate for Discharge   Problem: Self-Concept: Goal: Ability to maintain and perform role responsibilities to the fullest extent possible will improve Outcome: Adequate for Discharge   Problem: Pain Management: Goal: Pain level will decrease Outcome: Adequate for Discharge   Problem: Education: Goal: Knowledge of General Education information will improve Description: Including pain rating scale, medication(s)/side effects and non-pharmacologic comfort measures Outcome: Adequate for Discharge   Problem: Health Behavior/Discharge Planning: Goal: Ability to manage health-related needs will improve Outcome: Adequate for Discharge   Problem: Clinical Measurements: Goal: Ability to maintain clinical measurements within normal limits will improve Outcome: Adequate for Discharge Goal: Will remain free from infection Outcome: Adequate for Discharge Goal: Diagnostic test results will improve Outcome: Adequate for Discharge Goal: Respiratory complications will improve Outcome: Adequate for Discharge Goal: Cardiovascular complication will be avoided Outcome: Adequate for Discharge   Problem: Activity: Goal: Risk for activity intolerance will decrease Outcome: Adequate for Discharge   Problem: Nutrition: Goal: Adequate nutrition will be maintained Outcome: Adequate for Discharge   Problem: Coping: Goal: Level of anxiety will decrease Outcome: Adequate for Discharge   Problem: Elimination: Goal: Will not experience  complications related to bowel motility Outcome: Adequate for Discharge Goal: Will not experience complications related to urinary retention Outcome: Adequate for Discharge   Problem: Pain Managment: Goal: General experience of comfort will improve Outcome: Adequate for Discharge   Problem: Safety: Goal: Ability to remain free from injury will improve Outcome: Adequate for Discharge   Problem: Skin Integrity: Goal: Risk for impaired skin integrity will decrease Outcome: Adequate for Discharge

## 2020-10-16 NOTE — Progress Notes (Incomplete)
HD#14 Subjective:   No acute events overnight.   ***  Objective:   Vital signs in last 24 hours: Vitals:   10/15/20 1220 10/15/20 1535 10/15/20 2010 10/16/20 0420  BP: (!) 134/56 139/67 127/63 (!) 120/59  Pulse: 64 63 64 63  Resp: '16 17 18 16  '$ Temp: 98.8 F (37.1 C) 98.2 F (36.8 C) 98.1 F (36.7 C) 98 F (36.7 C)  TempSrc: Oral Oral Oral Oral  SpO2: 96% 97% 97% 98%  Weight:      Height:       Physical Exam General: chronically ill appearing, in NAD, sleeping initially however arouses to voice, more alert this morning Skin: no erythema or edema overlying the left subclavian line or right hip's surgical incision. Bilateral lower extremities in stabilizing/pressure-offloading boots Neuro: oriented to person, place, time, and situation  Assessment/Plan:   Principal Problem:   Fracture of femoral neck, right (HCC) Active Problems:   ESRD (end stage renal disease) ([place])   Closed fracture of left proximal tibia   Fever   Osteoporosis   Patient Summary:  [name] [name] is a 55 y.o. woman with history of ESRD on HD MWF, diabetes type 2, hypertension, NAFLD, GERD, history of recurrent UTIs, and chronic bilateral lower extremity weakness who presented after a fall and found to have a R femoral neck fracture. She underwent percutaneous pinning on 10/02/20. On 10/07/20 she was found to have a right tibia and fibular fracture and subsequently underwent an ORIF on 4/7. She is now awaiting placement at a skilled facility for ongoing rehabilitation. Placement is complicated by logistics of securing transportation to and from dialysis.  This is hospital day 14.  Right femoral neck fracture s/p percutaneous pinning 3/31  Mobility restrictions: WBAT for transfers only; unrestricted ROM R hip and knee R proximal tibia and fibular fractures s/p ORIF 4/7. Per ortho, suspect patient had stress fracture that completed upon weight-bearing activity.  Mobility restrictions: NWB on L leg,  unrestricted ROM L knee, no bracing Therapy: PT/OT Dressing changes prn Pain management: oxycodone 2.5 mg BID prn, lidocaine patch Follow-up needs: suture removal for R hip ~4/14  Delirium, improving Patient's mental status significantly improved this morning. Per family, she is at her baseline mental status this morning. Since she has had waxing and waning alertness over the last few days, will continue to monitor closely for mental status changes. Pain regimen deescalated further yesterday. Will give dulcolax suppository today as patient has not had BM since 4/9. Low suspicion for infection at this time. Suspect long hospital stay is contributing. - Delirium precautions - Deescalate pain regimen as above  - Continue bowel regimen, dulcolax suppository today  ESRD on HD MWF Osteoporosis Management per nephrology. S/p exchange of left IJ tunneled HD catheter by IR (4/11). HD today.   Post operative anemia on anemia of CKD. Hemoglobin between 7.4-9.7 over the last 5 days. Continue to monitor.  Constipation  Intermittent this admission. Last BM 4/9. Will continue daily bowel regimen (Miralax and Senokot-S). Dulcolax suppository today.   Fever (4/9 AM).  Afebrile since. Blood cultures obtained on 4/9 in anticipation of cath exchange NGTD.   Type 2 diabetes mellitus. Glucoses have been at goal even since discontinuing insulin (Lantus 5 u). Will continue to hold at this time and consider resumption as needed.   Essential hypertension. BP improved since stopping antihypertensives. Will monitor closely and add back as needed.  - Continue to hold antihypertensives (Isordil, amlodipine, and hydralazine)  - Holding  Coreg in setting of mild bradycardia and normotension.   VTE: Heparin Code: Full Dispo: Patient is medically stable for discharge to SNF. Our social work Medical laboratory scientific officer and renal navigator are working to arrange for placement to SNF and securing transportation.   Alexandria Lodge,  MD Internal Medicine Resident, PGY-1 [facility] Pager: [phone] 7:32 AM, 10/16/2020   Please contact the on call pager after 5 pm and on weekends at [phone].

## 2020-10-16 NOTE — Progress Notes (Signed)
Renal Navigator faxed Renal Note and Discharge Summary to patient's outpatient HD clinic and called to inform that patient will discharge to [facility] SNF who can transport patient to her regular seat time in order to provide continuity of care. Patient scheduled to discharge today, so she should return to clinic tomorrow for her next treatment. Navigator greatly appreciates coordination of care by CSW/R. Blade.  Alphonzo Cruise, [place] Renal Navigator [phone]

## 2020-10-16 NOTE — TOC Transition Note (Signed)
Transition of Care [facility]) - CM/SW Discharge Note   Patient Details  Name: [name] [name] MRN: [id number] Date of Birth: 05-19-1966  Transition of Care [facility]) CM/SW Contact:  Milinda Antis, [place] Phone Number: 10/16/2020, 2:40 PM   Clinical Narrative:    Patient will DC to:  [facility] date: 10/16/2020 Family notified:  yes Transport by: Corey Harold  Per MD patient ready for DC to SNF . RN to call report prior to discharge [phone].    RN, patient, patient's family, and facility notified of DC. Discharge Summary and FL2 sent to facility. DC packet on chart. Ambulance transport requested for patient.   CSW will sign off for now as social work intervention is no longer needed. Please consult [place] again if new needs arise.     Final next level of care: Skilled Nursing Facility Barriers to Discharge: Barriers Resolved   Patient Goals and CMS Choice Patient states their goals for this hospitalization and ongoing recovery are:: Pt agreeable to Short term SNF placement. CMS Medicare.gov Compare Post Acute Care list provided to:: Patient Choice offered to / list presented to : Patient  Discharge Placement PASRR number recieved: 10/13/20 Existing PASRR number confirmed : 10/16/20          Patient chooses bed at:  ([facility]) Patient to be transferred to facility by: [facility] Name of family member notified: Omar Person, son, and Kalysta Humphrey, husband Patient and family notified of of transfer: 10/16/20  Discharge Plan and Services     Post Acute Care Choice: [place]                               Social Determinants of Health (SDOH) Interventions     Readmission Risk Interventions Readmission Risk Prevention Plan 06/03/2020  Transportation Screening Complete  PCP or Specialist Appt within 3-5 Days Complete  HRI or [place] Complete  Social Work Consult for [place] Planning/Counseling  Complete  Palliative Care Screening Not Applicable  Medication Review Press photographer) Complete  Some recent data might be hidden

## 2020-10-29 NOTE — Op Note (Signed)
OPERATIVE REPORT 10/02/2020  9:52 AM  PATIENT:  [name] [name]  55 y.o. female  PRE-OPERATIVE DIAGNOSIS: IMPACTED RIGHT FEMORAL NECK FRACTURE    POST-OPERATIVE DIAGNOSIS:  IMPACTED RIGHT FEMORAL NECK FRACTURE  PROCEDURE: CANNULATED SCREW FIXATION OF RIGHT FEMORAL NECK FRACTURE WITH 8.0 MM BIOMET CANNULATED SCREWS WITH STAR DRIVE HEADS  SURGEON:  Surgeon(s) and Role:    Altamese Soledad, MD - Primary  PHYSICIAN ASSISTANT: None  ANESTHESIA:   general  I/O:  No intake/output data recorded.  SPECIMEN:  No Specimen  TOURNIQUET: NONE  COMPLICATIONS: NONE  DICTATION: Note written in EPIC  DISPOSITION: TO PACU  CONDITION: STABLE  DELAY START OF DVT PROPHYLAXIS BECAUSE OF BLEEDING RISK: NO   BRIEF SUMMARY OF INDICATIONS FOR PROCEDURE:  [name] [name] is a very pleasant 55 y.o. who sustained a ground level fall resulting in hip pain, inability to bear weight. Subsequent x-rays demonstrated a comminuted valgus impacted femoral neck fracture.  We did discuss the risks and benefits of surgical fixation including the possibility of avascular necrosis, nonunion, malunion, loss of fixation, need for conversion to total hip arthroplasty or other surgery, DVT, PE, heart attack, stroke, loss of motion, and multiple others including infection, and symptomatic hardware.  After full discussion of these risks and others, the patient wished to proceed.  DESCRIPTION OF PROCEDURE:  The patient was taken to the operating room where general anesthesia was induced.  Preoperative antibiotics consisting of Ancef were administered. The patient was very carefully positioned on the radiolucent table with a bump under the pelvis on the side of the fracture. C-arm was brought in to confirm appropriate images and reduction. Standard prep and drape were then performed using chlorhexidine scrub, followed by Betadine scrub and paint.  C-arm was brought in to confirm the  appropriate starting position for a 4 cm incision and checked on both AP and lateral planes. The incision was made.  Dissection carried carefully down to the tensor Which was split in line to expose the vastus lateralis.  The guide pin for these screws was then placed inferior and central and advanced along the calcar into the femoral head checking it on 2 views. Two additional guide pins were then placed superior to this, one superior anterior and the other superior posterior. Began with the inferior screw, drilling the lateral cortex, then advancing the threads across the fracture site and engaging it, checking under C-arm for compression. Additionally 2 screws were placed proximally achieving excellent fixation with inverted triangle pattern across the femoral neck into the head.  There was outstanding bite in the femoral head as well.  Wound was irrigated thoroughly.  I did split and spread the tensor and was able to repair this back with a simple 0 Vicryl sutures.  The deep subcu was repaired with #1 Vicryl and then a 2-0 Vicryl and a 3-0 nylon horizontal mattress for the skin layer.  Sterile gently compressive dressing was applied.  The patient was awakened from anesthesia and transported to the PACU in stable condition.   PROGNOSIS:  Patient will be partial weightbearing on the right lower extremity with walker and will be allowed to proceed with discharge to home as soon cleared by PT.  Anticipate being on DVT prophylaxis with Lovenox and touchdown weightbearing for 6 weeks.  Would anticipate some form of bicycle use within a couple of weeks.  She is at risk for nonunion and avascular necrosis with the potential for subsequent total hip arthroplasty conversion.  The patient and  the family are aware of this.     [name]. Marcelino Scot, M.D.

## 2021-01-06 ENCOUNTER — Emergency Department (HOSPITAL_BASED_OUTPATIENT_CLINIC_OR_DEPARTMENT_OTHER)
Admission: EM | Admit: 2021-01-06 | Discharge: 2021-01-06 | Disposition: A | Discharge status: Home / Self Care | Payer: Medicare Other | Attending: Emergency Medicine | Admitting: Emergency Medicine

## 2021-01-06 ENCOUNTER — Other Ambulatory Visit: Payer: Self-pay

## 2021-01-06 ENCOUNTER — Emergency Department (HOSPITAL_BASED_OUTPATIENT_CLINIC_OR_DEPARTMENT_OTHER): Payer: Medicare Other

## 2021-01-06 ENCOUNTER — Encounter (HOSPITAL_BASED_OUTPATIENT_CLINIC_OR_DEPARTMENT_OTHER): Payer: Self-pay | Admitting: Emergency Medicine

## 2021-01-06 DIAGNOSIS — B3731 Acute candidiasis of vulva and vagina: Secondary | ICD-10-CM

## 2021-01-06 DIAGNOSIS — Z992 Dependence on renal dialysis: Secondary | ICD-10-CM | POA: Insufficient documentation

## 2021-01-06 DIAGNOSIS — M25551 Pain in right hip: Secondary | ICD-10-CM | POA: Diagnosis not present

## 2021-01-06 DIAGNOSIS — E1122 Type 2 diabetes mellitus with diabetic chronic kidney disease: Secondary | ICD-10-CM | POA: Diagnosis not present

## 2021-01-06 DIAGNOSIS — I120 Hypertensive chronic kidney disease with stage 5 chronic kidney disease or end stage renal disease: Secondary | ICD-10-CM | POA: Diagnosis not present

## 2021-01-06 DIAGNOSIS — N939 Abnormal uterine and vaginal bleeding, unspecified: Secondary | ICD-10-CM | POA: Diagnosis not present

## 2021-01-06 DIAGNOSIS — N186 End stage renal disease: Secondary | ICD-10-CM | POA: Insufficient documentation

## 2021-01-06 DIAGNOSIS — Z9851 Tubal ligation status: Secondary | ICD-10-CM | POA: Diagnosis not present

## 2021-01-06 DIAGNOSIS — B373 Candidiasis of vulva and vagina: Secondary | ICD-10-CM | POA: Insufficient documentation

## 2021-01-06 DIAGNOSIS — E1140 Type 2 diabetes mellitus with diabetic neuropathy, unspecified: Secondary | ICD-10-CM | POA: Diagnosis not present

## 2021-01-06 LAB — CBC WITH DIFFERENTIAL/PLATELET
Abs Immature Granulocytes: 0 10*3/uL (ref 0.00–0.07)
Basophils Absolute: 0.2 10*3/uL — ABNORMAL HIGH (ref 0.0–0.1)
Basophils Relative: 2 %
Eosinophils Absolute: 0.1 10*3/uL (ref 0.0–0.5)
Eosinophils Relative: 1 %
HCT: 43 % (ref 36.0–46.0)
Hemoglobin: 13.2 g/dL (ref 12.0–15.0)
Lymphocytes Relative: 72 %
Lymphs Abs: 8.2 10*3/uL — ABNORMAL HIGH (ref 0.7–4.0)
MCH: 25.2 pg — ABNORMAL LOW (ref 26.0–34.0)
MCHC: 30.7 g/dL (ref 30.0–36.0)
MCV: 82.1 fL (ref 80.0–100.0)
Monocytes Absolute: 0.6 10*3/uL (ref 0.1–1.0)
Monocytes Relative: 5 %
Neutro Abs: 2.3 10*3/uL (ref 1.7–7.7)
Neutrophils Relative %: 20 %
Platelets: 188 10*3/uL (ref 150–400)
RBC: 5.24 MIL/uL — ABNORMAL HIGH (ref 3.87–5.11)
RDW: 18.7 % — ABNORMAL HIGH (ref 11.5–15.5)
WBC: 11.4 10*3/uL — ABNORMAL HIGH (ref 4.0–10.5)
nRBC: 0 % (ref 0.0–0.2)

## 2021-01-06 LAB — COMPREHENSIVE METABOLIC PANEL
ALT: 11 U/L (ref 0–44)
AST: 16 U/L (ref 15–41)
Albumin: 3.6 g/dL (ref 3.5–5.0)
Alkaline Phosphatase: 113 U/L (ref 38–126)
Anion gap: 9 (ref 5–15)
BUN: 39 mg/dL — ABNORMAL HIGH (ref 6–20)
CO2: 28 mmol/L (ref 22–32)
Calcium: 9.3 mg/dL (ref 8.9–10.3)
Chloride: 100 mmol/L (ref 98–111)
Creatinine, Ser: 4.45 mg/dL — ABNORMAL HIGH (ref 0.44–1.00)
GFR, Estimated: 11 mL/min — ABNORMAL LOW (ref >60)
Glucose, Bld: 136 mg/dL — ABNORMAL HIGH (ref 70–99)
Potassium: 4.7 mmol/L (ref 3.5–5.1)
Sodium: 137 mmol/L (ref 135–145)
Total Bilirubin: 0.3 mg/dL (ref 0.3–1.2)
Total Protein: 8.1 g/dL (ref 6.5–8.1)

## 2021-01-06 LAB — WET PREP, GENITAL
Clue Cells Wet Prep HPF POC: NONE SEEN
Sperm: NONE SEEN
Trich, Wet Prep: NONE SEEN

## 2021-01-06 MED ORDER — FLUCONAZOLE 150 MG PO TABS
150.0000 mg | ORAL_TABLET | Freq: Once | ORAL | Status: AC
  Administered 2021-01-06: 150 mg via ORAL
  Filled 2021-01-06: qty 1

## 2021-01-06 NOTE — Discharge Instructions (Addendum)
You came to the emergency department today to be evaluated for your vaginal bleeding.  The ultrasound of your pelvis did not visualize your ovaries or uterus.  We did find that you had a yeast infection.  You were treated with Diflucan here in the emergency department.  Because of your bleeding I have given you information to follow-up with the OB/GYN provider.  Please call them tomorrow to schedule an appointment soon as possible.    You were noted to have a high blood pressure while in the emergency department.  Please follow-up with your primary care provider regarding further management of your high blood pressure.  Your blood work showed that you had some abnormal red blood cells (target and smudge) seen on your blood work.  Please follow-up with your primary care provider to have this blood test repeated.  Get help right away if: You have a fever or chills. You have severe pain with bleeding. You are passing blood clots. You have heavy bleeding, need more than 1 pad an hour, and have never experienced this before. You have headaches or feel faint or dizzy.

## 2021-01-06 NOTE — ED Notes (Signed)
This RN attempted stick for blood, unsuccessful

## 2021-01-06 NOTE — ED Notes (Signed)
AWAITING PTAR FOR [place]

## 2021-01-06 NOTE — ED Provider Notes (Signed)
[name] DE[facility]MENT Provider Note   CSN: [id number] Arrival date & time: 01/06/21  1504     History Chief Complaint  Patient presents with   Vaginal Bleeding    [name] [name] is a 55 y.o. female with a history of end-stage renal disease on dialysis M/W/F, diabetes mellitus, hypertension.    Patient presents emergency department with a chief complaint of vaginal bleeding.  Patient reports that she has had vaginal bleeding for the last month.  Patient reports that bleeding has gotten progressively worse over this time.  Patient has not been using any pads/tampons however does wear a diaper.  Patient reports that she has to change her diaper twice a day and has been noticing blood in her diaper.  Patient reports she developed pain around her vulva yesterday evening.  Patient rates his pain 6/10 on the pain scale.  Pain has been constant.  Patient has not tried any modalities to alleviate her pain.  Pain was worse to touch.  Patient also endorses vaginal discharge and describes discharge as green.  Patient reports that she has not been sexually active in the last 5 to 6 years.  Denies any vaginal pruritus.  Patient is anuric.    Patient denies any abdominal pain, nausea, vomiting, diarrhea, blood in stool, melena, fevers, chills, unexpected weight loss, night sweats, fatigue, chest pain, shortness of breath, syncope.  Patient also endorses pain to her right hip.  Denies any recent falls or injuries.  States that this pain is baseline for her after fall and surgery in March and April of this year.  Patient reports that she is nonambulatory since this fall.  Per chart review patient had right femoral neck fracture status post percutaneous pinning on 10/02/2020 performed by Dr. Marcelino Scot, right proximal tibia and fibular fracture status post ORIF 10/09/2020 performed by Dr. Marcelino Scot.    Patient last received dialysis yesterday.     Vaginal Bleeding Associated symptoms: vaginal  discharge   Associated symptoms: no abdominal pain, no back pain, no dizziness, no fatigue, no fever and no nausea       Past Medical History:  Diagnosis Date   Colitis    Diabetes mellitus    Family history of adverse reaction to anesthesia    " my uncle's heart stoped "   Fracture of femoral neck, right ([place]) 10/02/2020   GERD (gastroesophageal reflux disease)    Heart murmur    Hypertension    Impaired vision in both eyes 11/19/2015   Mitral regurgitation    Neuropathy in diabetes ([place])    Syncope and collapse 07/28/2015    Patient Active Problem List   Diagnosis Date Noted   Osteoporosis 10/13/2020   Fever    Closed fracture of left proximal tibia 10/10/2020   Fracture of femoral neck, right ([place]) 10/02/2020   Pressure injury of skin 03/12/2020   Sepsis ([place]) 03/07/2020   ESRD (end stage renal disease) ([place]) 03/07/2020   Hyperkalemia 03/07/2020   Acute cystitis [id number]   Acute metabolic encephalopathy [id number]   Impaired vision in both eyes 11/19/2015   Diabetes mellitus with complication (H[facility])    Intractable headache    Syncope 07/28/2015   Chronic headaches 07/28/2015   Chest pain 05/22/2015   Urinary frequency 03/13/2015   Easy bruising 10/09/2014   Encounter for preventative adult health care exam with abnormal findings 01/11/2014   Chronic constipation 01/11/2014   GERD (gastroesophageal reflux disease) 0000000   Helicobacter positive gastritis 12/06/2013   Dizziness  and giddiness 10/12/2013   Essential hypertension 09/19/2013   Back pain 06/11/2013   Lumbosacral spondylosis without myelopathy 02/20/2013   Diabetic neuropathy ([place]) 01/09/2013   DM (diabetes mellitus), type 2, uncontrolled ([place]) 10/02/2012   MITRAL REGURGITATION, mild 10/31/2009   PLEURAL EFFUSION, RIGHT after pyelonephritis in April 2011 (no intervention) 10/20/2009    Past Surgical History:  Procedure Laterality Date   ABDOMINAL HYSTERECTOMY     CATARACT EXTRACTION      CESAREAN SECTION     CHOLECYSTECTOMY     EYE SURGERY Left retinal   GANGLION CYST EXCISION     HIP PINNING,CANNULATED Right 10/02/2020   Procedure: CANNULATED HIP PINNING;  Surgeon: Altamese Vandergrift, MD;  Location: [facility];  Service: Orthopedics;  Laterality: Right;   IR FLUORO GUIDE CV LINE LEFT  10/13/2020   IR PTA VENOUS EXCEPT DIALYSIS CIRCUIT  10/13/2020   ORIF TIBIA PLATEAU Left 10/09/2020   Procedure: OPEN REDUCTION INTERNAL FIXATION (ORIF) TIBIAL PLATEAU;  Surgeon: Altamese [place], MD;  Location: [facility];  Service: Orthopedics;  Laterality: Left;   TONSILLECTOMY     TUBAL LIGATION       OB History   No obstetric history on file.     Family History  Problem Relation Age of Onset   Diabetes Father    Heart attack Father    Aneurysm Mother     Social History   Tobacco Use   Smoking status: Never   Smokeless tobacco: Never  Substance Use Topics   Alcohol use: No    Alcohol/week: 0.0 standard drinks   Drug use: No    Home Medications Prior to Admission medications   Medication Sig Start Date End Date Taking? Authorizing Provider  Ascorbic Acid (VITAMIN C) 100 MG tablet Take 100 mg by mouth daily.    [provider]  atropine 1 % ophthalmic solution Place 1 drop into the right eye 3 (three) times daily.    [provider]  B Complex-C (B-COMPLEX WITH VITAMIN C) tablet Take 1 tablet by mouth daily.    [provider]  BISACODYL PO Take 5 mg by mouth daily as needed (constipation).    [provider]  gabapentin (NEURONTIN) 100 MG capsule Take 200 mg by mouth at bedtime as needed (pain).    [provider]  gabapentin (NEURONTIN) 100 MG capsule Take 100 mg by mouth daily as needed (pain).    [provider]  LACTOBACILLUS EXTRA STRENGTH PO Take 1 capsule by mouth daily.    [provider]  lidocaine (LIDODERM) 5 % Place 1 patch onto the skin daily. Remove & Discard patch within 12 hours or as directed by MD 10/16/20    Alexandria Lodge, MD  Magnesium 400 MG TABS Take 400 mg by mouth 2 (two) times daily.    [provider]  methocarbamol (ROBAXIN) 500 MG tablet TAKE 1 TABLET (500 MG TOTAL) BY MOUTH EVERY SIX HOURS AS NEEDED FOR MUSCLE SPASMS AND HEADACHE. 06/03/20 06/03/21  [place] Eric J, DO  senna-docusate (SENOKOT-S) 8.6-50 MG tablet Take 2 tablets by mouth 2 (two) times daily. 10/16/20   Alexandria Lodge, MD  SUMAtriptan (IMITREX) 100 MG tablet TAKE 1 TABLET (100 MG TOTAL) BY MOUTH ONCE AS NEEDED FOR MIGRAINE. MAY REPEAT IN 2 HOURS IF HEADACHE PERSISTS OR RECURS. 06/03/20 06/03/21  [place] Donnamarie Poag, DO    Allergies    Amitriptyline, Ciprofloxacin, Semaglutide, Tramadol, and Tylenol [acetaminophen]  Review of Systems   Review of Systems  Constitutional:  Negative  for chills, fatigue, fever and unexpected weight change.  Eyes:  Negative for visual disturbance.  Respiratory:  Negative for shortness of breath.   Cardiovascular:  Negative for chest pain.  Gastrointestinal:  Negative for abdominal pain, anal bleeding, blood in stool, diarrhea, nausea, rectal pain and vomiting.  Genitourinary:  Positive for vaginal bleeding, vaginal discharge and vaginal pain. Negative for difficulty urinating and pelvic pain.  Musculoskeletal:  Positive for arthralgias (baseline right hip pain). Negative for back pain and neck pain.  Skin:  Negative for color change and rash.  Neurological:  Negative for dizziness, syncope, light-headedness and headaches.  Psychiatric/Behavioral:  Negative for confusion.    Physical Exam Updated Vital Signs BP (!) 178/92 (BP Location: Right Arm)   Pulse 79   Temp 99 F (37.2 C) (Oral)   Resp 18   Ht 5' (1.524 m)   Wt 61.2 kg   SpO2 100%   BMI 26.37 kg/m   Physical Exam Vitals and nursing note reviewed. Exam conducted with a chaperone present (Female RN present as chaperone).  Constitutional:      General: She is not in acute distress.    Appearance: She is not ill-appearing,  toxic-appearing or diaphoretic.  HENT:     Head: Normocephalic.  Eyes:     General: No scleral icterus.       Right eye: No discharge.        Left eye: No discharge.  Cardiovascular:     Rate and Rhythm: Normal rate.  Pulmonary:     Effort: Pulmonary effort is normal.  Abdominal:     General: There is no distension. There are no signs of injury.     Palpations: Abdomen is soft. There is no mass or pulsatile mass.     Tenderness: There is no abdominal tenderness. There is no guarding or rebound.     Hernia: There is no hernia in the umbilical area, ventral area, left inguinal area or right inguinal area.  Genitourinary:    Pubic Area: No rash or pubic lice.      Tanner stage (genital): 5.     Labia:        Right: No rash, tenderness, lesion or injury.        Left: No rash, tenderness, lesion or injury.      Vagina: No signs of injury and foreign body. Vaginal discharge present. No erythema, tenderness, bleeding, lesions or prolapsed vaginal walls.     Cervix: No cervical motion tenderness, discharge, friability, lesion, erythema or eversion.     Uterus: Absent.      Adnexa:        Right: Tenderness present. No mass or fullness.         Left: No mass, tenderness or fullness.       Comments: Copious amounts of thick white discharge noted in vaginal vault, no bleeding noted Lymphadenopathy:     Lower Body: No right inguinal adenopathy. No left inguinal adenopathy.  Skin:    General: Skin is warm and dry.  Neurological:     General: No focal deficit present.     Mental Status: She is alert.  Psychiatric:        Behavior: Behavior is cooperative.    ED Results / Procedures / Treatments   Labs (all labs ordered are listed, but only abnormal results are displayed) Labs Reviewed  WET PREP, GENITAL - Abnormal; Notable for the following components:      Result Value   Yeast Wet Prep HPF  POC PRESENT (*)    WBC, Wet Prep HPF POC MANY (*)    All other components within normal limits   CBC WITH DIFFERENTIAL/PLATELET - Abnormal; Notable for the following components:   WBC 11.4 (*)    RBC 5.24 (*)    MCH 25.2 (*)    RDW 18.7 (*)    Lymphs Abs 8.2 (*)    Basophils Absolute 0.2 (*)    All other components within normal limits  COMPREHENSIVE METABOLIC PANEL - Abnormal; Notable for the following components:   Glucose, Bld 136 (*)    BUN 39 (*)    Creatinine, Ser 4.45 (*)    GFR, Estimated 11 (*)    All other components within normal limits  PATHOLOGIST SMEAR REVIEW    EKG None  Radiology US Pelvis Complete  Result Date: 01/06/2021 CLINICAL DATA:  Vaginal bleeding. Hysterectomy in the 90s. Dialysis. EXAM: TRANSABDOMINAL ULTRASOUND OF PELVIS TECHNIQUE: Transabdominal ultrasound examination of the pelvis was performed including evaluation of the uterus, ovaries, adnexal regions, and pelvic cul-de-sac. COMPARISON:  None. FINDINGS: Uterus Status post hysterectomy. Right ovary Not visualized. Left ovary Not visualized. Other findings: No abnormal free fluid. Under disc urinary bladder with debris noted within its lumen and question of urinary bladder wall thickening. IMPRESSION: 1. Transabdominal ultrasound only in a patient status post hysterectomy with bilateral ovaries not visualized. 2. Under distended urinary bladder with debris within its lumen and question of urinary bladder wall thickening. Correlate with urinalysis for infection/hematuria. Electronically Signed   By: Iven Finn M.D.   On: 01/06/2021 17:31    Procedures Procedures   Medications Ordered in ED Medications  fluconazole (DIFLUCAN) tablet 150 mg (150 mg Oral Given 01/06/21 1800)    ED Course  I have reviewed the triage vital signs and the nursing notes.  Pertinent labs & imaging results that were available during my care of the patient were reviewed by me and considered in my medical decision making (see chart for details).    MDM Rules/Calculators/A&P                          Alert 55 year old  female no acute distress, nontoxic-appearing.  Patient presents emerged part with a chief plaint of vaginal bleeding.  Patient reports that she has had vaginal bleeding over the last month however this bleeding has gotten progressively worse.  Patient is unable to estimate how much blood loss.  Patient does not use pads or tampons.  Patient reports that she has not been sexually active in the last 5 to 6 years.  Patient is status post hysterectomy.  Patient is unsure if she had her ovaries removed as well.  Abdomen soft, nondistended, nontender.  Patient has tenderness to right adnexa without mass, adenopathy, or hernia.  Complains of thick white discharge noted in vaginal vault.  No bleeding noted to vaginal vault.  Will obtain CMP, CBC, wet prep, and pelvic ultrasound.  CBC shows no signs of anemia.  Peripheral notes large granular lymphocytes, smudge cells, and target cells.  These are not previously documented.  Pathology report pending at this time.  We will have patient follow-up with primary care provider for repeat testing and further work-up.  CMP shows increased creatinine and BUN likely secondary to patient's end-stage renal disease.  Wet prep shows yeast present.  Decision making with patient about treatment options.  Patient elects or fluconazole single dose at this time.   Unable to obtain urinalysis as  patient is anuric. Ultrasound shows status post hysterectomy with bilateral ovaries not visualized.  Under distended urinary bladder with debris within its lumen and question of urinary bladder wall thickening.  Will have patient follow-up with OB/GYN provider.  Patient given strict return precautions.  Patient expressed understanding of all instructions and is agreeable with this plan.   Final Clinical Impression(s) / ED Diagnoses Final diagnoses:  Vaginal bleeding  Vulvovaginal candidiasis    Rx / DC Orders ED Discharge Orders     None        Loni Beckwith,  PA-C 01/06/21 2349    Lennice Sites, DO 01/07/21 1658

## 2021-01-06 NOTE — ED Triage Notes (Addendum)
Pt presents to ED BIB [facility] from home. Pt c/o vaginal bleeding and pain x4w. Pt also c/o hip pain hx of recent hip surgery. Pt had partial hysterectomy years ago for fibroids. Pt non ambulatory at baseline since fall in April. Dialysis pt m/w/f Ems VS -  178/110

## 2021-01-08 LAB — PATHOLOGIST SMEAR REVIEW

## 2021-04-08 ENCOUNTER — Emergency Department (HOSPITAL_COMMUNITY): Payer: Medicare Other

## 2021-04-08 ENCOUNTER — Other Ambulatory Visit: Payer: Self-pay

## 2021-04-08 ENCOUNTER — Encounter (HOSPITAL_COMMUNITY): Payer: Self-pay | Admitting: Emergency Medicine

## 2021-04-08 ENCOUNTER — Inpatient Hospital Stay (HOSPITAL_COMMUNITY)
Admission: EM | Admit: 2021-04-08 | Discharge: 2021-04-14 | DRG: 746 | Disposition: A | Discharge status: Home Health | Payer: Medicare Other | Attending: Internal Medicine | Admitting: Internal Medicine

## 2021-04-08 DIAGNOSIS — I340 Nonrheumatic mitral (valve) insufficiency: Secondary | ICD-10-CM | POA: Diagnosis present

## 2021-04-08 DIAGNOSIS — L03314 Cellulitis of groin: Secondary | ICD-10-CM | POA: Diagnosis present

## 2021-04-08 DIAGNOSIS — D72829 Elevated white blood cell count, unspecified: Secondary | ICD-10-CM | POA: Diagnosis present

## 2021-04-08 DIAGNOSIS — E1122 Type 2 diabetes mellitus with diabetic chronic kidney disease: Secondary | ICD-10-CM | POA: Diagnosis present

## 2021-04-08 DIAGNOSIS — M810 Age-related osteoporosis without current pathological fracture: Secondary | ICD-10-CM | POA: Diagnosis present

## 2021-04-08 DIAGNOSIS — L03818 Cellulitis of other sites: Secondary | ICD-10-CM | POA: Diagnosis present

## 2021-04-08 DIAGNOSIS — Z90710 Acquired absence of both cervix and uterus: Secondary | ICD-10-CM

## 2021-04-08 DIAGNOSIS — E785 Hyperlipidemia, unspecified: Secondary | ICD-10-CM | POA: Diagnosis present

## 2021-04-08 DIAGNOSIS — E1140 Type 2 diabetes mellitus with diabetic neuropathy, unspecified: Secondary | ICD-10-CM | POA: Diagnosis present

## 2021-04-08 DIAGNOSIS — Z79899 Other long term (current) drug therapy: Secondary | ICD-10-CM

## 2021-04-08 DIAGNOSIS — M898X9 Other specified disorders of bone, unspecified site: Secondary | ICD-10-CM | POA: Diagnosis present

## 2021-04-08 DIAGNOSIS — E119 Type 2 diabetes mellitus without complications: Secondary | ICD-10-CM

## 2021-04-08 DIAGNOSIS — N762 Acute vulvitis: Secondary | ICD-10-CM | POA: Diagnosis present

## 2021-04-08 DIAGNOSIS — Z20822 Contact with and (suspected) exposure to covid-19: Secondary | ICD-10-CM | POA: Diagnosis present

## 2021-04-08 DIAGNOSIS — Z992 Dependence on renal dialysis: Secondary | ICD-10-CM

## 2021-04-08 DIAGNOSIS — N764 Abscess of vulva: Secondary | ICD-10-CM | POA: Diagnosis not present

## 2021-04-08 DIAGNOSIS — K5900 Constipation, unspecified: Secondary | ICD-10-CM | POA: Diagnosis not present

## 2021-04-08 DIAGNOSIS — L0390 Cellulitis, unspecified: Secondary | ICD-10-CM

## 2021-04-08 DIAGNOSIS — Z8249 Family history of ischemic heart disease and other diseases of the circulatory system: Secondary | ICD-10-CM

## 2021-04-08 DIAGNOSIS — Z833 Family history of diabetes mellitus: Secondary | ICD-10-CM

## 2021-04-08 DIAGNOSIS — I120 Hypertensive chronic kidney disease with stage 5 chronic kidney disease or end stage renal disease: Secondary | ICD-10-CM | POA: Diagnosis present

## 2021-04-08 DIAGNOSIS — D649 Anemia, unspecified: Secondary | ICD-10-CM | POA: Diagnosis present

## 2021-04-08 DIAGNOSIS — Z9851 Tubal ligation status: Secondary | ICD-10-CM

## 2021-04-08 DIAGNOSIS — N186 End stage renal disease: Secondary | ICD-10-CM | POA: Diagnosis present

## 2021-04-08 DIAGNOSIS — Z881 Allergy status to other antibiotic agents status: Secondary | ICD-10-CM

## 2021-04-08 DIAGNOSIS — N761 Subacute and chronic vaginitis: Secondary | ICD-10-CM | POA: Diagnosis present

## 2021-04-08 DIAGNOSIS — H543 Unqualified visual loss, both eyes: Secondary | ICD-10-CM | POA: Diagnosis present

## 2021-04-08 DIAGNOSIS — Z888 Allergy status to other drugs, medicaments and biological substances status: Secondary | ICD-10-CM

## 2021-04-08 DIAGNOSIS — L03319 Cellulitis of trunk, unspecified: Secondary | ICD-10-CM | POA: Diagnosis present

## 2021-04-08 DIAGNOSIS — K219 Gastro-esophageal reflux disease without esophagitis: Secondary | ICD-10-CM | POA: Diagnosis present

## 2021-04-08 DIAGNOSIS — Z880 Allergy status to penicillin: Secondary | ICD-10-CM

## 2021-04-08 DIAGNOSIS — B964 Proteus (mirabilis) (morganii) as the cause of diseases classified elsewhere: Secondary | ICD-10-CM | POA: Diagnosis present

## 2021-04-08 HISTORY — DX: End stage renal disease: N18.6

## 2021-04-08 LAB — CBC WITH DIFFERENTIAL/PLATELET
Abs Immature Granulocytes: 0.2 10*3/uL — ABNORMAL HIGH (ref 0.00–0.07)
Basophils Absolute: 0 10*3/uL (ref 0.0–0.1)
Basophils Relative: 0 %
Eosinophils Absolute: 0.2 10*3/uL (ref 0.0–0.5)
Eosinophils Relative: 1 %
HCT: 40.1 % (ref 36.0–46.0)
Hemoglobin: 12.8 g/dL (ref 12.0–15.0)
Lymphocytes Relative: 30 %
Lymphs Abs: 7.4 10*3/uL — ABNORMAL HIGH (ref 0.7–4.0)
MCH: 26.9 pg (ref 26.0–34.0)
MCHC: 31.9 g/dL (ref 30.0–36.0)
MCV: 84.2 fL (ref 80.0–100.0)
Metamyelocytes Relative: 1 %
Monocytes Absolute: 1.2 10*3/uL — ABNORMAL HIGH (ref 0.1–1.0)
Monocytes Relative: 5 %
Neutro Abs: 15.4 10*3/uL — ABNORMAL HIGH (ref 1.7–7.7)
Neutrophils Relative %: 63 %
Platelets: 381 10*3/uL (ref 150–400)
RBC: 4.76 MIL/uL (ref 3.87–5.11)
RDW: 17.4 % — ABNORMAL HIGH (ref 11.5–15.5)
WBC: 24.5 10*3/uL — ABNORMAL HIGH (ref 4.0–10.5)
nRBC: 0 % (ref 0.0–0.2)
nRBC: 0 /100 WBC

## 2021-04-08 LAB — LACTIC ACID, PLASMA: Lactic Acid, Venous: 1.3 mmol/L (ref 0.5–1.9)

## 2021-04-08 MED ORDER — MORPHINE SULFATE (PF) 4 MG/ML IV SOLN
4.0000 mg | Freq: Once | INTRAVENOUS | Status: AC
Start: 2021-04-08 — End: 2021-04-08
  Administered 2021-04-08: 4 mg via INTRAVENOUS
  Filled 2021-04-08: qty 1

## 2021-04-08 MED ORDER — ACETAMINOPHEN 325 MG PO TABS: 650.0000 mg | ORAL_TABLET | Freq: Once | ORAL | Status: DC

## 2021-04-08 MED ORDER — FENTANYL CITRATE PF 50 MCG/ML IJ SOSY
75.0000 ug | PREFILLED_SYRINGE | INTRAMUSCULAR | Status: DC | PRN
  Administered 2021-04-09: 75 ug via INTRAVENOUS
  Filled 2021-04-08 (×2): qty 2

## 2021-04-08 MED ORDER — ONDANSETRON HCL 4 MG/2ML IJ SOLN
4.0000 mg | Freq: Once | INTRAMUSCULAR | Status: AC
  Administered 2021-04-09: 4 mg via INTRAVENOUS
  Filled 2021-04-08: qty 2

## 2021-04-08 NOTE — ED Provider Notes (Signed)
Emergency Medicine Provider Triage Evaluation Note  [name] [name] , a 55 y.o. female  was evaluated in triage.  Pt complains of boils, pain and swelling to her labia.  She denies fevers.  She had her full dialysis treatment today with out complications.    Review of Systems  Positive: Vulvar pain Negative: fevers  Physical Exam  There were no vitals taken for this visit. Gen:   Awake, no distress   Resp:  Normal effort  MSK:   Moves extremities without difficulty  Other:  Awake and alert.   Medical Decision Making  Medically screening exam initiated at 5:21 PM.  Appropriate orders placed.  DNAYA RAYNE was informed that the remainder of the evaluation will be completed by another provider, this initial triage assessment does not replace that evaluation, and the importance of remaining in the ED until their evaluation is complete.  GU exam deferred due to triage setting   Ollen Gross 04/08/21 1724    Valarie Merino, MD 04/08/21 [phone]

## 2021-04-08 NOTE — ED Notes (Signed)
Pt is hard stick - MD and PA notified - IV team consult put in

## 2021-04-08 NOTE — ED Triage Notes (Signed)
Pt BIB [facility] from dialysis, pt c/o "boils" to her genital area. Seen by her PCP and prescribed abx, no relief. Full dialysis treatment done today.

## 2021-04-08 NOTE — ED Notes (Signed)
Pt to ct 

## 2021-04-08 NOTE — ED Notes (Signed)
Please tell the patient to call her husband

## 2021-04-08 NOTE — ED Provider Notes (Signed)
[facility] EMERGENCY DEPARTMENT Provider Note   CSN: [id number] Arrival date & time: 04/08/21  1719     History Chief Complaint  Patient presents with   Abscess    [name] [name] is a 55 y.o. female.  HPI Patient is 55 year old dialysis patient states that she has been on dialysis for 3 years, hypertension on Coreg (although it seems perhaps DC-ed) and amlodipine states that she took her blood pressure medications today but cannot tell me which medication she took/which when she is prescribed.  She is here in the ER today with continued right labial pain and states it is severe constant worse with touch and movement.  She states that she is at baseline not walking.  She also has impaired vision in both eyes, history of syncope  She states that for the past 2 weeks she has had constant worsening itching burning and discomfort in her suprapubic and vaginal area.  She denies any vaginal discharge.  Does not make urine anymore therefore is uncertain about any urinary frequency urgency or hematuria  States that she was started on antibiotic but does not recall the name she states that she was started on this Monday by PCP.  She denies any fevers or chills but was unable to observe 100.2 here in the ER. She states she feels more fatigued, and is primarily complaining of severe pain in her vaginal area primarily the right labia and suprapubic region.     Past Medical History:  Diagnosis Date   Colitis    Diabetes mellitus    Family history of adverse reaction to anesthesia    " my uncle's heart stoped "   Fracture of femoral neck, right ([place]) 10/02/2020   GERD (gastroesophageal reflux disease)    Heart murmur    Hypertension    Impaired vision in both eyes 11/19/2015   Mitral regurgitation    Neuropathy in diabetes ([place])    Syncope and collapse 07/28/2015    Patient Active Problem List   Diagnosis Date Noted   Osteoporosis 10/13/2020   Fever    Closed  fracture of left proximal tibia 10/10/2020   Fracture of femoral neck, right ([place]) 10/02/2020   Pressure injury of skin 03/12/2020   Sepsis ([place]) 03/07/2020   ESRD (end stage renal disease) ([place]) 03/07/2020   Hyperkalemia 03/07/2020   Acute cystitis [id number]   Acute metabolic encephalopathy [id number]   Impaired vision in both eyes 11/19/2015   Diabetes mellitus with complication (HCC)    Intractable headache    Syncope 07/28/2015   Chronic headaches 07/28/2015   Chest pain 05/22/2015   Urinary frequency 03/13/2015   Easy bruising 10/09/2014   Encounter for preventative adult health care exam with abnormal findings 01/11/2014   Chronic constipation 01/11/2014   GERD (gastroesophageal reflux disease) 0000000   Helicobacter positive gastritis 12/06/2013   Dizziness and giddiness 10/12/2013   Essential hypertension 09/19/2013   Back pain 06/11/2013   Lumbosacral spondylosis without myelopathy 02/20/2013   Diabetic neuropathy ([place]) 01/09/2013   DM (diabetes mellitus), type 2, uncontrolled 10/02/2012   MITRAL REGURGITATION, mild 10/31/2009   PLEURAL EFFUSION, RIGHT after pyelonephritis in April 2011 (no intervention) 10/20/2009    Past Surgical History:  Procedure Laterality Date   ABDOMINAL HYSTERECTOMY     CATARACT EXTRACTION     CESAREAN SECTION     CHOLECYSTECTOMY     EYE SURGERY Left retinal   GANGLION CYST EXCISION     HIP PINNING,CANNULATED Right 10/02/2020  Procedure: CANNULATED HIP PINNING;  Surgeon: Altamese [place], MD;  Location: [place];  Service: Orthopedics;  Laterality: Right;   IR FLUORO GUIDE CV LINE LEFT  10/13/2020   IR PTA VENOUS EXCEPT DIALYSIS CIRCUIT  10/13/2020   ORIF TIBIA PLATEAU Left 10/09/2020   Procedure: OPEN REDUCTION INTERNAL FIXATION (ORIF) TIBIAL PLATEAU;  Surgeon: Altamese [place]bern, MD;  Location: [place];  Service: Orthopedics;  Laterality: Left;   TONSILLECTOMY     TUBAL LIGATION       OB History   No obstetric history on file.     Family  History  Problem Relation Age of Onset   Diabetes Father    Heart attack Father    Aneurysm Mother     Social History   Tobacco Use   Smoking status: Never   Smokeless tobacco: Never  Substance Use Topics   Alcohol use: No    Alcohol/week: 0.0 standard drinks   Drug use: No    Home Medications Prior to Admission medications   Medication Sig Start Date End Date Taking? Authorizing Provider  BISACODYL PO Take 5 mg by mouth daily as needed (constipation).   Yes [provider]  gabapentin (NEURONTIN) 100 MG capsule Take 100 mg by mouth daily as needed (pain).   Yes [provider]  lidocaine (LIDODERM) 5 % Place 1 patch onto the skin daily. Remove & Discard patch within 12 hours or as directed by MD 10/16/20  Yes Alexandria Lodge, MD  gabapentin (NEURONTIN) 100 MG capsule Take 200 mg by mouth at bedtime as needed (pain). Patient not taking: Reported on 04/08/2021    [provider]  methocarbamol (ROBAXIN) 500 MG tablet TAKE 1 TABLET (500 MG TOTAL) BY MOUTH EVERY SIX HOURS AS NEEDED FOR MUSCLE SPASMS AND HEADACHE. Patient not taking: No sig reported 06/03/20 06/03/21  [place] Eric J, DO  senna-docusate (SENOKOT-S) 8.6-50 MG tablet Take 2 tablets by mouth 2 (two) times daily. Patient not taking: No sig reported 10/16/20   Alexandria Lodge, MD  SUMAtriptan (IMITREX) 100 MG tablet TAKE 1 TABLET (100 MG TOTAL) BY MOUTH ONCE AS NEEDED FOR MIGRAINE. MAY REPEAT IN 2 HOURS IF HEADACHE PERSISTS OR RECURS. Patient not taking: No sig reported 06/03/20 06/03/21  [place] Eric J, DO    Allergies    Amitriptyline, Ciprofloxacin, Semaglutide, Tramadol, and Tylenol [acetaminophen]  Review of Systems   Review of Systems  Constitutional:  Positive for fatigue. Negative for chills and fever.  HENT:  Negative for congestion.   Eyes:  Negative for pain.  Respiratory:  Negative for cough and shortness of breath.   Cardiovascular:  Negative for chest pain and leg swelling.   Gastrointestinal:  Negative for abdominal pain, diarrhea, nausea and vomiting.  Genitourinary:  Negative for dysuria.  Musculoskeletal:  Negative for myalgias.  Skin:  Negative for rash.       Swelling and pain of the vaginal area specifically right labia and suprapubic region  Neurological:  Negative for dizziness and headaches.   Physical Exam Updated Vital Signs BP (!) 212/88   Pulse 74   Temp 100.1 F (37.8 C) (Axillary)   Resp 16   SpO2 97%   Physical Exam Vitals and nursing note reviewed.  Constitutional:      General: She is not in acute distress.    Comments: Chronically ill-appearing 55 year old female.  HENT:     Head: Normocephalic and atraumatic.     Nose: Nose normal.     Mouth/Throat:     Mouth:  Mucous membranes are moist.  Eyes:     General: No scleral icterus. Cardiovascular:     Rate and Rhythm: Normal rate and regular rhythm.     Pulses: Normal pulses.     Heart sounds: Normal heart sounds.  Pulmonary:     Effort: Pulmonary effort is normal. No respiratory distress.     Breath sounds: No wheezing.  Abdominal:     Palpations: Abdomen is soft.     Tenderness: There is no abdominal tenderness. There is no guarding or rebound.  Genitourinary:    Comments: Swollen suprapubic area with cellulitic swelling suprapubic tracking down into the labia bilaterally primarily seems to be more swelling in the right labia majora. Musculoskeletal:     Cervical back: Normal range of motion.     Right lower leg: No edema.     Left lower leg: No edema.  Skin:    General: Skin is warm and dry.     Capillary Refill: Capillary refill takes less than 2 seconds.  Neurological:     Mental Status: She is alert. Mental status is at baseline.  Psychiatric:        Mood and Affect: Mood normal.        Behavior: Behavior normal.    ED Results / Procedures / Treatments   Labs (all labs ordered are listed, but only abnormal results are displayed) Labs Reviewed  CBC WITH  DIFFERENTIAL/PLATELET - Abnormal; Notable for the following components:      Result Value   WBC 24.5 (*)    RDW 17.4 (*)    Neutro Abs 15.4 (*)    Lymphs Abs 7.4 (*)    Monocytes Absolute 1.2 (*)    Abs Immature Granulocytes 0.20 (*)    All other components within normal limits  CULTURE, BLOOD (ROUTINE X 2)  CULTURE, BLOOD (ROUTINE X 2)  RESP PANEL BY RT-PCR (FLU A&B, COVID) ARPGX2  URINE CULTURE  LACTIC ACID, PLASMA  LACTIC ACID, PLASMA  COMPREHENSIVE METABOLIC PANEL  URINALYSIS, ROUTINE W REFLEX MICROSCOPIC    EKG None  Radiology CT ABDOMEN PELVIS WO CONTRAST  Result Date: 04/08/2021 CLINICAL DATA:  Abdominal pain, fever pubic swelling and labial swelling concerning for abscess/infection - dialyzed today will start with non-con EXAM: CT ABDOMEN AND PELVIS WITHOUT CONTRAST TECHNIQUE: Multidetector CT imaging of the abdomen and pelvis was performed following the standard protocol without IV contrast. COMPARISON:  None. FINDINGS: Lower chest: No acute abnormality. Linear atelectasis versus scarring. Hepatobiliary: No focal liver abnormality. Status post cholecystectomy. No biliary dilatation. Pancreas: No focal lesion. Normal pancreatic contour. No surrounding inflammatory changes. No main pancreatic ductal dilatation. Spleen: Normal in size without focal abnormality. Adrenals/Urinary Tract: No adrenal nodule bilaterally. No nephrolithiasis and no hydronephrosis. No definite contour-deforming renal mass. No ureterolithiasis or hydroureter. Marked urinary bladder wall thickening with associated perivesicular fat stranding. Stomach/Bowel: Stomach is within normal limits. No evidence of bowel wall thickening or dilatation. Stool throughout the colon. Appendix appears normal. Vascular/Lymphatic: No abdominal aorta or iliac aneurysm. At least moderate atherosclerotic plaque of the aorta and its branches. Prominent but nonenlarged pelvic lymph nodes. No abdominal, pelvic, or inguinal  lymphadenopathy. Reproductive: Status post hysterectomy. No adnexal masses. Other: No intraperitoneal free fluid. No intraperitoneal free gas. No organized fluid collection. Musculoskeletal: Tiny fat containing umbilical hernia. Mild subcutaneus soft tissue edema. Subcutaneus soft tissue fat stranding within the right labia majora with query of an approximately 2 cm underlying organized fluid collection versus phlegmon formation (6:37, 3:85, 7:63). No suspicious  lytic or blastic osseous lesions. No acute displaced fracture. Surgical hardware within the right proximal femur. IMPRESSION: 1. Urinary bladder findings suggestive of infection. Correlate with urinalysis. 2. Subcutaneus soft tissue fat stranding within the right labia majora with query of an approximately 2 cm underlying organized fluid collection versus phlegmon formation. Markedly limited evaluation on this noncontrast study. 3. Constipation. Electronically Signed   By: Iven Finn M.D.   On: 04/08/2021 23:50    Procedures Procedures   Medications Ordered in ED Medications  fentaNYL (SUBLIMAZE) injection 75 mcg (has no administration in time range)  ondansetron (ZOFRAN) injection 4 mg (has no administration in time range)  cefTRIAXone (ROCEPHIN) 1 g in sodium chloride 0.9 % 100 mL IVPB (has no administration in time range)  hydrALAZINE (APRESOLINE) tablet 100 mg (has no administration in time range)  morphine 4 MG/ML injection 4 mg (4 mg Intravenous Given 04/08/21 2246)    ED Course  I have reviewed the triage vital signs and the nursing notes.  Pertinent labs & imaging results that were available during my care of the patient were reviewed by me and considered in my medical decision making (see chart for details).    MDM Rules/Calculators/A&P                          Patient is a 55 year old female with past medical history of ESRD on dialysis has been on this for 3 years  She is presented to ER today with 2 weeks of labial  pain and swelling redness she has leukocytosis on physical exam she has severe redness swelling tenderness to suprapubic region and right labia with some swelling of the left as well.  Bedside ultrasound did not show any distinct obvious drainable abscess.  CT abdomen pelvis without contrast obtained after discussion with Dr. Matilde Sprang was relatively inclusive does show significant stranding consistent with my evaluation most likely cellulitic.  Lactic within normal limits objectively patient does not have a fever and she is not tachycardic does not meet sepsis criteria.  Is hypertensive seems to be a chronic issue for her she has not taken her 100 mg of hydralazine evening dose.  Blood cultures obtained and pending.  CMP hemolyzed will be recollected.  Discussed with Dr. Velia Meyer of hospitalist who will accept patient once work-up in the ER is done. Oncoming team made aware of patient. Samantha P will followup on labs and message Dr. Velia Meyer after resulted.   Final Clinical Impression(s) / ED Diagnoses Final diagnoses:  Cellulitis of pubic region  Cellulitis, unspecified cellulitis site    Rx / DC Orders ED Discharge Orders     None        Tedd Sias, [place] 04/09/21 0032    Teressa Lower, MD 04/09/21 1950

## 2021-04-09 ENCOUNTER — Encounter (HOSPITAL_COMMUNITY): Payer: Self-pay | Admitting: Internal Medicine

## 2021-04-09 DIAGNOSIS — Z992 Dependence on renal dialysis: Secondary | ICD-10-CM | POA: Diagnosis not present

## 2021-04-09 DIAGNOSIS — N764 Abscess of vulva: Secondary | ICD-10-CM | POA: Diagnosis present

## 2021-04-09 DIAGNOSIS — D72829 Elevated white blood cell count, unspecified: Secondary | ICD-10-CM | POA: Diagnosis not present

## 2021-04-09 DIAGNOSIS — E1140 Type 2 diabetes mellitus with diabetic neuropathy, unspecified: Secondary | ICD-10-CM | POA: Diagnosis present

## 2021-04-09 DIAGNOSIS — L0390 Cellulitis, unspecified: Secondary | ICD-10-CM | POA: Diagnosis present

## 2021-04-09 DIAGNOSIS — E785 Hyperlipidemia, unspecified: Secondary | ICD-10-CM | POA: Diagnosis present

## 2021-04-09 DIAGNOSIS — Z881 Allergy status to other antibiotic agents status: Secondary | ICD-10-CM | POA: Diagnosis not present

## 2021-04-09 DIAGNOSIS — K219 Gastro-esophageal reflux disease without esophagitis: Secondary | ICD-10-CM | POA: Diagnosis present

## 2021-04-09 DIAGNOSIS — Z20822 Contact with and (suspected) exposure to covid-19: Secondary | ICD-10-CM | POA: Diagnosis present

## 2021-04-09 DIAGNOSIS — L03319 Cellulitis of trunk, unspecified: Secondary | ICD-10-CM

## 2021-04-09 DIAGNOSIS — E119 Type 2 diabetes mellitus without complications: Secondary | ICD-10-CM

## 2021-04-09 DIAGNOSIS — D649 Anemia, unspecified: Secondary | ICD-10-CM | POA: Diagnosis present

## 2021-04-09 DIAGNOSIS — N761 Subacute and chronic vaginitis: Secondary | ICD-10-CM | POA: Diagnosis present

## 2021-04-09 DIAGNOSIS — E1122 Type 2 diabetes mellitus with diabetic chronic kidney disease: Secondary | ICD-10-CM

## 2021-04-09 DIAGNOSIS — I340 Nonrheumatic mitral (valve) insufficiency: Secondary | ICD-10-CM | POA: Diagnosis present

## 2021-04-09 DIAGNOSIS — Z90710 Acquired absence of both cervix and uterus: Secondary | ICD-10-CM | POA: Diagnosis not present

## 2021-04-09 DIAGNOSIS — L03314 Cellulitis of groin: Secondary | ICD-10-CM | POA: Diagnosis present

## 2021-04-09 DIAGNOSIS — L03818 Cellulitis of other sites: Secondary | ICD-10-CM | POA: Diagnosis present

## 2021-04-09 DIAGNOSIS — N186 End stage renal disease: Secondary | ICD-10-CM | POA: Diagnosis present

## 2021-04-09 DIAGNOSIS — Z79899 Other long term (current) drug therapy: Secondary | ICD-10-CM | POA: Diagnosis not present

## 2021-04-09 DIAGNOSIS — Z9851 Tubal ligation status: Secondary | ICD-10-CM | POA: Diagnosis not present

## 2021-04-09 DIAGNOSIS — M810 Age-related osteoporosis without current pathological fracture: Secondary | ICD-10-CM | POA: Diagnosis present

## 2021-04-09 DIAGNOSIS — K5900 Constipation, unspecified: Secondary | ICD-10-CM | POA: Diagnosis not present

## 2021-04-09 DIAGNOSIS — H543 Unqualified visual loss, both eyes: Secondary | ICD-10-CM | POA: Diagnosis present

## 2021-04-09 DIAGNOSIS — I120 Hypertensive chronic kidney disease with stage 5 chronic kidney disease or end stage renal disease: Secondary | ICD-10-CM | POA: Diagnosis present

## 2021-04-09 DIAGNOSIS — N762 Acute vulvitis: Secondary | ICD-10-CM | POA: Diagnosis present

## 2021-04-09 DIAGNOSIS — B964 Proteus (mirabilis) (morganii) as the cause of diseases classified elsewhere: Secondary | ICD-10-CM | POA: Diagnosis present

## 2021-04-09 DIAGNOSIS — M898X9 Other specified disorders of bone, unspecified site: Secondary | ICD-10-CM | POA: Diagnosis present

## 2021-04-09 LAB — CBC WITH DIFFERENTIAL/PLATELET
Abs Immature Granulocytes: 0 10*3/uL (ref 0.00–0.07)
Basophils Absolute: 0.2 10*3/uL — ABNORMAL HIGH (ref 0.0–0.1)
Basophils Relative: 1 %
Eosinophils Absolute: 0 10*3/uL (ref 0.0–0.5)
Eosinophils Relative: 0 %
HCT: 41.7 % (ref 36.0–46.0)
Hemoglobin: 12.4 g/dL (ref 12.0–15.0)
Lymphocytes Relative: 27 %
Lymphs Abs: 6.7 10*3/uL — ABNORMAL HIGH (ref 0.7–4.0)
MCH: 26.4 pg (ref 26.0–34.0)
MCHC: 29.7 g/dL — ABNORMAL LOW (ref 30.0–36.0)
MCV: 88.9 fL (ref 80.0–100.0)
Monocytes Absolute: 1.2 10*3/uL — ABNORMAL HIGH (ref 0.1–1.0)
Monocytes Relative: 5 %
Neutro Abs: 16.6 10*3/uL — ABNORMAL HIGH (ref 1.7–7.7)
Neutrophils Relative %: 67 %
Platelets: 300 10*3/uL (ref 150–400)
RBC: 4.69 MIL/uL (ref 3.87–5.11)
RDW: 17.4 % — ABNORMAL HIGH (ref 11.5–15.5)
WBC: 24.8 10*3/uL — ABNORMAL HIGH (ref 4.0–10.5)
nRBC: 0 % (ref 0.0–0.2)
nRBC: 0 /100 WBC

## 2021-04-09 LAB — COMPREHENSIVE METABOLIC PANEL
ALT: 9 U/L (ref 0–44)
ALT: 9 U/L (ref 0–44)
AST: 13 U/L — ABNORMAL LOW (ref 15–41)
AST: 12 U/L — ABNORMAL LOW (ref 15–41)
Albumin: 2.8 g/dL — ABNORMAL LOW (ref 3.5–5.0)
Albumin: 2.8 g/dL — ABNORMAL LOW (ref 3.5–5.0)
Alkaline Phosphatase: 151 U/L — ABNORMAL HIGH (ref 38–126)
Alkaline Phosphatase: 126 U/L (ref 38–126)
Anion gap: 13 (ref 5–15)
Anion gap: 14 (ref 5–15)
BUN: 16 mg/dL (ref 6–20)
BUN: 15 mg/dL (ref 6–20)
CO2: 25 mmol/L (ref 22–32)
CO2: 26 mmol/L (ref 22–32)
Calcium: 8.7 mg/dL — ABNORMAL LOW (ref 8.9–10.3)
Calcium: 8.8 mg/dL — ABNORMAL LOW (ref 8.9–10.3)
Chloride: 100 mmol/L (ref 98–111)
Chloride: 99 mmol/L (ref 98–111)
Creatinine, Ser: 3.54 mg/dL — ABNORMAL HIGH (ref 0.44–1.00)
Creatinine, Ser: 3.59 mg/dL — ABNORMAL HIGH (ref 0.44–1.00)
GFR, Estimated: 15 mL/min — ABNORMAL LOW (ref >60)
GFR, Estimated: 14 mL/min — ABNORMAL LOW (ref >60)
Glucose, Bld: 86 mg/dL (ref 70–99)
Glucose, Bld: 95 mg/dL (ref 70–99)
Potassium: 3.9 mmol/L (ref 3.5–5.1)
Potassium: 3.7 mmol/L (ref 3.5–5.1)
Sodium: 138 mmol/L (ref 135–145)
Sodium: 139 mmol/L (ref 135–145)
Total Bilirubin: 0.6 mg/dL (ref 0.3–1.2)
Total Bilirubin: 0.6 mg/dL (ref 0.3–1.2)
Total Protein: 7.1 g/dL (ref 6.5–8.1)
Total Protein: 7.2 g/dL (ref 6.5–8.1)

## 2021-04-09 LAB — MAGNESIUM: Magnesium: 1.9 mg/dL (ref 1.7–2.4)

## 2021-04-09 LAB — PHOSPHORUS: Phosphorus: 3.5 mg/dL (ref 2.5–4.6)

## 2021-04-09 LAB — RESP PANEL BY RT-PCR (FLU A&B, COVID) ARPGX2
Influenza A by PCR: NEGATIVE
Influenza B by PCR: NEGATIVE
SARS Coronavirus 2 by RT PCR: NEGATIVE

## 2021-04-09 LAB — CBG MONITORING, ED
Glucose-Capillary: 122 mg/dL — ABNORMAL HIGH (ref 70–99)
Glucose-Capillary: 144 mg/dL — ABNORMAL HIGH (ref 70–99)

## 2021-04-09 LAB — GLUCOSE, CAPILLARY: Glucose-Capillary: 111 mg/dL — ABNORMAL HIGH (ref 70–99)

## 2021-04-09 LAB — HIV ANTIBODY (ROUTINE TESTING W REFLEX): HIV Screen 4th Generation wRfx: NONREACTIVE

## 2021-04-09 MED ORDER — IBUPROFEN 200 MG PO TABS
400.0000 mg | ORAL_TABLET | Freq: Four times a day (QID) | ORAL | Status: DC | PRN
  Administered 2021-04-09: 400 mg via ORAL
  Filled 2021-04-09: qty 1

## 2021-04-09 MED ORDER — ACETAMINOPHEN 650 MG RE SUPP: 650.0000 mg | Freq: Four times a day (QID) | RECTAL | Status: DC | PRN

## 2021-04-09 MED ORDER — ONDANSETRON HCL 4 MG/2ML IJ SOLN
4.0000 mg | Freq: Four times a day (QID) | INTRAMUSCULAR | Status: DC | PRN
  Administered 2021-04-09 (×3): 4 mg via INTRAVENOUS
  Filled 2021-04-09 (×3): qty 2

## 2021-04-09 MED ORDER — METRONIDAZOLE 500 MG/100ML IV SOLN
500.0000 mg | Freq: Three times a day (TID) | INTRAVENOUS | Status: DC
  Administered 2021-04-09: 500 mg via INTRAVENOUS
  Filled 2021-04-09: qty 100

## 2021-04-09 MED ORDER — CARVEDILOL 3.125 MG PO TABS
3.1250 mg | ORAL_TABLET | Freq: Two times a day (BID) | ORAL | Status: DC
  Administered 2021-04-10 – 2021-04-11 (×4): 3.125 mg via ORAL
  Filled 2021-04-09 (×5): qty 1

## 2021-04-09 MED ORDER — NALOXONE HCL 0.4 MG/ML IJ SOLN
0.4000 mg | INTRAMUSCULAR | Status: DC | PRN
Start: 2021-04-09 — End: 2021-04-14

## 2021-04-09 MED ORDER — HYDRALAZINE HCL 20 MG/ML IJ SOLN
10.0000 mg | Freq: Once | INTRAMUSCULAR | Status: AC
  Administered 2021-04-09: 10 mg via INTRAVENOUS
  Filled 2021-04-09: qty 1

## 2021-04-09 MED ORDER — PIPERACILLIN-TAZOBACTAM IN DEX 2-0.25 GM/50ML IV SOLN
2.2500 g | Freq: Three times a day (TID) | INTRAVENOUS | Status: DC
  Administered 2021-04-09 – 2021-04-13 (×11): 2.25 g via INTRAVENOUS
  Filled 2021-04-09 (×14): qty 50

## 2021-04-09 MED ORDER — VANCOMYCIN HCL IN DEXTROSE 750-5 MG/150ML-% IV SOLN
750.0000 mg | INTRAVENOUS | Status: DC
  Administered 2021-04-10: 750 mg via INTRAVENOUS
  Filled 2021-04-09 (×3): qty 150

## 2021-04-09 MED ORDER — SODIUM CHLORIDE 0.9 % IV SOLN
12.5000 mg | Freq: Four times a day (QID) | INTRAVENOUS | Status: DC | PRN
  Administered 2021-04-09: 12.5 mg via INTRAVENOUS
  Filled 2021-04-09 (×2): qty 0.5

## 2021-04-09 MED ORDER — SODIUM CHLORIDE 0.9 % IV SOLN
1.0000 g | Freq: Once | INTRAVENOUS | Status: AC
  Administered 2021-04-09: 1 g via INTRAVENOUS
  Filled 2021-04-09: qty 10

## 2021-04-09 MED ORDER — HYDRALAZINE HCL 10 MG PO TABS: 10.0000 mg | ORAL_TABLET | Freq: Once | ORAL | Status: DC

## 2021-04-09 MED ORDER — PROSOURCE PLUS PO LIQD
30.0000 mL | Freq: Two times a day (BID) | ORAL | Status: DC
  Administered 2021-04-09 – 2021-04-11 (×3): 30 mL via ORAL
  Filled 2021-04-09 (×7): qty 30

## 2021-04-09 MED ORDER — ACETAMINOPHEN 325 MG PO TABS: 650.0000 mg | ORAL_TABLET | Freq: Four times a day (QID) | ORAL | Status: DC | PRN

## 2021-04-09 MED ORDER — SODIUM CHLORIDE 0.9 % IV SOLN: 1.0000 g | INTRAVENOUS | Status: DC

## 2021-04-09 MED ORDER — HYDRALAZINE HCL 25 MG PO TABS
100.0000 mg | ORAL_TABLET | Freq: Once | ORAL | Status: AC
  Administered 2021-04-09: 100 mg via ORAL
  Filled 2021-04-09: qty 4

## 2021-04-09 MED ORDER — AMLODIPINE BESYLATE 10 MG PO TABS
10.0000 mg | ORAL_TABLET | Freq: Every day | ORAL | Status: DC
  Administered 2021-04-11 – 2021-04-14 (×4): 10 mg via ORAL
  Filled 2021-04-09 (×5): qty 1

## 2021-04-09 MED ORDER — MORPHINE SULFATE (PF) 2 MG/ML IV SOLN
1.0000 mg | INTRAVENOUS | Status: DC | PRN
  Administered 2021-04-09: 4 mg via INTRAVENOUS
  Administered 2021-04-09 – 2021-04-10 (×4): 2 mg via INTRAVENOUS
  Filled 2021-04-09: qty 2
  Filled 2021-04-09 (×5): qty 1

## 2021-04-09 MED ORDER — INSULIN ASPART 100 UNIT/ML IJ SOLN
0.0000 [IU] | Freq: Three times a day (TID) | INTRAMUSCULAR | Status: DC
  Administered 2021-04-11: 5 [IU] via SUBCUTANEOUS
  Administered 2021-04-11: 2 [IU] via SUBCUTANEOUS
  Administered 2021-04-11: 3 [IU] via SUBCUTANEOUS
  Administered 2021-04-12: 1 [IU] via SUBCUTANEOUS

## 2021-04-09 MED ORDER — OXYCODONE HCL 5 MG PO TABS
5.0000 mg | ORAL_TABLET | ORAL | Status: DC | PRN
Start: 2021-04-09 — End: 2021-04-14
  Administered 2021-04-09 – 2021-04-14 (×12): 5 mg via ORAL
  Filled 2021-04-09 (×12): qty 1

## 2021-04-09 MED ORDER — POLYETHYLENE GLYCOL 3350 17 G PO PACK
17.0000 g | PACK | Freq: Every day | ORAL | Status: DC
  Administered 2021-04-09 – 2021-04-11 (×2): 17 g via ORAL
  Filled 2021-04-09 (×4): qty 1

## 2021-04-09 MED ORDER — PROMETHAZINE HCL 25 MG PO TABS: 25.0000 mg | ORAL_TABLET | Freq: Four times a day (QID) | ORAL | Status: DC | PRN

## 2021-04-09 MED ORDER — VANCOMYCIN HCL 1250 MG/250ML IV SOLN
1250.0000 mg | Freq: Once | INTRAVENOUS | Status: AC
  Administered 2021-04-09: 1250 mg via INTRAVENOUS
  Filled 2021-04-09: qty 250

## 2021-04-09 MED ORDER — FENTANYL CITRATE PF 50 MCG/ML IJ SOSY
25.0000 ug | PREFILLED_SYRINGE | INTRAMUSCULAR | Status: AC | PRN
  Administered 2021-04-09: 25 ug via INTRAVENOUS
  Filled 2021-04-09: qty 1

## 2021-04-09 NOTE — ED Notes (Addendum)
Pt O2 sats dropped to 80s with a good pleth after giving fentanyl - RR dropped to 7/9 - pt states fentanyl helped her pain and pt is sleeping now - started on 3L - MD notified

## 2021-04-09 NOTE — Consult Note (Signed)
[name] [name] 1965-11-07  PI:[id number].    Requesting MD: Dr. Darliss Cheney Chief Complaint/Reason for Consult: right labial abscess  HPI:  This is a 55 yo black female with a history of DM, ESRD on HD, HTN, and prior pubic symphysis osteomyelitis and vulvar/perineal infection requiring multiple I&Ds at [facility].  All of her care is through [facility].  She has healed well from all of this according to her.    About 2 weeks ago she began to notice pain in her right labia again.  This has continued to progress.  Unclear why she hasn't sought treatment prior to now, so no abx therapies until now.  She denies any N/V, urinary issues, but some constipation.  She denies any fevers at home, but has had some chills, although she states she is always cold.  She is unsure when this started draining.  Due to persistent pain, she presented to the ED for evaluation.  She was noted to have a right labial abscess and request for bed at [facility] was made but denied apparently.  She has been admitted here and we have been asked to see her.  ROS: ROS: Please see HPI, otherwise all other systems have been reviewed and are negative.  She does not void much secondary to ESRD and her normal HD days are MWF.  Family History  Problem Relation Age of Onset   Diabetes Father    Heart attack Father    Aneurysm Mother     Past Medical History:  Diagnosis Date   Colitis    Diabetes mellitus    ESRD (end stage renal disease) ([place])    on HD (M,W,F)   Family history of adverse reaction to anesthesia    " my uncle's heart stoped "   Fracture of femoral neck, right ([place]) 10/02/2020   GERD (gastroesophageal reflux disease)    Heart murmur    Hypertension    Impaired vision in both eyes 11/19/2015   Mitral regurgitation    Neuropathy in diabetes ([place])    Syncope and collapse 07/28/2015    Past Surgical History:  Procedure Laterality Date   ABDOMINAL HYSTERECTOMY  1999   for fibroids. she thinks one ovary was  left   CATARACT EXTRACTION     CESAREAN SECTION     CHOLECYSTECTOMY     EYE SURGERY Left retinal   GANGLION CYST EXCISION     HIP PINNING,CANNULATED Right 10/02/2020   Procedure: CANNULATED HIP PINNING;  Surgeon: Altamese [place]ers [place], MD;  Location: [facility];  Service: Orthopedics;  Laterality: Right;   IR FLUORO GUIDE CV LINE LEFT  10/13/2020   IR PTA VENOUS EXCEPT DIALYSIS CIRCUIT  10/13/2020   ORIF TIBIA PLATEAU Left 10/09/2020   Procedure: OPEN REDUCTION INTERNAL FIXATION (ORIF) TIBIAL PLATEAU;  Surgeon: Altamese [place], MD;  Location: [facility];  Service: Orthopedics;  Laterality: Left;   TONSILLECTOMY     TUBAL LIGATION      Social History:  reports that she has never smoked. She has never used smokeless tobacco. She reports that she does not drink alcohol and does not use drugs.  Allergies:  Allergies  Allergen Reactions   Amitriptyline Swelling    Other reaction(s): Confusion (intolerance)   Ciprofloxacin Diarrhea and Nausea And Vomiting    VOMITING Severe vomiting requiring ED visit, IV reglan and IV fluids VOMITING    Semaglutide Nausea Only   Tramadol Itching   Tylenol [Acetaminophen] Itching    (Not in a hospital admission)  Physical Exam: Blood pressure (!) 205/79, pulse 71, temperature 100 F (37.8 C), temperature source Axillary, resp. rate 10, height 5' (1.524 m), weight 61.2 kg, SpO2 99 %. General: pleasant, WD, WN black female who is laying in bed in NAD HEENT: head is normocephalic, atraumatic.  Sclera are noninjected.  PERRL.  Ears and nose without any masses or lesions.  Mouth is pink and moist.  Face with some redness diffusely but more c/w dermatitis over infection. Heart: regular, rate, and rhythm.  Normal s1,s2. No obvious murmurs, gallops, or rubs noted.  Palpable radial and pedal pulses bilaterally Lungs: CTAB, no wheezes, rhonchi, or rales noted.  Respiratory effort nonlabored.  Left chest with tunneled HD cath in place Abd: soft, NT, ND, +BS, no masses,  hernias, or organomegaly GU: significant right labial erythema, pain, induration extending into her mons.  The labia feels fluctuant, but she will barely let me touch this so difficult to fully tell.  She has purulent drainage noted from right groin all the way down on the diaper she is wearing.  Unclear due to visibility issues exactly where this is coming from.  Non-tender on left side  MS: all 4 extremities are symmetrical with no cyanosis, clubbing, or edema.  BLE weakness and needs help moving her extremities Skin: warm and dry with no masses, lesions, or rashes Neuro: Cranial nerves 2-12 grossly intact, sensation is normal throughout Psych: A&Ox3 with an appropriate affect.   Results for orders placed or performed during the hospital encounter of 04/08/21 (from the past 48 hour(s))  CBC with Differential/Platelet     Status: Abnormal   Collection Time: 04/08/21 10:00 PM  Result Value Ref Range   WBC 24.5 (H) 4.0 - 10.5 K/uL   RBC 4.76 3.87 - 5.11 MIL/uL   Hemoglobin 12.8 12.0 - 15.0 g/dL   HCT 40.1 36.0 - 46.0 %   MCV 84.2 80.0 - 100.0 fL   MCH 26.9 26.0 - 34.0 pg   MCHC 31.9 30.0 - 36.0 g/dL   RDW 17.4 (H) 11.5 - 15.5 %   Platelets 381 150 - 400 K/uL   nRBC 0.0 0.0 - 0.2 %   Neutrophils Relative % 63 %   Neutro Abs 15.4 (H) 1.7 - 7.7 K/uL   Lymphocytes Relative 30 %   Lymphs Abs 7.4 (H) 0.7 - 4.0 K/uL   Monocytes Relative 5 %   Monocytes Absolute 1.2 (H) 0.1 - 1.0 K/uL   Eosinophils Relative 1 %   Eosinophils Absolute 0.2 0.0 - 0.5 K/uL   Basophils Relative 0 %   Basophils Absolute 0.0 0.0 - 0.1 K/uL   nRBC 0 0 /100 WBC   Metamyelocytes Relative 1 %   Abs Immature Granulocytes 0.20 (H) 0.00 - 0.07 K/uL    Comment: Performed at [facility], [address] [place], [place] [address]  Lactic acid, plasma     Status: None   Collection Time: 04/08/21 10:00 PM  Result Value Ref Range   Lactic Acid, Venous 1.3 0.5 - 1.9 mmol/L    Comment: Performed at [facility] [address] [place], [place] [address]  Blood culture (routine x 2)     Status: None (Preliminary result)   Collection Time: 04/08/21 10:40 PM   Specimen: BLOOD  Result Value Ref Range   Specimen Description BLOOD BLOOD LEFT FOREARM    Special Requests      BOTTLES DRAWN AEROBIC AND ANAEROBIC Blood Culture results may not be optimal  due to an inadequate volume of blood received in culture bottles   Culture      NO GROWTH < 12 HOURS Performed at [facility] [address] [place], [place] [address]    Report Status PENDING   Resp Panel by RT-PCR (Flu A&B, Covid) Nasopharyngeal Swab     Status: None   Collection Time: 04/09/21 12:08 AM   Specimen: Nasopharyngeal Swab; Nasopharyngeal(NP) swabs in vial transport medium  Result Value Ref Range   SARS Coronavirus 2 by RT PCR NEGATIVE NEGATIVE    Comment: (NOTE) SARS-CoV-2 target nucleic acids are NOT DETECTED.  The SARS-CoV-2 RNA is generally detectable in upper respiratory specimens during the acute phase of infection. The lowest concentration of SARS-CoV-2 viral copies this assay can detect is 138 copies/mL. A negative result does not preclude SARS-Cov-2 infection and should not be used as the sole basis for treatment or other patient management decisions. A negative result may occur with  improper specimen collection/handling, submission of specimen other than nasopharyngeal swab, presence of viral mutation(s) within the areas targeted by this assay, and inadequate number of viral copies(<138 copies/mL). A negative result must be combined with clinical observations, patient history, and epidemiological information. The expected result is Negative.  Fact Sheet for Patients:  EntrepreneurPulse.com.au  Fact Sheet for Healthcare Providers:  IncredibleEmployment.be  This test is no t yet approved or cleared by the [place] FDA and  has been authorized for detection and/or  diagnosis of SARS-CoV-2 by FDA under an Emergency Use Authorization (EUA). This EUA will remain  in effect (meaning this test can be used) for the duration of the COVID-19 declaration under Section 564(b)(1) of the Act, 21 U.S.C.section 360bbb-3(b)(1), unless the authorization is terminated  or revoked sooner.       Influenza A by PCR NEGATIVE NEGATIVE   Influenza B by PCR NEGATIVE NEGATIVE    Comment: (NOTE) The Xpert Xpress SARS-CoV-2/FLU/RSV plus assay is intended as an aid in the diagnosis of influenza from Nasopharyngeal swab specimens and should not be used as a sole basis for treatment. Nasal washings and aspirates are unacceptable for Xpert Xpress SARS-CoV-2/FLU/RSV testing.  Fact Sheet for Patients: EntrepreneurPulse.com.au  Fact Sheet for Healthcare Providers: IncredibleEmployment.be  This test is not yet approved or cleared by the [place] FDA and has been authorized for detection and/or diagnosis of SARS-CoV-2 by FDA under an Emergency Use Authorization (EUA). This EUA will remain in effect (meaning this test can be used) for the duration of the COVID-19 declaration under Section 564(b)(1) of the Act, 21 U.S.C. section 360bbb-3(b)(1), unless the authorization is terminated or revoked.  Performed at [facility], [place] [address], [place], [place] [address]   Blood culture (routine x 2)     Status: None (Preliminary result)   Collection Time: 04/09/21 12:11 AM   Specimen: BLOOD LEFT WRIST  Result Value Ref Range   Specimen Description BLOOD LEFT WRIST    Special Requests      BOTTLES DRAWN AEROBIC AND ANAEROBIC Blood Culture results may not be optimal due to an inadequate volume of blood received in culture bottles   Culture      NO GROWTH < 12 HOURS Performed at [facility], [place] [address] [place],  [address]    Report Status PENDING   Comprehensive metabolic panel     Status: Abnormal   Collection  Time: 04/09/21  2:00 AM  Result Value Ref Range   Sodium 138 135 -  145 mmol/L   Potassium 3.9 3.5 - 5.1 mmol/L   Chloride 100 98 - 111 mmol/L   CO2 25 22 - 32 mmol/L   Glucose, Bld 86 70 - 99 mg/dL    Comment: Glucose reference range applies only to samples taken after fasting for at least 8 hours.   BUN 16 6 - 20 mg/dL   Creatinine, Ser 3.54 (H) 0.44 - 1.00 mg/dL   Calcium 8.7 (L) 8.9 - 10.3 mg/dL   Total Protein 7.1 6.5 - 8.1 g/dL   Albumin 2.8 (L) 3.5 - 5.0 g/dL   AST 13 (L) 15 - 41 U/L   ALT 9 0 - 44 U/L   Alkaline Phosphatase 151 (H) 38 - 126 U/L   Total Bilirubin 0.6 0.3 - 1.2 mg/dL   GFR, Estimated 15 (L) >60 mL/min    Comment: (NOTE) Calculated using the CKD-EPI Creatinine Equation (2021)    Anion gap 13 5 - 15    Comment: Performed at [facility], [place] [address] [place], [place] [address]  Phosphorus     Status: None   Collection Time: 04/09/21  3:05 AM  Result Value Ref Range   Phosphorus 3.5 2.5 - 4.6 mg/dL    Comment: Performed at [facility] [address], [place], [place] [address]  Magnesium     Status: None   Collection Time: 04/09/21  3:05 AM  Result Value Ref Range   Magnesium 1.9 1.7 - 2.4 mg/dL    Comment: Performed at [facility] [address] [place], [place] [address]  Comprehensive metabolic panel     Status: Abnormal   Collection Time: 04/09/21  3:05 AM  Result Value Ref Range   Sodium 139 135 - 145 mmol/L   Potassium 3.7 3.5 - 5.1 mmol/L   Chloride 99 98 - 111 mmol/L   CO2 26 22 - 32 mmol/L   Glucose, Bld 95 70 - 99 mg/dL    Comment: Glucose reference range applies only to samples taken after fasting for at least 8 hours.   BUN 15 6 - 20 mg/dL   Creatinine, Ser 3.59 (H) 0.44 - 1.00 mg/dL   Calcium 8.8 (L) 8.9 - 10.3 mg/dL   Total Protein 7.2 6.5 - 8.1 g/dL   Albumin 2.8 (L) 3.5 - 5.0 g/dL   AST 12 (L) 15 - 41 U/L   ALT 9 0 - 44 U/L   Alkaline Phosphatase 126 38 - 126 U/L   Total Bilirubin 0.6 0.3 - 1.2 mg/dL    GFR, Estimated 14 (L) >60 mL/min    Comment: (NOTE) Calculated using the CKD-EPI Creatinine Equation (2021)    Anion gap 14 5 - 15    Comment: Performed at [facility], [place] [address] [place] [address]  CBC with Differential/Platelet     Status: Abnormal   Collection Time: 04/09/21  3:05 AM  Result Value Ref Range   WBC 24.8 (H) 4.0 - 10.5 K/uL   RBC 4.69 3.87 - 5.11 MIL/uL   Hemoglobin 12.4 12.0 - 15.0 g/dL   HCT 41.7 36.0 - 46.0 %   MCV 88.9 80.0 - 100.0 fL   MCH 26.4 26.0 - 34.0 pg   MCHC 29.7 (L) 30.0 - 36.0 g/dL   RDW 17.4 (H) 11.5 - 15.5 %   Platelets 300 150 - 400 K/uL   nRBC 0.0 0.0 - 0.2 %   Neutrophils Relative % 67 %   Neutro Abs  16.6 (H) 1.7 - 7.7 K/uL   Lymphocytes Relative 27 %   Lymphs Abs 6.7 (H) 0.7 - 4.0 K/uL   Monocytes Relative 5 %   Monocytes Absolute 1.2 (H) 0.1 - 1.0 K/uL   Eosinophils Relative 0 %   Eosinophils Absolute 0.0 0.0 - 0.5 K/uL   Basophils Relative 1 %   Basophils Absolute 0.2 (H) 0.0 - 0.1 K/uL   nRBC 0 0 /100 WBC   Abs Immature Granulocytes 0.00 0.00 - 0.07 K/uL    Comment: Performed at [facility] [address] [place], [place] [address]  CBG monitoring, ED     Status: Abnormal   Collection Time: 04/09/21  8:38 AM  Result Value Ref Range   Glucose-Capillary 122 (H) 70 - 99 mg/dL    Comment: Glucose reference range applies only to samples taken after fasting for at least 8 hours.  CBG monitoring, ED     Status: Abnormal   Collection Time: 04/09/21 12:31 PM  Result Value Ref Range   Glucose-Capillary 144 (H) 70 - 99 mg/dL    Comment: Glucose reference range applies only to samples taken after fasting for at least 8 hours.   CT ABDOMEN PELVIS WO CONTRAST  Result Date: 04/08/2021 CLINICAL DATA:  Abdominal pain, fever pubic swelling and labial swelling concerning for abscess/infection - dialyzed today will start with non-con EXAM: CT ABDOMEN AND PELVIS WITHOUT CONTRAST TECHNIQUE: Multidetector CT imaging of the  abdomen and pelvis was performed following the standard protocol without IV contrast. COMPARISON:  None. FINDINGS: Lower chest: No acute abnormality. Linear atelectasis versus scarring. Hepatobiliary: No focal liver abnormality. Status post cholecystectomy. No biliary dilatation. Pancreas: No focal lesion. Normal pancreatic contour. No surrounding inflammatory changes. No main pancreatic ductal dilatation. Spleen: Normal in size without focal abnormality. Adrenals/Urinary Tract: No adrenal nodule bilaterally. No nephrolithiasis and no hydronephrosis. No definite contour-deforming renal mass. No ureterolithiasis or hydroureter. Marked urinary bladder wall thickening with associated perivesicular fat stranding. Stomach/Bowel: Stomach is within normal limits. No evidence of bowel wall thickening or dilatation. Stool throughout the colon. Appendix appears normal. Vascular/Lymphatic: No abdominal aorta or iliac aneurysm. At least moderate atherosclerotic plaque of the aorta and its branches. Prominent but nonenlarged pelvic lymph nodes. No abdominal, pelvic, or inguinal lymphadenopathy. Reproductive: Status post hysterectomy. No adnexal masses. Other: No intraperitoneal free fluid. No intraperitoneal free gas. No organized fluid collection. Musculoskeletal: Tiny fat containing umbilical hernia. Mild subcutaneus soft tissue edema. Subcutaneus soft tissue fat stranding within the right labia majora with query of an approximately 2 cm underlying organized fluid collection versus phlegmon formation (6:37, 3:85, 7:63). No suspicious lytic or blastic osseous lesions. No acute displaced fracture. Surgical hardware within the right proximal femur. IMPRESSION: 1. Urinary bladder findings suggestive of infection. Correlate with urinalysis. 2. Subcutaneus soft tissue fat stranding within the right labia majora with query of an approximately 2 cm underlying organized fluid collection versus phlegmon formation. Markedly limited  evaluation on this noncontrast study. 3. Constipation. Electronically Signed   By: Iven Finn M.D.   On: 04/08/2021 23:50      Assessment/Plan Right labial, peroneal soft tissue infection The patient has a significant infectious process involving her R labial extending to her mons.  It is possibly extending into her perineum but she is unable to open her legs enough for me to be able to further assess.  I have transitioned her to zosyn and she already has vanc ordered.  Unfortunately, she has been given a diet today  and so unable to take her to the OR.  She will be NPO p MN and plan for I&D and debridement tomorrow in the OR.  This was discussed with the patient and she understands and agrees.   FEN - NPO p MN VTE - ok for prophylaxis from our standpoint ID - [email or web address]  ESRD on HD DM HTN HLD Chronic vaginitis (per care everywhere)  Henreitta Cea, [facility] 04/09/2021, 2:34 PM Please see Amion for pager number during day hours 7:00am-4:30pm or 7:00am -11:30am on weekends

## 2021-04-09 NOTE — ED Notes (Signed)
Pt given nutritional supplement and lunch per gen surg. Surgery scheduled for am. NPO after MN.

## 2021-04-09 NOTE — H&P (Signed)
History and Physical    PLEASE NOTE THAT [employer]AGON [employer] SOFTWARE WAS USED IN THE CONSTRUCTION OF THIS NOTE.   [name] [name] [id number] DOB: 21-Sep-1965 DOA: 04/08/2021  PCP: Myrtis Hopping., MD Patient coming from: home   I have personally briefly reviewed patient's old medical records in [place]  Chief Complaint: Abdominal erythema  HPI: [name] [name] is a 55 y.o. female with medical history significant for end-stage renal disease on hemodialysis (M,W,F), type 2 diabetes mellitus, who is admitted to [facility] on 04/08/2021 with suprapubic cellulitis after presenting from home to [facility] ED complaining of abdominal erythema.   The patient reports approximately 10 days of progressive erythema suprapubic abdomen, with extension into the labia majora, right greater than left and associated with swelling, tenderness, increased warmth, but denies any associated drainage.  Denies any associated subjective fever, chills, rigors, or generalized myalgias.  Denies rash in any other location.Denies any recent headache, neck stiffness, rhinitis, rhinorrhea, sore throat, sob, wheezing, cough, nausea, vomiting, or diarrhea.  No recent traveling or known COVID-19 exposures.   She confirms a history of end-stage renal disease for which she is undergoing hemodialysis on Monday, Wednesday, Friday schedule, and notes that she attended her regularly scheduled full hemodialysis session earlier today (Wednesday, 04/08/2021).  In the setting, she reports that she no longer produces urine.  Denies any recent chest pain, palpitations, diaphoresis, dizziness, presyncope, or syncope.  She also has a history of type 2 diabetes mellitus, which is managed via lifestyle modifications in the absence of any oral hypoglycemic agents or insulin as an outpatient, after most recent hemoglobin A1c noted to be 6.5% in March 2022.    ED Course:  Vital signs in the ED were notable for the following: Temperature max  100.2, heart rate 70-84; blood pressure 209/85, which improved to 170/84 following IV analgesia further noted below; respiratory rate 14-20, oxygen saturation 97 to 99% on room air.  Labs were notable for the following: CMP ordered, with result currently pending.  CBC notable for the following: Blood cell count 24,000, hemoglobin 12.8.  Lactic acid 1.3.  Screening COVID-19/influenza PCR were checked in the ED this evening, with results currently pending.  Blood cultures x2 were collected prior to initiation of IV antibiotics.  Imaging and additional notable ED work-up: CT abdomen/pelvis showed evidence of subcutaneous swelling of the suprapubic abdomen, extending into the right labia majora without evidence of subcutaneous gas.   While in the ED, the following were administered: Hydralazine 100 mg p.o. x1, morphine 4 mg IV x1, Rocephin, IV vancomycin.     Review of Systems: As per HPI otherwise 10 point review of systems negative.   Past Medical History:  Diagnosis Date   Colitis    Diabetes mellitus    Family history of adverse reaction to anesthesia    " my uncle's heart stoped "   Fracture of femoral neck, right ([place]) 10/02/2020   GERD (gastroesophageal reflux disease)    Heart murmur    Hypertension    Impaired vision in both eyes 11/19/2015   Mitral regurgitation    Neuropathy in diabetes ([place])    Syncope and collapse 07/28/2015    Past Surgical History:  Procedure Laterality Date   ABDOMINAL HYSTERECTOMY     CATARACT EXTRACTION     CESAREAN SECTION     CHOLECYSTECTOMY     EYE SURGERY Left retinal   GANGLION CYST EXCISION     HIP PINNING,CANNULATED Right 10/02/2020   Procedure:  CANNULATED HIP PINNING;  Surgeon: Altamese [place], MD;  Location: [place];  Service: Orthopedics;  Laterality: Right;   IR FLUORO GUIDE CV LINE LEFT  10/13/2020   IR PTA VENOUS EXCEPT DIALYSIS CIRCUIT  10/13/2020   ORIF TIBIA PLATEAU Left 10/09/2020   Procedure: OPEN REDUCTION INTERNAL FIXATION (ORIF)  TIBIAL PLATEAU;  Surgeon: Altamese , MD;  Location: [place];  Service: Orthopedics;  Laterality: Left;   TONSILLECTOMY     TUBAL LIGATION      Social History:  reports that she has never smoked. She has never used smokeless tobacco. She reports that she does not drink alcohol and does not use drugs.   Allergies  Allergen Reactions   Amitriptyline Swelling    Other reaction(s): Confusion (intolerance)   Ciprofloxacin Diarrhea and Nausea And Vomiting    VOMITING Severe vomiting requiring ED visit, IV reglan and IV fluids VOMITING    Semaglutide Nausea Only   Tramadol Itching   Tylenol [Acetaminophen] Itching    Family History  Problem Relation Age of Onset   Diabetes Father    Heart attack Father    Aneurysm Mother     Family history reviewed and not pertinent    Prior to Admission medications   Medication Sig Start Date End Date Taking? Authorizing Provider  BISACODYL PO Take 5 mg by mouth daily as needed (constipation).   Yes [provider]  gabapentin (NEURONTIN) 100 MG capsule Take 100 mg by mouth daily as needed (pain).   Yes [provider]  lidocaine (LIDODERM) 5 % Place 1 patch onto the skin daily. Remove & Discard patch within 12 hours or as directed by MD 10/16/20  Yes Alexandria Lodge, MD  gabapentin (NEURONTIN) 100 MG capsule Take 200 mg by mouth at bedtime as needed (pain). Patient not taking: Reported on 04/08/2021    [provider]  methocarbamol (ROBAXIN) 500 MG tablet TAKE 1 TABLET (500 MG TOTAL) BY MOUTH EVERY SIX HOURS AS NEEDED FOR MUSCLE SPASMS AND HEADACHE. Patient not taking: No sig reported 06/03/20 06/03/21  [place] Eric J, DO  senna-docusate (SENOKOT-S) 8.6-50 MG tablet Take 2 tablets by mouth 2 (two) times daily. Patient not taking: No sig reported 10/16/20   Alexandria Lodge, MD  SUMAtriptan (IMITREX) 100 MG tablet TAKE 1 TABLET (100 MG TOTAL) BY MOUTH ONCE AS NEEDED FOR MIGRAINE. MAY REPEAT IN 2 HOURS IF HEADACHE PERSISTS  OR RECURS. Patient not taking: No sig reported 06/03/20 06/03/21  [place] Eric J, DO     Objective    Physical Exam: Vitals:   04/08/21 2300 04/08/21 2315 04/09/21 0048 04/09/21 0049  BP: (!) 207/84 (!) 212/88 (!) 224/87   Pulse: 70 74 78 78  Resp: '14 16 16 15  '$ Temp:      TempSrc:      SpO2: 97% 97% 97% 97%    General: appears to be stated age; alert, oriented Skin: warm, dry; suprapubic abdominal erythema associated with mild swelling and extension into the right labia majora;  Head:  AT/[place] Mouth:  Oral mucosa membranes appear moist, normal dentition Neck: supple; trachea midline Heart:  RRR; did not appreciate any M/R/G Lungs: CTAB, did not appreciate any wheezes, rales, or rhonchi Abdomen: + BS; soft, ND, NT Vascular: 2+ pedal pulses b/l; 2+ radial pulses b/l Extremities: no peripheral edema, no muscle wasting Neuro: strength and sensation intact in upper and lower extremities b/l     Labs on Admission: I have personally reviewed following labs and imaging studies  CBC:  Recent Labs  Lab 04/08/21 2200  WBC 24.5*  NEUTROABS 15.4*  HGB 12.8  HCT 40.1  MCV 84.2  PLT 123XX123   Basic Metabolic Panel: No results for input(s): NA, K, CL, CO2, GLUCOSE, BUN, CREATININE, CALCIUM, MG, PHOS in the last 168 hours. GFR: CrCl cannot be calculated (Patient's most recent lab result is older than the maximum 21 days allowed.). Liver Function Tests: No results for input(s): AST, ALT, ALKPHOS, BILITOT, PROT, ALBUMIN in the last 168 hours. No results for input(s): LIPASE, AMYLASE in the last 168 hours. No results for input(s): AMMONIA in the last 168 hours. Coagulation Profile: No results for input(s): INR, PROTIME in the last 168 hours. Cardiac Enzymes: No results for input(s): CKTOTAL, CKMB, CKMBINDEX, TROPONINI in the last 168 hours. BNP (last 3 results) No results for input(s): PROBNP in the last 8760 hours. HbA1C: No results for input(s): HGBA1C in the last 72  hours. CBG: No results for input(s): GLUCAP in the last 168 hours. Lipid Profile: No results for input(s): CHOL, HDL, LDLCALC, TRIG, CHOLHDL, LDLDIRECT in the last 72 hours. Thyroid Function Tests: No results for input(s): TSH, T4TOTAL, FREET4, T3FREE, THYROIDAB in the last 72 hours. Anemia Panel: No results for input(s): VITAMINB12, FOLATE, FERRITIN, TIBC, IRON, RETICCTPCT in the last 72 hours. Urine analysis:    Component Value Date/Time   COLORURINE STRAW (A) 03/07/2020 1123   APPEARANCEUR TURBID (A) 03/07/2020 1123   LABSPEC 1.019 03/07/2020 1123   PHURINE 7.0 03/07/2020 1123   GLUCOSEU NEGATIVE 03/07/2020 1123   GLUCOSEU >=1000 (A) 11/19/2015 1505   HGBUR SMALL (A) 03/07/2020 1123   BILIRUBINUR NEGATIVE 03/07/2020 1123   BILIRUBINUR neg 03/13/2015 1858   KETONESUR NEGATIVE 03/07/2020 1123   PROTEINUR >=300 (A) 03/07/2020 1123   UROBILINOGEN 0.2 11/19/2015 1505   NITRITE NEGATIVE 03/07/2020 1123   LEUKOCYTESUR MODERATE (A) 03/07/2020 1123    Radiological Exams on Admission: CT ABDOMEN PELVIS WO CONTRAST  Result Date: 04/08/2021 CLINICAL DATA:  Abdominal pain, fever pubic swelling and labial swelling concerning for abscess/infection - dialyzed today will start with non-con EXAM: CT ABDOMEN AND PELVIS WITHOUT CONTRAST TECHNIQUE: Multidetector CT imaging of the abdomen and pelvis was performed following the standard protocol without IV contrast. COMPARISON:  None. FINDINGS: Lower chest: No acute abnormality. Linear atelectasis versus scarring. Hepatobiliary: No focal liver abnormality. Status post cholecystectomy. No biliary dilatation. Pancreas: No focal lesion. Normal pancreatic contour. No surrounding inflammatory changes. No main pancreatic ductal dilatation. Spleen: Normal in size without focal abnormality. Adrenals/Urinary Tract: No adrenal nodule bilaterally. No nephrolithiasis and no hydronephrosis. No definite contour-deforming renal mass. No ureterolithiasis or hydroureter.  Marked urinary bladder wall thickening with associated perivesicular fat stranding. Stomach/Bowel: Stomach is within normal limits. No evidence of bowel wall thickening or dilatation. Stool throughout the colon. Appendix appears normal. Vascular/Lymphatic: No abdominal aorta or iliac aneurysm. At least moderate atherosclerotic plaque of the aorta and its branches. Prominent but nonenlarged pelvic lymph nodes. No abdominal, pelvic, or inguinal lymphadenopathy. Reproductive: Status post hysterectomy. No adnexal masses. Other: No intraperitoneal free fluid. No intraperitoneal free gas. No organized fluid collection. Musculoskeletal: Tiny fat containing umbilical hernia. Mild subcutaneus soft tissue edema. Subcutaneus soft tissue fat stranding within the right labia majora with query of an approximately 2 cm underlying organized fluid collection versus phlegmon formation (6:37, 3:85, 7:63). No suspicious lytic or blastic osseous lesions. No acute displaced fracture. Surgical hardware within the right proximal femur. IMPRESSION: 1. Urinary bladder findings suggestive of infection. Correlate with urinalysis. 2. Subcutaneus soft  tissue fat stranding within the right labia majora with query of an approximately 2 cm underlying organized fluid collection versus phlegmon formation. Markedly limited evaluation on this noncontrast study. 3. Constipation. Electronically Signed   By: Iven Finn M.D.   On: 04/08/2021 23:50       Assessment/Plan   [name] [name] is a 55 y.o. female with medical history significant for end-stage renal disease on hemodialysis (M,W,F), type 2 diabetes mellitus, who is admitted to [facility] on 04/08/2021 with suprapubic cellulitis after presenting from home to [facility] ED complaining of abdominal erythema.    Principal Problem:   Cellulitis of suprapubic region Active Problems:   ESRD (end stage renal disease) (HCC)   Leukocytosis   DM2 (diabetes mellitus, type 2) (HCC)     #)  Suprapubic cellulitis: 10 days of progressive suprapubic erythema associated with tenderness, swelling, increased warmth, extension into the right labia majora, with CT abdomen/pelvis was being associated subcutaneous swelling without evidence of subcutaneous gas to suggest necrotizing fasciitis.  Does not meet criteria for sepsis at this time, as the only SIRS criteria currently present is that of leukocytosis.  No evidence of hypotension.  Additionally, initial lactate nonelevated at 1.3.  Blood cultures x2 collected in the ED prior to initiation of IV Rocephin and vancomycin, which will be continued.  Plan: Follow-up results of blood cultures x2.  Continue Rocephin and IV vancomycin.  Repeat CBC with differential in the morning.  fentanyl 25 mcg IV every 2 hours as needed.  Follow for results of CMP.    #) Leukocytosis: Appears related to presenting infection in the form of suprapubic cellulitis, as further outlined above, in the absence of overt sepsis.  No evidence of additional underlying infectious process at this time, although COVID-19/influenza PCR result remains pending at this time.  Patient confirms that she is an uric in the context of end-stage renal disease on hemodialysis.  Given alternate source of infection and the absence of no acute respiratory symptoms, will refrain from pursuit of chest x-ray at this time, with close ensuing clinical trending of patient's status.   Plan: Further evaluation management of suprapubic cellulitis, including continuation of IV antibiotics, as above.  Repeat CBC with differential in the morning.  Follow for results of COVID-19/influenza PCR.       #) End-stage renal disease: On hemodialysis on Monday, Wednesday, Friday schedule, with most recent completed hemodialysis session occurring earlier today, on Wednesday, 04/08/2021.  CMP ordered in the ED this evening, with result currently pending.  No evidence of overt volume overload at this time.  Plan:  Monitor strict I's and O's Daily weights.  Follow for result of CMP.  Check serum magnesium and phosphorus levels, with plan to repeat CMP in the morning.  The patient remains in the hospital leading up to this Friday, will warrant nephrology consultation to assist with arranging for routine hemodialysis while in the hospital.      #) Type 2 diabetes mellitus: A documented history of such, for which the patient is now managed via lifestyle modifications in the absence of any oral hypoglycemic agents nor any insulin as an outpatient.  Of note, most recent hemoglobin A1c was noted to be 6.5% in March 2022.  Plan: Accu-Cheks every 6 hours with low-dose sliding scale insulin.      DVT prophylaxis: SCDs Code Status: Full code Family Communication: none Disposition Plan: Per Rounding Team Consults called: none  Admission status: Inpatient; pcu   Of note, this patient  was added by me to the following Admit List/Treatment Team:  mcadmits.   Of note, the Adult Admission Order Set (Multimorbid order set) was used by me in the admission process for this patient.  PLEASE NOTE THAT [employer]AGON DICTATION SOFTWARE WAS USED IN THE CONSTRUCTION OF THIS NOTE.   Rhetta Mura DO [facility] Pager [phone] From [place]   04/09/2021, 1:01 AM

## 2021-04-09 NOTE — Consult Note (Addendum)
Gynecology Consult Note  Date of Consult: 04/09/2021   Requesting Provider: [facility]  Primary OBGYN: Patient states she sees a doctor in [facility] but doesn't remember her name (no notes in care everywhere) Primary Care Provider: Myrtis Hopping.  Reason for Consult: right labia and mons pubis cellulitis  History of Present Illness: Ms. Rupel is a 55 y.o. G3P3(No LMP recorded. Patient has had a hysterectomy.), with the above CC. PMHx is significant for multiple inguinal surgeries, pubic symphysis septic arthritis, ESRD, HTN, DM.    Patient states she's pain and discomfort on the right labia area for about the past 10 days. She unfortunately missed her GYN appt but a weekly abx was sent in; pt unsure of what exactly it was. S/s got progressively worse so patient came to ED for evaluation.  Patient admitted by hospitalist team and started on vanc and rocephin.   She states that she had similar episode (pt unsure when) but it was on both sides and she needed multiple surgeries; she states it was done in [facility].   ROS: A 12-point review of systems was performed and negative, except as stated in the above HPI.  OBGYN History: As per HPI. OB History  Gravida Para Term Preterm AB Living  '3 3 3        '$ SAB IAB Ectopic Multiple Live Births          3    # Outcome Date GA Lbr Len/2nd Weight Sex Delivery Anes PTL Lv  3 Term           2 Term           1 Term             Obstetric Comments  Vag delivery x 2. C-section x 1   HRT use: No   Past Medical History: Past Medical History:  Diagnosis Date   Colitis    Diabetes mellitus    ESRD (end stage renal disease) ([place])    on HD (M,W,F)   Family history of adverse reaction to anesthesia    " my uncle's heart stoped "   Fracture of femoral neck, right ([place]) 10/02/2020   GERD (gastroesophageal reflux disease)    Heart murmur    Hypertension    Impaired vision in both eyes 11/19/2015   Mitral regurgitation    Neuropathy  in diabetes ([place])    Syncope and collapse 07/28/2015    Past Surgical History: Past Surgical History:  Procedure Laterality Date   ABDOMINAL HYSTERECTOMY  1999   for fibroids. she thinks one ovary was left   CATARACT EXTRACTION     CESAREAN SECTION     CHOLECYSTECTOMY     EYE SURGERY Left retinal   GANGLION CYST EXCISION     HIP PINNING,CANNULATED Right 10/02/2020   Procedure: CANNULATED HIP PINNING;  Surgeon: Altamese Kilmarnock, MD;  Location: [place];  Service: Orthopedics;  Laterality: Right;   IR FLUORO GUIDE CV LINE LEFT  10/13/2020   IR PTA VENOUS EXCEPT DIALYSIS CIRCUIT  10/13/2020   ORIF TIBIA PLATEAU Left 10/09/2020   Procedure: OPEN REDUCTION INTERNAL FIXATION (ORIF) TIBIAL PLATEAU;  Surgeon: Altamese Harford, MD;  Location: [place];  Service: Orthopedics;  Laterality: Left;   TONSILLECTOMY     TUBAL LIGATION      Family History:  Family History  Problem Relation Age of Onset   Diabetes Father    Heart attack Father    Aneurysm Mother     Social  History:  Social History   Socioeconomic History   Marital status: Married    Spouse name: Not on file   Number of children: 3   Years of education: College   Highest education level: Not on file  Occupational History   Occupation: Disabled  Tobacco Use   Smoking status: Never   Smokeless tobacco: Never  Substance and Sexual Activity   Alcohol use: No    Alcohol/week: 0.0 standard drinks   Drug use: No   Sexual activity: Yes  Other Topics Concern   Not on file  Social History Narrative   Drinks about 1 cup of coffee in the morning.    Social Determinants of Health   Financial Resource Strain: Not on file  Food Insecurity: Not on file  Transportation Needs: Not on file  Physical Activity: Not on file  Stress: Not on file  Social Connections: Not on file  Intimate Partner Violence: Not on file    Allergy: Allergies  Allergen Reactions   Amitriptyline Swelling    Other reaction(s): Confusion (intolerance)    Ciprofloxacin Diarrhea and Nausea And Vomiting    VOMITING Severe vomiting requiring ED visit, IV reglan and IV fluids VOMITING    Semaglutide Nausea Only   Tramadol Itching   Tylenol [Acetaminophen] Itching      Hospital Medications: Current Facility-Administered Medications  Medication Dose Route Frequency Provider Last Rate Last Admin   cefTRIAXone (ROCEPHIN) 1 g in sodium chloride 0.9 % 100 mL IVPB  1 g Intravenous Q24H Howerter, Justin B, DO       ibuprofen (ADVIL) tablet 400 mg  400 mg Oral Q6H PRN Howerter, Justin B, DO   400 mg at 04/09/21 [id number]   insulin aspart (novoLOG) injection 0-6 Units  0-6 Units Subcutaneous TID WC Howerter, Justin B, DO       metroNIDAZOLE (FLAGYL) IVPB 500 mg  500 mg Intravenous Q8H Aletha Halim, MD       naloxone [facility]) injection 0.4 mg  0.4 mg Intravenous PRN Howerter, Justin B, DO       ondansetron (ZOFRAN) injection 4 mg  4 mg Intravenous Q6H PRN Howerter, Justin B, DO   4 mg at 04/09/21 [id number]   oxyCODONE (Oxy IR/ROXICODONE) immediate release tablet 5 mg  5 mg Oral Q4H PRN Darliss Cheney, MD   5 mg at 04/09/21 1018   polyethylene glycol (MIRALAX / GLYCOLAX) packet 17 g  17 g Oral Daily Darliss Cheney, MD       [START ON 04/10/2021] vancomycin (VANCOCIN) IVPB 750 mg/150 ml premix  750 mg Intravenous Q M,W,F-HD Erenest Blank, RPH       Current Outpatient Medications  Medication Sig Dispense Refill   BISACODYL PO Take 5 mg by mouth daily as needed (constipation).     gabapentin (NEURONTIN) 100 MG capsule Take 100 mg by mouth daily as needed (pain).     lidocaine (LIDODERM) 5 % Place 1 patch onto the skin daily. Remove & Discard patch within 12 hours or as directed by MD 7 patch 0   gabapentin (NEURONTIN) 100 MG capsule Take 200 mg by mouth at bedtime as needed (pain). (Patient not taking: Reported on 04/08/2021)     methocarbamol (ROBAXIN) 500 MG tablet TAKE 1 TABLET (500 MG TOTAL) BY MOUTH EVERY SIX HOURS AS NEEDED FOR MUSCLE SPASMS AND HEADACHE.  (Patient not taking: No sig reported) 20 tablet 0   senna-docusate (SENOKOT-S) 8.6-50 MG tablet Take 2 tablets by mouth 2 (two) times daily. (Patient not  taking: No sig reported) 28 tablet 0   SUMAtriptan (IMITREX) 100 MG tablet TAKE 1 TABLET (100 MG TOTAL) BY MOUTH ONCE AS NEEDED FOR MIGRAINE. MAY REPEAT IN 2 HOURS IF HEADACHE PERSISTS OR RECURS. (Patient not taking: No sig reported) 30 tablet 0     Physical Exam:  Current Vital Signs 24h Vital Sign Ranges  T 100 F (37.8 C) Temp  Avg: 100.1 F (37.8 C)  Min: 100 F (37.8 C)  Max: 100.2 F (37.9 C)  BP (!) 141/71  BP  Min: 133/60  Max: 224/87  HR 69 Pulse  Avg: 75.7  Min: 67  Max: 84  RR 11 Resp  Avg: 14.5  Min: 10  Max: 20  SaO2 96 % RA SpO2  Avg: 98.2 %  Min: 96 %  Max: 100 %       24 Hour I/O Current Shift I/O  Time Ins Outs No intake/output data recorded. No intake/output data recorded.   Patient Vitals for the past 24 hrs:  BP Temp Temp src Pulse Resp SpO2  04/09/21 0945 (!) 141/71 -- -- 69 11 96 %  04/09/21 0930 139/68 -- -- 72 10 97 %  04/09/21 0900 (!) 147/77 -- -- 75 15 97 %  04/09/21 0855 134/71 -- -- 74 14 99 %  04/09/21 0600 (!) 152/66 -- -- 79 14 100 %  04/09/21 0514 -- 100 F (37.8 C) Axillary -- -- --  04/09/21 0445 138/65 -- -- 79 13 99 %  04/09/21 0430 133/60 -- -- 77 12 99 %  04/09/21 0249 (!) 141/76 -- -- 79 19 100 %  04/09/21 0230 (!) 154/62 -- -- 67 13 100 %  04/09/21 0130 (!) 181/85 -- -- 73 11 100 %  04/09/21 0049 -- -- -- 78 15 97 %  04/09/21 0048 (!) 224/87 -- -- 78 16 97 %  04/08/21 2315 (!) 212/88 -- -- 74 16 97 %  04/08/21 2300 (!) 207/84 -- -- 70 14 97 %  04/08/21 2145 (!) 223/84 -- -- 79 20 98 %  04/08/21 2100 (!) 219/86 -- -- 80 18 99 %  04/08/21 2052 -- 100.1 F (37.8 C) Axillary -- -- --  04/08/21 1724 (!) 209/85 100.2 F (37.9 C) Oral 84 16 98 %    General appearance: Well nourished, well developed female in no acute distress.  Cardiovascular: normal HR, no MRGs Respiratory:   Clear to auscultation bilateral. Normal respiratory effort Abdomen: soft, nttp, nd Neuro/Psych:  Normal mood and affect.  Skin:  Warm and dry.  Extremities: no clubbing, cyanosis, or edema.   Pelvic exam: Mons pubis tense and edematous and moderately ttp and the enlarged area encompasses nearly the entire mons area. Exam limited by patient discomfort and being on a regular hospital bed, but right labia is edematous, moderately ttp from at least the rectal area to the mons area. Right leg past the inguinal fold nttp. +drainage of yellowish discharge, unable to tell from where   Laboratory: Recent Labs  Lab 04/08/21 2200 04/09/21 0305  WBC 24.5* 24.8*  HGB 12.8 12.4  HCT 40.1 41.7  PLT 381 300   Recent Labs  Lab 04/09/21 0200 04/09/21 0305  NA 138 139  K 3.9 3.7  CL 100 99  CO2 25 26  BUN 16 15  CREATININE 3.54* 3.59*  CALCIUM 8.7* 8.8*  PROT 7.1 7.2  BILITOT 0.6 0.6  ALKPHOS 151* 126  ALT 9 9  AST 13* 12*  GLUCOSE 86 95  Imaging:  Narrative & Impression  CLINICAL DATA:  Abdominal pain, fever pubic swelling and labial swelling concerning for abscess/infection - dialyzed today will start with non-con   EXAM: CT ABDOMEN AND PELVIS WITHOUT CONTRAST   TECHNIQUE: Multidetector CT imaging of the abdomen and pelvis was performed following the standard protocol without IV contrast.   COMPARISON:  None.   FINDINGS: Lower chest: No acute abnormality. Linear atelectasis versus scarring.   Hepatobiliary: No focal liver abnormality. Status post cholecystectomy. No biliary dilatation.   Pancreas: No focal lesion. Normal pancreatic contour. No surrounding inflammatory changes. No main pancreatic ductal dilatation.   Spleen: Normal in size without focal abnormality.   Adrenals/Urinary Tract:   No adrenal nodule bilaterally.   No nephrolithiasis and no hydronephrosis. No definite contour-deforming renal mass.   No ureterolithiasis or hydroureter.   Marked  urinary bladder wall thickening with associated perivesicular fat stranding.   Stomach/Bowel: Stomach is within normal limits. No evidence of bowel wall thickening or dilatation. Stool throughout the colon. Appendix appears normal.   Vascular/Lymphatic: No abdominal aorta or iliac aneurysm. At least moderate atherosclerotic plaque of the aorta and its branches. Prominent but nonenlarged pelvic lymph nodes. No abdominal, pelvic, or inguinal lymphadenopathy.   Reproductive: Status post hysterectomy. No adnexal masses.   Other: No intraperitoneal free fluid. No intraperitoneal free gas. No organized fluid collection.   Musculoskeletal:   Tiny fat containing umbilical hernia. Mild subcutaneus soft tissue edema.   Subcutaneus soft tissue fat stranding within the right labia majora with query of an approximately 2 cm underlying organized fluid collection versus phlegmon formation (6:37, 3:85, 7:63).   No suspicious lytic or blastic osseous lesions. No acute displaced fracture. Surgical hardware within the right proximal femur.   IMPRESSION: 1. Urinary bladder findings suggestive of infection. Correlate with urinalysis. 2. Subcutaneus soft tissue fat stranding within the right labia majora with query of an approximately 2 cm underlying organized fluid collection versus phlegmon formation. Markedly limited evaluation on this noncontrast study. 3. Constipation.     Electronically Signed   By: Iven Finn M.D.   On: 04/08/2021 23:50    Assessment: Ms. Zilles is a 55 y.o. G3P3 with recurrent pelvic cellulitis; pt stable  Plan: I called pharmacy and I recommend flagyl '500mg'$  IV q8h and this should be fine given her renal status.  I d/w the patient that I don't believe IV abx are going to be successful in resolving the infection.  I looked back in care everywhere and she had at least three surgeries at [facility] by general surgery for this back in April-may 2021. I discussed this with  her primary team and recommend that general surgery evaluate her and be the the lead in terms of any potential surgical management because her history and need for multiples surgeries by them to resolve this issue along with input from IR, Urology, Ortho, ID  Patient had a negative GC/CT swab when seen by GYN consult last year and it was negative. She had a swab by her PCP in the summer that was negative for trichomonas (pt not sexually active). She was + for candida glabrata which is very difficult to treat, especially given her co-morbidities. I don't recommend repeat testing at this time or treatment  Total time taking care of the patient was 45 minutes, with greater than 50% of the time spent in face to face interaction with the patient.  Durene Romans MD Attending Center for [place] [employer] [facility]) GYN Consult Phone: [phone] (  M-F, 0800-1700) & [phone] (Off hours, weekends, holidays)

## 2021-04-09 NOTE — Progress Notes (Signed)
This is 55 year old gentleman with end-stage renal disease on hemodialysis and type 2 diabetes mellitus who basically presented with pain and erythema of the suprapubic/inguinal area.  She was diagnosed with extensive cellulitis and possibly abscess of the right labia majora based on the CT scan.  Patient seen and examined, still in the ED.  Complains of severe pain.  On examination has severe erythema involving almost 100% of the inguinal area and some whitish vaginal discharge as well.  She is too tender to palpate and thus is not allowing complete examination.  She has been started on broad-spectrum antibiotics to cover MRSA as well which I will continue.  I have consulted gynecology and discussed case with Dr. Ilda Basset as she might need I&D of the area.  Is complaining of pain I will start her on as needed oxycodone.  She is already on ibuprofen as needed.  She is allergic to acetaminophen. Please refer to detailed H&P by our colleagues/admitting hospitalist.  I have consulted nephrology for her dialysis, she is due tomorrow.

## 2021-04-09 NOTE — ED Notes (Signed)
Meal tray not received. Meal re-ordered. Previously seen by GYN. Gen Surg at [facility]

## 2021-04-09 NOTE — Progress Notes (Signed)
Pharmacy Antibiotic Note  [name] [name] is a 55 y.o. female admitted on 04/08/2021 with cellulitis.  Pharmacy has been consulted for Vancomycin dosing. Cellulitis of the labia, right greater than left. WBC elevated. ESRD on HD MWF.   Plan: -Vancomycin 1250 mg IV x 1, then 750 mg IV qHD MWF -Ceftriaxone per MD -Trend WBC, temp, HD schedule -F/U infectious work-up -Drug levels as indicated      Temp (24hrs), Avg:100.2 F (37.9 C), Min:100.1 F (37.8 C), Max:100.2 F (37.9 C)  Recent Labs  Lab 04/08/21 2200  WBC 24.5*  LATICACIDVEN 1.3    CrCl cannot be calculated (Patient's most recent lab result is older than the maximum 21 days allowed.).    Allergies  Allergen Reactions   Amitriptyline Swelling    Other reaction(s): Confusion (intolerance)   Ciprofloxacin Diarrhea and Nausea And Vomiting    VOMITING Severe vomiting requiring ED visit, IV reglan and IV fluids VOMITING    Semaglutide Nausea Only   Tramadol Itching   Tylenol [Acetaminophen] Itching    Narda Bonds, PharmD, BCPS Clinical Pharmacist Phone: [phone]

## 2021-04-09 NOTE — ED Notes (Signed)
Pain and nausea continue. Reports miralax does not work for her constipation. She takes ducolax at home. Verbalizes concern about constipation.

## 2021-04-09 NOTE — ED Notes (Signed)
No response from secure messaging. Dr. Curly Rim for pain med order. Pt updated.

## 2021-04-09 NOTE — ED Notes (Addendum)
MD Howerter at bedside - verbal order to DC lactic and urine

## 2021-04-09 NOTE — H&P (View-Only) (Signed)
[name] [name] 06/24/1966  [place]:[id number].    Requesting MD: Dr. Darliss Cheney Chief Complaint/Reason for Consult: right labial abscess  HPI:  This is a 55 yo black female with a history of DM, ESRD on HD, HTN, and prior pubic symphysis osteomyelitis and vulvar/perineal infection requiring multiple I&Ds at [facility].  All of her care is through [facility].  She has healed well from all of this according to her.    About 2 weeks ago she began to notice pain in her right labia again.  This has continued to progress.  Unclear why she hasn't sought treatment prior to now, so no abx therapies until now.  She denies any N/V, urinary issues, but some constipation.  She denies any fevers at home, but has had some chills, although she states she is always cold.  She is unsure when this started draining.  Due to persistent pain, she presented to the ED for evaluation.  She was noted to have a right labial abscess and request for bed at [facility] was made but denied apparently.  She has been admitted here and we have been asked to see her.  ROS: ROS: Please see HPI, otherwise all other systems have been reviewed and are negative.  She does not void much secondary to ESRD and her normal HD days are MWF.  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 she thinks one ovary was  left   CATARACT EXTRACTION     CESAREAN SECTION     CHOLECYSTECTOMY     EYE SURGERY Left retinal   GANGLION CYST EXCISION     HIP PINNING,CANNULATED Right 10/02/2020   Procedure: CANNULATED HIP PINNING;  Surgeon: Altamese [place], MD;  Location: [place];  Service: Orthopedics;  Laterality: Right;   IR FLUORO GUIDE CV LINE LEFT  10/13/2020   IR PTA VENOUS EXCEPT DIALYSIS CIRCUIT  10/13/2020   ORIF TIBIA PLATEAU Left 10/09/2020   Procedure: OPEN REDUCTION INTERNAL FIXATION (ORIF) TIBIAL PLATEAU;  Surgeon: Altamese [place], MD;  Location: [place];  Service: Orthopedics;  Laterality: Left;   TONSILLECTOMY     TUBAL LIGATION      Social History:  reports that she has never smoked. She has never used smokeless tobacco. She reports that she does not drink alcohol and does not use drugs.  Allergies:  Allergies  Allergen Reactions   Amitriptyline Swelling    Other reaction(s): Confusion (intolerance)   Ciprofloxacin Diarrhea and Nausea And Vomiting    VOMITING Severe vomiting requiring ED visit, IV reglan and IV fluids VOMITING    Semaglutide Nausea Only   Tramadol Itching   Tylenol [Acetaminophen] Itching    (Not in a hospital admission)  Physical Exam: Blood pressure (!) 205/79, pulse 71, temperature 100 F (37.8 C), temperature source Axillary, resp. rate 10, height 5' (1.524 m), weight 61.2 kg, SpO2 99 %. General: pleasant, WD, WN black female who is laying in bed in NAD HEENT: head is normocephalic, atraumatic.  Sclera are noninjected.  PERRL.  Ears and nose without any masses or lesions.  Mouth is pink and moist.  Face with some redness diffusely but more c/w dermatitis over infection. Heart: regular, rate, and rhythm.  Normal s1,s2. No obvious murmurs, gallops, or rubs noted.  Palpable radial and pedal pulses bilaterally Lungs: CTAB, no wheezes, rhonchi, or rales noted.  Respiratory effort nonlabored.  Left chest with tunneled HD cath in place Abd: soft, NT, ND, +BS, no masses,  hernias, or organomegaly GU: significant right labial erythema, pain, induration extending into her mons.  The labia feels fluctuant, but she will barely let me touch this so difficult to fully tell.  She has purulent drainage noted from right groin all the way down on the diaper she is wearing.  Unclear due to visibility issues exactly where this is coming from.  Non-tender on left side  MS: all 4 extremities are symmetrical with no cyanosis, clubbing, or edema.  BLE weakness and needs help moving her extremities Skin: warm and dry with no masses, lesions, or rashes Neuro: Cranial nerves 2-12 grossly intact, sensation is normal throughout Psych: A&Ox3 with an appropriate affect.   Results for orders placed or performed during the hospital encounter of 04/08/21 (from the past 48 hour(s))  CBC with Differential/Platelet     Status: Abnormal   Collection Time: 04/08/21 10:00 PM  Result Value Ref Range   WBC 24.5 (H) 4.0 - 10.5 K/uL   RBC 4.76 3.87 - 5.11 MIL/uL   Hemoglobin 12.8 12.0 - 15.0 g/dL   HCT 40.1 36.0 - 46.0 %   MCV 84.2 80.0 - 100.0 fL   MCH 26.9 26.0 - 34.0 pg   MCHC 31.9 30.0 - 36.0 g/dL   RDW 17.4 (H) 11.5 - 15.5 %   Platelets 381 150 - 400 K/uL   nRBC 0.0 0.0 - 0.2 %   Neutrophils Relative % 63 %   Neutro Abs 15.4 (H) 1.7 - 7.7 K/uL   Lymphocytes Relative 30 %   Lymphs Abs 7.4 (H) 0.7 - 4.0 K/uL   Monocytes Relative 5 %   Monocytes Absolute 1.2 (H) 0.1 - 1.0 K/uL   Eosinophils Relative 1 %   Eosinophils Absolute 0.2 0.0 - 0.5 K/uL   Basophils Relative 0 %   Basophils Absolute 0.0 0.0 - 0.1 K/uL   nRBC 0 0 /100 WBC   Metamyelocytes Relative 1 %   Abs Immature Granulocytes 0.20 (H) 0.00 - 0.07 K/uL    Comment: Performed at [facility], [address] [place], [place] [address]  Lactic acid, plasma     Status: None   Collection Time: 04/08/21 10:00 PM  Result Value Ref Range   Lactic Acid, Venous 1.3 0.5 - 1.9 mmol/L    Comment: Performed at [facility] [address] [place], [place] [address]  Blood culture (routine x 2)     Status: None (Preliminary result)   Collection Time: 04/08/21 10:40 PM   Specimen: BLOOD  Result Value Ref Range   Specimen Description BLOOD BLOOD LEFT FOREARM    Special Requests      BOTTLES DRAWN AEROBIC AND ANAEROBIC Blood Culture results may not be optimal  due to an inadequate volume of blood received in culture bottles   Culture      NO GROWTH < 12 HOURS Performed at [facility] [address], [place], [place] [address]    Report Status PENDING   Resp Panel by RT-PCR (Flu A&B, Covid) Nasopharyngeal Swab     Status: None   Collection Time: 04/09/21 12:08 AM   Specimen: Nasopharyngeal Swab; Nasopharyngeal(NP) swabs in vial transport medium  Result Value Ref Range   SARS Coronavirus 2 by RT PCR NEGATIVE NEGATIVE    Comment: (NOTE) SARS-CoV-2 target nucleic acids are NOT DETECTED.  The SARS-CoV-2 RNA is generally detectable in upper respiratory specimens during the acute phase of infection. The lowest concentration of SARS-CoV-2 viral copies this assay can detect is 138 copies/mL. A negative result does not preclude SARS-Cov-2 infection and should not be used as the sole basis for treatment or other patient management decisions. A negative result may occur with  improper specimen collection/handling, submission of specimen other than nasopharyngeal swab, presence of viral mutation(s) within the areas targeted by this assay, and inadequate number of viral copies(<138 copies/mL). A negative result must be combined with clinical observations, patient history, and epidemiological information. The expected result is Negative.  Fact Sheet for Patients:  EntrepreneurPulse.com.au  Fact Sheet for Healthcare Providers:  IncredibleEmployment.be  This test is no t yet approved or cleared by the [place] FDA and  has been authorized for detection and/or  diagnosis of SARS-CoV-2 by FDA under an Emergency Use Authorization (EUA). This EUA will remain  in effect (meaning this test can be used) for the duration of the COVID-19 declaration under Section 564(b)(1) of the Act, 21 U.S.C.section 360bbb-3(b)(1), unless the authorization is terminated  or revoked sooner.       Influenza A by PCR NEGATIVE NEGATIVE   Influenza B by PCR NEGATIVE NEGATIVE    Comment: (NOTE) The Xpert Xpress SARS-CoV-2/FLU/RSV plus assay is intended as an aid in the diagnosis of influenza from Nasopharyngeal swab specimens and should not be used as a sole basis for treatment. Nasal washings and aspirates are unacceptable for Xpert Xpress SARS-CoV-2/FLU/RSV testing.  Fact Sheet for Patients: EntrepreneurPulse.com.au  Fact Sheet for Healthcare Providers: IncredibleEmployment.be  This test is not yet approved or cleared by the [place] FDA and has been authorized for detection and/or diagnosis of SARS-CoV-2 by FDA under an Emergency Use Authorization (EUA). This EUA will remain in effect (meaning this test can be used) for the duration of the COVID-19 declaration under Section 564(b)(1) of the Act, 21 U.S.C. section 360bbb-3(b)(1), unless the authorization is terminated or revoked.  Performed at [facility], [place] [address], [place], [place] [address]   Blood culture (routine x 2)     Status: None (Preliminary result)   Collection Time: 04/09/21 12:11 AM   Specimen: BLOOD LEFT WRIST  Result Value Ref Range   Specimen Description BLOOD LEFT WRIST    Special Requests      BOTTLES DRAWN AEROBIC AND ANAEROBIC Blood Culture results may not be optimal due to an inadequate volume of blood received in culture bottles   Culture      NO GROWTH < 12 HOURS Performed at [facility], [place] [address], [place],  [address]    Report Status PENDING   Comprehensive metabolic panel     Status: Abnormal   Collection  Time: 04/09/21  2:00 AM  Result Value Ref Range   Sodium 138 135 -  145 mmol/L   Potassium 3.9 3.5 - 5.1 mmol/L   Chloride 100 98 - 111 mmol/L   CO2 25 22 - 32 mmol/L   Glucose, Bld 86 70 - 99 mg/dL    Comment: Glucose reference range applies only to samples taken after fasting for at least 8 hours.   BUN 16 6 - 20 mg/dL   Creatinine, Ser 3.54 (H) 0.44 - 1.00 mg/dL   Calcium 8.7 (L) 8.9 - 10.3 mg/dL   Total Protein 7.1 6.5 - 8.1 g/dL   Albumin 2.8 (L) 3.5 - 5.0 g/dL   AST 13 (L) 15 - 41 U/L   ALT 9 0 - 44 U/L   Alkaline Phosphatase 151 (H) 38 - 126 U/L   Total Bilirubin 0.6 0.3 - 1.2 mg/dL   GFR, Estimated 15 (L) >60 mL/min    Comment: (NOTE) Calculated using the CKD-EPI Creatinine Equation (2021)    Anion gap 13 5 - 15    Comment: Performed at [facility], [place] [address] [place], [place] [address]  Phosphorus     Status: None   Collection Time: 04/09/21  3:05 AM  Result Value Ref Range   Phosphorus 3.5 2.5 - 4.6 mg/dL    Comment: Performed at [facility] [address] [place], [place] [address]  Magnesium     Status: None   Collection Time: 04/09/21  3:05 AM  Result Value Ref Range   Magnesium 1.9 1.7 - 2.4 mg/dL    Comment: Performed at [facility] [address], [place], [place] [address]  Comprehensive metabolic panel     Status: Abnormal   Collection Time: 04/09/21  3:05 AM  Result Value Ref Range   Sodium 139 135 - 145 mmol/L   Potassium 3.7 3.5 - 5.1 mmol/L   Chloride 99 98 - 111 mmol/L   CO2 26 22 - 32 mmol/L   Glucose, Bld 95 70 - 99 mg/dL    Comment: Glucose reference range applies only to samples taken after fasting for at least 8 hours.   BUN 15 6 - 20 mg/dL   Creatinine, Ser 3.59 (H) 0.44 - 1.00 mg/dL   Calcium 8.8 (L) 8.9 - 10.3 mg/dL   Total Protein 7.2 6.5 - 8.1 g/dL   Albumin 2.8 (L) 3.5 - 5.0 g/dL   AST 12 (L) 15 - 41 U/L   ALT 9 0 - 44 U/L   Alkaline Phosphatase 126 38 - 126 U/L   Total Bilirubin 0.6 0.3 - 1.2 mg/dL    GFR, Estimated 14 (L) >60 mL/min    Comment: (NOTE) Calculated using the CKD-EPI Creatinine Equation (2021)    Anion gap 14 5 - 15    Comment: Performed at [facility], [place] [address], [place] [address]  CBC with Differential/Platelet     Status: Abnormal   Collection Time: 04/09/21  3:05 AM  Result Value Ref Range   WBC 24.8 (H) 4.0 - 10.5 K/uL   RBC 4.69 3.87 - 5.11 MIL/uL   Hemoglobin 12.4 12.0 - 15.0 g/dL   HCT 41.7 36.0 - 46.0 %   MCV 88.9 80.0 - 100.0 fL   MCH 26.4 26.0 - 34.0 pg   MCHC 29.7 (L) 30.0 - 36.0 g/dL   RDW 17.4 (H) 11.5 - 15.5 %   Platelets 300 150 - 400 K/uL   nRBC 0.0 0.0 - 0.2 %   Neutrophils Relative % 67 %   Neutro Abs  16.6 (H) 1.7 - 7.7 K/uL   Lymphocytes Relative 27 %   Lymphs Abs 6.7 (H) 0.7 - 4.0 K/uL   Monocytes Relative 5 %   Monocytes Absolute 1.2 (H) 0.1 - 1.0 K/uL   Eosinophils Relative 0 %   Eosinophils Absolute 0.0 0.0 - 0.5 K/uL   Basophils Relative 1 %   Basophils Absolute 0.2 (H) 0.0 - 0.1 K/uL   nRBC 0 0 /100 WBC   Abs Immature Granulocytes 0.00 0.00 - 0.07 K/uL    Comment: Performed at [facility] [address] [place], [place] [address]  CBG monitoring, ED     Status: Abnormal   Collection Time: 04/09/21  8:38 AM  Result Value Ref Range   Glucose-Capillary 122 (H) 70 - 99 mg/dL    Comment: Glucose reference range applies only to samples taken after fasting for at least 8 hours.  CBG monitoring, ED     Status: Abnormal   Collection Time: 04/09/21 12:31 PM  Result Value Ref Range   Glucose-Capillary 144 (H) 70 - 99 mg/dL    Comment: Glucose reference range applies only to samples taken after fasting for at least 8 hours.   CT ABDOMEN PELVIS WO CONTRAST  Result Date: 04/08/2021 CLINICAL DATA:  Abdominal pain, fever pubic swelling and labial swelling concerning for abscess/infection - dialyzed today will start with non-con EXAM: CT ABDOMEN AND PELVIS WITHOUT CONTRAST TECHNIQUE: Multidetector CT imaging of the  abdomen and pelvis was performed following the standard protocol without IV contrast. COMPARISON:  None. FINDINGS: Lower chest: No acute abnormality. Linear atelectasis versus scarring. Hepatobiliary: No focal liver abnormality. Status post cholecystectomy. No biliary dilatation. Pancreas: No focal lesion. Normal pancreatic contour. No surrounding inflammatory changes. No main pancreatic ductal dilatation. Spleen: Normal in size without focal abnormality. Adrenals/Urinary Tract: No adrenal nodule bilaterally. No nephrolithiasis and no hydronephrosis. No definite contour-deforming renal mass. No ureterolithiasis or hydroureter. Marked urinary bladder wall thickening with associated perivesicular fat stranding. Stomach/Bowel: Stomach is within normal limits. No evidence of bowel wall thickening or dilatation. Stool throughout the colon. Appendix appears normal. Vascular/Lymphatic: No abdominal aorta or iliac aneurysm. At least moderate atherosclerotic plaque of the aorta and its branches. Prominent but nonenlarged pelvic lymph nodes. No abdominal, pelvic, or inguinal lymphadenopathy. Reproductive: Status post hysterectomy. No adnexal masses. Other: No intraperitoneal free fluid. No intraperitoneal free gas. No organized fluid collection. Musculoskeletal: Tiny fat containing umbilical hernia. Mild subcutaneus soft tissue edema. Subcutaneus soft tissue fat stranding within the right labia majora with query of an approximately 2 cm underlying organized fluid collection versus phlegmon formation (6:37, 3:85, 7:63). No suspicious lytic or blastic osseous lesions. No acute displaced fracture. Surgical hardware within the right proximal femur. IMPRESSION: 1. Urinary bladder findings suggestive of infection. Correlate with urinalysis. 2. Subcutaneus soft tissue fat stranding within the right labia majora with query of an approximately 2 cm underlying organized fluid collection versus phlegmon formation. Markedly limited  evaluation on this noncontrast study. 3. Constipation. Electronically Signed   By: Iven Finn M.D.   On: 04/08/2021 23:50      Assessment/Plan Right labial, peroneal soft tissue infection The patient has a significant infectious process involving her R labial extending to her mons.  It is possibly extending into her perineum but she is unable to open her legs enough for me to be able to further assess.  I have transitioned her to zosyn and she already has vanc ordered.  Unfortunately, she has been given a diet today  and so unable to take her to the OR.  She will be NPO p MN and plan for I&D and debridement tomorrow in the OR.  This was discussed with the patient and she understands and agrees.   FEN - NPO p MN VTE - ok for prophylaxis from our standpoint ID - [email or web address]  ESRD on HD DM HTN HLD Chronic vaginitis (per care everywhere)  Henreitta Cea, [facility] Surgery 04/09/2021, 2:34 PM Please see Amion for pager number during day hours 7:00am-4:30pm or 7:00am -11:30am on weekends

## 2021-04-09 NOTE — Consult Note (Signed)
[facility] Renal Consultation Note    Indication for Consultation:  Management of ESRD/hemodialysis, anemia, hypertension/volume, and secondary hyperparathyroidism. PCP:  HPI: [name] [name] is a 55 y.o. female with ESRD, T2DM, HTN, and prior inguinal area abscess requiring surgery in 2021 who is being admitted with labial cellulitis.  Presented to ED via EMS with fever and ongoing R groin cellulitis which had been ongoing for 2 weeks prior to presentation. She had been using warm compresses and an oral abx which was just recently started by her PCP without relief. Vitals significant for low-grade fever. She was not hypertensive or hypoxic. Labs with Na 139, K 3.7, Ca 8.8, WBC 24.8, Hgb 12.4, Plts 300, LA 1.3. COVID/Flu negative. CT abd/pelvis showed R labial abscess. Blood Cx collected and she was started on Vancomycin and Ceftriaxone.  GYN consulted who saw her this morning, recommended to consult general surgery for I&D and adding metronidazole to her antibiotic coverage.  Seen this afternoon in ED bed by myself. No fever or chills at the moment. No CP, dyspnea, abdominal pain. Endorses nausea and diarrhea.  Dialyzes on MWF schedule at [facility]. Last HD was yesterday - uses [facility] as access.  Past Medical History:  Diagnosis Date   Colitis    Diabetes mellitus    ESRD (end stage renal disease) ([place])    on HD (M,W,F)   Family history of adverse reaction to anesthesia    " my uncle's heart stoped "   Fracture of femoral neck, right ([place]) 10/02/2020   GERD (gastroesophageal reflux disease)    Heart murmur    Hypertension    Impaired vision in both eyes 11/19/2015   Mitral regurgitation    Neuropathy in diabetes ([place])    Syncope and collapse 07/28/2015   Past Surgical History:  Procedure Laterality Date   ABDOMINAL HYSTERECTOMY     CATARACT EXTRACTION     CESAREAN SECTION     CHOLECYSTECTOMY     EYE SURGERY Left retinal   GANGLION CYST EXCISION     HIP  PINNING,CANNULATED Right 10/02/2020   Procedure: CANNULATED HIP PINNING;  Surgeon: Altamese [place]ney [place], MD;  Location: [place];  Service: Orthopedics;  Laterality: Right;   IR FLUORO GUIDE CV LINE LEFT  10/13/2020   IR PTA VENOUS EXCEPT DIALYSIS CIRCUIT  10/13/2020   ORIF TIBIA PLATEAU Left 10/09/2020   Procedure: OPEN REDUCTION INTERNAL FIXATION (ORIF) TIBIAL PLATEAU;  Surgeon: Altamese [place], MD;  Location: [place];  Service: Orthopedics;  Laterality: Left;   TONSILLECTOMY     TUBAL LIGATION     Family History  Problem Relation Age of Onset   Diabetes Father    Heart attack Father    Aneurysm Mother    Social History:  reports that she has never smoked. She has never used smokeless tobacco. She reports that she does not drink alcohol and does not use drugs.  ROS: As per HPI otherwise negative.  Physical Exam: Vitals:   04/09/21 0855 04/09/21 0900 04/09/21 0930 04/09/21 0945  BP: 134/71 (!) 147/77 139/68 (!) 141/71  Pulse: 74 75 72 69  Resp: '14 15 10 11  '$ Temp:      TempSrc:      SpO2: 99% 97% 97% 96%     General: Well developed, well nourished, in no acute distress. Room air. Mild facial rash. Head: Normocephalic, atraumatic, sclera non-icteric, mucus membranes are moist. Neck: Supple without lymphadenopathy/masses. JVD not elevated. Lungs: Clear bilaterally to auscultation without wheezes, rales, or rhonchi.  Breathing is unlabored. Heart: RRR with normal S1, S2. No murmurs, rubs, or gallops appreciated. Abdomen: Soft, non-tender, non-distended with normoactive bowel sounds. No rebound/guarding. No obvious abdominal masses. Musculoskeletal:  Strength and tone appear normal for age. Lower extremities: No edema or ischemic changes, no open wounds. Genital/labial wound not examined by myself. Neuro: Alert and oriented X 3. Moves all extremities spontaneously. Psych:  Responds to questions appropriately with a normal affect. Dialysis Access: TDC in R chest  Allergies  Allergen Reactions    Amitriptyline Swelling    Other reaction(s): Confusion (intolerance)   Ciprofloxacin Diarrhea and Nausea And Vomiting    VOMITING Severe vomiting requiring ED visit, IV reglan and IV fluids VOMITING    Semaglutide Nausea Only   Tramadol Itching   Tylenol [Acetaminophen] Itching   Prior to Admission medications   Medication Sig Start Date End Date Taking? Authorizing Provider  BISACODYL PO Take 5 mg by mouth daily as needed (constipation).   Yes [provider]  gabapentin (NEURONTIN) 100 MG capsule Take 100 mg by mouth daily as needed (pain).   Yes [provider]  lidocaine (LIDODERM) 5 % Place 1 patch onto the skin daily. Remove & Discard patch within 12 hours or as directed by MD 10/16/20  Yes Alexandria Lodge, MD  gabapentin (NEURONTIN) 100 MG capsule Take 200 mg by mouth at bedtime as needed (pain). Patient not taking: Reported on 04/08/2021    [provider]  methocarbamol (ROBAXIN) 500 MG tablet TAKE 1 TABLET (500 MG TOTAL) BY MOUTH EVERY SIX HOURS AS NEEDED FOR MUSCLE SPASMS AND HEADACHE. Patient not taking: No sig reported 06/03/20 06/03/21  [place] Eric J, DO  senna-docusate (SENOKOT-S) 8.6-50 MG tablet Take 2 tablets by mouth 2 (two) times daily. Patient not taking: No sig reported 10/16/20   Alexandria Lodge, MD  SUMAtriptan (IMITREX) 100 MG tablet TAKE 1 TABLET (100 MG TOTAL) BY MOUTH ONCE AS NEEDED FOR MIGRAINE. MAY REPEAT IN 2 HOURS IF HEADACHE PERSISTS OR RECURS. Patient not taking: No sig reported 06/03/20 06/03/21  [place] Eric J, DO   Current Facility-Administered Medications  Medication Dose Route Frequency Provider Last Rate Last Admin   cefTRIAXone (ROCEPHIN) 1 g in sodium chloride 0.9 % 100 mL IVPB  1 g Intravenous Q24H Howerter, Justin B, DO       ibuprofen (ADVIL) tablet 400 mg  400 mg Oral Q6H PRN Howerter, Justin B, DO   400 mg at 04/09/21 [id number]   insulin aspart (novoLOG) injection 0-6 Units  0-6 Units Subcutaneous TID WC Howerter, Justin B,  DO       metroNIDAZOLE (FLAGYL) IVPB 500 mg  500 mg Intravenous Q8H Aletha Halim, MD       naloxone [facility]) injection 0.4 mg  0.4 mg Intravenous PRN Howerter, Justin B, DO       ondansetron (ZOFRAN) injection 4 mg  4 mg Intravenous Q6H PRN Howerter, Justin B, DO   4 mg at 04/09/21 [id number]   oxyCODONE (Oxy IR/ROXICODONE) immediate release tablet 5 mg  5 mg Oral Q4H PRN Darliss Cheney, MD   5 mg at 04/09/21 1018   polyethylene glycol (MIRALAX / GLYCOLAX) packet 17 g  17 g Oral Daily Pahwani, Einar Grad, MD       [START ON 04/10/2021] vancomycin (VANCOCIN) IVPB 750 mg/150 ml premix  750 mg Intravenous Q M,W,F-HD Erenest Blank, RPH       Current Outpatient Medications  Medication Sig Dispense Refill   BISACODYL PO Take 5 mg by mouth  daily as needed (constipation).     gabapentin (NEURONTIN) 100 MG capsule Take 100 mg by mouth daily as needed (pain).     lidocaine (LIDODERM) 5 % Place 1 patch onto the skin daily. Remove & Discard patch within 12 hours or as directed by MD 7 patch 0   gabapentin (NEURONTIN) 100 MG capsule Take 200 mg by mouth at bedtime as needed (pain). (Patient not taking: Reported on 04/08/2021)     methocarbamol (ROBAXIN) 500 MG tablet TAKE 1 TABLET (500 MG TOTAL) BY MOUTH EVERY SIX HOURS AS NEEDED FOR MUSCLE SPASMS AND HEADACHE. (Patient not taking: No sig reported) 20 tablet 0   senna-docusate (SENOKOT-S) 8.6-50 MG tablet Take 2 tablets by mouth 2 (two) times daily. (Patient not taking: No sig reported) 28 tablet 0   SUMAtriptan (IMITREX) 100 MG tablet TAKE 1 TABLET (100 MG TOTAL) BY MOUTH ONCE AS NEEDED FOR MIGRAINE. MAY REPEAT IN 2 HOURS IF HEADACHE PERSISTS OR RECURS. (Patient not taking: No sig reported) 30 tablet 0   Labs: Basic Metabolic Panel: Recent Labs  Lab 04/09/21 0200 04/09/21 0305  NA 138 139  K 3.9 3.7  CL 100 99  CO2 25 26  GLUCOSE 86 95  BUN 16 15  CREATININE 3.54* 3.59*  CALCIUM 8.7* 8.8*  PHOS  --  3.5   Liver Function Tests: Recent Labs  Lab  04/09/21 0200 04/09/21 0305  AST 13* 12*  ALT 9 9  ALKPHOS 151* 126  BILITOT 0.6 0.6  PROT 7.1 7.2  ALBUMIN 2.8* 2.8*   CBC: Recent Labs  Lab 04/08/21 2200 04/09/21 0305  WBC 24.5* 24.8*  NEUTROABS 15.4* 16.6*  HGB 12.8 12.4  HCT 40.1 41.7  MCV 84.2 88.9  PLT 381 300   Studies/Results: CT ABDOMEN PELVIS WO CONTRAST  Result Date: 04/08/2021 CLINICAL DATA:  Abdominal pain, fever pubic swelling and labial swelling concerning for abscess/infection - dialyzed today will start with non-con EXAM: CT ABDOMEN AND PELVIS WITHOUT CONTRAST TECHNIQUE: Multidetector CT imaging of the abdomen and pelvis was performed following the standard protocol without IV contrast. COMPARISON:  None. FINDINGS: Lower chest: No acute abnormality. Linear atelectasis versus scarring. Hepatobiliary: No focal liver abnormality. Status post cholecystectomy. No biliary dilatation. Pancreas: No focal lesion. Normal pancreatic contour. No surrounding inflammatory changes. No main pancreatic ductal dilatation. Spleen: Normal in size without focal abnormality. Adrenals/Urinary Tract: No adrenal nodule bilaterally. No nephrolithiasis and no hydronephrosis. No definite contour-deforming renal mass. No ureterolithiasis or hydroureter. Marked urinary bladder wall thickening with associated perivesicular fat stranding. Stomach/Bowel: Stomach is within normal limits. No evidence of bowel wall thickening or dilatation. Stool throughout the colon. Appendix appears normal. Vascular/Lymphatic: No abdominal aorta or iliac aneurysm. At least moderate atherosclerotic plaque of the aorta and its branches. Prominent but nonenlarged pelvic lymph nodes. No abdominal, pelvic, or inguinal lymphadenopathy. Reproductive: Status post hysterectomy. No adnexal masses. Other: No intraperitoneal free fluid. No intraperitoneal free gas. No organized fluid collection. Musculoskeletal: Tiny fat containing umbilical hernia. Mild subcutaneus soft tissue edema.  Subcutaneus soft tissue fat stranding within the right labia majora with query of an approximately 2 cm underlying organized fluid collection versus phlegmon formation (6:37, 3:85, 7:63). No suspicious lytic or blastic osseous lesions. No acute displaced fracture. Surgical hardware within the right proximal femur. IMPRESSION: 1. Urinary bladder findings suggestive of infection. Correlate with urinalysis. 2. Subcutaneus soft tissue fat stranding within the right labia majora with query of an approximately 2 cm underlying organized fluid collection versus phlegmon formation. Markedly  limited evaluation on this noncontrast study. 3. Constipation. Electronically Signed   By: Iven Finn M.D.   On: 04/08/2021 23:50    Dialysis Orders:  MWF at [facility] 3.5hr, 350/600, EDW 61.5kg, 2K/2.5Ca, TDC, heparin 5000 + 500/hr - No ESA, iron, VDRA - HBsAg negative on 10/5  Assessment/Plan:  R labial abscess/cellulitis: On Vanc/Ceftriaxone/metronidazole. Gen surg consult pending for I&D.  ESRD:  Continue HD per usual MWF schedule -> HD tomorrow.  Hypertension/volume: BP decent, no edema on exam. Continue OP EDW for now.  Anemia: Hgb 12.4 - no ESA needed.  Metabolic bone disease: Ca/Phos at goal, continue home binders.  Nutrition: Alb low, starting protein supplements.  T2DM: Per hospitalist.  Veneta Penton, PA-C 04/09/2021, 11:41 AM  [facility]

## 2021-04-10 ENCOUNTER — Inpatient Hospital Stay (HOSPITAL_COMMUNITY): Payer: Medicare Other | Admitting: Certified Registered Nurse Anesthetist

## 2021-04-10 ENCOUNTER — Encounter (HOSPITAL_COMMUNITY)
Admission: EM | Disposition: A | Discharge status: Home Health | Payer: Self-pay | Source: Home / Self Care | Attending: Internal Medicine

## 2021-04-10 ENCOUNTER — Encounter (HOSPITAL_COMMUNITY): Payer: Self-pay | Admitting: Internal Medicine

## 2021-04-10 DIAGNOSIS — E1122 Type 2 diabetes mellitus with diabetic chronic kidney disease: Secondary | ICD-10-CM | POA: Diagnosis not present

## 2021-04-10 DIAGNOSIS — N764 Abscess of vulva: Secondary | ICD-10-CM | POA: Diagnosis not present

## 2021-04-10 DIAGNOSIS — N186 End stage renal disease: Secondary | ICD-10-CM | POA: Diagnosis not present

## 2021-04-10 DIAGNOSIS — Z992 Dependence on renal dialysis: Secondary | ICD-10-CM | POA: Diagnosis not present

## 2021-04-10 HISTORY — PX: INCISION AND DRAINAGE ABSCESS: SHX5864

## 2021-04-10 LAB — CBC WITH DIFFERENTIAL/PLATELET
Abs Immature Granulocytes: 0.21 10*3/uL — ABNORMAL HIGH (ref 0.00–0.07)
Basophils Absolute: 0.1 10*3/uL (ref 0.0–0.1)
Basophils Relative: 1 %
Eosinophils Absolute: 0.3 10*3/uL (ref 0.0–0.5)
Eosinophils Relative: 1 %
HCT: 35.9 % — ABNORMAL LOW (ref 36.0–46.0)
Hemoglobin: 11.1 g/dL — ABNORMAL LOW (ref 12.0–15.0)
Immature Granulocytes: 1 %
Lymphocytes Relative: 16 %
Lymphs Abs: 4 10*3/uL (ref 0.7–4.0)
MCH: 26.2 pg (ref 26.0–34.0)
MCHC: 30.9 g/dL (ref 30.0–36.0)
MCV: 84.7 fL (ref 80.0–100.0)
Monocytes Absolute: 1.5 10*3/uL — ABNORMAL HIGH (ref 0.1–1.0)
Monocytes Relative: 6 %
Neutro Abs: 19.4 10*3/uL — ABNORMAL HIGH (ref 1.7–7.7)
Neutrophils Relative %: 75 %
Platelets: 238 10*3/uL (ref 150–400)
RBC: 4.24 MIL/uL (ref 3.87–5.11)
RDW: 17 % — ABNORMAL HIGH (ref 11.5–15.5)
WBC: 25.4 10*3/uL — ABNORMAL HIGH (ref 4.0–10.5)
nRBC: 0 % (ref 0.0–0.2)

## 2021-04-10 LAB — BASIC METABOLIC PANEL
Anion gap: 17 — ABNORMAL HIGH (ref 5–15)
BUN: 27 mg/dL — ABNORMAL HIGH (ref 6–20)
CO2: 24 mmol/L (ref 22–32)
Calcium: 8.7 mg/dL — ABNORMAL LOW (ref 8.9–10.3)
Chloride: 95 mmol/L — ABNORMAL LOW (ref 98–111)
Creatinine, Ser: 4.73 mg/dL — ABNORMAL HIGH (ref 0.44–1.00)
GFR, Estimated: 10 mL/min — ABNORMAL LOW (ref >60)
Glucose, Bld: 114 mg/dL — ABNORMAL HIGH (ref 70–99)
Potassium: 4.8 mmol/L (ref 3.5–5.1)
Sodium: 136 mmol/L (ref 135–145)

## 2021-04-10 LAB — HEPATITIS B SURFACE ANTIBODY,QUALITATIVE: Hep B S Ab: NONREACTIVE

## 2021-04-10 LAB — HEPATITIS B SURFACE ANTIGEN: Hepatitis B Surface Ag: NONREACTIVE

## 2021-04-10 LAB — GLUCOSE, CAPILLARY
Glucose-Capillary: 124 mg/dL — ABNORMAL HIGH (ref 70–99)
Glucose-Capillary: 142 mg/dL — ABNORMAL HIGH (ref 70–99)
Glucose-Capillary: 128 mg/dL — ABNORMAL HIGH (ref 70–99)
Glucose-Capillary: 202 mg/dL — ABNORMAL HIGH (ref 70–99)

## 2021-04-10 SURGERY — INCISION AND DRAINAGE, ABSCESS
Anesthesia: General | Site: "Vagina "

## 2021-04-10 MED ORDER — CHLORHEXIDINE GLUCONATE CLOTH 2 % EX PADS
6.0000 | MEDICATED_PAD | Freq: Every day | CUTANEOUS | Status: DC
  Administered 2021-04-10 – 2021-04-14 (×5): 6 via TOPICAL

## 2021-04-10 MED ORDER — HYDROMORPHONE HCL 1 MG/ML IJ SOLN
INTRAMUSCULAR | Status: AC
  Filled 2021-04-10: qty 1

## 2021-04-10 MED ORDER — FENTANYL CITRATE (PF) 250 MCG/5ML IJ SOLN
INTRAMUSCULAR | Status: DC | PRN
  Administered 2021-04-10: 50 ug via INTRAVENOUS

## 2021-04-10 MED ORDER — PHENYLEPHRINE HCL-NACL 20-0.9 MG/250ML-% IV SOLN
INTRAVENOUS | Status: DC | PRN
  Administered 2021-04-10: 20 ug/min via INTRAVENOUS

## 2021-04-10 MED ORDER — ALTEPLASE 2 MG IJ SOLR: 2.0000 mg | Freq: Once | INTRAMUSCULAR | Status: DC | PRN

## 2021-04-10 MED ORDER — SODIUM CHLORIDE 0.9 % IV SOLN: 100.0000 mL | INTRAVENOUS | Status: DC | PRN

## 2021-04-10 MED ORDER — HYDROMORPHONE HCL 1 MG/ML IJ SOLN
0.2500 mg | INTRAMUSCULAR | Status: DC | PRN
  Administered 2021-04-10: 0.5 mg via INTRAVENOUS
  Administered 2021-04-10: 0.25 mg via INTRAVENOUS

## 2021-04-10 MED ORDER — MIDAZOLAM HCL 2 MG/2ML IJ SOLN
INTRAMUSCULAR | Status: AC
  Filled 2021-04-10: qty 2

## 2021-04-10 MED ORDER — ONDANSETRON HCL 4 MG/2ML IJ SOLN
INTRAMUSCULAR | Status: DC | PRN
  Administered 2021-04-10: 4 mg via INTRAVENOUS

## 2021-04-10 MED ORDER — HYDRALAZINE HCL 20 MG/ML IJ SOLN
5.0000 mg | INTRAMUSCULAR | Status: DC | PRN
  Administered 2021-04-10: 5 mg via INTRAVENOUS
  Filled 2021-04-10: qty 1

## 2021-04-10 MED ORDER — DEXAMETHASONE SODIUM PHOSPHATE 10 MG/ML IJ SOLN
INTRAMUSCULAR | Status: DC | PRN
  Administered 2021-04-10: 5 mg via INTRAVENOUS

## 2021-04-10 MED ORDER — CHLORHEXIDINE GLUCONATE 0.12 % MT SOLN
OROMUCOSAL | Status: AC
  Filled 2021-04-10: qty 15

## 2021-04-10 MED ORDER — MIDAZOLAM HCL 2 MG/2ML IJ SOLN
INTRAMUSCULAR | Status: DC | PRN
Start: 2021-04-10 — End: 2021-04-10
  Administered 2021-04-10: 2 mg via INTRAVENOUS

## 2021-04-10 MED ORDER — ORAL CARE MOUTH RINSE: 15.0000 mL | Freq: Once | OROMUCOSAL | Status: AC

## 2021-04-10 MED ORDER — HEPARIN SODIUM (PORCINE) 1000 UNIT/ML DIALYSIS
1000.0000 [IU] | INTRAMUSCULAR | Status: DC | PRN
  Administered 2021-04-10: 1000 [IU] via INTRAVENOUS_CENTRAL
  Filled 2021-04-10: qty 1

## 2021-04-10 MED ORDER — PROPOFOL 10 MG/ML IV BOLUS
INTRAVENOUS | Status: AC
  Filled 2021-04-10: qty 20

## 2021-04-10 MED ORDER — SODIUM CHLORIDE 0.9 % IV SOLN: INTRAVENOUS | Status: DC

## 2021-04-10 MED ORDER — PROPOFOL 10 MG/ML IV BOLUS
INTRAVENOUS | Status: DC | PRN
  Administered 2021-04-10: 100 mg via INTRAVENOUS

## 2021-04-10 MED ORDER — CHLORHEXIDINE GLUCONATE 0.12 % MT SOLN
15.0000 mL | Freq: Once | OROMUCOSAL | Status: AC
  Administered 2021-04-10: 15 mL via OROMUCOSAL

## 2021-04-10 MED ORDER — LIDOCAINE 2% (20 MG/ML) 5 ML SYRINGE
INTRAMUSCULAR | Status: DC | PRN
  Administered 2021-04-10: 40 mg via INTRAVENOUS

## 2021-04-10 MED ORDER — 0.9 % SODIUM CHLORIDE (POUR BTL) OPTIME
TOPICAL | Status: DC | PRN
  Administered 2021-04-10: 1000 mL

## 2021-04-10 MED ORDER — HYDROMORPHONE HCL 1 MG/ML IJ SOLN
0.5000 mg | INTRAMUSCULAR | Status: DC | PRN
  Administered 2021-04-10 – 2021-04-13 (×6): 0.5 mg via INTRAVENOUS
  Filled 2021-04-10 (×6): qty 1

## 2021-04-10 MED ORDER — FENTANYL CITRATE (PF) 250 MCG/5ML IJ SOLN
INTRAMUSCULAR | Status: AC
  Filled 2021-04-10: qty 5

## 2021-04-10 MED ORDER — BUPIVACAINE-EPINEPHRINE (PF) 0.25% -1:200000 IJ SOLN
INTRAMUSCULAR | Status: AC
  Filled 2021-04-10: qty 30

## 2021-04-10 SURGICAL SUPPLY — 26 items
BAG COUNTER SPONGE SURGICOUNT (BAG) ×3 IMPLANT
BNDG GAUZE ELAST 4 BULKY (GAUZE/BANDAGES/DRESSINGS) ×2 IMPLANT
CANISTER SUCT 3000ML PPV (MISCELLANEOUS) ×3 IMPLANT
COVER SURGICAL LIGHT HANDLE (MISCELLANEOUS) ×3 IMPLANT
DRAPE LAPAROSCOPIC ABDOMINAL (DRAPES) IMPLANT
DRAPE LAPAROTOMY 100X72 PEDS (DRAPES) IMPLANT
DRSG PAD ABDOMINAL 8X10 ST (GAUZE/BANDAGES/DRESSINGS) ×2 IMPLANT
ELECT REM PT RETURN 9FT ADLT (ELECTROSURGICAL) ×2
ELECTRODE REM PT RTRN 9FT ADLT (ELECTROSURGICAL) ×2 IMPLANT
GAUZE SPONGE 4X4 12PLY STRL (GAUZE/BANDAGES/DRESSINGS) IMPLANT
GLOVE SURG ENC MOIS LTX SZ7.5 (GLOVE) ×3 IMPLANT
GOWN STRL REUS W/ TWL LRG LVL3 (GOWN DISPOSABLE) ×4 IMPLANT
GOWN STRL REUS W/TWL LRG LVL3 (GOWN DISPOSABLE) ×2
KIT BASIN OR (CUSTOM PROCEDURE TRAY) ×3 IMPLANT
KIT TURNOVER KIT B (KITS) ×3 IMPLANT
NDL HYPO 25GX1X1/2 BEV (NEEDLE) IMPLANT
NEEDLE HYPO 25GX1X1/2 BEV (NEEDLE) IMPLANT
NS IRRIG 1000ML POUR BTL (IV SOLUTION) ×3 IMPLANT
PACK GENERAL/GYN (CUSTOM PROCEDURE TRAY) ×3 IMPLANT
PAD ARMBOARD 7.5X6 YLW CONV (MISCELLANEOUS) ×3 IMPLANT
PENCIL SMOKE EVACUATOR (MISCELLANEOUS) ×3 IMPLANT
SWAB COLLECTION DEVICE MRSA (MISCELLANEOUS) IMPLANT
SWAB CULTURE ESWAB REG 1ML (MISCELLANEOUS) IMPLANT
SYR CONTROL 10ML LL (SYRINGE) IMPLANT
TOWEL GREEN STERILE (TOWEL DISPOSABLE) ×3 IMPLANT
TOWEL GREEN STERILE FF (TOWEL DISPOSABLE) ×3 IMPLANT

## 2021-04-10 NOTE — Progress Notes (Signed)
High venous pressure alarm. Clotting noted in venous chamber. Unable to return blood. Patient disconnected. Treatment paused.

## 2021-04-10 NOTE — Progress Notes (Signed)
Received call from OR requesting the amount of time before patient treatment complete. Call discussed with Stephania Fragmin, PA. Per PA, ok to not restart treatment, but administer antibiotic. Treatment not reinitiated.

## 2021-04-10 NOTE — Progress Notes (Signed)
Patient with issues achieving and maintaining blood flow rate during dialysis. Lines reversed.

## 2021-04-10 NOTE — Anesthesia Preprocedure Evaluation (Addendum)
Anesthesia Evaluation  Patient identified by MRN, date of birth, ID band Patient awake    Reviewed: Allergy & Precautions, NPO status , Patient's Chart, lab work & pertinent test results  Airway Mallampati: II  TM Distance: >3 FB Neck ROM: Full  Mouth opening: Limited Mouth Opening  Dental  (+) Teeth Intact, Dental Advisory Given   Pulmonary neg pulmonary ROS,    Pulmonary exam normal breath sounds clear to auscultation       Cardiovascular hypertension, Pt. on medications Normal cardiovascular exam Rhythm:Regular Rate:Normal  TTE 2021 1. Left ventricular ejection fraction, by estimation, is 60 to 65%. The  left ventricle has normal function. The left ventricle has no regional  wall motion abnormalities. There is moderate left ventricular hypertrophy.  Left ventricular diastolic  parameters are consistent with Grade II diastolic dysfunction  (pseudonormalization). Elevated left atrial pressure.  2. Right ventricular systolic function is normal. The right ventricular  size is normal. There is mildly elevated pulmonary artery systolic  pressure.  3. The mitral valve is normal in structure. Trivial mitral valve  regurgitation.  4. The aortic valve is tricuspid. Aortic valve regurgitation is not  visualized. Mild aortic valve sclerosis is present, with no evidence of  aortic valve stenosis.  5. The inferior vena cava is normal in size with <50% respiratory  variability, suggesting right atrial pressure of 8 mmHg.    Neuro/Psych  Headaches, negative psych ROS   GI/Hepatic Neg liver ROS, GERD  ,  Endo/Other  diabetes  Renal/GU Dialysis and ESRFRenal disease (dialysis MWF, had dialysis wed without complications)Lab Results      Component                Value               Date                      CREATININE               4.73 (H)            04/10/2021                BUN                      27 (H)              04/10/2021                 NA                       136                 04/10/2021                K                        4.8                 04/10/2021                CL                       95 (L)              04/10/2021                CO2  24                  04/10/2021             negative genitourinary   Musculoskeletal  (+) Arthritis ,   Abdominal   Peds  Hematology negative hematology ROS (+)   Anesthesia Other Findings   Reproductive/Obstetrics                           Anesthesia Physical Anesthesia Plan  ASA: 3  Anesthesia Plan: General   Post-op Pain Management:    Induction: Intravenous  PONV Risk Score and Plan: 3 and Ondansetron, Dexamethasone and Midazolam  Airway Management Planned: LMA  Additional Equipment:   Intra-op Plan:   Post-operative Plan: Extubation in OR  Informed Consent: I have reviewed the patients History and Physical, chart, labs and discussed the procedure including the risks, benefits and alternatives for the proposed anesthesia with the patient or authorized representative who has indicated his/her understanding and acceptance.     Dental advisory given  Plan Discussed with: CRNA  Anesthesia Plan Comments:         Anesthesia Quick Evaluation

## 2021-04-10 NOTE — Progress Notes (Signed)
Stephania Fragmin, physician's assistant notified of venous chamber clotting and inability to return blood.

## 2021-04-10 NOTE — Anesthesia Procedure Notes (Signed)
Procedure Name: LMA Insertion Date/Time: 04/10/2021 2:18 PM Performed by: Dorthea Cove, CRNA Pre-anesthesia Checklist: Patient identified, Emergency Drugs available, Suction available and Patient being monitored Patient Re-evaluated:Patient Re-evaluated prior to induction Oxygen Delivery Method: Circle System Utilized Preoxygenation: Pre-oxygenation with 100% oxygen Induction Type: IV induction Ventilation: Mask ventilation without difficulty LMA: LMA inserted LMA Size: 4.0 Number of attempts: 1 Airway Equipment and Method: Bite block Placement Confirmation: positive ETCO2 Tube secured with: Tape Dental Injury: Teeth and Oropharynx as per pre-operative assessment

## 2021-04-10 NOTE — Progress Notes (Signed)
PROGRESS NOTE    [name] [name]  [id number] DOB: 13-Feb-1966 DOA: 04/08/2021 PCP: Myrtis Hopping., MD    Chief Complaint  Patient presents with   Abscess    Brief Narrative:   [name] [name] is a 55 y.o. female with medical history significant for end-stage renal disease on hemodialysis (M,W,F), type 2 diabetes mellitus, who is admitted to [facility] on 04/08/2021 with suprapubic cellulitis after presenting from home to [facility] ED complaining of abdominal erythema.  Her work-up was significant for suprapubic cellulitis with labial abscess, GYN consulted, who requested general surgery UA showing, patient s/p I&D by general surgery 10/7.  Assessment & Plan:   Principal Problem:   Cellulitis of suprapubic region Active Problems:   ESRD (end stage renal disease) (HCC)   Leukocytosis   DM2 (diabetes mellitus, type 2) (HCC)  Right labial abscess/cellulitis -General surgery consult greatly appreciated, status post I&D, continue with vancomycin, Rocephin and Flagyl, and follow on intraoperative culture -Continue with pain control.  ESRD -Renal consult greatly appreciated, continue with hemodialysis on Monday Wednesday Friday schedule, she was dialyzed today   Hypertension -blood pressure is evaded, most likely pain contributing to it, she was started on Norvasc and Coreg overnight, I did add as needed hydralazine as well.  Type 2 diabetes mellitus -Follow on A1c -Continue with insulin sliding scale during hospital stay    DVT prophylaxis: SCD Code Status: Full Family Communication: None at bedside Disposition:   Status is: Inpatient  Remains inpatient appropriate because:IV treatments appropriate due to intensity of illness or inability to take PO  Dispo: The patient is from: Home              Anticipated d/c is to: Home              Patient currently is not medically stable to d/c.   Difficult to place patient No       Consultants:  Renal General  surgery  Procedures:  I&D of right labial abscess 10/7   Subjective: She complains of pain at surgical site Objective: Vitals:   04/10/21 1515 04/10/21 1530 04/10/21 1545 04/10/21 1600  BP: (!) 154/72 138/74 (!) 154/69 (!) 160/79  Pulse: 71 69 68 69  Resp: '17 12 10 15  '$ Temp:    98 F (36.7 C)  TempSrc:      SpO2: 98% 95% 100% 100%  Weight:      Height:        Intake/Output Summary (Last 24 hours) at 04/10/2021 1646 Last data filed at 04/10/2021 1447 Gross per 24 hour  Intake 400 ml  Output 25 ml  Net 375 ml   Filed Weights   04/09/21 1400 04/10/21 0500 04/10/21 0904  Weight: 61.2 kg 62.6 kg 60.1 kg    Examination:  Awake Alert, Oriented X 3, No new F.N deficits, Normal affect Symmetrical Chest wall movement, Good air movement bilaterally, CTAB RRR,No Gallops,Rubs or new Murmurs, No Parasternal Heave +ve B.Sounds, Abd Soft, No tenderness,  Genital exam deferred for now given she is postop  no Cyanosis, Clubbing or edema, No new Rash or bruise       Data Reviewed: I have personally reviewed following labs and imaging studies  CBC: Recent Labs  Lab 04/08/21 2200 04/09/21 0305 04/10/21 0229  WBC 24.5* 24.8* 25.4*  NEUTROABS 15.4* 16.6* 19.4*  HGB 12.8 12.4 11.1*  HCT 40.1 41.7 35.9*  MCV 84.2 88.9 84.7  PLT 381 300 [id number]    Basic Metabolic Panel:  Recent Labs  Lab 04/09/21 0200 04/09/21 0305 04/10/21 0229  NA 138 139 136  K 3.9 3.7 4.8  CL 100 99 95*  CO2 '25 26 24  '$ GLUCOSE 86 95 114*  BUN 16 15 27*  CREATININE 3.54* 3.59* 4.73*  CALCIUM 8.7* 8.8* 8.7*  MG  --  1.9  --   PHOS  --  3.5  --     GFR: Estimated Creatinine Clearance: 11 mL/min (A) (by C-G formula based on SCr of 4.73 mg/dL (H)).  Liver Function Tests: Recent Labs  Lab 04/09/21 0200 04/09/21 0305  AST 13* 12*  ALT 9 9  ALKPHOS 151* 126  BILITOT 0.6 0.6  PROT 7.1 7.2  ALBUMIN 2.8* 2.8*    CBG: Recent Labs  Lab 04/09/21 0838 04/09/21 1231 04/09/21 1820 04/10/21 0802  04/10/21 1341  GLUCAP 122* 144* 111* 124* 142*     Recent Results (from the past 240 hour(s))  Blood culture (routine x 2)     Status: None (Preliminary result)   Collection Time: 04/08/21 10:40 PM   Specimen: BLOOD  Result Value Ref Range Status   Specimen Description BLOOD BLOOD LEFT FOREARM  Final   Special Requests   Final    BOTTLES DRAWN AEROBIC AND ANAEROBIC Blood Culture results may not be optimal due to an inadequate volume of blood received in culture bottles   Culture   Final    NO GROWTH 1 DAY Performed at [facility], [place] [address], [place], [place] [address]    Report Status PENDING  Incomplete  Resp Panel by RT-PCR (Flu A&B, Covid) Nasopharyngeal Swab     Status: None   Collection Time: 04/09/21 12:08 AM   Specimen: Nasopharyngeal Swab; Nasopharyngeal(NP) swabs in vial transport medium  Result Value Ref Range Status   SARS Coronavirus 2 by RT PCR NEGATIVE NEGATIVE Final    Comment: (NOTE) SARS-CoV-2 target nucleic acids are NOT DETECTED.  The SARS-CoV-2 RNA is generally detectable in upper respiratory specimens during the acute phase of infection. The lowest concentration of SARS-CoV-2 viral copies this assay can detect is 138 copies/mL. A negative result does not preclude SARS-Cov-2 infection and should not be used as the sole basis for treatment or other patient management decisions. A negative result may occur with  improper specimen collection/handling, submission of specimen other than nasopharyngeal swab, presence of viral mutation(s) within the areas targeted by this assay, and inadequate number of viral copies(<138 copies/mL). A negative result must be combined with clinical observations, patient history, and epidemiological information. The expected result is Negative.  Fact Sheet for Patients:  EntrepreneurPulse.com.au  Fact Sheet for Healthcare Providers:  IncredibleEmployment.be  This test is no t yet  approved or cleared by the [place] FDA and  has been authorized for detection and/or diagnosis of SARS-CoV-2 by FDA under an Emergency Use Authorization (EUA). This EUA will remain  in effect (meaning this test can be used) for the duration of the COVID-19 declaration under Section 564(b)(1) of the Act, 21 U.S.C.section 360bbb-3(b)(1), unless the authorization is terminated  or revoked sooner.       Influenza A by PCR NEGATIVE NEGATIVE Final   Influenza B by PCR NEGATIVE NEGATIVE Final    Comment: (NOTE) The Xpert Xpress SARS-CoV-2/FLU/RSV plus assay is intended as an aid in the diagnosis of influenza from Nasopharyngeal swab specimens and should not be used as a sole basis for treatment. Nasal washings and aspirates are unacceptable for Xpert Xpress SARS-CoV-2/FLU/RSV testing.  Fact  Sheet for Patients: EntrepreneurPulse.com.au  Fact Sheet for Healthcare Providers: IncredibleEmployment.be  This test is not yet approved or cleared by the [place] FDA and has been authorized for detection and/or diagnosis of SARS-CoV-2 by FDA under an Emergency Use Authorization (EUA). This EUA will remain in effect (meaning this test can be used) for the duration of the COVID-19 declaration under Section 564(b)(1) of the Act, 21 U.S.C. section 360bbb-3(b)(1), unless the authorization is terminated or revoked.  Performed at [facility], [place] [address], [place], [place] [address]   Blood culture (routine x 2)     Status: None (Preliminary result)   Collection Time: 04/09/21 12:11 AM   Specimen: BLOOD LEFT WRIST  Result Value Ref Range Status   Specimen Description BLOOD LEFT WRIST  Final   Special Requests   Final    BOTTLES DRAWN AEROBIC AND ANAEROBIC Blood Culture results may not be optimal due to an inadequate volume of blood received in culture bottles   Culture   Final    NO GROWTH 1 DAY Performed at [facility], [place]  [address] [place], [place] [address]    Report Status PENDING  Incomplete  Aerobic/Anaerobic Culture w Gram Stain (surgical/deep wound)     Status: None (Preliminary result)   Collection Time: 04/10/21  2:38 PM   Specimen: PATH Other; Body Fluid  Result Value Ref Range Status   Specimen Description WOUND  Final   Special Requests   Final     RIGHT LABIAL ABCESS Performed at [facility] [address] [place], [place] [address]    Gram Stain PENDING  Incomplete   Culture PENDING  Incomplete   Report Status PENDING  Incomplete         Radiology Studies: CT ABDOMEN PELVIS WO CONTRAST  Result Date: 04/08/2021 CLINICAL DATA:  Abdominal pain, fever pubic swelling and labial swelling concerning for abscess/infection - dialyzed today will start with non-con EXAM: CT ABDOMEN AND PELVIS WITHOUT CONTRAST TECHNIQUE: Multidetector CT imaging of the abdomen and pelvis was performed following the standard protocol without IV contrast. COMPARISON:  None. FINDINGS: Lower chest: No acute abnormality. Linear atelectasis versus scarring. Hepatobiliary: No focal liver abnormality. Status post cholecystectomy. No biliary dilatation. Pancreas: No focal lesion. Normal pancreatic contour. No surrounding inflammatory changes. No main pancreatic ductal dilatation. Spleen: Normal in size without focal abnormality. Adrenals/Urinary Tract: No adrenal nodule bilaterally. No nephrolithiasis and no hydronephrosis. No definite contour-deforming renal mass. No ureterolithiasis or hydroureter. Marked urinary bladder wall thickening with associated perivesicular fat stranding. Stomach/Bowel: Stomach is within normal limits. No evidence of bowel wall thickening or dilatation. Stool throughout the colon. Appendix appears normal. Vascular/Lymphatic: No abdominal aorta or iliac aneurysm. At least moderate atherosclerotic plaque of the aorta and its branches. Prominent but nonenlarged pelvic lymph nodes. No abdominal, pelvic, or  inguinal lymphadenopathy. Reproductive: Status post hysterectomy. No adnexal masses. Other: No intraperitoneal free fluid. No intraperitoneal free gas. No organized fluid collection. Musculoskeletal: Tiny fat containing umbilical hernia. Mild subcutaneus soft tissue edema. Subcutaneus soft tissue fat stranding within the right labia majora with query of an approximately 2 cm underlying organized fluid collection versus phlegmon formation (6:37, 3:85, 7:63). No suspicious lytic or blastic osseous lesions. No acute displaced fracture. Surgical hardware within the right proximal femur. IMPRESSION: 1. Urinary bladder findings suggestive of infection. Correlate with urinalysis. 2. Subcutaneus soft tissue fat stranding within the right labia majora with query of an approximately 2 cm underlying organized fluid collection versus  phlegmon formation. Markedly limited evaluation on this noncontrast study. 3. Constipation. Electronically Signed   By: Iven Finn M.D.   On: 04/08/2021 23:50        Scheduled Meds:  (feeding supplement) PROSource Plus  30 mL Oral BID BM   amLODipine  10 mg Oral Daily   carvedilol  3.125 mg Oral BID WC   HYDROmorphone       insulin aspart  0-6 Units Subcutaneous TID WC   polyethylene glycol  17 g Oral Daily   Continuous Infusions:  piperacillin-tazobactam (ZOSYN)  IV 2.25 g (04/10/21 0758)   promethazine (PHENERGAN) injection (IM or IVPB) Stopped (04/09/21 2358)   vancomycin 750 mg (04/10/21 1148)     LOS: 1 day       Phillips Climes, MD [facility]   To contact the attending provider between 7A-7P or the covering provider during after hours 7P-7A, please log into the web site [email or web address] and access using universal [place] password for that web site. If you do not have the password, please call the hospital operator.  04/10/2021, 4:46 PM

## 2021-04-10 NOTE — Interval H&P Note (Signed)
History and Physical Interval Note:  04/10/2021 1:56 PM  [name] [name]  has presented today for surgery, with the diagnosis of LABIAL ABSCESS.  The various methods of treatment have been discussed with the patient and family. After consideration of risks, benefits and other options for treatment, the patient has consented to  Procedure(s): INCISION AND DRAINAGE ABSCESS LABIA (N/A) as a surgical intervention.  The patient's history has been reviewed, patient examined, no change in status, stable for surgery.  I have reviewed the patient's chart and labs.  Questions were answered to the patient's satisfaction.     [name]

## 2021-04-10 NOTE — Plan of Care (Signed)
  Problem: Education: Goal: Knowledge of General Education information will improve Description: Including pain rating scale, medication(s)/side effects and non-pharmacologic comfort measures Outcome: Progressing   Problem: Clinical Measurements: Goal: Respiratory complications will improve Outcome: Progressing Goal: Cardiovascular complication will be avoided Outcome: Progressing   Problem: Coping: Goal: Level of anxiety will decrease Outcome: Progressing   Problem: Pain Managment: Goal: General experience of comfort will improve Outcome: Progressing   Problem: Safety: Goal: Ability to remain free from injury will improve Outcome: Progressing   Problem: Clinical Measurements: Goal: Ability to maintain clinical measurements within normal limits will improve Outcome: Not Progressing Goal: Will remain free from infection Outcome: Not Progressing Goal: Diagnostic test results will improve Outcome: Not Progressing   Problem: Activity: Goal: Risk for activity intolerance will decrease Outcome: Not Progressing   Problem: Nutrition: Goal: Adequate nutrition will be maintained Outcome: Not Progressing   Problem: Elimination: Goal: Will not experience complications related to bowel motility Outcome: Not Progressing

## 2021-04-10 NOTE — Transfer of Care (Signed)
Immediate Anesthesia Transfer of Care Note  Patient: [name] [name]  Procedure(s) Performed: INCISION AND DRAINAGE ABSCESS LABIA (Vagina )  Patient Location: PACU  Anesthesia Type:General  Level of Consciousness: awake, oriented and drowsy  Airway & Oxygen Therapy: Patient Spontanous Breathing  Post-op Assessment: Report given to RN and Post -op Vital signs reviewed and stable  Post vital signs: Reviewed and stable  Last Vitals:  Vitals Value Taken Time  BP 138/69 04/10/21 1458  Temp    Pulse 69 04/10/21 1500  Resp 16 04/10/21 1500  SpO2 98 % 04/10/21 1500  Vitals shown include unvalidated device data.  Last Pain:  Vitals:   04/10/21 1335  TempSrc: Oral  PainSc: 10-Worst pain ever      Patients Stated Pain Goal: 0 [id number] [phone])  Complications: No notable events documented.

## 2021-04-10 NOTE — Progress Notes (Signed)
[facility] Progress Note  Subjective: seen on HD  Vitals:   04/10/21 1000 04/10/21 1030 04/10/21 1100 04/10/21 1130  BP: 118/65 112/61 (!) 110/58 127/68  Pulse: 63 63 65 67  Resp:      Temp:      TempSrc:      SpO2:      Weight:      Height:        Exam:  alert, nad   no jvd  Chest cta bilat  Cor reg no RG  Abd soft ntnd no ascites   Ext no LE edema   Alert, NF, ox3   TDC RIJ    OP HD: MWF [facility]  3.5h  350/600  61.5kg  2/2.5 bath  TDC  Heparin 5000 + 500u/hr  - no esa, Fe, VDRA  - HBsAg negative on 10/5   Assessment/ Plan: R labial abscess/cellulitis: On Vanc, Ceftriaxone and metronidazole. Gen surg consulted.  ESRD:  HD MWF. HD today.   Hypertension/volume: BP decent, no edema on exam. Continue OP EDW for now.  Anemia: Hgb 12.4 - no ESA needed.  Metabolic bone disease: Ca/Phos at goal, continue home binders.  Nutrition: Alb low, starting protein supplements.  T2DM: Per hospitalist.     Kelly Splinter 04/10/2021, 12:47 PM   Recent Labs  Lab 04/09/21 0305 04/10/21 0229  K 3.7 4.8  BUN 15 27*  CREATININE 3.59* 4.73*  CALCIUM 8.8* 8.7*  PHOS 3.5  --   HGB 12.4 11.1*   Inpatient medications:  (feeding supplement) PROSource Plus  30 mL Oral BID BM   amLODipine  10 mg Oral Daily   carvedilol  3.125 mg Oral BID WC   insulin aspart  0-6 Units Subcutaneous TID WC   polyethylene glycol  17 g Oral Daily    sodium chloride     sodium chloride     piperacillin-tazobactam (ZOSYN)  IV 2.25 g (04/10/21 0758)   promethazine (PHENERGAN) injection (IM or IVPB) Stopped (04/09/21 2358)   vancomycin 750 mg (04/10/21 1148)   sodium chloride, sodium chloride, alteplase, heparin, ibuprofen, morphine injection, naLOXone (NARCAN)  injection, oxyCODONE, promethazine **OR** promethazine (PHENERGAN) injection (IM or IVPB)

## 2021-04-10 NOTE — Op Note (Signed)
04/10/2021  2:48 PM  PATIENT:  [name] [name]  55 y.o. female  PRE-OPERATIVE DIAGNOSIS:  LABIAL ABSCESS  POST-OPERATIVE DIAGNOSIS:  LABIAL ABSCESS  PROCEDURE:  Procedure(s): INCISION AND DRAINAGE ABSCESS LABIA (N/A)  SURGEON:  Surgeon(s) and Role:    * Jovita Kussmaul, MD - Primary  PHYSICIAN ASSISTANT:   ASSISTANTS: none   ANESTHESIA:   general  EBL:  25 mL   BLOOD ADMINISTERED:none  DRAINS: none   LOCAL MEDICATIONS USED:  NONE  SPECIMEN:  No Specimen  DISPOSITION OF SPECIMEN:  N/A  COUNTS:  YES  TOURNIQUET:  * No tourniquets in log *  DICTATION: .Dragon Dictation  After informed consent was obtained the patient was brought to the operating room and placed in the supine position on the operating table.  After adequate induction of general anesthesia with the patient was moved in the lithotomy position and all pressure points were padded.  The perineal area was then prepped with Betadine and draped in usual sterile manner.  The patient had a large fluctuant area involving the right labia.  A vertically oriented incision was made with a 15 blade knife overlying the area of fluctuance.  The incision was carried through the skin and subcutaneous tissue sharply with the electrocautery.  A large abscess cavity was readily identified.  A large amount of pus was evacuated.  Cultures were obtained.  The wound was debrided sharply with the electrocautery to remove some of the dead tissue.  Hemostasis was then achieved using the Bovie electrocautery.  Once the wound was relatively clean and hemostatic it was then packed with Kerlix moistened with saline.  The wound did not track deeper or beyond the labia.  Dressings were then applied.  The patient tolerated the procedure well.  At the end of the case all needle sponge and instrument counts were correct.  The patient was then awakened and taken to recovery in stable condition.  PLAN OF CARE: Admit to inpatient   PATIENT DISPOSITION:   PACU - hemodynamically stable.   Delay start of Pharmacological VTE agent (>24hrs) due to surgical blood loss or risk of bleeding: no

## 2021-04-11 DIAGNOSIS — E1122 Type 2 diabetes mellitus with diabetic chronic kidney disease: Secondary | ICD-10-CM | POA: Diagnosis not present

## 2021-04-11 DIAGNOSIS — Z992 Dependence on renal dialysis: Secondary | ICD-10-CM | POA: Diagnosis not present

## 2021-04-11 DIAGNOSIS — N186 End stage renal disease: Secondary | ICD-10-CM | POA: Diagnosis not present

## 2021-04-11 DIAGNOSIS — N764 Abscess of vulva: Secondary | ICD-10-CM | POA: Diagnosis not present

## 2021-04-11 LAB — HEMOGLOBIN A1C
Hgb A1c MFr Bld: 6.1 % — ABNORMAL HIGH (ref 4.8–5.6)
Mean Plasma Glucose: 128.37 mg/dL

## 2021-04-11 LAB — CBC
HCT: 34.4 % — ABNORMAL LOW (ref 36.0–46.0)
Hemoglobin: 10.8 g/dL — ABNORMAL LOW (ref 12.0–15.0)
MCH: 26.8 pg (ref 26.0–34.0)
MCHC: 31.4 g/dL (ref 30.0–36.0)
MCV: 85.4 fL (ref 80.0–100.0)
Platelets: 310 10*3/uL (ref 150–400)
RBC: 4.03 MIL/uL (ref 3.87–5.11)
RDW: 17 % — ABNORMAL HIGH (ref 11.5–15.5)
WBC: 25.1 10*3/uL — ABNORMAL HIGH (ref 4.0–10.5)
nRBC: 0 % (ref 0.0–0.2)

## 2021-04-11 LAB — BASIC METABOLIC PANEL
Anion gap: 12 (ref 5–15)
BUN: 27 mg/dL — ABNORMAL HIGH (ref 6–20)
CO2: 26 mmol/L (ref 22–32)
Calcium: 8.3 mg/dL — ABNORMAL LOW (ref 8.9–10.3)
Chloride: 94 mmol/L — ABNORMAL LOW (ref 98–111)
Creatinine, Ser: 4.39 mg/dL — ABNORMAL HIGH (ref 0.44–1.00)
GFR, Estimated: 11 mL/min — ABNORMAL LOW (ref >60)
Glucose, Bld: 350 mg/dL — ABNORMAL HIGH (ref 70–99)
Potassium: 4.2 mmol/L (ref 3.5–5.1)
Sodium: 132 mmol/L — ABNORMAL LOW (ref 135–145)

## 2021-04-11 LAB — GLUCOSE, CAPILLARY
Glucose-Capillary: 352 mg/dL — ABNORMAL HIGH (ref 70–99)
Glucose-Capillary: 272 mg/dL — ABNORMAL HIGH (ref 70–99)
Glucose-Capillary: 231 mg/dL — ABNORMAL HIGH (ref 70–99)
Glucose-Capillary: 193 mg/dL — ABNORMAL HIGH (ref 70–99)

## 2021-04-11 LAB — HEPATITIS B SURFACE ANTIBODY, QUANTITATIVE: Hep B S AB Quant (Post): <3.1 m[IU]/mL — ABNORMAL LOW (ref >9.9)

## 2021-04-11 MED ORDER — HYDRALAZINE HCL 25 MG PO TABS
25.0000 mg | ORAL_TABLET | Freq: Three times a day (TID) | ORAL | Status: DC
  Administered 2021-04-11 – 2021-04-12 (×3): 25 mg via ORAL
  Filled 2021-04-11 (×3): qty 1

## 2021-04-11 MED ORDER — HEPARIN SODIUM (PORCINE) 5000 UNIT/ML IJ SOLN
5000.0000 [IU] | Freq: Three times a day (TID) | INTRAMUSCULAR | Status: DC
  Administered 2021-04-11 – 2021-04-14 (×9): 5000 [IU] via SUBCUTANEOUS
  Filled 2021-04-11 (×9): qty 1

## 2021-04-11 MED ORDER — INSULIN GLARGINE-YFGN 100 UNIT/ML ~~LOC~~ SOLN
8.0000 [IU] | Freq: Every day | SUBCUTANEOUS | Status: DC
  Administered 2021-04-11 – 2021-04-14 (×4): 8 [IU] via SUBCUTANEOUS
  Filled 2021-04-11 (×4): qty 0.08

## 2021-04-11 NOTE — Progress Notes (Signed)
Pharmacy notified to have a pharm tech come and update pt's home meds with her.  The list in current chart in not up to date.  Pharmacy states they will notify [facility] pharm tech to come see pt for update.

## 2021-04-11 NOTE — Progress Notes (Signed)
PROGRESS NOTE    [name] [name]  [id number] DOB: Sep 18, 1965 DOA: 04/08/2021 PCP: [name] [name]., MD    Chief Complaint  Patient presents with   Abscess    Brief Narrative:   [name] [name] is a 55 y.o. female with medical history significant for end-stage renal disease on hemodialysis (M,W,F), type 2 diabetes mellitus, who is admitted to [facility] on 04/08/2021 with suprapubic cellulitis after presenting from home to [facility] ED complaining of abdominal erythema.  Her work-up was significant for suprapubic cellulitis with labial abscess, GYN consulted, who requested general surgery UA showing, patient s/p I&D by general surgery 10/7.  Assessment & Plan:   Principal Problem:   Cellulitis of suprapubic region Active Problems:   ESRD (end stage renal disease) (HCC)   Leukocytosis   DM2 (diabetes mellitus, type 2) (HCC)  Right labial abscess/cellulitis -General surgery consult greatly appreciated, status post I&D, continue with vancomycin, Rocephin and Flagyl, and follow on intraoperative culture -Continue with pain control. 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 -Blood cultures remains negative, continue to monitor  ESRD -Renal consult greatly appreciated, HD per renal.   Hypertension -blood pressure is elevated, most likely pain contributing to it, she was started on Norvasc and Coreg during hospital stay, I did add as needed hydralazine as well.  Remains uncontrolled, so I did start on low-dose hydralazine.  Type 2 diabetes mellitus -Follow on A1c -Continue with insulin sliding scale during hospital stay -BG significantly elevated so started on low-dose Lantus.    DVT prophylaxis: SCD, [place] heparin Code Status: Full Family Communication: None at bedside Disposition:   Status is: Inpatient  Remains inpatient appropriate because:IV treatments appropriate due to intensity of  illness or inability to take PO  Dispo: The patient is from: Home              Anticipated d/c is to: Home              Patient currently is not medically stable to d/c.   Difficult to place patient No       Consultants:  Renal General surgery  Procedures:  I&D of right labial abscess 10/7   Subjective:  No significant events overnight, still complaining of pain at surgical site.  Objective: Vitals:   04/10/21 1800 04/10/21 2215 04/11/21 0245 04/11/21 0950  BP: (!) 186/69 (!) 152/61  (!) 170/64  Pulse:    66  Resp: '12 19  18  '$ Temp: 97.9 F (36.6 C) 97.6 F (36.4 C)  98.5 F (36.9 C)  TempSrc: Axillary Axillary  Oral  SpO2:    99%  Weight:   60.3 kg   Height:        Intake/Output Summary (Last 24 hours) at 04/11/2021 1104 Last data filed at 04/11/2021 0556 Gross per 24 hour  Intake 690.5 ml  Output 754 ml  Net -63.5 ml   Filed Weights   04/10/21 0904 04/10/21 1204 04/11/21 0245  Weight: 60.1 kg 60.3 kg 60.3 kg    Examination:  Awake Alert, Oriented X 3, No new F.N deficits, Normal affect Symmetrical Chest wall movement, Good air movement bilaterally, CTAB RRR,No Gallops,Rubs or new Murmurs, No Parasternal Heave +ve B.Sounds, Abd Soft, No tenderness, No rebound - guarding or rigidity. No Cyanosis, Clubbing or edema, No new Rash or bruise     Data Reviewed: I have personally reviewed following labs and imaging studies  CBC: Recent Labs  Lab 04/08/21 2200 04/09/21 0305 04/10/21 0229 04/11/21 0836  WBC 24.5* 24.8* 25.4* 25.1*  NEUTROABS 15.4* 16.6* 19.4*  --   HGB 12.8 12.4 11.1* 10.8*  HCT 40.1 41.7 35.9* 34.4*  MCV 84.2 88.9 84.7 85.4  PLT 381 300 238 [id number]    Basic Metabolic Panel: Recent Labs  Lab 04/09/21 0200 04/09/21 0305 04/10/21 0229 04/11/21 0836  NA 138 139 136 132*  K 3.9 3.7 4.8 4.2  CL 100 99 95* 94*  CO2 '25 26 24 26  '$ GLUCOSE 86 95 114* 350*  BUN 16 15 27* 27*  CREATININE 3.54* 3.59* 4.73* 4.39*  CALCIUM 8.7* 8.8* 8.7*  8.3*  MG  --  1.9  --   --   PHOS  --  3.5  --   --     GFR: Estimated Creatinine Clearance: 11.9 mL/min (A) (by C-G formula based on SCr of 4.39 mg/dL (H)).  Liver Function Tests: Recent Labs  Lab 04/09/21 0200 04/09/21 0305  AST 13* 12*  ALT 9 9  ALKPHOS 151* 126  BILITOT 0.6 0.6  PROT 7.1 7.2  ALBUMIN 2.8* 2.8*    CBG: Recent Labs  Lab 04/10/21 0802 04/10/21 1341 04/10/21 1704 04/10/21 2226 04/11/21 0951  GLUCAP 124* 142* 128* 202* 352*     Recent Results (from the past 240 hour(s))  Blood culture (routine x 2)     Status: None (Preliminary result)   Collection Time: 04/08/21 10:40 PM   Specimen: BLOOD  Result Value Ref Range Status   Specimen Description BLOOD BLOOD LEFT FOREARM  Final   Special Requests   Final    BOTTLES DRAWN AEROBIC AND ANAEROBIC Blood Culture results may not be optimal due to an inadequate volume of blood received in culture bottles   Culture   Final    NO GROWTH 1 DAY Performed at [facility], [place] [address], [place], [place] [address]    Report Status PENDING  Incomplete  Resp Panel by RT-PCR (Flu A&B, Covid) Nasopharyngeal Swab     Status: None   Collection Time: 04/09/21 12:08 AM   Specimen: Nasopharyngeal Swab; Nasopharyngeal(NP) swabs in vial transport medium  Result Value Ref Range Status   SARS Coronavirus 2 by RT PCR NEGATIVE NEGATIVE Final    Comment: (NOTE) SARS-CoV-2 target nucleic acids are NOT DETECTED.  The SARS-CoV-2 RNA is generally detectable in upper respiratory specimens during the acute phase of infection. The lowest concentration of SARS-CoV-2 viral copies this assay can detect is 138 copies/mL. A negative result does not preclude SARS-Cov-2 infection and should not be used as the sole basis for treatment or other patient management decisions. A negative result may occur with  improper specimen collection/handling, submission of specimen other than nasopharyngeal swab, presence of viral mutation(s)  within the areas targeted by this assay, and inadequate number of viral copies(<138 copies/mL). A negative result must be combined with clinical observations, patient history, and epidemiological information. The expected result is Negative.  Fact Sheet for Patients:  EntrepreneurPulse.com.au  Fact Sheet for Healthcare Providers:  IncredibleEmployment.be  This test is no t yet approved or cleared by the [place] FDA and  has been authorized for detection and/or diagnosis of SARS-CoV-2 by FDA under an Emergency Use Authorization (EUA). This EUA will remain  in effect (meaning this test can be used) for the duration of the COVID-19 declaration under Section 564(b)(1) of the Act, 21 U.S.C.section 360bbb-3(b)(1), unless the authorization is terminated  or revoked sooner.  Influenza A by PCR NEGATIVE NEGATIVE Final   Influenza B by PCR NEGATIVE NEGATIVE Final    Comment: (NOTE) The Xpert Xpress SARS-CoV-2/FLU/RSV plus assay is intended as an aid in the diagnosis of influenza from Nasopharyngeal swab specimens and should not be used as a sole basis for treatment. Nasal washings and aspirates are unacceptable for Xpert Xpress SARS-CoV-2/FLU/RSV testing.  Fact Sheet for Patients: EntrepreneurPulse.com.au  Fact Sheet for Healthcare Providers: IncredibleEmployment.be  This test is not yet approved or cleared by the [place] FDA and has been authorized for detection and/or diagnosis of SARS-CoV-2 by FDA under an Emergency Use Authorization (EUA). This EUA will remain in effect (meaning this test can be used) for the duration of the COVID-19 declaration under Section 564(b)(1) of the Act, 21 U.S.C. section 360bbb-3(b)(1), unless the authorization is terminated or revoked.  Performed at [facility], [place] [address], [place], [place] [address]   Blood culture (routine x 2)     Status: None  (Preliminary result)   Collection Time: 04/09/21 12:11 AM   Specimen: BLOOD LEFT WRIST  Result Value Ref Range Status   Specimen Description BLOOD LEFT WRIST  Final   Special Requests   Final    BOTTLES DRAWN AEROBIC AND ANAEROBIC Blood Culture results may not be optimal due to an inadequate volume of blood received in culture bottles   Culture   Final    NO GROWTH 1 DAY Performed at [facility], [place] [address] [place], [place] [address]    Report Status PENDING  Incomplete  Aerobic/Anaerobic Culture w Gram Stain (surgical/deep wound)     Status: None (Preliminary result)   Collection Time: 04/10/21  2:38 PM   Specimen: PATH Other; Body Fluid  Result Value Ref Range Status   Specimen Description WOUND  Final   Special Requests  RIGHT LABIAL ABCESS  Final   Gram Stain   Final    ABUNDANT WBC PRESENT,BOTH PMN AND MONONUCLEAR ABUNDANT GRAM POSITIVE COCCI MODERATE GRAM NEGATIVE RODS    Culture   Final    CULTURE REINCUBATED FOR BETTER GROWTH Performed at [facility], [place] [address] [place], [place] [address]    Report Status PENDING  Incomplete         Radiology Studies: No results found.      Scheduled Meds:  (feeding supplement) PROSource Plus  30 mL Oral BID BM   amLODipine  10 mg Oral Daily   carvedilol  3.125 mg Oral BID WC   Chlorhexidine Gluconate Cloth  6 each Topical Daily   heparin injection (subcutaneous)  5,000 Units Subcutaneous Q8H   insulin aspart  0-6 Units Subcutaneous TID WC   polyethylene glycol  17 g Oral Daily   Continuous Infusions:  piperacillin-tazobactam (ZOSYN)  IV 2.25 g (04/11/21 0556)   promethazine (PHENERGAN) injection (IM or IVPB) Stopped (04/09/21 2358)   vancomycin Stopped (04/10/21 1248)     LOS: 2 days       Phillips Climes, MD [facility]   To contact the attending provider between 7A-7P or the covering provider during after hours 7P-7A, please log into the web site [email or web address] and access  using universal  password for that web site. If you do not have the password, please call the hospital operator.  04/11/2021, 11:04 AM

## 2021-04-11 NOTE — Progress Notes (Signed)
[facility] Progress Note  Subjective: seen in room, no c/o  Vitals:   04/10/21 1800 04/10/21 2215 04/11/21 0245 04/11/21 0950  BP: (!) 186/69 (!) 152/61  (!) 170/64  Pulse:    66  Resp: '12 19  18  '$ Temp: 97.9 F (36.6 C) 97.6 F (36.4 C)  98.5 F (36.9 C)  TempSrc: Axillary Axillary  Oral  SpO2:    99%  Weight:   60.3 kg   Height:        Exam:  alert, nad   no jvd  Chest cta bilat  Cor reg no RG  Abd soft ntnd no ascites   Ext no LE edema   Alert, NF, ox3   TDC RIJ    OP HD: MWF [facility]  3.5h  350/600  61.5kg  2/2.5 bath  TDC  Heparin 5000 + 500u/hr  - no esa, Fe, VDRA  - HBsAg negative on 10/5   Assessment/ Plan: R labial abscess/cellulitis: On Vanc, Ceftriaxone and metronidazole. Gen surg consulted.  ESRD:  HD MWF. Next HD Monday.    Hypertension/volume: BP decent, no edema on exam. At dry wt.   Anemia: Hgb 12.4 - no ESA needed.  Metabolic bone disease: Ca/Phos at goal, continue home binders.  Nutrition: Alb low, starting protein supplements.  T2DM: Per hospitalist.     Kelly Splinter 04/11/2021, 10:59 AM   Recent Labs  Lab 04/09/21 0305 04/10/21 0229 04/11/21 0836  K 3.7 4.8 4.2  BUN 15 27* 27*  CREATININE 3.59* 4.73* 4.39*  CALCIUM 8.8* 8.7* 8.3*  PHOS 3.5  --   --   HGB 12.4 11.1* 10.8*    Inpatient medications:  (feeding supplement) PROSource Plus  30 mL Oral BID BM   amLODipine  10 mg Oral Daily   carvedilol  3.125 mg Oral BID WC   Chlorhexidine Gluconate Cloth  6 each Topical Daily   heparin injection (subcutaneous)  5,000 Units Subcutaneous Q8H   insulin aspart  0-6 Units Subcutaneous TID WC   polyethylene glycol  17 g Oral Daily    piperacillin-tazobactam (ZOSYN)  IV 2.25 g (04/11/21 0556)   promethazine (PHENERGAN) injection (IM or IVPB) Stopped (04/09/21 2358)   vancomycin Stopped (04/10/21 1248)   hydrALAZINE, HYDROmorphone (DILAUDID) injection, naLOXone (NARCAN)  injection, oxyCODONE, promethazine **OR**  promethazine (PHENERGAN) injection (IM or IVPB)

## 2021-04-11 NOTE — Progress Notes (Signed)
Progress Note  1 Day Post-Op  Subjective: Patient reports some pain in labia but is grateful that I&D was performed since she has been dealing with this for several weeks. Tolerated dressing change well.   Objective: Vital signs in last 24 hours: Temp:  [97.6 F (36.4 C)-99.5 F (37.5 C)] 98.5 F (36.9 C) (10/08 0950) Pulse Rate:  [63-78] 66 (10/08 0950) Resp:  [10-19] 18 (10/08 0950) BP: (110-214)/(51-79) 170/64 (10/08 0950) SpO2:  [93 %-100 %] 99 % (10/08 0950) Weight:  [60.3 kg] 60.3 kg (10/08 0245) Last BM Date: 04/08/21  Intake/Output from previous day: 10/07 0701 - 10/08 0700 In: 690.5 [P.O.:240; I.V.:300; IV Piggyback:150.5] Out: 754 [Blood:25] Intake/Output this shift: No intake/output data recorded.  PE: General: pleasant, WD, WN female who is laying in bed in NAD Heart: regular, rate, and rhythm.   Lungs: Respiratory effort nonlabored Abd: soft, NT, ND GU: labial wound noted below with some fibrinous exudate in wound, no purulent drainage, wound measures ~ 7 cm x 1.5 cm x 2.5 cm  Psych: A&Ox3 with an appropriate affect.    Lab Results:  Recent Labs    04/10/21 0229 04/11/21 0836  WBC 25.4* 25.1*  HGB 11.1* 10.8*  HCT 35.9* 34.4*  PLT 238 310   BMET Recent Labs    04/10/21 0229 04/11/21 0836  NA 136 132*  K 4.8 4.2  CL 95* 94*  CO2 24 26  GLUCOSE 114* 350*  BUN 27* 27*  CREATININE 4.73* 4.39*  CALCIUM 8.7* 8.3*   PT/INR No results for input(s): LABPROT, INR in the last 72 hours. CMP     Component Value Date/Time   NA 132 (L) 04/11/2021 0836   K 4.2 04/11/2021 0836   CL 94 (L) 04/11/2021 0836   CO2 26 04/11/2021 0836   GLUCOSE 350 (H) 04/11/2021 0836   BUN 27 (H) 04/11/2021 0836   CREATININE 4.39 (H) 04/11/2021 0836   CREATININE 0.68 10/04/2013 1418   CALCIUM 8.3 (L) 04/11/2021 0836   PROT 7.2 04/09/2021 0305   ALBUMIN 2.8 (L) 04/09/2021 0305   AST 12 (L) 04/09/2021 0305   ALT 9 04/09/2021 0305   ALKPHOS 126 04/09/2021 0305    BILITOT 0.6 04/09/2021 0305   GFRNONAA 11 (L) 04/11/2021 0836   GFRNONAA >89 10/04/2013 1418   GFRAA 10 (L) 03/14/2020 0628   GFRAA >89 10/04/2013 1418   Lipase     Component Value Date/Time   LIPASE 25 03/12/2020 0557       Studies/Results: No results found.  Anti-infectives: Anti-infectives (From admission, onward)    Start     Dose/Rate Route Frequency Ordered Stop   04/10/21 1200  vancomycin (VANCOCIN) IVPB 750 mg/150 ml premix        750 mg 150 mL/hr over 60 Minutes Intravenous Every M-W-F (Hemodialysis) 04/09/21 0127     04/09/21 2200  cefTRIAXone (ROCEPHIN) 1 g in sodium chloride 0.9 % 100 mL IVPB  Status:  Discontinued        1 g 200 mL/hr over 30 Minutes Intravenous Every 24 hours 04/09/21 0101 04/09/21 1400   04/09/21 1430  piperacillin-tazobactam (ZOSYN) IVPB 2.25 g        2.25 g 100 mL/hr over 30 Minutes Intravenous Every 8 hours 04/09/21 1400     04/09/21 1100  metroNIDAZOLE (FLAGYL) IVPB 500 mg  Status:  Discontinued        500 mg 100 mL/hr over 60 Minutes Intravenous Every 8 hours 04/09/21 1040 04/09/21 1400  04/09/21 0045  vancomycin (VANCOREADY) IVPB 1250 mg/250 mL        1,250 mg 166.7 mL/hr over 90 Minutes Intravenous  Once 04/09/21 0033 04/09/21 0505   04/09/21 0015  cefTRIAXone (ROCEPHIN) 1 g in sodium chloride 0.9 % 100 mL IVPB        1 g 200 mL/hr over 30 Minutes Intravenous  Once 04/09/21 0007 04/09/21 0142        Assessment/Plan Right labial abscess S/P I&D 10/7 - BID dressing changes - WBC 25, cx with GPC/GNR - continue IV abx - ok to mobilize as tolerated - recommend GYN follow up      FEN - renal CM diet  VTE - SQH ID - vanc/zosyn   ESRD on HD DM HTN HLD Chronic vaginitis (per care everywhere)  LOS: 2 days    Norm Parcel, [facility] Surgery 04/11/2021, 10:10 AM Please see Amion for pager number during day hours 7:00am-4:30pm

## 2021-04-12 DIAGNOSIS — N764 Abscess of vulva: Secondary | ICD-10-CM | POA: Diagnosis not present

## 2021-04-12 DIAGNOSIS — Z992 Dependence on renal dialysis: Secondary | ICD-10-CM | POA: Diagnosis not present

## 2021-04-12 DIAGNOSIS — N186 End stage renal disease: Secondary | ICD-10-CM | POA: Diagnosis not present

## 2021-04-12 DIAGNOSIS — E1122 Type 2 diabetes mellitus with diabetic chronic kidney disease: Secondary | ICD-10-CM | POA: Diagnosis not present

## 2021-04-12 LAB — BASIC METABOLIC PANEL
Anion gap: 15 (ref 5–15)
BUN: 40 mg/dL — ABNORMAL HIGH (ref 6–20)
CO2: 26 mmol/L (ref 22–32)
Calcium: 8.1 mg/dL — ABNORMAL LOW (ref 8.9–10.3)
Chloride: 93 mmol/L — ABNORMAL LOW (ref 98–111)
Creatinine, Ser: 5.07 mg/dL — ABNORMAL HIGH (ref 0.44–1.00)
GFR, Estimated: 10 mL/min — ABNORMAL LOW (ref >60)
Glucose, Bld: 223 mg/dL — ABNORMAL HIGH (ref 70–99)
Potassium: 4.4 mmol/L (ref 3.5–5.1)
Sodium: 134 mmol/L — ABNORMAL LOW (ref 135–145)

## 2021-04-12 LAB — CBC
HCT: 33.2 % — ABNORMAL LOW (ref 36.0–46.0)
Hemoglobin: 10.5 g/dL — ABNORMAL LOW (ref 12.0–15.0)
MCH: 26.9 pg (ref 26.0–34.0)
MCHC: 31.6 g/dL (ref 30.0–36.0)
MCV: 84.9 fL (ref 80.0–100.0)
Platelets: 289 10*3/uL (ref 150–400)
RBC: 3.91 MIL/uL (ref 3.87–5.11)
RDW: 17 % — ABNORMAL HIGH (ref 11.5–15.5)
WBC: 17.8 10*3/uL — ABNORMAL HIGH (ref 4.0–10.5)
nRBC: 0 % (ref 0.0–0.2)

## 2021-04-12 LAB — GLUCOSE, CAPILLARY
Glucose-Capillary: 179 mg/dL — ABNORMAL HIGH (ref 70–99)
Glucose-Capillary: 121 mg/dL — ABNORMAL HIGH (ref 70–99)
Glucose-Capillary: 122 mg/dL — ABNORMAL HIGH (ref 70–99)
Glucose-Capillary: 111 mg/dL — ABNORMAL HIGH (ref 70–99)

## 2021-04-12 MED ORDER — HYDRALAZINE HCL 50 MG PO TABS
75.0000 mg | ORAL_TABLET | Freq: Three times a day (TID) | ORAL | Status: DC
  Administered 2021-04-12 – 2021-04-13 (×4): 75 mg via ORAL
  Filled 2021-04-12 (×4): qty 1

## 2021-04-12 MED ORDER — DIPHENHYDRAMINE HCL 25 MG PO CAPS
50.0000 mg | ORAL_CAPSULE | Freq: Four times a day (QID) | ORAL | Status: DC | PRN
  Administered 2021-04-12 – 2021-04-13 (×3): 50 mg via ORAL
  Filled 2021-04-12 (×3): qty 2

## 2021-04-12 MED ORDER — BISACODYL 5 MG PO TBEC
10.0000 mg | DELAYED_RELEASE_TABLET | Freq: Once | ORAL | Status: AC
  Administered 2021-04-12: 10 mg via ORAL
  Filled 2021-04-12: qty 2

## 2021-04-12 NOTE — Progress Notes (Signed)
[facility] Progress Note  Subjective: seen in room, no c/o  Vitals:   04/12/21 0005 04/12/21 0425 04/12/21 0500 04/12/21 0810  BP: (!) 145/72 (!) 188/85  (!) 179/70  Pulse:  (!) 58    Resp: '16 14  12  '$ Temp: (!) 97.4 F (36.3 C) (!) 97.5 F (36.4 C)  97.6 F (36.4 C)  TempSrc: Oral Oral  Oral  SpO2: 100%     Weight:   64.5 kg   Height:        Exam:  alert, nad   no jvd  Chest cta bilat  Cor reg no RG  Abd soft ntnd no ascites   Ext no LE edema   Alert, NF, ox3   TDC RIJ    OP HD: MWF [facility]  3.5h  350/600  61.5kg  2/2.5 bath  TDC  Heparin 5000 + 500u/hr  - no esa, Fe, VDRA  - HBsAg negative on 10/5   Assessment/ Plan: R labial abscess/cellulitis: on IV abx per pmd. Sp I&D by gen surg on 10/7  ESRD:  HD MWF. Next HD Monday.    Hypertension/volume: BP decent, no edema on exam. At dry wt.   Anemia: Hgb 10-12- no ESA needed.  Metabolic bone disease: Ca/Phos at goal, continue home binders.  Nutrition: Alb low, starting protein supplements.  T2DM: Per hospitalist.     [name] [name] 04/12/2021, 11:40 AM   Recent Labs  Lab 04/09/21 0305 04/10/21 0229 04/11/21 0836 04/12/21 0214  K 3.7   < > 4.2 4.4  BUN 15   < > 27* 40*  CREATININE 3.59*   < > 4.39* 5.07*  CALCIUM 8.8*   < > 8.3* 8.1*  PHOS 3.5  --   --   --   HGB 12.4   < > 10.8* 10.5*   < > = values in this interval not displayed.    Inpatient medications:  (feeding supplement) PROSource Plus  30 mL Oral BID BM   amLODipine  10 mg Oral Daily   Chlorhexidine Gluconate Cloth  6 each Topical Daily   heparin injection (subcutaneous)  5,000 Units Subcutaneous Q8H   hydrALAZINE  75 mg Oral Q8H   insulin aspart  0-6 Units Subcutaneous TID WC   insulin glargine-yfgn  8 Units Subcutaneous Daily   polyethylene glycol  17 g Oral Daily    piperacillin-tazobactam (ZOSYN)  IV 2.25 g (04/12/21 0545)   promethazine (PHENERGAN) injection (IM or IVPB) Stopped (04/09/21 2358)   vancomycin Stopped  (04/10/21 1248)   diphenhydrAMINE, hydrALAZINE, HYDROmorphone (DILAUDID) injection, naLOXone (NARCAN)  injection, oxyCODONE, promethazine **OR** promethazine (PHENERGAN) injection (IM or IVPB)

## 2021-04-12 NOTE — Plan of Care (Signed)
  Problem: Coping: Goal: Level of anxiety will decrease Outcome: Progressing   Problem: Pain Managment: Goal: General experience of comfort will improve Outcome: Progressing   Problem: Safety: Goal: Ability to remain free from injury will improve Outcome: Progressing   Problem: Skin Integrity: Goal: Risk for impaired skin integrity will decrease Outcome: Progressing   Problem: Skin Integrity: Goal: Skin integrity will improve Outcome: Progressing

## 2021-04-12 NOTE — Progress Notes (Addendum)
PROGRESS NOTE    [name] [name]  [id number] DOB: 02-12-1966 DOA: 04/08/2021 PCP: Myrtis Hopping., MD    Chief Complaint  Patient presents with   Abscess    Brief Narrative:   [name] [name] is a 55 y.o. female with medical history significant for end-stage renal disease on hemodialysis (M,W,F), type 2 diabetes mellitus, who is admitted to [facility] on 04/08/2021 with suprapubic cellulitis after presenting from home to [facility] ED complaining of abdominal erythema.  Her work-up was significant for suprapubic cellulitis with labial abscess, GYN consulted, who requested general surgery UA showing, patient s/p I&D by general surgery 10/7.  Assessment & Plan:   Principal Problem:   Cellulitis of suprapubic region Active Problems:   ESRD (end stage renal disease) (HCC)   Leukocytosis   DM2 (diabetes mellitus, type 2) (HCC)  Right labial abscess/cellulitis -General surgery consult greatly appreciated, status post I&D, initially treated with vancomycin, Rocephin and Flagyl>> zosyn, she is currently on Zosyn, operative culture growing Proteus and Streptococcus, follow-up sensitivity and narrow as needed.   -Continue with pain control. -Blood cultures remains negative, continue to monitor -Leukocytosis trending down.  ESRD -Renal consult greatly appreciated, HD per renal.  Hemodialysis is tomorrow  Hypertension -Blood pressure remains elevated, started on Norvasc, increased hydralazine to 75 mg oral 3 times daily.     Type 2 diabetes mellitus -Follow on A1c -Continue with insulin sliding scale during hospital stay -BG significantly elevated so started on low-dose Lantus.    DVT prophylaxis: SCD, [place] heparin Code Status: Full Family Communication: None at bedside Disposition:   Status is: Inpatient  Remains inpatient appropriate because:IV treatments appropriate due to intensity of illness or inability to take PO  Dispo: The patient is from: Home               Anticipated d/c is to: Home              Patient currently is not medically stable to d/c.   Difficult to place patient No       Consultants:  Renal General surgery  Procedures:  I&D of right labial abscess 10/7   Subjective:  Reports some pain at surgical site.  Objective: Vitals:   04/12/21 0425 04/12/21 0500 04/12/21 0810 04/12/21 1214  BP: (!) 188/85  (!) 179/70 (!) 165/72  Pulse: (!) 58     Resp: '14  12 20  '$ Temp: (!) 97.5 F (36.4 C)  97.6 F (36.4 C) 97.6 F (36.4 C)  TempSrc: Oral  Oral Oral  SpO2:      Weight:  64.5 kg    Height:        Intake/Output Summary (Last 24 hours) at 04/12/2021 1219 Last data filed at 04/11/2021 1313 Gross per 24 hour  Intake 120 ml  Output 0 ml  Net 120 ml   Filed Weights   04/10/21 1204 04/11/21 0245 04/12/21 0500  Weight: 60.3 kg 60.3 kg 64.5 kg    Examination:   Awake Alert, Oriented X 3, No new F.N deficits, Normal affect Symmetrical Chest wall movement, Good air movement bilaterally, CTAB RRR,No Gallops,Rubs or new Murmurs, No Parasternal Heave +ve B.Sounds, Abd Soft, No tenderness, No rebound - guarding or rigidity. No Cyanosis, Clubbing or edema, No new Rash or bruise     Data Reviewed: I have personally reviewed following labs and imaging studies  CBC: Recent Labs  Lab 04/08/21 2200 04/09/21 0305 04/10/21 0229 04/11/21 0836 04/12/21 0214  WBC 24.5* 24.8* 25.4*  25.1* 17.8*  NEUTROABS 15.4* 16.6* 19.4*  --   --   HGB 12.8 12.4 11.1* 10.8* 10.5*  HCT 40.1 41.7 35.9* 34.4* 33.2*  MCV 84.2 88.9 84.7 85.4 84.9  PLT 381 300 238 310 [id number]    Basic Metabolic Panel: Recent Labs  Lab 04/09/21 0200 04/09/21 0305 04/10/21 0229 04/11/21 0836 04/12/21 0214  NA 138 139 136 132* 134*  K 3.9 3.7 4.8 4.2 4.4  CL 100 99 95* 94* 93*  CO2 '25 26 24 26 26  '$ GLUCOSE 86 95 114* 350* 223*  BUN 16 15 27* 27* 40*  CREATININE 3.54* 3.59* 4.73* 4.39* 5.07*  CALCIUM 8.7* 8.8* 8.7* 8.3* 8.1*  MG  --  1.9  --   --   --    PHOS  --  3.5  --   --   --     GFR: Estimated Creatinine Clearance: 10.6 mL/min (A) (by C-G formula based on SCr of 5.07 mg/dL (H)).  Liver Function Tests: Recent Labs  Lab 04/09/21 0200 04/09/21 0305  AST 13* 12*  ALT 9 9  ALKPHOS 151* 126  BILITOT 0.6 0.6  PROT 7.1 7.2  ALBUMIN 2.8* 2.8*    CBG: Recent Labs  Lab 04/11/21 1212 04/11/21 1619 04/11/21 2229 04/12/21 0806 04/12/21 1212  GLUCAP 272* 231* 193* 179* 121*     Recent Results (from the past 240 hour(s))  Blood culture (routine x 2)     Status: None (Preliminary result)   Collection Time: 04/08/21 10:40 PM   Specimen: BLOOD  Result Value Ref Range Status   Specimen Description BLOOD BLOOD LEFT FOREARM  Final   Special Requests   Final    BOTTLES DRAWN AEROBIC AND ANAEROBIC Blood Culture results may not be optimal due to an inadequate volume of blood received in culture bottles   Culture   Final    NO GROWTH 3 DAYS Performed at [facility], [place] [address] [place], [place] [address]    Report Status PENDING  Incomplete  Resp Panel by RT-PCR (Flu A&B, Covid) Nasopharyngeal Swab     Status: None   Collection Time: 04/09/21 12:08 AM   Specimen: Nasopharyngeal Swab; Nasopharyngeal(NP) swabs in vial transport medium  Result Value Ref Range Status   SARS Coronavirus 2 by RT PCR NEGATIVE NEGATIVE Final    Comment: (NOTE) SARS-CoV-2 target nucleic acids are NOT DETECTED.  The SARS-CoV-2 RNA is generally detectable in upper respiratory specimens during the acute phase of infection. The lowest concentration of SARS-CoV-2 viral copies this assay can detect is 138 copies/mL. A negative result does not preclude SARS-Cov-2 infection and should not be used as the sole basis for treatment or other patient management decisions. A negative result may occur with  improper specimen collection/handling, submission of specimen other than nasopharyngeal swab, presence of viral mutation(s) within the areas targeted  by this assay, and inadequate number of viral copies(<138 copies/mL). A negative result must be combined with clinical observations, patient history, and epidemiological information. The expected result is Negative.  Fact Sheet for Patients:  EntrepreneurPulse.com.au  Fact Sheet for Healthcare Providers:  IncredibleEmployment.be  This test is no t yet approved or cleared by the [place] FDA and  has been authorized for detection and/or diagnosis of SARS-CoV-2 by FDA under an Emergency Use Authorization (EUA). This EUA will remain  in effect (meaning this test can be used) for the duration of the COVID-19 declaration under Section 564(b)(1) of the Act, 21 U.S.C.section 360bbb-3(b)(1), unless  the authorization is terminated  or revoked sooner.       Influenza A by PCR NEGATIVE NEGATIVE Final   Influenza B by PCR NEGATIVE NEGATIVE Final    Comment: (NOTE) The Xpert Xpress SARS-CoV-2/FLU/RSV plus assay is intended as an aid in the diagnosis of influenza from Nasopharyngeal swab specimens and should not be used as a sole basis for treatment. Nasal washings and aspirates are unacceptable for Xpert Xpress SARS-CoV-2/FLU/RSV testing.  Fact Sheet for Patients: EntrepreneurPulse.com.au  Fact Sheet for Healthcare Providers: IncredibleEmployment.be  This test is not yet approved or cleared by the [place] FDA and has been authorized for detection and/or diagnosis of SARS-CoV-2 by FDA under an Emergency Use Authorization (EUA). This EUA will remain in effect (meaning this test can be used) for the duration of the COVID-19 declaration under Section 564(b)(1) of the Act, 21 U.S.C. section 360bbb-3(b)(1), unless the authorization is terminated or revoked.  Performed at [facility], [place] [address] [place], [place] [address]   Blood culture (routine x 2)     Status: None (Preliminary result)    Collection Time: 04/09/21 12:11 AM   Specimen: BLOOD LEFT WRIST  Result Value Ref Range Status   Specimen Description BLOOD LEFT WRIST  Final   Special Requests   Final    BOTTLES DRAWN AEROBIC AND ANAEROBIC Blood Culture results may not be optimal due to an inadequate volume of blood received in culture bottles   Culture   Final    NO GROWTH 3 DAYS Performed at [facility], [place] [address] [place], [place] [address]    Report Status PENDING  Incomplete  Aerobic/Anaerobic Culture w Gram Stain (surgical/deep wound)     Status: None (Preliminary result)   Collection Time: 04/10/21  2:38 PM   Specimen: PATH Other; Body Fluid  Result Value Ref Range Status   Specimen Description WOUND  Final   Special Requests  RIGHT LABIAL ABCESS  Final   Gram Stain   Final    ABUNDANT WBC PRESENT,BOTH PMN AND MONONUCLEAR ABUNDANT GRAM POSITIVE COCCI MODERATE GRAM NEGATIVE RODS    Culture   Final    MODERATE PROTEUS MIRABILIS FEW STREPTOCOCCUS AGALACTIAE TESTING AGAINST S. AGALACTIAE NOT ROUTINELY PERFORMED DUE TO PREDICTABILITY OF AMP/PEN/VAN SUSCEPTIBILITY. SUSCEPTIBILITIES TO FOLLOW Performed at [facility], [place] [address], [place], [place] [address]    Report Status PENDING  Incomplete         Radiology Studies: No results found.      Scheduled Meds:  (feeding supplement) PROSource Plus  30 mL Oral BID BM   amLODipine  10 mg Oral Daily   bisacodyl  10 mg Oral Once   Chlorhexidine Gluconate Cloth  6 each Topical Daily   heparin injection (subcutaneous)  5,000 Units Subcutaneous Q8H   hydrALAZINE  75 mg Oral Q8H   insulin aspart  0-6 Units Subcutaneous TID WC   insulin glargine-yfgn  8 Units Subcutaneous Daily   polyethylene glycol  17 g Oral Daily   Continuous Infusions:  piperacillin-tazobactam (ZOSYN)  IV 2.25 g (04/12/21 0545)   promethazine (PHENERGAN) injection (IM or IVPB) Stopped (04/09/21 2358)   vancomycin Stopped (04/10/21 1248)     LOS: 3 days        Phillips Climes, MD [facility]   To contact the attending provider between 7A-7P or the covering provider during after hours 7P-7A, please log into the web site [email or web address] and access using universal [place] password for that web  site. If you do not have the password, please call the hospital operator.  04/12/2021, 12:19 PM

## 2021-04-12 NOTE — Progress Notes (Addendum)
Pharmacy Antibiotic Note  [name] [name] is a 55 y.o. female admitted on 04/08/2021 with PMH ESRD on HD MWF and T2DM found to have cellulitis with labial abscess. S/p I&D on 10/7. Pharmacy has been consulted for Vancomycin dosing.   10/6 vanc >> 10/6 CTX x 1 10/6 Zosyn> 10/6 Flagyl x 1  10/6 BCx - NGTD 10/7 woundCx - GPC and GNR, proteus mirabilis (panS)  Plan: -Cont Vancomycin 750 mg IV qHD MWF, next dose monday -Cont zosyn 2.25g q 8h -Trend WBC, temp, HD schedule -Drug levels as indicated   Height: 5' (152.4 cm) Weight: 64.5 kg (142 lb 3.2 oz) IBW/kg (Calculated) : 45.5  Temp (24hrs), Avg:97.6 F (36.4 C), Min:97.4 F (36.3 C), Max:98 F (36.7 C)  Recent Labs  Lab 04/08/21 2200 04/09/21 0200 04/09/21 0305 04/10/21 0229 04/11/21 0836 04/12/21 0214  WBC 24.5*  --  24.8* 25.4* 25.1* 17.8*  CREATININE  --  3.54* 3.59* 4.73* 4.39* 5.07*  LATICACIDVEN 1.3  --   --   --   --   --      Estimated Creatinine Clearance: 10.6 mL/min (A) (by C-G formula based on SCr of 5.07 mg/dL (H)).     Thank you for allowing pharmacy to participate in this patient's care.  Levonne Spiller, PharmD PGY1 Acute Care Resident  04/12/2021,1:22 PM

## 2021-04-13 ENCOUNTER — Encounter (HOSPITAL_COMMUNITY): Payer: Self-pay | Admitting: General Surgery

## 2021-04-13 DIAGNOSIS — N186 End stage renal disease: Secondary | ICD-10-CM | POA: Diagnosis not present

## 2021-04-13 DIAGNOSIS — N764 Abscess of vulva: Secondary | ICD-10-CM | POA: Diagnosis not present

## 2021-04-13 LAB — GLUCOSE, CAPILLARY
Glucose-Capillary: 109 mg/dL — ABNORMAL HIGH (ref 70–99)
Glucose-Capillary: 103 mg/dL — ABNORMAL HIGH (ref 70–99)
Glucose-Capillary: 95 mg/dL (ref 70–99)
Glucose-Capillary: 84 mg/dL (ref 70–99)
Glucose-Capillary: 108 mg/dL — ABNORMAL HIGH (ref 70–99)

## 2021-04-13 LAB — CBC
HCT: 33.2 % — ABNORMAL LOW (ref 36.0–46.0)
Hemoglobin: 10.4 g/dL — ABNORMAL LOW (ref 12.0–15.0)
MCH: 26.3 pg (ref 26.0–34.0)
MCHC: 31.3 g/dL (ref 30.0–36.0)
MCV: 83.8 fL (ref 80.0–100.0)
Platelets: 325 10*3/uL (ref 150–400)
RBC: 3.96 MIL/uL (ref 3.87–5.11)
RDW: 16.7 % — ABNORMAL HIGH (ref 11.5–15.5)
WBC: 15 10*3/uL — ABNORMAL HIGH (ref 4.0–10.5)
nRBC: 0 % (ref 0.0–0.2)

## 2021-04-13 LAB — BASIC METABOLIC PANEL
Anion gap: 16 — ABNORMAL HIGH (ref 5–15)
BUN: 59 mg/dL — ABNORMAL HIGH (ref 6–20)
CO2: 24 mmol/L (ref 22–32)
Calcium: 7.7 mg/dL — ABNORMAL LOW (ref 8.9–10.3)
Chloride: 94 mmol/L — ABNORMAL LOW (ref 98–111)
Creatinine, Ser: 6.5 mg/dL — ABNORMAL HIGH (ref 0.44–1.00)
GFR, Estimated: 7 mL/min — ABNORMAL LOW (ref >60)
Glucose, Bld: 95 mg/dL (ref 70–99)
Potassium: 4.7 mmol/L (ref 3.5–5.1)
Sodium: 134 mmol/L — ABNORMAL LOW (ref 135–145)

## 2021-04-13 LAB — AEROBIC/ANAEROBIC CULTURE W GRAM STAIN (SURGICAL/DEEP WOUND)

## 2021-04-13 MED ORDER — HEPARIN SODIUM (PORCINE) 1000 UNIT/ML IJ SOLN
INTRAMUSCULAR | Status: AC
  Administered 2021-04-13: 2000 [IU] via INTRAVENOUS_CENTRAL
  Filled 2021-04-13: qty 2

## 2021-04-13 MED ORDER — CEFAZOLIN SODIUM-DEXTROSE 1-4 GM/50ML-% IV SOLN
1.0000 g | INTRAVENOUS | Status: AC
  Administered 2021-04-13: 1 g via INTRAVENOUS
  Filled 2021-04-13: qty 50

## 2021-04-13 MED ORDER — BISACODYL 5 MG PO TBEC
5.0000 mg | DELAYED_RELEASE_TABLET | Freq: Every day | ORAL | Status: DC | PRN
  Administered 2021-04-13: 5 mg via ORAL
  Filled 2021-04-13: qty 1

## 2021-04-13 MED ORDER — HEPARIN SODIUM (PORCINE) 1000 UNIT/ML DIALYSIS
2000.0000 [IU] | INTRAMUSCULAR | Status: AC | PRN
  Administered 2021-04-13: 2000 [IU] via INTRAVENOUS_CENTRAL
  Filled 2021-04-13 (×3): qty 2

## 2021-04-13 MED ORDER — AMOXICILLIN-POT CLAVULANATE 500-125 MG PO TABS
500.0000 mg | ORAL_TABLET | Freq: Two times a day (BID) | ORAL | Status: DC
  Administered 2021-04-14: 500 mg via ORAL
  Filled 2021-04-13 (×2): qty 1

## 2021-04-13 MED ORDER — HYDRALAZINE HCL 50 MG PO TABS
100.0000 mg | ORAL_TABLET | Freq: Three times a day (TID) | ORAL | Status: DC
  Administered 2021-04-13 – 2021-04-14 (×3): 100 mg via ORAL
  Filled 2021-04-13 (×3): qty 2

## 2021-04-13 MED ORDER — HYDRALAZINE HCL 50 MG PO TABS: 100.0000 mg | ORAL_TABLET | Freq: Three times a day (TID) | ORAL | Status: DC

## 2021-04-13 NOTE — Discharge Instructions (Addendum)
WOUND CARE: - dressing to be changed twice daily - supplies: sterile saline, kerlix/guaze, scissors, Pad or bried - remove dressing and all packing carefully, moistening with sterile saline as needed to avoid packing/internal dressing sticking to the wound. - clean edges of skin around the wound with water/gauze, making sure there is no tape debris or leakage left on skin that could cause skin irritation or breakdown. - dampen clean kerlix/gauze with sterile saline and pack wound from wound base to skin level, making sure to take note of any possible areas of wound tracking, tunneling and packing appropriately. Wound can be packed loosely. Trim kerlix/gauze to size if a whole roll/piece is not required. - cover wound with a dry dressing and brief/pad  - write the date/time on the dry dressing/tape to better track when the last dressing change occurred. - apply any skin protectant/powder recommended by clinician to protect skin/skin folds. - change dressing as needed if leakage occurs, wound gets contaminated, or patient requests to shower. - patient may shower daily with wound open and following the shower the wound should be dried and a clean dressing placed.

## 2021-04-13 NOTE — Progress Notes (Signed)
PROGRESS NOTE    [name] [name]  [id number] DOB: 06-10-1966 DOA: 04/08/2021 PCP: Myrtis Hopping., MD    Chief Complaint  Patient presents with   Abscess    Brief Narrative:   [name] [name] is a 55 y.o. female with medical history significant for end-stage renal disease on hemodialysis (M,W,F), type 2 diabetes mellitus, who is admitted to [facility] on 04/08/2021 with suprapubic cellulitis after presenting from home to [facility] ED complaining of abdominal erythema.  Her work-up was significant for suprapubic cellulitis with labial abscess, GYN consulted, who requested general surgery UA showing, patient s/p I&D by general surgery 10/7.  Assessment & Plan:   Principal Problem:   Cellulitis of suprapubic region Active Problems:   ESRD (end stage renal disease) (HCC)   Leukocytosis   DM2 (diabetes mellitus, type 2) (HCC)  Right labial abscess/cellulitis -General surgery consult greatly appreciated, status post I&D, initially treated with vancomycin, Rocephin and Flagyl, Ancef transitioned to Zosyn. -Intraoperative culture growing Proteus and Streptococcus, continue with Zosyn, she did report some itching yesterday, if recurrent today can switch Zosyn to Rocephin, will await annual sensitivities before determining p.o. regimen for discharge. -Continue with pain control. -Blood cultures remains negative, continue to monitor -Leukocytosis trending down.  ESRD -Renal consult greatly appreciated, HD per renal.   Hypertension -Blood pressure remains elevated, will increase hydralazine TO 75  mg oral 3 times daily blood, continue with amlodipine, will discontinue Coreg given heart rate in the mid 50s.     Type 2 diabetes mellitus -Follow on A1c -Continue with insulin sliding scale during hospital stay -BG significantly elevated so started on low-dose Lantus.    DVT prophylaxis: SCD, [place] heparin Code Status: Full Family Communication: None at bedside Disposition:   Status  is: Inpatient  Remains inpatient appropriate because:IV treatments appropriate due to intensity of illness or inability to take PO  Dispo: The patient is from: Home              Anticipated d/c is to: Home              Patient currently is not medically stable to d/c.   Difficult to place patient No       Consultants:  Renal General surgery  Procedures:  I&D of right labial abscess 10/7   Subjective:  No significant events overnight, still complaining of pain at surgical site.  Objective: Vitals:   04/12/21 2100 04/13/21 0020 04/13/21 0950 04/13/21 1212  BP: 130/60 (!) 142/62 (!) 178/84 (!) 184/83  Pulse: 62 62 72 72  Resp: '13 15 16 15  '$ Temp: 98.4 F (36.9 C) 98.6 F (37 C)  98.4 F (36.9 C)  TempSrc: Oral Oral Oral Oral  SpO2: 99% 100% 96% 96%  Weight:      Height:        Intake/Output Summary (Last 24 hours) at 04/13/2021 1408 Last data filed at 04/13/2021 0800 Gross per 24 hour  Intake 495 ml  Output --  Net 495 ml   Filed Weights   04/10/21 1204 04/11/21 0245 04/12/21 0500  Weight: 60.3 kg 60.3 kg 64.5 kg    Examination:  Awake Alert, Oriented X 3, No new F.N deficits, Normal affect Symmetrical Chest wall movement, Good air movement bilaterally, CTAB RRR,No Gallops,Rubs or new Murmurs, No Parasternal Heave +ve B.Sounds, Abd Soft, No tenderness, No rebound - guarding or rigidity. No Cyanosis, Clubbing or edema, No new Rash or bruise      Data Reviewed: I have  personally reviewed following labs and imaging studies  CBC: Recent Labs  Lab 04/08/21 2200 04/09/21 0305 04/10/21 0229 04/11/21 0836 04/12/21 0214 04/13/21 0556  WBC 24.5* 24.8* 25.4* 25.1* 17.8* 15.0*  NEUTROABS 15.4* 16.6* 19.4*  --   --   --   HGB 12.8 12.4 11.1* 10.8* 10.5* 10.4*  HCT 40.1 41.7 35.9* 34.4* 33.2* 33.2*  MCV 84.2 88.9 84.7 85.4 84.9 83.8  PLT 381 300 238 310 289 [id number]    Basic Metabolic Panel: Recent Labs  Lab 04/09/21 0305 04/10/21 0229 04/11/21 0836  04/12/21 0214 04/13/21 0556  NA 139 136 132* 134* 134*  K 3.7 4.8 4.2 4.4 4.7  CL 99 95* 94* 93* 94*  CO2 '26 24 26 26 24  '$ GLUCOSE 95 114* 350* 223* 95  BUN 15 27* 27* 40* 59*  CREATININE 3.59* 4.73* 4.39* 5.07* 6.50*  CALCIUM 8.8* 8.7* 8.3* 8.1* 7.7*  MG 1.9  --   --   --   --   PHOS 3.5  --   --   --   --     GFR: Estimated Creatinine Clearance: 8.3 mL/min (A) (by C-G formula based on SCr of 6.5 mg/dL (H)).  Liver Function Tests: Recent Labs  Lab 04/09/21 0200 04/09/21 0305  AST 13* 12*  ALT 9 9  ALKPHOS 151* 126  BILITOT 0.6 0.6  PROT 7.1 7.2  ALBUMIN 2.8* 2.8*    CBG: Recent Labs  Lab 04/12/21 1212 04/12/21 1657 04/12/21 2100 04/13/21 0802 04/13/21 1128  GLUCAP 121* 122* 111* 103* 95     Recent Results (from the past 240 hour(s))  Blood culture (routine x 2)     Status: None (Preliminary result)   Collection Time: 04/08/21 10:40 PM   Specimen: BLOOD  Result Value Ref Range Status   Specimen Description BLOOD BLOOD LEFT FOREARM  Final   Special Requests   Final    BOTTLES DRAWN AEROBIC AND ANAEROBIC Blood Culture results may not be optimal due to an inadequate volume of blood received in culture bottles   Culture   Final    NO GROWTH 4 DAYS Performed at [facility], [place] [address], [place], [place] [address]    Report Status PENDING  Incomplete  Resp Panel by RT-PCR (Flu A&B, Covid) Nasopharyngeal Swab     Status: None   Collection Time: 04/09/21 12:08 AM   Specimen: Nasopharyngeal Swab; Nasopharyngeal(NP) swabs in vial transport medium  Result Value Ref Range Status   SARS Coronavirus 2 by RT PCR NEGATIVE NEGATIVE Final    Comment: (NOTE) SARS-CoV-2 target nucleic acids are NOT DETECTED.  The SARS-CoV-2 RNA is generally detectable in upper respiratory specimens during the acute phase of infection. The lowest concentration of SARS-CoV-2 viral copies this assay can detect is 138 copies/mL. A negative result does not preclude  SARS-Cov-2 infection and should not be used as the sole basis for treatment or other patient management decisions. A negative result may occur with  improper specimen collection/handling, submission of specimen other than nasopharyngeal swab, presence of viral mutation(s) within the areas targeted by this assay, and inadequate number of viral copies(<138 copies/mL). A negative result must be combined with clinical observations, patient history, and epidemiological information. The expected result is Negative.  Fact Sheet for Patients:  EntrepreneurPulse.com.au  Fact Sheet for Healthcare Providers:  IncredibleEmployment.be  This test is no t yet approved or cleared by the [place] FDA and  has been authorized for detection and/or diagnosis of SARS-CoV-2  by FDA under an Emergency Use Authorization (EUA). This EUA will remain  in effect (meaning this test can be used) for the duration of the COVID-19 declaration under Section 564(b)(1) of the Act, 21 U.S.C.section 360bbb-3(b)(1), unless the authorization is terminated  or revoked sooner.       Influenza A by PCR NEGATIVE NEGATIVE Final   Influenza B by PCR NEGATIVE NEGATIVE Final    Comment: (NOTE) The Xpert Xpress SARS-CoV-2/FLU/RSV plus assay is intended as an aid in the diagnosis of influenza from Nasopharyngeal swab specimens and should not be used as a sole basis for treatment. Nasal washings and aspirates are unacceptable for Xpert Xpress SARS-CoV-2/FLU/RSV testing.  Fact Sheet for Patients: EntrepreneurPulse.com.au  Fact Sheet for Healthcare Providers: IncredibleEmployment.be  This test is not yet approved or cleared by the [place] FDA and has been authorized for detection and/or diagnosis of SARS-CoV-2 by FDA under an Emergency Use Authorization (EUA). This EUA will remain in effect (meaning this test can be used) for the duration of  the COVID-19 declaration under Section 564(b)(1) of the Act, 21 U.S.C. section 360bbb-3(b)(1), unless the authorization is terminated or revoked.  Performed at [facility], [place] [address] [place], [place] [address]   Blood culture (routine x 2)     Status: None (Preliminary result)   Collection Time: 04/09/21 12:11 AM   Specimen: BLOOD LEFT WRIST  Result Value Ref Range Status   Specimen Description BLOOD LEFT WRIST  Final   Special Requests   Final    BOTTLES DRAWN AEROBIC AND ANAEROBIC Blood Culture results may not be optimal due to an inadequate volume of blood received in culture bottles   Culture   Final    NO GROWTH 4 DAYS Performed at [facility], [place] [address], [place], [place] [address]    Report Status PENDING  Incomplete  Aerobic/Anaerobic Culture w Gram Stain (surgical/deep wound)     Status: None (Preliminary result)   Collection Time: 04/10/21  2:38 PM   Specimen: PATH Other; Body Fluid  Result Value Ref Range Status   Specimen Description WOUND  Final   Special Requests  RIGHT LABIAL ABCESS  Final   Gram Stain   Final    ABUNDANT WBC PRESENT,BOTH PMN AND MONONUCLEAR ABUNDANT GRAM POSITIVE COCCI MODERATE GRAM NEGATIVE RODS    Culture   Final    MODERATE PROTEUS MIRABILIS FEW STREPTOCOCCUS AGALACTIAE TESTING AGAINST S. AGALACTIAE NOT ROUTINELY PERFORMED DUE TO PREDICTABILITY OF AMP/PEN/VAN SUSCEPTIBILITY. CULTURE REINCUBATED FOR BETTER GROWTH Performed at [facility], [place] [address] [place], [place] [address]    Report Status PENDING  Incomplete   Organism ID, Bacteria PROTEUS MIRABILIS  Final      Susceptibility   Proteus mirabilis - MIC*    AMPICILLIN <=2 SENSITIVE Sensitive     CEFAZOLIN <=4 SENSITIVE Sensitive     CEFEPIME <=0.12 SENSITIVE Sensitive     CEFTAZIDIME 2 SENSITIVE Sensitive     CEFTRIAXONE <=0.25 SENSITIVE Sensitive     CIPROFLOXACIN <=0.25 SENSITIVE Sensitive     GENTAMICIN <=1 SENSITIVE Sensitive     IMIPENEM 4  SENSITIVE Sensitive     TRIMETH/SULFA <=20 SENSITIVE Sensitive     AMPICILLIN/SULBACTAM <=2 SENSITIVE Sensitive     PIP/TAZO <=4 SENSITIVE Sensitive     * MODERATE PROTEUS MIRABILIS         Radiology Studies: No results found.      Scheduled Meds:  (feeding supplement) PROSource Plus  30 mL Oral  BID BM   amLODipine  10 mg Oral Daily   [START ON 04/14/2021] amoxicillin-clavulanate  500 mg Oral BID   Chlorhexidine Gluconate Cloth  6 each Topical Daily   heparin injection (subcutaneous)  5,000 Units Subcutaneous Q8H   hydrALAZINE  75 mg Oral Q8H   insulin aspart  0-6 Units Subcutaneous TID WC   insulin glargine-yfgn  8 Units Subcutaneous Daily   polyethylene glycol  17 g Oral Daily   Continuous Infusions:   ceFAZolin (ANCEF) IV     promethazine (PHENERGAN) injection (IM or IVPB) Stopped (04/09/21 2358)     LOS: 4 days       Phillips Climes, MD [facility]   To contact the attending provider between 7A-7P or the covering provider during after hours 7P-7A, please log into the web site [email or web address] and access using universal [place] password for that web site. If you do not have the password, please call the hospital operator.  04/13/2021, 2:08 PM

## 2021-04-13 NOTE — Anesthesia Postprocedure Evaluation (Signed)
Anesthesia Post Note  Patient: [name] [name]  Procedure(s) Performed: INCISION AND DRAINAGE ABSCESS LABIA (Vagina )     Patient location during evaluation: PACU Anesthesia Type: General Level of consciousness: awake and alert Pain management: pain level controlled Vital Signs Assessment: post-procedure vital signs reviewed and stable Respiratory status: spontaneous breathing, nonlabored ventilation, respiratory function stable and patient connected to nasal cannula oxygen Cardiovascular status: blood pressure returned to baseline and stable Postop Assessment: no apparent nausea or vomiting Anesthetic complications: no   No notable events documented.  Last Vitals:  Vitals:   04/12/21 2100 04/13/21 0020  BP: 130/60 (!) 142/62  Pulse: 62 62  Resp: 13 15  Temp: 36.9 C 37 C  SpO2: 99% 100%    Last Pain:  Vitals:   04/13/21 0020  TempSrc: Oral  PainSc:                  [name] [name]

## 2021-04-13 NOTE — Evaluation (Signed)
Physical Therapy Evaluation Patient Details Name: [name] [name] MRN: [id number] DOB: 1965-09-24 Today's Date: 04/13/2021  History of Present Illness  55yo female who presented on 10/6 with labial pain. Found to have R labial abscess and received I&D 10/7. PMH DM with neuropathy, ESRD on HD, R femoral neck fx s/p cannulated hip pinning, HTN, syncope and collapse, L tibial ORIF  Clinical Impression   Patient received in bed, very pleasant but a bit frustrated with her current situation; reports she had been getting HHPT prior to this hospitalization but was getting ready to transition to OP PT. Needed heavy levels of assist just to get to EOB, unable to maintain balance once at edge due to posterior lean driven by groin/surgical wound pain. Has been using hoyer lift at home to get to/from power chair for HD sessions, otherwise bed bound. Noted to have bilateral plantar flexion contractures with wounds on dorsal surface of both feet- will need to problem solve a way for her to be able to tolerate contracture boots, for now educated on self stretching for PF musculature using linens. Left in bed positioned to comfort with all needs met, RN present. Will continue efforts. Refusing SNF or HHPT, but has transportation to be able to get to OP PT services after DC.      Recommendations for follow up therapy are one component of a multi-disciplinary discharge planning process, led by the attending physician.  Recommendations may be updated based on patient status, additional functional criteria and insurance authorization.  Follow Up Recommendations Outpatient PT;Other (comment) (refusing SNF or HHPT)    Equipment Recommendations  Other (comment) (well equipped)    Recommendations for Other Services       Precautions / Restrictions Precautions Precautions: Fall;Other (comment) Precaution Comments: impaired vision, B plantar flexion contractures, groin wound Restrictions Weight Bearing Restrictions:  No      Mobility  Bed Mobility Overal bed mobility: Needs Assistance Bed Mobility: Rolling;Sidelying to Sit;Sit to Supine Rolling: Max assist Sidelying to sit: Max assist;HOB elevated   Sit to supine: Max assist;HOB elevated   General bed mobility comments: MaxA for all bed mobility today; once at EOB had hard posterior lean with limited ability to correct due to groin pain    Transfers                 General transfer comment: unable- will need +2 assist  Ambulation/Gait             General Gait Details: non-ambulatory, transfers to power chair historically  Stairs            Wheelchair Mobility    Modified Rankin (Stroke Patients Only)       Balance Overall balance assessment: Needs assistance Sitting-balance support: Bilateral upper extremity supported;Feet unsupported Sitting balance-Leahy Scale: Poor Sitting balance - Comments: heavy posterior lean requiring MaxA for balance/safety Postural control: Posterior lean                                   Pertinent Vitals/Pain Pain Assessment: 0-10 Pain Score: 9  Pain Location: groin wound after dressing change Pain Descriptors / Indicators: Aching;Discomfort Pain Intervention(s): Limited activity within patient's tolerance;Monitored during session    Home Living Family/patient expects to be discharged to:: Private residence Living Arrangements: Spouse/significant other Available Help at Discharge: Family;Available PRN/intermittently;Personal care attendant (PCA is not there all day- is there in the morning and afternoon about 2 hours total)  Type of Home: House Home Access: Ramped entrance     Home Layout: One level Home Equipment: Bedside commode;Wheelchair - power;Shower seat;Hospital bed;Walker - 2 wheels;Other (comment) (hoyer lift) Additional Comments: bathroom in not accessible to walker or w/c, sponge bathes and uses BSC    Prior Function Level of Independence: Needs  assistance   Gait / Transfers Assistance Needed: having a hard time with transfers- getting ready to go to to OP. Needs "a LOT" of help for OOB and transfers. Had been doing lateral scoots.  ADL's / Homemaking Assistance Needed: aide helps with cleaning backside, pt can do front; aide also sets up bathing equipment and clothes        Hand Dominance   Dominant Hand: Right    Extremity/Trunk Assessment   Upper Extremity Assessment Upper Extremity Assessment: Defer to OT evaluation    Lower Extremity Assessment Lower Extremity Assessment: Generalized weakness    Cervical / Trunk Assessment Cervical / Trunk Assessment: Kyphotic  Communication   Communication: No difficulties  Cognition Arousal/Alertness: Awake/alert Behavior During Therapy: WFL for tasks assessed/performed Overall Cognitive Status: Within Functional Limits for tasks assessed                                        General Comments General comments (skin integrity, edema, etc.): B plantar flexion contractures along with wounds on dorsal surface of both feet    Exercises     Assessment/Plan    PT Assessment Patient needs continued PT services  PT Problem List Decreased strength;Decreased range of motion;Decreased knowledge of use of DME;Decreased activity tolerance;Decreased safety awareness;Decreased skin integrity;Decreased balance;Pain;Decreased mobility       PT Treatment Interventions DME instruction;Balance training;Gait training;Functional mobility training;Patient/family education;Therapeutic activities;Wheelchair mobility training;Therapeutic exercise    PT Goals (Current goals can be found in the Care Plan section)  Acute Rehab PT Goals Patient Stated Goal: go to OP and really work on getting better with mobility PT Goal Formulation: With patient Time For Goal Achievement: 04/27/21 Potential to Achieve Goals: Fair    Frequency Min 3X/week   Barriers to discharge         Co-evaluation               AM-PAC PT "6 Clicks" Mobility  Outcome Measure Help needed turning from your back to your side while in a flat bed without using bedrails?: A Lot Help needed moving from lying on your back to sitting on the side of a flat bed without using bedrails?: A Lot Help needed moving to and from a bed to a chair (including a wheelchair)?: Total Help needed standing up from a chair using your arms (e.g., wheelchair or bedside chair)?: Total Help needed to walk in hospital room?: Total Help needed climbing 3-5 steps with a railing? : Total 6 Click Score: 8    End of Session   Activity Tolerance: Patient limited by pain Patient left: in bed;with call bell/phone within reach Nurse Communication: Mobility status PT Visit Diagnosis: Muscle weakness (generalized) (M62.81);Other abnormalities of gait and mobility (R26.89);Pain Pain - Right/Left:  (central) Pain - part of body:  (groin)    Time: 3295-1884 PT Time Calculation (min) (ACUTE ONLY): 33 min   Charges:   PT Evaluation $PT Eval Moderate Complexity: 1 Mod PT Treatments $Therapeutic Activity: 8-22 mins       Burgess Sheriff U PT, DPT, PN2   Supplemental Physical Therapist [facility]    Pager [phone] Acute Rehab Office [phone]

## 2021-04-13 NOTE — Progress Notes (Signed)
UF goal decreased.  

## 2021-04-13 NOTE — Progress Notes (Signed)
PROGRESS NOTE    [name] [name]  [id number] DOB: [birth year] DOA: 04/08/2021 PCP: Myrtis Hopping., MD    Chief Complaint  Patient presents with   Abscess    Brief Narrative:   [name] [name] is a 55 y.o. female with medical history significant for end-stage renal disease on hemodialysis (M,W,F), type 2 diabetes mellitus, who is admitted to [facility] on 04/08/2021 with suprapubic cellulitis after presenting from home to [facility] ED complaining of abdominal erythema.  Her work-up was significant for suprapubic cellulitis with labial abscess, GYN consulted, who requested general surgery UA showing, patient s/p I&D by general surgery 10/7.  Assessment & Plan:   Principal Problem:   Cellulitis of suprapubic region Active Problems:   ESRD (end stage renal disease) (HCC)   Leukocytosis   DM2 (diabetes mellitus, type 2) (HCC)  Right labial abscess/cellulitis -General surgery consult greatly appreciated, status post I&D, initially treated with vancomycin, Rocephin and Flagyl>> zosyn, intraoperative culture growing Proteus and Streptococcus, so she is transition to Ancef, can be transitioned to oral antibiotics tomorrow for total of 5 days. -Continue with pain control. -Blood cultures remains negative, continue to monitor -Leukocytosis trending down.  ESRD -Renal consult greatly appreciated, HD per renal.  Hemodialysis today.  Hypertension -Blood pressure remains elevated, continue with Norvasc at current dose, will increase hydralazine to 100 mg oral 3 times daily.    Type 2 diabetes mellitus -Follow on A1c -Continue with insulin sliding scale during hospital stay -BG significantly elevated so started on low-dose Lantus.    DVT prophylaxis: SCD, [place] heparin Code Status: Full Family Communication: None at bedside Disposition:   Status is: Inpatient  Remains inpatient appropriate because:IV treatments appropriate due to intensity of illness or inability to take PO  Dispo:  The patient is from: Home              Anticipated d/c is to: Home              Patient currently is not medically stable to d/c.   Difficult to place patient No       Consultants:  Renal General surgery  Procedures:  I&D of right labial abscess 10/7   Subjective:  Pain is controlled, she is anxious about doing her and wound care.  Objective: Vitals:   04/12/21 2100 04/13/21 0020 04/13/21 0950 04/13/21 1212  BP: 130/60 (!) 142/62 (!) 178/84 (!) 184/83  Pulse: 62 62 72 72  Resp: '13 15 16 15  '$ Temp: 98.4 F (36.9 C) 98.6 F (37 C)  98.4 F (36.9 C)  TempSrc: Oral Oral Oral Oral  SpO2: 99% 100% 96% 96%  Weight:      Height:        Intake/Output Summary (Last 24 hours) at 04/13/2021 1416 Last data filed at 04/13/2021 0800 Gross per 24 hour  Intake 495 ml  Output --  Net 495 ml    Filed Weights   04/10/21 1204 04/11/21 0245 04/12/21 0500  Weight: 60.3 kg 60.3 kg 64.5 kg    Examination:   Awake Alert, Oriented X 3, No new F.N deficits, Normal affect Symmetrical Chest wall movement, Good air movement bilaterally, CTAB RRR,No Gallops,Rubs or new Murmurs, No Parasternal Heave +ve B.Sounds, Abd Soft, No tenderness, No rebound - guarding or rigidity. No Cyanosis, Clubbing or edema, No new Rash or bruise     Data Reviewed: I have personally reviewed following labs and imaging studies  CBC: Recent Labs  Lab 04/08/21 2200 04/09/21 0305 04/10/21  [id number] 04/11/21 0836 04/12/21 0214 04/13/21 0556  WBC 24.5* 24.8* 25.4* 25.1* 17.8* 15.0*  NEUTROABS 15.4* 16.6* 19.4*  --   --   --   HGB 12.8 12.4 11.1* 10.8* 10.5* 10.4*  HCT 40.1 41.7 35.9* 34.4* 33.2* 33.2*  MCV 84.2 88.9 84.7 85.4 84.9 83.8  PLT 381 300 238 310 289 325     Basic Metabolic Panel: Recent Labs  Lab 04/09/21 0305 04/10/21 0229 04/11/21 0836 04/12/21 0214 04/13/21 0556  NA 139 136 132* 134* 134*  K 3.7 4.8 4.2 4.4 4.7  CL 99 95* 94* 93* 94*  CO2 '26 24 26 26 24  '$ GLUCOSE 95 114* 350*  223* 95  BUN 15 27* 27* 40* 59*  CREATININE 3.59* 4.73* 4.39* 5.07* 6.50*  CALCIUM 8.8* 8.7* 8.3* 8.1* 7.7*  MG 1.9  --   --   --   --   PHOS 3.5  --   --   --   --      GFR: Estimated Creatinine Clearance: 8.3 mL/min (A) (by C-G formula based on SCr of 6.5 mg/dL (H)).  Liver Function Tests: Recent Labs  Lab 04/09/21 0200 04/09/21 0305  AST 13* 12*  ALT 9 9  ALKPHOS 151* 126  BILITOT 0.6 0.6  PROT 7.1 7.2  ALBUMIN 2.8* 2.8*     CBG: Recent Labs  Lab 04/12/21 1212 04/12/21 1657 04/12/21 2100 04/13/21 0802 04/13/21 1128  GLUCAP 121* 122* 111* 103* 95      Recent Results (from the past 240 hour(s))  Blood culture (routine x 2)     Status: None (Preliminary result)   Collection Time: 04/08/21 10:40 PM   Specimen: BLOOD  Result Value Ref Range Status   Specimen Description BLOOD BLOOD LEFT FOREARM  Final   Special Requests   Final    BOTTLES DRAWN AEROBIC AND ANAEROBIC Blood Culture results may not be optimal due to an inadequate volume of blood received in culture bottles   Culture   Final    NO GROWTH 4 DAYS Performed at [facility], [place] [address] [place], [place] [address]    Report Status PENDING  Incomplete  Resp Panel by RT-PCR (Flu A&B, Covid) Nasopharyngeal Swab     Status: None   Collection Time: 04/09/21 12:08 AM   Specimen: Nasopharyngeal Swab; Nasopharyngeal(NP) swabs in vial transport medium  Result Value Ref Range Status   SARS Coronavirus 2 by RT PCR NEGATIVE NEGATIVE Final    Comment: (NOTE) SARS-CoV-2 target nucleic acids are NOT DETECTED.  The SARS-CoV-2 RNA is generally detectable in upper respiratory specimens during the acute phase of infection. The lowest concentration of SARS-CoV-2 viral copies this assay can detect is 138 copies/mL. A negative result does not preclude SARS-Cov-2 infection and should not be used as the sole basis for treatment or other patient management decisions. A negative result may occur with  improper  specimen collection/handling, submission of specimen other than nasopharyngeal swab, presence of viral mutation(s) within the areas targeted by this assay, and inadequate number of viral copies(<138 copies/mL). A negative result must be combined with clinical observations, patient history, and epidemiological information. The expected result is Negative.  Fact Sheet for Patients:  EntrepreneurPulse.com.au  Fact Sheet for Healthcare Providers:  IncredibleEmployment.be  This test is no t yet approved or cleared by the [place] FDA and  has been authorized for detection and/or diagnosis of SARS-CoV-2 by FDA under an Emergency Use Authorization (EUA). This EUA will remain  in  effect (meaning this test can be used) for the duration of the COVID-19 declaration under Section 564(b)(1) of the Act, 21 U.S.C.section 360bbb-3(b)(1), unless the authorization is terminated  or revoked sooner.       Influenza A by PCR NEGATIVE NEGATIVE Final   Influenza B by PCR NEGATIVE NEGATIVE Final    Comment: (NOTE) The Xpert Xpress SARS-CoV-2/FLU/RSV plus assay is intended as an aid in the diagnosis of influenza from Nasopharyngeal swab specimens and should not be used as a sole basis for treatment. Nasal washings and aspirates are unacceptable for Xpert Xpress SARS-CoV-2/FLU/RSV testing.  Fact Sheet for Patients: EntrepreneurPulse.com.au  Fact Sheet for Healthcare Providers: IncredibleEmployment.be  This test is not yet approved or cleared by the [place] FDA and has been authorized for detection and/or diagnosis of SARS-CoV-2 by FDA under an Emergency Use Authorization (EUA). This EUA will remain in effect (meaning this test can be used) for the duration of the COVID-19 declaration under Section 564(b)(1) of the Act, 21 U.S.C. section 360bbb-3(b)(1), unless the authorization is terminated or revoked.  Performed at  [facility], [place] [address] [place], [place] [address]   Blood culture (routine x 2)     Status: None (Preliminary result)   Collection Time: 04/09/21 12:11 AM   Specimen: BLOOD LEFT WRIST  Result Value Ref Range Status   Specimen Description BLOOD LEFT WRIST  Final   Special Requests   Final    BOTTLES DRAWN AEROBIC AND ANAEROBIC Blood Culture results may not be optimal due to an inadequate volume of blood received in culture bottles   Culture   Final    NO GROWTH 4 DAYS Performed at [facility], [place] [address] [place], [place] [address]    Report Status PENDING  Incomplete  Aerobic/Anaerobic Culture w Gram Stain (surgical/deep wound)     Status: None (Preliminary result)   Collection Time: 04/10/21  2:38 PM   Specimen: PATH Other; Body Fluid  Result Value Ref Range Status   Specimen Description WOUND  Final   Special Requests  RIGHT LABIAL ABCESS  Final   Gram Stain   Final    ABUNDANT WBC PRESENT,BOTH PMN AND MONONUCLEAR ABUNDANT GRAM POSITIVE COCCI MODERATE GRAM NEGATIVE RODS    Culture   Final    MODERATE PROTEUS MIRABILIS FEW STREPTOCOCCUS AGALACTIAE TESTING AGAINST S. AGALACTIAE NOT ROUTINELY PERFORMED DUE TO PREDICTABILITY OF AMP/PEN/VAN SUSCEPTIBILITY. CULTURE REINCUBATED FOR BETTER GROWTH Performed at [facility], [place] [address] [place], [place] [address]    Report Status PENDING  Incomplete   Organism ID, Bacteria PROTEUS MIRABILIS  Final      Susceptibility   Proteus mirabilis - MIC*    AMPICILLIN <=2 SENSITIVE Sensitive     CEFAZOLIN <=4 SENSITIVE Sensitive     CEFEPIME <=0.12 SENSITIVE Sensitive     CEFTAZIDIME 2 SENSITIVE Sensitive     CEFTRIAXONE <=0.25 SENSITIVE Sensitive     CIPROFLOXACIN <=0.25 SENSITIVE Sensitive     GENTAMICIN <=1 SENSITIVE Sensitive     IMIPENEM 4 SENSITIVE Sensitive     TRIMETH/SULFA <=20 SENSITIVE Sensitive     AMPICILLIN/SULBACTAM <=2 SENSITIVE Sensitive     PIP/TAZO <=4 SENSITIVE Sensitive     *  MODERATE PROTEUS MIRABILIS          Radiology Studies: No results found.      Scheduled Meds:  (feeding supplement) PROSource Plus  30 mL Oral BID BM   amLODipine  10 mg Oral Daily   [START  ON 04/14/2021] amoxicillin-clavulanate  500 mg Oral BID   Chlorhexidine Gluconate Cloth  6 each Topical Daily   heparin injection (subcutaneous)  5,000 Units Subcutaneous Q8H   hydrALAZINE  100 mg Oral Q8H   insulin aspart  0-6 Units Subcutaneous TID WC   insulin glargine-yfgn  8 Units Subcutaneous Daily   polyethylene glycol  17 g Oral Daily   Continuous Infusions:   ceFAZolin (ANCEF) IV     promethazine (PHENERGAN) injection (IM or IVPB) Stopped (04/09/21 2358)     LOS: 4 days       Phillips Climes, MD [facility]   To contact the attending provider between 7A-7P or the covering provider during after hours 7P-7A, please log into the web site [email or web address] and access using universal [place] password for that web site. If you do not have the password, please call the hospital operator.  04/13/2021, 2:16 PM

## 2021-04-13 NOTE — Progress Notes (Signed)
Attempted to change dressing twice today at 12pm and 3pm but she keeps telling me she wants to wait until after dialysis. HD told me they would dialyze her after 6pm.

## 2021-04-13 NOTE — Progress Notes (Signed)
Progress Note  3 Days Post-Op  Subjective: Patient reports some pain with dressing changes, but tolerating well. Discussed wound care needs for discharge. She does not want to go to a facility. She has a [facility] RN that comes 2x/weekly already and has an aide that comes daily. She does not feel like she could change her dressing herself. She reports that her husband will not change the dressing but does not seem interested in asking him if he would be able to help.   Objective: Vital signs in last 24 hours: Temp:  [97.6 F (36.4 C)-98.6 F (37 C)] 98.6 F (37 C) (10/10 0020) Pulse Rate:  [59-62] 62 (10/10 0020) Resp:  [13-20] 15 (10/10 0020) BP: (130-165)/(60-72) 142/62 (10/10 0020) SpO2:  [99 %-100 %] 100 % (10/10 0020) Last BM Date: 04/09/21  Intake/Output from previous day: 10/09 0701 - 10/10 0700 In: 735 [P.O.:480; IV Piggyback:255] Out: -  Intake/Output this shift: No intake/output data recorded.  PE: General: pleasant, WD, WN female who is laying in bed in NAD Heart: regular, rate, and rhythm.   Lungs: Respiratory effort nonlabored Abd: soft, NT, ND GU: labial wound with some fibrinous exudate in wound, no purulent drainage, some improvement in surrounding erythema and induration   Lab Results:  Recent Labs    04/12/21 0214 04/13/21 0556  WBC 17.8* 15.0*  HGB 10.5* 10.4*  HCT 33.2* 33.2*  PLT 289 325   BMET Recent Labs    04/12/21 0214 04/13/21 0556  NA 134* 134*  K 4.4 4.7  CL 93* 94*  CO2 26 24  GLUCOSE 223* 95  BUN 40* 59*  CREATININE 5.07* 6.50*  CALCIUM 8.1* 7.7*   PT/INR No results for input(s): LABPROT, INR in the last 72 hours. CMP     Component Value Date/Time   NA 134 (L) 04/13/2021 0556   K 4.7 04/13/2021 0556   CL 94 (L) 04/13/2021 0556   CO2 24 04/13/2021 0556   GLUCOSE 95 04/13/2021 0556   BUN 59 (H) 04/13/2021 0556   CREATININE 6.50 (H) 04/13/2021 0556   CREATININE 0.68 10/04/2013 1418   CALCIUM 7.7 (L) 04/13/2021 0556   PROT  7.2 04/09/2021 0305   ALBUMIN 2.8 (L) 04/09/2021 0305   AST 12 (L) 04/09/2021 0305   ALT 9 04/09/2021 0305   ALKPHOS 126 04/09/2021 0305   BILITOT 0.6 04/09/2021 0305   GFRNONAA 7 (L) 04/13/2021 0556   GFRNONAA >89 10/04/2013 1418   GFRAA 10 (L) 03/14/2020 0628   GFRAA >89 10/04/2013 1418   Lipase     Component Value Date/Time   LIPASE 25 03/12/2020 0557       Studies/Results: No results found.  Anti-infectives: Anti-infectives (From admission, onward)    Start     Dose/Rate Route Frequency Ordered Stop   04/13/21 1600  ceFAZolin (ANCEF) IVPB 1 g/50 mL premix        1 g 100 mL/hr over 30 Minutes Intravenous Every 24 hours 04/13/21 0821     04/10/21 1200  vancomycin (VANCOCIN) IVPB 750 mg/150 ml premix  Status:  Discontinued        750 mg 150 mL/hr over 60 Minutes Intravenous Every M-W-F (Hemodialysis) 04/09/21 0127 04/13/21 0732   04/09/21 2200  cefTRIAXone (ROCEPHIN) 1 g in sodium chloride 0.9 % 100 mL IVPB  Status:  Discontinued        1 g 200 mL/hr over 30 Minutes Intravenous Every 24 hours 04/09/21 0101 04/09/21 1400   04/09/21 1430  piperacillin-tazobactam (  ZOSYN) IVPB 2.25 g  Status:  Discontinued        2.25 g 100 mL/hr over 30 Minutes Intravenous Every 8 hours 04/09/21 1400 04/13/21 0821   04/09/21 1100  metroNIDAZOLE (FLAGYL) IVPB 500 mg  Status:  Discontinued        500 mg 100 mL/hr over 60 Minutes Intravenous Every 8 hours 04/09/21 1040 04/09/21 1400   04/09/21 0045  vancomycin (VANCOREADY) IVPB 1250 mg/250 mL        1,250 mg 166.7 mL/hr over 90 Minutes Intravenous  Once 04/09/21 0033 04/09/21 0505   04/09/21 0015  cefTRIAXone (ROCEPHIN) 1 g in sodium chloride 0.9 % 100 mL IVPB        1 g 200 mL/hr over 30 Minutes Intravenous  Once 04/09/21 0007 04/09/21 0142        Assessment/Plan Right labial abscess S/P I&D 10/7 - BID dressing changes - WBC trending down and is 15K today, cx with proteus mirabilis that is pan-sensitive - ok to transition to PO  abx, would recommend another 5 days  - ok to mobilize as tolerated - recommend GYN follow up for wound care upon discharge - general surgery will sign off at this time, please call if we can be of further assistance     FEN - renal CM diet  VTE - SQH ID - ancef   ESRD on HD DM HTN HLD Chronic vaginitis (per care everywhere)  LOS: 4 days    Norm Parcel, [facility] Surgery 04/13/2021, 9:02 AM Please see Amion for pager number during day hours 7:00am-4:30pm

## 2021-04-13 NOTE — Plan of Care (Signed)
  Problem: Activity: Goal: Risk for activity intolerance will decrease Outcome: Progressing   Problem: Nutrition: Goal: Adequate nutrition will be maintained Outcome: Progressing   Problem: Coping: Goal: Level of anxiety will decrease Outcome: Progressing   Problem: Elimination: Goal: Will not experience complications related to bowel motility Outcome: Progressing   Problem: Safety: Goal: Ability to remain free from injury will improve Outcome: Progressing   

## 2021-04-13 NOTE — Progress Notes (Signed)
Patient off the unit to dialysis.

## 2021-04-13 NOTE — Progress Notes (Signed)
Bp Rechecked.

## 2021-04-13 NOTE — Progress Notes (Signed)
[facility] Progress Note  Subjective: seen in room, patient reports feeling better, no complaints.  Vitals:   04/12/21 1214 04/12/21 1702 04/12/21 2100 04/13/21 0020  BP: (!) 165/72  130/60 (!) 142/62  Pulse: (!) 59  62 62  Resp: '20  13 15  '$ Temp: 97.6 F (36.4 C) 98.1 F (36.7 C) 98.4 F (36.9 C) 98.6 F (37 C)  TempSrc: Oral Oral Oral Oral  SpO2:   99% 100%  Weight:      Height:        Exam: Gen: NAD CV: s1s2, rrr Resp: cta bl Abd: soft, nt/nd Ext: no edema Neuro: no deficits HD access: [facility]   Recent Labs  Lab 04/09/21 0305 04/10/21 0229 04/12/21 0214 04/13/21 0556  K 3.7   < > 4.4 4.7  BUN 15   < > 40* 59*  CREATININE 3.59*   < > 5.07* 6.50*  CALCIUM 8.8*   < > 8.1* 7.7*  PHOS 3.5  --   --   --   HGB 12.4   < > 10.5* 10.4*   < > = values in this interval not displayed.   Inpatient medications:  (feeding supplement) PROSource Plus  30 mL Oral BID BM   amLODipine  10 mg Oral Daily   Chlorhexidine Gluconate Cloth  6 each Topical Daily   heparin injection (subcutaneous)  5,000 Units Subcutaneous Q8H   hydrALAZINE  75 mg Oral Q8H   insulin aspart  0-6 Units Subcutaneous TID WC   insulin glargine-yfgn  8 Units Subcutaneous Daily   polyethylene glycol  17 g Oral Daily    piperacillin-tazobactam (ZOSYN)  IV 2.25 g (04/13/21 0541)   promethazine (PHENERGAN) injection (IM or IVPB) Stopped (04/09/21 2358)   bisacodyl, diphenhydrAMINE, hydrALAZINE, HYDROmorphone (DILAUDID) injection, naLOXone (NARCAN)  injection, oxyCODONE, promethazine **OR** promethazine (PHENERGAN) injection (IM or IVPB)   OP HD: MWF [facility] [place]  3.5h  350/600  61.5kg  2/2.5 bath  TDC  Heparin 5000 + 500u/hr  - no esa, Fe, VDRA  - HBsAg negative on 10/5   Assessment/ Plan: R labial abscess/cellulitis: on IV abx per pmd. Sp I&D by gen surg on 10/7  ESRD:  HD MWF. Next HD today  Hypertension/volume: BP decent, no edema on exam. At dry wt.   Anemia: Hgb 10-12- no ESA  needed.  Metabolic bone disease: Ca/Phos at goal, continue home binders.  Nutrition: Alb low, on protein supplements.  T2DM: per primary service  Gean Quint, MD [facility]

## 2021-04-13 NOTE — TOC Initial Note (Addendum)
Transition of Care [facility]) - Initial/Assessment Note    Patient Details  Name: [name] [name] MRN: [id number] Date of Birth: December 26, 1965  [facility] CM/SW Contact:    Joanne Chars, LCSW Phone Number: 04/13/2021, 2:00 PM  Clinical Narrative:  CSW met with pt regarding recommendation for outpt PT.  Pt reports that [facility] is currently in place and she was planning to transition for home care to outpt PT.  Discussed current need for dressing changes and pt aware that she will need further [facility] RN services.  Pt reports she also has [place] aide in place through [facility].  Discussed that Latricia Heft will not be able to come to the home to change dressing twice a day.   Pt aware she will need additional help with this.  Does not think her husband can help due to long work hours.  [facility] aide has told her that they cannot do "anything medical."  Permission given to speak with husband Mun[place].  PCP in place.  Current DME in home: walker, bedside commode, hoyer lift, electric chair.      1430: CSW message with Amy/[facility].  Confirmed that they are open for services with pt for RN/PT/OT.  RN comes Tues/Thurs.  Amy spoke with provider and they suggested SNF (pt refusing SNF)              Expected Discharge Plan: [place] Barriers to Discharge: Continued Medical Work up   Patient Goals and CMS Choice Patient states their goals for this hospitalization and ongoing recovery are:: "get back up walking and take care of myself" CMS Medicare.gov Compare Post Acute Care list provided to::  (NA-pt wants to continue with [facility])    Expected Discharge Plan and Services Expected Discharge Plan: [place] Choice: [place] arrangements for the past 2 months: Single Family Home                                      Prior Living Arrangements/Services Living arrangements for the past 2 months: Single Family  Home Lives with:: Spouse Patient language and need for interpreter reviewed:: Yes Do you feel safe going back to the place where you live?: Yes      Need for Family Participation in Patient Care: Yes (Comment) Care giver support system in place?: Yes (comment) Current home services: Homehealth aide, Home OT, Home PT, Home RN ([facility] and [facility] aide)) Criminal Activity/Legal Involvement Pertinent to Current Situation/Hospitalization: No - Comment as needed  Activities of Daily Living      Permission Sought/Granted Permission sought to share information with : Family Supports Permission granted to share information with : Yes, Verbal Permission Granted  Share Information with NAME: husband, Estate manager/land agent           Emotional Assessment Appearance:: Appears older than stated age Attitude/Demeanor/Rapport: Engaged Affect (typically observed): Appropriate, Pleasant Orientation: : Oriented to Self, Oriented to Place, Oriented to  Time, Oriented to Situation Alcohol / Substance Use: Not Applicable Psych Involvement: No (comment)  Admission diagnosis:  Cellulitis [L03.90] Cellulitis of pubic region [L03.319] Cellulitis, unspecified cellulitis site [L03.90] Patient Active Problem List   Diagnosis Date Noted   Cellulitis 04/09/2021   Cellulitis of suprapubic region 04/09/2021   Leukocytosis 04/09/2021   DM2 (diabetes mellitus, type 2) ([place]) 04/09/2021  Osteoporosis 10/13/2020   Fever    Closed fracture of left proximal tibia 10/10/2020   Fracture of femoral neck, right ([place]) 10/02/2020   Pressure injury of skin 03/12/2020   Sepsis ([place]) 03/07/2020   ESRD (end stage renal disease) ([place]) 03/07/2020   Hyperkalemia 03/07/2020   Acute cystitis 25/85/2778   Acute metabolic encephalopathy 24/23/5361   Impaired vision in both eyes 11/19/2015   Diabetes mellitus with complication (HCC)    Intractable headache    Syncope 07/28/2015   Chronic headaches 07/28/2015   Chest pain  05/22/2015   Urinary frequency 03/13/2015   Easy bruising 10/09/2014   Encounter for preventative adult health care exam with abnormal findings 01/11/2014   Chronic constipation 01/11/2014   GERD (gastroesophageal reflux disease) 44/31/5400   Helicobacter positive gastritis 12/06/2013   Dizziness and giddiness 10/12/2013   Essential hypertension 09/19/2013   Back pain 06/11/2013   Lumbosacral spondylosis without myelopathy 02/20/2013   Diabetic neuropathy ([place]) 01/09/2013   DM (diabetes mellitus), type 2, uncontrolled 10/02/2012   MITRAL REGURGITATION, mild 10/31/2009   PLEURAL EFFUSION, RIGHT after pyelonephritis in April 2011 (no intervention) 10/20/2009   PCP:  Myrtis Hopping., MD Pharmacy:   [facility] [phone] - [place], [address] AT [address] [place] [address] Phone: [phone] Fax: [phone]     Social Determinants of Health ([place]) Interventions    Readmission Risk Interventions Readmission Risk Prevention Plan 06/03/2020  Transportation Screening Complete  PCP or Specialist Appt within 3-5 Days Complete  HRI or Home Care Consult Complete  Social Work Consult for [place] Planning/Counseling Complete  Palliative Care Screening Not Applicable  Medication Review Press photographer) Complete  Some recent data might be hidden

## 2021-04-13 NOTE — Progress Notes (Signed)
Pt receives out-pt HD at [facility] in [place]. Pt goes MWF. Pt arrives around 11:30 for 11:50 chair time. Will follow and assist as needed.  Melven Sartorius Renal Navigator [phone]

## 2021-04-13 NOTE — Progress Notes (Signed)
04/13/2021  [employer] order routed to [employer] and I spoke personally to [place] from [facility]: order, going into the room to assess pt's skin and brainstorm what would work best for her foot drop.  Dr. Waldron Labs agreeable to what PT and [employer] rep decide works best.    Thanks,  Verdene Lennert, PT, DPT  Acute Rehabilitation Ortho Tech Supervisor [phone] pager [phone] office

## 2021-04-14 ENCOUNTER — Other Ambulatory Visit (HOSPITAL_COMMUNITY): Payer: Self-pay

## 2021-04-14 LAB — CULTURE, BLOOD (ROUTINE X 2)
Culture: NO GROWTH
Culture: NO GROWTH

## 2021-04-14 LAB — CBC
HCT: 32.2 % — ABNORMAL LOW (ref 36.0–46.0)
Hemoglobin: 10.3 g/dL — ABNORMAL LOW (ref 12.0–15.0)
MCH: 26.1 pg (ref 26.0–34.0)
MCHC: 32 g/dL (ref 30.0–36.0)
MCV: 81.7 fL (ref 80.0–100.0)
Platelets: 289 10*3/uL (ref 150–400)
RBC: 3.94 MIL/uL (ref 3.87–5.11)
RDW: 16.4 % — ABNORMAL HIGH (ref 11.5–15.5)
WBC: 12.4 10*3/uL — ABNORMAL HIGH (ref 4.0–10.5)
nRBC: 0 % (ref 0.0–0.2)

## 2021-04-14 LAB — GLUCOSE, CAPILLARY
Glucose-Capillary: 131 mg/dL — ABNORMAL HIGH (ref 70–99)
Glucose-Capillary: 131 mg/dL — ABNORMAL HIGH (ref 70–99)

## 2021-04-14 MED ORDER — AMOXICILLIN-POT CLAVULANATE 500-125 MG PO TABS
500.0000 mg | ORAL_TABLET | Freq: Two times a day (BID) | ORAL | 0 refills | Status: DC
  Filled 2021-04-14: qty 8, 4d supply, fill #0

## 2021-04-14 MED ORDER — HEPARIN SODIUM (PORCINE) 1000 UNIT/ML IJ SOLN
3800.0000 [IU] | Freq: Once | INTRAMUSCULAR | Status: AC
  Administered 2021-04-14: 3800 [IU] via INTRAVENOUS
  Filled 2021-04-14: qty 3.8

## 2021-04-14 NOTE — Discharge Summary (Signed)
Physician Discharge Summary  [name] [name] [id number] DOB: 02/21/1966 DOA: 04/08/2021  PCP: Myrtis Hopping., MD  Admit date: 04/08/2021 Discharge date: 04/14/2021  Admitted From: Home Disposition: (Home  Recommendations for Outpatient Follow-up:  Patient instructed to follow-up with her GYN within 1 week. Please obtain BMP/CBC in one week Continue her dialysis on her regular schedule Monday/Wednesday/Friday.  Home Health:YES  Discharge Condition:Stable CODE STATUS:FULL Diet recommendation: Renal/ Carb Modified   Brief/Interim Summary:  [name] [name] is a 55 y.o. female with medical history significant for end-stage renal disease on hemodialysis (M,W,F), type 2 diabetes mellitus, who is admitted to [facility] on 04/08/2021 with suprapubic cellulitis after presenting from home to [facility] ED complaining of abdominal erythema.  Her work-up was significant for suprapubic cellulitis with labial abscess, GYN consulted, who requested general surgery UA showing, patient s/p I&D by general surgery 10/7.  Right labial abscess/cellulitis -General surgery consult greatly appreciated, status post I&D, initially treated with vancomycin, Rocephin and Flagyl>> zosyn, intraoperative culture growing Proteus and Streptococcus, so she is transitioned to Augmentin, to finish another 4 days of antibiotic as an outpatient. -Blood cultures remains negative at time of discharge. -Leukocytosis trending down. -Continue with wound dressing twice daily as an outpatient, she was then instructed to follow-up with her GYN physician as an outpatient.   ESRD -Renal consult greatly appreciated, continue hemodialysis on her schedule Monday/Wednesday/Friday.   Hypertension -Blood pressure was labile during hospital stay requiring multiple adjustments, she will be discharged on her home regimen.   Type 2 diabetes mellitus -A1c is 6.1, diet controlled.  Discharge Diagnoses:  Principal Problem:   Cellulitis of  suprapubic region Active Problems:   ESRD (end stage renal disease) (HCC)   Leukocytosis   DM2 (diabetes mellitus, type 2) ([place])    Discharge Instructions  Discharge Instructions     Diet - low sodium heart healthy   Complete by: As directed    Discharge wound care:   Complete by: As directed    WOUND CARE: - dressing to be changed twice daily - supplies: sterile saline, kerlix/guaze, scissors, Pad or bried - remove dressing and all packing carefully, moistening with sterile saline as needed to avoid packing/internal dressing sticking to the wound. - clean edges of skin around the wound with water/gauze, making sure there is no tape debris or leakage left on skin that could cause skin irritation or breakdown. - dampen clean kerlix/gauze with sterile saline and pack wound from wound base to skin level, making sure to take note of any possible areas of wound tracking, tunneling and packing appropriately. Wound can be packed loosely. Trim kerlix/gauze to size if a whole roll/piece is not required. - cover wound with a dry dressing and brief/pad  - write the date/time on the dry dressing/tape to better track when the last dressing change occurred. - apply any skin protectant/powder recommended by clinician to protect skin/skin folds. - change dressing as needed if leakage occurs, wound gets contaminated, or patient requests to shower. - patient may shower daily with wound open and following the shower the wound should be dried and a clean dressing placed.   Increase activity slowly   Complete by: As directed       Allergies as of 04/14/2021       Reactions   Amitriptyline Swelling   Other reaction(s): Confusion (intolerance)   Ciprofloxacin Diarrhea, Nausea And Vomiting   Severe vomiting requiring ED visit, IV reglan and IV fluids   Semaglutide Nausea Only  Tramadol Itching   Tylenol [acetaminophen] Itching        Medication List     TAKE these medications     amoxicillin-clavulanate 500-125 MG tablet Commonly known as: AUGMENTIN Take 1 tablet (500 mg total) by mouth 2 (two) times daily.   BISACODYL PO Take 5 mg by mouth daily as needed (constipation).   fluconazole 100 MG tablet Commonly known as: DIFLUCAN Take 100 mg by mouth once a week.   gabapentin 100 MG capsule Commonly known as: NEURONTIN Take 100 mg by mouth daily as needed (pain).   hydrALAZINE 100 MG tablet Commonly known as: APRESOLINE Take 100 mg by mouth 3 (three) times daily.   lidocaine 5 % Commonly known as: LIDODERM Place 1 patch onto the skin daily. Remove & Discard patch within 12 hours or as directed by MD   methocarbamol 500 MG tablet Commonly known as: ROBAXIN TAKE 1 TABLET (500 MG TOTAL) BY MOUTH EVERY SIX HOURS AS NEEDED FOR MUSCLE SPASMS AND HEADACHE.   senna-docusate 8.6-50 MG tablet Commonly known as: Senokot-S Take 2 tablets by mouth 2 (two) times daily.   SUMAtriptan 100 MG tablet Commonly known as: IMITREX TAKE 1 TABLET (100 MG TOTAL) BY MOUTH ONCE AS NEEDED FOR MIGRAINE. MAY REPEAT IN 2 HOURS IF HEADACHE PERSISTS OR RECURS.   TUMS PO Take 1 Dose by mouth 3 (three) times daily with meals.               Discharge Care Instructions  (From admission, onward)           Start     Ordered   04/14/21 0000  Discharge wound care:       Comments: WOUND CARE: - dressing to be changed twice daily - supplies: sterile saline, kerlix/guaze, scissors, Pad or bried - remove dressing and all packing carefully, moistening with sterile saline as needed to avoid packing/internal dressing sticking to the wound. - clean edges of skin around the wound with water/gauze, making sure there is no tape debris or leakage left on skin that could cause skin irritation or breakdown. - dampen clean kerlix/gauze with sterile saline and pack wound from wound base to skin level, making sure to take note of any possible areas of wound tracking, tunneling and packing  appropriately. Wound can be packed loosely. Trim kerlix/gauze to size if a whole roll/piece is not required. - cover wound with a dry dressing and brief/pad  - write the date/time on the dry dressing/tape to better track when the last dressing change occurred. - apply any skin protectant/powder recommended by clinician to protect skin/skin folds. - change dressing as needed if leakage occurs, wound gets contaminated, or patient requests to shower. - patient may shower daily with wound open and following the shower the wound should be dried and a clean dressing placed.   04/14/21 1033            Follow-up Information     [facility] Follow up.   Specialty: [place] Why: This company, now called [facility], will contact you to schedule your first home visit. Contact information: [address] [place] [phone]         [facility] Follow up.   Why: [facility] will resume services on their regular schedule on Wed, 04/15/21. Contact information: [address], [place], [place] [address] P: [phone]        Myrtis Hopping., MD. Daphane Shepherd on 04/21/2021.  Specialty: Internal Medicine Why: Please attend your hospital follow up appointment with Grier Rocher PA on Tuesday, 04/21/21, at 1:30pm. Contact information: [id number] [place] [address] [phone]                Allergies  Allergen Reactions   Amitriptyline Swelling    Other reaction(s): Confusion (intolerance)   Ciprofloxacin Diarrhea and Nausea And Vomiting    Severe vomiting requiring ED visit, IV reglan and IV fluids     Semaglutide Nausea Only   Tramadol Itching   Tylenol [Acetaminophen] Itching    Consultations: Renal  General surgery   Procedures/Studies: CT ABDOMEN PELVIS WO CONTRAST  Result Date: 04/08/2021 CLINICAL DATA:  Abdominal pain, fever pubic swelling and labial swelling concerning for  abscess/infection - dialyzed today will start with non-con EXAM: CT ABDOMEN AND PELVIS WITHOUT CONTRAST TECHNIQUE: Multidetector CT imaging of the abdomen and pelvis was performed following the standard protocol without IV contrast. COMPARISON:  None. FINDINGS: Lower chest: No acute abnormality. Linear atelectasis versus scarring. Hepatobiliary: No focal liver abnormality. Status post cholecystectomy. No biliary dilatation. Pancreas: No focal lesion. Normal pancreatic contour. No surrounding inflammatory changes. No main pancreatic ductal dilatation. Spleen: Normal in size without focal abnormality. Adrenals/Urinary Tract: No adrenal nodule bilaterally. No nephrolithiasis and no hydronephrosis. No definite contour-deforming renal mass. No ureterolithiasis or hydroureter. Marked urinary bladder wall thickening with associated perivesicular fat stranding. Stomach/Bowel: Stomach is within normal limits. No evidence of bowel wall thickening or dilatation. Stool throughout the colon. Appendix appears normal. Vascular/Lymphatic: No abdominal aorta or iliac aneurysm. At least moderate atherosclerotic plaque of the aorta and its branches. Prominent but nonenlarged pelvic lymph nodes. No abdominal, pelvic, or inguinal lymphadenopathy. Reproductive: Status post hysterectomy. No adnexal masses. Other: No intraperitoneal free fluid. No intraperitoneal free gas. No organized fluid collection. Musculoskeletal: Tiny fat containing umbilical hernia. Mild subcutaneus soft tissue edema. Subcutaneus soft tissue fat stranding within the right labia majora with query of an approximately 2 cm underlying organized fluid collection versus phlegmon formation (6:37, 3:85, 7:63). No suspicious lytic or blastic osseous lesions. No acute displaced fracture. Surgical hardware within the right proximal femur. IMPRESSION: 1. Urinary bladder findings suggestive of infection. Correlate with urinalysis. 2. Subcutaneus soft tissue fat stranding within  the right labia majora with query of an approximately 2 cm underlying organized fluid collection versus phlegmon formation. Markedly limited evaluation on this noncontrast study. 3. Constipation. Electronically Signed   By: Iven Finn M.D.   On: 04/08/2021 23:50      Subjective: No Significant events overnight as discussed with staff, her pain is controlled.  Discharge Exam: Vitals:   04/14/21 0645 04/14/21 0807  BP: (!) 181/76 (!) 157/63  Pulse: 72 69  Resp: 10 13  Temp:  98.2 F (36.8 C)  SpO2: 100% 100%   Vitals:   04/14/21 0110 04/14/21 0500 04/14/21 0645 04/14/21 0807  BP: (!) 154/84  (!) 181/76 (!) 157/63  Pulse: 83  72 69  Resp: '14  10 13  '$ Temp:    98.2 F (36.8 C)  TempSrc:    Oral  SpO2:   100% 100%  Weight:  67.6 kg    Height:        General: Pt is alert, awake, not in acute distress Cardiovascular: RRR, S1/S2 +, no rubs, no gallops Respiratory: CTA bilaterally, no wheezing, no rhonchi Abdominal: Soft, NT, ND, bowel sounds + Extremities: no edema, no cyanosis    The results of significant diagnostics from this  hospitalization (including imaging, microbiology, ancillary and laboratory) are listed below for reference.     Microbiology: Recent Results (from the past 240 hour(s))  Blood culture (routine x 2)     Status: None   Collection Time: 04/08/21 10:40 PM   Specimen: BLOOD  Result Value Ref Range Status   Specimen Description BLOOD BLOOD LEFT FOREARM  Final   Special Requests   Final    BOTTLES DRAWN AEROBIC AND ANAEROBIC Blood Culture results may not be optimal due to an inadequate volume of blood received in culture bottles   Culture   Final    NO GROWTH 5 DAYS Performed at [facility], [place] [address] [place], [place] [address]    Report Status 04/14/2021 FINAL  Final  Resp Panel by RT-PCR (Flu A&B, Covid) Nasopharyngeal Swab     Status: None   Collection Time: 04/09/21 12:08 AM   Specimen: Nasopharyngeal Swab; Nasopharyngeal(NP)  swabs in vial transport medium  Result Value Ref Range Status   SARS Coronavirus 2 by RT PCR NEGATIVE NEGATIVE Final    Comment: (NOTE) SARS-CoV-2 target nucleic acids are NOT DETECTED.  The SARS-CoV-2 RNA is generally detectable in upper respiratory specimens during the acute phase of infection. The lowest concentration of SARS-CoV-2 viral copies this assay can detect is 138 copies/mL. A negative result does not preclude SARS-Cov-2 infection and should not be used as the sole basis for treatment or other patient management decisions. A negative result may occur with  improper specimen collection/handling, submission of specimen other than nasopharyngeal swab, presence of viral mutation(s) within the areas targeted by this assay, and inadequate number of viral copies(<138 copies/mL). A negative result must be combined with clinical observations, patient history, and epidemiological information. The expected result is Negative.  Fact Sheet for Patients:  EntrepreneurPulse.com.au  Fact Sheet for Healthcare Providers:  IncredibleEmployment.be  This test is no t yet approved or cleared by the [place] FDA and  has been authorized for detection and/or diagnosis of SARS-CoV-2 by FDA under an Emergency Use Authorization (EUA). This EUA will remain  in effect (meaning this test can be used) for the duration of the COVID-19 declaration under Section 564(b)(1) of the Act, 21 U.S.C.section 360bbb-3(b)(1), unless the authorization is terminated  or revoked sooner.       Influenza A by PCR NEGATIVE NEGATIVE Final   Influenza B by PCR NEGATIVE NEGATIVE Final    Comment: (NOTE) The Xpert Xpress SARS-CoV-2/FLU/RSV plus assay is intended as an aid in the diagnosis of influenza from Nasopharyngeal swab specimens and should not be used as a sole basis for treatment. Nasal washings and aspirates are unacceptable for Xpert Xpress  SARS-CoV-2/FLU/RSV testing.  Fact Sheet for Patients: EntrepreneurPulse.com.au  Fact Sheet for Healthcare Providers: IncredibleEmployment.be  This test is not yet approved or cleared by the [place] FDA and has been authorized for detection and/or diagnosis of SARS-CoV-2 by FDA under an Emergency Use Authorization (EUA). This EUA will remain in effect (meaning this test can be used) for the duration of the COVID-19 declaration under Section 564(b)(1) of the Act, 21 U.S.C. section 360bbb-3(b)(1), unless the authorization is terminated or revoked.  Performed at [facility], [place] [address], [place], [place] [address]   Blood culture (routine x 2)     Status: None   Collection Time: 04/09/21 12:11 AM   Specimen: BLOOD LEFT WRIST  Result Value Ref Range Status   Specimen Description BLOOD LEFT WRIST  Final   Special  Requests   Final    BOTTLES DRAWN AEROBIC AND ANAEROBIC Blood Culture results may not be optimal due to an inadequate volume of blood received in culture bottles   Culture   Final    NO GROWTH 5 DAYS Performed at [facility], [place] [address], [place], [place] [address]    Report Status 04/14/2021 FINAL  Final  Aerobic/Anaerobic Culture w Gram Stain (surgical/deep wound)     Status: None   Collection Time: 04/10/21  2:38 PM   Specimen: PATH Other; Body Fluid  Result Value Ref Range Status   Specimen Description WOUND  Final   Special Requests  RIGHT LABIAL ABCESS  Final   Gram Stain   Final    ABUNDANT WBC PRESENT,BOTH PMN AND MONONUCLEAR ABUNDANT GRAM POSITIVE COCCI MODERATE GRAM NEGATIVE RODS    Culture   Final    MODERATE PROTEUS MIRABILIS FEW STREPTOCOCCUS AGALACTIAE TESTING AGAINST S. AGALACTIAE NOT ROUTINELY PERFORMED DUE TO PREDICTABILITY OF AMP/PEN/VAN SUSCEPTIBILITY. FEW BACTEROIDES FRAGILIS BETA LACTAMASE POSITIVE Performed at [facility], [place] [address], [place], [place] [address]    Report  Status 04/13/2021 FINAL  Final   Organism ID, Bacteria PROTEUS MIRABILIS  Final      Susceptibility   Proteus mirabilis - MIC*    AMPICILLIN <=2 SENSITIVE Sensitive     CEFAZOLIN <=4 SENSITIVE Sensitive     CEFEPIME <=0.12 SENSITIVE Sensitive     CEFTAZIDIME 2 SENSITIVE Sensitive     CEFTRIAXONE <=0.25 SENSITIVE Sensitive     CIPROFLOXACIN <=0.25 SENSITIVE Sensitive     GENTAMICIN <=1 SENSITIVE Sensitive     IMIPENEM 4 SENSITIVE Sensitive     TRIMETH/SULFA <=20 SENSITIVE Sensitive     AMPICILLIN/SULBACTAM <=2 SENSITIVE Sensitive     PIP/TAZO <=4 SENSITIVE Sensitive     * MODERATE PROTEUS MIRABILIS     Labs: BNP (last 3 results) No results for input(s): BNP in the last 8760 hours. Basic Metabolic Panel: Recent Labs  Lab 04/09/21 0305 04/10/21 0229 04/11/21 0836 04/12/21 0214 04/13/21 0556  NA 139 136 132* 134* 134*  K 3.7 4.8 4.2 4.4 4.7  CL 99 95* 94* 93* 94*  CO2 '26 24 26 26 24  '$ GLUCOSE 95 114* 350* 223* 95  BUN 15 27* 27* 40* 59*  CREATININE 3.59* 4.73* 4.39* 5.07* 6.50*  CALCIUM 8.8* 8.7* 8.3* 8.1* 7.7*  MG 1.9  --   --   --   --   PHOS 3.5  --   --   --   --    Liver Function Tests: Recent Labs  Lab 04/09/21 0200 04/09/21 0305  AST 13* 12*  ALT 9 9  ALKPHOS 151* 126  BILITOT 0.6 0.6  PROT 7.1 7.2  ALBUMIN 2.8* 2.8*   No results for input(s): LIPASE, AMYLASE in the last 168 hours. No results for input(s): AMMONIA in the last 168 hours. CBC: Recent Labs  Lab 04/08/21 2200 04/09/21 0305 04/10/21 0229 04/11/21 0836 04/12/21 0214 04/13/21 0556 04/14/21 0156  WBC 24.5* 24.8* 25.4* 25.1* 17.8* 15.0* 12.4*  NEUTROABS 15.4* 16.6* 19.4*  --   --   --   --   HGB 12.8 12.4 11.1* 10.8* 10.5* 10.4* 10.3*  HCT 40.1 41.7 35.9* 34.4* 33.2* 33.2* 32.2*  MCV 84.2 88.9 84.7 85.4 84.9 83.8 81.7  PLT 381 300 238 310 289 325 289   Cardiac Enzymes: No results for input(s): CKTOTAL, CKMB, CKMBINDEX, TROPONINI in the last 168 hours. BNP: Invalid input(s):  POCBNP  CBG: Recent Labs  Lab 04/13/21 0802 04/13/21 1128 04/13/21 1619 04/13/21 2030 04/14/21 0730  GLUCAP 103* 95 84 108* 131*   D-Dimer No results for input(s): DDIMER in the last 72 hours. Hgb A1c No results for input(s): HGBA1C in the last 72 hours. Lipid Profile No results for input(s): CHOL, HDL, LDLCALC, TRIG, CHOLHDL, LDLDIRECT in the last 72 hours. Thyroid function studies No results for input(s): TSH, T4TOTAL, T3FREE, THYROIDAB in the last 72 hours.  Invalid input(s): FREET3 Anemia work up No results for input(s): VITAMINB12, FOLATE, FERRITIN, TIBC, IRON, RETICCTPCT in the last 72 hours. Urinalysis    Component Value Date/Time   COLORURINE STRAW (A) 03/07/2020 1123   APPEARANCEUR TURBID (A) 03/07/2020 1123   LABSPEC 1.019 03/07/2020 1123   PHURINE 7.0 03/07/2020 1123   GLUCOSEU NEGATIVE 03/07/2020 1123   GLUCOSEU >=1000 (A) 11/19/2015 1505   HGBUR SMALL (A) 03/07/2020 1123   BILIRUBINUR NEGATIVE 03/07/2020 1123   BILIRUBINUR neg 03/13/2015 1858   KETONESUR NEGATIVE 03/07/2020 1123   PROTEINUR >=300 (A) 03/07/2020 1123   UROBILINOGEN 0.2 11/19/2015 1505   NITRITE NEGATIVE 03/07/2020 1123   LEUKOCYTESUR MODERATE (A) 03/07/2020 1123   Sepsis Labs Invalid input(s): PROCALCITONIN,  WBC,  LACTICIDVEN Microbiology Recent Results (from the past 240 hour(s))  Blood culture (routine x 2)     Status: None   Collection Time: 04/08/21 10:40 PM   Specimen: BLOOD  Result Value Ref Range Status   Specimen Description BLOOD BLOOD LEFT FOREARM  Final   Special Requests   Final    BOTTLES DRAWN AEROBIC AND ANAEROBIC Blood Culture results may not be optimal due to an inadequate volume of blood received in culture bottles   Culture   Final    NO GROWTH 5 DAYS Performed at [facility], [place] [address], [place], [place] [address]    Report Status 04/14/2021 FINAL  Final  Resp Panel by RT-PCR (Flu A&B, Covid) Nasopharyngeal Swab     Status: None   Collection Time:  04/09/21 12:08 AM   Specimen: Nasopharyngeal Swab; Nasopharyngeal(NP) swabs in vial transport medium  Result Value Ref Range Status   SARS Coronavirus 2 by RT PCR NEGATIVE NEGATIVE Final    Comment: (NOTE) SARS-CoV-2 target nucleic acids are NOT DETECTED.  The SARS-CoV-2 RNA is generally detectable in upper respiratory specimens during the acute phase of infection. The lowest concentration of SARS-CoV-2 viral copies this assay can detect is 138 copies/mL. A negative result does not preclude SARS-Cov-2 infection and should not be used as the sole basis for treatment or other patient management decisions. A negative result may occur with  improper specimen collection/handling, submission of specimen other than nasopharyngeal swab, presence of viral mutation(s) within the areas targeted by this assay, and inadequate number of viral copies(<138 copies/mL). A negative result must be combined with clinical observations, patient history, and epidemiological information. The expected result is Negative.  Fact Sheet for Patients:  EntrepreneurPulse.com.au  Fact Sheet for Healthcare Providers:  IncredibleEmployment.be  This test is no t yet approved or cleared by the [place] FDA and  has been authorized for detection and/or diagnosis of SARS-CoV-2 by FDA under an Emergency Use Authorization (EUA). This EUA will remain  in effect (meaning this test can be used) for the duration of the COVID-19 declaration under Section 564(b)(1) of the Act, 21 U.S.C.section 360bbb-3(b)(1), unless the authorization is terminated  or revoked sooner.       Influenza A by PCR NEGATIVE NEGATIVE Final  Influenza B by PCR NEGATIVE NEGATIVE Final    Comment: (NOTE) The Xpert Xpress SARS-CoV-2/FLU/RSV plus assay is intended as an aid in the diagnosis of influenza from Nasopharyngeal swab specimens and should not be used as a sole basis for treatment. Nasal washings  and aspirates are unacceptable for Xpert Xpress SARS-CoV-2/FLU/RSV testing.  Fact Sheet for Patients: EntrepreneurPulse.com.au  Fact Sheet for Healthcare Providers: IncredibleEmployment.be  This test is not yet approved or cleared by the [place] FDA and has been authorized for detection and/or diagnosis of SARS-CoV-2 by FDA under an Emergency Use Authorization (EUA). This EUA will remain in effect (meaning this test can be used) for the duration of the COVID-19 declaration under Section 564(b)(1) of the Act, 21 U.S.C. section 360bbb-3(b)(1), unless the authorization is terminated or revoked.  Performed at [facility], [place] [address], [place], [place] [address]   Blood culture (routine x 2)     Status: None   Collection Time: 04/09/21 12:11 AM   Specimen: BLOOD LEFT WRIST  Result Value Ref Range Status   Specimen Description BLOOD LEFT WRIST  Final   Special Requests   Final    BOTTLES DRAWN AEROBIC AND ANAEROBIC Blood Culture results may not be optimal due to an inadequate volume of blood received in culture bottles   Culture   Final    NO GROWTH 5 DAYS Performed at [facility], [place] [address] [place], [place] [address]    Report Status 04/14/2021 FINAL  Final  Aerobic/Anaerobic Culture w Gram Stain (surgical/deep wound)     Status: None   Collection Time: 04/10/21  2:38 PM   Specimen: PATH Other; Body Fluid  Result Value Ref Range Status   Specimen Description WOUND  Final   Special Requests  RIGHT LABIAL ABCESS  Final   Gram Stain   Final    ABUNDANT WBC PRESENT,BOTH PMN AND MONONUCLEAR ABUNDANT GRAM POSITIVE COCCI MODERATE GRAM NEGATIVE RODS    Culture   Final    MODERATE PROTEUS MIRABILIS FEW STREPTOCOCCUS AGALACTIAE TESTING AGAINST S. AGALACTIAE NOT ROUTINELY PERFORMED DUE TO PREDICTABILITY OF AMP/PEN/VAN SUSCEPTIBILITY. FEW BACTEROIDES FRAGILIS BETA LACTAMASE POSITIVE Performed at [facility],  [place] [address] [place], [place] [address]    Report Status 04/13/2021 FINAL  Final   Organism ID, Bacteria PROTEUS MIRABILIS  Final      Susceptibility   Proteus mirabilis - MIC*    AMPICILLIN <=2 SENSITIVE Sensitive     CEFAZOLIN <=4 SENSITIVE Sensitive     CEFEPIME <=0.12 SENSITIVE Sensitive     CEFTAZIDIME 2 SENSITIVE Sensitive     CEFTRIAXONE <=0.25 SENSITIVE Sensitive     CIPROFLOXACIN <=0.25 SENSITIVE Sensitive     GENTAMICIN <=1 SENSITIVE Sensitive     IMIPENEM 4 SENSITIVE Sensitive     TRIMETH/SULFA <=20 SENSITIVE Sensitive     AMPICILLIN/SULBACTAM <=2 SENSITIVE Sensitive     PIP/TAZO <=4 SENSITIVE Sensitive     * MODERATE PROTEUS MIRABILIS     Time coordinating discharge: Over 30 minutes  SIGNED:   Phillips Climes, MD  [facility] 04/14/2021, 11:27 AM Pager   If 7PM-7AM, please contact night-coverage [email or web address] Password [email or web address]

## 2021-04-14 NOTE — TOC Progression Note (Addendum)
Transition of Care [facility]) - Progression Note    Patient Details  Name: [name] [name] MRN: [id number] Date of Birth: 03-10-1966  Transition of Care [facility]) CM/SW Contact  Joanne Chars, LCSW Phone Number: 04/14/2021, 11:20 AM  Clinical Narrative:   CSW spoke with pt regarding [facility] RN coming Tues/Thurs.  Pt has spoken to her aid at [facility], Laswanda, who can also assist with dressing changes.  Per pt, aid comes on HD days, MWF, before she goes to HD for an hour and then also returns for 2 hours after HD.  On non HD days, T and Th, she is there in the morning.  Pt continues to decline SNF referral.    CSW spoke with Tawana at [facility] who confirmed [facility] aide schedule, will restart services tomorrow.  Current address is [place], [place].    CSW informed Amy/[facility] of pt DC today and that pt continues decline SNF.  Hospital DC appt scheduled with Dr Lajoyce Corners office.  CSW confirmed with renal navigator Olivia Mackie that pt is confirmed for HD resuming tomorrow.  CSW spoke with pt, she will contact her HD transportation for resumption tomorrow and will also arrange transportation for hospital DC appt.       Expected Discharge Plan: [place] Barriers to Discharge: Continued Medical Work up  Expected Discharge Plan and Services Expected Discharge Plan: [place] Choice: [place] arrangements for the past 2 months: Single Family Home Expected Discharge Date: 04/14/21                                     Social Determinants of Health (SDOH) Interventions    Readmission Risk Interventions Readmission Risk Prevention Plan 06/03/2020  Transportation Screening Complete  PCP or Specialist Appt within 3-5 Days Complete  HRI or [place] Complete  Social Work Consult for [place] Planning/Counseling Complete  Palliative Care Screening Not Applicable  Medication Review Designer, fashion/clothing) Complete  Some recent data might be hidden

## 2021-04-14 NOTE — Progress Notes (Signed)
Pt is back on unit from dialysis.

## 2021-04-14 NOTE — Evaluation (Signed)
Occupational Therapy Evaluation Patient Details Name: [name] [name] MRN: [id number] DOB: 02/19/1966 Today's Date: 04/14/2021   History of Present Illness 55yo female who presented on 10/6 with labial pain. Found to have R labial abscess and received I&D 10/7. PMH DM with neuropathy, ESRD on HD, R femoral neck fx s/p cannulated hip pinning, HTN, syncope and collapse, L tibial ORIF   Clinical Impression   PTA patient reports needing assist from aide for hoyer lift transfers, toileting (brief changes after BM from bed level), and bathing/dressing from bed (pt completes upper/aide assist with lower).  She was admitted for above and present with problem list below, including weakness, decreased activity tolerance.  She requires total assist +2 to reposition in bed, presents at baseline for ADLs.  Based on performance today, believe pt will benefit from continued [place] services at discharge and pt has no further acute OT needs at this time.       Recommendations for follow up therapy are one component of a multi-disciplinary discharge planning process, led by the attending physician.  Recommendations may be updated based on patient status, additional functional criteria and insurance authorization.   Follow Up Recommendations  Home health OT;Supervision/Assistance - 24 hour    Equipment Recommendations  None recommended by OT    Recommendations for Other Services       Precautions / Restrictions Precautions Precautions: Fall;Other (comment) Precaution Comments: impaired vision, B plantar flexion contractures, groin wound Restrictions Weight Bearing Restrictions: No      Mobility Bed Mobility Overal bed mobility: Needs Assistance             General bed mobility comments: refused all bed mobility today except for repositioning in bed, for which she required totalAx2    Transfers                 General transfer comment: refused    Balance                                            ADL either performed or assessed with clinical judgement   ADL Overall ADL's : At baseline                                       General ADL Comments: pt appears at baseline for ADLs, setup for UB at bed level and assist for LB; using briefs for toielting bed level changing from aides assist     Vision Ability to See in Adequate Light: 1 Impaired Patient Visual Report: No change from baseline       Perception     Praxis      Pertinent Vitals/Pain Pain Assessment: Faces Pain Score: 9  Faces Pain Scale: Hurts a little bit Pain Location: generalized discomfort Pain Descriptors / Indicators: Aching;Discomfort Pain Intervention(s): Limited activity within patient's tolerance;Monitored during session;Repositioned     Hand Dominance Right   Extremity/Trunk Assessment Upper Extremity Assessment Upper Extremity Assessment: Generalized weakness   Lower Extremity Assessment Lower Extremity Assessment: Defer to PT evaluation   Cervical / Trunk Assessment Cervical / Trunk Assessment: Kyphotic   Communication Communication Communication: No difficulties   Cognition Arousal/Alertness: Awake/alert Behavior During Therapy: WFL for tasks assessed/performed Overall Cognitive Status: Within Functional Limits for tasks assessed  General Comments: appears WFL for tasks assessed, limited assessment though (PT reports different PLOF/home setup reported today compared to yesterday)   General Comments  refused OOB or EOB today    Exercises     Shoulder Instructions      Home Living Family/patient expects to be discharged to:: Private residence Living Arrangements: Spouse/significant other Available Help at Discharge: Family;Available PRN/intermittently;Personal care attendant Type of Home: House Home Access: Ramped entrance     Home Layout: One level     Bathroom Shower/Tub:  Teacher, early years/pre: Standard     Home Equipment: Bedside commode;Wheelchair - power;Shower seat;Hospital bed;Walker - 2 wheels;Other (comment) (hoyer lift)   Additional Comments: bathroom in not accessible to walker or w/c, sponge bathes and uses BSC      Prior Functioning/Environment Level of Independence: Needs assistance  Gait / Transfers Assistance Needed: having a hard time with transfers- getting ready to go to to OP. Needs "a LOT" of help for OOB and transfers. Assist from aide to use hoyer lift for all transfers. ADL's / Homemaking Assistance Needed: bathing and dressing from bed level aide helps with cleaning backside, reports using brief for toileting and changing in the bed (aide 2 hrs in am and 2 in pm)            OT Problem List: Decreased strength;Decreased activity tolerance;Impaired balance (sitting and/or standing);Decreased safety awareness;Decreased knowledge of use of DME or AE;Decreased knowledge of precautions;Pain      OT Treatment/Interventions:      OT Goals(Current goals can be found in the care plan section) Acute Rehab OT Goals Patient Stated Goal: to get home and get better OT Goal Formulation: With patient  OT Frequency:     Barriers to D/C:            Co-evaluation PT/OT/SLP Co-Evaluation/Treatment: Yes Reason for Co-Treatment: For patient/therapist safety;To address functional/ADL transfers   OT goals addressed during session: ADL's and self-care      AM-PAC OT "6 Clicks" Daily Activity     Outcome Measure Help from another person eating meals?: A Little Help from another person taking care of personal grooming?: A Little Help from another person toileting, which includes using toliet, bedpan, or urinal?: Total Help from another person bathing (including washing, rinsing, drying)?: A Lot Help from another person to put on and taking off regular upper body clothing?: A Little Help from another person to put on and taking  off regular lower body clothing?: Total 6 Click Score: 13   End of Session Nurse Communication: Mobility status  Activity Tolerance: Patient tolerated treatment well Patient left: in bed;with call bell/phone within reach;with bed alarm set  OT Visit Diagnosis: Muscle weakness (generalized) (M62.81);Pain Pain - part of body:  (generalized)                Time: [id number] OT Time Calculation (min): 17 min Charges:  OT General Charges $OT Visit: 1 Visit OT Evaluation $OT Eval Low Complexity: 1 Low  Jolaine Artist, OT Acute Rehabilitation Services Pager [phone] Office [phone]   Delight Stare 04/14/2021, 10:27 AM

## 2021-04-14 NOTE — Progress Notes (Signed)
[facility] Progress Note  Subjective: seen in room, no complaints. Tolerated HD yesterday, net uf 1.5L  Vitals:   04/14/21 0015 04/14/21 0110 04/14/21 0500 04/14/21 0645  BP: 138/84 (!) 154/84  (!) 181/76  Pulse: 73 83  72  Resp: '11 14  10  '$ Temp: 98 F (36.7 C)     TempSrc: Oral     SpO2: 100%   100%  Weight: 64.5 kg  67.6 kg   Height:        Exam: Gen: NAD CV: s1s2, rrr Resp: cta bl Abd: soft, nt/nd Ext: no edema Neuro: no deficits HD access: [facility]   Recent Labs  Lab 04/09/21 0305 04/10/21 0229 04/12/21 0214 04/13/21 0556 04/14/21 0156  K 3.7   < > 4.4 4.7  --   BUN 15   < > 40* 59*  --   CREATININE 3.59*   < > 5.07* 6.50*  --   CALCIUM 8.8*   < > 8.1* 7.7*  --   PHOS 3.5  --   --   --   --   HGB 12.4   < > 10.5* 10.4* 10.3*   < > = values in this interval not displayed.   Inpatient medications:  (feeding supplement) PROSource Plus  30 mL Oral BID BM   amLODipine  10 mg Oral Daily   amoxicillin-clavulanate  500 mg Oral BID   Chlorhexidine Gluconate Cloth  6 each Topical Daily   heparin injection (subcutaneous)  5,000 Units Subcutaneous Q8H   hydrALAZINE  100 mg Oral Q8H   insulin aspart  0-6 Units Subcutaneous TID WC   insulin glargine-yfgn  8 Units Subcutaneous Daily   polyethylene glycol  17 g Oral Daily    promethazine (PHENERGAN) injection (IM or IVPB) Stopped (04/09/21 2358)   bisacodyl, diphenhydrAMINE, hydrALAZINE, HYDROmorphone (DILAUDID) injection, naLOXone (NARCAN)  injection, oxyCODONE, promethazine **OR** promethazine (PHENERGAN) injection (IM or IVPB)   OP HD: MWF [facility]  3.5h  350/600  61.5kg  2/2.5 bath  TDC  Heparin 5000 + 500u/hr  - no esa, Fe, VDRA  - HBsAg negative on 10/5   Assessment/ Plan: R labial abscess/cellulitis: on IV abx per pmd. Sp I&D by gen surg on 10/7  ESRD:  HD MWF. Next HD 10/12  Hypertension/volume: BP decent, no edema on exam. Over edw per charting, UF as tolerated  Anemia: Hgb 10-12- no  ESA needed.  Metabolic bone disease: Ca/Phos at goal, continue home binders.  Nutrition: Alb low, on protein supplements.  T2DM: per primary service  Gean Quint, MD [facility]

## 2021-04-14 NOTE — Progress Notes (Signed)
[facility] in [place]. Clinic advised of pt's d/c today and to resume care tomorrow. Clinic has access to obtain needed d/c paperwork therefore no documents faxed.  Melven Sartorius Renal Navigator  [phone]

## 2021-04-14 NOTE — TOC Transition Note (Signed)
Transition of Care [facility]) - CM/SW Discharge Note   Patient Details  Name: [name] [name] MRN: [id number] Date of Birth: 1965/09/15  Transition of Care [facility]) CM/SW Contact:  Joanne Chars, LCSW Phone Number: 04/14/2021, 12:01 PM   Clinical Narrative:   Pt discharging home with [facility].  [facility] also providing [facility] aide service.  Meds through [facility].  Pt will return home via [facility].  See today's TOC progression note for more details.  No other needs identified.      Final next level of care: [place] Barriers to Discharge: Barriers Resolved   Patient Goals and CMS Choice Patient states their goals for this hospitalization and ongoing recovery are:: "get back up walking and take care of myself" CMS Medicare.gov Compare Post Acute Care list provided to::  (NA-pt wants to continue with [facility])    Discharge Placement                       Discharge Plan and Services     Post Acute Care Choice: Home Health          DME Arranged: N/A         HH Arranged: PT, OT, RN [facility] Agency: [place] Date [facility] Agency Contacted: 04/14/21 Time [place]: 1130 Representative spoke with at [place]: [place] ([place]) Interventions     Readmission Risk Interventions Readmission Risk Prevention Plan 06/03/2020  Transportation Screening Complete  PCP or Specialist Appt within 3-5 Days Complete  HRI or [place] Complete  Social Work Consult for [place] Planning/Counseling Complete  Palliative Care Screening Not Applicable  Medication Review Press photographer) Complete  Some recent data might be hidden

## 2021-04-14 NOTE — Progress Notes (Signed)
Physical Therapy Treatment Patient Details Name: [name] [name] MRN: [id number] DOB: 1965-10-06 Today's Date: 04/14/2021   History of Present Illness 55yo female who presented on 10/6 with labial pain. Found to have R labial abscess and received I&D 10/7. PMH DM with neuropathy, ESRD on HD, R femoral neck fx s/p cannulated hip pinning, HTN, syncope and collapse, L tibial ORIF    PT Comments    Patient received in bed; refused all mobility today but did allow PT to perform skin check from new boots/foot splints. No skin redness or irritation noted, had been wearing them since last night due to going to HD. Perseverates on being allergic to material of boots, difficult to redirect and no signs of localized allergic reaction or contact dermatitis. Education provided on boot wear schedule at home and importance of skin checks. Needed totalAx2 for repositioning in bed. Left positioned to comfort with all needs met- at this point would be better served by return to [place].     Recommendations for follow up therapy are one component of a multi-disciplinary discharge planning process, led by the attending physician.  Recommendations may be updated based on patient status, additional functional criteria and insurance authorization.  Follow Up Recommendations  Home health PT;Supervision for mobility/OOB     Equipment Recommendations  Other (comment) (well equipped)    Recommendations for Other Services       Precautions / Restrictions Precautions Precautions: Fall;Other (comment) Precaution Comments: impaired vision, B plantar flexion contractures, groin wound Restrictions Weight Bearing Restrictions: No     Mobility  Bed Mobility               General bed mobility comments: refused all bed mobility today except for repositioning in bed, for which she required totalAx2    Transfers                 General transfer comment: refused  Ambulation/Gait             General  Gait Details: non-ambulatory, transfers to power chair historically   Stairs             Wheelchair Mobility    Modified Rankin (Stroke Patients Only)       Balance                                            Cognition Arousal/Alertness: Awake/alert Behavior During Therapy: WFL for tasks assessed/performed Overall Cognitive Status: Within Functional Limits for tasks assessed                                 General Comments: but question STM as some details of home life/home mobilty were different than yesterday      Exercises      General Comments General comments (skin integrity, edema, etc.): refused EOB/OOB today      Pertinent Vitals/Pain Pain Assessment: Faces Faces Pain Scale: Hurts a little bit Pain Location: generalized discomfort Pain Descriptors / Indicators: Aching;Discomfort Pain Intervention(s): Limited activity within patient's tolerance;Monitored during session    Home Living Family/patient expects to be discharged to:: Private residence Living Arrangements: Spouse/significant other Available Help at Discharge: Family;Available PRN/intermittently;Personal care attendant Type of Home: House Home Access: Ramped entrance   Home Layout: One level Home Equipment: Bedside commode;Wheelchair - power;Shower seat;Hospital bed;Walker - 2 wheels;Other (  comment) (hoyer lift) Additional Comments: bathroom in not accessible to walker or w/c, sponge bathes and uses BSC    Prior Function Level of Independence: Needs assistance  Gait / Transfers Assistance Needed: having a hard time with transfers- getting ready to go to to OP. Needs "a LOT" of help for OOB and transfers. Had been doing lateral scoots. ADL's / Homemaking Assistance Needed: bathing and dressing from bed level aide helps with cleaning backside, reports using brief for toileting and changing in the bed (aide 2 hrs in am and 2 in pm)     PT Goals (current goals can  now be found in the care plan section) Acute Rehab PT Goals Patient Stated Goal: go to OP and really work on getting better with mobility PT Goal Formulation: With patient Time For Goal Achievement: 04/27/21 Potential to Achieve Goals: Fair Progress towards PT goals: Not progressing toward goals - comment (self limiting today)    Frequency    Min 3X/week      PT Plan Discharge plan needs to be updated    Co-evaluation              AM-PAC PT "6 Clicks" Mobility   Outcome Measure  Help needed turning from your back to your side while in a flat bed without using bedrails?: A Lot Help needed moving from lying on your back to sitting on the side of a flat bed without using bedrails?: A Lot Help needed moving to and from a bed to a chair (including a wheelchair)?: Total Help needed standing up from a chair using your arms (e.g., wheelchair or bedside chair)?: Total Help needed to walk in hospital room?: Total Help needed climbing 3-5 steps with a railing? : Total 6 Click Score: 8    End of Session   Activity Tolerance: Patient tolerated treatment well Patient left: in bed;with call bell/phone within reach Nurse Communication: Mobility status PT Visit Diagnosis: Muscle weakness (generalized) (M62.81);Other abnormalities of gait and mobility (R26.89);Pain Pain - Right/Left:  (central) Pain - part of body:  (groin)     Time: 3606-7703 PT Time Calculation (min) (ACUTE ONLY): 15 min  Charges:  $Self Care/Home Management: 8-22 (co-tx with OT)                    Ann Lions PT, DPT, PN2   Supplemental Physical Therapist [place]    Pager [phone] Acute Rehab Office [phone]

## 2022-05-19 ENCOUNTER — Other Ambulatory Visit: Payer: Self-pay

## 2022-05-19 ENCOUNTER — Inpatient Hospital Stay (HOSPITAL_COMMUNITY)
Admission: EM | Admit: 2022-05-19 | Discharge: 2022-05-22 | DRG: 304 | Disposition: A | Discharge status: Home / Self Care | Payer: Medicare Other | Attending: Internal Medicine | Admitting: Internal Medicine

## 2022-05-19 DIAGNOSIS — Z833 Family history of diabetes mellitus: Secondary | ICD-10-CM

## 2022-05-19 DIAGNOSIS — I69398 Other sequelae of cerebral infarction: Secondary | ICD-10-CM

## 2022-05-19 DIAGNOSIS — L859 Epidermal thickening, unspecified: Secondary | ICD-10-CM | POA: Diagnosis present

## 2022-05-19 DIAGNOSIS — I160 Hypertensive urgency: Secondary | ICD-10-CM | POA: Diagnosis present

## 2022-05-19 DIAGNOSIS — B961 Klebsiella pneumoniae [K. pneumoniae] as the cause of diseases classified elsewhere: Secondary | ICD-10-CM | POA: Diagnosis present

## 2022-05-19 DIAGNOSIS — E118 Type 2 diabetes mellitus with unspecified complications: Secondary | ICD-10-CM | POA: Diagnosis not present

## 2022-05-19 DIAGNOSIS — Z7401 Bed confinement status: Secondary | ICD-10-CM

## 2022-05-19 DIAGNOSIS — Z8249 Family history of ischemic heart disease and other diseases of the circulatory system: Secondary | ICD-10-CM

## 2022-05-19 DIAGNOSIS — Z993 Dependence on wheelchair: Secondary | ICD-10-CM | POA: Diagnosis not present

## 2022-05-19 DIAGNOSIS — L818 Other specified disorders of pigmentation: Secondary | ICD-10-CM | POA: Diagnosis present

## 2022-05-19 DIAGNOSIS — H543 Unqualified visual loss, both eyes: Secondary | ICD-10-CM | POA: Diagnosis present

## 2022-05-19 DIAGNOSIS — N186 End stage renal disease: Secondary | ICD-10-CM | POA: Diagnosis present

## 2022-05-19 DIAGNOSIS — I161 Hypertensive emergency: Secondary | ICD-10-CM | POA: Diagnosis present

## 2022-05-19 DIAGNOSIS — Z992 Dependence on renal dialysis: Secondary | ICD-10-CM

## 2022-05-19 DIAGNOSIS — E1122 Type 2 diabetes mellitus with diabetic chronic kidney disease: Secondary | ICD-10-CM | POA: Diagnosis present

## 2022-05-19 DIAGNOSIS — E1140 Type 2 diabetes mellitus with diabetic neuropathy, unspecified: Secondary | ICD-10-CM | POA: Diagnosis present

## 2022-05-19 DIAGNOSIS — E1152 Type 2 diabetes mellitus with diabetic peripheral angiopathy with gangrene: Secondary | ICD-10-CM | POA: Diagnosis present

## 2022-05-19 DIAGNOSIS — Z9049 Acquired absence of other specified parts of digestive tract: Secondary | ICD-10-CM

## 2022-05-19 DIAGNOSIS — T368X5A Adverse effect of other systemic antibiotics, initial encounter: Secondary | ICD-10-CM | POA: Diagnosis present

## 2022-05-19 DIAGNOSIS — L819 Disorder of pigmentation, unspecified: Secondary | ICD-10-CM | POA: Diagnosis not present

## 2022-05-19 DIAGNOSIS — N3080 Other cystitis without hematuria: Secondary | ICD-10-CM | POA: Diagnosis not present

## 2022-05-19 DIAGNOSIS — Z885 Allergy status to narcotic agent status: Secondary | ICD-10-CM

## 2022-05-19 DIAGNOSIS — M2041 Other hammer toe(s) (acquired), right foot: Secondary | ICD-10-CM

## 2022-05-19 DIAGNOSIS — Z888 Allergy status to other drugs, medicaments and biological substances status: Secondary | ICD-10-CM

## 2022-05-19 DIAGNOSIS — I340 Nonrheumatic mitral (valve) insufficiency: Secondary | ICD-10-CM | POA: Diagnosis present

## 2022-05-19 DIAGNOSIS — K219 Gastro-esophageal reflux disease without esophagitis: Secondary | ICD-10-CM | POA: Diagnosis present

## 2022-05-19 DIAGNOSIS — Z79899 Other long term (current) drug therapy: Secondary | ICD-10-CM | POA: Diagnosis not present

## 2022-05-19 DIAGNOSIS — M2042 Other hammer toe(s) (acquired), left foot: Secondary | ICD-10-CM | POA: Diagnosis present

## 2022-05-19 DIAGNOSIS — R42 Dizziness and giddiness: Secondary | ICD-10-CM | POA: Diagnosis not present

## 2022-05-19 DIAGNOSIS — Z91148 Patient's other noncompliance with medication regimen for other reason: Secondary | ICD-10-CM

## 2022-05-19 DIAGNOSIS — Y92239 Unspecified place in hospital as the place of occurrence of the external cause: Secondary | ICD-10-CM | POA: Diagnosis not present

## 2022-05-19 DIAGNOSIS — T465X6A Underdosing of other antihypertensive drugs, initial encounter: Secondary | ICD-10-CM | POA: Diagnosis present

## 2022-05-19 DIAGNOSIS — R112 Nausea with vomiting, unspecified: Secondary | ICD-10-CM | POA: Diagnosis present

## 2022-05-19 DIAGNOSIS — N319 Neuromuscular dysfunction of bladder, unspecified: Secondary | ICD-10-CM | POA: Diagnosis present

## 2022-05-19 DIAGNOSIS — Z881 Allergy status to other antibiotic agents status: Secondary | ICD-10-CM

## 2022-05-19 DIAGNOSIS — N390 Urinary tract infection, site not specified: Secondary | ICD-10-CM | POA: Diagnosis present

## 2022-05-19 DIAGNOSIS — I120 Hypertensive chronic kidney disease with stage 5 chronic kidney disease or end stage renal disease: Secondary | ICD-10-CM | POA: Diagnosis present

## 2022-05-19 DIAGNOSIS — E876 Hypokalemia: Secondary | ICD-10-CM | POA: Diagnosis present

## 2022-05-19 DIAGNOSIS — B964 Proteus (mirabilis) (morganii) as the cause of diseases classified elsewhere: Secondary | ICD-10-CM | POA: Diagnosis present

## 2022-05-19 DIAGNOSIS — L299 Pruritus, unspecified: Secondary | ICD-10-CM | POA: Diagnosis not present

## 2022-05-19 DIAGNOSIS — Z90710 Acquired absence of both cervix and uterus: Secondary | ICD-10-CM

## 2022-05-19 LAB — CBC WITH DIFFERENTIAL/PLATELET
Abs Immature Granulocytes: 0.03 10*3/uL (ref 0.00–0.07)
Basophils Absolute: 0.1 10*3/uL (ref 0.0–0.1)
Basophils Relative: 1 %
Eosinophils Absolute: 0.2 10*3/uL (ref 0.0–0.5)
Eosinophils Relative: 2 %
HCT: 31.7 % — ABNORMAL LOW (ref 36.0–46.0)
Hemoglobin: 9.7 g/dL — ABNORMAL LOW (ref 12.0–15.0)
Immature Granulocytes: 0 %
Lymphocytes Relative: 35 %
Lymphs Abs: 3 10*3/uL (ref 0.7–4.0)
MCH: 27.3 pg (ref 26.0–34.0)
MCHC: 30.6 g/dL (ref 30.0–36.0)
MCV: 89.3 fL (ref 80.0–100.0)
Monocytes Absolute: 1 10*3/uL (ref 0.1–1.0)
Monocytes Relative: 11 %
Neutro Abs: 4.4 10*3/uL (ref 1.7–7.7)
Neutrophils Relative %: 51 %
Platelets: 174 10*3/uL (ref 150–400)
RBC: 3.55 MIL/uL — ABNORMAL LOW (ref 3.87–5.11)
RDW: 15.3 % (ref 11.5–15.5)
WBC: 8.6 10*3/uL (ref 4.0–10.5)
nRBC: 0 % (ref 0.0–0.2)

## 2022-05-19 LAB — COMPREHENSIVE METABOLIC PANEL
ALT: 14 U/L (ref 0–44)
AST: 19 U/L (ref 15–41)
Albumin: 3.7 g/dL (ref 3.5–5.0)
Alkaline Phosphatase: 134 U/L — ABNORMAL HIGH (ref 38–126)
Anion gap: 10 (ref 5–15)
BUN: 30 mg/dL — ABNORMAL HIGH (ref 6–20)
CO2: 25 mmol/L (ref 22–32)
Calcium: 8.4 mg/dL — ABNORMAL LOW (ref 8.9–10.3)
Chloride: 102 mmol/L (ref 98–111)
Creatinine, Ser: 3.48 mg/dL — ABNORMAL HIGH (ref 0.44–1.00)
GFR, Estimated: 15 mL/min — ABNORMAL LOW (ref >60)
Glucose, Bld: 132 mg/dL — ABNORMAL HIGH (ref 70–99)
Potassium: 3.3 mmol/L — ABNORMAL LOW (ref 3.5–5.1)
Sodium: 137 mmol/L (ref 135–145)
Total Bilirubin: 0.8 mg/dL (ref 0.3–1.2)
Total Protein: 7.5 g/dL (ref 6.5–8.1)

## 2022-05-19 MED ORDER — LIDOCAINE HCL URETHRAL/MUCOSAL 2 % EX GEL
1.0000 | Freq: Once | CUTANEOUS | Status: AC
  Administered 2022-05-19: 1 via URETHRAL

## 2022-05-19 MED ORDER — GABAPENTIN 100 MG PO CAPS
100.0000 mg | ORAL_CAPSULE | Freq: Every day | ORAL | Status: DC | PRN
  Administered 2022-05-20: 100 mg via ORAL
  Filled 2022-05-19: qty 1

## 2022-05-19 MED ORDER — HYDRALAZINE HCL 20 MG/ML IJ SOLN
10.0000 mg | Freq: Once | INTRAMUSCULAR | Status: AC
  Administered 2022-05-19: 10 mg via INTRAVENOUS
  Filled 2022-05-19: qty 1

## 2022-05-19 MED ORDER — OXYCODONE HCL 5 MG PO TABS
5.0000 mg | ORAL_TABLET | Freq: Once | ORAL | Status: AC
  Administered 2022-05-19: 5 mg via ORAL
  Filled 2022-05-19: qty 1

## 2022-05-19 MED ORDER — NICARDIPINE HCL IN NACL 20-0.86 MG/200ML-% IV SOLN
3.0000 mg/h | INTRAVENOUS | Status: DC
  Administered 2022-05-19 – 2022-05-20 (×2): 5 mg/h via INTRAVENOUS
  Filled 2022-05-19 (×2): qty 200

## 2022-05-19 MED ORDER — HYDRALAZINE HCL 50 MG PO TABS
100.0000 mg | ORAL_TABLET | Freq: Three times a day (TID) | ORAL | Status: DC
  Administered 2022-05-20 – 2022-05-22 (×5): 100 mg via ORAL
  Filled 2022-05-19 (×7): qty 2

## 2022-05-19 MED ORDER — ONDANSETRON HCL 4 MG/2ML IJ SOLN
4.0000 mg | Freq: Once | INTRAMUSCULAR | Status: AC
  Administered 2022-05-19: 4 mg via INTRAVENOUS
  Filled 2022-05-19: qty 2

## 2022-05-19 MED ORDER — SULFAMETHOXAZOLE-TRIMETHOPRIM 800-160 MG PO TABS
1.0000 | ORAL_TABLET | Freq: Once | ORAL | Status: AC
  Administered 2022-05-19: 1 via ORAL
  Filled 2022-05-19: qty 1

## 2022-05-19 MED ORDER — AMOXICILLIN 500 MG PO CAPS
500.0000 mg | ORAL_CAPSULE | Freq: Once | ORAL | Status: AC
  Administered 2022-05-20: 500 mg via ORAL
  Filled 2022-05-19 (×2): qty 1

## 2022-05-19 NOTE — H&P (Signed)
History and Physical    Patient: [name] [name] OIN:[id number] DOB: [birth year] DOA: 05/19/2022 DOS: the patient was seen and examined on 05/19/2022 PCP: Myrtis Hopping., MD  Patient coming from: Home  Chief Complaint:  Chief Complaint  Patient presents with   Urinary Retention   HPI: [name] [name] is a 56 y.o. female with medical history significant of end-stage renal disease on hemodialysis Mondays Wednesday Fridays, diet-controlled diabetes, history of CVA with residual weakness, chronic indwelling catheter now removed with in and out, diabetic neuropathy, GERD, essential hypertension, visual disturbances who usually gets her care at [facility] and has a diagnosis of Pyoscystis that requires bladder irrigation 3 times a day.  Patient is literally unable to do this by herself.  She has some home health aide that occasionally comes in.  She had indwelling catheter that has now been removed recently was instruction for patient to self cath and irrigate 3 times a day.  Patient is unable to do this.  Came to the ER with suprapubic pain.  Generalized weakness.  Patient was noted to have markedly elevated blood pressure initially at 247/95.  Cardene drip was initiated.  Nephrology was consulted.  Patient's urologist at [facility] were also consulted.  Recommendation is to reinsert Foley and leave in place with irrigation 3 times a day like they instructed.  Patient was dialyzed today and will likely need dialysis again on Friday.  Nephrology aware.  No evidence of sepsis or ongoing infection in the morning.  She is being admitted for treatment of malignant hypertension.  Review of Systems: As mentioned in the history of present illness. All other systems reviewed and are negative. Past Medical History:  Diagnosis Date   Colitis    Diabetes mellitus    ESRD (end stage renal disease) ([place])    on HD (M,W,F)   Family history of adverse reaction to anesthesia    " my uncle's heart  stoped "   Fracture of femoral neck, right ([place]) 10/02/2020   GERD (gastroesophageal reflux disease)    Heart murmur    Hypertension    Impaired vision in both eyes 11/19/2015   Mitral regurgitation    Neuropathy in diabetes [facility])    Syncope and collapse 07/28/2015   Past Surgical History:  Procedure Laterality Date   ABDOMINAL HYSTERECTOMY  1999   for fibroids. she thinks one ovary was left   CATARACT EXTRACTION     CESAREAN SECTION     CHOLECYSTECTOMY     EYE SURGERY Left retinal   GANGLION CYST EXCISION     HIP PINNING,CANNULATED Right 10/02/2020   Procedure: CANNULATED HIP PINNING;  Surgeon: Altamese Wasta, MD;  Location: [place];  Service: Orthopedics;  Laterality: Right;   INCISION AND DRAINAGE ABSCESS N/A 04/10/2021   Procedure: INCISION AND DRAINAGE ABSCESS LABIA;  Surgeon: Jovita Kussmaul, MD;  Location: [place];  Service: General;  Laterality: N/A;   IR FLUORO GUIDE CV LINE LEFT  10/13/2020   IR PTA VENOUS EXCEPT DIALYSIS CIRCUIT  10/13/2020   ORIF TIBIA PLATEAU Left 10/09/2020   Procedure: OPEN REDUCTION INTERNAL FIXATION (ORIF) TIBIAL PLATEAU;  Surgeon: Altamese Dodson, MD;  Location: [place];  Service: Orthopedics;  Laterality: Left;   TONSILLECTOMY     TUBAL LIGATION     Social History:  reports that she has never smoked. She has never used smokeless tobacco. She reports that she does not drink alcohol and does not use drugs.  Allergies  Allergen Reactions  Amitriptyline Swelling    Other reaction(s): Confusion (intolerance)   Ciprofloxacin Diarrhea and Nausea And Vomiting    Severe vomiting requiring ED visit, IV reglan and IV fluids     Semaglutide Nausea Only   Tramadol Itching   Tylenol [Acetaminophen] Itching    Family History  Problem Relation Age of Onset   Diabetes Father    Heart attack Father    Aneurysm Mother     Prior to Admission medications   Medication Sig Start Date End Date Taking? Authorizing Provider  BISACODYL PO Take 5 mg by mouth  daily as needed (constipation).   Yes [provider]  Calcium Carbonate Antacid (TUMS PO) Take 1 Dose by mouth 3 (three) times daily with meals.   Yes [provider]  hydrALAZINE (APRESOLINE) 100 MG tablet Take 100 mg by mouth 3 (three) times daily.   Yes [provider]  lidocaine (LIDODERM) 5 % Place 1 patch onto the skin daily. Remove & Discard patch within 12 hours or as directed by MD 10/16/20  Yes Alexandria Lodge, MD  amoxicillin-clavulanate (AUGMENTIN) 500-125 MG tablet Take 1 tablet (500 mg total) by mouth 2 (two) times daily. Patient not taking: Reported on 05/19/2022 04/14/21   Elgergawy, Silver Huguenin, MD  senna-docusate (SENOKOT-S) 8.6-50 MG tablet Take 2 tablets by mouth 2 (two) times daily. Patient not taking: Reported on 04/08/2021 10/16/20   Alexandria Lodge, MD  SUMAtriptan (IMITREX) 100 MG tablet TAKE 1 TABLET (100 MG TOTAL) BY MOUTH ONCE AS NEEDED FOR MIGRAINE. MAY REPEAT IN 2 HOURS IF HEADACHE PERSISTS OR RECURS. Patient not taking: No sig reported 06/03/20 06/03/21  [place] J, [place]    Physical Exam: Vitals:   05/19/22 2115 05/19/22 2215 05/19/22 2245 05/19/22 2310  BP: (!) 145/69 (!) 247/95 (!) 182/74 (!) 169/72  Pulse: 62 78 73 72  Resp: 20 18 17    Temp:      TempSrc:      SpO2: 97% 100% 99% 98%  Height:       Constitutional: Chronically ill looking, weak, laying in bed NAD, calm, comfortable Eyes: PERRL, lids and conjunctivae normal ENMT: Mucous membranes are moist. Posterior pharynx clear of any exudate or lesions.Normal dentition.  Neck: normal, supple, no masses, no thyromegaly Respiratory: clear to auscultation bilaterally, no wheezing, no crackles. Normal respiratory effort. No accessory muscle use.  Cardiovascular: Regular rate and rhythm, no murmurs / rubs / gallops. No extremity edema. 2+ pedal pulses. No carotid bruits.  Abdomen: no tenderness, no masses palpated. No hepatosplenomegaly. Bowel sounds positive.  Musculoskeletal: Good  range of motion, no joint swelling or tenderness, Skin: no rashes, lesions, ulcers. No induration Neurologic: CN 2-12 grossly intact.  Decreased strength and sensation bilateral lower extremity with flaccidity psychiatric: Normal judgment and insight. Alert and oriented x 3. Normal mood  Data Reviewed:  Blood pressure 240/95, hemoglobin 9.7, potassium 3.3, creatinine 3.48, BUN 30, calcium 8.4, glucose 132  Assessment and Plan:  #1 malignant hypertension: Most likely secondary to patient's inability to take her home regimen adequately.  She does have known hypertension with end-stage renal disease.  Patient will continue with Cardene drip until titrated.  Hydralazine orally ordered.  Titrate the drip off.  Nephrology consulted.  #2 end-stage renal disease: On hemodialysis Mondays Wednesdays and Fridays.  Patient was dialyzed today.  Next dialysis will be on Friday if still in the hospital.  #3 Pyocystis: Patient has Foley catheter to have Foley reinserted now.  She will need irrigation 3 times a  day.  Follow her urologist recommendations.  #4 diabetes: Currently controlled.  Not on any therapy.  Probably secondary to end-stage renal disease.  We will monitor  #5 hypokalemia: Defer to nephrology.  #6 urinary retention: As per above.  Continue with the Foley catheter.     Advance Care Planning:   Code Status: Prior full code  Consults: Dr. Posey Pronto, nephrology  Family Communication: No family at bedside  Severity of Illness: The appropriate patient status for this patient is INPATIENT. Inpatient status is judged to be reasonable and necessary in order to provide the required intensity of service to ensure the patient's safety. The patient's presenting symptoms, physical exam findings, and initial radiographic and laboratory data in the context of their chronic comorbidities is felt to place them at high risk for further clinical deterioration. Furthermore, it is not anticipated that the  patient will be medically stable for discharge from the hospital within 2 midnights of admission.   * I certify that at the point of admission it is my clinical judgment that the patient will require inpatient hospital care spanning beyond 2 midnights from the point of admission due to high intensity of service, high risk for further deterioration and high frequency of surveillance required.*  AuthorBarbette Merino, MD 05/19/2022 11:22 PM  For on call review [email or web address].

## 2022-05-19 NOTE — Progress Notes (Signed)
CSW reached out to [place], [place], [place], [place] [facility], [facility] and [facility]. [facility] said to follow up tomorrow. However all other HH has refused patient.

## 2022-05-19 NOTE — Progress Notes (Signed)
Transition of Care [facility]) - Emergency Department Mini Assessment   Patient Details  Name: [name] [name] MRN: [id number] Date of Birth: 14-Feb-1966  Transition of Care [facility]) CM/SW Contact:    Rodney Booze, LCSW Phone Number: 05/19/2022, 5:45 PM   Clinical Narrative: CSW spoke to the patient the patient would like home health CSW has reached out to many places such as [facility] as they stated they would let us know tomorrow [facility] has rejected [facility] has rejected as well both [facility] due to INS. CSW will continue to reach out TOC will have to follow up in the AM if we do not get any answer's tonight.    ED Mini Assessment: What brought you to the Emergency Department? : (P) Patient was sent here for urinery retention. [facility] will not accept based on INS  Barriers to Discharge: (P) No [facility] will accept this patient  Barrier interventions: (P) Home health will not accept due to INS  Means of departure: (P) Ambulance  Interventions which prevented an admission or readmission: Home Health Consult or Services    Patient Contact and Communications     Spoke with: (P) Patient Contact Date: (P) 05/19/22,                 Admission diagnosis:  urinary retention Patient Active Problem List   Diagnosis Date Noted   Cellulitis 04/09/2021   Cellulitis of suprapubic region 04/09/2021   Leukocytosis 04/09/2021   DM2 (diabetes mellitus, type 2) ([place]) 04/09/2021   Osteoporosis 10/13/2020   Fever    Closed fracture of left proximal tibia 10/10/2020   Fracture of femoral neck, right ([place]) 10/02/2020   Pressure injury of skin 03/12/2020   Sepsis ([place]) 03/07/2020   ESRD (end stage renal disease) ([place]) 03/07/2020   Hyperkalemia 03/07/2020   Acute cystitis 27/78/2423   Acute metabolic encephalopathy 53/61/4431   Impaired vision in both eyes 11/19/2015   Diabetes mellitus with complication (HCC)    Intractable headache    Syncope 07/28/2015    Chronic headaches 07/28/2015   Chest pain 05/22/2015   Urinary frequency 03/13/2015   Easy bruising 10/09/2014   Encounter for preventative adult health care exam with abnormal findings 01/11/2014   Chronic constipation 01/11/2014   GERD (gastroesophageal reflux disease) 54/00/8676   Helicobacter positive gastritis 12/06/2013   Dizziness and giddiness 10/12/2013   Essential hypertension 09/19/2013   Back pain 06/11/2013   Lumbosacral spondylosis without myelopathy 02/20/2013   Diabetic neuropathy ([place]) 01/09/2013   DM (diabetes mellitus), type 2, uncontrolled 10/02/2012   MITRAL REGURGITATION, mild 10/31/2009   PLEURAL EFFUSION, RIGHT after pyelonephritis in April 2011 (no intervention) 10/20/2009   PCP:  Myrtis Hopping., MD Pharmacy:   [facility] [phone] - [place] - [address] AT [facility] [address] [place] [address] Phone: [phone] Fax: [phone]  [facility] [address] [place] [address] Phone: [phone] Fax: [phone]

## 2022-05-19 NOTE — Discharge Instructions (Addendum)
  Please call for an appointment for a diabetic foot exam and care at [place] and [place]. [place] for [facility] 4.5 24 Google reviews  Home health care service in [place], [place] online care: [email or web address] Located in: [employer] Address: [address], [place], [place] [address] Hours:  Closed ? Opens 8?AM Thu Phone: [phone] Appointments: [email or web address]   Providers    TOC will follow up with the patient about [facility] agencies.

## 2022-05-19 NOTE — ED Provider Notes (Signed)
[facility] DEPT Provider Note  CSN: [id number] Arrival date & time: 05/19/22 1534  Chief Complaint(s) Urinary Retention  HPI [name] [name] is a 56 y.o. female with PMH ESRD on hemodialysis Monday Wednesday Friday, GERD, pyocystis who presents emergency department for evaluation of suprapubic abdominal pain.  Patient states that she recently had her Foley catheter removed on 05/07/2022 and was discharged home with instructions to irrigate her own bladder.  She states that she is supposed to have home health but nobody has ever come to help her at home.  She states that she completed dialysis today and with her suprapubic abdominal pain they called an ambulance due to concern for patient's "urinary retention".  By review of urology notes, the patient does not make any urine and should be flushing the bladder 3 times daily to manage the pyocystis.  Patient denies chest pain, shortness of breath, fever, nausea, vomiting or other systemic symptoms.   Past Medical History Past Medical History:  Diagnosis Date   Colitis    Diabetes mellitus    ESRD (end stage renal disease) ([place])    on HD (M,W,F)   Family history of adverse reaction to anesthesia    " my uncle's heart stoped "   Fracture of femoral neck, right ([place]) 10/02/2020   GERD (gastroesophageal reflux disease)    Heart murmur    Hypertension    Impaired vision in both eyes 11/19/2015   Mitral regurgitation    Neuropathy in diabetes ([place])    Syncope and collapse 07/28/2015   Patient Active Problem List   Diagnosis Date Noted   Cellulitis 04/09/2021   Cellulitis of suprapubic region 04/09/2021   Leukocytosis 04/09/2021   DM2 (diabetes mellitus, type 2) ([place]) 04/09/2021   Osteoporosis 10/13/2020   Fever    Closed fracture of left proximal tibia 10/10/2020   Fracture of femoral neck, right ([place]) 10/02/2020   Pressure injury of skin 03/12/2020   Sepsis ([place]) 03/07/2020   ESRD (end stage renal  disease) ([place]) 03/07/2020   Hyperkalemia 03/07/2020   Acute cystitis 40/98/1191   Acute metabolic encephalopathy 47/82/9562   Impaired vision in both eyes 11/19/2015   Diabetes mellitus with complication (HCC)    Intractable headache    Syncope 07/28/2015   Chronic headaches 07/28/2015   Chest pain 05/22/2015   Urinary frequency 03/13/2015   Easy bruising 10/09/2014   Encounter for preventative adult health care exam with abnormal findings 01/11/2014   Chronic constipation 01/11/2014   GERD (gastroesophageal reflux disease) 13/02/6577   Helicobacter positive gastritis 12/06/2013   Dizziness and giddiness 10/12/2013   Essential hypertension 09/19/2013   Back pain 06/11/2013   Lumbosacral spondylosis without myelopathy 02/20/2013   Diabetic neuropathy ([place]) 01/09/2013   DM (diabetes mellitus), type 2, uncontrolled 10/02/2012   MITRAL REGURGITATION, mild 10/31/2009   PLEURAL EFFUSION, RIGHT after pyelonephritis in April 2011 (no intervention) 10/20/2009   Home Medication(s) Prior to Admission medications   Medication Sig Start Date End Date Taking? Authorizing Provider  amoxicillin-clavulanate (AUGMENTIN) 500-125 MG tablet Take 1 tablet (500 mg total) by mouth 2 (two) times daily. 04/14/21   Elgergawy, Silver Huguenin, MD  BISACODYL PO Take 5 mg by mouth daily as needed (constipation).    [provider]  Calcium Carbonate Antacid (TUMS PO) Take 1 Dose by mouth 3 (three) times daily with meals.    [provider]  fluconazole (DIFLUCAN) 100 MG tablet Take 100 mg by mouth once a week.    [provider]  gabapentin (NEURONTIN) 100 MG capsule Take 100 mg by mouth daily as needed (pain).    [provider]  hydrALAZINE (APRESOLINE) 100 MG tablet Take 100 mg by mouth 3 (three) times daily.    [provider]  lidocaine (LIDODERM) 5 % Place 1 patch onto the skin daily. Remove & Discard patch within 12 hours or as directed by MD 10/16/20   Alexandria Lodge,  MD  senna-docusate (SENOKOT-S) 8.6-50 MG tablet Take 2 tablets by mouth 2 (two) times daily. Patient not taking: No sig reported 10/16/20   Alexandria Lodge, MD  SUMAtriptan (IMITREX) 100 MG tablet TAKE 1 TABLET (100 MG TOTAL) BY MOUTH ONCE AS NEEDED FOR MIGRAINE. MAY REPEAT IN 2 HOURS IF HEADACHE PERSISTS OR RECURS. Patient not taking: No sig reported 06/03/20 06/03/21  [place] Eric J, DO                                                                                                                                    Past Surgical History Past Surgical History:  Procedure Laterality Date   ABDOMINAL HYSTERECTOMY  1999   for fibroids. she thinks one ovary was left   CATARACT EXTRACTION     CESAREAN SECTION     CHOLECYSTECTOMY     EYE SURGERY Left retinal   GANGLION CYST EXCISION     HIP PINNING,CANNULATED Right 10/02/2020   Procedure: CANNULATED HIP PINNING;  Surgeon: Altamese Talkeetna, MD;  Location: [facility];  Service: Orthopedics;  Laterality: Right;   INCISION AND DRAINAGE ABSCESS N/A 04/10/2021   Procedure: INCISION AND DRAINAGE ABSCESS LABIA;  Surgeon: Jovita Kussmaul, MD;  Location: [facility];  Service: General;  Laterality: N/A;   IR FLUORO GUIDE CV LINE LEFT  10/13/2020   IR PTA VENOUS EXCEPT DIALYSIS CIRCUIT  10/13/2020   ORIF TIBIA PLATEAU Left 10/09/2020   Procedure: OPEN REDUCTION INTERNAL FIXATION (ORIF) TIBIAL PLATEAU;  Surgeon: Altamese Woodway, MD;  Location: [facility];  Service: Orthopedics;  Laterality: Left;   TONSILLECTOMY     TUBAL LIGATION     Family History Family History  Problem Relation Age of Onset   Diabetes Father    Heart attack Father    Aneurysm Mother     Social History Social History   Tobacco Use   Smoking status: Never   Smokeless tobacco: Never  Substance Use Topics   Alcohol use: No    Alcohol/week: 0.0 standard drinks of alcohol   Drug use: No   Allergies Amitriptyline, Ciprofloxacin, Semaglutide, Tramadol, and Tylenol [acetaminophen]  Review of  Systems Review of Systems  Gastrointestinal:  Positive for abdominal pain.  Genitourinary:  Positive for dysuria.    Physical Exam Vital Signs  I have reviewed the triage vital signs Ht 5' (1.524 m)   BMI 29.11 kg/m   Physical Exam Vitals and nursing note reviewed.  Constitutional:      General: She is not in  acute distress.    Appearance: She is well-developed. She is ill-appearing.  HENT:     Head: Normocephalic and atraumatic.  Eyes:     Conjunctiva/sclera: Conjunctivae normal.  Cardiovascular:     Rate and Rhythm: Normal rate and regular rhythm.     Heart sounds: No murmur heard. Pulmonary:     Effort: Pulmonary effort is normal. No respiratory distress.     Breath sounds: Normal breath sounds.  Abdominal:     Palpations: Abdomen is soft.     Tenderness: There is no abdominal tenderness.  Musculoskeletal:        General: Tenderness present. No swelling.     Cervical back: Neck supple.  Skin:    General: Skin is warm and dry.     Capillary Refill: Capillary refill takes less than 2 seconds.  Neurological:     Mental Status: She is alert.  Psychiatric:        Mood and Affect: Mood normal.     ED Results and Treatments Labs (all labs ordered are listed, but only abnormal results are displayed) Labs Reviewed - No data to display                                                                                                                        Radiology No results found.  Pertinent labs & imaging results that were available during my care of the patient were reviewed by me and considered in my medical decision making (see MDM for details).  Medications Ordered in ED Medications  oxyCODONE (Oxy IR/ROXICODONE) immediate release tablet 5 mg (has no administration in time range)  lidocaine (XYLOCAINE) 2 % jelly 1 Application (has no administration in time range)                                                                                                                                      Procedures Ultrasound ED Abd  Date/Time: 05/19/2022 10:38 PM  Performed by: Teressa Lower, MD Authorized by: Teressa Lower, MD   Procedure details:    Indications: decreased urinary output     Assessment for:  Mass (urinary retention)   Bladder:  Visualized    Images: not archived    Bladder findings:    Volume:  200 cc Comments:     Bladder with significant amount of retained debris, minimal urine .Critical Care  Performed by: Teressa Lower,  MD Authorized by: Teressa Lower, MD   Critical care provider statement:    Critical care time (minutes):  30   Critical care was necessary to treat or prevent imminent or life-threatening deterioration of the following conditions: HTN emergency requiring cardene gtt.   Critical care was time spent personally by me on the following activities:  Development of treatment plan with patient or surrogate, discussions with consultants, evaluation of patient's response to treatment, examination of patient, ordering and review of laboratory studies, ordering and review of radiographic studies, ordering and performing treatments and interventions, pulse oximetry, re-evaluation of patient's condition and review of old charts   (including critical care time)  Medical Decision Making / ED Course   This patient presents to the ED for concern of dysuria, nausea, vomiting this involves an extensive number of treatment options, and is a complaint that carries with it a high risk of complications and morbidity.  The differential diagnosis includes urinary obstruction, progression of known pyocystis, UTI, hypertensive urgency, hypertensive emergency, intra-abdominal infection  MDM: Patient seen emergency room for evaluation of multiple complaints described above.  Physical exam with tenderness in the suprapubic region but is otherwise unremarkable.  Laboratory evaluation with a hemoglobin of 9.7 which is baseline,  potassium 3.3, BUN 30, creatinine 3.48 consistent with her ESRD.  Bedside ultrasound showing significant mount of debris in the bladder and a three-way 24 [place] Foley was placed and 3 L of saline was irrigated leading to removal of clots and purulent material.  This is consistent with her history of a pyocystis.  I spoke with the urology team at [facility] Dr. Shari Prows who is recommending closer outpatient follow-up and keeping the Foley in place with 3 times daily bladder irrigations.  Also recommending empirically starting Bactrim based on previous culture data.  I consulted the [facility] team as the patient will need home health care and it appears that this is a significant problem for the patient as she has been already rejected from 6 different sites and the [facility] team will continue to work with the patient.  On my reevaluation, patient is endorsing generalized malaise and fatigue and her blood pressures are rising.  I attempted to give the patient her home blood pressure medications and she vomited them up.  I then gave her IV hydralazine with no improvement of her blood pressures and as her blood pressures reached 247/95 a Cardene drip was initiated.  Patient require hospital admission for hypertensive emergency requiring Cardene drip and once blood pressure is under control, the patient does have home health available, she would be safe for discharge back home with home health.  I did speak with Dr. Posey Pronto of nephrology who will run dialysis if the patient is still in the hospital by Friday.   Additional history obtained:  -External records from outside source obtained and reviewed including: Chart review including previous notes, labs, imaging, consultation notes   Lab Tests: -I ordered, reviewed, and interpreted labs.   The pertinent results include:   Labs Reviewed - No data to display    Medicines ordered and prescription drug management: Meds ordered this encounter  Medications    oxyCODONE (Oxy IR/ROXICODONE) immediate release tablet 5 mg   lidocaine (XYLOCAINE) 2 % jelly 1 Application    -I have reviewed the patients home medicines and have made adjustments as needed  Critical interventions Cardene drip for hypertensive emergency  Consultations Obtained: I requested consultation with the urologist at [facility] Dr. Shari Prows, nephrologist  Dr. Posey Pronto,  and discussed lab and imaging findings as well as pertinent plan - they recommend: 3 times daily flushes, Bactrim empirically, close outpatient urologic follow-up   Cardiac Monitoring: The patient was maintained on a cardiac monitor.  I personally viewed and interpreted the cardiac monitored which showed an underlying rhythm of: NSR  Social Determinants of Health:  Factors impacting patients care include: Has a home health aide that comes daily but no home health nursing   Reevaluation: After the interventions noted above, I reevaluated the patient and found that they have :stayed the same  Co morbidities that complicate the patient evaluation  Past Medical History:  Diagnosis Date   Colitis    Diabetes mellitus    ESRD (end stage renal disease) ([place])    on HD (M,W,F)   Family history of adverse reaction to anesthesia    " my uncle's heart stoped "   Fracture of femoral neck, right ([place]) 10/02/2020   GERD (gastroesophageal reflux disease)    Heart murmur    Hypertension    Impaired vision in both eyes 11/19/2015   Mitral regurgitation    Neuropathy in diabetes ([place])    Syncope and collapse 07/28/2015      Dispostion: I considered admission for this patient, and due to hypertensive emergency requiring Cardene drip, patient require hospital admission     Final Clinical Impression(s) / ED Diagnoses Final diagnoses:  None     @PCDICTATION @    Teressa Lower, MD 05/19/22 2248

## 2022-05-19 NOTE — ED Notes (Addendum)
Here for urinary retention. Pt has been sent home with straight caths in the past, but she is not able to do this w/o help. She has a [place] caregiver who comes by daily but is unable to do this. C/o bladder pain, and nausea. Denies sob. Does not walk. Has felt bad all day before and after HD. Last void or urine output was two weeks ago.

## 2022-05-19 NOTE — ED Triage Notes (Addendum)
BIB EMS due to urinary retention, pt is on dialysis and completed treatment today at [facility], Pts PCP and facility staff sent pt here due to pt having "full bladder" pt has had a cathter before for same, pt states she has not been able to void for 2 weeks. 210/100-

## 2022-05-20 DIAGNOSIS — I161 Hypertensive emergency: Principal | ICD-10-CM | POA: Diagnosis present

## 2022-05-20 LAB — CBC
HCT: 28.7 % — ABNORMAL LOW (ref 36.0–46.0)
Hemoglobin: 8.8 g/dL — ABNORMAL LOW (ref 12.0–15.0)
MCH: 27.6 pg (ref 26.0–34.0)
MCHC: 30.7 g/dL (ref 30.0–36.0)
MCV: 90 fL (ref 80.0–100.0)
Platelets: 173 K/uL (ref 150–400)
RBC: 3.19 MIL/uL — ABNORMAL LOW (ref 3.87–5.11)
RDW: 15.5 % (ref 11.5–15.5)
WBC: 8.2 K/uL (ref 4.0–10.5)
nRBC: 0 % (ref 0.0–0.2)

## 2022-05-20 LAB — COMPREHENSIVE METABOLIC PANEL WITH GFR
ALT: 12 U/L (ref 0–44)
AST: 15 U/L (ref 15–41)
Albumin: 2.9 g/dL — ABNORMAL LOW (ref 3.5–5.0)
Alkaline Phosphatase: 114 U/L (ref 38–126)
Anion gap: 9 (ref 5–15)
BUN: 33 mg/dL — ABNORMAL HIGH (ref 6–20)
CO2: 23 mmol/L (ref 22–32)
Calcium: 8.4 mg/dL — ABNORMAL LOW (ref 8.9–10.3)
Chloride: 105 mmol/L (ref 98–111)
Creatinine, Ser: 4.14 mg/dL — ABNORMAL HIGH (ref 0.44–1.00)
GFR, Estimated: 12 mL/min — ABNORMAL LOW (ref >60)
Glucose, Bld: 138 mg/dL — ABNORMAL HIGH (ref 70–99)
Potassium: 4.1 mmol/L (ref 3.5–5.1)
Sodium: 137 mmol/L (ref 135–145)
Total Bilirubin: 0.6 mg/dL (ref 0.3–1.2)
Total Protein: 6.4 g/dL — ABNORMAL LOW (ref 6.5–8.1)

## 2022-05-20 LAB — HIV ANTIBODY (ROUTINE TESTING W REFLEX): HIV Screen 4th Generation wRfx: NONREACTIVE

## 2022-05-20 MED ORDER — HEPARIN SODIUM (PORCINE) 5000 UNIT/ML IJ SOLN
5000.0000 [IU] | Freq: Three times a day (TID) | INTRAMUSCULAR | Status: DC
  Administered 2022-05-20 – 2022-05-22 (×6): 5000 [IU] via SUBCUTANEOUS
  Filled 2022-05-20 (×6): qty 1

## 2022-05-20 MED ORDER — ONDANSETRON HCL 4 MG PO TABS: 4.0000 mg | ORAL_TABLET | Freq: Four times a day (QID) | ORAL | Status: DC | PRN

## 2022-05-20 MED ORDER — HYDROXYZINE HCL 25 MG PO TABS
25.0000 mg | ORAL_TABLET | Freq: Three times a day (TID) | ORAL | Status: DC | PRN
  Administered 2022-05-22: 25 mg via ORAL
  Filled 2022-05-20: qty 1

## 2022-05-20 MED ORDER — SODIUM CHLORIDE 0.9 % IV SOLN
12.5000 mg | Freq: Once | INTRAVENOUS | Status: AC
  Administered 2022-05-20: 12.5 mg via INTRAVENOUS
  Filled 2022-05-20: qty 12.5

## 2022-05-20 MED ORDER — ONDANSETRON HCL 4 MG/2ML IJ SOLN
4.0000 mg | Freq: Four times a day (QID) | INTRAMUSCULAR | Status: DC | PRN
  Filled 2022-05-20: qty 2

## 2022-05-20 MED ORDER — ACETAMINOPHEN 325 MG PO TABS: 650.0000 mg | ORAL_TABLET | Freq: Four times a day (QID) | ORAL | Status: DC | PRN

## 2022-05-20 MED ORDER — SORBITOL 70 % SOLN
30.0000 mL | Status: DC | PRN
  Filled 2022-05-20: qty 30

## 2022-05-20 MED ORDER — ACETAMINOPHEN 650 MG RE SUPP: 650.0000 mg | Freq: Four times a day (QID) | RECTAL | Status: DC | PRN

## 2022-05-20 MED ORDER — NEPRO/CARBSTEADY PO LIQD
237.0000 mL | Freq: Three times a day (TID) | ORAL | Status: DC | PRN
  Filled 2022-05-20: qty 237

## 2022-05-20 MED ORDER — HYDRALAZINE HCL 20 MG/ML IJ SOLN
10.0000 mg | INTRAMUSCULAR | Status: DC | PRN
  Administered 2022-05-21 – 2022-05-22 (×3): 10 mg via INTRAVENOUS
  Filled 2022-05-20 (×2): qty 1

## 2022-05-20 MED ORDER — CALCIUM CARBONATE ANTACID 1250 MG/5ML PO SUSP
500.0000 mg | Freq: Four times a day (QID) | ORAL | Status: DC | PRN
  Filled 2022-05-20: qty 5

## 2022-05-20 MED ORDER — SULFAMETHOXAZOLE-TRIMETHOPRIM 400-80 MG PO TABS
1.0000 | ORAL_TABLET | Freq: Every day | ORAL | Status: DC
  Administered 2022-05-20 – 2022-05-21 (×2): 1 via ORAL
  Filled 2022-05-20 (×3): qty 1

## 2022-05-20 MED ORDER — DOCUSATE SODIUM 283 MG RE ENEM
1.0000 | ENEMA | RECTAL | Status: DC | PRN
  Filled 2022-05-20: qty 1

## 2022-05-20 MED ORDER — CHLORHEXIDINE GLUCONATE CLOTH 2 % EX PADS
6.0000 | MEDICATED_PAD | Freq: Every day | CUTANEOUS | Status: DC
  Administered 2022-05-21: 6 via TOPICAL

## 2022-05-20 MED ORDER — CAMPHOR-MENTHOL 0.5-0.5 % EX LOTN
1.0000 | TOPICAL_LOTION | Freq: Three times a day (TID) | CUTANEOUS | Status: DC | PRN
  Administered 2022-05-22: 1 via TOPICAL
  Filled 2022-05-20 (×2): qty 222

## 2022-05-20 MED ORDER — ZOLPIDEM TARTRATE 5 MG PO TABS: 5.0000 mg | ORAL_TABLET | Freq: Every evening | ORAL | Status: DC | PRN

## 2022-05-20 MED ORDER — ONDANSETRON HCL 4 MG/2ML IJ SOLN
4.0000 mg | Freq: Four times a day (QID) | INTRAMUSCULAR | Status: DC | PRN
  Administered 2022-05-20 (×2): 4 mg via INTRAVENOUS
  Filled 2022-05-20: qty 2

## 2022-05-20 NOTE — Progress Notes (Signed)
[name] [name] with [facility] called and this CSW informed that the pt is being admitted. [name] [name] states she will follow the pt. This CSW informed [name] [name] that I will provide any updates if for some reason the pt is not admitted and discharged home.

## 2022-05-20 NOTE — ED Notes (Signed)
Meds given with ginger ale. Resting sleeping.

## 2022-05-20 NOTE — ED Notes (Signed)
[facility] called. 

## 2022-05-20 NOTE — ED Notes (Signed)
Admitting at BS

## 2022-05-20 NOTE — ED Notes (Signed)
Reluctant to try drink/ ginger ale...given ice chips per request (PO challenge)

## 2022-05-20 NOTE — Progress Notes (Signed)
PROGRESS NOTE    [name] [name]  SPQ:[id number] DOB: [birth year] DOA: 05/19/2022 PCP: Myrtis Hopping., MD    Brief Narrative:  56 year old female with history of ESRD on hemodialysis Monday Wednesday Friday through left chest wall permacath, type II controlled diabetes on diet control, history of stroke with residual weakness, currently wheelchair-bound, GERD, hypertension who does have significant history of Pyoscystitis that requires bladder irrigation at least 3 times a day.  She has been in and out of the hospital due to this.  She develops a lot of blood clot and mucus debris if not irrigated.  She usually have Foley catheter, seen by urology in follow-up, patient requested to remove Foley catheter so she was sent home to do a straight cath 3 times a day and flush her bladder that she cannot do it and her husband is also unable to do it.  Patient started developing suprapubic discomfort so came to the emergency room. In the emergency room she was noted to have blood pressure of 247/95, Cardene drip was initiated. Foley catheter was placed and CBI was started, case was discussed with [facility] urology as noted in the ER doctor's note.   Assessment & Plan:   Hypertensive emergency: Patient was unable to take by mouth due to abdominal pain and nausea.  Improving. On Cardene drip, well controlled.  Is started home dose of hydralazine 100 mg 3 times a day and wean off Cardene.  ESRD on hemodialysis: Monday Wednesday Friday.  She had complete dialysis Wednesday.  Next dialysis tomorrow and likely will need to stay tomorrow.  Dr. Posey Pronto was notified.  Pyocystic: Significant issue.  Followed by urology at [facility]. Latest recommendation is Foley catheter and irrigate 2-3 times with 300 mL normal saline to keep debris and blood clot out of the bladder.  This is what they have recommended until outpatient follow-up with them. Patient has recurrent hospitalizations as she is herself unable to do  it and she is not able to find any home health nurse to do it. Patient tells me that her husband do not want to do it or cannot do it. Not sure home health nurse will come to her home 2-3 times a day to flush her catheter, this will be a large logistic challenge.  Social worker trying to coordinate. As recommended, will continue Bactrim, renally dosed until outpatient follow-up.  Patient does not have adequate support system at home.   DVT prophylaxis: heparin injection 5,000 Units Start: 05/20/22 0930   Code Status: Full code Family Communication: None at the bedside Disposition Plan: Status is: Inpatient Remains inpatient appropriate because: On Cardene drip.  Cannot flush her bladder at home.     Consultants:  Nephrology, notified [facility] urology, recommendations on the ER doctor's note  Procedures:  Foley catheter and irrigation  Antimicrobials:  Bactrim   Subjective: Patient seen and examined.  Still in the emergency room.  On my morning exam she complained of nausea, was given a dose of Phenergan and she was able to take oral medications including hydralazine.  Blood pressure stable.  Cardene off. Patient is getting bladder irrigation and currently on CBI. Recommendations are to irrigate her bladder with Foley catheter in place 2-3 times a day and patient tells me that she cannot wait so patient needs to be admitted until we figure out how to do this or she may have to go to nursing home that patient declines. Social worker is diligently working on finding some way  out.  Objective: Vitals:   05/20/22 1200 05/20/22 1230 05/20/22 1300 05/20/22 1330  BP: (!) 147/74 (!) 151/80 137/65 128/79  Pulse: 72 76 71 74  Resp: 16 19 15 18   Temp:      TempSrc:      SpO2: 99% 98% 99% 100%  Height:        Intake/Output Summary (Last 24 hours) at 05/20/2022 1340 Last data filed at 05/20/2022 1227 Gross per 24 hour  Intake 250 ml  Output 500 ml  Net -250 ml   There were  no vitals filed for this visit.  Examination:  General exam: Appears calm.  Looks comfortable.  Appropriately anxious. Profound weakness. Respiratory system: Clear to auscultation. Respiratory effort normal.  Patient has a permacath on the left chest wall. Cardiovascular system: S1 & S2 heard, RRR.  No pedal edema. Gastrointestinal system: Soft.  Mild tenderness in the suprapubic area.  No rigidity or guarding.   Foley catheter with thin pink-tinged blood.  CBI running.   Psychiatry: Judgement and insight appear normal. Mood & affect appropriate.     Data Reviewed: I have personally reviewed following labs and imaging studies  CBC: Recent Labs  Lab 05/19/22 1729 05/20/22 1030  WBC 8.6 8.2  NEUTROABS 4.4  --   HGB 9.7* 8.8*  HCT 31.7* 28.7*  MCV 89.3 90.0  PLT 174 403   Basic Metabolic Panel: Recent Labs  Lab 05/19/22 1729 05/20/22 1030  NA 137 137  K 3.3* 4.1  CL 102 105  CO2 25 23  GLUCOSE 132* 138*  BUN 30* 33*  CREATININE 3.48* 4.14*  CALCIUM 8.4* 8.4*   GFR: CrCl cannot be calculated (Unknown ideal weight.). Liver Function Tests: Recent Labs  Lab 05/19/22 1729 05/20/22 1030  AST 19 15  ALT 14 12  ALKPHOS 134* 114  BILITOT 0.8 0.6  PROT 7.5 6.4*  ALBUMIN 3.7 2.9*   No results for input(s): "LIPASE", "AMYLASE" in the last 168 hours. No results for input(s): "AMMONIA" in the last 168 hours. Coagulation Profile: No results for input(s): "INR", "PROTIME" in the last 168 hours. Cardiac Enzymes: No results for input(s): "CKTOTAL", "CKMB", "CKMBINDEX", "TROPONINI" in the last 168 hours. BNP (last 3 results) No results for input(s): "PROBNP" in the last 8760 hours. HbA1C: No results for input(s): "HGBA1C" in the last 72 hours. CBG: No results for input(s): "GLUCAP" in the last 168 hours. Lipid Profile: No results for input(s): "CHOL", "HDL", "LDLCALC", "TRIG", "CHOLHDL", "LDLDIRECT" in the last 72 hours. Thyroid Function Tests: No results for  input(s): "TSH", "T4TOTAL", "FREET4", "T3FREE", "THYROIDAB" in the last 72 hours. Anemia Panel: No results for input(s): "VITAMINB12", "FOLATE", "FERRITIN", "TIBC", "IRON", "RETICCTPCT" in the last 72 hours. Sepsis Labs: No results for input(s): "PROCALCITON", "LATICACIDVEN" in the last 168 hours.  No results found for this or any previous visit (from the past 240 hour(s)).       Radiology Studies: No results found.      Scheduled Meds:  heparin  5,000 Units Subcutaneous Q8H   hydrALAZINE  100 mg Oral TID   sulfamethoxazole-trimethoprim  1 tablet Oral Daily   Continuous Infusions:  niCARDipine Stopped (05/20/22 1030)     LOS: 1 day    Time spent: 35 minutes     Barb Merino, MD [facility] Pager [phone]

## 2022-05-20 NOTE — ED Notes (Signed)
Per dayshift RN, report has been called to [name] [name] for transfer. This RN printed paper work for [facility] and sent her to [name] at this time.

## 2022-05-21 ENCOUNTER — Encounter (HOSPITAL_COMMUNITY): Payer: Self-pay | Admitting: Internal Medicine

## 2022-05-21 DIAGNOSIS — L819 Disorder of pigmentation, unspecified: Secondary | ICD-10-CM

## 2022-05-21 DIAGNOSIS — N3080 Other cystitis without hematuria: Secondary | ICD-10-CM

## 2022-05-21 DIAGNOSIS — M2042 Other hammer toe(s) (acquired), left foot: Secondary | ICD-10-CM

## 2022-05-21 DIAGNOSIS — E118 Type 2 diabetes mellitus with unspecified complications: Secondary | ICD-10-CM

## 2022-05-21 DIAGNOSIS — M2041 Other hammer toe(s) (acquired), right foot: Secondary | ICD-10-CM

## 2022-05-21 DIAGNOSIS — E876 Hypokalemia: Secondary | ICD-10-CM

## 2022-05-21 DIAGNOSIS — R42 Dizziness and giddiness: Secondary | ICD-10-CM

## 2022-05-21 DIAGNOSIS — I161 Hypertensive emergency: Principal | ICD-10-CM

## 2022-05-21 LAB — PHOSPHORUS: Phosphorus: 5 mg/dL — ABNORMAL HIGH (ref 2.5–4.6)

## 2022-05-21 LAB — HEPATITIS B SURFACE ANTIGEN: Hepatitis B Surface Ag: NONREACTIVE

## 2022-05-21 LAB — ALBUMIN: Albumin: 3.1 g/dL — ABNORMAL LOW (ref 3.5–5.0)

## 2022-05-21 MED ORDER — HEPARIN SODIUM (PORCINE) 1000 UNIT/ML IJ SOLN
INTRAMUSCULAR | Status: AC
  Administered 2022-05-21: 3000 [IU]
  Filled 2022-05-21: qty 4

## 2022-05-21 MED ORDER — CHLORHEXIDINE GLUCONATE CLOTH 2 % EX PADS: 6.0000 | MEDICATED_PAD | Freq: Every day | CUTANEOUS | Status: DC

## 2022-05-21 NOTE — Consult Note (Signed)
Reason for Consult:gangrene of toe Referring Physician: Dr [name] is an 56 y.o. female.  HPI: she has a history of type 2 diabetes and ESRD on HD. She was concerned about the discoloration in her toes. Also concerned about a toenail that felt loose.   Past Medical History:  Diagnosis Date   Colitis    Diabetes mellitus    ESRD (end stage renal disease) ([place])    on HD (M,W,F)   Family history of adverse reaction to anesthesia    " my uncle's heart stoped "   Fracture of femoral neck, right ([place]) 10/02/2020   GERD (gastroesophageal reflux disease)    Heart murmur    Hypertension    Impaired vision in both eyes 11/19/2015   Mitral regurgitation    Neuropathy in diabetes [facility])    Syncope and collapse 07/28/2015    Past Surgical History:  Procedure Laterality Date   ABDOMINAL HYSTERECTOMY  1999   for fibroids. she thinks one ovary was left   CATARACT EXTRACTION     CESAREAN SECTION     CHOLECYSTECTOMY     EYE SURGERY Left retinal   GANGLION CYST EXCISION     HIP PINNING,CANNULATED Right 10/02/2020   Procedure: CANNULATED HIP PINNING;  Surgeon: Altamese [place], MD;  Location: [place];  Service: Orthopedics;  Laterality: Right;   INCISION AND DRAINAGE ABSCESS N/A 04/10/2021   Procedure: INCISION AND DRAINAGE ABSCESS LABIA;  Surgeon: Jovita Kussmaul, MD;  Location: [place];  Service: General;  Laterality: N/A;   IR FLUORO GUIDE CV LINE LEFT  10/13/2020   IR PTA VENOUS EXCEPT DIALYSIS CIRCUIT  10/13/2020   ORIF TIBIA PLATEAU Left 10/09/2020   Procedure: OPEN REDUCTION INTERNAL FIXATION (ORIF) TIBIAL PLATEAU;  Surgeon: Altamese [place], MD;  Location: [place];  Service: Orthopedics;  Laterality: Left;   TONSILLECTOMY     TUBAL LIGATION      Family History  Problem Relation Age of Onset   Diabetes Father    Heart attack Father    Aneurysm Mother     Social History:  reports that she has never smoked. She has never used smokeless tobacco. She reports that she does not  drink alcohol and does not use drugs.  Allergies:  Allergies  Allergen Reactions   Amitriptyline Swelling    Other reaction(s): Confusion (intolerance)   Ciprofloxacin Diarrhea and Nausea And Vomiting    Severe vomiting requiring ED visit, IV reglan and IV fluids     Semaglutide Nausea Only   Tramadol Itching   Tylenol [Acetaminophen] Itching    Medications: I have reviewed the patient's current medications.  Results for orders placed or performed during the hospital encounter of 05/19/22 (from the past 48 hour(s))  Urine Culture     Status: Abnormal (Preliminary result)   Collection Time: 05/19/22  5:00 PM   Specimen: Urine, Catheterized  Result Value Ref Range   Specimen Description      URINE, CATHETERIZED Performed at [facility], [place] [address] [place], [place] [address]    Special Requests      NONE Performed at [facility], [place] [address] [place], [place] [address]    Culture (A)     >=100,000 COLONIES/mL PROTEUS MIRABILIS 80,000 COLONIES/mL KLEBSIELLA PNEUMONIAE    Report Status PENDING   Comprehensive metabolic panel     Status: Abnormal   Collection Time: 05/19/22  5:29 PM  Result Value Ref Range   Sodium 137 135 -  145 mmol/L   Potassium 3.3 (L) 3.5 - 5.1 mmol/L   Chloride 102 98 - 111 mmol/L   CO2 25 22 - 32 mmol/L   Glucose, Bld 132 (H) 70 - 99 mg/dL    Comment: Glucose reference range applies only to samples taken after fasting for at least 8 hours.   BUN 30 (H) 6 - 20 mg/dL   Creatinine, Ser 3.48 (H) 0.44 - 1.00 mg/dL   Calcium 8.4 (L) 8.9 - 10.3 mg/dL   Total Protein 7.5 6.5 - 8.1 g/dL   Albumin 3.7 3.5 - 5.0 g/dL   AST 19 15 - 41 U/L   ALT 14 0 - 44 U/L   Alkaline Phosphatase 134 (H) 38 - 126 U/L   Total Bilirubin 0.8 0.3 - 1.2 mg/dL   GFR, Estimated 15 (L) >60 mL/min    Comment: (NOTE) Calculated using the CKD-EPI Creatinine Equation (2021)    Anion gap 10 5 - 15    Comment: Performed at [facility], [place] [address] [place], [place] [address]  CBC with Differential     Status: Abnormal   Collection Time: 05/19/22  5:29 PM  Result Value Ref Range   WBC 8.6 4.0 - 10.5 K/uL   RBC 3.55 (L) 3.87 - 5.11 MIL/uL   Hemoglobin 9.7 (L) 12.0 - 15.0 g/dL   HCT 31.7 (L) 36.0 - 46.0 %   MCV 89.3 80.0 - 100.0 fL   MCH 27.3 26.0 - 34.0 pg   MCHC 30.6 30.0 - 36.0 g/dL   RDW 15.3 11.5 - 15.5 %   Platelets 174 150 - 400 K/uL   nRBC 0.0 0.0 - 0.2 %   Neutrophils Relative % 51 %   Neutro Abs 4.4 1.7 - 7.7 K/uL   Lymphocytes Relative 35 %   Lymphs Abs 3.0 0.7 - 4.0 K/uL   Monocytes Relative 11 %   Monocytes Absolute 1.0 0.1 - 1.0 K/uL   Eosinophils Relative 2 %   Eosinophils Absolute 0.2 0.0 - 0.5 K/uL   Basophils Relative 1 %   Basophils Absolute 0.1 0.0 - 0.1 K/uL   Immature Granulocytes 0 %   Abs Immature Granulocytes 0.03 0.00 - 0.07 K/uL    Comment: Performed at [facility], [place] [address] [place], [place] [address]  HIV Antibody (routine testing w rflx)     Status: None   Collection Time: 05/20/22 10:30 AM  Result Value Ref Range   HIV Screen 4th Generation wRfx Non Reactive Non Reactive    Comment: Performed at [facility], [place] [address], [place], [place] [address]  CBC     Status: Abnormal   Collection Time: 05/20/22 10:30 AM  Result Value Ref Range   WBC 8.2 4.0 - 10.5 K/uL   RBC 3.19 (L) 3.87 - 5.11 MIL/uL   Hemoglobin 8.8 (L) 12.0 - 15.0 g/dL   HCT 28.7 (L) 36.0 - 46.0 %   MCV 90.0 80.0 - 100.0 fL   MCH 27.6 26.0 - 34.0 pg   MCHC 30.7 30.0 - 36.0 g/dL   RDW 15.5 11.5 - 15.5 %   Platelets 173 150 - 400 K/uL   nRBC 0.0 0.0 - 0.2 %    Comment: Performed at [facility], [place] [address], [place], [place] [address]  Comprehensive metabolic panel     Status: Abnormal   Collection Time: 05/20/22 10:30 AM  Result Value Ref Range   Sodium 137 135 - 145  mmol/L   Potassium 4.1 3.5 - 5.1 mmol/L   Chloride 105 98 - 111  mmol/L   CO2 23 22 - 32 mmol/L   Glucose, Bld 138 (H) 70 - 99 mg/dL    Comment: Glucose reference range applies only to samples taken after fasting for at least 8 hours.   BUN 33 (H) 6 - 20 mg/dL   Creatinine, Ser 4.14 (H) 0.44 - 1.00 mg/dL   Calcium 8.4 (L) 8.9 - 10.3 mg/dL   Total Protein 6.4 (L) 6.5 - 8.1 g/dL   Albumin 2.9 (L) 3.5 - 5.0 g/dL   AST 15 15 - 41 U/L   ALT 12 0 - 44 U/L   Alkaline Phosphatase 114 38 - 126 U/L   Total Bilirubin 0.6 0.3 - 1.2 mg/dL   GFR, Estimated 12 (L) >60 mL/min    Comment: (NOTE) Calculated using the CKD-EPI Creatinine Equation (2021)    Anion gap 9 5 - 15    Comment: Performed at [facility], [place] [address], [place], [place] [address]    No results found.  Review of Systems  Constitutional:  Negative for chills, fever and malaise/fatigue.  Skin:        Toe discoloration   Blood pressure (!) 188/76, pulse 75, temperature 98.3 F (36.8 C), temperature source Oral, resp. rate 15, height 5' (1.524 m), weight 65.6 kg, SpO2 98 %.  Vitals:   05/21/22 1200 05/21/22 1248  BP: (!) 188/76 (!) 188/76  Pulse: 75   Resp: 15   Temp: 98.3 F (36.8 C)   SpO2: 98%     General AA&O x3. Normal mood and affect.  Vascular Dorsalis pedis and posterior tibial pulses  barely palpable bilaterally  Capillary refill normal to all digits. Pedal hair growth diminished.  Neurologic Epicritic sensation grossly absent.  Dermatologic (Wound) Nails well attached, severe mycosis and dystrophy. Discoloration dorsal 2nd PIPJ with corn present, no drainage or ulcer or SOI, capillary fill time here is good  Orthopedic: Motor intact BLE.    Assessment/Plan:  Suspect the discoloration is hyperpigmentation secondary to large hyperkeratosis with hammertoe deformity. She will need outpatient follow up for diabetic foot exam and care. No surgical plans. I did recommend ABI due to weak pulse exam and to r/o vascular cause of discoloration, have low  suspicion. If ABI is unconcerning can d/c and f/u as OP with [place] at our office.   [name] [name] 05/21/2022, 3:22 PM   Best available via secure chat for questions or concerns.

## 2022-05-21 NOTE — Consult Note (Signed)
Renal Service Consult Note [facility]  [name] [name] 05/21/2022 [name], MD Requesting Physician: Dr. Tana Coast  Reason for Consult: ESRD pt w/ HTNsive urgency HPI: The patient is a 56 y.o. year-old w/ PMH as below presented to ED c/o "full bladder", has had catheter before for these symptoms. Unable to void in about 2 wks. Had full HD the day before (Wed) at her HD center in [facility]. In ED BP's very high at 254/97, pt had not been taking her BP meds due to abd pain and nausea. Pt was started on Clevidipine IV then this was weaned off after hydralazine started at 100mg  po tid. Pt now admitted and BP's better. Asked to see for dialysis.    Pt lives w/ her husband. Takes [employer] transportation to get to HD tiw.   ROS - denies CP, no joint pain, no HA, no blurry vision, no rash, no diarrhea, no nausea/ vomiting, no dysuria, no difficulty voiding   Past Medical History  Past Medical History:  Diagnosis Date   Colitis    Diabetes mellitus    ESRD (end stage renal disease) ([place])    on HD (M,W,F)   Family history of adverse reaction to anesthesia    " my uncle's heart stoped "   Fracture of femoral neck, right ([place]) 10/02/2020   GERD (gastroesophageal reflux disease)    Heart murmur    Hypertension    Impaired vision in both eyes 11/19/2015   Mitral regurgitation    Neuropathy in diabetes ([place])    Syncope and collapse 07/28/2015   Past Surgical History  Past Surgical History:  Procedure Laterality Date   ABDOMINAL HYSTERECTOMY  1999   for fibroids. she thinks one ovary was left   CATARACT EXTRACTION     CESAREAN SECTION     CHOLECYSTECTOMY     EYE SURGERY Left retinal   GANGLION CYST EXCISION     HIP PINNING,CANNULATED Right 10/02/2020   Procedure: CANNULATED HIP PINNING;  Surgeon: Altamese Limon, MD;  Location: [place];  Service: Orthopedics;  Laterality: Right;   INCISION AND DRAINAGE ABSCESS N/A 04/10/2021   Procedure: INCISION AND DRAINAGE ABSCESS  LABIA;  Surgeon: Jovita Kussmaul, MD;  Location: [place];  Service: General;  Laterality: N/A;   IR FLUORO GUIDE CV LINE LEFT  10/13/2020   IR PTA VENOUS EXCEPT DIALYSIS CIRCUIT  10/13/2020   ORIF TIBIA PLATEAU Left 10/09/2020   Procedure: OPEN REDUCTION INTERNAL FIXATION (ORIF) TIBIAL PLATEAU;  Surgeon: Altamese , MD;  Location: [place];  Service: Orthopedics;  Laterality: Left;   TONSILLECTOMY     TUBAL LIGATION     Family History  Family History  Problem Relation Age of Onset   Diabetes Father    Heart attack Father    Aneurysm Mother    Social History  reports that she has never smoked. She has never used smokeless tobacco. She reports that she does not drink alcohol and does not use drugs. Allergies  Allergies  Allergen Reactions   Amitriptyline Swelling    Other reaction(s): Confusion (intolerance)   Ciprofloxacin Diarrhea and Nausea And Vomiting    Severe vomiting requiring ED visit, IV reglan and IV fluids     Semaglutide Nausea Only   Tramadol Itching   Tylenol [Acetaminophen] Itching   Home medications Prior to Admission medications   Medication Sig Start Date End Date Taking? Authorizing Provider  BISACODYL PO Take 5 mg by mouth daily as needed (constipation).  Yes [provider]  Calcium Carbonate Antacid (TUMS PO) Take 1 Dose by mouth 3 (three) times daily with meals.   Yes [provider]  hydrALAZINE (APRESOLINE) 100 MG tablet Take 100 mg by mouth 3 (three) times daily.   Yes [provider]  lidocaine (LIDODERM) 5 % Place 1 patch onto the skin daily. Remove & Discard patch within 12 hours or as directed by MD 10/16/20  Yes Alexandria Lodge, MD  amoxicillin-clavulanate (AUGMENTIN) 500-125 MG tablet Take 1 tablet (500 mg total) by mouth 2 (two) times daily. Patient not taking: Reported on 05/19/2022 04/14/21   Elgergawy, Silver Huguenin, MD  senna-docusate (SENOKOT-S) 8.6-50 MG tablet Take 2 tablets by mouth 2 (two) times daily. Patient not  taking: Reported on 04/08/2021 10/16/20   Alexandria Lodge, MD  SUMAtriptan (IMITREX) 100 MG tablet TAKE 1 TABLET (100 MG TOTAL) BY MOUTH ONCE AS NEEDED FOR MIGRAINE. MAY REPEAT IN 2 HOURS IF HEADACHE PERSISTS OR RECURS. Patient not taking: No sig reported 06/03/20 06/03/21  [place] Eric J, DO     Vitals:   05/21/22 0500 05/21/22 0859 05/21/22 1200 05/21/22 1248  BP:  129/75 (!) 188/76 (!) 188/76  Pulse:   75   Resp:   15   Temp:  98.3 F (36.8 C) 98.3 F (36.8 C)   TempSrc:  Oral Oral   SpO2:   98%   Weight: 65.6 kg     Height:       Exam Gen alert, no distress, chronically ill appearing No rash, cyanosis or gangrene Sclera anicteric, throat clear  No jvd or bruits Chest clear bilat to bases, no rales/ wheezing RRR no MRG Abd soft ntnd no mass or ascites +bs GU defer MS no joint effusions or deformity Ext diffuse mild-mod pretib+ hip edema, no wounds or ulcers Neuro is alert, Ox 3 , nf          Home meds include - hydralazine 100 tid     OP HD: MWF [facility]  10/22 > needs updating: 3.5h  350/600  2/2.5 bath  TDC  Heparin 5000+ 500u/hr   Assessment/ Plan: HTN'sive emergency - BP 247/95, started on Cardene gtt in ED. Due to nausea/ abd pain pt has been unable to take pills. Cardene weaned off and taking po hydralazine 100 tid here w/ improved BP's.   ESRD-  HD MWF. Next HD today  Volume - mild diffuse LE edema, clear lungs. UF goal 3 L today.   Anemia - Hgb 8-9.5. follow, transfuse prn. Get records.  MBD ckd - CCa in range, add on phos. Get records.   Nutrition- Alb low, on protein supplements.  T2DM- per primary service      Kelly Splinter  MD 05/21/2022, 1:11 PM Recent Labs  Lab 05/19/22 1729 05/20/22 1030  HGB 9.7* 8.8*  ALBUMIN 3.7 2.9*  CALCIUM 8.4* 8.4*  CREATININE 3.48* 4.14*  K 3.3* 4.1   Inpatient medications:  Chlorhexidine Gluconate Cloth  6 each Topical Daily   heparin  5,000 Units Subcutaneous Q8H   hydrALAZINE  100 mg Oral TID    sulfamethoxazole-trimethoprim  1 tablet Oral Daily    niCARDipine Stopped (05/20/22 1030)   calcium carbonate (dosed in mg elemental calcium), camphor-menthol **AND** hydrOXYzine, docusate sodium, feeding supplement (NEPRO CARB STEADY), gabapentin, hydrALAZINE, ondansetron **OR** ondansetron (ZOFRAN) IV, sorbitol, zolpidem

## 2022-05-21 NOTE — Plan of Care (Signed)
  Problem: Education: Goal: Knowledge of General Education information will improve Description: Including pain rating scale, medication(s)/side effects and non-pharmacologic comfort measures Outcome: Progressing   Problem: Elimination: Goal: Will not experience complications related to urinary retention Outcome: Progressing   

## 2022-05-21 NOTE — Plan of Care (Signed)

## 2022-05-21 NOTE — Progress Notes (Signed)
[facility]                                                                              [name] [name], is a 56 y.o. female, DOB - [birth year], FXT:[id number] Admit date - 05/19/2022    Outpatient Primary MD for the patient is Myrtis Hopping., MD  LOS - 2  days  Chief Complaint  Patient presents with   Urinary Retention       Brief summary   56 year old female with history of ESRD on hemodialysis Monday Wednesday Friday through left chest wall permacath, type II controlled diabetes on diet control, history of stroke with residual weakness, currently wheelchair-bound, GERD, hypertension who does have significant history of Pyoscystitis that requires bladder irrigation at least 3 times a day.  She has been in and out of the hospital due to this.  She develops a lot of blood clot and mucus debris if not irrigated.  She usually have Foley catheter, seen by urology in follow-up, patient requested to remove Foley catheter so she was sent home to do a straight cath 3 times a day and flush her bladder that she cannot do it and her husband is also unable to do it.  Patient started developing suprapubic discomfort so came to the emergency room. In the emergency room she was noted to have blood pressure of 247/95, Cardene drip was initiated. Foley catheter was placed and CBI was started, case was discussed with [facility] urology as noted in the ER doctor's note.   Assessment & Plan    Principal Problem: Hypertensive emergency -In ED, was noted to have BP of 247/95, was started on Cardene drip.  Patient had been unable to take p.o. meds due to abdominal pain and nausea. -BP now improving, was started on hydralazine 100 mg 3 times daily and Cardene was weaned off. - will continue to improve with dialysis today.  Active Problems:   ESRD (end stage renal disease) ([place]) on HD, MWF -Last HD on Wednesday 11/15 -Nephrology consulted plan for HD today  History of neurogenic bladder  and pyocystis -Patient receives urology care at [facility], reviewed records in care everywhere.  Foley was removed on 05/07/2022, recommended irrigation of bladder with catheter.  -Needs home health nurse to irrigate at least daily, patient reports that she does not need irrigation 3 times a day -TOC consulted  Bilateral second toe dry gangrene -Patient requesting podiatry evaluation -Discussed with on-call podiatry, Dr. Sherryle Lis, recommended ABIs and will evaluate     GERD (gastroesophageal reflux disease) -Continue PPI     Diabetes mellitus with complication ([place]) -Not on any medications   Code Status: Full code DVT Prophylaxis:  heparin injection 5,000 Units Start: 05/20/22 0930   Level of Care: Level of care: Telemetry Medical Family Communication: Updated patient  Disposition Plan:      Remains inpatient appropriate: Needs HD today, podiatry evaluation, home health nurse arranged   Procedures:  None  Consultants:   Nephrology, podiatry  Antimicrobials:   Anti-infectives (From admission, onward)    Start     Dose/Rate Route Frequency Ordered Stop   05/20/22 1430  sulfamethoxazole-trimethoprim (BACTRIM) 400-80 MG per tablet 1 tablet        1 tablet Oral Daily 05/20/22 1334 05/27/22 0959   05/19/22 1915  sulfamethoxazole-trimethoprim (BACTRIM DS) 800-160 MG per tablet 1 tablet        1 tablet Oral  Once 05/19/22 1914 05/19/22 1942   05/19/22 1915  amoxicillin (AMOXIL) capsule 500 mg        500 mg Oral  Once 05/19/22 1914 05/20/22 0906          Medications  Chlorhexidine Gluconate Cloth  6 each Topical Daily   heparin  5,000 Units Subcutaneous Q8H   hydrALAZINE  100 mg Oral TID   sulfamethoxazole-trimethoprim  1 tablet Oral Daily      Subjective:   Asencion Guisinger was seen and examined today.  No acute complaints, concerned about dry gangrene on second toes bilateral feet and toenails.  Also requesting home health nurse to be arranged.      Objective:   Vitals:   05/20/22 2205 05/21/22 0400 05/21/22 0500 05/21/22 0859  BP: (!) 152/71   129/75  Pulse: 70     Resp: 14 16    Temp: 98 F (36.7 C) 98.6 F (37 C)  98.3 F (36.8 C)  TempSrc: Oral Oral  Oral  SpO2: 99%     Weight:   65.6 kg   Height:        Intake/Output Summary (Last 24 hours) at 05/21/2022 1139 Last data filed at 05/21/2022 0702 Gross per 24 hour  Intake 720 ml  Output 825 ml  Net -105 ml     Wt Readings from Last 3 Encounters:  05/21/22 65.6 kg  04/14/21 67.6 kg  01/06/21 61.2 kg     Exam General: Alert and oriented x 3, NAD Cardiovascular: S1 S2 auscultated,  RRR Respiratory: Clear to auscultation bilaterally Gastrointestinal: Soft, nontender, nondistended, + bowel sounds Ext: no pedal edema bilaterally, discoloration noted on bilateral second toe Neuro: no new deficits, decrease sensation bilateral lower extremity with flaccidity Psych: Normal affect and demeanor, alert and oriented x3     Data Reviewed:  I have personally reviewed following labs    CBC Lab Results  Component Value Date   WBC 8.2 05/20/2022   RBC 3.19 (L) 05/20/2022   HGB 8.8 (L) 05/20/2022   HCT 28.7 (L) 05/20/2022   MCV 90.0 05/20/2022   MCH 27.6 05/20/2022   PLT 173 05/20/2022   MCHC 30.7 05/20/2022   RDW 15.5 05/20/2022   LYMPHSABS 3.0 05/19/2022   MONOABS 1.0 05/19/2022   EOSABS 0.2 05/19/2022   BASOSABS 0.1 79/89/2119     Last metabolic panel Lab Results  Component Value Date   NA 137 05/20/2022   K 4.1 05/20/2022   CL 105 05/20/2022   CO2 23 05/20/2022   BUN 33 (H) 05/20/2022   CREATININE 4.14 (H) 05/20/2022   GLUCOSE 138 (H) 05/20/2022   GFRNONAA 12 (L) 05/20/2022   GFRAA 10 (L) 03/14/2020   CALCIUM 8.4 (L) 05/20/2022   PHOS 3.5 04/09/2021   PROT 6.4 (L) 05/20/2022   ALBUMIN 2.9 (L) 05/20/2022   BILITOT 0.6 05/20/2022   ALKPHOS 114 05/20/2022   AST 15 05/20/2022   ALT 12 05/20/2022   ANIONGAP 9 05/20/2022       Radiology Studies: I have personally reviewed the imaging studies  No results found.     Estill Cotta M.D. [facility] 05/21/2022, 11:39 AM  Available via Epic secure chat 7am-7pm After 7 pm, please refer  to night coverage provider listed on amion.

## 2022-05-22 ENCOUNTER — Inpatient Hospital Stay (HOSPITAL_COMMUNITY): Payer: Medicare Other

## 2022-05-22 DIAGNOSIS — L819 Disorder of pigmentation, unspecified: Secondary | ICD-10-CM

## 2022-05-22 DIAGNOSIS — K219 Gastro-esophageal reflux disease without esophagitis: Secondary | ICD-10-CM

## 2022-05-22 DIAGNOSIS — N186 End stage renal disease: Secondary | ICD-10-CM

## 2022-05-22 LAB — URINE CULTURE: Culture: >=100000 — AB

## 2022-05-22 MED ORDER — HYDRALAZINE HCL 100 MG PO TABS: 100.0000 mg | ORAL_TABLET | Freq: Three times a day (TID) | ORAL | 3 refills | Status: DC

## 2022-05-22 MED ORDER — DIPHENHYDRAMINE HCL 25 MG PO CAPS
25.0000 mg | ORAL_CAPSULE | Freq: Once | ORAL | Status: AC
  Administered 2022-05-22: 25 mg via ORAL
  Filled 2022-05-22: qty 1

## 2022-05-22 MED ORDER — HYDROXYZINE HCL 25 MG PO TABS: 25.0000 mg | ORAL_TABLET | Freq: Three times a day (TID) | ORAL | 0 refills | Status: DC | PRN

## 2022-05-22 MED ORDER — AMLODIPINE BESYLATE 10 MG PO TABS: 10.0000 mg | ORAL_TABLET | Freq: Every day | ORAL | 3 refills | Status: DC

## 2022-05-22 MED ORDER — ONDANSETRON HCL 4 MG PO TABS: 4.0000 mg | ORAL_TABLET | Freq: Three times a day (TID) | ORAL | 0 refills | Status: DC | PRN

## 2022-05-22 MED ORDER — CEPHALEXIN 250 MG PO CAPS
250.0000 mg | ORAL_CAPSULE | Freq: Two times a day (BID) | ORAL | Status: DC
  Administered 2022-05-22: 250 mg via ORAL
  Filled 2022-05-22 (×2): qty 1

## 2022-05-22 MED ORDER — CEPHALEXIN 250 MG PO CAPS: 250.0000 mg | ORAL_CAPSULE | Freq: Two times a day (BID) | ORAL | 0 refills | Status: AC

## 2022-05-22 MED ORDER — AMLODIPINE BESYLATE 10 MG PO TABS
10.0000 mg | ORAL_TABLET | Freq: Every day | ORAL | Status: DC
  Administered 2022-05-22: 10 mg via ORAL
  Filled 2022-05-22: qty 1

## 2022-05-22 NOTE — Consult Note (Signed)
Urology Consult    Reason for consult: history of pyocystis  History of Present Illness: [name] [name] is a 56 y.o. who was admitted to [facility] with a hypertensive emergency.   Patient has a history of pyocystis and is established at [facility] urology. She has ESRD and makes very little urine. She last saw her urologist 11/3 at which time a foley was removed. She had bothersome suprapubic discomfort soon thereafter.   A catheter was replaced for this admission. At the time of my exam it has a small amount of drainage with some purulent debris.   Past Medical History:  Diagnosis Date   Colitis    Diabetes mellitus    ESRD (end stage renal disease) ([place])    on HD (M,W,F)   Family history of adverse reaction to anesthesia    " my uncle's heart stoped "   Fracture of femoral neck, right ([place]) 10/02/2020   GERD (gastroesophageal reflux disease)    Heart murmur    Hypertension    Impaired vision in both eyes 11/19/2015   Mitral regurgitation    Neuropathy in diabetes [facility])    Syncope and collapse 07/28/2015    Past Surgical History:  Procedure Laterality Date   ABDOMINAL HYSTERECTOMY  1999   for fibroids. she thinks one ovary was left   CATARACT EXTRACTION     CESAREAN SECTION     CHOLECYSTECTOMY     EYE SURGERY Left retinal   GANGLION CYST EXCISION     HIP PINNING,CANNULATED Right 10/02/2020   Procedure: CANNULATED HIP PINNING;  Surgeon: Altamese Pangburn, MD;  Location: [place];  Service: Orthopedics;  Laterality: Right;   INCISION AND DRAINAGE ABSCESS N/A 04/10/2021   Procedure: INCISION AND DRAINAGE ABSCESS LABIA;  Surgeon: Jovita Kussmaul, MD;  Location: [place];  Service: General;  Laterality: N/A;   IR FLUORO GUIDE CV LINE LEFT  10/13/2020   IR PTA VENOUS EXCEPT DIALYSIS CIRCUIT  10/13/2020   ORIF TIBIA PLATEAU Left 10/09/2020   Procedure: OPEN REDUCTION INTERNAL FIXATION (ORIF) TIBIAL PLATEAU;  Surgeon: Altamese Edmonds, MD;  Location: [place];  Service: Orthopedics;  Laterality: Left;    TONSILLECTOMY     TUBAL LIGATION       Current Hospital Medications:  Home meds:  No current facility-administered medications on file prior to encounter.   Current Outpatient Medications on File Prior to Encounter  Medication Sig Dispense Refill   BISACODYL PO Take 5 mg by mouth daily as needed (constipation).     Calcium Carbonate Antacid (TUMS PO) Take 1 Dose by mouth 3 (three) times daily with meals.     lidocaine (LIDODERM) 5 % Place 1 patch onto the skin daily. Remove & Discard patch within 12 hours or as directed by MD 7 patch 0   amoxicillin-clavulanate (AUGMENTIN) 500-125 MG tablet Take 1 tablet (500 mg total) by mouth 2 (two) times daily. (Patient not taking: Reported on 05/19/2022) 8 tablet 0   senna-docusate (SENOKOT-S) 8.6-50 MG tablet Take 2 tablets by mouth 2 (two) times daily. (Patient not taking: Reported on 04/08/2021) 28 tablet 0   SUMAtriptan (IMITREX) 100 MG tablet TAKE 1 TABLET (100 MG TOTAL) BY MOUTH ONCE AS NEEDED FOR MIGRAINE. MAY REPEAT IN 2 HOURS IF HEADACHE PERSISTS OR RECURS. (Patient not taking: No sig reported) 30 tablet 0     Scheduled Meds:  amLODipine  10 mg Oral Daily   cephALEXin  250 mg Oral Q12H   Chlorhexidine Gluconate Cloth  6 each Topical  Q0600   heparin  5,000 Units Subcutaneous Q8H   hydrALAZINE  100 mg Oral TID   Continuous Infusions: PRN Meds:.calcium carbonate (dosed in mg elemental calcium), camphor-menthol **AND** hydrOXYzine, docusate sodium, feeding supplement (NEPRO CARB STEADY), gabapentin, hydrALAZINE, ondansetron **OR** ondansetron (ZOFRAN) IV, sorbitol, zolpidem  Allergies:  Allergies  Allergen Reactions   Amitriptyline Swelling    Other reaction(s): Confusion (intolerance)   Ciprofloxacin Diarrhea and Nausea And Vomiting    Severe vomiting requiring ED visit, IV reglan and IV fluids     Semaglutide Nausea Only   Tramadol Itching   Tylenol [Acetaminophen] Itching    Family History  Problem Relation Age of Onset    Diabetes Father    Heart attack Father    Aneurysm Mother     Social History:  reports that she has never smoked. She has never used smokeless tobacco. She reports that she does not drink alcohol and does not use drugs.  ROS: A complete review of systems was performed.  All systems are negative except for pertinent findings as noted.  Physical Exam:  Vital signs in last 24 hours: Temp:  [98 F (36.7 C)-98.7 F (37.1 C)] 98.7 F (37.1 C) (11/18 1241) Pulse Rate:  [62-98] 78 (11/18 1241) Resp:  [12-23] 19 (11/18 1241) BP: (110-222)/(67-125) 222/91 (11/18 1241) SpO2:  [97 %-100 %] 99 % (11/18 1241) Weight:  [61 kg-63.5 kg] 61.5 kg (11/18 0500) Constitutional:  Alert and oriented, No acute distress Cardiovascular: Regular rate and rhythm Respiratory: Normal respiratory effort, Lungs clear bilaterally GI: Abdomen is soft, nontender, nondistended, no abdominal masses GU: No CVA tenderness; foley in place with small amount of urine with purulent debris Neurologic: Grossly intact, no focal deficits Psychiatric: Normal mood and affect  Laboratory Data:  Recent Labs    05/19/22 1729 05/20/22 1030  WBC 8.6 8.2  HGB 9.7* 8.8*  HCT 31.7* 28.7*  PLT 174 173    Recent Labs    05/19/22 1729 05/20/22 1030  NA 137 137  K 3.3* 4.1  CL 102 105  GLUCOSE 132* 138*  BUN 30* 33*  CALCIUM 8.4* 8.4*  CREATININE 3.48* 4.14*     Results for orders placed or performed during the hospital encounter of 05/19/22 (from the past 24 hour(s))  Hepatitis B surface antigen     Status: None   Collection Time: 05/21/22  6:39 PM  Result Value Ref Range   Hepatitis B Surface Ag NON REACTIVE NON REACTIVE  Albumin     Status: Abnormal   Collection Time: 05/21/22  6:39 PM  Result Value Ref Range   Albumin 3.1 (L) 3.5 - 5.0 g/dL  Phosphorus     Status: Abnormal   Collection Time: 05/21/22  6:39 PM  Result Value Ref Range   Phosphorus 5.0 (H) 2.5 - 4.6 mg/dL   Recent Results (from the past 240  hour(s))  Urine Culture     Status: Abnormal   Collection Time: 05/19/22  5:00 PM   Specimen: Urine, Catheterized  Result Value Ref Range Status   Specimen Description   Final    URINE, CATHETERIZED Performed at [facility], [place] [address], [place], [place] [address]    Special Requests   Final    NONE Performed at [facility], [place] [address] [place], [place] [address]    Culture (A)  Final    >=100,000 COLONIES/mL PROTEUS MIRABILIS 80,000 COLONIES/mL KLEBSIELLA PNEUMONIAE    Report Status 05/22/2022 FINAL  Final   Organism  ID, Bacteria PROTEUS MIRABILIS (A)  Final   Organism ID, Bacteria KLEBSIELLA PNEUMONIAE (A)  Final      Susceptibility   Klebsiella pneumoniae - MIC*    AMPICILLIN >=32 RESISTANT Resistant     CEFAZOLIN <=4 SENSITIVE Sensitive     CEFEPIME <=0.12 SENSITIVE Sensitive     CEFTRIAXONE <=0.25 SENSITIVE Sensitive     CIPROFLOXACIN <=0.25 SENSITIVE Sensitive     GENTAMICIN <=1 SENSITIVE Sensitive     IMIPENEM <=0.25 SENSITIVE Sensitive     NITROFURANTOIN <=16 SENSITIVE Sensitive     TRIMETH/SULFA <=20 SENSITIVE Sensitive     AMPICILLIN/SULBACTAM 4 SENSITIVE Sensitive     PIP/TAZO <=4 SENSITIVE Sensitive     * 80,000 COLONIES/mL KLEBSIELLA PNEUMONIAE   Proteus mirabilis - MIC*    AMPICILLIN <=2 SENSITIVE Sensitive     CEFAZOLIN <=4 SENSITIVE Sensitive     CEFEPIME <=0.12 SENSITIVE Sensitive     CEFTRIAXONE <=0.25 SENSITIVE Sensitive     CIPROFLOXACIN <=0.25 SENSITIVE Sensitive     GENTAMICIN <=1 SENSITIVE Sensitive     IMIPENEM 1 SENSITIVE Sensitive     NITROFURANTOIN 128 RESISTANT Resistant     TRIMETH/SULFA <=20 SENSITIVE Sensitive     AMPICILLIN/SULBACTAM <=2 SENSITIVE Sensitive     PIP/TAZO <=4 SENSITIVE Sensitive     * >=100,000 COLONIES/mL PROTEUS MIRABILIS    Renal Function: Recent Labs    05/19/22 1729 05/20/22 1030  CREATININE 3.48* 4.14*   Estimated Creatinine Clearance: 12.6 mL/min (A) (by C-G  formula based on SCr of 4.14 mg/dL (H)).  Radiologic Imaging: VAS [place] ABI WITH/WO TBI  Result Date: 05/22/2022  LOWER EXTREMITY DOPPLER STUDY Patient Name:  ARETTA STETZEL  Date of Exam:   05/22/2022 Medical Rec #: [id number]      Accession #:    [id number] Date of Birth: 02/19/1966      Patient Gender: F Patient Age:   67 years Exam Location:  [facility] Procedure:      VAS [place] ABI WITH/WO TBI Referring Phys: RIPUDEEP RAI --------------------------------------------------------------------------------  Indications: Bilateral second toe discoloration. Per podiatry, "Suspect the              discoloration is hyperpigmentation secondary to large              hyperkeratosis with hammertoe deformity." High Risk Factors: Hypertension, Diabetes, prior CVA. Other Factors: ESRD on dialysis, wheelchair bound.  Comparison Study: No prior study Performing Technologist: Sharion Dove RVS  Examination Guidelines: A complete evaluation includes at minimum, Doppler waveform signals and systolic blood pressure reading at the level of bilateral brachial, anterior tibial, and posterior tibial arteries, when vessel segments are accessible. Bilateral testing is considered an integral part of a complete examination. Photoelectric Plethysmograph (PPG) waveforms and toe systolic pressure readings are included as required and additional duplex testing as needed. Limited examinations for reoccurring indications may be performed as noted.  ABI Findings: +---------+------------------+-----+-----------+-----------+ Right    Rt Pressure (mmHg)IndexWaveform   Comment     +---------+------------------+-----+-----------+-----------+ Brachial                        triphasic  IV/bandages +---------+------------------+-----+-----------+-----------+ PTA      192               1.03 multiphasic            +---------+------------------+-----+-----------+-----------+ DP       170               0.91 multiphasic             +---------+------------------+-----+-----------+-----------+  Great Toe109               0.58                        +---------+------------------+-----+-----------+-----------+ +---------+------------------+-----+-----------+-------+ Left     Lt Pressure (mmHg)IndexWaveform   Comment +---------+------------------+-----+-----------+-------+ Brachial 187                    triphasic          +---------+------------------+-----+-----------+-------+ PTA      198               1.06 multiphasic        +---------+------------------+-----+-----------+-------+ DP       163               0.87 multiphasic        +---------+------------------+-----+-----------+-------+ Great Toe114               0.61                    +---------+------------------+-----+-----------+-------+ +-------+-----------+-----------+------------+------------+ ABI/TBIToday's ABIToday's TBIPrevious ABIPrevious TBI +-------+-----------+-----------+------------+------------+ Right  1.03       0.58                                +-------+-----------+-----------+------------+------------+ Left   1.06       0.61                                +-------+-----------+-----------+------------+------------+  Summary: Right: Resting right ankle-brachial index is within normal range. The right toe-brachial index is abnormal. Left: Resting left ankle-brachial index is within normal range. The left toe-brachial index is abnormal. *See table(s) above for measurements and observations.  Electronically signed by Deitra Mayo MD on 05/22/2022 at 12:45:50 PM.    Final     I independently reviewed the above imaging studies.  Impression/Recommendation: 56 yo F with history of pyocystis. She is nearing discharge for her acute issues. I would recommend discharging her with her catheter in place with plans to f/u with her urologist at [facility]. We briefly discussed long term management of pyocystis and  possible operative interventions, but she is understandably hesitant at this time.   Donald Pore 05/22/2022, 12:57 PM  [facility]  Pager: [phone]

## 2022-05-22 NOTE — Progress Notes (Signed)
Patient alert and oriented. No distress noted, no complaints voiced. IV x 2 removed. Patient transferred from bed to stretcher by [facility] staff. Patient discharged to home, no questions or concerns voiced from patient.

## 2022-05-22 NOTE — Progress Notes (Signed)
[facility]                                                                              [name] [name], is a 56 y.o. 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  She has been in and out of the hospital due to this.  She develops a lot of blood clot and mucus debris if not irrigated.  She usually have Foley catheter, seen by urology, Dr Marnee Guarneri in follow-up on 11/3, patient requested to remove Foley catheter so she was sent home to do a straight cath 3 times a day and flush her bladder that she cannot do it and her husband is also unable to do it.  Patient started developing suprapubic discomfort so came to the emergency room. In the emergency room she was noted to have blood pressure of 247/95, Cardene drip was initiated. Foley catheter was reinserted and CBI was started by EDP after discussing with [facility] urology   Assessment & Plan    Principal Problem: Hypertensive emergency -In ED, was noted to have BP of 247/95, was started on Cardene drip.  Patient had been unable to take p.o. meds due to abdominal pain and nausea. -BP now improving, was started on hydralazine 100 mg 3 times daily and Cardene was weaned off. -BP continues to be elevated, started on Norvasc 10 mg daily   Active Problems:  ESRD (end stage renal disease) ([place]) on HD, MWF -Last HD on Wednesday 11/15 -Nephrology following, underwent HD on 7/17  History of  neurogenic bladder and pyocystis, complicated UTI -Patient receives urology care at [facility], reviewed records in care everywhere.  Foley was removed on 05/07/2022, recommended irrigation of bladder with catheter. However patient was unable to do CIC and irrigation, she was not able to procure home health nurse, then developed suprapubic discomfort.  In ED at [facility], Foley catheter was reinserted and 3 L of saline was irrigated leading to removal of clots and purulent material. -Patient has Foley catheter in place now  -Urine culture showed >100K colonies of Proteus Mirabilis, 80,000 colonies of Klebsiella started on Keflex.  Per patient, she has significant itching with Bactrim hence discontinued. -Patient has been declined from several home health services/agencies and unable to obtain daily [facility] RN for irrigation.  -Requested urology on-call, Dr Cain Sieve for recommendations.    GERD (gastroesophageal reflux disease) -Continue PPI   Diabetes mellitus with complication (HCC) -Not on any medications  Discoloration on the toes -ABIs normal, seen by podiatry, hyperpigmentation secondary to large hyperkeratosis with hammertoe deformity. -Recommended outpatient follow-up    Code  Status: Full code DVT Prophylaxis:  heparin injection 5,000 Units Start: 05/20/22 0930   Level of Care: Level of care: Telemetry Medical Family Communication: Updated patient  Disposition Plan:      Remains inpatient appropriate: Possible DC home today  Procedures:  None  Consultants:   Nephrology, podiatry Urology  Antimicrobials:   Anti-infectives (From admission, onward)    Start     Dose/Rate Route Frequency Ordered Stop   05/22/22 1230  cephALEXin (KEFLEX) capsule 250 mg        250 mg Oral Every 12 hours 05/22/22 1132     05/22/22 0000  cephALEXin (KEFLEX) 250 MG capsule        250 mg Oral 2 times daily 05/22/22 1210 05/29/22 2359   05/20/22 1430  sulfamethoxazole-trimethoprim (BACTRIM)  400-80 MG per tablet 1 tablet  Status:  Discontinued        1 tablet Oral Daily 05/20/22 1334 05/22/22 1108   05/19/22 1915  sulfamethoxazole-trimethoprim (BACTRIM DS) 800-160 MG per tablet 1 tablet        1 tablet Oral  Once 05/19/22 1914 05/19/22 1942   05/19/22 1915  amoxicillin (AMOXIL) capsule 500 mg        500 mg Oral  Once 05/19/22 1914 05/20/22 0906          Medications  cephALEXin  250 mg Oral Q12H   Chlorhexidine Gluconate Cloth  6 each Topical Q0600   diphenhydrAMINE  25 mg Oral Once   heparin  5,000 Units Subcutaneous Q8H   hydrALAZINE  100 mg Oral TID      Subjective:   [name] [name] was seen and examined today.  Concerned about home health nurse and family unable to assist with irrigation.  She is bedbound herself.  BP elevated.   Objective:   Vitals:   05/22/22 0500 05/22/22 0530 05/22/22 0540 05/22/22 0813  BP:  (!) 199/81  (!) 181/69  Pulse:  71    Resp:  18    Temp:  98 F (36.7 C) 98 F (36.7 C) 98.6 F (37 C)  TempSrc:  Oral Oral Oral  SpO2:  97%    Weight: 61.5 kg     Height:        Intake/Output Summary (Last 24 hours) at 05/22/2022 1225 Last data filed at 05/22/2022 0500 Gross per 24 hour  Intake 420 ml  Output 3150 ml  Net -2730 ml     Wt Readings from Last 3 Encounters:  05/22/22 61.5 kg  04/14/21 67.6 kg  01/06/21 61.2 kg   Physical Exam General: Alert and oriented x 3, NAD Cardiovascular: S1 S2 clear, RRR.  Respiratory: CTAB, no wheezing Gastrointestinal: Soft, nontender, nondistended, NBS Ext: no pedal edema bilaterally Neuro: decreased sensation bilaterally LE with flaccidity GU: foley+ Psych: Normal affect and demeanor    Data Reviewed:  I have personally reviewed following labs    CBC Lab Results  Component Value Date   WBC 8.2 05/20/2022   RBC 3.19 (L) 05/20/2022   HGB 8.8 (L) 05/20/2022   HCT 28.7 (L) 05/20/2022   MCV 90.0 05/20/2022   MCH 27.6 05/20/2022   PLT 173 05/20/2022   MCHC 30.7 05/20/2022    RDW 15.5 05/20/2022   LYMPHSABS 3.0 05/19/2022   MONOABS 1.0 05/19/2022   EOSABS 0.2 05/19/2022   BASOSABS 0.1 79/08/4095     Last metabolic panel Lab Results  Component Value Date   NA 137 05/20/2022   K 4.1 05/20/2022   CL 105 05/20/2022  CO2 23 05/20/2022   BUN 33 (H) 05/20/2022   CREATININE 4.14 (H) 05/20/2022   GLUCOSE 138 (H) 05/20/2022   GFRNONAA 12 (L) 05/20/2022   GFRAA 10 (L) 03/14/2020   CALCIUM 8.4 (L) 05/20/2022   PHOS 5.0 (H) 05/21/2022   PROT 6.4 (L) 05/20/2022   ALBUMIN 3.1 (L) 05/21/2022   BILITOT 0.6 05/20/2022   ALKPHOS 114 05/20/2022   AST 15 05/20/2022   ALT 12 05/20/2022   ANIONGAP 9 05/20/2022      Radiology Studies: I have personally reviewed the imaging studies  VAS [place] ABI WITH/WO TBI  Result Date: 05/22/2022  LOWER EXTREMITY DOPPLER STUDY Patient Name:  [name] [name]  Date of Exam:   05/22/2022 Medical Rec #: [id number]      Accession #:    [id number] Date of Birth: 17-Jul-1965      Patient Gender: F Patient Age:   56 years Exam [facility]  [facility] Procedure:      VAS [place] ABI WITH/WO TBI Referring Phys: Rosalio Catterton --------------------------------------------------------------------------------  Indications: Bilateral second toe discoloration. Per podiatry, "Suspect the              discoloration is hyperpigmentation secondary to large              hyperkeratosis with hammertoe deformity." High Risk Factors: Hypertension, Diabetes, prior CVA. Other Factors: ESRD on dialysis, wheelchair bound.  Comparison Study: No prior study Performing Technologist: Sharion Dove RVS  Examination Guidelines: A complete evaluation includes at minimum, Doppler waveform signals and systolic blood pressure reading at the level of bilateral brachial, anterior tibial, and posterior tibial arteries, when vessel segments are accessible. Bilateral testing is considered an integral part of a complete examination. Photoelectric Plethysmograph (PPG) waveforms and toe  systolic pressure readings are included as required and additional duplex testing as needed. Limited examinations for reoccurring indications may be performed as noted.  ABI Findings: +---------+------------------+-----+-----------+-----------+ Right    Rt Pressure (mmHg)IndexWaveform   Comment     +---------+------------------+-----+-----------+-----------+ Brachial                        triphasic  IV/bandages +---------+------------------+-----+-----------+-----------+ PTA      192               1.03 multiphasic            +---------+------------------+-----+-----------+-----------+ DP       170               0.91 multiphasic            +---------+------------------+-----+-----------+-----------+ Great [id number]               0.58                        +---------+------------------+-----+-----------+-----------+ +---------+------------------+-----+-----------+-------+ Left     Lt Pressure (mmHg)IndexWaveform   Comment +---------+------------------+-----+-----------+-------+ Brachial 187                    triphasic          +---------+------------------+-----+-----------+-------+ PTA      198               1.06 multiphasic        +---------+------------------+-----+-----------+-------+ DP       163               0.87 multiphasic        +---------+------------------+-----+-----------+-------+ Doristine Devoid [id number]  0.61                    +---------+------------------+-----+-----------+-------+ +-------+-----------+-----------+------------+------------+ ABI/TBIToday's ABIToday's TBIPrevious ABIPrevious TBI +-------+-----------+-----------+------------+------------+ Right  1.03       0.58                                +-------+-----------+-----------+------------+------------+ Left   1.06       0.61                                +-------+-----------+-----------+------------+------------+   Summary: Right: Resting right ankle-brachial  index is within normal range. The right toe-brachial index is abnormal. Left: Resting left ankle-brachial index is within normal range. The left toe-brachial index is abnormal. *See table(s) above for measurements and observations.     Preliminary        Estill Cotta M.D. [facility] 05/22/2022, 12:25 PM  Available via Epic secure chat 7am-7pm After 7 pm, please refer to night coverage provider listed on amion.

## 2022-05-22 NOTE — Discharge Summary (Signed)
Physician Discharge Summary   Patient: [name] [name] MRN: [id number] DOB: [birth year]  Admit date:     05/19/2022  Discharge date: 05/22/22  Discharge Physician: Estill Cotta, MD    PCP: Myrtis Hopping., MD   Recommendations at discharge:   Started on Keflex to 50 mg p.o. twice daily for 7 days Started Norvasc 10 mg daily  Discharge Diagnoses:    Hypertensive emergency   Dizziness    GERD (gastroesophageal reflux disease)   Diabetes mellitus with complication ([place])   ESRD (end stage renal disease) ([place])   Pyocystis   Hypokalemia   Hypertensive emergency   Hammertoes of both feet   Hyperpigmentation    Hospital Course:  56 year old female with history of ESRD on hemodialysis Monday Wednesday Friday through left chest wall permacath, type II controlled diabetes on diet control, history of stroke with residual weakness, currently wheelchair-bound, GERD, hypertension who does have significant history of Pyoscystitis that requires bladder irrigation at least 3 times a day.  She has been in and out of the hospital due to this.  She develops a lot of blood clot and mucus debris if not irrigated.  She usually have Foley catheter, seen by urology in follow-up, patient requested to remove Foley catheter so she was sent home to do a straight cath 3 times a day and flush her bladder that she cannot do it and her husband is also unable to do it.  Patient started developing suprapubic discomfort so came to the emergency room. In the emergency room she was noted to have blood pressure of 247/95, Cardene drip was initiated.    Assessment and Plan:  Hypertensive emergency -In ED, was noted to have BP of 247/95, was started on Cardene drip.  Patient had been unable to take p.o. meds due to abdominal pain and nausea. -BP now improving, was started on hydralazine 100 mg 3 times daily and Cardene was weaned off. -BP continues to be elevated, started on Norvasc 10 mg daily     Active Problems:   ESRD (end stage renal disease) ([place]) on HD, MWF -Last HD on Wednesday 11/15 -Nephrology following, underwent HD on 7/17   History of neurogenic bladder and pyocystis, complicated UTI -Patient receives urology care at [facility], reviewed records in care everywhere.  Foley was removed on 05/07/2022, recommended irrigation of bladder with catheter. However patient was unable to do CIC and irrigation, she was not able to procure home health nurse, then developed suprapubic discomfort.  In ED at [facility], Foley catheter was reinserted and 3 L of saline was irrigated leading to removal of clots and purulent material. -Patient has Foley catheter in place now  -Urine culture showed >100K colonies of Proteus Mirabilis, 80,000 colonies of Klebsiella started on Keflex.  Per patient, she has significant itching with Bactrim hence discontinued. -Patient has been declined from several home health services/agencies and unable to obtain daily [facility] RN for irrigation.  -Requested urology on-call, Dr Cain Sieve for recommendations. Appreciate recommendations, will discharge with foley and patient will follow-up with her urologist, Dr Marnee Guarneri, [facility] in 1 week.     GERD (gastroesophageal reflux disease) -Continue PPI    Diabetes mellitus with complication ([place]) -Not on any medications   Discoloration on the toes -ABIs normal, seen by podiatry, hyperpigmentation secondary to large hyperkeratosis with hammertoe deformity. -Recommended outpatient follow-up        Pain control - [facility] Controlled Substance Reporting System database was reviewed. and patient was instructed, not  to drive, operate heavy machinery, perform activities at heights, swimming or participation in water activities or provide baby-sitting services while on Pain, Sleep and Anxiety Medications; until their outpatient Physician has advised to do so again. Also recommended to not to take more than prescribed Pain, Sleep and  Anxiety Medications.  Consultants: urology , nephrology, podiatry Procedures performed: HD  Disposition: Home Diet recommendation:  Discharge Diet Orders (From admission, onward)     Start     Ordered   05/22/22 0000  Diet - low sodium heart healthy        05/22/22 1210           Carb modified diet DISCHARGE MEDICATION: Allergies as of 05/22/2022       Reactions   Amitriptyline Swelling   Other reaction(s): Confusion (intolerance)   Ciprofloxacin Diarrhea, Nausea And Vomiting   Severe vomiting requiring ED visit, IV reglan and IV fluids   Semaglutide Nausea Only   Tramadol Itching   Tylenol [acetaminophen] Itching        Medication List     STOP taking these medications    amoxicillin-clavulanate 500-125 MG tablet Commonly known as: AUGMENTIN   lidocaine 5 % Commonly known as: LIDODERM   SUMAtriptan 100 MG tablet Commonly known as: IMITREX       TAKE these medications    amLODipine 10 MG tablet Commonly known as: NORVASC Take 1 tablet (10 mg total) by mouth daily.   BISACODYL PO Take 5 mg by mouth daily as needed (constipation).   cephALEXin 250 MG capsule Commonly known as: KEFLEX Take 1 capsule (250 mg total) by mouth 2 (two) times daily for 7 days.   hydrALAZINE 100 MG tablet Commonly known as: APRESOLINE Take 1 tablet (100 mg total) by mouth 3 (three) times daily.   hydrOXYzine 25 MG tablet Commonly known as: ATARAX Take 1 tablet (25 mg total) by mouth every 8 (eight) hours as needed for itching.   ondansetron 4 MG tablet Commonly known as: Zofran Take 1 tablet (4 mg total) by mouth every 8 (eight) hours as needed for nausea or vomiting.   senna-docusate 8.6-50 MG tablet Commonly known as: Senokot-S Take 2 tablets by mouth 2 (two) times daily.   TUMS PO Take 1 Dose by mouth 3 (three) times daily with meals.        Follow-up [place], [facility] Follow up.   Why: For home health  services Contact information: [place] [address] [phone]         Myrtis Hopping., MD. Schedule an appointment as soon as possible for a visit in 2 week(s).   Specialty: Internal Medicine Why: for hospital follow-up Contact information: [address] [place] [address] [phone]         Criselda Peaches, DPM. Call on 05/24/2022.   Specialty: Podiatry Why: for appointment Contact information: [address] [address] [phone]         Bethann Goo, MD. Schedule an appointment as soon as possible for a visit in 1 week(s).   Specialty: Urology Why: for hospital follow-up Contact information: [place] [address] [phone]                Discharge Exam: Filed Weights   05/21/22 2000 05/22/22 0030 05/22/22 0500  Weight: 63.5 kg 61 kg 61.5 kg   S: Doing well, no acute complaints   Physical  Exam General: Alert and oriented x 3, NAD Cardiovascular: S1 S2 clear, RRR.  Respiratory: CTAB, no wheezing, rales or rhonchi Gastrointestinal: Soft, nontender, nondistended, NBS Ext: no pedal edema bilaterally Psych: Normal affect and demeanor, alert and oriented x3    Condition at discharge: fair  The results of significant diagnostics from this hospitalization (including imaging, microbiology, ancillary and laboratory) are listed below for reference.   Imaging Studies: VAS [place] ABI WITH/WO TBI  Result Date: 05/22/2022  LOWER EXTREMITY DOPPLER STUDY Patient Name:  [name] [name]  Date of Exam:   05/22/2022 Medical Rec #: [id number]      Accession #:    [id number] Date of Birth: Sep 13, 1965      Patient Gender: F Patient Age:   28 years Exam Location:  [facility] Procedure:      VAS [place] ABI WITH/WO TBI Referring Phys: Eldo Umanzor --------------------------------------------------------------------------------  Indications: Bilateral second toe discoloration. Per podiatry,  "Suspect the              discoloration is hyperpigmentation secondary to large              hyperkeratosis with hammertoe deformity." High Risk Factors: Hypertension, Diabetes, prior CVA. Other Factors: ESRD on dialysis, wheelchair bound.  Comparison Study: No prior study Performing Technologist: Sharion Dove RVS  Examination Guidelines: A complete evaluation includes at minimum, Doppler waveform signals and systolic blood pressure reading at the level of bilateral brachial, anterior tibial, and posterior tibial arteries, when vessel segments are accessible. Bilateral testing is considered an integral part of a complete examination. Photoelectric Plethysmograph (PPG) waveforms and toe systolic pressure readings are included as required and additional duplex testing as needed. Limited examinations for reoccurring indications may be performed as noted.  ABI Findings: +---------+------------------+-----+-----------+-----------+ Right    Rt Pressure (mmHg)IndexWaveform   Comment     +---------+------------------+-----+-----------+-----------+ Brachial                        triphasic  IV/bandages +---------+------------------+-----+-----------+-----------+ PTA      192               1.03 multiphasic            +---------+------------------+-----+-----------+-----------+ DP       170               0.91 multiphasic            +---------+------------------+-----+-----------+-----------+ Great Toe109               0.58                        +---------+------------------+-----+-----------+-----------+ +---------+------------------+-----+-----------+-------+ Left     Lt Pressure (mmHg)IndexWaveform   Comment +---------+------------------+-----+-----------+-------+ Brachial 187                    triphasic          +---------+------------------+-----+-----------+-------+ PTA      198               1.06 multiphasic         +---------+------------------+-----+-----------+-------+ DP       163               0.87 multiphasic        +---------+------------------+-----+-----------+-------+ Great Toe114               0.61                    +---------+------------------+-----+-----------+-------+ +-------+-----------+-----------+------------+------------+  ABI/TBIToday's ABIToday's TBIPrevious ABIPrevious TBI +-------+-----------+-----------+------------+------------+ Right  1.03       0.58                                +-------+-----------+-----------+------------+------------+ Left   1.06       0.61                                +-------+-----------+-----------+------------+------------+  Summary: Right: Resting right ankle-brachial index is within normal range. The right toe-brachial index is abnormal. Left: Resting left ankle-brachial index is within normal range. The left toe-brachial index is abnormal. *See table(s) above for measurements and observations.  Electronically signed by Deitra Mayo MD on 05/22/2022 at 12:45:50 PM.    Final     Microbiology: Results for orders placed or performed during the hospital encounter of 05/19/22  Urine Culture     Status: Abnormal   Collection Time: 05/19/22  5:00 PM   Specimen: Urine, Catheterized  Result Value Ref Range Status   Specimen Description   Final    URINE, CATHETERIZED Performed at [facility] [address], [place], [place] [address]    Special Requests   Final    NONE Performed at [facility], [place] [address], [place], [place] [address]    Culture (A)  Final    >=100,000 COLONIES/mL PROTEUS MIRABILIS 80,000 COLONIES/mL KLEBSIELLA PNEUMONIAE    Report Status 05/22/2022 FINAL  Final   Organism ID, Bacteria PROTEUS MIRABILIS (A)  Final   Organism ID, Bacteria KLEBSIELLA PNEUMONIAE (A)  Final      Susceptibility   Klebsiella pneumoniae - MIC*    AMPICILLIN >=32 RESISTANT Resistant      CEFAZOLIN <=4 SENSITIVE Sensitive     CEFEPIME <=0.12 SENSITIVE Sensitive     CEFTRIAXONE <=0.25 SENSITIVE Sensitive     CIPROFLOXACIN <=0.25 SENSITIVE Sensitive     GENTAMICIN <=1 SENSITIVE Sensitive     IMIPENEM <=0.25 SENSITIVE Sensitive     NITROFURANTOIN <=16 SENSITIVE Sensitive     TRIMETH/SULFA <=20 SENSITIVE Sensitive     AMPICILLIN/SULBACTAM 4 SENSITIVE Sensitive     PIP/TAZO <=4 SENSITIVE Sensitive     * 80,000 COLONIES/mL KLEBSIELLA PNEUMONIAE   Proteus mirabilis - MIC*    AMPICILLIN <=2 SENSITIVE Sensitive     CEFAZOLIN <=4 SENSITIVE Sensitive     CEFEPIME <=0.12 SENSITIVE Sensitive     CEFTRIAXONE <=0.25 SENSITIVE Sensitive     CIPROFLOXACIN <=0.25 SENSITIVE Sensitive     GENTAMICIN <=1 SENSITIVE Sensitive     IMIPENEM 1 SENSITIVE Sensitive     NITROFURANTOIN 128 RESISTANT Resistant     TRIMETH/SULFA <=20 SENSITIVE Sensitive     AMPICILLIN/SULBACTAM <=2 SENSITIVE Sensitive     PIP/TAZO <=4 SENSITIVE Sensitive     * >=100,000 COLONIES/mL PROTEUS MIRABILIS    Labs: CBC: Recent Labs  Lab 05/19/22 1729 05/20/22 1030  WBC 8.6 8.2  NEUTROABS 4.4  --   HGB 9.7* 8.8*  HCT 31.7* 28.7*  MCV 89.3 90.0  PLT 174 798   Basic Metabolic Panel: Recent Labs  Lab 05/19/22 1729 05/20/22 1030 05/21/22 1839  NA 137 137  --   K 3.3* 4.1  --   CL 102 105  --   CO2 25 23  --   GLUCOSE 132* 138*  --   BUN 30* 33*  --   CREATININE 3.48* 4.14*  --  CALCIUM 8.4* 8.4*  --   PHOS  --   --  5.0*   Liver Function Tests: Recent Labs  Lab 05/19/22 1729 05/20/22 1030 05/21/22 1839  AST 19 15  --   ALT 14 12  --   ALKPHOS 134* 114  --   BILITOT 0.8 0.6  --   PROT 7.5 6.4*  --   ALBUMIN 3.7 2.9* 3.1*   CBG: No results for input(s): "GLUCAP" in the last 168 hours.  Discharge time spent: greater than 30 minutes.  Signed: Estill Cotta, MD [facility] 05/22/2022

## 2022-05-22 NOTE — Progress Notes (Signed)
VASCULAR LAB    ABI has been performed.  See CV proc for preliminary results.   Katrell Milhorn, RVT 05/22/2022, 10:54 AM

## 2022-05-22 NOTE — Progress Notes (Signed)
   05/22/22 0030  Vitals  Temp 98.1 F (36.7 C)  Temp Source Oral  BP (!) 149/75  MAP (mmHg) 94  BP Location Left Arm  BP Method Automatic  Patient Position (if appropriate) Lying  Pulse Rate 98  Pulse Rate Source Monitor  ECG Heart Rate 98  Resp 16  Post Treatment  Duration of HD Treatment -hour(s) 3.5 hour(s)  Liters Processed 64.6  Fluid Removed (mL) 2700 mL  Tolerated HD Treatment Yes  Post-Hemodialysis Comments turn off UF the last 18 min.s due to low bp heady feeling of PT   TX fin. W/o difficulty.

## 2022-05-22 NOTE — Progress Notes (Signed)
Rai, MD requested that RN reach out to Vascular([phone]/ [phone]) for Ankle Brachial Indices (ABI) to be expedited for possible DC. Automated message states that vascular is not available on weekends. Rai, MD and Junie Panning, charge RN notified of situation.

## 2022-05-22 NOTE — TOC Initial Note (Addendum)
Transition of Care [facility]) - Initial/Assessment Note    Patient Details  Name: [name] [name] MRN: [id number] Date of Birth: Oct 30, 1965  [facility] of [facility]) CM/SW Contact:    Carles Collet, RN Phone Number: 05/22/2022, 9:48 AM  Clinical Narrative:                  Patient from home w spouse. Needs HH RN for weekly foley catheter irrigations. Accepted by [facility], confirmed w liaison. Barrier to DC is ABI, unable to schedule through Vascular (see RN note). I reached out to [facility], [facility] to see if she knew who to get a hold of over the weekend to see if it would be possible to get the patient on the schedule.   14:00 discussed case w MD. Patient cleared for DC, urology consulted and patient does NOT need HHRN or foley irrigations. Notified [facility]. PTAR forms on chart.   Expected Discharge Plan: [place] Barriers to Discharge: Continued Medical Work up (NEEDS ABI, unable to schedule over the weekend, reached out to [facility] for assistance)   Patient Goals and CMS Choice Patient states their goals for this hospitalization and ongoing recovery are:: to go home CMS Medicare.gov Compare Post Acute Care list provided to:: Patient Choice offered to / list presented to : Patient  Expected Discharge Plan and Services Expected Discharge Plan: [place]   Discharge Planning Services: CM Consult Post Acute Care Choice: [place] arrangements for the past 2 months: [place]: RN [place] Agency: [place] ([place]) Date [place]: 05/22/22 Time [place]: [phone] Representative spoke with at [place]: Corene Cornea  Prior Living Arrangements/Services Living arrangements for the past 2 months: [place] with:: Spouse                   Activities of Daily Living Home Assistive Devices/Equipment: [employer] fast streamer ADL Screening (condition at time of  admission) Patient's cognitive ability adequate to safely complete daily activities?: Yes Is the patient deaf or have difficulty hearing?: No Does the patient have difficulty seeing, even when wearing glasses/contacts?: No Does the patient have difficulty concentrating, remembering, or making decisions?: No Patient able to express need for assistance with ADLs?: Yes Does the patient have difficulty dressing or bathing?: Yes Independently performs ADLs?: No Communication: Independent Dressing (OT): Needs assistance Is this a change from baseline?: Pre-admission baseline Grooming: Needs assistance Is this a change from baseline?: Pre-admission baseline Feeding: Independent Bathing: Needs assistance Is this a change from baseline?: Pre-admission baseline Toileting: Needs assistance Is this a change from baseline?: Pre-admission baseline In/Out Bed: Needs assistance Is this a change from baseline?: Pre-admission baseline Walks in Home: Dependent Is this a change from baseline?: Pre-admission baseline Does the patient have difficulty walking or climbing stairs?: Yes Weakness of Legs: Both Weakness of Arms/Hands: Both  Permission Sought/Granted                  Emotional Assessment              Admission diagnosis:  Hypertensive emergency [I16.1] Hypertensive urgency, malignant [I16.0] Patient Active Problem List   Diagnosis Date Noted   Hammertoes of both feet 05/21/2022   Hyperpigmentation 05/21/2022   Hypertensive emergency 05/20/2022   Hypertensive urgency, malignant 05/19/2022  Pyocystis 05/19/2022   Hypokalemia 05/19/2022   Cellulitis 04/09/2021   Cellulitis of suprapubic region 04/09/2021   Leukocytosis 04/09/2021   DM2 (diabetes mellitus, type 2) ([place]) 04/09/2021   Osteoporosis 10/13/2020   Fever    Closed fracture of left proximal tibia 10/10/2020   Fracture of femoral neck, right ([place]) 10/02/2020   Pressure injury of skin 03/12/2020   Sepsis ([place])  03/07/2020   ESRD (end stage renal disease) ([place]) 03/07/2020   Hyperkalemia 03/07/2020   Acute cystitis 63/33/5456   Acute metabolic encephalopathy 25/63/8937   Impaired vision in both eyes 11/19/2015   Diabetes mellitus with complication (HCC)    Intractable headache    Syncope 07/28/2015   Chronic headaches 07/28/2015   Chest pain 05/22/2015   Urinary frequency 03/13/2015   Easy bruising 10/09/2014   Encounter for preventative adult health care exam with abnormal findings 01/11/2014   Chronic constipation 01/11/2014   GERD (gastroesophageal reflux disease) 34/28/7681   Helicobacter positive gastritis 12/06/2013   Dizziness and giddiness 10/12/2013   Essential hypertension 09/19/2013   Back pain 06/11/2013   Lumbosacral spondylosis without myelopathy 02/20/2013   Diabetic neuropathy ([place]) 01/09/2013   DM (diabetes mellitus), type 2, uncontrolled 10/02/2012   MITRAL REGURGITATION, mild 10/31/2009   PLEURAL EFFUSION, RIGHT after pyelonephritis in April 2011 (no intervention) 10/20/2009   PCP:  Myrtis Hopping., MD Pharmacy:   [facility] [phone] - [place], [place] - [address] AT [address] [place] [address] Phone: [phone] Fax: [phone]  [facility] [address] [place] [address] Phone: [phone] Fax: [phone]     Social Determinants of Health (SDOH) Interventions    Readmission Risk Interventions    06/03/2020    1:33 PM  Readmission Risk Prevention Plan  Transportation Screening Complete  PCP or Specialist Appt within 3-5 Days Complete  HRI or [place] Complete  Social Work Consult for [place] Planning/Counseling Complete  Palliative Care Screening Not Applicable  Medication Review Press photographer) Complete

## 2022-05-24 NOTE — Progress Notes (Signed)
11/20: CSW received call from patient stating she was supposed to receive a home health nurse for her catheter flushes but no one showed up. CSW let her know that per notes, it was determined that home health RN was not needed and that she should follow up with her urologist. Patient stated that she does need a HH RN and was told she would have one because her aide cannot do it.   CSW made prior discharging MD aware and she repeated that per urologist consult, patient does not need HH RN and irrigation and is to continue foley and follow up with her own urologist in [place] in 1 week. CSW relayed info to patient. She stated she did not have a urologist. CSW offered to provide her with the number for the urologist listed on her AVS but she declined stating she needed to speak with the hospital MD. CSW explained that since she is discharged she would need to follow up with her PCP for any additional orders. She requested MD's contact info so CSW provided the hospital's main number.   Gilmore Laroche, MSW, [facility]

## 2022-05-26 LAB — HEPATITIS B SURFACE ANTIBODY, QUANTITATIVE: Hep B S AB Quant (Post): <3.1 m[IU]/mL — ABNORMAL LOW (ref >9.9)

## 2022-08-09 ENCOUNTER — Encounter (HOSPITAL_COMMUNITY): Payer: Self-pay | Admitting: *Deleted

## 2022-08-09 ENCOUNTER — Emergency Department (HOSPITAL_COMMUNITY)
Admission: EM | Admit: 2022-08-09 | Discharge: 2022-08-11 | Disposition: A | Discharge status: Home / Self Care | Payer: Medicare HMO | Attending: Emergency Medicine | Admitting: Emergency Medicine

## 2022-08-09 ENCOUNTER — Emergency Department (HOSPITAL_COMMUNITY): Payer: Medicare HMO

## 2022-08-09 ENCOUNTER — Other Ambulatory Visit: Payer: Self-pay

## 2022-08-09 DIAGNOSIS — Z79899 Other long term (current) drug therapy: Secondary | ICD-10-CM | POA: Insufficient documentation

## 2022-08-09 DIAGNOSIS — R109 Unspecified abdominal pain: Secondary | ICD-10-CM

## 2022-08-09 DIAGNOSIS — K5900 Constipation, unspecified: Secondary | ICD-10-CM | POA: Insufficient documentation

## 2022-08-09 DIAGNOSIS — N186 End stage renal disease: Secondary | ICD-10-CM | POA: Insufficient documentation

## 2022-08-09 DIAGNOSIS — Z992 Dependence on renal dialysis: Secondary | ICD-10-CM | POA: Diagnosis not present

## 2022-08-09 DIAGNOSIS — I120 Hypertensive chronic kidney disease with stage 5 chronic kidney disease or end stage renal disease: Secondary | ICD-10-CM | POA: Diagnosis not present

## 2022-08-09 DIAGNOSIS — E1122 Type 2 diabetes mellitus with diabetic chronic kidney disease: Secondary | ICD-10-CM | POA: Diagnosis not present

## 2022-08-09 DIAGNOSIS — R1030 Lower abdominal pain, unspecified: Secondary | ICD-10-CM | POA: Diagnosis present

## 2022-08-09 LAB — COMPREHENSIVE METABOLIC PANEL
ALT: 10 U/L (ref 0–44)
AST: 16 U/L (ref 15–41)
Albumin: 3.2 g/dL — ABNORMAL LOW (ref 3.5–5.0)
Alkaline Phosphatase: 110 U/L (ref 38–126)
Anion gap: 13 (ref 5–15)
BUN: 22 mg/dL — ABNORMAL HIGH (ref 6–20)
CO2: 22 mmol/L (ref 22–32)
Calcium: 8.3 mg/dL — ABNORMAL LOW (ref 8.9–10.3)
Chloride: 100 mmol/L (ref 98–111)
Creatinine, Ser: 3.14 mg/dL — ABNORMAL HIGH (ref 0.44–1.00)
GFR, Estimated: 17 mL/min — ABNORMAL LOW (ref >60)
Glucose, Bld: 90 mg/dL (ref 70–99)
Potassium: 3.4 mmol/L — ABNORMAL LOW (ref 3.5–5.1)
Sodium: 135 mmol/L (ref 135–145)
Total Bilirubin: 0.4 mg/dL (ref 0.3–1.2)
Total Protein: 6.7 g/dL (ref 6.5–8.1)

## 2022-08-09 LAB — LIPASE, BLOOD: Lipase: 27 U/L (ref 11–51)

## 2022-08-09 LAB — CBC
HCT: 30.7 % — ABNORMAL LOW (ref 36.0–46.0)
Hemoglobin: 9 g/dL — ABNORMAL LOW (ref 12.0–15.0)
MCH: 20 pg — ABNORMAL LOW (ref 26.0–34.0)
MCHC: 29.3 g/dL — ABNORMAL LOW (ref 30.0–36.0)
MCV: 68.1 fL — ABNORMAL LOW (ref 80.0–100.0)
Platelets: 134 10*3/uL — ABNORMAL LOW (ref 150–400)
RBC: 4.51 MIL/uL (ref 3.87–5.11)
RDW: 19.1 % — ABNORMAL HIGH (ref 11.5–15.5)
WBC: 7.6 10*3/uL (ref 4.0–10.5)
nRBC: 0 % (ref 0.0–0.2)

## 2022-08-09 LAB — TROPONIN I (HIGH SENSITIVITY)
Troponin I (High Sensitivity): 55 ng/L — ABNORMAL HIGH (ref <18)
Troponin I (High Sensitivity): 49 ng/L — ABNORMAL HIGH (ref <18)

## 2022-08-09 LAB — HCG, QUANTITATIVE, PREGNANCY: hCG, Beta Chain, Quant, S: 10 m[IU]/mL — ABNORMAL HIGH (ref <5)

## 2022-08-09 LAB — I-STAT BETA HCG BLOOD, ED (MC, WL, AP ONLY): I-stat hCG, quantitative: 10.5 m[IU]/mL — ABNORMAL HIGH (ref <5)

## 2022-08-09 MED ORDER — CIPROFLOXACIN HCL 500 MG PO TABS: 500.0000 mg | ORAL_TABLET | Freq: Two times a day (BID) | ORAL | 0 refills | Status: DC

## 2022-08-09 MED ORDER — HYDROMORPHONE HCL 1 MG/ML IJ SOLN
0.5000 mg | Freq: Once | INTRAMUSCULAR | Status: AC
  Administered 2022-08-09: 0.5 mg via INTRAVENOUS
  Filled 2022-08-09: qty 1

## 2022-08-09 MED ORDER — ONDANSETRON HCL 4 MG/2ML IJ SOLN
4.0000 mg | Freq: Once | INTRAMUSCULAR | Status: AC
  Administered 2022-08-09: 4 mg via INTRAVENOUS
  Filled 2022-08-09: qty 2

## 2022-08-09 MED ORDER — ONDANSETRON 4 MG PO TBDP: 4.0000 mg | ORAL_TABLET | Freq: Three times a day (TID) | ORAL | 0 refills | Status: DC | PRN

## 2022-08-09 MED ORDER — METRONIDAZOLE 500 MG PO TABS: 500.0000 mg | ORAL_TABLET | Freq: Two times a day (BID) | ORAL | 0 refills | Status: DC

## 2022-08-09 MED ORDER — HYDRALAZINE HCL 20 MG/ML IJ SOLN
40.0000 mg | Freq: Once | INTRAMUSCULAR | Status: AC
  Administered 2022-08-09: 40 mg via INTRAVENOUS
  Filled 2022-08-09: qty 2

## 2022-08-09 NOTE — ED Notes (Signed)
PTAR called  

## 2022-08-09 NOTE — ED Triage Notes (Addendum)
Patient is a dialysis patient, she completed a full treatment today.  Patient remains hypertensive at 220/100.  She is also have lower abd pain with suprapubic cath.  She has hx of abscess and infections in the same area.  Patient also reports constipation.  Patient has had BM upon arrival.  She is alert and oriented.  CBG 98.  No po intake today

## 2022-08-09 NOTE — ED Notes (Signed)
Pt states she does not produce urine. Primary RN made aware.

## 2022-08-09 NOTE — ED Provider Notes (Incomplete)
  Physical Exam  BP (!) 167/71   Pulse 68   Temp 98.3 F (36.8 C) (Axillary)   Resp 14   SpO2 96%   Physical Exam  Procedures  Procedures  ED Course / MDM    Medical Decision Making Amount and/or Complexity of Data Reviewed Labs: ordered. Radiology: ordered.  Risk Prescription drug management.   ***

## 2022-08-09 NOTE — ED Provider Notes (Signed)
[facility] Provider Note   CSN: [id number] Arrival date & time: 08/09/22  1449     History {Add pertinent medical, surgical, social history, OB history to HPI:1} Chief Complaint  Patient presents with   Abdominal Pain        Hypertension    [name] [name] is a 57 y.o. female with past medical history significant for diabetes, diabetic neuropathy, hypertension, acid reflux, gastritis, ESRD on dialysis with previous history of abscess formation around urinary catheter who presents with concern for poorly controlled hypertension, lower abdominal pain.  Patient reports that she has been constipated for the last 2 to 3 days, reports small successful bowel movement, but reports she feels like she has a lot of pain and pressure in the lower abdomen.  She denies any nausea, vomiting, chest pain, shortness of breath.  She denies any hematochezia, hematemesis, rectal bleeding.  She reports that she only takes hydralazine for her blood pressure, although I do see Norvasc also documented in her chart.  She denies any urinary or suprapubic irritation today.  Nursing documented history of suprapubic catheter, but patient with normal urethral catheter in place on my exam.   Abdominal Pain Hypertension Associated symptoms include abdominal pain.       Home Medications Prior to Admission medications   Medication Sig Start Date End Date Taking? Authorizing Provider  amLODipine (NORVASC) 10 MG tablet Take 1 tablet (10 mg total) by mouth daily. 05/22/22   Rai, Vernelle Emerald, MD  BISACODYL PO Take 5 mg by mouth daily as needed (constipation).    [provider]  Calcium Carbonate Antacid (TUMS PO) Take 1 Dose by mouth 3 (three) times daily with meals.    [provider]  hydrALAZINE (APRESOLINE) 100 MG tablet Take 1 tablet (100 mg total) by mouth 3 (three) times daily. 05/22/22   Rai, Vernelle Emerald, MD  hydrOXYzine (ATARAX) 25 MG tablet Take 1  tablet (25 mg total) by mouth every 8 (eight) hours as needed for itching. 05/22/22   Rai, Ripudeep K, MD  ondansetron (ZOFRAN) 4 MG tablet Take 1 tablet (4 mg total) by mouth every 8 (eight) hours as needed for nausea or vomiting. 05/22/22   Rai, Ripudeep K, MD  senna-docusate (SENOKOT-S) 8.6-50 MG tablet Take 2 tablets by mouth 2 (two) times daily. Patient not taking: Reported on 04/08/2021 10/16/20   Alexandria Lodge, MD      Allergies    Amitriptyline, Ciprofloxacin, Semaglutide, Tramadol, and Tylenol [acetaminophen]    Review of Systems   Review of Systems  Gastrointestinal:  Positive for abdominal pain.  All other systems reviewed and are negative.   Physical Exam Updated Vital Signs BP (!) 227/100 (BP Location: Left Arm)   Pulse 77   Temp 98.1 F (36.7 C) (Oral)   Resp 19   SpO2 96%  Physical Exam  ED Results / Procedures / Treatments   Labs (all labs ordered are listed, but only abnormal results are displayed) Labs Reviewed  LIPASE, BLOOD  COMPREHENSIVE METABOLIC PANEL  CBC  URINALYSIS, ROUTINE W REFLEX MICROSCOPIC  I-STAT BETA HCG BLOOD, ED ([facility], [facility], [facility] ONLY)  TROPONIN I (HIGH SENSITIVITY)    EKG None  Radiology No results found.  Procedures Procedures  {Document cardiac monitor, telemetry assessment procedure when appropriate:1}  Medications Ordered in ED Medications  hydrALAZINE (APRESOLINE) injection 40 mg (has no administration in time range)  HYDROmorphone (DILAUDID) injection 0.5 mg (has no administration in time range)  ED Course/ Medical Decision Making/ A&P   {   Click here for ABCD2, HEART and other calculatorsREFRESH Note before signing :1}                          Medical Decision Making Amount and/or Complexity of Data Reviewed Labs: ordered. Radiology: ordered.  Risk Prescription drug management.   ***  {Document critical care time when appropriate:1} {Document review of labs and clinical decision tools ie heart score,  Chads2Vasc2 etc:1}  {Document your independent review of radiology images, and any outside records:1} {Document your discussion with family members, caretakers, and with consultants:1} {Document social determinants of health affecting pt's care:1} {Document your decision making why or why not admission, treatments were needed:1} Final Clinical Impression(s) / ED Diagnoses Final diagnoses:  None    Rx / DC Orders ED Discharge Orders     None

## 2022-08-10 LAB — BASIC METABOLIC PANEL
Anion gap: 14 (ref 5–15)
BUN: 34 mg/dL — ABNORMAL HIGH (ref 6–20)
CO2: 19 mmol/L — ABNORMAL LOW (ref 22–32)
Calcium: 8 mg/dL — ABNORMAL LOW (ref 8.9–10.3)
Chloride: 98 mmol/L (ref 98–111)
Creatinine, Ser: 4.38 mg/dL — ABNORMAL HIGH (ref 0.44–1.00)
GFR, Estimated: 11 mL/min — ABNORMAL LOW (ref >60)
Glucose, Bld: 290 mg/dL — ABNORMAL HIGH (ref 70–99)
Potassium: 5 mmol/L (ref 3.5–5.1)
Sodium: 131 mmol/L — ABNORMAL LOW (ref 135–145)

## 2022-08-10 MED ORDER — SODIUM ZIRCONIUM CYCLOSILICATE 10 G PO PACK
10.0000 g | PACK | Freq: Every day | ORAL | Status: DC
  Administered 2022-08-10: 10 g via ORAL
  Filled 2022-08-10: qty 1

## 2022-08-10 MED ORDER — DIPHENHYDRAMINE HCL 50 MG/ML IJ SOLN
12.5000 mg | Freq: Once | INTRAMUSCULAR | Status: AC
  Administered 2022-08-10: 12.5 mg via INTRAVENOUS
  Filled 2022-08-10: qty 1

## 2022-08-10 MED ORDER — LIDOCAINE HCL URETHRAL/MUCOSAL 2 % EX GEL: 1.0000 | Freq: Once | CUTANEOUS | Status: DC

## 2022-08-10 MED ORDER — AMLODIPINE BESYLATE 5 MG PO TABS
10.0000 mg | ORAL_TABLET | Freq: Every day | ORAL | Status: DC
  Filled 2022-08-10: qty 2

## 2022-08-10 MED ORDER — HYDRALAZINE HCL 100 MG PO TABS: 100.0000 mg | ORAL_TABLET | Freq: Three times a day (TID) | ORAL | Status: DC

## 2022-08-10 MED ORDER — HYDRALAZINE HCL 25 MG PO TABS
100.0000 mg | ORAL_TABLET | Freq: Three times a day (TID) | ORAL | Status: DC
  Administered 2022-08-10 (×3): 100 mg via ORAL
  Filled 2022-08-10 (×3): qty 4

## 2022-08-10 MED ORDER — METOCLOPRAMIDE HCL 5 MG/ML IJ SOLN
5.0000 mg | Freq: Once | INTRAMUSCULAR | Status: AC
  Administered 2022-08-10: 5 mg via INTRAVENOUS
  Filled 2022-08-10: qty 2

## 2022-08-10 MED ORDER — HYDRALAZINE HCL 25 MG PO TABS
100.0000 mg | ORAL_TABLET | Freq: Once | ORAL | Status: AC
  Administered 2022-08-10: 100 mg via ORAL
  Filled 2022-08-10: qty 4

## 2022-08-10 MED ORDER — CLONIDINE HCL 0.3 MG/24HR TD PTWK
0.3000 mg | MEDICATED_PATCH | Freq: Once | TRANSDERMAL | Status: DC
  Administered 2022-08-10: 0.3 mg via TRANSDERMAL
  Filled 2022-08-10: qty 1

## 2022-08-10 NOTE — ED Notes (Signed)
Allergic to tylenol

## 2022-08-10 NOTE — Consult Note (Signed)
I have been asked to see the patient by Dr. Godfrey Pick, for evaluation and management of difficult foley.  History of present illness: 40F with ESRD and pyocystitis presented to the ED with abdominal pain and constipation.   She was subsequently treated and as part of her treatment the foley catheter was removed in attempt to exchange it.  However, they were unsuccessful in placing the catheter several different times and urology was consulted to assist.  Foley catheter was placed at the last hospitalization due to UTI.  She is anuric but irrigates her catheter to minimize the infections.  Review of systems: A 12 point comprehensive review of systems was obtained and is negative unless otherwise stated in the history of present illness.  Patient Active Problem List   Diagnosis Date Noted   Hammertoes of both feet 05/21/2022   Hyperpigmentation 05/21/2022   Hypertensive emergency 05/20/2022   Hypertensive urgency, malignant 05/19/2022   Pyocystis 05/19/2022   Hypokalemia 05/19/2022   Cellulitis 04/09/2021   Cellulitis of suprapubic region 04/09/2021   Leukocytosis 04/09/2021   DM2 (diabetes mellitus, type 2) ([place]) 04/09/2021   Osteoporosis 10/13/2020   Fever    Closed fracture of left proximal tibia 10/10/2020   Fracture of femoral neck, right ([place]) 10/02/2020   Pressure injury of skin 03/12/2020   Sepsis ([place]) 03/07/2020   ESRD (end stage renal disease) ([place]) 03/07/2020   Hyperkalemia 03/07/2020   Acute cystitis 36/62/9476   Acute metabolic encephalopathy 54/65/0354   Impaired vision in both eyes 11/19/2015   Diabetes mellitus with complication (HCC)    Intractable headache    Syncope 07/28/2015   Chronic headaches 07/28/2015   Chest pain 05/22/2015   Urinary frequency 03/13/2015   Easy bruising 10/09/2014   Encounter for preventative adult health care exam with abnormal findings 01/11/2014   Chronic constipation 01/11/2014   GERD (gastroesophageal reflux disease) 65/68/1275    Helicobacter positive gastritis 12/06/2013   Dizziness and giddiness 10/12/2013   Essential hypertension 09/19/2013   Back pain 06/11/2013   Lumbosacral spondylosis without myelopathy 02/20/2013   Diabetic neuropathy ([place]) 01/09/2013   DM (diabetes mellitus), type 2, uncontrolled 10/02/2012   MITRAL REGURGITATION, mild 10/31/2009   PLEURAL EFFUSION, RIGHT after pyelonephritis in April 2011 (no intervention) 10/20/2009    No current facility-administered medications on file prior to encounter.   Current Outpatient Medications on File Prior to Encounter  Medication Sig Dispense Refill   amLODipine (NORVASC) 10 MG tablet Take 1 tablet (10 mg total) by mouth daily. 30 tablet 3   BISACODYL PO Take 5 mg by mouth daily as needed (constipation).     Calcium Carbonate Antacid (TUMS PO) Take 1 Dose by mouth 3 (three) times daily with meals.     hydrALAZINE (APRESOLINE) 100 MG tablet Take 1 tablet (100 mg total) by mouth 3 (three) times daily. 90 tablet 3   hydrOXYzine (ATARAX) 25 MG tablet Take 1 tablet (25 mg total) by mouth every 8 (eight) hours as needed for itching. 45 tablet 0   ondansetron (ZOFRAN) 4 MG tablet Take 1 tablet (4 mg total) by mouth every 8 (eight) hours as needed for nausea or vomiting. 30 tablet 0   senna-docusate (SENOKOT-S) 8.6-50 MG tablet Take 2 tablets by mouth 2 (two) times daily. (Patient not taking: Reported on 04/08/2021) 28 tablet 0    Past Medical History:  Diagnosis Date   Colitis    Diabetes mellitus    ESRD (end stage renal disease) ([place])    on HD (  M,W,F)   Family history of adverse reaction to anesthesia    " my uncle's heart stoped "   Fracture of femoral neck, right ([place]) 10/02/2020   GERD (gastroesophageal reflux disease)    Heart murmur    Hypertension    Impaired vision in both eyes 11/19/2015   Mitral regurgitation    Neuropathy in diabetes [facility])    Syncope and collapse 07/28/2015    Past Surgical History:  Procedure Laterality Date    ABDOMINAL HYSTERECTOMY  1999   for fibroids. she thinks one ovary was left   CATARACT EXTRACTION     CESAREAN SECTION     CHOLECYSTECTOMY     EYE SURGERY Left retinal   GANGLION CYST EXCISION     HIP PINNING,CANNULATED Right 10/02/2020   Procedure: CANNULATED HIP PINNING;  Surgeon: Altamese [place], MD;  Location: [facility];  Service: Orthopedics;  Laterality: Right;   INCISION AND DRAINAGE ABSCESS N/A 04/10/2021   Procedure: INCISION AND DRAINAGE ABSCESS LABIA;  Surgeon: Jovita Kussmaul, MD;  Location: [facility];  Service: General;  Laterality: N/A;   IR FLUORO GUIDE CV LINE LEFT  10/13/2020   IR PTA VENOUS EXCEPT DIALYSIS CIRCUIT  10/13/2020   ORIF TIBIA PLATEAU Left 10/09/2020   Procedure: OPEN REDUCTION INTERNAL FIXATION (ORIF) TIBIAL PLATEAU;  Surgeon: Altamese [place], MD;  Location: [facility];  Service: Orthopedics;  Laterality: Left;   TONSILLECTOMY     TUBAL LIGATION      Social History   Tobacco Use   Smoking status: Never   Smokeless tobacco: Never  Substance Use Topics   Alcohol use: No    Alcohol/week: 0.0 standard drinks of alcohol   Drug use: No    Family History  Problem Relation Age of Onset   Diabetes Father    Heart attack Father    Aneurysm Mother     PE: Vitals:   08/10/22 1800 08/10/22 1830 08/10/22 1900 08/10/22 2000  BP: (!) 219/85 (!) 191/85 (!) 169/94 (!) 184/74  Pulse: 74 76 77 77  Resp: 17 (!) 22 16 14   Temp:      TempSrc:      SpO2: 99% 100% 99% 99%  Height:       Patient appears to be in no acute distress  patient is alert and oriented x3 Atraumatic normocephalic head No cervical or supraclavicular lymphadenopathy appreciated No increased work of breathing, no audible wheezes/rhonchi Regular sinus rhythm/rate Abdomen is soft, nontender, nondistended, no CVA or suprapubic tenderness Lower extremities are symmetric without appreciable edema Grossly neurologically intact No identifiable skin lesions  Recent Labs    08/09/22 1720  WBC 7.6  HGB  9.0*  HCT 30.7*   Recent Labs    08/09/22 1720  NA 135  K 3.4*  CL 100  CO2 22  GLUCOSE 90  BUN 22*  CREATININE 3.14*  CALCIUM 8.3*   No results for input(s): "LABPT", "INR" in the last 72 hours. No results for input(s): "LABURIN" in the last 72 hours. Results for orders placed or performed during the hospital encounter of 05/19/22  Urine Culture     Status: Abnormal   Collection Time: 05/19/22  5:00 PM   Specimen: Urine, Catheterized  Result Value Ref Range Status   Specimen Description   Final    URINE, CATHETERIZED Performed at [facility] [address], [place], [place] [address]    Special Requests   Final    NONE Performed at [facility], [place]  [address], [place], [place] [address]    Culture (A)  Final    >=100,000 COLONIES/mL PROTEUS MIRABILIS 80,000 COLONIES/mL KLEBSIELLA PNEUMONIAE    Report Status 05/22/2022 FINAL  Final   Organism ID, Bacteria PROTEUS MIRABILIS (A)  Final   Organism ID, Bacteria KLEBSIELLA PNEUMONIAE (A)  Final      Susceptibility   Klebsiella pneumoniae - MIC*    AMPICILLIN >=32 RESISTANT Resistant     CEFAZOLIN <=4 SENSITIVE Sensitive     CEFEPIME <=0.12 SENSITIVE Sensitive     CEFTRIAXONE <=0.25 SENSITIVE Sensitive     CIPROFLOXACIN <=0.25 SENSITIVE Sensitive     GENTAMICIN <=1 SENSITIVE Sensitive     IMIPENEM <=0.25 SENSITIVE Sensitive     NITROFURANTOIN <=16 SENSITIVE Sensitive     TRIMETH/SULFA <=20 SENSITIVE Sensitive     AMPICILLIN/SULBACTAM 4 SENSITIVE Sensitive     PIP/TAZO <=4 SENSITIVE Sensitive     * 80,000 COLONIES/mL KLEBSIELLA PNEUMONIAE   Proteus mirabilis - MIC*    AMPICILLIN <=2 SENSITIVE Sensitive     CEFAZOLIN <=4 SENSITIVE Sensitive     CEFEPIME <=0.12 SENSITIVE Sensitive     CEFTRIAXONE <=0.25 SENSITIVE Sensitive     CIPROFLOXACIN <=0.25 SENSITIVE Sensitive     GENTAMICIN <=1 SENSITIVE Sensitive     IMIPENEM 1 SENSITIVE Sensitive     NITROFURANTOIN 128 RESISTANT  Resistant     TRIMETH/SULFA <=20 SENSITIVE Sensitive     AMPICILLIN/SULBACTAM <=2 SENSITIVE Sensitive     PIP/TAZO <=4 SENSITIVE Sensitive     * >=100,000 COLONIES/mL PROTEUS MIRABILIS    Imaging: none  Imp: Foley catheter placed.  Recommendations:  f/u with [facility]  Ardis Hughs

## 2022-08-10 NOTE — ED Provider Notes (Signed)
Care of patient assumed from Dr. Ayesha Rumpf.  This patient presented to the ED yesterday for lower abdominal pain.  She has had recent constipation.  CT scan showed rectum moderately distended with stool with some rectal wall thickening and mild adjacent inflammation.  She has a small right pleural effusion.  There were no other acute findings.   Per chart review, she is followed by [facility] urology.  She has history of pyelocystitis and is anuric.  In November, she had a chronic Foley removed and was advised to continue In-N-Out catheters with bladder irrigation.  She was reportedly unable to do this and a chronic Foley catheter was replaced during her admission later that for hypertensive crisis.  Foley catheter was removed last night in the ED and nursing staff was unable to replace.  She will need Foley catheter replacement. Physical Exam  BP (!) 146/64   Pulse 68   Temp 98.1 F (36.7 C) (Oral)   Resp 14   SpO2 96%   Physical Exam Vitals and nursing note reviewed.  Constitutional:      General: She is not in acute distress.    Appearance: She is well-developed. She is not ill-appearing, toxic-appearing or diaphoretic.  HENT:     Head: Normocephalic and atraumatic.  Eyes:     Extraocular Movements: Extraocular movements intact.     Conjunctiva/sclera: Conjunctivae normal.  Cardiovascular:     Rate and Rhythm: Normal rate and regular rhythm.  Pulmonary:     Effort: Pulmonary effort is normal. No respiratory distress.  Abdominal:     General: There is no distension.  Musculoskeletal:        General: No swelling.     Cervical back: Neck supple.  Skin:    General: Skin is warm and dry.     Coloration: Skin is not cyanotic or jaundiced.  Neurological:     General: No focal deficit present.     Mental Status: She is alert and oriented to person, place, and time.  Psychiatric:        Mood and Affect: Mood normal.        Behavior: Behavior normal.     Procedures  Procedures  ED  Course / MDM    Medical Decision Making Amount and/or Complexity of Data Reviewed Labs: ordered. Radiology: ordered.  Risk Prescription drug management.   On assessment, patient is resting on ED stretcher.  She endorses current headache.  She has an allergy to Tylenol.  Reglan and Benadryl were ordered.  Patient has not eaten in over 24 hours.  She was provided with food.  I spoke with the ED provider from yesterday he stated that she did have a bowel movement yesterday after her CT scan.  She is agreeable to DRE and enema to further optimize her stool burden.  Plan will be to reattempt Foley catheter placement.  Home blood pressure medications were ordered.  Patient declined the amlodipine and states that she no longer takes this.  She does use clonidine patches and removed her patch yesterday.  New clonidine patch was ordered.  Patient underwent enema with minimal stool output.  Attempted Foley catheter placement unsuccessfully.  She does have a lot of labial swelling.  Urology was consulted.  Urologist, Dr. Louis Meckel, will have nursing try again.  Care of patient was signed out to oncoming ED provider.       Godfrey Pick, MD 08/10/22 [phone]

## 2022-08-10 NOTE — ED Notes (Signed)
PTAR called  

## 2022-08-10 NOTE — Procedures (Signed)
Pre-procedure diagnosis: difficult foley catheterization Post-procedure diagnosis: as above  Procedure performed: placement of complicated foley  Surgeon: Dr. Ardis Hughs  Findings: orthotopic meatus  Specimen: none  Drains: 47F  foley  Indications:  Patient unable to void on his own.  Nursing staff have been unsuccessful at placing catheter.  Procedure:  Gentials were prepped and draped in the routine sterile fashion.  10cc of 1% viscous lidocaine jelly was then injected into the patient's urethra.  A 47F  catheter was then gently passed in to the urethral.  Patient tolerated the procedure well - no immediate issues.  Disposition: f/u with [facility]

## 2022-08-10 NOTE — ED Provider Notes (Signed)
Care assumed from Dr. Matilde Sprang.  Patient awaiting [place] her transport.  Her Foley catheter was discontinued and staff has been unable to replace.  She has this catheter in place for pyelocystitis.  She is an uric at baseline due to ESRD.  Will provide Xylocaine for comfort and reattempt with a smaller catheter.  Patient care transferred pending catheter insertion.   Quintella Reichert, MD 08/10/22 [phone]

## 2023-11-21 ENCOUNTER — Emergency Department (HOSPITAL_COMMUNITY)

## 2023-11-21 ENCOUNTER — Inpatient Hospital Stay (HOSPITAL_COMMUNITY)
Admission: EM | Admit: 2023-11-21 | Discharge: 2023-12-06 | DRG: 239 | Disposition: A | Discharge status: Skilled Nursing Facility | Attending: Internal Medicine | Admitting: Internal Medicine

## 2023-11-21 ENCOUNTER — Encounter (HOSPITAL_COMMUNITY): Payer: Self-pay | Admitting: Emergency Medicine

## 2023-11-21 ENCOUNTER — Other Ambulatory Visit: Payer: Self-pay

## 2023-11-21 DIAGNOSIS — I959 Hypotension, unspecified: Secondary | ICD-10-CM | POA: Diagnosis not present

## 2023-11-21 DIAGNOSIS — E785 Hyperlipidemia, unspecified: Secondary | ICD-10-CM | POA: Diagnosis present

## 2023-11-21 DIAGNOSIS — E8809 Other disorders of plasma-protein metabolism, not elsewhere classified: Secondary | ICD-10-CM | POA: Diagnosis present

## 2023-11-21 DIAGNOSIS — K219 Gastro-esophageal reflux disease without esophagitis: Secondary | ICD-10-CM | POA: Diagnosis not present

## 2023-11-21 DIAGNOSIS — E1122 Type 2 diabetes mellitus with diabetic chronic kidney disease: Secondary | ICD-10-CM | POA: Diagnosis present

## 2023-11-21 DIAGNOSIS — N186 End stage renal disease: Secondary | ICD-10-CM | POA: Diagnosis not present

## 2023-11-21 DIAGNOSIS — E1152 Type 2 diabetes mellitus with diabetic peripheral angiopathy with gangrene: Secondary | ICD-10-CM | POA: Diagnosis present

## 2023-11-21 DIAGNOSIS — G8311 Monoplegia of lower limb affecting right dominant side: Secondary | ICD-10-CM | POA: Diagnosis not present

## 2023-11-21 DIAGNOSIS — Z6830 Body mass index (BMI) 30.0-30.9, adult: Secondary | ICD-10-CM | POA: Diagnosis not present

## 2023-11-21 DIAGNOSIS — E11319 Type 2 diabetes mellitus with unspecified diabetic retinopathy without macular edema: Secondary | ICD-10-CM | POA: Diagnosis present

## 2023-11-21 DIAGNOSIS — Z7401 Bed confinement status: Secondary | ICD-10-CM | POA: Diagnosis not present

## 2023-11-21 DIAGNOSIS — L89153 Pressure ulcer of sacral region, stage 3: Secondary | ICD-10-CM | POA: Diagnosis not present

## 2023-11-21 DIAGNOSIS — L97429 Non-pressure chronic ulcer of left heel and midfoot with unspecified severity: Secondary | ICD-10-CM | POA: Diagnosis present

## 2023-11-21 DIAGNOSIS — E1140 Type 2 diabetes mellitus with diabetic neuropathy, unspecified: Secondary | ICD-10-CM | POA: Diagnosis present

## 2023-11-21 DIAGNOSIS — A419 Sepsis, unspecified organism: Secondary | ICD-10-CM | POA: Diagnosis not present

## 2023-11-21 DIAGNOSIS — I120 Hypertensive chronic kidney disease with stage 5 chronic kidney disease or end stage renal disease: Secondary | ICD-10-CM | POA: Diagnosis present

## 2023-11-21 DIAGNOSIS — I6523 Occlusion and stenosis of bilateral carotid arteries: Secondary | ICD-10-CM | POA: Diagnosis not present

## 2023-11-21 DIAGNOSIS — M869 Osteomyelitis, unspecified: Secondary | ICD-10-CM | POA: Diagnosis present

## 2023-11-21 DIAGNOSIS — Z515 Encounter for palliative care: Secondary | ICD-10-CM | POA: Diagnosis not present

## 2023-11-21 DIAGNOSIS — Z888 Allergy status to other drugs, medicaments and biological substances status: Secondary | ICD-10-CM

## 2023-11-21 DIAGNOSIS — E11649 Type 2 diabetes mellitus with hypoglycemia without coma: Secondary | ICD-10-CM | POA: Diagnosis not present

## 2023-11-21 DIAGNOSIS — Z794 Long term (current) use of insulin: Secondary | ICD-10-CM | POA: Diagnosis not present

## 2023-11-21 DIAGNOSIS — E11621 Type 2 diabetes mellitus with foot ulcer: Secondary | ICD-10-CM | POA: Diagnosis present

## 2023-11-21 DIAGNOSIS — M5416 Radiculopathy, lumbar region: Secondary | ICD-10-CM | POA: Diagnosis present

## 2023-11-21 DIAGNOSIS — H543 Unqualified visual loss, both eyes: Secondary | ICD-10-CM | POA: Diagnosis present

## 2023-11-21 DIAGNOSIS — E871 Hypo-osmolality and hyponatremia: Secondary | ICD-10-CM | POA: Diagnosis present

## 2023-11-21 DIAGNOSIS — I6389 Other cerebral infarction: Secondary | ICD-10-CM | POA: Diagnosis not present

## 2023-11-21 DIAGNOSIS — Z885 Allergy status to narcotic agent status: Secondary | ICD-10-CM

## 2023-11-21 DIAGNOSIS — Z89512 Acquired absence of left leg below knee: Secondary | ICD-10-CM | POA: Diagnosis not present

## 2023-11-21 DIAGNOSIS — Z7189 Other specified counseling: Secondary | ICD-10-CM | POA: Diagnosis not present

## 2023-11-21 DIAGNOSIS — Z9049 Acquired absence of other specified parts of digestive tract: Secondary | ICD-10-CM

## 2023-11-21 DIAGNOSIS — I953 Hypotension of hemodialysis: Secondary | ICD-10-CM | POA: Diagnosis not present

## 2023-11-21 DIAGNOSIS — I97821 Postprocedural cerebrovascular infarction during other surgery: Secondary | ICD-10-CM | POA: Diagnosis not present

## 2023-11-21 DIAGNOSIS — G9349 Other encephalopathy: Secondary | ICD-10-CM | POA: Diagnosis not present

## 2023-11-21 DIAGNOSIS — Z833 Family history of diabetes mellitus: Secondary | ICD-10-CM

## 2023-11-21 DIAGNOSIS — R1310 Dysphagia, unspecified: Secondary | ICD-10-CM | POA: Diagnosis not present

## 2023-11-21 DIAGNOSIS — Z886 Allergy status to analgesic agent status: Secondary | ICD-10-CM

## 2023-11-21 DIAGNOSIS — D72829 Elevated white blood cell count, unspecified: Secondary | ICD-10-CM | POA: Diagnosis not present

## 2023-11-21 DIAGNOSIS — D509 Iron deficiency anemia, unspecified: Secondary | ICD-10-CM | POA: Diagnosis present

## 2023-11-21 DIAGNOSIS — D631 Anemia in chronic kidney disease: Secondary | ICD-10-CM | POA: Diagnosis present

## 2023-11-21 DIAGNOSIS — N189 Chronic kidney disease, unspecified: Secondary | ICD-10-CM | POA: Diagnosis present

## 2023-11-21 DIAGNOSIS — Z881 Allergy status to other antibiotic agents status: Secondary | ICD-10-CM

## 2023-11-21 DIAGNOSIS — L03116 Cellulitis of left lower limb: Secondary | ICD-10-CM | POA: Diagnosis present

## 2023-11-21 DIAGNOSIS — Y835 Amputation of limb(s) as the cause of abnormal reaction of the patient, or of later complication, without mention of misadventure at the time of the procedure: Secondary | ICD-10-CM | POA: Diagnosis not present

## 2023-11-21 DIAGNOSIS — Z7901 Long term (current) use of anticoagulants: Secondary | ICD-10-CM | POA: Diagnosis not present

## 2023-11-21 DIAGNOSIS — I70261 Atherosclerosis of native arteries of extremities with gangrene, right leg: Secondary | ICD-10-CM | POA: Diagnosis present

## 2023-11-21 DIAGNOSIS — R41841 Cognitive communication deficit: Secondary | ICD-10-CM | POA: Diagnosis not present

## 2023-11-21 DIAGNOSIS — E1169 Type 2 diabetes mellitus with other specified complication: Secondary | ICD-10-CM | POA: Diagnosis present

## 2023-11-21 DIAGNOSIS — Z90710 Acquired absence of both cervix and uterus: Secondary | ICD-10-CM

## 2023-11-21 DIAGNOSIS — E119 Type 2 diabetes mellitus without complications: Secondary | ICD-10-CM

## 2023-11-21 DIAGNOSIS — Z8249 Family history of ischemic heart disease and other diseases of the circulatory system: Secondary | ICD-10-CM

## 2023-11-21 DIAGNOSIS — D75839 Thrombocytosis, unspecified: Secondary | ICD-10-CM | POA: Diagnosis present

## 2023-11-21 DIAGNOSIS — I639 Cerebral infarction, unspecified: Secondary | ICD-10-CM

## 2023-11-21 DIAGNOSIS — R001 Bradycardia, unspecified: Secondary | ICD-10-CM | POA: Diagnosis not present

## 2023-11-21 DIAGNOSIS — I10 Essential (primary) hypertension: Secondary | ICD-10-CM | POA: Diagnosis not present

## 2023-11-21 DIAGNOSIS — R29704 NIHSS score 4: Secondary | ICD-10-CM | POA: Diagnosis not present

## 2023-11-21 DIAGNOSIS — E1149 Type 2 diabetes mellitus with other diabetic neurological complication: Secondary | ICD-10-CM | POA: Diagnosis not present

## 2023-11-21 DIAGNOSIS — E11628 Type 2 diabetes mellitus with other skin complications: Secondary | ICD-10-CM | POA: Diagnosis present

## 2023-11-21 DIAGNOSIS — Z992 Dependence on renal dialysis: Secondary | ICD-10-CM | POA: Diagnosis not present

## 2023-11-21 DIAGNOSIS — E663 Overweight: Secondary | ICD-10-CM | POA: Diagnosis present

## 2023-11-21 DIAGNOSIS — Z79899 Other long term (current) drug therapy: Secondary | ICD-10-CM

## 2023-11-21 DIAGNOSIS — I63411 Cerebral infarction due to embolism of right middle cerebral artery: Secondary | ICD-10-CM | POA: Diagnosis not present

## 2023-11-21 DIAGNOSIS — D849 Immunodeficiency, unspecified: Secondary | ICD-10-CM | POA: Diagnosis present

## 2023-11-21 LAB — COMPREHENSIVE METABOLIC PANEL WITH GFR
ALT: 7 U/L (ref 0–44)
AST: 14 U/L — ABNORMAL LOW (ref 15–41)
Albumin: 2.5 g/dL — ABNORMAL LOW (ref 3.5–5.0)
Alkaline Phosphatase: 116 U/L (ref 38–126)
Anion gap: 10 (ref 5–15)
BUN: 22 mg/dL — ABNORMAL HIGH (ref 6–20)
CO2: 27 mmol/L (ref 22–32)
Calcium: 7.8 mg/dL — ABNORMAL LOW (ref 8.9–10.3)
Chloride: 96 mmol/L — ABNORMAL LOW (ref 98–111)
Creatinine, Ser: 3.85 mg/dL — ABNORMAL HIGH (ref 0.44–1.00)
GFR, Estimated: 13 mL/min — ABNORMAL LOW (ref >60)
Glucose, Bld: 260 mg/dL — ABNORMAL HIGH (ref 70–99)
Potassium: 3.6 mmol/L (ref 3.5–5.1)
Sodium: 133 mmol/L — ABNORMAL LOW (ref 135–145)
Total Bilirubin: 0.6 mg/dL (ref 0.0–1.2)
Total Protein: 6.6 g/dL (ref 6.5–8.1)

## 2023-11-21 LAB — CBC WITH DIFFERENTIAL/PLATELET
Abs Immature Granulocytes: 0.14 10*3/uL — ABNORMAL HIGH (ref 0.00–0.07)
Basophils Absolute: 0.1 10*3/uL (ref 0.0–0.1)
Basophils Relative: 0 %
Eosinophils Absolute: 0.2 10*3/uL (ref 0.0–0.5)
Eosinophils Relative: 1 %
HCT: 20.9 % — ABNORMAL LOW (ref 36.0–46.0)
Hemoglobin: 6.1 g/dL — CL (ref 12.0–15.0)
Immature Granulocytes: 1 %
Lymphocytes Relative: 16 %
Lymphs Abs: 2.9 10*3/uL (ref 0.7–4.0)
MCH: 18.5 pg — ABNORMAL LOW (ref 26.0–34.0)
MCHC: 29.2 g/dL — ABNORMAL LOW (ref 30.0–36.0)
MCV: 63.3 fL — ABNORMAL LOW (ref 80.0–100.0)
Monocytes Absolute: 1.2 10*3/uL — ABNORMAL HIGH (ref 0.1–1.0)
Monocytes Relative: 6 %
Neutro Abs: 14.3 10*3/uL — ABNORMAL HIGH (ref 1.7–7.7)
Neutrophils Relative %: 76 %
Platelets: 422 10*3/uL — ABNORMAL HIGH (ref 150–400)
RBC: 3.3 MIL/uL — ABNORMAL LOW (ref 3.87–5.11)
RDW: 22.4 % — ABNORMAL HIGH (ref 11.5–15.5)
WBC: 18.8 10*3/uL — ABNORMAL HIGH (ref 4.0–10.5)
nRBC: 0 % (ref 0.0–0.2)

## 2023-11-21 LAB — PREPARE RBC (CROSSMATCH)

## 2023-11-21 LAB — I-STAT CG4 LACTIC ACID, ED: Lactic Acid, Venous: 0.9 mmol/L (ref 0.5–1.9)

## 2023-11-21 MED ORDER — CALCIUM CARBONATE ANTACID 500 MG PO CHEW
1.0000 | CHEWABLE_TABLET | Freq: Three times a day (TID) | ORAL | Status: DC
  Administered 2023-11-22 – 2023-12-06 (×24): 200 mg via ORAL
  Filled 2023-11-21 (×30): qty 1

## 2023-11-21 MED ORDER — PIPERACILLIN-TAZOBACTAM IN DEX 2-0.25 GM/50ML IV SOLN
2.2500 g | Freq: Three times a day (TID) | INTRAVENOUS | Status: DC
  Filled 2023-11-21: qty 50

## 2023-11-21 MED ORDER — SORBITOL 70 % SOLN
30.0000 mL | Status: DC | PRN
  Administered 2023-11-29: 30 mL via ORAL
  Filled 2023-11-21 (×2): qty 30

## 2023-11-21 MED ORDER — ONDANSETRON HCL 4 MG/2ML IJ SOLN
4.0000 mg | Freq: Four times a day (QID) | INTRAMUSCULAR | Status: DC | PRN
Start: 2023-11-21 — End: 2023-12-07
  Administered 2023-11-25 – 2023-12-04 (×6): 4 mg via INTRAVENOUS
  Filled 2023-11-21 (×7): qty 2

## 2023-11-21 MED ORDER — VANCOMYCIN VARIABLE DOSE PER UNSTABLE RENAL FUNCTION (PHARMACIST DOSING): Status: DC

## 2023-11-21 MED ORDER — SODIUM CHLORIDE 0.9 % IV SOLN
1.0000 g | INTRAVENOUS | Status: DC
  Administered 2023-11-22 – 2023-11-27 (×5): 1 g via INTRAVENOUS
  Filled 2023-11-21 (×8): qty 10

## 2023-11-21 MED ORDER — ONDANSETRON HCL 4 MG PO TABS
4.0000 mg | ORAL_TABLET | Freq: Four times a day (QID) | ORAL | Status: DC | PRN
  Administered 2023-11-30: 4 mg via ORAL
  Filled 2023-11-21: qty 1

## 2023-11-21 MED ORDER — INSULIN ASPART 100 UNIT/ML IJ SOLN
0.0000 [IU] | Freq: Every day | INTRAMUSCULAR | Status: DC
Start: 2023-11-21 — End: 2023-12-07

## 2023-11-21 MED ORDER — HYDRALAZINE HCL 50 MG PO TABS
100.0000 mg | ORAL_TABLET | Freq: Three times a day (TID) | ORAL | Status: DC
  Administered 2023-11-22 – 2023-11-25 (×8): 100 mg via ORAL
  Filled 2023-11-21 (×12): qty 2

## 2023-11-21 MED ORDER — KETOROLAC TROMETHAMINE 15 MG/ML IJ SOLN
15.0000 mg | Freq: Once | INTRAMUSCULAR | Status: AC
  Administered 2023-11-21: 15 mg via INTRAVENOUS
  Filled 2023-11-21: qty 1

## 2023-11-21 MED ORDER — HYDROMORPHONE HCL 1 MG/ML IJ SOLN
0.5000 mg | Freq: Once | INTRAMUSCULAR | Status: AC | PRN
  Administered 2023-11-22: 0.5 mg via INTRAVENOUS
  Filled 2023-11-21: qty 0.5

## 2023-11-21 MED ORDER — NEPRO/CARBSTEADY PO LIQD: 237.0000 mL | Freq: Three times a day (TID) | ORAL | Status: DC | PRN

## 2023-11-21 MED ORDER — ACETAMINOPHEN 325 MG PO TABS
650.0000 mg | ORAL_TABLET | Freq: Four times a day (QID) | ORAL | Status: DC | PRN
  Filled 2023-11-21 (×3): qty 2

## 2023-11-21 MED ORDER — VANCOMYCIN HCL 1.25 G IV SOLR
1250.0000 mg | Freq: Once | INTRAVENOUS | Status: AC
  Administered 2023-11-22: 1250 mg via INTRAVENOUS
  Filled 2023-11-21: qty 25

## 2023-11-21 MED ORDER — LINEZOLID 600 MG/300ML IV SOLN
600.0000 mg | Freq: Once | INTRAVENOUS | Status: AC
  Administered 2023-11-21: 600 mg via INTRAVENOUS
  Filled 2023-11-21: qty 300

## 2023-11-21 MED ORDER — DOCUSATE SODIUM 283 MG RE ENEM: 1.0000 | ENEMA | RECTAL | Status: DC | PRN

## 2023-11-21 MED ORDER — INSULIN ASPART 100 UNIT/ML IJ SOLN
0.0000 [IU] | Freq: Three times a day (TID) | INTRAMUSCULAR | Status: DC
  Administered 2023-12-02 – 2023-12-06 (×3): 1 [IU] via SUBCUTANEOUS

## 2023-11-21 MED ORDER — AMLODIPINE BESYLATE 10 MG PO TABS
10.0000 mg | ORAL_TABLET | Freq: Every day | ORAL | Status: DC
  Administered 2023-11-22 – 2023-11-23 (×2): 10 mg via ORAL
  Filled 2023-11-21: qty 2
  Filled 2023-11-21: qty 1

## 2023-11-21 MED ORDER — SODIUM CHLORIDE 0.9% IV SOLUTION: Freq: Once | INTRAVENOUS | Status: DC

## 2023-11-21 MED ORDER — SODIUM CHLORIDE 0.9% IV SOLUTION: Freq: Once | INTRAVENOUS | Status: AC

## 2023-11-21 MED ORDER — HYDROXYZINE HCL 25 MG PO TABS
25.0000 mg | ORAL_TABLET | Freq: Three times a day (TID) | ORAL | Status: DC | PRN
  Administered 2023-11-25 – 2023-11-30 (×2): 25 mg via ORAL
  Filled 2023-11-21 (×3): qty 1

## 2023-11-21 MED ORDER — CALCIUM CARBONATE ANTACID 1250 MG/5ML PO SUSP
500.0000 mg | Freq: Four times a day (QID) | ORAL | Status: DC | PRN
  Filled 2023-11-21: qty 5

## 2023-11-21 MED ORDER — ACETAMINOPHEN 650 MG RE SUPP: 650.0000 mg | Freq: Four times a day (QID) | RECTAL | Status: DC | PRN

## 2023-11-21 MED ORDER — SODIUM CHLORIDE 0.9 % IV SOLN
3.0000 g | Freq: Once | INTRAVENOUS | Status: AC
  Administered 2023-11-21: 3 g via INTRAVENOUS
  Filled 2023-11-21: qty 8

## 2023-11-21 MED ORDER — CAMPHOR-MENTHOL 0.5-0.5 % EX LOTN: 1.0000 | TOPICAL_LOTION | Freq: Three times a day (TID) | CUTANEOUS | Status: DC | PRN

## 2023-11-21 NOTE — ED Provider Notes (Signed)
 [facility] EMERGENCY DEPARTMENT AT [facility] Provider Note   CSN: [id number] Arrival date & time: 11/21/23  1426     History  No chief complaint on file.   [name] [name] is a 58 y.o. female.  HPI Patient presented in for potential osteomyelitis of left foot.  States her foot has been this way for a while but really not been getting better.  Denies fevers or chills.  Is a dialysis patient and is due for dialysis tomorrow.   Past Medical History:  Diagnosis Date   Colitis    Diabetes mellitus    ESRD (end stage renal disease) (H[facility])    on HD (M,W,F)   Family history of adverse reaction to anesthesia    " my uncle's heart stoped "   Fracture of femoral neck, right ([facility]) 10/02/2020   GERD (gastroesophageal reflux disease)    Heart murmur    Hypertension    Impaired vision in both eyes 11/19/2015   Mitral regurgitation    Neuropathy in diabetes (HCC)    Syncope and collapse 07/28/2015    Home Medications Prior to Admission medications   Medication Sig Start Date End Date Taking? Authorizing Provider  amLODipine  (NORVASC ) 10 MG tablet Take 1 tablet (10 mg total) by mouth daily. 05/22/22   Rai, Hurman Maiden, MD  BISACODYL  PO Take 5 mg by mouth daily as needed (constipation).    [provider]  Calcium  Carbonate Antacid (TUMS PO) Take 1 Dose by mouth 3 (three) times daily with meals.    [provider]  hydrALAZINE  (APRESOLINE ) 100 MG tablet Take 1 tablet (100 mg total) by mouth 3 (three) times daily. 05/22/22   Rai, Hurman Maiden, MD  hydrOXYzine  (ATARAX ) 25 MG tablet Take 1 tablet (25 mg total) by mouth every 8 (eight) hours as needed for itching. 05/22/22   Rai, Hurman Maiden, MD  ondansetron  (ZOFRAN ) 4 MG tablet Take 1 tablet (4 mg total) by mouth every 8 (eight) hours as needed for nausea or vomiting. 05/22/22   Rai, Ripudeep K, MD  senna-docusate (SENOKOT-S) 8.6-50 MG tablet Take 2 tablets by mouth 2 (two) times daily. Patient not taking: Reported on  04/08/2021 10/16/20   Ardelia Kohut, MD      Allergies    Amitriptyline , Ciprofloxacin , Semaglutide, Tramadol , and Tylenol  [acetaminophen ]    Review of Systems   Review of Systems  Physical Exam Updated Vital Signs BP (!) 126/56   Pulse (!) 59   Temp 99.8 F (37.7 C) (Oral)   Resp 18   Ht 5' (1.524 m)   Wt 59.9 kg   SpO2 99%   BMI 25.78 kg/m  Physical Exam Constitutional:      Appearance: She is normal weight.  Musculoskeletal:     Comments: Black eschar left heel.  No drainage.  Both lower extremities had cushions on the heels.  Skin:    Capillary Refill: Capillary refill takes less than 2 seconds.  Neurological:     Mental Status: She is alert.     ED Results / Procedures / Treatments   Labs (all labs ordered are listed, but only abnormal results are displayed) Labs Reviewed  CULTURE, BLOOD (ROUTINE X 2)  CULTURE, BLOOD (ROUTINE X 2)  COMPREHENSIVE METABOLIC PANEL WITH GFR  CBC WITH DIFFERENTIAL/PLATELET  I-STAT CG4 LACTIC ACID, ED    EKG None  Radiology No results found.  Procedures Procedures    Medications Ordered in ED Medications - No data to display  ED Course/  Medical Decision Making/ A&P                                 Medical Decision Making Amount and/or Complexity of Data Reviewed Labs: ordered. Radiology: ordered.   Patient sent in for potential osteomyelitis of left heel.  Due for dialysis.  By vital signs does not appear septic.  However had x-ray done as an outpatient that showed potential osteomyelitis.   Will get x-ray here and blood work.   X-ray shows potentially some air.  Waiting on read.  Blood work pending.  Care turned over to Dr. Linder Revere.  Have given antibiotics to cover moderate lower extremity infection.                 Final Clinical Impression(s) / ED Diagnoses Final diagnoses:  None    Rx / DC Orders ED Discharge Orders     None         Mozell Arias, MD 11/21/23 [phone]

## 2023-11-21 NOTE — H&P (Signed)
 History and Physical    Patient: [name] [name]:[id number] DOB: 01-18-1966 DOA: 11/21/2023 DOS: the patient was seen and examined on 11/21/2023 PCP: Alwyn Juba., MD  Patient coming from: SNF  Chief Complaint:  Chief Complaint  Patient presents with   Gangrene in foot   HPI: [name] [name] is a 58 y.o. female with medical history significant of end-stage renal disease on hemodialysis Mondays Wednesdays and Fridays, GERD, essential hypertension, left eye blindness, diabetes type 2, history of lumbar radiculopathy who came in from skilled nursing facility at [facility] with left heel wound and suspicion for possible gangrene.  Patient has had the open wound, undergoing treatment.  The wound has gotten worse.  No fever or chills.  No nausea vomiting or diarrhea.  Patient has what appears to be diabetic foot ulcer around the heel.  Workup in the ER showed wound infection and cellulitis.  No obvious sign of osteomyelitis.  Patient also found to have hemoglobin of 6.1 with no obvious source of bleeding.  Denied any melena no bright red blood per rectum.  She has chronic anemia from kidney disease.  Patient is on dialysis and relatively immunocompromised with being admitted for treatment.  Review of Systems: As mentioned in the history of present illness. All other systems reviewed and are negative. Past Medical History:  Diagnosis Date   Colitis    Diabetes mellitus    ESRD (end stage renal disease) (HCC)    on HD (M,W,F)   Family history of adverse reaction to anesthesia    " my uncle's heart stoped "   Fracture of femoral neck, right (HCC) 10/02/2020   GERD (gastroesophageal reflux disease)    Heart murmur    Hypertension    Impaired vision in both eyes 11/19/2015   Mitral regurgitation    Neuropathy in diabetes [facility])    Syncope and collapse 07/28/2015   Past Surgical History:  Procedure Laterality Date   ABDOMINAL HYSTERECTOMY  1999   for fibroids. she thinks one ovary was  left   CATARACT EXTRACTION     CESAREAN SECTION     CHOLECYSTECTOMY     EYE SURGERY Left retinal   GANGLION CYST EXCISION     HIP PINNING,CANNULATED Right 10/02/2020   Procedure: CANNULATED HIP PINNING;  Surgeon: Hardy Lia, MD;  Location: [facility] OR;  Service: Orthopedics;  Laterality: Right;   INCISION AND DRAINAGE ABSCESS N/A 04/10/2021   Procedure: INCISION AND DRAINAGE ABSCESS LABIA;  Surgeon: Caralyn Chandler, MD;  Location: [facility] OR;  Service: General;  Laterality: N/A;   IR FLUORO GUIDE CV LINE LEFT  10/13/2020   IR PTA VENOUS EXCEPT DIALYSIS CIRCUIT  10/13/2020   ORIF TIBIA PLATEAU Left 10/09/2020   Procedure: OPEN REDUCTION INTERNAL FIXATION (ORIF) TIBIAL PLATEAU;  Surgeon: Hardy Lia, MD;  Location: [facility] OR;  Service: Orthopedics;  Laterality: Left;   TONSILLECTOMY     TUBAL LIGATION     Social History:  reports that she has never smoked. She has never used smokeless tobacco. She reports that she does not drink alcohol and does not use drugs.  Allergies  Allergen Reactions   Amitriptyline  Swelling    Other reaction(s): Confusion (intolerance)   Ciprofloxacin  Diarrhea and Nausea And Vomiting    Severe vomiting requiring ED visit, IV reglan  and IV fluids     Semaglutide Nausea Only   Tramadol  Itching   Tylenol  [Acetaminophen ] Itching    Family History  Problem Relation Age of Onset   Diabetes Father  Heart attack Father    Aneurysm Mother     Prior to Admission medications   Medication Sig Start Date End Date Taking? Authorizing Provider  amLODipine  (NORVASC ) 10 MG tablet Take 1 tablet (10 mg total) by mouth daily. 05/22/22   Rai, Hurman Maiden, MD  BISACODYL  PO Take 5 mg by mouth daily as needed (constipation).    [provider]  Calcium  Carbonate Antacid (TUMS PO) Take 1 Dose by mouth 3 (three) times daily with meals.    [provider]  hydrALAZINE  (APRESOLINE ) 100 MG tablet Take 1 tablet (100 mg total) by mouth 3 (three) times daily. 05/22/22    Rai, Hurman Maiden, MD  hydrOXYzine  (ATARAX ) 25 MG tablet Take 1 tablet (25 mg total) by mouth every 8 (eight) hours as needed for itching. 05/22/22   Rai, Ripudeep Linnell Richardson, MD  ondansetron  (ZOFRAN ) 4 MG tablet Take 1 tablet (4 mg total) by mouth every 8 (eight) hours as needed for nausea or vomiting. 05/22/22   Rai, Ripudeep K, MD  senna-docusate (SENOKOT-S) 8.6-50 MG tablet Take 2 tablets by mouth 2 (two) times daily. Patient not taking: Reported on 04/08/2021 10/16/20   Ardelia Kohut, MD    Physical Exam: Vitals:   11/21/23 1443 11/21/23 1600 11/21/23 1824 11/21/23 2017  BP: (!) 126/56 (!) 136/54  132/66  Pulse: (!) 59 (!) 55  60  Resp: 18 14  14   Temp: 99.8 F (37.7 C)  98.9 F (37.2 C) 97.8 F (36.6 C)  TempSrc: Oral  Oral Oral  SpO2: 99% 98%    Weight:      Height:       Constitutional: Chronically ill looking, no distress NAD, calm, comfortable Eyes: PERRL, lids and conjunctivae normal ENMT: Mucous membranes are moist. Posterior pharynx clear of any exudate or lesions.Normal dentition.  Neck: normal, supple, no masses, no thyromegaly Respiratory: clear to auscultation bilaterally, no wheezing, no crackles. Normal respiratory effort. No accessory muscle use.  Cardiovascular: Sinus bradycardia, no murmurs / rubs / gallops. No extremity edema. 2+ pedal pulses. No carotid bruits.  Abdomen: no tenderness, no masses palpated. No hepatosplenomegaly. Bowel sounds positive.  Musculoskeletal: Good range of motion, no joint swelling or tenderness, Skin: Large left heel ulcer, appears infected, deep base Neurologic: CN 2-12 grossly intact. Sensation intact, DTR normal. Strength 5/5 in all 4.  Psychiatric: Normal judgment and insight. Alert and oriented x 3. Normal mood  Data Reviewed:  Temperature 99.8, blood pressure 140/52, pulse 55 respiratory rate 98.  White count is 18.8 hemoglobin 6.1, platelets 422.  Sodium 133 potassium 3.2 chloride 96 CO2 27.  BUN 22 creatinine 3.85 calcium  7.8.  Glucose  260  Assessment and Plan:  #1 infected foot ulcer with cellulitis: Patient will be admitted.  Initiate IV Vanco and cefepime .  Blood cultures were obtained.  Likely will need wound care consult or possible podiatric referral on consult.  #2 end-stage renal disease: Hemodialysis Mondays Wednesdays and Fridays.  Nephrology consulted  #3 symptomatic anemia: Hemoglobin 6.1.  Will transfuse 1 unit of packed red blood cells.  Most likely due to chronic kidney disease.  #4 leukocytosis: Probably from infection.  Continue to monitor.  #5 hyponatremia: Continue to correct with dialysis.  #6 thrombocytosis: Probably chronic inflammation.  #7 GERD: Continue with PPIs  #8 type 2 diabetes: Sliding scale insulin .  #9 essential hypertension: Will continue home blood pressure medications.    Advance Care Planning:   Code Status: Prior   Consults: Nephrology  Family Communication: No family  at bedside  Severity of Illness: The appropriate patient status for this patient is INPATIENT. Inpatient status is judged to be reasonable and necessary in order to provide the required intensity of service to ensure the patient's safety. The patient's presenting symptoms, physical exam findings, and initial radiographic and laboratory data in the context of their chronic comorbidities is felt to place them at high risk for further clinical deterioration. Furthermore, it is not anticipated that the patient will be medically stable for discharge from the hospital within 2 midnights of admission.   * I certify that at the point of admission it is my clinical judgment that the patient will require inpatient hospital care spanning beyond 2 midnights from the point of admission due to high intensity of service, high risk for further deterioration and high frequency of surveillance required.*  AuthorCarolin Chyle, MD 11/21/2023 8:32 PM  For on call review [email or web address].

## 2023-11-21 NOTE — ED Triage Notes (Signed)
 Patient comes in via [facility] from [facility] for possible left hill gangrene and infection.  Tuesday, Thursday Saturday Dialysis patient Blind in left eye

## 2023-11-21 NOTE — ED Notes (Signed)
[facility] called 

## 2023-11-21 NOTE — ED Provider Notes (Signed)
 58 year old female presenting emergency department for possible left foot infection.  Signed out to me by Dr. Val Garin.  See his note for full HPI.  Unclear how long lesions been going on.  Outpatient x-ray with concerning gas  in soft tissue.  Repeat x-ray today similar findings.  Does have a leukocytosis, but no fever or tachycardia.  Lactate not elevated.  ESRD; no significant metabolic derangements.  Potassium 3.6.  She is anemic with a hemoglobin of 6.1.  Blood transfusion ordered.  Was started on empiric antibiotics by prior team.  Will admit for further antibiotics.  CRITICAL CARE Performed by: Rolinda Climes   Total critical care time: 30 minutes  Critical care time was exclusive of separately billable procedures and treating other patients.  Critical care was necessary to treat or prevent imminent or life-threatening deterioration.  Critical care was time spent personally by me on the following activities: development of treatment plan with patient and/or surrogate as well as nursing, discussions with consultants, evaluation of patient's response to treatment, examination of patient, obtaining history from patient or surrogate, ordering and performing treatments and interventions, ordering and review of laboratory studies, ordering and review of radiographic studies, pulse oximetry and re-evaluation of patient's condition.    Rolinda Climes, DO 11/21/23 1826

## 2023-11-21 NOTE — Progress Notes (Addendum)
 Pharmacy Antibiotic Note  [name] [name] is a 58 y.o. female admitted on 11/21/2023 with gangrene in foot.  Pharmacy has been consulted for Vancomycin  and Cefepime  dosing. Linezolid  and Unasyn  doses given in ED.   Note hx ESRD on dialysis Tuesday, Thursday, Saturday.    Plan: Cefepime  1g IV q24h Vancomycin  1250 mg IV x1 loading dose, first dose on 5/20  Vancomycin  post-HD doses (if patient tolerates full HD session): <=60 kg = 500 mg Measure Vanc levels as needed.  Goal AUC = 400 - 550. Follow up hemodialysis schedule, culture results, and clinical course.    Height: 5' (152.4 cm) Weight: 59.9 kg (132 lb) IBW/kg (Calculated) : 45.5  Temp (24hrs), Avg:98.7 F (37.1 C), Min:97.8 F (36.6 C), Max:99.8 F (37.7 C)  Recent Labs  Lab 11/21/23 1641 11/21/23 1647  WBC 18.8*  --   CREATININE 3.85*  --   LATICACIDVEN  --  0.9    Estimated Creatinine Clearance: 13.1 mL/min (A) (by C-G formula based on SCr of 3.85 mg/dL (H)).    Allergies  Allergen Reactions   Amitriptyline  Swelling    Other reaction(s): Confusion (intolerance)   Ciprofloxacin  Diarrhea and Nausea And Vomiting    Severe vomiting requiring ED visit, IV reglan  and IV fluids     Semaglutide Nausea Only   Tramadol  Itching   Tylenol  [Acetaminophen ] Itching    Antimicrobials this admission: 5/19 Unasyn  x1 5/19 linezolid  x1 5/20 Cefepime  >>  5/20 Vancomycin  >>   Dose adjustments this admission:   Microbiology results: 5/19 BCx: ip  Thank you for allowing pharmacy to be a part of this patient's care.  Kendall Pauls PharmD, BCPS [facility] main pharmacy [phone] 11/21/2023 8:58 PM

## 2023-11-22 ENCOUNTER — Inpatient Hospital Stay (HOSPITAL_COMMUNITY)

## 2023-11-22 DIAGNOSIS — N186 End stage renal disease: Secondary | ICD-10-CM

## 2023-11-22 DIAGNOSIS — L03116 Cellulitis of left lower limb: Secondary | ICD-10-CM | POA: Diagnosis not present

## 2023-11-22 DIAGNOSIS — Z992 Dependence on renal dialysis: Secondary | ICD-10-CM

## 2023-11-22 DIAGNOSIS — I10 Essential (primary) hypertension: Secondary | ICD-10-CM

## 2023-11-22 DIAGNOSIS — E1149 Type 2 diabetes mellitus with other diabetic neurological complication: Secondary | ICD-10-CM | POA: Diagnosis not present

## 2023-11-22 DIAGNOSIS — D631 Anemia in chronic kidney disease: Secondary | ICD-10-CM

## 2023-11-22 DIAGNOSIS — E1122 Type 2 diabetes mellitus with diabetic chronic kidney disease: Secondary | ICD-10-CM | POA: Diagnosis not present

## 2023-11-22 DIAGNOSIS — K219 Gastro-esophageal reflux disease without esophagitis: Secondary | ICD-10-CM

## 2023-11-22 LAB — CBC
HCT: 30.3 % — ABNORMAL LOW (ref 36.0–46.0)
Hemoglobin: 9.1 g/dL — ABNORMAL LOW (ref 12.0–15.0)
MCH: 19.7 pg — ABNORMAL LOW (ref 26.0–34.0)
MCHC: 30 g/dL (ref 30.0–36.0)
MCV: 65.6 fL — ABNORMAL LOW (ref 80.0–100.0)
Platelets: 376 10*3/uL (ref 150–400)
RBC: 4.62 MIL/uL (ref 3.87–5.11)
RDW: 24.5 % — ABNORMAL HIGH (ref 11.5–15.5)
WBC: 17.7 10*3/uL — ABNORMAL HIGH (ref 4.0–10.5)
nRBC: 0 % (ref 0.0–0.2)

## 2023-11-22 LAB — RENAL FUNCTION PANEL
Albumin: 2.5 g/dL — ABNORMAL LOW (ref 3.5–5.0)
Anion gap: 12 (ref 5–15)
BUN: 23 mg/dL — ABNORMAL HIGH (ref 6–20)
CO2: 25 mmol/L (ref 22–32)
Calcium: 8 mg/dL — ABNORMAL LOW (ref 8.9–10.3)
Chloride: 95 mmol/L — ABNORMAL LOW (ref 98–111)
Creatinine, Ser: 3.97 mg/dL — ABNORMAL HIGH (ref 0.44–1.00)
GFR, Estimated: 13 mL/min — ABNORMAL LOW (ref >60)
Glucose, Bld: 311 mg/dL — ABNORMAL HIGH (ref 70–99)
Phosphorus: 2.1 mg/dL — ABNORMAL LOW (ref 2.5–4.6)
Potassium: 3.6 mmol/L (ref 3.5–5.1)
Sodium: 132 mmol/L — ABNORMAL LOW (ref 135–145)

## 2023-11-22 LAB — BPAM RBC
Blood Product Expiration Date: 202506182359
ISSUE DATE / TIME: 202505192003
Unit Type and Rh: 5100

## 2023-11-22 LAB — TYPE AND SCREEN
ABO/RH(D): O POS
Antibody Screen: NEGATIVE
Unit division: 0

## 2023-11-22 LAB — HEMOGLOBIN A1C
Hgb A1c MFr Bld: 7.1 % — ABNORMAL HIGH (ref 4.8–5.6)
Mean Plasma Glucose: 157.07 mg/dL

## 2023-11-22 LAB — PREPARE RBC (CROSSMATCH)

## 2023-11-22 LAB — HEPATITIS B SURFACE ANTIGEN: Hepatitis B Surface Ag: NONREACTIVE

## 2023-11-22 LAB — HEMOGLOBIN AND HEMATOCRIT, BLOOD
HCT: 28.2 % — ABNORMAL LOW (ref 36.0–46.0)
Hemoglobin: 8.4 g/dL — ABNORMAL LOW (ref 12.0–15.0)

## 2023-11-22 MED ORDER — VANCOMYCIN HCL 750 MG/150ML IV SOLN
750.0000 mg | INTRAVENOUS | Status: AC
  Administered 2023-11-24: 750 mg via INTRAVENOUS
  Filled 2023-11-22: qty 150

## 2023-11-22 MED ORDER — CHLORHEXIDINE GLUCONATE CLOTH 2 % EX PADS: 6.0000 | MEDICATED_PAD | Freq: Every day | CUTANEOUS | Status: DC

## 2023-11-22 MED ORDER — CALCITRIOL 0.25 MCG PO CAPS
0.2500 ug | ORAL_CAPSULE | ORAL | Status: DC
  Administered 2023-11-24 – 2023-12-03 (×4): 0.25 ug via ORAL
  Filled 2023-11-22 (×6): qty 1

## 2023-11-22 MED ORDER — HEPARIN SODIUM (PORCINE) 1000 UNIT/ML IJ SOLN
INTRAMUSCULAR | Status: AC
  Filled 2023-11-22: qty 5

## 2023-11-22 MED ORDER — HEPARIN SODIUM (PORCINE) 1000 UNIT/ML DIALYSIS
1000.0000 [IU] | INTRAMUSCULAR | Status: DC | PRN
  Administered 2023-11-23: 3800 [IU]

## 2023-11-22 MED ORDER — HYDROMORPHONE HCL 1 MG/ML IJ SOLN
0.5000 mg | Freq: Once | INTRAMUSCULAR | Status: AC | PRN
  Administered 2023-11-22: 0.5 mg via INTRAVENOUS
  Filled 2023-11-22: qty 0.5

## 2023-11-22 MED ORDER — CLONIDINE HCL 0.1 MG PO TABS: 0.1000 mg | ORAL_TABLET | Freq: Once | ORAL | Status: DC

## 2023-11-22 MED ORDER — HEPARIN SODIUM (PORCINE) 1000 UNIT/ML DIALYSIS
5000.0000 [IU] | INTRAMUSCULAR | Status: DC | PRN
  Administered 2023-11-22 – 2023-11-24 (×2): 5000 [IU] via INTRAVENOUS_CENTRAL

## 2023-11-22 MED ORDER — ALTEPLASE 2 MG IJ SOLR: 2.0000 mg | Freq: Once | INTRAMUSCULAR | Status: DC | PRN

## 2023-11-22 MED ORDER — CLONIDINE HCL 0.2 MG/24HR TD PTWK
0.2000 mg | MEDICATED_PATCH | TRANSDERMAL | Status: DC
  Filled 2023-11-22: qty 1

## 2023-11-22 MED ORDER — FENTANYL CITRATE PF 50 MCG/ML IJ SOSY
12.5000 ug | PREFILLED_SYRINGE | INTRAMUSCULAR | Status: DC | PRN
  Administered 2023-11-22: 12.5 ug via INTRAVENOUS
  Administered 2023-11-23 (×2): 25 ug via INTRAVENOUS
  Filled 2023-11-22 (×4): qty 1

## 2023-11-22 MED ORDER — CLONIDINE HCL 0.1 MG/24HR TD PTWK: 0.1000 mg | MEDICATED_PATCH | TRANSDERMAL | Status: DC

## 2023-11-22 MED ORDER — METOPROLOL TARTRATE 50 MG PO TABS
50.0000 mg | ORAL_TABLET | Freq: Two times a day (BID) | ORAL | Status: DC
  Administered 2023-11-22 – 2023-12-06 (×14): 50 mg via ORAL
  Filled 2023-11-22 (×20): qty 1

## 2023-11-22 NOTE — Inpatient Diabetes Management (Signed)
 Inpatient Diabetes Program Recommendations  AACE/ADA: New Consensus Statement on Inpatient Glycemic Control   Target Ranges:  Prepandial:   less than 140 mg/dL      Peak postprandial:   less than 180 mg/dL (1-2 hours)      Critically ill patients:  140 - 180 mg/dL    Latest Reference Range & Units 11/21/23 16:41 11/22/23 00:49  Glucose 70 - 99 mg/dL 409 (H) 811 (H)    Latest Reference Range & Units 11/22/23 00:49  Hemoglobin A1C 4.8 - 5.6 % 7.1 (H)   Review of Glycemic Control  Diabetes history: DM2 Outpatient Diabetes medications: None Current orders for Inpatient glycemic control: Novolog  0-6 units TID with meals, Novolog  0-5 units QHS  Inpatient Diabetes Program Recommendations:    Insulin : Novolog  correction ordered to start at 8am today. Per MAR, Novolog  correction was refused this morning at 8am. Please make sure patient is aware that lab glucose was 311 mg/dl this morning and encourage her to take Novolog  correction as ordered when needed especially given she has a foot infection.  NOTE: Sent chat message to [place], LPN and Dr. Gillermo Lack regarding Novolog  correction being refused this morning and to inquire about CBG and encouraging patient to take Novolog  as ordered when needed.  Thanks, Beacher Limerick, RN, MSN, CDCES Diabetes Coordinator Inpatient Diabetes Program [phone] (Team Pager from 8am to 5pm)

## 2023-11-22 NOTE — Progress Notes (Signed)
 PROGRESS NOTE    [name] [name]  MWN:[id number] DOB: 08-17-1965 DOA: 11/21/2023 PCP: Alwyn Juba., MD   Brief Narrative:  The patient 58 year old African-American female with a past medical history significant for ESRD on hemodialysis Tuesday Thursday Saturdays, GERD, essential hypertension, left eye blindness, diabetes mellitus type 2 which is fairly well-controlled, history of lumbar radiculopathy presented from her SNF from [facility] with a left heel wound and cellulitis and suspicion for possible gangrene.  She has been undergoing wound care treatments but states that the foot has gotten worse.  Workup in the ED shows a wound infection and cellulitis but there is concern for deeper infection so we will obtain an MRI of the heel and foot and obtain vascular ABIs.  On presentation she is found to have a hemoglobin of 6.1 with no obvious source of bleeding so she is typed and screened and transfused 1 unit PRBCs.  Nephrology is consulted for maintenance of hemodialysis and pending further workup may need to consult vascular surgery and orthopedic surgery for further evaluation.  Assessment and Plan:  Infected Diabetic Foot ulcer with Cellulitis: The unstageable pressure heel left ulcer with a wound bed black eschar.  States that she sees the wound clinic but is not getting better.  Check ABIs today to evaluate for PVD. Initiate IV Vanco and cefepime  for now and she received Unasyn  and linezolid  in the ED.  Blood cultures x 2 were obtained and pending.  Check MRI of the foot and heel to evaluate for osteomyelitis and further deeper infection.  DG foot showed the patient had a probable soft tissue ulcer posterior to the calcaneus with patchy soft tissue gas in the plantar lateral hindfoot and midfoot but no obvious bony destructive changes.  There is limited evaluation and soft tissue ulcer may extend to the bone along the posterior calcaneus.  Also try soft tissue edema with prominent vascular  calcifications and punctate densities in the soft tissues.  Pending the MRI findings and ABI findings may need vascular and orthopedic surgery involvement   Diabetes Mellitus Type 2 located by diabetic neuropathy retinopathy: HbA1c was 7.1. C/w Sensitive Novolog  SSI AC/HS. Has been refusing her Insulin  regimen. CTM CBG's per Protocol Has been refusing CBG checks  HypoNa+: Na+ went from 133 -> 132. CTM and Trend and likely to be corrected in Dialysis  Hypophosphatemia: Phos Level was 2.1. To be corrected in HD. CTM and trend and repeat Phos Level in the AM  Essential HTN: C/w Amlodipine  10 mg po Daily and Metoprolol Tartrate 50 mg po BID. CTM BP per Protocol. Last BP reading   ESRD on HD TTSat: BUN/Cr has gone from 22/3.85 -> 23/3.97. Nephrology Consulted for Maintenance of HD. Avoid Nephrotoxic Medications, Contrast Dyes, Hypotension and Dehydration to Ensure Adequate Renal Perfusion and will need to Renally Adjust Meds. CTM and Trend Renal Function carefully and repeat CMP in the AM   Symptomatic Anemia/Microcytic Anemia/Anemia of Chronic Kidney Disease: Hgb/Hct went from 6.1/20.9 -> 8.4/28.2 -> 28.2 -> 9.1/30.3. S/p 1 units of pRBCs. CTM for S/Sx of Bleeding; No overt bleeding noted. Repeat CBC in the AM   Thrombocytosis: Likely Reactive. Plt Count went from 422 -> 376. CTM and Trend and repeat CBC in the AM  Hypoalbuminemia: Patient's Albumin  Level is now 2.5. CTM and Trend and repeat CMP in the AM  Overweight: Complicates overall prognosis and care. Estimated body mass index is 28.03 kg/m as calculated from the following:   Height as of this encounter:  5' (1.524 m).   Weight as of this encounter: 65.1 kg. Weight Loss and Dietary Counseling given   DVT prophylaxis: SCDs Start: 11/21/23 2128    Code Status: Full Code Family Communication: No family present @ Bedside  Disposition Plan:  Level of care: Med-Surg Status is: Inpatient Remains inpatient appropriate because: Needs further  workup for Left heel Ulcer and Cellulitis   Consultants:  Nephrology  Procedures:  As delineated as above  Antimicrobials:  Anti-infectives (From admission, onward)    Start     Dose/Rate Route Frequency Ordered Stop   11/24/23 1200  vancomycin  (VANCOREADY) IVPB 750 mg/150 mL        750 mg 150 mL/hr over 60 Minutes Intravenous Every T-Th-Sa (Hemodialysis) 11/22/23 1437 11/26/23 1159   11/22/23 0600  Vancomycin  (VANCOCIN ) 1,250 mg in sodium chloride  0.9 % 250 mL IVPB        1,250 mg 166.7 mL/hr over 90 Minutes Intravenous  Once 11/21/23 2122 11/22/23 0714   11/22/23 0200  piperacillin -tazobactam (ZOSYN ) IVPB 2.25 g  Status:  Discontinued        2.25 g 100 mL/hr over 30 Minutes Intravenous Every 8 hours 11/21/23 2122 11/21/23 2126   11/21/23 2230  ceFEPIme  (MAXIPIME ) 1 g in sodium chloride  0.9 % 100 mL IVPB        1 g 200 mL/hr over 30 Minutes Intravenous Every 24 hours 11/21/23 2126     11/21/23 2120  vancomycin  variable dose per unstable renal function (pharmacist dosing)         Does not apply See admin instructions 11/21/23 2122     11/21/23 1600  Ampicillin -Sulbactam (UNASYN ) 3 g in sodium chloride  0.9 % 100 mL IVPB       Placed in "And" Linked Group   3 g 200 mL/hr over 30 Minutes Intravenous  Once 11/21/23 1547 11/21/23 1740   11/21/23 1600  linezolid  (ZYVOX ) IVPB 600 mg       Placed in "And" Linked Group   600 mg 300 mL/hr over 60 Minutes Intravenous  Once 11/21/23 1547 11/21/23 1808       Subjective: Seen and examined at bedside was complaining of pain.  Also complaining of the leg being "cold".  Had some nausea earlier but no vomiting.  Denies any lightheadedness or dizziness.  No other concerns or complaints at this time.  Objective: Vitals:   11/22/23 1100 11/22/23 1354 11/22/23 1602 11/22/23 1604  BP: (!) 185/74 (!) 193/70 (!) 192/80 (!) 192/80  Pulse:   74 74  Resp:    17  Temp:      TempSrc:      SpO2:    95%  Weight:      Height:        Intake/Output  Summary (Last 24 hours) at 11/22/2023 1747 Last data filed at 11/22/2023 1300 Gross per 24 hour  Intake 1160 ml  Output --  Net 1160 ml   Filed Weights   11/21/23 1438 11/22/23 0510  Weight: 59.9 kg 65.1 kg   Examination: Physical Exam:  Constitutional: WN/WD overweight chronically ill-appearing African-American female who appears Respiratory: Diminished to auscultation bilaterally with some coarse breath sounds, no wheezing, rales, rhonchi or crackles. Normal respiratory effort and patient is not tachypenic. No accessory muscle use.  Unlabored breathing Cardiovascular: RRR, no murmurs / rubs / gallops. S1 and S2 auscultated.  Has some lower extremity edema Abdomen: Soft, non-tender, distended secondary to body habitus. Bowel sounds positive.  GU: Deferred. Musculoskeletal: No clubbing / cyanosis  of digits/nails. No joint deformity upper and lower extremities  Skin: Has a left heel unstageable ulcer with some erythema and black eschar noted. Neurologic: CN 2-12 grossly intact with no focal deficits some blindness and impaired vision in her eyes.. Romberg sign cerebellar reflexes not assessed.  Psychiatric: Normal judgment and insight. Alert and oriented x 3.   Data Reviewed: I have personally reviewed following labs and imaging studies  CBC: Recent Labs  Lab 11/21/23 1641 11/22/23 0049 11/22/23 0819  WBC 18.8*  --  17.7*  NEUTROABS 14.3*  --   --   HGB 6.1* 8.4* 9.1*  HCT 20.9* 28.2* 30.3*  MCV 63.3*  --  65.6*  PLT 422*  --  376   Basic Metabolic Panel: Recent Labs  Lab 11/21/23 1641 11/22/23 0049  NA 133* 132*  K 3.6 3.6  CL 96* 95*  CO2 27 25  GLUCOSE 260* 311*  BUN 22* 23*  CREATININE 3.85* 3.97*  CALCIUM  7.8* 8.0*  PHOS  --  2.1*   GFR: Estimated Creatinine Clearance: 13.2 mL/min (A) (by C-G formula based on SCr of 3.97 mg/dL (H)). Liver Function Tests: Recent Labs  Lab 11/21/23 1641 11/22/23 0049  AST 14*  --   ALT 7  --   ALKPHOS 116  --   BILITOT  0.6  --   PROT 6.6  --   ALBUMIN  2.5* 2.5*   No results for input(s): "LIPASE", "AMYLASE" in the last 168 hours. No results for input(s): "AMMONIA" in the last 168 hours. Coagulation Profile: No results for input(s): "INR", "PROTIME" in the last 168 hours. Cardiac Enzymes: No results for input(s): "CKTOTAL", "CKMB", "CKMBINDEX", "TROPONINI" in the last 168 hours. BNP (last 3 results) No results for input(s): "PROBNP" in the last 8760 hours. HbA1C: Recent Labs    11/22/23 0049  HGBA1C 7.1*   CBG: No results for input(s): "GLUCAP" in the last 168 hours. Lipid Profile: No results for input(s): "CHOL", "HDL", "LDLCALC", "TRIG", "CHOLHDL", "LDLDIRECT" in the last 72 hours. Thyroid  Function Tests: No results for input(s): "TSH", "T4TOTAL", "FREET4", "T3FREE", "THYROIDAB" in the last 72 hours. Anemia Panel: No results for input(s): "VITAMINB12", "FOLATE", "FERRITIN", "TIBC", "IRON ", "RETICCTPCT" in the last 72 hours. Sepsis Labs: Recent Labs  Lab 11/21/23 1647  LATICACIDVEN 0.9   Recent Results (from the past 240 hours)  Blood Cultures x 2 sites     Status: None (Preliminary result)   Collection Time: 11/21/23  4:41 PM   Specimen: BLOOD LEFT ARM  Result Value Ref Range Status   Specimen Description   Final    BLOOD LEFT ARM Performed at [facility], [address] [place], [place] [address]    Special Requests   Final    BOTTLES DRAWN AEROBIC AND ANAEROBIC Blood Culture results may not be optimal due to an inadequate volume of blood received in culture bottles Performed at [facility], [address] [place], [place] [address]    Culture   Final    NO GROWTH < 12 HOURS Performed at [facility], [address], [place], [place] [address]    Report Status PENDING  Incomplete  Culture, blood (Routine X 2) w Reflex to ID Panel     Status: None (Preliminary result)   Collection Time: 11/22/23 12:48 AM   Specimen: BLOOD  Result Value Ref  Range Status   Specimen Description BLOOD RIGHT ANTECUBITAL  Final   Special Requests   Final    BOTTLES DRAWN  AEROBIC AND ANAEROBIC Blood Culture adequate volume   Culture   Final    NO GROWTH < 12 HOURS Performed at [facility], [address], [place], [place] [address]    Report Status PENDING  Incomplete    Radiology Studies: DG Foot Complete Left Result Date: 11/21/2023 CLINICAL DATA:  Infection.  Left foot gangrene. EXAM: LEFT FOOT - COMPLETE 3+ VIEW COMPARISON:  None Available. FINDINGS: The bones are diffusely under mineralized. Hammertoe deformity of the digits further limits assessment. Patchy subcutaneous gas in the hindfoot with the probable ulcer posterior to the calcaneus. Soft tissue gas is seen laterally the hindfoot and midfoot. There is no obvious bony destructive change, however the degree of bony under mineralization limits assessment. Soft tissue ulcer may extend to bone involving the posterior calcaneus. No evidence of acute fracture. Generalized soft tissue edema with prominent vascular calcifications. Punctate densities in the plantar soft tissues may be calcifications or tiny radiopaque foreign bodies. IMPRESSION: 1. Probable soft tissue ulcer posterior to the calcaneus. Patchy soft tissue gas is seen in the plantar and lateral hindfoot and midfoot. 2. No obvious bony destructive change, however the degree of bony under mineralization limits assessment. Soft tissue ulcer may extend to bone involving the posterior calcaneus. 3. Generalized soft tissue edema with prominent vascular calcifications. Punctate densities in the plantar soft tissues may be calcifications or tiny radiopaque foreign bodies. Electronically Signed   By: Chadwick Colonel M.D.   On: 11/21/2023 17:10   Scheduled Meds:  sodium chloride    Intravenous Once   amLODipine   10 mg Oral Daily   calcium  carbonate  1 tablet Oral TID WC   Chlorhexidine  Gluconate Cloth  6 each Topical Q0600   [START ON  11/23/2023] Chlorhexidine  Gluconate Cloth  6 each Topical Q0600   hydrALAZINE   100 mg Oral Q8H   insulin  aspart  0-5 Units Subcutaneous QHS   insulin  aspart  0-6 Units Subcutaneous TID WC   metoprolol tartrate  50 mg Oral BID   vancomycin  variable dose per unstable renal function (pharmacist dosing)   Does not apply See admin instructions   Continuous Infusions:  ceFEPime  (MAXIPIME ) IV 1 g (11/22/23 0013)   [START ON 11/24/2023] vancomycin       LOS: 1 day   Aura Leeds, DO [facility] Available via Epic secure chat 7am-7pm After these hours, please refer to coverage provider listed on [email or web address] 11/22/2023, 5:47 PM

## 2023-11-22 NOTE — Progress Notes (Signed)
 HD Note:  Some information was entered later than the data was gathered due to patient care needs. The stated time with the data is accurate.  Received patient in bed to unit.   Alert and oriented.   Informed consent signed and in chart.   Access used: Left TDC.  Dressing changed per protocol Access issues: None  Patient did not tolerated treatment due to hypotension episodes throughout session requiring NS Bolus and UF adjustments.  MD Yvonnie Heritage made aware. VS stable.   TX duration: 3.5 hrs  Alert, without acute distress.  Total UF removed: 1.2L  Hand-off given to patient's nurse.   Transported back to the room   Barron Lien RN [facility].

## 2023-11-22 NOTE — Progress Notes (Signed)
 Pt refused blood sugar check and insulin  this morning.   1440- This nurse ask the patient again about checking blood sugar and pt does not want them check. Nurse ask pt why and she stated " because I just don't want it". Dr. Gillermo Lack is aware.

## 2023-11-22 NOTE — Consult Note (Addendum)
 [facility] Renal Consultation Note    Indication for Consultation:  Management of ESRD/hemodialysis, anemia, hypertension/volume, and secondary hyperparathyroidism.  HPI: [name] [name] is a 58 y.o. female with PMH of ESRD, GERD, HTN, left eye blindness, T2DM, hx of lumbar radiculopathy. She was brought to the hospital for worsening left heel wound. She has a diabetic foot ulcer around her right heel. ED workup showed infection of the wound and cellulitis. No sign of osteo. Patient also had hgb of 6.1 with no clear source of bleeding. She is from [facility].  Patient is on HD at [facility] and dialyzes on TTS. Patient recently transferred from an outside practice to the facility.  She has been on HD for 2 years. We were consulted to help coordinate HD while she is in the hospital.   Past Medical History:  Diagnosis Date   Colitis    Diabetes mellitus    ESRD (end stage renal disease) (HCC)    on HD (M,W,F)   Family history of adverse reaction to anesthesia    " my uncle's heart stoped "   Fracture of femoral neck, right (HCC) 10/02/2020   GERD (gastroesophageal reflux disease)    Heart murmur    Hypertension    Impaired vision in both eyes 11/19/2015   Mitral regurgitation    Neuropathy in diabetes (HCC)    Syncope and collapse 07/28/2015   Past Surgical History:  Procedure Laterality Date   ABDOMINAL HYSTERECTOMY  1999   for fibroids. she thinks one ovary was left   CATARACT EXTRACTION     CESAREAN SECTION     CHOLECYSTECTOMY     EYE SURGERY Left retinal   GANGLION CYST EXCISION     HIP PINNING,CANNULATED Right 10/02/2020   Procedure: CANNULATED HIP PINNING;  Surgeon: Hardy Lia, MD;  Location: [facility] OR;  Service: Orthopedics;  Laterality: Right;   INCISION AND DRAINAGE ABSCESS N/A 04/10/2021   Procedure: INCISION AND DRAINAGE ABSCESS LABIA;  Surgeon: Caralyn Chandler, MD;  Location: [facility] OR;  Service: General;  Laterality: N/A;   IR FLUORO GUIDE CV LINE  LEFT  10/13/2020   IR PTA VENOUS EXCEPT DIALYSIS CIRCUIT  10/13/2020   ORIF TIBIA PLATEAU Left 10/09/2020   Procedure: OPEN REDUCTION INTERNAL FIXATION (ORIF) TIBIAL PLATEAU;  Surgeon: Hardy Lia, MD;  Location: [facility] OR;  Service: Orthopedics;  Laterality: Left;   TONSILLECTOMY     TUBAL LIGATION     Family History  Problem Relation Age of Onset   Diabetes Father    Heart attack Father    Aneurysm Mother    Social History:  reports that she has never smoked. She has never used smokeless tobacco. She reports that she does not drink alcohol and does not use drugs.  ROS: As per HPI otherwise negative. Aaron Aas  Physical Exam: Vitals:   11/22/23 0545 11/22/23 0833 11/22/23 1100 11/22/23 1354  BP: (!) 181/73 (!) 169/69 (!) 185/74 (!) 193/70  Pulse:  (!) 59    Resp:  18    Temp:  98.4 F (36.9 C)    TempSrc:  Oral    SpO2:  100%    Weight:      Height:         General: Chronically ill appearing female, NAD Head: Normocephalic, atraumatic, sclera non-icteric, mucus membranes are moist. Neck: Supple without lymphadenopathy/masses. JVD not elevated. Lungs: scattered crackles, no wheezing or rhonchi appreciated Heart: RRR with faint systolic murmur. Abdomen: Soft, non-tender, non-distended with normoactive bowel  sounds. No rebound/guarding. No obvious abdominal masses. Lower extremities: Left foot in ACE dressing with notable odor, some weeping through bandage. Neuro: Alert and oriented X 3. Moves all extremities spontaneously. Psych:  Responds to questions appropriately with a normal affect. Dialysis Access: [facility]  Allergies  Allergen Reactions   Amitriptyline  Swelling    Other reaction(s): Confusion (intolerance)   Ciprofloxacin  Diarrhea and Nausea And Vomiting    Severe vomiting requiring ED visit, IV reglan  and IV fluids     Semaglutide Nausea Only   Tramadol  Itching   Tylenol  [Acetaminophen ] Itching   Prior to Admission medications   Medication Sig Start Date End Date  Taking? Authorizing Provider  amLODipine  (NORVASC ) 10 MG tablet Take 1 tablet (10 mg total) by mouth daily. 05/22/22   Rai, Hurman Maiden, MD  BISACODYL  PO Take 5 mg by mouth daily as needed (constipation).    [provider]  Calcium  Carbonate Antacid (TUMS PO) Take 1 Dose by mouth 3 (three) times daily with meals.    [provider]  cloNIDine  (CATAPRES  - DOSED IN MG/24 HR) 0.3 mg/24hr patch Place 0.3 mg onto the skin once a week.    [provider]  ELIQUIS 5 MG TABS tablet Take 5 mg by mouth 2 (two) times daily. 11/21/23   [provider]  fluticasone (FLONASE) 50 MCG/ACT nasal spray Place 1 spray into both nostrils daily.    [provider]  hydrALAZINE  (APRESOLINE ) 100 MG tablet Take 1 tablet (100 mg total) by mouth 3 (three) times daily. 05/22/22   Rai, Hurman Maiden, MD  HYDROcodone -acetaminophen  (NORCO/VICODIN) 5-325 MG tablet Take 1 tablet by mouth every 6 (six) hours as needed for moderate pain (pain score 4-6).    [provider]  hydrOXYzine  (ATARAX ) 25 MG tablet Take 1 tablet (25 mg total) by mouth every 8 (eight) hours as needed for itching. 05/22/22   Rai, Hurman Maiden, MD  isosorbide  mononitrate (IMDUR) 30 MG 24 hr tablet Take 30 mg by mouth daily. 11/16/23   [provider]  losartan  (COZAAR ) 50 MG tablet Take 50 mg by mouth daily. 09/27/23   [provider]  metoprolol tartrate (LOPRESSOR) 50 MG tablet Take 50 mg by mouth. 10/29/23   [provider]  nystatin cream (MYCOSTATIN) Apply 1 Application topically 2 (two) times daily. 11/16/23   [provider]  omeprazole  (PRILOSEC) 40 MG capsule Take 40 mg by mouth 2 (two) times daily. 11/11/23   [provider]  ondansetron  (ZOFRAN ) 4 MG tablet Take 1 tablet (4 mg total) by mouth every 8 (eight) hours as needed for nausea or vomiting. 05/22/22   Rai, Hurman Maiden, MD  oxyCODONE  (OXY IR/ROXICODONE ) 5 MG immediate release tablet Take 5 mg by mouth 4 (four)  times daily as needed. 08/01/23   [provider]  pregabalin  (LYRICA ) 100 MG capsule Take 100 mg by mouth. 11/11/23   [provider]  SANTYL 250 UNIT/GM ointment Apply 1 Application topically daily. 11/09/23   [provider]  senna-docusate (SENOKOT-S) 8.6-50 MG tablet Take 2 tablets by mouth 2 (two) times daily. Patient not taking: Reported on 04/08/2021 10/16/20   Ardelia Kohut, MD   Current Facility-Administered Medications  Medication Dose Route Frequency Provider Last Rate Last Admin   0.9 %  sodium chloride  infusion (Manually program via Guardrails IV Fluids)   Intravenous Once Garba, Mohammad L, MD       acetaminophen  (TYLENOL ) tablet 650 mg  650 mg Oral Q6H PRN Davida Espy, MD  Or   acetaminophen  (TYLENOL ) suppository 650 mg  650 mg Rectal Q6H PRN Davida Espy, MD       amLODipine  (NORVASC ) tablet 10 mg  10 mg Oral Daily Sudie Ely L, MD   10 mg at 11/22/23 1100   calcium  carbonate (dosed in mg elemental calcium ) suspension 500 mg of elemental calcium   500 mg of elemental calcium  Oral Q6H PRN Davida Espy, MD       calcium  carbonate (TUMS - dosed in mg elemental calcium ) chewable tablet 200 mg of elemental calcium   1 tablet Oral TID WC Sudie Ely L, MD   200 mg of elemental calcium  at 11/22/23 0835   camphor-menthol  (SARNA) lotion 1 Application  1 Application Topical Q8H PRN Davida Espy, MD       And   hydrOXYzine  (ATARAX ) tablet 25 mg  25 mg Oral Q8H PRN Davida Espy, MD       ceFEPIme  (MAXIPIME ) 1 g in sodium chloride  0.9 % 100 mL IVPB  1 g Intravenous Q24H Shade, Christine E, RPH 200 mL/hr at 11/22/23 0013 1 g at 11/22/23 0013   Chlorhexidine  Gluconate Cloth 2 % PADS 6 each  6 each Topical Q0600 Davida Espy, MD       [START ON 11/23/2023] Chlorhexidine  Gluconate Cloth 2 % PADS 6 each  6 each Topical Q0600 Rubbie Cornwall, NP       docusate sodium  [facility]) enema 283 mg  1 enema Rectal PRN Davida Espy, MD        feeding supplement (NEPRO CARB STEADY) liquid 237 mL  237 mL Oral TID PRN Davida Espy, MD       fentaNYL  (SUBLIMAZE ) injection 12.5-25 mcg  12.5-25 mcg Intravenous Q2H PRN Sheikh, Omair Latif, DO       hydrALAZINE  (APRESOLINE ) tablet 100 mg  100 mg Oral Q8H Carlton Chick, Mohammad L, MD   100 mg at 11/22/23 1354   insulin  aspart (novoLOG ) injection 0-5 Units  0-5 Units Subcutaneous QHS Davida Espy, MD       insulin  aspart (novoLOG ) injection 0-6 Units  0-6 Units Subcutaneous TID WC Garba, Mohammad L, MD       ondansetron  (ZOFRAN ) tablet 4 mg  4 mg Oral Q6H PRN Davida Espy, MD       Or   ondansetron  (ZOFRAN ) injection 4 mg  4 mg Intravenous Q6H PRN Davida Espy, MD       sorbitol  70 % solution 30 mL  30 mL Oral PRN Davida Espy, MD       vancomycin  variable dose per unstable renal function (pharmacist dosing)   Does not apply See admin instructions Lovett Ruck [facility]       Labs: Basic Metabolic Panel: Recent Labs  Lab 11/21/23 1641 11/22/23 0049  NA 133* 132*  K 3.6 3.6  CL 96* 95*  CO2 27 25  GLUCOSE 260* 311*  BUN 22* 23*  CREATININE 3.85* 3.97*  CALCIUM  7.8* 8.0*  PHOS  --  2.1*   Liver Function Tests: Recent Labs  Lab 11/21/23 1641 11/22/23 0049  AST 14*  --   ALT 7  --   ALKPHOS 116  --   BILITOT 0.6  --   PROT 6.6  --   ALBUMIN  2.5* 2.5*   No results for input(s): "LIPASE", "AMYLASE" in the last 168 hours. No results for input(s): "AMMONIA" in the last 168 hours. CBC: Recent Labs  Lab 11/21/23 1641 11/22/23 0049 11/22/23  0819  WBC 18.8*  --  17.7*  NEUTROABS 14.3*  --   --   HGB 6.1* 8.4* 9.1*  HCT 20.9* 28.2* 30.3*  MCV 63.3*  --  65.6*  PLT 422*  --  376   Cardiac Enzymes: No results for input(s): "CKTOTAL", "CKMB", "CKMBINDEX", "TROPONINI" in the last 168 hours. CBG: No results for input(s): "GLUCAP" in the last 168 hours. Iron  Studies: No results for input(s): "IRON ", "TIBC", "TRANSFERRIN", "FERRITIN" in the last 72  hours. Studies/Results: DG Foot Complete Left Result Date: 11/21/2023 CLINICAL DATA:  Infection.  Left foot gangrene. EXAM: LEFT FOOT - COMPLETE 3+ VIEW COMPARISON:  None Available. FINDINGS: The bones are diffusely under mineralized. Hammertoe deformity of the digits further limits assessment. Patchy subcutaneous gas in the hindfoot with the probable ulcer posterior to the calcaneus. Soft tissue gas is seen laterally the hindfoot and midfoot. There is no obvious bony destructive change, however the degree of bony under mineralization limits assessment. Soft tissue ulcer may extend to bone involving the posterior calcaneus. No evidence of acute fracture. Generalized soft tissue edema with prominent vascular calcifications. Punctate densities in the plantar soft tissues may be calcifications or tiny radiopaque foreign bodies. IMPRESSION: 1. Probable soft tissue ulcer posterior to the calcaneus. Patchy soft tissue gas is seen in the plantar and lateral hindfoot and midfoot. 2. No obvious bony destructive change, however the degree of bony under mineralization limits assessment. Soft tissue ulcer may extend to bone involving the posterior calcaneus. 3. Generalized soft tissue edema with prominent vascular calcifications. Punctate densities in the plantar soft tissues may be calcifications or tiny radiopaque foreign bodies. Electronically Signed   By: Chadwick Colonel M.D.   On: 11/21/2023 17:10    Dialysis Orders:  GO TTS 3.75 hours  Heparin  5000 units BFR 350/ DFR 600 3K/2.5 Ca bath EDW 64 kg TDC Mircera 225 mcg q 2 weeks. Last dose 11/17/23 Calcitriol 0.25 mcg q HD  Assessment/Plan:  Infected foot ulcer with cellulitis: BC pending. Patient to be started on Vanc and Cefepime  per pharm. WBC 17.7. Soft tissue gas seen laterally in hindfoot and midfoot on DG foot left.   ESRD:  last HD Saturday 5/17. Plan for HD today to continue her on her OP schedule.   Hypertension/volume: BP high today. I suspect  that fluid is driving this high BP pressure. +1 LE edema on exam. I restarted her home dose of metoprolol 50 mg daily.   Anemia: Last ESA given on 11/17/23. On max dose of ESA. Next ESA dose 12/01/23 DM2: On SSI per primary.   Metabolic bone disease: corrected ca 9.2. Phos 2.1 and not on home binder.   Nutrition:  albumin  2.5 today. Start protein supplements and renal diet with fluid restrictions.   Hersey Lorenzo, NP-C 11/22/2023, 2:01 PM  [facility]    Seen and examined independently.  Agree with note and exam as documented above by physician extender and as noted here.  Patient with ESRD on HD recently transferred to [place] from an outside practice.  She presented with worsening foot ulcer concerning for possible osteo.  Team obtaining imaging.  With regard to her HTN, the patient tells me that she has a clonidine  patch on.  She was previously on a clonidine  patch but I am unable to locate any clonidine  patch on her; nurse did not locate anything on assessment earlier or with me.  Communication from her SNF about medications has been delayed in the past - she was  recently on 0.3 mg patch and at one point had a higher dose.  Has been titrated down over time.      General adult female in bed in no acute distress HEENT normocephalic atraumatic extraocular movements intact sclera anicteric Neck supple trachea midline Lungs clear to auscultation bilaterally normal work of breathing at rest  Heart S1S2 no rub Abdomen soft nontender nondistended Extremities 1+ edema ; left foot is wrapped  Psych flat mood and affect Neuro alert and conversant; provides basic history Access Left internal jugular tunn catheter in place   Left Foot wound - abx and imaging per primary team   ESRD - HD per TTS schedule. Orders in for today   HTN - noted beta blocker was resumed.  clonidine  once now and resume clonidine  patch at 0.2 mg dosing for now.  She was previously on clonidine  and this  has been weaned down over time.     Anemia of CKD - on max ESA outpt, recently dosed. Appears s/p PRBC  Metabolic bone disease - phos is low. Defer binder. On calcitriol   Thank you for the consult.  Please do not hesitate to contact me with any questions regarding our patient    Nan Aver, MD 11/22/2023  7:24 PM

## 2023-11-22 NOTE — Plan of Care (Signed)
  Problem: Pain Managment: Goal: General experience of comfort will improve and/or be controlled Outcome: Progressing   Problem: Safety: Goal: Ability to remain free from injury will improve Outcome: Progressing   Problem: Skin Integrity: Goal: Risk for impaired skin integrity will decrease Outcome: Progressing

## 2023-11-22 NOTE — Hospital Course (Signed)
 The patient 58 year old African-American female with a past medical history significant for ESRD on hemodialysis Tuesday Thursday Saturdays, GERD, essential hypertension, left eye blindness, diabetes mellitus type 2 which is fairly well-controlled, history of lumbar radiculopathy presented from her SNF from [facility] with a left heel wound and cellulitis and suspicion for possible gangrene.  She has been undergoing wound care treatments but states that the foot has gotten worse.  Workup in the ED shows a wound infection and cellulitis but there is concern for deeper infection so we will obtain an MRI of the heel and foot and obtain vascular ABIs.  On presentation she is found to have a hemoglobin of 6.1 with no obvious source of bleeding so she is typed and screened and transfused 1 unit PRBCs.  Nephrology is consulted for maintenance of hemodialysis and pending further workup may need to consult vascular surgery and orthopedic surgery for further evaluation.  Assessment and Plan:  Infected Diabetic Foot ulcer with Cellulitis: The unstageable pressure heel left ulcer with a wound bed black eschar.  States that she sees the wound clinic but is not getting better.  Check ABIs today to evaluate for PVD. Initiate IV Vanco and cefepime  for now and she received Unasyn  and linezolid  in the ED.  Blood cultures x 2 were obtained and pending.  Check MRI of the foot and heel to evaluate for osteomyelitis and further deeper infection.  DG foot showed the patient had a probable soft tissue ulcer posterior to the calcaneus with patchy soft tissue gas in the plantar lateral hindfoot and midfoot but no obvious bony destructive changes.  There is limited evaluation and soft tissue ulcer may extend to the bone along the posterior calcaneus.  Also try soft tissue edema with prominent vascular calcifications and punctate densities in the soft tissues.  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 C/w Sensitive Novolog  SSI AC/HS. Has been refusing her Insulin  regimen. CTM CBG's per Protocol Has been refusing CBG checks  HypoNa+: Na+ went from 133 -> 132. CTM and Trend and likely to be corrected in Dialysis  Hypophosphatemia: Phos Level was 2.1. To be corrected in HD. CTM and trend and repeat Phos Level in the AM  Essential HTN: C/w Amlodipine  10 mg po Daily and Metoprolol Tartrate 50 mg po BID. CTM BP per Protocol. Last BP reading   ESRD on HD TTSat: BUN/Cr has gone from 22/3.85 -> 23/3.97. Nephrology Consulted for Maintenance of HD. Avoid Nephrotoxic Medications, Contrast Dyes, Hypotension and Dehydration to Ensure Adequate Renal Perfusion and will need to Renally Adjust Meds. CTM and Trend Renal Function carefully and repeat CMP in the AM   Symptomatic Anemia/Microcytic Anemia/Anemia of Chronic Kidney Disease: Hgb/Hct went from 6.1/20.9 -> 8.4/28.2 -> 28.2 -> 9.1/30.3. S/p 1 units of pRBCs. CTM for S/Sx of Bleeding; No overt bleeding noted. Repeat CBC in the AM   Thrombocytosis: Likely Reactive. Plt Count went from 422 -> 376. CTM and Trend and repeat CBC in the AM  Hypoalbuminemia: Patient's Albumin  Level is now 2.5. CTM and Trend and repeat CMP in the AM  Overweight: Complicates overall prognosis and care. Estimated body mass index is 28.03 kg/m as calculated from the following:   Height as of this encounter: 5' (1.524 m).   Weight as of this encounter: 65.1 kg. Weight Loss and Dietary Counseling given

## 2023-11-22 NOTE — Consult Note (Signed)
 WOC Nurse Consult Note: Reason for Consult: L heel ulcer  Wound type: Unstageable Pressure Injury L heel  Pressure Injury POA: Yes Measurement: see nursing flowsheet  Wound bed: black eschar  Drainage (amount, consistency, odor) no drainage per MD note  Periwound:   Dressing procedure/placement/frequency:  Cleanse L heel ulcer with Vashe Timm Foot [phone]) do not rinse and allow to air dry. Apply Xeroform gauze (Lawson [id number]) to wound bed daily, cover with dry gauze and place in Prevalon boot to offload pressure.   Patient would benefit from ongoing management of this wound by podiatry or orthopedics.  POC discussed with bedside nurse. WOC team will not follow.   Thank you,    Ronni Colace MSN, RN-BC, [employer] [phone]

## 2023-11-22 NOTE — Plan of Care (Signed)
  Problem: Clinical Measurements: Goal: Respiratory complications will improve Outcome: Progressing   Problem: Nutrition: Goal: Adequate nutrition will be maintained Outcome: Progressing   Problem: Pain Managment: Goal: General experience of comfort will improve and/or be controlled Outcome: Progressing

## 2023-11-22 NOTE — Progress Notes (Signed)
 Phlebotomy attempt multiple times to draw labs and unsuccessful. Dr. Gillermo Lack has been notified.

## 2023-11-22 NOTE — Plan of Care (Signed)
  Problem: Pain Managment: Goal: General experience of comfort will improve and/or be controlled Outcome: Progressing   Problem: Safety: Goal: Ability to remain free from injury will improve Outcome: Progressing

## 2023-11-22 NOTE — Progress Notes (Signed)
 Patient arrive to [facility] room 11, Alert and Oriented. Pain level 8/10 at left heel wound.Transferred from strecher to bed. Bed in lowest position. Call light in reach.

## 2023-11-23 DIAGNOSIS — L03116 Cellulitis of left lower limb: Secondary | ICD-10-CM | POA: Diagnosis not present

## 2023-11-23 LAB — CBC WITH DIFFERENTIAL/PLATELET
Abs Immature Granulocytes: 0 10*3/uL (ref 0.00–0.07)
Basophils Absolute: 0 10*3/uL (ref 0.0–0.1)
Basophils Relative: 0 %
Eosinophils Absolute: 0.6 10*3/uL — ABNORMAL HIGH (ref 0.0–0.5)
Eosinophils Relative: 3 %
HCT: 26.7 % — ABNORMAL LOW (ref 36.0–46.0)
Hemoglobin: 8.4 g/dL — ABNORMAL LOW (ref 12.0–15.0)
Lymphocytes Relative: 11 %
Lymphs Abs: 2.2 10*3/uL (ref 0.7–4.0)
MCH: 20.2 pg — ABNORMAL LOW (ref 26.0–34.0)
MCHC: 31.5 g/dL (ref 30.0–36.0)
MCV: 64.2 fL — ABNORMAL LOW (ref 80.0–100.0)
Monocytes Absolute: 1 10*3/uL (ref 0.1–1.0)
Monocytes Relative: 5 %
Neutro Abs: 16 10*3/uL — ABNORMAL HIGH (ref 1.7–7.7)
Neutrophils Relative %: 81 %
Platelets: 343 10*3/uL (ref 150–400)
RBC: 4.16 MIL/uL (ref 3.87–5.11)
RDW: 24.1 % — ABNORMAL HIGH (ref 11.5–15.5)
WBC: 19.8 10*3/uL — ABNORMAL HIGH (ref 4.0–10.5)
nRBC: 0 /100{WBCs}
nRBC: 0.2 % (ref 0.0–0.2)

## 2023-11-23 LAB — MISC LABCORP TEST (SEND OUT): Labcorp test code: 83935

## 2023-11-23 LAB — RENAL FUNCTION PANEL
Albumin: 2.1 g/dL — ABNORMAL LOW (ref 3.5–5.0)
Anion gap: 11 (ref 5–15)
BUN: 10 mg/dL (ref 6–20)
CO2: 24 mmol/L (ref 22–32)
Calcium: 8 mg/dL — ABNORMAL LOW (ref 8.9–10.3)
Chloride: 98 mmol/L (ref 98–111)
Creatinine, Ser: 2.14 mg/dL — ABNORMAL HIGH (ref 0.44–1.00)
GFR, Estimated: 26 mL/min — ABNORMAL LOW (ref >60)
Glucose, Bld: 152 mg/dL — ABNORMAL HIGH (ref 70–99)
Phosphorus: 1.2 mg/dL — ABNORMAL LOW (ref 2.5–4.6)
Potassium: 3.9 mmol/L (ref 3.5–5.1)
Sodium: 133 mmol/L — ABNORMAL LOW (ref 135–145)

## 2023-11-23 MED ORDER — VANCOMYCIN HCL 750 MG/150ML IV SOLN
750.0000 mg | Freq: Once | INTRAVENOUS | Status: AC
  Administered 2023-11-23: 750 mg via INTRAVENOUS
  Filled 2023-11-23: qty 150

## 2023-11-23 MED ORDER — HYDROMORPHONE HCL 1 MG/ML IJ SOLN
0.5000 mg | INTRAMUSCULAR | Status: DC | PRN
  Administered 2023-11-24 – 2023-11-25 (×5): 0.5 mg via INTRAVENOUS
  Filled 2023-11-23 (×4): qty 0.5

## 2023-11-23 MED ORDER — HEPARIN SODIUM (PORCINE) 1000 UNIT/ML IJ SOLN
INTRAMUSCULAR | Status: AC
  Filled 2023-11-23: qty 4

## 2023-11-23 MED ORDER — SODIUM PHOSPHATES 45 MMOLE/15ML IV SOLN
30.0000 mmol | Freq: Once | INTRAVENOUS | Status: AC
  Administered 2023-11-23: 30 mmol via INTRAVENOUS
  Filled 2023-11-23: qty 10

## 2023-11-23 MED ORDER — CLONIDINE HCL 0.1 MG/24HR TD PTWK
0.1000 mg | MEDICATED_PATCH | TRANSDERMAL | Status: DC
  Administered 2023-11-23 – 2023-11-30 (×2): 0.1 mg via TRANSDERMAL
  Filled 2023-11-23 (×2): qty 1

## 2023-11-23 MED ORDER — CHLORHEXIDINE GLUCONATE CLOTH 2 % EX PADS: 6.0000 | MEDICATED_PAD | Freq: Every day | CUTANEOUS | Status: DC

## 2023-11-23 NOTE — Progress Notes (Signed)
 Pt receives out-pt HD at [facility] on TTS 11:40 am chair time. Pt admitted from snf per notes. Will assist as needed.   Lauraine Polite Renal Navigator [phone]

## 2023-11-23 NOTE — Progress Notes (Signed)
 [facility] Progress Note  Name: [name] [name] MRN: [id number] DOB: 15-Feb-1966  Chief Complaint:  Foot wound   Subjective:  Last HD on 5/20 with 1.2 kg UF.  Her UF goal was decreased for hypotension. Her MAR from her SNF is scanned and her blood pressure regimen appears atypical with taking some meds on non-HD days and at one point on clonidine  0.5 mg patch (combining a 0.2 mg and 0.3 mg patch).  Intermittently appears to have been given meds.  Her clonidine  patch wasn't applied as she was going to a procedure.  She tells me is on; her RN and I searched her torso and her RN found where this was charted as "not given".  Hypotension with HD yesterday.     Review of systems:  Denies shortness of breath or chest pain  Denies n/v ------------------ Background on consult:  [name] [name] is a 58 y.o. female with PMH of ESRD, GERD, HTN, left eye blindness, T2DM, hx of lumbar radiculopathy. She was brought to the hospital for worsening left heel wound. She has a diabetic foot ulcer around her right heel. ED workup showed infection of the wound and cellulitis. No sign of osteo. Patient also had hgb of 6.1 with no clear source of bleeding. She is from [facility].   Patient is on HD at [facility] and dialyzes on TTS. Patient recently transferred from an outside practice to the facility.  She has been on HD for 2 years. We were consulted to help coordinate HD while she is in the hospital.     Intake/Output Summary (Last 24 hours) at 11/23/2023 1320 Last data filed at 11/23/2023 0900 Gross per 24 hour  Intake 480 ml  Output 1200 ml  Net -720 ml    Vitals:  Vitals:   11/23/23 0216 11/23/23 0500 11/23/23 0541 11/23/23 0800  BP:  105/65 (!) 128/49 (!) 147/61  Pulse:  67  66  Resp:    17  Temp:  100.1 F (37.8 C)  98 F (36.7 C)  TempSrc:  Oral    SpO2:  99%  100%  Weight: 63.8 kg     Height:         Physical Exam:  General adult female in bed in no acute  distress HEENT normocephalic atraumatic extraocular movements intact sclera anicteric Neck supple trachea midline Lungs clear to auscultation bilaterally normal work of breathing at rest  Heart S1S2 no rub Abdomen soft nontender nondistended Extremities 1+ pedal edema; left foot is wrapped with foul smelling wound Psych flat mood and affect Neuro alert and conversant; provides basic history Access Left internal jugular tunn catheter in place   Medications reviewed   Labs:     Latest Ref Rng & Units 11/23/2023    6:31 AM 11/22/2023   12:49 AM 11/21/2023    4:41 PM  BMP  Glucose 70 - 99 mg/dL 782  956  213   BUN 6 - 20 mg/dL 10  23  22    Creatinine 0.44 - 1.00 mg/dL 0.86  5.78  4.69   Sodium 135 - 145 mmol/L 133  132  133   Potassium 3.5 - 5.1 mmol/L 3.9  3.6  3.6   Chloride 98 - 111 mmol/L 98  95  96   CO2 22 - 32 mmol/L 24  25  27    Calcium  8.9 - 10.3 mg/dL 8.0  8.0  7.8    Dialysis Orders:  GO TTS 3.75 hours  Heparin   5000 units BFR 350/ DFR 600 3K/2.5 Ca bath EDW 64 kg TDC Mircera 225 mcg q 2 weeks. Last dose 11/17/23 Calcitriol 0.25 mcg q HD  Assessment/Plan:   Left Foot wound - abx and imaging per primary team    ESRD - HD per TTS schedule    HTN -  - note on an atypical regimen outpatient including high dose clonidine  patch  - I have reordered her clonidine  patch at a dose of 0.1 mg for now (the 0.2 mg patch was never applied) - May need to reduce amlodipine  - appears was on multiple BP medications every other day outpatient.  No other changes right now as she is presently well controlled  - I reached out to primary team to coordinate care re: BP meds   Anemia of CKD - on max ESA outpt, recently dosed on 5/15.  PRBC's per primary team    Metabolic bone disease - hypophosphatemia - replete.  Defer binder. On calcitriol    Disposition - per primary team   Nan Aver, MD 11/23/2023 2:07 PM

## 2023-11-23 NOTE — Progress Notes (Signed)
 PROGRESS NOTE    [name] [name]  ZOX:[id number] DOB: 08/16/1965 DOA: 11/21/2023 PCP: Alwyn Juba., MD   Brief Narrative:  The patient 58 year old African-American female with a past medical history significant for ESRD on hemodialysis Tuesday Thursday Saturdays, GERD, essential hypertension, left eye blindness, diabetes mellitus type 2 which is fairly well-controlled, history of lumbar radiculopathy presented from her SNF from [facility] with a left heel wound and cellulitis and suspicion for possible gangrene. She has been undergoing wound care treatments but states that the foot has gotten worse. Workup in the ED shows a wound infection and cellulitis but there is concern for deeper infection so we will obtain an MRI of the heel and foot and obtain vascular ABIs. On presentation she is found to have a hemoglobin of 6.1 with no obvious source of bleeding so she is typed and screened and transfused 1 unit PRBCs. Nephrology is consulted for maintenance of hemodialysis and pending further workup may need to consult vascular surgery and orthopedic surgery for further evaluation   Assessment & Plan:   Principal Problem:   Cellulitis of left lower extremity Active Problems:   Diabetic neuropathy (HCC)   Essential hypertension   GERD (gastroesophageal reflux disease)   ESRD (end stage renal disease) (HCC)   DM2 (diabetes mellitus, type 2) (HCC)   Anemia due to chronic kidney disease  Infected Diabetic Foot ulcer with Cellulitis: The unstageable pressure heel left ulcer with a wound bed black eschar.  States that she sees the wound clinic but is not getting better.  she received Unasyn  and linezolid  in the ED and was later started on cefepime  and vancomycin .  Blood cultures x 2 were obtained and pending.  ABI and MRI ordered and pending.  Wound care on board.  Wound is very foul-smelling, while we are waiting for ABI and MRI results to be posted, I do believe that patient will benefit from at  least bedside debridement and thus I have consulted podiatry and sent a message to Dr. Rosemarie Conquest.   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 C/w Sensitive Novolog  SSI AC/HS. Has been refusing her Insulin  regimen.    HypoNa+: Mild, asymptomatic, improving.   Essential HTN: Blood pressure was low overnight but better now.  C/w Amlodipine  10 mg po Daily and Metoprolol Tartrate 50 mg po BID.    ESRD on HD TTSat: Nephrology following.  Appreciate their help with management of this.   Symptomatic Anemia/Microcytic Anemia/Anemia of Chronic Kidney Disease: Globin 6.1 upon arrival, status post 1 unit of PRBC transfusion, hemoglobin improved to 8.4 today.  Thrombocytosis: Improved.  DVT prophylaxis: SCDs Start: 11/21/23 2128   Code Status: Full Code  Family Communication:  None present at bedside.  Plan of care discussed with patient in length and he/she verbalized understanding and agreed with it.  Status is: Inpatient Remains inpatient appropriate because: Needs evaluation by podiatry.   Estimated body mass index is 27.47 kg/m as calculated from the following:   Height as of this encounter: 5' (1.524 m).   Weight as of this encounter: 63.8 kg.    Nutritional Assessment: Body mass index is 27.47 kg/m.Aaron Aas Seen by dietician.  I agree with the assessment and plan as outlined below: Nutrition Status:        . Skin Assessment: I have examined the patient's skin and I agree with the wound assessment as performed by the wound care RN as outlined below:    Consultants:  Podiatry  Procedures:  None  Antimicrobials:  Anti-infectives (From admission, onward)    Start     Dose/Rate Route Frequency Ordered Stop   11/24/23 1200  vancomycin  (VANCOREADY) IVPB 750 mg/150 mL        750 mg 150 mL/hr over 60 Minutes Intravenous Every T-Th-Sa (Hemodialysis) 11/22/23 1437 11/26/23 1159   11/22/23 0600  Vancomycin  (VANCOCIN ) 1,250 mg in sodium chloride   0.9 % 250 mL IVPB        1,250 mg 166.7 mL/hr over 90 Minutes Intravenous  Once 11/21/23 2122 11/22/23 0714   11/22/23 0200  piperacillin -tazobactam (ZOSYN ) IVPB 2.25 g  Status:  Discontinued        2.25 g 100 mL/hr over 30 Minutes Intravenous Every 8 hours 11/21/23 2122 11/21/23 2126   11/21/23 2230  ceFEPIme  (MAXIPIME ) 1 g in sodium chloride  0.9 % 100 mL IVPB        1 g 200 mL/hr over 30 Minutes Intravenous Every 24 hours 11/21/23 2126     11/21/23 2120  vancomycin  variable dose per unstable renal function (pharmacist dosing)         Does not apply See admin instructions 11/21/23 2122     11/21/23 1600  Ampicillin -Sulbactam (UNASYN ) 3 g in sodium chloride  0.9 % 100 mL IVPB       Placed in "And" Linked Group   3 g 200 mL/hr over 30 Minutes Intravenous  Once 11/21/23 1547 11/21/23 1740   11/21/23 1600  linezolid  (ZYVOX ) IVPB 600 mg       Placed in "And" Linked Group   600 mg 300 mL/hr over 60 Minutes Intravenous  Once 11/21/23 1547 11/21/23 1808         Subjective: Patient seen and examined.  She is sleepy.  Despite of me asking her she talk to me, she prefers to sleep, although answers while keeping her eyes closed.  She has no complaints, denied any pain.  Objective: Vitals:   11/23/23 0216 11/23/23 0500 11/23/23 0541 11/23/23 0800  BP:  105/65 (!) 128/49 (!) 147/61  Pulse:  67  66  Resp:    17  Temp:  100.1 F (37.8 C)  98 F (36.7 C)  TempSrc:  Oral    SpO2:  99%  100%  Weight: 63.8 kg     Height:        Intake/Output Summary (Last 24 hours) at 11/23/2023 0810 Last data filed at 11/23/2023 0700 Gross per 24 hour  Intake 960 ml  Output 1200 ml  Net -240 ml   Filed Weights   11/22/23 0510 11/22/23 2130 11/23/23 0216  Weight: 65.1 kg 65.1 kg 63.8 kg    Examination:  General exam: Appears calm and comfortable  Respiratory system: Clear to auscultation. Respiratory effort normal. Cardiovascular system: S1 & S2 heard, RRR. No JVD, murmurs, rubs, gallops or  clicks. No pedal edema. Gastrointestinal system: Abdomen is nondistended, soft and nontender. No organomegaly or masses felt. Normal bowel sounds heard. Central nervous system: Sleepy but oriented. No focal neurological deficits. Extremities: Symmetric 5 x 5 power. Skin: Wet gangrene of the left heel with foul-smelling mild purulent discharge as well as erythema around the wound.  Data Reviewed: I have personally reviewed following labs and imaging studies  CBC: Recent Labs  Lab 11/21/23 1641 11/22/23 0049 11/22/23 0819 11/23/23 0631  WBC 18.8*  --  17.7* 19.8*  NEUTROABS 14.3*  --   --  PENDING  HGB 6.1* 8.4* 9.1* 8.4*  HCT 20.9* 28.2* 30.3* 26.7*  MCV  63.3*  --  65.6* 64.2*  PLT 422*  --  376 343   Basic Metabolic Panel: Recent Labs  Lab 11/21/23 1641 11/22/23 0049 11/23/23 0631  NA 133* 132* 133*  K 3.6 3.6 3.9  CL 96* 95* 98  CO2 27 25 24   GLUCOSE 260* 311* 152*  BUN 22* 23* 10  CREATININE 3.85* 3.97* 2.14*  CALCIUM  7.8* 8.0* 8.0*  PHOS  --  2.1* 1.2*   GFR: Estimated Creatinine Clearance: 24.2 mL/min (A) (by C-G formula based on SCr of 2.14 mg/dL (H)). Liver Function Tests: Recent Labs  Lab 11/21/23 1641 11/22/23 0049 11/23/23 0631  AST 14*  --   --   ALT 7  --   --   ALKPHOS 116  --   --   BILITOT 0.6  --   --   PROT 6.6  --   --   ALBUMIN  2.5* 2.5* 2.1*   No results for input(s): "LIPASE", "AMYLASE" in the last 168 hours. No results for input(s): "AMMONIA" in the last 168 hours. Coagulation Profile: No results for input(s): "INR", "PROTIME" in the last 168 hours. Cardiac Enzymes: No results for input(s): "CKTOTAL", "CKMB", "CKMBINDEX", "TROPONINI" in the last 168 hours. BNP (last 3 results) No results for input(s): "PROBNP" in the last 8760 hours. HbA1C: Recent Labs    11/22/23 0049  HGBA1C 7.1*   CBG: No results for input(s): "GLUCAP" in the last 168 hours. Lipid Profile: No results for input(s): "CHOL", "HDL", "LDLCALC", "TRIG",  "CHOLHDL", "LDLDIRECT" in the last 72 hours. Thyroid  Function Tests: No results for input(s): "TSH", "T4TOTAL", "FREET4", "T3FREE", "THYROIDAB" in the last 72 hours. Anemia Panel: No results for input(s): "VITAMINB12", "FOLATE", "FERRITIN", "TIBC", "IRON ", "RETICCTPCT" in the last 72 hours. Sepsis Labs: Recent Labs  Lab 11/21/23 1647  LATICACIDVEN 0.9    Recent Results (from the past 240 hours)  Blood Cultures x 2 sites     Status: None (Preliminary result)   Collection Time: 11/21/23  4:41 PM   Specimen: BLOOD LEFT ARM  Result Value Ref Range Status   Specimen Description   Final    BLOOD LEFT ARM Performed at [facility], [address] [place], [place] [address]    Special Requests   Final    BOTTLES DRAWN AEROBIC AND ANAEROBIC Blood Culture results may not be optimal due to an inadequate volume of blood received in culture bottles Performed at [facility], [address] [place], [place] [address]    Culture   Final    NO GROWTH 2 DAYS Performed at [facility], [address], [place], [place] [address]    Report Status PENDING  Incomplete  Culture, blood (Routine X 2) w Reflex to ID Panel     Status: None (Preliminary result)   Collection Time: 11/22/23 12:48 AM   Specimen: BLOOD  Result Value Ref Range Status   Specimen Description BLOOD RIGHT ANTECUBITAL  Final   Special Requests   Final    BOTTLES DRAWN AEROBIC AND ANAEROBIC Blood Culture adequate volume   Culture   Final    NO GROWTH 1 DAY Performed at [facility], [address] [place], [place] [address]    Report Status PENDING  Incomplete     Radiology Studies: DG Foot Complete Left Result Date: 11/21/2023 CLINICAL DATA:  Infection.  Left foot gangrene. EXAM: LEFT FOOT - COMPLETE 3+ VIEW COMPARISON:  None Available. FINDINGS: The bones are diffusely under mineralized. Hammertoe  deformity of the digits further limits assessment. Patchy subcutaneous gas in the  hindfoot with the probable ulcer posterior to the calcaneus. Soft tissue gas is seen laterally the hindfoot and midfoot. There is no obvious bony destructive change, however the degree of bony under mineralization limits assessment. Soft tissue ulcer may extend to bone involving the posterior calcaneus. No evidence of acute fracture. Generalized soft tissue edema with prominent vascular calcifications. Punctate densities in the plantar soft tissues may be calcifications or tiny radiopaque foreign bodies. IMPRESSION: 1. Probable soft tissue ulcer posterior to the calcaneus. Patchy soft tissue gas is seen in the plantar and lateral hindfoot and midfoot. 2. No obvious bony destructive change, however the degree of bony under mineralization limits assessment. Soft tissue ulcer may extend to bone involving the posterior calcaneus. 3. Generalized soft tissue edema with prominent vascular calcifications. Punctate densities in the plantar soft tissues may be calcifications or tiny radiopaque foreign bodies. Electronically Signed   By: Chadwick Colonel M.D.   On: 11/21/2023 17:10    Scheduled Meds:  sodium chloride    Intravenous Once   amLODipine   10 mg Oral Daily   [START ON 11/24/2023] calcitRIOL  0.25 mcg Oral Q T,Th,Sa-HD   calcium  carbonate  1 tablet Oral TID WC   Chlorhexidine  Gluconate Cloth  6 each Topical Q0600   Chlorhexidine  Gluconate Cloth  6 each Topical Q0600   cloNIDine   0.2 mg Transdermal Weekly   cloNIDine   0.1 mg Oral Once   hydrALAZINE   100 mg Oral Q8H   insulin  aspart  0-5 Units Subcutaneous QHS   insulin  aspart  0-6 Units Subcutaneous TID WC   metoprolol tartrate  50 mg Oral BID   vancomycin  variable dose per unstable renal function (pharmacist dosing)   Does not apply See admin instructions   Continuous Infusions:  ceFEPime  (MAXIPIME ) IV 1 g (11/22/23 0013)   [START ON 11/24/2023] vancomycin        LOS: 2 days   Modena Andes, MD [facility]  11/23/2023, 8:10 AM   *Please  note that this is a verbal dictation therefore any spelling or grammatical errors are due to the "Dragon [employer] One" system interpretation.  Please page via Amion and do not message via secure chat for urgent patient care matters. Secure chat can be used for non urgent patient care matters.  How to contact the [facility] Attending or Consulting provider 7A - 7P or covering provider during after hours 7P -7A, for this patient?  Check the care team in [facility] and look for a) attending/consulting [facility] provider listed and b) the [facility] team listed. Page or secure chat 7A-7P. Log into [email or web address] and use 's universal password to access. If you do not have the password, please contact the hospital operator. Locate the [facility] provider you are looking for under [facility] and page to a number that you can be directly reached. If you still have difficulty reaching the provider, please page the [facility] (Director on Call) for the Hospitalists listed on amion for assistance.

## 2023-11-23 NOTE — Evaluation (Signed)
 Physical Therapy Evaluation Patient Details Name: [name] [name] MRN: [id number] DOB: 22-Nov-1965 Today's Date: 11/23/2023  History of Present Illness  Pt is a 58 y.o. female who presented 11/21/23 from SNF with L heel wound. MRI showed  abnormal edema posteriorly and medially in the calcaneus compatible with osteomyelitis, marrow edema in the talar neck and adjacent talar body potentially from stress fracture or osteomyelitis, and stress fracture of the proximal metaphysis of the first metatarsal with associated low-grade edema throughout the shaft of the first metatarsal. PMH: ESRD on HD, syncope and collapse, diabetic neuropathy, mitral regurgitation, HTN, heart murmur, colitis, and DM2, L eye blindness.   Clinical Impression  Pt presents with condition above and deficits mentioned below, see PT Problem List. PTA, she was living at a SNF, relying on staff to assist her with hoyer lift transfers OOB. She reports she has not stood or ambulated in "a long time", but denying it being "years" so assuming months. She reports she does not recall the last time she was OOB though. Currently, she displays deficits in generalized strength, worse in her legs than arms, worse distally than proximally, and worse in her L leg than her R but worse in her R UE than her L. She is currently requiring maxA to roll in bed and declined EOB or OOB attempts this date. Noted cloudy, bloody and foul, sweet smelling discharge from pt's vagina along with a sacral ulcer with yellow slough covering ~30-40% of the wound. Notified RN and MD of these findings and request for prevalon boots, WOC consult, and air mattress. Rolled pt to her L side at the end of the session for pressure relief as pt was rolled to the R prior to the session. Pt may benefit from further PT at her SNF upon d/c. Will continue to follow acutely due to potential plan for L BKA, in which pt may benefit from exercises and education on positioning/ROM after.        If plan is discharge home, recommend the following: Two people to help with walking and/or transfers;Two people to help with bathing/dressing/bathroom;Assistance with cooking/housework;Direct supervision/assist for medications management;Direct supervision/assist for financial management;Assist for transportation;Help with stairs or ramp for entrance   Can travel by private vehicle   No    Equipment Recommendations Other (comment);Wheelchair (measurements PT);Wheelchair cushion (measurements PT);Hoyer lift;Hospital bed (air mattress overlay; roho cushion for w/c)  Recommendations for Other Services       Functional Status Assessment Patient has had a recent decline in their functional status and demonstrates the ability to make significant improvements in function in a reasonable and predictable amount of time.     Precautions / Restrictions Precautions Precautions: Fall;Other (comment) Precaution/Restrictions Comments: L eye blind, limited vision R eye, sacral wound, L heel wound Restrictions Weight Bearing Restrictions Per Provider Order: Yes LLE Weight Bearing Per Provider Order: Weight bearing as tolerated (but would try to keep her off the heel per Starr Eddy ortho PA 5/21)      Mobility  Bed Mobility Overal bed mobility: Needs Assistance Bed Mobility: Rolling Rolling: Max assist, Used rails         General bed mobility comments: Pt able to use rails to assist with rolling and transitioning superiorly in bed. She needed extra time and assistance to adjust her trunk as needed to allow her to be in better positions to reach the rails. Limited movement noted at legs with all bed mobility. She required maxA at hips and legs to roll bil  and maxA using bed pad to transition superiorly in bed with bed in trendelenburg position. Pt declined EOB today    Transfers                   General transfer comment: pt declined OOB attempts, uses hoyer at baseline     Ambulation/Gait               General Gait Details: N/A, nonambulatory at baseline  Stairs            Wheelchair Mobility     Tilt Bed    Modified Rankin (Stroke Patients Only)       Balance Overall balance assessment: Needs assistance                                           Pertinent Vitals/Pain Pain Assessment Pain Assessment: Faces Faces Pain Scale: Hurts even more Pain Location: bil legs, L>R especially at L heel Pain Descriptors / Indicators: Discomfort, Guarding, Grimacing, Sore Pain Intervention(s): Limited activity within patient's tolerance, Monitored during session, Repositioned    Home Living Family/patient expects to be discharged to:: Skilled nursing facility                        Prior Function Prior Level of Function : Needs assist             Mobility Comments: Has not stood up or walked in months, has primarily been bedbound at SNF, reporting she does not remember that last time she was OOB but she had been transferring via hoyer lift recently ADLs Comments: likely needed assistance for all ADLs     Extremity/Trunk Assessment   Upper Extremity Assessment Upper Extremity Assessment: Generalized weakness;RUE deficits/detail RUE Deficits / Details: difficulty flexing R shoulder compared to L, achieving ~100-110' shoulder flexion with extra time and effort    Lower Extremity Assessment Lower Extremity Assessment: Generalized weakness;RLE deficits/detail;LLE deficits/detail RLE Deficits / Details: MMT scores grossly </= 3-/5, weaker distally than proximally; reports she can feel touch at feet; barely wiggles toes/foot when cued LLE Deficits / Details: MMT scores grossly </= 2+/5, weaker distally than proximally; reports she can feel touch at feet; barely wiggles toes/foot when cued; wound at L heel    Cervical / Trunk Assessment Cervical / Trunk Assessment: Other exceptions Cervical / Trunk Exceptions:  increased body habitus  Communication   Communication Communication: No apparent difficulties    Cognition Arousal: Alert Behavior During Therapy: Flat affect   PT - Cognitive impairments: No family/caregiver present to determine baseline                       PT - Cognition Comments: Slow processing and difficulty problem solving and sequencing. Unsure if baseline or not. Following commands: Impaired Following commands impaired: Only follows one step commands consistently, Follows one step commands with increased time     Cueing Cueing Techniques: Verbal cues, Tactile cues     General Comments General comments (skin integrity, edema, etc.): noted cloudy, bloody and foul, sweet smelling discharge from pt's vagina; noted sacral ulcer with yellow slough covering ~30-40% of the wound; notified RN and MD of these findings and request for prevalon boots and air mattress; rolled pt to L side end of session as pt was rolled to R prior to session    Exercises  Assessment/Plan    PT Assessment Patient needs continued PT services  PT Problem List Decreased strength;Decreased range of motion;Decreased activity tolerance;Decreased balance;Decreased mobility;Decreased cognition;Decreased skin integrity;Pain       PT Treatment Interventions DME instruction;Functional mobility training;Therapeutic activities;Therapeutic exercise;Balance training;Neuromuscular re-education;Patient/family education;Wheelchair mobility training;Cognitive remediation    PT Goals (Current goals can be found in the Care Plan section)  Acute Rehab PT Goals Patient Stated Goal: to reduce pain PT Goal Formulation: With patient Time For Goal Achievement: 12/07/23 Potential to Achieve Goals: Fair    Frequency Min 1X/week     Co-evaluation               AM-PAC PT "6 Clicks" Mobility  Outcome Measure Help needed turning from your back to your side while in a flat bed without using bedrails?: A  Lot Help needed moving from lying on your back to sitting on the side of a flat bed without using bedrails?: Total Help needed moving to and from a bed to a chair (including a wheelchair)?: Total Help needed standing up from a chair using your arms (e.g., wheelchair or bedside chair)?: Total Help needed to walk in hospital room?: Total Help needed climbing 3-5 steps with a railing? : Total 6 Click Score: 7    End of Session   Activity Tolerance: Patient limited by pain Patient left: in bed;with call bell/phone within reach;with bed alarm set Nurse Communication: Mobility status;Other (comment);Need for lift equipment (vaginal d/c and wounds along with need for air mattress and prevalon boots - notified MD also) PT Visit Diagnosis: Muscle weakness (generalized) (M62.81);Pain Pain - Right/Left:  (bil) Pain - part of body: Leg    Time: 1610-9604 PT Time Calculation (min) (ACUTE ONLY): 20 min   Charges:   PT Evaluation $PT Eval Moderate Complexity: 1 Mod   PT General Charges $$ ACUTE PT VISIT: 1 Visit         Vernida Goodie, PT, DPT Acute Rehabilitation Services  Office: [phone]   Ellyn Hack 11/23/2023, 2:30 PM

## 2023-11-23 NOTE — Progress Notes (Signed)
 Pharmacy Antibiotic Note  [name] [name] is a 58 y.o. female admitted on 11/21/2023 with gangrene in foot.  Pharmacy has been consulted for Vancomycin  and Cefepime  dosing. Linezolid  and Unasyn  doses given in ED.   Note hx ESRD on dialysis Tuesday, Thursday, Saturday. Had HD late 5/20 and into 5/21 and no vancomycin  given dur ing last hour.  Plan: Continue cefepime  1g IV q24h Vancomycin  750 mg IV today then 750 mg after HD TTS Follow up hemodialysis schedule, culture results, and clinical course   Height: 5' (152.4 cm) Weight: 63.8 kg (140 lb 10.5 oz) IBW/kg (Calculated) : 45.5  Temp (24hrs), Avg:99.1 F (37.3 C), Min:98 F (36.7 C), Max:100.1 F (37.8 C)  Recent Labs  Lab 11/21/23 1641 11/21/23 1647 11/22/23 0049 11/22/23 0819 11/23/23 0631  WBC 18.8*  --   --  17.7* 19.8*  CREATININE 3.85*  --  3.97*  --  2.14*  LATICACIDVEN  --  0.9  --   --   --     Estimated Creatinine Clearance: 24.2 mL/min (A) (by C-G formula based on SCr of 2.14 mg/dL (H)).    Allergies  Allergen Reactions   Amitriptyline  Swelling and Other (See Comments)    Hypotension, Mental Status Changes, Confusion - "Allergic," per MAR   Ciprofloxacin  Diarrhea, Nausea And Vomiting and Other (See Comments)    Severe vomiting requiring ED visit, IV reglan  and IV fluids- "Allergic," per MAR   Ibuprofen  Itching   Aspirin  Itching and Other (See Comments)    Pt endorses getting itchy in the past with aspirin . Tolerated dose 04/06/22.   Semaglutide Nausea Only and Other (See Comments)    "Allergic," per MAR   Tramadol  Itching   Tylenol  [Acetaminophen ] Itching and Other (See Comments)    "Allergic," per MAR    Antimicrobials this admission: 5/19 Unasyn  x1 5/19 linezolid  x1 5/20 Cefepime  >>  5/20 Vancomycin  >>   Dose adjustments this admission:   Microbiology results: 5/19 BCx: ngtd  Thank you for involving pharmacy in this patient's care.  Caroline Cinnamon, PharmD, BCPS Clinical Pharmacist Clinical  phone for 11/23/2023 is [phone] 11/23/2023 1:35 PM

## 2023-11-23 NOTE — Plan of Care (Signed)
  Problem: Pain Managment: Goal: General experience of comfort will improve and/or be controlled Outcome: Progressing   Problem: Safety: Goal: Ability to remain free from injury will improve Outcome: Progressing

## 2023-11-23 NOTE — Consult Note (Signed)
 Reason for Consult:Left heel ulcer Referring Physician: Modena Andes Time called: 1000 Time at bedside: 1012   [name] [name] is an 58 y.o. female.  HPI: [place] was admitted yesterday with an infected heel decubitus. She says she's had it for a long time (at least a few months) and it causes a decent amount of pain. She denies fevers, chills, sweats, N/V. No other hx/o foot ulcerations. She lives in a facility.  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 she thinks one ovary was left   CATARACT EXTRACTION     CESAREAN SECTION     CHOLECYSTECTOMY     EYE SURGERY Left retinal   GANGLION CYST EXCISION     HIP PINNING,CANNULATED Right 10/02/2020   Procedure: CANNULATED HIP PINNING;  Surgeon: Hardy Lia, MD;  Location: [facility] OR;  Service: Orthopedics;  Laterality: Right;   INCISION AND DRAINAGE ABSCESS N/A 04/10/2021   Procedure: INCISION AND DRAINAGE ABSCESS LABIA;  Surgeon: Caralyn Chandler, MD;  Location: [facility] OR;  Service: General;  Laterality: N/A;   IR FLUORO GUIDE CV LINE LEFT  10/13/2020   IR PTA VENOUS EXCEPT DIALYSIS CIRCUIT  10/13/2020   ORIF TIBIA PLATEAU Left 10/09/2020   Procedure: OPEN REDUCTION INTERNAL FIXATION (ORIF) TIBIAL PLATEAU;  Surgeon: Hardy Lia, MD;  Location: [facility] OR;  Service: Orthopedics;  Laterality: Left;   TONSILLECTOMY     TUBAL LIGATION      Family History  Problem Relation Age of Onset   Diabetes Father    Heart attack Father     Aneurysm Mother     Social History:  reports that she has never smoked. She has never used smokeless tobacco. She reports that she does not drink alcohol and does not use drugs.  Allergies:  Allergies  Allergen Reactions   Amitriptyline  Swelling and Other (See Comments)    Hypotension, Mental Status Changes, Confusion - "Allergic," per [facility]   Ciprofloxacin  Diarrhea, Nausea And Vomiting and Other (See Comments)    Severe vomiting requiring ED visit, IV reglan  and IV fluids- "Allergic," per [facility]   Ibuprofen  Itching   Aspirin  Itching and Other (See Comments)    Pt endorses getting itchy in the past with aspirin . Tolerated dose 04/06/22.   Semaglutide Nausea Only and Other (See Comments)    "Allergic," per [facility]   Tramadol  Itching   Tylenol  [Acetaminophen ] Itching and Other (See Comments)    "Allergic," per [facility]    Medications: I have reviewed the patient's current medications.  Results for orders placed or performed during the hospital encounter of 11/21/23 (from the past 48 hours)  Comprehensive metabolic panel     Status: Abnormal   Collection Time: 11/21/23  4:41 PM  Result Value Ref Range   Sodium 133 (L) 135 - 145 mmol/L   Potassium 3.6 3.5 - 5.1 mmol/L   Chloride 96 (L)  98 - 111 mmol/L   CO2 27 22 - 32 mmol/L   Glucose, Bld 260 (H) 70 - 99 mg/dL    Comment: Glucose reference range applies only to samples taken after fasting for at least 8 hours.   BUN 22 (H) 6 - 20 mg/dL   Creatinine, Ser 1.61 (H) 0.44 - 1.00 mg/dL   Calcium  7.8 (L) 8.9 - 10.3 mg/dL   Total Protein 6.6 6.5 - 8.1 g/dL   Albumin  2.5 (L) 3.5 - 5.0 g/dL   AST 14 (L) 15 - 41 U/L   ALT 7 0 - 44 U/L   Alkaline Phosphatase 116 38 - 126 U/L   Total Bilirubin 0.6 0.0 - 1.2 mg/dL   GFR, Estimated 13 (L) >60 mL/min    Comment: (NOTE) Calculated using the CKD-EPI Creatinine Equation (2021)    Anion gap 10 5 - 15    Comment: Performed at [facility], [address] [place], [place] [address]   CBC with Differential     Status: Abnormal   Collection Time: 11/21/23  4:41 PM  Result Value Ref Range   WBC 18.8 (H) 4.0 - 10.5 K/uL   RBC 3.30 (L) 3.87 - 5.11 MIL/uL   Hemoglobin 6.1 (LL) 12.0 - 15.0 g/dL    Comment: Reticulocyte Hemoglobin testing may be clinically indicated, consider ordering this additional test [id number] THIS CRITICAL RESULT HAS VERIFIED AND BEEN CALLED TO DASIA M., RN BY REGINA MANGUM ON 05 19 2025 AT 1721, AND HAS BEEN READ BACK.     HCT 20.9 (L) 36.0 - 46.0 %   MCV 63.3 (L) 80.0 - 100.0 fL   MCH 18.5 (L) 26.0 - 34.0 pg   MCHC 29.2 (L) 30.0 - 36.0 g/dL   RDW 14.7 (H) 82.9 - 56.2 %   Platelets 422 (H) 150 - 400 K/uL   nRBC 0.0 0.0 - 0.2 %   Neutrophils Relative % 76 %   Neutro Abs 14.3 (H) 1.7 - 7.7 K/uL   Lymphocytes Relative 16 %   Lymphs Abs 2.9 0.7 - 4.0 K/uL   Monocytes Relative 6 %   Monocytes Absolute 1.2 (H) 0.1 - 1.0 K/uL   Eosinophils Relative 1 %   Eosinophils Absolute 0.2 0.0 - 0.5 K/uL   Basophils Relative 0 %   Basophils Absolute 0.1 0.0 - 0.1 K/uL   Immature Granulocytes 1 %   Abs Immature Granulocytes 0.14 (H) 0.00 - 0.07 K/uL   Polychromasia PRESENT    Target Cells PRESENT    Ovalocytes PRESENT     Comment: Performed at [facility], [address] [place], [place] [address]  Blood Cultures x 2 sites     Status: None (Preliminary result)   Collection Time: 11/21/23  4:41 PM   Specimen: BLOOD LEFT ARM  Result Value Ref Range   Specimen Description      BLOOD LEFT ARM Performed at [facility], [address], [place], [place] [address]    Special Requests      BOTTLES DRAWN AEROBIC AND ANAEROBIC Blood Culture results may not be optimal due to an inadequate volume of blood received in culture bottles Performed at [facility], [address] [place], [place] [address]    Culture      NO GROWTH 2 DAYS Performed at [facility], [address] [place], [place] [address]     Report Status PENDING   I-Stat Lactic Acid, ED  Status: None   Collection Time: 11/21/23  4:47 PM  Result Value Ref Range   Lactic Acid, Venous 0.9 0.5 - 1.9 mmol/L  Type and screen [facility]     Status: None   Collection Time: 11/21/23  5:40 PM  Result Value Ref Range   ABO/RH(D) O POS    Antibody Screen NEG    Sample Expiration 11/24/2023,2359    Unit Number [id number]    Blood Component Type RED CELLS,LR    Unit division 00    Status of Unit ISSUED,FINAL    Transfusion Status OK TO TRANSFUSE    Crossmatch Result      Compatible Performed at [facility], [address], [place], [place] [address]   Prepare RBC (crossmatch)     Status: None   Collection Time: 11/21/23  5:40 PM  Result Value Ref Range   Order Confirmation      ORDER PROCESSED BY BLOOD BANK Performed at [facility], [address] [place], [place] [address]   Prepare RBC (crossmatch)     Status: None   Collection Time: 11/22/23 12:25 AM  Result Value Ref Range   Order Confirmation      ORDER PROCESSED BY BLOOD BANK Performed at [facility], [address] [place], [place] [address]   Culture, blood (Routine X 2) w Reflex to ID Panel     Status: None (Preliminary result)   Collection Time: 11/22/23 12:48 AM   Specimen: BLOOD  Result Value Ref Range   Specimen Description BLOOD RIGHT ANTECUBITAL    Special Requests      BOTTLES DRAWN AEROBIC AND ANAEROBIC Blood Culture adequate volume   Culture      NO GROWTH 1 DAY Performed at [facility], [address] [place], [place] [address]    Report Status PENDING   Miscellaneous [facility] test (send-out)     Status: None   Collection Time: 11/22/23 12:48 AM  Result Value Ref Range   [facility] test code [id number]    [facility] test name [id number]    Source ([facility]) SERUM     Comment: Performed at [facility], [address], [place], [place] [address]   Misc [facility] result COMMENT      Comment: (NOTE) Test Ordered: [id number] HIV Ab/p24 Ag with Reflex HIV Ab/p24 Ag Screen           Note:                     BN     Non Reactive                                                  Reference Range: Non Reactive                          HIV-1/HIV-2 antibodies and HIV-1 p24 antigen were NOT detected. There is no laboratory evidence of HIV infection. HIV Negative Performed At: [facility] [address] [place], [place] [id number] Pearlean Botts MD [id number]   Renal function panel     Status: Abnormal   Collection Time: 11/22/23 12:49 AM  Result Value Ref Range   Sodium 132 (L) 135 - 145 mmol/L   Potassium 3.6 3.5 - 5.1 mmol/L  Chloride 95 (L) 98 - 111 mmol/L   CO2 25 22 - 32 mmol/L   Glucose, Bld 311 (H) 70 - 99 mg/dL    Comment: Glucose reference range applies only to samples taken after fasting for at least 8 hours.   BUN 23 (H) 6 - 20 mg/dL   Creatinine, Ser 6.57 (H) 0.44 - 1.00 mg/dL   Calcium  8.0 (L) 8.9 - 10.3 mg/dL   Phosphorus 2.1 (L) 2.5 - 4.6 mg/dL   Albumin  2.5 (L) 3.5 - 5.0 g/dL   GFR, Estimated 13 (L) >60 mL/min    Comment: (NOTE) Calculated using the CKD-EPI Creatinine Equation (2021)    Anion gap 12 5 - 15    Comment: Performed at [facility], [address] [place], [place] [address]  Hemoglobin A1c     Status: Abnormal   Collection Time: 11/22/23 12:49 AM  Result Value Ref Range   Hgb A1c MFr Bld 7.1 (H) 4.8 - 5.6 %    Comment: (NOTE) Pre diabetes:          5.7%-6.4%  Diabetes:              >6.4%  Glycemic control for   <7.0% adults with diabetes    Mean Plasma Glucose 157.07 mg/dL    Comment: Performed at [facility], [address] [place], [place] [address]  Hemoglobin and hematocrit, blood     Status: Abnormal   Collection Time: 11/22/23 12:49 AM  Result Value Ref Range   Hemoglobin 8.4 (L) 12.0 - 15.0 g/dL   HCT 41.3 (L) 24.4 - 01.0 %    Comment: Performed at [facility], [address],  [place], [place] [address]  Type and screen [facility]     Status: None (Preliminary result)   Collection Time: 11/22/23 12:55 AM  Result Value Ref Range   ABO/RH(D) O POS    Antibody Screen NEG    Sample Expiration      11/25/2023,2359 Performed at [facility], [address], [place], [place] [address]    Unit Number [id number]    Blood Component Type RED CELLS,LR    Unit division 00    Status of Unit ALLOCATED    Transfusion Status OK TO TRANSFUSE    Crossmatch Result Compatible   CBC     Status: Abnormal   Collection Time: 11/22/23  8:19 AM  Result Value Ref Range   WBC 17.7 (H) 4.0 - 10.5 K/uL   RBC 4.62 3.87 - 5.11 MIL/uL   Hemoglobin 9.1 (L) 12.0 - 15.0 g/dL   HCT 64.3 (L) 32.9 - 51.8 %   MCV 65.6 (L) 80.0 - 100.0 fL   MCH 19.7 (L) 26.0 - 34.0 pg   MCHC 30.0 30.0 - 36.0 g/dL   RDW 84.1 (H) 66.0 - 63.0 %   Platelets 376 150 - 400 K/uL    Comment: REPEATED TO VERIFY   nRBC 0.0 0.0 - 0.2 %    Comment: Performed at [facility], [address] [place], [place] [address]  Hepatitis B surface antigen     Status: None   Collection Time: 11/22/23  8:55 PM  Result Value Ref Range   Hepatitis B Surface Ag NON REACTIVE NON REACTIVE    Comment: Performed at [facility], [address] [place], [place] [address]  CBC with Differential/Platelet     Status: Abnormal   Collection Time: 11/23/23  6:31  AM  Result Value Ref Range   WBC 19.8 (H) 4.0 - 10.5 K/uL   RBC 4.16 3.87 - 5.11 MIL/uL   Hemoglobin 8.4 (L) 12.0 - 15.0 g/dL    Comment: Reticulocyte Hemoglobin testing may be clinically indicated, consider ordering this additional test [id number]    HCT 26.7 (L) 36.0 - 46.0 %   MCV 64.2 (L) 80.0 - 100.0 fL   MCH 20.2 (L) 26.0 - 34.0 pg   MCHC 31.5 30.0 - 36.0 g/dL   RDW 54.0 (H) 98.1 - 19.1 %   Platelets 343 150 - 400 K/uL    Comment: REPEATED TO VERIFY   nRBC 0.2 0.0 - 0.2 %   Neutrophils Relative % 81 %   Neutro Abs 16.0 (H) 1.7 - 7.7  K/uL   Lymphocytes Relative 11 %   Lymphs Abs 2.2 0.7 - 4.0 K/uL   Monocytes Relative 5 %   Monocytes Absolute 1.0 0.1 - 1.0 K/uL   Eosinophils Relative 3 %   Eosinophils Absolute 0.6 (H) 0.0 - 0.5 K/uL   Basophils Relative 0 %   Basophils Absolute 0.0 0.0 - 0.1 K/uL   WBC Morphology See Note     Comment: Morphology unremarkable   Smear Review See Note     Comment: Normal Platelet Morphology   nRBC 0 0 /100 WBC   Abs Immature Granulocytes 0.00 0.00 - 0.07 K/uL   Tear Drop Cells PRESENT    Polychromasia PRESENT    Target Cells PRESENT     Comment: Performed at [facility], [address] [place], [place] [address]  Renal function panel     Status: Abnormal   Collection Time: 11/23/23  6:31 AM  Result Value Ref Range   Sodium 133 (L) 135 - 145 mmol/L   Potassium 3.9 3.5 - 5.1 mmol/L   Chloride 98 98 - 111 mmol/L   CO2 24 22 - 32 mmol/L   Glucose, Bld 152 (H) 70 - 99 mg/dL    Comment: Glucose reference range applies only to samples taken after fasting for at least 8 hours.   BUN 10 6 - 20 mg/dL   Creatinine, Ser 5.62 (H) 0.44 - 1.00 mg/dL   Calcium  8.0 (L) 8.9 - 10.3 mg/dL   Phosphorus 1.2 (L) 2.5 - 4.6 mg/dL   Albumin  2.1 (L) 3.5 - 5.0 g/dL   GFR, Estimated 26 (L) >60 mL/min    Comment: (NOTE) Calculated using the CKD-EPI Creatinine Equation (2021)    Anion gap 11 5 - 15    Comment: Performed at [facility], [address] [place], [place] [address]    DG Foot Complete Left Result Date: 11/21/2023 CLINICAL DATA:  Infection.  Left foot gangrene. EXAM: LEFT FOOT - COMPLETE 3+ VIEW COMPARISON:  None Available. FINDINGS: The bones are diffusely under mineralized. Hammertoe deformity of the digits further limits assessment. Patchy subcutaneous gas in the hindfoot with the probable ulcer posterior to the calcaneus. Soft tissue gas is seen laterally the hindfoot and midfoot. There is no obvious bony destructive change, however the degree of bony under mineralization  limits assessment. Soft tissue ulcer may extend to bone involving the posterior calcaneus. No evidence of acute fracture. Generalized soft tissue edema with prominent vascular calcifications. Punctate densities in the plantar soft tissues may be calcifications or tiny radiopaque foreign bodies. IMPRESSION: 1. Probable soft tissue ulcer posterior to the calcaneus. Patchy soft tissue gas is seen in the plantar and lateral hindfoot and midfoot. 2.  No obvious bony destructive change, however the degree of bony under mineralization limits assessment. Soft tissue ulcer may extend to bone involving the posterior calcaneus. 3. Generalized soft tissue edema with prominent vascular calcifications. Punctate densities in the plantar soft tissues may be calcifications or tiny radiopaque foreign bodies. Electronically Signed   By: Chadwick Colonel M.D.   On: 11/21/2023 17:10    Review of Systems  Constitutional:  Negative for chills, diaphoresis and fever.  HENT:  Negative for ear discharge, ear pain, hearing loss and tinnitus.   Eyes:  Negative for photophobia and pain.  Respiratory:  Negative for cough and shortness of breath.   Cardiovascular:  Negative for chest pain.  Gastrointestinal:  Negative for abdominal pain, nausea and vomiting.  Genitourinary:  Negative for dysuria, flank pain, frequency and urgency.  Musculoskeletal:  Positive for arthralgias (Left foot). Negative for back pain, myalgias and neck pain.  Neurological:  Negative for dizziness and headaches.  Hematological:  Does not bruise/bleed easily.  Psychiatric/Behavioral:  The patient is not nervous/anxious.    Blood pressure (!) 147/61, pulse 66, temperature 98 F (36.7 C), resp. rate 17, height 5' (1.524 m), weight 63.8 kg, SpO2 100%. Physical Exam Constitutional:      General: She is not in acute distress.    Appearance: She is well-developed. She is not diaphoretic.  HENT:     Head: Normocephalic and atraumatic.  Eyes:     General:  No scleral icterus.       Right eye: No discharge.        Left eye: No discharge.     Conjunctiva/sclera: Conjunctivae normal.  Cardiovascular:     Rate and Rhythm: Normal rate and regular rhythm.  Pulmonary:     Effort: Pulmonary effort is normal. No respiratory distress.  Musculoskeletal:     Cervical back: Normal range of motion.  Feet:     Comments: LLE No traumatic wounds, ecchymosis, or rash  Large calc ulceration with malodor  No ankle effusion  Sens DPN, SPN absent, TN intact  Motor EHL, ext, flex, evers 1/5  DP 1+, PT 0, No significant edema Skin:    General: Skin is warm and dry.  Neurological:     Mental Status: She is alert.  Psychiatric:        Mood and Affect: Mood normal.        Behavior: Behavior normal.     Assessment/Plan: Left heel ulcer -- Will await results of MRI and ABI but will likely need BKA. She was not receptive to that when told. Dr. Julio Ohm to evaluate later today or in AM.    Georganna Kin, PA-C Orthopedic Surgery [phone] 11/23/2023, 10:20 AM

## 2023-11-24 ENCOUNTER — Encounter (HOSPITAL_COMMUNITY)

## 2023-11-24 DIAGNOSIS — L03116 Cellulitis of left lower limb: Secondary | ICD-10-CM | POA: Diagnosis not present

## 2023-11-24 DIAGNOSIS — M869 Osteomyelitis, unspecified: Secondary | ICD-10-CM | POA: Diagnosis not present

## 2023-11-24 DIAGNOSIS — N186 End stage renal disease: Secondary | ICD-10-CM | POA: Diagnosis not present

## 2023-11-24 DIAGNOSIS — E1149 Type 2 diabetes mellitus with other diabetic neurological complication: Secondary | ICD-10-CM | POA: Diagnosis not present

## 2023-11-24 LAB — CBC WITH DIFFERENTIAL/PLATELET
Abs Immature Granulocytes: 0.16 10*3/uL — ABNORMAL HIGH (ref 0.00–0.07)
Basophils Absolute: 0.1 10*3/uL (ref 0.0–0.1)
Basophils Relative: 0 %
Eosinophils Absolute: 0.3 10*3/uL (ref 0.0–0.5)
Eosinophils Relative: 1 %
HCT: 24.9 % — ABNORMAL LOW (ref 36.0–46.0)
Hemoglobin: 7.8 g/dL — ABNORMAL LOW (ref 12.0–15.0)
Immature Granulocytes: 1 %
Lymphocytes Relative: 14 %
Lymphs Abs: 2.7 10*3/uL (ref 0.7–4.0)
MCH: 20.3 pg — ABNORMAL LOW (ref 26.0–34.0)
MCHC: 31.3 g/dL (ref 30.0–36.0)
MCV: 64.7 fL — ABNORMAL LOW (ref 80.0–100.0)
Monocytes Absolute: 1.4 10*3/uL — ABNORMAL HIGH (ref 0.1–1.0)
Monocytes Relative: 7 %
Neutro Abs: 15.4 10*3/uL — ABNORMAL HIGH (ref 1.7–7.7)
Neutrophils Relative %: 77 %
Platelets: 356 10*3/uL (ref 150–400)
RBC: 3.85 MIL/uL — ABNORMAL LOW (ref 3.87–5.11)
RDW: 25.2 % — ABNORMAL HIGH (ref 11.5–15.5)
Smear Review: NORMAL
WBC: 20 10*3/uL — ABNORMAL HIGH (ref 4.0–10.5)
nRBC: 0 % (ref 0.0–0.2)

## 2023-11-24 LAB — COMPREHENSIVE METABOLIC PANEL WITH GFR
ALT: 9 U/L (ref 0–44)
AST: 15 U/L (ref 15–41)
Albumin: 2 g/dL — ABNORMAL LOW (ref 3.5–5.0)
Alkaline Phosphatase: 92 U/L (ref 38–126)
Anion gap: 12 (ref 5–15)
BUN: 20 mg/dL (ref 6–20)
CO2: 24 mmol/L (ref 22–32)
Calcium: 7.5 mg/dL — ABNORMAL LOW (ref 8.9–10.3)
Chloride: 97 mmol/L — ABNORMAL LOW (ref 98–111)
Creatinine, Ser: 3.1 mg/dL — ABNORMAL HIGH (ref 0.44–1.00)
GFR, Estimated: 17 mL/min — ABNORMAL LOW (ref >60)
Glucose, Bld: 208 mg/dL — ABNORMAL HIGH (ref 70–99)
Potassium: 4.1 mmol/L (ref 3.5–5.1)
Sodium: 133 mmol/L — ABNORMAL LOW (ref 135–145)
Total Bilirubin: 0.7 mg/dL (ref 0.0–1.2)
Total Protein: 6.3 g/dL — ABNORMAL LOW (ref 6.5–8.1)

## 2023-11-24 LAB — HEPATITIS B SURFACE ANTIBODY, QUANTITATIVE: Hep B S AB Quant (Post): <3.5 m[IU]/mL — ABNORMAL LOW

## 2023-11-24 MED ORDER — HYDROMORPHONE HCL 1 MG/ML IJ SOLN
INTRAMUSCULAR | Status: AC
  Filled 2023-11-24: qty 0.5

## 2023-11-24 MED ORDER — HEPARIN SODIUM (PORCINE) 1000 UNIT/ML IJ SOLN
INTRAMUSCULAR | Status: AC
  Filled 2023-11-24: qty 15

## 2023-11-24 NOTE — Consult Note (Signed)
 ORTHOPAEDIC CONSULTATION  REQUESTING PHYSICIAN: Modena Andes, MD  Chief Complaint: Gangrenous ulcer left heel.  HPI: [name] [name] is a 58 y.o. female who presents with several month history of gangrenous ulceration to the left heel.  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 she thinks one ovary was left   CATARACT EXTRACTION     CESAREAN SECTION     CHOLECYSTECTOMY     EYE SURGERY Left retinal   GANGLION CYST EXCISION     HIP PINNING,CANNULATED Right 10/02/2020   Procedure: CANNULATED HIP PINNING;  Surgeon: Hardy Lia, MD;  Location: [facility] OR;  Service: Orthopedics;  Laterality: Right;   INCISION AND DRAINAGE ABSCESS N/A 04/10/2021   Procedure: INCISION AND DRAINAGE ABSCESS LABIA;  Surgeon: Caralyn Chandler, MD;  Location: [facility] OR;  Service: General;  Laterality: N/A;   IR FLUORO GUIDE CV LINE LEFT  10/13/2020   IR PTA VENOUS EXCEPT DIALYSIS CIRCUIT  10/13/2020   ORIF TIBIA PLATEAU Left 10/09/2020   Procedure: OPEN REDUCTION INTERNAL FIXATION (ORIF) TIBIAL PLATEAU;  Surgeon: Hardy Lia, MD;  Location: [facility] OR;  Service: Orthopedics;  Laterality: Left;   TONSILLECTOMY     TUBAL LIGATION     Social History   Socioeconomic History   Marital status: Married    Spouse name: Not on file   Number of children: 3   Years of education: College   Highest education level: Not on file  Occupational History   Occupation: Disabled  Tobacco Use   Smoking  status: Never   Smokeless tobacco: Never  Substance and Sexual Activity   Alcohol use: No    Alcohol/week: 0.0 standard drinks of alcohol   Drug use: No   Sexual activity: Yes  Other Topics Concern   Not on file  Social History Narrative   Drinks about 1 cup of coffee in the morning.    Social Drivers of Corporate investment banker Strain: Not on file  Food Insecurity: No Food Insecurity (11/21/2023)   Hunger Vital Sign    Worried About Running Out of Food in the Last Year: Never true    Ran Out of Food in the Last Year: Never true  Transportation Needs: No Transportation Needs (11/21/2023)   PRAPARE - Administrator, Civil Service (Medical): No    Lack of Transportation (Non-Medical): No  Physical Activity: Not on file  Stress: Not on file  Social Connections: Unknown (11/15/2021)   [facility]   Social Network    Social Network: Not on file   Family History  Problem Relation Age of Onset   Diabetes Father    Heart attack Father    Aneurysm Mother    - negative except otherwise stated in the family history section Allergies  Allergen Reactions   Amitriptyline  Swelling and Other (See Comments)  Hypotension, Mental Status Changes, Confusion - "Allergic," per [facility]   Ciprofloxacin  Diarrhea, Nausea And Vomiting and Other (See Comments)    Severe vomiting requiring ED visit, IV reglan  and IV fluids- "Allergic," per [facility]   Ibuprofen  Itching   Aspirin  Itching and Other (See Comments)    Pt endorses getting itchy in the past with aspirin . Tolerated dose 04/06/22.   Semaglutide Nausea Only and Other (See Comments)    "Allergic," per [facility]   Tramadol  Itching   Tylenol  [Acetaminophen ] Itching and Other (See Comments)    "Allergic," per [facility]   Prior to Admission medications   Medication Sig Start Date End Date Taking? Authorizing Provider  benzocaine-menthol  (CHLORASEPTIC) 6-10 MG lozenge Take 1 lozenge by mouth every 6 (six) hours as  needed ("for cough").   Yes [provider]  cloNIDine  (CATAPRES  - DOSED IN MG/24 HR) 0.3 mg/24hr patch Place 0.3 mg onto the skin every Wednesday.   Yes [provider]  ELIQUIS 5 MG TABS tablet Take 5 mg by mouth 2 (two) times daily. 11/21/23  Yes [provider]  ferrous sulfate 325 (65 FE) MG EC tablet Take 325 mg by mouth in the morning and at bedtime.   Yes [provider]  fluticasone (FLONASE) 50 MCG/ACT nasal spray Place 2 sprays into both nostrils in the morning and at bedtime.   Yes [provider]  hydrALAZINE  (APRESOLINE ) 100 MG tablet Take 1 tablet (100 mg total) by mouth 3 (three) times daily. Patient taking differently: Take 100 mg by mouth See admin instructions. Take 100 mg by mouth at 6:30 AM on Sun/Mon/Wed/Fri (only) and at 2 PM and 8 PM (all days) 05/22/22  Yes Rai, Ripudeep K, MD  HYDROcodone -acetaminophen  (NORCO/VICODIN) 5-325 MG tablet Take 1 tablet by mouth every 6 (six) hours as needed (for pain- through 11/25/2023).   Yes [provider]  isosorbide  mononitrate (IMDUR) 30 MG 24 hr tablet Take 30 mg by mouth See admin instructions. Take 30 mg by mouth at 8 AM on Sun/Mon/Wed/Fri 11/16/23  Yes [provider]  lactulose (CHRONULAC) 10 GM/15ML solution Take 20 g by mouth in the morning and at bedtime.   Yes [provider]  lidocaine  (LIDODERM ) 5 % Place 1 patch onto the skin See admin instructions. Apply 1 new patch to bilateral feet at 8 AM daily- Remove & Discard patch within 12 hours or as directed by MD   Yes [provider]  metoprolol tartrate (LOPRESSOR) 50 MG tablet Take 50 mg by mouth See admin instructions. Take 50 mg by mouth at 8 AM on Sun/Mon/Wed/Fri (only) and at 9 PM (all nights). Hold for a Systolic number less than 110 or heart rate less than 60. 10/29/23  Yes [provider]  multivitamin (RENA-VIT) TABS tablet Take 1 tablet by mouth daily at 8 pm.   Yes [provider]  nystatin cream (MYCOSTATIN) Apply 1 Application topically See admin instructions. Apply to sacrum 2 times a day after cleansing ab=nd drying. Apply collagen particles after nystatin cream. 11/16/23  Yes [provider]  omeprazole  (PRILOSEC) 40 MG capsule Take 40 mg by mouth in the morning and at bedtime.   Yes [provider]  ondansetron  (ZOFRAN ) 4 MG tablet Take 1 tablet (4 mg total) by mouth every 8 (eight) hours as needed for nausea or vomiting. 05/22/22  Yes Rai, Ripudeep K, MD  pregabalin  (LYRICA ) 100 MG capsule Take 100 mg by mouth in the morning. 11/11/23  Yes [provider]  PROTEIN PO Take 30 mLs by mouth 2 (two) times daily.   Yes [provider]  amLODipine  (NORVASC ) 10 MG tablet Take 1 tablet (10 mg total) by mouth daily. Patient not taking: Reported on 11/22/2023 05/22/22   Rai, Hurman Maiden, MD  hydrOXYzine  (ATARAX ) 25 MG tablet Take 1 tablet (25 mg total) by mouth every 8 (eight) hours as needed for itching. Patient not taking: Reported on 11/22/2023 05/22/22   Rai, Hurman Maiden, MD  senna-docusate (SENOKOT-S) 8.6-50 MG tablet Take 2 tablets by mouth 2 (two) times daily. Patient not taking: Reported on 11/22/2023 10/16/20   Ardelia Kohut, MD   MR HEEL LEFT WO CONTRAST Result Date: 11/23/2023 CLINICAL DATA:  Diabetic foot swelling, tissue necrosis EXAM: MR OF THE LEFT HEEL WITHOUT CONTRAST TECHNIQUE: Multiplanar, multisequence MR imaging of the left heel was performed. No intravenous contrast was administered. COMPARISON:  Radiographs 11/21/2023 FINDINGS: Bones/Joint/Cartilage Abnormal edema posteriorly and medially in the calcaneus compatible with osteomyelitis given the overlying ulceration and abnormal gas in the soft tissues. Marrow edema in the talar neck and adjacent talar body potentially from stress fracture or osteomyelitis. Mild degenerative subcortical findings at the calcaneocuboid articulation. Ligaments Attenuated anterior talofibular  ligament, query prior tear. Muscles and Tendons Abnormal edema throughout the abductor hallucis muscle with some tenderness laxity along the myotendinous junction suspicious for tear. Regional muscular atrophy and low-grade edema probably neurogenic. Soft tissues Prominent plantar-lateral heel ulceration with gas tracking in the soft tissues and fluid infiltration as well extending below the calcaneus and superficial to the abductor digiti the minimi muscle in the subcutaneous tissues. Appearance compatible with necrotic tissue and gas-forming soft tissue infection. IMPRESSION: 1. Prominent plantar-lateral heel ulceration with gas tracking in the soft tissues and fluid infiltration as well extending below the calcaneus and superficial to the abductor digiti minimi muscle in the subcutaneous tissues. Appearance compatible with necrotic tissue and gas-forming soft tissue infection. 2. Abnormal edema posteriorly and medially in the calcaneus compatible with osteomyelitis. 3. Marrow edema in the talar neck and adjacent talar body potentially from stress fracture or osteomyelitis. 4. Abnormal edema throughout the abductor hallucis muscle with some laxity along the myotendinous junction suspicious for tear. Regional muscular atrophy and low-grade edema probably neurogenic. 5. Attenuated anterior talofibular ligament, query prior tear. Electronically Signed   By: Freida Jes M.D.   On: 11/23/2023 10:30   MR FOOT LEFT WO CONTRAST Result Date: 11/23/2023 CLINICAL DATA:  Chronic heel/foot pain. Gas in the plantar soft tissues on radiographs 11/21/2023 EXAM: MRI OF THE LEFT FOOT WITHOUT CONTRAST TECHNIQUE: Multiplanar, multisequence MR imaging of the left forefoot was performed. No intravenous contrast was administered. COMPARISON:  11/21/2023 radiographs FINDINGS: Bones/Joint/Cartilage Arthropathy of the proximal interphalangeal joint of the second toe with potential resorption of the head of the proximal phalanx,  hyperflexion of the PIP joint, and edema signal especially in the distal half of the proximal phalanx and also on the base of the middle phalanx. Strictly speaking osteomyelitis is not excluded. No obvious joint effusion. Stress fracture of the proximal metaphysis of the first metatarsal with associated low-grade edema throughout the shaft of the first metatarsal. The stress fractures primarily visible laterally as on image 15 series 12. Marrow edema distally in the lateral cuneiform as on image 16 series 12, compatible with stress injury. Partially visualized findings in the hindfoot are reserved for the ankle MRI dictation. Ligaments Lisfranc ligament intact. Muscles and Tendons Abnormal muscular edema in the abductor hallucis muscle on 9 series 11 with  some laxity along the myotendinous junction suspicious for muscle tear. Low-level edema within along remaining musculature of the forefoot likely neurogenic. Atrophic extensor digitorum brevis muscle. Soft tissues Loculations of gas an infiltrative fluid track along the plantar-lateral margin of the abductor digiti minimi muscle. Subcutaneous edema tracks along the plantar foot and to a lesser extent along the dorsal foot. IMPRESSION: 1. Loculations of gas and infiltrative fluid track along the plantar-lateral margin of the abductor digiti minimi muscle. This is compatible with soft tissue infection and gas-forming organism. 2. Abnormal muscular edema in the abductor hallucis muscle with some laxity along the myotendinous junction suspicious for muscle tear. 3. Stress fracture of the proximal metaphysis of the first metatarsal with associated low-grade edema throughout the shaft of the first metatarsal. 4. Marrow edema distally in the lateral cuneiform, compatible with stress injury. 5. Arthropathy of the proximal interphalangeal joint of the second toe with potential resorption of the head of the proximal phalanx, hyperflexion of the PIP joint, and edema signal  especially in the distal half of the proximal phalanx and also on the base of the middle phalanx. Strictly speaking osteomyelitis is not excluded. 6. Subcutaneous edema tracks along the plantar foot and to a lesser extent along the dorsal foot. 7. Partially visualized findings in the hindfoot are reserved for the ankle MRI dictation. Electronically Signed   By: Freida Jes M.D.   On: 11/23/2023 10:25   - pertinent xrays, CT, MRI studies were reviewed and independently interpreted  Positive ROS: All other systems have been reviewed and were otherwise negative with the exception of those mentioned in the HPI and as above.  Physical Exam: General: Alert, no acute distress Psychiatric: Patient is competent for consent with normal mood and affect Lymphatic: No axillary or cervical lymphadenopathy Cardiovascular: No pedal edema Respiratory: No cyanosis, no use of accessory musculature GI: No organomegaly, abdomen is soft and non-tender    Images:  @ENCIMAGES @  Labs:  Lab Results  Component Value Date   HGBA1C 7.1 (H) 11/22/2023   HGBA1C 6.1 (H) 04/11/2021   HGBA1C 6.5 (H) 10/02/2020   ESRSEDRATE 77 (H) 10/23/2009   ESRSEDRATE 80 (H) 10/21/2009   ESRSEDRATE 8 05/30/2007   REPTSTATUS PENDING 11/22/2023   GRAMSTAIN  04/10/2021    ABUNDANT WBC PRESENT,BOTH PMN AND MONONUCLEAR ABUNDANT GRAM POSITIVE COCCI MODERATE GRAM NEGATIVE RODS    CULT  11/22/2023    NO GROWTH 2 DAYS Performed at [facility], [address], [place], [place] [address]    LABORGA PROTEUS MIRABILIS (A) 05/19/2022   LABORGA KLEBSIELLA PNEUMONIAE (A) 05/19/2022    Lab Results  Component Value Date   ALBUMIN  2.1 (L) 11/23/2023   ALBUMIN  2.5 (L) 11/22/2023   ALBUMIN  2.5 (L) 11/21/2023        Latest Ref Rng & Units 11/23/2023    6:31 AM 11/22/2023    8:19 AM 11/22/2023   12:49 AM  CBC EXTENDED  WBC 4.0 - 10.5 K/uL 19.8  17.7    RBC 3.87 - 5.11 MIL/uL 4.16  4.62    Hemoglobin 12.0 - 15.0 g/dL  8.4  9.1  8.4   HCT 60.4 - 46.0 % 26.7  30.3  28.2   Platelets 150 - 400 K/uL 343  376    NEUT# 1.7 - 7.7 K/uL 16.0     Lymph# 0.7 - 4.0 K/uL 2.2       Neurologic: Patient does not have protective sensation bilateral lower extremities.   MUSCULOSKELETAL:   Skin:  Examination there is black gangrenous changes of the entire heel pad.  Review of the MRI scan shows extensive air in the soft tissue involving the hindfoot extending up to the midfoot with osteomyelitis of the calcaneus.  There is edema in the talus and [place] radiographs appear that this is consistent with a stress fracture through the talar neck.  Patient has no active plantarflexion or dorsiflexion of the left ankle no active range of motion of her toes.  Patient states she is nonambulatory.  White cell count 19.8 with a hemoglobin of 8.4.  Albumin  2.1.  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  Plan: Plan: Recommended proceeding with a below-knee amputation.  Patient states she will not consider surgery.  Patient also refused her insulin  this morning.  Recommend hospice care.  Thank you for the consult and the opportunity to see Ms. Dondra Fuel, MD [facility] [phone] 7:50 AM

## 2023-11-24 NOTE — Plan of Care (Signed)

## 2023-11-24 NOTE — Progress Notes (Signed)
 PROGRESS NOTE    [name] [name]  GUY:[id number] DOB: [birth year] DOA: 11/21/2023 PCP: Alwyn Juba., MD   Brief Narrative:  The patient 58 year old African-American female with a past medical history significant for ESRD on hemodialysis Tuesday Thursday Saturdays, GERD, essential hypertension, left eye blindness, diabetes mellitus type 2 which is fairly well-controlled, history of lumbar radiculopathy presented from her SNF from [facility] with a left heel wound and cellulitis and suspicion for possible and gas-forming infection. She has been undergoing wound care treatments but states that the foot has gotten worse. Workup in the ED shows a wound infection and cellulitis but there is concern for deeper infection so we will obtain an MRI of the heel and foot and obtain vascular ABIs. On presentation she is found to have a hemoglobin of 6.1 with no obvious source of bleeding so she is typed and screened and transfused 1 unit PRBCs. Nephrology is consulted for maintenance of hemodialysis and pending further workup may need to consult vascular surgery and orthopedic surgery for further evaluation   Assessment & Plan:   Principal Problem:   Cellulitis of left lower extremity Active Problems:   Diabetic neuropathy (HCC)   Essential hypertension   GERD (gastroesophageal reflux disease)   ESRD (end stage renal disease) (HCC)   DM2 (diabetes mellitus, type 2) (HCC)   Anemia due to chronic kidney disease   Osteomyelitis of left foot (HCC)  Infected Diabetic Foot ulcer with Cellulitis/gangrene and gas-forming infection of the left heel/osteomyelitis: The unstageable pressure heel left ulcer with a wound bed black eschar.  States that she sees the wound clinic but is not getting better.  she received Unasyn  and linezolid  in the ED and was later started on cefepime  and vancomycin .  Blood cultures x 2 were obtained and NGTD.  MRI confirms osteomyelitis.  Ortho consulted, patient seen by Dr. Julio Ohm who  recommended BKA however patient refused surgery.  I personally had a lengthy discussion with the patient and discussed in length about advantages and disadvantages of the surgery and after long discussion, patient has agreed to move on with BKA.  I have informed Dr. Julio Ohm.  We will continue antibiotics in the meantime.  ABI still pending.   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 C/w Sensitive Novolog  SSI AC/HS. Has been refusing her Insulin  regimen.    HypoNa+: Mild, asymptomatic, improving.   Essential HTN: Blood pressure fairly controlled.  C/w Amlodipine  10 mg po Daily and Metoprolol Tartrate 50 mg po BID.    ESRD on HD TTSat: Nephrology following.  Appreciate their help with management of this.   Symptomatic Anemia/Microcytic Anemia/Anemia of Chronic Kidney Disease: Globin 6.1 upon arrival, status post 1 unit of PRBC transfusion, hemoglobin improved to 8.4 but dropped to 7.8 today.  Monitor daily and transfuse if less than 7.  Thrombocytosis: Improved.  DVT prophylaxis: SCDs Start: 11/21/23 2128   Code Status: Full Code  Family Communication:  None present at bedside.  Plan of care discussed with patient in length and he/she verbalized understanding and agreed with it.  Status is: Inpatient Remains inpatient appropriate because: Needs BKA.   Estimated body mass index is 27.47 kg/m as calculated from the following:   Height as of this encounter: 5' (1.524 m).   Weight as of this encounter: 63.8 kg.    Nutritional Assessment: Body mass index is 27.47 kg/m.Aaron Aas Seen by dietician.  I agree with the assessment and plan as outlined below: Nutrition Status:        .  Skin Assessment: I have examined the patient's skin and I agree with the wound assessment as performed by the wound care RN as outlined below:    Consultants:  Podiatry  Procedures:  None  Antimicrobials:  Anti-infectives (From admission, onward)    Start      Dose/Rate Route Frequency Ordered Stop   11/24/23 1200  vancomycin  (VANCOREADY) IVPB 750 mg/150 mL        750 mg 150 mL/hr over 60 Minutes Intravenous Every T-Th-Sa (Hemodialysis) 11/22/23 1437 11/26/23 1159   11/23/23 1330  vancomycin  (VANCOREADY) IVPB 750 mg/150 mL        750 mg 150 mL/hr over 60 Minutes Intravenous  Once 11/23/23 1243 11/23/23 1529   11/22/23 0600  Vancomycin  (VANCOCIN ) 1,250 mg in sodium chloride  0.9 % 250 mL IVPB        1,250 mg 166.7 mL/hr over 90 Minutes Intravenous  Once 11/21/23 2122 11/22/23 0714   11/22/23 0200  piperacillin -tazobactam (ZOSYN ) IVPB 2.25 g  Status:  Discontinued        2.25 g 100 mL/hr over 30 Minutes Intravenous Every 8 hours 11/21/23 2122 11/21/23 2126   11/21/23 2230  ceFEPIme  (MAXIPIME ) 1 g in sodium chloride  0.9 % 100 mL IVPB        1 g 200 mL/hr over 30 Minutes Intravenous Every 24 hours 11/21/23 2126     11/21/23 2120  vancomycin  variable dose per unstable renal function (pharmacist dosing)         Does not apply See admin instructions 11/21/23 2122     11/21/23 1600  Ampicillin -Sulbactam (UNASYN ) 3 g in sodium chloride  0.9 % 100 mL IVPB       Placed in "And" Linked Group   3 g 200 mL/hr over 30 Minutes Intravenous  Once 11/21/23 1547 11/21/23 1740   11/21/23 1600  linezolid  (ZYVOX ) IVPB 600 mg       Placed in "And" Linked Group   600 mg 300 mL/hr over 60 Minutes Intravenous  Once 11/21/23 1547 11/21/23 1808         Subjective: Patient seen examined.  She was awake today and fully alert and oriented.  She remembers talking to Dr. Julio Ohm about a BKA.  She was sad to learn.  After long discussion, she said, although he this is not what she wants but she understands that she does not have any other better option and she has agreed to proceed with BKA.  Objective: Vitals:   11/23/23 2017 11/23/23 2109 11/24/23 0441 11/24/23 0553  BP: (!) 126/56 (!) 126/56 (!) 144/59 (!) 144/59  Pulse: 61 61 61   Resp: 17  17   Temp: 99.1 F (37.3  C)  99.6 F (37.6 C)   TempSrc: Oral  Oral   SpO2: 96%  96%   Weight:      Height:        Intake/Output Summary (Last 24 hours) at 11/24/2023 1220 Last data filed at 11/24/2023 0729 Gross per 24 hour  Intake 890.58 ml  Output --  Net 890.58 ml   Filed Weights   11/22/23 0510 11/22/23 2130 11/23/23 0216  Weight: 65.1 kg 65.1 kg 63.8 kg    Examination:  General exam: Appears calm and comfortable  Respiratory system: Clear to auscultation. Respiratory effort normal. Cardiovascular system: S1 & S2 heard, RRR. No JVD, murmurs, rubs, gallops or clicks. No pedal edema. Gastrointestinal system: Abdomen is nondistended, soft and nontender. No organomegaly or masses felt. Normal bowel sounds heard. Central nervous system:  Sleepy but oriented. No focal neurological deficits. Extremities: Symmetric 5 x 5 power. Skin: Wet gangrene of the left heel with foul-smelling mild purulent discharge as well as erythema around the wound.  Data Reviewed: I have personally reviewed following labs and imaging studies  CBC: Recent Labs  Lab 11/21/23 1641 11/22/23 0049 11/22/23 0819 11/23/23 0631 11/24/23 0624  WBC 18.8*  --  17.7* 19.8* 20.0*  NEUTROABS 14.3*  --   --  16.0* 15.4*  HGB 6.1* 8.4* 9.1* 8.4* 7.8*  HCT 20.9* 28.2* 30.3* 26.7* 24.9*  MCV 63.3*  --  65.6* 64.2* 64.7*  PLT 422*  --  376 343 356   Basic Metabolic Panel: Recent Labs  Lab 11/21/23 1641 11/22/23 0049 11/23/23 0631 11/24/23 0624  NA 133* 132* 133* 133*  K 3.6 3.6 3.9 4.1  CL 96* 95* 98 97*  CO2 27 25 24 24   GLUCOSE 260* 311* 152* 208*  BUN 22* 23* 10 20  CREATININE 3.85* 3.97* 2.14* 3.10*  CALCIUM  7.8* 8.0* 8.0* 7.5*  PHOS  --  2.1* 1.2*  --    GFR: Estimated Creatinine Clearance: 16.7 mL/min (A) (by C-G formula based on SCr of 3.1 mg/dL (H)). Liver Function Tests: Recent Labs  Lab 11/21/23 1641 11/22/23 0049 11/23/23 0631 11/24/23 0624  AST 14*  --   --  15  ALT 7  --   --  9  ALKPHOS 116  --   --   92  BILITOT 0.6  --   --  0.7  PROT 6.6  --   --  6.3*  ALBUMIN  2.5* 2.5* 2.1* 2.0*   No results for input(s): "LIPASE", "AMYLASE" in the last 168 hours. No results for input(s): "AMMONIA" in the last 168 hours. Coagulation Profile: No results for input(s): "INR", "PROTIME" in the last 168 hours. Cardiac Enzymes: No results for input(s): "CKTOTAL", "CKMB", "CKMBINDEX", "TROPONINI" in the last 168 hours. BNP (last 3 results) No results for input(s): "PROBNP" in the last 8760 hours. HbA1C: Recent Labs    11/22/23 0049  HGBA1C 7.1*   CBG: No results for input(s): "GLUCAP" in the last 168 hours. Lipid Profile: No results for input(s): "CHOL", "HDL", "LDLCALC", "TRIG", "CHOLHDL", "LDLDIRECT" in the last 72 hours. Thyroid  Function Tests: No results for input(s): "TSH", "T4TOTAL", "FREET4", "T3FREE", "THYROIDAB" in the last 72 hours. Anemia Panel: No results for input(s): "VITAMINB12", "FOLATE", "FERRITIN", "TIBC", "IRON ", "RETICCTPCT" in the last 72 hours. Sepsis Labs: Recent Labs  Lab 11/21/23 1647  LATICACIDVEN 0.9    Recent Results (from the past 240 hours)  Blood Cultures x 2 sites     Status: None (Preliminary result)   Collection Time: 11/21/23  4:41 PM   Specimen: BLOOD LEFT ARM  Result Value Ref Range Status   Specimen Description   Final    BLOOD LEFT ARM Performed at [facility], [address], [place], [place] [address]    Special Requests   Final    BOTTLES DRAWN AEROBIC AND ANAEROBIC Blood Culture results may not be optimal due to an inadequate volume of blood received in culture bottles Performed at [facility], [address], [place], [place] [address]    Culture   Final    NO GROWTH 3 DAYS Performed at [facility], [address] [place], [place] [address]    Report Status PENDING  Incomplete  Culture, blood (Routine X 2) w Reflex to ID Panel     Status: None (Preliminary result)  Collection Time: 11/22/23 12:48 AM    Specimen: BLOOD  Result Value Ref Range Status   Specimen Description BLOOD RIGHT ANTECUBITAL  Final   Special Requests   Final    BOTTLES DRAWN AEROBIC AND ANAEROBIC Blood Culture adequate volume   Culture   Final    NO GROWTH 2 DAYS Performed at [facility], [address] [place], [place] [address]    Report Status PENDING  Incomplete     Radiology Studies: MR HEEL LEFT WO CONTRAST Result Date: 11/23/2023 CLINICAL DATA:  Diabetic foot swelling, tissue necrosis EXAM: MR OF THE LEFT HEEL WITHOUT CONTRAST TECHNIQUE: Multiplanar, multisequence MR imaging of the left heel was performed. No intravenous contrast was administered. COMPARISON:  Radiographs 11/21/2023 FINDINGS: Bones/Joint/Cartilage Abnormal edema posteriorly and medially in the calcaneus compatible with osteomyelitis given the overlying ulceration and abnormal gas in the soft tissues. Marrow edema in the talar neck and adjacent talar body potentially from stress fracture or osteomyelitis. Mild degenerative subcortical findings at the calcaneocuboid articulation. Ligaments Attenuated anterior talofibular ligament, query prior tear. Muscles and Tendons Abnormal edema throughout the abductor hallucis muscle with some tenderness laxity along the myotendinous junction suspicious for tear. Regional muscular atrophy and low-grade edema probably neurogenic. Soft tissues Prominent plantar-lateral heel ulceration with gas tracking in the soft tissues and fluid infiltration as well extending below the calcaneus and superficial to the abductor digiti the minimi muscle in the subcutaneous tissues. Appearance compatible with necrotic tissue and gas-forming soft tissue infection. IMPRESSION: 1. Prominent plantar-lateral heel ulceration with gas tracking in the soft tissues and fluid infiltration as well extending below the calcaneus and superficial to the abductor digiti minimi muscle in the subcutaneous tissues. Appearance compatible with  necrotic tissue and gas-forming soft tissue infection. 2. Abnormal edema posteriorly and medially in the calcaneus compatible with osteomyelitis. 3. Marrow edema in the talar neck and adjacent talar body potentially from stress fracture or osteomyelitis. 4. Abnormal edema throughout the abductor hallucis muscle with some laxity along the myotendinous junction suspicious for tear. Regional muscular atrophy and low-grade edema probably neurogenic. 5. Attenuated anterior talofibular ligament, query prior tear. Electronically Signed   By: Freida Jes M.D.   On: 11/23/2023 10:30   MR FOOT LEFT WO CONTRAST Result Date: 11/23/2023 CLINICAL DATA:  Chronic heel/foot pain. Gas in the plantar soft tissues on radiographs 11/21/2023 EXAM: MRI OF THE LEFT FOOT WITHOUT CONTRAST TECHNIQUE: Multiplanar, multisequence MR imaging of the left forefoot was performed. No intravenous contrast was administered. COMPARISON:  11/21/2023 radiographs FINDINGS: Bones/Joint/Cartilage Arthropathy of the proximal interphalangeal joint of the second toe with potential resorption of the head of the proximal phalanx, hyperflexion of the PIP joint, and edema signal especially in the distal half of the proximal phalanx and also on the base of the middle phalanx. Strictly speaking osteomyelitis is not excluded. No obvious joint effusion. Stress fracture of the proximal metaphysis of the first metatarsal with associated low-grade edema throughout the shaft of the first metatarsal. The stress fractures primarily visible laterally as on image 15 series 12. Marrow edema distally in the lateral cuneiform as on image 16 series 12, compatible with stress injury. Partially visualized findings in the hindfoot are reserved for the ankle MRI dictation. Ligaments Lisfranc ligament intact. Muscles and Tendons Abnormal muscular edema in the abductor hallucis muscle on 9 series 11 with some laxity along the myotendinous junction suspicious for muscle tear.  Low-level edema within along remaining musculature of the forefoot likely neurogenic. Atrophic extensor digitorum brevis  muscle. Soft tissues Loculations of gas an infiltrative fluid track along the plantar-lateral margin of the abductor digiti minimi muscle. Subcutaneous edema tracks along the plantar foot and to a lesser extent along the dorsal foot. IMPRESSION: 1. Loculations of gas and infiltrative fluid track along the plantar-lateral margin of the abductor digiti minimi muscle. This is compatible with soft tissue infection and gas-forming organism. 2. Abnormal muscular edema in the abductor hallucis muscle with some laxity along the myotendinous junction suspicious for muscle tear. 3. Stress fracture of the proximal metaphysis of the first metatarsal with associated low-grade edema throughout the shaft of the first metatarsal. 4. Marrow edema distally in the lateral cuneiform, compatible with stress injury. 5. Arthropathy of the proximal interphalangeal joint of the second toe with potential resorption of the head of the proximal phalanx, hyperflexion of the PIP joint, and edema signal especially in the distal half of the proximal phalanx and also on the base of the middle phalanx. Strictly speaking osteomyelitis is not excluded. 6. Subcutaneous edema tracks along the plantar foot and to a lesser extent along the dorsal foot. 7. Partially visualized findings in the hindfoot are reserved for the ankle MRI dictation. Electronically Signed   By: Freida Jes M.D.   On: 11/23/2023 10:25    Scheduled Meds:  sodium chloride    Intravenous Once   amLODipine   10 mg Oral Daily   calcitRIOL  0.25 mcg Oral Q T,Th,Sa-HD   calcium  carbonate  1 tablet Oral TID WC   Chlorhexidine  Gluconate Cloth  6 each Topical Q0600   Chlorhexidine  Gluconate Cloth  6 each Topical Q0600   Chlorhexidine  Gluconate Cloth  6 each Topical Q0600   cloNIDine   0.1 mg Transdermal Weekly   hydrALAZINE   100 mg Oral Q8H   insulin   aspart  0-5 Units Subcutaneous QHS   insulin  aspart  0-6 Units Subcutaneous TID WC   metoprolol tartrate  50 mg Oral BID   vancomycin  variable dose per unstable renal function (pharmacist dosing)   Does not apply See admin instructions   Continuous Infusions:  ceFEPime  (MAXIPIME ) IV 1 g (11/23/23 2108)   vancomycin  750 mg (11/24/23 1153)     LOS: 3 days   Modena Andes, MD [facility]  11/24/2023, 12:20 PM   *Please note that this is a verbal dictation therefore any spelling or grammatical errors are due to the "[employer]agon [employer] One" system interpretation.  Please page via Amion and do not message via secure chat for urgent patient care matters. Secure chat can be used for non urgent patient care matters.  How to contact the [facility] Attending or Consulting provider 7A - 7P or covering provider during after hours 7P -7A, for this patient?  Check the care team in [facility] and look for a) attending/consulting [facility] provider listed and b) the [facility] team listed. Page or secure chat 7A-7P. Log into [email or web address] and use [place]'s universal password to access. If you do not have the password, please contact the hospital operator. Locate the [facility] provider you are looking for under [facility] and page to a number that you can be directly reached. If you still have difficulty reaching the provider, please page the [facility] (Director on Call) for the Hospitalists listed on amion for assistance.

## 2023-11-24 NOTE — Care Management Important Message (Signed)
 Important Message  Patient Details  Name: [name] [name] MRN: [id number] Date of Birth: [birth year]   Important Message Given:  Yes - Medicare IM     Felix Host 11/24/2023, 12:49 PM

## 2023-11-24 NOTE — Progress Notes (Signed)
 Received patient in bed to unit. Bay 7 Alert and oriented. A& O x4 Informed consent signed and in chart. Yes  TX duration: 3 hours  Patient tolerated well. Yes, very anxious about how much time is left  Transported back to the room via bed Alert, without acute distress. none Hand-off given to patient's nurse.  PatriciaC. RN Access used: Left AVG  Access issues: None  Total UF removed: 1400 Medication(s) given: Dilaudid  Post HD weight: 62.6 KG Post HD VS: 128/67, MAP 86,100 on RA, 15, Resp, 75 P   Tahra Hitzeman S Icholas Irby RN, DNP [facility]

## 2023-11-24 NOTE — H&P (View-Only) (Signed)
 ORTHOPAEDIC CONSULTATION  REQUESTING PHYSICIAN: Modena Andes, MD  Chief Complaint: Gangrenous ulcer left heel.  HPI: [name] [name] is a 58 y.o. female who presents with several month history of gangrenous ulceration to the left heel.  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 she thinks one ovary was left   CATARACT EXTRACTION     CESAREAN SECTION     CHOLECYSTECTOMY     EYE SURGERY Left retinal   GANGLION CYST EXCISION     HIP PINNING,CANNULATED Right 10/02/2020   Procedure: CANNULATED HIP PINNING;  Surgeon: Hardy Lia, MD;  Location: [facility] OR;  Service: Orthopedics;  Laterality: Right;   INCISION AND DRAINAGE ABSCESS N/A 04/10/2021   Procedure: INCISION AND DRAINAGE ABSCESS LABIA;  Surgeon: Caralyn Chandler, MD;  Location: [facility] OR;  Service: General;  Laterality: N/A;   IR FLUORO GUIDE CV LINE LEFT  10/13/2020   IR PTA VENOUS EXCEPT DIALYSIS CIRCUIT  10/13/2020   ORIF TIBIA PLATEAU Left 10/09/2020   Procedure: OPEN REDUCTION INTERNAL FIXATION (ORIF) TIBIAL PLATEAU;  Surgeon: Hardy Lia, MD;  Location: [facility] OR;  Service: Orthopedics;  Laterality: Left;   TONSILLECTOMY     TUBAL LIGATION     Social History   Socioeconomic History   Marital status: Married    Spouse name: Not on file   Number of children: 3   Years of education: College   Highest education level: Not on file  Occupational History   Occupation: Disabled  Tobacco Use   Smoking  status: Never   Smokeless tobacco: Never  Substance and Sexual Activity   Alcohol use: No    Alcohol/week: 0.0 standard drinks of alcohol   Drug use: No   Sexual activity: Yes  Other Topics Concern   Not on file  Social History Narrative   Drinks about 1 cup of coffee in the morning.    Social Drivers of Corporate investment banker Strain: Not on file  Food Insecurity: No Food Insecurity (11/21/2023)   Hunger Vital Sign    Worried About Running Out of Food in the Last Year: Never true    Ran Out of Food in the Last Year: Never true  Transportation Needs: No Transportation Needs (11/21/2023)   PRAPARE - Administrator, Civil Service (Medical): No    Lack of Transportation (Non-Medical): No  Physical Activity: Not on file  Stress: Not on file  Social Connections: Unknown (11/15/2021)   Received from [facility]   Social Network    Social Network: Not on file   Family History  Problem Relation Age of Onset   Diabetes Father    Heart attack Father    Aneurysm Mother    - negative except otherwise stated in the family history section Allergies  Allergen Reactions   Amitriptyline  Swelling and Other (See Comments)  Hypotension, Mental Status Changes, Confusion - "Allergic," per [facility]   Ciprofloxacin  Diarrhea, Nausea And Vomiting and Other (See Comments)    Severe vomiting requiring ED visit, IV reglan  and IV fluids- "Allergic," per [facility]   Ibuprofen  Itching   Aspirin  Itching and Other (See Comments)    Pt endorses getting itchy in the past with aspirin . Tolerated dose 04/06/22.   Semaglutide Nausea Only and Other (See Comments)    "Allergic," per [facility]   Tramadol  Itching   Tylenol  [Acetaminophen ] Itching and Other (See Comments)    "Allergic," per [facility]   Prior to Admission medications   Medication Sig Start Date End Date Taking? Authorizing Provider  benzocaine-menthol  (CHLORASEPTIC) 6-10 MG lozenge Take 1 lozenge by mouth every 6 (six) hours as  needed ("for cough").   Yes [provider]  cloNIDine  (CATAPRES  - DOSED IN MG/24 HR) 0.3 mg/24hr patch Place 0.3 mg onto the skin every Wednesday.   Yes [provider]  ELIQUIS 5 MG TABS tablet Take 5 mg by mouth 2 (two) times daily. 11/21/23  Yes [provider]  ferrous sulfate 325 (65 FE) MG EC tablet Take 325 mg by mouth in the morning and at bedtime.   Yes [provider]  fluticasone (FLONASE) 50 MCG/ACT nasal spray Place 2 sprays into both nostrils in the morning and at bedtime.   Yes [provider]  hydrALAZINE  (APRESOLINE ) 100 MG tablet Take 1 tablet (100 mg total) by mouth 3 (three) times daily. Patient taking differently: Take 100 mg by mouth See admin instructions. Take 100 mg by mouth at 6:30 AM on Sun/Mon/Wed/Fri (only) and at 2 PM and 8 PM (all days) 05/22/22  Yes Rai, Ripudeep K, MD  HYDROcodone -acetaminophen  (NORCO/VICODIN) 5-325 MG tablet Take 1 tablet by mouth every 6 (six) hours as needed (for pain- through 11/25/2023).   Yes [provider]  isosorbide  mononitrate (IMDUR) 30 MG 24 hr tablet Take 30 mg by mouth See admin instructions. Take 30 mg by mouth at 8 AM on Sun/Mon/Wed/Fri 11/16/23  Yes [provider]  lactulose (CHRONULAC) 10 GM/15ML solution Take 20 g by mouth in the morning and at bedtime.   Yes [provider]  lidocaine  (LIDODERM ) 5 % Place 1 patch onto the skin See admin instructions. Apply 1 new patch to bilateral feet at 8 AM daily- Remove & Discard patch within 12 hours or as directed by MD   Yes [provider]  metoprolol tartrate (LOPRESSOR) 50 MG tablet Take 50 mg by mouth See admin instructions. Take 50 mg by mouth at 8 AM on Sun/Mon/Wed/Fri (only) and at 9 PM (all nights). Hold for a Systolic number less than 110 or heart rate less than 60. 10/29/23  Yes [provider]  multivitamin (RENA-VIT) TABS tablet Take 1 tablet by mouth daily at 8 pm.   Yes [provider]  nystatin cream (MYCOSTATIN) Apply 1 Application topically See admin instructions. Apply to sacrum 2 times a day after cleansing ab=nd drying. Apply collagen particles after nystatin cream. 11/16/23  Yes [provider]  omeprazole  (PRILOSEC) 40 MG capsule Take 40 mg by mouth in the morning and at bedtime.   Yes [provider]  ondansetron  (ZOFRAN ) 4 MG tablet Take 1 tablet (4 mg total) by mouth every 8 (eight) hours as needed for nausea or vomiting. 05/22/22  Yes Rai, Ripudeep K, MD  pregabalin  (LYRICA ) 100 MG capsule Take 100 mg by mouth in the morning. 11/11/23  Yes [provider]  PROTEIN PO Take 30 mLs by mouth 2 (two) times daily.   Yes [provider]  amLODipine  (NORVASC ) 10 MG tablet Take 1 tablet (10 mg total) by mouth daily. Patient not taking: Reported on 11/22/2023 05/22/22   Rai, Hurman Maiden, MD  hydrOXYzine  (ATARAX ) 25 MG tablet Take 1 tablet (25 mg total) by mouth every 8 (eight) hours as needed for itching. Patient not taking: Reported on 11/22/2023 05/22/22   Rai, Hurman Maiden, MD  senna-docusate (SENOKOT-S) 8.6-50 MG tablet Take 2 tablets by mouth 2 (two) times daily. Patient not taking: Reported on 11/22/2023 10/16/20   Ardelia Kohut, MD   MR HEEL LEFT WO CONTRAST Result Date: 11/23/2023 CLINICAL DATA:  Diabetic foot swelling, tissue necrosis EXAM: MR OF THE LEFT HEEL WITHOUT CONTRAST TECHNIQUE: Multiplanar, multisequence MR imaging of the left heel was performed. No intravenous contrast was administered. COMPARISON:  Radiographs 11/21/2023 FINDINGS: Bones/Joint/Cartilage Abnormal edema posteriorly and medially in the calcaneus compatible with osteomyelitis given the overlying ulceration and abnormal gas in the soft tissues. Marrow edema in the talar neck and adjacent talar body potentially from stress fracture or osteomyelitis. Mild degenerative subcortical findings at the calcaneocuboid articulation. Ligaments Attenuated anterior talofibular  ligament, query prior tear. Muscles and Tendons Abnormal edema throughout the abductor hallucis muscle with some tenderness laxity along the myotendinous junction suspicious for tear. Regional muscular atrophy and low-grade edema probably neurogenic. Soft tissues Prominent plantar-lateral heel ulceration with gas tracking in the soft tissues and fluid infiltration as well extending below the calcaneus and superficial to the abductor digiti the minimi muscle in the subcutaneous tissues. Appearance compatible with necrotic tissue and gas-forming soft tissue infection. IMPRESSION: 1. Prominent plantar-lateral heel ulceration with gas tracking in the soft tissues and fluid infiltration as well extending below the calcaneus and superficial to the abductor digiti minimi muscle in the subcutaneous tissues. Appearance compatible with necrotic tissue and gas-forming soft tissue infection. 2. Abnormal edema posteriorly and medially in the calcaneus compatible with osteomyelitis. 3. Marrow edema in the talar neck and adjacent talar body potentially from stress fracture or osteomyelitis. 4. Abnormal edema throughout the abductor hallucis muscle with some laxity along the myotendinous junction suspicious for tear. Regional muscular atrophy and low-grade edema probably neurogenic. 5. Attenuated anterior talofibular ligament, query prior tear. Electronically Signed   By: Freida Jes M.D.   On: 11/23/2023 10:30   MR FOOT LEFT WO CONTRAST Result Date: 11/23/2023 CLINICAL DATA:  Chronic heel/foot pain. Gas in the plantar soft tissues on radiographs 11/21/2023 EXAM: MRI OF THE LEFT FOOT WITHOUT CONTRAST TECHNIQUE: Multiplanar, multisequence MR imaging of the left forefoot was performed. No intravenous contrast was administered. COMPARISON:  11/21/2023 radiographs FINDINGS: Bones/Joint/Cartilage Arthropathy of the proximal interphalangeal joint of the second toe with potential resorption of the head of the proximal phalanx,  hyperflexion of the PIP joint, and edema signal especially in the distal half of the proximal phalanx and also on the base of the middle phalanx. Strictly speaking osteomyelitis is not excluded. No obvious joint effusion. Stress fracture of the proximal metaphysis of the first metatarsal with associated low-grade edema throughout the shaft of the first metatarsal. The stress fractures primarily visible laterally as on image 15 series 12. Marrow edema distally in the lateral cuneiform as on image 16 series 12, compatible with stress injury. Partially visualized findings in the hindfoot are reserved for the ankle MRI dictation. Ligaments Lisfranc ligament intact. Muscles and Tendons Abnormal muscular edema in the abductor hallucis muscle on 9 series 11 with  some laxity along the myotendinous junction suspicious for muscle tear. Low-level edema within along remaining musculature of the forefoot likely neurogenic. Atrophic extensor digitorum brevis muscle. Soft tissues Loculations of gas an infiltrative fluid track along the plantar-lateral margin of the abductor digiti minimi muscle. Subcutaneous edema tracks along the plantar foot and to a lesser extent along the dorsal foot. IMPRESSION: 1. Loculations of gas and infiltrative fluid track along the plantar-lateral margin of the abductor digiti minimi muscle. This is compatible with soft tissue infection and gas-forming organism. 2. Abnormal muscular edema in the abductor hallucis muscle with some laxity along the myotendinous junction suspicious for muscle tear. 3. Stress fracture of the proximal metaphysis of the first metatarsal with associated low-grade edema throughout the shaft of the first metatarsal. 4. Marrow edema distally in the lateral cuneiform, compatible with stress injury. 5. Arthropathy of the proximal interphalangeal joint of the second toe with potential resorption of the head of the proximal phalanx, hyperflexion of the PIP joint, and edema signal  especially in the distal half of the proximal phalanx and also on the base of the middle phalanx. Strictly speaking osteomyelitis is not excluded. 6. Subcutaneous edema tracks along the plantar foot and to a lesser extent along the dorsal foot. 7. Partially visualized findings in the hindfoot are reserved for the ankle MRI dictation. Electronically Signed   By: Freida Jes M.D.   On: 11/23/2023 10:25   - pertinent xrays, CT, MRI studies were reviewed and independently interpreted  Positive ROS: All other systems have been reviewed and were otherwise negative with the exception of those mentioned in the HPI and as above.  Physical Exam: General: Alert, no acute distress Psychiatric: Patient is competent for consent with normal mood and affect Lymphatic: No axillary or cervical lymphadenopathy Cardiovascular: No pedal edema Respiratory: No cyanosis, no use of accessory musculature GI: No organomegaly, abdomen is soft and non-tender    Images:  @ENCIMAGES @  Labs:  Lab Results  Component Value Date   HGBA1C 7.1 (H) 11/22/2023   HGBA1C 6.1 (H) 04/11/2021   HGBA1C 6.5 (H) 10/02/2020   ESRSEDRATE 77 (H) 10/23/2009   ESRSEDRATE 80 (H) 10/21/2009   ESRSEDRATE 8 05/30/2007   REPTSTATUS PENDING 11/22/2023   GRAMSTAIN  04/10/2021    ABUNDANT WBC PRESENT,BOTH PMN AND MONONUCLEAR ABUNDANT GRAM POSITIVE COCCI MODERATE GRAM NEGATIVE RODS    CULT  11/22/2023    NO GROWTH 2 DAYS Performed at [facility], [address], [place], [place] [address]    LABORGA PROTEUS MIRABILIS (A) 05/19/2022   LABORGA KLEBSIELLA PNEUMONIAE (A) 05/19/2022    Lab Results  Component Value Date   ALBUMIN  2.1 (L) 11/23/2023   ALBUMIN  2.5 (L) 11/22/2023   ALBUMIN  2.5 (L) 11/21/2023        Latest Ref Rng & Units 11/23/2023    6:31 AM 11/22/2023    8:19 AM 11/22/2023   12:49 AM  CBC EXTENDED  WBC 4.0 - 10.5 K/uL 19.8  17.7    RBC 3.87 - 5.11 MIL/uL 4.16  4.62    Hemoglobin 12.0 - 15.0 g/dL  8.4  9.1  8.4   HCT 60.4 - 46.0 % 26.7  30.3  28.2   Platelets 150 - 400 K/uL 343  376    NEUT# 1.7 - 7.7 K/uL 16.0     Lymph# 0.7 - 4.0 K/uL 2.2       Neurologic: Patient does not have protective sensation bilateral lower extremities.   MUSCULOSKELETAL:   Skin:  Examination there is black gangrenous changes of the entire heel pad.  Review of the MRI scan shows extensive air in the soft tissue involving the hindfoot extending up to the midfoot with osteomyelitis of the calcaneus.  There is edema in the talus and [place] radiographs appear that this is consistent with a stress fracture through the talar neck.  Patient has no active plantarflexion or dorsiflexion of the left ankle no active range of motion of her toes.  Patient states she is nonambulatory.  White cell count 19.8 with a hemoglobin of 8.4.  Albumin  2.1.  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  Plan: Plan: Recommended proceeding with a below-knee amputation.  Patient states she will not consider surgery.  Patient also refused her insulin  this morning.  Recommend hospice care.  Thank you for the consult and the opportunity to see Ms. Dondra Fuel, MD [facility] [phone] 7:50 AM

## 2023-11-24 NOTE — Progress Notes (Signed)
 [facility] Progress Note  Name: KALIKA SMAY MRN: [id number] DOB: 26-Nov-1965  Chief Complaint:  Foot wound   Subjective:  Last HD on 5/20 with 1.2 kg UF.  Seen and examined on dialysis.  Blood pressure 143/60 and HR 65.  Tolerating goal.  Left internal jugular tunn catheter in use.  Procedure supervised.    Review of systems:    Denies shortness of breath or chest pain  Denies n/v Foot pain   ------------------ Background on consult:  RAMLA HASE is a 58 y.o. female with PMH of ESRD, GERD, HTN, left eye blindness, T2DM, hx of lumbar radiculopathy. She was brought to the hospital for worsening left heel wound. She has a diabetic foot ulcer around her right heel. ED workup showed infection of the wound and cellulitis. No sign of osteo. Patient also had hgb of 6.1 with no clear source of bleeding. She is from [facility]   Patient is on HD at [facility] and dialyzes on TTS. Patient recently transferred from an outside practice to the facility.  She has been on HD for 2 years. We were consulted to help coordinate HD while she is in the hospital.     Intake/Output Summary (Last 24 hours) at 11/24/2023 1228 Last data filed at 11/24/2023 0729 Gross per 24 hour  Intake 890.58 ml  Output --  Net 890.58 ml    Vitals:  Vitals:   11/23/23 2017 11/23/23 2109 11/24/23 0441 11/24/23 0553  BP: (!) 126/56 (!) 126/56 (!) 144/59 (!) 144/59  Pulse: 61 61 61   Resp: 17  17   Temp: 99.1 F (37.3 C)  99.6 F (37.6 C)   TempSrc: Oral  Oral   SpO2: 96%  96%   Weight:      Height:         Physical Exam:    General adult female in bed in no acute distress HEENT normocephalic atraumatic extraocular movements intact sclera anicteric Neck supple trachea midline Lungs clear to auscultation bilaterally normal work of breathing at rest  Heart S1S2 no rub Abdomen soft nontender nondistended Extremities 1+ pedal edema; left foot is wrapped with foul smelling wound Psych  flat mood and affect Neuro alert and conversant; provides basic history and follows commands  Access Left internal jugular tunn catheter in use  Medications reviewed   Labs:     Latest Ref Rng & Units 11/24/2023    6:24 AM 11/23/2023    6:31 AM 11/22/2023   12:49 AM  BMP  Glucose 70 - 99 mg/dL 409  811  914   BUN 6 - 20 mg/dL 20  10  23    Creatinine 0.44 - 1.00 mg/dL 7.82  9.56  2.13   Sodium 135 - 145 mmol/L 133  133  132   Potassium 3.5 - 5.1 mmol/L 4.1  3.9  3.6   Chloride 98 - 111 mmol/L 97  98  95   CO2 22 - 32 mmol/L 24  24  25    Calcium  8.9 - 10.3 mg/dL 7.5  8.0  8.0    Dialysis Orders:  GO TTS 3.75 hours  Heparin  5000 units BFR 350/ DFR 600 3K/2.5 Ca bath EDW 64 kg TDC Mircera 225 mcg q 2 weeks. Last dose 11/17/23 Calcitriol 0.25 mcg q HD  Assessment/Plan:   Left Foot wound - abx and imaging per primary team    ESRD - HD per TTS schedule    HTN -  - note on  an atypical regimen outpatient including high dose clonidine  patch.    - Note we reordered her clonidine  patch at a dose of 0.1 mg for now (the 0.2 mg patch was never applied) - May need to reduce amlodipine  - appears was on multiple BP medications every other day outpatient.  No other changes right now as she is presently well controlled on a more standard regimen    Anemia of CKD - on max ESA outpt, recently dosed on 5/15.  PRBC's per primary team    Metabolic bone disease - hypophosphatemia - phos in AM.  Defer binder. On calcitriol    Disposition - per primary team   Nan Aver, MD 11/24/2023 1:13 PM

## 2023-11-25 ENCOUNTER — Inpatient Hospital Stay (HOSPITAL_COMMUNITY)

## 2023-11-25 ENCOUNTER — Encounter (HOSPITAL_COMMUNITY)
Admission: EM | Disposition: A | Discharge status: Skilled Nursing Facility | Payer: Self-pay | Source: Home / Self Care | Attending: Family Medicine

## 2023-11-25 ENCOUNTER — Other Ambulatory Visit: Payer: Self-pay

## 2023-11-25 ENCOUNTER — Encounter (HOSPITAL_COMMUNITY): Payer: Self-pay | Admitting: Internal Medicine

## 2023-11-25 DIAGNOSIS — I120 Hypertensive chronic kidney disease with stage 5 chronic kidney disease or end stage renal disease: Secondary | ICD-10-CM

## 2023-11-25 DIAGNOSIS — M869 Osteomyelitis, unspecified: Secondary | ICD-10-CM | POA: Diagnosis not present

## 2023-11-25 DIAGNOSIS — N186 End stage renal disease: Secondary | ICD-10-CM

## 2023-11-25 DIAGNOSIS — E1169 Type 2 diabetes mellitus with other specified complication: Secondary | ICD-10-CM

## 2023-11-25 DIAGNOSIS — L03116 Cellulitis of left lower limb: Secondary | ICD-10-CM | POA: Diagnosis not present

## 2023-11-25 DIAGNOSIS — E1122 Type 2 diabetes mellitus with diabetic chronic kidney disease: Secondary | ICD-10-CM | POA: Diagnosis not present

## 2023-11-25 HISTORY — PX: AMPUTATION: SHX166

## 2023-11-25 LAB — TYPE AND SCREEN
ABO/RH(D): O POS
Antibody Screen: NEGATIVE
Unit division: 0

## 2023-11-25 LAB — RENAL FUNCTION PANEL
Albumin: 2.1 g/dL — ABNORMAL LOW (ref 3.5–5.0)
Anion gap: 14 (ref 5–15)
BUN: 13 mg/dL (ref 6–20)
CO2: 24 mmol/L (ref 22–32)
Calcium: 8 mg/dL — ABNORMAL LOW (ref 8.9–10.3)
Chloride: 96 mmol/L — ABNORMAL LOW (ref 98–111)
Creatinine, Ser: 2.23 mg/dL — ABNORMAL HIGH (ref 0.44–1.00)
GFR, Estimated: 25 mL/min — ABNORMAL LOW (ref >60)
Glucose, Bld: 180 mg/dL — ABNORMAL HIGH (ref 70–99)
Phosphorus: 2.3 mg/dL — ABNORMAL LOW (ref 2.5–4.6)
Potassium: 3.9 mmol/L (ref 3.5–5.1)
Sodium: 134 mmol/L — ABNORMAL LOW (ref 135–145)

## 2023-11-25 LAB — BPAM RBC
Blood Product Expiration Date: 202505242359
Unit Type and Rh: 5100

## 2023-11-25 LAB — GLUCOSE, CAPILLARY
Glucose-Capillary: 179 mg/dL — ABNORMAL HIGH (ref 70–99)
Glucose-Capillary: 184 mg/dL — ABNORMAL HIGH (ref 70–99)
Glucose-Capillary: 148 mg/dL — ABNORMAL HIGH (ref 70–99)
Glucose-Capillary: 116 mg/dL — ABNORMAL HIGH (ref 70–99)

## 2023-11-25 LAB — CBC WITH DIFFERENTIAL/PLATELET
Abs Immature Granulocytes: 0.08 10*3/uL — ABNORMAL HIGH (ref 0.00–0.07)
Basophils Absolute: 0.1 10*3/uL (ref 0.0–0.1)
Basophils Relative: 0 %
Eosinophils Absolute: 0.4 10*3/uL (ref 0.0–0.5)
Eosinophils Relative: 2 %
HCT: 27.3 % — ABNORMAL LOW (ref 36.0–46.0)
Hemoglobin: 8.4 g/dL — ABNORMAL LOW (ref 12.0–15.0)
Immature Granulocytes: 0 %
Lymphocytes Relative: 10 %
Lymphs Abs: 1.8 10*3/uL (ref 0.7–4.0)
MCH: 20.2 pg — ABNORMAL LOW (ref 26.0–34.0)
MCHC: 30.8 g/dL (ref 30.0–36.0)
MCV: 65.6 fL — ABNORMAL LOW (ref 80.0–100.0)
Monocytes Absolute: 1.4 10*3/uL — ABNORMAL HIGH (ref 0.1–1.0)
Monocytes Relative: 7 %
Neutro Abs: 15.6 10*3/uL — ABNORMAL HIGH (ref 1.7–7.7)
Neutrophils Relative %: 81 %
Platelets: 322 10*3/uL (ref 150–400)
RBC: 4.16 MIL/uL (ref 3.87–5.11)
RDW: 25.5 % — ABNORMAL HIGH (ref 11.5–15.5)
Smear Review: NORMAL
WBC: 19.3 10*3/uL — ABNORMAL HIGH (ref 4.0–10.5)
nRBC: 0.1 % (ref 0.0–0.2)

## 2023-11-25 LAB — SURGICAL PCR SCREEN
MRSA, PCR: NEGATIVE
Staphylococcus aureus: NEGATIVE

## 2023-11-25 SURGERY — AMPUTATION BELOW KNEE
Anesthesia: Monitor Anesthesia Care | Site: Knee | Laterality: Left

## 2023-11-25 MED ORDER — CHLORHEXIDINE GLUCONATE CLOTH 2 % EX PADS: 6.0000 | MEDICATED_PAD | Freq: Every day | CUTANEOUS | Status: DC

## 2023-11-25 MED ORDER — CHLORHEXIDINE GLUCONATE CLOTH 2 % EX PADS
6.0000 | MEDICATED_PAD | Freq: Every day | CUTANEOUS | Status: DC
  Administered 2023-11-29 – 2023-12-02 (×3): 6 via TOPICAL

## 2023-11-25 MED ORDER — DEXMEDETOMIDINE HCL IN NACL 80 MCG/20ML IV SOLN
INTRAVENOUS | Status: DC | PRN
  Administered 2023-11-25: 4 ug via INTRAVENOUS

## 2023-11-25 MED ORDER — HYDROMORPHONE HCL 1 MG/ML IJ SOLN
0.2500 mg | INTRAMUSCULAR | Status: DC | PRN
  Administered 2023-11-25: 0.25 mg via INTRAVENOUS

## 2023-11-25 MED ORDER — POVIDONE-IODINE 10 % EX SWAB
2.0000 | Freq: Once | CUTANEOUS | Status: AC
  Administered 2023-11-25: 2 via TOPICAL

## 2023-11-25 MED ORDER — AMISULPRIDE (ANTIEMETIC) 5 MG/2ML IV SOLN
5.0000 mg | Freq: Once | INTRAVENOUS | Status: AC
  Administered 2023-11-25: 5 mg via INTRAVENOUS

## 2023-11-25 MED ORDER — ONDANSETRON HCL 4 MG/2ML IJ SOLN
INTRAMUSCULAR | Status: DC | PRN
  Administered 2023-11-25: 4 mg via INTRAVENOUS

## 2023-11-25 MED ORDER — BUPIVACAINE LIPOSOME 1.3 % IJ SUSP
INTRAMUSCULAR | Status: DC | PRN
  Administered 2023-11-25: 10 mL via PERINEURAL

## 2023-11-25 MED ORDER — CHLORHEXIDINE GLUCONATE 0.12 % MT SOLN: 15.0000 mL | Freq: Once | OROMUCOSAL | Status: AC

## 2023-11-25 MED ORDER — PROPOFOL 500 MG/50ML IV EMUL
INTRAVENOUS | Status: DC | PRN
  Administered 2023-11-25: 100 ug/kg/min via INTRAVENOUS

## 2023-11-25 MED ORDER — FENTANYL CITRATE (PF) 100 MCG/2ML IJ SOLN
INTRAMUSCULAR | Status: AC
  Administered 2023-11-25: 50 ug via INTRAVENOUS
  Filled 2023-11-25: qty 2

## 2023-11-25 MED ORDER — ALBUMIN HUMAN 5 % IV SOLN
INTRAVENOUS | Status: AC
  Filled 2023-11-25: qty 250

## 2023-11-25 MED ORDER — SODIUM CHLORIDE 0.9 % IV SOLN: INTRAVENOUS | Status: DC

## 2023-11-25 MED ORDER — CHLORHEXIDINE GLUCONATE 0.12 % MT SOLN
OROMUCOSAL | Status: AC
Start: 2023-11-25 — End: 2023-11-25
  Administered 2023-11-25: 15 mL via OROMUCOSAL
  Filled 2023-11-25: qty 15

## 2023-11-25 MED ORDER — AMISULPRIDE (ANTIEMETIC) 5 MG/2ML IV SOLN
INTRAVENOUS | Status: AC
  Filled 2023-11-25: qty 2

## 2023-11-25 MED ORDER — 0.9 % SODIUM CHLORIDE (POUR BTL) OPTIME
TOPICAL | Status: DC | PRN
  Administered 2023-11-25: 1000 mL

## 2023-11-25 MED ORDER — HYDROMORPHONE HCL 1 MG/ML IJ SOLN
INTRAMUSCULAR | Status: AC
  Filled 2023-11-25: qty 1

## 2023-11-25 MED ORDER — SODIUM CHLORIDE 0.9% FLUSH: 10.0000 mL | INTRAVENOUS | Status: DC | PRN

## 2023-11-25 MED ORDER — SODIUM CHLORIDE 0.9% FLUSH
10.0000 mL | Freq: Two times a day (BID) | INTRAVENOUS | Status: DC
  Administered 2023-11-26 – 2023-12-05 (×11): 10 mL

## 2023-11-25 MED ORDER — INSULIN ASPART 100 UNIT/ML IJ SOLN
INTRAMUSCULAR | Status: AC
  Administered 2023-11-25: 2 [IU] via SUBCUTANEOUS
  Filled 2023-11-25: qty 1

## 2023-11-25 MED ORDER — PHENYLEPHRINE 80 MCG/ML (10ML) SYRINGE FOR IV PUSH (FOR BLOOD PRESSURE SUPPORT)
PREFILLED_SYRINGE | INTRAVENOUS | Status: DC | PRN
  Administered 2023-11-25: 80 ug via INTRAVENOUS

## 2023-11-25 MED ORDER — CEFAZOLIN SODIUM-DEXTROSE 2-4 GM/100ML-% IV SOLN
INTRAVENOUS | Status: AC
  Filled 2023-11-25: qty 100

## 2023-11-25 MED ORDER — CEFAZOLIN SODIUM-DEXTROSE 2-4 GM/100ML-% IV SOLN
2.0000 g | INTRAVENOUS | Status: AC
  Administered 2023-11-25: 2 g via INTRAVENOUS
  Filled 2023-11-25: qty 100

## 2023-11-25 MED ORDER — GLYCOPYRROLATE 0.2 MG/ML IJ SOLN
INTRAMUSCULAR | Status: DC | PRN
  Administered 2023-11-25: .1 mg via INTRAVENOUS

## 2023-11-25 MED ORDER — VANCOMYCIN HCL 750 MG/150ML IV SOLN
750.0000 mg | INTRAVENOUS | Status: DC
  Filled 2023-11-25: qty 150

## 2023-11-25 MED ORDER — INSULIN ASPART 100 UNIT/ML IJ SOLN
0.0000 [IU] | INTRAMUSCULAR | Status: DC | PRN
Start: 2023-11-25 — End: 2023-11-25

## 2023-11-25 MED ORDER — MIDAZOLAM HCL 2 MG/2ML IJ SOLN: 1.0000 mg | Freq: Once | INTRAMUSCULAR | Status: AC

## 2023-11-25 MED ORDER — PROPOFOL 10 MG/ML IV BOLUS
INTRAVENOUS | Status: DC | PRN
  Administered 2023-11-25 (×3): 20 mg via INTRAVENOUS

## 2023-11-25 MED ORDER — BUPIVACAINE-EPINEPHRINE (PF) 0.5% -1:200000 IJ SOLN
INTRAMUSCULAR | Status: DC | PRN
  Administered 2023-11-25: 10 mL via PERINEURAL
  Administered 2023-11-25: 20 mL via PERINEURAL

## 2023-11-25 MED ORDER — MIDAZOLAM HCL 2 MG/2ML IJ SOLN
INTRAMUSCULAR | Status: AC
  Administered 2023-11-25: 1 mg via INTRAVENOUS
  Filled 2023-11-25: qty 2

## 2023-11-25 MED ORDER — VASHE WOUND IRRIGATION OPTIME
TOPICAL | Status: DC | PRN
  Administered 2023-11-25: 34 [oz_av]

## 2023-11-25 MED ORDER — FENTANYL CITRATE (PF) 100 MCG/2ML IJ SOLN: 50.0000 ug | Freq: Once | INTRAMUSCULAR | Status: AC

## 2023-11-25 MED ORDER — ORAL CARE MOUTH RINSE: 15.0000 mL | Freq: Once | OROMUCOSAL | Status: AC

## 2023-11-25 MED ORDER — ALBUMIN HUMAN 5 % IV SOLN
12.5000 g | Freq: Once | INTRAVENOUS | Status: AC
  Administered 2023-11-25: 12.5 g via INTRAVENOUS

## 2023-11-25 MED ORDER — PHENYLEPHRINE HCL-NACL 20-0.9 MG/250ML-% IV SOLN
INTRAVENOUS | Status: DC | PRN
  Administered 2023-11-25: 30 ug/min via INTRAVENOUS

## 2023-11-25 MED ORDER — CHLORHEXIDINE GLUCONATE 4 % EX SOLN
60.0000 mL | Freq: Once | CUTANEOUS | Status: DC
  Filled 2023-11-25: qty 60

## 2023-11-25 SURGICAL SUPPLY — 34 items
BAG COUNTER SPONGE SURGICOUNT (BAG) IMPLANT
BLADE SAW RECIP 87.9 MT (BLADE) ×1 IMPLANT
BLADE SURG 21 STRL SS (BLADE) ×1 IMPLANT
BNDG COHESIVE 6X5 TAN ST LF (GAUZE/BANDAGES/DRESSINGS) IMPLANT
CANISTER WOUND CARE 500ML ATS (WOUND CARE) ×1 IMPLANT
CLEANSER WND VASHE INSTL 34OZ (WOUND CARE) IMPLANT
COVER SURGICAL LIGHT HANDLE (MISCELLANEOUS) ×1 IMPLANT
CUFF TRNQT CYL 34X4.125X (TOURNIQUET CUFF) ×1 IMPLANT
DRAPE DERMATAC (DRAPES) IMPLANT
DRAPE INCISE IOBAN 66X45 STRL (DRAPES) ×1 IMPLANT
DRAPE U-SHAPE 47X51 STRL (DRAPES) ×1 IMPLANT
DRESSING PREVENA PLUS CUSTOM (GAUZE/BANDAGES/DRESSINGS) ×1 IMPLANT
DURAPREP 26ML APPLICATOR (WOUND CARE) ×1 IMPLANT
ELECTRODE REM PT RTRN 9FT ADLT (ELECTROSURGICAL) ×1 IMPLANT
GLOVE BIOGEL PI IND STRL 9 (GLOVE) ×1 IMPLANT
GLOVE SURG ORTHO 9.0 STRL STRW (GLOVE) ×1 IMPLANT
GOWN STRL REUS W/ TWL XL LVL3 (GOWN DISPOSABLE) ×2 IMPLANT
GRAFT SKIN WND MICRO 38 (Tissue) IMPLANT
KIT BASIN OR (CUSTOM PROCEDURE TRAY) ×1 IMPLANT
KIT TURNOVER KIT B (KITS) ×1 IMPLANT
MANIFOLD NEPTUNE II (INSTRUMENTS) ×1 IMPLANT
NS IRRIG 1000ML POUR BTL (IV SOLUTION) ×1 IMPLANT
PACK ORTHO EXTREMITY (CUSTOM PROCEDURE TRAY) ×1 IMPLANT
PAD ARMBOARD POSITIONER FOAM (MISCELLANEOUS) ×1 IMPLANT
PREVENA RESTOR ARTHOFORM 46X30 (CANNISTER) ×1 IMPLANT
SPONGE T-LAP 18X18 ~~LOC~~+RFID (SPONGE) IMPLANT
STAPLER VISISTAT 35W (STAPLE) IMPLANT
STOCKINETTE IMPERVIOUS LG (DRAPES) ×1 IMPLANT
SUT ETHILON 2 0 PSLX (SUTURE) IMPLANT
SUT SILK 2-0 18XBRD TIE 12 (SUTURE) ×1 IMPLANT
SUT VIC AB 1 CTX 27 (SUTURE) ×2 IMPLANT
TOWEL GREEN STERILE (TOWEL DISPOSABLE) ×1 IMPLANT
TUBE CONNECTING 12X1/4 (SUCTIONS) ×1 IMPLANT
YANKAUER SUCT BULB TIP NO VENT (SUCTIONS) ×1 IMPLANT

## 2023-11-25 NOTE — Progress Notes (Signed)
 Pt has refused scheduled CBG checks and scheduled insulin  since 1900. Made MD aware.

## 2023-11-25 NOTE — Anesthesia Postprocedure Evaluation (Signed)
 Anesthesia Post Note  Patient: [name] [name]  Procedure(s) Performed: AMPUTATION BELOW KNEE LEFT (Left: Knee)     Patient location during evaluation: PACU Anesthesia Type: Regional and MAC Level of consciousness: awake and alert Pain management: pain level controlled Vital Signs Assessment: post-procedure vital signs reviewed and stable Respiratory status: spontaneous breathing, nonlabored ventilation and respiratory function stable Cardiovascular status: stable and blood pressure returned to baseline Postop Assessment: no apparent nausea or vomiting Anesthetic complications: no  No notable events documented.  Last Vitals:  Vitals:   11/25/23 1400 11/25/23 1415  BP: (!) 99/59 (!) 102/55  Pulse: 60 (!) 59  Resp: 13 17  Temp:    SpO2: 100% 100%    Last Pain:  Vitals:   11/25/23 1418  TempSrc:   PainSc: 6                  Kissy Cielo,W. EDMOND

## 2023-11-25 NOTE — Anesthesia Procedure Notes (Signed)
 Anesthesia Regional Block: Adductor canal block   Pre-Anesthetic Checklist: , timeout performed,  Correct Patient, Correct Site, Correct Laterality,  Correct Procedure, Correct Position, site marked,  Risks and benefits discussed,  Pre-op evaluation,  At surgeon's request and post-op pain management  Laterality: Left  Prep: Maximum Sterile Barrier Precautions used, chloraprep       Needles:  Injection technique: Single-shot  Needle Type: Echogenic Stimulator Needle     Needle Length: 9cm  Needle Gauge: 21     Additional Needles:   Procedures:,,,, ultrasound used (permanent image in chart),,    Narrative:  Start time: 11/25/2023 12:02 PM End time: 11/25/2023 12:07 PM Injection made incrementally with aspirations every 5 mL.  Performed by: Personally  Anesthesiologist: Jake Mayers, MD

## 2023-11-25 NOTE — Plan of Care (Signed)
   Problem: Safety: Goal: Ability to remain free from injury will improve Outcome: Progressing

## 2023-11-25 NOTE — Op Note (Signed)
 11/25/2023  1:43 PM  PATIENT:  [name] [name]    PRE-OPERATIVE DIAGNOSIS:  Osteomyelitis Left Foot  POST-OPERATIVE DIAGNOSIS:  Same  PROCEDURE:  AMPUTATION BELOW KNEE LEFT Application of Kerecis micro graft 38 cm  Application of Prevena customizable and Prevena arthroform wound VAC dressings Application of Vive Wear stump shrinker and the Hanger limb protector  SURGEON:  Timothy Ford, MD  ANESTHESIA:   General  PREOPERATIVE INDICATIONS:  [name] is a  58 y.o. female with a diagnosis of Osteomyelitis Left Foot who failed conservative measures and elected for surgical management.    The risks benefits and alternatives were discussed with the patient preoperatively including but not limited to the risks of infection, bleeding, nerve injury, cardiopulmonary complications, the need for revision surgery, among others, and the patient was willing to proceed.  OPERATIVE IMPLANTS:   Implant Name Type Inv. Item Serial No. Manufacturer Lot No. LRB No. Used Action  GRAFT SKIN WND MICRO 38 - [id number] Tissue GRAFT SKIN WND MICRO 38  [employer] [id number] Left 1 Implanted     OPERATIVE FINDINGS: lateral tibia plateau plate extend to the end of the tibia  OPERATIVE PROCEDURE: Patient was brought to the operating room after undergoing a regional anesthetic.  After adequate levels anesthesia were obtained a thigh tourniquet was placed and the lower extremity was prepped using DuraPrep draped into a sterile field. The foot was draped out of the sterile field with impervious stockinette.  A timeout was called and the tourniquet inflated.  A transverse skin incision was made 12 cm distal to the tibial tubercle, the incision curved proximally, and a large posterior flap was created.  The tibia was transected just proximal to the skin incision and beveled anteriorly.  The fibula was transected just proximal to the tibial incision.  The sciatic nerve was pulled cut and allowed to retract.  The  vascular bundles were suture ligated with 2-0 silk.  The tourniquet was deflated and hemostasis obtained.      The Kerecis micro powder 38 cm was applied to the open wound that has a 200 cm surface area.    The deep and superficial fascial layers were closed using #1 Vicryl.  The skin was closed using staples.    The Prevena customizable dressing was applied this was overwrapped with the arthroform sponge.  Yvone Herd was used to secure the sponges and the circumferential compression was secured to the skin with Dermatac.  This was connected to the wound VAC pump and had a good suction fit this was covered with a stump shrinker and a limb protector.  Patient was taken to the PACU in stable condition.   DISCHARGE PLANNING:  Antibiotic duration: 24-hour antibiotics  Weightbearing: Nonweightbearing on the operative extremity  Pain medication: Opioid pathway  Dressing care/ Wound VAC: Continue wound VAC with the Prevena plus pump at discharge for 1 week  Ambulatory devices: Walker or kneeling scooter  Discharge to: Discharge planning based on recommendations per physical therapy  Follow-up: In the office 1 week after discharge.

## 2023-11-25 NOTE — Plan of Care (Signed)
    Referral received for Mahalia Schultze :goals of care discussion. Chart reviewed. Patient not at the bedside, per chart she is in OR for BKA.   PMT will re-attempt to visit at a later time/date. Detailed note and recommendations to follow once GOC has been completed.   Thank you for your referral and allowing PMT to assist in Ms. Quanasia Defino Cockerill's care.    Trueman Worlds, PA-C Palliative Medicine Team  Team Phone # [phone]   NO CHARGE

## 2023-11-25 NOTE — Progress Notes (Signed)
 PROGRESS NOTE    TOSHIBA NULL  WUJ:[id number] DOB: 12/06/1965 DOA: 11/21/2023 PCP: Alwyn Juba., MD   Brief Narrative:  The patient 58 year old African-American female with a past medical history significant for ESRD on hemodialysis Tuesday Thursday Saturdays, GERD, essential hypertension, left eye blindness, diabetes mellitus type 2 which is fairly well-controlled, history of lumbar radiculopathy presented from her SNF from [facility] with a left heel wound and cellulitis and suspicion for possible and gas-forming infection. She has been undergoing wound care treatments but states that the foot has gotten worse. Workup in the ED shows a wound infection and cellulitis but there is concern for deeper infection so we will obtain an MRI of the heel and foot and obtain vascular ABIs. On presentation she is found to have a hemoglobin of 6.1 with no obvious source of bleeding so she is typed and screened and transfused 1 unit PRBCs. Nephrology is consulted for maintenance of hemodialysis and pending further workup may need to consult vascular surgery and orthopedic surgery for further evaluation   Assessment & Plan:   Principal Problem:   Cellulitis of left lower extremity Active Problems:   Diabetic neuropathy (HCC)   Essential hypertension   GERD (gastroesophageal reflux disease)   ESRD (end stage renal disease) (HCC)   DM2 (diabetes mellitus, type 2) (HCC)   Anemia due to chronic kidney disease   Osteomyelitis of left foot (HCC)  Infected Diabetic Foot ulcer with Cellulitis/gangrene and gas-forming infection of the left heel/osteomyelitis: The unstageable pressure heel left ulcer with a wound bed black eschar.  States that she sees the wound clinic but is not getting better.  she received Unasyn  and linezolid  in the ED and was later started on cefepime  and vancomycin .  Blood cultures x 2 were obtained and NGTD.  MRI confirms osteomyelitis.  Ortho consulted, patient seen by Dr. Julio Ohm who  recommended BKA which patient initially refused but then agreed after lengthy counseling.  She is scheduled to have surgery today.    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 C/w Sensitive Novolog  SSI AC/HS. Has been refusing her Insulin  regimen.    HypoNa+: Mild, asymptomatic, improving.   Essential HTN: Blood pressure fairly controlled.  C/w Amlodipine  10 mg po Daily and Metoprolol Tartrate 50 mg po BID.    ESRD on HD TTSat: Nephrology following.  Appreciate their help with management of this.   Symptomatic Anemia/Microcytic Anemia/Anemia of Chronic Kidney Disease: Globin 6.1 upon arrival, status post 1 unit of PRBC transfusion, hemoglobin improved to 8.4 but dropped to 7.8 today.  Monitor daily and transfuse if less than 7.  Thrombocytosis: Improved.  DVT prophylaxis: SCDs Start: 11/21/23 2128   Code Status: Full Code  Family Communication:  None present at bedside.  Plan of care discussed with patient in length and he/she verbalized understanding and agreed with it.  Status is: Inpatient Remains inpatient appropriate because: Scheduled for BKA today.   Estimated body mass index is 27.47 kg/m as calculated from the following:   Height as of this encounter: 5' (1.524 m).   Weight as of this encounter: 63.8 kg.    Nutritional Assessment: Body mass index is 27.47 kg/m.Aaron Aas Seen by dietician.  I agree with the assessment and plan as outlined below: Nutrition Status:        . Skin Assessment: I have examined the patient's skin and I agree with the wound assessment as performed by the wound care RN as outlined below:  Consultants:  Podiatry  Procedures:  None  Antimicrobials:  Anti-infectives (From admission, onward)    Start     Dose/Rate Route Frequency Ordered Stop   11/25/23 0600  ceFAZolin  (ANCEF ) IVPB 2g/100 mL premix        2 g 200 mL/hr over 30 Minutes Intravenous On call to O.R. 11/25/23 0214 11/26/23 0559    11/24/23 1200  vancomycin  (VANCOREADY) IVPB 750 mg/150 mL        750 mg 150 mL/hr over 60 Minutes Intravenous Every T-Th-Sa (Hemodialysis) 11/22/23 1437 11/24/23 1253   11/23/23 1330  vancomycin  (VANCOREADY) IVPB 750 mg/150 mL        750 mg 150 mL/hr over 60 Minutes Intravenous  Once 11/23/23 1243 11/23/23 1529   11/22/23 0600  Vancomycin  (VANCOCIN ) 1,250 mg in sodium chloride  0.9 % 250 mL IVPB        1,250 mg 166.7 mL/hr over 90 Minutes Intravenous  Once 11/21/23 2122 11/22/23 0714   11/22/23 0200  piperacillin -tazobactam (ZOSYN ) IVPB 2.25 g  Status:  Discontinued        2.25 g 100 mL/hr over 30 Minutes Intravenous Every 8 hours 11/21/23 2122 11/21/23 2126   11/21/23 2230  ceFEPIme  (MAXIPIME ) 1 g in sodium chloride  0.9 % 100 mL IVPB        1 g 200 mL/hr over 30 Minutes Intravenous Every 24 hours 11/21/23 2126     11/21/23 2120  vancomycin  variable dose per unstable renal function (pharmacist dosing)         Does not apply See admin instructions 11/21/23 2122     11/21/23 1600  Ampicillin -Sulbactam (UNASYN ) 3 g in sodium chloride  0.9 % 100 mL IVPB       Placed in "And" Linked Group   3 g 200 mL/hr over 30 Minutes Intravenous  Once 11/21/23 1547 11/21/23 1740   11/21/23 1600  linezolid  (ZYVOX ) IVPB 600 mg       Placed in "And" Linked Group   600 mg 300 mL/hr over 60 Minutes Intravenous  Once 11/21/23 1547 11/21/23 1808         Subjective: Seen and examined.  Sleepy and feels low.  Does not want to talk much.  She is aware that she is going to have surgery at 1 PM today.  Objective: Vitals:   11/24/23 1726 11/24/23 2040 11/25/23 0603 11/25/23 0759  BP: (!) 154/97 (!) 174/68 (!) 159/61 (!) 145/63  Pulse: 79 80 63 62  Resp: 18 18 18 18   Temp: 99.1 F (37.3 C) 99.6 F (37.6 C) 99.7 F (37.6 C)   TempSrc: Oral Oral Oral   SpO2: 100% 100% 98% 98%  Weight:      Height:        Intake/Output Summary (Last 24 hours) at 11/25/2023 0801 Last data filed at 11/24/2023 1622 Gross per  24 hour  Intake --  Output 1200 ml  Net -1200 ml   Filed Weights   11/22/23 2130 11/23/23 0216 11/24/23 1300  Weight: 65.1 kg 63.8 kg 63.8 kg    Examination:  General exam: Appears sleepy and depressed. Respiratory system: Clear to auscultation. Respiratory effort normal. Cardiovascular system: S1 & S2 heard, RRR. No JVD, murmurs, rubs, gallops or clicks. No pedal edema. Gastrointestinal system: Abdomen is nondistended, soft and nontender. No organomegaly or masses felt. Normal bowel sounds heard. Central nervous system: Sleepy but oriented. No focal neurological deficits. Extremities: Symmetric 5 x 5 power. Skin: Wet gangrene of the left heel with foul-smelling mild purulent discharge  as well as erythema around the wound.  Data Reviewed: I have personally reviewed following labs and imaging studies  CBC: Recent Labs  Lab 11/21/23 1641 11/22/23 0049 11/22/23 0819 11/23/23 0631 11/24/23 0624  WBC 18.8*  --  17.7* 19.8* 20.0*  NEUTROABS 14.3*  --   --  16.0* 15.4*  HGB 6.1* 8.4* 9.1* 8.4* 7.8*  HCT 20.9* 28.2* 30.3* 26.7* 24.9*  MCV 63.3*  --  65.6* 64.2* 64.7*  PLT 422*  --  376 343 356   Basic Metabolic Panel: Recent Labs  Lab 11/21/23 1641 11/22/23 0049 11/23/23 0631 11/24/23 0624  NA 133* 132* 133* 133*  K 3.6 3.6 3.9 4.1  CL 96* 95* 98 97*  CO2 27 25 24 24   GLUCOSE 260* 311* 152* 208*  BUN 22* 23* 10 20  CREATININE 3.85* 3.97* 2.14* 3.10*  CALCIUM  7.8* 8.0* 8.0* 7.5*  PHOS  --  2.1* 1.2*  --    GFR: Estimated Creatinine Clearance: 16.7 mL/min (A) (by C-G formula based on SCr of 3.1 mg/dL (H)). Liver Function Tests: Recent Labs  Lab 11/21/23 1641 11/22/23 0049 11/23/23 0631 11/24/23 0624  AST 14*  --   --  15  ALT 7  --   --  9  ALKPHOS 116  --   --  92  BILITOT 0.6  --   --  0.7  PROT 6.6  --   --  6.3*  ALBUMIN  2.5* 2.5* 2.1* 2.0*   No results for input(s): "LIPASE", "AMYLASE" in the last 168 hours. No results for input(s): "AMMONIA" in the  last 168 hours. Coagulation Profile: No results for input(s): "INR", "PROTIME" in the last 168 hours. Cardiac Enzymes: No results for input(s): "CKTOTAL", "CKMB", "CKMBINDEX", "TROPONINI" in the last 168 hours. BNP (last 3 results) No results for input(s): "PROBNP" in the last 8760 hours. HbA1C: No results for input(s): "HGBA1C" in the last 72 hours.  CBG: Recent Labs  Lab 11/25/23 0758  GLUCAP 179*   Lipid Profile: No results for input(s): "CHOL", "HDL", "LDLCALC", "TRIG", "CHOLHDL", "LDLDIRECT" in the last 72 hours. Thyroid  Function Tests: No results for input(s): "TSH", "T4TOTAL", "FREET4", "T3FREE", "THYROIDAB" in the last 72 hours. Anemia Panel: No results for input(s): "VITAMINB12", "FOLATE", "FERRITIN", "TIBC", "IRON ", "RETICCTPCT" in the last 72 hours. Sepsis Labs: Recent Labs  Lab 11/21/23 1647  LATICACIDVEN 0.9    Recent Results (from the past 240 hours)  Blood Cultures x 2 sites     Status: None (Preliminary result)   Collection Time: 11/21/23  4:41 PM   Specimen: BLOOD LEFT ARM  Result Value Ref Range Status   Specimen Description   Final    BLOOD LEFT ARM Performed at [facility], [address] [place], [place] [address]    Special Requests   Final    BOTTLES DRAWN AEROBIC AND ANAEROBIC Blood Culture results may not be optimal due to an inadequate volume of blood received in culture bottles Performed at [facility], [address] [place], [place] [address]    Culture   Final    NO GROWTH 4 DAYS Performed at [facility], [address], [place], [place] [address]    Report Status PENDING  Incomplete  Culture, blood (Routine X 2) w Reflex to ID Panel     Status: None (Preliminary result)   Collection Time: 11/22/23 12:48 AM   Specimen: BLOOD  Result Value Ref Range Status   Specimen Description BLOOD RIGHT ANTECUBITAL  Final   Special Requests   Final    BOTTLES DRAWN AEROBIC AND ANAEROBIC Blood Culture adequate  volume   Culture   Final    NO GROWTH 3 DAYS Performed at [facility], [address], [place], [place] [address]    Report Status PENDING  Incomplete     Radiology Studies: No results found.   Scheduled Meds:  sodium chloride    Intravenous Once   amLODipine   10 mg Oral Daily   calcitRIOL  0.25 mcg Oral Q T,Th,Sa-HD   calcium  carbonate  1 tablet Oral TID WC   chlorhexidine   60 mL Topical Once   cloNIDine   0.1 mg Transdermal Weekly   hydrALAZINE   100 mg Oral Q8H   insulin  aspart  0-5 Units Subcutaneous QHS   insulin  aspart  0-6 Units Subcutaneous TID WC   metoprolol tartrate  50 mg Oral BID   povidone-iodine   2 Application Topical Once   vancomycin  variable dose per unstable renal function (pharmacist dosing)   Does not apply See admin instructions   Continuous Infusions:   ceFAZolin  (ANCEF ) IV     ceFEPime  (MAXIPIME ) IV 1 g (11/24/23 2205)     LOS: 4 days   Modena Andes, MD [facility]  11/25/2023, 8:01 AM   *Please note that this is a verbal dictation therefore any spelling or grammatical errors are due to the "Dragon Medical One" system interpretation.  Please page via Amion and do not message via secure chat for urgent patient care matters. Secure chat can be used for non urgent patient care matters.  How to contact the [facility] Attending or Consulting provider 7A - 7P or covering provider during after hours 7P -7A, for this patient?  Check the care team in [facility] and look for a) attending/consulting [facility] provider listed and b) the [facility] team listed. Page or secure chat 7A-7P. Log into [email or web address] and use [place]'s universal password to access. If you do not have the password, please contact the hospital operator. Locate the [facility] provider you are looking for under [facility] and page to a number that you can be directly reached. If you still have difficulty reaching the provider, please page the [facility] (Director on Call) for the Hospitalists listed on  amion for assistance.

## 2023-11-25 NOTE — Progress Notes (Signed)
 Pt refused CBG check so there was no way to administer the 2200 insulin . Based off the 1558 CBG she was 116 and didn't require coverage at that time.

## 2023-11-25 NOTE — Plan of Care (Signed)
  Problem: Education: Goal: Knowledge of General Education information will improve Description: Including pain rating scale, medication(s)/side effects and non-pharmacologic comfort measures Outcome: Progressing   Problem: Activity: Goal: Risk for activity intolerance will decrease Outcome: Progressing   Problem: Nutrition: Goal: Adequate nutrition will be maintained Outcome: Progressing   Problem: Coping: Goal: Level of anxiety will decrease Outcome: Progressing   Problem: Elimination: Goal: Will not experience complications related to bowel motility Outcome: Progressing   Problem: Pain Managment: Goal: General experience of comfort will improve and/or be controlled Outcome: Progressing

## 2023-11-25 NOTE — Anesthesia Procedure Notes (Signed)
 Anesthesia Regional Block: Popliteal block   Pre-Anesthetic Checklist: , timeout performed,  Correct Patient, Correct Site, Correct Laterality,  Correct Procedure, Correct Position, site marked,  Risks and benefits discussed,  Pre-op evaluation,  At surgeon's request and post-op pain management  Laterality: Left  Prep: Maximum Sterile Barrier Precautions used, chloraprep       Needles:  Injection technique: Single-shot  Needle Type: Echogenic Stimulator Needle     Needle Length: 9cm  Needle Gauge: 21     Additional Needles:   Procedures:,,,, ultrasound used (permanent image in chart),,    Narrative:  Start time: 11/25/2023 11:51 AM End time: 11/25/2023 12:01 PM Injection made incrementally with aspirations every 5 mL.  Performed by: Personally  Anesthesiologist: Jake Mayers, MD

## 2023-11-25 NOTE — Progress Notes (Signed)
 Pt refused her PCR swab this morning for her procedure. MD made aware

## 2023-11-25 NOTE — Interval H&P Note (Signed)
 History and Physical Interval Note:  11/25/2023 6:42 AM  [name] [name]  has presented today for surgery, with the diagnosis of Osteomyelitis Left Foot.  The various methods of treatment have been discussed with the patient and family. After consideration of risks, benefits and other options for treatment, the patient has consented to  Procedure(s): AMPUTATION BELOW KNEE (Left) as a surgical intervention.  The patient's history has been reviewed, patient examined, no change in status, stable for surgery.  I have reviewed the patient's chart and labs.  Questions were answered to the patient's satisfaction.     Diahann Guajardo V Gaia Gullikson

## 2023-11-25 NOTE — Anesthesia Preprocedure Evaluation (Addendum)
 Anesthesia Evaluation  Patient identified by MRN, date of birth, ID band Patient awake    Reviewed: Allergy & Precautions, H&P , NPO status , Patient's Chart, lab work & pertinent test results  Airway Mallampati: III  TM Distance: >3 FB Neck ROM: Full    Dental no notable dental hx. (+) Teeth Intact, Dental Advisory Given   Pulmonary neg pulmonary ROS   Pulmonary exam normal breath sounds clear to auscultation       Cardiovascular hypertension, Pt. on medications and Pt. on home beta blockers  Rhythm:Regular Rate:Normal     Neuro/Psych  Headaches  negative psych ROS   GI/Hepatic Neg liver ROS,GERD  Medicated,,  Endo/Other  diabetes    Renal/GU ESRF and DialysisRenal disease  negative genitourinary   Musculoskeletal   Abdominal   Peds  Hematology  (+) Blood dyscrasia, anemia   Anesthesia Other Findings   Reproductive/Obstetrics negative OB ROS                             Anesthesia Physical Anesthesia Plan  ASA: 3  Anesthesia Plan: MAC and Regional   Post-op Pain Management: Tylenol  PO (pre-op)*   Induction: Intravenous  PONV Risk Score and Plan: 2 and Ondansetron , Propofol  infusion and Midazolam   Airway Management Planned: Natural Airway and Simple Face Mask  Additional Equipment:   Intra-op Plan:   Post-operative Plan:   Informed Consent: I have reviewed the patients History and Physical, chart, labs and discussed the procedure including the risks, benefits and alternatives for the proposed anesthesia with the patient or authorized representative who has indicated his/her understanding and acceptance.       Plan Discussed with: CRNA  Anesthesia Plan Comments:        Anesthesia Quick Evaluation

## 2023-11-25 NOTE — Transfer of Care (Signed)
 Immediate Anesthesia Transfer of Care Note  Patient: [name] [name]  Procedure(s) Performed: AMPUTATION BELOW KNEE LEFT (Left: Knee)  Patient Location: PACU  Anesthesia Type:MAC and Regional  Level of Consciousness: drowsy  Airway & Oxygen Therapy: Patient Spontanous Breathing  Post-op Assessment: Report given to RN and Post -op Vital signs reviewed and stable  Post vital signs: Reviewed and stable  Last Vitals:  Vitals Value Taken Time  BP 99/54 11/25/23 1345  Temp 37.1 C 11/25/23 1345  Pulse 58 11/25/23 1346  Resp 15 11/25/23 1349  SpO2 100 % 11/25/23 1346  Vitals shown include unfiled device data.  Last Pain:  Vitals:   11/25/23 1215  TempSrc:   PainSc: 3       Patients Stated Pain Goal: 2 (11/22/23 2015)  Complications: No notable events documented.

## 2023-11-25 NOTE — Progress Notes (Signed)
 After the Pt antibiotic was finished the IV cath was hanging out of the Pt's arm, I flushed the IV line prior to admin and there were no issues. The patient stated " she was messing with it because she wanted it out"

## 2023-11-26 DIAGNOSIS — L03116 Cellulitis of left lower limb: Secondary | ICD-10-CM | POA: Diagnosis not present

## 2023-11-26 LAB — CULTURE, BLOOD (ROUTINE X 2): Culture: NO GROWTH

## 2023-11-26 LAB — GLUCOSE, CAPILLARY
Glucose-Capillary: 86 mg/dL (ref 70–99)
Glucose-Capillary: 97 mg/dL (ref 70–99)

## 2023-11-26 MED ORDER — JUVEN PO PACK
1.0000 | PACK | Freq: Two times a day (BID) | ORAL | Status: DC
  Administered 2023-11-30 – 2023-12-06 (×4): 1 via ORAL
  Filled 2023-11-26 (×9): qty 1

## 2023-11-26 MED ORDER — ZINC SULFATE 220 (50 ZN) MG PO CAPS
220.0000 mg | ORAL_CAPSULE | Freq: Every day | ORAL | Status: DC
  Administered 2023-11-29 – 2023-12-04 (×6): 220 mg via ORAL
  Filled 2023-11-26 (×10): qty 1

## 2023-11-26 MED ORDER — HYDROMORPHONE HCL 1 MG/ML IJ SOLN
0.5000 mg | INTRAMUSCULAR | Status: DC | PRN
  Administered 2023-11-28 – 2023-11-29 (×3): 1 mg via INTRAVENOUS
  Filled 2023-11-26 (×2): qty 1

## 2023-11-26 MED ORDER — HYDRALAZINE HCL 50 MG PO TABS: 50.0000 mg | ORAL_TABLET | Freq: Three times a day (TID) | ORAL | Status: DC

## 2023-11-26 MED ORDER — VITAMIN C 500 MG PO TABS
1000.0000 mg | ORAL_TABLET | Freq: Every day | ORAL | Status: DC
  Administered 2023-11-29 – 2023-12-05 (×7): 1000 mg via ORAL
  Filled 2023-11-26 (×10): qty 2

## 2023-11-26 MED ORDER — SODIUM CHLORIDE 0.9 % IV SOLN: INTRAVENOUS | Status: DC

## 2023-11-26 MED ORDER — OXYCODONE HCL 5 MG PO TABS
5.0000 mg | ORAL_TABLET | ORAL | Status: DC | PRN
  Administered 2023-11-28 – 2023-12-04 (×4): 5 mg via ORAL
  Filled 2023-11-26: qty 1
  Filled 2023-11-26 (×2): qty 2
  Filled 2023-11-26: qty 1
  Filled 2023-11-26 (×3): qty 2
  Filled 2023-11-26: qty 1
  Filled 2023-11-26: qty 2
  Filled 2023-11-26: qty 1

## 2023-11-26 MED ORDER — OXYCODONE HCL 5 MG PO TABS
10.0000 mg | ORAL_TABLET | ORAL | Status: DC | PRN
  Administered 2023-11-26 – 2023-12-03 (×5): 10 mg via ORAL
  Administered 2023-12-03 – 2023-12-04 (×2): 15 mg via ORAL
  Filled 2023-11-26 (×2): qty 3
  Filled 2023-11-26: qty 2
  Filled 2023-11-26: qty 3

## 2023-11-26 MED ORDER — VANCOMYCIN VARIABLE DOSE PER UNSTABLE RENAL FUNCTION (PHARMACIST DOSING): Status: DC

## 2023-11-26 MED ORDER — AMLODIPINE BESYLATE 5 MG PO TABS
5.0000 mg | ORAL_TABLET | Freq: Every evening | ORAL | Status: DC
  Administered 2023-11-26 – 2023-12-06 (×10): 5 mg via ORAL
  Filled 2023-11-26 (×10): qty 1

## 2023-11-26 MED ORDER — AMLODIPINE BESYLATE 5 MG PO TABS: 5.0000 mg | ORAL_TABLET | Freq: Every day | ORAL | Status: DC

## 2023-11-26 NOTE — Progress Notes (Signed)
 Upon assessment this morning. Patient very tired and able to answer questions but refusing to answer them. Refused blood draw and CBG testing as well as refused morning medications.

## 2023-11-26 NOTE — Progress Notes (Signed)
 Patient not answering most questions today. Will open eyes and answer few questions but keeps saying "same as before". Patient knows her name and birthday and that she's in the hospital but not answering other questions. In pain but doesn't want to take anything. Refusing bath, labs, CBG testing, medications, doesn't want to eat. MD Seferino Dade, and Foster notifed.

## 2023-11-26 NOTE — Evaluation (Signed)
 Occupational Therapy Evaluation Patient Details Name: [name] [name] MRN: [id number] DOB: [birth year] Today's Date: 11/26/2023   History of Present Illness   Pt is a 58 y.o. female who presented 11/21/23 from SNF with L heel wound. MRI showed  abnormal edema posteriorly and medially in the calcaneus compatible with osteomyelitis, marrow edema in the talar neck and adjacent talar body potentially from stress fracture or osteomyelitis, and stress fracture of the proximal metaphysis of the first metatarsal with associated low-grade edema throughout the shaft of the first metatarsal. 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 PMH: ESRD on HD, syncope and collapse, diabetic neuropathy, mitral regurgitation, HTN, heart murmur, colitis, and DM2, L eye blindness.     Clinical Impressions PTA, pt is a LTC resident at [facility], uses hoyer lift for any OOB transfers and receives bed level assist for bathing, dressing and toileting. Pt presents today with decreased responsiveness, limited verbalizations and declining any EOB attempts. Pt requiring Max A to roll in bed and requires Max-Total A for most ADLs bed level. Due to decreased engagement, inquired to pt if she would like therapy to continue working with her here to which she responded "yes". Will follow distantly at an acute level and recommend return to her rehab facility when medically cleared.      If plan is discharge home, recommend the following:   A lot of help with walking and/or transfers;Two people to help with walking and/or transfers;A lot of help with bathing/dressing/bathroom;Two people to help with bathing/dressing/bathroom;Assistance with feeding     Functional Status Assessment   Patient has had a recent decline in their functional status and demonstrates the ability to make significant improvements in function in a reasonable and predictable amount of time.     Equipment Recommendations   None recommended by OT     Recommendations for Other  Services         Precautions/Restrictions   Precautions Precautions: Fall;Other (comment) Precaution/Restrictions Comments: L eye blind, limited vision R eye, sacral wound, LLE wound vac Restrictions Weight Bearing Restrictions Per Provider Order: Yes LLE Weight Bearing Per Provider Order: Non weight bearing     Mobility Bed Mobility Overal bed mobility: Needs Assistance Bed Mobility: Rolling Rolling: Max assist, Used rails         General bed mobility comments: did reach across when cued but Max A needed to roll to L side for alternating pillow at hips for pressure relief. Pt adamantly declined EOB attempts    Transfers                   General transfer comment: pt declined OOB attempts, uses hoyer at baseline      Balance                                           ADL either performed or assessed with clinical judgement   ADL Overall ADL's : Needs assistance/impaired Eating/Feeding: Minimal assistance;Bed level   Grooming: Minimal assistance;Bed level Grooming Details (indicate cue type and reason): able to wash face with setup bed level, anticipate some assist with bimanual tasks Upper Body Bathing: Maximal assistance;Bed level   Lower Body Bathing: Total assistance;Bed level   Upper Body Dressing : Maximal assistance;Bed level   Lower Body Dressing: Total assistance;Bed level       Toileting- Clothing Manipulation and Hygiene: Total assistance;Bed level  Vision Baseline Vision/History: 2 Legally blind Ability to See in Adequate Light: 2 Moderately impaired Patient Visual Report: No change from baseline Vision Assessment?: Vision impaired- to be further tested in functional context Additional Comments: baseline visual deficits. R vision > L vision     Perception         Praxis         Pertinent Vitals/Pain Pain Assessment Pain Assessment: Faces Faces Pain Scale: Hurts even more Pain Location: L  residual limb with movement, bottom and peri region Pain Descriptors / Indicators: Discomfort, Guarding, Grimacing, Sore Pain Intervention(s): Monitored during session, Limited activity within patient's tolerance, Other (comment) (RN present and aware)     Extremity/Trunk Assessment Upper Extremity Assessment Upper Extremity Assessment: Generalized weakness;Right hand dominant RUE Deficits / Details: difficulty flexing R shoulder compared to L, achieving ~100-110' shoulder flexion with extra time and effort   Lower Extremity Assessment Lower Extremity Assessment: Defer to PT evaluation RLE Deficits / Details: MMT scores grossly </= 3-/5, weaker distally than proximally; reports she can feel touch at feet; barely wiggles toes/foot when cued LLE Deficits / Details: MMT scores grossly </= 2+/5, weaker distally than proximally; reports she can feel touch at feet; barely wiggles toes/foot when cued; wound at L heel   Cervical / Trunk Assessment Cervical / Trunk Assessment: Normal Cervical / Trunk Exceptions: increased body habitus   Communication Communication Communication: Impaired Factors Affecting Communication: Difficulty expressing self;Reduced clarity of speech   Cognition Arousal: Alert Behavior During Therapy: Flat affect Cognition: Difficult to assess Difficult to assess due to: Level of arousal           OT - Cognition Comments: flat affect, limited verbalizations (different from initial admission per familiar staff). resistant to participate or engage                 Following commands: Impaired Following commands impaired: Follows one step commands inconsistently, Follows one step commands with increased time     Cueing  General Comments   Cueing Techniques: Verbal cues;Tactile cues  Rn present assessing pt, noted BP 170s/60s and pt declining BP meds. Also pt with discomfort in peri region w/ some skin integrity concerns noted. Discussed benefits of air  mattress if pt remains admitted. R prevalon boot intact   Exercises     Shoulder Instructions      Home Living Family/patient expects to be discharged to:: Skilled nursing facility                                 Additional Comments: LTC resident at [facility]      Prior Functioning/Environment Prior Level of Function : Needs assist             Mobility Comments: Has not stood up or walked in months, has primarily been bedbound at SNF, reporting she does not remember that last time she was OOB but she had been transferring via hoyer lift recently ADLs Comments: anticipate bed level assist for all bathing, dressing and toileting    OT Problem List: Decreased strength;Decreased activity tolerance;Impaired balance (sitting and/or standing);Decreased cognition;Decreased safety awareness;Impaired vision/perception;Decreased knowledge of use of DME or AE;Pain   OT Treatment/Interventions: Self-care/ADL training;Therapeutic exercise;Energy conservation;DME and/or AE instruction;Therapeutic activities;Patient/family education;Balance training      OT Goals(Current goals can be found in the care plan section)   Acute Rehab OT Goals Patient Stated Goal: none stated OT Goal Formulation: With patient Time  For Goal Achievement: 12/10/23 Potential to Achieve Goals: Fair   OT Frequency:  Min 1X/week    Co-evaluation PT/OT/SLP Co-Evaluation/Treatment: Yes Reason for Co-Treatment: For patient/therapist safety;To address functional/ADL transfers   OT goals addressed during session: ADL's and self-care      AM-PAC OT "6 Clicks" Daily Activity     Outcome Measure Help from another person eating meals?: A Little Help from another person taking care of personal grooming?: A Little Help from another person toileting, which includes using toliet, bedpan, or urinal?: Total Help from another person bathing (including washing, rinsing, drying)?: A Lot Help from another  person to put on and taking off regular upper body clothing?: A Lot Help from another person to put on and taking off regular lower body clothing?: Total 6 Click Score: 12   End of Session Nurse Communication: Mobility status  Activity Tolerance: Patient limited by lethargy Patient left: in bed;with call bell/phone within reach;with bed alarm set  OT Visit Diagnosis: Other abnormalities of gait and mobility (R26.89);Muscle weakness (generalized) (M62.81);Other symptoms and signs involving cognitive function                Time: 0865-7846 OT Time Calculation (min): 17 min Charges:  OT General Charges $OT Visit: 1 Visit OT Evaluation $OT Eval Low Complexity: 1 Low  Lawrence Pretty, OTR/L Acute Rehab Services Office: [phone]   Shireen Dory 11/26/2023, 2:12 PM

## 2023-11-26 NOTE — Evaluation (Signed)
 Physical Therapy Re-Evaluation Patient Details Name: [name] [name] MRN: [id number] DOB: 1965-08-11 Today's Date: 11/26/2023  History of Present Illness  Pt is a 58 y.o. female who presented 11/21/23 from SNF with L heel wound. MRI showed  abnormal edema posteriorly and medially in the calcaneus compatible with osteomyelitis, marrow edema in the talar neck and adjacent talar body potentially from stress fracture or osteomyelitis, and stress fracture of the proximal metaphysis of the first metatarsal with associated low-grade edema throughout the shaft of the first metatarsal. 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 PMH: ESRD on HD, syncope and collapse, diabetic neuropathy, mitral regurgitation, HTN, heart murmur, colitis, and DM2, L eye blindness.   Clinical Impression  The pt is now s/p L BKA 5/23. She continues to decline attempts at sitting EOB, but she was much less talkative and verbally responsive this date than at initial eval. She also primarily maintained a forward, blank gaze with a flat affect this date. She could track therapist when desired though. When trying to understand why her mood/cognition has changed, pt would say "same" repeatedly and not elaborate. She denied feeling depressed though. RN aware and present throughout evaluation. Due to her impaired cognition and poor attention and command following this date, the assessment of the lower extremities was difficult to perform or get an accurate representation. She does display L knee flexion ROM deficits and gross weakness bil. While she declined EOB attempts, she was able to roll to the L with maxA for pressure relief purposes. Discussed with the RN a need for an air mattress for the pt if she plans to remain admitted to the hospital due to pressure wounds and skin integrity concerns. Will continue to follow acutely to assist in ROM and strengthening of the now L residual limb and try to promote increased active participation in functional mobility and pressure  relief. Pt would benefit from skilled PT at her SNF upon d/c.          If plan is discharge home, recommend the following: Two people to help with walking and/or transfers;Two people to help with bathing/dressing/bathroom;Assistance with cooking/housework;Direct supervision/assist for medications management;Direct supervision/assist for financial management;Assist for transportation;Help with stairs or ramp for entrance   Can travel by private vehicle   No    Equipment Recommendations Other (comment);Wheelchair (measurements PT);Wheelchair cushion (measurements PT);Hoyer lift;Hospital bed (air mattress overlay; roho cushion for w/c)  Recommendations for Other Services       Functional Status Assessment Patient has had a recent decline in their functional status and demonstrates the ability to make significant improvements in function in a reasonable and predictable amount of time.     Precautions / Restrictions Precautions Precautions: Fall;Other (comment) Precaution/Restrictions Comments: L eye blind, limited vision R eye, sacral wound, LLE wound vac Restrictions Weight Bearing Restrictions Per Provider Order: Yes LLE Weight Bearing Per Provider Order: Non weight bearing      Mobility  Bed Mobility Overal bed mobility: Needs Assistance Bed Mobility: Rolling Rolling: Max assist, Used rails         General bed mobility comments: did reach across when cued but Max A needed to roll to L side for alternating pillow at hips for pressure relief. Pt adamantly declined EOB attempts    Transfers                   General transfer comment: pt declined OOB attempts, uses hoyer at baseline    Ambulation/Gait  General Gait Details: N/A, nonambulatory at baseline  Stairs            Wheelchair Mobility     Tilt Bed    Modified Rankin (Stroke Patients Only)       Balance Overall balance assessment: Needs assistance                                            Pertinent Vitals/Pain Pain Assessment Pain Assessment: Faces Faces Pain Scale: Hurts even more Pain Location: L residual limb with movement, bottom and peri region Pain Descriptors / Indicators: Discomfort, Guarding, Grimacing, Sore Pain Intervention(s): Monitored during session, Limited activity within patient's tolerance    Home Living Family/patient expects to be discharged to:: Skilled nursing facility                   Additional Comments: LTC resident at [facility]    Prior Function Prior Level of Function : Needs assist             Mobility Comments: Has not stood up or walked in months, has primarily been bedbound at SNF, reporting she does not remember that last time she was OOB but she had been transferring via hoyer lift recently ADLs Comments: anticipate bed level assist for all bathing, dressing and toileting     Extremity/Trunk Assessment   Upper Extremity Assessment Upper Extremity Assessment: Defer to OT evaluation RUE Deficits / Details: difficulty flexing R shoulder compared to L, achieving ~100-110' shoulder flexion with extra time and effort    Lower Extremity Assessment Lower Extremity Assessment: Generalized weakness;RLE deficits/detail;LLE deficits/detail;Difficult to assess due to impaired cognition RLE Deficits / Details: pt minimally if at all activating muscles in R leg today as pt was less attentive and not following cues well today LLE Deficits / Details: L BKA 5/23; able to actively flex and extend L knee slightly when cued; able to achieve full knee extension AAROM; only achieves ~10' knee flexion AAROM, limited by pain    Cervical / Trunk Assessment Cervical / Trunk Assessment: Normal Cervical / Trunk Exceptions: increased body habitus  Communication   Communication Communication: Impaired Factors Affecting Communication: Difficulty expressing self;Reduced clarity of speech    Cognition  Arousal: Alert Behavior During Therapy: Flat affect   PT - Cognitive impairments: No family/caregiver present to determine baseline                       PT - Cognition Comments: Slow processing and difficulty problem solving and sequencing. Much less talkative today, keeping primarily a forward gaze but able to track and attend to therapist when she desires to. Pt repetitive in stating "same" when asked what the issue is, but  unable to ellaborate when prompted. Was not so confused the other day at initial PT Eval as she is now. less attentive and not following cues well today. Pt denies feeling depressed Following commands: Impaired Following commands impaired: Follows one step commands inconsistently, Follows one step commands with increased time     Cueing Cueing Techniques: Verbal cues, Tactile cues     General Comments General comments (skin integrity, edema, etc.): Rn present assessing pt, noted BP 170s/60s and pt declining BP meds. Also pt with discomfort in peri region w/ some skin integrity concerns noted. Discussed benefits of air mattress if pt remains admitted. R prevalon boot intact  Exercises     Assessment/Plan    PT Assessment Patient needs continued PT services  PT Problem List Decreased strength;Decreased range of motion;Decreased activity tolerance;Decreased balance;Decreased mobility;Decreased cognition;Decreased skin integrity;Pain       PT Treatment Interventions DME instruction;Functional mobility training;Therapeutic activities;Therapeutic exercise;Balance training;Neuromuscular re-education;Patient/family education;Wheelchair mobility training;Cognitive remediation    PT Goals (Current goals can be found in the Care Plan section)  Acute Rehab PT Goals Patient Stated Goal: to reduce pain PT Goal Formulation: With patient Time For Goal Achievement: 12/07/23 Potential to Achieve Goals: Fair    Frequency Min 1X/week     Co-evaluation   Reason  for Co-Treatment: For patient/therapist safety;To address functional/ADL transfers PT goals addressed during session: Strengthening/ROM;Mobility/safety with mobility OT goals addressed during session: ADL's and self-care       AM-PAC PT "6 Clicks" Mobility  Outcome Measure Help needed turning from your back to your side while in a flat bed without using bedrails?: A Lot Help needed moving from lying on your back to sitting on the side of a flat bed without using bedrails?: Total Help needed moving to and from a bed to a chair (including a wheelchair)?: Total Help needed standing up from a chair using your arms (e.g., wheelchair or bedside chair)?: Total Help needed to walk in hospital room?: Total Help needed climbing 3-5 steps with a railing? : Total 6 Click Score: 7    End of Session   Activity Tolerance: Patient limited by pain Patient left: in bed;with call bell/phone within reach;with bed alarm set Nurse Communication: Mobility status;Other (comment);Need for lift equipment (need for air mattress if remains admitted) PT Visit Diagnosis: Muscle weakness (generalized) (M62.81);Pain Pain - Right/Left:  (bil) Pain - part of body: Leg    Time: 1610-9604 PT Time Calculation (min) (ACUTE ONLY): 19 min   Charges:   PT Evaluation $PT Re-evaluation: 1 Re-eval   PT General Charges $$ ACUTE PT VISIT: 1 Visit         Vernida Goodie, PT, DPT Acute Rehabilitation Services  Office: [phone]   Ellyn Hack 11/26/2023, 2:45 PM

## 2023-11-26 NOTE — Plan of Care (Signed)
     Referral received for [name] [name] :goals of care discussion. Chart reviewed. Patient assessed. I introduced the role of palliative medicine as an extra later of support. She declines to participate in discussions today. She is open to another attempt tomorrow.   Detailed note and recommendations to follow once GOC has been completed.   Thank you for your referral and allowing PMT to assist in Mrs. [name] [name]'s care.   Landrey Mahurin, PA-C Palliative Medicine Team  Team Phone # [phone]   NO CHARGE

## 2023-11-26 NOTE — Progress Notes (Signed)
 Patient ID: [name] [name], female   DOB: Feb 20, 1966, 58 y.o.   MRN: [id number] Patient is a 58 year old woman who is postoperative day 1 left below-knee amputation.  There is no drainage in the wound VAC canister there is a good suction fit.  Plan for rehab based on therapy recommendations.

## 2023-11-26 NOTE — Progress Notes (Signed)
 [facility] Progress Note  Name: [name] [name] MRN: [id number] DOB: [birth year]  Chief Complaint:  Foot wound   Subjective:  Called by nurse as the patient has been refusing most of her blood pressure medications.  Last HD on 5/20 with 1.2 kg UF.  She took the amlodipine  10 mg dose once days ago.  Has been intermittently taking hydralazine .  More compliant with metoprolol but still missing some doses.  She does have on the clonidine  patch.  She refused labs today. She had a left BKA yesterday - I missed her on my attempt to see her yesterday.    Review of systems:     Denies shortness of breath or chest pain  Denies n/v   ------------------ Background on consult:  [name] [name] is a 58 y.o. female with PMH of ESRD, GERD, HTN, left eye blindness, T2DM, hx of lumbar radiculopathy. She was brought to the hospital for worsening left heel wound. She has a diabetic foot ulcer around her right heel. ED workup showed infection of the wound and cellulitis. No sign of osteo. Patient also had hgb of 6.1 with no clear source of bleeding. She is from [facility].   Patient recently transferred from an outside practice to [facility] and dialyzes on TTS.  She has been on HD for 2 years. We were consulted to help coordinate HD while she is in the hospital.     Intake/Output Summary (Last 24 hours) at 11/26/2023 1505 Last data filed at 11/26/2023 0237 Gross per 24 hour  Intake 468.95 ml  Output 0 ml  Net 468.95 ml    Vitals:  Vitals:   11/26/23 0447 11/26/23 0900 11/26/23 1336 11/26/23 1400  BP: 137/63 137/63 (!) 171/64 (!) 166/64  Pulse: (!) 57 (!) 56 68 65  Resp: 14 14 16    Temp: 98.4 F (36.9 C) 98.1 F (36.7 C) 99.5 F (37.5 C)   TempSrc: Oral Oral Oral   SpO2: 100% 99% 100%   Weight:      Height:         Physical Exam:    General adult female in bed in no acute distress HEENT normocephalic atraumatic extraocular movements intact sclera anicteric Neck supple  trachea midline Lungs clear to auscultation bilaterally normal work of breathing at rest  Heart S1S2 no rub Abdomen soft nontender nondistended Extremities left BKA with a wound vac; trace RLE edema Psych mostly flat affect; no anxiety or agitation Neuro alert and oriented; provides basic history and follows commands  Access Left internal jugular tunn catheter   Medications reviewed   Labs:     Latest Ref Rng & Units 11/25/2023    7:59 AM 11/24/2023    6:24 AM 11/23/2023    6:31 AM  BMP  Glucose 70 - 99 mg/dL 841  324  401   BUN 6 - 20 mg/dL 13  20  10    Creatinine 0.44 - 1.00 mg/dL 0.27  2.53  6.64   Sodium 135 - 145 mmol/L 134  133  133   Potassium 3.5 - 5.1 mmol/L 3.9  4.1  3.9   Chloride 98 - 111 mmol/L 96  97  98   CO2 22 - 32 mmol/L 24  24  24    Calcium  8.9 - 10.3 mg/dL 8.0  7.5  8.0    Dialysis Orders:  GO TTS 3.75 hours  Heparin  5000 units BFR 350/ DFR 600 3K/2.5 Ca bath EDW 64 kg TDC Mircera 225 mcg  q 2 weeks. Last dose 11/17/23 Calcitriol 0.25 mcg q HD  Assessment/Plan:   Left Foot wound - abx and imaging per primary team    ESRD - HD per TTS schedule. Refused labs today     HTN -  - note on an atypical regimen outpatient including high dose clonidine  patch.  And taking multiple meds on non-HD days - Note we reordered her clonidine  patch at a dose of 0.1 mg for now (the 0.2 mg patch was never applied) - reduce amlodipine  to 5 mg nightly - stop hydralazine  - continue metoprolol for now    Anemia of CKD - on max ESA outpt, recently dosed on 5/15.  PRBC's per primary team    Metabolic bone disease - hypophosphatemia.  Defer binder. On calcitriol    Disposition - per primary team   Nan Aver, MD 11/26/2023 3:19 PM

## 2023-11-26 NOTE — Significant Event (Signed)
 Rapid Response Event Note   Reason for Call :  Asked to see pt as a "second set of eyes" d/t decreased MS.   Initial Focused Assessment:  Pt lying in bed with eyes open, in no visibile distress. Pt is alert and oriented x4, her affect is flat. Pt is irritable. She will answer some questions, but only intermittently.  She says she doesn't want to talk or answer questions. She moves all extremities. Pt refuses to allow pupils to be checked with flashlight. Lungs diminished t/o. L BKA wound VAC dressing WNL.   T-98.2, HR-66, BP-130/56, RR-18, SpO2-100% on RA.   Interventions:  CBG-97  Plan of Care:  Pt exam appears to be consistent with prior exams charted by MD and RN. Her VS and CBG are stable. She is moving all extremities. Continue to monitor pt. Please call RRT if further assistance needed.   Event Summary:   Call Time:2238 Arrival [phone] End Time:2303  Dorrine Gaudy, RN

## 2023-11-26 NOTE — Progress Notes (Signed)
 PROGRESS NOTE    MONE COMMISSO  ZOX:[id number] DOB: Nov 03, 1965 DOA: 11/21/2023 PCP: Alwyn Juba., MD   Brief Narrative:  The patient 58 year old African-American female with a past medical history significant for ESRD on hemodialysis Tuesday Thursday Saturdays, GERD, essential hypertension, left eye blindness, diabetes mellitus type 2 which is fairly well-controlled, history of lumbar radiculopathy presented from her SNF from [facility] with a left heel wound and cellulitis and suspicion for possible and gas-forming infection. She has been undergoing wound care treatments but states that the foot has gotten worse. Workup in the ED shows a wound infection and cellulitis but there is concern for deeper infection so we will obtain an MRI of the heel and foot and obtain vascular ABIs. On presentation she is found to have a hemoglobin of 6.1 with no obvious source of bleeding so she is typed and screened and transfused 1 unit PRBCs. Nephrology is consulted for maintenance of hemodialysis and pending further workup may need to consult vascular surgery and orthopedic surgery for further evaluation   Assessment & Plan:   Principal Problem:   Cellulitis of left lower extremity Active Problems:   Diabetic neuropathy (HCC)   Essential hypertension   GERD (gastroesophageal reflux disease)   ESRD (end stage renal disease) (HCC)   DM2 (diabetes mellitus, type 2) (HCC)   Anemia due to chronic kidney disease   Osteomyelitis of left foot (HCC)  Infected Diabetic Foot ulcer with Cellulitis/gangrene and gas-forming infection of the left heel/osteomyelitis: The unstageable pressure heel left ulcer with a wound bed black eschar.  States that she sees the wound clinic but is not getting better.  she received Unasyn  and linezolid  in the ED and was later started on cefepime  and vancomycin .  Blood cultures x 2 were obtained and NGTD.  MRI confirms osteomyelitis.  Ortho consulted, patient seen by Dr. 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  Has wound VAC attached, suction is good per Dr. Julio Ohm.  Patient still remains on cefepime , doubt she needs that.  I have sent a secure chat message to Dr. Julio Ohm for his recommendations about antibiotics, await response at the time of dictation.   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 C/w Sensitive Novolog  SSI AC/HS. Has been refusing her Insulin  regimen.    HypoNa+: Mild, asymptomatic, improving.   Essential HTN: Blood pressure fairly controlled.  C/w Amlodipine  10 mg po Daily and Metoprolol  Tartrate 50 mg po BID.    ESRD on HD TTSat: Nephrology following.  Appreciate their help with management of this.   Symptomatic Anemia/Microcytic Anemia/Anemia of Chronic Kidney Disease: Hemoglobin 6.1 upon arrival, status post 1 unit of PRBC transfusion, hemoglobin improved to 8.4 but dropped to 7.8 today.  Monitor daily and transfuse if less than 7.  CBC pending this morning.  Patient is refusing labs.  Thrombocytosis: Improved.  DVT prophylaxis: SCD's Start: 11/26/23 0743 SCDs Start: 11/21/23 2128   Code Status: Full Code  Family Communication:  None present at bedside.  Plan of care discussed with patient in length and he/she verbalized understanding and agreed with it.  Status is: Inpatient Remains inpatient appropriate because: Status post BKA, needs evaluation by PT OT, will likely require SNF.   Estimated body mass index is 27.47 kg/m as calculated from the following:   Height as of this encounter: 5' (1.524 m).   Weight as of  this encounter: 63.8 kg.    Nutritional Assessment: Body mass index is 27.47 kg/m.Aaron Aas Seen by dietician.  I agree with the assessment and plan as outlined below: Nutrition Status:        . Skin Assessment: I have examined the patient's skin and I agree with the wound assessment as  performed by the wound care RN as outlined below:    Consultants:  Orthopedics  Procedures:  BKA Antimicrobials:  Anti-infectives (From admission, onward)    Start     Dose/Rate Route Frequency Ordered Stop   11/26/23 1200  vancomycin  (VANCOREADY) IVPB 750 mg/150 mL        750 mg 150 mL/hr over 60 Minutes Intravenous Every T-Th-Sa (Hemodialysis) 11/25/23 1015     11/25/23 1109  ceFAZolin  (ANCEF ) 2-4 GM/100ML-% IVPB       Note to Pharmacy: [name], [name]: cabinet override      11/25/23 1109 11/25/23 1303   11/25/23 0600  ceFAZolin  (ANCEF ) IVPB 2g/100 mL premix        2 g 200 mL/hr over 30 Minutes Intravenous On call to O.R. 11/25/23 0214 11/25/23 1329   11/24/23 1200  vancomycin  (VANCOREADY) IVPB 750 mg/150 mL        750 mg 150 mL/hr over 60 Minutes Intravenous Every T-Th-Sa (Hemodialysis) 11/22/23 1437 11/24/23 1253   11/23/23 1330  vancomycin  (VANCOREADY) IVPB 750 mg/150 mL        750 mg 150 mL/hr over 60 Minutes Intravenous  Once 11/23/23 1243 11/23/23 1529   11/22/23 0600  Vancomycin  (VANCOCIN ) 1,250 mg in sodium chloride  0.9 % 250 mL IVPB        1,250 mg 166.7 mL/hr over 90 Minutes Intravenous  Once 11/21/23 2122 11/22/23 0714   11/22/23 0200  piperacillin -tazobactam (ZOSYN ) IVPB 2.25 g  Status:  Discontinued        2.25 g 100 mL/hr over 30 Minutes Intravenous Every 8 hours 11/21/23 2122 11/21/23 2126   11/21/23 2230  ceFEPIme  (MAXIPIME ) 1 g in sodium chloride  0.9 % 100 mL IVPB        1 g 200 mL/hr over 30 Minutes Intravenous Every 24 hours 11/21/23 2126     11/21/23 2120  vancomycin  variable dose per unstable renal function (pharmacist dosing)  Status:  Discontinued         Does not apply See admin instructions 11/21/23 2122 11/25/23 1015   11/21/23 1600  Ampicillin -Sulbactam (UNASYN ) 3 g in sodium chloride  0.9 % 100 mL IVPB       Placed in "And" Linked Group   3 g 200 mL/hr over 30 Minutes Intravenous  Once 11/21/23 1547 11/21/23 1740   11/21/23 1600  linezolid   (ZYVOX ) IVPB 600 mg       Placed in "And" Linked Group   600 mg 300 mL/hr over 60 Minutes Intravenous  Once 11/21/23 1547 11/21/23 1808         Subjective: Patient seen and examined.  Sleepy as most of the time she remains.  Not interested in talking much.  Appears comfortable.  Per nursing, she is often refusing CBG checks and lab draws as well.  Objective: Vitals:   11/25/23 2118 11/26/23 0004 11/26/23 0447 11/26/23 0900  BP: (!) 117/59 115/65 137/63 137/63  Pulse: (!) 56 (!) 57 (!) 57 (!) 56  Resp: 16 16 14 14   Temp: 98.1 F (36.7 C) 98 F (36.7 C) 98.4 F (36.9 C) 98.1 F (36.7 C)  TempSrc: Oral Oral Oral Oral  SpO2: 100% 99% 100% 99%  Weight:      Height:        Intake/Output Summary (Last 24 hours) at 11/26/2023 1121 Last data filed at 11/26/2023 0237 Gross per 24 hour  Intake 668.95 ml  Output 0 ml  Net 668.95 ml   Filed Weights   11/23/23 0216 11/24/23 1300 11/25/23 1124  Weight: 63.8 kg 63.8 kg 63.8 kg    Examination:  General exam: Appears calm and depressed and not interested in conversations. Respiratory system: Clear to auscultation. Respiratory effort normal. Cardiovascular system: S1 & S2 heard, RRR. No JVD, murmurs, rubs, gallops or clicks. No pedal edema. Gastrointestinal system: Abdomen is nondistended, soft and nontender. No organomegaly or masses felt. Normal bowel sounds heard. Central nervous system: Sleepy but oriented. No focal neurological deficits. Extremities: Left BKA with wound VAC attached.  Data Reviewed: I have personally reviewed following labs and imaging studies  CBC: Recent Labs  Lab 11/21/23 1641 11/22/23 0049 11/22/23 0819 11/23/23 0631 11/24/23 0624 11/25/23 0759  WBC 18.8*  --  17.7* 19.8* 20.0* 19.3*  NEUTROABS 14.3*  --   --  16.0* 15.4* 15.6*  HGB 6.1* 8.4* 9.1* 8.4* 7.8* 8.4*  HCT 20.9* 28.2* 30.3* 26.7* 24.9* 27.3*  MCV 63.3*  --  65.6* 64.2* 64.7* 65.6*  PLT 422*  --  376 343 356 322   Basic Metabolic  Panel: Recent Labs  Lab 11/21/23 1641 11/22/23 0049 11/23/23 0631 11/24/23 0624 11/25/23 0759  NA 133* 132* 133* 133* 134*  K 3.6 3.6 3.9 4.1 3.9  CL 96* 95* 98 97* 96*  CO2 27 25 24 24 24   GLUCOSE 260* 311* 152* 208* 180*  BUN 22* 23* 10 20 13   CREATININE 3.85* 3.97* 2.14* 3.10* 2.23*  CALCIUM  7.8* 8.0* 8.0* 7.5* 8.0*  PHOS  --  2.1* 1.2*  --  2.3*   GFR: Estimated Creatinine Clearance: 23.2 mL/min (A) (by C-G formula based on SCr of 2.23 mg/dL (H)). Liver Function Tests: Recent Labs  Lab 11/21/23 1641 11/22/23 0049 11/23/23 0631 11/24/23 0624 11/25/23 0759  AST 14*  --   --  15  --   ALT 7  --   --  9  --   ALKPHOS 116  --   --  92  --   BILITOT 0.6  --   --  0.7  --   PROT 6.6  --   --  6.3*  --   ALBUMIN  2.5* 2.5* 2.1* 2.0* 2.1*   No results for input(s): "LIPASE", "AMYLASE" in the last 168 hours. No results for input(s): "AMMONIA" in the last 168 hours. Coagulation Profile: No results for input(s): "INR", "PROTIME" in the last 168 hours. Cardiac Enzymes: No results for input(s): "CKTOTAL", "CKMB", "CKMBINDEX", "TROPONINI" in the last 168 hours. BNP (last 3 results) No results for input(s): "PROBNP" in the last 8760 hours. HbA1C: No results for input(s): "HGBA1C" in the last 72 hours.  CBG: Recent Labs  Lab 11/25/23 0758 11/25/23 1136 11/25/23 1346 11/25/23 1558  GLUCAP 179* 184* 148* 116*   Lipid Profile: No results for input(s): "CHOL", "HDL", "LDLCALC", "TRIG", "CHOLHDL", "LDLDIRECT" in the last 72 hours. Thyroid  Function Tests: No results for input(s): "TSH", "T4TOTAL", "FREET4", "T3FREE", "THYROIDAB" in the last 72 hours. Anemia Panel: No results for input(s): "VITAMINB12", "FOLATE", "FERRITIN", "TIBC", "IRON ", "RETICCTPCT" in the last 72 hours. Sepsis Labs: Recent Labs  Lab 11/21/23 1647  LATICACIDVEN 0.9    Recent Results (from the past 240 hours)  Blood Cultures x 2 sites  Status: None   Collection Time: 11/21/23  4:41 PM    Specimen: BLOOD LEFT ARM  Result Value Ref Range Status   Specimen Description   Final    BLOOD LEFT ARM Performed at [facility], [address] [place], [place] [address]    Special Requests   Final    BOTTLES DRAWN AEROBIC AND ANAEROBIC Blood Culture results may not be optimal due to an inadequate volume of blood received in culture bottles Performed at [facility], [address], [place], [place] [address]    Culture   Final    NO GROWTH 5 DAYS Performed at [facility], [address] [place], [place] [address]    Report Status 11/26/2023 FINAL  Final  Culture, blood (Routine X 2) w Reflex to ID Panel     Status: None (Preliminary result)   Collection Time: 11/22/23 12:48 AM   Specimen: BLOOD  Result Value Ref Range Status   Specimen Description BLOOD RIGHT ANTECUBITAL  Final   Special Requests   Final    BOTTLES DRAWN AEROBIC AND ANAEROBIC Blood Culture adequate volume   Culture   Final    NO GROWTH 4 DAYS Performed at [facility], [address] [place], [place] [address]    Report Status PENDING  Incomplete  Surgical pcr screen     Status: None   Collection Time: 11/25/23 11:31 AM   Specimen: Nasal Mucosa; Nasal Swab  Result Value Ref Range Status   MRSA, PCR NEGATIVE NEGATIVE Final   Staphylococcus aureus NEGATIVE NEGATIVE Final    Comment: (NOTE) The Xpert SA Assay (FDA approved for NASAL specimens in patients 25 years of age and older), is one component of a comprehensive surveillance program. It is not intended to diagnose infection nor to guide or monitor treatment. Performed at [facility], [address] [place], [place] [address]      Radiology Studies: No results found.   Scheduled Meds:  sodium chloride    Intravenous Once   amLODipine   10 mg Oral Daily   vitamin C  1,000 mg Oral Daily   calcitRIOL  0.25 mcg Oral Q T,Th,Sa-HD   calcium  carbonate  1 tablet Oral TID WC   Chlorhexidine  Gluconate  Cloth  6 each Topical Daily   Chlorhexidine  Gluconate Cloth  6 each Topical Q0600   cloNIDine   0.1 mg Transdermal Weekly   hydrALAZINE   100 mg Oral Q8H   insulin  aspart  0-5 Units Subcutaneous QHS   insulin  aspart  0-6 Units Subcutaneous TID WC   metoprolol tartrate  50 mg Oral BID   nutrition supplement (JUVEN)  1 packet Oral BID BM   sodium chloride  flush  10-40 mL Intracatheter Q12H   zinc sulfate (50mg  elemental zinc)  220 mg Oral Daily   Continuous Infusions:  sodium chloride      ceFEPime  (MAXIPIME ) IV 200 mL/hr at 11/25/23 1731   vancomycin        LOS: 5 days   Modena Andes, MD [facility]  11/26/2023, 11:21 AM   *Please note that this is a verbal dictation therefore any spelling or grammatical errors are due to the "Dragon [employer] One" system interpretation.  Please page via Amion and do not message via secure chat for urgent patient care matters. Secure chat can be used for non urgent patient care matters.  How to contact the [facility] Attending or Consulting provider 7A - 7P or covering provider during after hours 7P -  7A, for this patient?  Check the care team in [facility] and look for a) attending/consulting [facility] provider listed and b) the [facility] team listed. Page or secure chat 7A-7P. Log into [email or web address] and use [facility] universal password to access. If you do not have the password, please contact the hospital operator. Locate the [facility] provider you are looking for under [facility] and page to a number that you can be directly reached. If you still have difficulty reaching the provider, please page the [facility] (Director on Call) for the Hospitalists listed on amion for assistance.

## 2023-11-26 NOTE — Plan of Care (Signed)

## 2023-11-27 DIAGNOSIS — L03116 Cellulitis of left lower limb: Secondary | ICD-10-CM | POA: Diagnosis not present

## 2023-11-27 LAB — CBC
HCT: 20.2 % — ABNORMAL LOW (ref 36.0–46.0)
Hemoglobin: 6 g/dL — CL (ref 12.0–15.0)
MCH: 19.4 pg — ABNORMAL LOW (ref 26.0–34.0)
MCHC: 29.7 g/dL — ABNORMAL LOW (ref 30.0–36.0)
MCV: 65.4 fL — ABNORMAL LOW (ref 80.0–100.0)
Platelets: 356 10*3/uL (ref 150–400)
RBC: 3.09 MIL/uL — ABNORMAL LOW (ref 3.87–5.11)
RDW: 25.3 % — ABNORMAL HIGH (ref 11.5–15.5)
WBC: 15 10*3/uL — ABNORMAL HIGH (ref 4.0–10.5)
nRBC: 0 % (ref 0.0–0.2)

## 2023-11-27 LAB — GLUCOSE, CAPILLARY
Glucose-Capillary: 73 mg/dL (ref 70–99)
Glucose-Capillary: 73 mg/dL (ref 70–99)
Glucose-Capillary: 69 mg/dL — ABNORMAL LOW (ref 70–99)
Glucose-Capillary: 114 mg/dL — ABNORMAL HIGH (ref 70–99)

## 2023-11-27 LAB — RENAL FUNCTION PANEL
Albumin: 2 g/dL — ABNORMAL LOW (ref 3.5–5.0)
Anion gap: 15 (ref 5–15)
BUN: 26 mg/dL — ABNORMAL HIGH (ref 6–20)
CO2: 22 mmol/L (ref 22–32)
Calcium: 7.4 mg/dL — ABNORMAL LOW (ref 8.9–10.3)
Chloride: 95 mmol/L — ABNORMAL LOW (ref 98–111)
Creatinine, Ser: 3.88 mg/dL — ABNORMAL HIGH (ref 0.44–1.00)
GFR, Estimated: 13 mL/min — ABNORMAL LOW (ref >60)
Glucose, Bld: 80 mg/dL (ref 70–99)
Phosphorus: 3.5 mg/dL (ref 2.5–4.6)
Potassium: 4.4 mmol/L (ref 3.5–5.1)
Sodium: 132 mmol/L — ABNORMAL LOW (ref 135–145)

## 2023-11-27 LAB — CULTURE, BLOOD (ROUTINE X 2)
Culture: NO GROWTH
Special Requests: ADEQUATE

## 2023-11-27 LAB — PREPARE RBC (CROSSMATCH)

## 2023-11-27 MED ORDER — DEXTROSE 50 % IV SOLN
12.5000 g | INTRAVENOUS | Status: AC
  Administered 2023-11-27: 12.5 g via INTRAVENOUS
  Filled 2023-11-27: qty 50

## 2023-11-27 MED ORDER — VANCOMYCIN HCL 750 MG/150ML IV SOLN: 750.0000 mg | INTRAVENOUS | Status: DC

## 2023-11-27 MED ORDER — VANCOMYCIN HCL 750 MG/150ML IV SOLN
750.0000 mg | Freq: Once | INTRAVENOUS | Status: AC
  Administered 2023-11-27: 750 mg via INTRAVENOUS
  Filled 2023-11-27 (×2): qty 150

## 2023-11-27 MED ORDER — SODIUM CHLORIDE 0.9% IV SOLUTION: Freq: Once | INTRAVENOUS | Status: DC

## 2023-11-27 MED ORDER — HYDRALAZINE HCL 20 MG/ML IJ SOLN
10.0000 mg | Freq: Four times a day (QID) | INTRAMUSCULAR | Status: DC | PRN
  Administered 2023-11-27 – 2023-11-28 (×2): 10 mg via INTRAVENOUS
  Filled 2023-11-27 (×2): qty 1

## 2023-11-27 MED ORDER — DARBEPOETIN ALFA 200 MCG/0.4ML IJ SOSY: 200.0000 ug | PREFILLED_SYRINGE | INTRAMUSCULAR | Status: DC

## 2023-11-27 MED ORDER — HEPARIN SODIUM (PORCINE) 1000 UNIT/ML IJ SOLN
INTRAMUSCULAR | Status: AC
  Filled 2023-11-27: qty 4

## 2023-11-27 MED ORDER — VANCOMYCIN HCL IN DEXTROSE 1-5 GM/200ML-% IV SOLN
INTRAVENOUS | Status: AC
  Filled 2023-11-27: qty 200

## 2023-11-27 NOTE — Progress Notes (Signed)
 Pt declined medications throughout the night, MD made aware. I called rapid to come lay eyes on her. She wasn't speak or following any commands, there wasn't any type movement when asked to move toes on right foot. Rapid came and accessed and she felt as if the Pt was ignoring us  versus anything else. Told me to call her back if there was any changes.

## 2023-11-27 NOTE — Plan of Care (Signed)
   Problem: Health Behavior/Discharge Planning: Goal: Ability to manage health-related needs will improve Outcome: Progressing

## 2023-11-27 NOTE — Plan of Care (Signed)

## 2023-11-27 NOTE — Progress Notes (Signed)
 Patient again today refuses labs draws, vital signs, medications, hygiene, turns. She refuses to answer questions as well. Patients entire team is aware of this. Patient came back from dialysis and said she wanted pain meds but then refused to take them. Blood sugar was 73 but refused to drink or eat anything. If falls below 70 will initiate hypoglycemic protocol. Attempted to have patient talk to husband but she wasn't willing to talk much.

## 2023-11-27 NOTE — Progress Notes (Signed)
 [facility] Progress Note  Name: [name] [name] MRN: [id number] DOB: October 12, 1965  Chief Complaint:  Foot wound   Subjective:  She has refused medications and labs intermittently and has refused to talk to palliative care about goals of care on multiple attempts - appreciate their assistance.  Seen and examined on dialysis.  Blood pressure 130/59 and HR 65.  Tolerating goal.  Procedure supervised.  Left internal jugular tunn catheter in use.  She opened her eyes briefly and nodded or shook her head but did not speak to me today.  We were just notified of critical Hb of 6.0 and ordered PRBC's.  Her HD treatment was rolled over to today off schedule due to patient volumes  Review of systems:      Denies shortness of breath or chest pain  Denies n/v   ------------------ Background on consult:  [name] [name] is a 58 y.o. female with PMH of ESRD, GERD, HTN, left eye blindness, T2DM, hx of lumbar radiculopathy. She was brought to the hospital for worsening left heel wound. She has a diabetic foot ulcer around her right heel. ED workup showed infection of the wound and cellulitis. No sign of osteo. Patient also had hgb of 6.1 with no clear source of bleeding. She is from [facility].   Patient recently transferred from an outside practice to [facility] and dialyzes on TTS.  She has been on HD for 2 years. We were consulted to help coordinate HD while she is in the hospital.    No intake or output data in the 24 hours ending 11/27/23 1141   Vitals:  Vitals:   11/27/23 0402 11/27/23 0403 11/27/23 0959 11/27/23 1110  BP: (!) 143/55  (!) 162/68 (!) 156/58  Pulse: 66  68 67  Resp: 17  16 20   Temp: 98.8 F (37.1 C)  (P) 98.9 F (37.2 C) 98.9 F (37.2 C)  TempSrc: Oral  (P) Axillary   SpO2: 100%  98% 100%  Weight:  64.7 kg  60.2 kg  Height:         Physical Exam:     General adult female in bed in no acute distress HEENT normocephalic atraumatic extraocular movements  intact sclera anicteric Neck supple trachea midline Lungs clear to auscultation bilaterally normal work of breathing at rest  Heart S1S2 no rub Abdomen soft nontender nondistended Extremities left BKA with a wound vac; trace RLE edema Psych mostly flat affect; no anxiety or agitation Neuro opens her eyes intermittently; nods or shakes head but does not speak today  Access Left internal jugular tunn catheter   Medications reviewed   Labs:     Latest Ref Rng & Units 11/25/2023    7:59 AM 11/24/2023    6:24 AM 11/23/2023    6:31 AM  BMP  Glucose 70 - 99 mg/dL 119  147  829   BUN 6 - 20 mg/dL 13  20  10    Creatinine 0.44 - 1.00 mg/dL 5.62  1.30  8.65   Sodium 135 - 145 mmol/L 134  133  133   Potassium 3.5 - 5.1 mmol/L 3.9  4.1  3.9   Chloride 98 - 111 mmol/L 96  97  98   CO2 22 - 32 mmol/L 24  24  24    Calcium  8.9 - 10.3 mg/dL 8.0  7.5  8.0    Dialysis Orders:  GO TTS 3.75 hours  Heparin  5000 units BFR 350/ DFR 600 3K/2.5 Ca bath EDW  64 kg TDC Mircera 225 mcg q 2 weeks. Last dose 11/17/23 Calcitriol 0.25 mcg q HD  Assessment/Plan:   Left Foot wound - abx and imaging per primary team    ESRD  - HD per TTS schedule usually. HD today off schedule then back to TTS   - She refused labs on 5/24; today's labs are pending - Note that her EDW will need to decrease as she has had a BKA (also has a wound vac)   HTN -  - note on an atypical regimen outpatient including high dose clonidine  patch.  And taking multiple meds on non-HD days - Note we reordered her clonidine  patch at a dose of 0.1 mg for now (the 0.2 mg patch was never applied) - I have reduce amlodipine  to 5 mg nightly - I have stopped hydralazine  - continue metoprolol for now - however note that she has still refused metoprolol intermittently   Anemia of CKD  - on max ESA outpt, recently dosed on 5/15.    - 2 units PRBC's today for critical Hb 6.0 - Will plan for Aranesp  200 mcg on 5/29 if still here and then weekly  on Thursdays.    Metabolic bone disease - hypophosphatemia.  Defer binder. On calcitriol    Disposition - per primary team.  Palliative care was appropriately consulted for goals of care discussions and the patient refused to participate or to allow her spouse to be contacted re: the same per charting.     Nan Aver, MD 11/27/2023 12:05 PM

## 2023-11-27 NOTE — Plan of Care (Signed)
  Daily Progress Note   Patient Name: [name] [name]       Date: 11/27/2023 DOB: 09-24-1965  Age: 58 y.o. MRN#: [id number] Attending Physician: Modena Andes, MD Primary Care Physician: Alwyn Juba., MD Admit Date: 11/21/2023 Length of Stay: 6 days  Discussed care with primary hospitalist, RN, nephrologist and orthopedic surgeon.   Chart reviewed and patient assessed with RN present. She refuses to engage in GOC discussions despite encouragement. Per RN, she was just speaking prior to my arrival. Offered to call her husband to participate in a discussion but she does not respond to the suggestion either. After several attempts, she tells this PA "no" she does not want to talk and "no" she does not want me to return again tomorrow. I shared that I would recommend outpatient palliative care follow up at her facility if she is agreeable and politely excused myself at her request.  As patient has declines to speak with PMT on multiple occasions and has declined another attempt to visit tomorrow, palliative medicine team will sign off. Please reach out if our team can be of further assistance in the future. Thank you for involving our team in patient's care.    Marionna Gonia, PA-C Palliative Care Provider PMT # [phone]  No Charge Note

## 2023-11-27 NOTE — Consult Note (Signed)
 WOC team consulted for worsening coccyx wound.  Secure chat to primary team requesting photo documentation of wound be uploaded for consultation.   Please note that the [facility] nursing team is utilizing a standardized work plan to manage patient consults. We are triaging consults and will try to see the patients within 48 hours. Wound photos in the patient's chart allow us  to consult on the patient in the most efficient and timely manner.    Thank you,    Ronni Colace MSN, RN-BC, [employer] [phone]

## 2023-11-27 NOTE — Progress Notes (Signed)
 PROGRESS NOTE    [name] [name]  ZOX:[id number] DOB: 07/10/1965 DOA: 11/21/2023 PCP: [name] [name]., MD   Brief Narrative:  The patient 58 year old African-American female with a past medical history significant for ESRD on hemodialysis Tuesday Thursday Saturdays, GERD, essential hypertension, left eye blindness, diabetes mellitus type 2 which is fairly well-controlled, history of lumbar radiculopathy presented from her SNF from [facility] with a left heel wound and cellulitis and suspicion for possible and gas-forming infection. She has been undergoing wound care treatments but states that the foot has gotten worse. Workup in the ED shows a wound infection and cellulitis but there is concern for deeper infection so we will obtain an MRI of the heel and foot and obtain vascular ABIs. On presentation she is found to have a hemoglobin of 6.1 with no obvious source of bleeding so she is typed and screened and transfused 1 unit PRBCs. Nephrology is consulted for maintenance of hemodialysis and pending further workup may need to consult vascular surgery and orthopedic surgery for further evaluation   Assessment & Plan:   Principal Problem:   Cellulitis of left lower extremity Active Problems:   Diabetic neuropathy (HCC)   Essential hypertension   GERD (gastroesophageal reflux disease)   ESRD (end stage renal disease) (HCC)   DM2 (diabetes mellitus, type 2) (HCC)   Anemia due to chronic kidney disease   Osteomyelitis of left foot (HCC)  Infected Diabetic Foot ulcer with Cellulitis/gangrene and gas-forming infection of the left heel/osteomyelitis: The unstageable pressure heel left ulcer with a wound bed black eschar.  States that she sees the wound clinic but is not getting better.  she received Unasyn  and linezolid  in the ED and was later started on cefepime  and vancomycin .  Blood cultures x 2 were obtained and NGTD.  MRI confirms osteomyelitis.  Ortho consulted, patient seen by Dr. 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  Has wound VAC attached, suction is good per Dr. Julio [name].  Patient still remains on cefepime , antibiotics duration per Dr. Julio [name].   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 C/w Sensitive Novolog  SSI AC/HS. Has been refusing her Insulin  regimen.    HypoNa+: Mild, asymptomatic, improving.   Essential HTN: Blood pressure fairly controlled.  C/w Amlodipine  10 mg po Daily and Metoprolol Tartrate 50 mg po BID.    ESRD on HD TTSat: Nephrology following.  Appreciate their help with management of this.   Symptomatic Anemia/Microcytic Anemia/Anemia of Chronic Kidney Disease: Hemoglobin 6.1 upon arrival, status post 1 unit of PRBC transfusion, hemoglobin improved to 8.4 but dropped to 6.0 today.  Patient very lethargic/symptomatic.  Transfusing 2 units of PRBC.  Thrombocytosis: Improved.  Personality issues/ ?  Depression: Patient appears to be depressed, refusing labs even prior to surgery.  After the surgery, she is more depressed.  Not talking to anyone, refusing medications and labs at times as well.  Palliative care attempted to talk to her yesterday and today but she would not talk to them and did not allow them to talk to her husband and did not want them to come back and see her.  They signed off.  I discussed with the patient's husband over the phone.  Per him, this is not usual about her.  However part of her lethargy and somnolence is likely secondary to anemia.  We are transfusing her today, will reassess her  tomorrow.  If she will remain depressed which likely is transient and situational, we may consider starting her on antidepressants.  DVT prophylaxis: SCD's Start: 11/26/23 0743 SCDs Start: 11/21/23 2128   Code Status: Full Code  Family Communication:  None present at bedside.  Plan of care discussed with patient in  length and he/she verbalized understanding and agreed with it.  Status is: Inpatient Remains inpatient appropriate because: Status post BKA, anemia, transfusing  Estimated body mass index is 25.92 kg/m as calculated from the following:   Height as of this encounter: 5' (1.524 m).   Weight as of this encounter: 60.2 kg.    Nutritional Assessment: Body mass index is 25.92 kg/m.[name] [name] Seen by dietician.  I agree with the assessment and plan as outlined below: Nutrition Status:        . Skin Assessment: I have examined the patient's skin and I agree with the wound assessment as performed by the wound care RN as outlined below:    Consultants:  Orthopedics  Procedures:  BKA Antimicrobials:  Anti-infectives (From admission, onward)    Start     Dose/Rate Route Frequency Ordered Stop   11/29/23 1200  vancomycin  (VANCOREADY) IVPB 750 mg/150 mL        750 mg 150 mL/hr over 60 Minutes Intravenous Every T-Th-Sa (Hemodialysis) 11/27/23 1150     11/27/23 1400  vancomycin  (VANCOREADY) IVPB 750 mg/150 mL        750 mg 150 mL/hr over 60 Minutes Intravenous  Once 11/27/23 1150     11/26/23 1708  vancomycin  variable dose per unstable renal function (pharmacist dosing)  Status:  Discontinued         Does not apply See admin instructions 11/26/23 1710 11/27/23 1151   11/26/23 1200  vancomycin  (VANCOREADY) IVPB 750 mg/150 mL  Status:  Discontinued        750 mg 150 mL/hr over 60 Minutes Intravenous Every T-Th-Sa (Hemodialysis) 11/25/23 1015 11/26/23 1710   11/25/23 1109  ceFAZolin  (ANCEF ) 2-4 GM/100ML-% IVPB       Note to Pharmacy: [name] [name], [name]: cabinet override      11/25/23 1109 11/25/23 1303   11/25/23 0600  ceFAZolin  (ANCEF ) IVPB 2g/100 mL premix        2 g 200 mL/hr over 30 Minutes Intravenous On call to O.R. 11/25/23 0214 11/25/23 1329   11/24/23 1200  vancomycin  (VANCOREADY) IVPB 750 mg/150 mL        750 mg 150 mL/hr over 60 Minutes Intravenous Every T-Th-Sa (Hemodialysis)  11/22/23 1437 11/24/23 1253   11/23/23 1330  vancomycin  (VANCOREADY) IVPB 750 mg/150 mL        750 mg 150 mL/hr over 60 Minutes Intravenous  Once 11/23/23 1243 11/23/23 1529   11/22/23 0600  Vancomycin  (VANCOCIN ) 1,250 mg in sodium chloride  0.9 % 250 mL IVPB        1,250 mg 166.7 mL/hr over 90 Minutes Intravenous  Once 11/21/23 2122 11/22/23 0714   11/22/23 0200  piperacillin -tazobactam (ZOSYN ) IVPB 2.25 g  Status:  Discontinued        2.25 g 100 mL/hr over 30 Minutes Intravenous Every 8 hours 11/21/23 2122 11/21/23 2126   11/21/23 2230  ceFEPIme  (MAXIPIME ) 1 g in sodium chloride  0.9 % 100 mL IVPB        1 g 200 mL/hr over 30 Minutes Intravenous Every 24 hours 11/21/23 2126     11/21/23 2120  vancomycin  variable dose per unstable renal function (pharmacist dosing)  Status:  Discontinued  Does not apply See admin instructions 11/21/23 2122 11/25/23 1015   11/21/23 1600  Ampicillin -Sulbactam (UNASYN ) 3 g in sodium chloride  0.9 % 100 mL IVPB       Placed in "And" Linked Group   3 g 200 mL/hr over 30 Minutes Intravenous  Once 11/21/23 1547 11/21/23 1740   11/21/23 1600  linezolid  (ZYVOX ) IVPB 600 mg       Placed in "And" Linked Group   600 mg 300 mL/hr over 60 Minutes Intravenous  Once 11/21/23 1547 11/21/23 1808         Subjective: Patient seen and examined in dialysis unit while receiving dialysis, patient appeared lethargic, it appeared that patient was trying to answer my questions but she was too weak to answer.  Objective: Vitals:   11/27/23 1230 11/27/23 1258 11/27/23 1315 11/27/23 1323  BP: 130/67 126/61 (!) 131/59 (!) 126/56  Pulse: 72 72 71 72  Resp: 14 17 15 18   Temp:  98.8 F (37.1 C)  99.1 F (37.3 C)  TempSrc:  Oral    SpO2: 100% 99% 99% 100%  Weight:      Height:       No intake or output data in the 24 hours ending 11/27/23 1336  Filed Weights   11/25/23 1124 11/27/23 0403 11/27/23 1110  Weight: 63.8 kg 64.7 kg 60.2 kg    Examination:  General  exam: Appears lethargic/somnolent Respiratory system: Clear to auscultation. Respiratory effort normal. Cardiovascular system: S1 & S2 heard, RRR. No JVD, murmurs, rubs, gallops or clicks. No pedal edema. Gastrointestinal system: Abdomen is nondistended, soft and nontender. No organomegaly or masses felt. Normal bowel sounds heard. Central nervous system: Somnolent. Extremities: Symmetric 5 x 5 power. Skin: No rashes, lesions or ulcers.    Data Reviewed: I have personally reviewed following labs and imaging studies  CBC: Recent Labs  Lab 11/21/23 1641 11/22/23 0049 11/22/23 0819 11/23/23 0631 11/24/23 0624 11/25/23 0759 11/27/23 1105  WBC 18.8*  --  17.7* 19.8* 20.0* 19.3* 15.0*  NEUTROABS 14.3*  --   --  16.0* 15.4* 15.6*  --   HGB 6.1*   < > 9.1* 8.4* 7.8* 8.4* 6.0*  HCT 20.9*   < > 30.3* 26.7* 24.9* 27.3* 20.2*  MCV 63.3*  --  65.6* 64.2* 64.7* 65.6* 65.4*  PLT 422*  --  376 343 356 322 356   < > = values in this interval not displayed.   Basic Metabolic Panel: Recent Labs  Lab 11/22/23 0049 11/23/23 0631 11/24/23 0624 11/25/23 0759 11/27/23 1105  NA 132* 133* 133* 134* 132*  K 3.6 3.9 4.1 3.9 4.4  CL 95* 98 97* 96* 95*  CO2 25 24 24 24 22   GLUCOSE 311* 152* 208* 180* 80  BUN 23* 10 20 13  26*  CREATININE 3.97* 2.14* 3.10* 2.23* 3.88*  CALCIUM  8.0* 8.0* 7.5* 8.0* 7.4*  PHOS 2.1* 1.2*  --  2.3* 3.5   GFR: Estimated Creatinine Clearance: 13 mL/min (A) (by C-G formula based on SCr of 3.88 mg/dL (H)). Liver Function Tests: Recent Labs  Lab 11/21/23 1641 11/22/23 0049 11/23/23 0631 11/24/23 0624 11/25/23 0759 11/27/23 1105  AST 14*  --   --  15  --   --   ALT 7  --   --  9  --   --   ALKPHOS 116  --   --  92  --   --   BILITOT 0.6  --   --  0.7  --   --  PROT 6.6  --   --  6.3*  --   --   ALBUMIN  2.5* 2.5* 2.1* 2.0* 2.1* 2.0*   No results for input(s): "LIPASE", "AMYLASE" in the last 168 hours. No results for input(s): "AMMONIA" in the last 168  hours. Coagulation Profile: No results for input(s): "INR", "PROTIME" in the last 168 hours. Cardiac Enzymes: No results for input(s): "CKTOTAL", "CKMB", "CKMBINDEX", "TROPONINI" in the last 168 hours. BNP (last 3 results) No results for input(s): "PROBNP" in the last 8760 hours. HbA1C: No results for input(s): "HGBA1C" in the last 72 hours.  CBG: Recent Labs  Lab 11/25/23 1136 11/25/23 1346 11/25/23 1558 11/26/23 2003 11/26/23 2245  GLUCAP 184* 148* 116* 86 97   Lipid Profile: No results for input(s): "CHOL", "HDL", "LDLCALC", "TRIG", "CHOLHDL", "LDLDIRECT" in the last 72 hours. Thyroid  Function Tests: No results for input(s): "TSH", "T4TOTAL", "FREET4", "T3FREE", "THYROIDAB" in the last 72 hours. Anemia Panel: No results for input(s): "VITAMINB12", "FOLATE", "FERRITIN", "TIBC", "IRON ", "RETICCTPCT" in the last 72 hours. Sepsis Labs: Recent Labs  Lab 11/21/23 1647  LATICACIDVEN 0.9    Recent Results (from the past 240 hours)  Blood Cultures x 2 sites     Status: None   Collection Time: 11/21/23  4:41 PM   Specimen: BLOOD LEFT ARM  Result Value Ref Range Status   Specimen Description   Final    BLOOD LEFT ARM Performed at [facility], [address] [place], [place] [address]    Special Requests   Final    BOTTLES DRAWN AEROBIC AND ANAEROBIC Blood Culture results may not be optimal due to an inadequate volume of blood received in culture bottles Performed at [facility], [address], [place], [place] [address]    Culture   Final    NO GROWTH 5 DAYS Performed at [facility], [address] [place], [place] [address]    Report Status 11/26/2023 FINAL  Final  Culture, blood (Routine X 2) w Reflex to ID Panel     Status: None   Collection Time: 11/22/23 12:48 AM   Specimen: BLOOD  Result Value Ref Range Status   Specimen Description BLOOD RIGHT ANTECUBITAL  Final   Special Requests   Final    BOTTLES DRAWN AEROBIC AND  ANAEROBIC Blood Culture adequate volume   Culture   Final    NO GROWTH 5 DAYS Performed at [facility], [address] [place], [place] [address]    Report Status 11/27/2023 FINAL  Final  Surgical pcr screen     Status: None   Collection Time: 11/25/23 11:31 AM   Specimen: Nasal Mucosa; Nasal Swab  Result Value Ref Range Status   MRSA, PCR NEGATIVE NEGATIVE Final   Staphylococcus aureus NEGATIVE NEGATIVE Final    Comment: (NOTE) The Xpert SA Assay (FDA approved for NASAL specimens in patients 74 years of age and older), is one component of a comprehensive surveillance program. It is not intended to diagnose infection nor to guide or monitor treatment. Performed at [facility], [address], [place], [place] [address]      Radiology Studies: No results found.   Scheduled Meds:  sodium chloride    Intravenous Once   sodium chloride    Intravenous Once   sodium chloride    Intravenous Once   amLODipine   5 mg Oral QPM   vitamin C  1,000 mg Oral Daily   calcitRIOL  0.25 mcg Oral Q T,Th,Sa-HD  calcium  carbonate  1 tablet Oral TID WC   Chlorhexidine  Gluconate Cloth  6 each Topical Daily   Chlorhexidine  Gluconate Cloth  6 each Topical Q0600   cloNIDine   0.1 mg Transdermal Weekly   [START ON 12/01/2023] darbepoetin (ARANESP ) injection - NON-DIALYSIS  200 mcg Subcutaneous Q Thu-1800   insulin  aspart  0-5 Units Subcutaneous QHS   insulin  aspart  0-6 Units Subcutaneous TID WC   metoprolol tartrate  50 mg Oral BID   nutrition supplement (JUVEN)  1 packet Oral BID BM   sodium chloride  flush  10-40 mL Intracatheter Q12H   zinc sulfate (50mg  elemental zinc)  220 mg Oral Daily   Continuous Infusions:  sodium chloride      ceFEPime  (MAXIPIME ) IV 1 g (11/26/23 2218)   vancomycin      [START ON 11/29/2023] vancomycin        LOS: 6 days   Modena Andes, MD [facility]  11/27/2023, 1:36 PM   *Please note that this is a verbal dictation therefore any spelling or  grammatical errors are due to the "Dragon Medical One" system interpretation.  Please page via Amion and do not message via secure chat for urgent patient care matters. Secure chat can be used for non urgent patient care matters.  How to contact the [facility] Attending or Consulting provider 7A - 7P or covering provider during after hours 7P -7A, for this patient?  Check the care team in [facility] and look for a) attending/consulting TR[facility] provider listed and b) the TR[facility] team listed. Page or secure chat 7A-7P. Log into [email or web address] and use [facility]'s universal password to access. If you do not have the password, please contact the hospital operator. Locate the [facility] provider you are looking for under [facility] and page to a number that you can be directly reached. If you still have difficulty reaching the provider, please page the [facility] (Director on Call) for the Hospitalists listed on amion for assistance.

## 2023-11-27 NOTE — TOC Initial Note (Addendum)
 Transition of Care [facility]) - Initial/Assessment Note    Patient Details  Name: [name] [name] MRN: [id number] Date of Birth: 04-29-1966  Transition of Care [facility]) CM/SW Contact:    Claudean Crumbly, LCSWA Phone Number: 11/27/2023, 10:00 AM  Clinical Narrative:                  SW spoke with pts husband Amye Baller [phone]) confirmed pt is LTC at [facility] and plan is for her to return. Pt has been there since March. SW confirmed demographics, Amye Baller requested to add pts other daughter Camilo Cella to contacts.  SW spoke with Kathaleen Pale [facility] [phone]) confirmed pt can return with or without auth. Currently pending.   Expected Discharge Plan: Skilled Nursing Facility Barriers to Discharge: Continued Medical Work up   Patient Goals and CMS Choice   CMS Medicare.gov Compare Post Acute Care list provided to:: Patient Represenative (must comment) (husband) Choice offered to / list presented to : Spouse [place] ownership interest in [facility].provided to:: Spouse    Expected Discharge Plan and Services       Living arrangements for the past 2 months: Skilled Nursing Facility                                      Prior Living Arrangements/Services Living arrangements for the past 2 months: Skilled Nursing Facility Lives with:: Spouse Patient language and need for interpreter reviewed:: Yes              Criminal Activity/Legal Involvement Pertinent to Current Situation/Hospitalization: No - Comment as needed  Activities of Daily Living   ADL Screening (condition at time of admission) Independently performs ADLs?: No Does the patient have a NEW difficulty with bathing/dressing/toileting/self-feeding that is expected to last >3 days?: No Does the patient have a NEW difficulty with getting in/out of bed, walking, or climbing stairs that is expected to last >3 days?: Yes (Initiates electronic notice to provider for possible PT consult) Does the  patient have a NEW difficulty with communication that is expected to last >3 days?: No Is the patient deaf or have difficulty hearing?: No Does the patient have difficulty seeing, even when wearing glasses/contacts?: Yes Does the patient have difficulty concentrating, remembering, or making decisions?: No  Permission Sought/Granted                  Emotional Assessment       Orientation: : Oriented to Self, Oriented to Place, Oriented to  Time, Oriented to Situation   Psych Involvement: No (comment)  Admission diagnosis:  Cellulitis of left lower extremity [L03.116] Osteomyelitis of left foot, unspecified type (HCC) [M86.9] Patient Active Problem List   Diagnosis Date Noted   Osteomyelitis of left foot (HCC) 11/24/2023   Cellulitis of left lower extremity 11/21/2023   Anemia due to chronic kidney disease 11/21/2023   Hammertoes of both feet 05/21/2022   Hyperpigmentation 05/21/2022   Hypertensive emergency 05/20/2022   Hypertensive urgency, malignant 05/19/2022   Pyocystis 05/19/2022   Hypokalemia 05/19/2022   Cellulitis 04/09/2021   Cellulitis of suprapubic region 04/09/2021   Leukocytosis 04/09/2021   DM2 (diabetes mellitus, type 2) (HCC) 04/09/2021   Osteoporosis 10/13/2020   Fever    Closed fracture of left proximal tibia 10/10/2020   Fracture of femoral neck, right (HCC) 10/02/2020   Pressure injury of skin 03/12/2020   Sepsis (HCC) 03/07/2020   ESRD (  end stage renal disease) (HCC) 03/07/2020   Hyperkalemia 03/07/2020   Acute cystitis 03/07/2020   Acute metabolic encephalopathy 03/07/2020   Impaired vision in both eyes 11/19/2015   Diabetes mellitus with complication [facility])    Intractable headache    Syncope 07/28/2015   Chronic headaches 07/28/2015   Chest pain 05/22/2015   Urinary frequency 03/13/2015   Easy bruising 10/09/2014   Encounter for preventative adult health care exam with abnormal findings 01/11/2014   Chronic constipation 01/11/2014   GERD  (gastroesophageal reflux disease) 01/11/2014   Helicobacter positive gastritis 12/06/2013   Dizziness and giddiness 10/12/2013   Essential hypertension 09/19/2013   Back pain 06/11/2013   Lumbosacral spondylosis without myelopathy 02/20/2013   Diabetic neuropathy (HCC) 01/09/2013   DM (diabetes mellitus), type 2, uncontrolled 10/02/2012   MITRAL REGURGITATION, mild 10/31/2009   Pleural effusion 10/20/2009   PCP:  Alwyn Juba., MD Pharmacy:   [facility]  - [place], [place] - [address] [place] [address] Phone: [phone] Fax: [phone]     Social Drivers of Health (SDOH) Social History: SDOH Screenings   Food Insecurity: No Food Insecurity (11/21/2023)  Housing: Unknown (11/21/2023)  Transportation Needs: No Transportation Needs (11/21/2023)  Utilities: Not At Risk (11/21/2023)  Social Connections: Unknown (11/15/2021)   Received from [facility]  Tobacco Use: Low Risk  (11/25/2023)   SDOH Interventions:     Readmission Risk Interventions     No data to display

## 2023-11-28 ENCOUNTER — Inpatient Hospital Stay (HOSPITAL_COMMUNITY)

## 2023-11-28 DIAGNOSIS — I120 Hypertensive chronic kidney disease with stage 5 chronic kidney disease or end stage renal disease: Secondary | ICD-10-CM

## 2023-11-28 DIAGNOSIS — Z89512 Acquired absence of left leg below knee: Secondary | ICD-10-CM

## 2023-11-28 DIAGNOSIS — Z7189 Other specified counseling: Secondary | ICD-10-CM

## 2023-11-28 DIAGNOSIS — M869 Osteomyelitis, unspecified: Secondary | ICD-10-CM

## 2023-11-28 DIAGNOSIS — I639 Cerebral infarction, unspecified: Secondary | ICD-10-CM | POA: Diagnosis not present

## 2023-11-28 DIAGNOSIS — Z515 Encounter for palliative care: Secondary | ICD-10-CM

## 2023-11-28 DIAGNOSIS — N186 End stage renal disease: Secondary | ICD-10-CM | POA: Diagnosis not present

## 2023-11-28 DIAGNOSIS — L03116 Cellulitis of left lower limb: Secondary | ICD-10-CM | POA: Diagnosis not present

## 2023-11-28 DIAGNOSIS — E1122 Type 2 diabetes mellitus with diabetic chronic kidney disease: Secondary | ICD-10-CM | POA: Diagnosis not present

## 2023-11-28 LAB — CBC WITH DIFFERENTIAL/PLATELET
Abs Immature Granulocytes: 0 10*3/uL (ref 0.00–0.07)
Basophils Absolute: 0.2 10*3/uL — ABNORMAL HIGH (ref 0.0–0.1)
Basophils Relative: 1 %
Eosinophils Absolute: 0 10*3/uL (ref 0.0–0.5)
Eosinophils Relative: 0 %
HCT: 35.1 % — ABNORMAL LOW (ref 36.0–46.0)
Hemoglobin: 11.8 g/dL — ABNORMAL LOW (ref 12.0–15.0)
Lymphocytes Relative: 10 %
Lymphs Abs: 1.7 10*3/uL (ref 0.7–4.0)
MCH: 24.9 pg — ABNORMAL LOW (ref 26.0–34.0)
MCHC: 33.6 g/dL (ref 30.0–36.0)
MCV: 74.1 fL — ABNORMAL LOW (ref 80.0–100.0)
Monocytes Absolute: 0.7 10*3/uL (ref 0.1–1.0)
Monocytes Relative: 4 %
Neutro Abs: 14.8 10*3/uL — ABNORMAL HIGH (ref 1.7–7.7)
Neutrophils Relative %: 85 %
Platelets: 320 10*3/uL (ref 150–400)
RBC: 4.74 MIL/uL (ref 3.87–5.11)
RDW: 27.6 % — ABNORMAL HIGH (ref 11.5–15.5)
WBC: 17.4 10*3/uL — ABNORMAL HIGH (ref 4.0–10.5)
nRBC: 0 % (ref 0.0–0.2)
nRBC: 0 /100{WBCs}

## 2023-11-28 LAB — BPAM RBC
Blood Product Expiration Date: 202506202359
Blood Product Expiration Date: 202506202359
ISSUE DATE / TIME: 202505251252
ISSUE DATE / TIME: 202505251252
Unit Type and Rh: 5100
Unit Type and Rh: 5100

## 2023-11-28 LAB — AMMONIA: Ammonia: 13 umol/L (ref 9–35)

## 2023-11-28 LAB — GLUCOSE, CAPILLARY
Glucose-Capillary: 85 mg/dL (ref 70–99)
Glucose-Capillary: 81 mg/dL (ref 70–99)
Glucose-Capillary: 71 mg/dL (ref 70–99)
Glucose-Capillary: 111 mg/dL — ABNORMAL HIGH (ref 70–99)
Glucose-Capillary: 88 mg/dL (ref 70–99)

## 2023-11-28 LAB — TYPE AND SCREEN
ABO/RH(D): O POS
Antibody Screen: NEGATIVE
Unit division: 0
Unit division: 0

## 2023-11-28 LAB — VITAMIN B12: Vitamin B-12: 800 pg/mL (ref 180–914)

## 2023-11-28 LAB — TSH: TSH: 4.35 u[IU]/mL (ref 0.350–4.500)

## 2023-11-28 MED ORDER — DEXTROSE 50 % IV SOLN: 12.5000 g | INTRAVENOUS | Status: AC

## 2023-11-28 MED ORDER — LORAZEPAM 2 MG/ML IJ SOLN
1.0000 mg | Freq: Three times a day (TID) | INTRAMUSCULAR | Status: DC | PRN
  Administered 2023-11-28: 1 mg via INTRAVENOUS

## 2023-11-28 MED ORDER — LORAZEPAM 2 MG/ML IJ SOLN
2.0000 mg | Freq: Once | INTRAMUSCULAR | Status: AC
  Administered 2023-11-28: 2 mg via INTRAVENOUS
  Filled 2023-11-28: qty 1

## 2023-11-28 MED ORDER — CHLORHEXIDINE GLUCONATE CLOTH 2 % EX PADS
6.0000 | MEDICATED_PAD | Freq: Every day | CUTANEOUS | Status: DC
  Administered 2023-12-02: 6 via TOPICAL

## 2023-11-28 MED ORDER — THIAMINE HCL 100 MG/ML IJ SOLN
100.0000 mg | Freq: Every day | INTRAMUSCULAR | Status: DC
  Administered 2023-11-29: 100 mg via INTRAVENOUS
  Filled 2023-11-28: qty 2

## 2023-11-28 MED ORDER — DEXTROSE 50 % IV SOLN
INTRAVENOUS | Status: AC
  Administered 2023-11-28: 12.5 g via INTRAVENOUS
  Filled 2023-11-28: qty 50

## 2023-11-28 MED ORDER — STROKE: EARLY STAGES OF RECOVERY BOOK
Freq: Once | Status: AC
  Filled 2023-11-28: qty 1

## 2023-11-28 MED ORDER — LORAZEPAM 2 MG/ML IJ SOLN
2.0000 mg | Freq: Four times a day (QID) | INTRAMUSCULAR | Status: AC
  Administered 2023-11-29 (×2): 2 mg via INTRAVENOUS
  Filled 2023-11-28 (×3): qty 1

## 2023-11-28 NOTE — Progress Notes (Signed)
   11/28/23 1201  Vitals  Temp 99.5 F (37.5 C)  Temp Source Oral  BP (!) 178/69  MAP (mmHg) 102  BP Location Left Arm  BP Method Automatic  Patient Position (if appropriate) Lying  Pulse Rate 75  Pulse Rate Source Monitor  Resp 16  Level of Consciousness  Level of Consciousness Alert  MEWS COLOR  MEWS Score Color Green  Oxygen Therapy  SpO2 98 %  MEWS Score  MEWS Temp 0  MEWS Systolic 0  MEWS Pulse 0  MEWS RR 0  MEWS LOC 0  MEWS Score 0     PRN Hydralazine  10 mg IV  given

## 2023-11-28 NOTE — Progress Notes (Signed)
 [facility] Progress Note   Subjective:   Sleeping, opens eyes briefly to voice then squeezes them shut. Not engaging in conversation today.   Objective Vitals:   11/27/23 1525 11/27/23 1635 11/27/23 1814 11/27/23 2141  BP: (!) 183/91 (!) 179/84 (!) 165/66 (!) 152/59  Pulse: 85 84 86 77  Resp: 16   17  Temp: 98.1 F (36.7 C)   99.6 F (37.6 C)  TempSrc: Oral   Oral  SpO2: 100%   95%  Weight:      Height:       Physical Exam General: Sleeping, awakens to voice, NAD Heart: RRR, no murmur Lungs: CTA bilaterally, respirations unlabored on RA Abdomen: Soft, non-distended Extremities:L BKA, RLE no edema noted Dialysis Access:  L internal jugular [facility]  Additional Objective Labs: Basic Metabolic Panel: Recent Labs  Lab 11/23/23 0631 11/24/23 0624 11/25/23 0759 11/27/23 1105  NA 133* 133* 134* 132*  K 3.9 4.1 3.9 4.4  CL 98 97* 96* 95*  CO2 24 24 24 22   GLUCOSE 152* 208* 180* 80  BUN 10 20 13  26*  CREATININE 2.14* 3.10* 2.23* 3.88*  CALCIUM  8.0* 7.5* 8.0* 7.4*  PHOS 1.2*  --  2.3* 3.5   Liver Function Tests: Recent Labs  Lab 11/21/23 1641 11/22/23 0049 11/24/23 0624 11/25/23 0759 11/27/23 1105  AST 14*  --  15  --   --   ALT 7  --  9  --   --   ALKPHOS 116  --  92  --   --   BILITOT 0.6  --  0.7  --   --   PROT 6.6  --  6.3*  --   --   ALBUMIN  2.5*   < > 2.0* 2.1* 2.0*   < > = values in this interval not displayed.   No results for input(s): "LIPASE", "AMYLASE" in the last 168 hours. CBC: Recent Labs  Lab 11/22/23 0819 11/23/23 0631 11/24/23 0624 11/25/23 0759 11/27/23 1105  WBC 17.7* 19.8* 20.0* 19.3* 15.0*  NEUTROABS  --  16.0* 15.4* 15.6*  --   HGB 9.1* 8.4* 7.8* 8.4* 6.0*  HCT 30.3* 26.7* 24.9* 27.3* 20.2*  MCV 65.6* 64.2* 64.7* 65.6* 65.4*  PLT 376 343 356 322 356   Blood Culture    Component Value Date/Time   SDES BLOOD RIGHT ANTECUBITAL 11/22/2023 0048   SPECREQUEST  11/22/2023 0048    BOTTLES DRAWN AEROBIC AND ANAEROBIC  Blood Culture adequate volume   CULT  11/22/2023 0048    NO GROWTH 5 DAYS Performed at [facility], [address], [place], [place] [address]    REPTSTATUS 11/27/2023 FINAL 11/22/2023 0048    Cardiac Enzymes: No results for input(s): "CKTOTAL", "CKMB", "CKMBINDEX", "TROPONINI" in the last 168 hours. CBG: Recent Labs  Lab 11/26/23 2245 11/27/23 1530 11/27/23 1815 11/27/23 2137 11/27/23 2309  GLUCAP 97 73 73 69* 114*   Iron  Studies: No results for input(s): "IRON ", "TIBC", "TRANSFERRIN", "FERRITIN" in the last 72 hours. @lablastinr3 @ Studies/Results: No results found. Medications:  sodium chloride      ceFEPime  (MAXIPIME ) IV 1 g (11/27/23 2219)   [START ON 11/29/2023] vancomycin       sodium chloride    Intravenous Once   sodium chloride    Intravenous Once   sodium chloride    Intravenous Once   amLODipine   5 mg Oral QPM   vitamin C  1,000 mg Oral Daily   calcitRIOL  0.25 mcg Oral Q T,Th,Sa-HD   calcium  carbonate  1 tablet Oral TID WC   Chlorhexidine  Gluconate Cloth  6 each Topical Daily   Chlorhexidine  Gluconate Cloth  6 each Topical Q0600   cloNIDine   0.1 mg Transdermal Weekly   [START ON 12/01/2023] darbepoetin (ARANESP ) injection - NON-DIALYSIS  200 mcg Subcutaneous Q Thu-1800   insulin  aspart  0-5 Units Subcutaneous QHS   insulin  aspart  0-6 Units Subcutaneous TID WC   metoprolol tartrate  50 mg Oral BID   nutrition supplement (JUVEN)  1 packet Oral BID BM   sodium chloride  flush  10-40 mL Intracatheter Q12H   zinc sulfate (50mg  elemental zinc)  220 mg Oral Daily    Dialysis Orders: GO TTS 3.75 hours  Heparin  5000 units BFR 350/ DFR 600 3K/2.5 Ca bath EDW 64 kg TDC Mircera 225 mcg q 2 weeks. Last dose 11/17/23 Calcitriol 0.25 mcg q HD    Assessment/Plan: Left Foot wound - abx and imaging per primary team    ESRD  - HD per TTS schedule usually.  - She refused labs on 5/24; Corrected calcium  and phos at goal yesterday - Note that her EDW will need to  decrease as she has had a BKA (also has a wound vac)   HTN -  - A bit hypertensive but has been refusing meds - note on an atypical regimen outpatient including high dose clonidine  patch.  And taking multiple meds on non-HD days - Note we reordered her clonidine  patch at a dose of 0.1 mg for now (the 0.2 mg patch was never applied) - reduced amlodipine  to 5 mg nightly and stopped hydralazine  - continue metoprolol for now - however note that she has still refused metoprolol intermittently   Anemia of CKD  - on max ESA outpt, recently dosed on 5/15.    - 2 units PRBC's 5/25 for critical Hb 6.0 - Will plan for Aranesp  200 mcg on 5/29 if still here and then weekly on Thursdays.    Metabolic bone disease  - hypophosphatemia, now improved.  Defer binder. On calcitriol    Disposition - per primary team.  Palliative care was appropriately consulted for goals of care discussions and the patient refused to participate or to allow her spouse to be contacted re: the same per charting.     [name] Burner, PA-C 11/28/2023, 8:43 AM  [facility] Pager: [phone]

## 2023-11-28 NOTE — Progress Notes (Addendum)
 The patient was observed in deep sleep following administration of Ativan (see MAR). Respiratory rate remained stable, averaging 16 breaths per min.  Upon awakening, the patient was encouraged to eat dinner and consumed approximately 50% of white rice.  The patient reported sacral pain; 5mg  of oxycodone  given.  The patient was then repositioned in bed, and a pillow was used to offload pressure from the sacral area.  Patient more communicative now than she was in the morning.

## 2023-11-28 NOTE — Progress Notes (Signed)
 IV team to bedside to evaluate for new IV access for contrast CTA. Pt is siting up eating upon introduction. Pt requests blanket. Blanket provided. Pt asks, "Where am I?"; this RN informs her that she is at [facility] and she has had surgery to remove an infection. I asked patient if she is hurting anywhere and she states, "My butt hurts". To return after pt is finished eating.

## 2023-11-28 NOTE — Progress Notes (Signed)
 Patient ID: [name] [name], female   DOB: [birth year], 58 y.o.   MRN: [id number] Patient is seen in follow-up status post left below-knee amputation.  There is not a good suction seal on the wound VAC canister and patient will transition to a dry dressing change at time of discharge.  I discontinued her antibiotics.

## 2023-11-28 NOTE — Consult Note (Addendum)
 NEUROLOGY CONSULT NOTE   Date of service: Nov 28, 2023 Patient Name: [name] [name] MRN:  [id number] DOB:  Dec 01, 1965 Chief Complaint: "Multifocal stroke" Requesting Provider: Modena Andes, MD  History of Present Illness  [name] [name] is a 58 y.o. female with hx of history significant for type 2 diabetes, anemia due to chronic kidney disease, hypertension and ESRD on hemodialysis Tuesday Thursday Saturdays, GERD, left eye blindness and lumbar radiculopathy who presented from her SNF from [facility] with a left heel wound and cellulitis and suspicion for possible and gas-forming infection.   On primary providers assessment, patient was having poor responsiveness and lethargy.They were attempting to engage in conversation with the patient and she just could not. Patient also was unable to lift her extremities and a stat MRI was ordered due to concern for possible stroke.  Imaging demonstrated multiple acute bilateral punctate nonhemorrhagic infarct and multiple vascular distributions and neurology was consulted.  On my assessment, patient was able to answer some questions with increased response latency. Prior to interview patient was undergoing Ativan challenge due to concern for catatonia. Patient was able to participate in some aspects of the assessment with variable effort and if given enough time to engage. Patient alert to person, situation, city, hospital and birthday. Patient also stated that she was blind on her left eye for roughly a year now.  Patient denied any decreased sensation in touch or new onset of motor weakness. Patient voiced desire to eat during assessment. When questioned why patient had not been eating before patient responded, "I dont know" and Had a stream of tears occur.   ROS  Pertinent positives included in HPI, however also limited due to responses during assessment  Past History   Past Medical History:  Diagnosis Date   Colitis    Diabetes mellitus    ESRD  (end stage renal disease) (HCC)    on HD (M,W,F)   Family history of adverse reaction to anesthesia    " my uncle's heart stoped "   Fracture of femoral neck, right (HCC) 10/02/2020   GERD (gastroesophageal reflux disease)    Heart murmur    Hypertension    Impaired vision in both eyes 11/19/2015   Mitral regurgitation    Neuropathy in diabetes (HCC)    Syncope and collapse 07/28/2015    Past Surgical History:  Procedure Laterality Date   ABDOMINAL HYSTERECTOMY  1999   for fibroids. she thinks one ovary was left   CATARACT EXTRACTION     CESAREAN SECTION     CHOLECYSTECTOMY     EYE SURGERY Left retinal   GANGLION CYST EXCISION     HIP PINNING,CANNULATED Right 10/02/2020   Procedure: CANNULATED HIP PINNING;  Surgeon: Hardy Lia, MD;  Location: [facility] OR;  Service: Orthopedics;  Laterality: Right;   INCISION AND DRAINAGE ABSCESS N/A 04/10/2021   Procedure: INCISION AND DRAINAGE ABSCESS LABIA;  Surgeon: Caralyn Chandler, MD;  Location: [facility] OR;  Service: General;  Laterality: N/A;   IR FLUORO GUIDE CV LINE LEFT  10/13/2020   IR PTA VENOUS EXCEPT DIALYSIS CIRCUIT  10/13/2020   ORIF TIBIA PLATEAU Left 10/09/2020   Procedure: OPEN REDUCTION INTERNAL FIXATION (ORIF) TIBIAL PLATEAU;  Surgeon: Hardy Lia, MD;  Location: [facility] OR;  Service: Orthopedics;  Laterality: Left;   TONSILLECTOMY     TUBAL LIGATION      Family History: Family History  Problem Relation Age of Onset   Diabetes Father    Heart attack Father  Aneurysm Mother     Social History  reports that she has never smoked. She has never used smokeless tobacco. She reports that she does not drink alcohol and does not use drugs.  Allergies  Allergen Reactions   Amitriptyline  Swelling and Other (See Comments)    Hypotension, Mental Status Changes, Confusion - "Allergic," per [facility]   Ciprofloxacin  Diarrhea, Nausea And Vomiting and Other (See Comments)    Severe vomiting requiring ED visit, IV reglan  and IV fluids-  "Allergic," per [facility]   Ibuprofen  Itching   Aspirin  Itching and Other (See Comments)    Pt endorses getting itchy in the past with aspirin . Tolerated dose 04/06/22.   Semaglutide Nausea Only and Other (See Comments)    "Allergic," per MAR   Tramadol  Itching   Tylenol  [Acetaminophen ] Itching and Other (See Comments)    "Allergic," per MAR    Medications   Current Facility-Administered Medications:    0.9 %  sodium chloride  infusion (Manually program via Guardrails IV Fluids), , Intravenous, Once, Timothy Ford, MD   0.9 %  sodium chloride  infusion (Manually program via Guardrails IV Fluids), , Intravenous, Once, Nan Aver, MD   0.9 %  sodium chloride  infusion (Manually program via Guardrails IV Fluids), , Intravenous, Once, Nan Aver, MD   0.9 %  sodium chloride  infusion, , Intravenous, Continuous, Timothy Ford, MD   acetaminophen  (TYLENOL ) tablet 650 mg, 650 mg, Oral, Q6H PRN **OR** acetaminophen  (TYLENOL ) suppository 650 mg, 650 mg, Rectal, Q6H PRN, Timothy Ford, MD   amLODipine  (NORVASC ) tablet 5 mg, 5 mg, Oral, QPM, Nan Aver, MD, 5 mg at 11/26/23 1710   ascorbic acid (VITAMIN C) tablet 1,000 mg, 1,000 mg, Oral, Daily, Duda, Marcus V, MD   calcitRIOL (ROCALTROL) capsule 0.25 mcg, 0.25 mcg, Oral, Q T,Th,Sa-HD, Duda, Marcus V, MD, 0.25 mcg at 11/24/23 1147   calcium  carbonate (dosed in mg elemental calcium ) suspension 500 mg of elemental calcium , 500 mg of elemental calcium , Oral, Q6H PRN, Duda, Marcus V, MD   calcium  carbonate (TUMS - dosed in mg elemental calcium ) chewable tablet 200 mg of elemental calcium , 1 tablet, Oral, TID WC, Duda, Marcus V, MD, 200 mg of elemental calcium  at 11/25/23 1754   camphor-menthol  (SARNA) lotion 1 Application, 1 Application, Topical, Q8H PRN **AND** hydrOXYzine  (ATARAX ) tablet 25 mg, 25 mg, Oral, Q8H PRN, Duda, Marcus V, MD, 25 mg at 11/25/23 2020   Chlorhexidine  Gluconate Cloth 2 % PADS 6 each, 6 each, Topical, Daily, Pahwani, Ravi, MD    Chlorhexidine  Gluconate Cloth 2 % PADS 6 each, 6 each, Topical, Q0600, Nan Aver, MD   Chlorhexidine  Gluconate Cloth 2 % PADS 6 each, 6 each, Topical, Q0600, Collins, Samantha G, PA-C   cloNIDine  (CATAPRES  - Dosed in mg/24 hr) patch 0.1 mg, 0.1 mg, Transdermal, Weekly, Duda, Marcus V, MD, 0.1 mg at 11/23/23 1644   [START ON 12/01/2023] Darbepoetin Alfa  (ARANESP ) injection 200 mcg, 200 mcg, Subcutaneous, Q Thu-1800, Nan Aver, MD   docusate sodium  [facility]) enema 283 mg, 1 enema, Rectal, PRN, Duda, Marcus V, MD   feeding supplement (NEPRO CARB STEADY) liquid 237 mL, 237 mL, Oral, TID PRN, Duda, Marcus V, MD   hydrALAZINE  (APRESOLINE ) injection 10 mg, 10 mg, Intravenous, Q6H PRN, Pahwani, Ravi, MD, 10 mg at 11/28/23 1212   HYDROmorphone  (DILAUDID ) injection 0.5-1 mg, 0.5-1 mg, Intravenous, Q4H PRN, Duda, Marcus V, MD   insulin  aspart (novoLOG ) injection 0-5 Units, 0-5 Units, Subcutaneous, QHS, Duda, Marcus V,  MD   insulin  aspart (novoLOG ) injection 0-6 Units, 0-6 Units, Subcutaneous, TID WC, Duda, Marcus V, MD   metoprolol tartrate (LOPRESSOR) tablet 50 mg, 50 mg, Oral, BID, Duda, Marcus V, MD, 50 mg at 11/25/23 1610   nutrition supplement (JUVEN) (JUVEN) powder packet 1 packet, 1 packet, Oral, BID BM, Duda, Marcus V, MD   ondansetron  (ZOFRAN ) tablet 4 mg, 4 mg, Oral, Q6H PRN **OR** ondansetron  (ZOFRAN ) injection 4 mg, 4 mg, Intravenous, Q6H PRN, Duda, Marcus V, MD, 4 mg at 11/25/23 1601   oxyCODONE  (Oxy IR/ROXICODONE ) immediate release tablet 10-15 mg, 10-15 mg, Oral, Q4H PRN, Duda, Marcus V, MD, 10 mg at 11/26/23 1709   oxyCODONE  (Oxy IR/ROXICODONE ) immediate release tablet 5-10 mg, 5-10 mg, Oral, Q4H PRN, Timothy Ford, MD   sodium chloride  flush (NS) 0.9 % injection 10-40 mL, 10-40 mL, Intracatheter, Q12H, Pahwani, Martha Slack, MD, 10 mL at 11/27/23 2232   sodium chloride  flush (NS) 0.9 % injection 10-40 mL, 10-40 mL, Intracatheter, PRN, Modena Andes, MD   sorbitol  70 % solution 30 mL, 30 mL,  Oral, PRN, Duda, Marcus V, MD   zinc sulfate (50mg  elemental zinc) capsule 220 mg, 220 mg, Oral, Daily, Timothy Ford, MD  Vitals   Vitals:   11/27/23 1635 11/27/23 1814 11/27/23 2141 11/28/23 1201  BP: (!) 179/84 (!) 165/66 (!) 152/59 (!) 178/69  Pulse: 84 86 77 75  Resp:   17 16  Temp:   99.6 F (37.6 C) 99.5 F (37.5 C)  TempSrc:   Oral Oral  SpO2:   95% 98%  Weight:      Height:        Body mass index is 25.92 kg/m.  Physical Exam   Constitutional: Appears well-developed and well-nourished.  Patient appears withdrawn and drowsy. Arousable by voice and touch. Psych: Flat affect, verbal however responses are slow HENT: No OP obstruction.  Head: Normocephalic. Cardiovascular: Normal rate per telemetry Respiratory: Effort normal, non-labored breathing.  GI: Soft.  No distension. There is no tenderness.   Neurologic Examination  Patient received 2 mg of Ativan during residence exam and had 4 mg of Ativan total during attending physicians exam.  Mental Status: Patient is drowsy, but arousable to voice and touch  oriented to person, place, month, year, and situation.  Disoriented to age.  Patient able to identify thumb, brown skin, glasses No signs of aphasia, increased response latency.  Cranial Nerves: II: Patient blind on left side. Full visual fields in right eye. Pupils are equal, round, and reactive to light. Doesn't blink to incoming threat on left side.  III,IV, VI: EOMI without ptosis or diploplia. Slightly disconjugate gaze.  V: Facial sensation is symmetric to temperature VII: Facial movement is symmetric.  VIII: hearing is intact to voice X: Uvula elevates symmetrically XI: Shoulder shrug is symmetric. XII: tongue is midline without atrophy or fasciculations.  Motor: Tone is normal. Bulk is normal.  Able to hold upper extremities up against gravity.  With mild right side weakness, in comparison to the left, when testing strength. Sensory: Sensation is  symmetric to light touch in the arms and legs. Noxious painful stimuli in right lower extremity.  Gait:  Deferred in acute setting and due to recent L BKA   Labs/Imaging/Neurodiagnostic studies   CBC:  Recent Labs  Lab 12/08/23 0624 11/25/23 0759 11/27/23 1105  WBC 20.0* 19.3* 15.0*  NEUTROABS 15.4* 15.6*  --   HGB 7.8* 8.4* 6.0*  HCT 24.9* 27.3* 20.2*  MCV 64.7* 65.6*  65.4*  PLT 356 322 356   Basic Metabolic Panel:  Lab Results  Component Value Date   NA 132 (L) 11/27/2023   K 4.4 11/27/2023   CO2 22 11/27/2023   GLUCOSE 80 11/27/2023   BUN 26 (H) 11/27/2023   CREATININE 3.88 (H) 11/27/2023   CALCIUM  7.4 (L) 11/27/2023   GFRNONAA 13 (L) 11/27/2023   GFRAA 10 (L) 03/14/2020   Lipid Panel:  Lab Results  Component Value Date   LDLCALC 84 05/22/2015   HgbA1c:  Lab Results  Component Value Date   HGBA1C 7.1 (H) 11/22/2023   Urine Drug Screen:     Component Value Date/Time   LABOPIA NONE DETECTED 12/01/2008 1935   COCAINSCRNUR NONE DETECTED 12/01/2008 1935   COCAINSCRNUR NEG 09/25/2007 2039   LABBENZ NONE DETECTED 12/01/2008 1935   LABBENZ NEG 09/25/2007 2039   AMPHETMU NONE DETECTED 12/01/2008 1935   THCU NONE DETECTED 12/01/2008 1935   LABBARB  12/01/2008 1935    NONE DETECTED        DRUG SCREEN FOR MEDICAL PURPOSES ONLY.  IF CONFIRMATION IS NEEDED FOR ANY PURPOSE, NOTIFY LAB WITHIN 5 DAYS.        LOWEST DETECTABLE LIMITS FOR URINE DRUG SCREEN Drug Class       Cutoff (ng/mL) Amphetamine      1000 Barbiturate      200 Benzodiazepine   200 Tricyclics       300 Opiates          300 Cocaine          300 THC              50    Alcohol Level No results found for: "ETH" INR  Lab Results  Component Value Date   INR 1.1 03/08/2020   APTT  Lab Results  Component Value Date   APTT 31 03/07/2020   AED levels: No results found for: "PHENYTOIN", "ZONISAMIDE", "LAMOTRIGINE", "LEVETIRACETA"  CT angio Head and Neck with contrast(Personally  reviewed): Pending   MRI Brain(Personally reviewed): 1. Multiple acute bilateral punctate nonhemorrhagic infarcts in multiple vascular distributions as described. 2. Slight progression of periventricular and subcortical white matter T2 hyperintensities and moderate generalized atrophy compared to the prior exam. White matter changes extend into the brainstem. Findings are consistent with progressive chronic microvascular disease. 3. Fluid levels in the sphenoid sinuses, right greater than left suggesting acute sinusitis .  ASSESSMENT   [name] [name] is a 58 y.o. female ype 2 diabetes, anemia due to chronic kidney disease, hypertension and ESRD on hemodialysis Tuesday Thursday Saturdays, GERD, left eye blindness and lumbar radiculopathy and multiple acute punctate nonhemorrhagic infarcts and vascular distributions noted on MRI obtained on 5/26.  Given images patient continues to need neurological workup for stroke during this admission.   RECOMMENDATIONS  # Multifocal stroke - Stroke labs fasting lipid panel - CTA  - Frequent neuro checks - Echocardiogram - Prophylactic therapy-Antiplatelet med: OK to start Plavix daily for monotherapy once hemoglobin is stable,  patient with allergy(itchy) to ASA in epic   - Risk factor modification - Blood pressure goal   - Permissive hypertension to 220/120 due to acute stroke - PT consult, OT consult, Speech consult, unless patient is back to baseline - Defer management stroke team - Stroke team to follow ___________________________________________________________  Signed, Brayton Calin, MD  [facility] PGY-1   I seen the patient reviewed the above note.  She has multifocal small embolic appearing strokes.  I suspect her  encephalopathy is multifactorial from her infection, recent surgery, multiple strokes.  I would favor ruling out some other common metabolic contributors with TSH, ammonia, B12, B1.  My suspicion for Wernicke's is  very low, but I will start 100 mg daily pending the level returning.  I will also repeat blood cultures.  She will need further stroke workup as above and stroke team will follow.  Ann Keto, MD [facility]   If 7pm- 7am, please page neurology on call as listed in AMION.

## 2023-11-28 NOTE — Progress Notes (Addendum)
 PROGRESS NOTE    [name] [name]  EAV:[id number] DOB: 11/08/1965 DOA: 11/21/2023 PCP: Alwyn Juba., MD   Brief Narrative:  The patient 58 year old African-American female with a past medical history significant for ESRD on hemodialysis Tuesday Thursday Saturdays, GERD, essential hypertension, left eye blindness, diabetes mellitus type 2 which is fairly well-controlled, history of lumbar radiculopathy presented from her SNF from [facility] with a left heel wound and cellulitis and suspicion for possible and gas-forming infection. She has been undergoing wound care treatments but states that the foot has gotten worse. Workup in the ED shows a wound infection and cellulitis but there is concern for deeper infection so we will obtain an MRI of the heel and foot and obtain vascular ABIs. On presentation she is found to have a hemoglobin of 6.1 with no obvious source of bleeding so she is typed and screened and transfused 1 unit PRBCs. Nephrology is consulted for maintenance of hemodialysis and pending further workup may need to consult vascular surgery and orthopedic surgery for further evaluation   Assessment & Plan:   Principal Problem:   Cellulitis of left lower extremity Active Problems:   Diabetic neuropathy (HCC)   Essential hypertension   GERD (gastroesophageal reflux disease)   ESRD (end stage renal disease) (HCC)   DM2 (diabetes mellitus, type 2) (HCC)   Anemia due to chronic kidney disease   Osteomyelitis of left foot (HCC)  Infected Diabetic Foot ulcer with Cellulitis/gangrene and gas-forming infection of the left heel/osteomyelitis: The unstageable pressure heel left ulcer with a wound bed black eschar.  States that she sees the wound clinic but is not getting better.  she received Unasyn  and linezolid  in the ED and was later started on cefepime  and vancomycin .  Blood cultures x 2 were obtained and NGTD.  MRI confirms osteomyelitis.  Ortho consulted, patient seen by Dr. 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  Has wound VAC attached, suction is good per Dr. Julio Ohm.  Patient still remains on cefepime , antibiotics duration per Dr. Julio Ohm.   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  Intermittent hypoglycemia in the setting of poor p.o. intake.  C/w Sensitive Novolog  SSI AC/HS. Has been refusing her Insulin  regimen.    HypoNa+: Mild, asymptomatic, improving.   Essential HTN: Blood pressure fairly controlled.  C/w Amlodipine  10 mg po Daily and Metoprolol Tartrate 50 mg po BID.    ESRD on HD TTSat: Nephrology following.  Appreciate their help with management of this.   Symptomatic Anemia/Microcytic Anemia/Anemia of Chronic Kidney Disease: Hemoglobin 6.1 upon arrival, status post 1 unit of PRBC transfusion, hemoglobin improved to 8.4 but dropped to 6.0 on 11/27/2023 for which she received 2 units of PRBC transfusion in dialysis unit.  Patient has refused lab draw/CBC check posttransfusion and this morning despite of multiple attempts by the staff.  Thrombocytosis: Improved.  Personality issues/ ?  Depression/GOC: Patient appears to be depressed, refusing labs even prior to surgery.  After the surgery, she is more depressed.  Not talking to anyone, refusing medications and labs at times as well.  Palliative care attempted to talk to her but she would not talk to them so they signed off.  I discussed with the patient's husband over the phone.  Per him, this is not usual about her.  Patient continues to avoid conversations with anybody and refuses meds, labs and care.  I have reconsulted palliative care today since patient is refusing care and she may be appropriate candidate for hospice.  I have requested them for their close involvement in this particular case.  They recommended psychiatry consultation, I have consulted psychiatry today.  I  have spoken to the husband yet again today, informed him about patient's refusal of care and requested him to come at the bedside and talk to the patient and let us  know what exactly her wishes are.  Palliative care informed about my conversations with the husband.  Lethargy/poor responsiveness: Has been having poor responsiveness, unwillingness to have conversation with staff most of the time.  Today I bluntly spoke to the patient that if she is not going to eat, allow the proper care, we would likely have no choice and may be bound to consider hospice on her.  It appeared that patient then wanted to talk but she just could not.  I asked her to lift her extremities which she did not.  This raises concern for possible stroke for which I have ordered stat MRI on her.  Addendum 12:30 PM: Received a call from radiology about MRI results which shows multiple acute bilateral punctate nonhemorrhagic infarcts in multiple vascular distributions.  Consulted neurology/Dr. Alecia Ames right away.  DVT prophylaxis: SCD's Start: 11/26/23 0743 SCDs Start: 11/21/23 2128   Code Status: Full Code  Family Communication:  None present at bedside.  Discussed with husband over the phone.  Status is: Inpatient Remains inpatient appropriate because: Status post BKA, anemia, poor responsiveness or unwillingness to participate in the medical care.  Estimated body mass index is 25.92 kg/m as calculated from the following:   Height as of this encounter: 5' (1.524 m).   Weight as of this encounter: 60.2 kg.    Nutritional Assessment: Body mass index is 25.92 kg/m.Aaron Aas Seen by dietician.  I agree with the assessment and plan as outlined below: Nutrition Status:        . Skin Assessment: I have examined the patient's skin and I agree with the wound assessment as performed by the wound care RN as outlined below:    Consultants:  Orthopedics  Procedures:  BKA Antimicrobials:  Anti-infectives (From admission,  onward)    Start     Dose/Rate Route Frequency Ordered Stop   11/29/23 1200  vancomycin  (VANCOREADY) IVPB 750 mg/150 mL        750 mg 150 mL/hr over 60 Minutes Intravenous Every T-Th-Sa (Hemodialysis) 11/27/23 1150     11/27/23 1400  vancomycin  (VANCOREADY) IVPB 750 mg/150 mL        750 mg 150 mL/hr over 60 Minutes Intravenous  Once 11/27/23 1150 11/27/23 1757   11/26/23 1708  vancomycin  variable dose per unstable renal function (pharmacist dosing)  Status:  Discontinued         Does not apply See admin instructions 11/26/23 1710 11/27/23 1151   11/26/23 1200  vancomycin  (VANCOREADY) IVPB 750 mg/150 mL  Status:  Discontinued        750 mg 150 mL/hr over 60 Minutes Intravenous Every T-Th-Sa (Hemodialysis) 11/25/23 1015 11/26/23 1710   11/25/23 1109  ceFAZolin  (ANCEF ) 2-4 GM/100ML-% IVPB       Note to Pharmacy: [name], [name]: cabinet override      11/25/23 1109 11/25/23 1303   11/25/23 0600  ceFAZolin  (ANCEF ) IVPB 2g/100 mL premix        2 g 200 mL/hr over 30 Minutes Intravenous On call to O.R. 11/25/23 0214 11/25/23 1329   11/24/23  1200  vancomycin  (VANCOREADY) IVPB 750 mg/150 mL        750 mg 150 mL/hr over 60 Minutes Intravenous Every T-Th-Sa (Hemodialysis) 11/22/23 1437 11/24/23 1253   11/23/23 1330  vancomycin  (VANCOREADY) IVPB 750 mg/150 mL        750 mg 150 mL/hr over 60 Minutes Intravenous  Once 11/23/23 1243 11/23/23 1529   11/22/23 0600  Vancomycin  (VANCOCIN ) 1,250 mg in sodium chloride  0.9 % 250 mL IVPB        1,250 mg 166.7 mL/hr over 90 Minutes Intravenous  Once 11/21/23 2122 11/22/23 0714   11/22/23 0200  piperacillin -tazobactam (ZOSYN ) IVPB 2.25 g  Status:  Discontinued        2.25 g 100 mL/hr over 30 Minutes Intravenous Every 8 hours 11/21/23 2122 11/21/23 2126   11/21/23 2230  ceFEPIme  (MAXIPIME ) 1 g in sodium chloride  0.9 % 100 mL IVPB        1 g 200 mL/hr over 30 Minutes Intravenous Every 24 hours 11/21/23 2126     11/21/23 2120  vancomycin  variable dose per  unstable renal function (pharmacist dosing)  Status:  Discontinued         Does not apply See admin instructions 11/21/23 2122 11/25/23 1015   11/21/23 1600  Ampicillin -Sulbactam (UNASYN ) 3 g in sodium chloride  0.9 % 100 mL IVPB       Placed in "And" Linked Group   3 g 200 mL/hr over 30 Minutes Intravenous  Once 11/21/23 1547 11/21/23 1740   11/21/23 1600  linezolid  (ZYVOX ) IVPB 600 mg       Placed in "And" Linked Group   600 mg 300 mL/hr over 60 Minutes Intravenous  Once 11/21/23 1547 11/21/23 1808         Subjective: Seen and examined.  Remains partially somnolent, she woke up and was trying to talk once I initiated the conversation about possible hospice.  But she still could not talk due to weakness/lethargy.  Objective: Vitals:   11/27/23 1525 11/27/23 1635 11/27/23 1814 11/27/23 2141  BP: (!) 183/91 (!) 179/84 (!) 165/66 (!) 152/59  Pulse: 85 84 86 77  Resp: 16   17  Temp: 98.1 F (36.7 C)   99.6 F (37.6 C)  TempSrc: Oral   Oral  SpO2: 100%   95%  Weight:      Height:        Intake/Output Summary (Last 24 hours) at 11/28/2023 0841 Last data filed at 11/27/2023 2219 Gross per 24 hour  Intake 890 ml  Output --  Net 890 ml    Filed Weights   11/25/23 1124 11/27/23 0403 11/27/23 1110  Weight: 63.8 kg 64.7 kg 60.2 kg    Examination:  General exam: Appears lethargic/somnolent Respiratory system: Clear to auscultation. Respiratory effort normal. Cardiovascular system: S1 & S2 heard, RRR. No JVD, murmurs, rubs, gallops or clicks. No pedal edema. Gastrointestinal system: Abdomen is nondistended, soft and nontender. No organomegaly or masses felt. Normal bowel sounds heard. Central nervous system: Somnolent. Extremities: Symmetric 5 x 5 power. Skin: No rashes, lesions or ulcers.    Data Reviewed: I have personally reviewed following labs and imaging studies  CBC: Recent Labs  Lab 11/21/23 1641 11/22/23 0049 11/22/23 0819 11/23/23 0631 11/24/23 0624  11/25/23 0759 11/27/23 1105  WBC 18.8*  --  17.7* 19.8* 20.0* 19.3* 15.0*  NEUTROABS 14.3*  --   --  16.0* 15.4* 15.6*  --   HGB 6.1*   < > 9.1* 8.4* 7.8* 8.4* 6.0*  HCT 20.9*   < > 30.3* 26.7* 24.9* 27.3* 20.2*  MCV 63.3*  --  65.6* 64.2* 64.7* 65.6* 65.4*  PLT 422*  --  376 343 356 322 356   < > = values in this interval not displayed.   Basic Metabolic Panel: Recent Labs  Lab 11/22/23 0049 11/23/23 0631 11/24/23 0624 11/25/23 0759 11/27/23 1105  NA 132* 133* 133* 134* 132*  K 3.6 3.9 4.1 3.9 4.4  CL 95* 98 97* 96* 95*  CO2 25 24 24 24 22   GLUCOSE 311* 152* 208* 180* 80  BUN 23* 10 20 13  26*  CREATININE 3.97* 2.14* 3.10* 2.23* 3.88*  CALCIUM  8.0* 8.0* 7.5* 8.0* 7.4*  PHOS 2.1* 1.2*  --  2.3* 3.5   GFR: Estimated Creatinine Clearance: 13 mL/min (A) (by C-G formula based on SCr of 3.88 mg/dL (H)). Liver Function Tests: Recent Labs  Lab 11/21/23 1641 11/22/23 0049 11/23/23 0631 11/24/23 0624 11/25/23 0759 11/27/23 1105  AST 14*  --   --  15  --   --   ALT 7  --   --  9  --   --   ALKPHOS 116  --   --  92  --   --   BILITOT 0.6  --   --  0.7  --   --   PROT 6.6  --   --  6.3*  --   --   ALBUMIN  2.5* 2.5* 2.1* 2.0* 2.1* 2.0*   No results for input(s): "LIPASE", "AMYLASE" in the last 168 hours. No results for input(s): "AMMONIA" in the last 168 hours. Coagulation Profile: No results for input(s): "INR", "PROTIME" in the last 168 hours. Cardiac Enzymes: No results for input(s): "CKTOTAL", "CKMB", "CKMBINDEX", "TROPONINI" in the last 168 hours. BNP (last 3 results) No results for input(s): "PROBNP" in the last 8760 hours. HbA1C: No results for input(s): "HGBA1C" in the last 72 hours.  CBG: Recent Labs  Lab 11/26/23 2245 11/27/23 1530 11/27/23 1815 11/27/23 2137 11/27/23 2309  GLUCAP 97 73 73 69* 114*   Lipid Profile: No results for input(s): "CHOL", "HDL", "LDLCALC", "TRIG", "CHOLHDL", "LDLDIRECT" in the last 72 hours. Thyroid  Function Tests: No  results for input(s): "TSH", "T4TOTAL", "FREET4", "T3FREE", "THYROIDAB" in the last 72 hours. Anemia Panel: No results for input(s): "VITAMINB12", "FOLATE", "FERRITIN", "TIBC", "IRON ", "RETICCTPCT" in the last 72 hours. Sepsis Labs: Recent Labs  Lab 11/21/23 1647  LATICACIDVEN 0.9    Recent Results (from the past 240 hours)  Blood Cultures x 2 sites     Status: None   Collection Time: 11/21/23  4:41 PM   Specimen: BLOOD LEFT ARM  Result Value Ref Range Status   Specimen Description   Final    BLOOD LEFT ARM Performed at [facility], [address], [place], [place] [address]    Special Requests   Final    BOTTLES DRAWN AEROBIC AND ANAEROBIC Blood Culture results may not be optimal due to an inadequate volume of blood received in culture bottles Performed at [facility], [address] [place], [place] [address]    Culture   Final    NO GROWTH 5 DAYS Performed at [facility], [address], [place], [place] [address]    Report Status 11/26/2023 FINAL  Final  Culture, blood (Routine X 2) w Reflex to ID Panel     Status: None   Collection Time: 11/22/23 12:48 AM   Specimen: BLOOD  Result Value  Ref Range Status   Specimen Description BLOOD RIGHT ANTECUBITAL  Final   Special Requests   Final    BOTTLES DRAWN AEROBIC AND ANAEROBIC Blood Culture adequate volume   Culture   Final    NO GROWTH 5 DAYS Performed at [facility], [address] [place], [place] [address]    Report Status 11/27/2023 FINAL  Final  Surgical pcr screen     Status: None   Collection Time: 11/25/23 11:31 AM   Specimen: Nasal Mucosa; Nasal Swab  Result Value Ref Range Status   MRSA, PCR NEGATIVE NEGATIVE Final   Staphylococcus aureus NEGATIVE NEGATIVE Final    Comment: (NOTE) The Xpert SA Assay (FDA approved for NASAL specimens in patients 71 years of age and older), is one component of a comprehensive surveillance program. It is not intended to diagnose  infection nor to guide or monitor treatment. Performed at [facility], [address] [place], [place] [address]      Radiology Studies: No results found.   Scheduled Meds:  sodium chloride    Intravenous Once   sodium chloride    Intravenous Once   sodium chloride    Intravenous Once   amLODipine   5 mg Oral QPM   vitamin C   1,000 mg Oral Daily   calcitRIOL   0.25 mcg Oral Q T,Th,Sa-HD   calcium  carbonate  1 tablet Oral TID WC   Chlorhexidine  Gluconate Cloth  6 each Topical Daily   Chlorhexidine  Gluconate Cloth  6 each Topical Q0600   cloNIDine   0.1 mg Transdermal Weekly   [START ON 12/01/2023] darbepoetin (ARANESP ) injection - NON-DIALYSIS  200 mcg Subcutaneous Q Thu-1800   insulin  aspart  0-5 Units Subcutaneous QHS   insulin  aspart  0-6 Units Subcutaneous TID WC   metoprolol  tartrate  50 mg Oral BID   nutrition supplement (JUVEN)  1 packet Oral BID BM   sodium chloride  flush  10-40 mL Intracatheter Q12H   zinc  sulfate (50mg  elemental zinc )  220 mg Oral Daily   Continuous Infusions:  sodium chloride      ceFEPime  (MAXIPIME ) IV 1 g (11/27/23 2219)   [START ON 11/29/2023] vancomycin        LOS: 7 days   Modena Andes, MD [facility]  11/28/2023, 8:41 AM   *Please note that this is a verbal dictation therefore any spelling or grammatical errors are due to the "Dragon [employer] One" system interpretation.  Please page via Amion and do not message via secure chat for urgent patient care matters. Secure chat can be used for non urgent patient care matters.  How to contact the [facility] Attending or Consulting provider 7A - 7P or covering provider during after hours 7P -7A, for this patient?  Check the care team in [facility] and look for a) attending/consulting [facility] provider listed and b) the [facility] team listed. Page or secure chat 7A-7P. Log into [email or web address] and use [facility] universal password to access. If you do not have the password, please contact the hospital  operator. Locate the [facility] provider you are looking for under [facility] and page to a number that you can be directly reached. If you still have difficulty reaching the provider, please page the [facility] (Director on Call) for the Hospitalists listed on amion for assistance.

## 2023-11-28 NOTE — Progress Notes (Signed)
 BG down to 71mg /dL. Patient very sleepy and unable to drink a cup of orange juice.  Patient refused to eat breakfast and has only been taking a few sips of orange juice when encouraged too.  D50% 12.5 given via IV.  BG recheck 111.  MD notified via secure chat.

## 2023-11-28 NOTE — Progress Notes (Signed)
 6 [employer] RN requested SRN to perform first NIHSS on this patient. Please see documentation for details.   Mechele Spiegel RN Stroke Response

## 2023-11-28 NOTE — Progress Notes (Signed)
 Pt continues to refuse care: CHG, medications, food. She will only answer "no", some times.

## 2023-11-28 NOTE — Consult Note (Addendum)
 WOC Nurse Consult Note: Reason for Consult: Requested to assess a wound on coccyx. 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 Pressure Injury POA: No Measurement: 2 cm x 1.5 cm x 0.2 cm. Wound bed: 80% beefy red, 20% yellow slough. Drainage (amount, consistency, odor) Minimum, serous, no odor. Periwound: intact, healing tissue discolor. Dressing procedure/placement/frequency: Clean with Vashe [phone]). Apply Xeroform on the wound bed, change daily. Cover with foam dressing, change every 3 days or PRN.   WOC team will follow weekly regarding the PI.  Please reconsult if further assistance is needed. Thank-you,  Rachel Budds BSN, RN, [employer], WOC  (Pager: [phone])

## 2023-11-28 NOTE — Consult Note (Signed)
 Bush-Francis Catatonia Rating Scale from [email or web address]  on 11/28/2023  RESULT SUMMARY: 13 points Bush-Francis Catatonia Rating Scale  Higher scores indicate greater severity of catatonia.   INPUTS: Excitement --> 0 = Absent Immobility/stupor --> 2 = Virtually no interaction with external world Mutism --> 3 = No speech Staring --> 2 = Gaze held longer than 20 sec; occasionally shifts attention Posturing/catalepsy --> 0 = Absent Grimacing --> 1 = <10 sec Echopraxia/echolalia --> 0 = Absent Stereotypy --> 0 = Absent Mannerisms --> 0 = Absent Verbigeration --> 0 = Absent Rigidity --> 1 = Mild resistance Negativism --> 1 = Mild resistance and/or occasionally contrary Waxy flexibility --> 0 = Absent Withdrawal --> 3 = No PO intake/interaction for >=1 day   Patient Presentation:  On initial presentation, the patient exhibited symptoms of withdrawal, mutism, and staring. Specific behaviors included:  Immobility/Stupor: No significant interaction with the external environment  Mutism: No speech observed  Staring: Gaze held longer than 20 seconds, occasionally shifting attention  Posturing/Catalepsy: Absent  Grimacing: Less than 10 seconds observed  Negativism: Mild resistance observed  The patient was unable to participate in psychiatric evaluation.   Following the administration of lorazepam (Ativan), there was an increase in excitement and rigidity, while her physical exam remained unchanged. Nystagmus and 2+ clonus were noted on DTR testing. On reassessment, the patient presented as Following the administration of a 2mg  IV lorazepam (Ativan) push, the patient became more alert and responsive. There was a noticeable decrease in rigidity, and she was able to communicate more effectively. 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 At one point, she became tearful and expressed, "I am coming back. I can do this." She also responded to a  question regarding DNR, stating "no" in a firm, almost adamant manner, as if she had grown weary of the repeated conversation.  All questions and concerns were addressed, and I explained to the patient that she appeared to be in a catatonic state. Upon hearing this, she began to cry again. The patient is very spiritual and expressed a strong desire not to give up. She continues to show signs of emotional distress but also resilience.  Updated Bush-Francis Catatonia Rating Scale (BFCS)  Total Score: 8  Inputs:  Excitement: 0 = Absent  Immobility/Stupor: 1 = Sits abnormally still; may interact briefly  Mutism: 1 = Verbally unresponsive to majority of questions; incomprehensible whisper  Staring: 1 = Poor eye contact; repeatedly gazes <20 sec between shifting of attention; decreased blinking  Posturing/Catalepsy: 0 = Absent  Grimacing: 0 = Absent  Echopraxia/Echolalia: 1 = Occasional  Stereotypy: 0 = Absent  Mannerisms: 0 = Absent  Verbigeration: 1 = Occasional  Rigidity: 0 = Absent  Negativism: 0 = Absent  Waxy Flexibility: 0 = Absent  Withdrawal: 3 = No PO intake/interaction for >=1 day  The patient was initially nonverbal. She would not even respond with head nods or shakes. Although she opened her eyes and observed the room, she denied external stimuli.   Catatonia Screening: Given the patient's altered behavior, responsiveness, and psychiatric presentation, a catatonia screening was performed. Initial BFCS score: 13. After lorazepam administration, the score decreased to 8, suggesting improvement in catatonia symptoms.  Psychiatric Assessment:Following the administration of a 2mg  IV lorazepam (Ativan) push, the patient became more alert and responsive. There was a noticeable decrease in rigidity, and she was able to communicate more effectively. 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 At one point, she became tearful and  expressed, "I am coming back. I can do this." She also responded to a question regarding DNR, stating "no" in a firm, almost adamant manner, as if she had grown weary of the repeated conversation.  All questions and concerns were addressed, and I explained to the patient that she appeared to be in a catatonic state. Upon hearing this, she began to cry again. The patient is very spiritual and expressed a strong desire not to give up. She continues to show signs of emotional distress but also resilience.  Treatment plan:  Ativan 2mg  IV q6hr schedule Ativan 1mg  IV q8hr prn.  Monitor for side effects of ativan to include sedation, respiratory depression, and nutrition.  Continue to encourage meals and oral hydration. Patients lips were chapped.  Will address depressive symptoms once patient is no longer in a catatonic state and able to participate in full psych eval.  Psychiatry will continue to follow along with primary team for catatonia and possible depression. Recommend full work as none of her current symptoms seem to be at baseline.

## 2023-11-28 NOTE — Progress Notes (Signed)
 Patient let RN obtain BG-- BG was 85 mg/dL.  Patient very teary eye and nodding her when asked questions.

## 2023-11-28 NOTE — Consult Note (Signed)
 Consultation Note Date: 11/28/2023   Patient Name: [name] [name]  DOB: 1966-05-23  MRN: [id number]  Age / Sex: 58 y.o., female  PCP: Alwyn Juba., MD Referring Physician: Modena Andes, MD  Reason for Consultation: Establishing goals of care  HPI/Patient Profile: 58 y.o. female  with past medical history of end-stage renal disease on hemodialysis, GERD, essential hypertension, left eye blindness, diabetes type 2, history of lumbar radiculopathy, admitted on 11/21/2023 with left heel wound and suspicion for possible gangrene.   Patient initially admitted with foot ulcer infection and cellulitis, symptomatic anemia. MRI then showed osteomyelitis and after initially refusing BKA, patient agreed. She also has refused labs, vital signs, medications and personal care.   PMT has been re-consulted to assist with goals of care conversation.  Clinical Assessment and Goals of Care:  I have reviewed medical records including EPIC notes, labs and imaging, discussed with MD, assessed the patient and then had a phone conversation with patient's husband Amye Baller to discuss diagnosis prognosis, GOC, EOL wishes, disposition and options.  I introduced Palliative Medicine as specialized medical care for people living with serious illness. It focuses on providing relief from the symptoms and stress of a serious illness. The goal is to improve quality of life for both the patient and the family.  We discussed a brief life review of the patient and then focused on their current illness.   I attempted to elicit values and goals of care important to the patient.    Medical History Review and Understanding:  We discussed the importance of patient's medications and complying with lab-work to  monitor her chronic comorbidities, as well as risk for decompensation and death without accurate information or her participation in exams. Patient did not answer while her husband was overwhelmed  to hear this on the phone.  Social History: Patient has resided at LTC for the past few months. She is married. Per chart, she has a son and 2 daughters as well.  Functional and Nutritional State: Albumin  of 2.0 on 5/25.   Discussion: I spoke again with patient today, expressing my apologies for returning to attempt GOC discussion after her request not to and then explaining medical team's concerns. Emphasized importance of aligning her treatment plan with her goals and wishes. Explained that she can choose between comfort care (skipping labs, medications etc to focus on rest, let nature take its course) and aggressive life-prolonging care (HD), but currently we are somewhere in the middle. Attempted to glean whether she has a preference for one or the other but she did not respond. She squinted at me briefly when I reviewed the risks of skipping her medications and informed her that a full comfort path would also include stopping dialysis if she was ready. I shared that this would mean her life expectancy would be shorter and she would be at EOL. At that point, I proceeded to inform her that I would call her husband to support hima nd assist with navigating the decision-making process as her next of kin. Counseled that this is because she is unable to indicate that she understands her options or the consequences of her choice to refuse care. She did not respond.  I then called patient's husband and introduced role of palliative medicine as an extra layer of support and resource while navigating the decision-making process. Counseled on the options as stated above and provided him with update on my unsuccessful attempt to elicit her preferences. Explored their previous conversations and what he  feels she would want her care to look like at this point. He has only been able to get a few words out her yesterday and feels "I am not the one for that." He does not feel comfortable making decisions on her  behalf and would defer to her. He also feels that it would be "giving up on her" to transition to hospice philosophy. Reassurance was provided, emphasizing that ongoing workup is continuing including psych consult and that there may be other causes for her current state including stroke. Shared that we would support whatever decision patient wanted, whether to continue aggressive care or transition to comfort care. He was unable to talk further at this point and requested to call again later. I provided with PMT contact information.   The difference between aggressive medical intervention and comfort care was considered in light of the patient's goals of care. Hospice and Palliative Care services outpatient were explained and offered.   Discussed the importance of continued conversation with family and the medical providers regarding overall plan of care and treatment options, ensuring decisions are within the context of the patient's values and GOCs.   Questions and concerns were addressed. The family was encouraged to call with questions or concerns.  PMT will continue to support holistically.   SUMMARY OF RECOMMENDATIONS   -Continue full code/full scope treatment -Patient will not participate in GOC discussion on several days of attempts. Her husband does not want to make decisions on her behalf -Patient may benefit from psych consult for what appears to be severe depression with catatonia, discussed with primary MD -Psychosocial and emotional support provided -PMT will continue to follow   Prognosis:  Unable to determine  Discharge Planning: LTC      Primary Diagnoses: Present on Admission:  Cellulitis of left lower extremity  Diabetic neuropathy (HCC)  Essential hypertension  GERD (gastroesophageal reflux disease)  ESRD (end stage renal disease) (HCC)  Anemia due to chronic kidney disease   Physical Exam Vitals and nursing note reviewed.  Constitutional:      General: She  is not in acute distress.    Appearance: She is ill-appearing.  Cardiovascular:     Rate and Rhythm: Normal rate.  Pulmonary:     Effort: Pulmonary effort is normal.  Neurological:     Mental Status: She is lethargic.  Psychiatric:        Mood and Affect: Affect is flat.        Behavior: Behavior is withdrawn.    Vital Signs: BP (!) 152/59 (BP Location: Left Arm)   Pulse 77   Temp 99.6 F (37.6 C) (Oral)   Resp 17   Ht 5' (1.524 m)   Wt 60.2 kg   SpO2 95%   BMI 25.92 kg/m  Pain Scale: PAINAD   Pain Score: 7    SpO2: SpO2: 95 % O2 Device:SpO2: 95 % O2 Flow Rate: .O2 Flow Rate (L/min): 2 L/min   MDM: High   Ladaija Dimino Alroy Jericho, PA-C  Palliative Medicine Team Team phone # [phone]  Thank you for allowing the Palliative Medicine Team to assist in the care of this patient. Please utilize secure chat with additional questions, if there is no response within 30 minutes please call the above phone number.  Palliative Medicine Team providers are available by phone from 7am to 7pm daily and can be reached through the team cell phone.  Should this patient require assistance outside of these hours, please call the patient's attending physician.

## 2023-11-29 ENCOUNTER — Encounter (HOSPITAL_COMMUNITY)

## 2023-11-29 ENCOUNTER — Encounter (HOSPITAL_COMMUNITY): Payer: Self-pay | Admitting: Orthopedic Surgery

## 2023-11-29 ENCOUNTER — Inpatient Hospital Stay (HOSPITAL_COMMUNITY)

## 2023-11-29 DIAGNOSIS — A419 Sepsis, unspecified organism: Secondary | ICD-10-CM | POA: Diagnosis not present

## 2023-11-29 DIAGNOSIS — E785 Hyperlipidemia, unspecified: Secondary | ICD-10-CM

## 2023-11-29 DIAGNOSIS — E11649 Type 2 diabetes mellitus with hypoglycemia without coma: Secondary | ICD-10-CM

## 2023-11-29 DIAGNOSIS — Z7901 Long term (current) use of anticoagulants: Secondary | ICD-10-CM

## 2023-11-29 DIAGNOSIS — I6523 Occlusion and stenosis of bilateral carotid arteries: Secondary | ICD-10-CM | POA: Diagnosis not present

## 2023-11-29 DIAGNOSIS — I639 Cerebral infarction, unspecified: Secondary | ICD-10-CM | POA: Diagnosis not present

## 2023-11-29 DIAGNOSIS — I953 Hypotension of hemodialysis: Secondary | ICD-10-CM

## 2023-11-29 DIAGNOSIS — L03116 Cellulitis of left lower limb: Secondary | ICD-10-CM | POA: Diagnosis not present

## 2023-11-29 DIAGNOSIS — D72829 Elevated white blood cell count, unspecified: Secondary | ICD-10-CM

## 2023-11-29 LAB — LIPID PANEL
Cholesterol: 132 mg/dL (ref 0–200)
HDL: 21 mg/dL — ABNORMAL LOW (ref >40)
LDL Cholesterol: 93 mg/dL (ref 0–99)
Total CHOL/HDL Ratio: 6.3 ratio
Triglycerides: 90 mg/dL (ref <150)
VLDL: 18 mg/dL (ref 0–40)

## 2023-11-29 LAB — CBC WITH DIFFERENTIAL/PLATELET
Abs Immature Granulocytes: 0.06 10*3/uL (ref 0.00–0.07)
Basophils Absolute: 0.1 10*3/uL (ref 0.0–0.1)
Basophils Relative: 1 %
Eosinophils Absolute: 0.3 10*3/uL (ref 0.0–0.5)
Eosinophils Relative: 2 %
HCT: 38.9 % (ref 36.0–46.0)
Hemoglobin: 12.6 g/dL (ref 12.0–15.0)
Immature Granulocytes: 0 %
Lymphocytes Relative: 14 %
Lymphs Abs: 1.9 10*3/uL (ref 0.7–4.0)
MCH: 24.5 pg — ABNORMAL LOW (ref 26.0–34.0)
MCHC: 32.4 g/dL (ref 30.0–36.0)
MCV: 75.5 fL — ABNORMAL LOW (ref 80.0–100.0)
Monocytes Absolute: 0.9 10*3/uL (ref 0.1–1.0)
Monocytes Relative: 7 %
Neutro Abs: 10.7 10*3/uL — ABNORMAL HIGH (ref 1.7–7.7)
Neutrophils Relative %: 76 %
Platelets: 347 10*3/uL (ref 150–400)
RBC: 5.15 MIL/uL — ABNORMAL HIGH (ref 3.87–5.11)
RDW: 29 % — ABNORMAL HIGH (ref 11.5–15.5)
Smear Review: NORMAL
WBC: 14 10*3/uL — ABNORMAL HIGH (ref 4.0–10.5)
nRBC: 0 % (ref 0.0–0.2)

## 2023-11-29 LAB — GLUCOSE, CAPILLARY
Glucose-Capillary: 103 mg/dL — ABNORMAL HIGH (ref 70–99)
Glucose-Capillary: 95 mg/dL (ref 70–99)
Glucose-Capillary: 108 mg/dL — ABNORMAL HIGH (ref 70–99)

## 2023-11-29 MED ORDER — THIAMINE MONONITRATE 100 MG PO TABS
100.0000 mg | ORAL_TABLET | Freq: Every day | ORAL | Status: DC
  Administered 2023-11-30 – 2023-12-05 (×6): 100 mg via ORAL
  Filled 2023-11-29 (×7): qty 1

## 2023-11-29 MED ORDER — IOHEXOL 350 MG/ML SOLN
75.0000 mL | Freq: Once | INTRAVENOUS | Status: AC | PRN
  Administered 2023-11-29: 75 mL via INTRAVENOUS

## 2023-11-29 MED ORDER — PROCHLORPERAZINE EDISYLATE 10 MG/2ML IJ SOLN
10.0000 mg | Freq: Four times a day (QID) | INTRAMUSCULAR | Status: DC | PRN
  Administered 2023-11-29 – 2023-12-04 (×3): 10 mg via INTRAVENOUS
  Filled 2023-11-29 (×3): qty 2

## 2023-11-29 MED ORDER — NALOXONE HCL 0.4 MG/ML IJ SOLN
0.4000 mg | INTRAMUSCULAR | Status: DC | PRN
  Administered 2023-11-29: 0.4 mg via INTRAVENOUS

## 2023-11-29 MED ORDER — HYDROMORPHONE HCL 1 MG/ML IJ SOLN
INTRAMUSCULAR | Status: AC
Start: 2023-11-29 — End: ?
  Filled 2023-11-29: qty 1

## 2023-11-29 NOTE — Significant Event (Signed)
 Rapid Response Event Note   Reason for Call :  "HD pt in 5C03 may need narcan ", called by HD CN, bringing Narcan  to room  Initial Focused Assessment:  Opens eyes minimally to painful stimuli, respiratory rate 6-10 upon arrival. Narcan  given immediately. Patient then oriented x2-3 and able to follow commands. Vitals remain stable. Patient was taken off treatment approximately 30 min early due to SBP 80s (completed 3h) and pulled 1.4L off. Patient then complained of pain so IV dilaudid  was given--see MAR. A few mine later, HD RN noted decreased respiratory drive, and CN/rapid RN was called.   Interventions/Plan of Care:  Narcan  0.4mg  IV x1  Event Summary:  MD Notified:  Call Time: 1357 Arrival Time: 1400 End Time: 1415  Ever Hiss, RN

## 2023-11-29 NOTE — Plan of Care (Signed)
  Problem: Education: Goal: Knowledge of General Education information will improve Description: Including pain rating scale, medication(s)/side effects and non-pharmacologic comfort measures 11/29/2023 0616 by Francena Infield, RN Outcome: Progressing 11/29/2023 0518 by Francena Infield, RN Outcome: Progressing   Problem: Fluid Volume: Goal: Ability to maintain a balanced intake and output will improve 11/29/2023 0616 by Francena Infield, RN Outcome: Progressing 11/29/2023 0518 by Francena Infield, RN Outcome: Progressing   Problem: Metabolic: Goal: Ability to maintain appropriate glucose levels will improve 11/29/2023 0616 by Francena Infield, RN Outcome: Progressing 11/29/2023 0518 by Francena Infield, RN Outcome: Progressing   Problem: Tissue Perfusion: Goal: Adequacy of tissue perfusion will improve 11/29/2023 0616 by Francena Infield, RN Outcome: Progressing 11/29/2023 0518 by Francena Infield, RN Outcome: Progressing   Problem: Education: Goal: Knowledge of disease or condition will improve 11/29/2023 0616 by Francena Infield, RN Outcome: Progressing 11/29/2023 0518 by Francena Infield, RN Outcome: Progressing Goal: Knowledge of secondary prevention will improve (MUST DOCUMENT ALL) 11/29/2023 0616 by Francena Infield, RN Outcome: Progressing 11/29/2023 0518 by Francena Infield, RN Outcome: Progressing Goal: Knowledge of patient specific risk factors will improve (DELETE if not current risk factor) 11/29/2023 0616 by Francena Infield, RN Outcome: Progressing 11/29/2023 0518 by Francena Infield, RN Outcome: Progressing

## 2023-11-29 NOTE — Progress Notes (Signed)
 Unit admission notes  21:25H Received patient from 6N on bed accompanied by nurse and NT. Alert, answers questions with delays due to drowsiness.Complaining of pain.  Not on respiratory distress.   Safety protocols initiated: bed wheels locked, side rails up, bed alarm turned on, call bell within reach and room orientation done.  Patient with VAC dressing on left leg. Leak noted from the dressing. No output from the collection canister. Pressure injury and wounds noted. Refer to [facility] for assessment. With Hemodialysis Catheter on left internal jugular. Hooked on telebox #[phone]

## 2023-11-29 NOTE — Plan of Care (Signed)
   Problem: Education: Goal: Knowledge of General Education information will improve Description Including pain rating scale, medication(s)/side effects and non-pharmacologic comfort measures Outcome: Progressing   Problem: Clinical Measurements: Goal: Ability to maintain clinical measurements within normal limits will improve Outcome: Progressing   Problem: Clinical Measurements: Goal: Will remain free from infection Outcome: Progressing

## 2023-11-29 NOTE — Progress Notes (Signed)
 23:58H - Patient left for CT on bed. Accompanied by transport service. 00:10H - Received secure message from [facility]. Patient is nauseous and can't lie flat on bed due to risk of aspiration. According to RT [facility], she will send back patient and will try again CT in the morning. 00:20H -  Patient back on the room. Still drowsy but able to follow commands and answer simple questions with delays. 00:24H - PRN dose of Zofran  IV given.

## 2023-11-29 NOTE — Progress Notes (Signed)
 PT Cancellation Note  Patient Details Name: ALEXXA SABET MRN: [id number] DOB: 01-14-1966   Cancelled Treatment:    Reason Eval/Treat Not Completed: Patient at procedure or test/unavailable - at HD, PT to check back post-HD as schedule allows.  Aleena Kirkeby S, PT DPT Acute Rehabilitation Services Secure Chat Preferred  Office [phone]    Ethridge Herder 11/29/2023, 12:26 PM

## 2023-11-29 NOTE — TOC Progression Note (Signed)
 Transition of Care [facility]) - Progression Note    Patient Details  Name: [name] [name] MRN: [id number] Date of Birth: 08/31/1965  Transition of [facility]) CM/SW Contact  Tandy Fam, [place] Phone Number: 11/29/2023, 11:07 AM  Clinical Narrative:   CSW received update that [facility] is requesting peer to peer for SNF. Patient not medically stable for discharge at this time, CSW contacted CMA to cancel auth request and will reinitiate when patient is stable. CSW to follow.    Expected Discharge Plan: Skilled Nursing Facility Barriers to Discharge: Continued Medical Work up  Expected Discharge Plan and Services       Living arrangements for the past 2 months: Skilled Nursing Facility                                       Social Determinants of Health (SDOH) Interventions SDOH Screenings   Food Insecurity: No Food Insecurity (11/21/2023)  Housing: Unknown (11/21/2023)  Transportation Needs: No Transportation Needs (11/21/2023)  Utilities: Not At Risk (11/21/2023)  Social Connections: Unknown (11/15/2021)   Received from [facility]  Tobacco Use: Low Risk  (11/25/2023)    Readmission Risk Interventions     No data to display

## 2023-11-29 NOTE — Plan of Care (Signed)
  Problem: Clinical Measurements: Goal: Ability to maintain clinical measurements within normal limits will improve Outcome: Progressing Goal: Will remain free from infection Outcome: Progressing Goal: Diagnostic test results will improve Outcome: Progressing Goal: Respiratory complications will improve Outcome: Progressing Goal: Cardiovascular complication will be avoided Outcome: Progressing   Problem: Elimination: Goal: Will not experience complications related to bowel motility Outcome: Progressing Goal: Will not experience complications related to urinary retention Outcome: Progressing   Problem: Elimination: Goal: Will not experience complications related to bowel motility Outcome: Progressing Goal: Will not experience complications related to urinary retention Outcome: Progressing   Problem: Pain Managment: Goal: General experience of comfort will improve and/or be controlled Outcome: Progressing   Problem: Safety: Goal: Ability to remain free from injury will improve Outcome: Progressing   Problem: Skin Integrity: Goal: Risk for impaired skin integrity will decrease Outcome: Progressing   Problem: Pain Management: Goal: Pain level will decrease with appropriate interventions Outcome: Progressing

## 2023-11-29 NOTE — Progress Notes (Signed)
 SLP Cancellation Note  Patient Details Name: [name] [name] MRN: [id number] DOB: 19-Dec-1965   Cancelled treatment:       Reason Eval/Treat Not Completed: Patient at procedure or test/unavailable Pt in HD. Will continue efforts.   Jodilyn, Giese 11/29/2023, 1:49 PM

## 2023-11-29 NOTE — Progress Notes (Signed)
 Pt goal not met . D/t pt had hypotension 82/61 mmhg during Tx. Pt c/o leg pain and after  Dilaudid  1mg  iv given. 3mins later O2 sat decreased to 80 %. Blood returned . Rapid response called. Narcan  0.4 mg IV given.Rechecked  BP 160/75. HR 66 R 15. O2 sat 100 % sent back to her room at stable condition.   11/29/23 1430  Vitals  Temp (!) 97.5 F (36.4 C)  Temp Source Oral  BP (!) 160/75  BP Location Left Arm  BP Method Automatic  Patient Position (if appropriate) Lying  Pulse Rate 63  Resp 15  During Treatment Monitoring  Intra-Hemodialysis Comments See progress note  Post Treatment  Dialyzer Clearance Lightly streaked  Hemodialysis Intake (mL) 0 mL  Liters Processed 74  Fluid Removed (mL) 1400 mL  Tolerated HD Treatment Yes  Post-Hemodialysis Comments see notes.  Hemodialysis Catheter Left Internal jugular  Placement Date/Time: 10/13/20 1501   Time Out: Correct patient;Correct site;Correct procedure  Maximum sterile barrier precautions: Hand hygiene;Cap;Mask  Site Prep: Other (comment)  Local Anesthetic: Injectable - 1% Lidocaine   Ultrasound Used?: No  V...  Site Condition No complications  Blue Lumen Status Heparin  locked  Red Lumen Status Heparin  locked  Catheter fill solution Heparin  1000 units/ml  Catheter fill volume (Arterial) 1.9 cc  Catheter fill volume (Venous) 1.9  Dressing Type Transparent  Dressing Status Antimicrobial disc/dressing in place;Clean, Dry, Intact  Interventions New dressing;Dressing changed  Drainage Description None  Dressing Change Due 12/06/23  Post treatment catheter status Capped and Clamped

## 2023-11-29 NOTE — Consult Note (Signed)
 Reason for Consult: Evaluation for depression with concern for catatonia in the setting of poor oral intake, refusal of care, and minimal responsiveness.  Assessment: The Psychiatry Consult Liaison Service has been following the patient for evaluation and management of possible catatonia. At initial presentation, the patient exhibited decreased oral intake, mutism, psychomotor retardation, and refusal of care. A Bush-Francis Catatonia Rating Scale (BFCRS) was administered and was clinically significant. The patient demonstrated a positive response to an Ativan challenge, with observable improvement in engagement and oral intake following administration. This supported a working diagnosis of catatonia, and she was started on scheduled IV lorazepam.  However, during her inpatient course and further medical evaluation, neuroimaging revealed multiple acute bilateral punctate nonhemorrhagic infarcts in multiple vascular distributions, as well as progressive periventricular and subcortical white matter hyperintensities with extension into the brainstem, and moderate generalized atrophy. These findings are consistent with multifocal ischemic strokes in the context of underlying progressive chronic microvascular disease.  Given the complexity of her presentation, it is now evident that some of the overlapping neuropsychiatric symptoms--such as mutism, poor oral intake, and reduced responsiveness--may be attributable to multifocal stroke rather than primary catatonia alone. Although the patient showed partial improvement with lorazepam, the acuity of her newly identified stroke warrants continued management under the primary Stroke Neurology team.  Plan:  Given the change in clinical picture and identification of multifocal acute infarcts, psychiatry defers further management to the Stroke Neurology service.  No additional psychiatric interventions are indicated at this time. May consider oral ativan if  catatonia symptoms reman present.   Psychiatry to sign off with the understanding that the patient will continue to receive multidisciplinary care and further neurological evaluation.  Should behavioral symptoms or psychiatric concerns arise that cannot be addressed within the scope of neurology, we are available to reassess.  Disposition: Psychiatry Consult Liaison Service signing off at this time. No acute psychiatric safety concerns identified currently.

## 2023-11-29 NOTE — Progress Notes (Signed)
 PROGRESS NOTE    [name] [name]  UXL:[id number] DOB: [birth year] DOA: 11/21/2023 PCP: Alwyn Juba., MD   Brief Narrative:  The patient 58 year old African-American female with a past medical history significant for ESRD on hemodialysis Tuesday Thursday Saturdays, GERD, essential hypertension, left eye blindness, diabetes mellitus type 2 which is fairly well-controlled, history of lumbar radiculopathy presented from her SNF from [facility] with a left heel wound and cellulitis and suspicion for possible and gas-forming infection. She has been undergoing wound care treatments but states that the foot has gotten worse. Workup in the ED shows a wound infection and cellulitis but there is concern for deeper infection so we will obtain an MRI of the heel and foot and obtain vascular ABIs. On presentation she was found to have a hemoglobin of 6.1 with no obvious source of bleeding so she was transfused 1 unit of PRBC.  Nephrology and orthopedics consulted.  Assessment & Plan:   Principal Problem:   Cellulitis of left lower extremity Active Problems:   Diabetic neuropathy (HCC)   Essential hypertension   GERD (gastroesophageal reflux disease)   ESRD (end stage renal disease) (HCC)   DM2 (diabetes mellitus, type 2) (HCC)   Anemia due to chronic kidney disease   Osteomyelitis of left foot (HCC)  Infected Diabetic Foot ulcer with Cellulitis/gangrene and gas-forming infection of the left heel/osteomyelitis: The unstageable pressure heel left ulcer with a wound bed black eschar.  States that she sees the wound clinic but is not getting better.  she received Unasyn  and linezolid  in the ED and was later started on cefepime  and vancomycin .  Blood cultures x 2 were obtained and NGTD.  MRI confirms osteomyelitis.  Ortho consulted, patient seen by Dr. 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  Has wound  VAC attached, suction is good per Dr. Julio Ohm.  Antibiotic discontinued on 11/28/2023.  Multifocal stroke: Postsurgery, patient had intermittent somnolence and not having proper conversations.  Initially it was being suspected that patient was severely depressed however it was noted on 11/28/2023 that patient appeared to be making efforts to talk but she just could not and could not follow commands or left extremities and thus stroke was suspected and stat MRI was ordered which confirmed multifocal ischemic stroke.  Neurology was consulted, CTA head and neck is pending.  They recommended Plavix once hemoglobin is stable as patient has allergy listed to aspirin .  Lipid panel within normal range.  Defer to neurology for further management.  Appreciate their help.  ?  Depression/catatonia: Due to patient's unwillingness or inability to talk and refusal of labs and medical care, psychiatry was consulted on the same day of 11/28/2023, they suspected catatonia, patient was given Ativan  and she improved making a diagnosis of catatonia.  She is still having intermittent symptoms.  Psychiatry following.  Appreciate their help.   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  Intermittent hypoglycemia in the setting of poor p.o. intake.  C/w Sensitive Novolog  SSI AC/HS. Has been refusing her Insulin  regimen.    HypoNa+: Mild, asymptomatic, improving.   Essential HTN: Blood pressure fairly controlled.  C/w Amlodipine  10 mg po Daily and Metoprolol  Tartrate 50 mg po BID.    ESRD on HD TTSat: Nephrology following.  Appreciate their help with management of this.   Symptomatic Anemia/Microcytic Anemia/Anemia of Chronic Kidney Disease: Hemoglobin 6.1 upon arrival, status post  1 unit of PRBC transfusion, hemoglobin improved to 8.4 but dropped to 6.0 on 11/27/2023 for which she received 2 units of PRBC transfusion in dialysis unit.  Patient continued to refuse blood draws and finally we  were able to get labs on her on the afternoon of 11/28/2023 and hemoglobin is 11.8.  Repeat CBC today.  Thrombocytosis: Improved.  GOC: Palliative care following.  Now that the cause of patient's inability to talk is diagnosed/catatonia, I hope she will be able to have better conversation with palliative care.  However per psychiatry note, patient was very clear in her intention to remain full code.  DVT prophylaxis: SCD's Start: 11/26/23 0743 SCDs Start: 11/21/23 2128   Code Status: Full Code  Family Communication:  None present at bedside.  Will call husband later today.  Status is: Inpatient Remains inpatient appropriate because: Status post BKA, anemia, stroke, catatonia.  Estimated body mass index is 27.21 kg/m as calculated from the following:   Height as of this encounter: 5' (1.524 m).   Weight as of this encounter: 63.2 kg.  Pressure Injury 11/28/23 Sacrum Stage 3 -  Full thickness tissue loss. Subcutaneous fat may be visible but bone, tendon or muscle are NOT exposed. (Active)  11/28/23 0931  Location: Sacrum  Location Orientation:   Staging: Stage 3 -  Full thickness tissue loss. Subcutaneous fat may be visible but bone, tendon or muscle are NOT exposed.  Wound Description (Comments):   Present on Admission:   Dressing Type Foam - Lift dressing to assess site every shift;Other (Comment) (xerofoam) 11/28/23 2130   Nutritional Assessment: Body mass index is 27.21 kg/m.Aaron Aas Seen by dietician.  I agree with the assessment and plan as outlined below: Nutrition Status:        . Skin Assessment: I have examined the patient's skin and I agree with the wound assessment as performed by the wound care RN as outlined below: Pressure Injury 11/28/23 Sacrum Stage 3 -  Full thickness tissue loss. Subcutaneous fat may be visible but bone, tendon or muscle are NOT exposed. (Active)  11/28/23 0931  Location: Sacrum  Location Orientation:   Staging: Stage 3 -  Full thickness tissue  loss. Subcutaneous fat may be visible but bone, tendon or muscle are NOT exposed.  Wound Description (Comments):   Present on Admission:   Dressing Type Foam - Lift dressing to assess site every shift;Other (Comment) (xerofoam) 11/28/23 2130    Consultants:  Orthopedics  Procedures:  BKA Antimicrobials:  Anti-infectives (From admission, onward)    Start     Dose/Rate Route Frequency Ordered Stop   11/29/23 1200  vancomycin  (VANCOREADY) IVPB 750 mg/150 mL  Status:  Discontinued        750 mg 150 mL/hr over 60 Minutes Intravenous Every T-Th-Sa (Hemodialysis) 11/27/23 1150 11/28/23 0902   11/27/23 1400  vancomycin  (VANCOREADY) IVPB 750 mg/150 mL        750 mg 150 mL/hr over 60 Minutes Intravenous  Once 11/27/23 1150 11/27/23 1757   11/26/23 1708  vancomycin  variable dose per unstable renal function (pharmacist dosing)  Status:  Discontinued         Does not apply See admin instructions 11/26/23 1710 11/27/23 1151   11/26/23 1200  vancomycin  (VANCOREADY) IVPB 750 mg/150 mL  Status:  Discontinued        750 mg 150 mL/hr over 60 Minutes Intravenous Every T-Th-Sa (Hemodialysis) 11/25/23 1015 11/26/23 1710   11/25/23 1109  ceFAZolin  (ANCEF ) 2-4 GM/100ML-% IVPB  Note to Pharmacy: [name], [name]: cabinet override      11/25/23 1109 11/25/23 1303   11/25/23 0600  ceFAZolin  (ANCEF ) IVPB 2g/100 mL premix        2 g 200 mL/hr over 30 Minutes Intravenous On call to O.R. 11/25/23 0214 11/25/23 1329   11/24/23 1200  vancomycin  (VANCOREADY) IVPB 750 mg/150 mL        750 mg 150 mL/hr over 60 Minutes Intravenous Every T-Th-Sa (Hemodialysis) 11/22/23 1437 11/24/23 1253   11/23/23 1330  vancomycin  (VANCOREADY) IVPB 750 mg/150 mL        750 mg 150 mL/hr over 60 Minutes Intravenous  Once 11/23/23 1243 11/23/23 1529   11/22/23 0600  Vancomycin  (VANCOCIN ) 1,250 mg in sodium chloride  0.9 % 250 mL IVPB        1,250 mg 166.7 mL/hr over 90 Minutes Intravenous  Once 11/21/23 2122 11/22/23 0714    11/22/23 0200  piperacillin -tazobactam (ZOSYN ) IVPB 2.25 g  Status:  Discontinued        2.25 g 100 mL/hr over 30 Minutes Intravenous Every 8 hours 11/21/23 2122 11/21/23 2126   11/21/23 2230  ceFEPIme  (MAXIPIME ) 1 g in sodium chloride  0.9 % 100 mL IVPB  Status:  Discontinued        1 g 200 mL/hr over 30 Minutes Intravenous Every 24 hours 11/21/23 2126 11/28/23 0902   11/21/23 2120  vancomycin  variable dose per unstable renal function (pharmacist dosing)  Status:  Discontinued         Does not apply See admin instructions 11/21/23 2122 11/25/23 1015   11/21/23 1600  Ampicillin -Sulbactam (UNASYN ) 3 g in sodium chloride  0.9 % 100 mL IVPB       Placed in "And" Linked Group   3 g 200 mL/hr over 30 Minutes Intravenous  Once 11/21/23 1547 11/21/23 1740   11/21/23 1600  linezolid  (ZYVOX ) IVPB 600 mg       Placed in "And" Linked Group   600 mg 300 mL/hr over 60 Minutes Intravenous  Once 11/21/23 1547 11/21/23 1808         Subjective: Patient seen and examined.  Still somnolence, fell asleep twice during my conversation with when woken up, she is at least able to answer my questions appropriately.  Believe she is probably going to need aggressive treatment for catatonia.  Appreciate psychiatry for help with that.  Objective: Vitals:   11/29/23 0420 11/29/23 0500 11/29/23 0546 11/29/23 0735  BP: (!) 150/61  (!) 167/66 (!) 150/67  Pulse: (!) 54  (!) 59 (!) 59  Resp: 10  15 14   Temp: (!) 97.5 F (36.4 C)   97.7 F (36.5 C)  TempSrc: Oral   Axillary  SpO2: 100%  98% 99%  Weight:  63.2 kg    Height:        Intake/Output Summary (Last 24 hours) at 11/29/2023 0803 Last data filed at 11/28/2023 2200 Gross per 24 hour  Intake 60 ml  Output --  Net 60 ml    Filed Weights   11/27/23 0403 11/27/23 1110 11/29/23 0500  Weight: 64.7 kg 60.2 kg 63.2 kg    Examination:  General exam: Appears somnolent but comfortable Respiratory system: Clear to auscultation. Respiratory effort  normal. Cardiovascular system: S1 & S2 heard, RRR. No JVD, murmurs, rubs, gallops or clicks. No pedal edema. Gastrointestinal system: Abdomen is nondistended, soft and nontender. No organomegaly or masses felt. Normal bowel sounds heard. Central nervous system: Very somnolent. Extremities: Symmetric 5 x 5 power. Skin: No  rashes, lesions or ulcers.    Data Reviewed: I have personally reviewed following labs and imaging studies  CBC: Recent Labs  Lab 11/23/23 0631 11/24/23 0624 11/25/23 0759 11/27/23 1105 11/28/23 1224  WBC 19.8* 20.0* 19.3* 15.0* 17.4*  NEUTROABS 16.0* 15.4* 15.6*  --  14.8*  HGB 8.4* 7.8* 8.4* 6.0* 11.8*  HCT 26.7* 24.9* 27.3* 20.2* 35.1*  MCV 64.2* 64.7* 65.6* 65.4* 74.1*  PLT 343 356 322 356 320   Basic Metabolic Panel: Recent Labs  Lab 11/23/23 0631 11/24/23 0624 11/25/23 0759 11/27/23 1105  NA 133* 133* 134* 132*  K 3.9 4.1 3.9 4.4  CL 98 97* 96* 95*  CO2 24 24 24 22   GLUCOSE 152* 208* 180* 80  BUN 10 20 13  26*  CREATININE 2.14* 3.10* 2.23* 3.88*  CALCIUM  8.0* 7.5* 8.0* 7.4*  PHOS 1.2*  --  2.3* 3.5   GFR: Estimated Creatinine Clearance: 13.3 mL/min (A) (by C-G formula based on SCr of 3.88 mg/dL (H)). Liver Function Tests: Recent Labs  Lab 11/23/23 0631 11/24/23 0624 11/25/23 0759 11/27/23 1105  AST  --  15  --   --   ALT  --  9  --   --   ALKPHOS  --  92  --   --   BILITOT  --  0.7  --   --   PROT  --  6.3*  --   --   ALBUMIN  2.1* 2.0* 2.1* 2.0*   No results for input(s): "LIPASE", "AMYLASE" in the last 168 hours. Recent Labs  Lab 11/28/23 1846  AMMONIA 13   Coagulation Profile: No results for input(s): "INR", "PROTIME" in the last 168 hours. Cardiac Enzymes: No results for input(s): "CKTOTAL", "CKMB", "CKMBINDEX", "TROPONINI" in the last 168 hours. BNP (last 3 results) No results for input(s): "PROBNP" in the last 8760 hours. HbA1C: No results for input(s): "HGBA1C" in the last 72 hours.  CBG: Recent Labs  Lab  11/28/23 1159 11/28/23 1612 11/28/23 1711 11/28/23 2201 11/29/23 0625  GLUCAP 81 71 111* 88 103*   Lipid Profile: Recent Labs    11/29/23 0520  CHOL 132  HDL 21*  LDLCALC 93  TRIG 90  CHOLHDL 6.3   Thyroid  Function Tests: Recent Labs    11/28/23 1846  TSH 4.350   Anemia Panel: Recent Labs    11/28/23 1846  VITAMINB12 800   Sepsis Labs: No results for input(s): "PROCALCITON", "LATICACIDVEN" in the last 168 hours.   Recent Results (from the past 240 hours)  Blood Cultures x 2 sites     Status: None   Collection Time: 11/21/23  4:41 PM   Specimen: BLOOD LEFT ARM  Result Value Ref Range Status   Specimen Description   Final    BLOOD LEFT ARM Performed at [facility], [address], [place], [place] [address]    Special Requests   Final    BOTTLES DRAWN AEROBIC AND ANAEROBIC Blood Culture results may not be optimal due to an inadequate volume of blood received in culture bottles Performed at [facility], [address] [place], [place] [address]    Culture   Final    NO GROWTH 5 DAYS Performed at [facility], [address], [place], [place] [address]    Report Status 11/26/2023 FINAL  Final  Culture, blood (Routine X 2) w Reflex to ID Panel     Status: None   Collection Time: 11/22/23 12:48 AM   Specimen: BLOOD  Result Value Ref Range Status   Specimen Description BLOOD RIGHT ANTECUBITAL  Final   Special Requests   Final    BOTTLES DRAWN AEROBIC AND ANAEROBIC Blood Culture adequate volume   Culture   Final    NO GROWTH 5 DAYS Performed at [facility], [address] [place], [place] [address]    Report Status 11/27/2023 FINAL  Final  Surgical pcr screen     Status: None   Collection Time: 11/25/23 11:31 AM   Specimen: Nasal Mucosa; Nasal Swab  Result Value Ref Range Status   MRSA, PCR NEGATIVE NEGATIVE Final   Staphylococcus aureus NEGATIVE NEGATIVE Final    Comment: (NOTE) The Xpert SA Assay (FDA approved  for NASAL specimens in patients 47 years of age and older), is one component of a comprehensive surveillance program. It is not intended to diagnose infection nor to guide or monitor treatment. Performed at [facility], [address], [place], [place] [address]      Radiology Studies: MR BRAIN WO CONTRAST Result Date: 11/28/2023 EXAM: MRI BRAIN WITHOUT CONTRAST 11/28/2023 11:14:22 AM TECHNIQUE: Multiplanar multisequence MRI of the head/brain was performed without the administration of intravenous contrast. COMPARISON: MR head without and with contrast 04/05/2022. CLINICAL HISTORY: Neuro deficit, acute, stroke suspected. FINDINGS: BRAIN AND VENTRICLES: Multiple bilateral punctate nonhemorrhagic infarcts are present. A punctate cortical infarct was present in the right middle frontal gyrus on image 42 of series 2. A new punctate white matter infarct is present in the posterior left centrum semiovale on image 37. A punctate white matter infarct is present in the posterior right corona radiata on image 35 with a second lesion more anterior on image 31. A subcortical punctate white matter infarct is present in the posterior right temporal lobe on image 23. A medial inferior posterior left cerebellar lesion is present on image 13. Periventricular and subcortical white matter T2 hyperintensities and moderate generalized atrophy demonstrate slight progression compared to the prior exam. White matter changes extend into the brainstem. Punctate foci of susceptibility again noted in the cerebellum bilaterally. No focal abnormality in the posterior right hippocampus, stable. ORBITS: Bilateral lens replacements are noted. The globes and orbits are otherwise within normal limits. SINUSES AND MASTOIDS: Fluid levels are present in the sphenoid sinuses, right greater than left. The paranasal sinuses and mastoid air cells are otherwise clear. BONES AND SOFT TISSUES: Normal marrow signal. No acute soft tissue  abnormality. IMPRESSION: 1. Multiple acute bilateral punctate nonhemorrhagic infarcts in multiple vascular distributions as described. 2. Slight progression of periventricular and subcortical white matter T2 hyperintensities and moderate generalized atrophy compared to the prior exam. White matter changes extend into the brainstem. Findings are consistent with progressive chronic microvascular disease. 3. Fluid levels in the sphenoid sinuses, right greater than left suggesting acute sinusitis . Electronically signed by: Audree Leas MD 11/28/2023 12:19 PM EDT RP Workstation: [id number]     Scheduled Meds:   stroke: early stages of recovery book   Does not apply Once   sodium chloride    Intravenous Once   sodium chloride    Intravenous Once   sodium chloride    Intravenous Once   amLODipine   5 mg Oral QPM   vitamin C  1,000 mg Oral Daily   calcitRIOL  0.25 mcg Oral Q T,Th,Sa-HD   calcium  carbonate  1 tablet Oral TID WC   Chlorhexidine  Gluconate Cloth  6 each Topical Daily   Chlorhexidine  Gluconate Cloth  6 each Topical Q0600   cloNIDine   0.1 mg Transdermal Weekly   [START ON 12/01/2023] darbepoetin (ARANESP ) injection - NON-DIALYSIS  200 mcg Subcutaneous Q Thu-1800   insulin  aspart  0-5 Units Subcutaneous QHS   insulin  aspart  0-6 Units Subcutaneous TID WC   LORazepam  2 mg Intravenous Q6H   metoprolol tartrate  50 mg Oral BID   nutrition supplement (JUVEN)  1 packet Oral BID BM   sodium chloride  flush  10-40 mL Intracatheter Q12H   thiamine (VITAMIN B1) injection  100 mg Intravenous Daily   zinc sulfate (50mg  elemental zinc)  220 mg Oral Daily   Continuous Infusions:  sodium chloride        LOS: 8 days   Modena Andes, MD [facility]  11/29/2023, 8:03 AM   *Please note that this is a verbal dictation therefore any spelling or grammatical errors are due to the "[employer] One" system interpretation.  Please page via Amion and do not message via secure chat for urgent  patient care matters. Secure chat can be used for non urgent patient care matters.  How to contact the [facility] Attending or Consulting provider 7A - 7P or covering provider during after hours 7P -7A, for this patient?  Check the care team in [facility] and look for a) attending/consulting [facility] provider listed and b) the [facility] team listed. Page or secure chat 7A-7P. Log into [email or web address] and use [facility] universal password to access. If you do not have the password, please contact the hospital operator. Locate the [facility] provider you are looking for under [facility] and page to a number that you can be directly reached. If you still have difficulty reaching the provider, please page the [facility] (Director on Call) for the Hospitalists listed on amion for assistance.

## 2023-11-29 NOTE — Progress Notes (Signed)
 02:30  Patient was noted with episode of staring and gazing. Able to answer questions but with delay interaction. After giving dose of 2mg  IV Ativan patient seems more alert and able able to tell her needs.

## 2023-11-29 NOTE — Progress Notes (Signed)
 [facility] Progress Note   Subjective:   Pt sleeping but awakens to voice. States she wants to eat. Per RN, has been more alert after receiving ativan.   Objective Vitals:   11/29/23 0420 11/29/23 0500 11/29/23 0546 11/29/23 0735  BP: (!) 150/61  (!) 167/66 (!) 150/67  Pulse: (!) 54  (!) 59 (!) 59  Resp: 10  15 14   Temp: (!) 97.5 F (36.4 C)   97.7 F (36.5 C)  TempSrc: Oral   Axillary  SpO2: 100%  98% 99%  Weight:  63.2 kg    Height:       Physical Exam General: sleeping female, awakens to voice, NAD Heart: RRR, no murmurs, rubs or gallops Lungs: CTA bilaterally, respirations unlabored on RA Abdomen: Soft, non-distended, +BS Extremities: No edema b/l lower extremities Dialysis Access: internal jugular [facility]  Additional Objective Labs: Basic Metabolic Panel: Recent Labs  Lab 11/23/23 0631 11/24/23 0624 11/25/23 0759 11/27/23 1105  NA 133* 133* 134* 132*  K 3.9 4.1 3.9 4.4  CL 98 97* 96* 95*  CO2 24 24 24 22   GLUCOSE 152* 208* 180* 80  BUN 10 20 13  26*  CREATININE 2.14* 3.10* 2.23* 3.88*  CALCIUM  8.0* 7.5* 8.0* 7.4*  PHOS 1.2*  --  2.3* 3.5   Liver Function Tests: Recent Labs  Lab 11/24/23 0624 11/25/23 0759 11/27/23 1105  AST 15  --   --   ALT 9  --   --   ALKPHOS 92  --   --   BILITOT 0.7  --   --   PROT 6.3*  --   --   ALBUMIN  2.0* 2.1* 2.0*   No results for input(s): "LIPASE", "AMYLASE" in the last 168 hours. CBC: Recent Labs  Lab 11/23/23 0631 11/24/23 0624 11/25/23 0759 11/27/23 1105 11/28/23 1224  WBC 19.8* 20.0* 19.3* 15.0* 17.4*  NEUTROABS 16.0* 15.4* 15.6*  --  14.8*  HGB 8.4* 7.8* 8.4* 6.0* 11.8*  HCT 26.7* 24.9* 27.3* 20.2* 35.1*  MCV 64.2* 64.7* 65.6* 65.4* 74.1*  PLT 343 356 322 356 320   Blood Culture    Component Value Date/Time   SDES BLOOD LEFT HAND 11/28/2023 1918   SDES BLOOD LEFT HAND 11/28/2023 1918   SPECREQUEST  11/28/2023 1918    BOTTLES DRAWN AEROBIC ONLY Blood Culture results may not be optimal  due to an inadequate volume of blood received in culture bottles   SPECREQUEST  11/28/2023 1918    AEROBIC BOTTLE ONLY Blood Culture results may not be optimal due to an inadequate volume of blood received in culture bottles   CULT  11/28/2023 1918    NO GROWTH < 12 HOURS Performed at [facility], [address], [place], [place] [address]    CULT  11/28/2023 1918    NO GROWTH < 12 HOURS Performed at [facility], [address] [place], [place] [address]    REPTSTATUS PENDING 11/28/2023 1918   REPTSTATUS PENDING 11/28/2023 1918    Cardiac Enzymes: No results for input(s): "CKTOTAL", "CKMB", "CKMBINDEX", "TROPONINI" in the last 168 hours. CBG: Recent Labs  Lab 11/28/23 1159 11/28/23 1612 11/28/23 1711 11/28/23 2201 11/29/23 0625  GLUCAP 81 71 111* 88 103*   Iron  Studies: No results for input(s): "IRON ", "TIBC", "TRANSFERRIN", "FERRITIN" in the last 72 hours. @lablastinr3 @ Studies/Results: MR BRAIN WO CONTRAST Result Date: 11/28/2023 EXAM: MRI BRAIN WITHOUT CONTRAST 11/28/2023 11:14:22 AM TECHNIQUE: Multiplanar multisequence MRI of the head/brain was performed without the administration  of intravenous contrast. COMPARISON: MR head without and with contrast 04/05/2022. CLINICAL HISTORY: Neuro deficit, acute, stroke suspected. FINDINGS: BRAIN AND VENTRICLES: Multiple bilateral punctate nonhemorrhagic infarcts are present. A punctate cortical infarct was present in the right middle frontal gyrus on image 42 of series 2. A new punctate white matter infarct is present in the posterior left centrum semiovale on image 37. A punctate white matter infarct is present in the posterior right corona radiata on image 35 with a second lesion more anterior on image 31. A subcortical punctate white matter infarct is present in the posterior right temporal lobe on image 23. A medial inferior posterior left cerebellar lesion is present on image 13. Periventricular and subcortical white  matter T2 hyperintensities and moderate generalized atrophy demonstrate slight progression compared to the prior exam. White matter changes extend into the brainstem. Punctate foci of susceptibility again noted in the cerebellum bilaterally. No focal abnormality in the posterior right hippocampus, stable. ORBITS: Bilateral lens replacements are noted. The globes and orbits are otherwise within normal limits. SINUSES AND MASTOIDS: Fluid levels are present in the sphenoid sinuses, right greater than left. The paranasal sinuses and mastoid air cells are otherwise clear. BONES AND SOFT TISSUES: Normal marrow signal. No acute soft tissue abnormality. IMPRESSION: 1. Multiple acute bilateral punctate nonhemorrhagic infarcts in multiple vascular distributions as described. 2. Slight progression of periventricular and subcortical white matter T2 hyperintensities and moderate generalized atrophy compared to the prior exam. White matter changes extend into the brainstem. Findings are consistent with progressive chronic microvascular disease. 3. Fluid levels in the sphenoid sinuses, right greater than left suggesting acute sinusitis . Electronically signed by: Audree Leas MD 11/28/2023 12:19 PM EDT RP Workstation: [id number]   Medications:  sodium chloride       sodium chloride    Intravenous Once   sodium chloride    Intravenous Once   sodium chloride    Intravenous Once   amLODipine   5 mg Oral QPM   vitamin C  1,000 mg Oral Daily   calcitRIOL  0.25 mcg Oral Q T,Th,Sa-HD   calcium  carbonate  1 tablet Oral TID WC   Chlorhexidine  Gluconate Cloth  6 each Topical Daily   Chlorhexidine  Gluconate Cloth  6 each Topical Q0600   cloNIDine   0.1 mg Transdermal Weekly   [START ON 12/01/2023] darbepoetin (ARANESP ) injection - NON-DIALYSIS  200 mcg Subcutaneous Q Thu-1800   insulin  aspart  0-5 Units Subcutaneous QHS   insulin  aspart  0-6 Units Subcutaneous TID WC   LORazepam  2 mg Intravenous Q6H   metoprolol tartrate   50 mg Oral BID   nutrition supplement (JUVEN)  1 packet Oral BID BM   sodium chloride  flush  10-40 mL Intracatheter Q12H   thiamine (VITAMIN B1) injection  100 mg Intravenous Daily   zinc sulfate (50mg  elemental zinc)  220 mg Oral Daily    Dialysis Orders: GO TTS 3.75 hours  Heparin  5000 units BFR 350/ DFR 600 3K/2.5 Ca bath EDW 64 kg TDC Mircera 225 mcg q 2 weeks. Last dose 11/17/23 Calcitriol 0.25 mcg q HD  Assessment/Plan:  Left Foot wound - abx and imaging per primary team    ESRD  - HD per TTS schedule usually.  - She refused labs on 5/24; Corrected calcium  and phos at goal on last labs - Note that her EDW will need to decrease as she has had a BKA (also has a wound vac)   HTN -  - A bit hypertensive but has been refusing meds -  note on an atypical regimen outpatient including high dose clonidine  patch.  And taking multiple meds on non-HD days - Note we reordered her clonidine  patch at a dose of 0.1 mg for now (the 0.2 mg patch was never applied) - reduced amlodipine  to 5 mg nightly and stopped hydralazine  - continue metoprolol for now - however note that she has still refused metoprolol intermittently   Anemia of CKD  - on max ESA outpt, recently dosed on 5/15.    - 2 units PRBC's 5/25 for critical Hb 6.0 - Will plan for Aranesp  200 mcg on 5/29 if still here and then weekly on Thursdays.    Metabolic bone disease  - hypophosphatemia, now improved.  Defer binder. On calcitriol   Encephalopathy -neuro and psych on board, concern for multifocal stroke  -getting ativan challenge today  Ramona Burner, PA-C 11/29/2023, 10:06 AM  [facility] Pager: [phone]

## 2023-11-29 NOTE — Progress Notes (Signed)
 STROKE TEAM PROGRESS NOTE   SUBJECTIVE (INTERVAL HISTORY) No family is at the bedside.  Patient had dialysis today, still very lethargic but orientated and answer question appropriately.  Denies any headache or pain, left BKA wound dry with wound VAC.  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  Pending CTA head and neck.   OBJECTIVE Temp:  [97.5 F (36.4 C)-98 F (36.7 C)] 98 F (36.7 C) (05/27 1508) Pulse Rate:  [54-108] 57 (05/27 1508) Cardiac Rhythm: Sinus bradycardia (05/27 1051) Resp:  [10-19] 12 (05/27 1508) BP: (82-180)/(60-79) 132/66 (05/27 1508) SpO2:  [98 %-100 %] 100 % (05/27 1508) Weight:  [62 kg-63.2 kg] 62 kg (05/27 1433)  Recent Labs  Lab 11/28/23 1612 11/28/23 1711 11/28/23 2201 11/29/23 0625 11/29/23 1515  GLUCAP 71 111* 88 103* 95   Recent Labs  Lab 11/23/23 0631 11/24/23 0624 11/25/23 0759 11/27/23 1105  NA 133* 133* 134* 132*  K 3.9 4.1 3.9 4.4  CL 98 97* 96* 95*  CO2 24 24 24 22   GLUCOSE 152* 208* 180* 80  BUN 10 20 13  26*  CREATININE 2.14* 3.10* 2.23* 3.88*  CALCIUM  8.0* 7.5* 8.0* 7.4*  PHOS 1.2*  --  2.3* 3.5   Recent Labs  Lab 11/23/23 0631 11/24/23 0624 11/25/23 0759 11/27/23 1105  AST  --  15  --   --   ALT  --  9  --   --   ALKPHOS  --  92  --   --   BILITOT  --  0.7  --   --   PROT  --  6.3*  --   --   ALBUMIN  2.1* 2.0* 2.1* 2.0*   Recent Labs  Lab 11/23/23 0631 11/24/23 0624 11/25/23 0759 11/27/23 1105 11/28/23 1224 11/29/23 0520  WBC 19.8* 20.0* 19.3* 15.0* 17.4* 14.0*  NEUTROABS 16.0* 15.4* 15.6*  --  14.8* 10.7*  HGB 8.4* 7.8* 8.4* 6.0* 11.8* 12.6  HCT 26.7* 24.9* 27.3* 20.2* 35.1* 38.9  MCV 64.2* 64.7* 65.6* 65.4* 74.1* 75.5*  PLT 343 356 322 356 320 347   No results for input(s): "CKTOTAL", "CKMB", "CKMBINDEX", "TROPONINI" in the last 168 hours. No results for input(s): "LABPROT", "INR" in the last 72 hours. No results for input(s): "COLORURINE", "LABSPEC", "PHURINE", "GLUCOSEU", "HGBUR",  "BILIRUBINUR", "KETONESUR", "PROTEINUR", "UROBILINOGEN", "NITRITE", "LEUKOCYTESUR" in the last 72 hours.  Invalid input(s): "APPERANCEUR"     Component Value Date/Time   CHOL 132 11/29/2023 0520   TRIG 90 11/29/2023 0520   HDL 21 (L) 11/29/2023 0520   CHOLHDL 6.3 11/29/2023 0520   VLDL 18 11/29/2023 0520   LDLCALC 93 11/29/2023 0520   Lab Results  Component Value Date   HGBA1C 7.1 (H) 11/22/2023      Component Value Date/Time   LABOPIA NONE DETECTED 12/01/2008 1935   COCAINSCRNUR NONE DETECTED 12/01/2008 1935   COCAINSCRNUR NEG 09/25/2007 2039   LABBENZ NONE DETECTED 12/01/2008 1935   LABBENZ NEG 09/25/2007 2039   AMPHETMU NONE DETECTED 12/01/2008 1935   THCU NONE DETECTED 12/01/2008 1935   LABBARB  12/01/2008 1935    NONE DETECTED        DRUG SCREEN FOR MEDICAL PURPOSES ONLY.  IF CONFIRMATION IS NEEDED FOR ANY PURPOSE, NOTIFY LAB WITHIN 5 DAYS.        LOWEST DETECTABLE LIMITS FOR URINE DRUG SCREEN Drug Class       Cutoff (ng/mL) Amphetamine      1000 Barbiturate      200 Benzodiazepine   200 Tricyclics  300 Opiates          300 Cocaine          300 THC              50    No results for input(s): "ETH" in the last 168 hours.  I have personally reviewed the radiological images below and agree with the radiology interpretations.  MR BRAIN WO CONTRAST Result Date: 11/28/2023 EXAM: MRI BRAIN WITHOUT CONTRAST 11/28/2023 11:14:22 AM TECHNIQUE: Multiplanar multisequence MRI of the head/brain was performed without the administration of intravenous contrast. COMPARISON: MR head without and with contrast 04/05/2022. CLINICAL HISTORY: Neuro deficit, acute, stroke suspected. FINDINGS: BRAIN AND VENTRICLES: Multiple bilateral punctate nonhemorrhagic infarcts are present. A punctate cortical infarct was present in the right middle frontal gyrus on image 42 of series 2. A new punctate white matter infarct is present in the posterior left centrum semiovale on image 37. A punctate  white matter infarct is present in the posterior right corona radiata on image 35 with a second lesion more anterior on image 31. A subcortical punctate white matter infarct is present in the posterior right temporal lobe on image 23. A medial inferior posterior left cerebellar lesion is present on image 13. Periventricular and subcortical white matter T2 hyperintensities and moderate generalized atrophy demonstrate slight progression compared to the prior exam. White matter changes extend into the brainstem. Punctate foci of susceptibility again noted in the cerebellum bilaterally. No focal abnormality in the posterior right hippocampus, stable. ORBITS: Bilateral lens replacements are noted. The globes and orbits are otherwise within normal limits. SINUSES AND MASTOIDS: Fluid levels are present in the sphenoid sinuses, right greater than left. The paranasal sinuses and mastoid air cells are otherwise clear. BONES AND SOFT TISSUES: Normal marrow signal. No acute soft tissue abnormality. IMPRESSION: 1. Multiple acute bilateral punctate nonhemorrhagic infarcts in multiple vascular distributions as described. 2. Slight progression of periventricular and subcortical white matter T2 hyperintensities and moderate generalized atrophy compared to the prior exam. White matter changes extend into the brainstem. Findings are consistent with progressive chronic microvascular disease. 3. Fluid levels in the sphenoid sinuses, right greater than left suggesting acute sinusitis . Electronically signed by: Audree Leas MD 11/28/2023 12:19 PM EDT RP Workstation: [id number]   MR HEEL LEFT WO CONTRAST Result Date: 11/23/2023 CLINICAL DATA:  Diabetic foot swelling, tissue necrosis EXAM: MR OF THE LEFT HEEL WITHOUT CONTRAST TECHNIQUE: Multiplanar, multisequence MR imaging of the left heel was performed. No intravenous contrast was administered. COMPARISON:  Radiographs 11/21/2023 FINDINGS: Bones/Joint/Cartilage Abnormal edema  posteriorly and medially in the calcaneus compatible with osteomyelitis given the overlying ulceration and abnormal gas in the soft tissues. Marrow edema in the talar neck and adjacent talar body potentially from stress fracture or osteomyelitis. Mild degenerative subcortical findings at the calcaneocuboid articulation. Ligaments Attenuated anterior talofibular ligament, query prior tear. Muscles and Tendons Abnormal edema throughout the abductor hallucis muscle with some tenderness laxity along the myotendinous junction suspicious for tear. Regional muscular atrophy and low-grade edema probably neurogenic. Soft tissues Prominent plantar-lateral heel ulceration with gas tracking in the soft tissues and fluid infiltration as well extending below the calcaneus and superficial to the abductor digiti the minimi muscle in the subcutaneous tissues. Appearance compatible with necrotic tissue and gas-forming soft tissue infection. IMPRESSION: 1. Prominent plantar-lateral heel ulceration with gas tracking in the soft tissues and fluid infiltration as well extending below the calcaneus and superficial to the abductor digiti minimi muscle in the subcutaneous tissues. Appearance compatible  with necrotic tissue and gas-forming soft tissue infection. 2. Abnormal edema posteriorly and medially in the calcaneus compatible with osteomyelitis. 3. Marrow edema in the talar neck and adjacent talar body potentially from stress fracture or osteomyelitis. 4. Abnormal edema throughout the abductor hallucis muscle with some laxity along the myotendinous junction suspicious for tear. Regional muscular atrophy and low-grade edema probably neurogenic. 5. Attenuated anterior talofibular ligament, query prior tear. Electronically Signed   By: Freida Jes M.D.   On: 11/23/2023 10:30   MR FOOT LEFT WO CONTRAST Result Date: 11/23/2023 CLINICAL DATA:  Chronic heel/foot pain. Gas in the plantar soft tissues on radiographs 11/21/2023 EXAM:  MRI OF THE LEFT FOOT WITHOUT CONTRAST TECHNIQUE: Multiplanar, multisequence MR imaging of the left forefoot was performed. No intravenous contrast was administered. COMPARISON:  11/21/2023 radiographs FINDINGS: Bones/Joint/Cartilage Arthropathy of the proximal interphalangeal joint of the second toe with potential resorption of the head of the proximal phalanx, hyperflexion of the PIP joint, and edema signal especially in the distal half of the proximal phalanx and also on the base of the middle phalanx. Strictly speaking osteomyelitis is not excluded. No obvious joint effusion. Stress fracture of the proximal metaphysis of the first metatarsal with associated low-grade edema throughout the shaft of the first metatarsal. The stress fractures primarily visible laterally as on image 15 series 12. Marrow edema distally in the lateral cuneiform as on image 16 series 12, compatible with stress injury. Partially visualized findings in the hindfoot are reserved for the ankle MRI dictation. Ligaments Lisfranc ligament intact. Muscles and Tendons Abnormal muscular edema in the abductor hallucis muscle on 9 series 11 with some laxity along the myotendinous junction suspicious for muscle tear. Low-level edema within along remaining musculature of the forefoot likely neurogenic. Atrophic extensor digitorum brevis muscle. Soft tissues Loculations of gas an infiltrative fluid track along the plantar-lateral margin of the abductor digiti minimi muscle. Subcutaneous edema tracks along the plantar foot and to a lesser extent along the dorsal foot. IMPRESSION: 1. Loculations of gas and infiltrative fluid track along the plantar-lateral margin of the abductor digiti minimi muscle. This is compatible with soft tissue infection and gas-forming organism. 2. Abnormal muscular edema in the abductor hallucis muscle with some laxity along the myotendinous junction suspicious for muscle tear. 3. Stress fracture of the proximal metaphysis of  the first metatarsal with associated low-grade edema throughout the shaft of the first metatarsal. 4. Marrow edema distally in the lateral cuneiform, compatible with stress injury. 5. Arthropathy of the proximal interphalangeal joint of the second toe with potential resorption of the head of the proximal phalanx, hyperflexion of the PIP joint, and edema signal especially in the distal half of the proximal phalanx and also on the base of the middle phalanx. Strictly speaking osteomyelitis is not excluded. 6. Subcutaneous edema tracks along the plantar foot and to a lesser extent along the dorsal foot. 7. Partially visualized findings in the hindfoot are reserved for the ankle MRI dictation. Electronically Signed   By: Freida Jes M.D.   On: 11/23/2023 10:25   DG Foot Complete Left Result Date: 11/21/2023 CLINICAL DATA:  Infection.  Left foot gangrene. EXAM: LEFT FOOT - COMPLETE 3+ VIEW COMPARISON:  None Available. FINDINGS: The bones are diffusely under mineralized. Hammertoe deformity of the digits further limits assessment. Patchy subcutaneous gas in the hindfoot with the probable ulcer posterior to the calcaneus. Soft tissue gas is seen laterally the hindfoot and midfoot. There is no obvious bony destructive change, however the degree  of bony under mineralization limits assessment. Soft tissue ulcer may extend to bone involving the posterior calcaneus. No evidence of acute fracture. Generalized soft tissue edema with prominent vascular calcifications. Punctate densities in the plantar soft tissues may be calcifications or tiny radiopaque foreign bodies. IMPRESSION: 1. Probable soft tissue ulcer posterior to the calcaneus. Patchy soft tissue gas is seen in the plantar and lateral hindfoot and midfoot. 2. No obvious bony destructive change, however the degree of bony under mineralization limits assessment. Soft tissue ulcer may extend to bone involving the posterior calcaneus. 3. Generalized soft tissue  edema with prominent vascular calcifications. Punctate densities in the plantar soft tissues may be calcifications or tiny radiopaque foreign bodies. Electronically Signed   By: Chadwick Colonel M.D.   On: 11/21/2023 17:10     PHYSICAL EXAM  Temp:  [97.5 F (36.4 C)-98 F (36.7 C)] 98 F (36.7 C) (05/27 1508) Pulse Rate:  [54-108] 57 (05/27 1508) Resp:  [10-19] 12 (05/27 1508) BP: (82-180)/(60-79) 132/66 (05/27 1508) SpO2:  [98 %-100 %] 100 % (05/27 1508) Weight:  [62 kg-63.2 kg] 62 kg (05/27 1433)  General - Well nourished, well developed, in no apparent distress, lethargic.  Ophthalmologic - fundi not visualized due to noncooperation.  Cardiovascular - Regular rhythm and rate.  Neuro - awake, alert, eyes open, orientated to age, place, time. No aphasia but paucity of speech, following all simple commands. Able to name 3/3 and repeat simple sentences.  Limited bilateral eye movement, left eye blind with only some light perception, right eye tunnel vision.  Slight right facial droop. Tongue midline. Bilateral UEs 3/5, no drift. RLE on foot boot, 2-/5 and LLE PKA with wound VAC 1/5. Sensation symmetrical bilaterally, b/l FTN intact but slow, gait not tested.     ASSESSMENT/PLAN [name] [name] is a 58 y.o. female with history of diabetes, hypertension, anemia, ESRD on hemodialysis, left eye blind, left back pain admitted for left heel cellulitis.  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  Still very lethargic, generalized weakness postop, MRI showed multiple bilateral small infarcts.  No TNK given due to altered window.    Stroke:  bilateral punctate/small watershed infarcts, etiology likely large vessel source with bilateral ICA siphon high-grade stenosis in the setting of sepsis versus hypotension during dialysis MRI  Multiple acute bilateral punctate nonhemorrhagic infarcts in multiple vascular distributions, more consistent with watershed pattern CT head and neck pending 2D Echo   pending LDL 93 HgbA1c 7.1 SCDs for VTE prophylaxis Eliquis (apixaban) daily prior to admission, now on No antithrombotic given severe anemia needing blood transfusion Patient counseled to be compliant with her antithrombotic medications Ongoing aggressive stroke risk factor management Therapy recommendations:  SNF Disposition: Pending  DVT In 02/2023, LE venous Doppler showed Deep venous thrombus within the right profunda and superficial femoral veins.  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 access Continue statin at discharge  Dysphagia Pending swallow evaluation Now n.p.o.  Other Stroke Risk Factors ESRD on dialysis  Other Active Problems Severe anemia, hemoglobin 6.0 status post PRBC --11.8--12.6 Leukocytosis  Hospital day # 8  I discussed with Dr. Lilyan Remedies. I spent extensive total face-to-face time with the patient and family, reviewing test results, images and medication, and discussing the diagnosis, treatment plan and potential prognosis. This patient's care requiresreview of multiple databases, neurological assessment, discussion with  family, other specialists and medical decision making of high complexity.  Consuelo Denmark, MD PhD Stroke Neurology 11/29/2023 6:53 PM    To contact Stroke Continuity provider, please refer to [email or web address]. After hours, contact General Neurology

## 2023-11-30 ENCOUNTER — Inpatient Hospital Stay (HOSPITAL_COMMUNITY)

## 2023-11-30 DIAGNOSIS — I6389 Other cerebral infarction: Secondary | ICD-10-CM | POA: Diagnosis not present

## 2023-11-30 DIAGNOSIS — I639 Cerebral infarction, unspecified: Secondary | ICD-10-CM | POA: Diagnosis not present

## 2023-11-30 DIAGNOSIS — A419 Sepsis, unspecified organism: Secondary | ICD-10-CM | POA: Diagnosis not present

## 2023-11-30 DIAGNOSIS — L03116 Cellulitis of left lower limb: Secondary | ICD-10-CM | POA: Diagnosis not present

## 2023-11-30 DIAGNOSIS — M869 Osteomyelitis, unspecified: Secondary | ICD-10-CM | POA: Diagnosis not present

## 2023-11-30 DIAGNOSIS — E1122 Type 2 diabetes mellitus with diabetic chronic kidney disease: Secondary | ICD-10-CM | POA: Diagnosis not present

## 2023-11-30 DIAGNOSIS — I6523 Occlusion and stenosis of bilateral carotid arteries: Secondary | ICD-10-CM | POA: Diagnosis not present

## 2023-11-30 DIAGNOSIS — I953 Hypotension of hemodialysis: Secondary | ICD-10-CM | POA: Diagnosis not present

## 2023-11-30 LAB — ECHOCARDIOGRAM COMPLETE
Area-P 1/2: 2.66 cm2
Height: 60 in
S' Lateral: 2.5 cm
Weight: 2472.68 [oz_av]

## 2023-11-30 LAB — CBC WITH DIFFERENTIAL/PLATELET
Abs Immature Granulocytes: 0 10*3/uL (ref 0.00–0.07)
Basophils Absolute: 0 10*3/uL (ref 0.0–0.1)
Basophils Relative: 0 %
Eosinophils Absolute: 0.4 10*3/uL (ref 0.0–0.5)
Eosinophils Relative: 3 %
HCT: 39.8 % (ref 36.0–46.0)
Hemoglobin: 12.6 g/dL (ref 12.0–15.0)
Lymphocytes Relative: 14 %
Lymphs Abs: 1.8 10*3/uL (ref 0.7–4.0)
MCH: 24.1 pg — ABNORMAL LOW (ref 26.0–34.0)
MCHC: 31.7 g/dL (ref 30.0–36.0)
MCV: 76.2 fL — ABNORMAL LOW (ref 80.0–100.0)
Monocytes Absolute: 0.5 10*3/uL (ref 0.1–1.0)
Monocytes Relative: 4 %
Neutro Abs: 10.2 10*3/uL — ABNORMAL HIGH (ref 1.7–7.7)
Neutrophils Relative %: 79 %
Platelets: 347 10*3/uL (ref 150–400)
RBC: 5.22 MIL/uL — ABNORMAL HIGH (ref 3.87–5.11)
RDW: 29.2 % — ABNORMAL HIGH (ref 11.5–15.5)
WBC: 12.9 10*3/uL — ABNORMAL HIGH (ref 4.0–10.5)
nRBC: 0 % (ref 0.0–0.2)
nRBC: 0 /100{WBCs}

## 2023-11-30 LAB — GLUCOSE, CAPILLARY
Glucose-Capillary: 103 mg/dL — ABNORMAL HIGH (ref 70–99)
Glucose-Capillary: 76 mg/dL (ref 70–99)
Glucose-Capillary: 112 mg/dL — ABNORMAL HIGH (ref 70–99)
Glucose-Capillary: 114 mg/dL — ABNORMAL HIGH (ref 70–99)

## 2023-11-30 LAB — SURGICAL PATHOLOGY

## 2023-11-30 MED ORDER — APIXABAN 2.5 MG PO TABS
5.0000 mg | ORAL_TABLET | Freq: Two times a day (BID) | ORAL | Status: DC
  Administered 2023-11-30 – 2023-12-06 (×13): 5 mg via ORAL
  Filled 2023-11-30 (×13): qty 2

## 2023-11-30 MED ORDER — CHLORHEXIDINE GLUCONATE CLOTH 2 % EX PADS
6.0000 | MEDICATED_PAD | Freq: Every day | CUTANEOUS | Status: DC
  Administered 2023-11-30 – 2023-12-02 (×2): 6 via TOPICAL

## 2023-11-30 MED ORDER — ATORVASTATIN CALCIUM 10 MG PO TABS
20.0000 mg | ORAL_TABLET | Freq: Every day | ORAL | Status: DC
  Administered 2023-11-30 – 2023-12-05 (×6): 20 mg via ORAL
  Filled 2023-11-30 (×7): qty 2

## 2023-11-30 NOTE — Progress Notes (Signed)
  Echocardiogram 2D Echocardiogram has been performed.  Farley Honer, RDCS 11/30/2023, 11:52 AM

## 2023-11-30 NOTE — Progress Notes (Signed)
 STROKE TEAM PROGRESS NOTE   SUBJECTIVE (INTERVAL HISTORY) RN is at the bedside.  Patient seems more awake alert today and less lethargic. Neuro unchanged, CTA showed b/l ICA siphon severe stenosis, supporting watershed infarcts.    OBJECTIVE Temp:  [97.6 F (36.4 C)-98 F (36.7 C)] 97.7 F (36.5 C) (05/28 1318) Pulse Rate:  [55-66] 66 (05/28 1318) Cardiac Rhythm: Normal sinus rhythm (05/28 0816) Resp:  [17-19] 17 (05/28 1318) BP: (126-187)/(59-80) 187/80 (05/28 1318) SpO2:  [99 %-100 %] 100 % (05/28 1318) Weight:  [70.1 kg] 70.1 kg (05/28 0500)  Recent Labs  Lab 11/29/23 0625 11/29/23 1515 11/29/23 2111 11/30/23 0629 11/30/23 1316  GLUCAP 103* 95 108* 103* 76   Recent Labs  Lab 11/24/23 0624 11/25/23 0759 11/27/23 1105  NA 133* 134* 132*  K 4.1 3.9 4.4  CL 97* 96* 95*  CO2 24 24 22   GLUCOSE 208* 180* 80  BUN 20 13 26*  CREATININE 3.10* 2.23* 3.88*  CALCIUM  7.5* 8.0* 7.4*  PHOS  --  2.3* 3.5   Recent Labs  Lab 11/24/23 0624 11/25/23 0759 11/27/23 1105  AST 15  --   --   ALT 9  --   --   ALKPHOS 92  --   --   BILITOT 0.7  --   --   PROT 6.3*  --   --   ALBUMIN  2.0* 2.1* 2.0*   Recent Labs  Lab 11/24/23 0624 11/25/23 0759 11/27/23 1105 11/28/23 1224 11/29/23 0520 11/30/23 1038  WBC 20.0* 19.3* 15.0* 17.4* 14.0* 12.9*  NEUTROABS 15.4* 15.6*  --  14.8* 10.7* 10.2*  HGB 7.8* 8.4* 6.0* 11.8* 12.6 12.6  HCT 24.9* 27.3* 20.2* 35.1* 38.9 39.8  MCV 64.7* 65.6* 65.4* 74.1* 75.5* 76.2*  PLT 356 322 356 320 347 347   No results for input(s): "CKTOTAL", "CKMB", "CKMBINDEX", "TROPONINI" in the last 168 hours. No results for input(s): "LABPROT", "INR" in the last 72 hours. No results for input(s): "COLORURINE", "LABSPEC", "PHURINE", "GLUCOSEU", "HGBUR", "BILIRUBINUR", "KETONESUR", "PROTEINUR", "UROBILINOGEN", "NITRITE", "LEUKOCYTESUR" in the last 72 hours.  Invalid input(s): "APPERANCEUR"     Component Value Date/Time   CHOL 132 11/29/2023 0520   TRIG 90  11/29/2023 0520   HDL 21 (L) 11/29/2023 0520   CHOLHDL 6.3 11/29/2023 0520   VLDL 18 11/29/2023 0520   LDLCALC 93 11/29/2023 0520   Lab Results  Component Value Date   HGBA1C 7.1 (H) 11/22/2023      Component Value Date/Time   LABOPIA NONE DETECTED 12/01/2008 1935   COCAINSCRNUR NONE DETECTED 12/01/2008 1935   COCAINSCRNUR NEG 09/25/2007 2039   LABBENZ NONE DETECTED 12/01/2008 1935   LABBENZ NEG 09/25/2007 2039   AMPHETMU NONE DETECTED 12/01/2008 1935   THCU NONE DETECTED 12/01/2008 1935   LABBARB  12/01/2008 1935    NONE DETECTED        DRUG SCREEN FOR MEDICAL PURPOSES ONLY.  IF CONFIRMATION IS NEEDED FOR ANY PURPOSE, NOTIFY LAB WITHIN 5 DAYS.        LOWEST DETECTABLE LIMITS FOR URINE DRUG SCREEN Drug Class       Cutoff (ng/mL) Amphetamine      1000 Barbiturate      200 Benzodiazepine   200 Tricyclics       300 Opiates          300 Cocaine          300 THC              50    No  results for input(s): "ETH" in the last 168 hours.  I have personally reviewed the radiological images below and agree with the radiology interpretations.  ECHOCARDIOGRAM COMPLETE Result Date: 11/30/2023    ECHOCARDIOGRAM REPORT   Patient Name:   [name] [name] Date of Exam: 11/30/2023 Medical Rec #:  [id number]     Height:       60.0 in Accession #:    [id number]    Weight:       154.5 lb Date of Birth:  1965/11/06     BSA:          1.673 m Patient Age:    58 years      BP:           139/59 mmHg Patient Gender: F             HR:           60 bpm. Exam Location:  Inpatient Procedure: 2D Echo, Cardiac Doppler and Color Doppler (Both Spectral and Color            Flow Doppler were utilized during procedure). Indications:    Stroke I63.9  History:        Patient has prior history of Echocardiogram examinations, most                 recent 05/29/2020. Risk Factors:Hypertension and Diabetes.  Sonographer:    Kip Peon RDCS Referring Phys: [id number] RAVI PAHWANI  Sonographer Comments: Image acquisition  challenging due to uncooperative patient. IMPRESSIONS  1. Mild intracavitary gradient. Left ventricular ejection fraction, by estimation, is 60 to 65%. The left ventricle has normal function. The left ventricle has no regional wall motion abnormalities. Left ventricular diastolic parameters are consistent with Grade I diastolic dysfunction (impaired relaxation).  2. Right ventricular systolic function is normal. The right ventricular size is normal. There is normal pulmonary artery systolic pressure.  3. The mitral valve is normal in structure. Trivial mitral valve regurgitation. No evidence of mitral stenosis. Moderate mitral annular calcification.  4. The aortic valve is tricuspid. There is mild calcification of the aortic valve. There is mild thickening of the aortic valve. Aortic valve regurgitation is not visualized. No aortic stenosis is present.  5. The inferior vena cava is normal in size with greater than 50% respiratory variability, suggesting right atrial pressure of 3 mmHg. FINDINGS  Left Ventricle: Mild intracavitary gradient. Left ventricular ejection fraction, by estimation, is 60 to 65%. The left ventricle has normal function. The left ventricle has no regional wall motion abnormalities. The left ventricular internal cavity size  was normal in size. There is no left ventricular hypertrophy. Left ventricular diastolic parameters are consistent with Grade I diastolic dysfunction (impaired relaxation). Indeterminate filling pressures. Right Ventricle: The right ventricular size is normal. No increase in right ventricular wall thickness. Right ventricular systolic function is normal. There is normal pulmonary artery systolic pressure. The tricuspid regurgitant velocity is 2.31 m/s, and  with an assumed right atrial pressure of 3 mmHg, the estimated right ventricular systolic pressure is 24.3 mmHg. Left Atrium: Left atrial size was normal in size. Right Atrium: Right atrial size was normal in size.  Pericardium: There is no evidence of pericardial effusion. Mitral Valve: The mitral valve is normal in structure. Moderate mitral annular calcification. Trivial mitral valve regurgitation. No evidence of mitral valve stenosis. Tricuspid Valve: The tricuspid valve is normal in structure. Tricuspid valve regurgitation is trivial. No evidence of tricuspid stenosis. Aortic Valve: The aortic valve is tricuspid.  There is mild calcification of the aortic valve. There is mild thickening of the aortic valve. Aortic valve regurgitation is not visualized. No aortic stenosis is present. Pulmonic Valve: The pulmonic valve was normal in structure. Pulmonic valve regurgitation is not visualized. No evidence of pulmonic stenosis. Aorta: The aortic root is normal in size and structure. Venous: The inferior vena cava is normal in size with greater than 50% respiratory variability, suggesting right atrial pressure of 3 mmHg. IAS/Shunts: No atrial level shunt detected by color flow Doppler.  LEFT VENTRICLE PLAX 2D LVIDd:         4.20 cm   Diastology LVIDs:         2.50 cm   LV e' medial:    5.55 cm/s LV PW:         1.00 cm   LV E/e' medial:  13.3 LV IVS:        1.00 cm   LV e' lateral:   9.79 cm/s LVOT diam:     1.70 cm   LV E/e' lateral: 7.5 LV SV:         68 LV SV Index:   41 LVOT Area:     2.27 cm  RIGHT VENTRICLE             IVC RV Basal diam:  3.10 cm     IVC diam: 1.40 cm RV S prime:     14.90 cm/s TAPSE (M-mode): 2.2 cm LEFT ATRIUM             Index        RIGHT ATRIUM           Index LA diam:        2.70 cm 1.61 cm/m   RA Area:     12.00 cm LA Vol (A2C):   19.6 ml 11.72 ml/m  RA Volume:   25.80 ml  15.42 ml/m LA Vol (A4C):   25.3 ml 15.12 ml/m LA Biplane Vol: 24.2 ml 14.47 ml/m  AORTIC VALVE LVOT Vmax:   110.00 cm/s LVOT Vmean:  78.600 cm/s LVOT VTI:    0.300 m  AORTA Ao Root diam: 2.40 cm Ao Asc diam:  2.80 cm MITRAL VALVE               TRICUSPID VALVE MV Area (PHT): 2.66 cm    TR Peak grad:   21.3 mmHg MV Decel Time:  285 msec    TR Vmax:        231.00 cm/s MV E velocity: 73.90 cm/s MV A velocity: 98.50 cm/s  SHUNTS MV E/A ratio:  0.75        Systemic VTI:  0.30 m                            Systemic Diam: 1.70 cm Maudine Sos MD Electronically signed by Maudine Sos MD Signature Date/Time: 11/30/2023/2:42:32 PM    Final    CT ANGIO HEAD NECK W WO CM Result Date: 11/29/2023 CLINICAL DATA:  Stroke follow-up EXAM: CT ANGIOGRAPHY HEAD AND NECK WITH AND WITHOUT CONTRAST TECHNIQUE: Multidetector CT imaging of the head and neck was performed using the standard protocol during bolus administration of intravenous contrast. Multiplanar CT image reconstructions and MIPs were obtained to evaluate the vascular anatomy. Carotid stenosis measurements (when applicable) are obtained utilizing NASCET criteria, using the distal internal carotid diameter as the denominator. RADIATION DOSE REDUCTION: This exam was performed according to the departmental dose-optimization  program which includes automated exposure control, adjustment of the mA and/or kV according to patient size and/or use of iterative reconstruction technique. CONTRAST:  75mL OMNIPAQUE  IOHEXOL  350 MG/ML SOLN COMPARISON:  11/28/2023 brain MRI FINDINGS: CT HEAD FINDINGS Brain: There is no mass, hemorrhage or extra-axial collection. The size and configuration of the ventricles and extra-axial CSF spaces are normal. There is hypoattenuation of the white matter, most commonly indicating chronic small vessel disease. Vascular: No hyperdense vessel or unexpected vascular calcification. Skull: The visualized skull base, calvarium and extracranial soft tissues are normal. Sinuses/Orbits: No fluid levels or advanced mucosal thickening of the visualized paranasal sinuses. No mastoid or middle ear effusion. Normal orbits. CTA NECK FINDINGS Skeleton: No acute abnormality or high grade bony spinal canal stenosis. Other neck: Normal pharynx, larynx and major salivary glands. No cervical  lymphadenopathy. Unremarkable thyroid  gland. Upper chest: No pneumothorax or pleural effusion. No nodules or masses. Aortic arch: There is calcific atherosclerosis of the aortic arch. Conventional 3 vessel aortic branching pattern. RIGHT carotid system: No dissection, occlusion or aneurysm. Mild atherosclerotic calcification at the carotid bifurcation without hemodynamically significant stenosis. LEFT carotid system: No dissection, occlusion or aneurysm. There is mixed density atherosclerosis extending into the proximal ICA, resulting in less than 50% stenosis. Vertebral arteries: Codominant configuration. There is no dissection, occlusion or flow-limiting stenosis to the skull base (V1-V3 segments). CTA HEAD FINDINGS POSTERIOR CIRCULATION: Vertebral arteries are normal. No proximal occlusion of the anterior or inferior cerebellar arteries. Basilar artery is normal. Superior cerebellar arteries are normal. Posterior cerebral arteries are normal. ANTERIOR CIRCULATION: Bilateral intracranial ICA atherosclerosis with moderate stenosis of the supraclinoid left ICA. Anterior cerebral arteries are normal. Middle cerebral arteries are normal. Venous sinuses: As permitted by contrast timing, patent. Anatomic variants: The right A1 segment of the ACA is absent. Both posterior communicating arteries are patent. Review of the MIP images confirms the above findings. IMPRESSION: 1. No emergent large vessel occlusion. 2. 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 3. Bilateral carotid bifurcation atherosclerosis without hemodynamically significant stenosis. Aortic Atherosclerosis (ICD10-I70.0). Electronically Signed   By: Juanetta Nordmann M.D.   On: 11/29/2023 20:13   MR BRAIN WO CONTRAST Result Date: 11/28/2023 EXAM: MRI BRAIN WITHOUT CONTRAST 11/28/2023 11:14:22 AM TECHNIQUE: Multiplanar multisequence MRI of the head/brain was performed without the administration of intravenous contrast. COMPARISON: MR head without and with  contrast 04/05/2022. CLINICAL HISTORY: Neuro deficit, acute, stroke suspected. FINDINGS: BRAIN AND VENTRICLES: Multiple bilateral punctate nonhemorrhagic infarcts are present. A punctate cortical infarct was present in the right middle frontal gyrus on image 42 of series 2. A new punctate white matter infarct is present in the posterior left centrum semiovale on image 37. A punctate white matter infarct is present in the posterior right corona radiata on image 35 with a second lesion more anterior on image 31. A subcortical punctate white matter infarct is present in the posterior right temporal lobe on image 23. A medial inferior posterior left cerebellar lesion is present on image 13. Periventricular and subcortical white matter T2 hyperintensities and moderate generalized atrophy demonstrate slight progression compared to the prior exam. White matter changes extend into the brainstem. Punctate foci of susceptibility again noted in the cerebellum bilaterally. No focal abnormality in the posterior right hippocampus, stable. ORBITS: Bilateral lens replacements are noted. The globes and orbits are otherwise within normal limits. SINUSES AND MASTOIDS: Fluid levels are present in the sphenoid sinuses, right greater than left. The paranasal sinuses and mastoid air cells are otherwise clear. BONES  AND SOFT TISSUES: Normal marrow signal. No acute soft tissue abnormality. IMPRESSION: 1. Multiple acute bilateral punctate nonhemorrhagic infarcts in multiple vascular distributions as described. 2. Slight progression of periventricular and subcortical white matter T2 hyperintensities and moderate generalized atrophy compared to the prior exam. White matter changes extend into the brainstem. Findings are consistent with progressive chronic microvascular disease. 3. Fluid levels in the sphenoid sinuses, right greater than left suggesting acute sinusitis . Electronically signed by: Audree Leas MD 11/28/2023 12:19 PM EDT  RP Workstation: [id number]   MR HEEL LEFT WO CONTRAST Result Date: 11/23/2023 CLINICAL DATA:  Diabetic foot swelling, tissue necrosis EXAM: MR OF THE LEFT HEEL WITHOUT CONTRAST TECHNIQUE: Multiplanar, multisequence MR imaging of the left heel was performed. No intravenous contrast was administered. COMPARISON:  Radiographs 11/21/2023 FINDINGS: Bones/Joint/Cartilage Abnormal edema posteriorly and medially in the calcaneus compatible with osteomyelitis given the overlying ulceration and abnormal gas in the soft tissues. Marrow edema in the talar neck and adjacent talar body potentially from stress fracture or osteomyelitis. Mild degenerative subcortical findings at the calcaneocuboid articulation. Ligaments Attenuated anterior talofibular ligament, query prior tear. Muscles and Tendons Abnormal edema throughout the abductor hallucis muscle with some tenderness laxity along the myotendinous junction suspicious for tear. Regional muscular atrophy and low-grade edema probably neurogenic. Soft tissues Prominent plantar-lateral heel ulceration with gas tracking in the soft tissues and fluid infiltration as well extending below the calcaneus and superficial to the abductor digiti the minimi muscle in the subcutaneous tissues. Appearance compatible with necrotic tissue and gas-forming soft tissue infection. IMPRESSION: 1. Prominent plantar-lateral heel ulceration with gas tracking in the soft tissues and fluid infiltration as well extending below the calcaneus and superficial to the abductor digiti minimi muscle in the subcutaneous tissues. Appearance compatible with necrotic tissue and gas-forming soft tissue infection. 2. Abnormal edema posteriorly and medially in the calcaneus compatible with osteomyelitis. 3. Marrow edema in the talar neck and adjacent talar body potentially from stress fracture or osteomyelitis. 4. Abnormal edema throughout the abductor hallucis muscle with some laxity along the myotendinous junction  suspicious for tear. Regional muscular atrophy and low-grade edema probably neurogenic. 5. Attenuated anterior talofibular ligament, query prior tear. Electronically Signed   By: Freida Jes M.D.   On: 11/23/2023 10:30   MR FOOT LEFT WO CONTRAST Result Date: 11/23/2023 CLINICAL DATA:  Chronic heel/foot pain. Gas in the plantar soft tissues on radiographs 11/21/2023 EXAM: MRI OF THE LEFT FOOT WITHOUT CONTRAST TECHNIQUE: Multiplanar, multisequence MR imaging of the left forefoot was performed. No intravenous contrast was administered. COMPARISON:  11/21/2023 radiographs FINDINGS: Bones/Joint/Cartilage Arthropathy of the proximal interphalangeal joint of the second toe with potential resorption of the head of the proximal phalanx, hyperflexion of the PIP joint, and edema signal especially in the distal half of the proximal phalanx and also on the base of the middle phalanx. Strictly speaking osteomyelitis is not excluded. No obvious joint effusion. Stress fracture of the proximal metaphysis of the first metatarsal with associated low-grade edema throughout the shaft of the first metatarsal. The stress fractures primarily visible laterally as on image 15 series 12. Marrow edema distally in the lateral cuneiform as on image 16 series 12, compatible with stress injury. Partially visualized findings in the hindfoot are reserved for the ankle MRI dictation. Ligaments Lisfranc ligament intact. Muscles and Tendons Abnormal muscular edema in the abductor hallucis muscle on 9 series 11 with some laxity along the myotendinous junction suspicious for muscle tear. Low-level edema within along remaining musculature of the forefoot  likely neurogenic. Atrophic extensor digitorum brevis muscle. Soft tissues Loculations of gas an infiltrative fluid track along the plantar-lateral margin of the abductor digiti minimi muscle. Subcutaneous edema tracks along the plantar foot and to a lesser extent along the dorsal foot.  IMPRESSION: 1. Loculations of gas and infiltrative fluid track along the plantar-lateral margin of the abductor digiti minimi muscle. This is compatible with soft tissue infection and gas-forming organism. 2. Abnormal muscular edema in the abductor hallucis muscle with some laxity along the myotendinous junction suspicious for muscle tear. 3. Stress fracture of the proximal metaphysis of the first metatarsal with associated low-grade edema throughout the shaft of the first metatarsal. 4. Marrow edema distally in the lateral cuneiform, compatible with stress injury. 5. Arthropathy of the proximal interphalangeal joint of the second toe with potential resorption of the head of the proximal phalanx, hyperflexion of the PIP joint, and edema signal especially in the distal half of the proximal phalanx and also on the base of the middle phalanx. Strictly speaking osteomyelitis is not excluded. 6. Subcutaneous edema tracks along the plantar foot and to a lesser extent along the dorsal foot. 7. Partially visualized findings in the hindfoot are reserved for the ankle MRI dictation. Electronically Signed   By: Freida Jes M.D.   On: 11/23/2023 10:25   DG Foot Complete Left Result Date: 11/21/2023 CLINICAL DATA:  Infection.  Left foot gangrene. EXAM: LEFT FOOT - COMPLETE 3+ VIEW COMPARISON:  None Available. FINDINGS: The bones are diffusely under mineralized. Hammertoe deformity of the digits further limits assessment. Patchy subcutaneous gas in the hindfoot with the probable ulcer posterior to the calcaneus. Soft tissue gas is seen laterally the hindfoot and midfoot. There is no obvious bony destructive change, however the degree of bony under mineralization limits assessment. Soft tissue ulcer may extend to bone involving the posterior calcaneus. No evidence of acute fracture. Generalized soft tissue edema with prominent vascular calcifications. Punctate densities in the plantar soft tissues may be calcifications  or tiny radiopaque foreign bodies. IMPRESSION: 1. Probable soft tissue ulcer posterior to the calcaneus. Patchy soft tissue gas is seen in the plantar and lateral hindfoot and midfoot. 2. No obvious bony destructive change, however the degree of bony under mineralization limits assessment. Soft tissue ulcer may extend to bone involving the posterior calcaneus. 3. Generalized soft tissue edema with prominent vascular calcifications. Punctate densities in the plantar soft tissues may be calcifications or tiny radiopaque foreign bodies. Electronically Signed   By: Chadwick Colonel M.D.   On: 11/21/2023 17:10     PHYSICAL EXAM  Temp:  [97.6 F (36.4 C)-98 F (36.7 C)] 97.7 F (36.5 C) (05/28 1318) Pulse Rate:  [55-66] 66 (05/28 1318) Resp:  [17-19] 17 (05/28 1318) BP: (126-187)/(59-80) 187/80 (05/28 1318) SpO2:  [99 %-100 %] 100 % (05/28 1318) Weight:  [70.1 kg] 70.1 kg (05/28 0500)  General - Well nourished, well developed, in no apparent distress, lethargic.  Ophthalmologic - fundi not visualized due to noncooperation.  Cardiovascular - Regular rhythm and rate.  Neuro - awake, alert, eyes open, orientated to age, place, time. No aphasia but paucity of speech, following all simple commands. Able to name 3/3 and repeat simple sentences.  Limited bilateral eye movement, left eye blind with only some light perception, right eye tunnel vision.  Slight right facial droop. Tongue midline. Bilateral UEs 3/5, no drift. RLE on foot boot, 2-/5 and LLE PKA with wound VAC 1/5. Sensation symmetrical bilaterally, b/l FTN intact but slow, gait  not tested.     ASSESSMENT/PLAN [name] is a 58 y.o. female with history of diabetes, hypertension, anemia, ESRD on hemodialysis, left eye blind, left back pain admitted for left heel cellulitis.  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  Still very lethargic, generalized weakness postop, MRI showed multiple bilateral small infarcts.  No TNK given due to altered  window.    Stroke:  bilateral punctate/small watershed infarcts, etiology likely large vessel source with bilateral ICA siphon high-grade stenosis in the setting of sepsis versus hypotension during dialysis MRI  Multiple acute bilateral punctate nonhemorrhagic infarcts in multiple vascular distributions, more consistent with watershed pattern CT head and neck Moderate stenosis of the supraclinoid left ICA. On my read, also moderate stenosis of the supraclinoid right ICA. 2D Echo EF 60-65% LDL 93 HgbA1c 7.1 SCDs for VTE prophylaxis Eliquis (apixaban) daily prior to admission, now back on eliquis given normalized Hb Patient counseled to be compliant with her antithrombotic medications Ongoing aggressive stroke risk factor management Therapy recommendations:  SNF Disposition: Pending  DVT In 02/2023, LE venous Doppler showed Deep venous thrombus within the right profunda and superficial femoral veins.  Repeat in 06/2023 showed positive residual nonocclusive DVT in the right superficial femoral vein with improvement On Eliquis PTA Currently Eliquis on hold due to severe anemia needing blood transfusion Resume Eliquis today as hemoglobin stable  Left lower extremity infection Admitted with left heel wound and cellulitis, suspicion for possible gas-forming infection Leukocytosis WBC 20.0--19.3--15.0--17.4--12.9 Status post left BKA 5/23 Now with wound VAC, wound dry clean  Diabetes HgbA1c 7.1 goal < 7.0 Uncontrolled with hypoglycemic episode CBG monitoring SSI DM education and close PCP follow up  Hypertension Stable on high and Currently on Norvasc  5, clonidine  0.1 patch and metoprolol 50 twice daily Avoid low BP given bilateral ICA siphon high-grade stenosis Recommend gentle hemodialysis during session Long term BP goal 130-150  Hyperlipidemia Home meds: None LDL 93, goal < 70 On lipitor 20 No High intensity statin show given not far from goal Continue statin at  discharge  Dysphagia Passed swallow  On dys3 and thin liquid  Other Stroke Risk Factors ESRD on dialysis  Other Active Problems Severe anemia, hemoglobin 6.0 status post PRBC --11.8--12.6--12.6 Leukocytosis  Hospital day # 9  Neurology will sign off. Please call with questions. Pt will follow up with stroke clinic NP at [facility] in about 4 weeks. Thanks for the consult.   Consuelo Denmark, MD PhD Stroke Neurology 11/30/2023 4:05 PM    To contact Stroke Continuity provider, please refer to [email or web address]. After hours, contact General Neurology

## 2023-11-30 NOTE — Progress Notes (Addendum)
 OT Cancellation Note  Patient Details Name: [name] [name] MRN: [id number] DOB: [birth year]   Cancelled Treatment:    Reason Eval/Treat Not Completed: Patient at procedure or test/ unavailable Patient at [facility], but per RN stating, "I want to get up today." OT will follow back as time permits.   OT re-attempting at 1400. Patient does not recall stating she wants to get up, and simply stated, "Can we try a later time". Patient has not participated in therapy since evaluation, and per evaluation receives assist for all for ADLs and is a hoyer for OOB mobility. Patient can self feed, and does not appear to be far off her baseline. OT will sign off at this time, and recommend return to facility when medically stable.   Mollie Anger E. Muranda Coye, OTR/L Acute Rehabilitation Services [phone]   Vincent Greek 11/30/2023, 11:17 AM

## 2023-11-30 NOTE — Progress Notes (Signed)
 PROGRESS NOTE    [name] [name]  BJY:[id number] DOB: 1966/03/08 DOA: 11/21/2023 PCP: Alwyn Juba., MD   Brief Narrative:  The patient 58 year old African-American female with a past medical history significant for ESRD on hemodialysis Tuesday Thursday Saturdays, GERD, essential hypertension, left eye blindness, diabetes mellitus type 2 which is fairly well-controlled, history of lumbar radiculopathy presented from her SNF from [facility] with a left heel wound and cellulitis and suspicion for possible and gas-forming infection. She has been undergoing wound care treatments but states that the foot has gotten worse. Workup in the ED shows a wound infection and cellulitis but there is concern for deeper infection so we will obtain an MRI of the heel and foot and obtain vascular ABIs. On presentation she was found to have a hemoglobin of 6.1 with no obvious source of bleeding so she was transfused 1 unit of PRBC.  Nephrology and orthopedics consulted.  Hospitalization complicated with multifocal stroke and catatonia, neurology and psychiatry consulted.  Assessment & Plan:   Principal Problem:   Cellulitis of left lower extremity Active Problems:   Diabetic neuropathy (HCC)   Essential hypertension   GERD (gastroesophageal reflux disease)   ESRD (end stage renal disease) (HCC)   DM2 (diabetes mellitus, type 2) (HCC)   Anemia due to chronic kidney disease   Osteomyelitis of left foot (HCC)  Infected Diabetic Foot ulcer with Cellulitis/gangrene and gas-forming infection of the left heel/osteomyelitis: The unstageable pressure heel left ulcer with a wound bed black eschar.  States that she sees the wound clinic but is not getting better.  she received Unasyn  and linezolid  in the ED and was later started on cefepime  and vancomycin .  Blood cultures x 2 were obtained and NGTD.  MRI confirms osteomyelitis.  Ortho consulted, patient seen by Dr. 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  Has wound VAC attached, suction is good per Dr. Julio Ohm.  Antibiotic discontinued on 11/28/2023.  Multifocal stroke: Postsurgery, patient had intermittent somnolence and not having proper conversations.  Initially it was being suspected that patient was severely depressed however it was noted on 11/28/2023 that patient appeared to be making efforts to talk but she just could not and could not follow commands or left extremities and thus stroke was suspected and stat MRI was ordered which confirmed multifocal ischemic stroke.  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  Bilateral carotid bifurcation atherosclerosis without significant stenosis.  Neurology recommends resuming anticoagulation once hemoglobin stable.  Pending CBC today, if stable, may consider resume Eliquis and monitor closely.  Lipid panel within normal range.  Defer to neurology for further management.  Appreciate their help.  ?  Depression/catatonia: Due to patient's unwillingness or inability to talk and refusal of labs and medical care, psychiatry was consulted on the same day of 11/28/2023, they suspected catatonia, patient was given Ativan and per psychiatry note, she improved making a diagnosis of catatonia.  She is still having intermittent symptoms.  She is on as needed Ativan for catatonia symptoms.  Psychiatry following.  Appreciate their help.   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  Intermittent hypoglycemia in the setting of poor p.o. intake.  C/w Sensitive Novolog  SSI AC/HS. Has been refusing her Insulin  regimen.    HypoNa+: Mild, asymptomatic, improving.   Essential  HTN: Blood pressure fairly controlled.  C/w Amlodipine  5 mg po Daily, clonidine  patch, and Metoprolol  Tartrate 50 mg po BID.    ESRD on HD  TTSat: Nephrology following.  Appreciate their help with management of this.   Symptomatic Anemia/Microcytic Anemia/Anemia of Chronic Kidney Disease: Hemoglobin 6.1 upon arrival, status post 1 unit of PRBC transfusion, hemoglobin improved to 8.4 but dropped to 6.0 on 11/27/2023 for which she received 2 units of PRBC transfusion in dialysis unit.  Patient continued to refuse blood draws and finally we were able to get labs on her on the afternoon of 11/28/2023 and hemoglobin is 11.8 followed by 12.6.  Repeat CBC pending today.  Thrombocytosis: Improved.  GOC: Palliative care following.  Now that the cause of patient's inability to talk is diagnosed/catatonia, I hope she will be able to have better conversation with palliative care.  However per psychiatry note, patient was very clear in her intention to remain full code.  Apparently palliative care talked to the husband as well at he did not want to make any decision for her and deferred to her for sensitive decisions.  Last note from palliative care on 11/28/2023.  DVT prophylaxis: SCD's Start: 11/26/23 0743 SCDs Start: 11/21/23 2128   Code Status: Full Code  Family Communication:  None present at bedside.  I have discussed with the husband twice in last 1 week.  I called him yesterday as well as today, both days, the phone kept ringing but no one picked up.  Both times when I spoke to the husband, he did not appear to be interested much in knowing about the patient and does not ask any questions either.  Reportedly, he has not come to see his wife either and I have not received any message from the nurses about his return call either.    Status is: Inpatient Remains inpatient appropriate because: Status post BKA, anemia, stroke, catatonia.  Patient very sick.  Estimated body mass index is 30.18 kg/m as calculated from the following:   Height as of this encounter: 5' (1.524 m).   Weight as of this encounter: 70.1 kg.  Pressure Injury 11/28/23  Sacrum Stage 3 -  Full thickness tissue loss. Subcutaneous fat may be visible but bone, tendon or muscle are NOT exposed. (Active)  11/28/23 0931  Location: Sacrum  Location Orientation:   Staging: Stage 3 -  Full thickness tissue loss. Subcutaneous fat may be visible but bone, tendon or muscle are NOT exposed.  Wound Description (Comments):   Present on Admission:   Dressing Type Foam - Lift dressing to assess site every shift 11/30/23 0400   Nutritional Assessment: Body mass index is 30.18 kg/m.Aaron Aas Seen by dietician.  I agree with the assessment and plan as outlined below: Nutrition Status:        . Skin Assessment: I have examined the patient's skin and I agree with the wound assessment as performed by the wound care RN as outlined below: Pressure Injury 11/28/23 Sacrum Stage 3 -  Full thickness tissue loss. Subcutaneous fat may be visible but bone, tendon or muscle are NOT exposed. (Active)  11/28/23 0931  Location: Sacrum  Location Orientation:   Staging: Stage 3 -  Full thickness tissue loss. Subcutaneous fat may be visible but bone, tendon or muscle are NOT exposed.  Wound Description (Comments):   Present on Admission:   Dressing Type Foam - Lift dressing to assess site every shift 11/30/23 0400    Consultants:  Orthopedics  Procedures:  BKA Antimicrobials:  Anti-infectives (From admission, onward)    Start     Dose/Rate Route Frequency Ordered Stop   11/29/23 1200  vancomycin  (VANCOREADY) IVPB 750 mg/150 mL  Status:  Discontinued        750 mg 150 mL/hr over 60 Minutes Intravenous Every T-Th-Sa (Hemodialysis) 11/27/23 1150 11/28/23 0902   11/27/23 1400  vancomycin  (VANCOREADY) IVPB 750 mg/150 mL        750 mg 150 mL/hr over 60 Minutes Intravenous  Once 11/27/23 1150 11/27/23 1757   11/26/23 1708  vancomycin  variable dose per unstable renal function (pharmacist dosing)  Status:  Discontinued         Does not apply See admin instructions 11/26/23 1710 11/27/23 1151    11/26/23 1200  vancomycin  (VANCOREADY) IVPB 750 mg/150 mL  Status:  Discontinued        750 mg 150 mL/hr over 60 Minutes Intravenous Every T-Th-Sa (Hemodialysis) 11/25/23 1015 11/26/23 1710   11/25/23 1109  ceFAZolin  (ANCEF ) 2-4 GM/100ML-% IVPB       Note to Pharmacy: [name], [name]: cabinet override      11/25/23 1109 11/25/23 1303   11/25/23 0600  ceFAZolin  (ANCEF ) IVPB 2g/100 mL premix        2 g 200 mL/hr over 30 Minutes Intravenous On call to O.R. 11/25/23 0214 11/25/23 1329   11/24/23 1200  vancomycin  (VANCOREADY) IVPB 750 mg/150 mL        750 mg 150 mL/hr over 60 Minutes Intravenous Every T-Th-Sa (Hemodialysis) 11/22/23 1437 11/24/23 1253   11/23/23 1330  vancomycin  (VANCOREADY) IVPB 750 mg/150 mL        750 mg 150 mL/hr over 60 Minutes Intravenous  Once 11/23/23 1243 11/23/23 1529   11/22/23 0600  Vancomycin  (VANCOCIN ) 1,250 mg in sodium chloride  0.9 % 250 mL IVPB        1,250 mg 166.7 mL/hr over 90 Minutes Intravenous  Once 11/21/23 2122 11/22/23 0714   11/22/23 0200  piperacillin -tazobactam (ZOSYN ) IVPB 2.25 g  Status:  Discontinued        2.25 g 100 mL/hr over 30 Minutes Intravenous Every 8 hours 11/21/23 2122 11/21/23 2126   11/21/23 2230  ceFEPIme  (MAXIPIME ) 1 g in sodium chloride  0.9 % 100 mL IVPB  Status:  Discontinued        1 g 200 mL/hr over 30 Minutes Intravenous Every 24 hours 11/21/23 2126 11/28/23 0902   11/21/23 2120  vancomycin  variable dose per unstable renal function (pharmacist dosing)  Status:  Discontinued         Does not apply See admin instructions 11/21/23 2122 11/25/23 1015   11/21/23 1600  Ampicillin -Sulbactam (UNASYN ) 3 g in sodium chloride  0.9 % 100 mL IVPB       Placed in "And" Linked Group   3 g 200 mL/hr over 30 Minutes Intravenous  Once 11/21/23 1547 11/21/23 1740   11/21/23 1600  linezolid  (ZYVOX ) IVPB 600 mg       Placed in "And" Linked Group   600 mg 300 mL/hr over 60 Minutes Intravenous  Once 11/21/23 1547 11/21/23 1808          Subjective: Patient seen and examined.  Appears to be slightly more lethargic today.  Will not open her eyes.  Responds to verbal questions only if asked second times.  Today I think she is deliberately ignoring.  I kept asking questions and she responds to saying " what, let me sleep"  Objective: Vitals:   11/30/23 0028 11/30/23 0410 11/30/23 0500  11/30/23 0849  BP: (!) 126/59 (!) 139/59  (!) 155/63  Pulse: (!) 55 (!) 58  60  Resp: 18 18  18   Temp: 98 F (36.7 C) 98 F (36.7 C)  97.6 F (36.4 C)  TempSrc:    Oral  SpO2: 99% 100%  99%  Weight:   70.1 kg   Height:        Intake/Output Summary (Last 24 hours) at 11/30/2023 0958 Last data filed at 11/29/2023 2105 Gross per 24 hour  Intake 0 ml  Output 1400 ml  Net -1400 ml    Filed Weights   11/29/23 1232 11/29/23 1433 11/30/23 0500  Weight: 63.2 kg 62 kg 70.1 kg    Examination:  General exam: Appears lethargic but comfortable. Respiratory system: Clear to auscultation. Respiratory effort normal. Cardiovascular system: S1 & S2 heard, RRR. No JVD, murmurs, rubs, gallops or clicks. No pedal edema. Gastrointestinal system: Abdomen is nondistended, soft and nontender. No organomegaly or masses felt. Normal bowel sounds heard. Central nervous system: Lethargic, not willing to follow commands. Extremities: Symmetric 5 x 5 power. Skin: No rashes, lesions or ulcers.     Data Reviewed: I have personally reviewed following labs and imaging studies  CBC: Recent Labs  Lab 11/24/23 0624 11/25/23 0759 11/27/23 1105 11/28/23 1224 11/29/23 0520  WBC 20.0* 19.3* 15.0* 17.4* 14.0*  NEUTROABS 15.4* 15.6*  --  14.8* 10.7*  HGB 7.8* 8.4* 6.0* 11.8* 12.6  HCT 24.9* 27.3* 20.2* 35.1* 38.9  MCV 64.7* 65.6* 65.4* 74.1* 75.5*  PLT 356 322 356 320 347   Basic Metabolic Panel: Recent Labs  Lab 11/24/23 0624 11/25/23 0759 11/27/23 1105  NA 133* 134* 132*  K 4.1 3.9 4.4  CL 97* 96* 95*  CO2 24 24 22   GLUCOSE 208* 180* 80   BUN 20 13 26*  CREATININE 3.10* 2.23* 3.88*  CALCIUM  7.5* 8.0* 7.4*  PHOS  --  2.3* 3.5   GFR: Estimated Creatinine Clearance: 14 mL/min (A) (by C-G formula based on SCr of 3.88 mg/dL (H)). Liver Function Tests: Recent Labs  Lab 11/24/23 0624 11/25/23 0759 11/27/23 1105  AST 15  --   --   ALT 9  --   --   ALKPHOS 92  --   --   BILITOT 0.7  --   --   PROT 6.3*  --   --   ALBUMIN  2.0* 2.1* 2.0*   No results for input(s): "LIPASE", "AMYLASE" in the last 168 hours. Recent Labs  Lab 11/28/23 1846  AMMONIA 13   Coagulation Profile: No results for input(s): "INR", "PROTIME" in the last 168 hours. Cardiac Enzymes: No results for input(s): "CKTOTAL", "CKMB", "CKMBINDEX", "TROPONINI" in the last 168 hours. BNP (last 3 results) No results for input(s): "PROBNP" in the last 8760 hours. HbA1C: No results for input(s): "HGBA1C" in the last 72 hours.  CBG: Recent Labs  Lab 11/28/23 2201 11/29/23 0625 11/29/23 1515 11/29/23 2111 11/30/23 0629  GLUCAP 88 103* 95 108* 103*   Lipid Profile: Recent Labs    11/29/23 0520  CHOL 132  HDL 21*  LDLCALC 93  TRIG 90  CHOLHDL 6.3   Thyroid  Function Tests: Recent Labs    11/28/23 1846  TSH 4.350   Anemia Panel: Recent Labs    11/28/23 1846  VITAMINB12 800   Sepsis Labs: No results for input(s): "PROCALCITON", "LATICACIDVEN" in the last 168 hours.   Recent Results (from the past 240 hours)  Blood Cultures x 2 sites  Status: None   Collection Time: 11/21/23  4:41 PM   Specimen: BLOOD LEFT ARM  Result Value Ref Range Status   Specimen Description   Final    BLOOD LEFT ARM Performed at [facility], [address], [place], [place] [address]    Special Requests   Final    BOTTLES DRAWN AEROBIC AND ANAEROBIC Blood Culture results may not be optimal due to an inadequate volume of blood received in culture bottles Performed at [facility], [address], [place], [place] [address]     Culture   Final    NO GROWTH 5 DAYS Performed at [facility], [address], [place], [place] [address]    Report Status 11/26/2023 FINAL  Final  Culture, blood (Routine X 2) w Reflex to ID Panel     Status: None   Collection Time: 11/22/23 12:48 AM   Specimen: BLOOD  Result Value Ref Range Status   Specimen Description BLOOD RIGHT ANTECUBITAL  Final   Special Requests   Final    BOTTLES DRAWN AEROBIC AND ANAEROBIC Blood Culture adequate volume   Culture   Final    NO GROWTH 5 DAYS Performed at [facility], [address] [place], [place] [address]    Report Status 11/27/2023 FINAL  Final  Surgical pcr screen     Status: None   Collection Time: 11/25/23 11:31 AM   Specimen: Nasal Mucosa; Nasal Swab  Result Value Ref Range Status   MRSA, PCR NEGATIVE NEGATIVE Final   Staphylococcus aureus NEGATIVE NEGATIVE Final    Comment: (NOTE) The Xpert SA Assay (FDA approved for NASAL specimens in patients 87 years of age and older), is one component of a comprehensive surveillance program. It is not intended to diagnose infection nor to guide or monitor treatment. Performed at [facility], [address], [place], [place] [address]   Culture, blood (Routine X 2) w Reflex to ID Panel     Status: None (Preliminary result)   Collection Time: 11/28/23  7:18 PM   Specimen: BLOOD LEFT HAND  Result Value Ref Range Status   Specimen Description BLOOD LEFT HAND  Final   Special Requests   Final    BOTTLES DRAWN AEROBIC ONLY Blood Culture results may not be optimal due to an inadequate volume of blood received in culture bottles   Culture   Final    NO GROWTH 2 DAYS Performed at [facility], [address] [place], [place] [address]    Report Status PENDING  Incomplete  Culture, blood (Routine X 2) w Reflex to ID Panel     Status: None (Preliminary result)   Collection Time: 11/28/23  7:18 PM   Specimen: BLOOD LEFT HAND  Result Value Ref Range Status   Specimen  Description BLOOD LEFT HAND  Final   Special Requests   Final    AEROBIC BOTTLE ONLY Blood Culture results may not be optimal due to an inadequate volume of blood received in culture bottles   Culture   Final    NO GROWTH 2 DAYS Performed at [facility], [address], [place], [place] [address]    Report Status PENDING  Incomplete     Radiology Studies: CT ANGIO HEAD NECK W WO CM Result Date: 11/29/2023 CLINICAL DATA:  Stroke follow-up EXAM: CT ANGIOGRAPHY HEAD AND NECK WITH AND WITHOUT CONTRAST TECHNIQUE: Multidetector CT imaging of the head and neck was performed  using the standard protocol during bolus administration of intravenous contrast. Multiplanar CT image reconstructions and MIPs were obtained to evaluate the vascular anatomy. Carotid stenosis measurements (when applicable) are obtained utilizing NASCET criteria, using the distal internal carotid diameter as the denominator. RADIATION DOSE REDUCTION: This exam was performed according to the departmental dose-optimization program which includes automated exposure control, adjustment of the mA and/or kV according to patient size and/or use of iterative reconstruction technique. CONTRAST:  75mL OMNIPAQUE  IOHEXOL  350 MG/ML SOLN COMPARISON:  11/28/2023 brain MRI FINDINGS: CT HEAD FINDINGS Brain: There is no mass, hemorrhage or extra-axial collection. The size and configuration of the ventricles and extra-axial CSF spaces are normal. There is hypoattenuation of the white matter, most commonly indicating chronic small vessel disease. Vascular: No hyperdense vessel or unexpected vascular calcification. Skull: The visualized skull base, calvarium and extracranial soft tissues are normal. Sinuses/Orbits: No fluid levels or advanced mucosal thickening of the visualized paranasal sinuses. No mastoid or middle ear effusion. Normal orbits. CTA NECK FINDINGS Skeleton: No acute abnormality or high grade bony spinal canal stenosis. Other neck: Normal  pharynx, larynx and major salivary glands. No cervical lymphadenopathy. Unremarkable thyroid  gland. Upper chest: No pneumothorax or pleural effusion. No nodules or masses. Aortic arch: There is calcific atherosclerosis of the aortic arch. Conventional 3 vessel aortic branching pattern. RIGHT carotid system: No dissection, occlusion or aneurysm. Mild atherosclerotic calcification at the carotid bifurcation without hemodynamically significant stenosis. LEFT carotid system: No dissection, occlusion or aneurysm. There is mixed density atherosclerosis extending into the proximal ICA, resulting in less than 50% stenosis. Vertebral arteries: Codominant configuration. There is no dissection, occlusion or flow-limiting stenosis to the skull base (V1-V3 segments). CTA HEAD FINDINGS POSTERIOR CIRCULATION: Vertebral arteries are normal. No proximal occlusion of the anterior or inferior cerebellar arteries. Basilar artery is normal. Superior cerebellar arteries are normal. Posterior cerebral arteries are normal. ANTERIOR CIRCULATION: Bilateral intracranial ICA atherosclerosis with moderate stenosis of the supraclinoid left ICA. Anterior cerebral arteries are normal. Middle cerebral arteries are normal. Venous sinuses: As permitted by contrast timing, patent. Anatomic variants: The right A1 segment of the ACA is absent. Both posterior communicating arteries are patent. Review of the MIP images confirms the above findings. IMPRESSION: 1. No emergent large vessel occlusion. 2. 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 3. Bilateral carotid bifurcation atherosclerosis without hemodynamically significant stenosis. Aortic Atherosclerosis (ICD10-I70.0). Electronically Signed   By: Juanetta Nordmann M.D.   On: 11/29/2023 20:13   MR BRAIN WO CONTRAST Result Date: 11/28/2023 EXAM: MRI BRAIN WITHOUT CONTRAST 11/28/2023 11:14:22 AM TECHNIQUE: Multiplanar multisequence MRI of the head/brain was performed without the administration of  intravenous contrast. COMPARISON: MR head without and with contrast 04/05/2022. CLINICAL HISTORY: Neuro deficit, acute, stroke suspected. FINDINGS: BRAIN AND VENTRICLES: Multiple bilateral punctate nonhemorrhagic infarcts are present. A punctate cortical infarct was present in the right middle frontal gyrus on image 42 of series 2. A new punctate white matter infarct is present in the posterior left centrum semiovale on image 37. A punctate white matter infarct is present in the posterior right corona radiata on image 35 with a second lesion more anterior on image 31. A subcortical punctate white matter infarct is present in the posterior right temporal lobe on image 23. A medial inferior posterior left cerebellar lesion is present on image 13. Periventricular and subcortical white matter T2 hyperintensities and moderate generalized atrophy demonstrate slight progression compared to the prior exam. White matter changes extend into the brainstem. Punctate foci of susceptibility again noted  in the cerebellum bilaterally. No focal abnormality in the posterior right hippocampus, stable. ORBITS: Bilateral lens replacements are noted. The globes and orbits are otherwise within normal limits. SINUSES AND MASTOIDS: Fluid levels are present in the sphenoid sinuses, right greater than left. The paranasal sinuses and mastoid air cells are otherwise clear. BONES AND SOFT TISSUES: Normal marrow signal. No acute soft tissue abnormality. IMPRESSION: 1. Multiple acute bilateral punctate nonhemorrhagic infarcts in multiple vascular distributions as described. 2. Slight progression of periventricular and subcortical white matter T2 hyperintensities and moderate generalized atrophy compared to the prior exam. White matter changes extend into the brainstem. Findings are consistent with progressive chronic microvascular disease. 3. Fluid levels in the sphenoid sinuses, right greater than left suggesting acute sinusitis . Electronically  signed by: Audree Leas MD 11/28/2023 12:19 PM EDT RP Workstation: [id number]     Scheduled Meds:  sodium chloride    Intravenous Once   sodium chloride    Intravenous Once   sodium chloride    Intravenous Once   amLODipine   5 mg Oral QPM   vitamin C  1,000 mg Oral Daily   calcitRIOL  0.25 mcg Oral Q T,Th,Sa-HD   calcium  carbonate  1 tablet Oral TID WC   Chlorhexidine  Gluconate Cloth  6 each Topical Daily   Chlorhexidine  Gluconate Cloth  6 each Topical Q0600   Chlorhexidine  Gluconate Cloth  6 each Topical Q0600   cloNIDine   0.1 mg Transdermal Weekly   [START ON 12/01/2023] darbepoetin (ARANESP ) injection - NON-DIALYSIS  200 mcg Subcutaneous Q Thu-1800   insulin  aspart  0-5 Units Subcutaneous QHS   insulin  aspart  0-6 Units Subcutaneous TID WC   metoprolol tartrate  50 mg Oral BID   nutrition supplement (JUVEN)  1 packet Oral BID BM   sodium chloride  flush  10-40 mL Intracatheter Q12H   thiamine  100 mg Oral Daily   zinc sulfate (50mg  elemental zinc)  220 mg Oral Daily   Continuous Infusions:  sodium chloride        LOS: 9 days   Modena Andes, MD [facility]  11/30/2023, 9:58 AM   *Please note that this is a verbal dictation therefore any spelling or grammatical errors are due to the "[employer] One" system interpretation.  Please page via Amion and do not message via secure chat for urgent patient care matters. Secure chat can be used for non urgent patient care matters.  How to contact the [facility] Attending or Consulting provider 7A - 7P or covering provider during after hours 7P -7A, for this patient?  Check the care team in [facility] and look for a) attending/consulting [facility] provider listed and b) the [facility] team listed. Page or secure chat 7A-7P. Log into [email or web address] and use [place]'s universal password to access. If you do not have the password, please contact the hospital operator. Locate the [facility] provider you are looking for under [facility] and page to a  number that you can be directly reached. If you still have difficulty reaching the provider, please page the [facility] (Director on Call) for the Hospitalists listed on amion for assistance.

## 2023-11-30 NOTE — Progress Notes (Signed)
 PT Cancellation Note  Patient Details Name: [name] [name] MRN: [id number] DOB: Aug 23, 1965   Cancelled Treatment:    Reason Eval/Treat Not Completed: Patient declined, no reason specified (Pt refused stating "Not right now". Pt has not worked with therapy since inital eval and per chart review pt is a total A using hoyer lift at baseline. PT will sign off as pt appears to be at baseline.)   Taya Ashbaugh 11/30/2023, 4:24 PM

## 2023-11-30 NOTE — Progress Notes (Signed)
 [facility] Progress Note   Subjective:    Pt seen in room Remains very sluggish, responds to voice  Objective Vitals:   11/30/23 0028 11/30/23 0410 11/30/23 0500 11/30/23 0849  BP: (!) 126/59 (!) 139/59  (!) 155/63  Pulse: (!) 55 (!) 58  60  Resp: 18 18  18   Temp: 98 F (36.7 C) 98 F (36.7 C)  97.6 F (36.4 C)  TempSrc:    Oral  SpO2: 99% 100%  99%  Weight:   70.1 kg   Height:       Physical Exam General: sleeping female, awakens to voice, on RA Heart: RRR, no murmurs, rubs or gallops Lungs: CTA bilaterally, respirations unlabored  Abdomen: Soft, non-distended, +BS Extremities: No edema b/l lower extremities Dialysis Access: internal jugular TDC  Dialysis Orders: GO TTS  3h  B350   64kg   TDC  Heparin  5000 Mircera 225 mcg q 2 weeks. 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 Due to infected L foot non-healing. Abx dc'd.  ESRD: HD TTS. HD tomorrow.  HTN: bp's are very good, pt on catapres  patch, norvasc  and metoprolol here as at home. Home hydralazine  on hold.  Volume: note that her EDW will need to decrease post-BKA. No vol excess on exam today.  Anemia of CKD: getting max ESA here as at OP unit. SP 2u prbc 5/25 for Hb 6.0.  follow.  Metabolic bone disease: hypophosphatemia, now improved. Holding binder. On calcitriol.  Encephalopathy: neuro and psych on board, concern for multifocal stroke. 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 Also possible depression/ catatonia. Per neuro/ pmd.  DVT: may consider resuming eliquis for this once Hb stable  Rob Zana Hesselbach  MD  [facility] 11/30/2023, 9:15 AM  Recent Labs  Lab 11/25/23 0759 11/27/23 1105 11/28/23 1224 11/29/23 0520  HGB 8.4* 6.0* 11.8* 12.6  ALBUMIN  2.1* 2.0*  --   --   CALCIUM  8.0* 7.4*  --   --   PHOS 2.3* 3.5  --   --   CREATININE 2.23* 3.88*  --   --   K 3.9 4.4  --   --     Inpatient medications:  sodium chloride    Intravenous Once    sodium chloride    Intravenous Once   sodium chloride    Intravenous Once   amLODipine   5 mg Oral QPM   vitamin C  1,000 mg Oral Daily   calcitRIOL  0.25 mcg Oral Q T,Th,Sa-HD   calcium  carbonate  1 tablet Oral TID WC   Chlorhexidine  Gluconate Cloth  6 each Topical Daily   Chlorhexidine  Gluconate Cloth  6 each Topical Q0600   cloNIDine   0.1 mg Transdermal Weekly   [START ON 12/01/2023] darbepoetin (ARANESP ) injection - NON-DIALYSIS  200 mcg Subcutaneous Q Thu-1800   insulin  aspart  0-5 Units Subcutaneous QHS   insulin  aspart  0-6 Units Subcutaneous TID WC   metoprolol tartrate  50 mg Oral BID   nutrition supplement (JUVEN)  1 packet Oral BID BM   sodium chloride  flush  10-40 mL Intracatheter Q12H   thiamine  100 mg Oral Daily   zinc sulfate (50mg  elemental zinc)  220 mg Oral Daily    sodium chloride      acetaminophen  **OR** acetaminophen , calcium  carbonate (dosed in mg elemental calcium ), camphor-menthol  **AND** hydrOXYzine , docusate sodium , feeding supplement (NEPRO CARB STEADY), hydrALAZINE , HYDROmorphone  (DILAUDID ) injection, LORazepam, naLOXone  (NARCAN )  injection, ondansetron  **OR** ondansetron  (ZOFRAN ) IV, oxyCODONE , oxyCODONE , prochlorperazine ,  sodium chloride  flush, sorbitol 

## 2023-11-30 NOTE — Progress Notes (Signed)
 Palliative Care Progress Note, Assessment & Plan   Patient Name: [name] [name]       Date: 11/30/2023 DOB: 1966-06-17  Age: 59 y.o. MRN#: [id number] Attending Physician: Modena Andes, MD Primary Care Physician: Alwyn Juba., MD Admit Date: 11/21/2023  Subjective: Patient is sitting up in bed in no apparent distress.  She is awake, alert, and acknowledges my presence.  She is able to make her wishes known.  She is eating a [place] sandwich upon my arrival.  RN is at bedside giving medication for nausea during my visit.  No family or friends present during my visit.  HPI: 58 y.o. female  with past medical history of end-stage renal disease on hemodialysis, GERD, essential hypertension, left eye blindness, diabetes type 2, history of lumbar radiculopathy, admitted on 11/21/2023 with left heel wound and suspicion for possible gangrene.    Patient initially admitted with foot ulcer infection and cellulitis, symptomatic anemia. MRI then showed osteomyelitis and after initially refusing BKA, patient agreed. She also has refused labs, vital signs, medications and personal care.   PMT has been re-consulted to assist with goals of care conversation.  Summary of counseling/coordination of care: Extensive chart review completed prior to meeting patient including labs, vital signs, imaging, progress notes, orders, and available advanced directive documents from current and previous encounters.   After reviewing the patient's chart and assessing the patient at bedside, I engaged in a brief life review discussed.  Patient is originally from [place] .  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  She has 3 children, her son is her oldest child.  I attempted to gauge patient's understanding of  her current medical situation.  She shares she knows that she had an amputation.  She is asking what else is going on with her.  Discussed postoperation lethargy, MRI results-multiple bilateral small infarcts, and psychiatry's evaluation of dementia with catatonia.  Reviewed the recommendations have been put in place to give her scheduled Ativan, which appears to have improved her mood and cognition.  She shares she is feeling more like herself today.  Space and opportunity provided for her to ask questions in regards to her medical situation.  I then spoke with patient in regards to symptom management and goals of care.   Patient endorses spasms of her left side.  She shares this is different from the pain she is experiencing.  Discussed phantom leg pain and muscle cramping after BKA.  I would recommend initiating Robaxin  500 mg p.o. every 6 hours as needed to assist with muscle spasms.  Patient denies pain, headache, chest pain, or other acute issues at this time.  Discussed importance of advanced care planning and documenting patient's wishes in the event that she is unable to speak for herself.  Reviewed that her husband is her current default next of kin decision maker.  Upon hearing this information, patient shook her head no.  She shares that she would want her son Reymundo Caulk to be her decision maker if she is incapacitated.  However, I do not think patient has full capacity to create an advance directive naming her son at this time.  However, I shared that we  will continue to review her goals and boundaries.  She was in agreement.  Extensive time spent establishing rapport and reviewing hospitalization with patient.  Patient received pain medicine and was preparing to work with PT.  Patient in agreement for PMT to continue to follow and support.  Plan to follow-up with patient tomorrow to continue goals of care discussions..  Physical Exam Vitals reviewed.  Constitutional:      General: She is  not in acute distress.    Appearance: She is normal weight.  HENT:     Head: Normocephalic.     Mouth/Throat:     Mouth: Mucous membranes are moist.  Eyes:     Pupils: Pupils are equal, round, and reactive to light.  Cardiovascular:     Pulses: Normal pulses.  Pulmonary:     Effort: Pulmonary effort is normal.  Abdominal:     Palpations: Abdomen is soft.  Skin:    General: Skin is warm and dry.  Neurological:     Mental Status: She is alert and oriented to person, place, and time.  Psychiatric:        Mood and Affect: Mood normal.        Behavior: Behavior normal.        Thought Content: Thought content normal.        Judgment: Judgment normal.             Total Time 50 minutes   Time spent includes: Detailed review of medical records (labs, imaging, vital signs), medically appropriate exam (mental status, respiratory, cardiac, skin), discussed with treatment team, counseling and educating patient, family and staff, documenting clinical information, medication management and coordination of care.  Judeen Nose L. Rebbeca Campi, DNP, FNP-BC Palliative Medicine Team

## 2023-11-30 NOTE — Plan of Care (Signed)
  Problem: Education: Goal: Knowledge of General Education information will improve Description: Including pain rating scale, medication(s)/side effects and non-pharmacologic comfort measures Outcome: Progressing   Problem: Health Behavior/Discharge Planning: Goal: Ability to manage health-related needs will improve Outcome: Progressing   Problem: Clinical Measurements: Goal: Ability to maintain clinical measurements within normal limits will improve Outcome: Progressing Goal: Will remain free from infection Outcome: Progressing Goal: Diagnostic test results will improve Outcome: Progressing Goal: Respiratory complications will improve Outcome: Progressing Goal: Cardiovascular complication will be avoided Outcome: Progressing   Problem: Activity: Goal: Risk for activity intolerance will decrease Outcome: Progressing   Problem: Nutrition: Goal: Adequate nutrition will be maintained Outcome: Progressing   Problem: Coping: Goal: Level of anxiety will decrease Outcome: Progressing   Problem: Elimination: Goal: Will not experience complications related to bowel motility Outcome: Progressing Goal: Will not experience complications related to urinary retention Outcome: Progressing   Problem: Pain Managment: Goal: General experience of comfort will improve and/or be controlled Outcome: Progressing   Problem: Safety: Goal: Ability to remain free from injury will improve Outcome: Progressing   Problem: Skin Integrity: Goal: Risk for impaired skin integrity will decrease Outcome: Progressing   Problem: Education: Goal: Ability to describe self-care measures that may prevent or decrease complications (Diabetes Survival Skills Education) will improve Outcome: Progressing Goal: Individualized Educational Video(s) Outcome: Progressing   Problem: Coping: Goal: Ability to adjust to condition or change in health will improve Outcome: Progressing   Problem: Fluid  Volume: Goal: Ability to maintain a balanced intake and output will improve Outcome: Progressing   Problem: Health Behavior/Discharge Planning: Goal: Ability to identify and utilize available resources and services will improve Outcome: Progressing Goal: Ability to manage health-related needs will improve Outcome: Progressing   Problem: Metabolic: Goal: Ability to maintain appropriate glucose levels will improve Outcome: Progressing   Problem: Nutritional: Goal: Maintenance of adequate nutrition will improve Outcome: Progressing Goal: Progress toward achieving an optimal weight will improve Outcome: Progressing   Problem: Skin Integrity: Goal: Risk for impaired skin integrity will decrease Outcome: Progressing   Problem: Tissue Perfusion: Goal: Adequacy of tissue perfusion will improve Outcome: Progressing   Problem: Education: Goal: Knowledge of the prescribed therapeutic regimen will improve Outcome: Progressing Goal: Ability to verbalize activity precautions or restrictions will improve Outcome: Progressing Goal: Understanding of discharge needs will improve Outcome: Progressing   Problem: Activity: Goal: Ability to perform//tolerate increased activity and mobilize with assistive devices will improve Outcome: Progressing   Problem: Clinical Measurements: Goal: Postoperative complications will be avoided or minimized Outcome: Progressing   Problem: Self-Care: Goal: Ability to meet self-care needs will improve Outcome: Progressing   Problem: Self-Concept: Goal: Ability to maintain and perform role responsibilities to the fullest extent possible will improve Outcome: Progressing   Problem: Pain Management: Goal: Pain level will decrease with appropriate interventions Outcome: Progressing   Problem: Skin Integrity: Goal: Demonstration of wound healing without infection will improve Outcome: Progressing   Problem: Education: Goal: Knowledge of disease or  condition will improve Outcome: Progressing Goal: Knowledge of secondary prevention will improve (MUST DOCUMENT ALL) Outcome: Progressing Goal: Knowledge of patient specific risk factors will improve (DELETE if not current risk factor) Outcome: Progressing

## 2023-11-30 NOTE — Evaluation (Addendum)
 Speech Language Pathology Evaluation Patient Details Name: [name] [name] MRN: [id number] DOB: 1965-08-06 Today's Date: 11/30/2023 Time: 4098-1191 SLP Time Calculation (min) (ACUTE ONLY): 19 min  Problem List:  Patient Active Problem List   Diagnosis Date Noted   Osteomyelitis of left foot (HCC) 11/24/2023   Cellulitis of left lower extremity 11/21/2023   Anemia due to chronic kidney disease 11/21/2023   Hammertoes of both feet 05/21/2022   Hyperpigmentation 05/21/2022   Hypertensive emergency 05/20/2022   Hypertensive urgency, malignant 05/19/2022   Pyocystis 05/19/2022   Hypokalemia 05/19/2022   Cellulitis 04/09/2021   Cellulitis of suprapubic region 04/09/2021   Leukocytosis 04/09/2021   DM2 (diabetes mellitus, type 2) (HCC) 04/09/2021   Osteoporosis 10/13/2020   Fever    Closed fracture of left proximal tibia 10/10/2020   Fracture of femoral neck, right (HCC) 10/02/2020   Pressure injury of skin 03/12/2020   Sepsis (HCC) 03/07/2020   ESRD (end stage renal disease) (HCC) 03/07/2020   Hyperkalemia 03/07/2020   Acute cystitis 03/07/2020   Acute metabolic encephalopathy 03/07/2020   Impaired vision in both eyes 11/19/2015   Diabetes mellitus with complication (HCC)    Intractable headache    Syncope 07/28/2015   Chronic headaches 07/28/2015   Chest pain 05/22/2015   Urinary frequency 03/13/2015   Easy bruising 10/09/2014   Encounter for preventative adult health care exam with abnormal findings 01/11/2014   Chronic constipation 01/11/2014   GERD (gastroesophageal reflux disease) 01/11/2014   Helicobacter positive gastritis 12/06/2013   Dizziness and giddiness 10/12/2013   Essential hypertension 09/19/2013   Back pain 06/11/2013   Lumbosacral spondylosis without myelopathy 02/20/2013   Diabetic neuropathy (HCC) 01/09/2013   DM (diabetes mellitus), type 2, uncontrolled 10/02/2012   MITRAL REGURGITATION, mild 10/31/2009   Pleural effusion 10/20/2009   Past Medical  History:  Past Medical History:  Diagnosis Date   Colitis    Diabetes mellitus    ESRD (end stage renal disease) (HCC)    on HD (M,W,F)   Family history of adverse reaction to anesthesia    " my uncle's heart stoped "   Fracture of femoral neck, right (HCC) 10/02/2020   GERD (gastroesophageal reflux disease)    Heart murmur    Hypertension    Impaired vision in both eyes 11/19/2015   Mitral regurgitation    Neuropathy in diabetes (HCC)    Syncope and collapse 07/28/2015   Past Surgical History:  Past Surgical History:  Procedure Laterality Date   ABDOMINAL HYSTERECTOMY  1999   for fibroids. she thinks one ovary was left   AMPUTATION Left 11/25/2023   Procedure: AMPUTATION BELOW KNEE LEFT;  Surgeon: Timothy Ford, MD;  Location: [facility] OR;  Service: Orthopedics;  Laterality: Left;   CATARACT EXTRACTION     CESAREAN SECTION     CHOLECYSTECTOMY     EYE SURGERY Left retinal   GANGLION CYST EXCISION     HIP PINNING,CANNULATED Right 10/02/2020   Procedure: CANNULATED HIP PINNING;  Surgeon: Hardy Lia, MD;  Location: [facility] OR;  Service: Orthopedics;  Laterality: Right;   INCISION AND DRAINAGE ABSCESS N/A 04/10/2021   Procedure: INCISION AND DRAINAGE ABSCESS LABIA;  Surgeon: Caralyn Chandler, MD;  Location: [facility] OR;  Service: General;  Laterality: N/A;   IR FLUORO GUIDE CV LINE LEFT  10/13/2020   IR PTA VENOUS EXCEPT DIALYSIS CIRCUIT  10/13/2020   ORIF TIBIA PLATEAU Left 10/09/2020   Procedure: OPEN REDUCTION INTERNAL FIXATION (ORIF) TIBIAL PLATEAU;  Surgeon: Hardy Lia, MD;  Location: [facility] OR;  Service: Orthopedics;  Laterality: Left;   TONSILLECTOMY     TUBAL LIGATION     HPI:  Pt is a 58 y.o. female who presented 11/21/23 from SNF with L heel wound.  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 PMH: ESRD on HD, syncope and collapse, diabetic neuropathy, mitral regurgitation, HTN, heart murmur, colitis, and DM2, L eye blindness. MRI a punctate cortical infarct was present in the right middle frontal gyrus, a new  punctate white matter infarct is present in the posterior left centrum semiovale, punctate white matter infarct is present in the posterior right corona radiata  A subcortical punctate white matter infarct is present in the posterior right temporal lobe, a medial inferior posterior left cerebellar lesion.   Assessment / Plan / Recommendation Clinical Impression  Assessment was limited by pt's initial lethargy and ability to participate and stated "these are the same questions." Initially to most questions she did not respond to but as she became more awake responses increased. Her speech was intelligible and language appeared functional. Cognitive impairments noted in the areas of attention, memory, awareness, problem solving.  Pt appears to have visual deficits. SLP suspects as pt becomes more alert and is willing to participate in therapy will be able to have a better understanding of her cognitive abilities (suspect she can do more than is currently presenting).    SLP Assessment  SLP Recommendation/Assessment: Patient needs continued Speech Lanaguage Pathology Services SLP Visit Diagnosis: Cognitive communication deficit (R41.841)    Recommendations for follow up therapy are one component of a multi-disciplinary discharge planning process, led by the attending physician.  Recommendations may be updated based on patient status, additional functional criteria and insurance authorization.    Follow Up Recommendations  Acute inpatient rehab (3hours/day)    Assistance Recommended at Discharge  Frequent or constant Supervision/Assistance  Functional Status Assessment Patient has had a recent decline in their functional status and demonstrates the ability to make significant improvements in function in a reasonable and predictable amount of time.  Frequency and Duration min 2x/week  2 weeks      SLP Evaluation Cognition  Overall Cognitive Status: Impaired/Different from  baseline Arousal/Alertness: Lethargic (became more awake as session progressed) Orientation Level: Oriented to person;Disoriented to situation (oriented to month and yr) Year: 2025 Month: May Attention: Sustained Sustained Attention: Impaired Sustained Attention Impairment: Verbal basic Memory: Impaired Memory Impairment:  (0/3 words) Awareness: Impaired Awareness Impairment: Intellectual impairment;Emergent impairment Problem Solving: Impaired Problem Solving Impairment: Verbal basic Behaviors:  (initially irritated) Safety/Judgment: Impaired       Comprehension  Auditory Comprehension Overall Auditory Comprehension: Appears within functional limits for tasks assessed Visual Recognition/Discrimination Discrimination: Not tested Reading Comprehension Reading Status: Not tested    Expression Expression Primary Mode of Expression: Verbal Verbal Expression Overall Verbal Expression: Appears within functional limits for tasks assessed Level of Generative/Spontaneous Verbalization: Sentence Repetition:  (NT) Naming:  (TBA) Pragmatics: Impairment Impairments: Eye contact Written Expression Written Expression: Not tested   Oral / Motor  Oral Motor/Sensory Function Overall Oral Motor/Sensory Function: Mild impairment Facial ROM: Reduced right;Suspected CN VII (facial) dysfunction Facial Symmetry: Abnormal symmetry right;Suspected CN VII (facial) dysfunction Facial Strength: Reduced right;Suspected CN VII (facial) dysfunction Lingual ROM: Within Functional Limits Lingual Symmetry: Within Functional Limits Motor Speech Overall Motor Speech: Appears within functional limits for tasks assessed Respiration: Within functional limits Phonation: Normal Resonance: Within functional limits Articulation: Within functional limitis Intelligibility: Intelligible Motor Planning: Witnin functional limits Motor Speech Errors: Not applicable  Ladasha, [place] 11/30/2023,  10:19 AM

## 2023-12-01 DIAGNOSIS — L03116 Cellulitis of left lower limb: Secondary | ICD-10-CM | POA: Diagnosis not present

## 2023-12-01 DIAGNOSIS — I639 Cerebral infarction, unspecified: Secondary | ICD-10-CM | POA: Diagnosis not present

## 2023-12-01 DIAGNOSIS — M869 Osteomyelitis, unspecified: Secondary | ICD-10-CM | POA: Diagnosis not present

## 2023-12-01 DIAGNOSIS — E1122 Type 2 diabetes mellitus with diabetic chronic kidney disease: Secondary | ICD-10-CM | POA: Diagnosis not present

## 2023-12-01 LAB — CBC
HCT: 33.3 % — ABNORMAL LOW (ref 36.0–46.0)
Hemoglobin: 10.4 g/dL — ABNORMAL LOW (ref 12.0–15.0)
MCH: 23.9 pg — ABNORMAL LOW (ref 26.0–34.0)
MCHC: 31.2 g/dL (ref 30.0–36.0)
MCV: 76.6 fL — ABNORMAL LOW (ref 80.0–100.0)
Platelets: 388 10*3/uL (ref 150–400)
RBC: 4.35 MIL/uL (ref 3.87–5.11)
WBC: 12.9 10*3/uL — ABNORMAL HIGH (ref 4.0–10.5)
nRBC: 0 % (ref 0.0–0.2)

## 2023-12-01 LAB — RENAL FUNCTION PANEL
Albumin: 2 g/dL — ABNORMAL LOW (ref 3.5–5.0)
Anion gap: 14 (ref 5–15)
BUN: 26 mg/dL — ABNORMAL HIGH (ref 6–20)
CO2: 24 mmol/L (ref 22–32)
Calcium: 7.7 mg/dL — ABNORMAL LOW (ref 8.9–10.3)
Chloride: 98 mmol/L (ref 98–111)
Creatinine, Ser: 3.28 mg/dL — ABNORMAL HIGH (ref 0.44–1.00)
GFR, Estimated: 16 mL/min — ABNORMAL LOW (ref >60)
Glucose, Bld: 82 mg/dL (ref 70–99)
Phosphorus: 2.6 mg/dL (ref 2.5–4.6)
Potassium: 4 mmol/L (ref 3.5–5.1)
Sodium: 136 mmol/L (ref 135–145)

## 2023-12-01 LAB — GLUCOSE, CAPILLARY
Glucose-Capillary: 105 mg/dL — ABNORMAL HIGH (ref 70–99)
Glucose-Capillary: 69 mg/dL — ABNORMAL LOW (ref 70–99)
Glucose-Capillary: 88 mg/dL (ref 70–99)
Glucose-Capillary: 87 mg/dL (ref 70–99)
Glucose-Capillary: 150 mg/dL — ABNORMAL HIGH (ref 70–99)

## 2023-12-01 MED ORDER — HEPARIN SODIUM (PORCINE) 1000 UNIT/ML DIALYSIS
3000.0000 [IU] | Freq: Once | INTRAMUSCULAR | Status: AC
  Administered 2023-12-01: 3000 [IU] via INTRAVENOUS_CENTRAL

## 2023-12-01 MED ORDER — HEPARIN SODIUM (PORCINE) 1000 UNIT/ML DIALYSIS: 2000.0000 [IU] | INTRAMUSCULAR | Status: DC | PRN

## 2023-12-01 NOTE — Progress Notes (Signed)
 Pt tolerated HD tx well and goal met.  12/01/23 1215  Vitals  Temp 97.6 F (36.4 C)  Temp Source Oral  BP 138/63  BP Location Right Arm  BP Method Automatic  Patient Position (if appropriate) Lying  Pulse Rate 60  Resp 10  Oxygen Therapy  SpO2 100 %  O2 Device Room Air  During Treatment Monitoring  Intra-Hemodialysis Comments Tx completed  Post Treatment  Dialyzer Clearance Lightly streaked  Hemodialysis Intake (mL) 0 mL  Liters Processed 68  Fluid Removed (mL) 2000 mL  Tolerated HD Treatment Yes  Post-Hemodialysis Comments Pt goal met.  Hemodialysis Catheter Left Internal jugular  Placement Date/Time: 10/13/20 1501   Time Out: Correct patient;Correct site;Correct procedure  Maximum sterile barrier precautions: Hand hygiene;Cap;Mask  Site Prep: Other (comment)  Local Anesthetic: Injectable - 1% Lidocaine   Ultrasound Used?: No  V...  Site Condition No complications  Blue Lumen Status Heparin  locked  Red Lumen Status Heparin  locked  Catheter fill solution Heparin  1000 units/ml  Catheter fill volume (Arterial) 1.9 cc  Catheter fill volume (Venous) 1.9  Dressing Type Gauze/Drain sponge;Transparent  Dressing Status Antimicrobial disc/dressing in place;Clean, Dry, Intact  Interventions New dressing  Drainage Description None  Dressing Change Due 12/06/23  Post treatment catheter status Capped and Clamped

## 2023-12-01 NOTE — Progress Notes (Signed)
 Palliative Care Progress Note, Assessment & Plan   Patient Name: [name] [name]       Date: 12/01/2023 DOB: 1966/03/23  Age: 58 y.o. MRN#: [id number] Attending Physician: Modena Andes, MD Primary Care Physician: Alwyn Juba., MD Admit Date: 11/21/2023  Subjective: Patient is sitting up in bed in no apparent distress.  She acknowledges my presence and is able to make her wishes known.  RN and nurse tech are at bedside, settling patient in after she was just transferred back from hemodialysis suite.  No family or friends present during my visit.  HPI: 58 y.o. female  with past medical history of end-stage renal disease on hemodialysis, GERD, essential hypertension, left eye blindness, diabetes type 2, history of lumbar radiculopathy, admitted on 11/21/2023 with left heel wound and suspicion for possible gangrene.    Patient initially admitted with foot ulcer infection and cellulitis, symptomatic anemia. MRI then showed osteomyelitis and after initially refusing BKA, patient agreed. She also has refused labs, vital signs, medications and personal care.   PMT has been re-consulted to assist with goals of care conversation.  Summary of counseling/coordination of care: Extensive chart review completed prior to meeting patient including labs, vital signs, imaging, progress notes, orders, and available advanced directive documents from current and previous encounters.   After reviewing the patient's chart and assessing the patient at bedside, I spoke with patient in regards to symptom management and goals of care.   Patient endorses the muscle spasms and pain in her left side have significantly improved since our discussion yesterday.  She has acute complaints of feeling cold and hungry.  She is just received a  CHG bath and has not had anything to eat since she was in the hemodialysis suite earlier.  Warm blanket given.  RN at bedside is placing patient's lunch order.  I attempted to gauge patient's understanding of her current medical situation.  She recalls visiting with me yesterday, discussing her family, and utilizing Ativan to help with her catatonic state.  Though she speaks with delayed responses and short responses, she is able to engage appropriately with me.  Discussed importance of nutrition as a significant indicator of patient's overall prognosis.  Additionally, discussed cognitive and functional status.  Patient declined to work with PT yesterday.  When asked why she declined, patient could not specify anything.  Discussed importance of mobility for healing.  She endorses that she is willing to work with PT today after she is able to eat something.  Patient endorses that she is accepting of all offered, available, and appropriate medical interventions to sustain her life.  Full code and full scope remain.  Symptom burden is low.  Goals are clear.  PMT will continue to follow patient peripherally and reengage when appropriate.  Please reach out to PMT should goals change, at patient/family's request, or if patient's health deteriorates during hospitalization.  Physical Exam Vitals reviewed.  Constitutional:      General: She is not in acute distress.    Appearance: She is normal weight.  HENT:     Head: Normocephalic.     Nose: Nose normal.     Mouth/Throat:     Mouth: Mucous membranes are  moist.  Cardiovascular:     Rate and Rhythm: Normal rate.  Pulmonary:     Effort: Pulmonary effort is normal.  Abdominal:     Palpations: Abdomen is soft.  Skin:    General: Skin is warm and dry.  Neurological:     Mental Status: She is alert and oriented to person, place, and time.  Psychiatric:        Mood and Affect: Mood normal.        Behavior: Behavior normal.        Thought Content:  Thought content normal.        Judgment: Judgment normal.             Total Time 25 minutes   Time spent includes: Detailed review of medical records (labs, imaging, vital signs), medically appropriate exam (mental status, respiratory, cardiac, skin), discussed with treatment team, counseling and educating patient, family and staff, documenting clinical information, medication management and coordination of care.  Judeen Nose L. Rebbeca Campi, DNP, FNP-BC Palliative Medicine Team

## 2023-12-01 NOTE — Progress Notes (Addendum)
 Hypoglycemic Event  CBG: 69  Treatment: 4 oz juice/soda  Symptoms: None  Follow-up CBG: 88  Time: 1338    Possible Reasons for Event: Inadequate meal intake (at HD all morning)     [name] [name]

## 2023-12-01 NOTE — Care Management Important Message (Signed)
 Important Message  Patient Details  Name: [name] [name] MRN: [id number] Date of Birth: September 09, 1965   Important Message Given:  Yes - Medicare IM     Wynonia Hedges 12/01/2023, 9:21 AM

## 2023-12-01 NOTE — Progress Notes (Signed)
 PROGRESS NOTE    [name] [name]  ZOX:[id number] DOB: 1966-01-27 DOA: 11/21/2023 PCP: Alwyn Juba., MD   Brief Narrative:  The patient 58 year old African-American female with a past medical history significant for ESRD on hemodialysis Tuesday Thursday Saturdays, GERD, essential hypertension, left eye blindness, diabetes mellitus type 2 which is fairly well-controlled, history of lumbar radiculopathy presented from her SNF from [facility] with a left heel wound and cellulitis and suspicion for possible and gas-forming infection. She has been undergoing wound care treatments but states that the foot has gotten worse. Workup in the ED shows a wound infection and cellulitis but there is concern for deeper infection so we will obtain an MRI of the heel and foot and obtain vascular ABIs. On presentation she was found to have a hemoglobin of 6.1 with no obvious source of bleeding so she was transfused 1 unit of PRBC.  Nephrology and orthopedics consulted.  Hospitalization complicated with multifocal stroke and catatonia, neurology and psychiatry consulted.  Assessment & Plan:   Principal Problem:   Cellulitis of left lower extremity Active Problems:   Diabetic neuropathy (HCC)   Essential hypertension   GERD (gastroesophageal reflux disease)   ESRD (end stage renal disease) (HCC)   DM2 (diabetes mellitus, type 2) (HCC)   Anemia due to chronic kidney disease   Osteomyelitis of left foot (HCC)  Infected Diabetic Foot ulcer with Cellulitis/gangrene and gas-forming infection of the left heel/osteomyelitis: The unstageable pressure heel left ulcer with a wound bed black eschar.  States that she sees the wound clinic but is not getting better.  she received Unasyn  and linezolid  in the ED and was later started on cefepime  and vancomycin .  Blood cultures x 2 were obtained and NGTD.  MRI confirms osteomyelitis.  Ortho consulted, patient seen by Dr. 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  Has wound VAC attached, suction is good per Dr. Julio Ohm.  Antibiotic discontinued on 11/28/2023.  Multifocal stroke: Postsurgery, patient had intermittent somnolence and not having proper conversations.  Initially it was being suspected that patient was severely depressed however it was noted on 11/28/2023 that patient appeared to be making efforts to talk but she just could not and could not follow commands or left extremities and thus stroke was suspected and stat MRI was ordered which confirmed multifocal ischemic stroke.  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  Bilateral carotid bifurcation atherosclerosis without significant stenosis.  Per neuro recommendation, Eliquis was resumed on 11/30/2023 now that hemoglobin is stable.  LDL 93, goal <70, patient on atorvastatin  20 mg.  Neurology signed off 11/30/2023.  ?  Depression/catatonia: Due to patient's unwillingness or inability to talk and refusal of labs and medical care, psychiatry was consulted on the same day of 11/28/2023, they suspected catatonia, patient was given Ativan and per psychiatry note, she improved making a diagnosis of catatonia.  She is still having intermittent symptoms.  She is on as needed Ativan for catatonia symptoms.  Psychiatry following.  Appreciate their help.   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  Intermittent hypoglycemia in the setting of poor p.o. intake.  C/w Sensitive Novolog  SSI AC/HS. Has been refusing her Insulin  regimen.    HypoNa+: Mild, asymptomatic, improving.   Essential HTN: Blood pressure fairly controlled.  C/w Amlodipine  5  mg po Daily, clonidine  patch, and Metoprolol  Tartrate 50 mg po BID.    ESRD on HD TTSat: Nephrology following.  Appreciate their help with management of this.    Symptomatic Anemia/Microcytic Anemia/Anemia of Chronic Kidney Disease: Hemoglobin 6.1 upon arrival, status post 1 unit of PRBC transfusion, hemoglobin improved to 8.4 but dropped to 6.0 on 11/27/2023 for which she received 2 units of PRBC transfusion in dialysis unit.  Patient continued to refuse blood draws and finally we were able to get labs on her on the afternoon of 11/28/2023 and hemoglobin is 11.8 followed by 12.6 but down to 10.4 today.  I think this is her real hemoglobin and not a drop.  Will continue Eliquis  and reassess tomorrow/repeat CBC in AM.  Thrombocytosis: Improved.  GOC: Palliative care following.   DVT prophylaxis: apixaban  (ELIQUIS ) tablet 5 mg Start: 11/30/23 1330 SCD's Start: 11/26/23 0743 SCDs Start: 11/21/23 2128   Code Status: Full Code  Family Communication:  None present at bedside.  I have discussed with the husband twice in last 1 week.  I called him [place] yesterday as well as yesterday, both days, the phone kept ringing but no one picked up.  Previously both times when I his wife and did not ask any questions either.  Reportedly, he has not come to see her either and I have not received any message from the nurses about his return call either.    Status is: Inpatient Remains inpatient appropriate because: Status post BKA, anemia, stroke, catatonia.  Patient very sick.  Estimated body mass index is 29.32 kg/m as calculated from the following:   Height as of this encounter: 5' (1.524 m).   Weight as of this encounter: 68.1 kg.  Pressure Injury 11/28/23 Sacrum Stage 3 -  Full thickness tissue loss. Subcutaneous fat may be visible but bone, tendon or muscle are NOT exposed. (Active)  11/28/23 0931  Location: Sacrum  Location Orientation:   Staging: Stage 3 -  Full thickness tissue loss. Subcutaneous fat may be visible but bone, tendon or muscle are NOT exposed.  Wound Description (Comments):   Present on Admission:   Dressing Type Foam - Lift dressing to assess  site every shift 11/30/23 1600   Nutritional Assessment: Body mass index is 29.32 kg/m.Aaron Aas Seen by dietician.  I agree with the assessment and plan as outlined below: Nutrition Status:        . Skin Assessment: I have examined the patient's skin and I agree with the wound assessment as performed by the wound care RN as outlined below: Pressure Injury 11/28/23 Sacrum Stage 3 -  Full thickness tissue loss. Subcutaneous fat may be visible but bone, tendon or muscle are NOT exposed. (Active)  11/28/23 0931  Location: Sacrum  Location Orientation:   Staging: Stage 3 -  Full thickness tissue loss. Subcutaneous fat may be visible but bone, tendon or muscle are NOT exposed.  Wound Description (Comments):   Present on Admission:   Dressing Type Foam - Lift dressing to assess site every shift 11/30/23 1600    Consultants:  Orthopedics  Procedures:  BKA Antimicrobials:  Anti-infectives (From admission, onward)    Start     Dose/Rate Route Frequency Ordered Stop   11/29/23 1200  vancomycin  (VANCOREADY) IVPB 750 mg/150 mL  Status:  Discontinued        750 mg 150 mL/hr over 60 Minutes Intravenous Every T-Th-Sa (Hemodialysis) 11/27/23 1150 11/28/23 0902   11/27/23 1400  vancomycin  (VANCOREADY) IVPB 750 mg/150 mL  750 mg 150 mL/hr over 60 Minutes Intravenous  Once 11/27/23 1150 11/27/23 1757   11/26/23 1708  vancomycin  variable dose per unstable renal function (pharmacist dosing)  Status:  Discontinued         Does not apply See admin instructions 11/26/23 1710 11/27/23 1151   11/26/23 1200  vancomycin  (VANCOREADY) IVPB 750 mg/150 mL  Status:  Discontinued        750 mg 150 mL/hr over 60 Minutes Intravenous Every T-Th-Sa (Hemodialysis) 11/25/23 1015 11/26/23 1710   11/25/23 1109  ceFAZolin  (ANCEF ) 2-4 GM/100ML-% IVPB       Note to Pharmacy: [name], [name]: cabinet override      11/25/23 1109 11/25/23 1303   11/25/23 0600  ceFAZolin  (ANCEF ) IVPB 2g/100 mL premix        2 g 200  mL/hr over 30 Minutes Intravenous On call to O.R. 11/25/23 0214 11/25/23 1329   11/24/23 1200  vancomycin  (VANCOREADY) IVPB 750 mg/150 mL        750 mg 150 mL/hr over 60 Minutes Intravenous Every T-Th-Sa (Hemodialysis) 11/22/23 1437 11/24/23 1253   11/23/23 1330  vancomycin  (VANCOREADY) IVPB 750 mg/150 mL        750 mg 150 mL/hr over 60 Minutes Intravenous  Once 11/23/23 1243 11/23/23 1529   11/22/23 0600  Vancomycin  (VANCOCIN ) 1,250 mg in sodium chloride  0.9 % 250 mL IVPB        1,250 mg 166.7 mL/hr over 90 Minutes Intravenous  Once 11/21/23 2122 11/22/23 0714   11/22/23 0200  piperacillin -tazobactam (ZOSYN ) IVPB 2.25 g  Status:  Discontinued        2.25 g 100 mL/hr over 30 Minutes Intravenous Every 8 hours 11/21/23 2122 11/21/23 2126   11/21/23 2230  ceFEPIme  (MAXIPIME ) 1 g in sodium chloride  0.9 % 100 mL IVPB  Status:  Discontinued        1 g 200 mL/hr over 30 Minutes Intravenous Every 24 hours 11/21/23 2126 11/28/23 0902   11/21/23 2120  vancomycin  variable dose per unstable renal function (pharmacist dosing)  Status:  Discontinued         Does not apply See admin instructions 11/21/23 2122 11/25/23 1015   11/21/23 1600  Ampicillin -Sulbactam (UNASYN ) 3 g in sodium chloride  0.9 % 100 mL IVPB       Placed in "And" Linked Group   3 g 200 mL/hr over 30 Minutes Intravenous  Once 11/21/23 1547 11/21/23 1740   11/21/23 1600  linezolid  (ZYVOX ) IVPB 600 mg       Placed in "And" Linked Group   600 mg 300 mL/hr over 60 Minutes Intravenous  Once 11/21/23 1547 11/21/23 1808         Subjective: Patient seen and been in dialysis today.  Much better today.  Awake.  Answering questions but partially oriented.  Denies any complaints.  Able to follow commands and lifting extremities.  Objective: Vitals:   12/01/23 1000 12/01/23 1030 12/01/23 1100 12/01/23 1215  BP: (!) 103/58 119/61 91/64 138/63  Pulse: 64 60 61 60  Resp: 13 (!) 9 (!) 8 10  Temp:    97.6 F (36.4 C)  TempSrc:    Oral   SpO2: 100% 100% 100% 100%  Weight:    68.1 kg  Height:        Intake/Output Summary (Last 24 hours) at 12/01/2023 1229 Last data filed at 12/01/2023 1215 Gross per 24 hour  Intake 340 ml  Output 2000 ml  Net -1660 ml    [employer]  11/29/23 1433 11/30/23 0500 12/01/23 1215  Weight: 62 kg 70.1 kg 68.1 kg    Examination:  General exam: Appears calm and comfortable  Respiratory system: Clear to auscultation. Respiratory effort normal. Cardiovascular system: S1 & S2 heard, RRR. No JVD, murmurs, rubs, gallops or clicks. No pedal edema. Gastrointestinal system: Abdomen is nondistended, soft and nontender. No organomegaly or masses felt. Normal bowel sounds heard. Central nervous system: Alert and oriented x 2. No focal neurological deficits. Extremities: Left BKA. Skin: No rashes, lesions or ulcers.   Data Reviewed: I have personally reviewed following labs and imaging studies  CBC: Recent Labs  Lab 11/25/23 0759 11/27/23 1105 11/28/23 1224 11/29/23 0520 11/30/23 1038 12/01/23 0830  WBC 19.3* 15.0* 17.4* 14.0* 12.9* 12.9*  NEUTROABS 15.6*  --  14.8* 10.7* 10.2*  --   HGB 8.4* 6.0* 11.8* 12.6 12.6 10.4*  HCT 27.3* 20.2* 35.1* 38.9 39.8 33.3*  MCV 65.6* 65.4* 74.1* 75.5* 76.2* 76.6*  PLT 322 356 320 347 347 388   Basic Metabolic Panel: Recent Labs  Lab 11/25/23 0759 11/27/23 1105 12/01/23 0830  NA 134* 132* 136  K 3.9 4.4 4.0  CL 96* 95* 98  CO2 24 22 24   GLUCOSE 180* 80 82  BUN 13 26* 26*  CREATININE 2.23* 3.88* 3.28*  CALCIUM  8.0* 7.4* 7.7*  PHOS 2.3* 3.5 2.6   GFR: Estimated Creatinine Clearance: 16.3 mL/min (A) (by C-G formula based on SCr of 3.28 mg/dL (H)). Liver Function Tests: Recent Labs  Lab 11/25/23 0759 11/27/23 1105 12/01/23 0830  ALBUMIN  2.1* 2.0* 2.0*   No results for input(s): "LIPASE", "AMYLASE" in the last 168 hours. Recent Labs  Lab 11/28/23 1846  AMMONIA 13   Coagulation Profile: No results for input(s): "INR", "PROTIME"  in the last 168 hours. Cardiac Enzymes: No results for input(s): "CKTOTAL", "CKMB", "CKMBINDEX", "TROPONINI" in the last 168 hours. BNP (last 3 results) No results for input(s): "PROBNP" in the last 8760 hours. HbA1C: No results for input(s): "HGBA1C" in the last 72 hours.  CBG: Recent Labs  Lab 11/30/23 0629 11/30/23 1316 11/30/23 1735 11/30/23 2116 12/01/23 0610  GLUCAP 103* 76 112* 114* 105*   Lipid Profile: Recent Labs    11/29/23 0520  CHOL 132  HDL 21*  LDLCALC 93  TRIG 90  CHOLHDL 6.3   Thyroid  Function Tests: Recent Labs    11/28/23 1846  TSH 4.350   Anemia Panel: Recent Labs    11/28/23 1846  VITAMINB12 800   Sepsis Labs: No results for input(s): "PROCALCITON", "LATICACIDVEN" in the last 168 hours.   Recent Results (from the past 240 hours)  Blood Cultures x 2 sites     Status: None   Collection Time: 11/21/23  4:41 PM   Specimen: BLOOD LEFT ARM  Result Value Ref Range Status   Specimen Description   Final    BLOOD LEFT ARM Performed at [facility], [address], [place], [place] [address]    Special Requests   Final    BOTTLES DRAWN AEROBIC AND ANAEROBIC Blood Culture results may not be optimal due to an inadequate volume of blood received in culture bottles Performed at [facility], [address], [place], [place] [address]    Culture   Final    NO GROWTH 5 DAYS Performed at [facility], [address] [place], [place] [address]    Report Status 11/26/2023 FINAL  Final  Culture, blood (Routine X 2) w Reflex to ID  Panel     Status: None   Collection Time: 11/22/23 12:48 AM   Specimen: BLOOD  Result Value Ref Range Status   Specimen Description BLOOD RIGHT ANTECUBITAL  Final   Special Requests   Final    BOTTLES DRAWN AEROBIC AND ANAEROBIC Blood Culture adequate volume   Culture   Final    NO GROWTH 5 DAYS Performed at [facility], [address] [place], [place] [address]    Report Status  11/27/2023 FINAL  Final  Surgical pcr screen     Status: None   Collection Time: 11/25/23 11:31 AM   Specimen: Nasal Mucosa; Nasal Swab  Result Value Ref Range Status   MRSA, PCR NEGATIVE NEGATIVE Final   Staphylococcus aureus NEGATIVE NEGATIVE Final    Comment: (NOTE) The Xpert SA Assay (FDA approved for NASAL specimens in patients 89 years of age and older), is one component of a comprehensive surveillance program. It is not intended to diagnose infection nor to guide or monitor treatment. Performed at [facility], [address], [place], [place] [address]   Culture, blood (Routine X 2) w Reflex to ID Panel     Status: None (Preliminary result)   Collection Time: 11/28/23  7:18 PM   Specimen: BLOOD LEFT HAND  Result Value Ref Range Status   Specimen Description BLOOD LEFT HAND  Final   Special Requests   Final    BOTTLES DRAWN AEROBIC ONLY Blood Culture results may not be optimal due to an inadequate volume of blood received in culture bottles   Culture   Final    NO GROWTH 3 DAYS Performed at [facility], [address] [place], [place] [address]    Report Status PENDING  Incomplete  Culture, blood (Routine X 2) w Reflex to ID Panel     Status: None (Preliminary result)   Collection Time: 11/28/23  7:18 PM   Specimen: BLOOD LEFT HAND  Result Value Ref Range Status   Specimen Description BLOOD LEFT HAND  Final   Special Requests   Final    AEROBIC BOTTLE ONLY Blood Culture results may not be optimal due to an inadequate volume of blood received in culture bottles   Culture   Final    NO GROWTH 3 DAYS Performed at [facility], [address], [place], [place] [address]    Report Status PENDING  Incomplete     Radiology Studies: ECHOCARDIOGRAM COMPLETE Result Date: 11/30/2023    ECHOCARDIOGRAM REPORT   Patient Name:   KELIE GAINEY Date of Exam: 11/30/2023 Medical Rec #:  [id number]     Height:       60.0 in Accession #:    [id number]    Weight:        154.5 lb Date of Birth:  [birth year]     BSA:          1.673 m Patient Age:    58 years      BP:           139/59 mmHg Patient Gender: F             HR:           60 bpm. Exam Location:  Inpatient Procedure: 2D Echo, Cardiac Doppler and Color Doppler (Both Spectral and Color            Flow Doppler were utilized during procedure). Indications:    Stroke I63.9  History:  Patient has prior history of Echocardiogram examinations, most                 recent 05/29/2020. Risk Factors:Hypertension and Diabetes.  Sonographer:    Kip Peon RDCS Referring Phys: [id number] Faelyn Sigler  Sonographer Comments: Image acquisition challenging due to uncooperative patient. IMPRESSIONS  1. Mild intracavitary gradient. Left ventricular ejection fraction, by estimation, is 60 to 65%. The left ventricle has normal function. The left ventricle has no regional wall motion abnormalities. Left ventricular diastolic parameters are consistent with Grade I diastolic dysfunction (impaired relaxation).  2. Right ventricular systolic function is normal. The right ventricular size is normal. There is normal pulmonary artery systolic pressure.  3. The mitral valve is normal in structure. Trivial mitral valve regurgitation. No evidence of mitral stenosis. Moderate mitral annular calcification.  4. The aortic valve is tricuspid. There is mild calcification of the aortic valve. There is mild thickening of the aortic valve. Aortic valve regurgitation is not visualized. No aortic stenosis is present.  5. The inferior vena cava is normal in size with greater than 50% respiratory variability, suggesting right atrial pressure of 3 mmHg. FINDINGS  Left Ventricle: Mild intracavitary gradient. Left ventricular ejection fraction, by estimation, is 60 to 65%. The left ventricle has normal function. The left ventricle has no regional wall motion abnormalities. The left ventricular internal cavity size  was normal in size. There is no left ventricular  hypertrophy. Left ventricular diastolic parameters are consistent with Grade I diastolic dysfunction (impaired relaxation). Indeterminate filling pressures. Right Ventricle: The right ventricular size is normal. No increase in right ventricular wall thickness. Right ventricular systolic function is normal. There is normal pulmonary artery systolic pressure. The tricuspid regurgitant velocity is 2.31 m/s, and  with an assumed right atrial pressure of 3 mmHg, the estimated right ventricular systolic pressure is 24.3 mmHg. Left Atrium: Left atrial size was normal in size. Right Atrium: Right atrial size was normal in size. Pericardium: There is no evidence of pericardial effusion. Mitral Valve: The mitral valve is normal in structure. Moderate mitral annular calcification. Trivial mitral valve regurgitation. No evidence of mitral valve stenosis. Tricuspid Valve: The tricuspid valve is normal in structure. Tricuspid valve regurgitation is trivial. No evidence of tricuspid stenosis. Aortic Valve: The aortic valve is tricuspid. There is mild calcification of the aortic valve. There is mild thickening of the aortic valve. Aortic valve regurgitation is not visualized. No aortic stenosis is present. Pulmonic Valve: The pulmonic valve was normal in structure. Pulmonic valve regurgitation is not visualized. No evidence of pulmonic stenosis. Aorta: The aortic root is normal in size and structure. Venous: The inferior vena cava is normal in size with greater than 50% respiratory variability, suggesting right atrial pressure of 3 mmHg. IAS/Shunts: No atrial level shunt detected by color flow Doppler.  LEFT VENTRICLE PLAX 2D LVIDd:         4.20 cm   Diastology LVIDs:         2.50 cm   LV e' medial:    5.55 cm/s LV PW:         1.00 cm   LV E/e' medial:  13.3 LV IVS:        1.00 cm   LV e' lateral:   9.79 cm/s LVOT diam:     1.70 cm   LV E/e' lateral: 7.5 LV SV:         68 LV SV Index:   41 LVOT Area:     2.27 cm  RIGHT VENTRICLE              IVC RV Basal diam:  3.10 cm     IVC diam: 1.40 cm RV S prime:     14.90 cm/s TAPSE (M-mode): 2.2 cm LEFT ATRIUM             Index        RIGHT ATRIUM           Index LA diam:        2.70 cm 1.61 cm/m   RA Area:     12.00 cm LA Vol (A2C):   19.6 ml 11.72 ml/m  RA Volume:   25.80 ml  15.42 ml/m LA Vol (A4C):   25.3 ml 15.12 ml/m LA Biplane Vol: 24.2 ml 14.47 ml/m  AORTIC VALVE LVOT Vmax:   110.00 cm/s LVOT Vmean:  78.600 cm/s LVOT VTI:    0.300 m  AORTA Ao Root diam: 2.40 cm Ao Asc diam:  2.80 cm MITRAL VALVE               TRICUSPID VALVE MV Area (PHT): 2.66 cm    TR Peak grad:   21.3 mmHg MV Decel Time: 285 msec    TR Vmax:        231.00 cm/s MV E velocity: 73.90 cm/s MV A velocity: 98.50 cm/s  SHUNTS MV E/A ratio:  0.75        Systemic VTI:  0.30 m                            Systemic Diam: 1.70 cm Maudine Sos MD Electronically signed by Maudine Sos MD Signature Date/Time: 11/30/2023/2:42:32 PM    Final    CT ANGIO HEAD NECK W WO CM Result Date: 11/29/2023 CLINICAL DATA:  Stroke follow-up EXAM: CT ANGIOGRAPHY HEAD AND NECK WITH AND WITHOUT CONTRAST TECHNIQUE: Multidetector CT imaging of the head and neck was performed using the standard protocol during bolus administration of intravenous contrast. Multiplanar CT image reconstructions and MIPs were obtained to evaluate the vascular anatomy. Carotid stenosis measurements (when applicable) are obtained utilizing NASCET criteria, using the distal internal carotid diameter as the denominator. RADIATION DOSE REDUCTION: This exam was performed according to the departmental dose-optimization program which includes automated exposure control, adjustment of the mA and/or kV according to patient size and/or use of iterative reconstruction technique. CONTRAST:  75mL OMNIPAQUE  IOHEXOL  350 MG/ML SOLN COMPARISON:  11/28/2023 brain MRI FINDINGS: CT HEAD FINDINGS Brain: There is no mass, hemorrhage or extra-axial collection. The size and configuration of  the ventricles and extra-axial CSF spaces are normal. There is hypoattenuation of the white matter, most commonly indicating chronic small vessel disease. Vascular: No hyperdense vessel or unexpected vascular calcification. Skull: The visualized skull base, calvarium and extracranial soft tissues are normal. Sinuses/Orbits: No fluid levels or advanced mucosal thickening of the visualized paranasal sinuses. No mastoid or middle ear effusion. Normal orbits. CTA NECK FINDINGS Skeleton: No acute abnormality or high grade bony spinal canal stenosis. Other neck: Normal pharynx, larynx and major salivary glands. No cervical lymphadenopathy. Unremarkable thyroid  gland. Upper chest: No pneumothorax or pleural effusion. No nodules or masses. Aortic arch: There is calcific atherosclerosis of the aortic arch. Conventional 3 vessel aortic branching pattern. RIGHT carotid system: No dissection, occlusion or aneurysm. Mild atherosclerotic calcification at the carotid bifurcation without hemodynamically significant stenosis. LEFT carotid system: No dissection, occlusion or aneurysm. There is mixed density atherosclerosis extending into the  proximal ICA, resulting in less than 50% stenosis. Vertebral arteries: Codominant configuration. There is no dissection, occlusion or flow-limiting stenosis to the skull base (V1-V3 segments). CTA HEAD FINDINGS POSTERIOR CIRCULATION: Vertebral arteries are normal. No proximal occlusion of the anterior or inferior cerebellar arteries. Basilar artery is normal. Superior cerebellar arteries are normal. Posterior cerebral arteries are normal. ANTERIOR CIRCULATION: Bilateral intracranial ICA atherosclerosis with moderate stenosis of the supraclinoid left ICA. Anterior cerebral arteries are normal. Middle cerebral arteries are normal. Venous sinuses: As permitted by contrast timing, patent. Anatomic variants: The right A1 segment of the ACA is absent. Both posterior communicating arteries are patent.  Review of the MIP images confirms the above findings. IMPRESSION: 1. No emergent large vessel occlusion. 2. 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 3. Bilateral carotid bifurcation atherosclerosis without hemodynamically significant stenosis. Aortic Atherosclerosis (ICD10-I70.0). Electronically Signed   By: Juanetta Nordmann M.D.   On: 11/29/2023 20:13     Scheduled Meds:  sodium chloride    Intravenous Once   sodium chloride    Intravenous Once   sodium chloride    Intravenous Once   amLODipine   5 mg Oral QPM   apixaban  5 mg Oral BID   vitamin C  1,000 mg Oral Daily   atorvastatin   20 mg Oral Daily   calcitRIOL  0.25 mcg Oral Q T,Th,Sa-HD   calcium  carbonate  1 tablet Oral TID WC   Chlorhexidine  Gluconate Cloth  6 each Topical Daily   Chlorhexidine  Gluconate Cloth  6 each Topical Q0600   Chlorhexidine  Gluconate Cloth  6 each Topical Q0600   cloNIDine   0.1 mg Transdermal Weekly   insulin  aspart  0-5 Units Subcutaneous QHS   insulin  aspart  0-6 Units Subcutaneous TID WC   metoprolol tartrate  50 mg Oral BID   nutrition supplement (JUVEN)  1 packet Oral BID BM   sodium chloride  flush  10-40 mL Intracatheter Q12H   thiamine  100 mg Oral Daily   zinc sulfate (50mg  elemental zinc)  220 mg Oral Daily   Continuous Infusions:  sodium chloride        LOS: 10 days   Modena Andes, MD [facility]  12/01/2023, 12:29 PM   *Please note that this is a verbal dictation therefore any spelling or grammatical errors are due to the "Dragon Medical One" system interpretation.  Please page via Amion and do not message via secure chat for urgent patient care matters. Secure chat can be used for non urgent patient care matters.  How to contact the [facility] Attending or Consulting provider 7A - 7P or covering provider during after hours 7P -7A, for this patient?  Check the care team in [facility] and look for a) attending/consulting [facility] provider listed and b) the [facility] team listed. Page or secure chat  7A-7P. Log into [email or web address] and use [facility]'s universal password to access. If you do not have the password, please contact the hospital operator. Locate the [facility] provider you are looking for under [facility] and page to a number that you can be directly reached. If you still have difficulty reaching the provider, please page the [facility] (Director on Call) for the Hospitalists listed on amion for assistance.

## 2023-12-01 NOTE — Progress Notes (Signed)
 [facility] Progress Note   Subjective:    Pt seen in room Lethargic, not in distress  Objective Vitals:   12/01/23 1000 12/01/23 1030 12/01/23 1100 12/01/23 1215  BP: (!) 103/58 119/61 91/64 138/63  Pulse: 64 60 61 60  Resp: 13 (!) 9 (!) 8 10  Temp:    97.6 F (36.4 C)  TempSrc:    Oral  SpO2: 100% 100% 100% 100%  Weight:    68.1 kg  Height:       Physical Exam General: sleepy female, on RA Heart: RRR, no murmurs, rubs or gallops Lungs: CTA bilaterally, respirations unlabored  Abdomen: Soft, non-distended, +BS Extremities: No edema b/l lower extremities Dialysis Access: internal jugular TDC  Dialysis Orders: GO TTS  3h  B350   64kg   TDC  Heparin  5000 Mircera 225 mcg q 2 weeks. 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 Due to infected L foot non-healing. Abx dc'd.  ESRD: HD TTS. HD today.  HTN: bp's are good, pt on catapres  patch, norvasc  and metoprolol  here as at home. Home hydralazine  on hold.  Volume: note that her EDW will need to decrease post-BKA. Euvolemic on exam, no UF w/ HD.  Anemia of CKD: getting max ESA here as at OP unit. SP 2u prbc 5/25 for Hb 6.0.  follow.  Metabolic bone disease: hypophosphatemia, now improved. Holding binder. On calcitriol .  Encephalopathy: neuro and psych on board, concern for multifocal stroke. 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 Also possible depression/ catatonia. Per neuro/ pmd.  DVT: may consider resuming eliquis  for this once Hb stable  Rob Dunya Meiners  MD  [facility] 12/01/2023, 1:16 PM  Recent Labs  Lab 11/27/23 1105 11/28/23 1224 11/30/23 1038 12/01/23 0830  HGB 6.0*   < > 12.6 10.4*  ALBUMIN  2.0*  --   --  2.0*  CALCIUM  7.4*  --   --  7.7*  PHOS 3.5  --   --  2.6  CREATININE 3.88*  --   --  3.28*  K 4.4  --   --  4.0   < > = values in this interval not displayed.    Inpatient medications:  sodium chloride    Intravenous Once   sodium chloride     Intravenous Once   sodium chloride    Intravenous Once   amLODipine   5 mg Oral QPM   apixaban   5 mg Oral BID   vitamin C   1,000 mg Oral Daily   atorvastatin   20 mg Oral Daily   calcitRIOL   0.25 mcg Oral Q T,Th,Sa-HD   calcium  carbonate  1 tablet Oral TID WC   Chlorhexidine  Gluconate Cloth  6 each Topical Daily   Chlorhexidine  Gluconate Cloth  6 each Topical Q0600   Chlorhexidine  Gluconate Cloth  6 each Topical Q0600   cloNIDine   0.1 mg Transdermal Weekly   insulin  aspart  0-5 Units Subcutaneous QHS   insulin  aspart  0-6 Units Subcutaneous TID WC   metoprolol  tartrate  50 mg Oral BID   nutrition supplement (JUVEN)  1 packet Oral BID BM   sodium chloride  flush  10-40 mL Intracatheter Q12H   thiamine   100 mg Oral Daily   zinc  sulfate (50mg  elemental zinc )  220 mg Oral Daily    sodium chloride      acetaminophen  **OR** acetaminophen , calcium  carbonate (dosed in mg elemental calcium ), camphor-menthol  **AND** hydrOXYzine , docusate sodium , feeding supplement (NEPRO CARB STEADY), hydrALAZINE , HYDROmorphone  (DILAUDID ) injection, LORazepam , naLOXone  (  NARCAN )  injection, ondansetron  **OR** ondansetron  (ZOFRAN ) IV, oxyCODONE , oxyCODONE , prochlorperazine , sodium chloride  flush, sorbitol 

## 2023-12-02 ENCOUNTER — Inpatient Hospital Stay (HOSPITAL_COMMUNITY)
Admission: RE | Admit: 2023-12-02 | Discharge: 2023-12-02 | Disposition: A | Discharge status: Home / Self Care | Source: Ambulatory Visit | Attending: Nephrology | Admitting: Nephrology

## 2023-12-02 DIAGNOSIS — E1122 Type 2 diabetes mellitus with diabetic chronic kidney disease: Secondary | ICD-10-CM | POA: Diagnosis not present

## 2023-12-02 DIAGNOSIS — Z515 Encounter for palliative care: Secondary | ICD-10-CM

## 2023-12-02 DIAGNOSIS — L03116 Cellulitis of left lower limb: Secondary | ICD-10-CM | POA: Diagnosis not present

## 2023-12-02 DIAGNOSIS — M869 Osteomyelitis, unspecified: Secondary | ICD-10-CM | POA: Diagnosis not present

## 2023-12-02 DIAGNOSIS — I639 Cerebral infarction, unspecified: Secondary | ICD-10-CM | POA: Diagnosis not present

## 2023-12-02 LAB — CBC
HCT: 39.9 % (ref 36.0–46.0)
Hemoglobin: 12.8 g/dL (ref 12.0–15.0)
MCH: 24.5 pg — ABNORMAL LOW (ref 26.0–34.0)
MCHC: 32.1 g/dL (ref 30.0–36.0)
MCV: 76.3 fL — ABNORMAL LOW (ref 80.0–100.0)
Platelets: 330 10*3/uL (ref 150–400)
RBC: 5.23 MIL/uL — ABNORMAL HIGH (ref 3.87–5.11)
RDW: 29.1 % — ABNORMAL HIGH (ref 11.5–15.5)
WBC: 11.7 10*3/uL — ABNORMAL HIGH (ref 4.0–10.5)
nRBC: 0 % (ref 0.0–0.2)

## 2023-12-02 LAB — BASIC METABOLIC PANEL WITH GFR
Anion gap: 15 (ref 5–15)
BUN: 27 mg/dL — ABNORMAL HIGH (ref 6–20)
CO2: 27 mmol/L (ref 22–32)
Calcium: 8.5 mg/dL — ABNORMAL LOW (ref 8.9–10.3)
Chloride: 92 mmol/L — ABNORMAL LOW (ref 98–111)
Creatinine, Ser: 3.04 mg/dL — ABNORMAL HIGH (ref 0.44–1.00)
GFR, Estimated: 17 mL/min — ABNORMAL LOW (ref >60)
Glucose, Bld: 91 mg/dL (ref 70–99)
Potassium: 3.8 mmol/L (ref 3.5–5.1)
Sodium: 134 mmol/L — ABNORMAL LOW (ref 135–145)

## 2023-12-02 LAB — GLUCOSE, CAPILLARY
Glucose-Capillary: 191 mg/dL — ABNORMAL HIGH (ref 70–99)
Glucose-Capillary: 119 mg/dL — ABNORMAL HIGH (ref 70–99)
Glucose-Capillary: 104 mg/dL — ABNORMAL HIGH (ref 70–99)
Glucose-Capillary: 116 mg/dL — ABNORMAL HIGH (ref 70–99)

## 2023-12-02 MED ORDER — CHLORHEXIDINE GLUCONATE CLOTH 2 % EX PADS
6.0000 | MEDICATED_PAD | Freq: Every day | CUTANEOUS | Status: DC
  Administered 2023-12-03 – 2023-12-06 (×4): 6 via TOPICAL

## 2023-12-02 NOTE — Plan of Care (Signed)

## 2023-12-02 NOTE — TOC Progression Note (Signed)
 Transition of Care [facility]) - Progression Note    Patient Details  Name: RYLYN ZAWISTOWSKI MRN: [id number] Date of Birth: 1966-02-26  Transition of Care [facility]) CM/SW Contact  Tandy Fam, [place] Phone Number: 12/02/2023, 3:13 PM  Clinical Narrative:   CSW following for disposition. Noting therapy has signed off, patient to return to LTC when stable. CSW updated [facility], they are able to accept when stable. CSW to follow.    Expected Discharge Plan: Skilled Nursing Facility Barriers to Discharge: Continued Medical Work up  Expected Discharge Plan and Services       Living arrangements for the past 2 months: Skilled Nursing Facility                                       Social Determinants of Health (SDOH) Interventions SDOH Screenings   Food Insecurity: No Food Insecurity (11/21/2023)  Housing: Unknown (11/21/2023)  Transportation Needs: No Transportation Needs (11/21/2023)  Utilities: Not At Risk (11/21/2023)  Social Connections: Unknown (11/15/2021)   Received from [facility], [facility]  Tobacco Use: Low Risk  (11/25/2023)    Readmission Risk Interventions     No data to display

## 2023-12-02 NOTE — Progress Notes (Signed)
 Palliative Care Progress Note, Assessment & Plan   Patient Name: [name] [name]       Date: 12/02/2023 DOB: [birth year]  Age: 58 y.o. MRN#: [id number] Attending Physician: Danette Duos, MD Primary Care Physician: Alwyn Juba., MD Admit Date: 11/21/2023  Subjective: Patient sitting up in bed, awake and alert.  She acknowledges my presence and is able to make her wishes known.  No family or friends present during my visit.  HPI: 58 y.o. female  with past medical history of end-stage renal disease on hemodialysis, GERD, essential hypertension, left eye blindness, diabetes type 2, history of lumbar radiculopathy, admitted on 11/21/2023 with left heel wound and suspicion for possible gangrene.    Patient initially admitted with foot ulcer infection and cellulitis, symptomatic anemia. MRI then showed osteomyelitis and after initially refusing BKA, patient agreed. She also has refused labs, vital signs, medications and personal care.   PMT has been re-consulted to assist with goals of care conversation.  Summary of counseling/coordination of care: Extensive chart review completed prior to meeting patient including labs, vital signs, imaging, progress notes, orders, and available advanced directive documents from current and previous encounters.   After reviewing the patient's chart and assessing the patient at bedside, I spoke with patient in regards to symptom management and goals of care.   Patient endorses that the pain and discomfort in her left lower extremity is significantly improved today.  She does not have complaints of hunger or being cold as she did yesterday.  She has minor complaints of right arm tightness.  Upon assessment, bicep feels like it has a minor spasm in it.  No pain with movement and  full range of motion but intact.  Lidocaine  patch recommended to right arm for comfort.  Encouraged mobility and exercises for muscle tightness.  I again discussed boundaries and goals of care with patient.  She continues to endorse that she does not want me to speak with her husband or her family about her current medical condition at this time.  She says she is able to make her own decisions.  Full code and full scope remain.  Goals are clear.  Symptom burden is low.  PMT will continue to follow but monitor her peripherally and reengage when appropriate.  Please reach out to PMT should goals change, at patient/family's request, or if patient's health deteriorates during hospitalization.  Physical Exam Vitals reviewed.  Constitutional:      General: She is not in acute distress.    Appearance: She is normal weight.  HENT:     Head: Normocephalic.     Mouth/Throat:     Mouth: Mucous membranes are moist.  Eyes:     Pupils: Pupils are equal, round, and reactive to light.  Pulmonary:     Effort: Pulmonary effort is normal.  Abdominal:     Palpations: Abdomen is soft.  Musculoskeletal:     Comments: Right bicep tightness - full ROM in RUE  Skin:    General: Skin is warm and dry.  Neurological:     Mental Status: She is alert and oriented to person, place, and time.  Psychiatric:        Mood and  Affect: Mood normal.        Behavior: Behavior normal.        Thought Content: Thought content normal.        Judgment: Judgment normal.             Total Time 35 minutes   Time spent includes: Detailed review of medical records (labs, imaging, vital signs), medically appropriate exam (mental status, respiratory, cardiac, skin), discussed with treatment team, counseling and educating patient, family and staff, documenting clinical information, medication management and coordination of care.  Judeen Nose L. Rebbeca Campi, DNP, FNP-BC Palliative Medicine Team

## 2023-12-02 NOTE — Progress Notes (Signed)
 [facility] Progress Note   Subjective:    Pt seen in room Lethargic, not in distress  Objective Vitals:   12/02/23 0020 12/02/23 0415 12/02/23 0834 12/02/23 1134  BP: 136/68 137/68 131/71 (!) 151/74  Pulse: 71 (!) 59 (!) 57 65  Resp: 15 18 17 18   Temp: 98.4 F (36.9 C) (!) 97.5 F (36.4 C) 97.9 F (36.6 C) 98 F (36.7 C)  TempSrc: Oral Oral Oral Oral  SpO2: 100% 100% 100% 100%  Weight:  59.4 kg    Height:       Physical Exam General: sleepy female, on RA, awakens and followed simple commands Heart: RRR, no murmurs, rubs or gallops Lungs: CTA bilaterally, respirations unlabored  Abdomen: Soft, non-distended, +BS Extremities: No edema b/l lower extremities Dialysis Access: internal jugular TDC  Dialysis Orders: GO TTS  3h  B350   64kg   TDC  Heparin  5000 Mircera 225 mcg q 2 weeks. 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 Next HD Sat.  HTN: bp's are good on catapres  patch, norvasc  and metoprolol here as at home. Home hydralazine  on hold.  Volume: note that her EDW will need to decrease post-BKA. Euvolemic on exam, no UF w/ HD.  Anemia of CKD: getting max ESA here as at OP unit. SP 2u prbc 5/25 for Hb 6.0.  follow.  Metabolic bone disease: hypophosphatemia, now improved. Holding binder. On calcitriol.  Encephalopathy: MRI with small bilateral watershed infarct pattern/ stroke pattern. Per neuro/ pmd.  DVT: may consider resuming eliquis for this once Hb stable  Larry Poag  MD  [facility] 12/02/2023, 12:32 PM  Recent Labs  Lab 11/27/23 1105 11/28/23 1224 11/30/23 1038 12/01/23 0830  HGB 6.0*   < > 12.6 10.4*  ALBUMIN  2.0*  --   --  2.0*  CALCIUM  7.4*  --   --  7.7*  PHOS 3.5  --   --  2.6  CREATININE 3.88*  --   --  3.28*  K 4.4  --   --  4.0   < > = values in this interval not displayed.    Inpatient medications:  sodium chloride    Intravenous Once   sodium  chloride   Intravenous Once   sodium chloride    Intravenous Once   amLODipine   5 mg Oral QPM   apixaban  5 mg Oral BID   vitamin C  1,000 mg Oral Daily   atorvastatin   20 mg Oral Daily   calcitRIOL  0.25 mcg Oral Q T,Th,Sa-HD   calcium  carbonate  1 tablet Oral TID WC   Chlorhexidine  Gluconate Cloth  6 each Topical Daily   Chlorhexidine  Gluconate Cloth  6 each Topical Q0600   Chlorhexidine  Gluconate Cloth  6 each Topical Q0600   cloNIDine   0.1 mg Transdermal Weekly   insulin  aspart  0-5 Units Subcutaneous QHS   insulin  aspart  0-6 Units Subcutaneous TID WC   metoprolol tartrate  50 mg Oral BID   nutrition supplement (JUVEN)  1 packet Oral BID BM   sodium chloride  flush  10-40 mL Intracatheter Q12H   thiamine  100 mg Oral Daily   zinc sulfate (50mg  elemental zinc)  220 mg Oral Daily    sodium chloride      acetaminophen  **OR** acetaminophen , calcium  carbonate (dosed in mg elemental calcium ), camphor-menthol  **AND** hydrOXYzine , docusate sodium , feeding supplement (NEPRO CARB STEADY), hydrALAZINE , HYDROmorphone  (DILAUDID ) injection, LORazepam, naLOXone  (  NARCAN )  injection, ondansetron  **OR** ondansetron  (ZOFRAN ) IV, oxyCODONE , oxyCODONE , prochlorperazine , sodium chloride  flush, sorbitol 

## 2023-12-02 NOTE — Progress Notes (Signed)
 PROGRESS NOTE    JOEE IOVINE  NWG:[id number] DOB: Jun 08, 1966 DOA: 11/21/2023 PCP: Alwyn Juba., MD   Brief Narrative: 58 year old African-American female with past medical history significant for ESRD on hemodialysis TTS, GERD, hypertension, left eye blindness, diabetes type 2 history of lumbar radiculopathy presents from SNF from [place] [facility] with left heel wound and cellulitis and suspicious for possible gas-forming infection.  She had been undergoing wound care treatment but did state that the foot has gotten worse.  Workup in the ED showed wound infection and cellulitis and concern for deep infection.  MRI confirm osteomyelitis.  Dr. 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  Hospital course complicated by multifocal stroke, depression catatonia Anemia presented with a hemoglobin of 6    Assessment & Plan:   Principal Problem:   Cellulitis of left lower extremity Active Problems:   Diabetic neuropathy (HCC)   Essential hypertension   GERD (gastroesophageal reflux disease)   ESRD (end stage renal disease) (HCC)   DM2 (diabetes mellitus, type 2) (HCC)   Anemia due to chronic kidney disease   Osteomyelitis of left foot (HCC)   1-Infected Diabetic Foot ulcer with Cellulitis/gangrene and gas-forming infection of the left heel/osteomyelitis:  -Patient presented with unstageable pressure heel left ulcer with a wound bed black eschar.  Followed by the wound clinic, but was not getting better. - Blood cultures no growth today. - MRI confirm osteomyelitis. - Dr. Julio Ohm consulted and recommended left BKA which patient agreed after lengthy discussion. - Underwent left BKA by Dr. Julio Ohm 11/25/2023, still has wound VAC in place - Completed antibiotics 11/28/2023   Multifocal stroke: - Post surgery, patient had intermittent somnolence and not having proper conversation.  It was thought that patient was severely  depressed, appear patient was making some effort with she could not talk, could not follow commands.  Stroke was suspected and MRI was obtained which confirmed multifocal ischemic stroke. - Neurology consulted, CTA head and neck negative for large vessel occlusion.  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 - Back on Eliquis since 5/28. - LDL 93, continue atorvastatin . - Patient mental status improving.  Need to avoid hypotension.  Depression/catatonia - Patient with unwillingness or inability to talk or refusal of lab and medical care, psychiatry was consulted and at some point it was suspected that she had some catatonia, she received Ativan and her symptoms improve. Have some intermittent symptoms. Ativan as needed.  Patient appears improving.  Diabetes type 2 Diabetic neuropathy retinopathy - Continue sliding scale insulin .  Type she has been refusing insulin   Hyponatremia - Mild, correction with hemodialysis  Hypertension - Continue Norvasc , metoprolol and Klonopin patch  ESRD on hemodialysis TTS - Continue with hemodialysis  Symptomatic anemia/anemia of chronic kidney disease - Hemoglobin on admission was 6.1, received 1 unit of packed red blood cell on admission.  Subsequently hemoglobin dropped again and received 2 more units of packed red blood cell. Continue to monitor hemoglobin   Thrombocytosis - Improved  GOC: Palliative care following   See wound care documentation below Pressure Injury 11/28/23 Sacrum Stage 3 -  Full thickness tissue loss. Subcutaneous fat may be visible but bone, tendon or muscle are NOT exposed. (Active)  11/28/23 0931  Location: Sacrum  Location Orientation:   Staging: Stage 3 -  Full thickness tissue loss. Subcutaneous fat may be visible but bone, tendon or muscle are NOT exposed.  Wound Description (Comments):   Present  on Admission:   Dressing Type Foam - Lift dressing to assess site every shift 12/02/23 0500     Estimated  body mass index is 25.58 kg/m as calculated from the following:   Height as of this encounter: 5' (1.524 m).   Weight as of this encounter: 59.4 kg.   DVT prophylaxis: Eliquis Code Status: Full code Family Communication: Care discussed with patient Disposition Plan:  Status is: Inpatient Remains inpatient appropriate because: Management of stroke , foot infection    Consultants:  Neurology Psychiatrist Nephrology  Procedures:  BKA  Antimicrobials:    Subjective: Patient is alert, she is on the phone talking with her husband.  She says that she is willing to eat more and she was able to answer questions.   Objective: Vitals:   12/01/23 1525 12/01/23 2040 12/02/23 0020 12/02/23 0415  BP: (!) 124/55 125/66 136/68 137/68  Pulse: 69 60 71 (!) 59  Resp: 16  15 18   Temp: 98.7 F (37.1 C) 98.3 F (36.8 C) 98.4 F (36.9 C) (!) 97.5 F (36.4 C)  TempSrc: Oral Axillary Oral Oral  SpO2: 97% 100% 100% 100%  Weight:    59.4 kg  Height:        Intake/Output Summary (Last 24 hours) at 12/02/2023 0842 Last data filed at 12/01/2023 1215 Gross per 24 hour  Intake --  Output 2000 ml  Net -2000 ml   Filed Weights   11/30/23 0500 12/01/23 1215 12/02/23 0415  Weight: 70.1 kg 68.1 kg 59.4 kg    Examination:  General exam: Appears calm and comfortable  Respiratory system: Clear to auscultation. Respiratory effort normal. Cardiovascular system: S1 & S2 heard, RRR. No JVD, murmurs, rubs, gallops or clicks. No pedal edema. Gastrointestinal system: Abdomen is nondistended, soft and nontender. No organomegaly or masses felt. Normal bowel sounds heard. Central nervous system: Alert and oriented.  Extremities: Status post left BKA wound VAC in place   Data Reviewed: I have personally reviewed following labs and imaging studies  CBC: Recent Labs  Lab 11/27/23 1105 11/28/23 1224 11/29/23 0520 11/30/23 1038 12/01/23 0830  WBC 15.0* 17.4* 14.0* 12.9* 12.9*  NEUTROABS  --   14.8* 10.7* 10.2*  --   HGB 6.0* 11.8* 12.6 12.6 10.4*  HCT 20.2* 35.1* 38.9 39.8 33.3*  MCV 65.4* 74.1* 75.5* 76.2* 76.6*  PLT 356 320 347 347 388   Basic Metabolic Panel: Recent Labs  Lab 11/27/23 1105 12/01/23 0830  NA 132* 136  K 4.4 4.0  CL 95* 98  CO2 22 24  GLUCOSE 80 82  BUN 26* 26*  CREATININE 3.88* 3.28*  CALCIUM  7.4* 7.7*  PHOS 3.5 2.6   GFR: Estimated Creatinine Clearance: 15.3 mL/min (A) (by C-G formula based on SCr of 3.28 mg/dL (H)). Liver Function Tests: Recent Labs  Lab 11/27/23 1105 12/01/23 0830  ALBUMIN  2.0* 2.0*   No results for input(s): "LIPASE", "AMYLASE" in the last 168 hours. Recent Labs  Lab 11/28/23 1846  AMMONIA 13   Coagulation Profile: No results for input(s): "INR", "PROTIME" in the last 168 hours. Cardiac Enzymes: No results for input(s): "CKTOTAL", "CKMB", "CKMBINDEX", "TROPONINI" in the last 168 hours. BNP (last 3 results) No results for input(s): "PROBNP" in the last 8760 hours. HbA1C: No results for input(s): "HGBA1C" in the last 72 hours. CBG: Recent Labs  Lab 12/01/23 1308 12/01/23 1336 12/01/23 1641 12/01/23 2105 12/02/23 0622  GLUCAP 69* 88 87 150* 191*   Lipid Profile: No results for input(s): "CHOL", "HDL", "LDLCALC", "  TRIG", "CHOLHDL", "LDLDIRECT" in the last 72 hours. Thyroid  Function Tests: No results for input(s): "TSH", "T4TOTAL", "FREET4", "T3FREE", "THYROIDAB" in the last 72 hours. Anemia Panel: No results for input(s): "VITAMINB12", "FOLATE", "FERRITIN", "TIBC", "IRON ", "RETICCTPCT" in the last 72 hours. Sepsis Labs: No results for input(s): "PROCALCITON", "LATICACIDVEN" in the last 168 hours.  Recent Results (from the past 240 hours)  Surgical pcr screen     Status: None   Collection Time: 11/25/23 11:31 AM   Specimen: Nasal Mucosa; Nasal Swab  Result Value Ref Range Status   MRSA, PCR NEGATIVE NEGATIVE Final   Staphylococcus aureus NEGATIVE NEGATIVE Final    Comment: (NOTE) The Xpert SA Assay  (FDA approved for NASAL specimens in patients 79 years of age and older), is one component of a comprehensive surveillance program. It is not intended to diagnose infection nor to guide or monitor treatment. Performed at [facility], [address], [place], [place] [address]   Culture, blood (Routine X 2) w Reflex to ID Panel     Status: None (Preliminary result)   Collection Time: 11/28/23  7:18 PM   Specimen: BLOOD LEFT HAND  Result Value Ref Range Status   Specimen Description BLOOD LEFT HAND  Final   Special Requests   Final    BOTTLES DRAWN AEROBIC ONLY Blood Culture results may not be optimal due to an inadequate volume of blood received in culture bottles   Culture   Final    NO GROWTH 4 DAYS Performed at [facility], [address] [place], [place] [address]    Report Status PENDING  Incomplete  Culture, blood (Routine X 2) w Reflex to ID Panel     Status: None (Preliminary result)   Collection Time: 11/28/23  7:18 PM   Specimen: BLOOD LEFT HAND  Result Value Ref Range Status   Specimen Description BLOOD LEFT HAND  Final   Special Requests   Final    AEROBIC BOTTLE ONLY Blood Culture results may not be optimal due to an inadequate volume of blood received in culture bottles   Culture   Final    NO GROWTH 4 DAYS Performed at [facility], [address], [place], [place] [address]    Report Status PENDING  Incomplete         Radiology Studies: ECHOCARDIOGRAM COMPLETE Result Date: 11/30/2023    ECHOCARDIOGRAM REPORT   Patient Name:   [name] [name] Date of Exam: 11/30/2023 Medical Rec #:  [id number]     Height:       60.0 in Accession #:    [id number]    Weight:       154.5 lb Date of Birth:  October 03, 1965     BSA:          1.673 m Patient Age:    58 years      BP:           139/59 mmHg Patient Gender: F             HR:           60 bpm. Exam Location:  Inpatient Procedure: 2D Echo, Cardiac Doppler and Color Doppler (Both Spectral and Color            Flow  Doppler were utilized during procedure). Indications:    Stroke I63.9  History:        Patient has prior history of Echocardiogram examinations, most  recent 05/29/2020. Risk Factors:Hypertension and Diabetes.  Sonographer:    Kip Peon RDCS Referring Phys: [id number] RAVI PAHWANI  Sonographer Comments: Image acquisition challenging due to uncooperative patient. IMPRESSIONS  1. Mild intracavitary gradient. Left ventricular ejection fraction, by estimation, is 60 to 65%. The left ventricle has normal function. The left ventricle has no regional wall motion abnormalities. Left ventricular diastolic parameters are consistent with Grade I diastolic dysfunction (impaired relaxation).  2. Right ventricular systolic function is normal. The right ventricular size is normal. There is normal pulmonary artery systolic pressure.  3. The mitral valve is normal in structure. Trivial mitral valve regurgitation. No evidence of mitral stenosis. Moderate mitral annular calcification.  4. The aortic valve is tricuspid. There is mild calcification of the aortic valve. There is mild thickening of the aortic valve. Aortic valve regurgitation is not visualized. No aortic stenosis is present.  5. The inferior vena cava is normal in size with greater than 50% respiratory variability, suggesting right atrial pressure of 3 mmHg. FINDINGS  Left Ventricle: Mild intracavitary gradient. Left ventricular ejection fraction, by estimation, is 60 to 65%. The left ventricle has normal function. The left ventricle has no regional wall motion abnormalities. The left ventricular internal cavity size  was normal in size. There is no left ventricular hypertrophy. Left ventricular diastolic parameters are consistent with Grade I diastolic dysfunction (impaired relaxation). Indeterminate filling pressures. Right Ventricle: The right ventricular size is normal. No increase in right ventricular wall thickness. Right ventricular systolic  function is normal. There is normal pulmonary artery systolic pressure. The tricuspid regurgitant velocity is 2.31 m/s, and  with an assumed right atrial pressure of 3 mmHg, the estimated right ventricular systolic pressure is 24.3 mmHg. Left Atrium: Left atrial size was normal in size. Right Atrium: Right atrial size was normal in size. Pericardium: There is no evidence of pericardial effusion. Mitral Valve: The mitral valve is normal in structure. Moderate mitral annular calcification. Trivial mitral valve regurgitation. No evidence of mitral valve stenosis. Tricuspid Valve: The tricuspid valve is normal in structure. Tricuspid valve regurgitation is trivial. No evidence of tricuspid stenosis. Aortic Valve: The aortic valve is tricuspid. There is mild calcification of the aortic valve. There is mild thickening of the aortic valve. Aortic valve regurgitation is not visualized. No aortic stenosis is present. Pulmonic Valve: The pulmonic valve was normal in structure. Pulmonic valve regurgitation is not visualized. No evidence of pulmonic stenosis. Aorta: The aortic root is normal in size and structure. Venous: The inferior vena cava is normal in size with greater than 50% respiratory variability, suggesting right atrial pressure of 3 mmHg. IAS/Shunts: No atrial level shunt detected by color flow Doppler.  LEFT VENTRICLE PLAX 2D LVIDd:         4.20 cm   Diastology LVIDs:         2.50 cm   LV e' medial:    5.55 cm/s LV PW:         1.00 cm   LV E/e' medial:  13.3 LV IVS:        1.00 cm   LV e' lateral:   9.79 cm/s LVOT diam:     1.70 cm   LV E/e' lateral: 7.5 LV SV:         68 LV SV Index:   41 LVOT Area:     2.27 cm  RIGHT VENTRICLE             IVC RV Basal diam:  3.10 cm  IVC diam: 1.40 cm RV S prime:     14.90 cm/s TAPSE (M-mode): 2.2 cm LEFT ATRIUM             Index        RIGHT ATRIUM           Index LA diam:        2.70 cm 1.61 cm/m   RA Area:     12.00 cm LA Vol (A2C):   19.6 ml 11.72 ml/m  RA Volume:    25.80 ml  15.42 ml/m LA Vol (A4C):   25.3 ml 15.12 ml/m LA Biplane Vol: 24.2 ml 14.47 ml/m  AORTIC VALVE LVOT Vmax:   110.00 cm/s LVOT Vmean:  78.600 cm/s LVOT VTI:    0.300 m  AORTA Ao Root diam: 2.40 cm Ao Asc diam:  2.80 cm MITRAL VALVE               TRICUSPID VALVE MV Area (PHT): 2.66 cm    TR Peak grad:   21.3 mmHg MV Decel Time: 285 msec    TR Vmax:        231.00 cm/s MV E velocity: 73.90 cm/s MV A velocity: 98.50 cm/s  SHUNTS MV E/A ratio:  0.75        Systemic VTI:  0.30 m                            Systemic Diam: 1.70 cm Maudine Sos MD Electronically signed by Maudine Sos MD Signature Date/Time: 11/30/2023/2:42:32 PM    Final         Scheduled Meds:  sodium chloride    Intravenous Once   sodium chloride    Intravenous Once   sodium chloride    Intravenous Once   amLODipine   5 mg Oral QPM   apixaban  5 mg Oral BID   vitamin C  1,000 mg Oral Daily   atorvastatin   20 mg Oral Daily   calcitRIOL  0.25 mcg Oral Q T,Th,Sa-HD   calcium  carbonate  1 tablet Oral TID WC   Chlorhexidine  Gluconate Cloth  6 each Topical Daily   Chlorhexidine  Gluconate Cloth  6 each Topical Q0600   Chlorhexidine  Gluconate Cloth  6 each Topical Q0600   cloNIDine   0.1 mg Transdermal Weekly   insulin  aspart  0-5 Units Subcutaneous QHS   insulin  aspart  0-6 Units Subcutaneous TID WC   metoprolol tartrate  50 mg Oral BID   nutrition supplement (JUVEN)  1 packet Oral BID BM   sodium chloride  flush  10-40 mL Intracatheter Q12H   thiamine  100 mg Oral Daily   zinc sulfate (50mg  elemental zinc)  220 mg Oral Daily   Continuous Infusions:  sodium chloride        LOS: 11 days    Time spent: 35 minutes    Lavana Huckeba A Kaitlynn Tramontana, MD [facility]   If 7PM-7AM, please contact night-coverage [email or web address]  12/02/2023, 8:42 AM

## 2023-12-03 DIAGNOSIS — L03116 Cellulitis of left lower limb: Secondary | ICD-10-CM | POA: Diagnosis not present

## 2023-12-03 DIAGNOSIS — E1122 Type 2 diabetes mellitus with diabetic chronic kidney disease: Secondary | ICD-10-CM | POA: Diagnosis not present

## 2023-12-03 DIAGNOSIS — I639 Cerebral infarction, unspecified: Secondary | ICD-10-CM | POA: Diagnosis not present

## 2023-12-03 DIAGNOSIS — M869 Osteomyelitis, unspecified: Secondary | ICD-10-CM | POA: Diagnosis not present

## 2023-12-03 LAB — GLUCOSE, CAPILLARY
Glucose-Capillary: 105 mg/dL — ABNORMAL HIGH (ref 70–99)
Glucose-Capillary: 106 mg/dL — ABNORMAL HIGH (ref 70–99)
Glucose-Capillary: 108 mg/dL — ABNORMAL HIGH (ref 70–99)
Glucose-Capillary: 80 mg/dL (ref 70–99)

## 2023-12-03 LAB — CULTURE, BLOOD (ROUTINE X 2)
Culture: NO GROWTH
Culture: NO GROWTH

## 2023-12-03 LAB — CBC
HCT: 36.7 % (ref 36.0–46.0)
Hemoglobin: 11.6 g/dL — ABNORMAL LOW (ref 12.0–15.0)
MCH: 24.2 pg — ABNORMAL LOW (ref 26.0–34.0)
MCHC: 31.6 g/dL (ref 30.0–36.0)
MCV: 76.5 fL — ABNORMAL LOW (ref 80.0–100.0)
Platelets: 380 10*3/uL (ref 150–400)
RBC: 4.8 MIL/uL (ref 3.87–5.11)
RDW: 29.2 % — ABNORMAL HIGH (ref 11.5–15.5)
WBC: 11.7 10*3/uL — ABNORMAL HIGH (ref 4.0–10.5)
nRBC: 0 % (ref 0.0–0.2)

## 2023-12-03 LAB — VITAMIN B1

## 2023-12-03 MED ORDER — ALTEPLASE 2 MG IJ SOLR: 2.0000 mg | Freq: Once | INTRAMUSCULAR | Status: DC | PRN

## 2023-12-03 MED ORDER — PENTAFLUOROPROP-TETRAFLUOROETH EX AERO: 1.0000 | INHALATION_SPRAY | CUTANEOUS | Status: DC | PRN

## 2023-12-03 MED ORDER — LIDOCAINE-PRILOCAINE 2.5-2.5 % EX CREA: 1.0000 | TOPICAL_CREAM | CUTANEOUS | Status: DC | PRN

## 2023-12-03 MED ORDER — HEPARIN SODIUM (PORCINE) 1000 UNIT/ML DIALYSIS
2000.0000 [IU] | INTRAMUSCULAR | Status: DC | PRN
Start: 2023-12-04 — End: 2023-12-03

## 2023-12-03 MED ORDER — LIDOCAINE HCL (PF) 1 % IJ SOLN: 5.0000 mL | INTRAMUSCULAR | Status: DC | PRN

## 2023-12-03 MED ORDER — SODIUM CHLORIDE 0.45 % IV SOLN: INTRAVENOUS | Status: AC

## 2023-12-03 MED ORDER — HEPARIN SODIUM (PORCINE) 1000 UNIT/ML DIALYSIS
3000.0000 [IU] | Freq: Once | INTRAMUSCULAR | Status: AC
  Administered 2023-12-03: 3000 [IU] via INTRAVENOUS_CENTRAL
  Filled 2023-12-03: qty 3

## 2023-12-03 MED ORDER — ANTICOAGULANT SODIUM CITRATE 4% (200MG/5ML) IV SOLN: 5.0000 mL | Status: DC | PRN

## 2023-12-03 MED ORDER — NEPRO/CARBSTEADY PO LIQD: 237.0000 mL | ORAL | Status: DC | PRN

## 2023-12-03 MED ORDER — HEPARIN SODIUM (PORCINE) 1000 UNIT/ML IJ SOLN
INTRAMUSCULAR | Status: AC
  Filled 2023-12-03: qty 9

## 2023-12-03 MED ORDER — HEPARIN SODIUM (PORCINE) 1000 UNIT/ML DIALYSIS: 1000.0000 [IU] | INTRAMUSCULAR | Status: DC | PRN

## 2023-12-03 NOTE — Progress Notes (Signed)
 PROGRESS NOTE    [name] [name]  ZHY:[id number] DOB: [birth year] DOA: 11/21/2023 PCP: Alwyn Juba., MD   Brief Narrative: 58 year old African-American female with past medical history significant for ESRD on hemodialysis TTS, GERD, hypertension, left eye blindness, diabetes type 2 history of lumbar radiculopathy presents from SNF from [place] [facility] with left heel wound and cellulitis and suspicious for possible gas-forming infection.  She had been undergoing wound care treatment but did state that the foot has gotten worse.  Workup in the ED showed wound infection and cellulitis and concern for deep infection.  MRI confirm osteomyelitis.  Dr. 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  Hospital course complicated by multifocal stroke, depression catatonia Anemia presented with a hemoglobin of 6    Assessment & Plan:   Principal Problem:   Cellulitis of left lower extremity Active Problems:   Diabetic neuropathy (HCC)   Essential hypertension   GERD (gastroesophageal reflux disease)   ESRD (end stage renal disease) (HCC)   DM2 (diabetes mellitus, type 2) (HCC)   Anemia due to chronic kidney disease   Osteomyelitis of left foot (HCC)   1-Infected Diabetic Foot ulcer with Cellulitis/gangrene and gas-forming infection of the left heel/osteomyelitis:  -Patient presented with unstageable pressure heel left ulcer with a wound bed black eschar.  Followed by the wound clinic, but was not getting better. - Blood cultures no growth today. - MRI confirm osteomyelitis. - Dr. Julio Ohm consulted and recommended left BKA which patient agreed after lengthy discussion. - Underwent left BKA by Dr. Julio Ohm 11/25/2023, still has wound VAC in place - Completed antibiotics 11/28/2023   Multifocal stroke: - Post surgery, patient had intermittent somnolence and not having proper conversation.  It was thought that patient was severely  depressed, appear patient was making some effort with she could not talk, could not follow commands.  Stroke was suspected and MRI was obtained which confirmed multifocal ischemic stroke. - Neurology consulted, CTA head and neck negative for large vessel occlusion.  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 - Back on Eliquis  since 5/28. - LDL 93, continue atorvastatin . - Patient mental status improving.  Need to avoid hypotension.  Depression/catatonia - Patient with unwillingness or inability to talk or refusal of lab and medical care, psychiatry was consulted and at some point it was suspected that she had some catatonia, she received Ativan  and her symptoms improve. Have some intermittent symptoms. Ativan  as needed.  Patient appears improving.  Diabetes type 2 Diabetic neuropathy retinopathy - Continue sliding scale insulin .  Type she has been refusing insulin   Hyponatremia - Mild, correction with hemodialysis  Hypertension - Continue Norvasc , metoprolol  and Klonopin patch  ESRD on hemodialysis TTS - Continue with hemodialysis  Symptomatic anemia/anemia of chronic kidney disease - Hemoglobin on admission was 6.1, received 1 unit of packed red blood cell on admission.  Subsequently hemoglobin dropped again and received 2 more units of packed red blood cell. Continue to monitor hemoglobin   Thrombocytosis - Improved  GOC: Palliative care following   See wound care documentation below Pressure Injury 11/28/23 Sacrum Stage 3 -  Full thickness tissue loss. Subcutaneous fat may be visible but bone, tendon or muscle are NOT exposed. (Active)  11/28/23 0931  Location: Sacrum  Location Orientation:   Staging: Stage 3 -  Full thickness tissue loss. Subcutaneous fat may be visible but bone, tendon or muscle are NOT exposed.  Wound Description (Comments):   Present  on Admission:   Dressing Type Foam - Lift dressing to assess site every shift 12/03/23 1610     Estimated  body mass index is 25.58 kg/m as calculated from the following:   Height as of this encounter: 5' (1.524 m).   Weight as of this encounter: 59.4 kg.   DVT prophylaxis: Eliquis  Code Status: Full code Family Communication: Care discussed with patient Disposition Plan:  Status is: Inpatient Remains inpatient appropriate because: Management of stroke , foot infection. Discharge on Monday     Consultants:  Neurology Psychiatrist Nephrology  Procedures:  BKA  Antimicrobials:    Subjective: Report pain is controlled, denies dyspnea. She has been eating better.    Objective: Vitals:   12/03/23 0319 12/03/23 0500 12/03/23 0811 12/03/23 1119  BP: (!) 150/80  (!) 145/67 (!) 156/66  Pulse: (!) 58  61 (!) 58  Resp: 18  20 17   Temp: 98.7 F (37.1 C)  98.3 F (36.8 C) 98.2 F (36.8 C)  TempSrc: Oral  Oral Oral  SpO2: 100%  100% 100%  Weight:  59.4 kg    Height:        Intake/Output Summary (Last 24 hours) at 12/03/2023 1437 Last data filed at 12/02/2023 2032 Gross per 24 hour  Intake 120 ml  Output --  Net 120 ml   Filed Weights   12/01/23 1215 12/02/23 0415 12/03/23 0500  Weight: 68.1 kg 59.4 kg 59.4 kg    Examination:  General exam: NAD Respiratory system: CTA Cardiovascular system: S 1, S 2 RRR Gastrointestinal system: BS present, soft, nt Central nervous system:alert, oriented.  Extremities: Status post left BKA wound VAC in place   Data Reviewed: I have personally reviewed following labs and imaging studies  CBC: Recent Labs  Lab 11/28/23 1224 11/29/23 0520 11/30/23 1038 12/01/23 0830 12/02/23 1823 12/03/23 0255  WBC 17.4* 14.0* 12.9* 12.9* 11.7* 11.7*  NEUTROABS 14.8* 10.7* 10.2*  --   --   --   HGB 11.8* 12.6 12.6 10.4* 12.8 11.6*  HCT 35.1* 38.9 39.8 33.3* 39.9 36.7  MCV 74.1* 75.5* 76.2* 76.6* 76.3* 76.5*  PLT 320 347 347 388 330 380   Basic Metabolic Panel: Recent Labs  Lab 11/27/23 1105 12/01/23 0830 12/02/23 1823  NA 132* 136 134*   K 4.4 4.0 3.8  CL 95* 98 92*  CO2 22 24 27   GLUCOSE 80 82 91  BUN 26* 26* 27*  CREATININE 3.88* 3.28* 3.04*  CALCIUM  7.4* 7.7* 8.5*  PHOS 3.5 2.6  --    GFR: Estimated Creatinine Clearance: 16.5 mL/min (A) (by C-G formula based on SCr of 3.04 mg/dL (H)). Liver Function Tests: Recent Labs  Lab 11/27/23 1105 12/01/23 0830  ALBUMIN  2.0* 2.0*   No results for input(s): "LIPASE", "AMYLASE" in the last 168 hours. Recent Labs  Lab 11/28/23 1846  AMMONIA 13   Coagulation Profile: No results for input(s): "INR", "PROTIME" in the last 168 hours. Cardiac Enzymes: No results for input(s): "CKTOTAL", "CKMB", "CKMBINDEX", "TROPONINI" in the last 168 hours. BNP (last 3 results) No results for input(s): "PROBNP" in the last 8760 hours. HbA1C: No results for input(s): "HGBA1C" in the last 72 hours. CBG: Recent Labs  Lab 12/02/23 1136 12/02/23 1632 12/02/23 2115 12/03/23 0656 12/03/23 1119  GLUCAP 119* 104* 116* 106* 105*   Lipid Profile: No results for input(s): "CHOL", "HDL", "LDLCALC", "TRIG", "CHOLHDL", "LDLDIRECT" in the last 72 hours. Thyroid  Function Tests: No results for input(s): "TSH", "T4TOTAL", "FREET4", "T3FREE", "THYROIDAB" in the last  72 hours. Anemia Panel: No results for input(s): "VITAMINB12", "FOLATE", "FERRITIN", "TIBC", "IRON ", "RETICCTPCT" in the last 72 hours. Sepsis Labs: No results for input(s): "PROCALCITON", "LATICACIDVEN" in the last 168 hours.  Recent Results (from the past 240 hours)  Surgical pcr screen     Status: None   Collection Time: 11/25/23 11:31 AM   Specimen: Nasal Mucosa; Nasal Swab  Result Value Ref Range Status   MRSA, PCR NEGATIVE NEGATIVE Final   Staphylococcus aureus NEGATIVE NEGATIVE Final    Comment: (NOTE) The Xpert SA Assay (FDA approved for NASAL specimens in patients 37 years of age and older), is one component of a comprehensive surveillance program. It is not intended to diagnose infection nor to guide or monitor  treatment. Performed at [facility], [address] [place], [place] [address]   Culture, blood (Routine X 2) w Reflex to ID Panel     Status: None   Collection Time: 11/28/23  7:18 PM   Specimen: BLOOD LEFT HAND  Result Value Ref Range Status   Specimen Description BLOOD LEFT HAND  Final   Special Requests   Final    BOTTLES DRAWN AEROBIC ONLY Blood Culture results may not be optimal due to an inadequate volume of blood received in culture bottles   Culture   Final    NO GROWTH 5 DAYS Performed at [facility], [address], [place], [place] [address]    Report Status 12/03/2023 FINAL  Final  Culture, blood (Routine X 2) w Reflex to ID Panel     Status: None   Collection Time: 11/28/23  7:18 PM   Specimen: BLOOD LEFT HAND  Result Value Ref Range Status   Specimen Description BLOOD LEFT HAND  Final   Special Requests   Final    AEROBIC BOTTLE ONLY Blood Culture results may not be optimal due to an inadequate volume of blood received in culture bottles   Culture   Final    NO GROWTH 5 DAYS Performed at [facility], [address], [place], [place] [address]    Report Status 12/03/2023 FINAL  Final         Radiology Studies: No results found.       Scheduled Meds:  amLODipine   5 mg Oral QPM   apixaban   5 mg Oral BID   vitamin C   1,000 mg Oral Daily   atorvastatin   20 mg Oral Daily   calcitRIOL   0.25 mcg Oral Q T,Th,Sa-HD   calcium  carbonate  1 tablet Oral TID WC   Chlorhexidine  Gluconate Cloth  6 each Topical Q0600   cloNIDine   0.1 mg Transdermal Weekly   insulin  aspart  0-5 Units Subcutaneous QHS   insulin  aspart  0-6 Units Subcutaneous TID WC   metoprolol  tartrate  50 mg Oral BID   nutrition supplement (JUVEN)  1 packet Oral BID BM   sodium chloride  flush  10-40 mL Intracatheter Q12H   thiamine   100 mg Oral Daily   zinc  sulfate (50mg  elemental zinc )  220 mg Oral Daily   Continuous Infusions:  sodium chloride  75 mL/hr at 12/03/23 1049    sodium chloride        LOS: 12 days    Time spent: 35 minutes    Daivd Fredericksen A Evalette Montrose, MD [facility]   If 7PM-7AM, please contact night-coverage [email or web address]  12/03/2023, 2:37 PM

## 2023-12-03 NOTE — Plan of Care (Signed)
  Problem: Education: Goal: Knowledge of disease or condition will improve Outcome: Progressing   Problem: Ischemic Stroke/TIA Tissue Perfusion: Goal: Complications of ischemic stroke/TIA will be minimized Outcome: Progressing   Problem: Health Behavior/Discharge Planning: Goal: Goals will be collaboratively established with patient/family Outcome: Progressing   Problem: Self-Care: Goal: Ability to communicate needs accurately will improve Outcome: Progressing   Problem: Education: Goal: Knowledge of secondary prevention will improve (MUST DOCUMENT ALL) Outcome: Not Progressing Goal: Knowledge of patient specific risk factors will improve (DELETE if not current risk factor) Outcome: Not Progressing   Problem: Coping: Goal: Will verbalize positive feelings about self Outcome: Not Progressing Goal: Will identify appropriate support needs Outcome: Not Progressing   Problem: Health Behavior/Discharge Planning: Goal: Ability to manage health-related needs will improve Outcome: Not Progressing   Problem: Self-Care: Goal: Ability to participate in self-care as condition permits will improve Outcome: Not Progressing Goal: Verbalization of feelings and concerns over difficulty with self-care will improve Outcome: Not Progressing

## 2023-12-03 NOTE — Progress Notes (Signed)
  [facility] Progress Note   Subjective:    Pt seen in room She is more alert today, asking for pain medicine Knows "hospital", "2025"  Objective Vitals:   12/02/23 2330 12/03/23 0319 12/03/23 0500 12/03/23 0811  BP: 133/79 (!) 150/80  (!) 145/67  Pulse: (!) 56 (!) 58  61  Resp: 18 18  20   Temp: 98.8 F (37.1 C) 98.7 F (37.1 C)  98.3 F (36.8 C)  TempSrc: Oral Oral  Oral  SpO2: 98% 100%  100%  Weight:   59.4 kg   Height:       Physical Exam General: alert, responsive, improving Heart: RRR, no murmurs, rubs or gallops Lungs: CTA bilaterally, respirations unlabored  Abdomen: Soft, non-distended, +BS Extremities: No edema b/l lower extremities Dialysis Access: internal jugular TDC  OP HD: GO TTS  3h  B350   64kg   TDC  Heparin  5000 Mircera 225 mcg q 2 weeks. Last dose 11/17/23 Calcitriol  0.25 mcg q HD  Assessment/Plan: SP L BKA: on 5/23 for non-healing L foot infection  ESRD: HD TTS. Next HD today.  HTN: bp's are good on catapres  patch, norvasc  and metoprolol  here as at home. Home hydralazine  on hold.  Volume: note that her EDW will need to decrease post-BKA. Looks dry on exam, 4-5kg under drywt. Will start some IVF"s overnight. Keep even w/ HD.  Anemia of CKD: getting max ESA here as at OP unit. SP 2u prbc 5/25 for Hb 6.0.  follow.  Metabolic bone disease: hypophosphatemia, now improved. Holding binder. On calcitriol .  Encephalopathy: MRI with bilateral watershed infarct (stroke) pattern. Looks to be improving. Per neuro/ pmd.    Larry Poag  MD  [facility] 12/03/2023, 8:37 AM  Recent Labs  Lab 11/27/23 1105 11/28/23 1224 12/01/23 0830 12/02/23 1823 12/03/23 0255  HGB 6.0*   < > 10.4* 12.8 11.6*  ALBUMIN  2.0*  --  2.0*  --   --   CALCIUM  7.4*  --  7.7* 8.5*  --   PHOS 3.5  --  2.6  --   --   CREATININE 3.88*  --  3.28* 3.04*  --   K 4.4  --  4.0 3.8  --    < > = values in this interval not displayed.    Inpatient medications:  amLODipine    5 mg Oral QPM   apixaban   5 mg Oral BID   vitamin C   1,000 mg Oral Daily   atorvastatin   20 mg Oral Daily   calcitRIOL   0.25 mcg Oral Q T,Th,Sa-HD   calcium  carbonate  1 tablet Oral TID WC   Chlorhexidine  Gluconate Cloth  6 each Topical Q0600   cloNIDine   0.1 mg Transdermal Weekly   insulin  aspart  0-5 Units Subcutaneous QHS   insulin  aspart  0-6 Units Subcutaneous TID WC   metoprolol  tartrate  50 mg Oral BID   nutrition supplement (JUVEN)  1 packet Oral BID BM   sodium chloride  flush  10-40 mL Intracatheter Q12H   thiamine   100 mg Oral Daily   zinc  sulfate (50mg  elemental zinc )  220 mg Oral Daily    sodium chloride      acetaminophen  **OR** acetaminophen , calcium  carbonate (dosed in mg elemental calcium ), camphor-menthol  **AND** hydrOXYzine , docusate sodium , feeding supplement (NEPRO CARB STEADY), HYDROmorphone  (DILAUDID ) injection, LORazepam , naLOXone  (NARCAN )  injection, ondansetron  **OR** ondansetron  (ZOFRAN ) IV, oxyCODONE , oxyCODONE , prochlorperazine , sodium chloride  flush, sorbitol

## 2023-12-03 NOTE — Plan of Care (Signed)
  Problem: Ischemic Stroke/TIA Tissue Perfusion: Goal: Complications of ischemic stroke/TIA will be minimized Outcome: Progressing   Problem: Education: Goal: Knowledge of General Education information will improve Description: Including pain rating scale, medication(s)/side effects and non-pharmacologic comfort measures Outcome: Not Progressing   Problem: Education: Goal: Knowledge of disease or condition will improve Outcome: Not Progressing Goal: Knowledge of secondary prevention will improve (MUST DOCUMENT ALL) Outcome: Not Progressing Goal: Knowledge of patient specific risk factors will improve (DELETE if not current risk factor) Outcome: Not Progressing   Problem: Coping: Goal: Will verbalize positive feelings about self Outcome: Not Progressing Goal: Will identify appropriate support needs Outcome: Not Progressing   Problem: Health Behavior/Discharge Planning: Goal: Ability to manage health-related needs will improve Outcome: Not Progressing Goal: Goals will be collaboratively established with patient/family Outcome: Not Progressing   Problem: Self-Care: Goal: Ability to participate in self-care as condition permits will improve Outcome: Not Progressing Goal: Verbalization of feelings and concerns over difficulty with self-care will improve Outcome: Not Progressing Goal: Ability to communicate needs accurately will improve Outcome: Not Progressing

## 2023-12-03 NOTE — Progress Notes (Signed)
 Received patient in bed to unit.  Alert confused, slow in response Informed consent signed and in chart. TX duration: 3hr   Patient tolerated well.  Transported back to the room Alert, without acute distress.  Hand-off given to patient's nurse.   Access used: HD Perm Catheter  Access issues: none  Total UF removed: 0  Medication(s) given: none  Post HD VS: SEE table  Post HD weight: UTA      12/03/23 2319  Vitals  Temp 98.1 F (36.7 C)  Temp Source Oral  BP (!) 158/68  MAP (mmHg) 94  BP Location Right Arm  BP Method Automatic  Patient Position (if appropriate) Lying  Pulse Rate (!) 54  Pulse Rate Source Monitor  ECG Heart Rate (!) 56  Resp (!) 8  Oxygen Therapy  SpO2 98 %  O2 Device Nasal Cannula  O2 Flow Rate (L/min) 2 L/min  Patient Activity (if Appropriate) In bed  Pulse Oximetry Type Continuous  During Treatment Monitoring  Blood Flow Rate (mL/min) 0 mL/min  Arterial Pressure (mmHg) 9.09 mmHg  Venous Pressure (mmHg) -77.57 mmHg  TMP (mmHg) 9.29 mmHg  Ultrafiltration Rate (mL/min) 107 mL/min  Dialysate Flow Rate (mL/min) 300 ml/min  Dialysate Potassium Concentration 3  Dialysate Calcium  Concentration 2.5  Duration of HD Treatment -hour(s) 3 hour(s)  Cumulative Fluid Removed (mL) per Treatment  0.03  HD Safety Checks Performed Yes  Intra-Hemodialysis Comments Tx completed  Post Treatment  Dialyzer Clearance Lightly streaked  Hemodialysis Intake (mL) 0 mL  Liters Processed 63  Fluid Removed (mL) 0 mL  Tolerated HD Treatment Yes  Post-Hemodialysis Comments Pt tolerated HD  Hemodialysis Catheter Left Internal jugular  Placement Date/Time: 10/13/20 1501   Time Out: Correct patient;Correct site;Correct procedure  Maximum sterile barrier precautions: Hand hygiene;Cap;Mask  Site Prep: Other (comment)  Local Anesthetic: Injectable - 1% Lidocaine   Ultrasound Used?: No  V...  Blue Lumen Status Flushed;Heparin  locked;Dead end cap in place  Red Lumen Status  Flushed;Heparin  locked;Dead end cap in place  Catheter fill solution Heparin  1000 units/ml  Catheter fill volume (Arterial) 1.9 cc  Catheter fill volume (Venous) 1.9  Dressing Type Transparent  Dressing Status Antimicrobial disc/dressing in place  Interventions Other (Comment) (new dead end caps)  Drainage Description None  Dressing Change Due 12/08/23  Post treatment catheter status Capped and Clamped     Staci Dykes, BSN, RN [facility]

## 2023-12-04 DIAGNOSIS — E1122 Type 2 diabetes mellitus with diabetic chronic kidney disease: Secondary | ICD-10-CM | POA: Diagnosis not present

## 2023-12-04 DIAGNOSIS — I639 Cerebral infarction, unspecified: Secondary | ICD-10-CM | POA: Diagnosis not present

## 2023-12-04 DIAGNOSIS — M869 Osteomyelitis, unspecified: Secondary | ICD-10-CM | POA: Diagnosis not present

## 2023-12-04 DIAGNOSIS — L03116 Cellulitis of left lower limb: Secondary | ICD-10-CM | POA: Diagnosis not present

## 2023-12-04 LAB — GLUCOSE, CAPILLARY
Glucose-Capillary: 136 mg/dL — ABNORMAL HIGH (ref 70–99)
Glucose-Capillary: 153 mg/dL — ABNORMAL HIGH (ref 70–99)
Glucose-Capillary: 109 mg/dL — ABNORMAL HIGH (ref 70–99)
Glucose-Capillary: 116 mg/dL — ABNORMAL HIGH (ref 70–99)

## 2023-12-04 MED ORDER — HYDROMORPHONE HCL 1 MG/ML IJ SOLN: 0.5000 mg | INTRAMUSCULAR | Status: DC | PRN

## 2023-12-04 MED ORDER — HYDROMORPHONE HCL 2 MG PO TABS
1.0000 mg | ORAL_TABLET | ORAL | Status: DC | PRN
  Administered 2023-12-04 – 2023-12-06 (×4): 1 mg via ORAL
  Filled 2023-12-04 (×3): qty 1

## 2023-12-04 NOTE — Progress Notes (Addendum)
 Pt reported that oxycodone  caused her itchy. Requested  a different pain medication. MD informed. New orders received.  Oral Billings, RN

## 2023-12-04 NOTE — Progress Notes (Signed)
  [name] Progress Note   Subjective:    Pt seen in room More alert again today, feeding herself breakfast in the bed  Objective Vitals:   12/04/23 0742 12/04/23 1020 12/04/23 1124 12/04/23 1536  BP: (!) 182/76 (!) 148/70 (!) 166/72 (!) 168/71  Pulse: 64  61 62  Resp: 18  17 18   Temp: 97.7 F (36.5 C)  98 F (36.7 C) 98.3 F (36.8 C)  TempSrc: Oral  Oral Axillary  SpO2: 97%  98% 100%  Weight:      Height:       Physical Exam General: alert, responsive, improving again  Heart: RRR, no murmurs, rubs or gallops Lungs: CTA bilaterally, respirations unlabored  Abdomen: Soft, non-distended, +BS Extremities: No edema b/l lower extremities Dialysis Access: internal jugular TDC  OP HD: GO TTS  3h  B350   64kg   TDC  Heparin  5000 Mircera 225 mcg q 2 weeks. Last dose 11/17/23 Calcitriol  0.25 mcg q HD  Assessment/Plan: SP L BKA: on 5/23 for non-healing L foot infection  ESRD: HD TTS. Next HD 6/03.  HTN: bp's are good on catapres  patch, norvasc  and metoprolol  here as at home. Home hydralazine  on hold. Volume: dry wt should drop 2.5- 3 kg due to BKA. Pt is 3kg under today. Eating now, doesn't look dry. Keep even next HD.  Anemia of CKD: getting max ESA here as at OP unit. SP 2u prbc 5/25. Hb 10-12 now. Follow.  Metabolic bone disease: hypophosphatemia, now improved. Holding binder. On calcitriol .  Encephalopathy: MRI with bilateral watershed infarct (stroke) pattern. Looks to be improving. Per neuro/ pmd.    [name] Poag  MD  [facility] 12/04/2023, 6:31 PM  Recent Labs  Lab 12/01/23 0830 12/02/23 1823 12/03/23 0255  HGB 10.4* 12.8 11.6*  ALBUMIN  2.0*  --   --   CALCIUM  7.7* 8.5*  --   PHOS 2.6  --   --   CREATININE 3.28* 3.04*  --   K 4.0 3.8  --     Inpatient medications:  amLODipine   5 mg Oral QPM   apixaban   5 mg Oral BID   vitamin C   1,000 mg Oral Daily   atorvastatin   20 mg Oral Daily   calcitRIOL   0.25 mcg Oral Q T,Th,Sa-HD   calcium  carbonate   1 tablet Oral TID WC   Chlorhexidine  Gluconate Cloth  6 each Topical Q0600   cloNIDine   0.1 mg Transdermal Weekly   insulin  aspart  0-5 Units Subcutaneous QHS   insulin  aspart  0-6 Units Subcutaneous TID WC   metoprolol  tartrate  50 mg Oral BID   nutrition supplement (JUVEN)  1 packet Oral BID BM   sodium chloride  flush  10-40 mL Intracatheter Q12H   thiamine   100 mg Oral Daily   zinc  sulfate (50mg  elemental zinc )  220 mg Oral Daily    sodium chloride      acetaminophen  **OR** acetaminophen , calcium  carbonate (dosed in mg elemental calcium ), camphor-menthol  **AND** hydrOXYzine , docusate sodium , feeding supplement (NEPRO CARB STEADY), HYDROmorphone  (DILAUDID ) injection, HYDROmorphone , LORazepam , naLOXone  (NARCAN )  injection, ondansetron  **OR** ondansetron  (ZOFRAN ) IV, prochlorperazine , sodium chloride  flush, sorbitol

## 2023-12-04 NOTE — Progress Notes (Signed)
 PROGRESS NOTE    [name] [name]  ZOX:[id number] DOB: December 05, 1965 DOA: 11/21/2023 PCP: Alwyn Juba., MD   Brief Narrative: 58 year old African-American female with past medical history significant for ESRD on hemodialysis TTS, GERD, hypertension, left eye blindness, diabetes type 2 history of lumbar radiculopathy presents from SNF from [place] [facility] with left heel wound and cellulitis and suspicious for possible gas-forming infection.  She had been undergoing wound care treatment but did state that the foot has gotten worse.  Workup in the ED showed wound infection and cellulitis and concern for deep infection.  MRI confirm osteomyelitis.  Dr. 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  Hospital course complicated by multifocal stroke, depression catatonia Anemia presented with a hemoglobin of 6    Assessment & Plan:   Principal Problem:   Cellulitis of left lower extremity Active Problems:   Diabetic neuropathy (HCC)   Essential hypertension   GERD (gastroesophageal reflux disease)   ESRD (end stage renal disease) (HCC)   DM2 (diabetes mellitus, type 2) (HCC)   Anemia due to chronic kidney disease   Osteomyelitis of left foot (HCC)   1-Infected Diabetic Foot ulcer with Cellulitis/gangrene and gas-forming infection of the left heel/osteomyelitis:  -Patient presented with unstageable pressure heel left ulcer with a wound bed black eschar.  Followed by the wound clinic, but was not getting better. - Blood cultures no growth today. - MRI confirm osteomyelitis. - Dr. Julio Ohm consulted and recommended left BKA which patient agreed after lengthy discussion. 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   Multifocal stroke: - Post surgery, patient had intermittent somnolence and not having proper conversation.  It was thought that patient  was severely depressed, appear patient was making some effort with she could not talk, could not follow commands.  Stroke was suspected and MRI was obtained which confirmed multifocal ischemic stroke. - Neurology consulted, CTA head and neck negative for large vessel occlusion.  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 - Back on Eliquis  since 5/28. - LDL 93, continue atorvastatin . - Patient mental status improving.  Need to avoid hypotension.  Depression/catatonia - Patient with unwillingness or inability to talk or refusal of lab and medical care, psychiatry was consulted and at some point it was suspected that she had some catatonia, she received Ativan  and her symptoms improve. Have some intermittent symptoms. Ativan  as needed.  Patient appears improving.  Diabetes type 2 Diabetic neuropathy retinopathy - Continue sliding scale insulin .  Type she has been refusing insulin   Hyponatremia - Mild, correction with hemodialysis  Hypertension - Continue Norvasc , metoprolol  and Klonopin patch  ESRD on hemodialysis TTS - Continue with hemodialysis  Symptomatic anemia/anemia of chronic kidney disease - Hemoglobin on admission was 6.1, received 1 unit of packed red blood cell on admission.  Subsequently hemoglobin dropped again and received 2 more units of packed red blood cell. Continue to monitor hemoglobin   Thrombocytosis - Improved  GOC: Palliative care following   See wound care documentation below Pressure Injury 11/28/23 Sacrum Stage 3 -  Full thickness tissue loss. Subcutaneous fat may be visible but bone, tendon or muscle are NOT exposed. (Active)  11/28/23 0931  Location: Sacrum  Location Orientation:   Staging: Stage 3 -  Full thickness tissue loss. Subcutaneous fat may be visible but bone, tendon or muscle are NOT exposed.  Wound Description (Comments):  Present on Admission:   Dressing Type Foam - Lift dressing to assess site every shift 12/04/23 1000      Estimated body mass index is 26.61 kg/m as calculated from the following:   Height as of this encounter: 5' (1.524 m).   Weight as of this encounter: 61.8 kg.   DVT prophylaxis: Eliquis  Code Status: Full code Family Communication: Care discussed with patient Disposition Plan:  Status is: Inpatient Remains inpatient appropriate because: Management of stroke , foot infection. Discharge on Monday     Consultants:  Neurology Psychiatrist Nephrology  Procedures:  BKA  Antimicrobials:    Subjective: She is alert, answer questions. She has been eating.   Objective: Vitals:   12/04/23 0615 12/04/23 0742 12/04/23 1020 12/04/23 1124  BP:  (!) 182/76 (!) 148/70 (!) 166/72  Pulse:  64  61  Resp:  18  17  Temp:  97.7 F (36.5 C)  98 F (36.7 C)  TempSrc:  Oral  Oral  SpO2:  97%  98%  Weight: 61.8 kg     Height:        Intake/Output Summary (Last 24 hours) at 12/04/2023 1205 Last data filed at 12/04/2023 0900 Gross per 24 hour  Intake 1723.58 ml  Output 0 ml  Net 1723.58 ml   Filed Weights   12/03/23 0500 12/03/23 2102 12/04/23 0615  Weight: 59.4 kg 59.4 kg 61.8 kg    Examination:  General exam: NAD Respiratory system: CTA Cardiovascular system:S 1, S 2 RRR Gastrointestinal system: BS present, soft, nt Central nervous system: Alert Extremities: Status post left BKA wound VAC in place   Data Reviewed: I have personally reviewed following labs and imaging studies  CBC: Recent Labs  Lab 11/28/23 1224 11/29/23 0520 11/30/23 1038 12/01/23 0830 12/02/23 1823 12/03/23 0255  WBC 17.4* 14.0* 12.9* 12.9* 11.7* 11.7*  NEUTROABS 14.8* 10.7* 10.2*  --   --   --   HGB 11.8* 12.6 12.6 10.4* 12.8 11.6*  HCT 35.1* 38.9 39.8 33.3* 39.9 36.7  MCV 74.1* 75.5* 76.2* 76.6* 76.3* 76.5*  PLT 320 347 347 388 330 380   Basic Metabolic Panel: Recent Labs  Lab 12/01/23 0830 12/02/23 1823  NA 136 134*  K 4.0 3.8  CL 98 92*  CO2 24 27  GLUCOSE 82 91  BUN 26* 27*   CREATININE 3.28* 3.04*  CALCIUM  7.7* 8.5*  PHOS 2.6  --    GFR: Estimated Creatinine Clearance: 16.8 mL/min (A) (by C-G formula based on SCr of 3.04 mg/dL (H)). Liver Function Tests: Recent Labs  Lab 12/01/23 0830  ALBUMIN  2.0*   No results for input(s): "LIPASE", "AMYLASE" in the last 168 hours. Recent Labs  Lab 11/28/23 1846  AMMONIA 13   Coagulation Profile: No results for input(s): "INR", "PROTIME" in the last 168 hours. Cardiac Enzymes: No results for input(s): "CKTOTAL", "CKMB", "CKMBINDEX", "TROPONINI" in the last 168 hours. BNP (last 3 results) No results for input(s): "PROBNP" in the last 8760 hours. HbA1C: No results for input(s): "HGBA1C" in the last 72 hours. CBG: Recent Labs  Lab 12/03/23 1119 12/03/23 1608 12/03/23 2358 12/04/23 0609 12/04/23 1125  GLUCAP 105* 108* 80 136* 153*   Lipid Profile: No results for input(s): "CHOL", "HDL", "LDLCALC", "TRIG", "CHOLHDL", "LDLDIRECT" in the last 72 hours. Thyroid  Function Tests: No results for input(s): "TSH", "T4TOTAL", "FREET4", "T3FREE", "THYROIDAB" in the last 72 hours. Anemia Panel: No results for input(s): "VITAMINB12", "FOLATE", "FERRITIN", "TIBC", "IRON ", "RETICCTPCT" in the last 72 hours. Sepsis Labs: No  results for input(s): "PROCALCITON", "LATICACIDVEN" in the last 168 hours.  Recent Results (from the past 240 hours)  Surgical pcr screen     Status: None   Collection Time: 11/25/23 11:31 AM   Specimen: Nasal Mucosa; Nasal Swab  Result Value Ref Range Status   MRSA, PCR NEGATIVE NEGATIVE Final   Staphylococcus aureus NEGATIVE NEGATIVE Final    Comment: (NOTE) The Xpert SA Assay (FDA approved for NASAL specimens in patients 56 years of age and older), is one component of a comprehensive surveillance program. It is not intended to diagnose infection nor to guide or monitor treatment. Performed at [facility], [address], [place], [place] [address]   Culture, blood (Routine X 2) w  Reflex to ID Panel     Status: None   Collection Time: 11/28/23  7:18 PM   Specimen: BLOOD LEFT HAND  Result Value Ref Range Status   Specimen Description BLOOD LEFT HAND  Final   Special Requests   Final    BOTTLES DRAWN AEROBIC ONLY Blood Culture results may not be optimal due to an inadequate volume of blood received in culture bottles   Culture   Final    NO GROWTH 5 DAYS Performed at [facility], [address] [place], [place] [address]    Report Status 12/03/2023 FINAL  Final  Culture, blood (Routine X 2) w Reflex to ID Panel     Status: None   Collection Time: 11/28/23  7:18 PM   Specimen: BLOOD LEFT HAND  Result Value Ref Range Status   Specimen Description BLOOD LEFT HAND  Final   Special Requests   Final    AEROBIC BOTTLE ONLY Blood Culture results may not be optimal due to an inadequate volume of blood received in culture bottles   Culture   Final    NO GROWTH 5 DAYS Performed at [facility], [address] [place], [place] [address]    Report Status 12/03/2023 FINAL  Final         Radiology Studies: No results found.       Scheduled Meds:  amLODipine   5 mg Oral QPM   apixaban   5 mg Oral BID   vitamin C   1,000 mg Oral Daily   atorvastatin   20 mg Oral Daily   calcitRIOL   0.25 mcg Oral Q T,Th,Sa-HD   calcium  carbonate  1 tablet Oral TID WC   Chlorhexidine  Gluconate Cloth  6 each Topical Q0600   cloNIDine   0.1 mg Transdermal Weekly   insulin  aspart  0-5 Units Subcutaneous QHS   insulin  aspart  0-6 Units Subcutaneous TID WC   metoprolol  tartrate  50 mg Oral BID   nutrition supplement (JUVEN)  1 packet Oral BID BM   sodium chloride  flush  10-40 mL Intracatheter Q12H   thiamine   100 mg Oral Daily   zinc  sulfate (50mg  elemental zinc )  220 mg Oral Daily   Continuous Infusions:  sodium chloride        LOS: 13 days    Time spent: 35 minutes    Edenilson Austad A Wei Newbrough, MD [facility]   If 7PM-7AM, please contact  night-coverage [email or web address]  12/04/2023, 12:05 PM

## 2023-12-05 DIAGNOSIS — L03116 Cellulitis of left lower limb: Secondary | ICD-10-CM | POA: Diagnosis not present

## 2023-12-05 DIAGNOSIS — M869 Osteomyelitis, unspecified: Secondary | ICD-10-CM | POA: Diagnosis not present

## 2023-12-05 DIAGNOSIS — I639 Cerebral infarction, unspecified: Secondary | ICD-10-CM | POA: Diagnosis not present

## 2023-12-05 DIAGNOSIS — E1122 Type 2 diabetes mellitus with diabetic chronic kidney disease: Secondary | ICD-10-CM | POA: Diagnosis not present

## 2023-12-05 LAB — GLUCOSE, CAPILLARY
Glucose-Capillary: 107 mg/dL — ABNORMAL HIGH (ref 70–99)
Glucose-Capillary: 106 mg/dL — ABNORMAL HIGH (ref 70–99)
Glucose-Capillary: 103 mg/dL — ABNORMAL HIGH (ref 70–99)
Glucose-Capillary: 123 mg/dL — ABNORMAL HIGH (ref 70–99)

## 2023-12-05 MED ORDER — CHLORHEXIDINE GLUCONATE CLOTH 2 % EX PADS: 6.0000 | MEDICATED_PAD | Freq: Every day | CUTANEOUS | Status: DC

## 2023-12-05 NOTE — Progress Notes (Signed)
 Out Patient Arrangements:  Pt receives out-pt HD at [facility] on TTS 11:40 am chair time. Pt admitted from snf per notes. Please advise if this changes as this may impact hd arrangements.   Gable Johann [facility] [phone]

## 2023-12-05 NOTE — Plan of Care (Signed)
   Problem: Ischemic Stroke/TIA Tissue Perfusion: Goal: Complications of ischemic stroke/TIA will be minimized Outcome: Progressing

## 2023-12-05 NOTE — TOC Progression Note (Signed)
 Transition of Care [facility]) - Progression Note    Patient Details  Name: [name] [name] MRN: [id number] Date of Birth: [birth year]  [facility] CM/SW Contact  Tandy Fam, [place] Phone Number: 12/05/2023, 12:21 PM  Clinical Narrative:   CSW coordinated with MD about discharge plan, need to ensure patient can participate in outpatient HD prior to discharge. CSW provided update to [facility]. CSW to follow.    Expected Discharge Plan: Skilled Nursing Facility Barriers to Discharge: Continued Medical Work up  Expected Discharge Plan and Services       Living arrangements for the past 2 months: Skilled Nursing Facility                                       Social Determinants of Health (SDOH) Interventions SDOH Screenings   Food Insecurity: No Food Insecurity (11/21/2023)  Housing: Unknown (11/21/2023)  Transportation Needs: No Transportation Needs (11/21/2023)  Utilities: Not At Risk (11/21/2023)  Social Connections: Unknown (11/15/2021)   [facility]  Tobacco Use: Low Risk  (11/25/2023)    Readmission Risk Interventions     No data to display

## 2023-12-05 NOTE — Progress Notes (Signed)
 PROGRESS NOTE    [name] [name]  WUJ:[id number] DOB: [birth year] DOA: 11/21/2023 PCP: Alwyn Juba., MD   Brief Narrative: 58 year old African-American female with past medical history significant for ESRD on hemodialysis TTS, GERD, hypertension, left eye blindness, diabetes type 2 history of lumbar radiculopathy presents from SNF from [place] [facility] with left heel wound and cellulitis and suspicious for possible gas-forming infection.  She had been undergoing wound care treatment but did state that the foot has gotten worse.  Workup in the ED showed wound infection and cellulitis and concern for deep infection.  MRI confirm osteomyelitis.  Dr. 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  Hospital course complicated by multifocal stroke, depression catatonia Anemia presented with a hemoglobin of 6    Assessment & Plan:   Principal Problem:   Cellulitis of left lower extremity Active Problems:   Diabetic neuropathy (HCC)   Essential hypertension   GERD (gastroesophageal reflux disease)   ESRD (end stage renal disease) (HCC)   DM2 (diabetes mellitus, type 2) (HCC)   Anemia due to chronic kidney disease   Osteomyelitis of left foot (HCC)   1-Infected Diabetic Foot ulcer with Cellulitis/gangrene and gas-forming infection of the left heel/osteomyelitis:  -Patient presented with unstageable pressure heel left ulcer with a wound bed black eschar.  Followed by the wound clinic, but was not getting better. - Blood cultures no growth today. - MRI confirm osteomyelitis. - Dr. Julio Ohm consulted and recommended left BKA which patient agreed after lengthy discussion. 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   Multifocal stroke: - Post surgery, patient had intermittent somnolence and not having proper conversation.  It was thought that patient  was severely depressed, appear patient was making some effort with she could not talk, could not follow commands.  Stroke was suspected and MRI was obtained which confirmed multifocal ischemic stroke. - Neurology consulted, CTA head and neck negative for large vessel occlusion.  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 - Back on Eliquis  since 5/28. - LDL 93, continue atorvastatin . - Patient mental status improving.  Need to avoid hypotension.  Depression/catatonia - Patient with unwillingness or inability to talk or refusal of lab and medical care, psychiatry was consulted and at some point it was suspected that she had some catatonia, she received Ativan  and her symptoms improve. Have some intermittent symptoms. Ativan  as needed.  Patient improving. Has not required Ativan .   Diabetes type 2 Diabetic neuropathy retinopathy - Continue sliding scale insulin .  Type she has been refusing insulin   Hyponatremia - Mild, correction with hemodialysis  Hypertension - Continue Norvasc , metoprolol  and Klonopin patch  ESRD on hemodialysis TTS - Continue with hemodialysis Needs to seat for HD, to make sure she will tolerate out patient HD>   Symptomatic anemia/anemia of chronic kidney disease - Hemoglobin on admission was 6.1, received 1 unit of packed red blood cell on admission.  Subsequently hemoglobin dropped again and received 2 more units of packed red blood cell. Continue to monitor hemoglobin   Thrombocytosis - Improved  GOC: Palliative care following   See wound care documentation below Pressure Injury 11/28/23 Sacrum Stage 3 -  Full thickness tissue loss. Subcutaneous fat may be visible but bone, tendon or muscle are NOT exposed. (Active)  11/28/23 0931  Location: Sacrum  Location Orientation:   Staging: Stage 3 -  Full thickness tissue  loss. Subcutaneous fat may be visible but bone, tendon or muscle are NOT exposed.  Wound Description (Comments):   Present on  Admission:   Dressing Type Foam - Lift dressing to assess site every shift 12/05/23 1500     Estimated body mass index is 26.91 kg/m as calculated from the following:   Height as of this encounter: 5' (1.524 m).   Weight as of this encounter: 62.5 kg.   DVT prophylaxis: Eliquis  Code Status: Full code Family Communication: Care discussed with patient Disposition Plan:  Status is: Inpatient Remains inpatient appropriate because: Management of stroke , foot infection. Stable for discharge, needs to make sure she is abel to seat for HD    Consultants:  Neurology Psychiatrist Nephrology  Procedures:  BKA  Antimicrobials:    Subjective: Alert, answer questions. Said, dilaudid  help more with pain.  She is willing to work with PT and seat for HD>   Objective: Vitals:   12/05/23 0510 12/05/23 0806 12/05/23 1219 12/05/23 1625  BP:  (!) 174/74 (!) 157/63 (!) 125/54  Pulse:  (!) 59 63 (!) 58  Resp:  12 18 14   Temp:  98.1 F (36.7 C) 99 F (37.2 C) 98.1 F (36.7 C)  TempSrc:  Oral Oral Oral  SpO2:  99% 100% 98%  Weight: 62.5 kg     Height:        Intake/Output Summary (Last 24 hours) at 12/05/2023 1639 Last data filed at 12/05/2023 1300 Gross per 24 hour  Intake 110 ml  Output --  Net 110 ml   Filed Weights   12/03/23 2102 12/04/23 0615 12/05/23 0510  Weight: 59.4 kg 61.8 kg 62.5 kg    Examination:  General exam: NAD Respiratory system: CTA Cardiovascular system: S 1, S 2 RRR Gastrointestinal system: BS present, soft nt Central nervous system: Alert Extremities: Status post left BKA wound VAC in place   Data Reviewed: I have personally reviewed following labs and imaging studies  CBC: Recent Labs  Lab 11/29/23 0520 11/30/23 1038 12/01/23 0830 12/02/23 1823 12/03/23 0255  WBC 14.0* 12.9* 12.9* 11.7* 11.7*  NEUTROABS 10.7* 10.2*  --   --   --   HGB 12.6 12.6 10.4* 12.8 11.6*  HCT 38.9 39.8 33.3* 39.9 36.7  MCV 75.5* 76.2* 76.6* 76.3* 76.5*  PLT 347  347 388 330 380   Basic Metabolic Panel: Recent Labs  Lab 12/01/23 0830 12/02/23 1823  NA 136 134*  K 4.0 3.8  CL 98 92*  CO2 24 27  GLUCOSE 82 91  BUN 26* 27*  CREATININE 3.28* 3.04*  CALCIUM  7.7* 8.5*  PHOS 2.6  --    GFR: Estimated Creatinine Clearance: 16.9 mL/min (A) (by C-G formula based on SCr of 3.04 mg/dL (H)). Liver Function Tests: Recent Labs  Lab 12/01/23 0830  ALBUMIN  2.0*   No results for input(s): "LIPASE", "AMYLASE" in the last 168 hours. Recent Labs  Lab 11/28/23 1846  AMMONIA 13   Coagulation Profile: No results for input(s): "INR", "PROTIME" in the last 168 hours. Cardiac Enzymes: No results for input(s): "CKTOTAL", "CKMB", "CKMBINDEX", "TROPONINI" in the last 168 hours. BNP (last 3 results) No results for input(s): "PROBNP" in the last 8760 hours. HbA1C: No results for input(s): "HGBA1C" in the last 72 hours. CBG: Recent Labs  Lab 12/04/23 1536 12/04/23 2117 12/05/23 0605 12/05/23 1221 12/05/23 1621  GLUCAP 109* 116* 107* 106* 103*   Lipid Profile: No results for input(s): "CHOL", "HDL", "LDLCALC", "TRIG", "CHOLHDL", "LDLDIRECT" in the last  72 hours. Thyroid  Function Tests: No results for input(s): "TSH", "T4TOTAL", "FREET4", "T3FREE", "THYROIDAB" in the last 72 hours. Anemia Panel: No results for input(s): "VITAMINB12", "FOLATE", "FERRITIN", "TIBC", "IRON ", "RETICCTPCT" in the last 72 hours. Sepsis Labs: No results for input(s): "PROCALCITON", "LATICACIDVEN" in the last 168 hours.  Recent Results (from the past 240 hours)  Culture, blood (Routine X 2) w Reflex to ID Panel     Status: None   Collection Time: 11/28/23  7:18 PM   Specimen: BLOOD LEFT HAND  Result Value Ref Range Status   Specimen Description BLOOD LEFT HAND  Final   Special Requests   Final    BOTTLES DRAWN AEROBIC ONLY Blood Culture results may not be optimal due to an inadequate volume of blood received in culture bottles   Culture   Final    NO GROWTH 5  DAYS Performed at [facility], [address] [place], [place] [address]    Report Status 12/03/2023 FINAL  Final  Culture, blood (Routine X 2) w Reflex to ID Panel     Status: None   Collection Time: 11/28/23  7:18 PM   Specimen: BLOOD LEFT HAND  Result Value Ref Range Status   Specimen Description BLOOD LEFT HAND  Final   Special Requests   Final    AEROBIC BOTTLE ONLY Blood Culture results may not be optimal due to an inadequate volume of blood received in culture bottles   Culture   Final    NO GROWTH 5 DAYS Performed at [facility], [address], [place], [place] [address]    Report Status 12/03/2023 FINAL  Final         Radiology Studies: No results found.       Scheduled Meds:  amLODipine   5 mg Oral QPM   apixaban   5 mg Oral BID   vitamin C   1,000 mg Oral Daily   atorvastatin   20 mg Oral Daily   calcitRIOL   0.25 mcg Oral Q T,Th,Sa-HD   calcium  carbonate  1 tablet Oral TID WC   Chlorhexidine  Gluconate Cloth  6 each Topical Q0600   cloNIDine   0.1 mg Transdermal Weekly   insulin  aspart  0-5 Units Subcutaneous QHS   insulin  aspart  0-6 Units Subcutaneous TID WC   metoprolol  tartrate  50 mg Oral BID   nutrition supplement (JUVEN)  1 packet Oral BID BM   sodium chloride  flush  10-40 mL Intracatheter Q12H   thiamine   100 mg Oral Daily   zinc  sulfate (50mg  elemental zinc )  220 mg Oral Daily   Continuous Infusions:  sodium chloride        LOS: 14 days    Time spent: 35 minutes    Danyell Shader A Dalaysia Harms, MD [facility]   If 7PM-7AM, please contact night-coverage [email or web address]  12/05/2023, 4:39 PM

## 2023-12-05 NOTE — Plan of Care (Signed)
 Problem: Education: Goal: Knowledge of General Education information will improve Description: Including pain rating scale, medication(s)/side effects and non-pharmacologic comfort measures Outcome: Progressing   Problem: Health Behavior/Discharge Planning: Goal: Ability to manage health-related needs will improve Outcome: Progressing   Problem: Clinical Measurements: Goal: Ability to maintain clinical measurements within normal limits will improve Outcome: Progressing Goal: Will remain free from infection Outcome: Progressing Goal: Diagnostic test results will improve Outcome: Progressing Goal: Respiratory complications will improve Outcome: Progressing Goal: Cardiovascular complication will be avoided Outcome: Progressing   Problem: Activity: Goal: Risk for activity intolerance will decrease Outcome: Progressing   Problem: Nutrition: Goal: Adequate nutrition will be maintained Outcome: Progressing   Problem: Coping: Goal: Level of anxiety will decrease Outcome: Progressing   Problem: Elimination: Goal: Will not experience complications related to bowel motility Outcome: Progressing Goal: Will not experience complications related to urinary retention Outcome: Progressing   Problem: Pain Managment: Goal: General experience of comfort will improve and/or be controlled Outcome: Progressing   Problem: Safety: Goal: Ability to remain free from injury will improve Outcome: Progressing   Problem: Skin Integrity: Goal: Risk for impaired skin integrity will decrease Outcome: Progressing   Problem: Education: Goal: Ability to describe self-care measures that may prevent or decrease complications (Diabetes Survival Skills Education) will improve Outcome: Progressing Goal: Individualized Educational Video(s) Outcome: Progressing   Problem: Coping: Goal: Ability to adjust to condition or change in health will improve Outcome: Progressing   Problem: Fluid  Volume: Goal: Ability to maintain a balanced intake and output will improve Outcome: Progressing   Problem: Health Behavior/Discharge Planning: Goal: Ability to identify and utilize available resources and services will improve Outcome: Progressing Goal: Ability to manage health-related needs will improve Outcome: Progressing   Problem: Metabolic: Goal: Ability to maintain appropriate glucose levels will improve Outcome: Progressing   Problem: Nutritional: Goal: Maintenance of adequate nutrition will improve Outcome: Progressing Goal: Progress toward achieving an optimal weight will improve Outcome: Progressing   Problem: Skin Integrity: Goal: Risk for impaired skin integrity will decrease Outcome: Progressing   Problem: Tissue Perfusion: Goal: Adequacy of tissue perfusion will improve Outcome: Progressing   Problem: Education: Goal: Knowledge of the prescribed therapeutic regimen will improve Outcome: Progressing Goal: Ability to verbalize activity precautions or restrictions will improve Outcome: Progressing Goal: Understanding of discharge needs will improve Outcome: Progressing   Problem: Activity: Goal: Ability to perform//tolerate increased activity and mobilize with assistive devices will improve Outcome: Progressing   Problem: Clinical Measurements: Goal: Postoperative complications will be avoided or minimized Outcome: Progressing   Problem: Self-Care: Goal: Ability to meet self-care needs will improve Outcome: Progressing   Problem: Self-Concept: Goal: Ability to maintain and perform role responsibilities to the fullest extent possible will improve Outcome: Progressing   Problem: Pain Management: Goal: Pain level will decrease with appropriate interventions Outcome: Progressing   Problem: Skin Integrity: Goal: Demonstration of wound healing without infection will improve Outcome: Progressing   Problem: Education: Goal: Knowledge of disease or  condition will improve Outcome: Progressing Goal: Knowledge of secondary prevention will improve (MUST DOCUMENT ALL) Outcome: Progressing Goal: Knowledge of patient specific risk factors will improve (DELETE if not current risk factor) Outcome: Progressing   Problem: Ischemic Stroke/TIA Tissue Perfusion: Goal: Complications of ischemic stroke/TIA will be minimized Outcome: Progressing   Problem: Coping: Goal: Will verbalize positive feelings about self Outcome: Progressing Goal: Will identify appropriate support needs Outcome: Progressing   Problem: Health Behavior/Discharge Planning: Goal: Ability to manage health-related needs will improve Outcome: Progressing Goal: Goals will  be collaboratively established with patient/family Outcome: Progressing   Problem: Self-Care: Goal: Ability to participate in self-care as condition permits will improve Outcome: Progressing Goal: Verbalization of feelings and concerns over difficulty with self-care will improve Outcome: Progressing Goal: Ability to communicate needs accurately will improve Outcome: Progressing

## 2023-12-05 NOTE — Plan of Care (Signed)
  Problem: Ischemic Stroke/TIA Tissue Perfusion: Goal: Complications of ischemic stroke/TIA will be minimized Outcome: Progressing   Problem: Education: Goal: Knowledge of disease or condition will improve Outcome: Not Progressing Goal: Knowledge of secondary prevention will improve (MUST DOCUMENT ALL) Outcome: Not Progressing Goal: Knowledge of patient specific risk factors will improve (DELETE if not current risk factor) Outcome: Not Progressing   Problem: Coping: Goal: Will verbalize positive feelings about self Outcome: Not Progressing Goal: Will identify appropriate support needs Outcome: Not Progressing

## 2023-12-06 DIAGNOSIS — L03116 Cellulitis of left lower limb: Secondary | ICD-10-CM | POA: Diagnosis not present

## 2023-12-06 LAB — GLUCOSE, CAPILLARY
Glucose-Capillary: 137 mg/dL — ABNORMAL HIGH (ref 70–99)
Glucose-Capillary: 129 mg/dL — ABNORMAL HIGH (ref 70–99)
Glucose-Capillary: 152 mg/dL — ABNORMAL HIGH (ref 70–99)

## 2023-12-06 LAB — RENAL FUNCTION PANEL
Albumin: 2.3 g/dL — ABNORMAL LOW (ref 3.5–5.0)
Anion gap: 10 (ref 5–15)
BUN: 26 mg/dL — ABNORMAL HIGH (ref 6–20)
CO2: 26 mmol/L (ref 22–32)
Calcium: 8.1 mg/dL — ABNORMAL LOW (ref 8.9–10.3)
Chloride: 95 mmol/L — ABNORMAL LOW (ref 98–111)
Creatinine, Ser: 3.93 mg/dL — ABNORMAL HIGH (ref 0.44–1.00)
GFR, Estimated: 13 mL/min — ABNORMAL LOW (ref >60)
Glucose, Bld: 117 mg/dL — ABNORMAL HIGH (ref 70–99)
Phosphorus: 2.8 mg/dL (ref 2.5–4.6)
Potassium: 4.6 mmol/L (ref 3.5–5.1)
Sodium: 131 mmol/L — ABNORMAL LOW (ref 135–145)

## 2023-12-06 LAB — CBC
HCT: 36.2 % (ref 36.0–46.0)
Hemoglobin: 11.3 g/dL — ABNORMAL LOW (ref 12.0–15.0)
MCH: 23.9 pg — ABNORMAL LOW (ref 26.0–34.0)
MCHC: 31.2 g/dL (ref 30.0–36.0)
MCV: 76.7 fL — ABNORMAL LOW (ref 80.0–100.0)
Platelets: 418 10*3/uL — ABNORMAL HIGH (ref 150–400)
RBC: 4.72 MIL/uL (ref 3.87–5.11)
RDW: 28.4 % — ABNORMAL HIGH (ref 11.5–15.5)
WBC: 11.5 10*3/uL — ABNORMAL HIGH (ref 4.0–10.5)
nRBC: 0 % (ref 0.0–0.2)

## 2023-12-06 MED ORDER — HEPARIN SODIUM (PORCINE) 1000 UNIT/ML DIALYSIS: 1000.0000 [IU] | INTRAMUSCULAR | Status: DC | PRN

## 2023-12-06 MED ORDER — HYDROMORPHONE HCL 2 MG PO TABS
1.0000 mg | ORAL_TABLET | ORAL | Status: DC | PRN
  Administered 2023-12-06 (×2): 1 mg via ORAL
  Filled 2023-12-06 (×2): qty 1

## 2023-12-06 MED ORDER — ANTICOAGULANT SODIUM CITRATE 4% (200MG/5ML) IV SOLN: 5.0000 mL | Status: DC | PRN

## 2023-12-06 MED ORDER — ATORVASTATIN CALCIUM 20 MG PO TABS
20.0000 mg | ORAL_TABLET | Freq: Every day | ORAL | 1 refills | Status: DC
Start: 2023-12-06 — End: 2024-02-19

## 2023-12-06 MED ORDER — NEPRO/CARBSTEADY PO LIQD: 237.0000 mL | ORAL | Status: DC | PRN

## 2023-12-06 MED ORDER — HYDROMORPHONE HCL 2 MG PO TABS
1.0000 mg | ORAL_TABLET | Freq: Once | ORAL | Status: AC
  Administered 2023-12-06: 1 mg via ORAL
  Filled 2023-12-06: qty 1

## 2023-12-06 MED ORDER — HYDROMORPHONE HCL 2 MG PO TABS: 1.0000 mg | ORAL_TABLET | Freq: Four times a day (QID) | ORAL | 0 refills | Status: AC | PRN

## 2023-12-06 MED ORDER — CALCIUM CARBONATE ANTACID 500 MG PO CHEW: 1.0000 | CHEWABLE_TABLET | Freq: Three times a day (TID) | ORAL | 0 refills | Status: DC

## 2023-12-06 MED ORDER — HEPARIN SODIUM (PORCINE) 1000 UNIT/ML DIALYSIS
3000.0000 [IU] | Freq: Once | INTRAMUSCULAR | Status: AC
  Administered 2023-12-06: 3000 [IU] via INTRAVENOUS_CENTRAL

## 2023-12-06 MED ORDER — ALTEPLASE 2 MG IJ SOLR: 2.0000 mg | Freq: Once | INTRAMUSCULAR | Status: DC | PRN

## 2023-12-06 MED ORDER — HEPARIN SODIUM (PORCINE) 1000 UNIT/ML DIALYSIS: 2000.0000 [IU] | INTRAMUSCULAR | Status: DC | PRN

## 2023-12-06 MED ORDER — MEDIHONEY WOUND/BURN DRESSING EX PSTE
1.0000 | PASTE | Freq: Every day | CUTANEOUS | Status: DC
  Filled 2023-12-06: qty 44

## 2023-12-06 MED ORDER — CALCITRIOL 0.25 MCG PO CAPS: 0.2500 ug | ORAL_CAPSULE | ORAL | 0 refills | Status: DC

## 2023-12-06 MED ORDER — LIDOCAINE-PRILOCAINE 2.5-2.5 % EX CREA
1.0000 | TOPICAL_CREAM | CUTANEOUS | Status: DC | PRN
Start: 2023-12-06 — End: 2023-12-06

## 2023-12-06 MED ORDER — HEPARIN SODIUM (PORCINE) 1000 UNIT/ML IJ SOLN
INTRAMUSCULAR | Status: AC
  Filled 2023-12-06: qty 3

## 2023-12-06 MED ORDER — PENTAFLUOROPROP-TETRAFLUOROETH EX AERO: 1.0000 | INHALATION_SPRAY | CUTANEOUS | Status: DC | PRN

## 2023-12-06 MED ORDER — METOPROLOL TARTRATE 50 MG PO TABS
50.0000 mg | ORAL_TABLET | Freq: Two times a day (BID) | ORAL | 0 refills | Status: DC
Start: 2023-12-06 — End: 2024-02-19

## 2023-12-06 MED ORDER — HEPARIN SODIUM (PORCINE) 1000 UNIT/ML IJ SOLN
INTRAMUSCULAR | Status: AC
  Filled 2023-12-06: qty 4

## 2023-12-06 MED ORDER — ZINC SULFATE 220 (50 ZN) MG PO CAPS: 220.0000 mg | ORAL_CAPSULE | Freq: Every day | ORAL | 0 refills | Status: DC

## 2023-12-06 MED ORDER — VITAMIN B-1 100 MG PO TABS: 100.0000 mg | ORAL_TABLET | Freq: Every day | ORAL | 0 refills | Status: DC

## 2023-12-06 MED ORDER — HYDROMORPHONE HCL 2 MG PO TABS
ORAL_TABLET | ORAL | Status: AC
Start: 2023-12-06 — End: ?
  Filled 2023-12-06: qty 1

## 2023-12-06 MED ORDER — ASCORBIC ACID 1000 MG PO TABS: 1000.0000 mg | ORAL_TABLET | Freq: Every day | ORAL | 0 refills | Status: DC

## 2023-12-06 MED ORDER — LIDOCAINE HCL (PF) 1 % IJ SOLN: 5.0000 mL | INTRAMUSCULAR | Status: DC | PRN

## 2023-12-06 MED ORDER — AMLODIPINE BESYLATE 5 MG PO TABS
5.0000 mg | ORAL_TABLET | Freq: Every evening | ORAL | 0 refills | Status: DC
Start: 2023-12-06 — End: 2024-02-19

## 2023-12-06 MED ORDER — GERHARDT'S BUTT CREAM
TOPICAL_CREAM | Freq: Two times a day (BID) | CUTANEOUS | Status: DC
  Filled 2023-12-06: qty 60

## 2023-12-06 NOTE — Evaluation (Signed)
 Physical Therapy Evaluation Patient Details Name: [name] [name] MRN: [id number] DOB: 04/02/1966 Today's Date: 12/06/2023  History of Present Illness  Pt is a 58 y.o. female who presented 11/21/23 from SNF with L heel wound. MRI showed  abnormal edema posteriorly and medially in the calcaneus compatible with osteomyelitis, marrow edema in the talar neck and adjacent talar body potentially from stress fracture or osteomyelitis, and stress fracture of the proximal metaphysis of the first metatarsal with associated low-grade edema throughout the shaft of the first metatarsal. 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 PMH: ESRD on HD, syncope and collapse, diabetic neuropathy, mitral regurgitation, HTN, heart murmur, colitis, and DM2, L eye blindness.   Clinical Impression  Pt in bed upon arrival of PT, agreeable to evaluation at this time. Prior to admission the pt was dependent on use of lift and either power WC or manual WC for all mobility. Pt presents with limited strength and ROM in all extremities, limited in LLE due to pain, RLE due to weakness, and impaired ROM and strength in UE. Pt needing assist to complete roll and unable to tolerate well due to pain in LLE despite assist to support with pillow. Pt unable to tolerate ROM at hip and demos only trace activation for knee extension and hip abd/add. Pt completing 2 reps of each movement only despite encouragement due to reports of pain. Pt reports she had been active with PT prior to admission, working on sitting EOB and balance, is agreeable to continue PT to progress LLE strength, ROM, and strategies for management of phantom pain. Will continue to follow acutely until pt is better able to complete roll and LE exercises independently. Recommend post-acute rehab <3hours/day.   Pt with sacral wound, may benefit from new WC cushion (roho) to further manage pressure after d/c.     If plan is discharge home, recommend the following: Two people to help with walking and/or  transfers;Two people to help with bathing/dressing/bathroom;Assistance with cooking/housework;Direct supervision/assist for medications management;Direct supervision/assist for financial management;Assist for transportation;Help with stairs or ramp for entrance   Can travel by private vehicle   No    Equipment Recommendations Other (comment);Wheelchair (measurements PT);Wheelchair cushion (measurements PT);Hoyer lift;Hospital bed (air mattress overlay; roho cushion for w/c)  Recommendations for Other Services       Functional Status Assessment Patient has had a recent decline in their functional status and demonstrates the ability to make significant improvements in function in a reasonable and predictable amount of time.     Precautions / Restrictions Precautions Precautions: Fall;Other (comment) Recall of Precautions/Restrictions: Intact Precaution/Restrictions Comments: L eye blind, limited vision R eye, sacral wound, LLE wound vac Restrictions Weight Bearing Restrictions Per Provider Order: Yes LLE Weight Bearing Per Provider Order: Non weight bearing      Mobility  Bed Mobility Overal bed mobility: Needs Assistance Bed Mobility: Rolling Rolling: Max assist, Used rails         General bed mobility comments: Max A to roll x 2 (once to attempt hip extension exercises and once with wound care entry to check skin). Unable to hold self on side with bedrails. Pt declined EOB attempts    Transfers                   General transfer comment: declined; had just lifted back to bed from HD chair    Ambulation/Gait               General Gait Details: N/A, nonambulatory at baseline  Pertinent Vitals/Pain Pain Assessment Pain Assessment: Faces Faces Pain Scale: Hurts even more Pain Location: L residual limb with movement Pain Descriptors / Indicators: Discomfort, Guarding, Grimacing, Sore Pain Intervention(s): Limited activity within patient's tolerance,  Monitored during session, Repositioned    Home Living Family/patient expects to be discharged to:: Skilled nursing facility                   Additional Comments: LTC resident at [facility], reports she was active with therapy there PTA    Prior Function Prior Level of Function : Needs assist             Mobility Comments: Has not stood up or walked in months, has primarily been bedbound at SNF, reporting she does not remember that last time she was OOB but she had been transferring via hoyer lift recently. reports previously working on sitting EOB with therapy at facility ADLs Comments: anticipate bed level assist for all bathing, dressing and toileting. Setup for self feeding and grooming     Extremity/Trunk Assessment   Upper Extremity Assessment Upper Extremity Assessment: Defer to OT evaluation    Lower Extremity Assessment Lower Extremity Assessment: RLE deficits/detail;LLE deficits/detail RLE Deficits / Details: pt minimally if at all activating muscles in R leg today despite being awake and participatory, pt states she is unable. trace activation noted in hip and knee, reports pain in foot with attempts at PROM. noted PF contracture RLE: Unable to fully assess due to pain LLE Deficits / Details: L BKA 5/23; able to actively flex and extend L knee slightly when cued; able to achieve full knee extension AAROM; only achieves ~10' knee flexion AAROM, limited by pain LLE: Unable to fully assess due to pain    Cervical / Trunk Assessment Cervical / Trunk Assessment: Normal  Communication   Communication Communication: No apparent difficulties    Cognition Arousal: Alert Behavior During Therapy: WFL for tasks assessed/performed   PT - Cognitive impairments: No family/caregiver present to determine baseline                       PT - Cognition Comments: pt with slow processing and difficulty problem solving. At times with poor insight to condition and  resistant to attempts at education. does seem more engaged and motivated this session Following commands: Impaired Following commands impaired: Only follows one step commands consistently, Follows one step commands with increased time, Follows multi-step commands inconsistently     Cueing Cueing Techniques: Verbal cues, Tactile cues     General Comments General comments (skin integrity, edema, etc.): pt reporting phantom pain, attemtped to educate on management strategies. pt reports "that is a lie, it did not stop the pain"    Exercises Other Exercises Other Exercises: truncal rotation reaching side to side bedrails Other Exercises: attempted sidelying hip extension (reported unable d/t pain) Other Exercises: amputee exercises L residual limb (pt unable to tolerate knee flexion today)   Assessment/Plan    PT Assessment Patient needs continued PT services  PT Problem List Decreased strength;Decreased range of motion;Decreased activity tolerance;Decreased balance;Decreased mobility;Decreased cognition;Decreased skin integrity;Pain       PT Treatment Interventions DME instruction;Functional mobility training;Therapeutic activities;Therapeutic exercise;Balance training;Neuromuscular re-education;Patient/family education;Wheelchair mobility training;Cognitive remediation    PT Goals (Current goals can be found in the Care Plan section)  Acute Rehab PT Goals Patient Stated Goal: to reduce pain PT Goal Formulation: With patient Time For Goal Achievement: 12/20/23 Potential to Achieve Goals: Fair  Frequency Min 1X/week     Co-evaluation   Reason for Co-Treatment: For patient/therapist safety;To address functional/ADL transfers;Other (comment) (limited activity tolerance) PT goals addressed during session: Strengthening/ROM OT goals addressed during session: ADL's and self-care;Strengthening/ROM       AM-PAC PT "6 Clicks" Mobility  Outcome Measure Help needed turning from your  back to your side while in a flat bed without using bedrails?: A Lot Help needed moving from lying on your back to sitting on the side of a flat bed without using bedrails?: Total Help needed moving to and from a bed to a chair (including a wheelchair)?: Total Help needed standing up from a chair using your arms (e.g., wheelchair or bedside chair)?: Total Help needed to walk in hospital room?: Total Help needed climbing 3-5 steps with a railing? : Total 6 Click Score: 7    End of Session   Activity Tolerance: Patient limited by pain Patient left: in bed;with call bell/phone within reach;with bed alarm set Nurse Communication: Mobility status;Need for lift equipment PT Visit Diagnosis: Muscle weakness (generalized) (M62.81);Pain Pain - Right/Left: Left Pain - part of body: Leg    Time: 1610-9604 PT Time Calculation (min) (ACUTE ONLY): 27 min   Charges:   PT Evaluation $PT Eval Moderate Complexity: 1 Mod   PT General Charges $$ ACUTE PT VISIT: 1 Visit         Barnabas Booth, PT, DPT   Acute Rehabilitation Department Office [phone] Secure Chat Communication Preferred  Lona Rist 12/06/2023, 2:12 PM

## 2023-12-06 NOTE — Consult Note (Signed)
 WOC Nurse wound follow up Wound type: unstageable  PI on coccyx  Measurement: see previous note Wound bed: 100% covered with slough and eschar Drainage (amount, consistency, odor) minimal, no odor Periwound: erythema, MASD Dressing procedure/placement/frequency: Cleanse wound with Vashe, let air dry, apply medi honey, and cover with foam dressing, change daily.  Gerhardt cream to perineum for MASD BID and PRN soiling  WOC team will continue to follow patient weekly, please re-consult if new needs arise.  Gillermo Lack, RN, MSN, [facility] WOC Team

## 2023-12-06 NOTE — Procedures (Signed)
 Received patient in bed to unit.  Alert and oriented.  Informed consent signed and in chart.   TX duration: 3.25 hours  Patient tolerated well.  Transported back to the room  Alert, without acute distress.  Hand-off given to patient's nurse.   Access used: right catheter Access issues: none  Total UF removed: 1.8 liters Medication(s) given: dilaudid , heparin   Clover Dao, RN [facility]

## 2023-12-06 NOTE — Progress Notes (Signed)
 SLP Cancellation Note  Patient Details Name: [name] [name] MRN: [id number] DOB: 09/18/1965   Cancelled treatment:       Reason Eval/Treat Not Completed: Patient at procedure or test/unavailable. Unable to see for cognitive treatment. Pt currently in HD. Will follow along.    [place], [place] 12/06/2023, 9:11 AM

## 2023-12-06 NOTE — Evaluation (Signed)
 Occupational Therapy Evaluation Patient Details Name: [name] [name] MRN: [id number] DOB: [birth year] Today's Date: 12/06/2023   History of Present Illness   Pt is a 58 y.o. female who presented 11/21/23 from SNF with L heel wound. MRI showed  abnormal edema posteriorly and medially in the calcaneus compatible with osteomyelitis, marrow edema in the talar neck and adjacent talar body potentially from stress fracture or osteomyelitis, and stress fracture of the proximal metaphysis of the first metatarsal with associated low-grade edema throughout the shaft of the first metatarsal. 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 PMH: ESRD on HD, syncope and collapse, diabetic neuropathy, mitral regurgitation, HTN, heart murmur, colitis, and DM2, L eye blindness.     Clinical Impressions Pt presents with cognitive improvements from initial OT eval though remains limited by LLE pain, endurance deficits and impaired intrinsic motivation. Pt requiring Max A to roll side to side in bed, declined EOB attempts today but agreeable to attempt this on a non-HD day. Pt requiring Mod A for UB ADL and Total A for LB ADLs bed level. Instructed in therapeutic exercises to complete outside of therapy sessions and provided level 1 theraband to improve UB strength. Plan to further address UE HEP in next session. Will follow distantly at an acute level as pt near baseline for ADLs/transfers.      If plan is discharge home, recommend the following:   A lot of help with walking and/or transfers;Two people to help with walking and/or transfers;A lot of help with bathing/dressing/bathroom;Two people to help with bathing/dressing/bathroom;Assistance with feeding     Functional Status Assessment   Patient has had a recent decline in their functional status and demonstrates the ability to make significant improvements in function in a reasonable and predictable amount of time.     Equipment Recommendations   None recommended by OT      Recommendations for Other Services         Precautions/Restrictions   Precautions Precautions: Fall;Other (comment) Precaution/Restrictions Comments: L eye blind, limited vision R eye, sacral wound, LLE wound vac Restrictions Weight Bearing Restrictions Per Provider Order: Yes LLE Weight Bearing Per Provider Order: Non weight bearing     Mobility Bed Mobility Overal bed mobility: Needs Assistance Bed Mobility: Rolling Rolling: Max assist, Used rails         General bed mobility comments: Max A to roll x 2 (once to attempt hip extension exercises and once with wound care entry to check skin). Unable to hold self on side with bedrails. Pt declined EOB attempts    Transfers                   General transfer comment: declined; had just lifted back to bed from HD chair      Balance                                           ADL either performed or assessed with clinical judgement   ADL Overall ADL's : Needs assistance/impaired Eating/Feeding: Bed level;Set up Eating/Feeding Details (indicate cue type and reason): assist to locate items, open some smaller containers. pt reported able to self feed despite vision. able to grasp cut up sandwich pieces, place mayo on individual pieces and eat them Grooming: Minimal assistance;Bed level   Upper Body Bathing: Moderate assistance;Bed level   Lower Body Bathing: Total assistance;Bed level   Upper Body Dressing :  Moderate assistance;Bed level   Lower Body Dressing: Total assistance;Bed level       Toileting- Clothing Manipulation and Hygiene: Total assistance;Bed level         General ADL Comments: Educated on various residual limb exercises, provided theraband for UE strengthening (plan to further educate in next session), barriers to progress and complications from limited OOB participation. Pt initially reporting plan to work with therapy but then declined EOB attempts d/t pain      Vision Baseline Vision/History: 2 Legally blind Ability to See in Adequate Light: 3 Highly impaired Patient Visual Report: No change from baseline Vision Assessment?: Vision impaired- to be further tested in functional context Additional Comments: L eye blind, reports able to see blurs, shadows in R eye     Perception         Praxis         Pertinent Vitals/Pain Pain Assessment Pain Assessment: Faces Faces Pain Scale: Hurts even more Pain Location: L residual limb with movement Pain Descriptors / Indicators: Discomfort, Guarding, Grimacing, Sore Pain Intervention(s): Monitored during session, Limited activity within patient's tolerance     Extremity/Trunk Assessment Upper Extremity Assessment Upper Extremity Assessment: Generalized weakness;Right hand dominant   Lower Extremity Assessment Lower Extremity Assessment: Defer to PT evaluation   Cervical / Trunk Assessment Cervical / Trunk Assessment: Normal   Communication Communication Communication: No apparent difficulties   Cognition Arousal: Alert Behavior During Therapy: WFL for tasks assessed/performed Cognition: No family/caregiver present to determine baseline             OT - Cognition Comments: cognition improved from initial eval. pt joking, appropriate in conversation though decreased understanding for therapy engagement                 Following commands: Impaired Following commands impaired: Only follows one step commands consistently, Follows one step commands with increased time, Follows multi-step commands inconsistently     Cueing  General Comments   Cueing Techniques: Verbal cues;Tactile cues      Exercises Exercises: Other exercises Other Exercises Other Exercises: truncal rotation reaching side to side bedrails Other Exercises: attempted sidelying hip extension (reported unable d/t pain) Other Exercises: amputee exercises L residual limb (pt unable to tolerate knee flexion  today)   Shoulder Instructions      Home Living Family/patient expects to be discharged to:: Skilled nursing facility                                 Additional Comments: LTC resident at [facility]      Prior Functioning/Environment Prior Level of Function : Needs assist             Mobility Comments: Has not stood up or walked in months, has primarily been bedbound at SNF, reporting she does not remember that last time she was OOB but she had been transferring via hoyer lift recently. reports previously working on sitting EOB with therapy at facility ADLs Comments: anticipate bed level assist for all bathing, dressing and toileting. Setup for self feeding and grooming    OT Problem List: Decreased strength;Decreased activity tolerance;Impaired balance (sitting and/or standing);Decreased cognition;Decreased safety awareness;Impaired vision/perception;Decreased knowledge of use of DME or AE;Pain   OT Treatment/Interventions: Self-care/ADL training;Therapeutic exercise;Energy conservation;DME and/or AE instruction;Therapeutic activities;Patient/family education;Balance training      OT Goals(Current goals can be found in the care plan section)   Acute Rehab OT Goals Patient Stated Goal:  pain control OT Goal Formulation: With patient Time For Goal Achievement: 12/20/23 Potential to Achieve Goals: Fair ADL Goals Pt/caregiver will Perform Home Exercise Program: Increased strength;Both right and left upper extremity;With theraband;With Supervision Additional ADL Goal #1: Pt to demo ability to roll side to side with Min A during LB ADLs Additional ADL Goal #2: Pt to demo ability to sit EOB > 1 min with no more than Min A for sitting balance during ADLs/functional tasks   OT Frequency:  Min 1X/week    Co-evaluation PT/OT/SLP Co-Evaluation/Treatment: Yes Reason for Co-Treatment: For patient/therapist safety;To address functional/ADL transfers   OT goals  addressed during session: ADL's and self-care;Strengthening/ROM      AM-PAC OT "6 Clicks" Daily Activity     Outcome Measure Help from another person eating meals?: A Little Help from another person taking care of personal grooming?: A Little Help from another person toileting, which includes using toliet, bedpan, or urinal?: Total Help from another person bathing (including washing, rinsing, drying)?: A Lot Help from another person to put on and taking off regular upper body clothing?: A Lot Help from another person to put on and taking off regular lower body clothing?: Total 6 Click Score: 12   End of Session    Activity Tolerance: Other (comment);Patient limited by fatigue (self limiting) Patient left: in bed;with call bell/phone within reach;with bed alarm set  OT Visit Diagnosis: Other abnormalities of gait and mobility (R26.89);Muscle weakness (generalized) (M62.81);Other symptoms and signs involving cognitive function                Time: 1308-6578 OT Time Calculation (min): 33 min Charges:  OT General Charges $OT Visit: 1 Visit OT Evaluation $OT Eval Moderate Complexity: 1 Mod  Lawrence Pretty, OTR/L Acute Rehab Services Office: [phone]   Shireen Dory 12/06/2023, 1:59 PM

## 2023-12-06 NOTE — Progress Notes (Signed)
 Pt discharging back to [facility]. Spoke to Dr Julio Ohm about her vac dressing. He stated to remove the wound vac and place a dry dressing over incisional site. Wound vac removed, and dry dressing placed per verbal order.

## 2023-12-06 NOTE — Discharge Summary (Signed)
 Physician Discharge Summary   Patient: [name] [name] MRN: [id number] DOB: Mar 30, 1966  Admit date:     11/21/2023  Discharge date: 12/06/23  Discharge Physician: Danette Duos   PCP: Alwyn Juba., MD   Recommendations at discharge:   Needs to follow-up with Dr. Julio Ohm 1 or 2 weeks postdischarge - Wound VAC will be removed at discharge, patient will need dry dressing changes. Continue with hemodialysis Needs to follow-up with neurology for post stroke admission   Discharge Diagnoses: Principal Problem:   Cellulitis of left lower extremity Active Problems:   Diabetic neuropathy (HCC)   Essential hypertension   GERD (gastroesophageal reflux disease)   ESRD (end stage renal disease) (HCC)   DM2 (diabetes mellitus, type 2) (HCC)   Anemia due to chronic kidney disease   Osteomyelitis of left foot (HCC)  Resolved Problems:   * No resolved hospital problems. *  Hospital Course: 58 year old African-American female with past medical history significant for ESRD on hemodialysis TTS, GERD, hypertension, left eye blindness, diabetes type 2 history of lumbar radiculopathy presents from SNF from [place] [facility] with left heel wound and cellulitis and suspicious for possible gas-forming infection.  She had been undergoing wound care treatment but did state that the foot has gotten worse.  Workup in the ED showed wound infection and cellulitis and concern for deep infection.  MRI confirm osteomyelitis.  Dr. 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   Hospital course complicated by multifocal stroke, depression catatonia.  Anemia presented with a hemoglobin of 6, since blood transfusion Hb has remain stable.    Assessment and Plan: 1-Infected Diabetic Foot ulcer with Cellulitis/gangrene and gas-forming infection of the left heel/osteomyelitis:  -Patient presented with unstageable pressure heel left ulcer with a wound bed  black eschar.  Followed by the wound clinic, but was not getting better. - Blood cultures no growth today. - MRI confirm osteomyelitis. - Dr. Julio Ohm consulted and recommended left BKA which patient agreed after lengthy discussion. 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 Discharge on short course if dilaudid .     Multifocal stroke: - Post surgery, patient had intermittent somnolence and not having proper conversation.  It was thought that patient was severely depressed, appear patient was making some effort with she could not talk, could not follow commands.  Stroke was suspected and MRI was obtained which confirmed multifocal ischemic stroke. - Neurology consulted, CTA head and neck negative for large vessel occlusion.  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 - Back on Eliquis  since 5/28. - LDL 93, continue atorvastatin . - Patient mental status improving.  Need to avoid hypotension. Alert and conversant.    Depression/catatonia - Patient with unwillingness or inability to talk or refusal of lab and medical care, psychiatry was consulted and at some point it was suspected that she had some catatonia, she received Ativan  and her symptoms improve. Have some intermittent symptoms. Ativan  as needed.  Patient improved . Has not required Ativan .    Diabetes type 2 Diabetic neuropathy retinopathy - Continue sliding scale insulin .  Type she has been refusing insulin    Hyponatremia - Mild, correction with hemodialysis   Hypertension - Continue Norvasc , metoprolol  and clonidine   patch   ESRD on hemodialysis TTS - Continue with hemodialysis -She was able to tolerates HD on chair.   Symptomatic anemia/anemia of chronic kidney disease - Hemoglobin  on admission was 6.1, received 1 unit of packed red blood cell on admission.  Subsequently hemoglobin dropped again and received 2 more units of packed red blood  cell. 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 Subcutaneous fat may be visible but bone, tendon or muscle are NOT exposed. (Active)  11/28/23 0931  Location: Sacrum  Location Orientation:   Staging: Stage 3 -  Full thickness tissue loss. Subcutaneous fat may be visible but bone, tendon or muscle are NOT exposed.  Wound Description (Comments):   Present on Admission:   Dressing Type Foam - Lift dressing to assess site every shift 12/05/23 1500        Estimated body mass index is 26.91 kg/m as calculated from the following:   Height as of this encounter: 5' (1.524 m).   Weight as of this encounter: 62.5 kg.         Consultants: Neurology, Ortho, Palliative care Procedures performed: Left BKA Disposition: Skilled nursing facility Diet recommendation:  Discharge Diet Orders (From admission, onward)     Start     Ordered   12/06/23 0000  Diet - low sodium heart healthy        12/06/23 1306           Cardiac and Carb modified diet DISCHARGE MEDICATION: Allergies as of 12/06/2023       Reactions   Amitriptyline  Swelling, Other (See Comments)   Hypotension, Mental Status Changes, Confusion - "Allergic," per MAR   Ciprofloxacin  Diarrhea, Nausea And Vomiting, Other (See Comments)   Severe vomiting requiring ED visit, IV reglan  and IV fluids- "Allergic," per MAR   Ibuprofen  Itching   Aspirin  Itching, Other (See Comments)   Pt endorses getting itchy in the past with aspirin . Tolerated dose 04/06/22.   Semaglutide Nausea Only, Other (See Comments)   "Allergic," per MAR   Tramadol  Itching   Tylenol  [acetaminophen ] Itching, Other (See Comments)   "Allergic," per [facility]        Medication List     STOP taking these medications    Chloraseptic 6-10 MG lozenge Generic drug: benzocaine-menthol     hydrALAZINE  100 MG tablet Commonly known as: APRESOLINE    HYDROcodone -acetaminophen  5-325 MG tablet Commonly known as: NORCO/VICODIN   lactulose 10 GM/15ML solution Commonly known as: CHRONULAC   pregabalin  100 MG capsule Commonly known as: LYRICA        TAKE these medications    amLODipine  5 MG tablet Commonly known as: NORVASC  Take 1 tablet (5 mg total) by mouth every evening. What changed:  medication strength how much to take when to take this   ascorbic acid  1000 MG tablet Commonly known as: VITAMIN C  Take 1 tablet (1,000 mg total) by mouth daily.   atorvastatin  20 MG tablet Commonly known as: LIPITOR Take 1 tablet (20 mg total) by mouth daily.   calcitRIOL  0.25 MCG capsule Commonly known as: ROCALTROL  Take 1 capsule (0.25 mcg total) by mouth Every Tuesday,Thursday,and Saturday with dialysis.   calcium  carbonate 500 MG chewable tablet Commonly known as: Tums Chew 1 tablet (200 mg of elemental calcium  total) by mouth 3 (three) times daily with meals. What changed:  medication strength how much to take   cloNIDine  0.3 mg/24hr patch Commonly known as: CATAPRES  - Dosed in mg/24 hr Place 0.3 mg onto the skin  every Wednesday.   Eliquis  5 MG Tabs tablet Generic drug: apixaban  Take 5 mg by mouth 2 (two) times daily.   ferrous sulfate 325 (65 FE) MG EC tablet Take 325 mg by mouth in the morning and at bedtime.   fluticasone 50 MCG/ACT nasal spray Commonly known as: FLONASE Place 2 sprays into both nostrils in the morning and at bedtime.   HYDROmorphone  2 MG tablet Commonly known as: DILAUDID  Take 0.5-1 tablets (1-2 mg total) by mouth every 6 (six) hours as needed for up to 3 days for severe pain (pain score 7-10).   isosorbide  mononitrate 30 MG 24 hr tablet Commonly known as: IMDUR Take 30 mg by mouth See admin instructions. Take 30 mg by mouth at 8 AM on Sun/Mon/Wed/Fri   lidocaine  5 % Commonly known as: LIDODERM  Place 1 patch onto the skin See admin  instructions. Apply 1 new patch to bilateral feet at 8 AM daily- Remove & Discard patch within 12 hours or as directed by MD   metoprolol  tartrate 50 MG tablet Commonly known as: LOPRESSOR  Take 1 tablet (50 mg total) by mouth 2 (two) times daily. What changed:  when to take this additional instructions   multivitamin Tabs tablet Take 1 tablet by mouth daily at 8 pm.   nystatin cream Commonly known as: MYCOSTATIN Apply 1 Application topically See admin instructions. Apply to sacrum 2 times a day after cleansing ab=nd drying. Apply collagen particles after nystatin cream.   omeprazole  40 MG capsule Commonly known as: PRILOSEC Take 40 mg by mouth in the morning and at bedtime.   ondansetron  4 MG tablet Commonly known as: Zofran  Take 1 tablet (4 mg total) by mouth every 8 (eight) hours as needed for nausea or vomiting.   PROTEIN PO Take 30 mLs by mouth 2 (two) times daily.   thiamine  100 MG tablet Commonly known as: Vitamin B-1 Take 1 tablet (100 mg total) by mouth daily.   zinc  sulfate (50mg  elemental zinc ) 220 (50 Zn) MG capsule Take 1 capsule (220 mg total) by mouth daily.               Discharge Care Instructions  (From admission, onward)           Start     Ordered   12/06/23 0000  Discharge wound care:       Comments: See above   12/06/23 1306            Contact information for follow-up providers     Timothy Ford, MD Follow up in 1 week(s).   Specialty: Orthopedic Surgery Contact information: [address] [place]  [place] [address] [phone]         [facility]. Schedule an appointment as soon as possible for a visit in 1 month(s).   Specialty: Neurology Why: stroke clinic Contact information: [address] [place]  [address] [phone]             Contact information for after-discharge care     Destination     [facility] .   Service: Skilled  Nursing Contact information: [address] [place]  [address] [phone]                    Discharge Exam: Filed Weights   12/04/23 0615 12/05/23 0510 12/06/23 0855  Weight: 61.8 kg 62.5 kg 62.5 kg   General; NAD  Condition at discharge: stable  The results of significant  diagnostics from this hospitalization (including imaging, microbiology, ancillary and laboratory) are listed below for reference.   Imaging Studies: ECHOCARDIOGRAM COMPLETE Result Date: 11/30/2023    ECHOCARDIOGRAM REPORT   Patient Name:   AMARACHUKWU LAKATOS Date of Exam: 11/30/2023 Medical Rec #:  [id number]     Height:       60.0 in Accession #:    [id number]    Weight:       154.5 lb Date of Birth:  10-10-1965     BSA:          1.673 m Patient Age:    58 years      BP:           139/59 mmHg Patient Gender: F             HR:           60 bpm. Exam Location:  Inpatient Procedure: 2D Echo, Cardiac Doppler and Color Doppler (Both Spectral and Color            Flow Doppler were utilized during procedure). Indications:    Stroke I63.9  History:        Patient has prior history of Echocardiogram examinations, most                 recent 05/29/2020. Risk Factors:Hypertension and Diabetes.  Sonographer:    Kip Peon RDCS Referring Phys: [id number] RAVI PAHWANI  Sonographer Comments: Image acquisition challenging due to uncooperative patient. IMPRESSIONS  1. Mild intracavitary gradient. Left ventricular ejection fraction, by estimation, is 60 to 65%. The left ventricle has normal function. The left ventricle has no regional wall motion abnormalities. Left ventricular diastolic parameters are consistent with Grade I diastolic dysfunction (impaired relaxation).  2. Right ventricular systolic function is normal. The right ventricular size is normal. There is normal pulmonary artery systolic pressure.  3. The mitral valve is normal in structure. Trivial mitral valve regurgitation. No evidence of mitral stenosis.  Moderate mitral annular calcification.  4. The aortic valve is tricuspid. There is mild calcification of the aortic valve. There is mild thickening of the aortic valve. Aortic valve regurgitation is not visualized. No aortic stenosis is present.  5. The inferior vena cava is normal in size with greater than 50% respiratory variability, suggesting right atrial pressure of 3 mmHg. FINDINGS  Left Ventricle: Mild intracavitary gradient. Left ventricular ejection fraction, by estimation, is 60 to 65%. The left ventricle has normal function. The left ventricle has no regional wall motion abnormalities. The left ventricular internal cavity size  was normal in size. There is no left ventricular hypertrophy. Left ventricular diastolic parameters are consistent with Grade I diastolic dysfunction (impaired relaxation). Indeterminate filling pressures. Right Ventricle: The right ventricular size is normal. No increase in right ventricular wall thickness. Right ventricular systolic function is normal. There is normal pulmonary artery systolic pressure. The tricuspid regurgitant velocity is 2.31 m/s, and  with an assumed right atrial pressure of 3 mmHg, the estimated right ventricular systolic pressure is 24.3 mmHg. Left Atrium: Left atrial size was normal in size. Right Atrium: Right atrial size was normal in size. Pericardium: There is no evidence of pericardial effusion. Mitral Valve: The mitral valve is normal in structure. Moderate mitral annular calcification. Trivial mitral valve regurgitation. No evidence of mitral valve stenosis. Tricuspid Valve: The tricuspid valve is normal in structure. Tricuspid valve regurgitation is trivial. No evidence of tricuspid stenosis. Aortic Valve: The aortic valve is tricuspid. There is mild calcification of the aortic  valve. There is mild thickening of the aortic valve. Aortic valve regurgitation is not visualized. No aortic stenosis is present. Pulmonic Valve: The pulmonic valve was  normal in structure. Pulmonic valve regurgitation is not visualized. No evidence of pulmonic stenosis. Aorta: The aortic root is normal in size and structure. Venous: The inferior vena cava is normal in size with greater than 50% respiratory variability, suggesting right atrial pressure of 3 mmHg. IAS/Shunts: No atrial level shunt detected by color flow Doppler.  LEFT VENTRICLE PLAX 2D LVIDd:         4.20 cm   Diastology LVIDs:         2.50 cm   LV e' medial:    5.55 cm/s LV PW:         1.00 cm   LV E/e' medial:  13.3 LV IVS:        1.00 cm   LV e' lateral:   9.79 cm/s LVOT diam:     1.70 cm   LV E/e' lateral: 7.5 LV SV:         68 LV SV Index:   41 LVOT Area:     2.27 cm  RIGHT VENTRICLE             IVC RV Basal diam:  3.10 cm     IVC diam: 1.40 cm RV S prime:     14.90 cm/s TAPSE (M-mode): 2.2 cm LEFT ATRIUM             Index        RIGHT ATRIUM           Index LA diam:        2.70 cm 1.61 cm/m   RA Area:     12.00 cm LA Vol (A2C):   19.6 ml 11.72 ml/m  RA Volume:   25.80 ml  15.42 ml/m LA Vol (A4C):   25.3 ml 15.12 ml/m LA Biplane Vol: 24.2 ml 14.47 ml/m  AORTIC VALVE LVOT Vmax:   110.00 cm/s LVOT Vmean:  78.600 cm/s LVOT VTI:    0.300 m  AORTA Ao Root diam: 2.40 cm Ao Asc diam:  2.80 cm MITRAL VALVE               TRICUSPID VALVE MV Area (PHT): 2.66 cm    TR Peak grad:   21.3 mmHg MV Decel Time: 285 msec    TR Vmax:        231.00 cm/s MV E velocity: 73.90 cm/s MV A velocity: 98.50 cm/s  SHUNTS MV E/A ratio:  0.75        Systemic VTI:  0.30 m                            Systemic Diam: 1.70 cm Maudine Sos MD Electronically signed by Maudine Sos MD Signature Date/Time: 11/30/2023/2:42:32 PM    Final    CT ANGIO HEAD NECK W WO CM Result Date: 11/29/2023 CLINICAL DATA:  Stroke follow-up EXAM: CT ANGIOGRAPHY HEAD AND NECK WITH AND WITHOUT CONTRAST TECHNIQUE: Multidetector CT imaging of the head and neck was performed using the standard protocol during bolus administration of intravenous contrast.  Multiplanar CT image reconstructions and MIPs were obtained to evaluate the vascular anatomy. Carotid stenosis measurements (when applicable) are obtained utilizing NASCET criteria, using the distal internal carotid diameter as the denominator. RADIATION DOSE REDUCTION: This exam was performed according to the departmental dose-optimization program which includes automated exposure control,  adjustment of the mA and/or kV according to patient size and/or use of iterative reconstruction technique. CONTRAST:  75mL OMNIPAQUE  IOHEXOL  350 MG/ML SOLN COMPARISON:  11/28/2023 brain MRI FINDINGS: CT HEAD FINDINGS Brain: There is no mass, hemorrhage or extra-axial collection. The size and configuration of the ventricles and extra-axial CSF spaces are normal. There is hypoattenuation of the white matter, most commonly indicating chronic small vessel disease. Vascular: No hyperdense vessel or unexpected vascular calcification. Skull: The visualized skull base, calvarium and extracranial soft tissues are normal. Sinuses/Orbits: No fluid levels or advanced mucosal thickening of the visualized paranasal sinuses. No mastoid or middle ear effusion. Normal orbits. CTA NECK FINDINGS Skeleton: No acute abnormality or high grade bony spinal canal stenosis. Other neck: Normal pharynx, larynx and major salivary glands. No cervical lymphadenopathy. Unremarkable thyroid  gland. Upper chest: No pneumothorax or pleural effusion. No nodules or masses. Aortic arch: There is calcific atherosclerosis of the aortic arch. Conventional 3 vessel aortic branching pattern. RIGHT carotid system: No dissection, occlusion or aneurysm. Mild atherosclerotic calcification at the carotid bifurcation without hemodynamically significant stenosis. LEFT carotid system: No dissection, occlusion or aneurysm. There is mixed density atherosclerosis extending into the proximal ICA, resulting in less than 50% stenosis. Vertebral arteries: Codominant configuration. There  is no dissection, occlusion or flow-limiting stenosis to the skull base (V1-V3 segments). CTA HEAD FINDINGS POSTERIOR CIRCULATION: Vertebral arteries are normal. No proximal occlusion of the anterior or inferior cerebellar arteries. Basilar artery is normal. Superior cerebellar arteries are normal. Posterior cerebral arteries are normal. ANTERIOR CIRCULATION: Bilateral intracranial ICA atherosclerosis with moderate stenosis of the supraclinoid left ICA. Anterior cerebral arteries are normal. Middle cerebral arteries are normal. Venous sinuses: As permitted by contrast timing, patent. Anatomic variants: The right A1 segment of the ACA is absent. Both posterior communicating arteries are patent. Review of the MIP images confirms the above findings. IMPRESSION: 1. No emergent large vessel occlusion. 2. 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 3. Bilateral carotid bifurcation atherosclerosis without hemodynamically significant stenosis. Aortic Atherosclerosis (ICD10-I70.0). Electronically Signed   By: Juanetta Nordmann M.D.   On: 11/29/2023 20:13   MR BRAIN WO CONTRAST Result Date: 11/28/2023 EXAM: MRI BRAIN WITHOUT CONTRAST 11/28/2023 11:14:22 AM TECHNIQUE: Multiplanar multisequence MRI of the head/brain was performed without the administration of intravenous contrast. COMPARISON: MR head without and with contrast 04/05/2022. CLINICAL HISTORY: Neuro deficit, acute, stroke suspected. FINDINGS: BRAIN AND VENTRICLES: Multiple bilateral punctate nonhemorrhagic infarcts are present. A punctate cortical infarct was present in the right middle frontal gyrus on image 42 of series 2. A new punctate white matter infarct is present in the posterior left centrum semiovale on image 37. A punctate white matter infarct is present in the posterior right corona radiata on image 35 with a second lesion more anterior on image 31. A subcortical punctate white matter infarct is present in the posterior right temporal lobe on image  23. A medial inferior posterior left cerebellar lesion is present on image 13. Periventricular and subcortical white matter T2 hyperintensities and moderate generalized atrophy demonstrate slight progression compared to the prior exam. White matter changes extend into the brainstem. Punctate foci of susceptibility again noted in the cerebellum bilaterally. No focal abnormality in the posterior right hippocampus, stable. ORBITS: Bilateral lens replacements are noted. The globes and orbits are otherwise within normal limits. SINUSES AND MASTOIDS: Fluid levels are present in the sphenoid sinuses, right greater than left. The paranasal sinuses and mastoid air cells are otherwise clear. BONES AND SOFT TISSUES: Normal marrow signal.  No acute soft tissue abnormality. IMPRESSION: 1. Multiple acute bilateral punctate nonhemorrhagic infarcts in multiple vascular distributions as described. 2. Slight progression of periventricular and subcortical white matter T2 hyperintensities and moderate generalized atrophy compared to the prior exam. White matter changes extend into the brainstem. Findings are consistent with progressive chronic microvascular disease. 3. Fluid levels in the sphenoid sinuses, right greater than left suggesting acute sinusitis . Electronically signed by: Audree Leas MD 11/28/2023 12:19 PM EDT RP Workstation: [id number]   MR HEEL LEFT WO CONTRAST Result Date: 11/23/2023 CLINICAL DATA:  Diabetic foot swelling, tissue necrosis EXAM: MR OF THE LEFT HEEL WITHOUT CONTRAST TECHNIQUE: Multiplanar, multisequence MR imaging of the left heel was performed. No intravenous contrast was administered. COMPARISON:  Radiographs 11/21/2023 FINDINGS: Bones/Joint/Cartilage Abnormal edema posteriorly and medially in the calcaneus compatible with osteomyelitis given the overlying ulceration and abnormal gas in the soft tissues. Marrow edema in the talar neck and adjacent talar body potentially from stress fracture or  osteomyelitis. Mild degenerative subcortical findings at the calcaneocuboid articulation. Ligaments Attenuated anterior talofibular ligament, query prior tear. Muscles and Tendons Abnormal edema throughout the abductor hallucis muscle with some tenderness laxity along the myotendinous junction suspicious for tear. Regional muscular atrophy and low-grade edema probably neurogenic. Soft tissues Prominent plantar-lateral heel ulceration with gas tracking in the soft tissues and fluid infiltration as well extending below the calcaneus and superficial to the abductor digiti the minimi muscle in the subcutaneous tissues. Appearance compatible with necrotic tissue and gas-forming soft tissue infection. IMPRESSION: 1. Prominent plantar-lateral heel ulceration with gas tracking in the soft tissues and fluid infiltration as well extending below the calcaneus and superficial to the abductor digiti minimi muscle in the subcutaneous tissues. Appearance compatible with necrotic tissue and gas-forming soft tissue infection. 2. Abnormal edema posteriorly and medially in the calcaneus compatible with osteomyelitis. 3. Marrow edema in the talar neck and adjacent talar body potentially from stress fracture or osteomyelitis. 4. Abnormal edema throughout the abductor hallucis muscle with some laxity along the myotendinous junction suspicious for tear. Regional muscular atrophy and low-grade edema probably neurogenic. 5. Attenuated anterior talofibular ligament, query prior tear. Electronically Signed   By: Freida Jes M.D.   On: 11/23/2023 10:30   MR FOOT LEFT WO CONTRAST Result Date: 11/23/2023 CLINICAL DATA:  Chronic heel/foot pain. Gas in the plantar soft tissues on radiographs 11/21/2023 EXAM: MRI OF THE LEFT FOOT WITHOUT CONTRAST TECHNIQUE: Multiplanar, multisequence MR imaging of the left forefoot was performed. No intravenous contrast was administered. COMPARISON:  11/21/2023 radiographs FINDINGS: Bones/Joint/Cartilage  Arthropathy of the proximal interphalangeal joint of the second toe with potential resorption of the head of the proximal phalanx, hyperflexion of the PIP joint, and edema signal especially in the distal half of the proximal phalanx and also on the base of the middle phalanx. Strictly speaking osteomyelitis is not excluded. No obvious joint effusion. Stress fracture of the proximal metaphysis of the first metatarsal with associated low-grade edema throughout the shaft of the first metatarsal. The stress fractures primarily visible laterally as on image 15 series 12. Marrow edema distally in the lateral cuneiform as on image 16 series 12, compatible with stress injury. Partially visualized findings in the hindfoot are reserved for the ankle MRI dictation. Ligaments Lisfranc ligament intact. Muscles and Tendons Abnormal muscular edema in the abductor hallucis muscle on 9 series 11 with some laxity along the myotendinous junction suspicious for muscle tear. Low-level edema within along remaining musculature of the forefoot likely neurogenic. Atrophic extensor digitorum brevis  muscle. Soft tissues Loculations of gas an infiltrative fluid track along the plantar-lateral margin of the abductor digiti minimi muscle. Subcutaneous edema tracks along the plantar foot and to a lesser extent along the dorsal foot. IMPRESSION: 1. Loculations of gas and infiltrative fluid track along the plantar-lateral margin of the abductor digiti minimi muscle. This is compatible with soft tissue infection and gas-forming organism. 2. Abnormal muscular edema in the abductor hallucis muscle with some laxity along the myotendinous junction suspicious for muscle tear. 3. Stress fracture of the proximal metaphysis of the first metatarsal with associated low-grade edema throughout the shaft of the first metatarsal. 4. Marrow edema distally in the lateral cuneiform, compatible with stress injury. 5. Arthropathy of the proximal interphalangeal joint  of the second toe with potential resorption of the head of the proximal phalanx, hyperflexion of the PIP joint, and edema signal especially in the distal half of the proximal phalanx and also on the base of the middle phalanx. Strictly speaking osteomyelitis is not excluded. 6. Subcutaneous edema tracks along the plantar foot and to a lesser extent along the dorsal foot. 7. Partially visualized findings in the hindfoot are reserved for the ankle MRI dictation. Electronically Signed   By: Freida Jes M.D.   On: 11/23/2023 10:25   DG Foot Complete Left Result Date: 11/21/2023 CLINICAL DATA:  Infection.  Left foot gangrene. EXAM: LEFT FOOT - COMPLETE 3+ VIEW COMPARISON:  None Available. FINDINGS: The bones are diffusely under mineralized. Hammertoe deformity of the digits further limits assessment. Patchy subcutaneous gas in the hindfoot with the probable ulcer posterior to the calcaneus. Soft tissue gas is seen laterally the hindfoot and midfoot. There is no obvious bony destructive change, however the degree of bony under mineralization limits assessment. Soft tissue ulcer may extend to bone involving the posterior calcaneus. No evidence of acute fracture. Generalized soft tissue edema with prominent vascular calcifications. Punctate densities in the plantar soft tissues may be calcifications or tiny radiopaque foreign bodies. IMPRESSION: 1. Probable soft tissue ulcer posterior to the calcaneus. Patchy soft tissue gas is seen in the plantar and lateral hindfoot and midfoot. 2. No obvious bony destructive change, however the degree of bony under mineralization limits assessment. Soft tissue ulcer may extend to bone involving the posterior calcaneus. 3. Generalized soft tissue edema with prominent vascular calcifications. Punctate densities in the plantar soft tissues may be calcifications or tiny radiopaque foreign bodies. Electronically Signed   By: Chadwick Colonel M.D.   On: 11/21/2023 17:10     Microbiology: Results for orders placed or performed during the hospital encounter of 11/21/23  Blood Cultures x 2 sites     Status: None   Collection Time: 11/21/23  4:41 PM   Specimen: BLOOD LEFT ARM  Result Value Ref Range Status   Specimen Description   Final    BLOOD LEFT ARM Performed at [facility], [address] [place], [place] [address]    Special Requests   Final    BOTTLES DRAWN AEROBIC AND ANAEROBIC Blood Culture results may not be optimal due to an inadequate volume of blood received in culture bottles Performed at [facility], [address], [place], [place] [address]    Culture   Final    NO GROWTH 5 DAYS Performed at [facility], [address] [place], [place] [address]    Report Status 11/26/2023 FINAL  Final  Culture, blood (Routine X 2) w Reflex to ID Panel  Status: None   Collection Time: 11/22/23 12:48 AM   Specimen: BLOOD  Result Value Ref Range Status   Specimen Description BLOOD RIGHT ANTECUBITAL  Final   Special Requests   Final    BOTTLES DRAWN AEROBIC AND ANAEROBIC Blood Culture adequate volume   Culture   Final    NO GROWTH 5 DAYS Performed at [facility], [address] [place], [place] [address]    Report Status 11/27/2023 FINAL  Final  Surgical pcr screen     Status: None   Collection Time: 11/25/23 11:31 AM   Specimen: Nasal Mucosa; Nasal Swab  Result Value Ref Range Status   MRSA, PCR NEGATIVE NEGATIVE Final   Staphylococcus aureus NEGATIVE NEGATIVE Final    Comment: (NOTE) The Xpert SA Assay (FDA approved for NASAL specimens in patients 29 years of age and older), is one component of a comprehensive surveillance program. It is not intended to diagnose infection nor to guide or monitor treatment. Performed at [facility], [address], [place], [place] [address]   Culture, blood (Routine X 2) w Reflex to ID Panel     Status: None   Collection Time: 11/28/23  7:18 PM    Specimen: BLOOD LEFT HAND  Result Value Ref Range Status   Specimen Description BLOOD LEFT HAND  Final   Special Requests   Final    BOTTLES DRAWN AEROBIC ONLY Blood Culture results may not be optimal due to an inadequate volume of blood received in culture bottles   Culture   Final    NO GROWTH 5 DAYS Performed at [facility], [address] [place], [place] [address]    Report Status 12/03/2023 FINAL  Final  Culture, blood (Routine X 2) w Reflex to ID Panel     Status: None   Collection Time: 11/28/23  7:18 PM   Specimen: BLOOD LEFT HAND  Result Value Ref Range Status   Specimen Description BLOOD LEFT HAND  Final   Special Requests   Final    AEROBIC BOTTLE ONLY Blood Culture results may not be optimal due to an inadequate volume of blood received in culture bottles   Culture   Final    NO GROWTH 5 DAYS Performed at [facility], [address], [place], [place] [address]    Report Status 12/03/2023 FINAL  Final    Labs: CBC: Recent Labs  Lab 11/30/23 1038 12/01/23 0830 12/02/23 1823 12/03/23 0255 12/06/23 0716  WBC 12.9* 12.9* 11.7* 11.7* 11.5*  NEUTROABS 10.2*  --   --   --   --   HGB 12.6 10.4* 12.8 11.6* 11.3*  HCT 39.8 33.3* 39.9 36.7 36.2  MCV 76.2* 76.6* 76.3* 76.5* 76.7*  PLT 347 388 330 380 418*   Basic Metabolic Panel: Recent Labs  Lab 12/01/23 0830 12/02/23 1823 12/06/23 0716  NA 136 134* 131*  K 4.0 3.8 4.6  CL 98 92* 95*  CO2 24 27 26   GLUCOSE 82 91 117*  BUN 26* 27* 26*  CREATININE 3.28* 3.04* 3.93*  CALCIUM  7.7* 8.5* 8.1*  PHOS 2.6  --  2.8   Liver Function Tests: Recent Labs  Lab 12/01/23 0830 12/06/23 0716  ALBUMIN  2.0* 2.3*   CBG: Recent Labs  Lab 12/05/23 0605 12/05/23 1221 12/05/23 1621 12/05/23 2111 12/06/23 0621  GLUCAP 107* 106* 103* 123* 137*    Discharge time spent: greater than 30 minutes.  Signed: Danette Duos, MD [facility] 12/06/2023

## 2023-12-06 NOTE — TOC Transition Note (Signed)
 Transition of Care [facility]) - Discharge Note   Patient Details  Name: [name] [name] MRN: [id number] Date of Birth: 1966-03-10  [facility]) CM/SW Contact:  Tandy Fam, LCSW Phone Number: 12/06/2023, 4:07 PM   Clinical Narrative:   CSW updated by medical team that patient was able to get to chair for dialysis today, stable for return to SNF. CSW updated SNF, they can accept. CSW sent discharge information to [facility], confirmed receipt. CSW ensured [facility] had information on change of dialysis location and time. Transport arranged with [facility] for after 5 due to nursing dressing change.  Nurse to call report to [phone].    Final next level of care: Skilled Nursing Facility Barriers to Discharge: Barriers Resolved   Patient Goals and CMS Choice   CMS Medicare.gov Compare Post Acute Care list provided to:: Patient Represenative (must comment) (husband) Choice offered to / list presented to : Spouse [place] ownership interest in [facility].provided to:: Spouse    Discharge Placement              Patient chooses bed at:  [facility]) Patient to be transferred to facility by: [facility] Name of family member notified: Self Patient and family notified of of transfer: 12/06/23  Discharge Plan and Services Additional resources added to the After Visit Summary for                                       Social Drivers of Health (SDOH) Interventions SDOH Screenings   Food Insecurity: No Food Insecurity (11/21/2023)  Housing: Unknown (11/21/2023)  Transportation Needs: No Transportation Needs (11/21/2023)  Utilities: Not At Risk (11/21/2023)  Social Connections: Unknown (11/15/2021)   Received from [facility]  Tobacco Use: Low Risk  (11/25/2023)     Readmission Risk Interventions     No data to display

## 2023-12-06 NOTE — Progress Notes (Signed)
 Expand All Collapse All    [name] Progress Note    Subjective:    Pt seen in room Ox 3    Objective       Vitals:    12/04/23 0742 12/04/23 1020 12/04/23 1124 12/04/23 1536  BP: (!) 182/76 (!) 148/70 (!) 166/72 (!) 168/71  Pulse: 64   61 62  Resp: 18   17 18   Temp: 97.7 F (36.5 C)   98 F (36.7 C) 98.3 F (36.8 C)  TempSrc: Oral   Oral Axillary  SpO2: 97%   98% 100%  Weight:          Height:            Physical Exam General: alert, responsive Heart: RRR, no murmurs, rubs or gallops Lungs: CTA bilaterally, respirations unlabored  Abdomen: Soft, non-distended, +BS Extremities: No edema b/l lower extremities Dialysis Access: internal jugular TDC   Renal-related home meds: Catapres  patch 0.3 q Wednesday Metoprolol  50 bid, hold am dose on hd days Renavite daily Norvasc  10 every day Hydralazine  100 tid, hold am dose on hd days   OP HD: GO TTS  3h  B350   64kg   TDC  Heparin  5000 Mircera 225 mcg q 2 weeks. 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 HD tomorrow.  HTN: bp's good on reduced catapres  patch and norvasc  + usual metoprolol  Volume: new dry wt ~ 61kg post BKA. UF 2.5 L next HD Anemia of CKD: getting max ESA here as at OP unit. SP 2u prbc 5/25. Hb 10-12 now. Follow.  Metabolic bone disease: hypophosphatemia, now improved. Holding binder. On calcitriol .  Encephalopathy: MRI with bilateral watershed infarct (stroke) pattern. Was unresponsive for days, is now much better.       Larry Poag  MD  [facility] 12/04/2023, 6:31 PM   Last Labs       Recent Labs  Lab 12/01/23 0830 12/02/23 1823 12/03/23 0255  HGB 10.4* 12.8 11.6*  ALBUMIN  2.0*  --   --   CALCIUM  7.7* 8.5*  --   PHOS 2.6  --   --   CREATININE 3.28* 3.04*  --   K 4.0 3.8  --         Inpatient medications:  amLODipine   5 mg Oral QPM   apixaban   5 mg Oral BID   vitamin C   1,000 mg Oral  Daily   atorvastatin   20 mg Oral Daily   calcitRIOL   0.25 mcg Oral Q T,Th,Sa-HD   calcium  carbonate  1 tablet Oral TID WC   Chlorhexidine  Gluconate Cloth  6 each Topical Q0600   cloNIDine   0.1 mg Transdermal Weekly   insulin  aspart  0-5 Units Subcutaneous QHS   insulin  aspart  0-6 Units Subcutaneous TID WC   metoprolol  tartrate  50 mg Oral BID   nutrition supplement (JUVEN)  1 packet Oral BID BM   sodium chloride  flush  10-40 mL Intracatheter Q12H   thiamine   100 mg Oral Daily   zinc  sulfate (50mg  elemental zinc )  220 mg Oral Daily         sodium chloride          acetaminophen  **OR** acetaminophen , calcium  carbonate (dosed in mg elemental calcium ), camphor-menthol  **AND** hydrOXYzine , docusate sodium , feeding supplement (NEPRO CARB STEADY), HYDROmorphone  (DILAUDID ) injection, HYDROmorphone , LORazepam , naLOXone  (NARCAN )  injection, ondansetron  **OR** ondansetron  (ZOFRAN ) IV, prochlorperazine , sodium chloride  flush,  sorbitol 

## 2023-12-06 NOTE — Procedures (Addendum)
 I was present at the procedure, reviewed the HD regimen and made appropriate changes.   Larry Poag MD  [facility] 12/06/2023, 10:31 AM

## 2023-12-06 NOTE — Progress Notes (Signed)
 OT Cancellation Note  Patient Details Name: [name] [name] MRN: [id number] DOB: 01/24/1966   Cancelled Treatment:    Reason Eval/Treat Not Completed: Patient at procedure or test/ unavailable Acknowledge new OT orders; pt off unit for HD this AM. Of note, OT recently signed off 5/28 due to poor participation with therapy and pt being close to baseline.  Shireen Dory 12/06/2023, 9:27 AM

## 2023-12-06 NOTE — Progress Notes (Signed)
 PT Cancellation Note  Patient Details Name: [name] [name] MRN: [id number] DOB: [birth year]   Cancelled Treatment:    Reason Eval/Treat Not Completed: Patient at procedure or test/unavailable, off unit for HD this morning, will continue to follow and evaluate as time/schedule allows.   Barnabas Booth, PT, DPT   Acute Rehabilitation Department Office [phone] Secure Chat Communication Preferred   [name] [name] 12/06/2023, 9:37 AM

## 2023-12-06 NOTE — NC FL2 (Signed)
 [place]  MEDICAID FL2 LEVEL OF CARE FORM     IDENTIFICATION  Patient Name: [name] [name] Birthdate: [birth year] Sex: female Admission Date (Current Location): 11/21/2023  [facility] and [place] Number:  Producer, television/film/video and Address:  [facility] [place], [address] [place], [place] [address]      Provider Number: [id number]  Attending Physician Name and Address:  Danette Duos, MD  Relative Name and Phone Number:       Current Level of Care: Hospital Recommended Level of Care: Skilled Nursing Facility Prior Approval Number:    Date Approved/Denied:   PASRR Number: [id number]  Discharge Plan: SNF    Current Diagnoses: Patient Active Problem List   Diagnosis Date Noted   Osteomyelitis of left foot (HCC) 11/24/2023   Cellulitis of left lower extremity 11/21/2023   Anemia due to chronic kidney disease 11/21/2023   Hammertoes of both feet 05/21/2022   Hyperpigmentation 05/21/2022   Hypertensive emergency 05/20/2022   Hypertensive urgency, malignant 05/19/2022   Pyocystis 05/19/2022   Hypokalemia 05/19/2022   Cellulitis 04/09/2021   Cellulitis of suprapubic region 04/09/2021   Leukocytosis 04/09/2021   DM2 (diabetes mellitus, type 2) (HCC) 04/09/2021   Osteoporosis 10/13/2020   Fever    Closed fracture of left proximal tibia 10/10/2020   Fracture of femoral neck, right (HCC) 10/02/2020   Pressure injury of skin 03/12/2020   Sepsis (HCC) 03/07/2020   ESRD (end stage renal disease) (HCC) 03/07/2020   Hyperkalemia 03/07/2020   Acute cystitis 03/07/2020   Acute metabolic encephalopathy 03/07/2020   Impaired vision in both eyes 11/19/2015   Diabetes mellitus with complication (HCC)    Intractable headache    Syncope 07/28/2015   Chronic headaches 07/28/2015   Chest pain 05/22/2015   Urinary frequency 03/13/2015   Easy bruising 10/09/2014   Encounter for preventative adult health care exam with abnormal findings 01/11/2014   Chronic  constipation 01/11/2014   GERD (gastroesophageal reflux disease) 01/11/2014   Helicobacter positive gastritis 12/06/2013   Dizziness and giddiness 10/12/2013   Essential hypertension 09/19/2013   Back pain 06/11/2013   Lumbosacral spondylosis without myelopathy 02/20/2013   Diabetic neuropathy (HCC) 01/09/2013   DM (diabetes mellitus), type 2, uncontrolled 10/02/2012   MITRAL REGURGITATION, mild 10/31/2009   Pleural effusion 10/20/2009    Orientation RESPIRATION BLADDER Height & Weight     Self, Time, Situation, Place  Normal  (anuria) Weight: 137 lb 12.6 oz (62.5 kg) Height:  5' (152.4 cm)  BEHAVIORAL SYMPTOMS/MOOD NEUROLOGICAL BOWEL NUTRITION STATUS      Incontinent Diet (see DC summary)  AMBULATORY STATUS COMMUNICATION OF NEEDS Skin   Extensive Assist Verbally PU Stage and Appropriate Care, Surgical wounds (left leg, dry dressing)     PU Stage 3 Dressing: Daily (sacrum, foam dressing)                 Personal Care Assistance Level of Assistance  Bathing, Feeding, Dressing Bathing Assistance: Maximum assistance Feeding assistance: Limited assistance Dressing Assistance: Maximum assistance     Functional Limitations Info  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)        SPECIAL CARE FACTORS FREQUENCY  PT (By licensed PT), OT (By licensed OT)     PT Frequency: 5x/wk OT Frequency: 5x/wk            Contractures Contractures Info: Not present    Additional Factors Info  Code Status, Allergies Code Status Info: Full Allergies Info: Amitriptyline , Ciprofloxacin , Ibuprofen , Aspirin , Semaglutide,  Tramadol , Tylenol  (Acetaminophen )           Current Medications (12/06/2023):  This is the current hospital active medication list Current Facility-Administered Medications  Medication Dose Route Frequency Provider Last Rate Last Admin   0.9 %  sodium chloride  infusion   Intravenous Continuous Duda, Marcus V, MD       acetaminophen  (TYLENOL ) tablet 650 mg  650 mg  Oral Q6H PRN Timothy Ford, MD       Or   acetaminophen  (TYLENOL ) suppository 650 mg  650 mg Rectal Q6H PRN Timothy Ford, MD       amLODipine  (NORVASC ) tablet 5 mg  5 mg Oral QPM Nan Aver, MD   5 mg at 12/05/23 1803   apixaban  (ELIQUIS ) tablet 5 mg  5 mg Oral BID Pahwani, Ravi, MD   5 mg at 12/05/23 2158   ascorbic acid  (VITAMIN C ) tablet 1,000 mg  1,000 mg Oral Daily Duda, Marcus V, MD   1,000 mg at 12/05/23 1005   atorvastatin  (LIPITOR) tablet 20 mg  20 mg Oral Daily Consuelo Denmark, MD   20 mg at 12/05/23 1004   calcitRIOL  (ROCALTROL ) capsule 0.25 mcg  0.25 mcg Oral Q T,Th,Sa-HD Timothy Ford, MD   0.25 mcg at 12/03/23 1213   calcium  carbonate (dosed in mg elemental calcium ) suspension 500 mg of elemental calcium   500 mg of elemental calcium  Oral Q6H PRN Timothy Ford, MD       calcium  carbonate (TUMS - dosed in mg elemental calcium ) chewable tablet 200 mg of elemental calcium   1 tablet Oral TID WC Duda, Marcus V, MD   200 mg of elemental calcium  at 12/06/23 1323   camphor-menthol  (SARNA) lotion 1 Application  1 Application Topical Q8H PRN Timothy Ford, MD       And   hydrOXYzine  (ATARAX ) tablet 25 mg  25 mg Oral Q8H PRN Timothy Ford, MD   25 mg at 11/30/23 1245   Chlorhexidine  Gluconate Cloth 2 % PADS 6 each  6 each Topical Q0600 Chucky Craver, MD   6 each at 12/06/23 0630   cloNIDine  (CATAPRES  - Dosed in mg/24 hr) patch 0.1 mg  0.1 mg Transdermal Weekly Duda, Marcus V, MD   0.1 mg at 11/30/23 1459   docusate sodium  (ENEMEEZ) enema 283 mg  1 enema Rectal PRN Timothy Ford, MD       feeding supplement (NEPRO CARB STEADY) liquid 237 mL  237 mL Oral TID PRN Timothy Ford, MD       Gerhardt's butt cream   Topical BID Regalado, Belkys A, MD       HYDROmorphone  (DILAUDID ) injection 0.5 mg  0.5 mg Intravenous Q4H PRN Regalado, Belkys A, MD       HYDROmorphone  (DILAUDID ) tablet 1 mg  1 mg Oral Q4H PRN Regalado, Belkys A, MD   1 mg at 12/06/23 1224   HYDROmorphone  (DILAUDID ) tablet 1 mg   1 mg Oral Once Regalado, Belkys A, MD       insulin  aspart (novoLOG ) injection 0-5 Units  0-5 Units Subcutaneous QHS Timothy Ford, MD       insulin  aspart (novoLOG ) injection 0-6 Units  0-6 Units Subcutaneous TID WC Duda, Marcus V, MD   1 Units at 12/04/23 1156   leptospermum manuka honey (MEDIHONEY) paste 1 Application  1 Application Topical Daily Regalado, Belkys A, MD       LORazepam  (ATIVAN ) injection 1 mg  1 mg Intravenous Q8H PRN  Rella Cardinal, FNP   1 mg at 11/28/23 2103   metoprolol  tartrate (LOPRESSOR ) tablet 50 mg  50 mg Oral BID Duda, Marcus V, MD   50 mg at 12/05/23 2158   naloxone  (NARCAN ) injection 0.4 mg  0.4 mg Intravenous PRN Collins, Samantha G, PA-C   0.4 mg at 11/29/23 1401   nutrition supplement (JUVEN) (JUVEN) powder packet 1 packet  1 packet Oral BID BM Duda, Marcus V, MD   1 packet at 12/06/23 1323   ondansetron  (ZOFRAN ) tablet 4 mg  4 mg Oral Q6H PRN Timothy Ford, MD   4 mg at 11/30/23 1245   Or   ondansetron  (ZOFRAN ) injection 4 mg  4 mg Intravenous Q6H PRN Timothy Ford, MD   4 mg at 12/04/23 1600   prochlorperazine  (COMPAZINE ) injection 10 mg  10 mg Intravenous Q6H PRN Sundil, Subrina, MD   10 mg at 12/04/23 1753   sodium chloride  flush (NS) 0.9 % injection 10-40 mL  10-40 mL Intracatheter Q12H Pahwani, Martha Slack, MD   10 mL at 12/05/23 2256   sodium chloride  flush (NS) 0.9 % injection 10-40 mL  10-40 mL Intracatheter PRN Modena Andes, MD       sorbitol  70 % solution 30 mL  30 mL Oral PRN Timothy Ford, MD   30 mL at 11/29/23 1600   thiamine  (VITAMIN B1) tablet 100 mg  100 mg Oral Daily Chen, Lydia D, RPH   100 mg at 12/05/23 1007   zinc  sulfate (50mg  elemental zinc ) capsule 220 mg  220 mg Oral Daily Duda, Marcus V, MD   220 mg at 12/04/23 0981     Discharge Medications: Please see discharge summary for a list of discharge medications.  Relevant Imaging Results:  Relevant Lab Results:   Additional Information SS#: [id number]; HD at [facility]  11:40 chair time  Tandy Fam, LCSW

## 2023-12-07 NOTE — Discharge Planning (Signed)
 [facility] Patient Discharge Orders- [facility] CLINIC: [facility]  Patient's name: [name] [name] Admit/DC Dates: 11/21/2023 - 12/06/2023  Discharge Diagnoses: S. L BKA for non-healing foot infection   Encephalopathy d/t multifocal stroke  Aranesp : Given: No (was ordered but not given)   Date and amount of last dose: N/A  Last Hgb: 11.3 PRBC's Given: Yes Date/# of units: 2 units of 11/27/23 ESA dose for discharge: mircera 200 mcg IV q 2 weeks  IV Iron  dose at discharge: none  Heparin  change: yes- reduce to heparin  2000 unit bolus, now on eliquis   EDW Change: Yes New EDW:  62.5kg  Bath Change: no  Access intervention/Change: no Details:  Hectorol/Calcitriol  change: no  Discharge Labs: Calcium  8.1 Phosphorus 2.8 Albumin  2.3 K+ 4.6  IV Antibiotics: no Details:  On Coumadin?: no Last INR: Next INR: Managed By:   OTHER/APPTS/LAB ORDERS:    D/C Meds to be reconciled by nurse after every discharge.  Completed By: Ramona Burner, PA-C 12/07/2023, 10:16 AM  [facility] Pager: [phone]   Reviewed by: MD:______ RN_______

## 2023-12-12 ENCOUNTER — Ambulatory Visit (INDEPENDENT_AMBULATORY_CARE_PROVIDER_SITE_OTHER): Admitting: Family

## 2023-12-12 DIAGNOSIS — Z89512 Acquired absence of left leg below knee: Secondary | ICD-10-CM

## 2023-12-12 NOTE — Progress Notes (Signed)
 Post-Op Visit Note   Patient: [name] [name]           Date of Birth: February 25, 1966           MRN: [id number] Visit Date: 12/12/2023 PCP: Alwyn Juba., MD  Chief Complaint:  Chief Complaint  Patient presents with   Left Leg - Routine Post Op    11/25/2023 left leg BKA     HPI:  HPI The patient is a 58 year old woman seen status post left below-knee amputation May 23 her wound VAC was discontinued at the hospital prior to hospital discharge.  She is currently residing at skilled nursing she is currently on hydromorphone  every 6 hours for pain and feels her pain is poorly controlled. Ortho Exam On examination left residual limb the incision itself is healing well staples are in place there is no gaping or drainage she however does have a ischemic ulcer distal to the incision there is an island of eschar measuring 4 cm in diameter there is mild surrounding erythema without warmth no drainage Visit Diagnoses: No diagnosis found.  Plan: Harvested about two thirds the staples will harvest remaining staples at next visit continue to cleanse residual limb daily with Dial soap dry dressing shrinker around-the-clock  Continue Eliquis .  Continue with current pain regimen.  Follow-Up Instructions: No follow-ups on file.   Imaging: No results found.  Orders:  No orders of the defined types were placed in this encounter.  No orders of the defined types were placed in this encounter.    PMFS History: Patient Active Problem List   Diagnosis Date Noted   Osteomyelitis of left foot (HCC) 11/24/2023   Cellulitis of left lower extremity 11/21/2023   Anemia due to chronic kidney disease 11/21/2023   Hammertoes of both feet 05/21/2022   Hyperpigmentation 05/21/2022   Hypertensive emergency 05/20/2022   Hypertensive urgency, malignant 05/19/2022   Pyocystis 05/19/2022   Hypokalemia 05/19/2022   Cellulitis 04/09/2021   Cellulitis of suprapubic region 04/09/2021   Leukocytosis 04/09/2021    DM2 (diabetes mellitus, type 2) (HCC) 04/09/2021   Osteoporosis 10/13/2020   Fever    Closed fracture of left proximal tibia 10/10/2020   Fracture of femoral neck, right (HCC) 10/02/2020   Pressure injury of skin 03/12/2020   Sepsis (HCC) 03/07/2020   ESRD (end stage renal disease) (HCC) 03/07/2020   Hyperkalemia 03/07/2020   Acute cystitis 03/07/2020   Acute metabolic encephalopathy 03/07/2020   Impaired vision in both eyes 11/19/2015   Diabetes mellitus with complication (HCC)    Intractable headache    Syncope 07/28/2015   Chronic headaches 07/28/2015   Chest pain 05/22/2015   Urinary frequency 03/13/2015   Easy bruising 10/09/2014   Encounter for preventative adult health care exam with abnormal findings 01/11/2014   Chronic constipation 01/11/2014   GERD (gastroesophageal reflux disease) 01/11/2014   Helicobacter positive gastritis 12/06/2013   Dizziness and giddiness 10/12/2013   Essential hypertension 09/19/2013   Back pain 06/11/2013   Lumbosacral spondylosis without myelopathy 02/20/2013   Diabetic neuropathy (HCC) 01/09/2013   DM (diabetes mellitus), type 2, uncontrolled 10/02/2012   MITRAL REGURGITATION, mild 10/31/2009   Pleural effusion 10/20/2009   Past Medical History:  Diagnosis Date   Colitis    Diabetes mellitus    ESRD (end stage renal disease) (HCC)    on HD (M,W,F)   Family history of adverse reaction to anesthesia    " my uncle's heart stoped "   Fracture of femoral neck, right (HCC)  10/02/2020   GERD (gastroesophageal reflux disease)    Heart murmur    Hypertension    Impaired vision in both eyes 11/19/2015   Mitral regurgitation    Neuropathy in diabetes [facility])    Syncope and collapse 07/28/2015    Family History  Problem Relation Age of Onset   Diabetes Father    Heart attack Father    Aneurysm Mother     Past Surgical History:  Procedure Laterality Date   ABDOMINAL HYSTERECTOMY  1999   for fibroids. she thinks one ovary was left    AMPUTATION Left 11/25/2023   Procedure: AMPUTATION BELOW KNEE LEFT;  Surgeon: Timothy Ford, MD;  Location: [facility] OR;  Service: Orthopedics;  Laterality: Left;   CATARACT EXTRACTION     CESAREAN SECTION     CHOLECYSTECTOMY     EYE SURGERY Left retinal   GANGLION CYST EXCISION     HIP PINNING,CANNULATED Right 10/02/2020   Procedure: CANNULATED HIP PINNING;  Surgeon: Hardy Lia, MD;  Location: [facility] OR;  Service: Orthopedics;  Laterality: Right;   INCISION AND DRAINAGE ABSCESS N/A 04/10/2021   Procedure: INCISION AND DRAINAGE ABSCESS LABIA;  Surgeon: Caralyn Chandler, MD;  Location: [facility] OR;  Service: General;  Laterality: N/A;   IR FLUORO GUIDE CV LINE LEFT  10/13/2020   IR PTA VENOUS EXCEPT DIALYSIS CIRCUIT  10/13/2020   ORIF TIBIA PLATEAU Left 10/09/2020   Procedure: OPEN REDUCTION INTERNAL FIXATION (ORIF) TIBIAL PLATEAU;  Surgeon: Hardy Lia, MD;  Location: [facility] OR;  Service: Orthopedics;  Laterality: Left;   TONSILLECTOMY     TUBAL LIGATION     Social History   Occupational History   Occupation: Disabled  Tobacco Use   Smoking status: Never   Smokeless tobacco: Never  Substance and Sexual Activity   Alcohol use: No    Alcohol/week: 0.0 standard drinks of alcohol   Drug use: No   Sexual activity: Yes

## 2023-12-28 ENCOUNTER — Ambulatory Visit: Admitting: Family

## 2024-01-04 ENCOUNTER — Ambulatory Visit (INDEPENDENT_AMBULATORY_CARE_PROVIDER_SITE_OTHER): Admitting: Family

## 2024-01-04 ENCOUNTER — Encounter: Payer: Self-pay | Admitting: Family

## 2024-01-04 DIAGNOSIS — Z89512 Acquired absence of left leg below knee: Secondary | ICD-10-CM

## 2024-01-04 MED ORDER — GABAPENTIN 100 MG PO CAPS: 100.0000 mg | ORAL_CAPSULE | Freq: Three times a day (TID) | ORAL | 3 refills | Status: DC

## 2024-01-04 NOTE — Progress Notes (Signed)
 Post-Op Visit Note   Patient: [name] [name]           Date of Birth: 03-28-1966           MRN: [id number] Visit Date: 01/04/2024 PCP: Geroge Charlie CROME., MD  Chief Complaint:  Chief Complaint  Patient presents with   Left Leg - Routine Post Op    11/25/2023 left leg BKA     HPI:  HPI The patient the patient is a 58 year old woman who is seen status post below-knee amputation on the left.  She is currently residing at skilled nursing.  Ends of shooting electric jolt pain in her calf foot and ankle on the left   Patient is a new left transtibial  amputee.  Patient's current comorbidities are not expected to impact the ability to function with the prescribed prosthesis. Patient verbally communicates a strong desire to use a prosthesis. Patient currently requires mobility aids to ambulate without a prosthesis.  Expects not to use mobility aids with a new prosthesis.  Patient is a K3 level ambulator that spends a lot of time walking around on uneven terrain over obstacles, up and down stairs, and ambulates with a variable cadence.    Ortho Exam On examination left residual limb this is well consolidated incision well-healed Staples harvested today  No calf tenderness or erythema no edema  Visit Diagnoses: No diagnosis found.  Plan: Will begin gabapentin  for her phantom pain.  Given an order for her prosthesis set up she will follow-up in the office in 4 weeks Follow-Up Instructions: No follow-ups on file.   Imaging: No results found.  Orders:  No orders of the defined types were placed in this encounter.  Meds ordered this encounter  Medications   gabapentin  (NEURONTIN ) 100 MG capsule    Sig: Take 1 capsule (100 mg total) by mouth 3 (three) times daily. For 1 week take 1 capsule by mouth at bedtime, following week twice daily, following week 3 times daily    Dispense:  90 capsule    Refill:  3     PMFS History: Patient Active Problem List   Diagnosis Date Noted    Osteomyelitis of left foot (HCC) 11/24/2023   Cellulitis of left lower extremity 11/21/2023   Anemia due to chronic kidney disease 11/21/2023   Hammertoes of both feet 05/21/2022   Hyperpigmentation 05/21/2022   Hypertensive emergency 05/20/2022   Hypertensive urgency, malignant 05/19/2022   Pyocystis 05/19/2022   Hypokalemia 05/19/2022   Cellulitis 04/09/2021   Cellulitis of suprapubic region 04/09/2021   Leukocytosis 04/09/2021   DM2 (diabetes mellitus, type 2) (HCC) 04/09/2021   Osteoporosis 10/13/2020   Fever    Closed fracture of left proximal tibia 10/10/2020   Fracture of femoral neck, right (HCC) 10/02/2020   Pressure injury of skin 03/12/2020   Sepsis (HCC) 03/07/2020   ESRD (end stage renal disease) (HCC) 03/07/2020   Hyperkalemia 03/07/2020   Acute cystitis 03/07/2020   Acute metabolic encephalopathy 03/07/2020   Impaired vision in both eyes 11/19/2015   Diabetes mellitus with complication (HCC)    Intractable headache    Syncope 07/28/2015   Chronic headaches 07/28/2015   Chest pain 05/22/2015   Urinary frequency 03/13/2015   Easy bruising 10/09/2014   Encounter for preventative adult health care exam with abnormal findings 01/11/2014   Chronic constipation 01/11/2014   GERD (gastroesophageal reflux disease) 01/11/2014   Helicobacter positive gastritis 12/06/2013   Dizziness and giddiness 10/12/2013   Essential hypertension 09/19/2013  Back pain 06/11/2013   Lumbosacral spondylosis without myelopathy 02/20/2013   Diabetic neuropathy (HCC) 01/09/2013   DM (diabetes mellitus), type 2, uncontrolled 10/02/2012   MITRAL REGURGITATION, mild 10/31/2009   Pleural effusion 10/20/2009   Past Medical History:  Diagnosis Date   Colitis    Diabetes mellitus    ESRD (end stage renal disease) (HCC)    on HD (M,W,F)   Family history of adverse reaction to anesthesia     my uncle's heart stoped    Fracture of femoral neck, right (HCC) 10/02/2020   GERD  (gastroesophageal reflux disease)    Heart murmur    Hypertension    Impaired vision in both eyes 11/19/2015   Mitral regurgitation    Neuropathy in diabetes (HCC)    Syncope and collapse 07/28/2015    Family History  Problem Relation Age of Onset   Diabetes Father    Heart attack Father    Aneurysm Mother     Past Surgical History:  Procedure Laterality Date   ABDOMINAL HYSTERECTOMY  1999   for fibroids. she thinks one ovary was left   AMPUTATION Left 11/25/2023   Procedure: AMPUTATION BELOW KNEE LEFT;  Surgeon: Harden Jerona GAILS, MD;  Location: [facility] OR;  Service: Orthopedics;  Laterality: Left;   CATARACT EXTRACTION     CESAREAN SECTION     CHOLECYSTECTOMY     EYE SURGERY Left retinal   GANGLION CYST EXCISION     HIP PINNING,CANNULATED Right 10/02/2020   Procedure: CANNULATED HIP PINNING;  Surgeon: Celena Sharper, MD;  Location: [facility] OR;  Service: Orthopedics;  Laterality: Right;   INCISION AND DRAINAGE ABSCESS N/A 04/10/2021   Procedure: INCISION AND DRAINAGE ABSCESS LABIA;  Surgeon: Curvin Deward MOULD, MD;  Location: [facility] OR;  Service: General;  Laterality: N/A;   IR FLUORO GUIDE CV LINE LEFT  10/13/2020   IR PTA VENOUS EXCEPT DIALYSIS CIRCUIT  10/13/2020   ORIF TIBIA PLATEAU Left 10/09/2020   Procedure: OPEN REDUCTION INTERNAL FIXATION (ORIF) TIBIAL PLATEAU;  Surgeon: Celena Sharper, MD;  Location: [facility] OR;  Service: Orthopedics;  Laterality: Left;   TONSILLECTOMY     TUBAL LIGATION     Social History   Occupational History   Occupation: Disabled  Tobacco Use   Smoking status: Never   Smokeless tobacco: Never  Substance and Sexual Activity   Alcohol use: No    Alcohol/week: 0.0 standard drinks of alcohol   Drug use: No   Sexual activity: Yes

## 2024-01-09 ENCOUNTER — Telehealth: Payer: Self-pay | Admitting: Orthopedic Surgery

## 2024-01-09 NOTE — Telephone Encounter (Signed)
 Chris the wound care nurse called and said she needs shrinkers and [facility] needs a order so they can come out and get her sized. CB#[phone]

## 2024-01-10 ENCOUNTER — Telehealth: Payer: Self-pay | Admitting: Orthopedic Surgery

## 2024-01-10 NOTE — Telephone Encounter (Signed)
 Chris wound care nurse called about an update if referral has been sent to [facility] for shrinkers for pt. Pt phone number is [phone]. Please call [place] when sent.

## 2024-01-11 NOTE — Telephone Encounter (Signed)
 Rocky [address] [place] and informed him that she wrote another Rx and we will have this faxed directly to [facility].

## 2024-01-24 ENCOUNTER — Inpatient Hospital Stay (HOSPITAL_COMMUNITY)
Admission: EM | Admit: 2024-01-24 | Discharge: 2024-02-03 | DRG: 539 | Disposition: A | Discharge status: Skilled Nursing Facility | Attending: Family Medicine | Admitting: Family Medicine

## 2024-01-24 ENCOUNTER — Emergency Department (HOSPITAL_COMMUNITY)

## 2024-01-24 DIAGNOSIS — E1122 Type 2 diabetes mellitus with diabetic chronic kidney disease: Secondary | ICD-10-CM | POA: Diagnosis present

## 2024-01-24 DIAGNOSIS — Z7901 Long term (current) use of anticoagulants: Secondary | ICD-10-CM

## 2024-01-24 DIAGNOSIS — N186 End stage renal disease: Secondary | ICD-10-CM | POA: Diagnosis present

## 2024-01-24 DIAGNOSIS — Z886 Allergy status to analgesic agent status: Secondary | ICD-10-CM

## 2024-01-24 DIAGNOSIS — Z8673 Personal history of transient ischemic attack (TIA), and cerebral infarction without residual deficits: Secondary | ICD-10-CM

## 2024-01-24 DIAGNOSIS — F32A Depression, unspecified: Secondary | ICD-10-CM | POA: Diagnosis present

## 2024-01-24 DIAGNOSIS — Z7401 Bed confinement status: Secondary | ICD-10-CM

## 2024-01-24 DIAGNOSIS — E1142 Type 2 diabetes mellitus with diabetic polyneuropathy: Secondary | ICD-10-CM | POA: Diagnosis present

## 2024-01-24 DIAGNOSIS — Z833 Family history of diabetes mellitus: Secondary | ICD-10-CM

## 2024-01-24 DIAGNOSIS — Z881 Allergy status to other antibiotic agents status: Secondary | ICD-10-CM | POA: Diagnosis not present

## 2024-01-24 DIAGNOSIS — E1169 Type 2 diabetes mellitus with other specified complication: Secondary | ICD-10-CM | POA: Diagnosis present

## 2024-01-24 DIAGNOSIS — D631 Anemia in chronic kidney disease: Secondary | ICD-10-CM | POA: Diagnosis present

## 2024-01-24 DIAGNOSIS — Z794 Long term (current) use of insulin: Secondary | ICD-10-CM | POA: Diagnosis not present

## 2024-01-24 DIAGNOSIS — N1330 Unspecified hydronephrosis: Secondary | ICD-10-CM | POA: Diagnosis present

## 2024-01-24 DIAGNOSIS — K5909 Other constipation: Secondary | ICD-10-CM | POA: Diagnosis present

## 2024-01-24 DIAGNOSIS — M4628 Osteomyelitis of vertebra, sacral and sacrococcygeal region: Secondary | ICD-10-CM | POA: Diagnosis present

## 2024-01-24 DIAGNOSIS — Z885 Allergy status to narcotic agent status: Secondary | ICD-10-CM

## 2024-01-24 DIAGNOSIS — Z992 Dependence on renal dialysis: Secondary | ICD-10-CM | POA: Diagnosis not present

## 2024-01-24 DIAGNOSIS — Z8249 Family history of ischemic heart disease and other diseases of the circulatory system: Secondary | ICD-10-CM

## 2024-01-24 DIAGNOSIS — E119 Type 2 diabetes mellitus without complications: Secondary | ICD-10-CM

## 2024-01-24 DIAGNOSIS — K5900 Constipation, unspecified: Secondary | ICD-10-CM | POA: Insufficient documentation

## 2024-01-24 DIAGNOSIS — K529 Noninfective gastroenteritis and colitis, unspecified: Secondary | ICD-10-CM

## 2024-01-24 DIAGNOSIS — K219 Gastro-esophageal reflux disease without esophagitis: Secondary | ICD-10-CM | POA: Diagnosis present

## 2024-01-24 DIAGNOSIS — Z90710 Acquired absence of both cervix and uterus: Secondary | ICD-10-CM | POA: Diagnosis not present

## 2024-01-24 DIAGNOSIS — L89153 Pressure ulcer of sacral region, stage 3: Principal | ICD-10-CM | POA: Diagnosis present

## 2024-01-24 DIAGNOSIS — N3090 Cystitis, unspecified without hematuria: Secondary | ICD-10-CM | POA: Diagnosis present

## 2024-01-24 DIAGNOSIS — R54 Age-related physical debility: Secondary | ICD-10-CM | POA: Diagnosis present

## 2024-01-24 DIAGNOSIS — Z79899 Other long term (current) drug therapy: Secondary | ICD-10-CM | POA: Diagnosis not present

## 2024-01-24 DIAGNOSIS — H543 Unqualified visual loss, both eyes: Secondary | ICD-10-CM | POA: Diagnosis present

## 2024-01-24 DIAGNOSIS — Z89512 Acquired absence of left leg below knee: Secondary | ICD-10-CM | POA: Diagnosis not present

## 2024-01-24 DIAGNOSIS — Z888 Allergy status to other drugs, medicaments and biological substances status: Secondary | ICD-10-CM | POA: Diagnosis not present

## 2024-01-24 DIAGNOSIS — I120 Hypertensive chronic kidney disease with stage 5 chronic kidney disease or end stage renal disease: Secondary | ICD-10-CM | POA: Diagnosis present

## 2024-01-24 LAB — CBC WITH DIFFERENTIAL/PLATELET
Abs Immature Granulocytes: 0 K/uL (ref 0.00–0.07)
Basophils Absolute: 0 K/uL (ref 0.0–0.1)
Basophils Relative: 0 %
Eosinophils Absolute: 0 K/uL (ref 0.0–0.5)
Eosinophils Relative: 0 %
HCT: 34.3 % — ABNORMAL LOW (ref 36.0–46.0)
Hemoglobin: 10.4 g/dL — ABNORMAL LOW (ref 12.0–15.0)
Lymphocytes Relative: 4 %
Lymphs Abs: 1.2 K/uL (ref 0.7–4.0)
MCH: 23.6 pg — ABNORMAL LOW (ref 26.0–34.0)
MCHC: 30.3 g/dL (ref 30.0–36.0)
MCV: 77.8 fL — ABNORMAL LOW (ref 80.0–100.0)
Monocytes Absolute: 1.2 K/uL — ABNORMAL HIGH (ref 0.1–1.0)
Monocytes Relative: 4 %
Neutro Abs: 27.2 K/uL — ABNORMAL HIGH (ref 1.7–7.7)
Neutrophils Relative %: 92 %
Platelets: 318 K/uL (ref 150–400)
RBC: 4.41 MIL/uL (ref 3.87–5.11)
RDW: 21.2 % — ABNORMAL HIGH (ref 11.5–15.5)
WBC: 29.6 K/uL — ABNORMAL HIGH (ref 4.0–10.5)
nRBC: 0 /100{WBCs}
nRBC: 0.1 % (ref 0.0–0.2)

## 2024-01-24 LAB — COMPREHENSIVE METABOLIC PANEL WITH GFR
ALT: 7 U/L (ref 0–44)
AST: 19 U/L (ref 15–41)
Albumin: 2.1 g/dL — ABNORMAL LOW (ref 3.5–5.0)
Alkaline Phosphatase: 161 U/L — ABNORMAL HIGH (ref 38–126)
Anion gap: 13 (ref 5–15)
BUN: 18 mg/dL (ref 6–20)
CO2: 24 mmol/L (ref 22–32)
Calcium: 8.1 mg/dL — ABNORMAL LOW (ref 8.9–10.3)
Chloride: 97 mmol/L — ABNORMAL LOW (ref 98–111)
Creatinine, Ser: 3.36 mg/dL — ABNORMAL HIGH (ref 0.44–1.00)
GFR, Estimated: 15 mL/min — ABNORMAL LOW (ref >60)
Glucose, Bld: 188 mg/dL — ABNORMAL HIGH (ref 70–99)
Potassium: 4.5 mmol/L (ref 3.5–5.1)
Sodium: 134 mmol/L — ABNORMAL LOW (ref 135–145)
Total Bilirubin: 1 mg/dL (ref 0.0–1.2)
Total Protein: 7.4 g/dL (ref 6.5–8.1)

## 2024-01-24 LAB — TYPE AND SCREEN
ABO/RH(D): O POS
Antibody Screen: NEGATIVE

## 2024-01-24 MED ORDER — POLYETHYLENE GLYCOL 3350 17 G PO PACK
17.0000 g | PACK | Freq: Every day | ORAL | Status: DC
  Administered 2024-01-24 – 2024-01-27 (×4): 17 g via ORAL
  Filled 2024-01-24 (×5): qty 1

## 2024-01-24 MED ORDER — VANCOMYCIN HCL 1250 MG/250ML IV SOLN
1250.0000 mg | Freq: Once | INTRAVENOUS | Status: AC
  Administered 2024-01-24: 1250 mg via INTRAVENOUS
  Filled 2024-01-24: qty 250

## 2024-01-24 MED ORDER — PIPERACILLIN-TAZOBACTAM IN DEX 2-0.25 GM/50ML IV SOLN
2.2500 g | Freq: Three times a day (TID) | INTRAVENOUS | Status: DC
  Administered 2024-01-25 – 2024-01-30 (×15): 2.25 g via INTRAVENOUS
  Filled 2024-01-24 (×19): qty 50

## 2024-01-24 MED ORDER — PIPERACILLIN-TAZOBACTAM 3.375 G IVPB 30 MIN: 3.3750 g | Freq: Once | INTRAVENOUS | Status: DC

## 2024-01-24 MED ORDER — INSULIN ASPART 100 UNIT/ML IJ SOLN
0.0000 [IU] | INTRAMUSCULAR | Status: DC
  Administered 2024-01-25: 2 [IU] via SUBCUTANEOUS

## 2024-01-24 MED ORDER — RENA-VITE PO TABS
1.0000 | ORAL_TABLET | Freq: Every day | ORAL | Status: DC
  Administered 2024-01-25 – 2024-02-02 (×7): 1 via ORAL
  Filled 2024-01-24 (×10): qty 1

## 2024-01-24 MED ORDER — AMLODIPINE BESYLATE 5 MG PO TABS: 5.0000 mg | ORAL_TABLET | Freq: Every evening | ORAL | Status: DC

## 2024-01-24 MED ORDER — ATORVASTATIN CALCIUM 10 MG PO TABS
20.0000 mg | ORAL_TABLET | Freq: Every day | ORAL | Status: DC
  Administered 2024-01-25 – 2024-02-03 (×10): 20 mg via ORAL
  Filled 2024-01-24 (×10): qty 2

## 2024-01-24 MED ORDER — CLONIDINE HCL 0.3 MG/24HR TD PTWK
0.3000 mg | MEDICATED_PATCH | TRANSDERMAL | Status: DC
  Administered 2024-01-25: 0.3 mg via TRANSDERMAL
  Filled 2024-01-24: qty 1

## 2024-01-24 MED ORDER — ISOSORBIDE MONONITRATE ER 30 MG PO TB24
30.0000 mg | ORAL_TABLET | ORAL | Status: DC
  Administered 2024-01-25 – 2024-01-27 (×2): 30 mg via ORAL
  Filled 2024-01-24 (×2): qty 1

## 2024-01-24 MED ORDER — PANTOPRAZOLE SODIUM 40 MG PO TBEC
40.0000 mg | DELAYED_RELEASE_TABLET | Freq: Every day | ORAL | Status: DC
  Administered 2024-01-25 – 2024-02-03 (×9): 40 mg via ORAL
  Filled 2024-01-24 (×10): qty 1

## 2024-01-24 MED ORDER — CALCIUM CARBONATE ANTACID 500 MG PO CHEW
1.0000 | CHEWABLE_TABLET | Freq: Three times a day (TID) | ORAL | Status: DC
  Administered 2024-01-25: 200 mg via ORAL
  Filled 2024-01-24: qty 1

## 2024-01-24 MED ORDER — GABAPENTIN 100 MG PO CAPS
100.0000 mg | ORAL_CAPSULE | Freq: Three times a day (TID) | ORAL | Status: DC
  Administered 2024-01-25 – 2024-02-03 (×23): 100 mg via ORAL
  Filled 2024-01-24 (×25): qty 1

## 2024-01-24 MED ORDER — PIPERACILLIN-TAZOBACTAM IN DEX 2-0.25 GM/50ML IV SOLN
2.2500 g | Freq: Once | INTRAVENOUS | Status: AC
  Administered 2024-01-24: 2.25 g via INTRAVENOUS
  Filled 2024-01-24: qty 50

## 2024-01-24 MED ORDER — HYDROXYZINE HCL 10 MG PO TABS
10.0000 mg | ORAL_TABLET | Freq: Three times a day (TID) | ORAL | Status: DC
  Administered 2024-01-25 – 2024-01-29 (×10): 10 mg via ORAL
  Filled 2024-01-24 (×13): qty 1

## 2024-01-24 MED ORDER — CALCITRIOL 0.25 MCG PO CAPS
0.2500 ug | ORAL_CAPSULE | ORAL | Status: DC
  Administered 2024-01-31 – 2024-02-02 (×2): 0.25 ug via ORAL
  Filled 2024-01-24 (×6): qty 1

## 2024-01-24 MED ORDER — VANCOMYCIN HCL 500 MG/100ML IV SOLN
500.0000 mg | INTRAVENOUS | Status: DC
  Administered 2024-01-26 – 2024-01-28 (×2): 500 mg via INTRAVENOUS
  Filled 2024-01-24 (×2): qty 100

## 2024-01-24 MED ORDER — DOCUSATE SODIUM 100 MG PO CAPS: 100.0000 mg | ORAL_CAPSULE | Freq: Every day | ORAL | Status: DC | PRN

## 2024-01-24 MED ORDER — DOCUSATE SODIUM 100 MG PO CAPS
100.0000 mg | ORAL_CAPSULE | Freq: Once | ORAL | Status: DC
  Administered 2024-01-24: 100 mg via ORAL
  Filled 2024-01-24: qty 1

## 2024-01-24 NOTE — Progress Notes (Signed)
 Pharmacy Antibiotic Note  [name] [name] is a 58 y.o. female admitted on 01/24/2024 with Infected large sacral decubitus ulcer with possible sacral osteomyelitis.  Pharmacy has been consulted for Vancomycin  and Zosyn  dosing. Recent hospital admission 5/19-12/06/2023 for infected diabetic foot ulcer with cellulitis/gangrene and gas-forming infection of the left heel/osteomyelitis status post left BKA.  ESRD - HD T/T/S  Plan: Zosyn  2.25mg  IV q8h Vancomycin   500 mg IV qHD Will f/u HD schedule, micro data, and pt's clinical condition Vanc levels prn   Height: 5' (152.4 cm) Weight: 59.9 kg (132 lb) IBW/kg (Calculated) : 45.5  Temp (24hrs), Avg:98.9 F (37.2 C), Min:98.9 F (37.2 C), Max:98.9 F (37.2 C)  Recent Labs  Lab 01/24/24 1826  WBC 29.6*  CREATININE 3.36*    Estimated Creatinine Clearance: 15 mL/min (A) (by C-G formula based on SCr of 3.36 mg/dL (H)).    Allergies  Allergen Reactions   Amitriptyline  Other (See Comments), Swelling and Nausea And Vomiting    Hypotension, Mental Status Changes, Confusion - Allergic, per MAR   Ciprofloxacin  Diarrhea, Nausea And Vomiting and Other (See Comments)    Severe vomiting requiring ED visit, IV reglan  and IV fluids- Allergic, per MAR   Ibuprofen  Itching   Aspirin  Itching and Other (See Comments)    Pt endorses getting itchy in the past with aspirin . Tolerated dose 04/06/22.   Na Ferric Gluc Cplx In Sucrose Other (See Comments)   Acetaminophen  Itching, Other (See Comments) and Nausea And Vomiting    Allergic, per MAR   Semaglutide Nausea Only and Other (See Comments)    Allergic, per MAR   Tramadol  Itching    Antimicrobials this admission: 7/22 Vanc >>  7/22 Zosyn  >>   Microbiology results: Pending  Thank you for allowing pharmacy to be a part of this patient's care.  Vito Ralph, PharmD, BCPS Please see amion for complete clinical pharmacist phone list 01/24/2024 11:43 PM

## 2024-01-24 NOTE — Consult Note (Signed)
 [name] [name] [birth year]  [id number].    Requesting MD: Mannie Chief Complaint/Reason for Consult: Sacral decub  HPI:  58 y/o F w/ a hx of DM, ESRD on iHD, GERD, chronic anemia, and mitral regurgitation who presented to the ED with concern for vaginal bleeding. On examination, she was found to have a sacral decub and the concern was that this was the source of her drainage/bleeding. She underwent a CT that shows stercoral colitis and a sacral decubitus ulcer with some gas within the muscle. Surgery has been consulted to evaluate. She reports that she is currently residing in a facility and that they have been doing daily dressing changes from her sacral wound. She reports fatigue and drainage but denies other complaints. Labs notable for a WBC of 30, Hb 10.4.   Of note, she was admitted in June for an infected diabetic foot ulcer and is now s/p left BKA. Her course was complicated by ischemic stroke and anemia requiring several transfusions.   ROS: Review of Systems  Constitutional:  Positive for malaise/fatigue.  HENT: Negative.    Eyes:        Blindness  Respiratory: Negative.    Cardiovascular: Negative.   Gastrointestinal: Negative.   Genitourinary: Negative.   Musculoskeletal: Negative.   Skin:        Sacral wound  Neurological: Negative.   Endo/Heme/Allergies: Negative.   Psychiatric/Behavioral: Negative.      Family History  Problem Relation Age of Onset   Diabetes Father    Heart attack Father    Aneurysm Mother     Past Medical History:  Diagnosis Date   Colitis    Diabetes mellitus    ESRD (end stage renal disease) (HCC)    on HD (M,W,F)   Family history of adverse reaction to anesthesia     my uncle's heart stoped    Fracture of femoral neck, right (HCC) 10/02/2020   GERD (gastroesophageal reflux disease)    Heart murmur    Hypertension    Impaired vision in both eyes 11/19/2015   Mitral regurgitation    Neuropathy in diabetes (HCC)    Syncope  and collapse 07/28/2015    Past Surgical History:  Procedure Laterality Date   ABDOMINAL HYSTERECTOMY  1999   for fibroids. she thinks one ovary was left   AMPUTATION Left 11/25/2023   Procedure: AMPUTATION BELOW KNEE LEFT;  Surgeon: Harden Jerona GAILS, MD;  Location[facility] OR;  Service: Orthopedics;  Laterality: Left;   CATARACT EXTRACTION     CESAREAN SECTION     CHOLECYSTECTOMY     EYE SURGERY Left retinal   GANGLION CYST EXCISION     HIP PINNING,CANNULATED Right 10/02/2020   Procedure: CANNULATED HIP PINNING;  Surgeon: Celena Sharper, MD;  Location: [facility] OR;  Service: Orthopedics;  Laterality: Right;   INCISION AND DRAINAGE ABSCESS N/A 04/10/2021   Procedure: INCISION AND DRAINAGE ABSCESS LABIA;  Surgeon: Curvin Deward MOULD, MD;  Location: [facility] OR;  Service: General;  Laterality: N/A;   IR FLUORO GUIDE CV LINE LEFT  10/13/2020   IR PTA VENOUS EXCEPT DIALYSIS CIRCUIT  10/13/2020   ORIF TIBIA PLATEAU Left 10/09/2020   Procedure: OPEN REDUCTION INTERNAL FIXATION (ORIF) TIBIAL PLATEAU;  Surgeon: Celena Sharper, MD;  Location: [facility] OR;  Service: Orthopedics;  Laterality: Left;   TONSILLECTOMY     TUBAL LIGATION      Social History:  reports that she has never smoked. She has never used smokeless tobacco. She reports that  she does not drink alcohol and does not use drugs.  Allergies:  Allergies  Allergen Reactions   Amitriptyline  Swelling and Other (See Comments)    Hypotension, Mental Status Changes, Confusion - Allergic, per MAR   Ciprofloxacin  Diarrhea, Nausea And Vomiting and Other (See Comments)    Severe vomiting requiring ED visit, IV reglan  and IV fluids- Allergic, per MAR   Ibuprofen  Itching   Aspirin  Itching and Other (See Comments)    Pt endorses getting itchy in the past with aspirin . Tolerated dose 04/06/22.   Semaglutide Nausea Only and Other (See Comments)    Allergic, per MAR   Tramadol  Itching   Tylenol  [Acetaminophen ] Itching and Other (See Comments)    Allergic, per [facility]     (Not in a hospital admission)   Physical Exam: Blood pressure (!) 117/51, resp. rate 10, height 5' (1.524 m), weight 59.9 kg. Gen: chronically ill appearing female, NAD Sacrum: wound base with some malodorous drainage, healthy wound base with minimal necrotic tissue at the base, no purulence, no significant erythema, no crepitus  Results for orders placed or performed during the hospital encounter of 01/24/24 (from the past 48 hours)  Type and screen [facility]     Status: None   Collection Time: 01/24/24  6:09 PM  Result Value Ref Range   ABO/RH(D) O POS    Antibody Screen NEG    Sample Expiration      01/27/2024,2359 Performed at [facility], [address] [place], [place] [address]   Comprehensive metabolic panel     Status: Abnormal   Collection Time: 01/24/24  6:26 PM  Result Value Ref Range   Sodium 134 (L) 135 - 145 mmol/L   Potassium 4.5 3.5 - 5.1 mmol/L   Chloride 97 (L) 98 - 111 mmol/L   CO2 24 22 - 32 mmol/L   Glucose, Bld 188 (H) 70 - 99 mg/dL    Comment: Glucose reference range applies only to samples taken after fasting for at least 8 hours.   BUN 18 6 - 20 mg/dL   Creatinine, Ser 6.63 (H) 0.44 - 1.00 mg/dL   Calcium  8.1 (L) 8.9 - 10.3 mg/dL   Total Protein 7.4 6.5 - 8.1 g/dL   Albumin  2.1 (L) 3.5 - 5.0 g/dL   AST 19 15 - 41 U/L   ALT 7 0 - 44 U/L   Alkaline Phosphatase 161 (H) 38 - 126 U/L   Total Bilirubin 1.0 0.0 - 1.2 mg/dL   GFR, Estimated 15 (L) >60 mL/min    Comment: (NOTE) Calculated using the CKD-EPI Creatinine Equation (2021)    Anion gap 13 5 - 15    Comment: Performed at [facility], [address], [place], [place] [address]  CBC with Differential     Status: Abnormal   Collection Time: 01/24/24  6:26 PM  Result Value Ref Range   WBC 29.6 (H) 4.0 - 10.5 K/uL   RBC 4.41 3.87 - 5.11 MIL/uL   Hemoglobin 10.4 (L) 12.0 - 15.0 g/dL   HCT 65.6 (L) 63.9 - 53.9 %   MCV 77.8 (L) 80.0 - 100.0 fL   MCH 23.6 (L)  26.0 - 34.0 pg   MCHC 30.3 30.0 - 36.0 g/dL   RDW 78.7 (H) 88.4 - 84.4 %   Platelets 318 150 - 400 K/uL   nRBC 0.1 0.0 - 0.2 %   Neutrophils Relative % 92 %   Neutro Abs 27.2 (H) 1.7 -  7.7 K/uL   Lymphocytes Relative 4 %   Lymphs Abs 1.2 0.7 - 4.0 K/uL   Monocytes Relative 4 %   Monocytes Absolute 1.2 (H) 0.1 - 1.0 K/uL   Eosinophils Relative 0 %   Eosinophils Absolute 0.0 0.0 - 0.5 K/uL   Basophils Relative 0 %   Basophils Absolute 0.0 0.0 - 0.1 K/uL   WBC Morphology See Note     Comment: Morphology unremarkable   Smear Review See Note     Comment: Platelets Appear Adequate.   nRBC 0 0 /100 WBC   Abs Immature Granulocytes 0.00 0.00 - 0.07 K/uL   Polychromasia PRESENT     Comment: Performed at [facility], [address], [place], [place] [address]   CT ABDOMEN PELVIS WO CONTRAST Result Date: 01/24/2024 CLINICAL DATA:  Acute generalized abdominal pain. EXAM: CT ABDOMEN AND PELVIS WITHOUT CONTRAST TECHNIQUE: Multidetector CT imaging of the abdomen and pelvis was performed following the standard protocol without IV contrast. RADIATION DOSE REDUCTION: This exam was performed according to the departmental dose-optimization program which includes automated exposure control, adjustment of the mA and/or kV according to patient size and/or use of iterative reconstruction technique. COMPARISON:  Dec 01, 2022. FINDINGS: Lower chest: No acute abnormality. Hepatobiliary: No focal liver abnormality is seen. Status post cholecystectomy. No biliary dilatation. Pancreas: Unremarkable. No pancreatic ductal dilatation or surrounding inflammatory changes. Spleen: Normal in size without focal abnormality. Adrenals/Urinary Tract: Adrenal glands appear normal. Bilateral renal atrophy is noted. Mild bilateral hydroureteronephrosis is noted without obstructing calculus. There remains significant wall thickening of the anterior and superior portion of urinary bladder which may represent cystitis, but neoplasm  cannot be excluded. Stomach/Bowel: The stomach is unremarkable. Large amount of stool is seen in the transverse, descending and sigmoid colon. Moderate wall thickening of distal sigmoid colon and rectum is noted suggesting proctocolitis. Vascular/Lymphatic: Aortic atherosclerosis. No enlarged abdominal or pelvic lymph nodes. Reproductive: Status post hysterectomy. No adnexal masses. Other: There appears to be interval development large sacral decubitus ulceration on the left side. Some gas is seen in the musculature of the gluteal region on the right suggesting infection. Musculoskeletal: Some degree of osteomyelitis involving the sacrum due to adjacent decubitus ulceration cannot be excluded. IMPRESSION: Interval development of large sacral decubitus ulceration is seen in the left side, with some gas extending into the musculature of the right gluteal region suggesting infection. Some degree of osteomyelitis involving the underlying sacrum cannot be excluded. Bilateral renal atrophy is noted. Mild bilateral hydroureteronephrosis is noted without obstructing calculus. There remains significant wall thickening involving the anterior and superior aspect of the urinary bladder which may represent cystitis, but neoplasm cannot be excluded. Large amount of stool is seen involving the distal portions of the colon. Moderate wall thickening of sigmoid colon and rectum is noted concerning for proctocolitis. Aortic Atherosclerosis (ICD10-I70.0). Electronically Signed   By: Lynwood Landy Raddle M.D.   On: 01/24/2024 17:49    Assessment/Plan 58 y/o M w/ a hx of DM, ESRD on iHD, GERD, chronic anemia, and mitral regurgitation who presented with drainage from a sacral wound and has evidence of stercoral colitis.  - No indication for surgical intervention for her sacral wound. The wound base is mostly healthy with minimal necrotic tissue and there is no evidence of NSTI.  - Would recommend wound consult and BID dressing changes  to help debride the wound.  - Enemas and aggressive bowel regimen to aid with constipation/stercoral colitis - Surgery will sign off.  Please reach out with any questions/concerns.  Cordella DELENA Polly Marlis Cheron Surgery 01/24/2024, 8:04 PM Please see Amion for pager number during day hours 7:00am-4:30pm or 7:00am -11:30am on weekends

## 2024-01-24 NOTE — ED Triage Notes (Signed)
 Vaginal bleeding x 2-3 months. Dark red blood. Blood was seen this morning by caregiver. N/V started today. Denies any chest pain, shob, dizziness, or headache.  Pt had dialysis today. Tue, Thurs, and Sat is her schedule.  120/70 BP 60 HR 18 RR 97% RA 198 CBG

## 2024-01-24 NOTE — ED Provider Notes (Signed)
 [place] EMERGENC[facility] DEPARTMENT AT [facility] Provider Note  CSN: [id number] Arrival date & time: 01/24/24 8367  Chief Complaint(s) Vaginal Bleeding  HPI [name] [name] is a 58 y.o. female who is here today for bleeding.  Patient with history of ESRD, is on Eliquis .  Reports for vaginal bleeding, however patient has a sacral wound, and is unclear if she is having vaginal bleeding or having blood from the wound.  They describe a dark blood in the patient's brief and changing it this morning.  Patient was seen by OB/GYN on the 14th of this month.  She had a pelvic exam which did not show any bleeding.  Patient with history of a vaginal hysterectomy.   Past Medical History Past Medical History:  Diagnosis Date   Colitis    Diabetes mellitus    ESRD (end stage renal disease) (HCC)    on HD (M,W,F)   Family history of adverse reaction to anesthesia     my uncle's heart stoped    Fracture of femoral neck, right (HCC) 10/02/2020   GERD (gastroesophageal reflux disease)    Heart murmur    Hypertension    Impaired vision in both eyes 11/19/2015   Mitral regurgitation    Neuropathy in diabetes (HCC)    Syncope and collapse 07/28/2015   Patient Active Problem List   Diagnosis Date Noted   Sacral osteomyelitis (HCC) 01/24/2024   Osteomyelitis of left foot (HCC) 11/24/2023   Cellulitis of left lower extremity 11/21/2023   Anemia due to chronic kidney disease 11/21/2023   Hammertoes of both feet 05/21/2022   Hyperpigmentation 05/21/2022   Hypertensive emergency 05/20/2022   Hypertensive urgency, malignant 05/19/2022   Pyocystis 05/19/2022   Hypokalemia 05/19/2022   Cellulitis 04/09/2021   Cellulitis of suprapubic region 04/09/2021   Leukocytosis 04/09/2021   DM2 (diabetes mellitus, type 2) (HCC) 04/09/2021   Osteoporosis 10/13/2020   Fever    Closed fracture of left proximal tibia 10/10/2020   Fracture of femoral neck, right (HCC) 10/02/2020   Pressure injury of skin  03/12/2020   Sepsis (HCC) 03/07/2020   ESRD (end stage renal disease) (HCC) 03/07/2020   Hyperkalemia 03/07/2020   Acute cystitis 03/07/2020   Acute metabolic encephalopathy 03/07/2020   Impaired vision in both eyes 11/19/2015   Diabetes mellitus with complication (HCC)    Intractable headache    Syncope 07/28/2015   Chronic headaches 07/28/2015   Chest pain 05/22/2015   Urinary frequency 03/13/2015   Easy bruising 10/09/2014   Encounter for preventative adult health care exam with abnormal findings 01/11/2014   Chronic constipation 01/11/2014   GERD (gastroesophageal reflux disease) 01/11/2014   Helicobacter positive gastritis 12/06/2013   Dizziness and giddiness 10/12/2013   Essential hypertension 09/19/2013   Back pain 06/11/2013   Lumbosacral spondylosis without myelopathy 02/20/2013   Diabetic neuropathy (HCC) 01/09/2013   DM (diabetes mellitus), type 2, uncontrolled 10/02/2012   MITRAL REGURGITATION, mild 10/31/2009   Pleural effusion 10/20/2009   Home Medication(s) Prior to Admission medications   Medication Sig Start Date End Date Taking? Authorizing Provider  amLODipine  (NORVASC ) 5 MG tablet Take 1 tablet (5 mg total) by mouth every evening. 12/06/23   Regalado, Belkys A, MD  ascorbic acid  (VITAMIN C ) 1000 MG tablet Take 1 tablet (1,000 mg total) by mouth daily. 12/06/23   Regalado, Belkys A, MD  atorvastatin  (LIPITOR) 20 MG tablet Take 1 tablet (20 mg total) by mouth daily. 12/06/23   Regalado, Owen LABOR, MD  calcitRIOL  (ROCALTROL )  0.25 MCG capsule Take 1 capsule (0.25 mcg total) by mouth Every Tuesday,Thursday,and Saturday with dialysis. 12/06/23   Regalado, Owen LABOR, MD  calcium  carbonate (TUMS) 500 MG chewable tablet Chew 1 tablet (200 mg of elemental calcium  total) by mouth 3 (three) times daily with meals. 12/06/23   Regalado, Belkys A, MD  cloNIDine  (CATAPRES  - DOSED IN MG/24 HR) 0.3 mg/24hr patch Place 0.3 mg onto the skin every Wednesday.    [provider]   ELIQUIS  5 MG TABS tablet Take 5 mg by mouth 2 (two) times daily. 11/21/23   [provider]  ferrous sulfate 325 (65 FE) MG EC tablet Take 325 mg by mouth in the morning and at bedtime.    [provider]  fluticasone (FLONASE) 50 MCG/ACT nasal spray Place 2 sprays into both nostrils in the morning and at bedtime.    [provider]  gabapentin  (NEURONTIN ) 100 MG capsule Take 1 capsule (100 mg total) by mouth 3 (three) times daily. For 1 week take 1 capsule by mouth at bedtime, following week twice daily, following week 3 times daily 01/04/24   Zamora, Erin R, NP  isosorbide  mononitrate (IMDUR ) 30 MG 24 hr tablet Take 30 mg by mouth See admin instructions. Take 30 mg by mouth at 8 AM on Sun/Mon/Wed/Fri 11/16/23   [provider]  lidocaine  (LIDODERM ) 5 % Place 1 patch onto the skin See admin instructions. Apply 1 new patch to bilateral feet at 8 AM daily- Remove & Discard patch within 12 hours or as directed by MD    [provider]  metoprolol  tartrate (LOPRESSOR ) 50 MG tablet Take 1 tablet (50 mg total) by mouth 2 (two) times daily. 12/06/23   Regalado, Belkys A, MD  multivitamin (RENA-VIT) TABS tablet Take 1 tablet by mouth daily at 8 pm.    [provider]  nystatin cream (MYCOSTATIN) Apply 1 Application topically See admin instructions. Apply to sacrum 2 times a day after cleansing ab=nd drying. Apply collagen particles after nystatin cream. 11/16/23   [provider]  omeprazole  (PRILOSEC) 40 MG capsule Take 40 mg by mouth in the morning and at bedtime.    [provider]  ondansetron  (ZOFRAN ) 4 MG tablet Take 1 tablet (4 mg total) by mouth every 8 (eight) hours as needed for nausea or vomiting. 05/22/22   Rai, Ripudeep K, MD  PROTEIN PO Take 30 mLs by mouth 2 (two) times daily.    [provider]  thiamine  (VITAMIN B-1) 100 MG tablet Take 1 tablet (100 mg total) by mouth daily. 12/06/23   Regalado, Belkys A, MD  zinc   sulfate, 50mg  elemental zinc , 220 (50 Zn) MG capsule Take 1 capsule (220 mg total) by mouth daily. 12/06/23   Regalado, Owen LABOR, MD  Past Surgical History Past Surgical History:  Procedure Laterality Date   ABDOMINAL HYSTERECTOMY  1999   for fibroids. she thinks one ovary was left   AMPUTATION Left 11/25/2023   Procedure: AMPUTATION BELOW KNEE LEFT;  Surgeon: Harden Jerona GAILS, MD;  Location: [facility] OR;  Service: Orthopedics;  Laterality: Left;   CATARACT EXTRACTION     CESAREAN SECTION     CHOLECYSTECTOMY     EYE SURGERY Left retinal   GANGLION CYST EXCISION     HIP PINNING,CANNULATED Right 10/02/2020   Procedure: CANNULATED HIP PINNING;  Surgeon: Celena Sharper, MD;  Location: [facility] OR;  Service: Orthopedics;  Laterality: Right;   INCISION AND DRAINAGE ABSCESS N/A 04/10/2021   Procedure: INCISION AND DRAINAGE ABSCESS LABIA;  Surgeon: Curvin Deward MOULD, MD;  Location: [facility] OR;  Service: General;  Laterality: N/A;   IR FLUORO GUIDE CV LINE LEFT  10/13/2020   IR PTA VENOUS EXCEPT DIALYSIS CIRCUIT  10/13/2020   ORIF TIBIA PLATEAU Left 10/09/2020   Procedure: OPEN REDUCTION INTERNAL FIXATION (ORIF) TIBIAL PLATEAU;  Surgeon: Celena Sharper, MD;  Location: [facility] OR;  Service: Orthopedics;  Laterality: Left;   TONSILLECTOMY     TUBAL LIGATION     Family History Family History  Problem Relation Age of Onset   Diabetes Father    Heart attack Father    Aneurysm Mother     Social History Social History   Tobacco Use   Smoking status: Never   Smokeless tobacco: Never  Substance Use Topics   Alcohol use: No    Alcohol/week: 0.0 standard drinks of alcohol   Drug use: No   Allergies Amitriptyline , Ciprofloxacin , Ibuprofen , Aspirin , Semaglutide, Tramadol , and Tylenol  [acetaminophen ]  Review of Systems Review of Systems  Physical Exam Vital Signs  I have reviewed the triage  vital signs BP (!) 176/62   Pulse 61   Temp 98.9 F (37.2 C) (Oral)   Resp 13   Ht 5' (1.524 m)   Wt 59.9 kg   SpO2 97%   BMI 25.78 kg/m   Physical Exam Vitals reviewed.  Constitutional:      Comments: Frail  Cardiovascular:     Rate and Rhythm: Normal rate.  Abdominal:     General: Abdomen is flat.     Palpations: Abdomen is soft.  Skin:    Comments: Stage III sacral decubitus ulcer.  No surrounding erythema.  Scant discharge.  Neurological:     Mental Status: She is alert.     ED Results and Treatments Labs (all labs ordered are listed, but only abnormal results are displayed) Labs Reviewed  COMPREHENSIVE METABOLIC PANEL WITH GFR - Abnormal; Notable for the following components:      Result Value   Sodium 134 (*)    Chloride 97 (*)    Glucose, Bld 188 (*)    Creatinine, Ser 3.36 (*)    Calcium  8.1 (*)    Albumin  2.1 (*)    Alkaline Phosphatase 161 (*)    GFR, Estimated 15 (*)    All other components within normal limits  CBC WITH DIFFERENTIAL/PLATELET - Abnormal; Notable for the following components:   WBC 29.6 (*)    Hemoglobin 10.4 (*)    HCT 34.3 (*)    MCV 77.8 (*)    MCH 23.6 (*)    RDW 21.2 (*)    Neutro Abs 27.2 (*)    Monocytes Absolute 1.2 (*)    All other components within normal limits  TYPE AND SCREEN  Radiology CT ABDOMEN PELVIS WO CONTRAST Result Date: 01/24/2024 CLINICAL DATA:  Acute generalized abdominal pain. EXAM: CT ABDOMEN AND PELVIS WITHOUT CONTRAST TECHNIQUE: Multidetector CT imaging of the abdomen and pelvis was performed following the standard protocol without IV contrast. RADIATION DOSE REDUCTION: This exam was performed according to the departmental dose-optimization program which includes automated exposure control, adjustment of the mA and/or kV according to patient size and/or use of iterative reconstruction  technique. COMPARISON:  Dec 01, 2022. FINDINGS: Lower chest: No acute abnormality. Hepatobiliary: No focal liver abnormality is seen. Status post cholecystectomy. No biliary dilatation. Pancreas: Unremarkable. No pancreatic ductal dilatation or surrounding inflammatory changes. Spleen: Normal in size without focal abnormality. Adrenals/Urinary Tract: Adrenal glands appear normal. Bilateral renal atrophy is noted. Mild bilateral hydroureteronephrosis is noted without obstructing calculus. There remains significant wall thickening of the anterior and superior portion of urinary bladder which may represent cystitis, but neoplasm cannot be excluded. Stomach/Bowel: The stomach is unremarkable. Large amount of stool is seen in the transverse, descending and sigmoid colon. Moderate wall thickening of distal sigmoid colon and rectum is noted suggesting proctocolitis. Vascular/Lymphatic: Aortic atherosclerosis. No enlarged abdominal or pelvic lymph nodes. Reproductive: Status post hysterectomy. No adnexal masses. Other: There appears to be interval development large sacral decubitus ulceration on the left side. Some gas is seen in the musculature of the gluteal region on the right suggesting infection. Musculoskeletal: Some degree of osteomyelitis involving the sacrum due to adjacent decubitus ulceration cannot be excluded. IMPRESSION: Interval development of large sacral decubitus ulceration is seen in the left side, with some gas extending into the musculature of the right gluteal region suggesting infection. Some degree of osteomyelitis involving the underlying sacrum cannot be excluded. Bilateral renal atrophy is noted. Mild bilateral hydroureteronephrosis is noted without obstructing calculus. There remains significant wall thickening involving the anterior and superior aspect of the urinary bladder which may represent cystitis, but neoplasm cannot be excluded. Large amount of stool is seen involving the distal portions  of the colon. Moderate wall thickening of sigmoid colon and rectum is noted concerning for proctocolitis. Aortic Atherosclerosis (ICD10-I70.0). Electronically Signed   By: Lynwood Landy Raddle M.D.   On: 01/24/2024 17:49    Pertinent labs & imaging results that were available during my care of the patient were reviewed by me and considered in my medical decision making (see MDM for details).  Medications Ordered in ED Medications  vancomycin  (VANCOREADY) IVPB 1250 mg/250 mL (has no administration in time range)  piperacillin -tazobactam (ZOSYN ) IVPB 2.25 g (2.25 g Intravenous New Bag/Given 01/24/24 2003)  docusate sodium  (COLACE) capsule 100 mg (has no administration in time range)  polyethylene glycol (MIRALAX  / GLYCOLAX ) packet 17 g (has no administration in time range)                                                                                                                                     Procedures  Procedures  (including critical care time)  Medical Decision Making / ED Course   This patient presents to the ED for concern of blood in brief, this involves an extensive number of treatment options, and is a complaint that carries with it a high risk of complications and morbidity.  The differential diagnosis includes chronic sacral wound, less likely vaginal bleeding, anemia.  MDM: With the patient having history of a hysterectomy, having a recent pelvic exam performed by OB/GYN, note reviewed by myself, I do not believe that this is likely vaginal bleeding.  It sounds as though there is just some blood or dark discharge which is seen in the brief when changing.  Patient has a stage III sacral decubitus ulcer.  Overall appears to be healing well.  Will obtain imaging of the patient's abdomen pelvis to ensure there is no additional tunneling.  Will check blood work on the patient.  Reassessment 8:15 PM-patient's CT imaging shows worsening sacral decubitus ulcer.  There are some gas  visualized, however it is an open wound, I do not believe this represents a necrotizing soft tissue infection.  Patient afebrile.  She does have a white count of 29.  Discussed her imaging with Dr. Polly from general surgery, they will round on the patient.  I started the patient on broad-spectrum antibiotics.  She will be admitted to the hospitalist service.   Additional history obtained: -Additional history obtained from EMS -External records from outside source obtained and reviewed including: Chart review including previous notes, labs, imaging, consultation notes   Lab Tests: -I ordered, reviewed, and interpreted labs.   The pertinent results include:   Labs Reviewed  COMPREHENSIVE METABOLIC PANEL WITH GFR - Abnormal; Notable for the following components:      Result Value   Sodium 134 (*)    Chloride 97 (*)    Glucose, Bld 188 (*)    Creatinine, Ser 3.36 (*)    Calcium  8.1 (*)    Albumin  2.1 (*)    Alkaline Phosphatase 161 (*)    GFR, Estimated 15 (*)    All other components within normal limits  CBC WITH DIFFERENTIAL/PLATELET - Abnormal; Notable for the following components:   WBC 29.6 (*)    Hemoglobin 10.4 (*)    HCT 34.3 (*)    MCV 77.8 (*)    MCH 23.6 (*)    RDW 21.2 (*)    Neutro Abs 27.2 (*)    Monocytes Absolute 1.2 (*)    All other components within normal limits  TYPE AND SCREEN       Imaging Studies ordered: I ordered imaging studies including CT abdomen pelvis I independently visualized and interpreted imaging. I agree with the radiologist interpretation   Medicines ordered and prescription drug management: Meds ordered this encounter  Medications   vancomycin  (VANCOREADY) IVPB 1250 mg/250 mL    Indication::   Wound Infection   DISCONTD: piperacillin -tazobactam (ZOSYN ) IVPB 3.375 g    Antibiotic Indication::   Wound Infection   piperacillin -tazobactam (ZOSYN ) IVPB 2.25 g    Antibiotic Indication::   Other Indication (list below)   docusate  sodium (COLACE) capsule 100 mg   polyethylene glycol (MIRALAX  / GLYCOLAX ) packet 17 g    -I have reviewed the patients home medicines and have made adjustments as needed  Cardiac Monitoring: The patient was maintained on a cardiac monitor.  I personally viewed and interpreted the cardiac monitored which showed an underlying rhythm of: Normal sinus rhythm  Social Determinants of Health:  Factors impacting patients care include: Multiple medical comorbidities including ESRD   Reevaluation: After the interventions noted above, I reevaluated the patient and found that they have :improved  Co morbidities that complicate the patient evaluation  Past Medical History:  Diagnosis Date   Colitis    Diabetes mellitus    ESRD (end stage renal disease) (HCC)    on HD (M,W,F)   Family history of adverse reaction to anesthesia     my uncle's heart stoped    Fracture of femoral neck, right (HCC) 10/02/2020   GERD (gastroesophageal reflux disease)    Heart murmur    Hypertension    Impaired vision in both eyes 11/19/2015   Mitral regurgitation    Neuropathy in diabetes (HCC)    Syncope and collapse 07/28/2015       Final Clinical Impression(s) / ED Diagnoses Final diagnoses:  Pressure injury of sacral region, stage 3 (HCC)     @PCDICTATION @    Mannie Pac T, DO 01/24/24 2019

## 2024-01-24 NOTE — Progress Notes (Signed)
 ED Pharmacy Antibiotic Sign Off An antibiotic consult was received from an ED provider for vancomycin  and zosyn  per pharmacy. A chart review was completed to assess appropriateness.  The following one time order(s) were placed per pharmacy consult:  zosyn  2.25g x 1 dose vancomycin  1250 mg x 1 dose  Further antibiotic and/or antibiotic pharmacy consults should be ordered by the admitting provider if indicated.   Thank you for allowing pharmacy to be a part of this patient's care.   Dorn Buttner, PharmD, BCPS 01/24/2024 6:42 PM ED Clinical Pharmacist -  [phone]

## 2024-01-24 NOTE — H&P (Signed)
 History and Physical    [name] [name] FMW:[id number] DOB: 07-09-1965 DOA: 01/24/2024  PCP: Geroge Charlie CROME., MD  Chief Complaint: Vaginal bleeding  HPI: [name] [name] is a 58 y.o. female with medical history significant of ESRD on HD TTS, anemia of chronic kidney disease, type 2 diabetes with peripheral neuropathy, left eye blindness, hypertension, GERD, lumbar radiculopathy. Hospital admission 5/19-12/06/2023 for infected diabetic foot ulcer with cellulitis/gangrene and gas-forming infection of the left heel/osteomyelitis status post left BKA. Hospital course complicated by multifocal ischemic stroke, depression/catatonia, and worsening anemia requiring blood transfusions.  Patient presents to the ED today complaining of vaginal bleeding for the past few months.  She has noticed dark discharge from her vagina but no bright red blood.  She she also reports a chronic sacral wound on her back.  Denies fevers or chills.  Denies chest pain or shortness of breath.  She reports chronic constipation and 1 episode of vomiting prior to coming into the ED.  States her last bowel movement was a few days ago but she is still passing gas.  She no longer feels nauseous and has been able to eat a full sandwich and drink fluids in the ED.  Denies abdominal pain.  She reports missing dialysis on Saturday but did go today.  She does not produce any urine.  Chart reviewed, patient with history of hysterectomy.  She was recently seen by OB/GYN on 7/14 and had a pelvic/internal speculum exam done which did not show active vaginal bleeding and it was felt that blood may be coming from her sacral decubitus ulcer.  She did have vaginal discharge and tested negative for BV, Candida, trichomonas, chlamydia, and gonorrhea.  ED Course: No vaginal bleeding noted on exam.  Noted to have large stage III sacral decubitus ulcer with some dark discharge.  Afebrile.  Not tachycardic or hypotensive.  Labs notable for WBC count 29.6,  hemoglobin 10.4 (close to baseline), sodium 134, potassium 4.5, chloride 97, glucose 188, bicarb 24, calcium  8.1, albumin  2.1, alk phos 161, transaminases and T. bili normal.  CT abdomen pelvis showing a large sacral decubitus ulcer with some gas extending into the musculature of the right gluteal region suggesting infection and osteomyelitis involving the underlying sacrum is not excluded.  CT also showing bilateral renal atrophy with mild bilateral hydroureteronephrosis but without obstructing calculus.  In addition, there is significant wall thickening involving the anterior and superior aspect of the urinary bladder which may represent cystitis but neoplasm is not excluded.  Large amount of stool is seen involving the distal portions of the colon.  Moderate wall thickening of sigmoid colon and rectum concerning for proctocolitis.  Patient was given vancomycin  and Zosyn .  General surgery consulted given need for surgical debridement.  Review of Systems:  Review of Systems  All other systems reviewed and are negative.   Past Medical History:  Diagnosis Date   Colitis    Diabetes mellitus    ESRD (end stage renal disease) (HCC)    on HD (M,W,F)   Family history of adverse reaction to anesthesia     my uncle's heart stoped    Fracture of femoral neck, right (HCC) 10/02/2020   GERD (gastroesophageal reflux disease)    Heart murmur    Hypertension    Impaired vision in both eyes 11/19/2015   Mitral regurgitation    Neuropathy in diabetes [facility])    Syncope and collapse 07/28/2015    Past Surgical History:  Procedure Laterality Date   ABDOMINAL  HYSTERECTOMY  1999   for fibroids. she thinks one ovary was left   AMPUTATION Left 11/25/2023   Procedure: AMPUTATION BELOW KNEE LEFT;  Surgeon: Harden Jerona GAILS, MD;  Location: [facility] OR;  Service: Orthopedics;  Laterality: Left;   CATARACT EXTRACTION     CESAREAN SECTION     CHOLECYSTECTOMY     EYE SURGERY Left retinal   GANGLION CYST EXCISION      HIP PINNING,CANNULATED Right 10/02/2020   Procedure: CANNULATED HIP PINNING;  Surgeon: Celena Sharper, MD;  Location: [facility] OR;  Service: Orthopedics;  Laterality: Right;   INCISION AND DRAINAGE ABSCESS N/A 04/10/2021   Procedure: INCISION AND DRAINAGE ABSCESS LABIA;  Surgeon: Curvin Deward MOULD, MD;  Location: [facility] OR;  Service: General;  Laterality: N/A;   IR FLUORO GUIDE CV LINE LEFT  10/13/2020   IR PTA VENOUS EXCEPT DIALYSIS CIRCUIT  10/13/2020   ORIF TIBIA PLATEAU Left 10/09/2020   Procedure: OPEN REDUCTION INTERNAL FIXATION (ORIF) TIBIAL PLATEAU;  Surgeon: Celena Sharper, MD;  Location: [facility] OR;  Service: Orthopedics;  Laterality: Left;   TONSILLECTOMY     TUBAL LIGATION       reports that she has never smoked. She has never used smokeless tobacco. She reports that she does not drink alcohol and does not use drugs.  Allergies  Allergen Reactions   Amitriptyline  Other (See Comments), Swelling and Nausea And Vomiting    Hypotension, Mental Status Changes, Confusion - Allergic, per MAR   Ciprofloxacin  Diarrhea, Nausea And Vomiting and Other (See Comments)    Severe vomiting requiring ED visit, IV reglan  and IV fluids- Allergic, per MAR   Ibuprofen  Itching   Aspirin  Itching and Other (See Comments)    Pt endorses getting itchy in the past with aspirin . Tolerated dose 04/06/22.   Na Ferric Gluc Cplx In Sucrose Other (See Comments)   Acetaminophen  Itching, Other (See Comments) and Nausea And Vomiting    Allergic, per MAR   Semaglutide Nausea Only and Other (See Comments)    Allergic, per MAR   Tramadol  Itching    Family History  Problem Relation Age of Onset   Diabetes Father    Heart attack Father    Aneurysm Mother     Prior to Admission medications   Medication Sig Start Date End Date Taking? Authorizing Provider  amLODipine  (NORVASC ) 5 MG tablet Take 1 tablet (5 mg total) by mouth every evening. 12/06/23  Yes Regalado, Belkys A, MD  ascorbic acid  (VITAMIN C ) 1000 MG tablet Take  1 tablet (1,000 mg total) by mouth daily. 12/06/23  Yes Regalado, Belkys A, MD  atorvastatin  (LIPITOR) 20 MG tablet Take 1 tablet (20 mg total) by mouth daily. 12/06/23  Yes Regalado, Belkys A, MD  calcitRIOL  (ROCALTROL ) 0.25 MCG capsule Take 1 capsule (0.25 mcg total) by mouth Every Tuesday,Thursday,and Saturday with dialysis. 12/06/23  Yes Regalado, Belkys A, MD  calcium  carbonate (TUMS) 500 MG chewable tablet Chew 1 tablet (200 mg of elemental calcium  total) by mouth 3 (three) times daily with meals. 12/06/23  Yes Regalado, Belkys A, MD  cloNIDine  (CATAPRES  - DOSED IN MG/24 HR) 0.3 mg/24hr patch Place 0.3 mg onto the skin every Wednesday.   Yes [provider]  ELIQUIS  5 MG TABS tablet Take 5 mg by mouth 2 (two) times daily. 11/21/23  Yes [provider]  ferrous sulfate 325 (65 FE) MG EC tablet Take 325 mg by mouth in the morning and at bedtime.   Yes [provider]  fluticasone OREN)  50 MCG/ACT nasal spray Place 2 sprays into both nostrils in the morning and at bedtime.   Yes [provider]  gabapentin  (NEURONTIN ) 100 MG capsule Take 1 capsule (100 mg total) by mouth 3 (three) times daily. For 1 week take 1 capsule by mouth at bedtime, following week twice daily, following week 3 times daily 01/04/24  Yes Zamora, Erin R, NP  hydrOXYzine  (ATARAX ) 10 MG tablet Take 10 mg by mouth 3 (three) times daily. 01/13/24  Yes [provider]  isosorbide  mononitrate (IMDUR ) 30 MG 24 hr tablet Take 30 mg by mouth See admin instructions. Take 30 mg by mouth at 8 AM on Sun/Mon/Wed/Fri 11/16/23  Yes [provider]  lidocaine  (LIDODERM ) 5 % Place 1 patch onto the skin See admin instructions. Apply 1 new patch to bilateral feet at 8 AM daily- Remove & Discard patch within 12 hours or as directed by MD   Yes [provider]  metoprolol  tartrate (LOPRESSOR ) 50 MG tablet Take 1 tablet (50 mg total) by mouth 2 (two) times daily. 12/06/23  Yes Regalado, Belkys A, MD   metroNIDAZOLE  (FLAGYL ) 500 MG tablet Take 500 mg by mouth daily. 12/29/23  Yes [provider]  multivitamin (RENA-VIT) TABS tablet Take 1 tablet by mouth daily at 8 pm.   Yes [provider]  nystatin cream (MYCOSTATIN) Apply 1 Application topically See admin instructions. Apply to sacrum 2 times a day after cleansing ab=nd drying. Apply collagen particles after nystatin cream. 11/16/23  Yes [provider]  omeprazole  (PRILOSEC) 40 MG capsule Take 40 mg by mouth in the morning and at bedtime.   Yes [provider]  ondansetron  (ZOFRAN ) 4 MG tablet Take 1 tablet (4 mg total) by mouth every 8 (eight) hours as needed for nausea or vomiting. 05/22/22  Yes Rai, Ripudeep K, MD  PROTEIN PO Take 30 mLs by mouth 2 (two) times daily.   Yes [provider]  thiamine  (VITAMIN B-1) 100 MG tablet Take 1 tablet (100 mg total) by mouth daily. 12/06/23  Yes Regalado, Belkys A, MD  zinc  sulfate, 50mg  elemental zinc , 220 (50 Zn) MG capsule Take 1 capsule (220 mg total) by mouth daily. 12/06/23  Yes Madelyne Owen LABOR, MD    Physical Exam: Vitals:   01/24/24 1900 01/24/24 1930 01/24/24 2000 01/24/24 2130  BP: (!) 129/57 (!) 117/51 (!) 176/62 134/62  Pulse:   61   Resp: (!) 8 10 13 13   Temp:   98.9 F (37.2 C)   TempSrc:   Oral   SpO2:   97%   Weight:      Height:        Physical Exam Vitals reviewed.  Constitutional:      General: She is not in acute distress. HENT:     Head: Normocephalic and atraumatic.  Eyes:     Extraocular Movements: Extraocular movements intact.  Cardiovascular:     Rate and Rhythm: Regular rhythm. Bradycardia present.     Pulses: Normal pulses.  Pulmonary:     Effort: Pulmonary effort is normal. No respiratory distress.     Breath sounds: Normal breath sounds. No wheezing or rales.  Abdominal:     General: Bowel sounds are normal. There is no distension.     Palpations: Abdomen is soft.     Tenderness: There is no abdominal  tenderness. There is no guarding.  Musculoskeletal:     Cervical back: Normal range of motion.     Right lower leg:  No edema.     Comments: Left BKA  Skin:    General: Skin is warm and dry.     Comments: Patient has refused examination of her sacral area at this time.  Neurological:     General: No focal deficit present.     Mental Status: She is alert and oriented to person, place, and time.     Labs on Admission: I have personally reviewed following labs and imaging studies  CBC: Recent Labs  Lab 01/24/24 1826  WBC 29.6*  NEUTROABS 27.2*  HGB 10.4*  HCT 34.3*  MCV 77.8*  PLT 318   Basic Metabolic Panel: Recent Labs  Lab 01/24/24 1826  NA 134*  K 4.5  CL 97*  CO2 24  GLUCOSE 188*  BUN 18  CREATININE 3.36*  CALCIUM  8.1*   GFR: Estimated Creatinine Clearance: 15 mL/min (A) (by C-G formula based on SCr of 3.36 mg/dL (H)). Liver Function Tests: Recent Labs  Lab 01/24/24 1826  AST 19  ALT 7  ALKPHOS 161*  BILITOT 1.0  PROT 7.4  ALBUMIN  2.1*   No results for input(s): LIPASE, AMYLASE in the last 168 hours. No results for input(s): AMMONIA in the last 168 hours. Coagulation Profile: No results for input(s): INR, PROTIME in the last 168 hours. Cardiac Enzymes: No results for input(s): CKTOTAL, CKMB, CKMBINDEX, TROPONINI in the last 168 hours. BNP (last 3 results) No results for input(s): PROBNP in the last 8760 hours. HbA1C: No results for input(s): HGBA1C in the last 72 hours. CBG: No results for input(s): GLUCAP in the last 168 hours. Lipid Profile: No results for input(s): CHOL, HDL, LDLCALC, TRIG, CHOLHDL, LDLDIRECT in the last 72 hours. Thyroid  Function Tests: No results for input(s): TSH, T4TOTAL, FREET4, T3FREE, THYROIDAB in the last 72 hours. Anemia Panel: No results for input(s): VITAMINB12, FOLATE, FERRITIN, TIBC, IRON , RETICCTPCT in the last 72 hours. Urine analysis:    Component  Value Date/Time   COLORURINE STRAW (A) 03/07/2020 1123   APPEARANCEUR TURBID (A) 03/07/2020 1123   LABSPEC 1.019 03/07/2020 1123   PHURINE 7.0 03/07/2020 1123   GLUCOSEU NEGATIVE 03/07/2020 1123   GLUCOSEU >=1000 (A) 11/19/2015 1505   HGBUR SMALL (A) 03/07/2020 1123   BILIRUBINUR NEGATIVE 03/07/2020 1123   BILIRUBINUR neg 03/13/2015 1858   KETONESUR NEGATIVE 03/07/2020 1123   PROTEINUR >=300 (A) 03/07/2020 1123   UROBILINOGEN 0.2 11/19/2015 1505   NITRITE NEGATIVE 03/07/2020 1123   LEUKOCYTESUR MODERATE (A) 03/07/2020 1123    Radiological Exams on Admission: CT ABDOMEN PELVIS WO CONTRAST Result Date: 01/24/2024 CLINICAL DATA:  Acute generalized abdominal pain. EXAM: CT ABDOMEN AND PELVIS WITHOUT CONTRAST TECHNIQUE: Multidetector CT imaging of the abdomen and pelvis was performed following the standard protocol without IV contrast. RADIATION DOSE REDUCTION: This exam was performed according to the departmental dose-optimization program which includes automated exposure control, adjustment of the mA and/or kV according to patient size and/or use of iterative reconstruction technique. COMPARISON:  Dec 01, 2022. FINDINGS: Lower chest: No acute abnormality. Hepatobiliary: No focal liver abnormality is seen. Status post cholecystectomy. No biliary dilatation. Pancreas: Unremarkable. No pancreatic ductal dilatation or surrounding inflammatory changes. Spleen: Normal in size without focal abnormality. Adrenals/Urinary Tract: Adrenal glands appear normal. Bilateral renal atrophy is noted. Mild bilateral hydroureteronephrosis is noted without obstructing calculus. There remains significant wall thickening of the anterior and superior portion of urinary bladder which may represent cystitis, but neoplasm cannot be excluded. Stomach/Bowel: The stomach is unremarkable. Large amount of stool is seen in the  transverse, descending and sigmoid colon. Moderate wall thickening of distal sigmoid colon and rectum is  noted suggesting proctocolitis. Vascular/Lymphatic: Aortic atherosclerosis. No enlarged abdominal or pelvic lymph nodes. Reproductive: Status post hysterectomy. No adnexal masses. Other: There appears to be interval development large sacral decubitus ulceration on the left side. Some gas is seen in the musculature of the gluteal region on the right suggesting infection. Musculoskeletal: Some degree of osteomyelitis involving the sacrum due to adjacent decubitus ulceration cannot be excluded. IMPRESSION: Interval development of large sacral decubitus ulceration is seen in the left side, with some gas extending into the musculature of the right gluteal region suggesting infection. Some degree of osteomyelitis involving the underlying sacrum cannot be excluded. Bilateral renal atrophy is noted. Mild bilateral hydroureteronephrosis is noted without obstructing calculus. There remains significant wall thickening involving the anterior and superior aspect of the urinary bladder which may represent cystitis, but neoplasm cannot be excluded. Large amount of stool is seen involving the distal portions of the colon. Moderate wall thickening of sigmoid colon and rectum is noted concerning for proctocolitis. Aortic Atherosclerosis (ICD10-I70.0). Electronically Signed   By: Lynwood Landy Raddle M.D.   On: 01/24/2024 17:49    Assessment and Plan  Infected large sacral decubitus ulcer with possible sacral osteomyelitis Does have significant leukocytosis on labs but no fever or signs of sepsis.  Surgical debridement may be needed and general surgery has been consulted.  Patient has refused examination of her sacrum at this time as she experiences pain every time she turns in the bed.  She was evaluated by general surgery earlier in the ED, will follow-up recommendations.  Keep n.p.o. after midnight unless if diet okay per general surgery.  Holding Eliquis  at this time pending general surgery recommendations.  Continue vancomycin   and Zosyn .  Trend WBC count.  Proctocolitis Continue antibiotics.  Constipation MiraLAX  daily, Colace PRN.  ESRD on HD TTS Last dialysis was today.  No signs of volume overload.  Potassium and bicarb within normal range.  No indication for urgent dialysis overnight.  Anemia of chronic kidney disease Hemoglobin close to baseline, monitor labs.  Type 2 diabetes with peripheral neuropathy Last A1c 7.1 on 11/22/2023.  Placed on very sensitive sliding scale insulin .  Continue gabapentin .  Hypertension Currently normotensive.  Continue amlodipine , clonidine , and Imdur .  Holding metoprolol  at this time due to mild bradycardia/heart rate in the 50s.  History of CVA Holding Eliquis  pending general surgery recommendations.  Continue Lipitor.  Bladder wall thickening CT showing significant wall thickening involving the anterior and superior aspect of the urinary bladder which may represent cystitis but neoplasm is not excluded.  UA cannot be sent off as patient does not produce urine.  Already on antibiotics.  Will need outpatient follow-up for further evaluation.  DVT prophylaxis: Holding Eliquis  pending general surgery recommendations. Code Status: Full Code (discussed with the patient) Family Communication: No family available at this time. Consults called: General Surgery Level of care: Telemetry bed Admission status: It is my clinical opinion that admission to INPATIENT is reasonable and necessary because of the expectation that this patient will require hospital care that crosses at least 2 midnights to treat this condition based on the medical complexity of the problems presented.  Given the aforementioned information, the predictability of an adverse outcome is felt to be significant.  Editha Ram MD [facility]  If 7PM-7AM, please contact night-coverage [email or web address]  01/24/2024, 10:41 PM

## 2024-01-25 ENCOUNTER — Encounter (HOSPITAL_COMMUNITY): Payer: Self-pay | Admitting: Internal Medicine

## 2024-01-25 DIAGNOSIS — M4628 Osteomyelitis of vertebra, sacral and sacrococcygeal region: Secondary | ICD-10-CM | POA: Diagnosis not present

## 2024-01-25 LAB — BASIC METABOLIC PANEL WITH GFR
Anion gap: 14 (ref 5–15)
BUN: 24 mg/dL — ABNORMAL HIGH (ref 6–20)
CO2: 23 mmol/L (ref 22–32)
Calcium: 7.8 mg/dL — ABNORMAL LOW (ref 8.9–10.3)
Chloride: 100 mmol/L (ref 98–111)
Creatinine, Ser: 3.81 mg/dL — ABNORMAL HIGH (ref 0.44–1.00)
GFR, Estimated: 13 mL/min — ABNORMAL LOW (ref >60)
Glucose, Bld: 165 mg/dL — ABNORMAL HIGH (ref 70–99)
Potassium: 4.2 mmol/L (ref 3.5–5.1)
Sodium: 137 mmol/L (ref 135–145)

## 2024-01-25 LAB — HIV ANTIBODY (ROUTINE TESTING W REFLEX): HIV Screen 4th Generation wRfx: NONREACTIVE

## 2024-01-25 LAB — CBC
HCT: 29.7 % — ABNORMAL LOW (ref 36.0–46.0)
Hemoglobin: 9.1 g/dL — ABNORMAL LOW (ref 12.0–15.0)
MCH: 23.5 pg — ABNORMAL LOW (ref 26.0–34.0)
MCHC: 30.6 g/dL (ref 30.0–36.0)
MCV: 76.5 fL — ABNORMAL LOW (ref 80.0–100.0)
Platelets: 302 K/uL (ref 150–400)
RBC: 3.88 MIL/uL (ref 3.87–5.11)
RDW: 20.8 % — ABNORMAL HIGH (ref 11.5–15.5)
WBC: 23.7 K/uL — ABNORMAL HIGH (ref 4.0–10.5)
nRBC: 0.1 % (ref 0.0–0.2)

## 2024-01-25 LAB — HEPATITIS B SURFACE ANTIGEN: Hepatitis B Surface Ag: NONREACTIVE

## 2024-01-25 LAB — CBG MONITORING, ED: Glucose-Capillary: 203 mg/dL — ABNORMAL HIGH (ref 70–99)

## 2024-01-25 MED ORDER — INSULIN ASPART 100 UNIT/ML IJ SOLN
0.0000 [IU] | Freq: Every day | INTRAMUSCULAR | Status: DC
  Administered 2024-01-29: 3 [IU] via SUBCUTANEOUS

## 2024-01-25 MED ORDER — OXYCODONE HCL 5 MG PO TABS
5.0000 mg | ORAL_TABLET | ORAL | Status: DC | PRN
  Administered 2024-01-25 – 2024-02-03 (×7): 5 mg via ORAL
  Filled 2024-01-25 (×7): qty 1

## 2024-01-25 MED ORDER — LIDOCAINE 5 % EX PTCH: 1.0000 | MEDICATED_PATCH | CUTANEOUS | Status: DC

## 2024-01-25 MED ORDER — INSULIN ASPART 100 UNIT/ML IJ SOLN
0.0000 [IU] | Freq: Three times a day (TID) | INTRAMUSCULAR | Status: DC
  Administered 2024-01-27 – 2024-01-28 (×2): 1 [IU] via SUBCUTANEOUS
  Administered 2024-01-29 (×2): 2 [IU] via SUBCUTANEOUS

## 2024-01-25 MED ORDER — CHLORHEXIDINE GLUCONATE CLOTH 2 % EX PADS
6.0000 | MEDICATED_PAD | Freq: Every day | CUTANEOUS | Status: DC
  Administered 2024-01-26 – 2024-01-30 (×5): 6 via TOPICAL

## 2024-01-25 MED ORDER — MORPHINE SULFATE (PF) 2 MG/ML IV SOLN
1.0000 mg | INTRAVENOUS | Status: DC | PRN
  Administered 2024-01-25: 1 mg via INTRAVENOUS
  Filled 2024-01-25: qty 1

## 2024-01-25 MED ORDER — APIXABAN 5 MG PO TABS
5.0000 mg | ORAL_TABLET | Freq: Two times a day (BID) | ORAL | Status: DC
  Administered 2024-01-25 – 2024-02-03 (×18): 5 mg via ORAL
  Filled 2024-01-25 (×10): qty 1
  Filled 2024-01-25: qty 2
  Filled 2024-01-25: qty 1
  Filled 2024-01-25: qty 2
  Filled 2024-01-25 (×8): qty 1

## 2024-01-25 NOTE — Consult Note (Signed)
 Renal Service Consult Note [facility]  [name] [name] 01/25/2024 [name] JONETTA Fret, MD Requesting Physician: Dr. Tobie, SHAUNNA.   Reason for Consult: ESRD pt w/ sacral osteomyelitis HPI: The patient is a 58 y.o. year-old w/ PMH as below who presented to ED yesterday c/o vaginal bleeding and a chronic sacral wound on her back. In ED pt was afeb, HR and RR wnl. wBC 29K, Hb 10, K+ 4.5. CT abd showed large sacral decub w/ gas extending into the musculature of the right gluteal region suggesting infection and osteomyelitis involving the underlying sacrum was not excluded; also possible cystitis. Pt rec'd IV zosyn  and vanc, gen surg consulted and pt admitted. We are asked to see for dialysis.    Pt seen in room, pt is lethargic, confused. Poor historian.    ROS - n/a   Past Medical History  Past Medical History:  Diagnosis Date   Colitis    Diabetes mellitus    ESRD (end stage renal disease) (HCC)    on HD (M,W,F)   Family history of adverse reaction to anesthesia     my uncle's heart stoped    Fracture of femoral neck, right (HCC) 10/02/2020   GERD (gastroesophageal reflux disease)    Heart murmur    Hypertension    Impaired vision in both eyes 11/19/2015   Mitral regurgitation    Neuropathy in diabetes (HCC)    Syncope and collapse 07/28/2015   Past Surgical History  Past Surgical History:  Procedure Laterality Date   ABDOMINAL HYSTERECTOMY  1999   for fibroids. she thinks one ovary was left   AMPUTATION Left 11/25/2023   Procedure: AMPUTATION BELOW KNEE LEFT;  Surgeon: Harden Jerona GAILS, MD;  Location: [facility] OR;  Service: Orthopedics;  Laterality: Left;   CATARACT EXTRACTION     CESAREAN SECTION     CHOLECYSTECTOMY     EYE SURGERY Left retinal   GANGLION CYST EXCISION     HIP PINNING,CANNULATED Right 10/02/2020   Procedure: CANNULATED HIP PINNING;  Surgeon: Celena Sharper, MD;  Location: [facility] OR;  Service: Orthopedics;  Laterality: Right;   INCISION AND DRAINAGE  ABSCESS N/A 04/10/2021   Procedure: INCISION AND DRAINAGE ABSCESS LABIA;  Surgeon: Curvin Deward MOULD, MD;  Location: [facility] OR;  Service: General;  Laterality: N/A;   IR FLUORO GUIDE CV LINE LEFT  10/13/2020   IR PTA VENOUS EXCEPT DIALYSIS CIRCUIT  10/13/2020   ORIF TIBIA PLATEAU Left 10/09/2020   Procedure: OPEN REDUCTION INTERNAL FIXATION (ORIF) TIBIAL PLATEAU;  Surgeon: Celena Sharper, MD;  Location: [facility] OR;  Service: Orthopedics;  Laterality: Left;   TONSILLECTOMY     TUBAL LIGATION     Family History  Family History  Problem Relation Age of Onset   Diabetes Father    Heart attack Father    Aneurysm Mother    Social History  reports that she has never smoked. She has never used smokeless tobacco. She reports that she does not drink alcohol and does not use drugs. Allergies  Allergies  Allergen Reactions   Amitriptyline  Other (See Comments), Swelling and Nausea And Vomiting    Hypotension, Mental Status Changes, Confusion - Allergic, per MAR   Ciprofloxacin  Diarrhea, Nausea And Vomiting and Other (See Comments)    Severe vomiting requiring ED visit, IV reglan  and IV fluids- Allergic, per MAR   Ibuprofen  Itching   Aspirin  Itching and Other (See Comments)    Pt endorses getting itchy in the past with aspirin . Tolerated dose 04/06/22.  Na Ferric Gluc Cplx In Sucrose Other (See Comments)   Acetaminophen  Itching, Other (See Comments) and Nausea And Vomiting    Allergic, per MAR   Semaglutide Nausea Only and Other (See Comments)    Allergic, per MAR   Tramadol  Itching   Home medications Prior to Admission medications   Medication Sig Start Date End Date Taking? Authorizing Provider  amLODipine  (NORVASC ) 5 MG tablet Take 1 tablet (5 mg total) by mouth every evening. 12/06/23  Yes Regalado, Belkys A, MD  ascorbic acid  (VITAMIN C ) 1000 MG tablet Take 1 tablet (1,000 mg total) by mouth daily. 12/06/23  Yes Regalado, Belkys A, MD  atorvastatin  (LIPITOR) 20 MG tablet Take 1 tablet (20 mg  total) by mouth daily. 12/06/23  Yes Regalado, Belkys A, MD  calcitRIOL  (ROCALTROL ) 0.25 MCG capsule Take 1 capsule (0.25 mcg total) by mouth Every Tuesday,Thursday,and Saturday with dialysis. 12/06/23  Yes Regalado, Belkys A, MD  calcium  carbonate (TUMS) 500 MG chewable tablet Chew 1 tablet (200 mg of elemental calcium  total) by mouth 3 (three) times daily with meals. 12/06/23  Yes Regalado, Belkys A, MD  cloNIDine  (CATAPRES  - DOSED IN MG/24 HR) 0.3 mg/24hr patch Place 0.3 mg onto the skin every Wednesday.   Yes [provider]  ELIQUIS  5 MG TABS tablet Take 5 mg by mouth 2 (two) times daily. 11/21/23  Yes [provider]  ferrous sulfate 325 (65 FE) MG EC tablet Take 325 mg by mouth in the morning and at bedtime.   Yes [provider]  fluticasone (FLONASE) 50 MCG/ACT nasal spray Place 2 sprays into both nostrils in the morning and at bedtime.   Yes [provider]  gabapentin  (NEURONTIN ) 100 MG capsule Take 1 capsule (100 mg total) by mouth 3 (three) times daily. For 1 week take 1 capsule by mouth at bedtime, following week twice daily, following week 3 times daily 01/04/24  Yes Valdemar Rocky SAUNDERS, NP  hydrOXYzine  (ATARAX ) 10 MG tablet Take 10 mg by mouth 3 (three) times daily. 01/13/24  Yes [provider]  isosorbide  mononitrate (IMDUR ) 30 MG 24 hr tablet Take 30 mg by mouth See admin instructions. Take 30 mg by mouth at 8 AM on Sun/Mon/Wed/Fri 11/16/23  Yes [provider]  lidocaine  (LIDODERM ) 5 % Place 1 patch onto the skin See admin instructions. Apply 1 new patch to bilateral feet at 8 AM daily- Remove & Discard patch within 12 hours or as directed by MD   Yes [provider]  metoprolol  tartrate (LOPRESSOR ) 50 MG tablet Take 1 tablet (50 mg total) by mouth 2 (two) times daily. 12/06/23  Yes Regalado, Belkys A, MD  metroNIDAZOLE  (FLAGYL ) 500 MG tablet Take 500 mg by mouth daily. 12/29/23  Yes [provider]  multivitamin (RENA-VIT) TABS  tablet Take 1 tablet by mouth daily at 8 pm.   Yes [provider]  nystatin cream (MYCOSTATIN) Apply 1 Application topically See admin instructions. Apply to sacrum 2 times a day after cleansing ab=nd drying. Apply collagen particles after nystatin cream. 11/16/23  Yes [provider]  omeprazole  (PRILOSEC) 40 MG capsule Take 40 mg by mouth in the morning and at bedtime.   Yes [provider]  ondansetron  (ZOFRAN ) 4 MG tablet Take 1 tablet (4 mg total) by mouth every 8 (eight) hours as needed for nausea or vomiting. 05/22/22  Yes Rai, Ripudeep K, MD  PROTEIN PO Take 30 mLs by mouth 2 (two) times daily.   Yes [provider]  thiamine  (VITAMIN B-1) 100 MG tablet Take 1 tablet (100 mg total) by mouth daily. 12/06/23  Yes Regalado, Belkys A, MD  zinc  sulfate, 50mg  elemental zinc , 220 (50 Zn) MG capsule Take 1 capsule (220 mg total) by mouth daily. 12/06/23  Yes Regalado, Belkys A, MD     Vitals:   01/25/24 1100 01/25/24 1104 01/25/24 1239 01/25/24 1534  BP: (!) 115/56 (!) 115/56 (!) 134/55 123/62  Pulse:  60 (!) 58 (!) 59  Resp:  12 16 16   Temp:  98.9 F (37.2 C) 98.7 F (37.1 C) 98.2 F (36.8 C)  TempSrc:  Oral Oral Oral  SpO2:  100% 99% 99%  Weight:      Height:       Exam Gen lethargic, no distress, chronically ill and frail in appearance No rash, cyanosis or gangrene Sclera anicteric, throat clear  No jvd or bruits Chest clear bilat to bases, no rales/ wheezing RRR no MRG Abd soft ntnd no mass or ascites +bs GU deferred MS L BKA Ext 1-2+ RLE pretib edema Neuro as above    RIJ TDC   Home bp meds: Mprvasc 5 every day Catapres  patch 0.3 weekly Metoprolol  50 bid   OP HD: [facility] TTS 3h   B350   60.3 kg   TDC   Heparin  2000 Last HD yest, came off 58.5kg Missed HD 7/19, o/w good compliance    Assessment/ Plan: Infected decub ulcer/ possible sacro osteomyelitis: IV abx per pmd ESRD: on HD TTS. Last HD yest. HD tomorrow.  HTN: cont home  BP meds as needed Volume: some RLE edema 2+, no resp issues. Get vol down, lower dry wt.  Anemia of esrd: Hb 9-11 here, follow.        Myer Fret  MD [facility] 01/25/2024, 4:22 PM  Recent Labs  Lab 01/24/24 1826 01/25/24 0551  HGB 10.4* 9.1*  ALBUMIN  2.1*  --   CALCIUM  8.1* 7.8*  CREATININE 3.36* 3.81*  K 4.5 4.2   Inpatient medications:  apixaban   5 mg Oral BID   atorvastatin   20 mg Oral Daily   [START ON 01/26/2024] calcitRIOL   0.25 mcg Oral Q T,Th,Sa-HD   cloNIDine   0.3 mg Transdermal Q Wed   docusate sodium   100 mg Oral Once   gabapentin   100 mg Oral TID   hydrOXYzine   10 mg Oral TID   insulin  aspart  0-5 Units Subcutaneous QHS   insulin  aspart  0-6 Units Subcutaneous TID WC   isosorbide  mononitrate  30 mg Oral Once per day on Sunday Monday Wednesday Friday   lidocaine   1 patch Transdermal See admin instructions   multivitamin  1 tablet Oral Q2000   pantoprazole   40 mg Oral Daily   polyethylene glycol  17 g Oral Daily    piperacillin -tazobactam (ZOSYN )  IV Stopped (01/25/24 9367)   [START ON 01/26/2024] vancomycin      docusate sodium , oxyCODONE 

## 2024-01-25 NOTE — Hospital Course (Addendum)
 Patient with PMH of ESRD on HD TTS, anemia of CKD, type II DM, neuropathy, left eye blindness, HTN, GERD, lumbar radiculopathy, left BKA, CVA presented to the hospital with complaints of vaginal bleeding. Found to have sacral osteomyelitis with concern for bleeding coming from that site. Currently receiving antibiotics. Assessment and Plan: Sacral osteomyelitis. CT scan of the abdomen and pelvis shows evidence of sacral osteomyelitis, some gas. General surgery was also consulted. Recommends no intervention or indication for I&D. For now we will continue with IV antibiotic. Unfortunately no blood cultures performed at the time of the admission.  Repeat culture performed on 7/24.  Will monitor.  So far no growth. Pending the results we will consult ID.  Proctocolitis. Severe constipation. Continue current antibiotic Now having multiple diarrhea therefore we will be holding bowel regimen. Monitor  Vaginal bleeding. Patient reports that she actually has been having vaginal bleeding ongoing for last few months. Was actually seen by GYN outpatient for the same.  Per OB/GYN the source of bleeding is less likely vaginal in nature and more likely coming from her sacral ulcer. No further workup was recommended outpatient. Was evaluated for infection as well as given antibiotic. Continue Eliquis  for now. So far no bleeding reported. H&H relatively stable as well.  Possible cystitis. Receiving IV antibiotics Outpatient follow-up.  ESRD on HD. TTS schedule. Appreciate nephrology consultation.  Type II DM with peripheral neuropathy.  With long-term insulin  use. Last hemoglobin A1c 7.1. Refusing CBG. Continue sliding scale insulin . Continue gabapentin .  Anemia of CKD as well as chronic disease in the setting of osteomyelitis. H&H relatively stable.  For now we will monitor. Also reports that she has vaginal bleeding which most likely bleeding from the sacral ulcer. For now we will  continue to monitor H&H.  Intractable nausea and vomiting. X-ray unremarkable other than constipation. Monitor.

## 2024-01-25 NOTE — Progress Notes (Addendum)
 Pt arrived from [facility] in bed. Transfer to another bed, measure sacrum wound with [facility] Pt is alert and oriented, blind in left eye. ED RN is going to send pt phone in tube station. Pt receive call button and room phone with a cup of ice. Pt has red and clear discharge from her peri area. Pt is clean and new pad place. Remove brief.

## 2024-01-25 NOTE — Progress Notes (Signed)
 [facility] Progress Note Patient: [name] [name] FMW:[id number] DOB: Jun 12, 1966 DOA: 01/24/2024  DOS: the patient was seen and examined on 01/25/2024  Brief Hospital Course: Patient with PMH of ESRD on HD TTS, anemia of CKD, type II DM, neuropathy, left eye blindness, HTN, GERD, lumbar radiculopathy, left BKA, CVA presented to the hospital with complaints of vaginal bleeding. Found to have sacral osteomyelitis with concern for bleeding coming from that site. Currently receiving antibiotics. Assessment and Plan: Sacral osteomyelitis. CT scan of the abdomen and pelvis shows evidence of sacral osteomyelitis, some gas. General surgery was also consulted. Recommends no intervention or indication for I&D. For now we will continue with IV antibiotic. Follow-up on blood cultures.  Will require ID consultation for antibiotic duration and further workup.  Proctocolitis. Severe constipation. Continue current antibiotic as well as bowel regimen.  Vaginal bleeding. Patient reports that she actually has been having vaginal bleeding ongoing for last few months. Was actually seen by GYN outpatient for the same.  Per OB/GYN the source of bleeding is less likely vaginal in nature and more likely coming from her sacral ulcer. No further workup was recommended outpatient. Was evaluated for infection as well as given antibiotic. Continue Eliquis  for now.  Monitor for bleeding here in the hospital.  Possible cystitis. Receiving IV antibiotics Outpatient follow-up.  ESRD on HD. TTS schedule. Appreciate nephrology consultation.  Type II DM with peripheral neuropathy. Last hemoglobin A1c 7.1. Refusing CBG. Continue sliding scale insulin . Continue gabapentin .  Anemia of CKD as well as chronic disease in the setting of osteomyelitis. H&H relatively stable.  For now we will monitor. Also reports that she has vaginal bleeding which most likely bleeding from the sacral ulcer. For now we will  continue to monitor H&H.   Subjective: No nausea no vomiting.  Reports pain uncontrolled.  No fever no chills.  Physical Exam: Clear to auscultation. S1-S2 present no Bowel sounds present. No edema. Unable to examine the sacral area for now. Left BKA wrapped. Speech clear.  Alert awake and oriented to time place and person.  Data Reviewed: I have Reviewed nursing notes, Vitals, and Lab results. Since last encounter, pertinent lab results CBC and BMP   . I have ordered test including CBC and BMP  .  Discussed with nephrology  Disposition: Status is: Inpatient Remains inpatient appropriate because: Continue with IV antibiotics follow-up with culture.  apixaban  (ELIQUIS ) tablet 5 mg   Family Communication: No one at bedside Level of care: Med-Surg switching from telemetry to MedSurg. Vitals:   01/25/24 1100 01/25/24 1104 01/25/24 1239 01/25/24 1534  BP: (!) 115/56 (!) 115/56 (!) 134/55 123/62  Pulse:  60 (!) 58 (!) 59  Resp:  12 16 16   Temp:  98.9 F (37.2 C) 98.7 F (37.1 C) 98.2 F (36.8 C)  TempSrc:  Oral Oral Oral  SpO2:  100% 99% 99%  Weight:      Height:         Author: Yetta Blanch, MD 01/25/2024 5:51 PM  Please look on [email or web address] to find out who is on call.

## 2024-01-25 NOTE — ED Notes (Signed)
 Patient moved onto hospital bed.

## 2024-01-25 NOTE — Progress Notes (Signed)
 Pt is refusing blood sugars even after education. Pt stated she does not take insulin  at home. MD is aware.

## 2024-01-25 NOTE — ED Notes (Signed)
 [name] from [place] calling for patient update. Nurse updated on patient's status.

## 2024-01-25 NOTE — Plan of Care (Signed)
  Problem: Fluid Volume: Goal: Ability to maintain a balanced intake and output will improve Outcome: Progressing   Problem: Nutritional: Goal: Maintenance of adequate nutrition will improve Outcome: Progressing   Problem: Activity: Goal: Risk for activity intolerance will decrease Outcome: Not Progressing   Problem: Elimination: Goal: Will not experience complications related to urinary retention Outcome: Progressing   Problem: Pain Managment: Goal: General experience of comfort will improve and/or be controlled Outcome: Progressing

## 2024-01-25 NOTE — ED Notes (Signed)
 Patient refusing to have CBG checked and refusing insulin  at this time. Patient educated that having her blood sugar in range with help with wound healing. Patient verbalized understanding and still refused. MD aware.

## 2024-01-25 NOTE — ED Notes (Signed)
 Patient brief changed, dark red/brown discharge noted on assessment. Patient cleaned and repositioned on L side. Pillows under hip and between knees, foot free from pressure. Call bell within reach. Denies further needs at this time.

## 2024-01-26 DIAGNOSIS — M4628 Osteomyelitis of vertebra, sacral and sacrococcygeal region: Secondary | ICD-10-CM | POA: Diagnosis not present

## 2024-01-26 LAB — CBC WITH DIFFERENTIAL/PLATELET
Abs Immature Granulocytes: 0.1 K/uL — ABNORMAL HIGH (ref 0.00–0.07)
Basophils Absolute: 0.1 K/uL (ref 0.0–0.1)
Basophils Relative: 0 %
Eosinophils Absolute: 0.4 K/uL (ref 0.0–0.5)
Eosinophils Relative: 2 %
HCT: 30.3 % — ABNORMAL LOW (ref 36.0–46.0)
Hemoglobin: 9.2 g/dL — ABNORMAL LOW (ref 12.0–15.0)
Immature Granulocytes: 1 %
Lymphocytes Relative: 20 %
Lymphs Abs: 3.9 K/uL (ref 0.7–4.0)
MCH: 23.2 pg — ABNORMAL LOW (ref 26.0–34.0)
MCHC: 30.4 g/dL (ref 30.0–36.0)
MCV: 76.3 fL — ABNORMAL LOW (ref 80.0–100.0)
Monocytes Absolute: 1.6 K/uL — ABNORMAL HIGH (ref 0.1–1.0)
Monocytes Relative: 8 %
Neutro Abs: 13.5 K/uL — ABNORMAL HIGH (ref 1.7–7.7)
Neutrophils Relative %: 69 %
Platelets: 364 K/uL (ref 150–400)
RBC: 3.97 MIL/uL (ref 3.87–5.11)
RDW: 20.9 % — ABNORMAL HIGH (ref 11.5–15.5)
WBC: 19.4 K/uL — ABNORMAL HIGH (ref 4.0–10.5)
nRBC: 0 % (ref 0.0–0.2)

## 2024-01-26 LAB — COMPREHENSIVE METABOLIC PANEL WITH GFR
ALT: 7 U/L (ref 0–44)
AST: 13 U/L — ABNORMAL LOW (ref 15–41)
Albumin: 1.8 g/dL — ABNORMAL LOW (ref 3.5–5.0)
Alkaline Phosphatase: 122 U/L (ref 38–126)
Anion gap: 13 (ref 5–15)
BUN: 30 mg/dL — ABNORMAL HIGH (ref 6–20)
CO2: 23 mmol/L (ref 22–32)
Calcium: 7.9 mg/dL — ABNORMAL LOW (ref 8.9–10.3)
Chloride: 98 mmol/L (ref 98–111)
Creatinine, Ser: 4.51 mg/dL — ABNORMAL HIGH (ref 0.44–1.00)
GFR, Estimated: 11 mL/min — ABNORMAL LOW (ref >60)
Glucose, Bld: 223 mg/dL — ABNORMAL HIGH (ref 70–99)
Potassium: 4.7 mmol/L (ref 3.5–5.1)
Sodium: 134 mmol/L — ABNORMAL LOW (ref 135–145)
Total Bilirubin: 0.9 mg/dL (ref 0.0–1.2)
Total Protein: 6.5 g/dL (ref 6.5–8.1)

## 2024-01-26 LAB — GLUCOSE, CAPILLARY
Glucose-Capillary: 162 mg/dL — ABNORMAL HIGH (ref 70–99)
Glucose-Capillary: 157 mg/dL — ABNORMAL HIGH (ref 70–99)

## 2024-01-26 LAB — MAGNESIUM: Magnesium: 1.7 mg/dL (ref 1.7–2.4)

## 2024-01-26 LAB — C-REACTIVE PROTEIN: CRP: 19.2 mg/dL — ABNORMAL HIGH (ref <1.0)

## 2024-01-26 MED ORDER — COLLAGENASE 250 UNIT/GM EX OINT
TOPICAL_OINTMENT | Freq: Every day | CUTANEOUS | Status: DC
  Filled 2024-01-26 (×2): qty 30

## 2024-01-26 MED ORDER — HEPARIN SODIUM (PORCINE) 1000 UNIT/ML IJ SOLN
INTRAMUSCULAR | Status: AC
  Filled 2024-01-26: qty 4

## 2024-01-26 MED ORDER — OXYCODONE HCL 5 MG PO TABS
ORAL_TABLET | ORAL | Status: AC
  Filled 2024-01-26: qty 1

## 2024-01-26 MED ORDER — HEPARIN SODIUM (PORCINE) 1000 UNIT/ML IJ SOLN
INTRAMUSCULAR | Status: AC
  Filled 2024-01-26: qty 2

## 2024-01-26 NOTE — Plan of Care (Signed)
   Problem: Safety: Goal: Ability to remain free from injury will improve Outcome: Progressing

## 2024-01-26 NOTE — Progress Notes (Signed)
 Patient refuses to take insulin  and CBG monitoring. Patient is educated about the decision and Informed Lavanda Horns, NP about the patient's decision.

## 2024-01-26 NOTE — Plan of Care (Signed)
  Problem: Coping: Goal: Ability to adjust to condition or change in health will improve Outcome: Not Progressing   Problem: Nutritional: Goal: Maintenance of adequate nutrition will improve Outcome: Progressing   Problem: Activity: Goal: Risk for activity intolerance will decrease Outcome: Not Progressing   Problem: Elimination: Goal: Will not experience complications related to urinary retention Outcome: Not Applicable

## 2024-01-26 NOTE — Progress Notes (Signed)
 [facility] Progress Note Patient: [name] [name] FMW:[id number] DOB: 1965-09-26 DOA: 01/24/2024  DOS: the patient was seen and examined on 01/26/2024  Brief Hospital Course: Patient with PMH of ESRD on HD TTS, anemia of CKD, type II DM, neuropathy, left eye blindness, HTN, GERD, lumbar radiculopathy, left BKA, CVA presented to the hospital with complaints of vaginal bleeding. Found to have sacral osteomyelitis with concern for bleeding coming from that site. Currently receiving antibiotics. Assessment and Plan: Sacral osteomyelitis. CT scan of the abdomen and pelvis shows evidence of sacral osteomyelitis, some gas. General surgery was also consulted. Recommends no intervention or indication for I&D. For now we will continue with IV antibiotic. Will consult ID.  Unfortunately no blood cultures performed at the time of the admission.   Proctocolitis. Severe constipation. Continue current antibiotic as well as bowel regimen. Soapsuds enema.  Vaginal bleeding. Patient reports that she actually has been having vaginal bleeding ongoing for last few months. Was actually seen by GYN outpatient for the same.  Per OB/GYN the source of bleeding is less likely vaginal in nature and more likely coming from her sacral ulcer. No further workup was recommended outpatient. Was evaluated for infection as well as given antibiotic. Continue Eliquis  for now. So far no bleeding reported. H&H relatively stable as well.  Possible cystitis. Receiving IV antibiotics Outpatient follow-up.  ESRD on HD. TTS schedule. Appreciate nephrology consultation.  Type II DM with peripheral neuropathy.  With long-term insulin  use. Last hemoglobin A1c 7.1. Refusing CBG. Continue sliding scale insulin . Continue gabapentin .  Anemia of CKD as well as chronic disease in the setting of osteomyelitis. H&H relatively stable.  For now we will monitor. Also reports that she has vaginal bleeding which most likely  bleeding from the sacral ulcer. For now we will continue to monitor H&H.   Subjective: No nausea no vomiting.  Does not want to use any protein shakes.  No fever no chills.  Passing gas but no BM.  Pain improving.  Physical Exam: Clear to auscultation. S1-S2 present. Aortic systolic murmur. Bowel sound present. No edema.  Data Reviewed: I have Reviewed nursing notes, Vitals, and Lab results. Since last encounter, pertinent lab results CBC and BMP   . I have ordered test including CBC and BMP  .   Disposition: Status is: Inpatient Remains inpatient appropriate because: Continue with IV antibiotic.  apixaban  (ELIQUIS ) tablet 5 mg   Family Communication: No one at bedside Level of care: Med-Surg   Vitals:   01/26/24 1639 01/26/24 1654 01/26/24 1720 01/26/24 1730  BP: (!) 114/59 113/72 137/77 (!) 147/54  Pulse: 70 70 79 77  Resp: (!) 8 (!) 9 15 12   Temp:   98.7 F (37.1 C) 98.7 F (37.1 C)  TempSrc:      SpO2: 100% 100% 100% 100%  Weight:    58.8 kg  Height:         Author: Yetta Blanch, MD 01/26/2024 5:52 PM  Please look on [email or web address] to find out who is on call.

## 2024-01-26 NOTE — Progress Notes (Signed)
 Upon taking off stump shrinker off, noted pressure injury POA 4x2 with 90% slough covering. Luane Malling, LPN dressed with wet to dry and covered with foam. Ordered 1 Prevalon boot for RLE, air loss mattress and aquacel for sacral PI as per the WOC nurse recommendations.

## 2024-01-26 NOTE — Consult Note (Signed)
 WOC Nurse wound follow up Completed remotely with review of medical record and imaging Wound type: Left BKA surgical site Measurement: see nursing flow sheets Wound bed: 50% red, moist 50% yellow slough Drainage (amount, consistency, odor)  Periwound: intact Dressing procedure/placement/frequency:  Cleanse wound with NS, pat dry.  Apply 1/4 thick layer of Santyl  to wound bed, top with saline moist gauze. Top with silicone foam.  Ok to lift foam daily for change of gauze and reapplication of Santyl . Ok to change silicone foam every 3 days.    WOC team will not follow at this time.  Please re-consult if new needs arise.   Thank you,  Doyal Polite, RN, MSN, [facility] WOC Team

## 2024-01-26 NOTE — Progress Notes (Signed)
 Patient refused to have skin assessed. Witnessed refusal by Nurse Orvin, RN.

## 2024-01-26 NOTE — Consult Note (Signed)
 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  Surgery has consulted and no need for surgical debridement at this time. Minimal devitalized tissue to wound bed, but depth is obscured by slough.   Left BKA site.  Constipation with stercoral colitis Wound type:chronic pressure, unstageable Left BKA surgical site Pressure Injury POA: Yes Measurement: 5 cm x 6 cm x 3.2 cm.with slough at 10:00.  Wound bed:see above Drainage (amount, consistency, odor) minimal serosanguinous effluent with musty odor Periwound: scarring to sacrum from previous nonintact skin Dressing procedure/placement/frequency: cleanse sacral wound with NS and pat dry. Fill wound bed with aquacel (LAWSON # [id number]) and fluffed gauze if needed due to depth.   COver with silicone foam dressing.  Change daily.  Dry dressing to Left BKA site PRevalon boot to right heel  Low air loss mattress Will not follow at this time.  Please re-consult if needed.  Darice Cooley MSN, RN, FNP-BC CWON Wound, Ostomy, Continence Nurse Outpatient [facility] [phone] Pager [phone]

## 2024-01-26 NOTE — Progress Notes (Signed)
 Patient appears to be withdrawn and lethargic. Informed Abigail Chaves. Attempted to acquire cbg but the patient refused. Witnessed by Nurse Orvin, RN

## 2024-01-26 NOTE — Progress Notes (Signed)
 Pt receives out-pt HD at [facility] on TTS 11:40 am chair time. Will assist as needed.   Randine Mungo Dialysis Navigator [phone]

## 2024-01-26 NOTE — Progress Notes (Signed)
 Patient refused to have labs taken. Notified Dr. Yetta Blanch.

## 2024-01-26 NOTE — Progress Notes (Signed)
 [facility] Progress Note  Subjective:  Seen in room No c/o's, sitting up in bed eating breakfast A little less frail than she was yesterday  Vitals:   01/25/24 1928 01/25/24 2328 01/26/24 0422 01/26/24 0751  BP: (!) 129/59 (!) 141/58 (!) 121/55 (!) 146/57  Pulse: 65 65 (!) 58 66  Resp: 20 16 20 17   Temp: 98.5 F (36.9 C) 99 F (37.2 C) 98.8 F (37.1 C) 99.3 F (37.4 C)  TempSrc: Oral Oral Oral Oral  SpO2: 97% 100% 100% 99%  Weight:      Height:        Exam: Gen no distress, frail in appearance No jvd or bruits Chest clear bilat to bases RRR no MRG Abd soft ntnd no mass or ascites +bs MS L BKA Ext 1-2+ RLE pretib edema Neuro , O x 3, deconditioned    RIJ TDC    Home bp meds: Norvasc  5 every day Catapres  patch 0.3 weekly Metoprolol  50 bid    OP HD: G-O TTS 3h   B350   60.3 kg   TDC   Heparin  2000 Last HD yest, came off 58.5kg Missed HD 7/19, o/w good compliance       Assessment/ Plan: Infected decub ulcer/ possible sacro osteomyelitis: IV abx per pmd. WBC coming down nicely.  ESRD: on HD TTS. Last HD Tuesday. Next HD today.  HTN: BP's wnl 140/60 range. Getting clonidine  per pmd.  Volume: some RLE edema 2+, no resp issues. Get vol down w/ HD and possibly lower dry wt.  Anemia of esrd: Hb 9-11 here, follow.     Myer Fret MD  [facility] 01/26/2024, 12:16 PM  Recent Labs  Lab 01/24/24 1826 01/25/24 0551 01/26/24 0841  HGB 10.4* 9.1* 9.2*  ALBUMIN  2.1*  --  1.8*  CALCIUM  8.1* 7.8* 7.9*  CREATININE 3.36* 3.81* 4.51*  K 4.5 4.2 4.7   No results for input(s): IRON , TIBC, FERRITIN in the last 168 hours. Inpatient medications:  apixaban   5 mg Oral BID   atorvastatin   20 mg Oral Daily   calcitRIOL   0.25 mcg Oral Q T,Th,Sa-HD   Chlorhexidine  Gluconate Cloth  6 each Topical Q0600   cloNIDine   0.3 mg Transdermal Q Wed   collagenase    Topical Daily   docusate sodium   100 mg Oral Once   gabapentin   100 mg Oral TID   hydrOXYzine   10  mg Oral TID   insulin  aspart  0-5 Units Subcutaneous QHS   insulin  aspart  0-6 Units Subcutaneous TID WC   isosorbide  mononitrate  30 mg Oral Once per day on Sunday Monday Wednesday Friday   lidocaine   1 patch Transdermal See admin instructions   multivitamin  1 tablet Oral Q2000   pantoprazole   40 mg Oral Daily   polyethylene glycol  17 g Oral Daily    piperacillin -tazobactam (ZOSYN )  IV 2.25 g (01/26/24 0655)   vancomycin      docusate sodium , oxyCODONE 

## 2024-01-27 ENCOUNTER — Encounter (HOSPITAL_COMMUNITY): Payer: Self-pay | Admitting: Internal Medicine

## 2024-01-27 ENCOUNTER — Inpatient Hospital Stay (HOSPITAL_COMMUNITY)

## 2024-01-27 ENCOUNTER — Other Ambulatory Visit: Payer: Self-pay

## 2024-01-27 DIAGNOSIS — M4628 Osteomyelitis of vertebra, sacral and sacrococcygeal region: Secondary | ICD-10-CM | POA: Diagnosis not present

## 2024-01-27 LAB — GLUCOSE, CAPILLARY
Glucose-Capillary: 175 mg/dL — ABNORMAL HIGH (ref 70–99)
Glucose-Capillary: 175 mg/dL — ABNORMAL HIGH (ref 70–99)
Glucose-Capillary: 124 mg/dL — ABNORMAL HIGH (ref 70–99)
Glucose-Capillary: 145 mg/dL — ABNORMAL HIGH (ref 70–99)

## 2024-01-27 LAB — CBC
HCT: 27.2 % — ABNORMAL LOW (ref 36.0–46.0)
Hemoglobin: 8.3 g/dL — ABNORMAL LOW (ref 12.0–15.0)
MCH: 23.1 pg — ABNORMAL LOW (ref 26.0–34.0)
MCHC: 30.5 g/dL (ref 30.0–36.0)
MCV: 75.6 fL — ABNORMAL LOW (ref 80.0–100.0)
Platelets: 388 K/uL (ref 150–400)
RBC: 3.6 MIL/uL — ABNORMAL LOW (ref 3.87–5.11)
RDW: 21.1 % — ABNORMAL HIGH (ref 11.5–15.5)
WBC: 26.1 K/uL — ABNORMAL HIGH (ref 4.0–10.5)
nRBC: 0 % (ref 0.0–0.2)

## 2024-01-27 LAB — HEPATITIS B SURFACE ANTIBODY, QUANTITATIVE: Hep B S AB Quant (Post): <3.5 m[IU]/mL — ABNORMAL LOW

## 2024-01-27 LAB — LACTIC ACID, PLASMA: Lactic Acid, Venous: 1.1 mmol/L (ref 0.5–1.9)

## 2024-01-27 MED ORDER — JUVEN PO PACK
1.0000 | PACK | Freq: Two times a day (BID) | ORAL | Status: DC
  Administered 2024-01-28 – 2024-02-03 (×9): 1 via ORAL
  Filled 2024-01-27 (×10): qty 1

## 2024-01-27 MED ORDER — ONDANSETRON HCL 4 MG/2ML IJ SOLN
4.0000 mg | Freq: Four times a day (QID) | INTRAMUSCULAR | Status: DC | PRN
  Administered 2024-01-27 – 2024-02-03 (×3): 4 mg via INTRAVENOUS
  Filled 2024-01-27 (×3): qty 2

## 2024-01-27 MED ORDER — VITAMIN C 500 MG PO TABS
1000.0000 mg | ORAL_TABLET | Freq: Every day | ORAL | Status: DC
  Administered 2024-01-27 – 2024-02-03 (×8): 1000 mg via ORAL
  Filled 2024-01-27 (×8): qty 2

## 2024-01-27 MED ORDER — SACCHAROMYCES BOULARDII 250 MG PO CAPS
250.0000 mg | ORAL_CAPSULE | Freq: Two times a day (BID) | ORAL | Status: DC
  Administered 2024-01-27 – 2024-02-03 (×14): 250 mg via ORAL
  Filled 2024-01-27 (×15): qty 1

## 2024-01-27 MED ORDER — CHLORHEXIDINE GLUCONATE CLOTH 2 % EX PADS: 6.0000 | MEDICATED_PAD | Freq: Every day | CUTANEOUS | Status: DC

## 2024-01-27 MED ORDER — SODIUM CHLORIDE 0.9 % IV BOLUS
500.0000 mL | Freq: Once | INTRAVENOUS | Status: AC
  Administered 2024-01-27: 500 mL via INTRAVENOUS

## 2024-01-27 MED ORDER — HYDROMORPHONE HCL 1 MG/ML IJ SOLN
0.5000 mg | INTRAMUSCULAR | Status: DC | PRN
  Administered 2024-01-27 – 2024-02-03 (×5): 0.5 mg via INTRAVENOUS
  Filled 2024-01-27 (×5): qty 0.5

## 2024-01-27 MED ORDER — PROSOURCE PLUS PO LIQD
30.0000 mL | Freq: Two times a day (BID) | ORAL | Status: DC
  Administered 2024-01-28: 30 mL via ORAL
  Filled 2024-01-27 (×2): qty 30

## 2024-01-27 MED ORDER — ZINC SULFATE 220 (50 ZN) MG PO CAPS
220.0000 mg | ORAL_CAPSULE | Freq: Every day | ORAL | Status: DC
  Administered 2024-01-27 – 2024-01-30 (×4): 220 mg via ORAL
  Filled 2024-01-27 (×4): qty 1

## 2024-01-27 NOTE — Progress Notes (Signed)
 [facility] Progress Note Patient: [name] [name] FMW:[id number] DOB: 22-Nov-1965 DOA: 01/24/2024  DOS: the patient was seen and examined on 01/27/2024  Brief Hospital Course: Patient with PMH of ESRD on HD TTS, anemia of CKD, type II DM, neuropathy, left eye blindness, HTN, GERD, lumbar radiculopathy, left BKA, CVA presented to the hospital with complaints of vaginal bleeding. Found to have sacral osteomyelitis with concern for bleeding coming from that site. Currently receiving antibiotics. Assessment and Plan: Sacral osteomyelitis. CT scan of the abdomen and pelvis shows evidence of sacral osteomyelitis, some gas. General surgery was also consulted. Recommends no intervention or indication for I&D. For now we will continue with IV antibiotic. Unfortunately no blood cultures performed at the time of the admission.  Repeat culture performed on 7/24.  Will monitor. Pending the results we will consult ID.  Proctocolitis. Severe constipation. Continue current antibiotic Now having multiple diarrhea therefore we will be holding bowel regimen. Monitor  Vaginal bleeding. Patient reports that she actually has been having vaginal bleeding ongoing for last few months. Was actually seen by GYN outpatient for the same.  Per OB/GYN the source of bleeding is less likely vaginal in nature and more likely coming from her sacral ulcer. No further workup was recommended outpatient. Was evaluated for infection as well as given antibiotic. Continue Eliquis  for now. So far no bleeding reported. H&H relatively stable as well.  Possible cystitis. Receiving IV antibiotics Outpatient follow-up.  ESRD on HD. TTS schedule. Appreciate nephrology consultation.  Type II DM with peripheral neuropathy.  With long-term insulin  use. Last hemoglobin A1c 7.1. Refusing CBG. Continue sliding scale insulin . Continue gabapentin .  Anemia of CKD as well as chronic disease in the setting of  osteomyelitis. H&H relatively stable.  For now we will monitor. Also reports that she has vaginal bleeding which most likely bleeding from the sacral ulcer. For now we will continue to monitor H&H.  Intractable nausea and vomiting. X-ray unremarkable other than constipation. Monitor.   Subjective: Had an episode of vomiting earlier today.  No nausea at the time of my evaluation.  No abdominal pain.  Continues to have chronic sacral pain.  Had 3 BMs so far today.  Physical Exam: Clear to auscultation. Aortic systolic murmur S1-S2 present Bowel sound present.  Diffusely tender. No edema. Left BKA.  Data Reviewed: I have Reviewed nursing notes, Vitals, and Lab results. Since last encounter, pertinent lab results CBC and BMP   . I have ordered test including CBC and BMP  .   Disposition: Status is: Inpatient Remains inpatient appropriate because: Monitor for improvement in fever apixaban  (ELIQUIS ) tablet 5 mg   Family Communication: No one at bedside Level of care: Med-Surg   Vitals:   01/27/24 0330 01/27/24 0741 01/27/24 0903 01/27/24 1730  BP: (!) 156/68 (!) 156/69  (!) 109/54  Pulse: 78 71  66  Resp: 16 16  16   Temp: 99.3 F (37.4 C) (!) 100.4 F (38 C) 99.8 F (37.7 C) 98.7 F (37.1 C)  TempSrc: Oral Oral Oral Oral  SpO2: 100% 96%  100%  Weight:      Height:         Author: Yetta Blanch, MD 01/27/2024 7:20 PM  Please look on [email or web address] to find out who is on call.

## 2024-01-27 NOTE — NC FL2 (Signed)
 [place]  MEDICAID FL2 LEVEL OF CARE FORM     IDENTIFICATION  Patient Name: [name] [name] Birthdate: 10-02-1965 Sex: female Admission Date (Current Location): 01/24/2024  [facility] and [place] Number:  Producer, television/film/video and Address:  [facility] [place], [address] [place], [place] [address]      Provider Number: [id number]  Attending Physician Name and Address:  Tobie Yetta HERO, MD  Relative Name and Phone Number:  LAVAYA, DEFREITAS (Spouse)  [phone]    Current Level of Care: Hospital Recommended Level of Care: Skilled Nursing Facility Prior Approval Number:    Date Approved/Denied:   PASRR Number: [id number]  Discharge Plan: SNF    Current Diagnoses: Patient Active Problem List   Diagnosis Date Noted   Sacral osteomyelitis (HCC) 01/24/2024   Proctocolitis 01/24/2024   Osteomyelitis of left foot (HCC) 11/24/2023   Cellulitis of left lower extremity 11/21/2023   Anemia due to chronic kidney disease 11/21/2023   Hammertoes of both feet 05/21/2022   Hyperpigmentation 05/21/2022   Hypertensive emergency 05/20/2022   Hypertensive urgency, malignant 05/19/2022   Pyocystis 05/19/2022   Hypokalemia 05/19/2022   Cellulitis 04/09/2021   Cellulitis of suprapubic region 04/09/2021   Leukocytosis 04/09/2021   Type 2 diabetes mellitus (HCC) 04/09/2021   Osteoporosis 10/13/2020   Fever    Closed fracture of left proximal tibia 10/10/2020   Fracture of femoral neck, right (HCC) 10/02/2020   Pressure injury of skin 03/12/2020   Sepsis (HCC) 03/07/2020   ESRD (end stage renal disease) (HCC) 03/07/2020   Hyperkalemia 03/07/2020   Acute cystitis 03/07/2020   Acute metabolic encephalopathy 03/07/2020   Impaired vision in both eyes 11/19/2015   Diabetes mellitus with complication (HCC)    Intractable headache    Syncope 07/28/2015   Chronic headaches 07/28/2015   Chest pain 05/22/2015   Urinary frequency 03/13/2015   Easy bruising 10/09/2014    Encounter for preventative adult health care exam with abnormal findings 01/11/2014   Constipation 01/11/2014   GERD (gastroesophageal reflux disease) 01/11/2014   Helicobacter positive gastritis 12/06/2013   Dizziness and giddiness 10/12/2013   Essential hypertension 09/19/2013   Back pain 06/11/2013   Lumbosacral spondylosis without myelopathy 02/20/2013   Diabetic neuropathy (HCC) 01/09/2013   DM (diabetes mellitus), type 2, uncontrolled 10/02/2012   MITRAL REGURGITATION, mild 10/31/2009   Pleural effusion 10/20/2009    Orientation RESPIRATION BLADDER Height & Weight     Self, Time, Situation, Place  Normal Incontinent Weight: 129 lb 10.1 oz (58.8 kg) Height:  5' (152.4 cm)  BEHAVIORAL SYMPTOMS/MOOD NEUROLOGICAL BOWEL NUTRITION STATUS      Incontinent Diet (see dc summary)  AMBULATORY STATUS COMMUNICATION OF NEEDS Skin   Extensive Assist Verbally Other (Comment) Isidore)                       Personal Care Assistance Level of Assistance  Bathing, Feeding, Dressing Bathing Assistance: Maximum assistance Feeding assistance: Limited assistance Dressing Assistance: Maximum assistance     Functional Limitations Info  Sight, Speech, Hearing Sight Info: Impaired Hearing Info: Adequate Speech Info: Adequate    SPECIAL CARE FACTORS FREQUENCY                       Contractures Contractures Info: Not present    Additional Factors Info  Code Status, Allergies Code Status Info: Full Allergies Info: Amitriptyline   Ciprofloxacin   Ibuprofen   Aspirin   Na Ferric Gluc Cplx In Sucrose  Acetaminophen   Semaglutide  Tramadol            Current Medications (01/27/2024):  This is the current hospital active medication list Current Facility-Administered Medications  Medication Dose Route Frequency Provider Last Rate Last Admin   (feeding supplement) PROSource Plus liquid 30 mL  30 mL Oral BID BM Patel, Pranav M, MD       apixaban  (ELIQUIS ) tablet 5 mg  5 mg Oral BID  Rathore, Vasundhra, MD   5 mg at 01/27/24 0809   ascorbic acid  (VITAMIN C ) tablet 1,000 mg  1,000 mg Oral Daily Patel, Pranav M, MD   1,000 mg at 01/27/24 0809   atorvastatin  (LIPITOR) tablet 20 mg  20 mg Oral Daily Rathore, Vasundhra, MD   20 mg at 01/27/24 9190   calcitRIOL  (ROCALTROL ) capsule 0.25 mcg  0.25 mcg Oral Q T,Th,Sa-HD Alfornia Madison, MD       Chlorhexidine  Gluconate Cloth 2 % PADS 6 each  6 each Topical Q0600 Geralynn Charleston, MD   6 each at 01/27/24 1124   cloNIDine  (CATAPRES  - Dosed in mg/24 hr) patch 0.3 mg  0.3 mg Transdermal Q Wed Rathore, Vasundhra, MD   0.3 mg at 01/25/24 9063   collagenase  (SANTYL ) ointment   Topical Daily Tobie Yetta HERO, MD       docusate sodium  (COLACE) capsule 100 mg  100 mg Oral Daily PRN Alfornia Madison, MD       gabapentin  (NEURONTIN ) capsule 100 mg  100 mg Oral TID Rathore, Vasundhra, MD   100 mg at 01/27/24 0809   HYDROmorphone  (DILAUDID ) injection 0.5 mg  0.5 mg Intravenous Q4H PRN Patel, Pranav M, MD   0.5 mg at 01/27/24 1412   hydrOXYzine  (ATARAX ) tablet 10 mg  10 mg Oral TID Rathore, Vasundhra, MD   10 mg at 01/27/24 0809   insulin  aspart (novoLOG ) injection 0-5 Units  0-5 Units Subcutaneous QHS Rathore, Vasundhra, MD       insulin  aspart (novoLOG ) injection 0-6 Units  0-6 Units Subcutaneous TID WC Rathore, Vasundhra, MD   1 Units at 01/27/24 1302   lidocaine  (LIDODERM ) 5 % 1 patch  1 patch Transdermal See admin instructions Tobie Yetta HERO, MD       multivitamin (RENA-VIT) tablet 1 tablet  1 tablet Oral Q2000 Rathore, Vasundhra, MD   1 tablet at 01/25/24 2157   nutrition supplement (JUVEN) (JUVEN) powder packet 1 packet  1 packet Oral BID BM Patel, Pranav M, MD       ondansetron  (ZOFRAN ) injection 4 mg  4 mg Intravenous Q6H PRN Patel, Pranav M, MD   4 mg at 01/27/24 1124   oxyCODONE  (Oxy IR/ROXICODONE ) immediate release tablet 5 mg  5 mg Oral Q4H PRN Patel, Pranav M, MD   5 mg at 01/26/24 1745   pantoprazole  (PROTONIX ) EC tablet 40 mg  40  mg Oral Daily Rathore, Vasundhra, MD   40 mg at 01/27/24 0809   piperacillin -tazobactam (ZOSYN ) IVPB 2.25 g  2.25 g Intravenous Q8H Amend, Caron G, RPH 100 mL/hr at 01/27/24 1303 2.25 g at 01/27/24 1303   polyethylene glycol (MIRALAX  / GLYCOLAX ) packet 17 g  17 g Oral Daily Rathore, Vasundhra, MD   17 g at 01/27/24 0809   saccharomyces boulardii (FLORASTOR) capsule 250 mg  250 mg Oral BID Patel, Pranav M, MD   250 mg at 01/27/24 1302   vancomycin  (VANCOREADY) IVPB 500 mg/100 mL  500 mg Intravenous Q T,Th,Sa-HD Hershal Vito MATSU, RPH 100 mL/hr at 01/26/24 1616 500 mg at 01/26/24  1616   zinc  sulfate (50mg  elemental zinc ) capsule 220 mg  220 mg Oral Daily Patel, Pranav M, MD   220 mg at 01/27/24 9190     Discharge Medications: Please see discharge summary for a list of discharge medications.  Relevant Imaging Results:  Relevant Lab Results:   Additional Information SSN: [id number]  Lendia Dais, LCSW

## 2024-01-27 NOTE — Progress Notes (Signed)
 [facility] Progress Note  Subjective:  Seen in room No c/o's, feeding herself  Vitals:   01/26/24 2021 01/27/24 0330 01/27/24 0741 01/27/24 0903  BP: (!) 158/67 (!) 156/68 (!) 156/69   Pulse: 78 78 71   Resp: 18 16 16    Temp: 98.5 F (36.9 C) 99.3 F (37.4 C) (!) 100.4 F (38 C) 99.8 F (37.7 C)  TempSrc: Oral Oral Oral Oral  SpO2: 97% 100% 96%   Weight:      Height:        Exam: Gen no distress, alert  No jvd or bruits Chest clear bilat to bases RRR no MRG Abd soft ntnd no mass or ascites +bs MS L BKA Ext 1+ RLE pretib edema Neuro , O x 3, deconditioned    RIJ TDC    Home bp meds: Norvasc  5 every day Catapres  patch 0.3 weekly Metoprolol  50 bid    OP HD: G-O TTS 3h   B350   60.3 kg   TDC   Heparin  2000 Last HD yest, came off 58.5kg Missed HD 7/19, o/w good compliance       Assessment/ Plan: Infected sacral decub/ possible osteo: IV abx per pmd. WBC coming down.   ESRD: on HD TTS. Had HD here yesterday. Next HD Sat.  HTN: BP's wnl 140/60 range. Getting clonidine , bp's stable.  Volume: mild edema, no resp issues. 2 L under dry wt. Follow.  Anemia of esrd: Hb 9-11 here, follow.      Myer Fret MD  [facility] 01/27/2024, 2:32 PM  Recent Labs  Lab 01/24/24 1826 01/25/24 0551 01/26/24 0841  HGB 10.4* 9.1* 9.2*  ALBUMIN  2.1*  --  1.8*  CALCIUM  8.1* 7.8* 7.9*  CREATININE 3.36* 3.81* 4.51*  K 4.5 4.2 4.7   No results for input(s): IRON , TIBC, FERRITIN in the last 168 hours. Inpatient medications:  (feeding supplement) PROSource Plus  30 mL Oral BID BM   apixaban   5 mg Oral BID   ascorbic acid   1,000 mg Oral Daily   atorvastatin   20 mg Oral Daily   calcitRIOL   0.25 mcg Oral Q T,Th,Sa-HD   Chlorhexidine  Gluconate Cloth  6 each Topical Q0600   [START ON 01/28/2024] Chlorhexidine  Gluconate Cloth  6 each Topical Q0600   cloNIDine   0.3 mg Transdermal Q Wed   collagenase    Topical Daily   gabapentin   100 mg Oral TID   hydrOXYzine    10 mg Oral TID   insulin  aspart  0-5 Units Subcutaneous QHS   insulin  aspart  0-6 Units Subcutaneous TID WC   lidocaine   1 patch Transdermal See admin instructions   multivitamin  1 tablet Oral Q2000   nutrition supplement (JUVEN)  1 packet Oral BID BM   pantoprazole   40 mg Oral Daily   polyethylene glycol  17 g Oral Daily   saccharomyces boulardii  250 mg Oral BID   zinc  sulfate (50mg  elemental zinc )  220 mg Oral Daily    piperacillin -tazobactam (ZOSYN )  IV 2.25 g (01/27/24 1303)   vancomycin  500 mg (01/26/24 1616)   docusate sodium , HYDROmorphone  (DILAUDID ) injection, ondansetron  (ZOFRAN ) IV, oxyCODONE 

## 2024-01-27 NOTE — Progress Notes (Signed)
 Pharmacy Antibiotic Note  [name] [name] is a 58 y.o. female admitted on 01/24/2024 with Infected large sacral decubitus ulcer with possible sacral osteomyelitis.  Pharmacy has been consulted for Vancomycin  and Zosyn  dosing. Recent hospital admission 5/19-12/06/2023 for infected diabetic foot ulcer with cellulitis/gangrene and gas-forming infection of the left heel/osteomyelitis status post left BKA.  ESRD - HD T/T/S, afebrile, WBC elevated at 19.4.  Plan: Zosyn  2.25mg  IV Q8H Vancomycin   500mg  IV qHD TTS Will f/u HD schedule, micro data, and pt's clinical condition Vanc levels prn   Height: 5' (152.4 cm) Weight: 58.8 kg (129 lb 10.1 oz) IBW/kg (Calculated) : 45.5  Temp (24hrs), Avg:99 F (37.2 C), Min:97.8 F (36.6 C), Max:100.4 F (38 C)  Recent Labs  Lab 01/24/24 1826 01/25/24 0551 01/26/24 0841  WBC 29.6* 23.7* 19.4*  CREATININE 3.36* 3.81* 4.51*    Estimated Creatinine Clearance: 11 mL/min (A) (by C-G formula based on SCr of 4.51 mg/dL (H)).    Allergies  Allergen Reactions   Amitriptyline  Other (See Comments), Swelling and Nausea And Vomiting    Hypotension, Mental Status Changes, Confusion - Allergic, per MAR   Ciprofloxacin  Diarrhea, Nausea And Vomiting and Other (See Comments)    Severe vomiting requiring ED visit, IV reglan  and IV fluids- Allergic, per MAR   Ibuprofen  Itching   Aspirin  Itching and Other (See Comments)    Pt endorses getting itchy in the past with aspirin . Tolerated dose 04/06/22.   Na Ferric Gluc Cplx In Sucrose Other (See Comments)   Acetaminophen  Itching, Other (See Comments) and Nausea And Vomiting    Allergic, per [facility]   Semaglutide Nausea Only and Other (See Comments)    Allergic, per MAR   Tramadol  Itching   Vanc 7/22 >>  Zosyn  7/22 >>   7/24 BCx -   Eulene Pekar D. Lendell, PharmD, BCPS, BCCCP 01/27/2024, 1:14 PM

## 2024-01-27 NOTE — Plan of Care (Signed)
  Problem: Pain Managment: Goal: General experience of comfort will improve and/or be controlled Outcome: Progressing   Problem: Safety: Goal: Ability to remain free from injury will improve Outcome: Progressing

## 2024-01-27 NOTE — Progress Notes (Signed)
 Noted pt is lethargic but responsive to voice. Notified on call provider. Ordered bolus 500cc. Will repeat Vitals after. Will closely monior.    01/27/24 2113  Vitals  Temp 99.6 F (37.6 C)  Temp Source Oral  BP (!) 77/36  MAP (mmHg) (!) 50  BP Location Right Arm  BP Method Automatic  Patient Position (if appropriate) Lying  Pulse Rate 61  Pulse Rate Source Monitor  Resp 18  MEWS COLOR  MEWS Score Color Yellow  Oxygen Therapy  SpO2 100 %  O2 Device Room Air  MEWS Score  MEWS Temp 0  MEWS Systolic 2  MEWS Pulse 0  MEWS RR 0  MEWS LOC 0  MEWS Score 2  Provider Notification  Provider Name/Title Lavanda Horns, NP  Date Provider Notified 01/27/24  Time Provider Notified 2127  Method of Notification Page (via secure chat)  Notification Reason Other (Comment) (Yellow mews bp 77/36)  Provider response See new orders  Date of Provider Response 01/27/24  Time of Provider Response 2128

## 2024-01-27 NOTE — Progress Notes (Signed)
     Patient Name: [name] [name]           DOB: 10-Jun-1966  MRN: [id number]      Admission Date: 01/24/2024  Attending Provider: Tobie Yetta HERO, MD  Primary Diagnosis: Sacral osteomyelitis [facility])   Level of care: Med-Surg   CROSS COVERAGE NOTE       Date of Service   01/27/2024   [name] [name], 58 y.o. female, was admitted on 01/24/2024 for infected large sacral decubitus bleeding ulcer and sacral osteomyelitis.   On IV antibiotics.  No indications for I&D per general surgery.  History of ESRD on HD TTS, anemia of CKD, HTN.   HPI/Events of Note   Notified of BP 77/36, checked on different site and verified.    Hypotension, SBP 70s with MAP 50's All other vital stable.  Afebrile.  On room air. Per RN assessment, patient is asymptomatic.  Sleepy, but easy to arouse and wake up.  CBG normal.   Has reported several episodes of diarrhea, likely from antibiotic use Found to have sacral ulcer bleeding on admission. Currently on Eliquis .  No further bleeding has been reported.  H&H has been relatively stable.   Interventions/ Plan   500 cc bolus CBC- hgb stable Lactic acid- negative       Addendum- BP improvement after bolus.    Gustaf Mccarter, DNP, ACNPC- AG [facility] [place]

## 2024-01-28 DIAGNOSIS — M4628 Osteomyelitis of vertebra, sacral and sacrococcygeal region: Secondary | ICD-10-CM | POA: Diagnosis not present

## 2024-01-28 LAB — COMPREHENSIVE METABOLIC PANEL WITH GFR
ALT: 8 U/L (ref 0–44)
AST: 16 U/L (ref 15–41)
Albumin: 1.5 g/dL — ABNORMAL LOW (ref 3.5–5.0)
Alkaline Phosphatase: 112 U/L (ref 38–126)
Anion gap: 12 (ref 5–15)
BUN: 24 mg/dL — ABNORMAL HIGH (ref 6–20)
CO2: 22 mmol/L (ref 22–32)
Calcium: 7.5 mg/dL — ABNORMAL LOW (ref 8.9–10.3)
Chloride: 97 mmol/L — ABNORMAL LOW (ref 98–111)
Creatinine, Ser: 3.62 mg/dL — ABNORMAL HIGH (ref 0.44–1.00)
GFR, Estimated: 14 mL/min — ABNORMAL LOW (ref >60)
Glucose, Bld: 167 mg/dL — ABNORMAL HIGH (ref 70–99)
Potassium: 3.7 mmol/L (ref 3.5–5.1)
Sodium: 131 mmol/L — ABNORMAL LOW (ref 135–145)
Total Bilirubin: 1.1 mg/dL (ref 0.0–1.2)
Total Protein: 6 g/dL — ABNORMAL LOW (ref 6.5–8.1)

## 2024-01-28 LAB — CBC WITH DIFFERENTIAL/PLATELET
Abs Immature Granulocytes: 0.16 K/uL — ABNORMAL HIGH (ref 0.00–0.07)
Basophils Absolute: 0.1 K/uL (ref 0.0–0.1)
Basophils Relative: 0 %
Eosinophils Absolute: 0.3 K/uL (ref 0.0–0.5)
Eosinophils Relative: 1 %
HCT: 26.9 % — ABNORMAL LOW (ref 36.0–46.0)
Hemoglobin: 8.4 g/dL — ABNORMAL LOW (ref 12.0–15.0)
Immature Granulocytes: 1 %
Lymphocytes Relative: 16 %
Lymphs Abs: 3.5 K/uL (ref 0.7–4.0)
MCH: 23.2 pg — ABNORMAL LOW (ref 26.0–34.0)
MCHC: 31.2 g/dL (ref 30.0–36.0)
MCV: 74.3 fL — ABNORMAL LOW (ref 80.0–100.0)
Monocytes Absolute: 1.5 K/uL — ABNORMAL HIGH (ref 0.1–1.0)
Monocytes Relative: 7 %
Neutro Abs: 16.6 K/uL — ABNORMAL HIGH (ref 1.7–7.7)
Neutrophils Relative %: 75 %
Platelets: 377 K/uL (ref 150–400)
RBC: 3.62 MIL/uL — ABNORMAL LOW (ref 3.87–5.11)
RDW: 20.9 % — ABNORMAL HIGH (ref 11.5–15.5)
WBC: 22.1 K/uL — ABNORMAL HIGH (ref 4.0–10.5)
nRBC: 0 % (ref 0.0–0.2)

## 2024-01-28 LAB — GLUCOSE, CAPILLARY
Glucose-Capillary: 150 mg/dL — ABNORMAL HIGH (ref 70–99)
Glucose-Capillary: 197 mg/dL — ABNORMAL HIGH (ref 70–99)
Glucose-Capillary: 168 mg/dL — ABNORMAL HIGH (ref 70–99)

## 2024-01-28 LAB — MAGNESIUM: Magnesium: 1.6 mg/dL — ABNORMAL LOW (ref 1.7–2.4)

## 2024-01-28 MED ORDER — CLONIDINE HCL 0.2 MG/24HR TD PTWK
0.2000 mg | MEDICATED_PATCH | TRANSDERMAL | Status: DC
  Administered 2024-01-28: 0.2 mg via TRANSDERMAL
  Filled 2024-01-28: qty 1

## 2024-01-28 MED ORDER — PROSOURCE PLUS PO LIQD
30.0000 mL | Freq: Three times a day (TID) | ORAL | Status: DC
  Administered 2024-01-29 – 2024-02-03 (×6): 30 mL via ORAL
  Filled 2024-01-28 (×14): qty 30

## 2024-01-28 MED ORDER — HEPARIN SODIUM (PORCINE) 1000 UNIT/ML IJ SOLN
INTRAMUSCULAR | Status: AC
  Filled 2024-01-28: qty 4

## 2024-01-28 MED ORDER — MAGNESIUM SULFATE 2 GM/50ML IV SOLN
2.0000 g | Freq: Once | INTRAVENOUS | Status: AC
  Administered 2024-01-28: 2 g via INTRAVENOUS
  Filled 2024-01-28: qty 50

## 2024-01-28 MED ORDER — HEPARIN SODIUM (PORCINE) 1000 UNIT/ML IJ SOLN
INTRAMUSCULAR | Status: AC
  Filled 2024-01-28: qty 2

## 2024-01-28 MED ORDER — VANCOMYCIN HCL 500 MG/100ML IV SOLN
INTRAVENOUS | Status: AC
  Filled 2024-01-28: qty 100

## 2024-01-28 MED ORDER — KATE FARMS STANDARD 1.4 PO LIQD
325.0000 mL | Freq: Two times a day (BID) | ORAL | Status: DC
  Administered 2024-01-29 – 2024-02-03 (×9): 325 mL via ORAL
  Filled 2024-01-28 (×12): qty 325

## 2024-01-28 NOTE — Progress Notes (Signed)
 [facility] Progress Note  Subjective:  Seen in room No c/o's  Vitals:   01/27/24 2224 01/27/24 2350 01/28/24 0324 01/28/24 0715  BP: (!) 101/43 (!) 116/52 (!) 121/50 (!) 151/65  Pulse:  64 61 63  Resp: 17 18 19 16   Temp:  98.4 F (36.9 C) 98 F (36.7 C) 97.8 F (36.6 C)  TempSrc:  Oral Oral Oral  SpO2: 97% 100% 100% 93%  Weight:      Height:        Exam: Gen no distress, alert  No jvd or bruits Chest clear bilat to bases RRR no MRG Abd soft ntnd no mass or ascites +bs MS L BKA Ext 1+ RLE pretib edema Neuro , O x 3, deconditioned    RIJ TDC    Home bp meds: Norvasc  5 every day Catapres  patch 0.3 weekly Metoprolol  50 bid    OP HD: G-O TTS 3h   B350   60.3 kg   TDC   Heparin  2000 Last HD yest, came off 58.5kg Missed HD 7/19, o/w good compliance       Assessment/ Plan: Infected sacral decub/ possible osteo: IV abx per pmd. WBC up and down.    ESRD: on HD TTS. HD today/ tonight.  HTN: BP's wnl 140/60 range. Getting clonidine  0.3 patch\, home metoprolol  and norvasc  on hold.   Volume: mild RLE edema, no resp issues. 2 kg under dry Anemia of esrd: Hb 9-11 here, follow.      Myer Fret MD  [facility] 01/28/2024, 2:35 PM  Recent Labs  Lab 01/26/24 0841 01/27/24 2144 01/28/24 0803  HGB 9.2* 8.3* 8.4*  ALBUMIN  1.8*  --  1.5*  CALCIUM  7.9*  --  7.5*  CREATININE 4.51*  --  3.62*  K 4.7  --  3.7   No results for input(s): IRON , TIBC, FERRITIN in the last 168 hours. Inpatient medications:  (feeding supplement) PROSource Plus  30 mL Oral TID BM   apixaban   5 mg Oral BID   ascorbic acid   1,000 mg Oral Daily   atorvastatin   20 mg Oral Daily   calcitRIOL   0.25 mcg Oral Q T,Th,Sa-HD   Chlorhexidine  Gluconate Cloth  6 each Topical Q0600   cloNIDine   0.2 mg Transdermal Weekly   collagenase    Topical Daily   gabapentin   100 mg Oral TID   hydrOXYzine   10 mg Oral TID   insulin  aspart  0-5 Units Subcutaneous QHS   insulin  aspart  0-6 Units  Subcutaneous TID WC   lidocaine   1 patch Transdermal See admin instructions   multivitamin  1 tablet Oral Q2000   nutrition supplement (JUVEN)  1 packet Oral BID BM   pantoprazole   40 mg Oral Daily   saccharomyces boulardii  250 mg Oral BID   zinc  sulfate (50mg  elemental zinc )  220 mg Oral Daily    piperacillin -tazobactam (ZOSYN )  IV 2.25 g (01/28/24 0440)   vancomycin  500 mg (01/26/24 1616)   docusate sodium , HYDROmorphone  (DILAUDID ) injection, ondansetron  (ZOFRAN ) IV, oxyCODONE 

## 2024-01-28 NOTE — Progress Notes (Signed)
 [facility] Progress Note Patient: [name] [name] FMW:[id number] DOB: [birth year] DOA: 01/24/2024  DOS: the patient was seen and examined on 01/28/2024  Brief Hospital Course: Patient with PMH of ESRD on HD TTS, anemia of CKD, type II DM, neuropathy, left eye blindness, HTN, GERD, lumbar radiculopathy, left BKA, CVA presented to the hospital with complaints of vaginal bleeding. Found to have sacral osteomyelitis with concern for bleeding coming from that site. Currently receiving antibiotics. Assessment and Plan: Sacral osteomyelitis. CT scan of the abdomen and pelvis shows evidence of sacral osteomyelitis, some gas. General surgery was also consulted. Recommends no intervention or indication for I&D. For now we will continue with IV antibiotic. Unfortunately no blood cultures performed at the time of the admission.  Repeat culture performed on 7/24.  Will monitor.  So far no growth. Pending the results we will consult ID.  Proctocolitis. Severe constipation. Continue current antibiotic Now having multiple diarrhea therefore we will be holding bowel regimen. Monitor  Vaginal bleeding. Patient reports that she actually has been having vaginal bleeding ongoing for last few months. Was actually seen by GYN outpatient for the same.  Per OB/GYN the source of bleeding is less likely vaginal in nature and more likely coming from her sacral ulcer. No further workup was recommended outpatient. Was evaluated for infection as well as given antibiotic. Continue Eliquis  for now. So far no bleeding reported. H&H relatively stable as well.  Possible cystitis. Receiving IV antibiotics Outpatient follow-up.  ESRD on HD. TTS schedule. Appreciate nephrology consultation.  Type II DM with peripheral neuropathy.  With long-term insulin  use. Last hemoglobin A1c 7.1. Refusing CBG. Continue sliding scale insulin . Continue gabapentin .  Anemia of CKD as well as chronic disease in the setting of  osteomyelitis. H&H relatively stable.  For now we will monitor. Also reports that she has vaginal bleeding which most likely bleeding from the sacral ulcer. For now we will continue to monitor H&H.  Intractable nausea and vomiting. X-ray unremarkable other than constipation. Monitor.   Subjective: Had an episode of hypotension last night but blood pressure now stable.  No nausea no vomiting.  Diarrhea improving.  No abdominal pain.  Pain in her sacral area unchanged.  Physical Exam: Clear to auscultation Bowel sound present. S1-S2 present Nontender. No edema of right lower extremity.  Left BKA present.  No ulceration of the right lower extremity.  Data Reviewed: I have Reviewed nursing notes, Vitals, and Lab results. Since last encounter, pertinent lab results CBC and BMP   . I have ordered test including CBC and BMP  .  Disposition: Status is: Inpatient Remains inpatient appropriate because: Monitor for culture clearance. apixaban  (ELIQUIS ) tablet 5 mg   Family Communication: No one at bedside Level of care: Med-Surg   Vitals:   01/28/24 1645 01/28/24 1648 01/28/24 1700 01/28/24 1730  BP: (!) 82/54 (!) 104/58 (!) 116/59 109/64  Pulse: 78 77 77 81  Resp: 10 13 11 10   Temp:      TempSrc:      SpO2: 100% 100% 99% 100%  Weight:      Height:         Author: Yetta Blanch, MD 01/28/2024 5:52 PM  Please look on [email or web address] to find out who is on call.

## 2024-01-28 NOTE — Progress Notes (Signed)
 Initial Nutrition Assessment  DOCUMENTATION CODES:   Not applicable  INTERVENTION:   - Continue 1 packet Juven BID to support wound healing, each packet provides 95 calories, 2.5 grams of protein, and 9.8 grams of carbohydrate  - Increase PROSource Plus 30 mL to TID, each packet provides 100 kcal and 15 grams of protein  - Liberalize diet to 2 gram sodium with 1200 mL fluid restriction to provide additional choices at meal times; if PO intake does not improve, consider liberalizing further to Regular diet  - Continue supplementation with vitamin C  and zinc   - Continue renal MVI  NUTRITION DIAGNOSIS:   Increased nutrient needs related to wound healing as evidenced by estimated needs.  GOAL:   Patient will meet greater than or equal to 90% of their needs  MONITOR:   PO intake, Supplement acceptance, Labs, Weight trends, Skin, I & O's  REASON FOR ASSESSMENT:   Consult Wound healing  ASSESSMENT:   58 year old female who presented to the ED on 01/24/24 with concern for vaginal bleeding. PMH of T2DM, ESRD on iHD, GERD, chronic anemia, mitral regurgitation, recent admission for infected diabetic foot ulcer s/p L BKA, L eye blindness, HTN, CVA. Pt admitted with infected large sacral decubitus ulcer with possible sacral osteomyelitis and proctocolitis.  RD working remotely. Unable to reach pt via phone call to room.  Pt currently has Renal diet with 1200 mL fluid restriction ordered. PO intake has been poor to fair, ranging 10-50%. Will liberalize diet to 2 gram sodium while maintaining fluid restriction in hopes of promoting increased PO intake with additional food choices. Can consider liberalizing diet further to Regular.  Pt currently with PROSource Plus and Juven ordered. Will continue these and increase PROSource Plus from BID to TID. Recommend continuing vitamin C , zinc , and renal MVI.  Reviewed weight history in chart. Pt with a 3.7 kg or 5.9% weight loss from 12/06/23 to  01/26/24. This is not quite significant for timeframe but is concerning given non-healing wounds. Current weight may be falsely elevated due to edema which is documented by nursing. Suspect pt with some degree of malnutrition but unable to confirm at this time.  EDW: 60.3 kg Admit weight: 59.9 kg Current weight: 58.8 kg on 01/26/24 post-iHD  Meal Completion: 10-50%  Medications reviewed and include: PROSource Plus 30 mL BID, vitamin C  1000 mg daily, calcitriol , SSI, rena-vit, Juven BID, protonix , florastor, zinc  sulfate 220 mg daily, IV magnesium  sulfate 2 grams x 1, IV zosyn , IV vancomycin   Labs reviewed: sodium 131, chloride 97, magnesium  1.6, CRP 19.2, WBC 22.1, hemoglobin 8.4 CBG's: 124-197 x 24 hours  I/O's: +1.7 L since admit  NUTRITION - FOCUSED PHYSICAL EXAM:  Unable to complete. RD working remotely.  Diet Order:   Diet Order             Diet 2 gram sodium Room service appropriate? Yes; Fluid consistency: Thin; Fluid restriction: 1200 mL Fluid  Diet effective now                   EDUCATION NEEDS:   Not appropriate for education at this time  Skin:  Skin Assessment: Skin Integrity Issues: Stage III: Sacrum Other: L knee infection s/p prior BKA  Last BM:  01/27/24 multiple large type 5  Height:   Ht Readings from Last 1 Encounters:  01/24/24 5' (1.524 m)    Weight:   Wt Readings from Last 1 Encounters:  01/26/24 58.8 kg    BMI:  Body mass index  is 25.32 kg/m.  Estimated Nutritional Needs:   Kcal:  1750-1950  Protein:  85-100 grams  Fluid:  1000 mL + UOP    Mallie Satchel, MS, RD, LDN Registered Dietitian II Please see AMiON for contact information.

## 2024-01-29 DIAGNOSIS — M4628 Osteomyelitis of vertebra, sacral and sacrococcygeal region: Secondary | ICD-10-CM | POA: Diagnosis not present

## 2024-01-29 LAB — COMPREHENSIVE METABOLIC PANEL WITH GFR
ALT: 7 U/L (ref 0–44)
AST: 20 U/L (ref 15–41)
Albumin: 1.6 g/dL — ABNORMAL LOW (ref 3.5–5.0)
Alkaline Phosphatase: 118 U/L (ref 38–126)
Anion gap: 16 — ABNORMAL HIGH (ref 5–15)
BUN: 21 mg/dL — ABNORMAL HIGH (ref 6–20)
CO2: 21 mmol/L — ABNORMAL LOW (ref 22–32)
Calcium: 7.7 mg/dL — ABNORMAL LOW (ref 8.9–10.3)
Chloride: 98 mmol/L (ref 98–111)
Creatinine, Ser: 2.75 mg/dL — ABNORMAL HIGH (ref 0.44–1.00)
GFR, Estimated: 20 mL/min — ABNORMAL LOW (ref >60)
Glucose, Bld: 204 mg/dL — ABNORMAL HIGH (ref 70–99)
Potassium: 4.3 mmol/L (ref 3.5–5.1)
Sodium: 135 mmol/L (ref 135–145)
Total Bilirubin: 0.9 mg/dL (ref 0.0–1.2)
Total Protein: 6.4 g/dL — ABNORMAL LOW (ref 6.5–8.1)

## 2024-01-29 LAB — CBC WITH DIFFERENTIAL/PLATELET
Abs Immature Granulocytes: 0 K/uL (ref 0.00–0.07)
Basophils Absolute: 0.3 K/uL — ABNORMAL HIGH (ref 0.0–0.1)
Basophils Relative: 1 %
Eosinophils Absolute: 0.3 K/uL (ref 0.0–0.5)
Eosinophils Relative: 1 %
HCT: 29.4 % — ABNORMAL LOW (ref 36.0–46.0)
Hemoglobin: 9 g/dL — ABNORMAL LOW (ref 12.0–15.0)
Lymphocytes Relative: 19 %
Lymphs Abs: 5.1 K/uL — ABNORMAL HIGH (ref 0.7–4.0)
MCH: 22.6 pg — ABNORMAL LOW (ref 26.0–34.0)
MCHC: 30.6 g/dL (ref 30.0–36.0)
MCV: 73.7 fL — ABNORMAL LOW (ref 80.0–100.0)
Monocytes Absolute: 1.1 K/uL — ABNORMAL HIGH (ref 0.1–1.0)
Monocytes Relative: 4 %
Neutro Abs: 20.3 K/uL — ABNORMAL HIGH (ref 1.7–7.7)
Neutrophils Relative %: 75 %
Platelets: 373 K/uL (ref 150–400)
RBC: 3.99 MIL/uL (ref 3.87–5.11)
RDW: 21.2 % — ABNORMAL HIGH (ref 11.5–15.5)
WBC: 27.1 K/uL — ABNORMAL HIGH (ref 4.0–10.5)
nRBC: 0 % (ref 0.0–0.2)
nRBC: 0 /100{WBCs}

## 2024-01-29 LAB — GLUCOSE, CAPILLARY
Glucose-Capillary: 202 mg/dL — ABNORMAL HIGH (ref 70–99)
Glucose-Capillary: 150 mg/dL — ABNORMAL HIGH (ref 70–99)
Glucose-Capillary: 247 mg/dL — ABNORMAL HIGH (ref 70–99)
Glucose-Capillary: 298 mg/dL — ABNORMAL HIGH (ref 70–99)

## 2024-01-29 LAB — MAGNESIUM: Magnesium: 2.1 mg/dL (ref 1.7–2.4)

## 2024-01-29 MED ORDER — ACETAMINOPHEN 325 MG PO TABS
325.0000 mg | ORAL_TABLET | Freq: Once | ORAL | Status: AC
  Administered 2024-01-29: 325 mg via ORAL
  Filled 2024-01-29: qty 1

## 2024-01-29 MED ORDER — HYDROXYZINE HCL 10 MG PO TABS
10.0000 mg | ORAL_TABLET | Freq: Three times a day (TID) | ORAL | Status: DC
  Administered 2024-01-29 – 2024-02-03 (×10): 10 mg via ORAL
  Filled 2024-01-29 (×11): qty 1

## 2024-01-29 NOTE — Plan of Care (Signed)

## 2024-01-29 NOTE — Progress Notes (Addendum)
 Patient with poor appetite and refusing pro source , [employer] protein shake and juven during morning meds.  Educated how important her nutrition is  for wound healing. Patient reports understanding.  MD aware.

## 2024-01-29 NOTE — Progress Notes (Signed)
 [facility] Progress Note  Subjective:  Seen in room 500 cc UF /w HD yesterday BP's dropped post HD to 91/32, then back up this am  Vitals:   01/28/24 1829 01/28/24 2108 01/29/24 0544 01/29/24 0755  BP: 112/80 (!) 91/32 (!) 144/61 (!) 123/54  Pulse: 92 72 (!) 57 (!) 59  Resp: 17 18  16   Temp: 97.7 F (36.5 C) 98.8 F (37.1 C) 98.4 F (36.9 C) 98.6 F (37 C)  TempSrc:  Oral Oral Oral  SpO2: 100% 99%  100%  Weight: 55 kg     Height:        Exam: Gen no distress, alert , on RA No jvd or bruits Chest clear bilat to bases RRR no MRG Abd soft ntnd no mass or ascites +bs MS L BKA Ext trace pretib edema Neuro , O x 3, deconditioned    RIJ TDC    Home bp meds: Norvasc  5 every day Catapres  patch 0.3 weekly Metoprolol  50 bid    OP HD: G-O TTS 3h   B350   60.3 kg   TDC   Heparin  2000 Last HD yest, came off 58.5kg Missed HD 7/19, o/w good compliance       Assessment/ Plan: Sacral osteomyelitis: Per CT, gen surg consulted- for now cont IV abx. WBC up and down. Per pmd.  ESRD: on HD TTS. Next HD 7/29.  HTN: BP's wnl 140/60 range. Getting clonidine  0.3 patch\, home metoprolol  and norvasc  on hold.   Volume: mild RLE edema, no resp issues. 5kg under today, lower dry wt at dc.  Anemia of esrd: Hb 9-11 here, follow.      [name] Fret MD  [facility] 01/29/2024, 2:03 PM  Recent Labs  Lab 01/28/24 0803 01/29/24 0716  HGB 8.4* 9.0*  ALBUMIN  1.5* 1.6*  CALCIUM  7.5* 7.7*  CREATININE 3.62* 2.75*  K 3.7 4.3   No results for input(s): IRON , TIBC, FERRITIN in the last 168 hours. Inpatient medications:  (feeding supplement) PROSource Plus  30 mL Oral TID BM   apixaban   5 mg Oral BID   ascorbic acid   1,000 mg Oral Daily   atorvastatin   20 mg Oral Daily   calcitRIOL   0.25 mcg Oral Q T,Th,Sa-HD   Chlorhexidine  Gluconate Cloth  6 each Topical Q0600   cloNIDine   0.2 mg Transdermal Weekly   collagenase    Topical Daily   feeding supplement (KATE FARMS  STANDARD 1.4)  325 mL Oral BID BM   gabapentin   100 mg Oral TID   hydrOXYzine   10 mg Oral TID   insulin  aspart  0-5 Units Subcutaneous QHS   insulin  aspart  0-6 Units Subcutaneous TID WC   lidocaine   1 patch Transdermal See admin instructions   multivitamin  1 tablet Oral Q2000   nutrition supplement (JUVEN)  1 packet Oral BID BM   pantoprazole   40 mg Oral Daily   saccharomyces boulardii  250 mg Oral BID   zinc  sulfate (50mg  elemental zinc )  220 mg Oral Daily    piperacillin -tazobactam (ZOSYN )  IV 2.25 g (01/29/24 0508)   vancomycin  Stopped (01/28/24 2018)   docusate sodium , HYDROmorphone  (DILAUDID ) injection, ondansetron  (ZOFRAN ) IV, oxyCODONE 

## 2024-01-29 NOTE — Progress Notes (Signed)
 [facility] Progress Note Patient: [name] [name] FMW:[id number] DOB: 1965/09/12 DOA: 01/24/2024  DOS: the patient was seen and examined on 01/29/2024  Brief Hospital Course: Patient with PMH of ESRD on HD TTS, anemia of CKD, type II DM, neuropathy, left eye blindness, HTN, GERD, lumbar radiculopathy, left BKA, CVA presented to the hospital with complaints of vaginal bleeding. Found to have sacral osteomyelitis with concern for bleeding coming from that site. Currently receiving antibiotics.  Assessment and Plan: Sacral osteomyelitis. CT scan of the abdomen and pelvis shows evidence of sacral osteomyelitis, some gas. General surgery was also consulted. Recommends no intervention or indication for I&D. For now we will continue with IV antibiotic. Unfortunately no blood cultures performed at the time of the admission.  Repeat culture performed on 7/24.  Will monitor.  So far no growth. Consult ID tomorrow.  Proctocolitis. Severe constipation. Constipation resolved. Continue current antibiotic Monitor  Vaginal bleeding. Seen by GYN outpatient.  No vaginal bleeding. Continue Eliquis  for now. So far no bleeding reported. H&H relatively stable as well.  Possible cystitis. Receiving IV antibiotics Outpatient follow-up.  ESRD on HD. TTS schedule. Appreciate nephrology consultation.  Type II DM with peripheral neuropathy.  With long-term insulin  use. Last hemoglobin A1c 7.1. Continue sliding scale insulin . Continue gabapentin .  Anemia of CKD as well as chronic disease in the setting of osteomyelitis. H&H relatively stable.  For now we will monitor. Also reports that she has vaginal bleeding which most likely bleeding from the sacral ulcer. For now we will continue to monitor H&H.  Intractable nausea and vomiting. Resolved.  Poor p.o. intake. Patient with ongoing poor p.o. intake including during last admission. Most likely resulting in her debility and sacral  ulcer. Continue nutritional supplementation. Discussed and offered appetite stimulant medication although patient currently wants to hold off on that.   Subjective: No nausea no vomiting.  Pain well-controlled.  Minimal oral intake.  Physical Exam: Clear to auscultation S1-S2 present Bowel sound present No edema. Left BKA stump.  Data Reviewed: I have Reviewed nursing notes, Vitals, and Lab results. Since last encounter, pertinent lab results CBC and BMP   . I have ordered test including CBC and BMP  .   Disposition: Status is: Inpatient Remains inpatient appropriate because: Monitor for improvement in infection apixaban  (ELIQUIS ) tablet 5 mg   Family Communication: No one at bedside Level of care: Med-Surg   Vitals:   01/28/24 2108 01/29/24 0544 01/29/24 0755 01/29/24 1533  BP: (!) 91/32 (!) 144/61 (!) 123/54 (!) 121/56  Pulse: 72 (!) 57 (!) 59 67  Resp: 18  16 16   Temp: 98.8 F (37.1 C) 98.4 F (36.9 C) 98.6 F (37 C) 99.8 F (37.7 C)  TempSrc: Oral Oral Oral Oral  SpO2: 99%  100% 99%  Weight:      Height:         Author: Yetta Blanch, MD 01/29/2024 7:45 PM  Please look on [email or web address] to find out who is on call.

## 2024-01-30 DIAGNOSIS — L89153 Pressure ulcer of sacral region, stage 3: Secondary | ICD-10-CM

## 2024-01-30 LAB — CBC WITH DIFFERENTIAL/PLATELET
Abs Immature Granulocytes: 0 K/uL (ref 0.00–0.07)
Basophils Absolute: 0.2 K/uL — ABNORMAL HIGH (ref 0.0–0.1)
Basophils Relative: 1 %
Eosinophils Absolute: 0.2 K/uL (ref 0.0–0.5)
Eosinophils Relative: 1 %
HCT: 29.3 % — ABNORMAL LOW (ref 36.0–46.0)
Hemoglobin: 9.1 g/dL — ABNORMAL LOW (ref 12.0–15.0)
Lymphocytes Relative: 30 %
Lymphs Abs: 6.1 K/uL — ABNORMAL HIGH (ref 0.7–4.0)
MCH: 22.8 pg — ABNORMAL LOW (ref 26.0–34.0)
MCHC: 31.1 g/dL (ref 30.0–36.0)
MCV: 73.3 fL — ABNORMAL LOW (ref 80.0–100.0)
Monocytes Absolute: 1.4 K/uL — ABNORMAL HIGH (ref 0.1–1.0)
Monocytes Relative: 7 %
Neutro Abs: 12.3 K/uL — ABNORMAL HIGH (ref 1.7–7.7)
Neutrophils Relative %: 61 %
Platelets: 450 K/uL — ABNORMAL HIGH (ref 150–400)
RBC: 4 MIL/uL (ref 3.87–5.11)
RDW: 21.4 % — ABNORMAL HIGH (ref 11.5–15.5)
WBC: 20.2 K/uL — ABNORMAL HIGH (ref 4.0–10.5)
nRBC: 0 % (ref 0.0–0.2)
nRBC: 0 /100{WBCs}

## 2024-01-30 LAB — COMPREHENSIVE METABOLIC PANEL WITH GFR
ALT: 10 U/L (ref 0–44)
AST: 18 U/L (ref 15–41)
Albumin: 1.6 g/dL — ABNORMAL LOW (ref 3.5–5.0)
Alkaline Phosphatase: 115 U/L (ref 38–126)
Anion gap: 15 (ref 5–15)
BUN: 43 mg/dL — ABNORMAL HIGH (ref 6–20)
CO2: 23 mmol/L (ref 22–32)
Calcium: 7.6 mg/dL — ABNORMAL LOW (ref 8.9–10.3)
Chloride: 96 mmol/L — ABNORMAL LOW (ref 98–111)
Creatinine, Ser: 3.62 mg/dL — ABNORMAL HIGH (ref 0.44–1.00)
GFR, Estimated: 14 mL/min — ABNORMAL LOW (ref >60)
Glucose, Bld: 288 mg/dL — ABNORMAL HIGH (ref 70–99)
Potassium: 4.2 mmol/L (ref 3.5–5.1)
Sodium: 134 mmol/L — ABNORMAL LOW (ref 135–145)
Total Bilirubin: 0.4 mg/dL (ref 0.0–1.2)
Total Protein: 6.2 g/dL — ABNORMAL LOW (ref 6.5–8.1)

## 2024-01-30 LAB — GLUCOSE, CAPILLARY
Glucose-Capillary: 274 mg/dL — ABNORMAL HIGH (ref 70–99)
Glucose-Capillary: 218 mg/dL — ABNORMAL HIGH (ref 70–99)
Glucose-Capillary: 76 mg/dL (ref 70–99)
Glucose-Capillary: 51 mg/dL — ABNORMAL LOW (ref 70–99)
Glucose-Capillary: 77 mg/dL (ref 70–99)

## 2024-01-30 LAB — MAGNESIUM: Magnesium: 2 mg/dL (ref 1.7–2.4)

## 2024-01-30 MED ORDER — INSULIN ASPART 100 UNIT/ML IJ SOLN
0.0000 [IU] | Freq: Three times a day (TID) | INTRAMUSCULAR | Status: DC
  Administered 2024-01-30: 8 [IU] via SUBCUTANEOUS
  Administered 2024-01-30: 5 [IU] via SUBCUTANEOUS
  Administered 2024-01-31: 3 [IU] via SUBCUTANEOUS
  Administered 2024-01-31: 15 [IU] via SUBCUTANEOUS
  Administered 2024-02-01 (×2): 5 [IU] via SUBCUTANEOUS

## 2024-01-30 MED ORDER — DOXYCYCLINE HYCLATE 100 MG PO TABS
100.0000 mg | ORAL_TABLET | Freq: Two times a day (BID) | ORAL | Status: DC
  Administered 2024-01-30 – 2024-02-03 (×9): 100 mg via ORAL
  Filled 2024-01-30 (×9): qty 1

## 2024-01-30 MED ORDER — INSULIN ASPART 100 UNIT/ML IJ SOLN
3.0000 [IU] | Freq: Three times a day (TID) | INTRAMUSCULAR | Status: DC
  Administered 2024-01-30 – 2024-02-03 (×7): 3 [IU] via SUBCUTANEOUS

## 2024-01-30 MED ORDER — ZINC SULFATE 220 (50 ZN) MG PO CAPS
220.0000 mg | ORAL_CAPSULE | Freq: Every day | ORAL | Status: DC
  Administered 2024-01-31 – 2024-02-03 (×4): 220 mg via ORAL
  Filled 2024-01-30 (×4): qty 1

## 2024-01-30 MED ORDER — INSULIN ASPART 100 UNIT/ML IJ SOLN: 0.0000 [IU] | Freq: Three times a day (TID) | INTRAMUSCULAR | Status: DC

## 2024-01-30 MED ORDER — AMOXICILLIN-POT CLAVULANATE 500-125 MG PO TABS
1.0000 | ORAL_TABLET | Freq: Two times a day (BID) | ORAL | Status: DC
  Administered 2024-01-30 – 2024-02-03 (×9): 1 via ORAL
  Filled 2024-01-30 (×10): qty 1

## 2024-01-30 MED ORDER — CHLORHEXIDINE GLUCONATE CLOTH 2 % EX PADS
6.0000 | MEDICATED_PAD | Freq: Every day | CUTANEOUS | Status: DC
  Administered 2024-01-30: 6 via TOPICAL

## 2024-01-30 MED ORDER — INSULIN ASPART 100 UNIT/ML IJ SOLN: 0.0000 [IU] | Freq: Every day | INTRAMUSCULAR | Status: DC

## 2024-01-30 NOTE — Consult Note (Signed)
 [facility]    Date of Admission:  01/24/2024     Reason for Consult: sacral decub ulcer with ct suggestion sacral om    Referring Provider: Tobie Jericho     Lines:  Old left internal jugular hd catheter  Abx: 7/22-28 vanc/piptazo        Assessment: Sacral decub ulcer chronic  S/p left bka with stump decub ulcer fever Dm2 iHD via left internal jugular catheter  Stroke   Patient is bed bound and has no reversible condition to prevent further decubitus ulcer. In this case high risk for sacral decub associated om recurrence/tx failure especially without flap coverage/stool & urinary diversion  In these cases advise short course abx for sepsis which she appears to have  Bcx negative and so far no other localizing infectious syndrome to explain fever/leukocytosis. Presumed sacral process associated soft tissue infection and possibly OM   If ongoing fever and rising leukocytosis despite negative bcx consider reengaging surgery. The left bka stump doest appear infected but could get imaging of that as well   Otherwise finish 2 weeks abx and she'll need aggressive ongoing outpatient wound care  Plan: Change abx to doxy/augmentin  F/u blood cx repeat Consider imaging left stump as well with mri if worsening leukocytosis/fever Id will follow for another 1-2 days Maintain standard isolation precaution Discussed with [facility]      ------------------------------------------------ Principal Problem:   Sacral osteomyelitis (HCC) Active Problems:   Constipation   ESRD (end stage renal disease) (HCC)   Type 2 diabetes mellitus (HCC)   Proctocolitis    HPI: [name] [name] is a 58 y.o. female esrd on ihd via dialysis catheter, dm2, left eye blindness, recent 5/19-12/06/23 admission left diabetic foot infection s/p bka with hospital course complicated by stroke, depression/[place], bedbound, chronic ulcer, admitted 7/22 for  reported vaginal bleeding but determined to be from sacral decub which imaging ct suggest om  Reviewed chart, discussed with patient Leukocytosis up to 26 Febrile up to 101 on 7/27  Ct as mentioned below possible sacral om; no abscess.  Surgery consulted no surgical indication wound base mostly healthy minimal necrosis and have signed off Wound care seeing  She was started on vanc/piptazo on admission. Fever on vanc/piptazo without other sign of sepsis or hemodynamics disturbance  Blood cx 7/24 negative Bcx repeat 7/28 for fever 7/27  Family History  Problem Relation Age of Onset   Diabetes Father    Heart attack Father    Aneurysm Mother     Social History   Tobacco Use   Smoking status: Never   Smokeless tobacco: Never  Substance Use Topics   Alcohol use: No    Alcohol/week: 0.0 standard drinks of alcohol   Drug use: No    Allergies  Allergen Reactions   Amitriptyline  Other (See Comments), Swelling and Nausea And Vomiting    Hypotension, Mental Status Changes, Confusion - Allergic, per MAR   Ciprofloxacin  Diarrhea, Nausea And Vomiting and Other (See Comments)    Severe vomiting requiring ED visit, IV reglan  and IV fluids- Allergic, per MAR   Ibuprofen  Itching   Aspirin  Itching and Other (See Comments)    Pt endorses getting itchy in the past with aspirin . Tolerated dose 04/06/22.   Na Ferric Gluc Cplx In Sucrose Other (See Comments)   Acetaminophen  Itching, Other (See Comments) and Nausea And Vomiting    Allergic, per MAR   Semaglutide Nausea Only and Other (See  Comments)    Allergic, per MAR   Tramadol  Itching    Review of Systems: ROS All Other ROS was negative, except mentioned above   Past Medical History:  Diagnosis Date   Colitis    Diabetes mellitus    ESRD (end stage renal disease) (HCC)    on HD (M,W,F)   Family history of adverse reaction to anesthesia     my uncle's heart stoped    Fracture of femoral neck, right (HCC) 10/02/2020    GERD (gastroesophageal reflux disease)    Heart murmur    Hypertension    Impaired vision in both eyes 11/19/2015   Mitral regurgitation    Neuropathy in diabetes (HCC)    Syncope and collapse 07/28/2015       Scheduled Meds:  (feeding supplement) PROSource Plus  30 mL Oral TID BM   amoxicillin -clavulanate  1 tablet Oral BID   apixaban   5 mg Oral BID   ascorbic acid   1,000 mg Oral Daily   atorvastatin   20 mg Oral Daily   calcitRIOL   0.25 mcg Oral Q T,Th,Sa-HD   Chlorhexidine  Gluconate Cloth  6 each Topical Q0600   cloNIDine   0.2 mg Transdermal Weekly   collagenase    Topical Daily   doxycycline   100 mg Oral Q12H   feeding supplement (KATE FARMS STANDARD 1.4)  325 mL Oral BID BM   gabapentin   100 mg Oral TID   hydrOXYzine   10 mg Oral TID   insulin  aspart  0-15 Units Subcutaneous TID WC   insulin  aspart  0-5 Units Subcutaneous QHS   insulin  aspart  3 Units Subcutaneous TID WC   lidocaine   1 patch Transdermal See admin instructions   multivitamin  1 tablet Oral Q2000   nutrition supplement (JUVEN)  1 packet Oral BID BM   pantoprazole   40 mg Oral Daily   saccharomyces boulardii  250 mg Oral BID   [START ON 01/31/2024] zinc  sulfate (50mg  elemental zinc )  220 mg Oral Daily   Continuous Infusions: PRN Meds:.docusate sodium , HYDROmorphone  (DILAUDID ) injection, ondansetron  (ZOFRAN ) IV, oxyCODONE    OBJECTIVE: Blood pressure 108/60, pulse (!) 58, temperature 99.3 F (37.4 C), temperature source Oral, resp. rate 16, height 5' (1.524 m), weight 55 kg, SpO2 100%.  Physical Exam  General/constitutional: drowsy, sleepy, a few words conversant, cooperative, appropriate HEENT: Normocephalic, PER, Conj Clear, EOMI, Oropharynx clear Neck supple CV: rrr no mrg Lungs: clear to auscultation, normal respiratory effort Abd: Soft, Nontender Ext: no edema Skin: No Rash Neuro: moving bilateral upper ext without obvious assymetry in strength; full neural exam deferred as patient unable to  maintain alertness MSK: hx left bka; bka stump dressing c/d  Pictures from 7/24 reviewed         Central line presence: left internal jugular hd catheter site no purulence   Lab Results Lab Results  Component Value Date   WBC 20.2 (H) 01/30/2024   HGB 9.1 (L) 01/30/2024   HCT 29.3 (L) 01/30/2024   MCV 73.3 (L) 01/30/2024   PLT 450 (H) 01/30/2024    Lab Results  Component Value Date   CREATININE 3.62 (H) 01/30/2024   BUN 43 (H) 01/30/2024   NA 134 (L) 01/30/2024   K 4.2 01/30/2024   CL 96 (L) 01/30/2024   CO2 23 01/30/2024    Lab Results  Component Value Date   ALT 10 01/30/2024   AST 18 01/30/2024   ALKPHOS 115 01/30/2024   BILITOT 0.4 01/30/2024      Microbiology: Recent Results (  from the past 240 hours)  Culture, blood (Routine X 2) w Reflex to ID Panel     Status: None (Preliminary result)   Collection Time: 01/26/24  6:54 PM   Specimen: BLOOD  Result Value Ref Range Status   Specimen Description BLOOD RIGHT ANTECUBITAL  Final   Special Requests   Final    BOTTLES DRAWN AEROBIC AND ANAEROBIC Blood Culture results may not be optimal due to an inadequate volume of blood received in culture bottles   Culture   Final    NO GROWTH 4 DAYS Performed at [facility], [address], [place], [place] [address]    Report Status PENDING  Incomplete  Culture, blood (Routine X 2) w Reflex to ID Panel     Status: None (Preliminary result)   Collection Time: 01/26/24  7:02 PM   Specimen: BLOOD  Result Value Ref Range Status   Specimen Description BLOOD SITE NOT SPECIFIED  Final   Special Requests   Final    BOTTLES DRAWN AEROBIC ONLY Blood Culture results may not be optimal due to an inadequate volume of blood received in culture bottles   Culture   Final    NO GROWTH 4 DAYS Performed at [facility], [address] [place], [place] [address]    Report Status PENDING  Incomplete     Serology:    Imaging: If present, new imagings (plain  films, ct scans, and mri) have been personally visualized and interpreted; radiology reports have been reviewed. Decision making incorporated into the Impression / Recommendations.  7/22 ct abd pelv noncontrast Interval development of large sacral decubitus ulceration is seen in the left side, with some gas extending into the musculature of the right gluteal region suggesting infection. Some degree of osteomyelitis involving the underlying sacrum cannot be excluded.   Bilateral renal atrophy is noted. Mild bilateral hydroureteronephrosis is noted without obstructing calculus. There remains significant wall thickening involving the anterior and superior aspect of the urinary bladder which may represent cystitis, but neoplasm cannot be excluded.   Large amount of stool is seen involving the distal portions of the colon. Moderate wall thickening of sigmoid colon and rectum is noted concerning for proctocolitis.  7/25 cxr 1. Subsegmental atelectasis in both lung bases. No acute cardiopulmonary abnormality. 2. Similarly positioned left hemodialysis catheter terminating in the right atrium.  Constance ONEIDA Passer, MD [facility] [phone] pager    01/30/2024, 6:05 PM

## 2024-01-30 NOTE — Progress Notes (Signed)
 [facility] Progress Note Patient: [name] [name] FMW:[id number] DOB: January 04, 1966 DOA: 01/24/2024  DOS: the patient was seen and examined on 01/30/2024  Brief Hospital Course: Patient with PMH of ESRD on HD TTS, anemia of CKD, type II DM, neuropathy, left eye blindness, HTN, GERD, lumbar radiculopathy, left BKA, CVA presented to the hospital with complaints of vaginal bleeding. Found to have sacral osteomyelitis with concern for bleeding coming from that site. Currently receiving antibiotics.  ID consulted.  Assessment and Plan: Sacral osteomyelitis. Intermittent fever. CT scan of the abdomen and pelvis shows evidence of sacral osteomyelitis, some gas. General surgery was also consulted. Recommends no intervention or indication for I&D. Treated with IV vancomycin  and Zosyn . Unfortunately no blood cultures performed at the time of the admission.  Repeat culture performed on 7/24.  Will monitor.  So far no growth. ID consulted.  Recommended oral doxycycline  and Augmentin . Repeat blood cultures ordered due to intermittent fever.  Proctocolitis. Severe constipation. Constipation resolved. Continue current antibiotic Monitor  Vaginal bleeding.  Ruled out. Seen by GYN outpatient.  No vaginal bleeding. Continue Eliquis  for now. So far no bleeding reported. H&H relatively stable as well.  Possible cystitis. Receiving IV antibiotics Outpatient follow-up.  ESRD on HD. TTS schedule. Appreciate nephrology consultation.  Type II DM with peripheral neuropathy.  With long-term insulin  use. Last hemoglobin A1c 7.1. Continue sliding scale insulin . Continue gabapentin .  Anemia of CKD as well as chronic disease in the setting of osteomyelitis. H&H relatively stable.  For now we will monitor. Also reports that she has vaginal bleeding which most likely bleeding from the sacral ulcer. For now we will continue to monitor H&H.  Intractable nausea and vomiting. Resolved.  Poor p.o.  intake. Patient with ongoing poor p.o. intake including during last admission. Most likely resulting in her debility and sacral ulcer. Continue nutritional supplementation. Discussed and offered appetite stimulant medication although patient currently wants to hold off on that.   Subjective: Sleepy.  Was able to eat half of her sandwich.  No nausea no vomiting.  Pain well-controlled but still present.  No fever no chills.  Denies any diarrhea.  Had another fever episode last night.  Physical Exam: Clear to auscultation. Bowel sounds present.  Nontender. No edema. Left BKA. Alert awake and oriented x 3 although lethargic.  Data Reviewed: I have Reviewed nursing notes, Vitals, and Lab results. Since last encounter, pertinent lab results CBC and BMP   . I have ordered test including CBC and BMP  . I have discussed pt's care plan and test results with ID  .   Disposition: Status is: Inpatient Remains inpatient appropriate because: Awaiting improvement in fever curve. apixaban  (ELIQUIS ) tablet 5 mg   Family Communication: Discussed with husband on the phone. Level of care: Med-Surg   Vitals:   01/30/24 0012 01/30/24 0504 01/30/24 0754 01/30/24 1426  BP:  (!) 104/56 (!) 118/53 108/60  Pulse:  87 (!) 55 (!) 58  Resp:  17 16 16   Temp: 98.8 F (37.1 C) 98 F (36.7 C) 98.3 F (36.8 C) 99.3 F (37.4 C)  TempSrc: Oral Oral Oral Oral  SpO2:  100% 100% 100%  Weight:      Height:         Author: Yetta Blanch, MD 01/30/2024 6:12 PM  Please look on [email or web address] to find out who is on call.

## 2024-01-30 NOTE — Progress Notes (Signed)
 Nutrition Follow-up  DOCUMENTATION CODES:   Not applicable  INTERVENTION:  Encourage PO intake - Currently on regular diet with 1200 ml fluid restriction  Room service with assist  Continue 1 packet Juven BID to support wound healing, each packet provides 95 calories, 2.5 grams of protein, and 9.8 grams of carbohydrate  Increase PROSource Plus 30 mL to TID, each packet provides 100 kcal and 15 grams of protein Continue supplementation with vitamin C  x 30 days  and zinc  x 15 days fpr wound healing  Continue renal MVI [employer] 1.4 PO BID, each supplement provides 455 kcal and 20 grams protein. Recommend ordering Vitamin labs, A, C, D, E, and Zinc  for deficiency to aide in wound healing   NUTRITION DIAGNOSIS:   Increased nutrient needs related to wound healing as evidenced by estimated needs. - Still applicable   GOAL:   Patient will meet greater than or equal to 90% of their needs - Progressing   MONITOR:   PO intake, Supplement acceptance, Labs, Weight trends, Skin, I & O's  REASON FOR ASSESSMENT:   Consult Wound healing  ASSESSMENT:   58 year old female who presented to the ED on 01/24/24 with concern for vaginal bleeding. PMH of T2DM, ESRD on iHD, GERD, chronic anemia, mitral regurgitation, recent admission for infected diabetic foot ulcer s/p L BKA, L eye blindness, HTN, CVA. Pt admitted with infected large sacral decubitus ulcer with possible sacral osteomyelitis and proctocolitis.   11/25/2023 L BKA   Pt resting at bedside, is soft spoken and cannot see well due to blindness. Pt tired, not readily open to answer questions at this time.  Pt is a picky eater. Per nurse tech pt was getting automatic trays earlier in her admission and was refusing to eat. Nurse tech has been ordering her meals recently and she has been eating well. Discussed with nurse tech and pt about changing to room service with assist. Both in agreement.   Pt was living at [facility] and was  receiving 3 meals per day. Pt reports she was eating all 3 but could not remember what she would eat on a day to day basis. Pt thinks she had a good appetite and was eating well PTA. Pt unsure if she has lost weight, had L BKA 11/25/2023. Unsure if weight history is accurate as 6/03 weight was higher than before her amputation. New dry weight of 61 kg after amputation per nephrology. Now pt with lower dry weight per nephrology which is indicative of weight loss. On exam pt with minimal muscle and fat wasting however, pt with fluid accumulation that may be masking additional wasting.   Pt has tried multiple different supplements during admission and only will drink [employer]. Pt prefers strawberry and chocolate however Mallie [employer]ion on formulary is vanilla. Pt aware. Was refusing Prosource and Juven prior but states she has tried them and will start taking them because they taste good. Pt at high risk of becoming malnourished due to decreased appetite, nonhealing wounds, decreasing dry weight, and increased needs from HD. Encouraged increased PO intake and use of ONS.   Pt reports having nausea and vomiting yesterday, nothing charted. Denies recent weight loss, loss of appeite.   Admit weight: 59.9 kg Current weight: 55 kg EDW: 61 kg - Per nephrology EDW is now lower   Average Meal Intake: 7/24: 30% x 2 meals 7/25: 20% x 1 meal 7/26: 10% x 1 meal 7/27: 75% x 1 meal 7/28: 30% x 1 meal  Intake/Output Summary (Last 24 hours) at 01/30/2024 1358 Last data filed at 01/30/2024 1000 Gross per 24 hour  Intake 1370 ml  Output --  Net 1370 ml     Nutritionally Relevant Medications: Scheduled Meds:  (feeding supplement) PROSource Plus  30 mL Oral TID BM   ascorbic acid   1,000 mg Oral Daily   feeding supplement (KATE FARMS STANDARD 1.4)  325 mL Oral BID BM   gabapentin   100 mg Oral TID   hydrOXYzine   10 mg Oral TID   insulin  aspart  0-15 Units Subcutaneous TID WC   insulin  aspart  0-5 Units  Subcutaneous QHS   insulin  aspart  3 Units Subcutaneous TID WC   lidocaine   1 patch Transdermal See admin instructions   multivitamin  1 tablet Oral Q2000   nutrition supplement (JUVEN)  1 packet Oral BID BM   pantoprazole   40 mg Oral Daily   saccharomyces boulardii  250 mg Oral BID   zinc  sulfate (50mg  elemental zinc )  220 mg Oral Daily   Continuous Infusions:  piperacillin -tazobactam (ZOSYN )  IV 2.25 g (01/30/24 0548)   vancomycin  Stopped (01/28/24 2018)    Labs Reviewed: Sodium 134 BUN 43 Creatinine 3.62 Calcium  7.6  Albumin  1.6 Total protein 6.2 GFR 14 CBG ranges from 150-298 mg/dL over the last 24 hours HgbA1c 7.1   NUTRITION - FOCUSED PHYSICAL EXAM:  Flowsheet Row Most Recent Value  Orbital Region No depletion  Upper Arm Region Mild depletion  Thoracic and Lumbar Region No depletion  Buccal Region No depletion  Temple Region No depletion  Clavicle Bone Region Mild depletion  Clavicle and Acromion Bone Region Mild depletion  Scapular Bone Region Mild depletion  Dorsal Hand Mild depletion  Patellar Region Unable to assess  [Fluid]  Anterior Thigh Region Unable to assess  [Fluid]  Posterior Calf Region Unable to assess  [Fluid]  Edema (RD Assessment) Moderate  Hair Reviewed  Eyes Reviewed  [Blind]  Mouth Reviewed  [Partial teeth]  Skin Reviewed  Nails Reviewed    Diet Order:   Diet Order             Diet regular Room service appropriate? Yes with Assist; Fluid consistency: Thin; Fluid restriction: 1200 mL Fluid  Diet effective now                   EDUCATION NEEDS:   Not appropriate for education at this time  Skin:  Skin Assessment: Skin Integrity Issues: Skin Integrity Issues:: Stage III, Other (Comment) Stage III: Sacrum Other: L knee infection  Last BM:  01/27/24 multiple large type 5  Height:   Ht Readings from Last 1 Encounters:  01/24/24 5' (1.524 m)    Weight:   Wt Readings from Last 1 Encounters:  01/28/24 55 kg    Ideal  Body Weight:  45.5 kg  BMI:  Body mass index is 23.68 kg/m.  Estimated Nutritional Needs:   Kcal:  1800-2000 Kcal  Protein:  85-105 gm  Fluid:  1L + UOP   Olivia Kenning, RD Registered Dietitian  See Amion for more information

## 2024-01-30 NOTE — Progress Notes (Signed)
 [facility] Progress Note   Subjective:    Seen and examined patient at bedside. Denies SOB, CP, and N/V. Next HD 7/29.  Objective Vitals:   01/29/24 2142 01/30/24 0012 01/30/24 0504 01/30/24 0754  BP: (!) 106/57  (!) 104/56 (!) 118/53  Pulse: 70  87 (!) 55  Resp: 18  17 16   Temp: (!) 101.2 F (38.4 C) 98.8 F (37.1 C) 98 F (36.7 C) 98.3 F (36.8 C)  TempSrc: Oral Oral Oral Oral  SpO2: 98%  100% 100%  Weight:      Height:       Physical Exam General: Awake, alert, NAD, on RA Heart: S1 and S2; No murmurs, gallops, or rubs Lungs: Clear anteriorly Abdomen: Soft and non-tender Extremities: No LE edema Dialysis Access: [facility]   Filed Weights   01/28/24 1526 01/28/24 1535 01/28/24 1829  Weight: 55.5 kg 55.5 kg 55 kg    Intake/Output Summary (Last 24 hours) at 01/30/2024 0914 Last data filed at 01/30/2024 0549 Gross per 24 hour  Intake 1250 ml  Output --  Net 1250 ml    Additional Objective Labs: Basic Metabolic Panel: Recent Labs  Lab 01/28/24 0803 01/29/24 0716 01/30/24 0742  NA 131* 135 134*  K 3.7 4.3 4.2  CL 97* 98 96*  CO2 22 21* 23  GLUCOSE 167* 204* 288*  BUN 24* 21* 43*  CREATININE 3.62* 2.75* 3.62*  CALCIUM  7.5* 7.7* 7.6*   Liver Function Tests: Recent Labs  Lab 01/28/24 0803 01/29/24 0716 01/30/24 0742  AST 16 20 18   ALT 8 7 10   ALKPHOS 112 118 115  BILITOT 1.1 0.9 0.4  PROT 6.0* 6.4* 6.2*  ALBUMIN  1.5* 1.6* 1.6*   No results for input(s): LIPASE, AMYLASE in the last 168 hours. CBC: Recent Labs  Lab 01/26/24 0841 01/27/24 2144 01/28/24 0803 01/29/24 0716 01/30/24 0742  WBC 19.4* 26.1* 22.1* 27.1* 20.2*  NEUTROABS 13.5*  --  16.6* 20.3* PENDING  HGB 9.2* 8.3* 8.4* 9.0* 9.1*  HCT 30.3* 27.2* 26.9* 29.4* 29.3*  MCV 76.3* 75.6* 74.3* 73.7* 73.3*  PLT 364 388 377 373 450*   Blood Culture    Component Value Date/Time   SDES BLOOD SITE NOT SPECIFIED 01/26/2024 1902   SPECREQUEST  01/26/2024 1902    BOTTLES  DRAWN AEROBIC ONLY Blood Culture results may not be optimal due to an inadequate volume of blood received in culture bottles   CULT  01/26/2024 1902    NO GROWTH 4 DAYS Performed at [facility], [address], [place], [place] [address]    REPTSTATUS PENDING 01/26/2024 1902    Cardiac Enzymes: No results for input(s): CKTOTAL, CKMB, CKMBINDEX, TROPONINI in the last 168 hours. CBG: Recent Labs  Lab 01/29/24 0827 01/29/24 1152 01/29/24 1654 01/29/24 2140 01/30/24 0754  GLUCAP 202* 150* 247* 298* 274*   Iron  Studies: No results for input(s): IRON , TIBC, TRANSFERRIN, FERRITIN in the last 72 hours. Lab Results  Component Value Date   INR 1.1 03/08/2020   INR 1.1 03/07/2020   INR 1.0 10/10/2014   Studies/Results: No results found.  Medications:  piperacillin -tazobactam (ZOSYN )  IV 2.25 g (01/30/24 0548)   vancomycin  Stopped (01/28/24 2018)    (feeding supplement) PROSource Plus  30 mL Oral TID BM   apixaban   5 mg Oral BID   ascorbic acid   1,000 mg Oral Daily   atorvastatin   20 mg Oral Daily   calcitRIOL   0.25 mcg Oral Q T,Th,Sa-HD   Chlorhexidine   Gluconate Cloth  6 each Topical Q0600   cloNIDine   0.2 mg Transdermal Weekly   collagenase    Topical Daily   feeding supplement (KATE FARMS STANDARD 1.4)  325 mL Oral BID BM   gabapentin   100 mg Oral TID   hydrOXYzine   10 mg Oral TID   insulin  aspart  0-15 Units Subcutaneous TID WC   insulin  aspart  0-5 Units Subcutaneous QHS   insulin  aspart  3 Units Subcutaneous TID WC   lidocaine   1 patch Transdermal See admin instructions   multivitamin  1 tablet Oral Q2000   nutrition supplement (JUVEN)  1 packet Oral BID BM   pantoprazole   40 mg Oral Daily   saccharomyces boulardii  250 mg Oral BID   zinc  sulfate (50mg  elemental zinc )  220 mg Oral Daily    Dialysis Orders: [facility]-O TTS 3h   B350   60.3 kg   TDC   Heparin  2000 Last HD yest, came off 58.5kg Missed HD 7/19, o/w good compliance  Home bp  meds: Norvasc  5 every day Catapres  patch 0.3 weekly Metoprolol  50 bid  Assessment/Plan: Sacral osteomyelitis: Per CT, gen surg consulted- for now cont IV abx. WBC up and down. Per pmd.  ESRD: on HD TTS. Next HD 7/29.  HTN: BP's appear soft/stable. Getting clonidine  0.3 patch\, home metoprolol  and norvasc  on hold.   Volume: didn't appreciate LE edema, no resp issues. 5kg under today, lower dry wt at dc.  Anemia of esrd: Hb 9-11 here, follow.   [name] Piety, NP [facility] 01/30/2024,9:14 AM  LOS: 6 days

## 2024-01-30 NOTE — Progress Notes (Signed)
 pt [name] [name] is 274. Jerre Blanch, MD aware that on pt MAR her insulin  is only scheduled for 12 and 5pm

## 2024-01-30 NOTE — TOC Progression Note (Signed)
 Transition of Care [facility]) - Progression Note    Patient Details  Name: [name] [name] MRN: [id number] Date of Birth: [birth year]  [facility] CM/SW Contact  Zyiah Withington LITTIE Moose, LCSW Phone Number: 01/30/2024, 9:50 AM  Clinical Narrative:    Pt admitted from [facility] LTC. Fl2 completed 7/25. CSW continuing to follow.                     Expected Discharge Plan and Services                                               Social Drivers of Health (SDOH) Interventions SDOH Screenings   Food Insecurity: No Food Insecurity (01/25/2024)  Housing: Unknown (01/25/2024)  Transportation Needs: No Transportation Needs (01/25/2024)  Utilities: Not At Risk (01/25/2024)  Social Connections: Unknown (11/15/2021)   Received from [facility]  Tobacco Use: Low Risk  (01/27/2024)    Readmission Risk Interventions     No data to display

## 2024-01-30 NOTE — Plan of Care (Signed)
  Problem: Coping: Goal: Ability to adjust to condition or change in health will improve Outcome: Not Progressing   Problem: Health Behavior/Discharge Planning: Goal: Ability to manage health-related needs will improve Outcome: Not Progressing   Problem: Nutritional: Goal: Maintenance of adequate nutrition will improve Outcome: Not Progressing   Problem: Tissue Perfusion: Goal: Adequacy of tissue perfusion will improve Outcome: Not Progressing   Problem: Health Behavior/Discharge Planning: Goal: Ability to manage health-related needs will improve Outcome: Not Progressing   Problem: Clinical Measurements: Goal: Will remain free from infection Outcome: Not Progressing   Problem: Coping: Goal: Level of anxiety will decrease Outcome: Not Progressing

## 2024-01-31 DIAGNOSIS — M4628 Osteomyelitis of vertebra, sacral and sacrococcygeal region: Secondary | ICD-10-CM | POA: Diagnosis not present

## 2024-01-31 LAB — CULTURE, BLOOD (ROUTINE X 2)
Culture: NO GROWTH
Culture: NO GROWTH

## 2024-01-31 LAB — GLUCOSE, CAPILLARY
Glucose-Capillary: 360 mg/dL — ABNORMAL HIGH (ref 70–99)
Glucose-Capillary: 152 mg/dL — ABNORMAL HIGH (ref 70–99)
Glucose-Capillary: 64 mg/dL — ABNORMAL LOW (ref 70–99)
Glucose-Capillary: 71 mg/dL (ref 70–99)

## 2024-01-31 LAB — RENAL FUNCTION PANEL
Albumin: 1.6 g/dL — ABNORMAL LOW (ref 3.5–5.0)
Anion gap: 14 (ref 5–15)
BUN: 53 mg/dL — ABNORMAL HIGH (ref 6–20)
CO2: 21 mmol/L — ABNORMAL LOW (ref 22–32)
Calcium: 7.6 mg/dL — ABNORMAL LOW (ref 8.9–10.3)
Chloride: 95 mmol/L — ABNORMAL LOW (ref 98–111)
Creatinine, Ser: 3.91 mg/dL — ABNORMAL HIGH (ref 0.44–1.00)
GFR, Estimated: 13 mL/min — ABNORMAL LOW (ref >60)
Glucose, Bld: 343 mg/dL — ABNORMAL HIGH (ref 70–99)
Phosphorus: 3.4 mg/dL (ref 2.5–4.6)
Potassium: 4.7 mmol/L (ref 3.5–5.1)
Sodium: 130 mmol/L — ABNORMAL LOW (ref 135–145)

## 2024-01-31 LAB — VITAMIN D 25 HYDROXY (VIT D DEFICIENCY, FRACTURES): Vit D, 25-Hydroxy: 25.85 ng/mL — ABNORMAL LOW (ref 30–100)

## 2024-01-31 MED ORDER — HEPARIN SODIUM (PORCINE) 1000 UNIT/ML IJ SOLN
INTRAMUSCULAR | Status: AC
  Filled 2024-01-31: qty 4

## 2024-01-31 MED ORDER — CHLORHEXIDINE GLUCONATE CLOTH 2 % EX PADS
6.0000 | MEDICATED_PAD | Freq: Every day | CUTANEOUS | Status: DC
  Administered 2024-01-31 – 2024-02-01 (×2): 6 via TOPICAL

## 2024-01-31 MED ORDER — HEPARIN SODIUM (PORCINE) 1000 UNIT/ML IJ SOLN
3800.0000 [IU] | Freq: Once | INTRAMUSCULAR | Status: AC
  Administered 2024-02-02: 3800 [IU]

## 2024-01-31 NOTE — Progress Notes (Signed)
 [facility] Progress Note Patient: [name] [name] FMW:[id number] DOB: 1965/11/12 DOA: 01/24/2024  DOS: the patient was seen and examined on 01/31/2024  Brief Hospital Course: Patient with PMH of ESRD on HD TTS, anemia of CKD, type II DM, neuropathy, left eye blindness, HTN, GERD, lumbar radiculopathy, left BKA, CVA presented to the hospital with complaints of vaginal bleeding. Found to have sacral osteomyelitis with concern for bleeding coming from that site. Currently receiving antibiotics.  ID consulted. Assessment and Plan: Sacral osteomyelitis. Intermittent fever. CT scan of the abdomen and pelvis shows evidence of sacral osteomyelitis, some gas. General surgery was also consulted. Recommends no intervention or indication for I&D. Treated with IV vancomycin  and Zosyn . Unfortunately no blood cultures performed at the time of the admission. Repeat culture performed on 7/24.  Will monitor.  So far no growth. ID consulted.  Recommended oral doxycycline  and Augmentin . Repeat blood cultures ordered due to intermittent fever.  Proctocolitis. Severe constipation. Constipation resolved. Continue current antibiotic Monitor  Vaginal bleeding.  Ruled out. Seen by GYN outpatient.  No vaginal bleeding. Continue Eliquis  for now. So far no bleeding reported. H&H relatively stable as well.  Possible cystitis. Receiving IV antibiotics Outpatient follow-up.  ESRD on HD. TTS schedule. Appreciate nephrology consultation.  Type II DM with peripheral neuropathy.  With long-term insulin  use. Last hemoglobin A1c 7.1. Continue sliding scale insulin . Continue gabapentin .  Anemia of CKD as well as chronic disease in the setting of osteomyelitis. H&H relatively stable.  For now we will monitor. Also reports that she has vaginal bleeding which most likely bleeding from the sacral ulcer. For now we will continue to monitor H&H.  Intractable nausea and vomiting. Resolved.  Poor p.o.  intake. Patient with ongoing poor p.o. intake including during last admission. Most likely resulting in her debility and sacral ulcer. Continue nutritional supplementation. Discussed and offered appetite stimulant medication although patient currently wants to hold off on that.   Subjective: Fever curve improving.  No nausea no vomiting no fever no chills.  No chest pain.  Abdominal pain as well as back pain improving.  Oral intake still remains low.  Physical Exam: Clear to auscultation. S1-S2 present Bowel sound present.  Nontender.  Data Reviewed: I have Reviewed nursing notes, Vitals, and Lab results. Since last encounter, pertinent lab results CBC and BMP   . I have ordered test including CBC and BMP  .  Discussed with ID  Disposition: Status is: Inpatient Remains inpatient appropriate because:  apixaban  (ELIQUIS ) tablet 5 mg   Family Communication: no one at bedside, d/w husband prescriber7/28 Level of care: Med-Surg speak to pharmacy Vitals:   01/31/24 1230 01/31/24 1236 01/31/24 1237 01/31/24 1422  BP: (!) 144/70  (!) 164/62 (!) 179/70  Pulse: 66 65 63 63  Resp: 10 16 11 17   Temp:   98 F (36.7 C) 97.7 F (36.5 C)  TempSrc:      SpO2: 100% 100% 100% 100%  Weight:      Height:         Author: Yetta Blanch, MD 01/31/2024 4:34 PM  Please look on [email or web address] to find out who is on call.

## 2024-01-31 NOTE — Progress Notes (Signed)
 Pt transported to dialysis via bed by transportation staff.

## 2024-01-31 NOTE — Inpatient Diabetes Management (Signed)
 Inpatient Diabetes Program Recommendations  AACE/ADA: New Consensus Statement on Inpatient Glycemic Control   Target Ranges:  Prepandial:   less than 140 mg/dL      Peak postprandial:   less than 180 mg/dL (1-2 hours)      Critically ill patients:  140 - 180 mg/dL    Latest Reference Range & Units 01/30/24 07:54 01/30/24 11:26 01/30/24 17:55 01/30/24 20:59 01/30/24 21:41 01/31/24 08:06  Glucose-Capillary 70 - 99 mg/dL 725 (H) 781 (H) 76 51 (L) 77 360 (H)    Review of Glycemic Control  Diabetes history: DM2 Outpatient Diabetes medications: None Current orders for Inpatient glycemic control: Novolog  0-15 units TID with meals, Novolog  0-5 units at bedtime, Novolog  3 units TID with meals  Inpatient Diabetes Program Recommendations:    Insulin : CBG down to 51 mg/dl at 79:40 on 2/71 (likely due to Novolog  correction).  CBG 360 mg/dl at 1:93 am today.  Please consider ordering Semglee  5 units Q24H and decrease Novolog  correction to 0-9 units TID with meals.  Diet: Currently ordered regular diet. May want to consider changing diet to Carb Modified Renal diet.  Thanks, Earnie Gainer, RN, MSN, CDCES Diabetes Coordinator Inpatient Diabetes Program [phone] (Team Pager from 8am to 5pm)

## 2024-01-31 NOTE — Progress Notes (Signed)
  [facility] Progress Note   Subjective:    Seen and examined patient on the HD unit. Nursing is preparing to start her treatment. UFG set 1-1.5L. She appears more awake this morning. She denies SOB, CP, and N/V. She reports being hungry.  Objective Vitals:   01/30/24 1937 01/30/24 1938 01/31/24 0500 01/31/24 0807  BP: (!) 114/53 (!) 114/53 (!) 154/66 (!) 176/75  Pulse: 64 64 (!) 54 (!) 55  Resp: 18 18 17 17   Temp: 97.9 F (36.6 C) 97.9 F (36.6 C) (!) 97.5 F (36.4 C) (!) 97.5 F (36.4 C)  TempSrc: Oral Oral Oral   SpO2: 96% 96% 100% 100%  Weight:      Height:       Physical Exam General: Awake, alert, NAD, on RA Heart: S1 and S2; No murmurs, gallops, or rubs Lungs: Clear anteriorly Abdomen: Soft and non-tender Extremities: No LE edema Dialysis Access: [facility]   Filed Weights   01/28/24 1526 01/28/24 1535 01/28/24 1829  Weight: 55.5 kg 55.5 kg 55 kg    Intake/Output Summary (Last 24 hours) at 01/31/2024 0924 Last data filed at 01/30/2024 1000 Gross per 24 hour  Intake 120 ml  Output --  Net 120 ml    Additional Objective Labs: Basic Metabolic Panel: Recent Labs  Lab 01/29/24 0716 01/30/24 0742 01/31/24 0656  NA 135 134* 130*  K 4.3 4.2 4.7  CL 98 96* 95*  CO2 21* 23 21*  GLUCOSE 204* 288* 343*  BUN 21* 43* 53*  CREATININE 2.75* 3.62* 3.91*  CALCIUM  7.7* 7.6* 7.6*  PHOS  --   --  3.4   Liver Function Tests: Recent Labs  Lab 01/28/24 0803 01/29/24 0716 01/30/24 0742 01/31/24 0656  AST 16 20 18   --   ALT 8 7 10   --   ALKPHOS 112 118 115  --   BILITOT 1.1 0.9 0.4  --   PROT 6.0* 6.4* 6.2*  --   ALBUMIN  1.5* 1.6* 1.6* 1.6*   No results for input(s): LIPASE, AMYLASE in the last 168 hours. CBC: Recent Labs  Lab 01/26/24 0841 01/27/24 2144 01/28/24 0803 01/29/24 0716 01/30/24 0742  WBC 19.4* 26.1* 22.1* 27.1* 20.2*  NEUTROABS 13.5*  --  16.6* 20.3* 12.3*  HGB 9.2* 8.3* 8.4* 9.0* 9.1*  HCT 30.3* 27.2* 26.9* 29.4* 29.3*   MCV 76.3* 75.6* 74.3* 73.7* 73.3*  PLT 364 388 377 373 450*   Blood Culture    Component Value Date/Time   SDES BLOOD LEFT HAND 01/30/2024 1011   SPECREQUEST  01/30/2024 1011    BOTTLES DRAWN AEROBIC ONLY Blood Culture adequate volume   CULT  01/30/2024 1011    NO GROWTH < 24 HOURS Performed at [facility], [address] [place], [place] [address]    REPTSTATUS PENDING 01/30/2024 1011    Cardiac Enzymes: No results for input(s): CKTOTAL, CKMB, CKMBINDEX, TROPONINI in the last 168 hours. CBG: Recent Labs  Lab 01/30/24 1126 01/30/24 1755 01/30/24 2059 01/30/24 2141 01/31/24 0806  GLUCAP 218* 76 51* 77 360*   Iron  Studies: No results for input(s): IRON , TIBC, TRANSFERRIN, FERRITIN in the last 72 hours. Lab Results  Component Value Date   INR 1.1 03/08/2020   INR 1.1 03/07/2020   INR 1.0 10/10/2014   Studies/Results: No results found.  Medications:   (feeding supplement) PROSource Plus  30 mL Oral TID BM   amoxicillin -clavulanate  1 tablet Oral BID   apixaban   5 mg Oral BID  ascorbic acid   1,000 mg Oral Daily   atorvastatin   20 mg Oral Daily   calcitRIOL   0.25 mcg Oral Q T,Th,Sa-HD   Chlorhexidine  Gluconate Cloth  6 each Topical Daily   cloNIDine   0.2 mg Transdermal Weekly   collagenase    Topical Daily   doxycycline   100 mg Oral Q12H   feeding supplement (KATE FARMS STANDARD 1.4)  325 mL Oral BID BM   gabapentin   100 mg Oral TID   hydrOXYzine   10 mg Oral TID   insulin  aspart  0-15 Units Subcutaneous TID WC   insulin  aspart  0-5 Units Subcutaneous QHS   insulin  aspart  3 Units Subcutaneous TID WC   lidocaine   1 patch Transdermal See admin instructions   multivitamin  1 tablet Oral Q2000   nutrition supplement (JUVEN)  1 packet Oral BID BM   pantoprazole   40 mg Oral Daily   saccharomyces boulardii  250 mg Oral BID   zinc  sulfate (50mg  elemental zinc )  220 mg Oral Daily    Dialysis Orders: G-O TTS 3h   B350   60.3 kg   TDC    Heparin  2000 Last HD yest, came off 58.5kg Missed HD 7/19, o/w good compliance   Home bp meds: Norvasc  5 every day Catapres  patch 0.3 weekly Metoprolol  50 bid  Assessment/Plan: Sacral osteomyelitis: Per CT, gen surg consulted- for now cont IV abx. WBC up and down. Per pmd.  ESRD: on HD TTS. On HD.  HTN: BP's appear soft/stable. Getting clonidine  0.3 patch\, home metoprolol  and norvasc  on hold.   Volume: didn't appreciate LE edema, no resp issues. 5kg under today, lower dry wt at dc.  Anemia of esrd: Hb 9-11 here, follow.   [name] Piety, NP [facility] 01/31/2024,9:24 AM  LOS: 7 days

## 2024-02-01 ENCOUNTER — Ambulatory Visit: Admitting: Family

## 2024-02-01 DIAGNOSIS — M4628 Osteomyelitis of vertebra, sacral and sacrococcygeal region: Secondary | ICD-10-CM | POA: Diagnosis not present

## 2024-02-01 LAB — GLUCOSE, CAPILLARY
Glucose-Capillary: 105 mg/dL — ABNORMAL HIGH (ref 70–99)
Glucose-Capillary: 216 mg/dL — ABNORMAL HIGH (ref 70–99)
Glucose-Capillary: 210 mg/dL — ABNORMAL HIGH (ref 70–99)
Glucose-Capillary: 235 mg/dL — ABNORMAL HIGH (ref 70–99)
Glucose-Capillary: 246 mg/dL — ABNORMAL HIGH (ref 70–99)

## 2024-02-01 MED ORDER — INSULIN GLARGINE-YFGN 100 UNIT/ML ~~LOC~~ SOLN
5.0000 [IU] | Freq: Every day | SUBCUTANEOUS | Status: DC
  Administered 2024-02-01 – 2024-02-03 (×2): 5 [IU] via SUBCUTANEOUS
  Filled 2024-02-01 (×3): qty 0.05

## 2024-02-01 MED ORDER — INSULIN ASPART 100 UNIT/ML IJ SOLN
0.0000 [IU] | Freq: Every day | INTRAMUSCULAR | Status: DC
  Administered 2024-02-01 – 2024-02-02 (×2): 2 [IU] via SUBCUTANEOUS

## 2024-02-01 MED ORDER — HYDRALAZINE HCL 20 MG/ML IJ SOLN
10.0000 mg | Freq: Three times a day (TID) | INTRAMUSCULAR | Status: DC | PRN
  Filled 2024-02-01: qty 1

## 2024-02-01 MED ORDER — CHLORHEXIDINE GLUCONATE CLOTH 2 % EX PADS
6.0000 | MEDICATED_PAD | Freq: Every day | CUTANEOUS | Status: DC
  Administered 2024-02-02 – 2024-02-03 (×2): 6 via TOPICAL

## 2024-02-01 MED ORDER — LOSARTAN POTASSIUM 50 MG PO TABS
50.0000 mg | ORAL_TABLET | Freq: Every day | ORAL | Status: DC
  Administered 2024-02-01 – 2024-02-03 (×3): 50 mg via ORAL
  Filled 2024-02-01 (×3): qty 1

## 2024-02-01 MED ORDER — INSULIN ASPART 100 UNIT/ML IJ SOLN
0.0000 [IU] | Freq: Three times a day (TID) | INTRAMUSCULAR | Status: DC
  Administered 2024-02-01: 3 [IU] via SUBCUTANEOUS
  Administered 2024-02-02 – 2024-02-03 (×4): 5 [IU] via SUBCUTANEOUS

## 2024-02-01 NOTE — Inpatient Diabetes Management (Signed)
 Inpatient Diabetes Program Recommendations  AACE/ADA: New Consensus Statement on Inpatient Glycemic Control   Target Ranges:  Prepandial:   less than 140 mg/dL      Peak postprandial:   less than 180 mg/dL (1-2 hours)      Critically ill patients:  140 - 180 mg/dL    Latest Reference Range & Units 01/30/24 20:59 01/30/24 21:41 01/31/24 08:06 01/31/24 11:31 01/31/24 13:35 01/31/24 16:45 01/31/24 18:30 01/31/24 20:12 02/01/24 07:34 02/01/24 11:39  Glucose-Capillary 70 - 99 mg/dL 51 (L) 77 639 (H)  Novolog   18 units 152 (H)   Novolog   6 units 64 (L) 71 105 (H) 216 (H)  Novolog  5 units 210 (H)    Review of Glycemic Control  Diabetes history: DM2 Outpatient Diabetes medications: None Current orders for Inpatient glycemic control: Novolog  0-15 units TID with meals, Novolog  0-5 units at bedtime, Novolog  3 units TID with meals   Inpatient Diabetes Program Recommendations:     Insulin : CBG down to 51 mg/dl at 79:40 on 2/71 (likely due to Novolog  correction) and 64 mg/dl at 83:54 on 2/70 (following Novolog  correction and meal coverage).  CBG 216  mg/dl at 2:65 am today.  Please consider ordering Semglee  5 units Q24H and decrease Novolog  correction to 0-9 units TID with meals.   Diet: Currently ordered regular diet. May want to consider changing diet to Carb Modified Renal diet.   Thanks, Earnie Gainer, RN, MSN, CDCES Diabetes Coordinator Inpatient Diabetes Program [phone] (Team Pager from 8am to 5pm)

## 2024-02-01 NOTE — Plan of Care (Signed)
 Id brief note   Fever had resolved for 2 days Finish 2 weeks doxy/augmentin  on 8/04 No need for id clinic f/u Outpatient wound care Discussed with [facility]

## 2024-02-01 NOTE — TOC Progression Note (Signed)
 Transition of Care [facility]) - Progression Note    Patient Details  Name: [name] [name] MRN: [id number] Date of Birth: 1966/07/04  [facility]) CM/SW Contact  Ranika Mcniel LITTIE Moose, LCSW Phone Number: 02/01/2024, 3:16 PM  Clinical Narrative:    CSW spoke with pt about her placement at [facility]. Pt stated she does not want to be at [facility] anymore. CSW explained that once she was discharged, [facility] would be able to help her request a facility transfer if she would like to be at a different facility. CSW also spoke with pt spouse, [name], to provide him with the information. [name] was agreeable and appreciative for the update.                     Expected Discharge Plan and Services                                               Social Drivers of Health (SDOH) Interventions SDOH Screenings   Food Insecurity: No Food Insecurity (01/25/2024)  Housing: Unknown (01/25/2024)  Transportation Needs: No Transportation Needs (01/25/2024)  Utilities: Not At Risk (01/25/2024)  Social Connections: Unknown (11/15/2021)   Received from [facility]  Tobacco Use: Low Risk  (01/27/2024)    Readmission Risk Interventions     No data to display

## 2024-02-01 NOTE — Progress Notes (Signed)
 PROGRESS NOTE    [name] [name]  FMW:[id number] DOB: 1965/07/25 DOA: 01/24/2024 PCP: Patient, No Pcp Per   Brief Narrative:  This 58 yrs old Female with PMH significant of ESRD on HD TTS, Anemia of CKD, type II DM, neuropathy, left eye blindness, HTN, GERD, lumbar radiculopathy, left BKA, CVA presented to the hospital with complaints of vaginal bleeding. She is Found to have sacral osteomyelitis with concern for bleeding coming from that site. Currently receiving antibiotics.  ID consulted.  Nephrology consulted for continuation of hemodialysis.  Assessment & Plan:   Principal Problem:   Sacral osteomyelitis (HCC) Active Problems:   Constipation   ESRD (end stage renal disease) (HCC)   Type 2 diabetes mellitus (HCC)   Proctocolitis  Sacral osteomyelitis: Intermittent fever: CT scan of the abdomen and pelvis shows evidence of sacral osteomyelitis, some gas. General surgery was consulted,  recommends no intervention or indication for I&D. Initiated on IV vancomycin  and Zosyn . Unfortunately no blood cultures performed at the time of the admission. Repeat culture performed on 7/24.  No growth so far. ID consulted.  Recommended oral doxycycline  and Augmentin  x 2 weeks. Repeat blood cultures ordered due to intermittent fever.   Proctocolitis: Severe constipation: Constipation resolved. Continue current antibiotics Continue current bowel regimen.   Vaginal bleeding:  Ruled out. Seen by GYN outpatient.  No vaginal bleeding. Continue Eliquis  for now. So far no bleeding reported. H&H relatively stable as well.   Possible cystitis: Continue IV antibiotics Outpatient follow-up.   ESRD on HD. TTS schedule. Appreciate nephrology consultation.   Type II DM with peripheral neuropathy: Last hemoglobin A1c 7.1. Continue sliding scale insulin . Continue gabapentin .   Anemia of CKD as well as chronic disease in the setting of osteomyelitis: H&H relatively stable.  Continue to monitor  H&H. Also reports that she has vaginal bleeding which most likely bleeding from the sacral ulcer. For now we will continue to monitor H&H.   Intractable nausea and vomiting. Resolved.   Poor p.o. intake. Patient with ongoing poor p.o. intake including during last admission. Most likely resulting in her debility and sacral ulcer. Continue nutritional supplementation. Discussed and offered appetite stimulant medication although patient currently wants to hold off on that.     DVT prophylaxis: Eliquis  Code Status: Full code Family Communication: No family at bed side. Disposition Plan:   Status is: Inpatient Remains inpatient appropriate because: Severity of illness.    Consultants:  ID Nephrology  Procedures:  Antimicrobials:  Anti-infectives (From admission, onward)    Start     Dose/Rate Route Frequency Ordered Stop   01/30/24 1545  doxycycline  (VIBRA -TABS) tablet 100 mg        100 mg Oral Every 12 hours 01/30/24 1448 02/06/24 2359   01/30/24 1545  amoxicillin -clavulanate (AUGMENTIN ) 500-125 MG per tablet 1 tablet        1 tablet Oral 2 times daily 01/30/24 1448 02/06/24 2359   01/26/24 1200  vancomycin  (VANCOREADY) IVPB 500 mg/100 mL  Status:  Discontinued        500 mg 100 mL/hr over 60 Minutes Intravenous Every T-Th-Sa (Hemodialysis) 01/24/24 2347 01/30/24 1448   01/25/24 0600  piperacillin -tazobactam (ZOSYN ) IVPB 2.25 g  Status:  Discontinued        2.25 g 100 mL/hr over 30 Minutes Intravenous Every 8 hours 01/24/24 2347 01/30/24 1448   01/24/24 1930  piperacillin -tazobactam (ZOSYN ) IVPB 3.375 g  Status:  Discontinued        3.375 g 100 mL/hr over 30 Minutes Intravenous  Once 01/24/24 1923 01/24/24 1928   01/24/24 1930  piperacillin -tazobactam (ZOSYN ) IVPB 2.25 g        2.25 g 100 mL/hr over 30 Minutes Intravenous  Once 01/24/24 1928 01/24/24 2055   01/24/24 1845  vancomycin  (VANCOREADY) IVPB 1250 mg/250 mL        1,250 mg 166.7 mL/hr over 90 Minutes Intravenous   Once 01/24/24 1842 01/24/24 2325      Subjective: Patient was seen and examined at bedside.  Overnight events noted. Patient reports feeling better.  She reports pain is controlled, improving appetite.  Objective: Vitals:   02/01/24 0738 02/01/24 0830 02/01/24 0938 02/01/24 1308  BP: (!) 206/80 (!) 179/83 (!) 186/73 (!) 169/75  Pulse: 74 75 72 77  Resp: 15  16 20   Temp: 98.7 F (37.1 C)  98.9 F (37.2 C) 98.4 F (36.9 C)  TempSrc: Oral  Oral Oral  SpO2: 98% 100% 100% 96%  Weight:      Height:       No intake or output data in the 24 hours ending 02/01/24 1402 Filed Weights   01/28/24 1535 01/28/24 1829 01/31/24 0909  Weight: 55.5 kg 55 kg 60 kg    Examination:  General exam: Appears calm and comfortable, deconditioned, not in any acute distress. Respiratory system: Clear to auscultation. Respiratory effort normal.  RR 16 Cardiovascular system: S1 & S2 heard, RRR. No JVD, murmurs, rubs, gallops or clicks.  Gastrointestinal system: Abdomen is non distended, soft and non tender.  Normal bowel sounds heard. Central nervous system: Alert and oriented x 3. No focal neurological deficits. Extremities: No edema, no cyanosis, no clubbing. Skin: No rashes, lesions or ulcers Psychiatry: Judgement and insight appear normal. Mood & affect appropriate.     Data Reviewed: I have personally reviewed following labs and imaging studies  CBC: Recent Labs  Lab 01/26/24 0841 01/27/24 2144 01/28/24 0803 01/29/24 0716 01/30/24 0742  WBC 19.4* 26.1* 22.1* 27.1* 20.2*  NEUTROABS 13.5*  --  16.6* 20.3* 12.3*  HGB 9.2* 8.3* 8.4* 9.0* 9.1*  HCT 30.3* 27.2* 26.9* 29.4* 29.3*  MCV 76.3* 75.6* 74.3* 73.7* 73.3*  PLT 364 388 377 373 450*   Basic Metabolic Panel: Recent Labs  Lab 01/26/24 0841 01/28/24 0803 01/29/24 0716 01/30/24 0742 01/31/24 0656  NA 134* 131* 135 134* 130*  K 4.7 3.7 4.3 4.2 4.7  CL 98 97* 98 96* 95*  CO2 23 22 21* 23 21*  GLUCOSE 223* 167* 204* 288* 343*   BUN 30* 24* 21* 43* 53*  CREATININE 4.51* 3.62* 2.75* 3.62* 3.91*  CALCIUM  7.9* 7.5* 7.7* 7.6* 7.6*  MG 1.7 1.6* 2.1 2.0  --   PHOS  --   --   --   --  3.4   GFR: Estimated Creatinine Clearance: 12.9 mL/min (A) (by C-G formula based on SCr of 3.91 mg/dL (H)). Liver Function Tests: Recent Labs  Lab 01/26/24 0841 01/28/24 0803 01/29/24 0716 01/30/24 0742 01/31/24 0656  AST 13* 16 20 18   --   ALT 7 8 7 10   --   ALKPHOS 122 112 118 115  --   BILITOT 0.9 1.1 0.9 0.4  --   PROT 6.5 6.0* 6.4* 6.2*  --   ALBUMIN  1.8* 1.5* 1.6* 1.6* 1.6*   No results for input(s): LIPASE, AMYLASE in the last 168 hours. No results for input(s): AMMONIA in the last 168 hours. Coagulation Profile: No results for input(s): INR, PROTIME in the last 168 hours. Cardiac Enzymes: No results  for input(s): CKTOTAL, CKMB, CKMBINDEX, TROPONINI in the last 168 hours. BNP (last 3 results) No results for input(s): PROBNP in the last 8760 hours. HbA1C: No results for input(s): HGBA1C in the last 72 hours. CBG: Recent Labs  Lab 01/31/24 1645 01/31/24 1830 01/31/24 2012 02/01/24 0734 02/01/24 1139  GLUCAP 64* 71 105* 216* 210*   Lipid Profile: No results for input(s): CHOL, HDL, LDLCALC, TRIG, CHOLHDL, LDLDIRECT in the last 72 hours. Thyroid  Function Tests: No results for input(s): TSH, T4TOTAL, FREET4, T3FREE, THYROIDAB in the last 72 hours. Anemia Panel: No results for input(s): VITAMINB12, FOLATE, FERRITIN, TIBC, IRON , RETICCTPCT in the last 72 hours. Sepsis Labs: Recent Labs  Lab 01/27/24 2144  LATICACIDVEN 1.1    Recent Results (from the past 240 hours)  Culture, blood (Routine X 2) w Reflex to ID Panel     Status: None   Collection Time: 01/26/24  6:54 PM   Specimen: BLOOD  Result Value Ref Range Status   Specimen Description BLOOD RIGHT ANTECUBITAL  Final   Special Requests   Final    BOTTLES DRAWN AEROBIC AND ANAEROBIC Blood Culture  results may not be optimal due to an inadequate volume of blood received in culture bottles   Culture   Final    NO GROWTH 5 DAYS Performed at [facility], [address] [place], [place] [address]    Report Status 01/31/2024 FINAL  Final  Culture, blood (Routine X 2) w Reflex to ID Panel     Status: None   Collection Time: 01/26/24  7:02 PM   Specimen: BLOOD  Result Value Ref Range Status   Specimen Description BLOOD SITE NOT SPECIFIED  Final   Special Requests   Final    BOTTLES DRAWN AEROBIC ONLY Blood Culture results may not be optimal due to an inadequate volume of blood received in culture bottles   Culture   Final    NO GROWTH 5 DAYS Performed at [facility], [address] [place], [place] [address]    Report Status 01/31/2024 FINAL  Final  Culture, blood (Routine X 2) w Reflex to ID Panel     Status: None (Preliminary result)   Collection Time: 01/30/24 10:07 AM   Specimen: BLOOD RIGHT HAND  Result Value Ref Range Status   Specimen Description BLOOD RIGHT HAND  Final   Special Requests   Final    BOTTLES DRAWN AEROBIC ONLY Blood Culture results may not be optimal due to an inadequate volume of blood received in culture bottles   Culture   Final    NO GROWTH 2 DAYS Performed at [facility], [address], [place], [place] [address]    Report Status PENDING  Incomplete  Culture, blood (Routine X 2) w Reflex to ID Panel     Status: None (Preliminary result)   Collection Time: 01/30/24 10:11 AM   Specimen: BLOOD LEFT HAND  Result Value Ref Range Status   Specimen Description BLOOD LEFT HAND  Final   Special Requests   Final    BOTTLES DRAWN AEROBIC ONLY Blood Culture adequate volume   Culture   Final    NO GROWTH 2 DAYS Performed at [facility], [address] [place], [place] [address]    Report Status PENDING  Incomplete   Radiology Studies: No results found.  Scheduled Meds:  (feeding supplement) PROSource Plus  30 mL Oral TID BM    amoxicillin -clavulanate  1 tablet Oral BID  apixaban   5 mg Oral BID   ascorbic acid   1,000 mg Oral Daily   atorvastatin   20 mg Oral Daily   calcitRIOL   0.25 mcg Oral Q T,Th,Sa-HD   Chlorhexidine  Gluconate Cloth  6 each Topical Daily   cloNIDine   0.2 mg Transdermal Weekly   collagenase    Topical Daily   doxycycline   100 mg Oral Q12H   feeding supplement (KATE FARMS STANDARD 1.4)  325 mL Oral BID BM   gabapentin   100 mg Oral TID   heparin  sodium (porcine)  3,800 Units Intracatheter Once   hydrOXYzine   10 mg Oral TID   insulin  aspart  0-5 Units Subcutaneous QHS   insulin  aspart  0-9 Units Subcutaneous TID WC   insulin  aspart  3 Units Subcutaneous TID WC   insulin  glargine-yfgn  5 Units Subcutaneous Daily   lidocaine   1 patch Transdermal See admin instructions   losartan   50 mg Oral Daily   multivitamin  1 tablet Oral Q2000   nutrition supplement (JUVEN)  1 packet Oral BID BM   pantoprazole   40 mg Oral Daily   saccharomyces boulardii  250 mg Oral BID   zinc  sulfate (50mg  elemental zinc )  220 mg Oral Daily   Continuous Infusions:   LOS: 8 days    Time spent: 50 mins    Darcel Dawley, MD [facility]   If 7PM-7AM, please contact night-coverage

## 2024-02-01 NOTE — Progress Notes (Signed)
 Pt refused atarax  po. Pt refused dressing changed on sacral areas and L knee. Stated that I'm not available right now. RN explained the need of changing dressing as well as measuring wounds to see how wound progressing but pt adamantly refused. MD notified and aware.   Amado GORMAN Arabia, RN

## 2024-02-01 NOTE — Progress Notes (Addendum)
 [facility] Progress Note   Subjective:    Seen and examined patient at bedside. Informed yesterday of her Bps dropping during HD and patient becoming symptomatic. She's on Clonidine  patch as monotherapy. Bps now back up. She denies SOB, CP, and N/V and feels she doesn't have much on. Patient also tells me she is not happy at current SNF and wants to be transferred. Reached out to Hospitalist on anticipated discharge date. Next HD 7/31.  Objective Vitals:   02/01/24 0415 02/01/24 0738 02/01/24 0830 02/01/24 0938  BP: (!) 192/76 (!) 206/80 (!) 179/83 (!) 186/73  Pulse: 70 74 75 72  Resp: 18 15  16   Temp: 98.6 F (37 C) 98.7 F (37.1 C)  98.9 F (37.2 C)  TempSrc: Oral Oral  Oral  SpO2: 99% 98% 100% 100%  Weight:      Height:       Physical Exam General: Awake, alert, NAD, on RA Heart: S1 and S2; No murmurs, gallops, or rubs Lungs: Clear anteriorly Abdomen: Soft and non-tender Extremities: Do not appreciate LE edema Dialysis Access: R[facility]   Filed Weights   01/28/24 1535 01/28/24 1829 01/31/24 0909  Weight: 55.5 kg 55 kg 60 kg    Intake/Output Summary (Last 24 hours) at 02/01/2024 1019 Last data filed at 01/31/2024 1237 Gross per 24 hour  Intake --  Output -0.3 ml  Net 0.3 ml    Additional Objective Labs: Basic Metabolic Panel: Recent Labs  Lab 01/29/24 0716 01/30/24 0742 01/31/24 0656  NA 135 134* 130*  K 4.3 4.2 4.7  CL 98 96* 95*  CO2 21* 23 21*  GLUCOSE 204* 288* 343*  BUN 21* 43* 53*  CREATININE 2.75* 3.62* 3.91*  CALCIUM  7.7* 7.6* 7.6*  PHOS  --   --  3.4   Liver Function Tests: Recent Labs  Lab 01/28/24 0803 01/29/24 0716 01/30/24 0742 01/31/24 0656  AST 16 20 18   --   ALT 8 7 10   --   ALKPHOS 112 118 115  --   BILITOT 1.1 0.9 0.4  --   PROT 6.0* 6.4* 6.2*  --   ALBUMIN  1.5* 1.6* 1.6* 1.6*   No results for input(s): LIPASE, AMYLASE in the last 168 hours. CBC: Recent Labs  Lab 01/26/24 0841 01/27/24 2144  01/28/24 0803 01/29/24 0716 01/30/24 0742  WBC 19.4* 26.1* 22.1* 27.1* 20.2*  NEUTROABS 13.5*  --  16.6* 20.3* 12.3*  HGB 9.2* 8.3* 8.4* 9.0* 9.1*  HCT 30.3* 27.2* 26.9* 29.4* 29.3*  MCV 76.3* 75.6* 74.3* 73.7* 73.3*  PLT 364 388 377 373 450*   Blood Culture    Component Value Date/Time   SDES BLOOD LEFT HAND 01/30/2024 1011   SPECREQUEST  01/30/2024 1011    BOTTLES DRAWN AEROBIC ONLY Blood Culture adequate volume   CULT  01/30/2024 1011    NO GROWTH < 24 HOURS Performed at [facility], [address], [place], [place] [address]    REPTSTATUS PENDING 01/30/2024 1011    Cardiac Enzymes: No results for input(s): CKTOTAL, CKMB, CKMBINDEX, TROPONINI in the last 168 hours. CBG: Recent Labs  Lab 01/31/24 1131 01/31/24 1645 01/31/24 1830 01/31/24 2012 02/01/24 0734  GLUCAP 152* 64* 71 105* 216*   Iron  Studies: No results for input(s): IRON , TIBC, TRANSFERRIN, FERRITIN in the last 72 hours. Lab Results  Component Value Date   INR 1.1 03/08/2020   INR 1.1 03/07/2020   INR 1.0 10/10/2014   Studies/Results: No results found.  Medications:   (  feeding supplement) PROSource Plus  30 mL Oral TID BM   amoxicillin -clavulanate  1 tablet Oral BID   apixaban   5 mg Oral BID   ascorbic acid   1,000 mg Oral Daily   atorvastatin   20 mg Oral Daily   calcitRIOL   0.25 mcg Oral Q T,Th,Sa-HD   Chlorhexidine  Gluconate Cloth  6 each Topical Daily   cloNIDine   0.2 mg Transdermal Weekly   collagenase    Topical Daily   doxycycline   100 mg Oral Q12H   feeding supplement (KATE FARMS STANDARD 1.4)  325 mL Oral BID BM   gabapentin   100 mg Oral TID   heparin  sodium (porcine)  3,800 Units Intracatheter Once   hydrOXYzine   10 mg Oral TID   insulin  aspart  0-15 Units Subcutaneous TID WC   insulin  aspart  0-5 Units Subcutaneous QHS   insulin  aspart  3 Units Subcutaneous TID WC   lidocaine   1 patch Transdermal See admin instructions   losartan   50 mg Oral Daily    multivitamin  1 tablet Oral Q2000   nutrition supplement (JUVEN)  1 packet Oral BID BM   pantoprazole   40 mg Oral Daily   saccharomyces boulardii  250 mg Oral BID   zinc  sulfate (50mg  elemental zinc )  220 mg Oral Daily    Dialysis Orders: G-O TTS 3h   B350   60.3 kg   TDC   Heparin  2000 Last HD yest, came off 58.5kg Missed HD 7/19, o/w good compliance   Home bp meds: Norvasc  5 every day Catapres  patch 0.3 weekly Metoprolol  50 bid  Assessment/Plan: Sacral osteomyelitis: Per CT, gen surg consulted- for now cont IV abx. WBC remains up. Per pmd.  ESRD: on HD TTS. Next HD 7/31.  HTN: Bps were low initially but now back up. Getting clonidine  0.2 patch as monotherapy for now and home metoprolol  and norvasc  are on hold. Continue to monitor Bps especially after HD tomorrow. Will consider restarting Amlodipine  (low-dose) if no improvement. Volume: didn't appreciate LE edema, no resp issues. 5kg under today, lower dry wt at dc.  Anemia of esrd: Hb 9-11 here, follow.  Dispo - Patient reports she's not happy at current SNF and wants to be transferred. Discussed with Hospitalist, SW, and renal navigator. Possible discharge tomorrow so not sure if facility can be situated during this admission. She's okay for discharge from a renal standpoint.   Charmaine Piety, NP [facility] 02/01/2024,10:19 AM  LOS: 8 days

## 2024-02-02 DIAGNOSIS — M4628 Osteomyelitis of vertebra, sacral and sacrococcygeal region: Secondary | ICD-10-CM | POA: Diagnosis not present

## 2024-02-02 LAB — COMPREHENSIVE METABOLIC PANEL WITH GFR
ALT: 7 U/L (ref 0–44)
AST: 15 U/L (ref 15–41)
Albumin: <1.5 g/dL — ABNORMAL LOW (ref 3.5–5.0)
Alkaline Phosphatase: 141 U/L — ABNORMAL HIGH (ref 38–126)
Anion gap: 9 (ref 5–15)
BUN: 39 mg/dL — ABNORMAL HIGH (ref 6–20)
CO2: 25 mmol/L (ref 22–32)
Calcium: 7.3 mg/dL — ABNORMAL LOW (ref 8.9–10.3)
Chloride: 100 mmol/L (ref 98–111)
Creatinine, Ser: 3.74 mg/dL — ABNORMAL HIGH (ref 0.44–1.00)
GFR, Estimated: 13 mL/min — ABNORMAL LOW (ref >60)
Glucose, Bld: 247 mg/dL — ABNORMAL HIGH (ref 70–99)
Potassium: 4.6 mmol/L (ref 3.5–5.1)
Sodium: 134 mmol/L — ABNORMAL LOW (ref 135–145)
Total Bilirubin: 0.5 mg/dL (ref 0.0–1.2)
Total Protein: 5.9 g/dL — ABNORMAL LOW (ref 6.5–8.1)

## 2024-02-02 LAB — CBC
HCT: 27.6 % — ABNORMAL LOW (ref 36.0–46.0)
Hemoglobin: 8.6 g/dL — ABNORMAL LOW (ref 12.0–15.0)
MCH: 22.9 pg — ABNORMAL LOW (ref 26.0–34.0)
MCHC: 31.2 g/dL (ref 30.0–36.0)
MCV: 73.4 fL — ABNORMAL LOW (ref 80.0–100.0)
Platelets: 527 K/uL — ABNORMAL HIGH (ref 150–400)
RBC: 3.76 MIL/uL — ABNORMAL LOW (ref 3.87–5.11)
RDW: 21.2 % — ABNORMAL HIGH (ref 11.5–15.5)
WBC: 18.6 K/uL — ABNORMAL HIGH (ref 4.0–10.5)
nRBC: 0 % (ref 0.0–0.2)

## 2024-02-02 LAB — ZINC: Zinc: 56 ug/dL (ref 44–115)

## 2024-02-02 LAB — VITAMIN E
Vitamin E (Alpha Tocopherol): 6.4 mg/L — ABNORMAL LOW (ref 7.0–25.1)
Vitamin E(Gamma Tocopherol): 1.4 mg/L (ref 0.5–5.5)

## 2024-02-02 LAB — GLUCOSE, CAPILLARY
Glucose-Capillary: 270 mg/dL — ABNORMAL HIGH (ref 70–99)
Glucose-Capillary: 65 mg/dL — ABNORMAL LOW (ref 70–99)
Glucose-Capillary: 94 mg/dL (ref 70–99)
Glucose-Capillary: 268 mg/dL — ABNORMAL HIGH (ref 70–99)
Glucose-Capillary: 223 mg/dL — ABNORMAL HIGH (ref 70–99)

## 2024-02-02 LAB — MAGNESIUM: Magnesium: 1.8 mg/dL (ref 1.7–2.4)

## 2024-02-02 LAB — VITAMIN A: Vitamin A (Retinoic Acid): 11.4 ug/dL — ABNORMAL LOW (ref 20.1–62.0)

## 2024-02-02 LAB — PHOSPHORUS: Phosphorus: 3.4 mg/dL (ref 2.5–4.6)

## 2024-02-02 MED ORDER — LIDOCAINE-PRILOCAINE 2.5-2.5 % EX CREA: 1.0000 | TOPICAL_CREAM | CUTANEOUS | Status: DC | PRN

## 2024-02-02 MED ORDER — HEPARIN SODIUM (PORCINE) 1000 UNIT/ML DIALYSIS
1000.0000 [IU] | INTRAMUSCULAR | Status: DC | PRN
  Administered 2024-02-02: 3800 [IU] via INTRAVENOUS_CENTRAL

## 2024-02-02 MED ORDER — HEPARIN SODIUM (PORCINE) 1000 UNIT/ML IJ SOLN
INTRAMUSCULAR | Status: AC
  Filled 2024-02-02: qty 3

## 2024-02-02 MED ORDER — LIDOCAINE HCL (PF) 1 % IJ SOLN: 5.0000 mL | INTRAMUSCULAR | Status: DC | PRN

## 2024-02-02 MED ORDER — PENTAFLUOROPROP-TETRAFLUOROETH EX AERO: 1.0000 | INHALATION_SPRAY | CUTANEOUS | Status: DC | PRN

## 2024-02-02 MED ORDER — HEPARIN SODIUM (PORCINE) 1000 UNIT/ML IJ SOLN
INTRAMUSCULAR | Status: AC
  Filled 2024-02-02: qty 4

## 2024-02-02 MED ORDER — ALTEPLASE 2 MG IJ SOLR: 2.0000 mg | Freq: Once | INTRAMUSCULAR | Status: DC | PRN

## 2024-02-02 NOTE — Progress Notes (Signed)
 Sacral dressing changed w/ incontinence care. Pt tolerated fairly well. Call bell in reach

## 2024-02-02 NOTE — Progress Notes (Signed)
 [facility] Progress Note   Subjective:    Seen and examined patient on HD. Tolerating UFG . SBP is 160. Her Bps dropped with HD last treatment. Discussed with Hospitalist yesterday. Plan for possible discharge to SNF after HD today.  Objective Vitals:   02/02/24 0900 02/02/24 0932 02/02/24 1000 02/02/24 1030  BP: (!) 142/80 125/75 117/76 127/73  Pulse:   69 87  Resp:   11 13  Temp:      TempSrc:      SpO2:   100% 100%  Weight:      Height:       Physical Exam General: Awake, alert, NAD, on RA Heart: S1 and S2; No murmurs, gallops, or rubs Lungs: Clear anteriorly Abdomen: Soft and non-tender Extremities: 1+ RLE edema Dialysis Access: [facility]   Filed Weights   01/31/24 0909  Weight: 60 kg    Intake/Output Summary (Last 24 hours) at 02/02/2024 1034 Last data filed at 02/02/2024 0800 Gross per 24 hour  Intake 0 ml  Output --  Net 0 ml    Additional Objective Labs: Basic Metabolic Panel: Recent Labs  Lab 01/30/24 0742 01/31/24 0656 02/02/24 0500  NA 134* 130* 134*  K 4.2 4.7 4.6  CL 96* 95* 100  CO2 23 21* 25  GLUCOSE 288* 343* 247*  BUN 43* 53* 39*  CREATININE 3.62* 3.91* 3.74*  CALCIUM  7.6* 7.6* 7.3*  PHOS  --  3.4 3.4   Liver Function Tests: Recent Labs  Lab 01/29/24 0716 01/30/24 0742 01/31/24 0656 02/02/24 0500  AST 20 18  --  15  ALT 7 10  --  7  ALKPHOS 118 115  --  141*  BILITOT 0.9 0.4  --  0.5  PROT 6.4* 6.2*  --  5.9*  ALBUMIN  1.6* 1.6* 1.6* <1.5*   No results for input(s): LIPASE, AMYLASE in the last 168 hours. CBC: Recent Labs  Lab 01/27/24 2144 01/28/24 0803 01/29/24 0716 01/30/24 0742 02/02/24 0500  WBC 26.1* 22.1* 27.1* 20.2* 18.6*  NEUTROABS  --  16.6* 20.3* 12.3*  --   HGB 8.3* 8.4* 9.0* 9.1* 8.6*  HCT 27.2* 26.9* 29.4* 29.3* 27.6*  MCV 75.6* 74.3* 73.7* 73.3* 73.4*  PLT 388 377 373 450* 527*   Blood Culture    Component Value Date/Time   SDES BLOOD LEFT HAND 01/30/2024 1011   SPECREQUEST   01/30/2024 1011    BOTTLES DRAWN AEROBIC ONLY Blood Culture adequate volume   CULT  01/30/2024 1011    NO GROWTH 2 DAYS Performed at [facility], [address], [place], [place] [address]    REPTSTATUS PENDING 01/30/2024 1011    Cardiac Enzymes: No results for input(s): CKTOTAL, CKMB, CKMBINDEX, TROPONINI in the last 168 hours. CBG: Recent Labs  Lab 02/01/24 0734 02/01/24 1139 02/01/24 1709 02/01/24 2022 02/02/24 0751  GLUCAP 216* 210* 235* 246* 270*   Iron  Studies: No results for input(s): IRON , TIBC, TRANSFERRIN, FERRITIN in the last 72 hours. Lab Results  Component Value Date   INR 1.1 03/08/2020   INR 1.1 03/07/2020   INR 1.0 10/10/2014   Studies/Results: No results found.  Medications:   (feeding supplement) PROSource Plus  30 mL Oral TID BM   amoxicillin -clavulanate  1 tablet Oral BID   apixaban   5 mg Oral BID   ascorbic acid   1,000 mg Oral Daily   atorvastatin   20 mg Oral Daily   calcitRIOL   0.25 mcg Oral Q T,Th,Sa-HD   Chlorhexidine  Gluconate Cloth  6 each Topical Q0600   collagenase    Topical Daily   doxycycline   100 mg Oral Q12H   feeding supplement (KATE FARMS STANDARD 1.4)  325 mL Oral BID BM   gabapentin   100 mg Oral TID   hydrOXYzine   10 mg Oral TID   insulin  aspart  0-5 Units Subcutaneous QHS   insulin  aspart  0-9 Units Subcutaneous TID WC   insulin  aspart  3 Units Subcutaneous TID WC   insulin  glargine-yfgn  5 Units Subcutaneous Daily   lidocaine   1 patch Transdermal See admin instructions   losartan   50 mg Oral Daily   multivitamin  1 tablet Oral Q2000   nutrition supplement (JUVEN)  1 packet Oral BID BM   pantoprazole   40 mg Oral Daily   saccharomyces boulardii  250 mg Oral BID   zinc  sulfate (50mg  elemental zinc )  220 mg Oral Daily    Dialysis Orders: G-O TTS 3h   B350   60.3 kg   TDC   Heparin  2000 Last HD yest, came off 58.5kg Missed HD 7/19, o/w good compliance   Home bp meds: Norvasc  5 every  day Catapres  patch 0.3 weekly Metoprolol  50 bid  Assessment/Plan: Sacral osteomyelitis: Per CT, gen surg consulted- for now cont IV abx. WBC remains up. Per pmd.  ESRD: on HD TTS. On HD.  HTN: Bps were low initially but now back up. Getting clonidine  0.2 patch as monotherapy for now and home metoprolol  and norvasc  are on hold. Continue to monitor Bps especially after HD today. Will consider restarting Amlodipine  (low-dose) if no improvement. Volume: dependent RLE edema but not severely overaloded, no resp issues. 5kg under today, lower dry wt at dc.  Anemia of esrd: Hb 9-11 here, follow.  Dispo - Patient reports she's not happy at current SNF and wants to be transferred. Discussed with Hospitalist, SW, and renal navigator. I was informed that she will able to discuss this matter with the SW at her current SNF. Okay for discharge from a renal standpoint.   Charmaine Piety, NP [facility] 02/02/2024,10:34 AM  LOS: 9 days

## 2024-02-02 NOTE — Progress Notes (Signed)
 PROGRESS NOTE    ZARA WENDT  FMW:[id number] DOB: [birth year] DOA: 01/24/2024 PCP: Patient, No Pcp Per   Brief Narrative:  This 58 yrs old Female with PMH significant of ESRD on HD TTS, Anemia of CKD, type II DM, neuropathy, left eye blindness, HTN, GERD, lumbar radiculopathy, left BKA, CVA presented to the hospital with complaints of vaginal bleeding. She is Found to have sacral osteomyelitis with concern for bleeding coming from that site. Currently receiving antibiotics.  ID consulted.  Nephrology consulted for continuation of hemodialysis.  Assessment & Plan:   Principal Problem:   Sacral osteomyelitis (HCC) Active Problems:   Constipation   ESRD (end stage renal disease) (HCC)   Type 2 diabetes mellitus (HCC)   Proctocolitis  Sacral osteomyelitis: Intermittent fever: CT scan of the abdomen and pelvis shows evidence of sacral osteomyelitis, some gas. General surgery was consulted,  recommends no intervention or indication for I&D. Initiated on IV vancomycin  and Zosyn . Unfortunately no blood cultures performed at the time of the admission. Repeat culture performed on 7/24.  No growth so far. ID consulted.  Recommended oral doxycycline  and Augmentin  x 2 weeks. Repeat blood cultures ordered due to intermittent fever.   Proctocolitis: Severe constipation: Constipation resolved. Continue current antibiotics. Continue current bowel regimen.   Vaginal bleeding:  Ruled out. Seen by GYN outpatient.  No vaginal bleeding. Continue Eliquis  for now. So far no bleeding reported. H&H relatively stable as well.   Possible cystitis: Continue IV antibiotics Outpatient follow-up.   ESRD on HD. TTS schedule. Appreciate nephrology consultation.   Type II DM with peripheral neuropathy: Last hemoglobin A1c 7.1. Continue sliding scale insulin . Continue gabapentin .   Anemia of CKD as well as chronic disease in the setting of osteomyelitis: H&H relatively stable.  Continue to monitor  H&H. Also reports that she has vaginal bleeding which most likely bleeding from the sacral ulcer. For now we will continue to monitor H&H.   Intractable nausea and vomiting. Resolved.   Poor p.o. intake. Patient with ongoing poor p.o. intake including during last admission. Most likely resulting in her debility and sacral ulcer. Continue nutritional supplementation. Discussed and offered appetite stimulant medication although patient currently wants to hold off on that.    DVT prophylaxis: Eliquis  Code Status: Full code Family Communication: No family at bed side. Disposition Plan:   Status is: Inpatient Remains inpatient appropriate because: Severity of illness.    Consultants:  ID Nephrology  Procedures:  Antimicrobials:  Anti-infectives (From admission, onward)    Start     Dose/Rate Route Frequency Ordered Stop   01/30/24 1545  doxycycline  (VIBRA -TABS) tablet 100 mg        100 mg Oral Every 12 hours 01/30/24 1448 02/06/24 2359   01/30/24 1545  amoxicillin -clavulanate (AUGMENTIN ) 500-125 MG per tablet 1 tablet        1 tablet Oral 2 times daily 01/30/24 1448 02/06/24 2359   01/26/24 1200  vancomycin  (VANCOREADY) IVPB 500 mg/100 mL  Status:  Discontinued        500 mg 100 mL/hr over 60 Minutes Intravenous Every T-Th-Sa (Hemodialysis) 01/24/24 2347 01/30/24 1448   01/25/24 0600  piperacillin -tazobactam (ZOSYN ) IVPB 2.25 g  Status:  Discontinued        2.25 g 100 mL/hr over 30 Minutes Intravenous Every 8 hours 01/24/24 2347 01/30/24 1448   01/24/24 1930  piperacillin -tazobactam (ZOSYN ) IVPB 3.375 g  Status:  Discontinued        3.375 g 100 mL/hr over 30 Minutes Intravenous  Once 01/24/24 1923 01/24/24 1928   01/24/24 1930  piperacillin -tazobactam (ZOSYN ) IVPB 2.25 g        2.25 g 100 mL/hr over 30 Minutes Intravenous  Once 01/24/24 1928 01/24/24 2055   01/24/24 1845  vancomycin  (VANCOREADY) IVPB 1250 mg/250 mL        1,250 mg 166.7 mL/hr over 90 Minutes Intravenous   Once 01/24/24 1842 01/24/24 2325      Subjective: Patient was seen and examined at HD suite.  Overnight events noted. Patient reports not feeling well,  reports feeling no energy and drained during dialysis.  Objective: Vitals:   02/02/24 1130 02/02/24 1145 02/02/24 1156 02/02/24 1239  BP: 123/76 124/84 (!) 163/88 (!) 145/66  Pulse:    79  Resp:    17  Temp:   98.5 F (36.9 C) 98.1 F (36.7 C)  TempSrc:   Oral Oral  SpO2:    100%  Weight:      Height:        Intake/Output Summary (Last 24 hours) at 02/02/2024 1432 Last data filed at 02/02/2024 1156 Gross per 24 hour  Intake 0 ml  Output 500 ml  Net -500 ml   Filed Weights   01/31/24 0909  Weight: 60 kg    Examination:  General exam: Appears calm and comfortable, deconditioned, not in any acute distress. Respiratory system: CTA Bilaterally . Respiratory effort normal.  RR 14 Cardiovascular system: S1 & S2 heard, RRR. No JVD, murmurs, rubs, gallops or clicks.  Gastrointestinal system: Abdomen is non distended, soft and non tender.  Normal bowel sounds heard. Central nervous system: Alert and oriented x 2. No focal neurological deficits. Extremities: No edema, no cyanosis, no clubbing. Skin: No rashes, lesions or ulcers Psychiatry: Judgement and insight appear normal. Mood & affect appropriate.     Data Reviewed: I have personally reviewed following labs and imaging studies  CBC: Recent Labs  Lab 01/27/24 2144 01/28/24 0803 01/29/24 0716 01/30/24 0742 02/02/24 0500  WBC 26.1* 22.1* 27.1* 20.2* 18.6*  NEUTROABS  --  16.6* 20.3* 12.3*  --   HGB 8.3* 8.4* 9.0* 9.1* 8.6*  HCT 27.2* 26.9* 29.4* 29.3* 27.6*  MCV 75.6* 74.3* 73.7* 73.3* 73.4*  PLT 388 377 373 450* 527*   Basic Metabolic Panel: Recent Labs  Lab 01/28/24 0803 01/29/24 0716 01/30/24 0742 01/31/24 0656 02/02/24 0500  NA 131* 135 134* 130* 134*  K 3.7 4.3 4.2 4.7 4.6  CL 97* 98 96* 95* 100  CO2 22 21* 23 21* 25  GLUCOSE 167* 204* 288* 343*  247*  BUN 24* 21* 43* 53* 39*  CREATININE 3.62* 2.75* 3.62* 3.91* 3.74*  CALCIUM  7.5* 7.7* 7.6* 7.6* 7.3*  MG 1.6* 2.1 2.0  --  1.8  PHOS  --   --   --  3.4 3.4   GFR: Estimated Creatinine Clearance: 13.4 mL/min (A) (by C-G formula based on SCr of 3.74 mg/dL (H)). Liver Function Tests: Recent Labs  Lab 01/28/24 0803 01/29/24 0716 01/30/24 0742 01/31/24 0656 02/02/24 0500  AST 16 20 18   --  15  ALT 8 7 10   --  7  ALKPHOS 112 118 115  --  141*  BILITOT 1.1 0.9 0.4  --  0.5  PROT 6.0* 6.4* 6.2*  --  5.9*  ALBUMIN  1.5* 1.6* 1.6* 1.6* <1.5*   No results for input(s): LIPASE, AMYLASE in the last 168 hours. No results for input(s): AMMONIA in the last 168 hours. Coagulation Profile: No results for input(s):  INR, PROTIME in the last 168 hours. Cardiac Enzymes: No results for input(s): CKTOTAL, CKMB, CKMBINDEX, TROPONINI in the last 168 hours. BNP (last 3 results) No results for input(s): PROBNP in the last 8760 hours. HbA1C: No results for input(s): HGBA1C in the last 72 hours. CBG: Recent Labs  Lab 02/01/24 1709 02/01/24 2022 02/02/24 0751 02/02/24 1241 02/02/24 1339  GLUCAP 235* 246* 270* 65* 94   Lipid Profile: No results for input(s): CHOL, HDL, LDLCALC, TRIG, CHOLHDL, LDLDIRECT in the last 72 hours. Thyroid  Function Tests: No results for input(s): TSH, T4TOTAL, FREET4, T3FREE, THYROIDAB in the last 72 hours. Anemia Panel: No results for input(s): VITAMINB12, FOLATE, FERRITIN, TIBC, IRON , RETICCTPCT in the last 72 hours. Sepsis Labs: Recent Labs  Lab 01/27/24 2144  LATICACIDVEN 1.1    Recent Results (from the past 240 hours)  Culture, blood (Routine X 2) w Reflex to ID Panel     Status: None   Collection Time: 01/26/24  6:54 PM   Specimen: BLOOD  Result Value Ref Range Status   Specimen Description BLOOD RIGHT ANTECUBITAL  Final   Special Requests   Final    BOTTLES DRAWN AEROBIC AND ANAEROBIC Blood  Culture results may not be optimal due to an inadequate volume of blood received in culture bottles   Culture   Final    NO GROWTH 5 DAYS Performed at [facility], [address], [place], [place] [address]    Report Status 01/31/2024 FINAL  Final  Culture, blood (Routine X 2) w Reflex to ID Panel     Status: None   Collection Time: 01/26/24  7:02 PM   Specimen: BLOOD  Result Value Ref Range Status   Specimen Description BLOOD SITE NOT SPECIFIED  Final   Special Requests   Final    BOTTLES DRAWN AEROBIC ONLY Blood Culture results may not be optimal due to an inadequate volume of blood received in culture bottles   Culture   Final    NO GROWTH 5 DAYS Performed at [facility], [address] [place], [place] [address]    Report Status 01/31/2024 FINAL  Final  Culture, blood (Routine X 2) w Reflex to ID Panel     Status: None (Preliminary result)   Collection Time: 01/30/24 10:07 AM   Specimen: BLOOD RIGHT HAND  Result Value Ref Range Status   Specimen Description BLOOD RIGHT HAND  Final   Special Requests   Final    BOTTLES DRAWN AEROBIC ONLY Blood Culture results may not be optimal due to an inadequate volume of blood received in culture bottles   Culture   Final    NO GROWTH 3 DAYS Performed at [facility], [address], [place], [place] [address]    Report Status PENDING  Incomplete  Culture, blood (Routine X 2) w Reflex to ID Panel     Status: None (Preliminary result)   Collection Time: 01/30/24 10:11 AM   Specimen: BLOOD LEFT HAND  Result Value Ref Range Status   Specimen Description BLOOD LEFT HAND  Final   Special Requests   Final    BOTTLES DRAWN AEROBIC ONLY Blood Culture adequate volume   Culture   Final    NO GROWTH 3 DAYS Performed at [facility], [address], [place], [place] [address]    Report Status PENDING  Incomplete   Radiology Studies: No results found.  Scheduled Meds:  (feeding supplement) PROSource Plus  30 mL Oral  TID BM   amoxicillin -clavulanate  1 tablet Oral BID   apixaban   5 mg Oral BID   ascorbic acid   1,000 mg Oral Daily   atorvastatin   20 mg Oral Daily   calcitRIOL   0.25 mcg Oral Q T,Th,Sa-HD   Chlorhexidine  Gluconate Cloth  6 each Topical Q0600   collagenase    Topical Daily   doxycycline   100 mg Oral Q12H   feeding supplement (KATE FARMS STANDARD 1.4)  325 mL Oral BID BM   gabapentin   100 mg Oral TID   hydrOXYzine   10 mg Oral TID   insulin  aspart  0-5 Units Subcutaneous QHS   insulin  aspart  0-9 Units Subcutaneous TID WC   insulin  aspart  3 Units Subcutaneous TID WC   insulin  glargine-yfgn  5 Units Subcutaneous Daily   lidocaine   1 patch Transdermal See admin instructions   losartan   50 mg Oral Daily   multivitamin  1 tablet Oral Q2000   nutrition supplement (JUVEN)  1 packet Oral BID BM   pantoprazole   40 mg Oral Daily   saccharomyces boulardii  250 mg Oral BID   zinc  sulfate (50mg  elemental zinc )  220 mg Oral Daily   Continuous Infusions:   LOS: 9 days    Time spent: 35 mins    Darcel Dawley, MD [facility]   If 7PM-7AM, please contact night-coverage

## 2024-02-02 NOTE — Progress Notes (Signed)
 Received patient in bed.Awake,alert and oriented x 3. [employer] verified.  Access used: Left hd catheter that worked well.Dressing on date.  Duration of treatment: 3 hours.  Uf goal :  Met 500 ml as [prescribed.  Medicine given : Heparin  2000 and 1000 unit pre and mid run dose.  Hand off to the patient's nurse,back into her room with stable condition.

## 2024-02-02 NOTE — Progress Notes (Signed)
 Pt continues to decline/refuse sacral dressing change. Pt drowsy at this time, will attempt later in am if possible. MD aware

## 2024-02-02 NOTE — Plan of Care (Signed)
  Problem: Education: Goal: Ability to describe self-care measures that may prevent or decrease complications (Diabetes Survival Skills Education) will improve Outcome: Progressing Goal: Individualized Educational Video(s) Outcome: Progressing   Problem: Coping: Goal: Ability to adjust to condition or change in health will improve Outcome: Progressing   Problem: Metabolic: Goal: Ability to maintain appropriate glucose levels will improve Outcome: Progressing   Problem: Nutritional: Goal: Maintenance of adequate nutrition will improve Outcome: Progressing Goal: Progress toward achieving an optimal weight will improve Outcome: Progressing   Problem: Skin Integrity: Goal: Risk for impaired skin integrity will decrease Outcome: Progressing

## 2024-02-02 NOTE — Plan of Care (Signed)
  Problem: Education: Goal: Ability to describe self-care measures that may prevent or decrease complications (Diabetes Survival Skills Education) will improve Outcome: Progressing   Problem: Coping: Goal: Ability to adjust to condition or change in health will improve Outcome: Progressing   Problem: Health Behavior/Discharge Planning: Goal: Ability to identify and utilize available resources and services will improve Outcome: Progressing   Problem: Nutritional: Goal: Maintenance of adequate nutrition will improve Outcome: Progressing   Problem: Education: Goal: Knowledge of General Education information will improve Description: Including pain rating scale, medication(s)/side effects and non-pharmacologic comfort measures Outcome: Progressing

## 2024-02-03 DIAGNOSIS — M4628 Osteomyelitis of vertebra, sacral and sacrococcygeal region: Secondary | ICD-10-CM | POA: Diagnosis not present

## 2024-02-03 LAB — CBC
HCT: 31.2 % — ABNORMAL LOW (ref 36.0–46.0)
Hemoglobin: 9.7 g/dL — ABNORMAL LOW (ref 12.0–15.0)
MCH: 22.6 pg — ABNORMAL LOW (ref 26.0–34.0)
MCHC: 31.1 g/dL (ref 30.0–36.0)
MCV: 72.7 fL — ABNORMAL LOW (ref 80.0–100.0)
Platelets: 479 K/uL — ABNORMAL HIGH (ref 150–400)
RBC: 4.29 MIL/uL (ref 3.87–5.11)
RDW: 21.9 % — ABNORMAL HIGH (ref 11.5–15.5)
WBC: 20.2 K/uL — ABNORMAL HIGH (ref 4.0–10.5)
nRBC: 0 % (ref 0.0–0.2)

## 2024-02-03 LAB — GLUCOSE, CAPILLARY
Glucose-Capillary: 296 mg/dL — ABNORMAL HIGH (ref 70–99)
Glucose-Capillary: 270 mg/dL — ABNORMAL HIGH (ref 70–99)

## 2024-02-03 MED ORDER — OXYCODONE HCL 5 MG PO TABS: 5.0000 mg | ORAL_TABLET | ORAL | 0 refills | Status: AC | PRN

## 2024-02-03 MED ORDER — OXYCODONE HCL 5 MG PO TABS: 5.0000 mg | ORAL_TABLET | ORAL | 0 refills | Status: DC | PRN

## 2024-02-03 MED ORDER — VITAMIN A 3 MG (10000 UNIT) PO CAPS
10000.0000 [IU] | ORAL_CAPSULE | Freq: Every day | ORAL | Status: DC
  Administered 2024-02-03: 10000 [IU] via ORAL
  Filled 2024-02-03: qty 1

## 2024-02-03 MED ORDER — VITAMIN E 45 MG (100 UNIT) PO CAPS: 100.0000 [IU] | ORAL_CAPSULE | Freq: Every day | ORAL | 0 refills | Status: DC

## 2024-02-03 MED ORDER — VITAMIN E 45 MG (100 UNIT) PO CAPS
100.0000 [IU] | ORAL_CAPSULE | Freq: Every day | ORAL | Status: DC
  Administered 2024-02-03: 100 [IU] via ORAL
  Filled 2024-02-03: qty 1

## 2024-02-03 MED ORDER — SACCHAROMYCES BOULARDII 250 MG PO CAPS: 250.0000 mg | ORAL_CAPSULE | Freq: Two times a day (BID) | ORAL | 0 refills | Status: DC

## 2024-02-03 MED ORDER — AMOXICILLIN-POT CLAVULANATE 500-125 MG PO TABS: 1.0000 | ORAL_TABLET | Freq: Two times a day (BID) | ORAL | 0 refills | Status: AC

## 2024-02-03 MED ORDER — DOXYCYCLINE HYCLATE 100 MG PO TABS: 100.0000 mg | ORAL_TABLET | Freq: Two times a day (BID) | ORAL | 0 refills | Status: AC

## 2024-02-03 MED ORDER — VITAMIN A 3 MG (10000 UNIT) PO CAPS: 10000.0000 [IU] | ORAL_CAPSULE | Freq: Every day | ORAL | 0 refills | Status: DC

## 2024-02-03 MED ORDER — LOSARTAN POTASSIUM 50 MG PO TABS: 50.0000 mg | ORAL_TABLET | Freq: Every day | ORAL | 0 refills | Status: DC

## 2024-02-03 NOTE — Plan of Care (Signed)
   Problem: Education: Goal: Ability to describe self-care measures that may prevent or decrease complications (Diabetes Survival Skills Education) will improve Outcome: Progressing

## 2024-02-03 NOTE — Discharge Instructions (Addendum)
 Advised to follow-up with primary care physician in 1 week. Advised to continue hemodialysis as per schedule. Advised to take Augmentin  and doxycycline  as per ID for next 3 days to complete 2 weeks treatment for sacral osteomyelitis.am

## 2024-02-03 NOTE — Progress Notes (Signed)
 Received the santyl  from pharmacy, pt declined dressing change to L BKA at this time.

## 2024-02-03 NOTE — Progress Notes (Signed)
 Patient complains of itchiness. Informed Lavanda Hipp NP. Ordered Atarax  to be given now.

## 2024-02-03 NOTE — Discharge Planning (Signed)
 [facility] Patient Discharge Orders - [facility] CLINIC: GOC  Patient's name: [name] [name] Admit/DC Dates: 01/24/2024 - 02/03/24  DISCHARGE DIAGNOSES: Sacral osteomyelitis - no surgical plans for now BP variability - amlodipine  held initially, resumed on discharge  HD ORDER CHANGES: Heparin  change: no EDW Change: YES New EDW: 59kg Bath Change: no  ANEMIA MANAGEMENT: Aranesp : Given: no    ESA dose for discharge: mircera 75 mcg IV q 2 weeks, to start on 02/04/24 IV Iron  dose at discharge: per protocol Transfusion: Given: no  BONE/MINERAL MEDICATIONS: Hectorol/Calcitriol  change: no Sensipar/Parsabiv change: no  ACCESS INTERVENTION/CHANGE: no Details:  RECENT LABS: Recent Labs  Lab 02/02/24 0500 02/03/24 0713  HGB 8.6* 9.7*  NA 134*  --   K 4.6  --   CALCIUM  7.3*  --   PHOS 3.4  --   ALBUMIN  <1.5*  --     IV ANTIBIOTICS: no Details: Will be finishing course of PO Augmentin  + Doxycycline   OTHER ANTICOAGULATION: no  OTHER/APPTS/LAB ORDERS: - Check K weekly while on 3K bath - Make sure getting protein supplements, Alb extremely low - D/c back to SNF  D/C Meds to be reconciled by nurse after every discharge.  Completed By: Izetta Boehringer, PA-C [facility] Pager [phone]   Reviewed by: MD:______ RN_______

## 2024-02-03 NOTE — Progress Notes (Signed)
 [facility] Progress Note   Subjective:  Seen in room. Denies CP/dyspnea. + sacral pain. Looks like plan is to d/c back to SNF as early as today.  Objective Vitals:   02/02/24 1239 02/02/24 1707 02/02/24 1947 02/03/24 0850  BP: (!) 145/66 (!) 161/73 122/64 (!) 155/68  Pulse: 79 88 81 82  Resp: 17 17 16 17   Temp: 98.1 F (36.7 C) 97.6 F (36.4 C) 99.4 F (37.4 C) 97.7 F (36.5 C)  TempSrc: Oral  Oral   SpO2: 100% 100% 100% 100%  Weight:      Height:       Physical Exam General: Frail woman, NAD Heart: RRR Lungs: CTA anteriorly Abdomen: soft Extremities: 1+ BLE edema Dialysis Access: [facility]  Additional Objective Labs: Basic Metabolic Panel: Recent Labs  Lab 01/30/24 0742 01/31/24 0656 02/02/24 0500  NA 134* 130* 134*  K 4.2 4.7 4.6  CL 96* 95* 100  CO2 23 21* 25  GLUCOSE 288* 343* 247*  BUN 43* 53* 39*  CREATININE 3.62* 3.91* 3.74*  CALCIUM  7.6* 7.6* 7.3*  PHOS  --  3.4 3.4   Liver Function Tests: Recent Labs  Lab 01/29/24 0716 01/30/24 0742 01/31/24 0656 02/02/24 0500  AST 20 18  --  15  ALT 7 10  --  7  ALKPHOS 118 115  --  141*  BILITOT 0.9 0.4  --  0.5  PROT 6.4* 6.2*  --  5.9*  ALBUMIN  1.6* 1.6* 1.6* <1.5*   CBC: Recent Labs  Lab 01/28/24 0803 01/29/24 0716 01/30/24 0742 02/02/24 0500 02/03/24 0713  WBC 22.1* 27.1* 20.2* 18.6* 20.2*  NEUTROABS 16.6* 20.3* 12.3*  --   --   HGB 8.4* 9.0* 9.1* 8.6* 9.7*  HCT 26.9* 29.4* 29.3* 27.6* 31.2*  MCV 74.3* 73.7* 73.3* 73.4* 72.7*  PLT 377 373 450* 527* 479*   Blood Culture    Component Value Date/Time   SDES BLOOD LEFT HAND 01/30/2024 1011   SPECREQUEST  01/30/2024 1011    BOTTLES DRAWN AEROBIC ONLY Blood Culture adequate volume   CULT  01/30/2024 1011    NO GROWTH 4 DAYS Performed at [facility], [address] [place], [place] [address]    REPTSTATUS PENDING 01/30/2024 1011   Medications:   amoxicillin -clavulanate  1 tablet Oral BID   apixaban   5 mg Oral BID    ascorbic acid   1,000 mg Oral Daily   atorvastatin   20 mg Oral Daily   calcitRIOL   0.25 mcg Oral Q T,Th,Sa-HD   Chlorhexidine  Gluconate Cloth  6 each Topical Q0600   collagenase    Topical Daily   doxycycline   100 mg Oral Q12H   feeding supplement (KATE FARMS STANDARD 1.4)  325 mL Oral BID BM   gabapentin   100 mg Oral TID   hydrOXYzine   10 mg Oral TID   insulin  aspart  0-5 Units Subcutaneous QHS   insulin  aspart  0-9 Units Subcutaneous TID WC   insulin  aspart  3 Units Subcutaneous TID WC   insulin  glargine-yfgn  5 Units Subcutaneous Daily   lidocaine   1 patch Transdermal See admin instructions   losartan   50 mg Oral Daily   multivitamin  1 tablet Oral Q2000   nutrition supplement (JUVEN)  1 packet Oral BID BM   pantoprazole   40 mg Oral Daily   saccharomyces boulardii  250 mg Oral BID   vitamin A   10,000 Units Oral Daily   vitamin E  100 Units Oral Daily   zinc   sulfate (50mg  elemental zinc )  220 mg Oral Daily    Dialysis Orders TTS - GO 3:45hr, 350/600, EDW 60kg, 3K/2.5Ca, TDC, heparin  2000 3h   B350   60.3 kg   TDC   Heparin  2000 - calcitriol  0.54mcg PO q HD - Mircera 225mcg IV q 2 weeks - last 7/17   Assessment/Plan: Sacral osteomyelitis: Per CT, on IV abx for now. No surgical plans for now. WBC remains up.  ESRD: on HD TTS - next HD tomorrow, possibly as outpatient. HTN/volume: BP low to start, MTP and amlodipine  held. Now BP high with edema. UF as tolerated with HD - keep lowering EDW until edema resolved.  Anemia of ESRD: Hgb 9.7 - resume ESA as outpatient. Dispo - Patient reports she's not happy at current SNF and wants to be transferred. Discussed with Hospitalist, SW, and renal navigator. I was informed that she will able to discuss this matter with the SW at her current SNF. Okay for discharge from a renal standpoint.     Izetta Boehringer, PA-C 02/03/2024, 11:56 AM  [employer]

## 2024-02-03 NOTE — Progress Notes (Signed)
 Report called to [place] given to [employer].

## 2024-02-03 NOTE — Progress Notes (Signed)
 D/C order noted. Contacted [facility] to be advised of pt's d/c today and that pt should resume care tomorrow.   Randine Mungo Dialysis Navigator [phone]

## 2024-02-03 NOTE — Progress Notes (Signed)
 Nutrition Follow-up  DOCUMENTATION CODES:   Not applicable  INTERVENTION:   Encourage PO intake - Currently on regular diet with 1200 ml fluid restriction  Room service with assist  Assist with feeding pt  Continue 1 packet Juven BID to support wound healing, each packet provides 95 calories, 2.5 grams of protein, and 9.8 grams of carbohydrate Continue supplementation with vitamin C  x 30 days  and zinc  x 15 days fpr wound healing  Continue renal MVI Continue [employer] 1.4 PO BID, each supplement provides 455 kcal and 20 grams protein. Recommend 50,000 ergocalciferol  once weekly for 8 weeks for deficiency  Recommend 10,000 units Vitamin A  daily for 2 weeks for deficiency Recommend 100 units Vitamin E daily for 2 weeks for deficiency   Discontinue Prosource Plus as pt is refusing   NUTRITION DIAGNOSIS:   Increased nutrient needs related to wound healing as evidenced by estimated needs. - Still applicable   GOAL:   Patient will meet greater than or equal to 90% of their needs - Progressing   MONITOR:   PO intake, Supplement acceptance, Labs, Weight trends, Skin, I & O's  REASON FOR ASSESSMENT:   Consult Wound healing  ASSESSMENT:   58 year old female who presented to the ED on 01/24/24 with concern for vaginal bleeding. PMH of T2DM, ESRD on iHD, GERD, chronic anemia, mitral regurgitation, recent admission for infected diabetic foot ulcer s/p L BKA, L eye blindness, HTN, CVA. Pt admitted with infected large sacral decubitus ulcer with possible sacral osteomyelitis and proctocolitis.  11/25/2023 L BKA    Pt sitting in bed breakfast tray at bedside. Pt had 50% of an omelet and 50% of toast for breakfast today. Pt states she would eat more if she had assistance. Spoke to nurse tech who reports they offer to help but pt says no. Recommended to check in on pt 20 min after meal delivered and offer to help pt then as it seems pt gets tired of feeding herself during meals.   Pt in  HD all day yesterday. Did not have breakfast yesterday but did have 75% of her lunch, for dinner she only had bites of mashed potatoes because she did not like what she was given. Pt's intake has improved since admission going from 30% of meals to 75-100% of meals consumed. Pt is a picky eater at baseline and does not like much of the food here, will eat well if she gets what she wants. Pt is room service with assist so she should be choosing what she wants to eat, RD also went over menu items with pt.   Pt states she is taking the Juven and Mallie Pinion however it is documented that pt is refusing these most days.  RD encouraged continuing to take these supplements as they weill help with wound healing. Encouraged increased PO intake.   Pt refused appetite stimulant and cortrak feeding tube. No noted nausea or vomiting, no abdominal pain. RD checked Vitamin labs A, C, D, E, and Zinc  due to nonhealing wounds. Vitamins A, D, and E low. Pt to D/C today, recommend supplementation Zinc  WNL. Vitamin C  not resulted yet.   Admit weight: 59.9 kg Current weight: 60 kg EDW: 61 kg - Per nephrology EDW is now lower   Average Meal Intake: 7/24: 30% x 2 meals 7/25: 20% x 1 meal 7/26: 10% x 1 meal 7/27: 75% x 1 meal 7/28: 30% x 1 meal  7/31: 75% x 1 meal 8/1: 75% x 1 meal  Intake/Output Summary (Last 24 hours) at 02/03/2024 1115 Last data filed at 02/03/2024 0900 Gross per 24 hour  Intake 720 ml  Output 500 ml  Net 220 ml    Nutritionally Relevant Medications: Scheduled Meds:  amoxicillin -clavulanate  1 tablet Oral BID   apixaban   5 mg Oral BID   ascorbic acid   1,000 mg Oral Daily   feeding supplement ([employer] STANDARD 1.4)  325 mL Oral BID BM   insulin  aspart  0-5 Units Subcutaneous QHS   insulin  aspart  0-9 Units Subcutaneous TID WC   insulin  aspart  3 Units Subcutaneous TID WC   insulin  glargine-yfgn  5 Units Subcutaneous Daily   multivitamin  1 tablet Oral Q2000   nutrition supplement  (JUVEN)  1 packet Oral BID BM   vitamin A   10,000 Units Oral Daily   vitamin E  100 Units Oral Daily   zinc  sulfate (50mg  elemental zinc )  220 mg Oral Daily   Labs Reviewed: Vitamin D  25.85  Vitamin A  11.4  Vitamin E 6.4 Sodium 134 BUN 39 Creatinine 3.74 Calcium  7.3 AP 141 Albumin  <1.5 Total protein 5.9 GFR 13 CBG ranges from 65-296 mg/dL over the last 24 hours HgbA1c 7.1  Diet Order:   Diet Order             Diet - low sodium heart healthy           Diet general           Diet regular Room service appropriate? Yes with Assist; Fluid consistency: Thin; Fluid restriction: 1200 mL Fluid  Diet effective now                   EDUCATION NEEDS:   Not appropriate for education at this time  Skin:  Skin Assessment: Skin Integrity Issues: Skin Integrity Issues:: Stage III, Other (Comment) Stage III: Sacrum Other: L knee infection  Last BM:  02/02/2024, type 5  Height:   Ht Readings from Last 1 Encounters:  01/24/24 5' (1.524 m)    Weight:   Wt Readings from Last 1 Encounters:  01/31/24 60 kg    Ideal Body Weight:  45.5 kg  BMI:  Body mass index is 25.83 kg/m.  Estimated Nutritional Needs:   Kcal:  1800-2000 Kcal  Protein:  85-105 gm  Fluid:  1L + UOP   Olivia Kenning, RD Registered Dietitian  See Amion for more information

## 2024-02-03 NOTE — Discharge Summary (Addendum)
 Physician Discharge Summary  [name] [name] FMW:[id number] DOB: 1966-05-02 DOA: 01/24/2024  PCP: Patient, No Pcp Per  Admit date: 01/24/2024  Discharge date: 02/03/2024  Admitted From: Home  Disposition:  SNF  Recommendations for Outpatient Follow-up:  Follow up with PCP in 1-2 weeks. Please obtain BMP/CBC in one week. Advised to continue hemodialysis as per schedule.. Advised to take Augmentin  and doxycycline  as per ID for next 3 days to complete 2 weeks treatment for sacral osteomyelitis.  Home Health:None Equipment/Devices:None  Discharge Condition: Stable CODE STATUS:Full code Diet recommendation: Heart Healthy   [facility]rief [facility] Course: This 58 yrs old Female with PMH significant of ESRD on HD TTS, Anemia of CKD, type II DM, neuropathy, left eye blindness, HTN, GERD, lumbar radiculopathy, left BKA, CVA presented to the hospital with complaints of vaginal bleeding. She is Found to have sacral osteomyelitis with concern for bleeding coming from that site. Currently receiving antibiotics.  ID consulted.  Nephrology consulted for continuation of hemodialysis.  ID consulted recommended patient can be discharged on oral doxycycline  and Augmentin  for total 2 weeks duration.  Patient has made significant improvement.  Continued on hemodialysis.  Patient will resume hemodialysis as  an outpatient.  PT and OT recommended skilled nursing facility.  Insurance authorization was approved.  Patient is being discharged to SNF.  Discharge Diagnoses:  Principal Problem:   Sacral osteomyelitis (HCC) Active Problems:   Constipation   ESRD (end stage renal disease) (HCC)   Type 2 diabetes mellitus (HCC)   Proctocolitis  Sacral osteomyelitis: Intermittent fever: CT scan of the abdomen and pelvis shows evidence of sacral osteomyelitis, some gas. General surgery was consulted,  recommends no intervention or indication for I&D. Initiated on IV vancomycin  and Zosyn . Unfortunately no blood  cultures performed at the time of the admission. Repeat culture performed on 7/24.  No growth so far. ID consulted.  Recommended oral doxycycline  and Augmentin  x 2 weeks. Repeat blood cultures ordered due to intermittent fever. NGTD   Proctocolitis: Severe constipation: Constipation resolved. Continue current antibiotics. Continue current bowel regimen.   Vaginal bleeding:  Ruled out. Seen by GYN outpatient.  No vaginal bleeding. Continue Eliquis  for now. So far no bleeding reported. H&H relatively stable as well.   Possible cystitis: Continue IV antibiotics Outpatient follow-up.   ESRD on HD. TTS schedule. Appreciate nephrology consultation.   Type II DM with peripheral neuropathy: Last hemoglobin A1c 7.1. Continue sliding scale insulin . Continue gabapentin .   Anemia of CKD as well as chronic disease in the setting of osteomyelitis: H&H relatively stable.  Continue to monitor H&H. Also reports that she has vaginal bleeding which most likely bleeding from the sacral ulcer. For now we will continue to monitor H&H.   Intractable nausea and vomiting. Resolved.   Poor p.o. intake. Patient with ongoing poor p.o. intake including during last admission. Most likely resulting in her debility and sacral ulcer. Continue nutritional supplementation. Discussed and offered appetite stimulant medication although patient currently wants to hold off on that.   Discharge Instructions  Discharge Instructions     Call MD for:  difficulty breathing, headache or visual disturbances   Complete by: As directed    Call MD for:  persistant dizziness or light-headedness   Complete by: As directed    Call MD for:  persistant nausea and vomiting   Complete by: As directed    Diet - low sodium heart healthy   Complete by: As directed    Diet general   Complete by: As  directed    Discharge instructions   Complete by: As directed    Advised to follow-up with primary care physician in 1  week. Advised to continue hemodialysis as per schedule. Advised to take Augmentin  and doxycycline  as per ID for next 3 days to complete 2 weeks treatment for sacral osteomyelitis.   Discharge wound care:   Complete by: As directed    Follow-up with wound care at nursing home.   Increase activity slowly   Complete by: As directed       Allergies as of 02/03/2024       Reactions   Amitriptyline  Other (See Comments), Swelling, Nausea And Vomiting   Hypotension, Mental Status Changes, Confusion - Allergic, per [facility]   Ciprofloxacin  Diarrhea, Nausea And Vomiting, Other (See Comments)   Severe vomiting requiring ED visit, IV reglan  and IV fluids- Allergic, per [facility]   Ibuprofen  Itching   Aspirin  Itching, Other (See Comments)   Pt endorses getting itchy in the past with aspirin . Tolerated dose 04/06/22.   Na Ferric Gluc Cplx In Sucrose Other (See Comments)   Acetaminophen  Itching, Other (See Comments), Nausea And Vomiting   Allergic, per [facility]   Semaglutide Nausea Only, Other (See Comments)   Allergic, per [facility]   Tramadol  Itching        Medication List     PAUSE taking these medications    metoprolol  tartrate 50 MG tablet Wait to take this until: February 06, 2024 Commonly known as: LOPRESSOR  Take 1 tablet (50 mg total) by mouth 2 (two) times daily.       STOP taking these medications    metroNIDAZOLE  500 MG tablet Commonly known as: FLAGYL        TAKE these medications    amLODipine  5 MG tablet Commonly known as: NORVASC  Take 1 tablet (5 mg total) by mouth every evening.   amoxicillin -clavulanate 500-125 MG tablet Commonly known as: AUGMENTIN  Take 1 tablet by mouth 2 (two) times daily for 4 days.   ascorbic acid  1000 MG tablet Commonly known as: VITAMIN C  Take 1 tablet (1,000 mg total) by mouth daily.   atorvastatin  20 MG tablet Commonly known as: LIPITOR Take 1 tablet (20 mg total) by mouth daily.   calcitRIOL  0.25 MCG capsule Commonly known as:  ROCALTROL  Take 1 capsule (0.25 mcg total) by mouth Every Tuesday,Thursday,and Saturday with dialysis.   calcium  carbonate 500 MG chewable tablet Commonly known as: Tums Chew 1 tablet (200 mg of elemental calcium  total) by mouth 3 (three) times daily with meals.   cloNIDine  0.3 mg/24hr patch Commonly known as: CATAPRES  - Dosed in mg/24 hr Place 0.3 mg onto the skin every Wednesday.   doxycycline  100 MG tablet Commonly known as: VIBRA -TABS Take 1 tablet (100 mg total) by mouth every 12 (twelve) hours for 4 days.   Eliquis  5 MG Tabs tablet Generic drug: apixaban  Take 5 mg by mouth 2 (two) times daily.   ferrous sulfate 325 (65 FE) MG EC tablet Take 325 mg by mouth in the morning and at bedtime.   fluticasone 50 MCG/ACT nasal spray Commonly known as: FLONASE Place 2 sprays into both nostrils in the morning and at bedtime.   gabapentin  100 MG capsule Commonly known as: NEURONTIN  Take 1 capsule (100 mg total) by mouth 3 (three) times daily. For 1 week take 1 capsule by mouth at bedtime, following week twice daily, following week 3 times daily   hydrOXYzine  10 MG tablet Commonly known as: ATARAX  Take 10 mg by mouth 3 (  three) times daily.   isosorbide  mononitrate 30 MG 24 hr tablet Commonly known as: IMDUR  Take 30 mg by mouth See admin instructions. Take 30 mg by mouth at 8 AM on Sun/Mon/Wed/Fri   lidocaine  5 % Commonly known as: LIDODERM  Place 1 patch onto the skin See admin instructions. Apply 1 new patch to bilateral feet at 8 AM daily- Remove & Discard patch within 12 hours or as directed by MD   losartan  50 MG tablet Commonly known as: COZAAR  Take 1 tablet (50 mg total) by mouth daily. Start taking on: February 04, 2024   multivitamin Tabs tablet Take 1 tablet by mouth daily at 8 pm.   nystatin cream Commonly known as: MYCOSTATIN Apply 1 Application topically See admin instructions. Apply to sacrum 2 times a day after cleansing ab=nd drying. Apply collagen particles  after nystatin cream.   omeprazole  40 MG capsule Commonly known as: PRILOSEC Take 40 mg by mouth in the morning and at bedtime.   ondansetron  4 MG tablet Commonly known as: Zofran  Take 1 tablet (4 mg total) by mouth every 8 (eight) hours as needed for nausea or vomiting.   oxyCODONE  5 MG immediate release tablet Commonly known as: Oxy IR/ROXICODONE  Take 1 tablet (5 mg total) by mouth every 4 (four) hours as needed for up to 3 days for moderate pain (pain score 4-6) or severe pain (pain score 7-10).   PROTEIN PO Take 30 mLs by mouth 2 (two) times daily.   saccharomyces boulardii 250 MG capsule Commonly known as: FLORASTOR Take 1 capsule (250 mg total) by mouth 2 (two) times daily.   thiamine  100 MG tablet Commonly known as: Vitamin B-1 Take 1 tablet (100 mg total) by mouth daily.   vitamin A  3 MG (10000 UNITS) capsule Take 1 capsule (10,000 Units total) by mouth daily.   vitamin E 45 MG (100 UNITS) capsule Take 1 capsule (100 Units total) by mouth daily.   zinc  sulfate (50mg  elemental zinc ) 220 (50 Zn) MG capsule Take 1 capsule (220 mg total) by mouth daily.               Discharge Care Instructions  (From admission, onward)           Start     Ordered   02/03/24 0000  Discharge wound care:       Comments: Follow-up with wound care at nursing home.   02/03/24 1039            Follow-up Information     [facility] Follow up in 1 week(s).   Specialty: Family Medicine Contact information: [address] [facility] [place]  [address] [phone] Additional information: [address]  [address]  [place], [place] [address]               Allergies  Allergen Reactions   Amitriptyline  Other (See Comments), Swelling and Nausea And Vomiting    Hypotension, Mental Status Changes, Confusion - Allergic, per [facility]   Ciprofloxacin  Diarrhea, Nausea And Vomiting and Other  (See Comments)    Severe vomiting requiring ED visit, IV reglan  and IV fluids- Allergic, per [facility]   Ibuprofen  Itching   Aspirin  Itching and Other (See Comments)    Pt endorses getting itchy in the past with aspirin . Tolerated dose 04/06/22.   Na Ferric Gluc Cplx In Sucrose Other (See Comments)   Acetaminophen  Itching, Other (See Comments) and Nausea And Vomiting    Allergic, per  [facility]   Semaglutide Nausea Only and Other (See Comments)    Allergic, per [facility]   Tramadol  Itching    Consultations: Nephrology   Procedures/Studies: DG CHEST PORT 1 VIEW Result Date: 01/27/2024 CLINICAL DATA:  [id number] Fever [id number] EXAM: PORTABLE CHEST - 1 VIEW COMPARISON:  July 13, 2023 FINDINGS: Mild elevation of the right hemidiaphragm. Left hemodialysis catheter in place terminating in the right atrium. Subsegmental atelectasis in both lung bases. No focal airspace consolidation, pleural effusion, or pneumothorax. No cardiomegaly. No acute fracture or destructive lesion. Cholecystectomy clips. IMPRESSION: 1. Subsegmental atelectasis in both lung bases. No acute cardiopulmonary abnormality. 2. Similarly positioned left hemodialysis catheter terminating in the right atrium. Electronically Signed   By: Rogelia Myers M.D.   On: 01/27/2024 12:38   DG Abd Portable 1V Result Date: 01/27/2024 CLINICAL DATA:  Nausea vomiting. EXAM: PORTABLE ABDOMEN - 1 VIEW COMPARISON:  Abdominal radiograph dated 03/12/2020. FINDINGS: Moderate stool in the rectal vault. No bowel dilatation or evidence of obstruction. No free air. Right upper quadrant cholecystectomy clips. No acute osseous pathology. Degenerative changes of the spine and hips. Right femoral neck fixation screws. IMPRESSION: Nonobstructive bowel gas pattern. Electronically Signed   By: Vanetta Chou M.D.   On: 01/27/2024 12:29   CT ABDOMEN PELVIS WO CONTRAST Result Date: 01/24/2024 CLINICAL DATA:  Acute generalized abdominal pain. EXAM: CT ABDOMEN AND PELVIS WITHOUT  CONTRAST TECHNIQUE: Multidetector CT imaging of the abdomen and pelvis was performed following the standard protocol without IV contrast. RADIATION DOSE REDUCTION: This exam was performed according to the departmental dose-optimization program which includes automated exposure control, adjustment of the mA and/or kV according to patient size and/or use of iterative reconstruction technique. COMPARISON:  Dec 01, 2022. FINDINGS: Lower chest: No acute abnormality. Hepatobiliary: No focal liver abnormality is seen. Status post cholecystectomy. No biliary dilatation. Pancreas: Unremarkable. No pancreatic ductal dilatation or surrounding inflammatory changes. Spleen: Normal in size without focal abnormality. Adrenals/Urinary Tract: Adrenal glands appear normal. Bilateral renal atrophy is noted. Mild bilateral hydroureteronephrosis is noted without obstructing calculus. There remains significant wall thickening of the anterior and superior portion of urinary bladder which may represent cystitis, but neoplasm cannot be excluded. Stomach/Bowel: The stomach is unremarkable. Large amount of stool is seen in the transverse, descending and sigmoid colon. Moderate wall thickening of distal sigmoid colon and rectum is noted suggesting proctocolitis. Vascular/Lymphatic: Aortic atherosclerosis. No enlarged abdominal or pelvic lymph nodes. Reproductive: Status post hysterectomy. No adnexal masses. Other: There appears to be interval development large sacral decubitus ulceration on the left side. Some gas is seen in the musculature of the gluteal region on the right suggesting infection. Musculoskeletal: Some degree of osteomyelitis involving the sacrum due to adjacent decubitus ulceration cannot be excluded. IMPRESSION: Interval development of large sacral decubitus ulceration is seen in the left side, with some gas extending into the musculature of the right gluteal region suggesting infection. Some degree of osteomyelitis involving  the underlying sacrum cannot be excluded. Bilateral renal atrophy is noted. Mild bilateral hydroureteronephrosis is noted without obstructing calculus. There remains significant wall thickening involving the anterior and superior aspect of the urinary bladder which may represent cystitis, but neoplasm cannot be excluded. Large amount of stool is seen involving the distal portions of the colon. Moderate wall thickening of sigmoid colon and rectum is noted concerning for proctocolitis. Aortic Atherosclerosis (ICD10-I70.0). Electronically Signed   By: Lynwood Landy Raddle M.D.   On: 01/24/2024 17:49     Subjective: Patient was seen and examined  at bedside.  Overnight events noted. Patient reports doing much better and wants to be discharged to  skilled nursing facility today.  Discharge Exam: Vitals:   02/02/24 1947 02/03/24 0850  BP: 122/64 (!) 155/68  Pulse: 81 82  Resp: 16 17  Temp: 99.4 F (37.4 C) 97.7 F (36.5 C)  SpO2: 100% 100%   Vitals:   02/02/24 1239 02/02/24 1707 02/02/24 1947 02/03/24 0850  BP: (!) 145/66 (!) 161/73 122/64 (!) 155/68  Pulse: 79 88 81 82  Resp: 17 17 16 17   Temp: 98.1 F (36.7 C) 97.6 F (36.4 C) 99.4 F (37.4 C) 97.7 F (36.5 C)  TempSrc: Oral  Oral   SpO2: 100% 100% 100% 100%  Weight:      Height:        General: Pt is alert, awake, not in acute distress Cardiovascular: RRR, S1/S2 +, no rubs, no gallops Respiratory: CTA bilaterally, no wheezing, no rhonchi Abdominal: Soft, NT, ND, bowel sounds + Extremities: no edema, no cyanosis, Lt BKA.    The results of significant diagnostics from this hospitalization (including imaging, microbiology, ancillary and laboratory) are listed below for reference.     Microbiology: Recent Results (from the past 240 hours)  Culture, blood (Routine X 2) w Reflex to ID Panel     Status: None   Collection Time: 01/26/24  6:54 PM   Specimen: BLOOD  Result Value Ref Range Status   Specimen Description BLOOD RIGHT  ANTECUBITAL  Final   Special Requests   Final    BOTTLES DRAWN AEROBIC AND ANAEROBIC Blood Culture results may not be optimal due to an inadequate volume of blood received in culture bottles   Culture   Final    NO GROWTH 5 DAYS Performed at [facility], [address], [place], [place] [address]    Report Status 01/31/2024 FINAL  Final  Culture, blood (Routine X 2) w Reflex to ID Panel     Status: None   Collection Time: 01/26/24  7:02 PM   Specimen: BLOOD  Result Value Ref Range Status   Specimen Description BLOOD SITE NOT SPECIFIED  Final   Special Requests   Final    BOTTLES DRAWN AEROBIC ONLY Blood Culture results may not be optimal due to an inadequate volume of blood received in culture bottles   Culture   Final    NO GROWTH 5 DAYS Performed at [facility], [address] [place], [place] [address]    Report Status 01/31/2024 FINAL  Final  Culture, blood (Routine X 2) w Reflex to ID Panel     Status: None (Preliminary result)   Collection Time: 01/30/24 10:07 AM   Specimen: BLOOD RIGHT HAND  Result Value Ref Range Status   Specimen Description BLOOD RIGHT HAND  Final   Special Requests   Final    BOTTLES DRAWN AEROBIC ONLY Blood Culture results may not be optimal due to an inadequate volume of blood received in culture bottles   Culture   Final    NO GROWTH 4 DAYS Performed at [facility], [address], [place], [place] [address]    Report Status PENDING  Incomplete  Culture, blood (Routine X 2) w Reflex to ID Panel     Status: None (Preliminary result)   Collection Time: 01/30/24 10:11 AM   Specimen: BLOOD LEFT HAND  Result Value Ref Range Status   Specimen Description BLOOD LEFT HAND  Final   Special Requests  Final    BOTTLES DRAWN AEROBIC ONLY Blood Culture adequate volume   Culture   Final    NO GROWTH 4 DAYS Performed at [facility], [address], [place], [place] [address]    Report Status PENDING  Incomplete     Labs: BNP  (last 3 results) No results for input(s): BNP in the last 8760 hours. Basic Metabolic Panel: Recent Labs  Lab 01/28/24 0803 01/29/24 0716 01/30/24 0742 01/31/24 0656 02/02/24 0500  NA 131* 135 134* 130* 134*  K 3.7 4.3 4.2 4.7 4.6  CL 97* 98 96* 95* 100  CO2 22 21* 23 21* 25  GLUCOSE 167* 204* 288* 343* 247*  BUN 24* 21* 43* 53* 39*  CREATININE 3.62* 2.75* 3.62* 3.91* 3.74*  CALCIUM  7.5* 7.7* 7.6* 7.6* 7.3*  MG 1.6* 2.1 2.0  --  1.8  PHOS  --   --   --  3.4 3.4   Liver Function Tests: Recent Labs  Lab 01/28/24 0803 01/29/24 0716 01/30/24 0742 01/31/24 0656 02/02/24 0500  AST 16 20 18   --  15  ALT 8 7 10   --  7  ALKPHOS 112 118 115  --  141*  BILITOT 1.1 0.9 0.4  --  0.5  PROT 6.0* 6.4* 6.2*  --  5.9*  ALBUMIN  1.5* 1.6* 1.6* 1.6* <1.5*   No results for input(s): LIPASE, AMYLASE in the last 168 hours. No results for input(s): AMMONIA in the last 168 hours. CBC: Recent Labs  Lab 01/28/24 0803 01/29/24 0716 01/30/24 0742 02/02/24 0500 02/03/24 0713  WBC 22.1* 27.1* 20.2* 18.6* 20.2*  NEUTROABS 16.6* 20.3* 12.3*  --   --   HGB 8.4* 9.0* 9.1* 8.6* 9.7*  HCT 26.9* 29.4* 29.3* 27.6* 31.2*  MCV 74.3* 73.7* 73.3* 73.4* 72.7*  PLT 377 373 450* 527* 479*   Cardiac Enzymes: No results for input(s): CKTOTAL, CKMB, CKMBINDEX, TROPONINI in the last 168 hours. BNP: Invalid input(s): POCBNP CBG: Recent Labs  Lab 02/02/24 1339 02/02/24 1709 02/02/24 1949 02/03/24 0851 02/03/24 1156  GLUCAP 94 268* 223* 296* 270*   D-Dimer No results for input(s): DDIMER in the last 72 hours. Hgb A1c No results for input(s): HGBA1C in the last 72 hours. Lipid Profile No results for input(s): CHOL, HDL, LDLCALC, TRIG, CHOLHDL, LDLDIRECT in the last 72 hours. Thyroid  function studies No results for input(s): TSH, T4TOTAL, T3FREE, THYROIDAB in the last 72 hours.  Invalid input(s): FREET3 Anemia work up No results for input(s):  VITAMINB12, FOLATE, FERRITIN, TIBC, IRON , RETICCTPCT in the last 72 hours. Urinalysis    Component Value Date/Time   COLORURINE STRAW (A) 03/07/2020 1123   APPEARANCEUR TURBID (A) 03/07/2020 1123   LABSPEC 1.019 03/07/2020 1123   PHURINE 7.0 03/07/2020 1123   GLUCOSEU NEGATIVE 03/07/2020 1123   GLUCOSEU >=1000 (A) 11/19/2015 1505   HGBUR SMALL (A) 03/07/2020 1123   BILIRUBINUR NEGATIVE 03/07/2020 1123   BILIRUBINUR neg 03/13/2015 1858   KETONESUR NEGATIVE 03/07/2020 1123   PROTEINUR >=300 (A) 03/07/2020 1123   UROBILINOGEN 0.2 11/19/2015 1505   NITRITE NEGATIVE 03/07/2020 1123   LEUKOCYTESUR MODERATE (A) 03/07/2020 1123   Sepsis Labs Recent Labs  Lab 01/29/24 0716 01/30/24 0742 02/02/24 0500 02/03/24 0713  WBC 27.1* 20.2* 18.6* 20.2*   Microbiology Recent Results (from the past 240 hours)  Culture, blood (Routine X 2) w Reflex to ID Panel     Status: None   Collection Time: 01/26/24  6:54 PM   Specimen: BLOOD  Result  Value Ref Range Status   Specimen Description BLOOD RIGHT ANTECUBITAL  Final   Special Requests   Final    BOTTLES DRAWN AEROBIC AND ANAEROBIC Blood Culture results may not be optimal due to an inadequate volume of blood received in culture bottles   Culture   Final    NO GROWTH 5 DAYS Performed at [facility], [address], [place], [place] [address]    Report Status 01/31/2024 FINAL  Final  Culture, blood (Routine X 2) w Reflex to ID Panel     Status: None   Collection Time: 01/26/24  7:02 PM   Specimen: BLOOD  Result Value Ref Range Status   Specimen Description BLOOD SITE NOT SPECIFIED  Final   Special Requests   Final    BOTTLES DRAWN AEROBIC ONLY Blood Culture results may not be optimal due to an inadequate volume of blood received in culture bottles   Culture   Final    NO GROWTH 5 DAYS Performed at [facility], [address] [place], [place] [address]    Report Status 01/31/2024 FINAL  Final  Culture, blood  (Routine X 2) w Reflex to ID Panel     Status: None (Preliminary result)   Collection Time: 01/30/24 10:07 AM   Specimen: BLOOD RIGHT HAND  Result Value Ref Range Status   Specimen Description BLOOD RIGHT HAND  Final   Special Requests   Final    BOTTLES DRAWN AEROBIC ONLY Blood Culture results may not be optimal due to an inadequate volume of blood received in culture bottles   Culture   Final    NO GROWTH 4 DAYS Performed at [facility], [address] [place], [place] [address]    Report Status PENDING  Incomplete  Culture, blood (Routine X 2) w Reflex to ID Panel     Status: None (Preliminary result)   Collection Time: 01/30/24 10:11 AM   Specimen: BLOOD LEFT HAND  Result Value Ref Range Status   Specimen Description BLOOD LEFT HAND  Final   Special Requests   Final    BOTTLES DRAWN AEROBIC ONLY Blood Culture adequate volume   Culture   Final    NO GROWTH 4 DAYS Performed at [facility], [address], [place], [place] [address]    Report Status PENDING  Incomplete     Time coordinating discharge: Over 30 minutes  SIGNED:   Darcel Dawley, MD  [facility] 02/03/2024, 12:58 PM Pager   If 7PM-7AM, please contact night-coverage

## 2024-02-03 NOTE — Progress Notes (Signed)
 Patient refused to do dressing now. Prefer to be done later.

## 2024-02-04 LAB — CULTURE, BLOOD (ROUTINE X 2)
Culture: NO GROWTH
Culture: NO GROWTH
Special Requests: ADEQUATE

## 2024-02-04 LAB — VITAMIN C: Vitamin C: 2.5 mg/dL — ABNORMAL HIGH (ref 0.4–2.0)

## 2024-02-15 ENCOUNTER — Ambulatory Visit (INDEPENDENT_AMBULATORY_CARE_PROVIDER_SITE_OTHER): Admitting: Family

## 2024-02-15 ENCOUNTER — Encounter: Payer: Self-pay | Admitting: Family

## 2024-02-15 DIAGNOSIS — Z89512 Acquired absence of left leg below knee: Secondary | ICD-10-CM

## 2024-02-15 MED ORDER — DOXYCYCLINE HYCLATE 100 MG PO TABS: 100.0000 mg | ORAL_TABLET | Freq: Two times a day (BID) | ORAL | 0 refills | Status: DC

## 2024-02-15 MED ORDER — AMOXICILLIN-POT CLAVULANATE 500-125 MG PO TABS
1.0000 | ORAL_TABLET | Freq: Three times a day (TID) | ORAL | 0 refills | Status: DC
Start: 2024-02-15 — End: 2024-02-19

## 2024-02-15 NOTE — Progress Notes (Signed)
 Office Visit Note   Patient: [name] [name]           Date of Birth: Jan 12, 1966           MRN: [id number] Visit Date: 02/15/2024              Requested by: No referring provider defined for this encounter. 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  She has had dehiscence laterally.  She is currently residing at [facility]  Denies fevers or chills  History of sacral osteomyelitis as well, she is currently completing a course of Augmentin  and doxycycline   Assessment & Plan: Visit Diagnoses: No diagnosis found.  Plan: Plan to proceed with revision below-knee amputation on the left.  Patient is in agreement with the plan.  In the interim she will continue on her oral antibiotics dry dressing changes  Follow-Up Instructions: No follow-ups on file.   Ortho Exam  Patient is alert, oriented, no adenopathy, well-dressed, normal affect, normal respiratory effort. On examination left residual limb laterally there is an area of dehiscence there which measures about 4 cm in diameter there is exposed bone in the wound bed there is no surrounding erythema or warmth no ascending cellulitis    Imaging: No results found. No images are attached to the encounter.  Labs: Lab Results  Component Value Date   HGBA1C 7.1 (H) 11/22/2023   HGBA1C 6.1 (H) 04/11/2021   HGBA1C 6.5 (H) 10/02/2020   ESRSEDRATE 77 (H) 10/23/2009   ESRSEDRATE 80 (H) 10/21/2009   ESRSEDRATE 8 05/30/2007   CRP 19.2 (H) 01/26/2024   REPTSTATUS 02/04/2024 FINAL 01/30/2024   GRAMSTAIN  04/10/2021    ABUNDANT WBC PRESENT,BOTH PMN AND MONONUCLEAR ABUNDANT GRAM POSITIVE COCCI MODERATE GRAM NEGATIVE RODS    CULT  01/30/2024    NO GROWTH 5 DAYS Performed at [facility], [address] [place], [place] [address]    LABORGA PROTEUS  MIRABILIS (A) 05/19/2022   LABORGA KLEBSIELLA PNEUMONIAE (A) 05/19/2022     Lab Results  Component Value Date   ALBUMIN  <1.5 (L) 02/02/2024   ALBUMIN  1.6 (L) 01/31/2024   ALBUMIN  1.6 (L) 01/30/2024    Lab Results  Component Value Date   MG 1.8 02/02/2024   MG 2.0 01/30/2024   MG 2.1 01/29/2024   Lab Results  Component Value Date   VD25OH 25.85 (L) 01/31/2024   VD25OH 23.13 (L) 10/03/2020   VD25OH 16 (L) 05/09/2013    No results found for: PREALBUMIN    Latest Ref Rng & Units 02/03/2024    7:13 AM 02/02/2024    5:00 AM 01/30/2024    7:42 AM  CBC EXTENDED  WBC 4.0 - 10.5 K/uL 20.2  18.6  20.2   RBC 3.87 - 5.11 MIL/uL 4.29  3.76  4.00   Hemoglobin 12.0 - 15.0 g/dL 9.7  8.6  9.1   HCT 63.9 - 46.0 % 31.2  27.6  29.3   Platelets 150 - 400 K/uL 479  527  450   NEUT# 1.7 - 7.7 K/uL   12.3   Lymph# 0.7 - 4.0 K/uL   6.1      There is no height or weight on file to calculate BMI.  Orders:  No orders of the defined types  were placed in this encounter.  No orders of the defined types were placed in this encounter.    Procedures: No procedures performed  Clinical Data: No additional findings.  ROS:  All other systems negative, except as noted in the HPI. Review of Systems  Objective: Vital Signs: There were no vitals taken for this visit.  Specialty Comments:  No specialty comments available.  PMFS History: Patient Active Problem List   Diagnosis Date Noted   Sacral osteomyelitis (HCC) 01/24/2024   Proctocolitis 01/24/2024   Osteomyelitis of left foot (HCC) 11/24/2023   Cellulitis of left lower extremity 11/21/2023   Anemia due to chronic kidney disease 11/21/2023   Hammertoes of both feet 05/21/2022   Hyperpigmentation 05/21/2022   Hypertensive emergency 05/20/2022   Hypertensive urgency, malignant 05/19/2022   Pyocystis 05/19/2022   Hypokalemia 05/19/2022   Cellulitis 04/09/2021   Cellulitis of suprapubic region 04/09/2021   Leukocytosis 04/09/2021    Type 2 diabetes mellitus (HCC) 04/09/2021   Osteoporosis 10/13/2020   Fever    Closed fracture of left proximal tibia 10/10/2020   Fracture of femoral neck, right (HCC) 10/02/2020   Pressure injury of skin 03/12/2020   Sepsis (HCC) 03/07/2020   ESRD (end stage renal disease) (HCC) 03/07/2020   Hyperkalemia 03/07/2020   Acute cystitis 03/07/2020   Acute metabolic encephalopathy 03/07/2020   Impaired vision in both eyes 11/19/2015   Diabetes mellitus with complication (HCC)    Intractable headache    Syncope 07/28/2015   Chronic headaches 07/28/2015   Chest pain 05/22/2015   Urinary frequency 03/13/2015   Easy bruising 10/09/2014   Encounter for preventative adult health care exam with abnormal findings 01/11/2014   Constipation 01/11/2014   GERD (gastroesophageal reflux disease) 01/11/2014   Helicobacter positive gastritis 12/06/2013   Dizziness and giddiness 10/12/2013   Essential hypertension 09/19/2013   Back pain 06/11/2013   Lumbosacral spondylosis without myelopathy 02/20/2013   Diabetic neuropathy (HCC) 01/09/2013   DM (diabetes mellitus), type 2, uncontrolled 10/02/2012   MITRAL REGURGITATION, mild 10/31/2009   Pleural effusion 10/20/2009   Past Medical History:  Diagnosis Date   Colitis    Diabetes mellitus    ESRD (end stage renal disease) (HCC)    on HD (M,W,F)   Family history of adverse reaction to anesthesia     my uncle's heart stoped    Fracture of femoral neck, right (HCC) 10/02/2020   GERD (gastroesophageal reflux disease)    Heart murmur    Hypertension    Impaired vision in both eyes 11/19/2015   Mitral regurgitation    Neuropathy in diabetes (HCC)    Syncope and collapse 07/28/2015    Family History  Problem Relation Age of Onset   Diabetes Father    Heart attack Father    Aneurysm Mother     Past Surgical History:  Procedure Laterality Date   ABDOMINAL HYSTERECTOMY  1999   for fibroids. she thinks one ovary was left   AMPUTATION  Left 11/25/2023   Procedure: AMPUTATION BELOW KNEE LEFT;  Surgeon: Harden Jerona GAILS, MD;  Location: [facility] OR;  Service: Orthopedics;  Laterality: Left;   CATARACT EXTRACTION     CESAREAN SECTION     CHOLECYSTECTOMY     EYE SURGERY Left retinal   GANGLION CYST EXCISION     HIP PINNING,CANNULATED Right 10/02/2020   Procedure: CANNULATED HIP PINNING;  Surgeon: Celena Sharper, MD;  Location: [facility] OR;  Service: Orthopedics;  Laterality: Right;   INCISION AND DRAINAGE ABSCESS N/A 04/10/2021  Procedure: INCISION AND DRAINAGE ABSCESS LABIA;  Surgeon: Curvin Deward MOULD, MD;  Location: [facility] OR;  Service: General;  Laterality: N/A;   IR FLUORO GUIDE CV LINE LEFT  10/13/2020   IR PTA VENOUS EXCEPT DIALYSIS CIRCUIT  10/13/2020   ORIF TIBIA PLATEAU Left 10/09/2020   Procedure: OPEN REDUCTION INTERNAL FIXATION (ORIF) TIBIAL PLATEAU;  Surgeon: Celena Sharper, MD;  Location: [facility] OR;  Service: Orthopedics;  Laterality: Left;   TONSILLECTOMY     TUBAL LIGATION     Social History   Occupational History   Occupation: Disabled  Tobacco Use   Smoking status: Never   Smokeless tobacco: Never  Substance and Sexual Activity   Alcohol use: No    Alcohol/week: 0.0 standard drinks of alcohol   Drug use: No   Sexual activity: Yes

## 2024-02-16 ENCOUNTER — Other Ambulatory Visit: Payer: Self-pay

## 2024-02-16 ENCOUNTER — Inpatient Hospital Stay (HOSPITAL_COMMUNITY)

## 2024-02-16 ENCOUNTER — Inpatient Hospital Stay (HOSPITAL_COMMUNITY)
Admission: EM | Admit: 2024-02-16 | Discharge: 2024-03-05 | DRG: 871 | Disposition: E | Source: Ambulatory Visit | Attending: Pulmonary Disease | Admitting: Pulmonary Disease

## 2024-02-16 ENCOUNTER — Emergency Department (HOSPITAL_COMMUNITY)

## 2024-02-16 ENCOUNTER — Encounter (HOSPITAL_COMMUNITY): Payer: Self-pay

## 2024-02-16 DIAGNOSIS — Z86718 Personal history of other venous thrombosis and embolism: Secondary | ICD-10-CM

## 2024-02-16 DIAGNOSIS — Z886 Allergy status to analgesic agent status: Secondary | ICD-10-CM

## 2024-02-16 DIAGNOSIS — E1169 Type 2 diabetes mellitus with other specified complication: Secondary | ICD-10-CM | POA: Diagnosis present

## 2024-02-16 DIAGNOSIS — G9340 Encephalopathy, unspecified: Secondary | ICD-10-CM | POA: Diagnosis present

## 2024-02-16 DIAGNOSIS — Z992 Dependence on renal dialysis: Secondary | ICD-10-CM

## 2024-02-16 DIAGNOSIS — L89154 Pressure ulcer of sacral region, stage 4: Secondary | ICD-10-CM | POA: Diagnosis present

## 2024-02-16 DIAGNOSIS — I10 Essential (primary) hypertension: Secondary | ICD-10-CM

## 2024-02-16 DIAGNOSIS — Z8673 Personal history of transient ischemic attack (TIA), and cerebral infarction without residual deficits: Secondary | ICD-10-CM

## 2024-02-16 DIAGNOSIS — A419 Sepsis, unspecified organism: Secondary | ICD-10-CM | POA: Diagnosis present

## 2024-02-16 DIAGNOSIS — Z993 Dependence on wheelchair: Secondary | ICD-10-CM

## 2024-02-16 DIAGNOSIS — I120 Hypertensive chronic kidney disease with stage 5 chronic kidney disease or end stage renal disease: Secondary | ICD-10-CM | POA: Diagnosis present

## 2024-02-16 DIAGNOSIS — R6521 Severe sepsis with septic shock: Secondary | ICD-10-CM | POA: Diagnosis present

## 2024-02-16 DIAGNOSIS — Z515 Encounter for palliative care: Secondary | ICD-10-CM | POA: Diagnosis not present

## 2024-02-16 DIAGNOSIS — T8781 Dehiscence of amputation stump: Secondary | ICD-10-CM | POA: Diagnosis present

## 2024-02-16 DIAGNOSIS — J9601 Acute respiratory failure with hypoxia: Secondary | ICD-10-CM | POA: Diagnosis present

## 2024-02-16 DIAGNOSIS — N186 End stage renal disease: Secondary | ICD-10-CM | POA: Diagnosis present

## 2024-02-16 DIAGNOSIS — R54 Age-related physical debility: Secondary | ICD-10-CM | POA: Diagnosis present

## 2024-02-16 DIAGNOSIS — N898 Other specified noninflammatory disorders of vagina: Secondary | ICD-10-CM | POA: Diagnosis present

## 2024-02-16 DIAGNOSIS — Z90710 Acquired absence of both cervix and uterus: Secondary | ICD-10-CM

## 2024-02-16 DIAGNOSIS — D631 Anemia in chronic kidney disease: Secondary | ICD-10-CM | POA: Diagnosis present

## 2024-02-16 DIAGNOSIS — Z66 Do not resuscitate: Secondary | ICD-10-CM | POA: Diagnosis present

## 2024-02-16 DIAGNOSIS — R627 Adult failure to thrive: Secondary | ICD-10-CM | POA: Diagnosis present

## 2024-02-16 DIAGNOSIS — Z8249 Family history of ischemic heart disease and other diseases of the circulatory system: Secondary | ICD-10-CM

## 2024-02-16 DIAGNOSIS — H543 Unqualified visual loss, both eyes: Secondary | ICD-10-CM | POA: Diagnosis present

## 2024-02-16 DIAGNOSIS — E1140 Type 2 diabetes mellitus with diabetic neuropathy, unspecified: Secondary | ICD-10-CM | POA: Diagnosis present

## 2024-02-16 DIAGNOSIS — Z821 Family history of blindness and visual loss: Secondary | ICD-10-CM

## 2024-02-16 DIAGNOSIS — E875 Hyperkalemia: Principal | ICD-10-CM | POA: Diagnosis present

## 2024-02-16 DIAGNOSIS — E1122 Type 2 diabetes mellitus with diabetic chronic kidney disease: Secondary | ICD-10-CM | POA: Diagnosis present

## 2024-02-16 DIAGNOSIS — E1165 Type 2 diabetes mellitus with hyperglycemia: Secondary | ICD-10-CM | POA: Diagnosis present

## 2024-02-16 DIAGNOSIS — I3139 Other pericardial effusion (noninflammatory): Secondary | ICD-10-CM | POA: Diagnosis present

## 2024-02-16 DIAGNOSIS — I959 Hypotension, unspecified: Secondary | ICD-10-CM

## 2024-02-16 DIAGNOSIS — Z885 Allergy status to narcotic agent status: Secondary | ICD-10-CM

## 2024-02-16 DIAGNOSIS — K219 Gastro-esophageal reflux disease without esophagitis: Secondary | ICD-10-CM | POA: Diagnosis present

## 2024-02-16 DIAGNOSIS — Z7901 Long term (current) use of anticoagulants: Secondary | ICD-10-CM

## 2024-02-16 DIAGNOSIS — Y835 Amputation of limb(s) as the cause of abnormal reaction of the patient, or of later complication, without mention of misadventure at the time of the procedure: Secondary | ICD-10-CM | POA: Diagnosis present

## 2024-02-16 DIAGNOSIS — I480 Paroxysmal atrial fibrillation: Secondary | ICD-10-CM | POA: Diagnosis present

## 2024-02-16 DIAGNOSIS — M4628 Osteomyelitis of vertebra, sacral and sacrococcygeal region: Secondary | ICD-10-CM | POA: Diagnosis present

## 2024-02-16 DIAGNOSIS — E119 Type 2 diabetes mellitus without complications: Secondary | ICD-10-CM | POA: Diagnosis not present

## 2024-02-16 DIAGNOSIS — Z79899 Other long term (current) drug therapy: Secondary | ICD-10-CM

## 2024-02-16 DIAGNOSIS — I32 Pericarditis in diseases classified elsewhere: Secondary | ICD-10-CM | POA: Diagnosis present

## 2024-02-16 DIAGNOSIS — L98429 Non-pressure chronic ulcer of back with unspecified severity: Secondary | ICD-10-CM

## 2024-02-16 DIAGNOSIS — Z888 Allergy status to other drugs, medicaments and biological substances status: Secondary | ICD-10-CM

## 2024-02-16 DIAGNOSIS — Z881 Allergy status to other antibiotic agents status: Secondary | ICD-10-CM

## 2024-02-16 DIAGNOSIS — E872 Acidosis, unspecified: Secondary | ICD-10-CM | POA: Diagnosis present

## 2024-02-16 DIAGNOSIS — N2581 Secondary hyperparathyroidism of renal origin: Secondary | ICD-10-CM | POA: Diagnosis present

## 2024-02-16 DIAGNOSIS — K6289 Other specified diseases of anus and rectum: Secondary | ICD-10-CM | POA: Diagnosis present

## 2024-02-16 DIAGNOSIS — Z833 Family history of diabetes mellitus: Secondary | ICD-10-CM

## 2024-02-16 DIAGNOSIS — I4891 Unspecified atrial fibrillation: Secondary | ICD-10-CM

## 2024-02-16 DIAGNOSIS — I9589 Other hypotension: Secondary | ICD-10-CM | POA: Diagnosis not present

## 2024-02-16 DIAGNOSIS — Z7401 Bed confinement status: Secondary | ICD-10-CM

## 2024-02-16 DIAGNOSIS — Z635 Disruption of family by separation and divorce: Secondary | ICD-10-CM

## 2024-02-16 DIAGNOSIS — R579 Shock, unspecified: Secondary | ICD-10-CM | POA: Diagnosis present

## 2024-02-16 LAB — I-STAT VENOUS BLOOD GAS, ED
Acid-base deficit: 2 mmol/L (ref 0.0–2.0)
Bicarbonate: 24 mmol/L (ref 20.0–28.0)
Calcium, Ion: 1.04 mmol/L — ABNORMAL LOW (ref 1.15–1.40)
HCT: 30 % — ABNORMAL LOW (ref 36.0–46.0)
Hemoglobin: 10.2 g/dL — ABNORMAL LOW (ref 12.0–15.0)
O2 Saturation: 43 %
Potassium: 7 mmol/L (ref 3.5–5.1)
Sodium: 133 mmol/L — ABNORMAL LOW (ref 135–145)
TCO2: 25 mmol/L (ref 22–32)
pCO2, Ven: 47.9 mmHg (ref 44–60)
pH, Ven: 7.309 (ref 7.25–7.43)
pO2, Ven: 26 mmHg — CL (ref 32–45)

## 2024-02-16 LAB — MRSA NEXT GEN BY PCR, NASAL: MRSA by PCR Next Gen: DETECTED — AB

## 2024-02-16 LAB — RENAL FUNCTION PANEL
Albumin: 2 g/dL — ABNORMAL LOW (ref 3.5–5.0)
Anion gap: 18 — ABNORMAL HIGH (ref 5–15)
BUN: 71 mg/dL — ABNORMAL HIGH (ref 6–20)
CO2: 15 mmol/L — ABNORMAL LOW (ref 22–32)
Calcium: 8.1 mg/dL — ABNORMAL LOW (ref 8.9–10.3)
Chloride: 100 mmol/L (ref 98–111)
Creatinine, Ser: 5.17 mg/dL — ABNORMAL HIGH (ref 0.44–1.00)
GFR, Estimated: 9 mL/min — ABNORMAL LOW (ref >60)
Glucose, Bld: 316 mg/dL — ABNORMAL HIGH (ref 70–99)
Phosphorus: 5.1 mg/dL — ABNORMAL HIGH (ref 2.5–4.6)
Potassium: 6 mmol/L — ABNORMAL HIGH (ref 3.5–5.1)
Sodium: 133 mmol/L — ABNORMAL LOW (ref 135–145)

## 2024-02-16 LAB — CBC WITH DIFFERENTIAL/PLATELET
Abs Immature Granulocytes: 0.05 K/uL (ref 0.00–0.07)
Basophils Absolute: 0.1 K/uL (ref 0.0–0.1)
Basophils Relative: 1 %
Eosinophils Absolute: 0.1 K/uL (ref 0.0–0.5)
Eosinophils Relative: 1 %
HCT: 26.7 % — ABNORMAL LOW (ref 36.0–46.0)
Hemoglobin: 8.1 g/dL — ABNORMAL LOW (ref 12.0–15.0)
Immature Granulocytes: 0 %
Lymphocytes Relative: 27 %
Lymphs Abs: 3.3 K/uL (ref 0.7–4.0)
MCH: 22.1 pg — ABNORMAL LOW (ref 26.0–34.0)
MCHC: 30.3 g/dL (ref 30.0–36.0)
MCV: 72.8 fL — ABNORMAL LOW (ref 80.0–100.0)
Monocytes Absolute: 1.2 K/uL — ABNORMAL HIGH (ref 0.1–1.0)
Monocytes Relative: 10 %
Neutro Abs: 7.2 K/uL (ref 1.7–7.7)
Neutrophils Relative %: 61 %
Platelets: 374 K/uL (ref 150–400)
RBC: 3.67 MIL/uL — ABNORMAL LOW (ref 3.87–5.11)
RDW: 21.9 % — ABNORMAL HIGH (ref 11.5–15.5)
WBC: 11.9 K/uL — ABNORMAL HIGH (ref 4.0–10.5)
nRBC: 0 % (ref 0.0–0.2)

## 2024-02-16 LAB — COMPREHENSIVE METABOLIC PANEL WITH GFR
ALT: 19 U/L (ref 0–44)
AST: 33 U/L (ref 15–41)
Albumin: 2 g/dL — ABNORMAL LOW (ref 3.5–5.0)
Alkaline Phosphatase: 155 U/L — ABNORMAL HIGH (ref 38–126)
Anion gap: 13 (ref 5–15)
BUN: 73 mg/dL — ABNORMAL HIGH (ref 6–20)
CO2: 20 mmol/L — ABNORMAL LOW (ref 22–32)
Calcium: 7.9 mg/dL — ABNORMAL LOW (ref 8.9–10.3)
Chloride: 99 mmol/L (ref 98–111)
Creatinine, Ser: 5.45 mg/dL — ABNORMAL HIGH (ref 0.44–1.00)
GFR, Estimated: 9 mL/min — ABNORMAL LOW (ref >60)
Glucose, Bld: 354 mg/dL — ABNORMAL HIGH (ref 70–99)
Potassium: 6.1 mmol/L — ABNORMAL HIGH (ref 3.5–5.1)
Sodium: 132 mmol/L — ABNORMAL LOW (ref 135–145)
Total Bilirubin: 0.6 mg/dL (ref 0.0–1.2)
Total Protein: 7.2 g/dL (ref 6.5–8.1)

## 2024-02-16 LAB — GLUCOSE, CAPILLARY
Glucose-Capillary: 298 mg/dL — ABNORMAL HIGH (ref 70–99)
Glucose-Capillary: 308 mg/dL — ABNORMAL HIGH (ref 70–99)
Glucose-Capillary: 308 mg/dL — ABNORMAL HIGH (ref 70–99)

## 2024-02-16 LAB — I-STAT CG4 LACTIC ACID, ED
Lactic Acid, Venous: 2.2 mmol/L (ref 0.5–1.9)
Lactic Acid, Venous: 1.9 mmol/L (ref 0.5–1.9)

## 2024-02-16 LAB — RESP PANEL BY RT-PCR (RSV, FLU A&B, COVID)  RVPGX2
Influenza A by PCR: NEGATIVE
Influenza B by PCR: NEGATIVE
Resp Syncytial Virus by PCR: NEGATIVE
SARS Coronavirus 2 by RT PCR: NEGATIVE

## 2024-02-16 LAB — PROTIME-INR
INR: 2.1 — ABNORMAL HIGH (ref 0.8–1.2)
Prothrombin Time: 24.7 s — ABNORMAL HIGH (ref 11.4–15.2)

## 2024-02-16 LAB — TROPONIN I (HIGH SENSITIVITY)
Troponin I (High Sensitivity): 36 ng/L — ABNORMAL HIGH (ref <18)
Troponin I (High Sensitivity): 38 ng/L — ABNORMAL HIGH (ref <18)

## 2024-02-16 LAB — CBG MONITORING, ED: Glucose-Capillary: 310 mg/dL — ABNORMAL HIGH (ref 70–99)

## 2024-02-16 LAB — BETA-HYDROXYBUTYRIC ACID: Beta-Hydroxybutyric Acid: 0.46 mmol/L — ABNORMAL HIGH (ref 0.05–0.27)

## 2024-02-16 MED ORDER — ALBUMIN HUMAN 25 % IV SOLN
25.0000 g | Freq: Four times a day (QID) | INTRAVENOUS | Status: AC
  Administered 2024-02-16 – 2024-02-17 (×3): 25 g via INTRAVENOUS
  Filled 2024-02-16 (×3): qty 100

## 2024-02-16 MED ORDER — PENTAFLUOROPROP-TETRAFLUOROETH EX AERO: 1.0000 | INHALATION_SPRAY | CUTANEOUS | Status: DC | PRN

## 2024-02-16 MED ORDER — CALCIUM GLUCONATE-NACL 1-0.675 GM/50ML-% IV SOLN
1.0000 g | Freq: Once | INTRAVENOUS | Status: AC
  Administered 2024-02-16: 1000 mg via INTRAVENOUS
  Filled 2024-02-16: qty 50

## 2024-02-16 MED ORDER — SODIUM CHLORIDE 0.9 % IV SOLN
2.0000 g | Freq: Once | INTRAVENOUS | Status: AC
  Administered 2024-02-16: 2 g via INTRAVENOUS
  Filled 2024-02-16: qty 12.5

## 2024-02-16 MED ORDER — ALTEPLASE 2 MG IJ SOLR: 2.0000 mg | Freq: Once | INTRAMUSCULAR | Status: DC | PRN

## 2024-02-16 MED ORDER — DOCUSATE SODIUM 100 MG PO CAPS
100.0000 mg | ORAL_CAPSULE | Freq: Two times a day (BID) | ORAL | Status: DC | PRN
Start: 2024-02-16 — End: 2024-02-18

## 2024-02-16 MED ORDER — NOREPINEPHRINE 4 MG/250ML-% IV SOLN
0.0000 ug/min | INTRAVENOUS | Status: DC
  Administered 2024-02-16: 2 ug/min via INTRAVENOUS
  Administered 2024-02-17: 7 ug/min via INTRAVENOUS
  Administered 2024-02-17: 3 ug/min via INTRAVENOUS
  Filled 2024-02-16 (×3): qty 250

## 2024-02-16 MED ORDER — POLYETHYLENE GLYCOL 3350 17 G PO PACK: 17.0000 g | PACK | Freq: Every day | ORAL | Status: DC | PRN

## 2024-02-16 MED ORDER — SODIUM CHLORIDE 0.9 % IV SOLN
2.0000 g | Freq: Two times a day (BID) | INTRAVENOUS | Status: DC
  Administered 2024-02-17: 2 g via INTRAVENOUS
  Filled 2024-02-16: qty 12.5

## 2024-02-16 MED ORDER — SODIUM CHLORIDE 0.9 % IV BOLUS: 500.0000 mL | Freq: Once | INTRAVENOUS | Status: DC

## 2024-02-16 MED ORDER — CHLORHEXIDINE GLUCONATE CLOTH 2 % EX PADS
6.0000 | MEDICATED_PAD | Freq: Every day | CUTANEOUS | Status: DC
  Administered 2024-02-17: 6 via TOPICAL

## 2024-02-16 MED ORDER — FAMOTIDINE 20 MG PO TABS: 20.0000 mg | ORAL_TABLET | Freq: Two times a day (BID) | ORAL | Status: DC

## 2024-02-16 MED ORDER — HEPARIN SODIUM (PORCINE) 1000 UNIT/ML DIALYSIS: 1000.0000 [IU] | INTRAMUSCULAR | Status: DC | PRN

## 2024-02-16 MED ORDER — AMIODARONE LOAD VIA INFUSION
150.0000 mg | Freq: Once | INTRAVENOUS | Status: AC
  Administered 2024-02-16: 150 mg via INTRAVENOUS
  Filled 2024-02-16: qty 83.34

## 2024-02-16 MED ORDER — INSULIN ASPART 100 UNIT/ML IJ SOLN
0.0000 [IU] | Freq: Three times a day (TID) | INTRAMUSCULAR | Status: DC
  Administered 2024-02-16: 7 [IU] via SUBCUTANEOUS
  Administered 2024-02-17 (×3): 1 [IU] via SUBCUTANEOUS
  Administered 2024-02-17: 2 [IU] via SUBCUTANEOUS
  Administered 2024-02-18: 1 [IU] via SUBCUTANEOUS

## 2024-02-16 MED ORDER — METRONIDAZOLE 500 MG/100ML IV SOLN
500.0000 mg | Freq: Once | INTRAVENOUS | Status: AC
  Administered 2024-02-16: 500 mg via INTRAVENOUS
  Filled 2024-02-16: qty 100

## 2024-02-16 MED ORDER — MIDODRINE HCL 5 MG PO TABS
5.0000 mg | ORAL_TABLET | Freq: Three times a day (TID) | ORAL | Status: DC
  Administered 2024-02-17: 5 mg via ORAL
  Filled 2024-02-16: qty 1

## 2024-02-16 MED ORDER — ANTICOAGULANT SODIUM CITRATE 4% (200MG/5ML) IV SOLN: 5.0000 mL | Status: DC | PRN

## 2024-02-16 MED ORDER — SODIUM ZIRCONIUM CYCLOSILICATE 10 G PO PACK
10.0000 g | PACK | Freq: Once | ORAL | Status: AC
  Administered 2024-02-16: 10 g via ORAL
  Filled 2024-02-16: qty 1

## 2024-02-16 MED ORDER — PRISMASOL BGK 2/3.5 32-2-3.5 MEQ/L EC SOLN: Status: DC

## 2024-02-16 MED ORDER — PRISMASOL BGK 4/2.5 32-4-2.5 MEQ/L EC SOLN: Status: DC

## 2024-02-16 MED ORDER — LIDOCAINE-PRILOCAINE 2.5-2.5 % EX CREA: 1.0000 | TOPICAL_CREAM | CUTANEOUS | Status: DC | PRN

## 2024-02-16 MED ORDER — SODIUM CHLORIDE 0.9 % IV BOLUS (SEPSIS)
500.0000 mL | Freq: Once | INTRAVENOUS | Status: AC
  Administered 2024-02-16: 500 mL via INTRAVENOUS

## 2024-02-16 MED ORDER — VANCOMYCIN HCL 750 MG/150ML IV SOLN
750.0000 mg | INTRAVENOUS | Status: DC
  Administered 2024-02-17 – 2024-02-18 (×2): 750 mg via INTRAVENOUS
  Filled 2024-02-16 (×2): qty 150

## 2024-02-16 MED ORDER — OXYCODONE HCL 5 MG PO TABS
5.0000 mg | ORAL_TABLET | ORAL | Status: DC | PRN
  Administered 2024-02-16: 5 mg via ORAL
  Filled 2024-02-16: qty 1

## 2024-02-16 MED ORDER — VANCOMYCIN HCL IN DEXTROSE 1-5 GM/200ML-% IV SOLN
1000.0000 mg | Freq: Once | INTRAVENOUS | Status: AC
  Administered 2024-02-16: 1000 mg via INTRAVENOUS
  Filled 2024-02-16: qty 200

## 2024-02-16 MED ORDER — CALCITRIOL 0.5 MCG PO CAPS
0.5000 ug | ORAL_CAPSULE | ORAL | Status: DC
  Administered 2024-02-18: 0.5 ug via ORAL
  Filled 2024-02-16: qty 1

## 2024-02-16 MED ORDER — IOHEXOL 350 MG/ML SOLN
75.0000 mL | Freq: Once | INTRAVENOUS | Status: AC | PRN
  Administered 2024-02-16: 75 mL via INTRAVENOUS

## 2024-02-16 MED ORDER — HEPARIN SODIUM (PORCINE) 1000 UNIT/ML DIALYSIS
1000.0000 [IU] | INTRAMUSCULAR | Status: DC | PRN
  Administered 2024-02-18: 1000 [IU] via INTRAVENOUS_CENTRAL
  Filled 2024-02-16 (×3): qty 6

## 2024-02-16 MED ORDER — SODIUM CHLORIDE 0.9 % IV SOLN
250.0000 mL | INTRAVENOUS | Status: AC
  Administered 2024-02-16: 250 mL via INTRAVENOUS

## 2024-02-16 MED ORDER — AMIODARONE HCL IN DEXTROSE 360-4.14 MG/200ML-% IV SOLN
60.0000 mg/h | INTRAVENOUS | Status: AC
  Administered 2024-02-16 (×2): 60 mg/h via INTRAVENOUS
  Filled 2024-02-16 (×2): qty 200

## 2024-02-16 MED ORDER — LIDOCAINE HCL (PF) 1 % IJ SOLN: 5.0000 mL | INTRAMUSCULAR | Status: DC | PRN

## 2024-02-16 MED ORDER — APIXABAN 2.5 MG PO TABS: 2.5000 mg | ORAL_TABLET | Freq: Two times a day (BID) | ORAL | Status: DC

## 2024-02-16 MED ORDER — ALBUTEROL SULFATE (2.5 MG/3ML) 0.083% IN NEBU
15.0000 mg | INHALATION_SOLUTION | Freq: Once | RESPIRATORY_TRACT | Status: AC
  Administered 2024-02-16: 15 mg via RESPIRATORY_TRACT
  Filled 2024-02-16: qty 18

## 2024-02-16 MED ORDER — SODIUM CHLORIDE 0.9 % FOR CRRT: INTRAVENOUS_CENTRAL | Status: DC | PRN

## 2024-02-16 MED ORDER — SODIUM CHLORIDE 0.9 % IV BOLUS (SEPSIS)
1000.0000 mL | Freq: Once | INTRAVENOUS | Status: AC
  Administered 2024-02-16: 1000 mL via INTRAVENOUS

## 2024-02-16 MED ORDER — LACTATED RINGERS IV BOLUS: 500.0000 mL | Freq: Once | INTRAVENOUS | Status: DC

## 2024-02-16 MED ORDER — SODIUM CHLORIDE 0.9 % IV BOLUS (SEPSIS): 1000.0000 mL | Freq: Once | INTRAVENOUS | Status: DC

## 2024-02-16 MED ORDER — AMIODARONE HCL IN DEXTROSE 360-4.14 MG/200ML-% IV SOLN
15.0000 mg/h | INTRAVENOUS | Status: DC
  Administered 2024-02-17 (×3): 30 mg/h via INTRAVENOUS
  Administered 2024-02-18: 15 mg/h via INTRAVENOUS
  Administered 2024-02-18: 30 mg/h via INTRAVENOUS
  Filled 2024-02-16 (×4): qty 200

## 2024-02-16 MED ORDER — INSULIN ASPART 100 UNIT/ML IJ SOLN
4.0000 [IU] | Freq: Once | INTRAMUSCULAR | Status: AC
  Administered 2024-02-16: 4 [IU] via INTRAVENOUS

## 2024-02-16 MED ORDER — INSULIN ASPART 100 UNIT/ML IJ SOLN: 0.0000 [IU] | Freq: Three times a day (TID) | INTRAMUSCULAR | Status: DC

## 2024-02-16 MED ORDER — SODIUM ZIRCONIUM CYCLOSILICATE 10 G PO PACK: 10.0000 g | PACK | Freq: Once | ORAL | Status: DC

## 2024-02-16 NOTE — Sepsis Progress Note (Signed)
 eLink is following this Code Sepsis.

## 2024-02-16 NOTE — ED Provider Notes (Signed)
 Patient signed out to me by previous provider. Please refer to their note for full HPI.  Briefly this is a 58 year old female who presented to the emergency department with multiple complaints including hypotension, hyperkalemia, vision loss that started yesterday, weakness.  Workup thus far is concerning for possible sepsis secondary to sacral wound, hyperkalemia requiring dialysis in the setting of hypotension that was not fluid responsive.  Currently on low-dose pressors.  Has been covered broadly for sepsis.  She is out of the window for TNK in regards to the vision loss and not a thrombectomy candidate.   Patient signed out pending CT scans and admission to ICU.   ED Course: Patient continues to have right eye vision loss and some left-sided extremity weakness, pending CTA of the head and neck for further evaluation from neurology standpoint.  The symptoms started yesterday, she is out of the window for acute treatment.  Of note the patient is now going in and out of atrial fibrillation with a stable blood pressure.  New EKG correlates with this as does telemetry review.  Chart review does not show any findings of A-fib.  I consulted with critical care who has accepted the patient, she is pending CT imaging.  Nephrology is on board for dialysis in regards to the hyperkalemia and will coordinate with ICU.  Patients evaluation and results requires admission for further treatment and care.  Spoke with hospitalist, reviewed patient's ED course and they accept admission.  Patient agrees with admission plan, offers no new complaints and is stable/unchanged at time of admit.  CRITICAL CARE Performed by: Roxie HERO Aundrea Higginbotham   Total critical care time: 30 minutes  Critical care time was exclusive of separately billable procedures and treating other patients.  Critical care was necessary to treat or prevent imminent or life-threatening deterioration.  Critical care was time spent personally by me on  the following activities: development of treatment plan with patient and/or surrogate as well as nursing, discussions with consultants, evaluation of patient's response to treatment, examination of patient, obtaining history from patient or surrogate, ordering and performing treatments and interventions, ordering and review of laboratory studies, ordering and review of radiographic studies, pulse oximetry and re-evaluation of patient's condition.    Bari Roxie HERO, DO 02/16/24 [id number]

## 2024-02-16 NOTE — ED Notes (Signed)
 MD made aware of bradycardia and hypotension.

## 2024-02-16 NOTE — ED Triage Notes (Signed)
 Patient BIB [facility] from dialysis today for weakness, sudden vision loss yesterday, hypotension, and hyperglycemia. Patient suddenly lost vision yesterday during ride to [facility] from dr's appt. Patient noted to have right drift, leaning to the right which she reported starting yesterday. CBG 472 with EMS. BP 80/50

## 2024-02-16 NOTE — ED Notes (Signed)
 CCMD called, pt on monitor

## 2024-02-16 NOTE — Progress Notes (Signed)
 eLink Physician-Brief Progress Note Patient Name: [name] [name] DOB: Jun 21, 1966 MRN: [id number]   Date of Service  02/16/2024  HPI/Events of Note  Admitted for shock presumed secondary to sacral wound infection. Known stage IV sacral ulcer POA.  Having pain 9/10. Able to tolerate PO  eICU Interventions  Restarted home oxycodone  5 mg q4h PRN      Intervention Category Minor Interventions: Agitation / anxiety - evaluation and management  Maxten Shuler Slater Staff 02/16/2024, 9:08 PM

## 2024-02-16 NOTE — Sepsis Progress Note (Addendum)
 Secure chat with Caitlyn Bostian, RN at bedside with patient. She confirmed that an attempt was made to get the blood cultures, but was ultimately unsuccessful. Antibiotic given to prevent delay of care. MD notified.

## 2024-02-16 NOTE — ED Provider Notes (Signed)
 [place] EMERGENCY DEPARTMENT AT [facility] Provider Note   CSN: [id number] Arrival date & time: 02/16/24  1210     Patient presents with: Loss of Vision, Hypotension, Hyperglycemia, and Weakness   [name] [name] is a 58 y.o. female.   58 year old female with history of ESRD on TTS HD, anemia of CKD, diabetes, left eye blindness, hypertension, lumbar radiculopathy, left BKA, and CVA without residual deficits who presents to the emergency department with generalized weakness and vision loss.  Patient reports that yesterday she lost vision out of both eyes.  Thinks that this happened in the afternoon but unsure what time.  Says that she also has been feeling weaker than usual.  Went to dialysis and was hypotensive so they brought her to the emergency department.  CBG was also found to be elevated.  Per chart review it appears that she does have a history of impaired vision in both eyes but she tells me she can normally see up close with her right eye which she no longer can and can only see light. Says it was painless aside from some neck pain.  Is wheelchair-bound at baseline.  Lives at facility currently.  Recently admitted for sacral osteomyelitis.        Prior to Admission medications   Medication Sig Start Date End Date Taking? Authorizing Provider  amLODipine  (NORVASC ) 5 MG tablet Take 1 tablet (5 mg total) by mouth every evening. 12/06/23   Regalado, Belkys A, MD  amoxicillin -clavulanate (AUGMENTIN ) 500-125 MG tablet Take 1 tablet by mouth 3 (three) times daily. 02/15/24   Valdemar Rocky JONELLE, NP  ascorbic acid  (VITAMIN C ) 1000 MG tablet Take 1 tablet (1,000 mg total) by mouth daily. 12/06/23   Regalado, Belkys A, MD  atorvastatin  (LIPITOR) 20 MG tablet Take 1 tablet (20 mg total) by mouth daily. 12/06/23   Regalado, Owen A, MD  calcitRIOL  (ROCALTROL ) 0.25 MCG capsule Take 1 capsule (0.25 mcg total) by mouth Every Tuesday,Thursday,and Saturday with dialysis. 12/06/23   Regalado,  Owen A, MD  calcium  carbonate (TUMS) 500 MG chewable tablet Chew 1 tablet (200 mg of elemental calcium  total) by mouth 3 (three) times daily with meals. 12/06/23   Regalado, Belkys A, MD  cloNIDine  (CATAPRES  - DOSED IN MG/24 HR) 0.3 mg/24hr patch Place 0.3 mg onto the skin every Wednesday.    [provider]  doxycycline  (VIBRA -TABS) 100 MG tablet Take 1 tablet (100 mg total) by mouth 2 (two) times daily. 02/15/24   Valdemar Rocky JONELLE, NP  ELIQUIS  5 MG TABS tablet Take 5 mg by mouth 2 (two) times daily. 11/21/23   [provider]  ferrous sulfate 325 (65 FE) MG EC tablet Take 325 mg by mouth in the morning and at bedtime.    [provider]  fluticasone (FLONASE) 50 MCG/ACT nasal spray Place 2 sprays into both nostrils in the morning and at bedtime.    [provider]  gabapentin  (NEURONTIN ) 100 MG capsule Take 1 capsule (100 mg total) by mouth 3 (three) times daily. For 1 week take 1 capsule by mouth at bedtime, following week twice daily, following week 3 times daily 01/04/24   Zamora, Erin R, NP  hydrOXYzine  (ATARAX ) 10 MG tablet Take 10 mg by mouth 3 (three) times daily. 01/13/24   [provider]  isosorbide  mononitrate (IMDUR ) 30 MG 24 hr tablet Take 30 mg by mouth See admin instructions. Take 30 mg by mouth at 8 AM on Sun/Mon/Wed/Fri 11/16/23   [provider]  lidocaine  (LIDODERM ) 5 % Place 1 patch onto the skin See admin instructions. Apply 1 new patch to bilateral feet at 8 AM daily- Remove & Discard patch within 12 hours or as directed by MD    [provider]  losartan  (COZAAR ) 50 MG tablet Take 1 tablet (50 mg total) by mouth daily. 02/04/24 03/05/24  Leotis Bogus, MD  metoprolol  tartrate (LOPRESSOR ) 50 MG tablet Take 1 tablet (50 mg total) by mouth 2 (two) times daily. 12/06/23   Regalado, Belkys A, MD  multivitamin (RENA-VIT) TABS tablet Take 1 tablet by mouth daily at 8 pm.    [provider]  nystatin cream (MYCOSTATIN) Apply 1  Application topically See admin instructions. Apply to sacrum 2 times a day after cleansing ab=nd drying. Apply collagen particles after nystatin cream. 11/16/23   [provider]  omeprazole  (PRILOSEC) 40 MG capsule Take 40 mg by mouth in the morning and at bedtime.    [provider]  ondansetron  (ZOFRAN ) 4 MG tablet Take 1 tablet (4 mg total) by mouth every 8 (eight) hours as needed for nausea or vomiting. 05/22/22   Rai, Ripudeep K, MD  PROTEIN PO Take 30 mLs by mouth 2 (two) times daily.    [provider]  saccharomyces boulardii (FLORASTOR) 250 MG capsule Take 1 capsule (250 mg total) by mouth 2 (two) times daily. 02/03/24 03/04/24  Leotis Bogus, MD  thiamine  (VITAMIN B-1) 100 MG tablet Take 1 tablet (100 mg total) by mouth daily. 12/06/23   Regalado, Belkys A, MD  vitamin A  3 MG (10000 UNITS) capsule Take 1 capsule (10,000 Units total) by mouth daily. 02/03/24 03/04/24  Leotis Bogus, MD  vitamin E  45 MG (100 UNITS) capsule Take 1 capsule (100 Units total) by mouth daily. 02/03/24 03/04/24  Leotis Bogus, MD  zinc  sulfate, 50mg  elemental zinc , 220 (50 Zn) MG capsule Take 1 capsule (220 mg total) by mouth daily. 12/06/23   Regalado, Belkys A, MD    Allergies: Amitriptyline , Ciprofloxacin , Ibuprofen , Aspirin , Na ferric gluc cplx in sucrose, Acetaminophen , Semaglutide, and Tramadol     Review of Systems  Updated Vital Signs BP 113/69   Pulse (!) 54   Temp 98.1 F (36.7 C) (Oral)   Resp 19   Ht 5' (1.524 m)   Wt 59.9 kg   SpO2 100%   BMI 25.78 kg/m   Physical Exam Vitals and nursing note reviewed.  Constitutional:      General: She is not in acute distress.    Appearance: She is well-developed.  HENT:     Head: Normocephalic and atraumatic.     Right Ear: External ear normal.     Left Ear: External ear normal.     Nose: Nose normal.  Eyes:     Extraocular Movements: Extraocular movements intact.     Conjunctiva/sclera: Conjunctivae normal.     Pupils:  Pupils are equal, round, and reactive to light.     Comments: Able to see light only out of both eyes  Cardiovascular:     Rate and Rhythm: Normal rate and regular rhythm.     Heart sounds: No murmur heard.    Comments: Tunneled HD catheter in left chest wall Pulmonary:     Effort: Pulmonary effort is normal. No respiratory distress.     Breath sounds: Normal breath sounds.  Genitourinary:    Comments: Stage IV pressure ulcer with discharge. See image below Musculoskeletal:     Cervical back: Normal range of motion  and neck supple.     Right lower leg: No edema.     Comments: Left lower extremity BKA  Skin:    General: Skin is warm and dry.  Neurological:     Mental Status: She is alert and oriented to person, place, and time.     Comments: CN 2-12 intact. RUE with 4/5 strength. LUE 5/5 strength. BL LE full strength.   Psychiatric:        Mood and Affect: Mood normal.    Pressure ulcer:   (all labs ordered are listed, but only abnormal results are displayed) Labs Reviewed  COMPREHENSIVE METABOLIC PANEL WITH GFR - Abnormal; Notable for the following components:      Result Value   Sodium 132 (*)    Potassium 6.1 (*)    CO2 20 (*)    Glucose, Bld 354 (*)    BUN 73 (*)    Creatinine, Ser 5.45 (*)    Calcium  7.9 (*)    Albumin  2.0 (*)    Alkaline Phosphatase 155 (*)    GFR, Estimated 9 (*)    All other components within normal limits  CBC WITH DIFFERENTIAL/PLATELET - Abnormal; Notable for the following components:   WBC 11.9 (*)    RBC 3.67 (*)    Hemoglobin 8.1 (*)    HCT 26.7 (*)    MCV 72.8 (*)    MCH 22.1 (*)    RDW 21.9 (*)    Monocytes Absolute 1.2 (*)    All other components within normal limits  BETA-HYDROXYBUTYRIC ACID - Abnormal; Notable for the following components:   Beta-Hydroxybutyric Acid 0.46 (*)    All other components within normal limits  CBG MONITORING, ED - Abnormal; Notable for the following components:   Glucose-Capillary 310 (*)    All  other components within normal limits  I-STAT CG4 LACTIC ACID, ED - Abnormal; Notable for the following components:   Lactic Acid, Venous 2.2 (*)    All other components within normal limits  I-STAT VENOUS BLOOD GAS, ED - Abnormal; Notable for the following components:   pO2, Ven 26 (*)    Sodium 133 (*)    Potassium 7.0 (*)    Calcium , Ion 1.04 (*)    HCT 30.0 (*)    Hemoglobin 10.2 (*)    All other components within normal limits  TROPONIN I (HIGH SENSITIVITY) - Abnormal; Notable for the following components:   Troponin I (High Sensitivity) 36 (*)    All other components within normal limits  TROPONIN I (HIGH SENSITIVITY) - Abnormal; Notable for the following components:   Troponin I (High Sensitivity) 38 (*)    All other components within normal limits  RESP PANEL BY RT-PCR (RSV, FLU A&B, COVID)  RVPGX2  CULTURE, BLOOD (ROUTINE X 2)  CULTURE, BLOOD (ROUTINE X 2)  RENAL FUNCTION PANEL  I-STAT CG4 LACTIC ACID, ED    EKG: EKG Interpretation Date/Time:  Thursday February 16 2024 13:59:47 EDT Ventricular Rate:  83 PR Interval:  147 QRS Duration:  82 QT Interval:  467 QTC Calculation: 381 R Axis:   5  Text Interpretation: Sinus rhythm Ventricular bigeminy Probable anteroseptal infarct, recent Nonspecific ST abnormality Lateral leads are also involved Confirmed by Yolande Charleston [phone]) on 02/16/2024 2:09:42 PM  Radiology: [facility] Chest Port 1 View Result Date: 02/16/2024 CLINICAL DATA:  Sepsis. EXAM: PORTABLE CHEST 1 VIEW COMPARISON:  January 27, 2024. FINDINGS: The heart size and mediastinal contours are within normal limits. Both lungs are clear. The  visualized skeletal structures are unremarkable. IMPRESSION: No active disease. Electronically Signed   By: Lynwood Landy Raddle M.D.   On: 02/16/2024 13:03     .Ultrasound ED Peripheral IV (Provider)  Date/Time: 02/16/2024 2:33 PM  Performed by: Yolande Lamar BROCKS, MD Authorized by: Yolande Lamar BROCKS, MD   Procedure details:     Indications: poor IV access     Skin Prep: chlorhexidine  gluconate     Location:  Right AC   Angiocath:  18 G   Bedside Ultrasound Guided: Yes     Images: not archived     Patient tolerated procedure without complications: Yes     Dressing applied: Yes      Medications Ordered in the ED  vancomycin  (VANCOCIN ) IVPB 1000 mg/200 mL premix (has no administration in time range)  0.9 %  sodium chloride  infusion (250 mLs Intravenous New Bag/Given 02/16/24 1508)  norepinephrine  (LEVOPHED ) 4mg  in (0.016 mg/mL) premix infusion (2 mcg/min Intravenous Rate/Dose Change 02/16/24 1517)  iohexol  (OMNIPAQUE ) 350 MG/ML injection 75 mL (has no administration in time range)  Chlorhexidine  Gluconate Cloth 2 % PADS 6 each (has no administration in time range)  calcitRIOL  (ROCALTROL ) capsule 0.5 mcg (has no administration in time range)  heparin  injection 1,000-6,000 Units (has no administration in time range)  sodium chloride  0.9 % primer fluid for CRRT (has no administration in time range)  prismasol  BGK 4/2.5 infusion (has no administration in time range)  prismasol  BGK 4/2.5 infusion (has no administration in time range)  PrismaSol  BGK 2/3.5 infusion (has no administration in time range)  sodium chloride  0.9 % bolus 500 mL (has no administration in time range)  sodium chloride  0.9 % bolus 1,000 mL (0 mLs Intravenous Stopped 02/16/24 1430)    And  sodium chloride  0.9 % bolus 500 mL (0 mLs Intravenous Stopped 02/16/24 1506)  ceFEPIme  (MAXIPIME ) 2 g in sodium chloride  0.9 % 100 mL IVPB (0 g Intravenous Stopped 02/16/24 1436)  metroNIDAZOLE  (FLAGYL ) IVPB 500 mg (0 mg Intravenous Stopped 02/16/24 1541)  calcium  gluconate 1 g/ 50 mL sodium chloride  IVPB (0 mg Intravenous Stopped 02/16/24 1403)  insulin  aspart (novoLOG ) injection 4 Units (4 Units Intravenous Given 02/16/24 1431)  sodium zirconium cyclosilicate  (LOKELMA ) packet 10 g (10 g Oral Given 02/16/24 1455)  albuterol  (PROVENTIL ) (2.5 MG/3ML) 0.083%  nebulizer solution 15 mg (15 mg Nebulization Given 02/16/24 1519)    Clinical Course as of 02/16/24 1603  Thu Feb 16, 2024  1437 Dr Melia from nephrology is arranging for dialysis for the patient. [RP]  1549 Signed out to Dr Bari [RP]    Clinical Course User Index [RP] Yolande Lamar BROCKS, MD                                 Medical Decision Making Amount and/or Complexity of Data Reviewed Labs: ordered. Radiology: ordered.  Risk Prescription drug management. Decision regarding hospitalization.   58 year old female with history of ESRD on TTS HD, anemia of CKD, diabetes, left eye blindness, hypertension, lumbar radiculopathy, left BKA, and CVA without residual deficits who presents to the emergency department with generalized weakness and vision loss.   Initial Ddx:  Stroke, ICH, hyperglycemia septic shock, infected sacral ulcer, hyperkalemia, uremia, MI, COVID  MDM/Course:  Patient presents emergency department with generalized weakness and hypotension.  Also reports some vision loss yesterday.  It is very difficult to assess how much of this is acute because she has  significantly impaired vision at baseline.  On exam does have some right upper extremity drift.  Unfortunately is out of the window for TNK.  Also is not a candidate for thrombectomy due to the fact that she does stay at a facility and is wheelchair-bound at baseline.  Does appear to have some drainage from her sacral wound which may represent infection.  Was hypotensive as well so was given 30 mL/kg bolus and broad-spectrum antibiotics.  Lactic acid was normal.  Found to be hyperglycemic but does not appear to be in DKA.  Did have a potassium of 6.1 was given calcium , Lokelma , albuterol , and insulin .  Discussed with nephrology who will arrange for dialysis or CRRT for her.  After the fluid bolus and upon re-evaluation remained hypotensive so was started on peripheral Levophed .  Is currently on 2 mcg/min of this and may be  able to wean to off soon.  Signed out to the oncoming Friday awaiting CT abdomen pelvis to assess for extent of sacral wound infection as well as CTA of the head and neck as part of a stroke workup for her vision loss.  This patient presents to the ED for concern of complaints listed in HPI, this involves an extensive number of treatment options, and is a complaint that carries with it a high risk of complications and morbidity. Disposition including potential need for admission considered.   Dispo: Pending remainder of workup  Additional history obtained from EMS Records reviewed Outpatient Clinic Notes The following labs were independently interpreted: Chemistry and show CKD and Hyperglycemia I independently reviewed the following imaging with scope of interpretation limited to determining acute life threatening conditions related to emergency care: Chest x-ray and agree with the radiologist interpretation with the following exceptions: none I personally reviewed and interpreted cardiac monitoring: normal sinus rhythm  I personally reviewed and interpreted the pt's EKG: see above for interpretation  I have reviewed the patients home medications and made adjustments as needed  CRITICAL CARE Performed by: Lamar JAYSON Shan   Total critical care time: 30 minutes  Critical care time was exclusive of separately billable procedures and treating other patients.  Critical care was necessary to treat or prevent imminent or life-threatening deterioration.  Critical care was time spent personally by me on the following activities: development of treatment plan with patient and/or surrogate as well as nursing, discussions with consultants, evaluation of patient's response to treatment, examination of patient, obtaining history from patient or surrogate, ordering and performing treatments and interventions, ordering and review of laboratory studies, ordering and review of radiographic studies, pulse  oximetry and re-evaluation of patient's condition.   Portions of this note were generated with Scientist, clinical (histocompatibility and immunogenetics). Dictation errors may occur despite best attempts at proofreading.     Final diagnoses:  Hyperkalemia  Hypotension, unspecified hypotension type  ESRD on hemodialysis (HCC)  Skin ulcer of sacrum, unspecified ulcer stage [facility])    ED Discharge Orders     None          Shan Lamar JAYSON, MD 02/16/24 [phone]

## 2024-02-16 NOTE — H&P (Addendum)
 NAME:  [name] [name], MRN:  [id number], DOB:  1966/07/01, LOS: 0 ADMISSION DATE:  02/16/2024, CONSULTATION DATE:  02/16/24 REFERRING MD:  EDP, CHIEF COMPLAINT:  vision issues   History of Present Illness:  58 year old woman who comes from ESRD for hypotension and vision loss.  Vision changes started yesterday am.  Myriad of severe chronic conditions including sacral ulcer, ESRD on HD, prior L BKA, prior strokes,  DM2, HTN heart murmur.  Patient has been hypotensive despite fluids and started on broad spectrum abx, pressors.  CT/CTA head and pelvis pending.  Outside of window for intervention in case this was a stroke.  Renal has seen and plans for CRRT given potassium of 6.  PCCM to admit.  ROS as below.  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 rate 12, height 5' (1.524 m), weight 59.9 kg, SpO2 99%.        Intake/Output Summary (Last 24 hours) at 02/16/2024 1749 Last data filed at 02/16/2024 1709 Gross per 24 hour  Intake 1947.27 ml  Output --  Net 1947.27 ml   Filed Weights   02/16/24 1409  Weight: 59.9 kg    Examination: General: chronically ill but not acutely toxic appearing HENT: MM dry, trachea  midline Lungs: nonlabored, clear Cardiovascular: irregular tachy, afib on monitor Abdomen: soft, +BS Extremities: BKA noted, chronic PVD changes in remaining leg Neuro: globally weak, unable to see but eomi Skin: see media tab for sacral wound  Minimal trop leak  Resolved problem list   Assessment and Plan  Shock state presumed septic with source sacral wound Afib new diagnosis, suspected secondary to above Bedbound Stage IV sacral ulcer POA ESRD on HD with hyperkalemia Acute on chronic vision issues- r/o stroke, CT/CTA head/neck pending; remainder of exam nonfocal Severe deconditioning POA DM2, HTN Prior DVT on eliquis  PTA  - Albumin  25%, midodrine , levo for MAP 65 - CRRT even to positive pull through existing tunneled HD cath - Okay to resume eliquis  pending CT head - Vanc/cefepime  - f/u CT imaging head and pelvis - Echo limited - Additional 500cc LR - Renal diet - ICU admit for pressors - Wound consult, may need to engage CCS in am depending on CT findings - Overall lactate has cleared, she is nontoxic appearing so hopefully turns around quickly - Overall prognosis with her degree of chronic disease burden is pretty guarded - Husband updated by phone  Best Practice (right click and Reselect all SmartList Selections daily)   Diet/type: Regular consistency (see orders) DVT prophylaxis not indicated Pressure ulcer(s): present on admission  GI prophylaxis: N/A Lines: Dialysis Catheter  Foley:  N/A Code Status:  full code confirmed with patinet Last date of multidisciplinary goals of care discussion [updated patient and husband]  Labs   CBC: Recent Labs  Lab 02/16/24 1228 02/16/24 1305  WBC 11.9*  --   NEUTROABS 7.2  --   HGB 8.1* 10.2*  HCT 26.7* 30.0*  MCV 72.8*  --   PLT 374  --     Basic Metabolic Panel: Recent Labs  Lab 02/16/24 1228 02/16/24 1305  NA 132* 133*  K 6.1* 7.0*  CL 99  --   CO2 20*  --   GLUCOSE 354*  --   BUN 73*  --    CREATININE 5.45*  --   CALCIUM  7.9*  --    GFR: Estimated Creatinine Clearance: 9.2 mL/min (A) (by C-G formula based on SCr of 5.45 mg/dL (H)). Recent Labs  Lab 02/16/24 1228 02/16/24 1306 02/16/24 1431  WBC 11.9*  --   --   LATICACIDVEN  --  2.2* 1.9    Liver Function Tests: Recent Labs  Lab 02/16/24 1228  AST 33  ALT 19  ALKPHOS 155*  BILITOT 0.6  PROT 7.2  ALBUMIN  2.0*   No results for input(s): LIPASE, AMYLASE in the last 168 hours. No results for input(s): AMMONIA in the last 168 hours.  ABG    Component Value Date/Time   PHART 7.429 (H) 11/05/2009 1457   PCO2ART 35.8 11/05/2009 1457   PO2ART 67.0 (L) 11/05/2009 1457   HCO3 24.0 02/16/2024 1305   TCO2 25 02/16/2024 1305   ACIDBASEDEF 2.0 02/16/2024 1305   O2SAT 43 02/16/2024 1305     Coagulation Profile: No results for input(s): INR, PROTIME in the last 168 hours.  Cardiac Enzymes: No results for input(s): CKTOTAL, CKMB, CKMBINDEX, TROPONINI in the last 168 hours.  HbA1C: Hgb A1c MFr Bld  Date/Time Value Ref Range Status  11/22/2023 12:49 AM 7.1 (H) 4.8 - 5.6 % Final    Comment:    (NOTE) Pre diabetes:          5.7%-6.4%  Diabetes:              >6.4%  Glycemic control for   <7.0% adults with diabetes   04/11/2021 11:17 AM 6.1 (H) 4.8 - 5.6 % Final    Comment:    (NOTE) Pre diabetes:          5.7%-6.4%  Diabetes:              >6.4%  Glycemic control for   <7.0% adults with diabetes     CBG: Recent Labs  Lab 02/16/24 1226  GLUCAP 310*    Review of Systems:    Positive Symptoms in bold:  Constitutional fevers, chills, weight loss, fatigue, anorexia, malaise  Eyes decreased vision, double vision, eye irritation  Ears, Nose, Mouth, Throat sore throat, trouble swallowing, sinus congestion  Cardiovascular chest pain, paroxysmal nocturnal dyspnea, lower ext edema, palpitations   Respiratory SOB, cough, DOE, hemoptysis, wheezing  Gastrointestinal nausea, vomiting,  diarrhea  Genitourinary burning with urination, trouble urinating  Musculoskeletal joint aches, joint swelling, back pain  Integumentary  rashes, skin lesions  Neurological focal weakness, focal numbness, trouble speaking, headaches  Psychiatric depression, anxiety, confusion  Endocrine polyuria, polydipsia, cold intolerance, heat intolerance  Hematologic abnormal bruising, abnormal bleeding, unexplained nose bleeds  Allergic/Immunologic recurrent infections, hives, swollen lymph nodes     Past Medical History:  She,  has a past medical history of Colitis, Diabetes mellitus, ESRD (end stage renal  disease) [facility]), Family history of adverse reaction to anesthesia, Fracture of femoral neck, right (HCC) (10/02/2020), GERD (gastroesophageal reflux disease), Heart murmur, Hypertension, Impaired vision in both eyes (11/19/2015), Mitral regurgitation, Neuropathy in diabetes (HCC), and Syncope and collapse (07/28/2015).   Surgical History:   Past Surgical History:  Procedure Laterality Date   ABDOMINAL HYSTERECTOMY  1999   for fibroids. she thinks one ovary was left   AMPUTATION Left 11/25/2023   Procedure: AMPUTATION BELOW KNEE LEFT;  Surgeon: Harden Jerona GAILS, MD;  Location: [facility] OR;  Service: Orthopedics;  Laterality: Left;   CATARACT EXTRACTION     CESAREAN SECTION     CHOLECYSTECTOMY     EYE SURGERY Left retinal   GANGLION CYST EXCISION     HIP PINNING,CANNULATED Right 10/02/2020   Procedure: CANNULATED HIP PINNING;  Surgeon: Celena Sharper, MD;  Location: [facility] OR;  Service: Orthopedics;  Laterality: Right;   INCISION AND DRAINAGE ABSCESS N/A 04/10/2021   Procedure: INCISION AND DRAINAGE ABSCESS LABIA;  Surgeon: Curvin Deward MOULD, MD;  Location: [facility] OR;  Service: General;  Laterality: N/A;   IR FLUORO GUIDE CV LINE LEFT  10/13/2020   IR PTA VENOUS EXCEPT DIALYSIS CIRCUIT  10/13/2020   ORIF TIBIA PLATEAU Left 10/09/2020   Procedure: OPEN REDUCTION INTERNAL FIXATION (ORIF) TIBIAL PLATEAU;  Surgeon:  Celena Sharper, MD;  Location: [facility] OR;  Service: Orthopedics;  Laterality: Left;   TONSILLECTOMY     TUBAL LIGATION       Social History:   reports that she has never smoked. She has never used smokeless tobacco. She reports that she does not drink alcohol and does not use drugs.   Family History:  Her family history includes Aneurysm in her mother; Diabetes in her father; Heart attack in her father.   Allergies Allergies  Allergen Reactions   Amitriptyline  Other (See Comments), Swelling and Nausea And Vomiting    Hypotension, Mental Status Changes, Confusion - Allergic, per [facility]   Ciprofloxacin  Diarrhea, Nausea And Vomiting and Other (See Comments)    Severe vomiting requiring ED visit, IV reglan  and IV fluids- Allergic, per MAR   Ibuprofen  Itching   Aspirin  Itching and Other (See Comments)    Pt endorses getting itchy in the past with aspirin . Tolerated dose 04/06/22.   Na Ferric Gluc Cplx In Sucrose Other (See Comments)   Acetaminophen  Itching, Other (See Comments) and Nausea And Vomiting    Allergic, per MAR   Semaglutide Nausea Only and Other (See Comments)    Allergic, per MAR   Tramadol  Itching     Home Medications  Prior to Admission medications   Medication Sig Start Date End Date Taking? Authorizing Provider  amLODipine  (NORVASC ) 5 MG tablet Take 1 tablet (5 mg total) by mouth every evening. 12/06/23   Regalado, Belkys A, MD  amoxicillin -clavulanate (AUGMENTIN ) 500-125 MG tablet Take 1 tablet by mouth 3 (three) times daily. 02/15/24   Valdemar Rocky SAUNDERS, NP  ascorbic acid  (VITAMIN C ) 1000 MG tablet Take 1 tablet (1,000 mg total) by mouth daily. 12/06/23   Regalado, Belkys A, MD  atorvastatin  (LIPITOR) 20 MG tablet Take 1 tablet (20 mg total) by mouth daily. 12/06/23   Regalado, Belkys A, MD  calcitRIOL  (ROCALTROL ) 0.25 MCG capsule Take 1 capsule (0.25 mcg total) by mouth Every Tuesday,Thursday,and Saturday with dialysis. 12/06/23   Regalado, Owen A, MD  calcium  carbonate  (TUMS) 500 MG chewable tablet Chew 1 tablet (200 mg of elemental calcium  total) by mouth 3 (three) times daily with  meals. 12/06/23   Regalado, Owen A, MD  cloNIDine  (CATAPRES  - DOSED IN MG/24 HR) 0.3 mg/24hr patch Place 0.3 mg onto the skin every Wednesday.    [provider]  doxycycline  (VIBRA -TABS) 100 MG tablet Take 1 tablet (100 mg total) by mouth 2 (two) times daily. 02/15/24   Valdemar Rocky SAUNDERS, NP  ELIQUIS  5 MG TABS tablet Take 5 mg by mouth 2 (two) times daily. 11/21/23   [provider]  ferrous sulfate 325 (65 FE) MG EC tablet Take 325 mg by mouth in the morning and at bedtime.    [provider]  fluticasone (FLONASE) 50 MCG/ACT nasal spray Place 2 sprays into both nostrils in the morning and at bedtime.    [provider]  gabapentin  (NEURONTIN ) 100 MG capsule Take 1 capsule (100 mg total) by mouth 3 (three) times daily. For 1 week take 1 capsule by mouth at bedtime, following week twice daily, following week 3 times daily 01/04/24   Zamora, Erin R, NP  hydrOXYzine  (ATARAX ) 10 MG tablet Take 10 mg by mouth 3 (three) times daily. 01/13/24   [provider]  isosorbide  mononitrate (IMDUR ) 30 MG 24 hr tablet Take 30 mg by mouth See admin instructions. Take 30 mg by mouth at 8 AM on Sun/Mon/Wed/Fri 11/16/23   [provider]  lidocaine  (LIDODERM ) 5 % Place 1 patch onto the skin See admin instructions. Apply 1 new patch to bilateral feet at 8 AM daily- Remove & Discard patch within 12 hours or as directed by MD    [provider]  losartan  (COZAAR ) 50 MG tablet Take 1 tablet (50 mg total) by mouth daily. 02/04/24 03/05/24  Leotis Bogus, MD  metoprolol  tartrate (LOPRESSOR ) 50 MG tablet Take 1 tablet (50 mg total) by mouth 2 (two) times daily. 12/06/23   Regalado, Belkys A, MD  multivitamin (RENA-VIT) TABS tablet Take 1 tablet by mouth daily at 8 pm.    [provider]  nystatin cream (MYCOSTATIN) Apply 1 Application topically See admin  instructions. Apply to sacrum 2 times a day after cleansing ab=nd drying. Apply collagen particles after nystatin cream. 11/16/23   [provider]  omeprazole  (PRILOSEC) 40 MG capsule Take 40 mg by mouth in the morning and at bedtime.    [provider]  ondansetron  (ZOFRAN ) 4 MG tablet Take 1 tablet (4 mg total) by mouth every 8 (eight) hours as needed for nausea or vomiting. 05/22/22   Rai, Ripudeep K, MD  PROTEIN PO Take 30 mLs by mouth 2 (two) times daily.    [provider]  saccharomyces boulardii (FLORASTOR) 250 MG capsule Take 1 capsule (250 mg total) by mouth 2 (two) times daily. 02/03/24 03/04/24  Leotis Bogus, MD  thiamine  (VITAMIN B-1) 100 MG tablet Take 1 tablet (100 mg total) by mouth daily. 12/06/23   Regalado, Belkys A, MD  vitamin A  3 MG (10000 UNITS) capsule Take 1 capsule (10,000 Units total) by mouth daily. 02/03/24 03/04/24  Leotis Bogus, MD  vitamin E  45 MG (100 UNITS) capsule Take 1 capsule (100 Units total) by mouth daily. 02/03/24 03/04/24  Leotis Bogus, MD  zinc  sulfate, 50mg  elemental zinc , 220 (50 Zn) MG capsule Take 1 capsule (220 mg total) by mouth daily. 12/06/23   Regalado, Owen LABOR, MD     Critical care time: 31 mins

## 2024-02-16 NOTE — ED Notes (Signed)
 Family updated as to patient's status as requested by patient.

## 2024-02-16 NOTE — ED Notes (Signed)
 Unable to obtain blood cultures prior to antibiotics.

## 2024-02-16 NOTE — Progress Notes (Signed)
 Pharmacy Antibiotic Note  [name] [name] is a 58 y.o. female admitted on 02/16/2024 with sepsis.  Pharmacy has been consulted for Vancomycin  and Cefepime  dosing.  Plan: Vancomycin  750 IV every 24 hours to start 08/15 at 2100 FEP 2g q12 hours to start 08/15 at 2100 Patient started CRRT at 2018 on 08/14 Continue to monitor Bcx  Continue to monitor CRRT sessions  Height: 5' (152.4 cm) Weight: 61.3 kg (135 lb 2.3 oz) IBW/kg (Calculated) : 45.5  Temp (24hrs), Avg:98 F (36.7 C), Min:97.8 F (36.6 C), Max:98.1 F (36.7 C)  Recent Labs  Lab 02/16/24 1228 02/16/24 1306 02/16/24 1431 02/16/24 2019  WBC 11.9*  --   --   --   CREATININE 5.45*  --   --  5.17*  LATICACIDVEN  --  2.2* 1.9  --     Estimated Creatinine Clearance: 9.8 mL/min (A) (by C-G formula based on SCr of 5.17 mg/dL (H)).    Allergies  Allergen Reactions   Amitriptyline  Other (See Comments), Swelling and Nausea And Vomiting    Hypotension, Mental Status Changes, Confusion - Allergic, per MAR   Ciprofloxacin  Diarrhea, Nausea And Vomiting and Other (See Comments)    Severe vomiting requiring ED visit, IV reglan  and IV fluids- Allergic, per MAR   Ibuprofen  Itching   Aspirin  Itching and Other (See Comments)    Pt endorses getting itchy in the past with aspirin . Tolerated dose 04/06/22.   Na Ferric Gluc Cplx In Sucrose Other (See Comments)   Acetaminophen  Itching, Other (See Comments) and Nausea And Vomiting    Allergic, per [facility]   Semaglutide Nausea Only and Other (See Comments)    Allergic, per [facility]   Tramadol  Itching    Antimicrobials this admission: 08/14 FEP 2 g x 1 08/14 VAN 1 g x 1 08/14 MTZ 500 mg x 1 08/15 VAN 750 mg q 24 >>  08/15 FEP 2 g q12  >>   Dose adjustments this admission: Patient started CRRT at 2016 on 08/14  Microbiology results: 08/14 BCx: in process 08/14 MRSA PCR: needs to be collected  Thank you for allowing pharmacy to be a part of this patient's care.  R. Samual Satterfield,  PharmD PGY-1 Acute Care Pharmacy Resident [facility] 02/16/2024 9:08 PM

## 2024-02-16 NOTE — Consult Note (Addendum)
 [name] [name] Renal Consultation Note    Indication for Consultation:  Management of ESRD/hemodialysis, anemia, volume, and secondary hyperparathyroidism.  HD orders GO  TTS  Heparin  1000U 3.75 hours 2K/2.5Ca bath Left TDC 350/600 EDW 57 - She was above her EDW by 6 kg last tx Mircera 100 mcg q 2 weeks - last dose 75 mcg - last given 02/04/24 Calcitriol  0.75 mcg three times per week  Amlodipine  10 mg Catapres  0.3 mg 24-hr patch Ferrous sulfate 325 mg daily Isosorbide  mononitrate ER 30 mg daily Metoprolol  50 mg BID Rena-Vite 0.8 mg daily  Assessment/Plan:  Hypotension: patient went to her regular HD clinic and was found to be hypotensive and very weak. Patient brought to the ED and she was given IVF and placed on levophed  gtt; currently on 3 mcg of Levophed  being slowly titrated off as tolerated Rule-out Sepsis: patient placed on empiric antibiotics. Unfortunately BC were not drawn prior to antibiotics.  Patients WBC was 11.9. Cxr did not show pneumonia or patchy opacities. Patient with significant sacral decub.   ESRD:  She told me that she attended her last HD, but per records but only see that she got HD on 8/9 and was 6 kg above her EDW.  Will give a dose of Lokelma  Will start CRRT at least for 12 hrs; she's on Levophed  and will be much quicker to get her on dialysis with CRRT  Additional recommendations - Dose all meds for creatinine clearance < 10 ml/min  - Unless absolutely necessary, no MRIs with gadolinium.  - Prefer needle sticks in the dorsum of the hands or wrists.  No blood pressure measurements in right arm. - If blood transfusion is requested during hemodialysis sessions, please alert us  prior to the session.  - If a hemodialysis catheter line culture is requested, please alert us  as only hemodialysis nurses are able to collect those specimens.   Avoid nephrotoxic medications including NSAIDs and iodinated intravenous contrast exposure unless the latter  is absolutely indicated.  Preferred narcotic agents for pain control are hydromorphone , fentanyl , and methadone. Morphine  should not be used. Avoid Baclofen and avoid oral sodium phosphate  and magnesium  citrate based laxatives / bowel preps. Continue strict Input and Output monitoring. Will monitor the patient closely with you and intervene or adjust therapy as indicated by changes in clinical status/labs    Hypertension/volume: patient hypotensive right now. She was give IVF and placed on pressors. Last HD she was above her EDW.   Anemia: Hgb 10.2 today.   Metabolic bone disease: patient not on phos binder; last PTH  656, phos 4.2, and Ca 8.0. On calcitriol  0.5 mcg TIW  Nutrition:  She reports that she has not had anything to eat in 2-days. Renal diet with fluid restrictions.   I have independently taken a history, reviewed the chart and examined the patient. This patient encounter includes evaluation, adjustment of therapies in at least one of the key components with review of  extender Belvie Dickinson's impression and recommendations.    I've also already edited and made adjustments to the above mentioned note.  [name] LYNWOOD ORN, MD 02/16/2024, 3:35 PM  HPI: [name] [name] is a 58 y.o. female with PMH of ESRD on HD (TTS), anemia of CKD, diabetes, left eye blindness, hypertension, lumbar radiculopathy, left BKA, CVA without residual deficits. She was riding back to her facility when she lost vision in her right eye. She also reports that she has been more weak recently. She tried to attend her  HD today, but she was found to be hypotensive and had an elevated CBG. She was sent to the ED. In the ED she was found to have a lactic acid of 2.2 and was hypotensive. She was given fluid bolus and started on Levophed . She was recently admitted for sacral osteomyelitis. She has a sacral wound currently.  Patient was seen in her room this afternoon. She reports that she is hungry and told me that she has not eaten  in two days. I ordered her a diet. She denies any SOB or dyspnea. Last HD I saw documented was 02/11/24. She told me that she had not missed any treatments recently.   Past Medical History:  Diagnosis Date   Colitis    Diabetes mellitus    ESRD (end stage renal disease) (HCC)    on HD (M,W,F)   Family history of adverse reaction to anesthesia     my uncle's heart stoped    Fracture of femoral neck, right (HCC) 10/02/2020   GERD (gastroesophageal reflux disease)    Heart murmur    Hypertension    Impaired vision in both eyes 11/19/2015   Mitral regurgitation    Neuropathy in diabetes [facility])    Syncope and collapse 07/28/2015   Past Surgical History:  Procedure Laterality Date   ABDOMINAL HYSTERECTOMY  1999   for fibroids. she thinks one ovary was left   AMPUTATION Left 11/25/2023   Procedure: AMPUTATION BELOW KNEE LEFT;  Surgeon: Harden Jerona GAILS, MD;  Location: [facility] OR;  Service: Orthopedics;  Laterality: Left;   CATARACT EXTRACTION     CESAREAN SECTION     CHOLECYSTECTOMY     EYE SURGERY Left retinal   GANGLION CYST EXCISION     HIP PINNING,CANNULATED Right 10/02/2020   Procedure: CANNULATED HIP PINNING;  Surgeon: Celena Sharper, MD;  Location: [facility] OR;  Service: Orthopedics;  Laterality: Right;   INCISION AND DRAINAGE ABSCESS N/A 04/10/2021   Procedure: INCISION AND DRAINAGE ABSCESS LABIA;  Surgeon: Curvin Deward MOULD, MD;  Location: [facility] OR;  Service: General;  Laterality: N/A;   IR FLUORO GUIDE CV LINE LEFT  10/13/2020   IR PTA VENOUS EXCEPT DIALYSIS CIRCUIT  10/13/2020   ORIF TIBIA PLATEAU Left 10/09/2020   Procedure: OPEN REDUCTION INTERNAL FIXATION (ORIF) TIBIAL PLATEAU;  Surgeon: Celena Sharper, MD;  Location: [facility] OR;  Service: Orthopedics;  Laterality: Left;   TONSILLECTOMY     TUBAL LIGATION     Family History  Problem Relation Age of Onset   Diabetes Father    Heart attack Father    Aneurysm Mother    Social History:  reports that she has never smoked. She has never used  smokeless tobacco. She reports that she does not drink alcohol and does not use drugs.   Physical Exam: Vitals:   02/16/24 1224 02/16/24 1228 02/16/24 1228 02/16/24 1245  BP: (!) 84/58   (!) 93/42  Pulse: (!) 55 (!) 55  (!) 52  Resp: (!) 9 14  16   Temp:   98.1 F (36.7 C)   TempSrc:   Oral   SpO2: 100% 100%  100%     General: Well developed, well nourished, in no acute distress. Head: Normocephalic, atraumatic, sclera non-icteric, mucus membranes are dry Neck: without lymphadenopathy/masses. JVD not elevated. Lungs: Clear bilaterally to auscultation without wheezes, rales, or rhonchi. No increased work of breathing Heart: RRR with normal S1, S2. No murmurs, rubs, or gallops  Abdomen: Soft, non-tender, non-distended with normoactive bowel sounds. No  rebound/guarding. No obvious abdominal masses. Musculoskeletal:  RUE 4/5 strength and LUE 5/5 Lower extremities: Right foot in boot and left AKA Neuro: Alert and oriented X 3. Moves all extremities spontaneously. Psych:  CN 2-12 intact.  Dialysis Access: Left TDC  Allergies  Allergen Reactions   Amitriptyline  Other (See Comments), Swelling and Nausea And Vomiting    Hypotension, Mental Status Changes, Confusion - Allergic, per MAR   Ciprofloxacin  Diarrhea, Nausea And Vomiting and Other (See Comments)    Severe vomiting requiring ED visit, IV reglan  and IV fluids- Allergic, per MAR   Ibuprofen  Itching   Aspirin  Itching and Other (See Comments)    Pt endorses getting itchy in the past with aspirin . Tolerated dose 04/06/22.   Na Ferric Gluc Cplx In Sucrose Other (See Comments)   Acetaminophen  Itching, Other (See Comments) and Nausea And Vomiting    Allergic, per MAR   Semaglutide Nausea Only and Other (See Comments)    Allergic, per MAR   Tramadol  Itching   Prior to Admission medications   Medication Sig Start Date End Date Taking? Authorizing Provider  amLODipine  (NORVASC ) 5 MG tablet Take 1 tablet (5 mg total) by mouth  every evening. 12/06/23   Regalado, Belkys A, MD  amoxicillin -clavulanate (AUGMENTIN ) 500-125 MG tablet Take 1 tablet by mouth 3 (three) times daily. 02/15/24   Valdemar Rocky SAUNDERS, NP  ascorbic acid  (VITAMIN C ) 1000 MG tablet Take 1 tablet (1,000 mg total) by mouth daily. 12/06/23   Regalado, Belkys A, MD  atorvastatin  (LIPITOR) 20 MG tablet Take 1 tablet (20 mg total) by mouth daily. 12/06/23   Regalado, Owen A, MD  calcitRIOL  (ROCALTROL ) 0.25 MCG capsule Take 1 capsule (0.25 mcg total) by mouth Every Tuesday,Thursday,and Saturday with dialysis. 12/06/23   Regalado, Owen A, MD  calcium  carbonate (TUMS) 500 MG chewable tablet Chew 1 tablet (200 mg of elemental calcium  total) by mouth 3 (three) times daily with meals. 12/06/23   Regalado, Belkys A, MD  cloNIDine  (CATAPRES  - DOSED IN MG/24 HR) 0.3 mg/24hr patch Place 0.3 mg onto the skin every Wednesday.    [provider]  doxycycline  (VIBRA -TABS) 100 MG tablet Take 1 tablet (100 mg total) by mouth 2 (two) times daily. 02/15/24   Valdemar Rocky SAUNDERS, NP  ELIQUIS  5 MG TABS tablet Take 5 mg by mouth 2 (two) times daily. 11/21/23   [provider]  ferrous sulfate 325 (65 FE) MG EC tablet Take 325 mg by mouth in the morning and at bedtime.    [provider]  fluticasone (FLONASE) 50 MCG/ACT nasal spray Place 2 sprays into both nostrils in the morning and at bedtime.    [provider]  gabapentin  (NEURONTIN ) 100 MG capsule Take 1 capsule (100 mg total) by mouth 3 (three) times daily. For 1 week take 1 capsule by mouth at bedtime, following week twice daily, following week 3 times daily 01/04/24   Zamora, Erin R, NP  hydrOXYzine  (ATARAX ) 10 MG tablet Take 10 mg by mouth 3 (three) times daily. 01/13/24   [provider]  isosorbide  mononitrate (IMDUR ) 30 MG 24 hr tablet Take 30 mg by mouth See admin instructions. Take 30 mg by mouth at 8 AM on Sun/Mon/Wed/Fri 11/16/23   [provider]  lidocaine  (LIDODERM ) 5 % Place 1 patch  onto the skin See admin instructions. Apply 1 new patch to bilateral feet at 8 AM daily- Remove & Discard patch within 12 hours or as directed by MD    [provider]  losartan  (COZAAR ) 50 MG tablet Take 1 tablet (50 mg total) by mouth daily. 02/04/24 03/05/24  Leotis Bogus, MD  metoprolol  tartrate (LOPRESSOR ) 50 MG tablet Take 1 tablet (50 mg total) by mouth 2 (two) times daily. 12/06/23   Regalado, Belkys A, MD  multivitamin (RENA-VIT) TABS tablet Take 1 tablet by mouth daily at 8 pm.    [provider]  nystatin cream (MYCOSTATIN) Apply 1 Application topically See admin instructions. Apply to sacrum 2 times a day after cleansing ab=nd drying. Apply collagen particles after nystatin cream. 11/16/23   [provider]  omeprazole  (PRILOSEC) 40 MG capsule Take 40 mg by mouth in the morning and at bedtime.    [provider]  ondansetron  (ZOFRAN ) 4 MG tablet Take 1 tablet (4 mg total) by mouth every 8 (eight) hours as needed for nausea or vomiting. 05/22/22   Rai, Ripudeep K, MD  PROTEIN PO Take 30 mLs by mouth 2 (two) times daily.    [provider]  saccharomyces boulardii (FLORASTOR) 250 MG capsule Take 1 capsule (250 mg total) by mouth 2 (two) times daily. 02/03/24 03/04/24  Leotis Bogus, MD  thiamine  (VITAMIN B-1) 100 MG tablet Take 1 tablet (100 mg total) by mouth daily. 12/06/23   Regalado, Belkys A, MD  vitamin A  3 MG (10000 UNITS) capsule Take 1 capsule (10,000 Units total) by mouth daily. 02/03/24 03/04/24  Leotis Bogus, MD  vitamin E  45 MG (100 UNITS) capsule Take 1 capsule (100 Units total) by mouth daily. 02/03/24 03/04/24  Leotis Bogus, MD  zinc  sulfate, 50mg  elemental zinc , 220 (50 Zn) MG capsule Take 1 capsule (220 mg total) by mouth daily. 12/06/23   Regalado, Owen LABOR, MD   Current Facility-Administered Medications  Medication Dose Route Frequency Provider Last Rate Last Admin   calcium  gluconate 1 g/ 50 mL sodium chloride  IVPB  1 g Intravenous  Once Yolande Lamar BROCKS, MD       ceFEPIme  (MAXIPIME ) 2 g in sodium chloride  0.9 % 100 mL IVPB  2 g Intravenous Once Yolande Lamar BROCKS, MD       metroNIDAZOLE  (FLAGYL ) IVPB 500 mg  500 mg Intravenous Once Yolande Lamar BROCKS, MD       sodium chloride  0.9 % bolus 1,000 mL  1,000 mL Intravenous Once Yolande Lamar BROCKS, MD 2,000 mL/hr at 02/16/24 1331 1,000 mL at 02/16/24 1331   And   sodium chloride  0.9 % bolus 500 mL  500 mL Intravenous Once Yolande Lamar BROCKS, MD       vancomycin  (VANCOCIN ) IVPB 1000 mg/200 mL premix  1,000 mg Intravenous Once Yolande Lamar BROCKS, MD       Current Outpatient Medications  Medication Sig Dispense Refill   amLODipine  (NORVASC ) 5 MG tablet Take 1 tablet (5 mg total) by mouth every evening. 30 tablet 0   amoxicillin -clavulanate (AUGMENTIN ) 500-125 MG tablet Take 1 tablet by mouth 3 (three) times daily. 30 tablet 0   ascorbic acid  (VITAMIN C ) 1000 MG tablet Take 1 tablet (1,000 mg total) by mouth daily. 30 tablet 0   atorvastatin  (LIPITOR) 20 MG tablet Take 1 tablet (20 mg total) by mouth daily. 30 tablet 1   calcitRIOL  (ROCALTROL ) 0.25 MCG capsule Take 1 capsule (0.25 mcg total) by mouth Every Tuesday,Thursday,and Saturday with dialysis. 30 capsule 0   calcium  carbonate (TUMS) 500 MG chewable tablet Chew 1 tablet (200 mg of elemental calcium  total) by mouth 3 (three) times daily with meals. 90 tablet 0  cloNIDine  (CATAPRES  - DOSED IN MG/24 HR) 0.3 mg/24hr patch Place 0.3 mg onto the skin every Wednesday.     doxycycline  (VIBRA -TABS) 100 MG tablet Take 1 tablet (100 mg total) by mouth 2 (two) times daily. 28 tablet 0   ELIQUIS  5 MG TABS tablet Take 5 mg by mouth 2 (two) times daily.     ferrous sulfate 325 (65 FE) MG EC tablet Take 325 mg by mouth in the morning and at bedtime.     fluticasone (FLONASE) 50 MCG/ACT nasal spray Place 2 sprays into both nostrils in the morning and at bedtime.     gabapentin  (NEURONTIN ) 100 MG capsule Take 1 capsule (100 mg total) by  mouth 3 (three) times daily. For 1 week take 1 capsule by mouth at bedtime, following week twice daily, following week 3 times daily 90 capsule 3   hydrOXYzine  (ATARAX ) 10 MG tablet Take 10 mg by mouth 3 (three) times daily.     isosorbide  mononitrate (IMDUR ) 30 MG 24 hr tablet Take 30 mg by mouth See admin instructions. Take 30 mg by mouth at 8 AM on Sun/Mon/Wed/Fri     lidocaine  (LIDODERM ) 5 % Place 1 patch onto the skin See admin instructions. Apply 1 new patch to bilateral feet at 8 AM daily- Remove & Discard patch within 12 hours or as directed by MD     losartan  (COZAAR ) 50 MG tablet Take 1 tablet (50 mg total) by mouth daily. 30 tablet 0   metoprolol  tartrate (LOPRESSOR ) 50 MG tablet Take 1 tablet (50 mg total) by mouth 2 (two) times daily. 60 tablet 0   multivitamin (RENA-VIT) TABS tablet Take 1 tablet by mouth daily at 8 pm.     nystatin cream (MYCOSTATIN) Apply 1 Application topically See admin instructions. Apply to sacrum 2 times a day after cleansing ab=nd drying. Apply collagen particles after nystatin cream.     omeprazole  (PRILOSEC) 40 MG capsule Take 40 mg by mouth in the morning and at bedtime.     ondansetron  (ZOFRAN ) 4 MG tablet Take 1 tablet (4 mg total) by mouth every 8 (eight) hours as needed for nausea or vomiting. 30 tablet 0   PROTEIN PO Take 30 mLs by mouth 2 (two) times daily.     saccharomyces boulardii (FLORASTOR) 250 MG capsule Take 1 capsule (250 mg total) by mouth 2 (two) times daily. 60 capsule 0   thiamine  (VITAMIN B-1) 100 MG tablet Take 1 tablet (100 mg total) by mouth daily. 30 tablet 0   vitamin A  3 MG (10000 UNITS) capsule Take 1 capsule (10,000 Units total) by mouth daily. 30 capsule 0   vitamin E  45 MG (100 UNITS) capsule Take 1 capsule (100 Units total) by mouth daily. 30 capsule 0   zinc  sulfate, 50mg  elemental zinc , 220 (50 Zn) MG capsule Take 1 capsule (220 mg total) by mouth daily. 30 capsule 0   Labs: Basic Metabolic Panel: Recent Labs  Lab  02/16/24 1305  NA 133*  K 7.0*    CBC: Recent Labs  Lab 02/16/24 1228 02/16/24 1305  WBC 11.9*  --   NEUTROABS PENDING  --   HGB 8.1* 10.2*  HCT 26.7* 30.0*  MCV 72.8*  --   PLT 374  --       Belvie Och, NP 02/16/2024, 1:39 PM  [name]

## 2024-02-17 ENCOUNTER — Inpatient Hospital Stay (HOSPITAL_COMMUNITY)

## 2024-02-17 ENCOUNTER — Encounter (HOSPITAL_COMMUNITY): Payer: Self-pay | Admitting: Internal Medicine

## 2024-02-17 DIAGNOSIS — R6521 Severe sepsis with septic shock: Secondary | ICD-10-CM | POA: Diagnosis not present

## 2024-02-17 DIAGNOSIS — A419 Sepsis, unspecified organism: Secondary | ICD-10-CM | POA: Diagnosis not present

## 2024-02-17 DIAGNOSIS — Z992 Dependence on renal dialysis: Secondary | ICD-10-CM | POA: Diagnosis not present

## 2024-02-17 DIAGNOSIS — N186 End stage renal disease: Secondary | ICD-10-CM | POA: Diagnosis not present

## 2024-02-17 HISTORY — PX: CONTINUOUS RENAL REPLACEMENT THERAPY: DIA8

## 2024-02-17 LAB — BASIC METABOLIC PANEL WITH GFR
Anion gap: 12 (ref 5–15)
BUN: 22 mg/dL — ABNORMAL HIGH (ref 6–20)
CO2: 22 mmol/L (ref 22–32)
Calcium: 8.9 mg/dL (ref 8.9–10.3)
Chloride: 100 mmol/L (ref 98–111)
Creatinine, Ser: 1.78 mg/dL — ABNORMAL HIGH (ref 0.44–1.00)
GFR, Estimated: 33 mL/min — ABNORMAL LOW (ref >60)
Glucose, Bld: 174 mg/dL — ABNORMAL HIGH (ref 70–99)
Potassium: 3.5 mmol/L (ref 3.5–5.1)
Sodium: 134 mmol/L — ABNORMAL LOW (ref 135–145)

## 2024-02-17 LAB — CBC
HCT: 20.5 % — ABNORMAL LOW (ref 36.0–46.0)
HCT: 26.8 % — ABNORMAL LOW (ref 36.0–46.0)
Hemoglobin: 6.4 g/dL — CL (ref 12.0–15.0)
Hemoglobin: 8.6 g/dL — ABNORMAL LOW (ref 12.0–15.0)
MCH: 22.5 pg — ABNORMAL LOW (ref 26.0–34.0)
MCH: 24 pg — ABNORMAL LOW (ref 26.0–34.0)
MCHC: 31.2 g/dL (ref 30.0–36.0)
MCHC: 32.1 g/dL (ref 30.0–36.0)
MCV: 71.9 fL — ABNORMAL LOW (ref 80.0–100.0)
MCV: 74.9 fL — ABNORMAL LOW (ref 80.0–100.0)
Platelets: 282 K/uL (ref 150–400)
Platelets: 243 K/uL (ref 150–400)
RBC: 2.85 MIL/uL — ABNORMAL LOW (ref 3.87–5.11)
RBC: 3.58 MIL/uL — ABNORMAL LOW (ref 3.87–5.11)
RDW: 21.5 % — ABNORMAL HIGH (ref 11.5–15.5)
RDW: 22.9 % — ABNORMAL HIGH (ref 11.5–15.5)
WBC: 15.2 K/uL — ABNORMAL HIGH (ref 4.0–10.5)
WBC: 19.4 K/uL — ABNORMAL HIGH (ref 4.0–10.5)
nRBC: 0 % (ref 0.0–0.2)
nRBC: 0 % (ref 0.0–0.2)

## 2024-02-17 LAB — RENAL FUNCTION PANEL
Albumin: 3.5 g/dL (ref 3.5–5.0)
Albumin: 3.3 g/dL — ABNORMAL LOW (ref 3.5–5.0)
Anion gap: 13 (ref 5–15)
Anion gap: 10 (ref 5–15)
BUN: 22 mg/dL — ABNORMAL HIGH (ref 6–20)
BUN: 21 mg/dL — ABNORMAL HIGH (ref 6–20)
CO2: 22 mmol/L (ref 22–32)
CO2: 24 mmol/L (ref 22–32)
Calcium: 8.9 mg/dL (ref 8.9–10.3)
Calcium: 9 mg/dL (ref 8.9–10.3)
Chloride: 99 mmol/L (ref 98–111)
Chloride: 100 mmol/L (ref 98–111)
Creatinine, Ser: 1.83 mg/dL — ABNORMAL HIGH (ref 0.44–1.00)
Creatinine, Ser: 1.74 mg/dL — ABNORMAL HIGH (ref 0.44–1.00)
GFR, Estimated: 32 mL/min — ABNORMAL LOW (ref >60)
GFR, Estimated: 34 mL/min — ABNORMAL LOW (ref >60)
Glucose, Bld: 174 mg/dL — ABNORMAL HIGH (ref 70–99)
Glucose, Bld: 164 mg/dL — ABNORMAL HIGH (ref 70–99)
Phosphorus: 2 mg/dL — ABNORMAL LOW (ref 2.5–4.6)
Phosphorus: 1.9 mg/dL — ABNORMAL LOW (ref 2.5–4.6)
Potassium: 3.5 mmol/L (ref 3.5–5.1)
Potassium: 3.6 mmol/L (ref 3.5–5.1)
Sodium: 134 mmol/L — ABNORMAL LOW (ref 135–145)
Sodium: 134 mmol/L — ABNORMAL LOW (ref 135–145)

## 2024-02-17 LAB — GLUCOSE, CAPILLARY
Glucose-Capillary: 175 mg/dL — ABNORMAL HIGH (ref 70–99)
Glucose-Capillary: 158 mg/dL — ABNORMAL HIGH (ref 70–99)
Glucose-Capillary: 149 mg/dL — ABNORMAL HIGH (ref 70–99)
Glucose-Capillary: 138 mg/dL — ABNORMAL HIGH (ref 70–99)
Glucose-Capillary: 136 mg/dL — ABNORMAL HIGH (ref 70–99)
Glucose-Capillary: 148 mg/dL — ABNORMAL HIGH (ref 70–99)

## 2024-02-17 LAB — C-REACTIVE PROTEIN: CRP: 20.8 mg/dL — ABNORMAL HIGH (ref <1.0)

## 2024-02-17 LAB — PREPARE RBC (CROSSMATCH)

## 2024-02-17 LAB — PHOSPHORUS: Phosphorus: 2 mg/dL — ABNORMAL LOW (ref 2.5–4.6)

## 2024-02-17 MED ORDER — HYDROMORPHONE HCL 1 MG/ML IJ SOLN
1.0000 mg | Freq: Two times a day (BID) | INTRAMUSCULAR | Status: DC | PRN
  Administered 2024-02-17: 1 mg via INTRAVENOUS
  Filled 2024-02-17: qty 1

## 2024-02-17 MED ORDER — MUPIROCIN 2 % EX OINT
TOPICAL_OINTMENT | Freq: Two times a day (BID) | CUTANEOUS | Status: DC
  Filled 2024-02-17: qty 22

## 2024-02-17 MED ORDER — DAKINS (1/4 STRENGTH) 0.125 % EX SOLN
Freq: Two times a day (BID) | CUTANEOUS | Status: DC
  Filled 2024-02-17: qty 473

## 2024-02-17 MED ORDER — MIDAZOLAM HCL 2 MG/2ML IJ SOLN: 0.5000 mg | Freq: Once | INTRAMUSCULAR | Status: AC

## 2024-02-17 MED ORDER — CHLORHEXIDINE GLUCONATE CLOTH 2 % EX PADS
6.0000 | MEDICATED_PAD | CUTANEOUS | Status: DC
  Administered 2024-02-18: 6 via TOPICAL

## 2024-02-17 MED ORDER — MIDODRINE HCL 5 MG PO TABS
5.0000 mg | ORAL_TABLET | Freq: Once | ORAL | Status: AC
  Administered 2024-02-17: 5 mg via ORAL
  Filled 2024-02-17: qty 1

## 2024-02-17 MED ORDER — NALOXONE HCL 0.4 MG/ML IJ SOLN: 0.4000 mg | INTRAMUSCULAR | Status: DC | PRN

## 2024-02-17 MED ORDER — KATE FARMS STANDARD 1.4 PO LIQD
325.0000 mL | Freq: Two times a day (BID) | ORAL | Status: DC
  Administered 2024-02-17 – 2024-02-18 (×3): 325 mL via ORAL
  Filled 2024-02-17 (×6): qty 325

## 2024-02-17 MED ORDER — GABAPENTIN 100 MG PO CAPS
100.0000 mg | ORAL_CAPSULE | Freq: Two times a day (BID) | ORAL | Status: DC
  Administered 2024-02-17 – 2024-02-18 (×3): 100 mg via ORAL
  Filled 2024-02-17 (×3): qty 1

## 2024-02-17 MED ORDER — MIDAZOLAM HCL 2 MG/2ML IJ SOLN
INTRAMUSCULAR | Status: AC
  Administered 2024-02-17: 0.5 mg via INTRAVENOUS
  Filled 2024-02-17: qty 2

## 2024-02-17 MED ORDER — SODIUM CHLORIDE 0.9% IV SOLUTION: Freq: Once | INTRAVENOUS | Status: AC

## 2024-02-17 MED ORDER — COLLAGENASE 250 UNIT/GM EX OINT
TOPICAL_OINTMENT | Freq: Every day | CUTANEOUS | Status: DC
  Filled 2024-02-17: qty 30

## 2024-02-17 MED ORDER — NALOXONE HCL 0.4 MG/ML IJ SOLN
INTRAMUSCULAR | Status: AC
  Administered 2024-02-17: 0.4 mg via INTRAVENOUS
  Filled 2024-02-17: qty 1

## 2024-02-17 MED ORDER — HYDROMORPHONE HCL 1 MG/ML IJ SOLN: 0.5000 mg | Freq: Two times a day (BID) | INTRAMUSCULAR | Status: DC | PRN

## 2024-02-17 MED ORDER — HYDROMORPHONE HCL 1 MG/ML IJ SOLN
0.2500 mg | Freq: Two times a day (BID) | INTRAMUSCULAR | Status: DC | PRN
  Administered 2024-02-18: 0.25 mg via INTRAVENOUS
  Filled 2024-02-17: qty 1

## 2024-02-17 MED ORDER — POTASSIUM PHOSPHATES 15 MMOLE/5ML IV SOLN
30.0000 mmol | Freq: Once | INTRAVENOUS | Status: AC
  Administered 2024-02-17: 30 mmol via INTRAVENOUS
  Filled 2024-02-17: qty 10

## 2024-02-17 MED ORDER — RENA-VITE PO TABS
1.0000 | ORAL_TABLET | Freq: Every day | ORAL | Status: DC
  Administered 2024-02-17: 1 via ORAL
  Filled 2024-02-17: qty 1

## 2024-02-17 MED ORDER — HEPARIN SODIUM (PORCINE) 5000 UNIT/ML IJ SOLN
5000.0000 [IU] | Freq: Three times a day (TID) | INTRAMUSCULAR | Status: DC
  Administered 2024-02-17 – 2024-02-18 (×5): 5000 [IU] via SUBCUTANEOUS
  Filled 2024-02-17 (×6): qty 1

## 2024-02-17 MED ORDER — MIDODRINE HCL 5 MG PO TABS
10.0000 mg | ORAL_TABLET | Freq: Three times a day (TID) | ORAL | Status: DC
  Administered 2024-02-17 – 2024-02-18 (×3): 10 mg via ORAL
  Filled 2024-02-17 (×3): qty 2

## 2024-02-17 MED ORDER — SODIUM CHLORIDE 0.9 % IV SOLN
2.0000 g | Freq: Three times a day (TID) | INTRAVENOUS | Status: DC
  Administered 2024-02-17 – 2024-02-19 (×5): 2 g via INTRAVENOUS
  Filled 2024-02-17 (×5): qty 12.5

## 2024-02-17 MED ORDER — ORAL CARE MOUTH RINSE: 15.0000 mL | OROMUCOSAL | Status: DC | PRN

## 2024-02-17 MED ORDER — JUVEN PO PACK
1.0000 | PACK | Freq: Two times a day (BID) | ORAL | Status: DC
  Administered 2024-02-17 – 2024-02-18 (×2): 1 via ORAL
  Filled 2024-02-17 (×2): qty 1

## 2024-02-17 NOTE — Progress Notes (Signed)
 Attempted 2D echo. Patient having bedside procedure. Patient to be seen after 3pm.

## 2024-02-17 NOTE — Progress Notes (Signed)
 Multiple attempts have been made to obtain labs.  Pt with extremely poor vasculature and refusing multiple/repeated sticks.

## 2024-02-17 NOTE — Progress Notes (Signed)
 Pharmacy Electrolyte Replacement  Recent Labs:  Recent Labs    02/17/24 1600  K 3.6  PHOS 1.9*  CREATININE 1.74*    Low Critical Values (K </= 2.5, Phos </= 1, Mg </= 1) Present: None  Plan:  KPhos 30mmol IVPB x 1  (per pharmacy consult phosphorous replacement while on CRRT therapy).  Vito Ralph, PharmD, BCPS Please see amion for complete clinical pharmacist phone list 02/17/2024 7:13 PM

## 2024-02-17 NOTE — Progress Notes (Signed)
 Informed about vaginal discharge  Requested cultures

## 2024-02-17 NOTE — Consult Note (Addendum)
 Reason for Consult:Left tibia wound Referring Physician: Jennet Epley Time called: [id number] Time at bedside: 1006   [name] [name] is an 58 y.o. female.  HPI: [name] [name] was admitted yesterday with sepsis that was thought to stem from her large sacral decub. She also has a partial BKA dehiscence with plan for revision amputation 8/22 with Dr. Harden. Orthopedic surgery was consulted to make sure nothing needed to be addressed sooner. She denies any changes to leg in last couple of days.  Past Medical History:  Diagnosis Date   Colitis    Diabetes mellitus    ESRD (end stage renal disease) (HCC)    on HD (M,W,F)   Family history of adverse reaction to anesthesia     my uncle's heart stoped    Fracture of femoral neck, right (HCC) 10/02/2020   GERD (gastroesophageal reflux disease)    Heart murmur    Hypertension    Impaired vision in both eyes 11/19/2015   Mitral regurgitation    Neuropathy in diabetes [facility])    Syncope and collapse 07/28/2015    Past Surgical History:  Procedure Laterality Date   ABDOMINAL HYSTERECTOMY  1999   for fibroids. she thinks one ovary was left   AMPUTATION Left 11/25/2023   Procedure: AMPUTATION BELOW KNEE LEFT;  Surgeon: Harden Jerona GAILS, MD;  Location: [facility] OR;  Service: Orthopedics;  Laterality: Left;   CATARACT EXTRACTION     CESAREAN SECTION     CHOLECYSTECTOMY     CONTINUOUS RENAL REPLACEMENT THERAPY  02/17/2024   EYE SURGERY Left retinal   GANGLION CYST EXCISION     HIP PINNING,CANNULATED Right 10/02/2020   Procedure: CANNULATED HIP PINNING;  Surgeon: Celena Sharper, MD;  Location: [facility] OR;  Service: Orthopedics;  Laterality: Right;   INCISION AND DRAINAGE ABSCESS N/A 04/10/2021   Procedure: INCISION AND DRAINAGE ABSCESS LABIA;  Surgeon: Curvin Deward MOULD, MD;  Location: [facility] OR;  Service: General;  Laterality: N/A;   IR FLUORO GUIDE CV LINE LEFT  10/13/2020   IR PTA VENOUS EXCEPT DIALYSIS CIRCUIT  10/13/2020   ORIF TIBIA PLATEAU Left 10/09/2020   Procedure:  OPEN REDUCTION INTERNAL FIXATION (ORIF) TIBIAL PLATEAU;  Surgeon: Celena Sharper, MD;  Location: [facility] OR;  Service: Orthopedics;  Laterality: Left;   TONSILLECTOMY     TUBAL LIGATION      Family History  Problem Relation Age of Onset   Diabetes Father    Heart attack Father    Aneurysm Mother     Social History:  reports that she has never smoked. She has never used smokeless tobacco. She reports that she does not drink alcohol and does not use drugs.  Allergies:  Allergies  Allergen Reactions   Amitriptyline  Other (See Comments), Swelling and Nausea And Vomiting    Hypotension, Mental Status Changes, Confusion - Allergic, per [facility]   Ciprofloxacin  Diarrhea, Nausea And Vomiting and Other (See Comments)    Severe vomiting requiring ED visit, IV reglan  and IV fluids- Allergic, per [facility]   Ibuprofen  Itching   Aspirin  Itching and Other (See Comments)    Pt endorses getting itchy in the past with aspirin . Tolerated dose 04/06/22.   Na Ferric Gluc Cplx In Sucrose Other (See Comments)   Acetaminophen  Itching, Other (See Comments) and Nausea And Vomiting    Allergic, per MAR   Semaglutide Nausea Only and Other (See Comments)    Allergic, per [facility]   Tramadol  Itching    Medications: I have reviewed the patient's current medications.  Results  for orders placed or performed during the hospital encounter of 02/16/24 (from the past 48 hours)  CBG monitoring, ED     Status: Abnormal   Collection Time: 02/16/24 12:26 PM  Result Value Ref Range   Glucose-Capillary 310 (H) 70 - 99 mg/dL    Comment: Glucose reference range applies only to samples taken after fasting for at least 8 hours.  Comprehensive metabolic panel     Status: Abnormal   Collection Time: 02/16/24 12:28 PM  Result Value Ref Range   Sodium 132 (L) 135 - 145 mmol/L   Potassium 6.1 (H) 3.5 - 5.1 mmol/L   Chloride 99 98 - 111 mmol/L   CO2 20 (L) 22 - 32 mmol/L   Glucose, Bld 354 (H) 70 - 99 mg/dL    Comment: Glucose  reference range applies only to samples taken after fasting for at least 8 hours.   BUN 73 (H) 6 - 20 mg/dL   Creatinine, Ser 4.54 (H) 0.44 - 1.00 mg/dL   Calcium  7.9 (L) 8.9 - 10.3 mg/dL   Total Protein 7.2 6.5 - 8.1 g/dL   Albumin  2.0 (L) 3.5 - 5.0 g/dL   AST 33 15 - 41 U/L   ALT 19 0 - 44 U/L   Alkaline Phosphatase 155 (H) 38 - 126 U/L   Total Bilirubin 0.6 0.0 - 1.2 mg/dL   GFR, Estimated 9 (L) >60 mL/min    Comment: (NOTE) Calculated using the CKD-EPI Creatinine Equation (2021)    Anion gap 13 5 - 15    Comment: Performed at [facility], [address] [place], [place] [address]  CBC with Differential     Status: Abnormal   Collection Time: 02/16/24 12:28 PM  Result Value Ref Range   WBC 11.9 (H) 4.0 - 10.5 K/uL   RBC 3.67 (L) 3.87 - 5.11 MIL/uL   Hemoglobin 8.1 (L) 12.0 - 15.0 g/dL    Comment: Reticulocyte Hemoglobin testing may be clinically indicated, consider ordering this additional test [id number]    HCT 26.7 (L) 36.0 - 46.0 %   MCV 72.8 (L) 80.0 - 100.0 fL   MCH 22.1 (L) 26.0 - 34.0 pg   MCHC 30.3 30.0 - 36.0 g/dL   RDW 78.0 (H) 88.4 - 84.4 %   Platelets 374 150 - 400 K/uL   nRBC 0.0 0.0 - 0.2 %   Neutrophils Relative % 61 %   Neutro Abs 7.2 1.7 - 7.7 K/uL   Lymphocytes Relative 27 %   Lymphs Abs 3.3 0.7 - 4.0 K/uL   Monocytes Relative 10 %   Monocytes Absolute 1.2 (H) 0.1 - 1.0 K/uL   Eosinophils Relative 1 %   Eosinophils Absolute 0.1 0.0 - 0.5 K/uL   Basophils Relative 1 %   Basophils Absolute 0.1 0.0 - 0.1 K/uL   RBC Morphology See Note    Immature Granulocytes 0 %   Abs Immature Granulocytes 0.05 0.00 - 0.07 K/uL   Burr Cells PRESENT    Target Cells PRESENT     Comment: Performed at [facility], [address], [place], [place] [address]  Resp panel by RT-PCR (RSV, Flu A&B, Covid) Anterior Nasal Swab     Status: None   Collection Time: 02/16/24 12:28 PM   Specimen: Anterior Nasal Swab  Result Value Ref Range   SARS Coronavirus 2 by  RT PCR NEGATIVE NEGATIVE   Influenza A by PCR NEGATIVE NEGATIVE   Influenza B by PCR NEGATIVE NEGATIVE  Comment: (NOTE) The Xpert Xpress SARS-CoV-2/FLU/RSV plus assay is intended as an aid in the diagnosis of influenza from Nasopharyngeal swab specimens and should not be used as a sole basis for treatment. Nasal washings and aspirates are unacceptable for Xpert Xpress SARS-CoV-2/FLU/RSV testing.  Fact Sheet for Patients: BloggerCourse.com  Fact Sheet for Healthcare Providers: SeriousBroker.it  This test is not yet approved or cleared by the [place]  FDA and has been authorized for detection and/or diagnosis of SARS-CoV-2 by FDA under an Emergency Use Authorization (EUA). This EUA will remain in effect (meaning this test can be used) for the duration of the COVID-19 declaration under Section 564(b)(1) of the Act, 21 U.S.C. section 360bbb-3(b)(1), unless the authorization is terminated or revoked.     Resp Syncytial Virus by PCR NEGATIVE NEGATIVE    Comment: (NOTE) Fact Sheet for Patients: BloggerCourse.com  Fact Sheet for Healthcare Providers: SeriousBroker.it  This test is not yet approved or cleared by the [place]  FDA and has been authorized for detection and/or diagnosis of SARS-CoV-2 by FDA under an Emergency Use Authorization (EUA). This EUA will remain in effect (meaning this test can be used) for the duration of the COVID-19 declaration under Section 564(b)(1) of the Act, 21 U.S.C. section 360bbb-3(b)(1), unless the authorization is terminated or revoked.  Performed at [facility], [address] [place], [place] [address]   Troponin I (High Sensitivity)     Status: Abnormal   Collection Time: 02/16/24 12:28 PM  Result Value Ref Range   Troponin I (High Sensitivity) 36 (H) <18 ng/L    Comment: (NOTE) Elevated high sensitivity troponin I (hsTnI)  values and significant  changes across serial measurements may suggest ACS but many other  chronic and acute conditions are known to elevate hsTnI results.  Refer to the Links section for chest pain algorithms and additional  guidance. Performed at [facility], [address], [place], [place] [address]   Beta-hydroxybutyric acid     Status: Abnormal   Collection Time: 02/16/24  1:00 PM  Result Value Ref Range   Beta-Hydroxybutyric Acid 0.46 (H) 0.05 - 0.27 mmol/L    Comment: Performed at [facility], [address], [place], [place] [address]  I-Stat venous blood gas, [facility] ED, [facility], [facility])     Status: Abnormal   Collection Time: 02/16/24  1:05 PM  Result Value Ref Range   pH, Ven 7.309 7.25 - 7.43   pCO2, Ven 47.9 44 - 60 mmHg   pO2, Ven 26 (LL) 32 - 45 mmHg   Bicarbonate 24.0 20.0 - 28.0 mmol/L   TCO2 25 22 - 32 mmol/L   O2 Saturation 43 %   Acid-base deficit 2.0 0.0 - 2.0 mmol/L   Sodium 133 (L) 135 - 145 mmol/L   Potassium 7.0 (HH) 3.5 - 5.1 mmol/L   Calcium , Ion 1.04 (L) 1.15 - 1.40 mmol/L   HCT 30.0 (L) 36.0 - 46.0 %   Hemoglobin 10.2 (L) 12.0 - 15.0 g/dL   Sample type VENOUS    Comment NOTIFIED PHYSICIAN   I-Stat Lactic Acid, ED     Status: Abnormal   Collection Time: 02/16/24  1:06 PM  Result Value Ref Range   Lactic Acid, Venous 2.2 (HH) 0.5 - 1.9 mmol/L   Comment NOTIFIED PHYSICIAN   Blood Culture (routine x 2)     Status: None (Preliminary result)   Collection Time: 02/16/24  2:25 PM   Specimen: BLOOD LEFT ARM  Result Value Ref  Range   Specimen Description BLOOD LEFT ARM    Special Requests      BOTTLES DRAWN AEROBIC AND ANAEROBIC Blood Culture adequate volume   Culture      NO GROWTH < 24 HOURS Performed at [facility], [address], [place], [place] [address]    Report Status PENDING   Troponin I (High Sensitivity)     Status: Abnormal   Collection Time: 02/16/24  2:29 PM  Result Value Ref Range   Troponin I (High Sensitivity) 38 (H)  <18 ng/L    Comment: (NOTE) Elevated high sensitivity troponin I (hsTnI) values and significant  changes across serial measurements may suggest ACS but many other  chronic and acute conditions are known to elevate hsTnI results.  Refer to the Links section for chest pain algorithms and additional  guidance. Performed at [facility], [address] [place], [place] [address]   I-Stat Lactic Acid, ED     Status: None   Collection Time: 02/16/24  2:31 PM  Result Value Ref Range   Lactic Acid, Venous 1.9 0.5 - 1.9 mmol/L  MRSA Next Gen by PCR, Nasal     Status: Abnormal   Collection Time: 02/16/24  5:29 PM   Specimen: Nasal Mucosa; Nasal Swab  Result Value Ref Range   MRSA by PCR Next Gen DETECTED (A) NOT DETECTED    Comment: RESULT CALLED TO, READ BACK BY AND VERIFIED WITH: RN SELINDA NOVAK [id number] [phone] FCP (NOTE) The GeneXpert MRSA Assay (FDA approved for NASAL specimens only), is one component of a comprehensive MRSA colonization surveillance program. It is not intended to diagnose MRSA infection nor to guide or monitor treatment for MRSA infections. Test performance is not FDA approved in patients less than 88 years old. Performed at [facility], [address] [place], [place] [address]   Glucose, capillary     Status: Abnormal   Collection Time: 02/16/24  6:54 PM  Result Value Ref Range   Glucose-Capillary 298 (H) 70 - 99 mg/dL    Comment: Glucose reference range applies only to samples taken after fasting for at least 8 hours.  Blood Culture (routine x 2)     Status: None (Preliminary result)   Collection Time: 02/16/24  8:19 PM   Specimen: BLOOD RIGHT HAND  Result Value Ref Range   Specimen Description BLOOD RIGHT HAND    Special Requests      BOTTLES DRAWN AEROBIC ONLY Blood Culture results may not be optimal due to an inadequate volume of blood received in culture bottles   Culture      NO GROWTH < 12 HOURS Performed at [facility], [address] [place], [place] [address]    Report Status PENDING   Renal function panel (daily at 1600)     Status: Abnormal   Collection Time: 02/16/24  8:19 PM  Result Value Ref Range   Sodium 133 (L) 135 - 145 mmol/L   Potassium 6.0 (H) 3.5 - 5.1 mmol/L   Chloride 100 98 - 111 mmol/L   CO2 15 (L) 22 - 32 mmol/L   Glucose, Bld 316 (H) 70 - 99 mg/dL    Comment: Glucose reference range applies only to samples taken after fasting for at least 8 hours.   BUN 71 (H) 6 - 20 mg/dL   Creatinine, Ser 4.82 (H) 0.44 - 1.00 mg/dL   Calcium  8.1 (L) 8.9 - 10.3 mg/dL   Phosphorus 5.1 (H)  2.5 - 4.6 mg/dL   Albumin  2.0 (L) 3.5 - 5.0 g/dL   GFR, Estimated 9 (L) >60 mL/min    Comment: (NOTE) Calculated using the CKD-EPI Creatinine Equation (2021)    Anion gap 18 (H) 5 - 15    Comment: Performed at [facility], [address], [place], [place] [address]  Protime-INR     Status: Abnormal   Collection Time: 02/16/24  8:19 PM  Result Value Ref Range   Prothrombin Time 24.7 (H) 11.4 - 15.2 seconds   INR 2.1 (H) 0.8 - 1.2    Comment: (NOTE) INR goal varies based on device and disease states. Performed at [facility], [address] [place], [place] [address]   Glucose, capillary     Status: Abnormal   Collection Time: 02/16/24  9:06 PM  Result Value Ref Range   Glucose-Capillary 308 (H) 70 - 99 mg/dL    Comment: Glucose reference range applies only to samples taken after fasting for at least 8 hours.  Glucose, capillary     Status: Abnormal   Collection Time: 02/16/24 11:17 PM  Result Value Ref Range   Glucose-Capillary 308 (H) 70 - 99 mg/dL    Comment: Glucose reference range applies only to samples taken after fasting for at least 8 hours.  Glucose, capillary     Status: Abnormal   Collection Time: 02/17/24  6:13 AM  Result Value Ref Range   Glucose-Capillary 175 (H) 70 - 99 mg/dL    Comment: Glucose reference range applies only to samples taken after fasting for at least 8 hours.   Glucose, capillary     Status: Abnormal   Collection Time: 02/17/24  7:44 AM  Result Value Ref Range   Glucose-Capillary 158 (H) 70 - 99 mg/dL    Comment: Glucose reference range applies only to samples taken after fasting for at least 8 hours.    CT ABDOMEN PELVIS W CONTRAST Result Date: 02/16/2024 CLINICAL DATA:  Sacral wound. Diabetes. End-stage renal disease requiring dialysis EXAM: CT ABDOMEN AND PELVIS WITH CONTRAST TECHNIQUE: Multidetector CT imaging of the abdomen and pelvis was performed using the standard protocol following bolus administration of intravenous contrast. RADIATION DOSE REDUCTION: This exam was performed according to the departmental dose-optimization program which includes automated exposure control, adjustment of the mA and/or kV according to patient size and/or use of iterative reconstruction technique. CONTRAST:  75 cc Omnipaque  350 COMPARISON:  01/24/2024 FINDINGS: Lower chest: Atheromatous calcifications, including the coronary arteries and aorta. Mildly enlarged heart. Small to moderate-sized pericardial effusion measuring up to 1.3 cm in thickness. Small bilateral pleural effusions. Mild bilateral lower lobe dependent atelectasis. Hepatobiliary: Cholecystectomy clips. Diffuse intrahepatic periportal edema. Small, low-density mass in the medial segment of the left lobe of the liver, adjacent to the falciform ligament. This measures 1.7 cm in maximum diameter on image number 28/4 and 85 Hounsfield units in density. This was not visible on the precontrast images of the liver dated 01/24/2024. Pancreas: Moderate diffuse pancreatic atrophy. Spleen: Normal in size without focal abnormality. Adrenals/Urinary Tract: Normal-appearing adrenal glands. Decreased enhancement in the anterior half of the lower pole of the left kidney on the initial postcontrast images and persisting on the delayed postcontrast images. Both kidneys are small. Bilateral dilated extrarenal pelves extending  into moderately dilated ureters which progressively taper distally. There is moderate diffuse left ureteral mucosal thickening and enhancement involving the distal left ureter prior to entering the ureterovesical junction. There is also mild-to-moderate bladder wall  thickening with diffuse mucosal enhancement. There is also mild mucosal enhancement involving the renal pelves and remainder of both ureters. Stomach/Bowel: Moderate diffuse mildly low density rectal wall thickening with perirectal soft tissue stranding and presacral soft tissue thickening. The appendix is not well visualized. No secondary signs of appendicitis. Unremarkable stomach and small bowel. Vascular/Lymphatic: Atheromatous arterial calcifications without aneurysm. No enlarged lymph nodes. Reproductive: Status post hysterectomy. No adnexal masses. Other: Mild-to-moderate bilateral subcutaneous edema. Moderately large open sacral wound in contact with the coccyx and distal sacrum. Those portions of the coccyx and distal sacrum demonstrate extensive erosion with only thin remaining portions of the bone in those locations. No soft tissue gas separate from the sacral wound. Musculoskeletal: The bones are unremarkable with the exception of the coccyx and distal sacrum, as described above. IMPRESSION: 1. Moderately large open sacral wound in contact with the coccyx and distal sacrum with extensive erosion of the coccyx and those portions of the distal sacrum, compatible with osteomyelitis. 2. Moderate diffuse rectal wall thickening with perirectal soft tissue stranding and presacral soft tissue thickening, compatible with proctitis, possibly secondary to the nearby changes of osteomyelitis. 3. Left kidney pyelonephritis or focally decreased perfusion, bilateral pyelitis, bilateral ureteritis and cystitis. 4. Small to moderate-sized pericardial effusion. 5. Small bilateral pleural effusions and mild bilateral lower lobe dependent atelectasis. 6. Diffuse  intrahepatic periportal edema. This can be seen with hepatitis and right heart failure. 7. 1.7 cm indeterminate mass in the medial segment of the left lobe of the liver. This could be further evaluated with elective outpatient pre and postcontrast MRI of the abdomen. 8. Mild-to-moderate bilateral subcutaneous edema. 9. Calcific coronary artery and aortic atherosclerosis. Aortic Atherosclerosis (ICD10-I70.0). Electronically Signed   By: Elspeth Bathe M.D.   On: 02/16/2024 19:55   CT ANGIO HEAD NECK W WO CM Result Date: 02/16/2024 CLINICAL DATA:  Initial evaluation for acute neck pain, visual loss. EXAM: CT ANGIOGRAPHY HEAD AND NECK WITH AND WITHOUT CONTRAST TECHNIQUE: Multidetector CT imaging of the head and neck was performed using the standard protocol during bolus administration of intravenous contrast. Multiplanar CT image reconstructions and MIPs were obtained to evaluate the vascular anatomy. Carotid stenosis measurements (when applicable) are obtained utilizing NASCET criteria, using the distal internal carotid diameter as the denominator. RADIATION DOSE REDUCTION: This exam was performed according to the departmental dose-optimization program which includes automated exposure control, adjustment of the mA and/or kV according to patient size and/or use of iterative reconstruction technique. CONTRAST:  75mL OMNIPAQUE  IOHEXOL  350 MG/ML SOLN COMPARISON:  None Available. FINDINGS: CT HEAD FINDINGS Brain: Mild age-related cerebral atrophy. Patchy hypodensity involving the supratentorial cerebral white matter, consistent with chronic small vessel ischemic disease. No acute intracranial hemorrhage. No acute large vessel territory infarct. No mass lesion or midline shift. No hydrocephalus or extra-axial fluid collection. Vascular: No abnormal hyperdense vessel. Calcified atherosclerosis present about the skull base. Skull: Scalp soft tissues within normal limits.  Calvarium intact. Sinuses/Orbits: Globes orbital  soft tissues within normal limits. Small volume layering secretions noted within the sphenoid sinuses. Paranasal sinuses are otherwise largely clear. No mastoid effusion. Other: None. Review of the MIP images confirms the above findings CTA NECK FINDINGS Aortic arch: Visualized aortic arch within normal limits for caliber with standard branch pattern. Aortic atherosclerosis. No significant stenosis about the origin the great vessels. Right carotid system: Right common and internal carotid arteries are patent without dissection. Mild atheromatous change about the right carotid bulb without stenosis. Left carotid system: Left common and internal  carotid arteries are patent without dissection. Mild atheromatous change about the left carotid bulb without stenosis. Vertebral arteries: Codominant. No evidence of dissection, stenosis (50% or greater), or occlusion. Skeleton: No worrisome osseous lesions. Other neck: No other acute finding. Upper chest: Moderate layering bilateral pleural effusions with mild pulmonary interstitial congestion. Review of the MIP images confirms the above findings CTA HEAD FINDINGS Anterior circulation: Mild atheromatous change about the carotid siphons bilaterally without hemodynamically significant stenosis. A1 segments patent bilaterally. Right A1 hypoplastic. Normal anterior communicating complex. Anterior cerebral arteries patent without significant stenosis. No M1 stenosis or occlusion. Distal MCA branches perfused and symmetric. Posterior circulation: Both V4 segments patent without significant stenosis. Both PICA patent. Basilar patent without stenosis. Superior cerebellar and posterior cerebral arteries patent bilaterally. Venous sinuses: Grossly patent allowing for timing the contrast bolus. Anatomic variants: None significant.  No aneurysm. Review of the MIP images confirms the above findings IMPRESSION: CT HEAD: 1. No acute intracranial abnormality. 2. Mild age-related cerebral  atrophy with chronic small vessel ischemic disease. CTA HEAD AND NECK: 1. Negative CTA for large vessel occlusion or other emergent finding. 2. Mild atheromatous change about the carotid bifurcations and carotid siphons without hemodynamically significant stenosis. 3. Moderate layering bilateral pleural effusions with mild pulmonary interstitial congestion/edema. 4.  Aortic Atherosclerosis (ICD10-I70.0). Electronically Signed   By: [name] Hoard M.D.   On: 02/16/2024 19:07   DG Chest Port 1 View Result Date: 02/16/2024 CLINICAL DATA:  Sepsis. EXAM: PORTABLE CHEST 1 VIEW COMPARISON:  January 27, 2024. FINDINGS: The heart size and mediastinal contours are within normal limits. Both lungs are clear. The visualized skeletal structures are unremarkable. IMPRESSION: No active disease. Electronically Signed   By: Lynwood Landy Raddle M.D.   On: 02/16/2024 13:03    Review of Systems  Unable to perform ROS: Acuity of condition   Blood pressure 95/62, pulse (!) 166, temperature 98.8 F (37.1 C), temperature source Oral, resp. rate 12, height 5' (1.524 m), weight 62.3 kg, SpO2 100%. Physical Exam Constitutional:      General: She is not in acute distress.    Appearance: She is well-developed. She is ill-appearing. She is not diaphoretic.  HENT:     Head: Normocephalic and atraumatic.  Eyes:     General: No scleral icterus.       Right eye: No discharge.        Left eye: No discharge.     Conjunctiva/sclera: Conjunctivae normal.  Cardiovascular:     Rate and Rhythm: Normal rate and regular rhythm.  Pulmonary:     Effort: Pulmonary effort is normal. No respiratory distress.  Musculoskeletal:     Cervical back: Normal range of motion.     Comments: LLE BKA dehiscence, wound bed w/slough, lateral tibia plate protruding from wound, no ecchymosis or rash  Nontender  No knee effusion  Knee stable to varus/ valgus and anterior/posterior stress  Skin:    General: Skin is warm and dry.  Neurological:      Mental Status: She is alert.  Psychiatric:        Mood and Affect: Mood normal.        Behavior: Behavior normal.     Assessment/Plan: Left BKA dehiscence -- Wound appears stable except for now protruding hardware. Should be ok to wait for scheduled surgery next Friday. Sacral decub evaluation and management should be through general surgery.    Ozell DOROTHA Ned, PA-C Orthopedic Surgery [phone] 02/17/2024, 10:16 AM

## 2024-02-17 NOTE — Procedures (Signed)
 Central Venous Catheter Insertion Procedure Note  [name] [name]  [id number]  07-Nov-1965  Date:02/17/24  Time:3:09 PM   Provider Performing:Frederica Chrestman A Desha Bitner   Procedure: Insertion of Non-tunneled Central Venous [phone]) with US  guidance ([id number])   Indication(s) Medication administration and Difficult access  Consent Risks of the procedure as well as the alternatives and risks of each were explained to the patient and/or caregiver.  Consent for the procedure was obtained and is signed in the bedside chart  Anesthesia Topical only with 1% lidocaine    Timeout Verified patient identification, verified procedure, site/side was marked, verified correct patient position, special equipment/implants available, medications/allergies/relevant history reviewed, required imaging and test results available.  Sterile Technique Maximal sterile technique including full sterile barrier drape, hand hygiene, sterile gown, sterile gloves, mask, hair covering, sterile ultrasound probe cover (if used).  Procedure Description Area of catheter insertion was cleaned with chlorhexidine  and draped in sterile fashion.  With real-time ultrasound guidance a central venous catheter was placed into the right internal jugular vein. Nonpulsatile blood flow and easy flushing noted in all ports.  The catheter was sutured in place and sterile dressing applied.  Complications/Tolerance None; patient tolerated the procedure well. Chest X-ray is ordered to verify placement for internal jugular or subclavian cannulation.   Chest x-ray is not ordered for femoral cannulation.  EBL Minimal  Specimen(s) None

## 2024-02-17 NOTE — Plan of Care (Signed)
  Problem: Clinical Measurements: Goal: Ability to maintain clinical measurements within normal limits will improve Outcome: Progressing Goal: Will remain free from infection Outcome: Progressing Goal: Diagnostic test results will improve Outcome: Progressing Goal: Respiratory complications will improve Outcome: Progressing Goal: Cardiovascular complication will be avoided Outcome: Progressing   Problem: Nutrition: Goal: Adequate nutrition will be maintained Outcome: Progressing   Problem: Pain Managment: Goal: General experience of comfort will improve and/or be controlled Outcome: Progressing   Problem: Safety: Goal: Ability to remain free from injury will improve Outcome: Progressing

## 2024-02-17 NOTE — Procedures (Signed)
 I was present at this session of CRRT. I have reviewed the session itself and made appropriate changes.  Unable to draw morning labs.  To have central line for blood draws.  Will adjust dialysate pending results.  Vital signs in last 24 hours:  Temp:  [97.7 F (36.5 C)-98.8 F (37.1 C)] 98.8 F (37.1 C) (08/15 0744) Pulse Rate:  [50-171] 166 (08/15 0745) Resp:  [8-19] 12 (08/15 0915) BP: (70-154)/(41-104) 95/62 (08/15 0915) SpO2:  [73 %-100 %] 100 % (08/15 0915) Weight:  [59.9 kg-62.3 kg] 62.3 kg (08/15 0447) Weight change:  Filed Weights   02/16/24 1409 02/16/24 1900 02/17/24 0447  Weight: 59.9 kg 61.3 kg 62.3 kg    Recent Labs  Lab 02/16/24 2019  NA 133*  K 6.0*  CL 100  CO2 15*  GLUCOSE 316*  BUN 71*  CREATININE 5.17*  CALCIUM  8.1*  PHOS 5.1*    Recent Labs  Lab 02/16/24 1228 02/16/24 1305  WBC 11.9*  --   NEUTROABS 7.2  --   HGB 8.1* 10.2*  HCT 26.7* 30.0*  MCV 72.8*  --   PLT 374  --     Scheduled Meds:  [START ON 02/18/2024] calcitRIOL   0.5 mcg Oral Q T,Th,Sa-HD   Chlorhexidine  Gluconate Cloth  6 each Topical Q0600   insulin  aspart  0-9 Units Subcutaneous TID AC & HS   midodrine   10 mg Oral TID WC   midodrine   5 mg Oral Once   mupirocin  ointment   Nasal BID   Continuous Infusions:  sodium chloride  10 mL/hr at 02/17/24 0900   albumin  human Stopped (02/17/24 0700)   amiodarone  30 mg/hr (02/17/24 0900)   anticoagulant sodium citrate      ceFEPime  (MAXIPIME ) IV 2 g (02/17/24 0918)   lactated ringers      norepinephrine  (LEVOPHED ) Adult infusion 4 mcg/min (02/17/24 0900)   PrismaSol  BGK 2/3.5 1,500 mL/hr at 02/17/24 0609   prismasol  BGK 4/2.5 400 mL/hr at 02/17/24 0849   prismasol  BGK 4/2.5 400 mL/hr at 02/17/24 0859   sodium chloride      vancomycin      PRN Meds:.alteplase , anticoagulant sodium citrate , docusate sodium , heparin , heparin , heparin , lidocaine  (PF), lidocaine -prilocaine , mouth rinse, oxyCODONE , pentafluoroprop-tetrafluoroeth, polyethylene  glycol, sodium chloride    [name] DELENA Sellar,  MD 02/17/2024, 9:33 AM

## 2024-02-17 NOTE — Progress Notes (Signed)
 Initial Nutrition Assessment  DOCUMENTATION CODES:  Not applicable  INTERVENTION:  Liberalize diet to 2 gram sodium Consider further liberalization if oral intake remains minimal Assistance with tray set up and feeding as appropriate [employer] 1.4 PO BID, each supplement provides 455 kcal and 20 grams protein. Juven BID to support wound healing Renal MVI with minerals daily  NUTRITION DIAGNOSIS:  Increased nutrient needs related to wound healing as evidenced by estimated needs.  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 PMH significant for ESRD on HD, L BKA, prior strokes, DM2, HTN.  Pt seen by RD team during most recent admission.  At that time micronutrient labs were checked in the setting of chronic wounds. Repletion had been ordered and should be complete given the time frame.   Pt is a limited historian. She endorses feeling unwell today.  She states that she has been eating fair since last admission though mentions that her intake over the last few days has been significantly less than normal.   Observed breakfast tray on bedside table though pt did not want this mean. RN re-ordered a meal with pt's preferences (eggs with cheese). Pt had received ordering with assistance last admission as she is a picky eater and this allowed her to choose foods she enjoys. 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   She was receiving [employer] and Juven during last admission. She is amenable to  continuing these supplements during this admission to support her nutritional intake.   EDW 59 kg Current weight 62.3 kg  Drains/lines: Left internal jugular HD catheter  CRRT- x12 hours +365ml x3 hours  Medications: calcitriol , SSI 0-9 units TID Drips: Sodium chloride  infusion Levo @ 4 mcg/min  Labs: updated labs not resulted Sodium 133 Potassium 6.0 BUN 71 Cr 5.17 Anion gap 18 Ionized calcium  1.04 Phosphorus 5.1 GFR 9 CBG's 158-310 x24 hours  NUTRITION - FOCUSED PHYSICAL EXAM: Flowsheet Row Most Recent Value  Orbital Region No depletion  Upper Arm Region No depletion  Thoracic and Lumbar Region No depletion  Buccal Region No depletion  Temple Region No depletion  Clavicle Bone Region Mild depletion  Clavicle and Acromion Bone Region Mild depletion  Scapular Bone Region Mild depletion  Dorsal Hand Mild depletion  Patellar Region Unable to assess  [L BKA]  Anterior Thigh Region Unable to assess  Posterior Calf Region Unable to assess  Edema (RD Assessment) Mild  [RLE]  Hair Reviewed  Eyes Other (Comment)  [blind]  Mouth Reviewed  Skin Reviewed  Nails Reviewed    Diet Order:   Diet Order             Diet 2 gram sodium Room service appropriate? Yes with Assist; Fluid consistency: Thin; Fluid restriction: 1200 mL Fluid  Diet effective now                   EDUCATION NEEDS:  No education needs have been identified at this time  Skin:  Skin Integrity Issues:: Stage III, Stage IV  Stage III: left knee Stage IV: sacrum  Last BM:  PTA  Height:  Ht Readings from Last 1 Encounters:  02/16/24 5' (1.524 m)    Weight:  Wt Readings from Last 1 Encounters:  02/17/24 62.3 kg   BMI:  Body mass index is 26.82 kg/m.  Estimated Nutritional Needs:   Kcal:  1800-2000  Protein:  105-120  Fluid:  1L + UOP  Royce Maris, RDN, LDN Clinical Nutrition See AMiON for contact information.

## 2024-02-17 NOTE — Plan of Care (Signed)
 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  Ortho to see and provide recommendations.  Appreciate assistance.  Aulani Shipton, DO 02/17/24 10:00 AM

## 2024-02-17 NOTE — Progress Notes (Signed)
 Pt receives op HD [employer], TTS, 1140 chair time. Will continue to assist as needed.   Lavanda Ever Halberg Dialysis Navigator [phone]

## 2024-02-17 NOTE — Progress Notes (Signed)
 PHARMACY NOTE:  ANTIMICROBIAL RENAL DOSAGE ADJUSTMENT  Current antimicrobial regimen includes a mismatch between antimicrobial dosage and estimated renal function.  As per policy approved by the Pharmacy & Therapeutics and Medical Executive Committees, the antimicrobial dosage will be adjusted accordingly.  Current antimicrobial dosage:  Cefepime  2gm IV q12h  Indication: concern for sacral osteo  Renal Function:  Estimated Creatinine Clearance: 29.4 mL/min (A) (by C-G formula based on SCr of 1.74 mg/dL (H)). []      On intermittent HD, scheduled: [x]      On CRRT Effluent rate ~39 ml/kg/hr    Antimicrobial dosage has been changed to:  Cefepime  2gm IV q8h  Thank you for allowing pharmacy to be a part of this patient's care.  Vito Ralph, PharmD, BCPS Please see amion for complete clinical pharmacist phone list 02/17/2024 5:37 PM

## 2024-02-17 NOTE — Progress Notes (Signed)
 eLink Physician-Brief Progress Note Patient Name: [name] [name] DOB: May 12, 1966 MRN: [id number]   Date of Service  02/17/2024  HPI/Events of Note  Notified by nurse that patient continues to go in and out of afib.  When converting to sinus HR will drop to 40's then go back to 60's while sinus.  When goes back to afib HR is 100's.  eICU Interventions  No change in amio drip for now When levo titrated off we will likely stop amiodarone      Intervention Category Intermediate Interventions: Arrhythmia - evaluation and management  CLAUDENE AGENT, P 02/17/2024, 10:38 PM

## 2024-02-17 NOTE — Consult Note (Signed)
 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 Change daily  LALM in place for pressure redistribution and moisture management, if the patient leaves the ICU will need air mattress Consult RD for wound supplementation   please consult ortho for the amputation with exposed hardware, I will add topical care but otherwise this will require surgical revision based on goals of care   similar thoughts on sacrum as well, last admission she had a cleaner wound; general surgery did not feel that debridement was necessary this time she has >50% necrotic tissue, would consider re-consult general surgery as well for this. will add topical debridement for now     Re consult if needed, will not follow at this time. Thanks  Alter Moss [employer], RN,CWOCN, CNS, [employer] [phone]

## 2024-02-17 NOTE — Progress Notes (Addendum)
 NAME:  [name] [name], MRN:  [id number], DOB:  Sep 22, 1965, LOS: 1 ADMISSION DATE:  02/16/2024, CONSULTATION DATE:  02/16/24 REFERRING MD:  EDP, CHIEF COMPLAINT:  Vision issues   History of Present Illness:  57 year old female who comes in with history of ESRD on HD, prior L BKA, prior strokes, T2DM, HTN, and heart murmur for hypotension and vision changes that started 8/13 AM while she was riding back to her facility from her doctor's appointment.  Attempted to attend HD on 8/14, but was hypotensive and had an elevated CBG, therefore was sent to the ED.  Lactic acid elevated to 2.2 with hypotension.  Remained hypotensive s/p fluids and was started on broad-spectrum antibiotics (prior to obtaining blood cultures d/t difficulty) and pressors.  Likely secondary to his sacral wound, recently admitted for sacral osteomyelitis.  CT/CTA head demonstrated no acute intracranial abnormality, atheromatous change about the carotid bifurcations and carotid siphons, without significant stenosis and moderate bilateral pleural effusions with minimal pulmonary interstitial congestion/edema.  CTAP demonstrated moderately large open sacral wound in contact with the coccyx and distal sacrum with extensive erosion of the coccyx, moderate diffuse rectal wall thickening with perirectal soft tissue stranding, and presacral soft tissue thickening suggesting proctitis and osteomyelitis.  Small to moderate-sized pericardial effusion and small bilateral pleural effusions.  Diffuse intrahepatic periportal edema.  1.7 cm indeterminate mass noted in the medial 7 mm of the left lobe of the liver which needs further evaluation with pre and postcontrast MRI of the abdomen.  Nephrology was consulted and recommended CRRT, given K of 6.  Was found to be going in and out of A-fib with a stable blood pressure, correlated with EKG changes, likely new onset in the setting of shock.  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  MAPs > 65.  Saturating well on room air.  Tolerating CRRT.  Objective    Blood pressure 95/62, pulse (!) 166, temperature 98.8 F (37.1 C), temperature source Oral, resp. rate 12, height 5' (1.524 m), weight 62.3 kg, SpO2 100%.        Intake/Output Summary (Last 24 hours) at 02/17/2024 0957 Last data filed at 02/17/2024 0900 Gross per 24 hour  Intake 3189.07 ml  Output 1763.2 ml  Net 1425.87 ml   Filed Weights   02/16/24 1409 02/16/24 1900 02/17/24 0447  Weight: 59.9 kg 61.3 kg 62.3 kg   Examination: General: Awake and Alert in NAD HEENT: NCAT. EOMI. Sclera anicteric. No rhinorrhea. Cardiovascular: RRR. Systolic murmur Respiratory: CTAB, normal WOB on RA. No wheezing, crackles, rhonchi, or diminished breath sounds. Abdomen: Soft, non-tender, non-distended. Bowel sounds normoactive Extremities: Able to move all extremities. No BLE edema.  L BKA with slough and hardware, see below Skin: Warm and dry. Stage IV pressure ulcer. Neuro: A&Ox3. No focal neurological deficits.    Resolved problem list   Assessment and Plan  Shock 2/2 sacral osteomyelitis and proctitis POA  Stage IV Sacral Ulcer Remains on Levophed , hypotension has improved but waxes and wanes. MAPs stable.  MRSA nares positive. Echo demonstrates mild intracavitary  gradient, LVEF 60 to 65%, no RWMA, G1DD - Continue levo and increase Midodrine  10 mg TID and maintain MAPs > 65 - Continue cefepime  and vancomycin  (8/14 - ) - Follow blood cultures, likely inaccurate due to having  received antibiotics prior to collection - Wound consulted, appreciate assistance and recommendations - Ortho consulted and will see to reevaluate sacrum due to concern for osteomyelitis and L knee BKA revision since she has been scheduled for revision by Dr. Harden on 8/22  Isolated A-fib event in the setting of shock A-fib resolved within 3 hours of being on amiodarone . - Continue on amiodarone  at 30 mg/h, will likely discontinue if improves hemodynamically - Cardiac telemetry - Unsure if DVT was provoked versus unprovoked, will investigate and restart Eliquis  as indicated  ESRD on HD  Hyperkalemia - CRRT per nephrology - Albumin  25 g every 6 hours - Nephrology on board, appreciate recommendations - Difficult to reevaluate potassium this morning due to inability to drop labs.  Discussed central line with the patient, however due to slow processing will need to rediscuss later today.  EKG demonstrated hyperkalemia with peaked T waves.  T2DM Glucoses elevated upon admission, downtrending this a.m.  A1c 7.1. - Goal 140-180 - Sensitive SSI  Severe deconditioning  Bedbound  L BKA - Continue home gabapentin  100 mg BID - Oxycodone  5 mg every 4 hours as needed for severe pain  Acute on chronic vision issues HTN - Holding home medications of losartan , metoprolol  tartrate, and amlodipine  in the setting of shock.  Best Practice (right click and Reselect all SmartList Selections daily)   Diet/type: Renal diet w/ fluid restriction DVT prophylaxis prophylactic heparin   Pressure ulcer(s): present on admission  GI prophylaxis: N/A Lines: Dialysis Catheter Foley:  N/A Code Status:  full code Last date of multidisciplinary goals of care discussion [pending]  Labs   CBC: Recent Labs  Lab 02/16/24 1228 02/16/24 1305  WBC 11.9*  --   NEUTROABS 7.2  --   HGB 8.1* 10.2*  HCT 26.7* 30.0*  MCV 72.8*  --   PLT 374  --     Basic Metabolic Panel: Recent Labs  Lab 02/16/24 1228  02/16/24 1305 02/16/24 2019  NA 132* 133* 133*  K 6.1* 7.0* 6.0*  CL 99  --  100  CO2 20*  --  15*  GLUCOSE 354*  --  316*  BUN 73*  --  71*  CREATININE 5.45*  --  5.17*  CALCIUM  7.9*  --  8.1*  PHOS  --   --  5.1*   GFR: Estimated Creatinine Clearance: 9.9 mL/min (A) (by C-G formula based on SCr of 5.17 mg/dL (H)). Recent Labs  Lab 02/16/24 1228 02/16/24 1306 02/16/24 1431  WBC 11.9*  --   --   LATICACIDVEN  --  2.2* 1.9    Liver Function Tests: Recent Labs  Lab 02/16/24 1228 02/16/24 2019  AST 33  --   ALT 19  --   ALKPHOS 155*  --   BILITOT 0.6  --   PROT 7.2  --   ALBUMIN  2.0* 2.0*   No results for input(s): LIPASE, AMYLASE in the last 168 hours. No results for input(s): AMMONIA in the last 168 hours.  ABG    Component Value Date/Time   PHART 7.429 (H) 11/05/2009 1457   PCO2ART 35.8 11/05/2009 1457   PO2ART 67.0 (L) 11/05/2009 1457   HCO3 24.0 02/16/2024 1305   TCO2 25 02/16/2024 1305   ACIDBASEDEF 2.0 02/16/2024 1305  O2SAT 43 02/16/2024 1305     Coagulation Profile: Recent Labs  Lab 02/16/24 2019  INR 2.1*    Cardiac Enzymes: No results for input(s): CKTOTAL, CKMB, CKMBINDEX, TROPONINI in the last 168 hours.  HbA1C: Hgb A1c MFr Bld  Date/Time Value Ref Range Status  11/22/2023 12:49 AM 7.1 (H) 4.8 - 5.6 % Final    Comment:    (NOTE) Pre diabetes:          5.7%-6.4%  Diabetes:              >6.4%  Glycemic control for   <7.0% adults with diabetes   04/11/2021 11:17 AM 6.1 (H) 4.8 - 5.6 % Final    Comment:    (NOTE) Pre diabetes:          5.7%-6.4%  Diabetes:              >6.4%  Glycemic control for   <7.0% adults with diabetes     CBG: Recent Labs  Lab 02/16/24 1854 02/16/24 2106 02/16/24 2317 02/17/24 0613 02/17/24 0744  GLUCAP 298* 308* 308* 175* 158*    Review of Systems:   Complains of low back/sacral pain Denies CP, SOB  Past Medical History:  She,  has a past medical history of Colitis,  Diabetes mellitus, ESRD (end stage renal disease) (HCC), Family history of adverse reaction to anesthesia, Fracture of femoral neck, right (HCC) (10/02/2020), GERD (gastroesophageal reflux disease), Heart murmur, Hypertension, Impaired vision in both eyes (11/19/2015), Mitral regurgitation, Neuropathy in diabetes (HCC), and Syncope and collapse (07/28/2015).   Surgical History:   Past Surgical History:  Procedure Laterality Date   ABDOMINAL HYSTERECTOMY  1999   for fibroids. she thinks one ovary was left   AMPUTATION Left 11/25/2023   Procedure: AMPUTATION BELOW KNEE LEFT;  Surgeon: Harden Jerona GAILS, MD;  Location: [facility] OR;  Service: Orthopedics;  Laterality: Left;   CATARACT EXTRACTION     CESAREAN SECTION     CHOLECYSTECTOMY     CONTINUOUS RENAL REPLACEMENT THERAPY  02/17/2024   EYE SURGERY Left retinal   GANGLION CYST EXCISION     HIP PINNING,CANNULATED Right 10/02/2020   Procedure: CANNULATED HIP PINNING;  Surgeon: Celena Sharper, MD;  Location: [facility] OR;  Service: Orthopedics;  Laterality: Right;   INCISION AND DRAINAGE ABSCESS N/A 04/10/2021   Procedure: INCISION AND DRAINAGE ABSCESS LABIA;  Surgeon: Curvin Deward MOULD, MD;  Location: [facility] OR;  Service: General;  Laterality: N/A;   IR FLUORO GUIDE CV LINE LEFT  10/13/2020   IR PTA VENOUS EXCEPT DIALYSIS CIRCUIT  10/13/2020   ORIF TIBIA PLATEAU Left 10/09/2020   Procedure: OPEN REDUCTION INTERNAL FIXATION (ORIF) TIBIAL PLATEAU;  Surgeon: Celena Sharper, MD;  Location: [facility] OR;  Service: Orthopedics;  Laterality: Left;   TONSILLECTOMY     TUBAL LIGATION       Social History:   reports that she has never smoked. She has never used smokeless tobacco. She reports that she does not drink alcohol and does not use drugs.   Family History:  Her family history includes Aneurysm in her mother; Diabetes in her father; Heart attack in her father.   Allergies Allergies  Allergen Reactions   Amitriptyline  Other (See Comments), Swelling and Nausea And  Vomiting    Hypotension, Mental Status Changes, Confusion - Allergic, per MAR   Ciprofloxacin  Diarrhea, Nausea And Vomiting and Other (See Comments)    Severe vomiting requiring ED visit, IV reglan  and IV fluids- Allergic, per [facility]   Ibuprofen   Itching   Aspirin  Itching and Other (See Comments)    Pt endorses getting itchy in the past with aspirin . Tolerated dose 04/06/22.   Na Ferric Gluc Cplx In Sucrose Other (See Comments)   Acetaminophen  Itching, Other (See Comments) and Nausea And Vomiting    Allergic, per MAR   Semaglutide Nausea Only and Other (See Comments)    Allergic, per MAR   Tramadol  Itching     Home Medications  Prior to Admission medications   Medication Sig Start Date End Date Taking? Authorizing Provider  Amino Acids-Protein Hydrolys (PRO-STAT) LIQD Take 30 mLs by mouth 2 (two) times daily.   Yes [provider]  amLODipine  (NORVASC ) 5 MG tablet Take 1 tablet (5 mg total) by mouth every evening. 12/06/23  Yes Regalado, Belkys A, MD  amoxicillin -clavulanate (AUGMENTIN ) 500-125 MG tablet Take 1 tablet by mouth 3 (three) times daily. 02/15/24  Yes Valdemar Longs R, NP  ascorbic acid  (VITAMIN C ) 1000 MG tablet Take 1 tablet (1,000 mg total) by mouth daily. 12/06/23  Yes Regalado, Belkys A, MD  atorvastatin  (LIPITOR) 20 MG tablet Take 1 tablet (20 mg total) by mouth daily. 12/06/23  Yes Regalado, Belkys A, MD  calcium  carbonate (TUMS) 500 MG chewable tablet Chew 1 tablet (200 mg of elemental calcium  total) by mouth 3 (three) times daily with meals. 12/06/23  Yes Regalado, Belkys A, MD  cloNIDine  (CATAPRES  - DOSED IN MG/24 HR) 0.3 mg/24hr patch Place 0.3 mg onto the skin every Wednesday.   Yes [provider]  doxycycline  (VIBRA -TABS) 100 MG tablet Take 1 tablet (100 mg total) by mouth 2 (two) times daily. 02/15/24  Yes Valdemar Longs R, NP  ELIQUIS  5 MG TABS tablet Take 5 mg by mouth 2 (two) times daily. 11/21/23  Yes [provider]  ferrous sulfate 325 (65  FE) MG EC tablet Take 325 mg by mouth in the morning and at bedtime.   Yes [provider]  fluticasone (FLONASE) 50 MCG/ACT nasal spray Place 2 sprays into both nostrils in the morning and at bedtime.   Yes [provider]  gabapentin  (NEURONTIN ) 100 MG capsule Take 1 capsule (100 mg total) by mouth 3 (three) times daily. For 1 week take 1 capsule by mouth at bedtime, following week twice daily, following week 3 times daily Patient taking differently: Take 100 mg by mouth 2 (two) times daily. 01/04/24  Yes Valdemar Longs R, NP  hydrALAZINE  (APRESOLINE ) 25 MG tablet Take 25 mg by mouth See admin instructions. Only if SBP more than 150   Yes [provider]  hydrOXYzine  (ATARAX ) 10 MG tablet Take 10 mg by mouth every 8 (eight) hours as needed for anxiety. 01/13/24  Yes [provider]  isosorbide  mononitrate (IMDUR ) 30 MG 24 hr tablet Take 30 mg by mouth See admin instructions. Take 30 mg by mouth at 8 AM on Sun/Mon/Wed/Fri 11/16/23  Yes [provider]  losartan  (COZAAR ) 50 MG tablet Take 1 tablet (50 mg total) by mouth daily. 02/04/24 03/05/24 Yes Leotis Bogus, MD  metoprolol  tartrate (LOPRESSOR ) 50 MG tablet Take 1 tablet (50 mg total) by mouth 2 (two) times daily. 12/06/23  Yes Regalado, Belkys A, MD  mirtazapine (REMERON) 7.5 MG tablet Take 7.5 mg by mouth at bedtime.   Yes [provider]  multivitamin (RENA-VIT) TABS tablet Take 1 tablet by mouth daily at 8 pm.   Yes [provider]  Nutritional Supplements (NEPRO) LIQD Take 237 mLs by mouth daily.   Yes  [provider]  omeprazole  (PRILOSEC) 40 MG capsule Take 40 mg by mouth in the morning and at bedtime.   Yes [provider]  ondansetron  (ZOFRAN ) 4 MG tablet Take 1 tablet (4 mg total) by mouth every 8 (eight) hours as needed for nausea or vomiting. 05/22/22  Yes Rai, Ripudeep K, MD  oxyCODONE  (OXY IR/ROXICODONE ) 5 MG immediate release tablet Take 5 mg by mouth every 4  (four) hours as needed for severe pain (pain score 7-10).   Yes [provider]  saccharomyces boulardii (FLORASTOR) 250 MG capsule Take 1 capsule (250 mg total) by mouth 2 (two) times daily. 02/03/24 03/04/24 Yes Leotis Bogus, MD  thiamine  (VITAMIN B-1) 100 MG tablet Take 1 tablet (100 mg total) by mouth daily. 12/06/23  Yes Regalado, Belkys A, MD  vitamin A  3 MG (10000 UNITS) capsule Take 1 capsule (10,000 Units total) by mouth daily. 02/03/24 03/04/24 Yes Leotis Bogus, MD  vitamin E  45 MG (100 UNITS) capsule Take 1 capsule (100 Units total) by mouth daily. 02/03/24 03/04/24 Yes Leotis Bogus, MD  zinc  sulfate, 50mg  elemental zinc , 220 (50 Zn) MG capsule Take 1 capsule (220 mg total) by mouth daily. 12/06/23  Yes Regalado, Belkys A, MD  calcitRIOL  (ROCALTROL ) 0.25 MCG capsule Take 1 capsule (0.25 mcg total) by mouth Every Tuesday,Thursday,and Saturday with dialysis. Patient not taking: Reported on 02/16/2024 12/06/23   Madelyne Owen LABOR, MD     Kathrine Melena, DO 02/17/24 9:58 AM

## 2024-02-18 ENCOUNTER — Inpatient Hospital Stay (HOSPITAL_COMMUNITY)

## 2024-02-18 DIAGNOSIS — N186 End stage renal disease: Secondary | ICD-10-CM | POA: Diagnosis not present

## 2024-02-18 DIAGNOSIS — D631 Anemia in chronic kidney disease: Secondary | ICD-10-CM

## 2024-02-18 DIAGNOSIS — Z992 Dependence on renal dialysis: Secondary | ICD-10-CM | POA: Diagnosis not present

## 2024-02-18 DIAGNOSIS — I480 Paroxysmal atrial fibrillation: Secondary | ICD-10-CM

## 2024-02-18 DIAGNOSIS — I9589 Other hypotension: Secondary | ICD-10-CM

## 2024-02-18 DIAGNOSIS — R579 Shock, unspecified: Secondary | ICD-10-CM | POA: Diagnosis not present

## 2024-02-18 LAB — CG4 I-STAT (LACTIC ACID): Lactic Acid, Venous: 9.8 mmol/L (ref 0.5–1.9)

## 2024-02-18 LAB — RENAL FUNCTION PANEL
Albumin: 3 g/dL — ABNORMAL LOW (ref 3.5–5.0)
Albumin: 2.6 g/dL — ABNORMAL LOW (ref 3.5–5.0)
Anion gap: 13 (ref 5–15)
Anion gap: 19 — ABNORMAL HIGH (ref 5–15)
BUN: 13 mg/dL (ref 6–20)
BUN: 11 mg/dL (ref 6–20)
CO2: 20 mmol/L — ABNORMAL LOW (ref 22–32)
CO2: 14 mmol/L — ABNORMAL LOW (ref 22–32)
Calcium: 8.4 mg/dL — ABNORMAL LOW (ref 8.9–10.3)
Calcium: 8.3 mg/dL — ABNORMAL LOW (ref 8.9–10.3)
Chloride: 99 mmol/L (ref 98–111)
Chloride: 101 mmol/L (ref 98–111)
Creatinine, Ser: 1.16 mg/dL — ABNORMAL HIGH (ref 0.44–1.00)
Creatinine, Ser: 1.1 mg/dL — ABNORMAL HIGH (ref 0.44–1.00)
GFR, Estimated: 55 mL/min — ABNORMAL LOW (ref >60)
GFR, Estimated: 59 mL/min — ABNORMAL LOW (ref >60)
Glucose, Bld: 181 mg/dL — ABNORMAL HIGH (ref 70–99)
Glucose, Bld: 87 mg/dL (ref 70–99)
Phosphorus: 3.1 mg/dL (ref 2.5–4.6)
Phosphorus: 2.6 mg/dL (ref 2.5–4.6)
Potassium: 4 mmol/L (ref 3.5–5.1)
Potassium: 3.5 mmol/L (ref 3.5–5.1)
Sodium: 132 mmol/L — ABNORMAL LOW (ref 135–145)
Sodium: 134 mmol/L — ABNORMAL LOW (ref 135–145)

## 2024-02-18 LAB — TYPE AND SCREEN
ABO/RH(D): O POS
Antibody Screen: NEGATIVE
Unit division: 0

## 2024-02-18 LAB — POCT I-STAT 7, (LYTES, BLD GAS, ICA,H+H)
Acid-base deficit: 13 mmol/L — ABNORMAL HIGH (ref 0.0–2.0)
Bicarbonate: 11.7 mmol/L — ABNORMAL LOW (ref 20.0–28.0)
Calcium, Ion: 1.11 mmol/L — ABNORMAL LOW (ref 1.15–1.40)
HCT: 29 % — ABNORMAL LOW (ref 36.0–46.0)
Hemoglobin: 9.9 g/dL — ABNORMAL LOW (ref 12.0–15.0)
O2 Saturation: 97 %
Patient temperature: 97.8
Potassium: 3.6 mmol/L (ref 3.5–5.1)
Sodium: 132 mmol/L — ABNORMAL LOW (ref 135–145)
TCO2: 12 mmol/L — ABNORMAL LOW (ref 22–32)
pCO2 arterial: 24.2 mmHg — ABNORMAL LOW (ref 32–48)
pH, Arterial: 7.291 — ABNORMAL LOW (ref 7.35–7.45)
pO2, Arterial: 92 mmHg (ref 83–108)

## 2024-02-18 LAB — CBC
HCT: 27.2 % — ABNORMAL LOW (ref 36.0–46.0)
Hemoglobin: 8.6 g/dL — ABNORMAL LOW (ref 12.0–15.0)
MCH: 23.9 pg — ABNORMAL LOW (ref 26.0–34.0)
MCHC: 31.6 g/dL (ref 30.0–36.0)
MCV: 75.6 fL — ABNORMAL LOW (ref 80.0–100.0)
Platelets: 274 K/uL (ref 150–400)
RBC: 3.6 MIL/uL — ABNORMAL LOW (ref 3.87–5.11)
RDW: 22.5 % — ABNORMAL HIGH (ref 11.5–15.5)
WBC: 21.9 K/uL — ABNORMAL HIGH (ref 4.0–10.5)
nRBC: 0 % (ref 0.0–0.2)

## 2024-02-18 LAB — BASIC METABOLIC PANEL WITH GFR
Anion gap: 14 (ref 5–15)
Anion gap: 25 — ABNORMAL HIGH (ref 5–15)
BUN: 13 mg/dL (ref 6–20)
BUN: 9 mg/dL (ref 6–20)
CO2: 20 mmol/L — ABNORMAL LOW (ref 22–32)
CO2: 10 mmol/L — ABNORMAL LOW (ref 22–32)
Calcium: 8.4 mg/dL — ABNORMAL LOW (ref 8.9–10.3)
Calcium: 8.5 mg/dL — ABNORMAL LOW (ref 8.9–10.3)
Chloride: 99 mmol/L (ref 98–111)
Chloride: 100 mmol/L (ref 98–111)
Creatinine, Ser: 1.2 mg/dL — ABNORMAL HIGH (ref 0.44–1.00)
Creatinine, Ser: 0.94 mg/dL (ref 0.44–1.00)
GFR, Estimated: 53 mL/min — ABNORMAL LOW (ref >60)
GFR, Estimated: >60 mL/min (ref >60)
Glucose, Bld: 179 mg/dL — ABNORMAL HIGH (ref 70–99)
Glucose, Bld: 92 mg/dL (ref 70–99)
Potassium: 4.2 mmol/L (ref 3.5–5.1)
Potassium: 3.8 mmol/L (ref 3.5–5.1)
Sodium: 133 mmol/L — ABNORMAL LOW (ref 135–145)
Sodium: 135 mmol/L (ref 135–145)

## 2024-02-18 LAB — GLUCOSE, CAPILLARY
Glucose-Capillary: 151 mg/dL — ABNORMAL HIGH (ref 70–99)
Glucose-Capillary: 123 mg/dL — ABNORMAL HIGH (ref 70–99)
Glucose-Capillary: 76 mg/dL (ref 70–99)
Glucose-Capillary: 65 mg/dL — ABNORMAL LOW (ref 70–99)
Glucose-Capillary: 239 mg/dL — ABNORMAL HIGH (ref 70–99)
Glucose-Capillary: 100 mg/dL — ABNORMAL HIGH (ref 70–99)
Glucose-Capillary: 61 mg/dL — ABNORMAL LOW (ref 70–99)
Glucose-Capillary: 94 mg/dL (ref 70–99)

## 2024-02-18 LAB — ECHOCARDIOGRAM LIMITED BUBBLE STUDY
Area-P 1/2: 2.95 cm2
S' Lateral: 1.58 cm

## 2024-02-18 LAB — BLOOD GAS, ARTERIAL
Acid-base deficit: 13.9 mmol/L — ABNORMAL HIGH (ref 0.0–2.0)
Bicarbonate: 10.6 mmol/L — ABNORMAL LOW (ref 20.0–28.0)
Drawn by: 38235
O2 Saturation: 99 %
Patient temperature: 35.8
pCO2 arterial: 21 mmHg — ABNORMAL LOW (ref 32–48)
pH, Arterial: 7.31 — ABNORMAL LOW (ref 7.35–7.45)
pO2, Arterial: 150 mmHg — ABNORMAL HIGH (ref 83–108)

## 2024-02-18 LAB — BPAM RBC
Blood Product Expiration Date: 202508222359
ISSUE DATE / TIME: 202508151742
Unit Type and Rh: 5100

## 2024-02-18 LAB — MAGNESIUM
Magnesium: 1.8 mg/dL (ref 1.7–2.4)
Magnesium: 2.2 mg/dL (ref 1.7–2.4)

## 2024-02-18 LAB — TROPONIN I (HIGH SENSITIVITY): Troponin I (High Sensitivity): 31 ng/L — ABNORMAL HIGH (ref <18)

## 2024-02-18 LAB — PHOSPHORUS: Phosphorus: 3.2 mg/dL (ref 2.5–4.6)

## 2024-02-18 LAB — LACTIC ACID, PLASMA: Lactic Acid, Venous: >9 mmol/L (ref 0.5–1.9)

## 2024-02-18 MED ORDER — ORAL CARE MOUTH RINSE: 15.0000 mL | OROMUCOSAL | Status: DC | PRN

## 2024-02-18 MED ORDER — LACTATED RINGERS IV BOLUS
500.0000 mL | Freq: Once | INTRAVENOUS | Status: AC
  Administered 2024-02-18: 500 mL via INTRAVENOUS

## 2024-02-18 MED ORDER — EPINEPHRINE HCL 5 MG/250ML IV SOLN IN NS
0.5000 ug/min | INTRAVENOUS | Status: DC
  Administered 2024-02-18: 0.5 ug/min via INTRAVENOUS
  Filled 2024-02-18 (×2): qty 250

## 2024-02-18 MED ORDER — SODIUM BICARBONATE 8.4 % IV SOLN
INTRAVENOUS | Status: DC
  Filled 2024-02-18 (×2): qty 1000

## 2024-02-18 MED ORDER — NOREPINEPHRINE 4 MG/250ML-% IV SOLN: 0.0000 ug/min | INTRAVENOUS | Status: DC

## 2024-02-18 MED ORDER — POLYETHYLENE GLYCOL 3350 17 G PO PACK
17.0000 g | PACK | Freq: Every day | ORAL | Status: DC
  Administered 2024-02-18: 17 g
  Filled 2024-02-18: qty 1

## 2024-02-18 MED ORDER — FENTANYL CITRATE PF 50 MCG/ML IJ SOSY
PREFILLED_SYRINGE | INTRAMUSCULAR | Status: AC
Start: 2024-02-18 — End: 2024-02-19
  Filled 2024-02-18: qty 2

## 2024-02-18 MED ORDER — KATE FARMS STANDARD 1.4 PO LIQD
325.0000 mL | Freq: Two times a day (BID) | ORAL | Status: DC
  Filled 2024-02-18 (×2): qty 325

## 2024-02-18 MED ORDER — MAGNESIUM SULFATE 2 GM/50ML IV SOLN
2.0000 g | Freq: Once | INTRAVENOUS | Status: AC
  Administered 2024-02-18: 2 g via INTRAVENOUS
  Filled 2024-02-18: qty 50

## 2024-02-18 MED ORDER — ROCURONIUM BROMIDE 10 MG/ML (PF) SYRINGE
PREFILLED_SYRINGE | INTRAVENOUS | Status: AC
  Administered 2024-02-18: 80 mg via INTRAVENOUS
  Filled 2024-02-18: qty 10

## 2024-02-18 MED ORDER — NOREPINEPHRINE 16 MG/250ML-% IV SOLN
0.0000 ug/min | INTRAVENOUS | Status: DC
  Administered 2024-02-18 – 2024-02-19 (×2): 20 ug/min via INTRAVENOUS
  Filled 2024-02-18 (×2): qty 250

## 2024-02-18 MED ORDER — HYDROCORTISONE SOD SUC (PF) 100 MG IJ SOLR
100.0000 mg | Freq: Two times a day (BID) | INTRAMUSCULAR | Status: DC
  Administered 2024-02-18 – 2024-02-19 (×2): 100 mg via INTRAVENOUS
  Filled 2024-02-18 (×2): qty 2

## 2024-02-18 MED ORDER — DOCUSATE SODIUM 50 MG/5ML PO LIQD
100.0000 mg | Freq: Two times a day (BID) | ORAL | Status: DC
  Administered 2024-02-18: 100 mg
  Filled 2024-02-18: qty 10

## 2024-02-18 MED ORDER — POTASSIUM & SODIUM PHOSPHATES 280-160-250 MG PO PACK
2.0000 | PACK | ORAL | Status: DC
  Administered 2024-02-18 – 2024-02-19 (×2): 2
  Filled 2024-02-18 (×2): qty 2

## 2024-02-18 MED ORDER — POTASSIUM & SODIUM PHOSPHATES 280-160-250 MG PO PACK: 2.0000 | PACK | ORAL | Status: DC

## 2024-02-18 MED ORDER — ONDANSETRON HCL 4 MG/2ML IJ SOLN
4.0000 mg | Freq: Three times a day (TID) | INTRAMUSCULAR | Status: DC | PRN
  Administered 2024-02-18: 4 mg via INTRAVENOUS
  Filled 2024-02-18: qty 2

## 2024-02-18 MED ORDER — MIDAZOLAM HCL 2 MG/2ML IJ SOLN
INTRAMUSCULAR | Status: AC
  Administered 2024-02-18: 2 mg via INTRAVENOUS
  Filled 2024-02-18: qty 2

## 2024-02-18 MED ORDER — DEXTROSE-SODIUM CHLORIDE 5-0.9 % IV SOLN: INTRAVENOUS | Status: DC

## 2024-02-18 MED ORDER — ETOMIDATE 2 MG/ML IV SOLN: 20.0000 mg | Freq: Once | INTRAVENOUS | Status: AC

## 2024-02-18 MED ORDER — POLYETHYLENE GLYCOL 3350 17 G PO PACK: 17.0000 g | PACK | Freq: Every day | ORAL | Status: DC | PRN

## 2024-02-18 MED ORDER — FENTANYL CITRATE PF 50 MCG/ML IJ SOSY: 25.0000 ug | PREFILLED_SYRINGE | Freq: Once | INTRAMUSCULAR | Status: DC

## 2024-02-18 MED ORDER — VASOPRESSIN 20 UNITS/100 ML INFUSION FOR SHOCK
0.0000 [IU]/min | INTRAVENOUS | Status: DC
  Administered 2024-02-18 – 2024-02-19 (×2): 0.04 [IU]/min via INTRAVENOUS
  Filled 2024-02-18 (×2): qty 100

## 2024-02-18 MED ORDER — MIDAZOLAM HCL 2 MG/2ML IJ SOLN: 2.0000 mg | Freq: Once | INTRAMUSCULAR | Status: AC

## 2024-02-18 MED ORDER — ORAL CARE MOUTH RINSE: 15.0000 mL | OROMUCOSAL | Status: DC

## 2024-02-18 MED ORDER — ORAL CARE MOUTH RINSE
15.0000 mL | OROMUCOSAL | Status: DC | PRN
Start: 2024-02-18 — End: 2024-02-19

## 2024-02-18 MED ORDER — RENA-VITE PO TABS
1.0000 | ORAL_TABLET | Freq: Every day | ORAL | Status: DC
  Administered 2024-02-18: 1
  Filled 2024-02-18: qty 1

## 2024-02-18 MED ORDER — FENTANYL BOLUS VIA INFUSION
25.0000 ug | INTRAVENOUS | Status: DC | PRN
  Administered 2024-02-18: 25 ug via INTRAVENOUS
  Administered 2024-02-18: 50 ug via INTRAVENOUS
  Administered 2024-02-19: 25 ug via INTRAVENOUS

## 2024-02-18 MED ORDER — ORAL CARE MOUTH RINSE
15.0000 mL | OROMUCOSAL | Status: DC
  Administered 2024-02-18 – 2024-02-19 (×4): 15 mL via OROMUCOSAL

## 2024-02-18 MED ORDER — ROCURONIUM BROMIDE 10 MG/ML (PF) SYRINGE: 80.0000 mg | PREFILLED_SYRINGE | Freq: Once | INTRAVENOUS | Status: AC

## 2024-02-18 MED ORDER — GABAPENTIN 250 MG/5ML PO SOLN
100.0000 mg | Freq: Two times a day (BID) | ORAL | Status: DC
  Administered 2024-02-18: 100 mg
  Filled 2024-02-18 (×2): qty 2

## 2024-02-18 MED ORDER — DEXTROSE 50 % IV SOLN
12.5000 g | INTRAVENOUS | Status: AC
  Administered 2024-02-18: 12.5 g via INTRAVENOUS
  Filled 2024-02-18: qty 50

## 2024-02-18 MED ORDER — CALCITRIOL 0.5 MCG PO CAPS: 0.5000 ug | ORAL_CAPSULE | ORAL | Status: DC

## 2024-02-18 MED ORDER — FENTANYL 2500MCG IN NS 250ML (10MCG/ML) PREMIX INFUSION
0.0000 ug/h | INTRAVENOUS | Status: DC
  Administered 2024-02-18: 25 ug/h via INTRAVENOUS
  Filled 2024-02-18: qty 250

## 2024-02-18 MED ORDER — NOREPINEPHRINE 16 MG/250ML-% IV SOLN
0.0000 ug/min | INTRAVENOUS | Status: DC
  Administered 2024-02-18: 8 ug/min via INTRAVENOUS
  Filled 2024-02-18: qty 250

## 2024-02-18 MED ORDER — JUVEN PO PACK: 1.0000 | PACK | Freq: Two times a day (BID) | ORAL | Status: DC

## 2024-02-18 MED ORDER — PROPOFOL 1000 MG/100ML IV EMUL
0.0000 ug/kg/min | INTRAVENOUS | Status: DC
  Filled 2024-02-18: qty 100

## 2024-02-18 MED ORDER — ETOMIDATE 2 MG/ML IV SOLN
INTRAVENOUS | Status: AC
  Administered 2024-02-18: 20 mg via INTRAVENOUS
  Filled 2024-02-18: qty 20

## 2024-02-18 MED ORDER — DARBEPOETIN ALFA 100 MCG/0.5ML IJ SOSY: 100.0000 ug | PREFILLED_SYRINGE | INTRAMUSCULAR | Status: DC

## 2024-02-18 MED ORDER — DOCUSATE SODIUM 50 MG/5ML PO LIQD: 100.0000 mg | Freq: Two times a day (BID) | ORAL | Status: DC | PRN

## 2024-02-18 MED ORDER — VASOPRESSIN 20 UNITS/100 ML INFUSION FOR SHOCK
INTRAVENOUS | Status: AC
  Administered 2024-02-18: 0.03 [IU]/min via INTRAVENOUS
  Filled 2024-02-18: qty 100

## 2024-02-18 NOTE — Progress Notes (Addendum)
 eLink Physician-Brief Progress Note Patient Name: [name] [name] DOB: Nov 18, 1965 MRN: [id number]   Date of Service  02/18/2024  HPI/Events of Note  ABG noted. RN asked to change vaso to 0.04 dose. Is on levophed  at 18 mic and SBP 98. O2 sat 97. Also has epinephrine  ordered if needed  eICU Interventions  Recheck ABG in AM. Is on CRRT and vent Vaso changed to 0.04 mic dose     Intervention Category Major Interventions: Respiratory failure - evaluation and management;Shock - evaluation and management  [name] [name] 02/18/2024, 7:47 PM  11;10 pm - LA > 9. Was 9.6 earlier as well. Had increased pressor needs where levo was up to 36 mic, epi had to be started and vaso was changed to 0.04. Epi is at 1.5 mic right now, levo down to 22 mic/min.Is on CRRT and a bicarb drip as well. Not much else to add at this time. Hoping pressor needs improve while on antibiotics. Continue as planned. Dw RN   2:10 am - Worsening shock. SBP 80s while on 40 mic of levo, HR in 130s/140s in A fib. Patient also on 4 mic of epi and on vaso. RN had already stopped pulling volume with CRRT. Qtc was 525 on earlier check and remains in A fib. Sometimes in rapid A fib and sometimes develops pauses which is why the amio rate was lowered earlier. I added Phenylephrine  in order to see if BP improves without compromising the HR but I think medically, this is all we can do. I called and spoke with husband [name] [name] [phone] and daughter [name] [name] [phone]. I explained the situation to them. [name] [name] is not sure what should be done at this time and said he would want the daughter also to decide. [name] [name] wanted to come in. I explained that we are doing all that we can and she has a high chance of not making it through the night. They had questions of the time frame of events which I said I could not conclusively answer.   325 am - [name] [name] is at bedside. Requested ground team to speak with family per their request  520 am - Retrospective  entry. RN had notified me that patient was on max doses of 4 pressors with SBP in 70s. I asked them to stop CRRT entirely since she is not tolerating it at all. I had updated husband [name] [name] over the phone earlier and he had also said that he does not want her to suffer and agreed to DNR if the children were also in agreement. CCM NP spoke with family twice upon arrival to hospital and patient now made DNR

## 2024-02-18 NOTE — Procedures (Signed)
 Intubation Procedure Note  [name] [name]  [id number]  07/30/1965  Date:02/18/24  Time:5:33 PM   Provider Performing:Leila Schuff    Procedure: Intubation (31500)  Indication(s) Respiratory Failure  Consent Risks of the procedure as well as the alternatives and risks of each were explained to the patient and/or caregiver.  Consent for the procedure was obtained and is signed in the bedside chart   Anesthesia Etomidate , Versed , and Rocuronium    Time Out Verified patient identification, verified procedure, site/side was marked, verified correct patient position, special equipment/implants available, medications/allergies/relevant history reviewed, required imaging and test results available.   Sterile Technique Usual hand hygeine, masks, and gloves were used   Procedure Description Patient positioned in bed supine.  Sedation given as noted above.  Patient was intubated with endotracheal tube using Glidescope.  View was Grade 1 full glottis .  Number of attempts was 1.  Colorimetric CO2 detector was consistent with tracheal placement.   Complications/Tolerance None; patient tolerated the procedure well. Chest X-ray is ordered to verify placement.   EBL Minimal   Specimen(s) None  Lonna Coder MD [facility] See Amion for pager  If no response to pager , please call [phone] until 7pm After 7:00 pm call Elink  [phone] 02/18/2024, 5:33 PM

## 2024-02-18 NOTE — Progress Notes (Signed)
 Date and time results received: 02/18/24 2305 (use smartphrase .now to insert current time)  Test: Lactic acid Critical Value: >9  Name of Provider Notified: Kamat  Orders Received? Or Actions Taken?: see orders

## 2024-02-18 NOTE — Progress Notes (Signed)
  Echocardiogram 2D Echocardiogram has been performed.  Devora Ellouise SAUNDERS 02/18/2024, 10:03 AM

## 2024-02-18 NOTE — Progress Notes (Addendum)
 PCCM note  Increasing pressor requirements.  Now on Levophed , vasopressin  and stress dose steroids Patient is more lethargic Echocardiogram with moderate pericardial effusion without tamponade, EKG with prolonged QTc, ST-T changes Discussed with cardiology and this looks like pericarditis, likely from uremia and sepsis.  No indication for drainage at this point  Will continue supportive care Get A-line  I was able to get in touch with family who confirmed full code.  Requested that they come in for family meeting else she is getting worse Palliative consulted  Lonna Coder MD [facility] See Amion for pager  If no response to pager , please call [phone] until 7pm After 7:00 pm call Elink  [phone] 02/18/2024, 2:16 PM

## 2024-02-18 NOTE — Progress Notes (Signed)
 Subjective: Patient was seen by our service just a couple of weeks ago for her sacral decubitus ulcer and felt to not warrant any debridement and continue local wound care.  Patient has returned to the hospital on 8/14 due to some hypotension and elevated CBG.  She is currently encephalopathic and lethargic and unable to provide any history.  We have been asked to re-evaluate her wound for debridement.  She does have a CT scan that questions osteomyelitis of her sacrum as well as possible proctocolitis.    ROS: unable due to lethargy  Objective: Vital signs in last 24 hours: Temp:  [97.7 F (36.5 C)-98.9 F (37.2 C)] 98.3 F (36.8 C) (08/16 0748) Pulse Rate:  [42-220] 122 (08/16 0915) Resp:  [5-28] 18 (08/16 0915) BP: (56-157)/(24-112) 81/60 (08/16 0915) SpO2:  [91 %-100 %] 92 % (08/16 0915) Weight:  [56.7 kg] 56.7 kg (08/16 0213) Last BM Date :  (PTA)  Intake/Output from previous day: 08/15 0701 - 08/16 0700 In: 2287.8 [I.V.:882.1; Blood:322; IV Piggyback:1083.8] Out: 3314.7  Intake/Output this shift: Total I/O In: 49.3 [I.V.:49.3] Out: 169   PE: Gen: lethargic and won't respond to questions, opens her eyes to pain with turning Heart: irregular Lungs: respiratory effort nonlabored Skin: stage IV sacral wound with some somewhat tightly adhered fibrin at the base.  There is some looser necrotic eschar at the superior aspect of the wound as well as some on the left side as well.  No erythema or purulent drainage  Psych: lethargic and unable to participate in care  Lab Results:  Recent Labs    02/17/24 2129 02/18/24 0321  WBC 19.4* 21.9*  HGB 8.6* 8.6*  HCT 26.8* 27.2*  PLT 243 274   BMET Recent Labs    02/18/24 0321 02/18/24 0322  NA 133* 132*  K 4.2 4.0  CL 99 99  CO2 20* 20*  GLUCOSE 179* 181*  BUN 13 13  CREATININE 1.20* 1.16*  CALCIUM  8.4* 8.4*   PT/INR Recent Labs    02/16/24 2019  LABPROT 24.7*  INR 2.1*   CMP     Component Value  Date/Time   NA 132 (L) 02/18/2024 0322   K 4.0 02/18/2024 0322   CL 99 02/18/2024 0322   CO2 20 (L) 02/18/2024 0322   GLUCOSE 181 (H) 02/18/2024 0322   BUN 13 02/18/2024 0322   CREATININE 1.16 (H) 02/18/2024 0322   CREATININE 0.68 10/04/2013 1418   CALCIUM  8.4 (L) 02/18/2024 0322   PROT 7.2 02/16/2024 1228   ALBUMIN  3.0 (L) 02/18/2024 0322   AST 33 02/16/2024 1228   ALT 19 02/16/2024 1228   ALKPHOS 155 (H) 02/16/2024 1228   BILITOT 0.6 02/16/2024 1228   GFRNONAA 55 (L) 02/18/2024 0322   GFRNONAA >89 10/04/2013 1418   GFRAA 10 (L) 03/14/2020 0628   GFRAA >89 10/04/2013 1418   Lipase     Component Value Date/Time   LIPASE 27 08/09/2022 1720       Studies/Results: DG CHEST PORT 1 VIEW Result Date: 02/17/2024 CLINICAL DATA:  Central line placement. EXAM: PORTABLE CHEST 1 VIEW COMPARISON:  None Available. FINDINGS: Interval right jugular catheter with its tip in the inferior right atrium, proximally 7 cm inferior to the superior cavoatrial junction. Stable left double-lumen central catheter with its tip in the right atrium approximately 3 cm inferior to the superior cavoatrial junction. No pneumothorax. Stable enlarged cardiac silhouette and mild bibasilar airspace opacity. Stable small amount of aortic arch calcification.  Unremarkable bones. IMPRESSION: 1. Right jugular catheter tip in the inferior right atrium, 7 cm inferior to the superior cavoatrial junction. 2. Stable left double-lumen catheter with its tip in the right atrium, 3 cm inferior to the superior cavoatrial junction. 3. No pneumothorax. 4. Stable mild bibasilar atelectasis. 5. Stable cardiomegaly. Electronically Signed   By: Elspeth Bathe M.D.   On: 02/17/2024 16:22   CT ABDOMEN PELVIS W CONTRAST Result Date: 02/16/2024 CLINICAL DATA:  Sacral wound. Diabetes. End-stage renal disease requiring dialysis EXAM: CT ABDOMEN AND PELVIS WITH CONTRAST TECHNIQUE: Multidetector CT imaging of the abdomen and pelvis was performed  using the standard protocol following bolus administration of intravenous contrast. RADIATION DOSE REDUCTION: This exam was performed according to the departmental dose-optimization program which includes automated exposure control, adjustment of the mA and/or kV according to patient size and/or use of iterative reconstruction technique. CONTRAST:  75 cc Omnipaque  350 COMPARISON:  01/24/2024 FINDINGS: Lower chest: Atheromatous calcifications, including the coronary arteries and aorta. Mildly enlarged heart. Small to moderate-sized pericardial effusion measuring up to 1.3 cm in thickness. Small bilateral pleural effusions. Mild bilateral lower lobe dependent atelectasis. Hepatobiliary: Cholecystectomy clips. Diffuse intrahepatic periportal edema. Small, low-density mass in the medial segment of the left lobe of the liver, adjacent to the falciform ligament. This measures 1.7 cm in maximum diameter on image number 28/4 and 85 Hounsfield units in density. This was not visible on the precontrast images of the liver dated 01/24/2024. Pancreas: Moderate diffuse pancreatic atrophy. Spleen: Normal in size without focal abnormality. Adrenals/Urinary Tract: Normal-appearing adrenal glands. Decreased enhancement in the anterior half of the lower pole of the left kidney on the initial postcontrast images and persisting on the delayed postcontrast images. Both kidneys are small. Bilateral dilated extrarenal pelves extending into moderately dilated ureters which progressively taper distally. There is moderate diffuse left ureteral mucosal thickening and enhancement involving the distal left ureter prior to entering the ureterovesical junction. There is also mild-to-moderate bladder wall thickening with diffuse mucosal enhancement. There is also mild mucosal enhancement involving the renal pelves and remainder of both ureters. Stomach/Bowel: Moderate diffuse mildly low density rectal wall thickening with perirectal soft tissue  stranding and presacral soft tissue thickening. The appendix is not well visualized. No secondary signs of appendicitis. Unremarkable stomach and small bowel. Vascular/Lymphatic: Atheromatous arterial calcifications without aneurysm. No enlarged lymph nodes. Reproductive: Status post hysterectomy. No adnexal masses. Other: Mild-to-moderate bilateral subcutaneous edema. Moderately large open sacral wound in contact with the coccyx and distal sacrum. Those portions of the coccyx and distal sacrum demonstrate extensive erosion with only thin remaining portions of the bone in those locations. No soft tissue gas separate from the sacral wound. Musculoskeletal: The bones are unremarkable with the exception of the coccyx and distal sacrum, as described above. IMPRESSION: 1. Moderately large open sacral wound in contact with the coccyx and distal sacrum with extensive erosion of the coccyx and those portions of the distal sacrum, compatible with osteomyelitis. 2. Moderate diffuse rectal wall thickening with perirectal soft tissue stranding and presacral soft tissue thickening, compatible with proctitis, possibly secondary to the nearby changes of osteomyelitis. 3. Left kidney pyelonephritis or focally decreased perfusion, bilateral pyelitis, bilateral ureteritis and cystitis. 4. Small to moderate-sized pericardial effusion. 5. Small bilateral pleural effusions and mild bilateral lower lobe dependent atelectasis. 6. Diffuse intrahepatic periportal edema. This can be seen with hepatitis and right heart failure. 7. 1.7 cm indeterminate mass in the medial segment of the left lobe of the liver. This could  be further evaluated with elective outpatient pre and postcontrast MRI of the abdomen. 8. Mild-to-moderate bilateral subcutaneous edema. 9. Calcific coronary artery and aortic atherosclerosis. Aortic Atherosclerosis (ICD10-I70.0). Electronically Signed   By: Elspeth Bathe M.D.   On: 02/16/2024 19:55   CT ANGIO HEAD NECK W WO  CM Result Date: 02/16/2024 CLINICAL DATA:  Initial evaluation for acute neck pain, visual loss. EXAM: CT ANGIOGRAPHY HEAD AND NECK WITH AND WITHOUT CONTRAST TECHNIQUE: Multidetector CT imaging of the head and neck was performed using the standard protocol during bolus administration of intravenous contrast. Multiplanar CT image reconstructions and MIPs were obtained to evaluate the vascular anatomy. Carotid stenosis measurements (when applicable) are obtained utilizing NASCET criteria, using the distal internal carotid diameter as the denominator. RADIATION DOSE REDUCTION: This exam was performed according to the departmental dose-optimization program which includes automated exposure control, adjustment of the mA and/or kV according to patient size and/or use of iterative reconstruction technique. CONTRAST:  75mL OMNIPAQUE  IOHEXOL  350 MG/ML SOLN COMPARISON:  None Available. FINDINGS: CT HEAD FINDINGS Brain: Mild age-related cerebral atrophy. Patchy hypodensity involving the supratentorial cerebral white matter, consistent with chronic small vessel ischemic disease. No acute intracranial hemorrhage. No acute large vessel territory infarct. No mass lesion or midline shift. No hydrocephalus or extra-axial fluid collection. Vascular: No abnormal hyperdense vessel. Calcified atherosclerosis present about the skull base. Skull: Scalp soft tissues within normal limits.  Calvarium intact. Sinuses/Orbits: Globes orbital soft tissues within normal limits. Small volume layering secretions noted within the sphenoid sinuses. Paranasal sinuses are otherwise largely clear. No mastoid effusion. Other: None. Review of the MIP images confirms the above findings CTA NECK FINDINGS Aortic arch: Visualized aortic arch within normal limits for caliber with standard branch pattern. Aortic atherosclerosis. No significant stenosis about the origin the great vessels. Right carotid system: Right common and internal carotid arteries are  patent without dissection. Mild atheromatous change about the right carotid bulb without stenosis. Left carotid system: Left common and internal carotid arteries are patent without dissection. Mild atheromatous change about the left carotid bulb without stenosis. Vertebral arteries: Codominant. No evidence of dissection, stenosis (50% or greater), or occlusion. Skeleton: No worrisome osseous lesions. Other neck: No other acute finding. Upper chest: Moderate layering bilateral pleural effusions with mild pulmonary interstitial congestion. Review of the MIP images confirms the above findings CTA HEAD FINDINGS Anterior circulation: Mild atheromatous change about the carotid siphons bilaterally without hemodynamically significant stenosis. A1 segments patent bilaterally. Right A1 hypoplastic. Normal anterior communicating complex. Anterior cerebral arteries patent without significant stenosis. No M1 stenosis or occlusion. Distal MCA branches perfused and symmetric. Posterior circulation: Both V4 segments patent without significant stenosis. Both PICA patent. Basilar patent without stenosis. Superior cerebellar and posterior cerebral arteries patent bilaterally. Venous sinuses: Grossly patent allowing for timing the contrast bolus. Anatomic variants: None significant.  No aneurysm. Review of the MIP images confirms the above findings IMPRESSION: CT HEAD: 1. No acute intracranial abnormality. 2. Mild age-related cerebral atrophy with chronic small vessel ischemic disease. CTA HEAD AND NECK: 1. Negative CTA for large vessel occlusion or other emergent finding. 2. Mild atheromatous change about the carotid bifurcations and carotid siphons without hemodynamically significant stenosis. 3. Moderate layering bilateral pleural effusions with mild pulmonary interstitial congestion/edema. 4.  Aortic Atherosclerosis (ICD10-I70.0). Electronically Signed   By: [name] Hoard M.D.   On: 02/16/2024 19:07   DG Chest Port 1  View Result Date: 02/16/2024 CLINICAL DATA:  Sepsis. EXAM: PORTABLE CHEST 1 VIEW COMPARISON:  January 27, 2024.  FINDINGS: The heart size and mediastinal contours are within normal limits. Both lungs are clear. The visualized skeletal structures are unremarkable. IMPRESSION: No active disease. Electronically Signed   By: Lynwood Landy Raddle M.D.   On: 02/16/2024 13:03    Anti-infectives: Anti-infectives (From admission, onward)    Start     Dose/Rate Route Frequency Ordered Stop   02/17/24 2100  vancomycin  (VANCOREADY) IVPB 750 mg/150 mL        750 mg 150 mL/hr over 60 Minutes Intravenous Every 24 hours 02/16/24 2101     02/17/24 1800  ceFEPIme  (MAXIPIME ) 2 g in sodium chloride  0.9 % 100 mL IVPB        2 g 200 mL/hr over 30 Minutes Intravenous Every 8 hours 02/17/24 1739     02/17/24 0900  ceFEPIme  (MAXIPIME ) 2 g in sodium chloride  0.9 % 100 mL IVPB  Status:  Discontinued        2 g 200 mL/hr over 30 Minutes Intravenous Every 12 hours 02/16/24 2101 02/17/24 1739   02/16/24 1345  ceFEPIme  (MAXIPIME ) 2 g in sodium chloride  0.9 % 100 mL IVPB        2 g 200 mL/hr over 30 Minutes Intravenous  Once 02/16/24 1331 02/16/24 1436   02/16/24 1345  metroNIDAZOLE  (FLAGYL ) IVPB 500 mg        500 mg 100 mL/hr over 60 Minutes Intravenous  Once 02/16/24 1331 02/16/24 1541   02/16/24 1345  vancomycin  (VANCOCIN ) IVPB 1000 mg/200 mL premix        1,000 mg 200 mL/hr over 60 Minutes Intravenous  Once 02/16/24 1331 02/16/24 1709        Assessment/Plan Stage IV sacral decubitus ulcer -no evidence of soft tissue infection of this wound.   -can do bedside debridement of some of this eschar, but patient is not consentable at this time and unclear what family she has to consent for this.  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  She had enemas a couple of weeks ago to help move stool through to  limit stercoral colitis, so unclear if this is still present/left over from that or if this is new.  Either way, abx for osteo should likely cover this as well.  No acute surgical intervention warranted for this.   -santyl  daily to eschar and Dakin's BID to wound has already been ordered -cont local wound care   FEN - Na restricted diet VTE - heparin  ID - Cefipime/Vanc  Numerous medical problems  I reviewed hospitalist notes, last 24 h vitals and pain scores, last 48 h intake and output, last 24 h labs and trends, and last 24 h imaging results.   LOS: 2 days    Burnard FORBES Banter , [facility] Surgery 02/18/2024, 9:48 AM Please see Amion for pager number during day hours 7:00am-4:30pm or 7:00am -11:30am on weekends

## 2024-02-18 NOTE — Procedures (Signed)
 Arterial Catheter Insertion Procedure Note  [name] [name]  [id number]  12/15/1965  Date:02/18/24  Time:3:11 PM    Provider Performing: Benton D. Harris    Procedure: Insertion of Arterial Line (63379) with US  guidance (23062)   Indication(s) Blood pressure monitoring and/or need for frequent ABGs  Consent Unable to obtain consent due to emergent nature of procedure.  Anesthesia None   Time Out Verified patient identification, verified procedure, site/side was marked, verified correct patient position, special equipment/implants available, medications/allergies/relevant history reviewed, required imaging and test results available.   Sterile Technique Maximal sterile technique including full sterile barrier drape, hand hygiene, sterile gown, sterile gloves, mask, hair covering, sterile ultrasound probe cover (if used).   Procedure Description Area of catheter insertion was cleaned with chlorhexidine  and draped in sterile fashion. With real-time ultrasound guidance an arterial catheter was placed into the left brachial artery.  Appropriate arterial tracings confirmed on monitor.     Complications/Tolerance None; patient tolerated the procedure well.   EBL Minimal   Specimen(s) None   Sedona Wenk D. Harris, NP-C [facility] Personal contact information can be found on Amion  If no contact or response made please call [phone] 02/18/2024, 3:12 PM

## 2024-02-18 NOTE — Progress Notes (Signed)
 [facility] Progress Note  Subjective:    Vitals:   02/18/24 1030 02/18/24 1045 02/18/24 1100 02/18/24 1122  BP: 104/77 99/74 100/75   Pulse: 86 87    Resp: (!) 9 (!) 8 (!) 8   Temp:    98.1 F (36.7 C)  TempSrc:    Oral  SpO2: 100% 100%    Weight:      Height:        Exam: General: drowsy, chronically ill appearing Neck: without lymphadenopathy/masses Lungs: Clear bilaterally to auscultation Heart: RRR with normal S1, S2.  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  ESRD: usual HD is TTS. CRRT started 8/14, continue for now. Volume: no sig edema, under dry wt here 2-3kg. Changing to keep even  Anemia: Hgb 8-11 here, ESA due on 8/16. Will order darbe 100mcg weekly   Metabolic bone disease: patient not on phos binder; last PTH  656, phos 4.2, and Ca 8.0. On calcitriol  0.5 mcg TIW   Myer Fret MD  [facility] 02/18/2024, 2:21 PM  Recent Labs  Lab 02/17/24 1600 02/17/24 2129 02/18/24 0321 02/18/24 0322  HGB  --  8.6* 8.6*  --   ALBUMIN  3.3*  --   --  3.0*  CALCIUM  9.0  --  8.4* 8.4*  PHOS 1.9*  --  3.2 3.1  CREATININE 1.74*  --  1.20* 1.16*  K 3.6  --  4.2 4.0   No results for input(s): IRON , TIBC, FERRITIN in the last 168 hours. Inpatient medications:  calcitRIOL   0.5 mcg Oral Q T,Th,Sa-HD   Chlorhexidine  Gluconate Cloth  6 each Topical Daily   collagenase    Topical Daily    feeding supplement (KATE FARMS STANDARD 1.4)  325 mL Oral BID BM   gabapentin   100 mg Oral BID   heparin  injection (subcutaneous)  5,000 Units Subcutaneous Q8H   hydrocortisone  sod succinate (SOLU-CORTEF ) inj  100 mg Intravenous Q12H   insulin  aspart  0-9 Units Subcutaneous TID AC & HS   midodrine   10 mg Oral TID WC   multivitamin  1 tablet Oral QHS   mupirocin  ointment   Nasal BID   nutrition supplement (JUVEN)  1 packet Oral BID BM   sodium hypochlorite   Irrigation BID   vasopressin         amiodarone  15 mg/hr (02/18/24 1419)   anticoagulant sodium citrate      ceFEPime  (MAXIPIME ) IV Stopped (02/18/24 1036)   dextrose  5 % and 0.9 % NaCl 50 mL/hr at 02/18/24 1300   lactated ringers      magnesium  sulfate bolus IVPB     norepinephrine  (LEVOPHED ) Adult infusion 10 mcg/min (02/18/24 1300)   PrismaSol  BGK 2/3.5 1,500 mL/hr at 02/18/24 1306   prismasol  BGK 4/2.5 400 mL/hr at 02/18/24 1306   prismasol  BGK 4/2.5 400 mL/hr at 02/18/24 1306   vancomycin  Stopped (02/17/24 2211)  vasopressin      alteplase , anticoagulant sodium citrate , docusate sodium , heparin , heparin , heparin , HYDROmorphone  (DILAUDID ) injection, lidocaine  (PF), lidocaine -prilocaine , naLOXone  (NARCAN )  injection, ondansetron  (ZOFRAN ) IV, mouth rinse, oxyCODONE , pentafluoroprop-tetrafluoroeth, polyethylene glycol, sodium chloride , vasopressin 

## 2024-02-18 NOTE — IPAL (Addendum)
  Interdisciplinary Goals of Care Family Meeting   Date carried out:: 02/18/2024  Location of the meeting: Conference room  Member's involved: Physician, Bedside Registered Nurse, and Family Member or next of kin  Durable Power of Attorney or acting medical decision maker:     Discussion: We discussed goals of care for [name] [name] .  [name] [name] her daughter was present and son [name] [name] joined on speaker phone. There is an estranged husband who we are not able to contract.   I discussed her deteriorating status, worsening lactic acid, septic shock, multiorgan failure.  I suspect she will need to be intubated soon due to worsening mental status.  Told the family that her prognosis is poor due to multiple comorbidities, frailty and recommended DNR status and consideration of comfort measures.  The family wants to discuss among themselves and will get back to us .  In the meantime they have asked for her to be full code.  Code status: Full Code  Disposition: Continue current acute care   Time spent for the meeting: 20 mins  Aadhav Uhlig 02/18/2024, 4:14 PM

## 2024-02-18 NOTE — Progress Notes (Addendum)
 NAME:  [name] [name], MRN:  [id number], DOB:  1966-03-28, LOS: 2 ADMISSION DATE:  02/16/2024, CONSULTATION DATE:  02/16/24 REFERRING MD:  EDP, CHIEF COMPLAINT:  Vision issues   History of Present Illness:  58 year old female who comes in with history of ESRD on HD, prior L BKA, prior strokes, T2DM, HTN, and heart murmur for hypotension and vision changes that started 8/13 AM while she was riding back to her facility from her doctor's appointment.  Attempted to attend HD on 8/14, but was hypotensive and had an elevated CBG, therefore was sent to the ED.  Lactic acid elevated to 2.2 with hypotension.  Remained hypotensive s/p fluids and was started on broad-spectrum antibiotics (prior to obtaining blood cultures d/t difficulty) and pressors.  Likely secondary to his sacral wound, recently admitted for sacral osteomyelitis.  CT/CTA head demonstrated no acute intracranial abnormality, atheromatous change about the carotid bifurcations and carotid siphons, without significant stenosis and moderate bilateral pleural effusions with minimal pulmonary interstitial congestion/edema.  CTAP demonstrated moderately large open sacral wound in contact with the coccyx and distal sacrum with extensive erosion of the coccyx, moderate diffuse rectal wall thickening with perirectal soft tissue stranding, and presacral soft tissue thickening suggesting proctitis and osteomyelitis.  Small to moderate-sized pericardial effusion and small bilateral pleural effusions.  Diffuse intrahepatic periportal edema.  1.7 cm indeterminate mass noted in the medial 7 mm of the left lobe of the liver which needs further evaluation with pre and postcontrast MRI of the abdomen.  Nephrology was consulted and recommended CRRT, given K of 6.  Was found to be going in and out of A-fib with a stable blood pressure, correlated with EKG changes, likely new onset in the setting of shock.  Pertinent  Medical History  T2DM with neuropathy ESRD on HD  (TTS) L BKA CVA without residual deficits GERD HTN  Significant Hospital Events: Including procedures, antibiotic start and stop dates in addition to other pertinent events   8/14: Admitted to the ICU; CRRT and on Levo 8/15: Consulted wound and ortho; continue CRRT; continue Levo and inc Midodrine  to 10 mg TID  Interim History / Subjective:   Remains on Levophed  and CRRT.  Respiratory status is stable on room air Got 1 unit of blood yesterday Out of A-fib.  On amiodarone  drip.  Objective    Blood pressure 108/73, pulse 91, temperature 97.7 F (36.5 C), temperature source Oral, resp. rate 14, height 5' (1.524 m), weight 56.7 kg, SpO2 100%.        Intake/Output Summary (Last 24 hours) at 02/18/2024 0713 Last data filed at 02/18/2024 0700 Gross per 24 hour  Intake 2287.83 ml  Output 3314.7 ml  Net -1026.87 ml   Filed Weights   02/16/24 1900 02/17/24 0447 02/18/24 0213  Weight: 61.3 kg 62.3 kg 56.7 kg   Examination: Gen:      No acute distress HEENT:  EOMI, sclera anicteric Neck:     No masses; no thyromegaly Lungs:    Clear to auscultation bilaterally; normal respiratory effort CV:         Regular rate and rhythm; no murmurs Abd:      + bowel sounds; soft, non-tender; no palpable masses, no distension Ext:    Left BKA Neuro: alert and oriented x 3 Psych: normal mood and affect   Lab/imaging reviewed Significant for sodium 132, BUN/creatinine 13/1.16 WBC 21.9, hemoglobin 8.6, platelets 274 Blood culture with GPC and GNR     Resolved problem list   Assessment  and Plan  Shock 2/2 sacral osteomyelitis and proctitis POA  Stage IV Sacral Ulcer Remains on Levophed , hypotension has improved but waxes and wanes. MAPs stable.  MRSA nares positive. Echo demonstrates mild intracavitary gradient, LVEF 60 to 65%, no RWMA, G1DD Wean pressors as tolerated.  Continue midodrine  On cefepime , Vanco Follow blood cultures to completion Ortho following for BKA with dehiscence and  protruding hardware.  Will need to go to surgery next week. Will consult general surgery for sacral wound  Paroxysmal A-fib event in the setting of shock Continue amiodarone  drip, telemetry monitoring  Prior DVT on Eliquis  as outpatient Holding anticoagulation for now due to low hemoglobin.  Will make sure to blood counts are stable before resuming  Anemia of chronic disease No evidence of active bleed Status post 1 unit PRBC. Follow CBC  ESRD on HD Continue CRRT  T2DM Glucoses elevated upon admission, downtrending this a.m.  A1c 7.1. - Goal 140-180 - Sensitive SSI  Severe deconditioning  Bedbound  L BKA - Continue home gabapentin  100 mg BID - Oxycodone  5 mg every 4 hours as needed for severe pain  Acute on chronic vision issues HTN - Holding home medications of losartan , metoprolol  tartrate, and amlodipine  in the setting of shock.  Best Practice (right click and Reselect all SmartList Selections daily)   Diet/type: Renal diet w/ fluid restriction DVT prophylaxis prophylactic heparin   Pressure ulcer(s): present on admission  GI prophylaxis: N/A Lines: Dialysis Catheter Foley:  N/A Code Status:  full code Last date of multidisciplinary goals of care discussion [pending]  Critical care time:   The patient is critically ill with multiple organ system failure and requires high complexity decision making for assessment and support, frequent evaluation and titration of therapies, advanced monitoring, review of radiographic studies and interpretation of complex data.   Critical Care Time devoted to patient care services, exclusive of separately billable procedures, described in this note is 50 minutes.   Dyon Rotert MD [facility] See Amion for pager  If no response to pager , please call [phone] until 7pm After 7:00 pm call Elink  [phone] 02/18/2024, 7:14 AM

## 2024-02-19 ENCOUNTER — Inpatient Hospital Stay (HOSPITAL_COMMUNITY)

## 2024-02-19 LAB — CBC
HCT: 24.7 % — ABNORMAL LOW (ref 36.0–46.0)
Hemoglobin: 7.9 g/dL — ABNORMAL LOW (ref 12.0–15.0)
MCH: 24.4 pg — ABNORMAL LOW (ref 26.0–34.0)
MCHC: 32 g/dL (ref 30.0–36.0)
MCV: 76.2 fL — ABNORMAL LOW (ref 80.0–100.0)
Platelets: 175 K/uL (ref 150–400)
RBC: 3.24 MIL/uL — ABNORMAL LOW (ref 3.87–5.11)
RDW: 22.7 % — ABNORMAL HIGH (ref 11.5–15.5)
WBC: 20.8 K/uL — ABNORMAL HIGH (ref 4.0–10.5)
nRBC: 0.1 % (ref 0.0–0.2)

## 2024-02-19 LAB — LACTIC ACID, PLASMA
Lactic Acid, Venous: >9 mmol/L (ref 0.5–1.9)
Lactic Acid, Venous: >9 mmol/L (ref 0.5–1.9)

## 2024-02-19 LAB — TRIGLYCERIDES: Triglycerides: 87 mg/dL (ref <150)

## 2024-02-19 LAB — BASIC METABOLIC PANEL WITH GFR
Anion gap: 24 — ABNORMAL HIGH (ref 5–15)
BUN: 7 mg/dL (ref 6–20)
CO2: 8 mmol/L — ABNORMAL LOW (ref 22–32)
Calcium: 8.5 mg/dL — ABNORMAL LOW (ref 8.9–10.3)
Chloride: 102 mmol/L (ref 98–111)
Creatinine, Ser: 0.9 mg/dL (ref 0.44–1.00)
GFR, Estimated: >60 mL/min (ref >60)
Glucose, Bld: 79 mg/dL (ref 70–99)
Potassium: 3.6 mmol/L (ref 3.5–5.1)
Sodium: 134 mmol/L — ABNORMAL LOW (ref 135–145)

## 2024-02-19 LAB — PHOSPHORUS: Phosphorus: 2.9 mg/dL (ref 2.5–4.6)

## 2024-02-19 LAB — MAGNESIUM: Magnesium: 2 mg/dL (ref 1.7–2.4)

## 2024-02-19 MED ORDER — PHENYLEPHRINE HCL-NACL 20-0.9 MG/250ML-% IV SOLN
0.0000 ug/min | INTRAVENOUS | Status: DC
  Administered 2024-02-19: 20 ug/min via INTRAVENOUS
  Filled 2024-02-19 (×2): qty 250

## 2024-02-21 LAB — CULTURE, BLOOD (ROUTINE X 2)
Culture: NO GROWTH
Culture: NO GROWTH
Special Requests: ADEQUATE

## 2024-02-22 LAB — BODY FLUID CULTURE W GRAM STAIN: Gram Stain: NONE SEEN

## 2024-02-24 ENCOUNTER — Inpatient Hospital Stay (HOSPITAL_COMMUNITY): Admission: RE | Admit: 2024-02-24 | Source: Home / Self Care | Admitting: Orthopedic Surgery

## 2024-02-24 SURGERY — REVISION AMPUTATION, BELOW THE KNEE
Anesthesia: Choice | Laterality: Left

## 2024-03-05 NOTE — Progress Notes (Signed)
 Christian, NP at bedside talking with family d/t patient being maxed on 4 BP drips, on CRRT, and on the ventilator. Family wishes to make patient a DNR and to continue all current measures. Patient expired at 0530 on the vent. Verified by this RN and Olita Pizza, RN. RT turned of vent and all IV medications turned off. Crrt stopped, no blood was returned. All lines left in place until clarification obtained from ME. Medical Examiner confirmed that patient was not a ME case. Family given patient placement phone number bc they are uncertain about funeral home at this time. Will remove all tubing and lines when family leaves.

## 2024-03-05 NOTE — Death Summary Note (Signed)
  DEATH SUMMARY   Patient Details  Name: [name] [name] MRN: [id number] DOB: 1966/02/17  Admission/Discharge Information   Admit Date:  Mar 06, 2024  Date of Death: Date of Death: 2024-03-09  Time of Death: Time of Death: 0530  Length of Stay: 3  Referring Physician: Patient, No Pcp Per   Reason(s) for Hospitalization   Sepsis  Diagnoses  Preliminary cause of death:  Sepsis present on admission secondary to sacral ulcer, osteomyelitis Stage IV sacral ulcer present on admission Septic shock with lactic acidosis Multiorgan failure End-stage renal disease on dialysis Paroxysmal atrial fibrillation Anemia of chronic disease Type 2 diabetes Severe deconditioning, failure to thrive Uremic pericarditis Pericardial effusion Left BKA with partial dehiscence DNR Status  Secondary Diagnoses (including complications and co-morbidities):  Principal Problem:   Shock (HCC) Hypotension Acute on chronic vision issues CVA  Brief Hospital Course (including significant findings, care, treatment, and services provided and events leading to death)  [name] is a 58 y.o. year old female who presented with vision changes, weakness noted to be in severe sepsis with septic shock secondary to stage IV decubitus ulcer.  CT abdomen pelvis demonstrated moderately large open sacral wound in contact with the coccyx and distal sacrum with extensive erosion of the coccyx, moderate diffuse rectal wall thickening with perirectal soft tissue stranding, and presacral soft tissue thickening suggesting proctitis and osteomyelitis.  She was brought to the ICU and started on CRRT.  She remained in septic shock requiring pressors.  Orthopedics was consulted for dehiscence of left BKA and recommended revision amputation later in the week.  Surgery consulted to evaluate sacral wound and recommended bedside debridement.  Echocardiogram showed moderate effusion with fibrinous strands.  Cardiology was consulted felt this  is secondary to uremic pericarditis with no evidence of tamponade and did not recommend pericardiocentesis.  She continued to deteriorate with worsening shock, lactic acidosis, metabolic acidosis requiring multiple pressors, bicarb drip.  She was intubated for unresponsiveness.  Multiple family meetings were held and she was transitioned to DNR but continued to deteriorate and passed away on Mar 09, 2024  Signature:   Lolah Coghlan MD [facility] See Amion for pager  If no response to pager , please call [phone] until 7pm After 7:00 pm call Elink  [phone] 02/20/2024, 6:17 PM

## 2024-03-05 NOTE — Progress Notes (Addendum)
 Called by [facility] Team to come discuss clinical status with patient's daughter, who is at beside. Patient on 4 pressors, CRRT, and mechanical ventilation.    Interdisciplinary Goals of Care Family Meeting   Date carried out:: 06-Mar-2024  Location of the meeting: Bedside  Member's involved: Bedside Registered Nurse and Family Member or next of kin Daugther at beside- requesting to talk with someone from [employer] Power of Attorney or acting medical decision maker: Husband (estranged)   Discussion: We discussed goals of care for [name] [name] .  Discussed situation with daughter at beside. Unfortunately patient is in septic shock with Multisystem Organ Failure. At first daughter was having difficult time in understanding situation but now understands that patient is in multisystem organ failure and despite our aggressive interventions/measures- 4 vasopressors, CRRT and mechanical ventilation that patient's body is failing. Patient unfortunately is actively dying. Currently, daughter believes at this time that the patient would want everything done. Recommendations of change in code status have been made.   Code status: Full Code  Disposition: Continue current acute care  Once Husband is at beside, can come rediscuss GOC.  Continue current measures at this time.     Approximately at 0450   Notified by beside RN, patient maxed out on all 4 vasopressors. Ordered for additional Bicarb gtt to be given and increase bicarb rate to 165ml/hr until husband could get up to beside.   Husband came to beside now with second daughter, Rosina. Husband with daughters and other family members in agreement of DNR- no compressions or shocking patient. Husband, daughters, and family understand that is actively dying and more than likely will die in the next hour or so.  P: Will place DNR orders  If husband and family would like to transition to comfort measures, notify Elink or ground team  and will place orders to transition to comfort.    Time spent for the meeting: 40 mins  Christian Nash Bolls AGACNP-BC   [facility] 03/06/2024, 4:19 AM  Please see [email or web address] for pager details.  From 7A-7P if no response, please call [phone]. After hours, please call ELink [phone].

## 2024-03-05 NOTE — Progress Notes (Signed)
 Chaplain offered ministry of presence to family at the time of death.  Chaplain spoke with husband who told the story of how his daughters and his wife's sisters had helped care for her during her lengthy illness.  Chaplain assured husband it was ok to go home and think about how to proceed with funeral arrangements.   [place] Chaplain

## 2024-03-05 NOTE — Progress Notes (Signed)
 Family called to bedside d/t increasing pressor requirements and 4 BP drips. Daughter, Harlene is at bedside. Spouse, Odis called and he is on his way along with daughter, Rosina. Rosina informed me that she will be calling her brother, Selinda. Chaplain has been consulted. Ongoing support is being provided to the patient and family as they need it.

## 2024-03-05 NOTE — Progress Notes (Signed)
 Late Note Entry- February 20, 2024  Contacted [facility] this morning to be advised of pt's passing.   Randine Mungo Dialysis Navigator [phone]

## 2024-03-05 NOTE — Progress Notes (Signed)
 Acute hypoxic respiratory failure due to sepsis

## 2024-03-05 DEATH — deceased
